# Patient Record
Sex: Female | Born: 1945 | Race: Black or African American | Hispanic: No | Smoking: Never smoker
Health system: Southern US, Community
[De-identification: ages and names within clinical notes are randomized; demographics above are authoritative.]

## PROBLEM LIST (undated history)

## (undated) DIAGNOSIS — E119 Type 2 diabetes mellitus without complications: Secondary | ICD-10-CM

## (undated) DIAGNOSIS — R911 Solitary pulmonary nodule: Secondary | ICD-10-CM

## (undated) DIAGNOSIS — M1990 Unspecified osteoarthritis, unspecified site: Secondary | ICD-10-CM

## (undated) DIAGNOSIS — E785 Hyperlipidemia, unspecified: Secondary | ICD-10-CM

## (undated) DIAGNOSIS — F419 Anxiety disorder, unspecified: Secondary | ICD-10-CM

## (undated) DIAGNOSIS — I493 Ventricular premature depolarization: Secondary | ICD-10-CM

## (undated) DIAGNOSIS — I5042 Chronic combined systolic (congestive) and diastolic (congestive) heart failure: Secondary | ICD-10-CM

## (undated) DIAGNOSIS — I10 Essential (primary) hypertension: Secondary | ICD-10-CM

## (undated) DIAGNOSIS — E669 Obesity, unspecified: Secondary | ICD-10-CM

## (undated) DIAGNOSIS — I350 Nonrheumatic aortic (valve) stenosis: Secondary | ICD-10-CM

## (undated) DIAGNOSIS — F329 Major depressive disorder, single episode, unspecified: Secondary | ICD-10-CM

## (undated) DIAGNOSIS — K5900 Constipation, unspecified: Secondary | ICD-10-CM

## (undated) DIAGNOSIS — N183 Chronic kidney disease, stage 3 unspecified: Secondary | ICD-10-CM

## (undated) DIAGNOSIS — I428 Other cardiomyopathies: Secondary | ICD-10-CM

## (undated) DIAGNOSIS — F32A Depression, unspecified: Secondary | ICD-10-CM

## (undated) DIAGNOSIS — I447 Left bundle-branch block, unspecified: Secondary | ICD-10-CM

## (undated) DIAGNOSIS — J189 Pneumonia, unspecified organism: Secondary | ICD-10-CM

## (undated) DIAGNOSIS — Z95810 Presence of automatic (implantable) cardiac defibrillator: Secondary | ICD-10-CM

## (undated) DIAGNOSIS — I5022 Chronic systolic (congestive) heart failure: Secondary | ICD-10-CM

## (undated) DIAGNOSIS — Z952 Presence of prosthetic heart valve: Secondary | ICD-10-CM

## (undated) HISTORY — DX: Chronic combined systolic (congestive) and diastolic (congestive) heart failure: I50.42

## (undated) HISTORY — DX: Essential (primary) hypertension: I10

## (undated) HISTORY — DX: Anxiety disorder, unspecified: F41.9

## (undated) HISTORY — DX: Solitary pulmonary nodule: R91.1

## (undated) HISTORY — DX: Presence of automatic (implantable) cardiac defibrillator: Z95.810

## (undated) HISTORY — DX: Obesity, unspecified: E66.9

## (undated) HISTORY — DX: Nonrheumatic aortic (valve) stenosis: I35.0

## (undated) HISTORY — DX: Left bundle-branch block, unspecified: I44.7

## (undated) HISTORY — DX: Hyperlipidemia, unspecified: E78.5

## (undated) HISTORY — DX: Other cardiomyopathies: I42.8

## (undated) HISTORY — DX: Chronic systolic (congestive) heart failure: I50.22

## (undated) HISTORY — DX: Type 2 diabetes mellitus without complications: E11.9

## (undated) HISTORY — DX: Chronic kidney disease, stage 3 unspecified: N18.30

## (undated) HISTORY — DX: Ventricular premature depolarization: I49.3

## (undated) HISTORY — DX: Chronic kidney disease, stage 3 (moderate): N18.3

## (undated) HISTORY — DX: Pneumonia, unspecified organism: J18.9

## (undated) HISTORY — PX: CARDIAC CATHETERIZATION: SHX172

## (undated) SURGERY — VIDEO BRONCHOSCOPY WITHOUT FLUORO
Anesthesia: Moderate Sedation

---

## 1985-08-15 HISTORY — PX: TUBAL LIGATION: SHX77

## 2002-03-07 ENCOUNTER — Emergency Department (HOSPITAL_COMMUNITY): Admission: EM | Admit: 2002-03-07 | Discharge: 2002-03-07 | Payer: Self-pay | Admitting: Emergency Medicine

## 2002-03-21 ENCOUNTER — Encounter: Payer: Self-pay | Admitting: Family Medicine

## 2002-03-21 ENCOUNTER — Ambulatory Visit (HOSPITAL_COMMUNITY): Admission: RE | Admit: 2002-03-21 | Discharge: 2002-03-21 | Payer: Self-pay | Admitting: Family Medicine

## 2003-05-02 ENCOUNTER — Ambulatory Visit (HOSPITAL_COMMUNITY): Admission: RE | Admit: 2003-05-02 | Discharge: 2003-05-02 | Payer: Self-pay | Admitting: Family Medicine

## 2003-05-02 ENCOUNTER — Encounter: Payer: Self-pay | Admitting: Family Medicine

## 2003-05-05 ENCOUNTER — Encounter: Payer: Self-pay | Admitting: Family Medicine

## 2003-05-05 ENCOUNTER — Ambulatory Visit (HOSPITAL_COMMUNITY): Admission: RE | Admit: 2003-05-05 | Discharge: 2003-05-05 | Payer: Self-pay | Admitting: Family Medicine

## 2003-05-18 ENCOUNTER — Encounter: Payer: Self-pay | Admitting: Emergency Medicine

## 2003-05-18 ENCOUNTER — Emergency Department (HOSPITAL_COMMUNITY): Admission: EM | Admit: 2003-05-18 | Discharge: 2003-05-18 | Payer: Self-pay | Admitting: Emergency Medicine

## 2003-05-27 ENCOUNTER — Encounter: Payer: Self-pay | Admitting: *Deleted

## 2003-05-27 ENCOUNTER — Ambulatory Visit (HOSPITAL_COMMUNITY): Admission: RE | Admit: 2003-05-27 | Discharge: 2003-05-27 | Payer: Self-pay | Admitting: *Deleted

## 2004-07-27 ENCOUNTER — Ambulatory Visit: Payer: Self-pay | Admitting: Family Medicine

## 2004-09-27 ENCOUNTER — Ambulatory Visit: Payer: Self-pay | Admitting: Family Medicine

## 2004-09-28 ENCOUNTER — Ambulatory Visit (HOSPITAL_COMMUNITY): Admission: RE | Admit: 2004-09-28 | Discharge: 2004-09-28 | Payer: Self-pay | Admitting: Family Medicine

## 2004-11-08 ENCOUNTER — Ambulatory Visit: Payer: Self-pay | Admitting: Family Medicine

## 2004-11-10 ENCOUNTER — Ambulatory Visit: Payer: Self-pay | Admitting: *Deleted

## 2004-11-15 ENCOUNTER — Ambulatory Visit (HOSPITAL_COMMUNITY): Admission: RE | Admit: 2004-11-15 | Discharge: 2004-11-15 | Payer: Self-pay | Admitting: *Deleted

## 2004-11-15 ENCOUNTER — Ambulatory Visit: Payer: Self-pay | Admitting: *Deleted

## 2004-11-24 ENCOUNTER — Ambulatory Visit: Payer: Self-pay | Admitting: *Deleted

## 2005-01-26 ENCOUNTER — Encounter: Payer: Self-pay | Admitting: Orthopedic Surgery

## 2005-02-25 ENCOUNTER — Ambulatory Visit (HOSPITAL_COMMUNITY): Admission: RE | Admit: 2005-02-25 | Discharge: 2005-02-25 | Payer: Self-pay | Admitting: Family Medicine

## 2005-02-25 ENCOUNTER — Ambulatory Visit: Payer: Self-pay | Admitting: Family Medicine

## 2005-06-21 ENCOUNTER — Ambulatory Visit: Payer: Self-pay | Admitting: Family Medicine

## 2005-08-18 ENCOUNTER — Ambulatory Visit: Payer: Self-pay | Admitting: Family Medicine

## 2006-01-10 ENCOUNTER — Ambulatory Visit: Payer: Self-pay | Admitting: Family Medicine

## 2006-02-12 ENCOUNTER — Encounter: Payer: Self-pay | Admitting: Family Medicine

## 2006-02-12 LAB — CONVERTED CEMR LAB: Pap Smear: NORMAL

## 2006-02-24 ENCOUNTER — Ambulatory Visit (HOSPITAL_COMMUNITY): Admission: RE | Admit: 2006-02-24 | Discharge: 2006-02-24 | Payer: Self-pay | Admitting: Family Medicine

## 2006-03-06 ENCOUNTER — Ambulatory Visit: Payer: Self-pay | Admitting: Family Medicine

## 2006-03-06 ENCOUNTER — Other Ambulatory Visit: Admission: RE | Admit: 2006-03-06 | Discharge: 2006-03-06 | Payer: Self-pay | Admitting: Family Medicine

## 2006-03-06 ENCOUNTER — Encounter: Payer: Self-pay | Admitting: Family Medicine

## 2006-05-03 ENCOUNTER — Ambulatory Visit (HOSPITAL_COMMUNITY): Admission: RE | Admit: 2006-05-03 | Discharge: 2006-05-03 | Payer: Self-pay | Admitting: Family Medicine

## 2006-06-06 ENCOUNTER — Ambulatory Visit: Payer: Self-pay | Admitting: Family Medicine

## 2006-08-22 ENCOUNTER — Ambulatory Visit: Payer: Self-pay | Admitting: Family Medicine

## 2007-01-29 ENCOUNTER — Ambulatory Visit: Payer: Self-pay | Admitting: Family Medicine

## 2007-06-27 ENCOUNTER — Ambulatory Visit: Payer: Self-pay | Admitting: Cardiovascular Disease

## 2007-08-01 ENCOUNTER — Ambulatory Visit: Payer: Self-pay | Admitting: Family Medicine

## 2007-08-13 ENCOUNTER — Ambulatory Visit (HOSPITAL_COMMUNITY): Admission: RE | Admit: 2007-08-13 | Discharge: 2007-08-13 | Payer: Self-pay | Admitting: Family Medicine

## 2007-08-13 ENCOUNTER — Ambulatory Visit: Payer: Self-pay | Admitting: Cardiovascular Disease

## 2007-08-13 ENCOUNTER — Encounter: Payer: Self-pay | Admitting: Family Medicine

## 2007-08-13 LAB — CONVERTED CEMR LAB
ALT: 15 units/L (ref 0–35)
AST: 17 units/L (ref 0–37)
Albumin: 4.4 g/dL (ref 3.5–5.2)
Alkaline Phosphatase: 61 units/L (ref 39–117)
BUN: 15 mg/dL (ref 6–23)
Bilirubin, Direct: <0.1 mg/dL (ref 0.0–0.3)
CO2: 23 meq/L (ref 19–32)
Calcium: 9.9 mg/dL (ref 8.4–10.5)
Chloride: 105 meq/L (ref 96–112)
Cholesterol: 301 mg/dL — ABNORMAL HIGH (ref 0–200)
Creatinine, Ser: 0.81 mg/dL (ref 0.40–1.20)
Glucose, Bld: 135 mg/dL — ABNORMAL HIGH (ref 70–99)
HDL: 54 mg/dL (ref >39)
LDL Cholesterol: 220 mg/dL — ABNORMAL HIGH (ref 0–99)
Microalb, Ur: 1.65 mg/dL (ref 0.00–1.89)
Potassium: 4.5 meq/L (ref 3.5–5.3)
Sodium: 143 meq/L (ref 135–145)
Total Bilirubin: 0.3 mg/dL (ref 0.3–1.2)
Total CHOL/HDL Ratio: 5.6
Total Protein: 7.6 g/dL (ref 6.0–8.3)
Triglycerides: 134 mg/dL (ref <150)
VLDL: 27 mg/dL (ref 0–40)

## 2007-08-20 ENCOUNTER — Ambulatory Visit: Payer: Self-pay | Admitting: Cardiology

## 2007-08-21 ENCOUNTER — Ambulatory Visit (HOSPITAL_COMMUNITY): Admission: RE | Admit: 2007-08-21 | Discharge: 2007-08-21 | Payer: Self-pay | Admitting: Cardiovascular Disease

## 2007-09-18 ENCOUNTER — Ambulatory Visit: Payer: Self-pay | Admitting: Family Medicine

## 2007-10-24 ENCOUNTER — Encounter: Payer: Self-pay | Admitting: Family Medicine

## 2007-10-24 DIAGNOSIS — E1121 Type 2 diabetes mellitus with diabetic nephropathy: Secondary | ICD-10-CM | POA: Insufficient documentation

## 2007-10-24 DIAGNOSIS — E669 Obesity, unspecified: Secondary | ICD-10-CM | POA: Insufficient documentation

## 2007-10-24 DIAGNOSIS — M1710 Unilateral primary osteoarthritis, unspecified knee: Secondary | ICD-10-CM

## 2007-10-24 DIAGNOSIS — E663 Overweight: Secondary | ICD-10-CM | POA: Insufficient documentation

## 2007-10-24 DIAGNOSIS — E119 Type 2 diabetes mellitus without complications: Secondary | ICD-10-CM | POA: Insufficient documentation

## 2007-10-24 DIAGNOSIS — IMO0002 Reserved for concepts with insufficient information to code with codable children: Secondary | ICD-10-CM | POA: Insufficient documentation

## 2007-10-24 DIAGNOSIS — Z6826 Body mass index (BMI) 26.0-26.9, adult: Secondary | ICD-10-CM | POA: Insufficient documentation

## 2007-11-06 ENCOUNTER — Ambulatory Visit: Payer: Self-pay | Admitting: Family Medicine

## 2007-11-13 ENCOUNTER — Ambulatory Visit: Payer: Self-pay | Admitting: Orthopedic Surgery

## 2007-11-13 DIAGNOSIS — M25569 Pain in unspecified knee: Secondary | ICD-10-CM | POA: Insufficient documentation

## 2007-12-26 ENCOUNTER — Encounter: Payer: Self-pay | Admitting: Family Medicine

## 2007-12-26 ENCOUNTER — Ambulatory Visit: Payer: Self-pay | Admitting: Family Medicine

## 2007-12-26 ENCOUNTER — Other Ambulatory Visit: Admission: RE | Admit: 2007-12-26 | Discharge: 2007-12-26 | Payer: Self-pay | Admitting: Family Medicine

## 2007-12-27 ENCOUNTER — Encounter: Payer: Self-pay | Admitting: Family Medicine

## 2007-12-27 LAB — CONVERTED CEMR LAB
ALT: 13 units/L (ref 0–35)
AST: 12 units/L (ref 0–37)
Albumin: 4.3 g/dL (ref 3.5–5.2)
Alkaline Phosphatase: 61 units/L (ref 39–117)
BUN: 13 mg/dL (ref 6–23)
Bilirubin, Direct: 0.1 mg/dL (ref 0.0–0.3)
CO2: 23 meq/L (ref 19–32)
Calcium: 9.4 mg/dL (ref 8.4–10.5)
Chloride: 106 meq/L (ref 96–112)
Cholesterol: 187 mg/dL (ref 0–200)
Creatinine, Ser: 0.68 mg/dL (ref 0.40–1.20)
Glucose, Bld: 143 mg/dL — ABNORMAL HIGH (ref 70–99)
HDL: 50 mg/dL (ref >39)
Indirect Bilirubin: 0.2 mg/dL (ref 0.0–0.9)
LDL Cholesterol: 113 mg/dL — ABNORMAL HIGH (ref 0–99)
Potassium: 4.1 meq/L (ref 3.5–5.3)
Sodium: 141 meq/L (ref 135–145)
Total Bilirubin: 0.3 mg/dL (ref 0.3–1.2)
Total CHOL/HDL Ratio: 3.7
Total Protein: 7.4 g/dL (ref 6.0–8.3)
Triglycerides: 119 mg/dL (ref <150)
VLDL: 24 mg/dL (ref 0–40)

## 2008-03-27 ENCOUNTER — Telehealth: Payer: Self-pay | Admitting: Family Medicine

## 2008-05-22 ENCOUNTER — Ambulatory Visit: Payer: Self-pay | Admitting: Family Medicine

## 2008-05-22 LAB — CONVERTED CEMR LAB
Glucose, Bld: 151 mg/dL
Hgb A1c MFr Bld: 9.2 %

## 2008-05-29 ENCOUNTER — Encounter: Payer: Self-pay | Admitting: Family Medicine

## 2008-07-02 ENCOUNTER — Emergency Department (HOSPITAL_COMMUNITY): Admission: EM | Admit: 2008-07-02 | Discharge: 2008-07-02 | Payer: Self-pay | Admitting: Emergency Medicine

## 2008-07-11 ENCOUNTER — Ambulatory Visit: Payer: Self-pay | Admitting: Cardiology

## 2008-07-14 ENCOUNTER — Encounter: Payer: Self-pay | Admitting: Family Medicine

## 2008-07-22 ENCOUNTER — Telehealth: Payer: Self-pay | Admitting: Family Medicine

## 2008-07-23 ENCOUNTER — Encounter: Payer: Self-pay | Admitting: Family Medicine

## 2008-08-27 ENCOUNTER — Telehealth: Payer: Self-pay | Admitting: Family Medicine

## 2008-11-07 ENCOUNTER — Encounter: Payer: Self-pay | Admitting: Orthopedic Surgery

## 2008-12-03 ENCOUNTER — Ambulatory Visit: Payer: Self-pay | Admitting: Family Medicine

## 2008-12-03 LAB — CONVERTED CEMR LAB
Glucose, Bld: 142 mg/dL
Hgb A1c MFr Bld: 7.1 %

## 2008-12-04 ENCOUNTER — Encounter: Payer: Self-pay | Admitting: Family Medicine

## 2008-12-10 ENCOUNTER — Encounter: Payer: Self-pay | Admitting: Family Medicine

## 2009-01-20 ENCOUNTER — Encounter: Payer: Self-pay | Admitting: Family Medicine

## 2009-03-19 ENCOUNTER — Encounter: Payer: Self-pay | Admitting: Family Medicine

## 2009-03-24 ENCOUNTER — Encounter: Payer: Self-pay | Admitting: Family Medicine

## 2009-03-25 ENCOUNTER — Encounter: Payer: Self-pay | Admitting: Family Medicine

## 2009-05-08 ENCOUNTER — Telehealth: Payer: Self-pay | Admitting: Family Medicine

## 2009-05-13 ENCOUNTER — Ambulatory Visit: Payer: Self-pay | Admitting: Cardiology

## 2009-05-13 DIAGNOSIS — R002 Palpitations: Secondary | ICD-10-CM | POA: Insufficient documentation

## 2009-05-20 ENCOUNTER — Encounter: Payer: Self-pay | Admitting: Family Medicine

## 2009-05-27 ENCOUNTER — Encounter: Payer: Self-pay | Admitting: Family Medicine

## 2009-05-28 ENCOUNTER — Ambulatory Visit: Payer: Self-pay | Admitting: Family Medicine

## 2009-05-28 LAB — CONVERTED CEMR LAB
ALT: 14 units/L (ref 0–35)
AST: 15 units/L (ref 0–37)
Albumin: 4.3 g/dL (ref 3.5–5.2)
Alkaline Phosphatase: 45 units/L (ref 39–117)
BUN: 14 mg/dL (ref 6–23)
Basophils Absolute: 0.1 10*3/uL (ref 0.0–0.1)
Basophils Relative: 1 % (ref 0–1)
Bilirubin, Direct: 0.1 mg/dL (ref 0.0–0.3)
CO2: 26 meq/L (ref 19–32)
Calcium: 9.6 mg/dL (ref 8.4–10.5)
Chloride: 105 meq/L (ref 96–112)
Cholesterol: 182 mg/dL (ref 0–200)
Creatinine, Ser: 0.71 mg/dL (ref 0.40–1.20)
Creatinine, Urine: 52.4 mg/dL
Eosinophils Absolute: 0.2 10*3/uL (ref 0.0–0.7)
Eosinophils Relative: 3 % (ref 0–5)
Glucose, Bld: 105 mg/dL — ABNORMAL HIGH (ref 70–99)
Glucose, Bld: 92 mg/dL
HCT: 34.9 % — ABNORMAL LOW (ref 36.0–46.0)
HDL: 60 mg/dL (ref >39)
Hemoglobin: 10.8 g/dL — ABNORMAL LOW (ref 12.0–15.0)
Hgb A1c MFr Bld: 6.8 %
Indirect Bilirubin: 0.1 mg/dL (ref 0.0–0.9)
LDL Cholesterol: 104 mg/dL — ABNORMAL HIGH (ref 0–99)
Lymphocytes Relative: 40 % (ref 12–46)
Lymphs Abs: 2.2 10*3/uL (ref 0.7–4.0)
MCHC: 30.9 g/dL (ref 30.0–36.0)
MCV: 85.7 fL (ref 78.0–100.0)
Microalb Creat Ratio: 32.1 mg/g — ABNORMAL HIGH (ref 0.0–30.0)
Microalb, Ur: 1.68 mg/dL (ref 0.00–1.89)
Monocytes Absolute: 0.5 10*3/uL (ref 0.1–1.0)
Monocytes Relative: 9 % (ref 3–12)
Neutro Abs: 2.6 10*3/uL (ref 1.7–7.7)
Neutrophils Relative %: 47 % (ref 43–77)
Platelets: 276 10*3/uL (ref 150–400)
Potassium: 4.1 meq/L (ref 3.5–5.3)
RBC: 4.07 M/uL (ref 3.87–5.11)
RDW: 16.3 % — ABNORMAL HIGH (ref 11.5–15.5)
Sodium: 141 meq/L (ref 135–145)
Total Bilirubin: 0.2 mg/dL — ABNORMAL LOW (ref 0.3–1.2)
Total CHOL/HDL Ratio: 3
Total Protein: 7.3 g/dL (ref 6.0–8.3)
Triglycerides: 90 mg/dL (ref <150)
VLDL: 18 mg/dL (ref 0–40)
WBC: 5.6 10*3/uL (ref 4.0–10.5)

## 2009-05-29 ENCOUNTER — Encounter: Payer: Self-pay | Admitting: Family Medicine

## 2009-06-01 ENCOUNTER — Encounter: Payer: Self-pay | Admitting: Family Medicine

## 2009-06-05 ENCOUNTER — Encounter: Payer: Self-pay | Admitting: Family Medicine

## 2009-06-15 ENCOUNTER — Encounter: Payer: Self-pay | Admitting: Family Medicine

## 2009-06-17 ENCOUNTER — Telehealth: Payer: Self-pay | Admitting: Family Medicine

## 2009-06-19 ENCOUNTER — Telehealth: Payer: Self-pay | Admitting: Family Medicine

## 2009-08-05 ENCOUNTER — Encounter: Payer: Self-pay | Admitting: Family Medicine

## 2009-08-12 ENCOUNTER — Encounter: Payer: Self-pay | Admitting: Family Medicine

## 2009-09-09 ENCOUNTER — Encounter: Payer: Self-pay | Admitting: Family Medicine

## 2009-10-29 ENCOUNTER — Encounter: Payer: Self-pay | Admitting: Family Medicine

## 2009-12-21 ENCOUNTER — Telehealth: Payer: Self-pay | Admitting: Orthopedic Surgery

## 2010-03-01 ENCOUNTER — Encounter: Payer: Self-pay | Admitting: Family Medicine

## 2010-03-02 ENCOUNTER — Encounter: Payer: Self-pay | Admitting: Family Medicine

## 2010-03-09 ENCOUNTER — Encounter: Payer: Self-pay | Admitting: Family Medicine

## 2010-03-23 ENCOUNTER — Encounter: Payer: Self-pay | Admitting: Family Medicine

## 2010-04-05 ENCOUNTER — Encounter: Payer: Self-pay | Admitting: Family Medicine

## 2010-04-06 ENCOUNTER — Telehealth: Payer: Self-pay | Admitting: Family Medicine

## 2010-04-28 ENCOUNTER — Ambulatory Visit: Payer: Self-pay | Admitting: Family Medicine

## 2010-04-28 DIAGNOSIS — R5381 Other malaise: Secondary | ICD-10-CM | POA: Insufficient documentation

## 2010-04-28 DIAGNOSIS — R5383 Other fatigue: Secondary | ICD-10-CM

## 2010-05-02 DIAGNOSIS — F3289 Other specified depressive episodes: Secondary | ICD-10-CM | POA: Insufficient documentation

## 2010-05-02 DIAGNOSIS — F329 Major depressive disorder, single episode, unspecified: Secondary | ICD-10-CM | POA: Insufficient documentation

## 2010-05-07 ENCOUNTER — Encounter: Payer: Self-pay | Admitting: Family Medicine

## 2010-05-26 ENCOUNTER — Telehealth: Payer: Self-pay | Admitting: Family Medicine

## 2010-05-28 LAB — CONVERTED CEMR LAB
ALT: 13 units/L (ref 0–35)
AST: 11 units/L (ref 0–37)
Albumin: 4.5 g/dL (ref 3.5–5.2)
Alkaline Phosphatase: 50 units/L (ref 39–117)
BUN: 20 mg/dL (ref 6–23)
Basophils Absolute: 0.1 10*3/uL (ref 0.0–0.1)
Basophils Relative: 1 % (ref 0–1)
Bilirubin, Direct: 0.1 mg/dL (ref 0.0–0.3)
CO2: 25 meq/L (ref 19–32)
Calcium: 10.3 mg/dL (ref 8.4–10.5)
Chloride: 101 meq/L (ref 96–112)
Cholesterol: 150 mg/dL (ref 0–200)
Creatinine, Ser: 0.87 mg/dL (ref 0.40–1.20)
Eosinophils Absolute: 0.1 10*3/uL (ref 0.0–0.7)
Eosinophils Relative: 2 % (ref 0–5)
Glucose, Bld: 163 mg/dL — ABNORMAL HIGH (ref 70–99)
HCT: 37.5 % (ref 36.0–46.0)
HDL: 38 mg/dL — ABNORMAL LOW (ref >39)
Hemoglobin: 11.8 g/dL — ABNORMAL LOW (ref 12.0–15.0)
Hgb A1c MFr Bld: 9 % — ABNORMAL HIGH (ref <5.7)
Indirect Bilirubin: 0.3 mg/dL (ref 0.0–0.9)
LDL Cholesterol: 85 mg/dL (ref 0–99)
Lymphocytes Relative: 40 % (ref 12–46)
Lymphs Abs: 2.1 10*3/uL (ref 0.7–4.0)
MCHC: 31.5 g/dL (ref 30.0–36.0)
MCV: 78.8 fL (ref 78.0–100.0)
Monocytes Absolute: 0.5 10*3/uL (ref 0.1–1.0)
Monocytes Relative: 9 % (ref 3–12)
Neutro Abs: 2.5 10*3/uL (ref 1.7–7.7)
Neutrophils Relative %: 48 % (ref 43–77)
Platelets: 306 10*3/uL (ref 150–400)
Potassium: 4.1 meq/L (ref 3.5–5.3)
RBC: 4.76 M/uL (ref 3.87–5.11)
RDW: 17.1 % — ABNORMAL HIGH (ref 11.5–15.5)
Sodium: 140 meq/L (ref 135–145)
TSH: 2.632 microintl units/mL (ref 0.350–4.500)
Total Bilirubin: 0.4 mg/dL (ref 0.3–1.2)
Total CHOL/HDL Ratio: 3.9
Total Protein: 7.8 g/dL (ref 6.0–8.3)
Triglycerides: 134 mg/dL (ref <150)
VLDL: 27 mg/dL (ref 0–40)
WBC: 5.3 10*3/uL (ref 4.0–10.5)

## 2010-06-01 ENCOUNTER — Ambulatory Visit (HOSPITAL_COMMUNITY): Admission: RE | Admit: 2010-06-01 | Discharge: 2010-06-01 | Payer: Self-pay | Admitting: Family Medicine

## 2010-06-15 ENCOUNTER — Other Ambulatory Visit: Admission: RE | Admit: 2010-06-15 | Discharge: 2010-06-15 | Payer: Self-pay | Admitting: Family Medicine

## 2010-06-15 ENCOUNTER — Ambulatory Visit: Payer: Self-pay | Admitting: Family Medicine

## 2010-06-15 DIAGNOSIS — H05019 Cellulitis of unspecified orbit: Secondary | ICD-10-CM | POA: Insufficient documentation

## 2010-06-15 LAB — HM PAP SMEAR

## 2010-06-15 LAB — HM DIABETES FOOT EXAM

## 2010-06-15 LAB — CONVERTED CEMR LAB: OCCULT 1: NEGATIVE

## 2010-06-18 ENCOUNTER — Encounter: Payer: Self-pay | Admitting: Family Medicine

## 2010-06-18 LAB — CONVERTED CEMR LAB: Pap Smear: NEGATIVE

## 2010-06-24 ENCOUNTER — Telehealth (INDEPENDENT_AMBULATORY_CARE_PROVIDER_SITE_OTHER): Payer: Self-pay | Admitting: *Deleted

## 2010-06-27 ENCOUNTER — Telehealth: Payer: Self-pay | Admitting: Family Medicine

## 2010-07-01 ENCOUNTER — Encounter (INDEPENDENT_AMBULATORY_CARE_PROVIDER_SITE_OTHER): Payer: Self-pay | Admitting: *Deleted

## 2010-07-16 ENCOUNTER — Emergency Department (HOSPITAL_COMMUNITY)
Admission: EM | Admit: 2010-07-16 | Discharge: 2010-07-16 | Payer: Self-pay | Source: Home / Self Care | Admitting: Emergency Medicine

## 2010-08-25 ENCOUNTER — Encounter: Payer: Self-pay | Admitting: Family Medicine

## 2010-09-04 ENCOUNTER — Encounter: Payer: Self-pay | Admitting: Family Medicine

## 2010-09-05 ENCOUNTER — Encounter: Payer: Self-pay | Admitting: Family Medicine

## 2010-09-05 ENCOUNTER — Encounter: Payer: Self-pay | Admitting: Cardiovascular Disease

## 2010-09-06 ENCOUNTER — Encounter: Payer: Self-pay | Admitting: Family Medicine

## 2010-09-14 NOTE — Letter (Signed)
Summary: rx assistance  rx assistance   Imported By: Dierdre Harness 03/02/2010 11:00:35  _____________________________________________________________________  External Attachment:    Type:   Image     Comment:   External Document

## 2010-09-14 NOTE — Letter (Signed)
Summary: Appointment - Missed  [facility] [place]. [address] [place] [address]   Phone: [phone]  Fax: [phone]     July 01, 2010 MRN: [id number]   [place], [place]  [address]   Dear Ms. Finan,  [facility] records indicate you missed your appointment on       07/01/10 Jory Sims NP          It is very important that we reach you to reschedule this appointment. We look forward to participating in your health care needs. Please contact us at the number listed above at your earliest convenience to reschedule this appointment.     Sincerely,    Public relations account executive

## 2010-09-14 NOTE — Assessment & Plan Note (Signed)
Summary: per dr diabetic   Vital Signs:  Patient profile:   65 year old female Menstrual status:  postmenopausal Height:      65 inches Weight:      210.50 pounds BMI:     35.16 O2 Sat:      97 % Pulse rate:   72 / minute Pulse rhythm:   regular Resp:     16 per minute BP sitting:   150 / 90  (left arm) Cuff size:   large  Vitals Entered By: Kate Sable LPN (September 14, 624THL 7:58 AM)  Nutrition Counseling: Patient's BMI is greater than 25 and therefore counseled on weight management options. CC: Follow up chronic problems   Primary Care Provider:  Dr.Margaret Moshe Cipro  CC:  Follow up chronic problems.  History of Present Illness: pt reports she has not been well. She ius under alot of stress from family issues and her husband's continud ill health. She denies any recenrt fever or chills or head or chest congestion, dysuria or frequency. She is experiencing a sense of being overwhelmed, and feels depressed and is often tearful.She is not suicidal or homicidal and is not hallucinating   Current Medications (verified): 1)  Metoprolol Succinate 25 Mg  Tb24 (Metoprolol Succinate) .... Take One and One Half Tablet By Mouth  Twice Daily 2)  Glipizide Xl 10 Mg  Tb24 (Glipizide) .... Two Tabs By Mouth Once Daily 3)  Azor 10-40 Mg Tabs (Amlodipine-Olmesartan) .... Take 1 Tablet By Mouth Once A Day 4)  Lorazepam 1 Mg Tabs (Lorazepam) .... One Tab By Mouth Qhs 5)  Aspirin 325 Mg Tabs (Aspirin) .... Take 1 Tab Daily 6)  Chlorthalidone 25 Mg Tabs (Chlorthalidone) .... Take 1/2 Tablet (12.5mg ) By Mouth Once Daily 7)  Crestor 20 Mg Tabs (Rosuvastatin Calcium) .... Take 1 Tab By Mouth At Bedtime 8)  Janumet 50-1000 Mg Tabs (Sitagliptin-Metformin Hcl) .... Take 1 Tablet By Mouth Two Times A Day  Allergies (verified): No Known Drug Allergies  Review of Systems      See HPI General:  Complains of fatigue and sleep disorder. Eyes:  Denies discharge, eye pain, and red eye. CV:  Denies  chest pain or discomfort, palpitations, and swelling of feet. GI:  Denies abdominal pain, constipation, diarrhea, nausea, and vomiting. GU:  Denies dysuria and urinary frequency. MS:  Complains of joint pain and stiffness; left knee pain and instability, has not fallen was told at one time that she needed replacement. Derm:  Denies itching and rash. Neuro:  Denies headaches. Psych:  Complains of anxiety, depression, and easily tearful; denies sense of great danger, suicidal thoughts/plans, and thoughts of violence. Endo:  Denies excessive thirst and excessive urination. Heme:  Denies abnormal bruising and bleeding. Allergy:  Complains of seasonal allergies.  Physical Exam  General:  Well-developed,well-nourished,in no acute distress; alert,appropriate and cooperative throughout examination. Flat affrect and tearful, looks worried and anxious. HEENT: No facial asymmetry,  EOMI, No sinus tenderness, TM's Clear, oropharynx  pink and moist.   Chest: Clear to auscultation bilaterally.  CVS: S1, S2,murmur, No S3.   Abd: Soft, Nontender.  MS: Adequate ROM spine, hips, shoulders and knees.  Ext: No edema.   CNS: CN 2-12 intact, power tone and sensation normal throughout.   Skin: Intact, no visible lesions or rashes.  Psych: Good eye contact,  Diabetes Management Exam:    Foot Exam (with socks and/or shoes not present):       Sensory-Monofilament:  Left foot: diminished          Right foot: diminished       Inspection:          Left foot: normal          Right foot: normal       Nails:          Left foot: thickened          Right foot: thickened   Impression & Recommendations:  Problem # 1:  HYPERTENSION (ICD-401.9) Assessment Deteriorated  Her updated medication list for this problem includes:    Metoprolol Succinate 25 Mg Tb24 (Metoprolol succinate) .Marland Kitchen... Take one and one half tablet by mouth  twice daily    Azor 10-40 Mg Tabs (Amlodipine-olmesartan) .Marland Kitchen... Take 1 tablet  by mouth once a day    Chlorthalidone 25 Mg Tabs (Chlorthalidone) .Marland Kitchen... Take 1/2 tablet (12.5mg ) by mouth once daily  Orders: T-Basic Metabolic Panel ([id number])  BP today: 150/90 Prior BP: 140/80 (05/28/2009)  Labs Reviewed: K+: 4.1 (05/27/2009) Creat: : 0.71 (05/27/2009)   Chol: 182 (05/27/2009)   HDL: 60 (05/27/2009)   LDL: 104 (05/27/2009)   TG: 90 (05/27/2009)  Problem # 2:  HYPERLIPIDEMIA (ICD-272.4) Assessment: Comment Only  The following medications were removed from the medication list:    Crestor 40 Mg Tabs (Rosuvastatin calcium) .Marland Kitchen... Take 1 tab by mouth at bedtime Her updated medication list for this problem includes:    Crestor 20 Mg Tabs (Rosuvastatin calcium) .Marland Kitchen... Take 1 tab by mouth at bedtime Low fat dietdiscussed and encouraged  Orders: T-Hepatic Function [id number]) T-Lipid Profile [phone])  Labs Reviewed: SGOT: 15 (05/27/2009)   SGPT: 14 (05/27/2009)   HDL:60 (05/27/2009), 50 (12/27/2007)  LDL:104 (05/27/2009), 113 (12/27/2007)  Chol:182 (05/27/2009), 187 (12/27/2007)  Trig:90 (05/27/2009), 119 (12/27/2007)  Problem # 3:  DIABETES MELLITUS, TYPE II (ICD-250.00) Assessment: Comment Only  Her updated medication list for this problem includes:    Glipizide Xl 10 Mg Tb24 (Glipizide) .Marland Kitchen..Marland Kitchen Two tabs by mouth once daily    Azor 10-40 Mg Tabs (Amlodipine-olmesartan) .Marland Kitchen... Take 1 tablet by mouth once a day    Aspirin 325 Mg Tabs (Aspirin) .Marland Kitchen... Take 1 tab daily    Janumet 50-1000 Mg Tabs (Sitagliptin-metformin hcl) .Marland Kitchen... Take 1 tablet by mouth two times a day Patient advised to reduce carbs and sweets, commit to regular physical activity, take meds as prescribed, test blood sugars as directed, and attempt to lose weight , to improve blood sugar control.  Orders: T- Hemoglobin A1C [id number])  Labs Reviewed: Creat: 0.71 (05/27/2009)    Reviewed HgBA1c results: 6.8 (05/28/2009)  7.1 (12/03/2008)  Problem # 4:  OBESITY, UNSPECIFIED  (ICD-278.00) Assessment: Improved  Ht: 65 (04/28/2010)   Wt: 210.50 (04/28/2010)   BMI: 35.16 (04/28/2010)  Problem # 5:  DEPRESSION (ICD-311) Assessment: Deteriorated  Her updated medication list for this problem includes:    Lorazepam 1 Mg Tabs (Lorazepam) ..... One tab by mouth qhs    Fluoxetine Hcl 10 Mg Caps (Fluoxetine hcl) .Marland Kitchen... Take 1 capsule by mouth once a day  Complete Medication List: 1)  Metoprolol Succinate 25 Mg Tb24 (Metoprolol succinate) .... Take one and one half tablet by mouth  twice daily 2)  Glipizide Xl 10 Mg Tb24 (Glipizide) .... Two tabs by mouth once daily 3)  Azor 10-40 Mg Tabs (Amlodipine-olmesartan) .... Take 1 tablet by mouth once a day 4)  Lorazepam 1 Mg Tabs (Lorazepam) .... One tab by mouth qhs 5)  Aspirin 325 Mg  Tabs (Aspirin) .... Take 1 tab daily 6)  Chlorthalidone 25 Mg Tabs (Chlorthalidone) .... Take 1/2 tablet (12.5mg ) by mouth once daily 7)  Crestor 20 Mg Tabs (Rosuvastatin calcium) .... Take 1 tab by mouth at bedtime 8)  Janumet 50-1000 Mg Tabs (Sitagliptin-metformin hcl) .... Take 1 tablet by mouth two times a day 9)  Fluoxetine Hcl 10 Mg Caps (Fluoxetine hcl) .... Take 1 capsule by mouth once a day  Other Orders: T-CBC w/Diff [id number]) T-TSH [phone]) Radiology Referral (Radiology)  Patient Instructions: 1)  CPE first or 2nd week in October. 2)  [place] to be scheduled for OCt, we will sched. 3)  BMP prior to visit, ICD-9: 4)  Hepatic Panel prior to visit, ICD-9: 5)  Lipid Panel prior to visit, ICD-9: 6)  TSH prior to visit, ICD-9:   fasting first week in October 7)  CBC w/ Diff prior to visit, ICD-9: 8)  HbgA1C prior to visit, ICD-9: 9)  You will be started on med for depression Prescriptions: FLUOXETINE HCL 10 MG CAPS (FLUOXETINE HCL) Take 1 capsule by mouth once a day  #30 x 0   Entered by:   Kate Sable LPN   Authorized by:   [name] MD   Signed by:   Kate Sable LPN on [id number]   Method used:   Electronically  to        [facility] (retail)       [place] [address]       [place], [place]  [address]       Ph: [phone]       Fax: [phone]   RxID:   [id number] FLUOXETINE HCL 10 MG CAPS (FLUOXETINE HCL) Take 1 capsule by mouth once a day  #302 x 0   Entered and Authorized by:   [name] MD   Signed by:   [name] MD on 04/28/2010   Method used:   Electronically to        [facility] (retail)       [address]       [place], [place]  [address]       Ph: [phone]       Fax: [phone]   RxID:   [id number]

## 2010-09-14 NOTE — Progress Notes (Signed)
Summary: rx  Phone Note Call from Patient   Summary of Call: wants to know why med hasn't been called in. [phone] Initial call taken by: [name],  June 19, 2009 11:44 AM  Follow-up for Phone Call        Called patient, no answer. Pharmacy had called yesterday to verify strength of meds and that may be the hold up. Will try to contact patient later about this matter. Follow-up by: Kate Sable,  June 19, 2009 1:15 PM  Additional Follow-up for Phone Call Additional follow up Details #1::        was wanting her anxiety meds,.sent to pharmacy and patient aware Additional Follow-up by: Kate Sable,  June 19, 2009 3:33 PM    Prescriptions: LORAZEPAM 1 MG TABS (LORAZEPAM) ONE TAB by mouth QHS  #30 x 2   Entered by:   Kate Sable   Authorized by:   [name] MD   Signed by:   Kate Sable on 06/19/2009   Method used:   Printed then faxed to ...       [facility]  [address] (retail)       [address] [place], [place]  [address]       Ph: [phone]       Fax: [phone]   RxID:   [id number]

## 2010-09-14 NOTE — Letter (Signed)
Summary: RX ASSISTANCE  RX ASSISTANCE   Imported By: Dierdre Harness 03/23/2010 09:16:38  _____________________________________________________________________  External Attachment:    Type:   Image     Comment:   External Document

## 2010-09-14 NOTE — Progress Notes (Signed)
Summary: request for note   Phone Note Call from Patient   Caller: Daughter Summary of Call: Patient's daughter called on behalf of mom to request a note for patient to exercise at the [facility], states discussed per last visit with Dr Aline Brochure (in 2009). Please advise.   Patient phone is [phone] Initial call taken by: Ihor Austin,  Dec 21, 2009 12:57 PM  Follow-up for Phone Call        ???? wants a note to say to exercise   i cant tell her where to go???? Follow-up by: Arther Abbott MD,  Dec 21, 2009 1:06 PM  Additional Follow-up for Phone Call Additional follow up Details #1::        Advised patient unable to do at this time. Last seen in  2009; states may call back to schedule appointment. Additional Follow-up by: Ihor Austin,  Dec 21, 2009 1:56 PM

## 2010-09-14 NOTE — Progress Notes (Signed)
Summary: REFERRELL  Phone Note Call from Patient   Summary of Call: WANTS TO GET A REFERRELL  FOR DR. Lucia Gaskins  [address] DR[address]  CALL BACK AT [phone] Initial call taken by: Dierdre Harness,  April 06, 2010 11:06 AM  Follow-up for Phone Call        needs appt in this office first has not been in this year! Follow-up by: [name] MD,  April 06, 2010 11:55 AM  Additional Follow-up for Phone Call Additional follow up Details #1::        pt has appt for 05/25/2010. pt was called  Additional Follow-up by: [name],  April 07, 2010 10:08 AM

## 2010-09-14 NOTE — Letter (Signed)
Summary: Pap Smear, Normal Letter, [facility]  [address]   [place], [place] [address]   Phone: [phone]  Fax: [phone]          June 18, 2010    Dear: [name] [name]    I am pleased to notify you that your PAP smear was normal.  You will need your next PAP smear in:     ____ 3 Months    ____ 6 Months    __***__ 12 Months    Please call the office at our office number above, to schedule your next appointment.    Sincerely,    Baldomero Lamy, LPN [facility]

## 2010-09-14 NOTE — Progress Notes (Signed)
Summary: pain in teeth  Phone Note Call from Patient   Summary of Call: Having pain teeth and would like to get antibotic and also some samples. [phone] [facility] Initial call taken by: [name],  March 27, 2008 11:48 AM  Follow-up for Phone Call        Roselyn Reef advise pt I will send a presrioption of penicillin to her pharmacy,also she needs to see dentist.  Provide and document any samples we can help her with Follow-up by: [name] MD,  March 27, 2008 1:58 PM  Additional Follow-up for Phone Call Additional follow up Details #1::        Phone Call Completed Additional Follow-up by: Jimmey Ralph LPN,  August 13, [id number] 4:34 PM    New/Updated Medications: PENICILLIN V POTASSIUM 500 MG  TABS (PENICILLIN V POTASSIUM) Take 1 tablet by mouth three times a day   Prescriptions: PENICILLIN V POTASSIUM 500 MG  TABS (PENICILLIN V POTASSIUM) Take 1 tablet by mouth three times a day  #30 x 0   Entered and Authorized by:   [name] MD   Signed by:   [name] MD on 03/27/2008   Method used:   Electronically sent to ...       [facility]  [address]       [place], [place]  [address]       Ph: [phone]       Fax: [phone]   RxID:   [id number]

## 2010-09-14 NOTE — Progress Notes (Signed)
Summary: rx  Phone Note Call from Patient   Summary of Call: needs med faxed into health dept. [id number] Initial call taken by: [name],  August 27, 2008 9:08 AM  Follow-up for Phone Call        needs toprol script faxed to health dept. also needs directions on avandamet, patient states two tabs by mouth two times a day but her bottle states one tab by mouth bid Follow-up by: Jimmey Ralph LPN,  January 13, 624THL 9:26 AM  Additional Follow-up for Phone Call Additional follow up Details #1::        the rx is in your folder to be sent. pt was on avandamet at half the strength tab most recently prescribed, hence the presumed discrepancy, chek the med list in the chart, you will see this, questions, let me know Additional Follow-up by: [name] MD,  August 28, 2008 8:13 AM    Additional Follow-up for Phone Call Additional follow up Details #2::    RX FAXED TO HEALTH DEPT. Follow-up by: Jimmey Ralph LPN,  January 14, 624THL 11:20 AM

## 2010-09-14 NOTE — Medication Information (Signed)
Summary: Visual merchandiser   Imported By: Dierdre Harness 09/09/2009 08:49:40  _____________________________________________________________________  External Attachment:    Type:   Image     Comment:   External Document

## 2010-09-14 NOTE — Progress Notes (Signed)
Summary: LAB ORDER  Phone Note Call from Patient   Summary of Call: FAX OVER HER LAB ORDER GOING IN THE MORNING Initial call taken by: Dierdre Harness,  May 26, 2010 11:11 AM  Follow-up for Phone Call        Faxed to the lab  Follow-up by: Kate Sable LPN,  October 12, 624THL 11:20 AM

## 2010-09-14 NOTE — Medication Information (Signed)
Summary: janumet tab  janumet tab   Imported By: Eliezer Mccoy 03/16/2010 13:32:08  _____________________________________________________________________  External Attachment:    Type:   Image     Comment:   External Document

## 2010-09-14 NOTE — Letter (Signed)
Summary: Letter  Letter   Imported By: Dierdre Harness 12/04/2008 09:37:24  _____________________________________________________________________  External Attachment:    Type:   Image     Comment:   External Document

## 2010-09-14 NOTE — Letter (Signed)
Summary: rx assistance  rx assistance   Imported By: Dierdre Harness 03/01/2010 10:18:29  _____________________________________________________________________  External Attachment:    Type:   Image     Comment:   External Document

## 2010-09-14 NOTE — Assessment & Plan Note (Signed)
Summary: office visit   Vital Signs:  Patient profile:   65 year old female Menstrual status:  postmenopausal Height:      65 inches Weight:      227.38 pounds BMI:     37.97 Pulse rate:   68 / minute Pulse rhythm:   regular Resp:     16 per minute BP sitting:   140 / 80  (left arm)  Vitals Entered By: Jimmey Ralph LPN (October 14, 624THL 11:14 AM)  Nutrition Counseling: Patient's BMI is greater than 25 and therefore counseled on weight management options. CC: follow-up visit Is Patient Diabetic? Yes  Pain Assessment Patient in pain? no        Primary Care Provider:  Dr.Merdis Snodgrass Moshe Cipro  CC:  follow-up visit.  History of Present Illness: Reports  that she has been doing fairly well. Denies recent fever or chills. Denies sinus pressure, nasal congestion , ear pain or sore throat. Denies chest congestion, or cough productive of sputum. Denies chest pain, palpitations, PND, orthopnea or leg swelling.She was recently evaluated by cardiology and states she has a 1 yr f/u Denies abdominal pain, nausea, vomitting, diarrhea or constipation. Denies change in bowel movements or bloody stool. Denies dysuria , frequency, incontinence or hesitancy. Denies  joint pain, swelling, or reduced mobility. Denies headaches, vertigo, seizures. 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 Denies  rash, lesions, or itch.     Current Medications (verified): 1)  Metoprolol Succinate 25 Mg  Tb24 (Metoprolol Succinate) .... Take One and One Half Tablet By Mouth  Twice Daily 2)  Glipizide Xl 10 Mg  Tb24 (Glipizide) .... Two Tabs By Mouth Once Daily 3)  Azor 10-40 Mg Tabs (Amlodipine-Olmesartan) .... Take 1 Tablet By Mouth Once A Day 4)  Avandamet 11-998 Mg Tabs (Rosiglitazone-Metformin) .... One Tab By Mouth Bid 5)  Lorazepam 1 Mg Tabs (Lorazepam) .... One Tab By Mouth Qhs 6)   Aspirin 325 Mg Tabs (Aspirin) .... Take 1 Tab Daily 7)  Chlorthalidone 25 Mg Tabs (Chlorthalidone) .... Take 1/2 Tablet (12.5mg ) By Mouth Once Daily 8)  Crestor 20 Mg Tabs (Rosuvastatin Calcium) .... Two Tabs By Mouth At Bedtime  Allergies (verified): No Known Drug Allergies  Review of Systems      See HPI Endo:  Denies cold intolerance, excessive hunger, excessive thirst, excessive urination, heat intolerance, polyuria, and weight change; fasting sugars when tested are seldom over 130.  Physical Exam  General:  alert, well-hydrated, and overweight-appearing.  HEENT: No facial asymmetry,  EOMI, No sinus tenderness, TM's Clear, oropharynx  pink and moist.   Chest: Clear to auscultation bilaterally.  CVS: S1, S2, systolic murmur, No S3.   Abd: Soft, Nontender.  MS: Adequate ROM spine, hips, shoulders and reduced in theknees.  Ext: No edema.   CNS: CN 2-12 intact, power tone and sensation normal throughout.   Skin: Intact, no visible lesions or rashes.  Psych: Good eye contact, normal affect.  Memory intact, not anxious or depressed appearing.    Impression & Recommendations:  Problem # 1:  HYPERTENSION (ICD-401.9) Assessment Improved  Her updated medication list for this problem includes:    Metoprolol Succinate 25 Mg Tb24 (Metoprolol succinate) .Marland Kitchen... Take one and one half tablet by mouth  twice daily    Azor 10-40 Mg Tabs (Amlodipine-olmesartan) .Marland Kitchen... Take 1 tablet by mouth once a day    Chlorthalidone 25  Mg Tabs (Chlorthalidone) .Marland Kitchen... Take 1/2 tablet (12.5mg ) by mouth once daily  Orders: T-Basic Metabolic Panel (99991111)  BP today: 140/80 Prior BP: 156/74 (05/13/2009)  Labs Reviewed: K+: 4.1 (12/27/2007) Creat: : 0.68 (12/27/2007)   Chol: 187 (12/27/2007)   HDL: 50 (12/27/2007)   LDL: 113 (12/27/2007)   TG: 119 (12/27/2007)  Problem # 2:  HYPERLIPIDEMIA (ICD-272.4) Assessment: Comment Only  The following medications were removed from the medication list:     Crestor 40 Mg Tabs (Rosuvastatin calcium) .Marland Kitchen... Take 1 tablet by mouth once daily Her updated medication list for this problem includes:    Crestor 20 Mg Tabs (Rosuvastatin calcium) .Marland Kitchen..Marland Kitchen Two tabs by mouth at bedtime    Crestor 40 Mg Tabs (Rosuvastatin calcium) .Marland Kitchen... Take 1 tab by mouth at bedtime  Orders: T-Hepatic Function [phone]) T-Lipid Profile [id number])  Labs Reviewed: SGOT: 12 (12/27/2007)   SGPT: 13 (12/27/2007)   HDL:50 (12/27/2007), 54 (08/13/2007)  LDL:113 (12/27/2007), 220 (08/13/2007)  Chol:187 (12/27/2007), 301 (08/13/2007)  Trig:119 (12/27/2007), 134 (08/13/2007)  Problem # 3:  DIABETES MELLITUS, TYPE II (ICD-250.00) Assessment: Improved  The following medications were removed from the medication list:    Avandamet 11-998 Mg Tabs (Rosiglitazone-metformin) ..... One tab by mouth bid Her updated medication list for this problem includes:    Glipizide Xl 10 Mg Tb24 (Glipizide) .Marland Kitchen..Marland Kitchen Two tabs by mouth once daily    Azor 10-40 Mg Tabs (Amlodipine-olmesartan) .Marland Kitchen... Take 1 tablet by mouth once a day    Aspirin 325 Mg Tabs (Aspirin) .Marland Kitchen... Take 1 tab daily    Janumet 50-1000 Mg Tabs (Sitagliptin-metformin hcl) .Marland Kitchen... Take 1 tablet by mouth two times a day  Orders: Glucose, (CBG) (82962) Hemoglobin A1C (83036)  Labs Reviewed: Creat: 0.68 (12/27/2007)    Reviewed HgBA1c results: 6.8 (05/28/2009)  7.1 (12/03/2008)  Problem # 4:  OBESITY, UNSPECIFIED (ICD-278.00) Assessment: Unchanged  Ht: 65 (05/28/2009)   Wt: 227.38 (05/28/2009)   BMI: 37.97 (05/28/2009)  Problem # 5:  KNEE PAIN (ICD-719.46) Assessment: Unchanged  Her updated medication list for this problem includes:    Aspirin 325 Mg Tabs (Aspirin) .Marland Kitchen... Take 1 tab daily followed by ortho  Problem # 6:  PALPITATIONS (ICD-785.1) Assessment: Improved  Her updated medication list for this problem includes:    Metoprolol Succinate 25 Mg Tb24 (Metoprolol succinate) .Marland Kitchen... Take one and one half tablet by mouth   twice daily  Complete Medication List: 1)  Metoprolol Succinate 25 Mg Tb24 (Metoprolol succinate) .... Take one and one half tablet by mouth  twice daily 2)  Glipizide Xl 10 Mg Tb24 (Glipizide) .... Two tabs by mouth once daily 3)  Azor 10-40 Mg Tabs (Amlodipine-olmesartan) .... Take 1 tablet by mouth once a day 4)  Lorazepam 1 Mg Tabs (Lorazepam) .... One tab by mouth qhs 5)  Aspirin 325 Mg Tabs (Aspirin) .... Take 1 tab daily 6)  Chlorthalidone 25 Mg Tabs (Chlorthalidone) .... Take 1/2 tablet (12.5mg ) by mouth once daily 7)  Crestor 20 Mg Tabs (Rosuvastatin calcium) .... Two tabs by mouth at bedtime 8)  Janumet 50-1000 Mg Tabs (Sitagliptin-metformin hcl) .... Take 1 tablet by mouth two times a day 9)  Crestor 40 Mg Tabs (Rosuvastatin calcium) .... Take 1 tab by mouth at bedtime  Other Orders: Influenza Vaccine NON MCR [id number])  Patient Instructions: 1)  Please schedule a follow-up appointment in66months. 2)  It is important that you exercise regularly at least 20 minutes 5 times a week. If you develop chest pain, have severe difficulty breathing,  or feel very tired , stop exercising immediately and seek medical attention. 3)  You need to lose weight. Consider a lower calorie diet and regular exercise.  4)  sTOP avandamet, you will start janumet 1 twice daily and the new dooe of crestor is 40 mg 1 at night 5)  BMP prior to visit, ICD-9: 6)  Hepatic Panel prior to visit, ICD-9:  fasting in 5 months 7)  Lipid Panel prior to visit, ICD-9: Prescriptions: CRESTOR 40 MG TABS (ROSUVASTATIN CALCIUM) Take 1 tab by mouth at bedtime  #90 x 3   Entered and Authorized by:   [name] MD   Signed by:   [name] MD on 05/28/2009   Method used:   Printed then faxed to ...       [facility]  [address] (retail)       [place], [place]  [address]       Ph: [phone]       Fax: [phone]   RxID:   [id number] JANUMET 50-1000 MG TABS  (SITAGLIPTIN-METFORMIN HCL) Take 1 tablet by mouth two times a day  #180 x 3   Entered and Authorized by:   [name] MD   Signed by:   [name] MD on 05/28/2009   Method used:   Printed then faxed to ...       [facility]  [address] (retail)       [address] [place], [place]  [address]       Ph: [phone]       Fax: [phone]   RxID:   [id number]   Laboratory Results   Blood Tests   Date/Time Received: 05/28/09 Date/Time Reported: 05/28/09  Glucose (random): 92 mg/dL   (Normal Range: 70-105) HGBA1C: 6.8%   (Normal Range: Non-Diabetic - 3-6%   Control Diabetic - 6-8%)       Influenza Vaccine    Vaccine Type: Fluvax Non-MCR    Site: left deltoid    Mfr: novartis    Dose: 0.5 ml    Route: IM    Given by: Jimmey Ralph LPN    Exp. Date: 04/11    Lot #: [id number]    VIS given: 03/08/07 version given May 28, 2009.   Appended Document: office visit pls rebill at a lower level 3, thanks  Appended Document: office visit  this will be billed at level 3

## 2010-09-14 NOTE — Letter (Signed)
Summary: RX ASSISTANCE  RX ASSISTANCE   Imported By: Dierdre Harness 05/07/2010 08:41:33  _____________________________________________________________________  External Attachment:    Type:   Image     Comment:   External Document

## 2010-09-14 NOTE — Miscellaneous (Signed)
Summary: REFILL  Clinical Lists Changes  Medications: Rx of LORAZEPAM 1 MG TABS (LORAZEPAM) ONE TAB by mouth QHS;  #30 x 0;  Signed;  Entered by: Jimmey Ralph LPN;  Authorized by: [name] MD;  Method used: Printed then faxed to [facility] [address], [place], [place], [place]  [address], Ph: [phone], Fax: [phone]    Prescriptions: LORAZEPAM 1 MG TABS (LORAZEPAM) ONE TAB by mouth QHS  #30 x 0   Entered by:   Jimmey Ralph LPN   Authorized by:   [name] MD   Signed by:   Jimmey Ralph LPN on [id number]   Method used:   Printed then faxed to ...       [facility]  [address] (retail)       [address] [place], [place]  [address]       Ph: [phone]       Fax: [phone]   RxID:   [id number]

## 2010-09-14 NOTE — Progress Notes (Signed)
Summary: has a cold  Phone Note Call from Patient   Summary of Call: has a cold will you call in something at [facility]. no fever  throat sore, coughing, and nose runny call back at [phone] Initial call taken by: Dierdre Harness,  June 24, 2010 10:19 AM  Follow-up for Phone Call        advise diabetic robitusssin pewr directions on bottle, salt water gargles, tylenol one twice daily as needed for pain or fever, fluids and rest, if worsens, urgent care over the weekend or ov next week Follow-up by: [name] MD,  June 25, 2010 5:07 AM  Additional Follow-up for Phone Call Additional follow up Details #1::        Patient aware Additional Follow-up by: Louie Casa CMA,  June 25, 2010 10:12 AM

## 2010-09-14 NOTE — Letter (Signed)
Summary: crestor  crestor   Imported By: Dierdre Harness 04/05/2010 13:02:08  _____________________________________________________________________  External Attachment:    Type:   Image     Comment:   External Document

## 2010-09-14 NOTE — Assessment & Plan Note (Signed)
Summary: LT KNEE PAIN/NEED XRAY/AETNA/FRS   Vital Signs:  Patient Profile:   65 Years Old Female Weight:      208 pounds Pulse rate:   76 / minute Resp:     16 per minute  Vitals Entered By: Peter Minium (November 13, 2007 1:43 PM)                 Chief Complaint:  left knee pain.  History of Present Illness: I saw [name] [name] in the office today for an initial visit.  She is a 65 years old woman with the complaint of:  Left knee pain.  No xrays have been taken recently, had xrays back in 2006 of the left knee at [facility].  She has been having bad pain for over a year. Pain is whole knee, no radiation.  Pain level today is around 7-8.  Has pain getting up and down from a chair.  She has swelling outer knee sometimes.  She has not had any injections in her knee or therapy or bracing.  Xtra strength tylenol helps some.    Prior Medication List:  METFORMIN HCL 1000 MG  TABS (METFORMIN HCL) one tab by mouth two times a day AMLODIPINE BESYLATE 10 MG  TABS (AMLODIPINE BESYLATE) one tab by mouth once daily METOPROLOL SUCCINATE 25 MG  TB24 (METOPROLOL SUCCINATE) two tabs by mouth once daily BENAZEPRIL HCL 40 MG  TABS (BENAZEPRIL HCL) one tab by mouth once daily GLIPIZIDE XL 10 MG  TB24 (GLIPIZIDE) two tabs by mouth once daily ACTOS 30 MG  TABS (PIOGLITAZONE HCL) one tab by mouth once daily SIMVASTATIN 80 MG  TABS (SIMVASTATIN) one tab by mouth at bedtime   Current Allergies (reviewed today): No known allergies   Past Medical History:    Reviewed history and no changes required:       high blood pressure       cholesterol       diabetes  Past Surgical History:    Reviewed history and no changes required:       c section   Family History:    Reviewed history and no changes required:       FH of Cancer:        Family History of Diabetes       Family History Coronary Heart Disease female < 8       Family History of Arthritis  Social History:  Reviewed history and no changes required:       Patient is married.        cleaning business   Risk Factors:  Tobacco use:  never Caffeine use:  0 drinks per day Alcohol use:  no  Family History Risk Factors:    Family History of MI in males < 33 years old:  yes   Review of Systems  MS      Complains of joint pain.      Denies rheumatoid arthritis, joint swelling, gout, bone cancer, osteoporosis, and .  Endo      Complains of diabetes.      Denies thyroid disease and goiter.  The review of systems is negative for General, Cardiac , Resp, GI, GU, Neuro, Psych, Derm, EENT, Immunology, and Lymphatic.   Physical Exam  Msk:      The upper extremities have normal appearance, ROM, strength and stability.  Skin:     intact without lesions or rashes Cervical Nodes:     no significant adenopathy Psych:  alert and cooperative; normal mood and affect; normal attention span and concentration   Knee Exam  General:    Well-developed, well-nourished, normal body habitus; no deformities, normal grooming.  Gait:    limp noted-left.    Skin:    Intact, no scars, lesions, rashes, cafe au lait spots, or bruising.    Inspection:    mild left knee deformity:   Palpation:    tenderness L-lateral joint line.    Vascular:    There was no swelling or varicose veins. The pulses and temperature are normal. There was no edema or tenderness.  Sensory:    Gross coordination and sensation were normal.    Motor:    Motor strength 5/5 bilaterally for quadriceps, hamstrings, ankle dorsiflexion, and ankle plantar flexion.    Reflexes:    Normal and symmetric patellar and Achilles reflexes bilaterally.    Knee Exam:    Right:    Inspection:  Normal    Palpation:  Normal    Stability:  stable    Tenderness:  no    Swelling:  no    Range of Motion:       Flexion-Active: 120 degrees       Extension-Active: full    Left:    Inspection:  Abnormal    Palpation:  Abnormal     Stability:  stable    Swelling:  no    Range of Motion:       Flexion-Active: 110 degrees       Extension-Active: -2 degrees    Impression & Recommendations:  Problem # 1:  OSTEOARTHRITIS, KNEE, LEFT (ICD-715.96)  Orders: New Patient Level IV [id number]) Knee x-ray,  3 views [id number])  moderate to severe OA left knee    Problem # 2:  KNEE PAIN [id number])   Patient Instructions: 1)  You have end stage arthritis of the knee  2)  You need to have the knee replaced  3)  Please let us know when you would like to discuss this further     ]

## 2010-09-14 NOTE — Medication Information (Signed)
Summary: Visual merchandiser   Imported By: Dierdre Harness 03/25/2009 15:25:33  _____________________________________________________________________  External Attachment:    Type:   Image     Comment:   External Document

## 2010-09-14 NOTE — Medication Information (Signed)
Summary: Visual merchandiser   Imported By: Dierdre Harness 05/29/2009 14:54:19  _____________________________________________________________________  External Attachment:    Type:   Image     Comment:   External Document

## 2010-09-14 NOTE — Assessment & Plan Note (Signed)
Summary: physical   Vital Signs:  Patient profile:   65 year old female Menstrual status:  postmenopausal Height:      65 inches Weight:      207.75 pounds BMI:     34.70 O2 Sat:      95 % on Room air Pulse rate:   81 / minute Pulse rhythm:   regular Resp:     16 per minute BP sitting:   166 / 100  (left arm)  Vitals Entered By: Louie Casa CMA (June 15, 2010 1:20 PM)  Nutrition Counseling: Patient's BMI is greater than 25 and therefore counseled on weight management options.  O2 Flow:  Room air CC: Physical. Right eyelid swollen, runny.  Vision Screening:Left eye with correction: 34 / 25 Right eye with correction: 20 / 25 Both eyes with correction: 20 / 20        Vision Entered By: Louie Casa CMA (June 15, 2010 1:32 PM)   Primary Care Provider:  Dr.Margaret Moshe Cipro  CC:  Physical. Right eyelid swollen and runny..  History of Present Illness: Pt in for amnual cPE. she has in the past week developed swelling and redness around right eye which has been runny. Denies pain or loss of vision. she has had no recent fever or chills. She denies head or chesrt congestion. She denies dysuria or frequency. she does have joint pain and stiffness, primarily affecting her knees.  Current Medications (verified): 1)  Metoprolol Succinate 25 Mg  Tb24 (Metoprolol Succinate) .... Take One and One Half Tablet By Mouth  Twice Daily 2)  Glipizide Xl 10 Mg  Tb24 (Glipizide) .... Two Tabs By Mouth Once Daily 3)  Azor 10-40 Mg Tabs (Amlodipine-Olmesartan) .... Take 1 Tablet By Mouth Once A Day 4)  Aspirin 325 Mg Tabs (Aspirin) .... Take 1 Tab Daily 5)  Crestor 20 Mg Tabs (Rosuvastatin Calcium) .... Take 1 Tab By Mouth At Bedtime 6)  Janumet 50-1000 Mg Tabs (Sitagliptin-Metformin Hcl) .... Take 1 Tablet By Mouth Two Times A Day 7)  Fluoxetine Hcl 10 Mg Caps (Fluoxetine Hcl) .... Take 1 Capsule By Mouth Once A Day  Allergies (verified): No Known Drug Allergies  Review of  Systems      See HPI General:  Complains of fatigue; denies chills and fever. Eyes:  Complains of blurring, discharge, and eye irritation; denies eye pain. ENT:  Denies hoarseness, nasal congestion, and sinus pressure. CV:  Denies chest pain or discomfort, palpitations, and swelling of feet. Resp:  Denies cough and sputum productive. GI:  Denies abdominal pain, constipation, diarrhea, indigestion, loss of appetite, nausea, and vomiting. GU:  Denies dysuria, incontinence, and urinary frequency. MS:  Complains of joint pain, low back pain, mid back pain, and stiffness. Derm:  Complains of rash; rash over right eye x 1 week. Neuro:  Denies headaches, poor balance, seizures, and sensation of room spinning. Psych:  Complains of anxiety and depression; denies mental problems, suicidal thoughts/plans, thoughts of violence, and unusual visions or sounds; improved on meds. Endo:  Denies cold intolerance, excessive hunger, excessive thirst, excessive urination, and heat intolerance; reports fluctuating blood sugars. Heme:  Denies abnormal bruising and bleeding. Allergy:  Complains of seasonal allergies.  Physical Exam  General:  Well-developed,obese,in no acute distress; alert,appropriate and cooperative throughout examination Head:  Normocephalic and atraumatic withoutright supraorbital cellulitis. No apparent alopecia or balding. Eyes:  vision grossly intact, pupils round, and pupils reactive to light.  Right supraorbital cellulitis Ears:  External ear exam shows  no significant lesions or deformities.  Otoscopic examination reveals clear canals, tympanic membranes are intact bilaterally without bulging, retraction, inflammation or discharge. Hearing is grossly normal bilaterally. Nose:  External nasal examination shows no deformity or inflammation. Nasal mucosa are pink and moist without lesions or exudates. Mouth:  Oral mucosa and oropharynx without lesions or exudates.  Teeth in good repair. Neck:   No deformities, masses, or tenderness noted. Chest Wall:  No deformities, masses, or tenderness noted. Breasts:  No mass, nodules, thickening, tenderness, bulging, retraction, inflamation, nipple discharge or skin changes noted.   Lungs:  Normal respiratory effort, chest expands symmetrically. Lungs are clear to auscultation, no crackles or wheezes. Heart:  Normal rate and regular rhythm. S1 and S2 normal without gallop, click, rub or other extra sounds. murmur of aortic stenosis Abdomen:  Bowel sounds positive,abdomen soft and non-tender without masses, organomegaly or hernias noted. Rectal:  No external abnormalities noted. Normal sphincter tone. No rectal masses or tenderness. Genitalia:  Normal introitus for age, no external lesions, no vaginal discharge, mucosa pink and moist, no vaginal or cervical lesions, no vaginal atrophy, no friaility or hemorrhage, normal uterus size and position, no adnexal masses or tenderness Msk:  No deformity or scoliosis noted of thoracic or lumbar spine.   Pulses:  R and L carotid,radial,femoral,dorsalis pedis and posterior tibial pulses are full and equal bilaterally Extremities:  decreased rOM spine,and knees,  adequate in hips, and shoulders  Neurologic:  No cranial nerve deficits noted. Station and gait are normal. Plantar reflexes are down-going bilaterally. DTRs are symmetrical throughout.  motor and coordinative functions appear intact.  Diabetes Management Exam:    Foot Exam (with socks and/or shoes not present):       Sensory-Monofilament:          Left foot: diminished          Right foot: diminished       Inspection:          Left foot: normal          Right foot: normal       Nails:          Left foot: thickened          Right foot: thickened   Impression & Recommendations:  Problem # 1:  PERIORBITAL CELLULITIS (ICD-376.01) Assessment Comment Only  Orders: Medicare Electronic Prescription [id number])  Problem # 2:  DEPRESSION  (ICD-311) Assessment: Improved  The following medications were removed from the medication list:    Lorazepam 1 Mg Tabs (Lorazepam) ..... One tab by mouth qhs Her updated medication list for this problem includes:    Fluoxetine Hcl 10 Mg Caps (Fluoxetine hcl) .Marland Kitchen... Take 1 capsule by mouth once a day  Problem # 3:  HYPERTENSION (ICD-401.9) Assessment: Deteriorated  The following medications were removed from the medication list:    Chlorthalidone 25 Mg Tabs (Chlorthalidone) .Marland Kitchen... Take 1/2 tablet (12.5mg ) by mouth once daily Her updated medication list for this problem includes:    Metoprolol Succinate 25 Mg Tb24 (Metoprolol succinate) .Marland Kitchen... Take one and one half tablet by mouth  twice daily    Azor 10-40 Mg Tabs (Amlodipine-olmesartan) .Marland Kitchen... Take 1 tablet by mouth once a day  BP today: 166/100 Prior BP: 150/90 (04/28/2010)  Labs Reviewed: K+: 4.1 (05/27/2010) Creat: : 0.87 (05/27/2010)   Chol: 150 (05/27/2010)   HDL: 38 (05/27/2010)   LDL: 85 (05/27/2010)   TG: 134 (05/27/2010)  Problem # 4:  DIABETES MELLITUS, TYPE II (ICD-250.00) Assessment: Deteriorated  Her updated medication list for this problem includes:    Glipizide Xl 10 Mg Tb24 (Glipizide) .Marland Kitchen..Marland Kitchen Two tabs by mouth once daily    Azor 10-40 Mg Tabs (Amlodipine-olmesartan) .Marland Kitchen... Take 1 tablet by mouth once a day    Aspirin 325 Mg Tabs (Aspirin) .Marland Kitchen... Take 1 tab daily    Janumet 50-1000 Mg Tabs (Sitagliptin-metformin hcl) .Marland Kitchen... Take 1 tablet by mouth two times a day  Orders: T- Hemoglobin A1C [id number]) T-Urine Microalbumin w/creat. ratio [id number])  Labs Reviewed: Creat: 0.87 (05/27/2010)    Reviewed HgBA1c results: 9.0 (05/27/2010)  6.8 (05/28/2009)  Complete Medication List: 1)  Metoprolol Succinate 25 Mg Tb24 (Metoprolol succinate) .... Take one and one half tablet by mouth  twice daily 2)  Glipizide Xl 10 Mg Tb24 (Glipizide) .... Two tabs by mouth once daily 3)  Azor 10-40 Mg Tabs  (Amlodipine-olmesartan) .... Take 1 tablet by mouth once a day 4)  Aspirin 325 Mg Tabs (Aspirin) .... Take 1 tab daily 5)  Crestor 20 Mg Tabs (Rosuvastatin calcium) .... Take 1 tab by mouth at bedtime 6)  Janumet 50-1000 Mg Tabs (Sitagliptin-metformin hcl) .... Take 1 tablet by mouth two times a day 7)  Fluoxetine Hcl 10 Mg Caps (Fluoxetine hcl) .... Take 1 capsule by mouth once a day 8)  Cephalexin 250 Mg Tabs (Cephalexin) .... Take 1 tablet by mouth two times a day  Other Orders: Influenza Vaccine NON MCR [id number]) T-Pap Smear, Thin Prep [id number])  Patient Instructions: 1)  Please schedule a follow-up appointment in 3.5 months. 2)  yoour blood pressure and blood sugar are uncontrolled, we will call for azor 10/40 samples for you, in the interim , we are providing you with azor 10/20 take ONE daily, until you get your correct dose. 3)  your blood sugar is uncontrolled, you need to change your diet and start exercise or you will need to start insulin. 4)  Cholesterol is great. 5)  Flu vaccine today. 6)  HbgA1C prior to visit, ICD-9: non fasting in 3 to 3.5 months Prescriptions: CEPHALEXIN 250 MG TABS (CEPHALEXIN) Take 1 tablet by mouth two times a day  #14 x 0   Entered by:   Baldomero Lamy LPN   Authorized by:   [name] MD   Signed by:   Baldomero Lamy LPN on 624THL   Method used:   Electronically to        [facility] (retail)       [place] [address] [place], [place]  [address]       Ph: [phone]       Fax: [phone]   RxID:   [id number] CRESTOR 20 MG TABS (ROSUVASTATIN CALCIUM) Take 1 tab by mouth at bedtime  #90 x 2   Entered by:   Louie Casa CMA   Authorized by:   [name] MD   Signed by:   Louie Casa CMA on 06/15/2010   Method used:   Electronically to        [facility] (retail)       [place] [address]       [place], [place]  [address]       Ph: [phone]       Fax:  [phone]   RxID:   [id number] JANUMET 50-1000 MG TABS (SITAGLIPTIN-METFORMIN HCL) Take 1 tablet by mouth two times a day  #  180 x 2   Entered by:   Louie Casa CMA   Authorized by:   [name] MD   Signed by:   Louie Casa CMA on 06/15/2010   Method used:   Electronically to        [facility] (retail)       [place] [address]       [place], [place]  [address]       Ph: [phone]       Fax: [phone]   RxID:   [id number] AZOR 10-40 MG TABS (AMLODIPINE-OLMESARTAN) Take 1 tablet by mouth once a day  #30 x 2   Entered by:   Louie Casa CMA   Authorized by:   [name] MD   Signed by:   Louie Casa CMA on 06/15/2010   Method used:   Electronically to        [facility] (retail)       [place] [address]       [place], [place]  [address]       Ph: [phone]       Fax: [phone]   RxID:   [id number] CEPHALEXIN 250 MG TABS (CEPHALEXIN) Take 1 tablet by mouth two times a day  #14 x 0   Entered and Authorized by:   [name] MD   Signed by:   [name] MD on 06/15/2010   Method used:   Printed then faxed to ...         RxIDIN:[id number]    Orders Added: 1)  Est. Patient 40-64 years [id number] 2)  Medicare Electronic Prescription [id number] 3)  T- Hemoglobin A1C [83036-23375] 4)  T-Urine Microalbumin w/creat. ratio [[id number]] 5)  Influenza Vaccine NON MCR [[id number]] 6)  T-Pap Smear, Thin Prep [id number]   Immunizations Administered:  Influenza Vaccine:    Vaccine Type: Fluvax Non-MCR    Site: left deltoid    Mfr: novartis    Dose: 0.5 ml    Route: IM    Given by: Louie Casa CMA    Exp. Date: 12/2010    Lot #: [id number] 5p    VIS given: 03/09/10 version given June 15, 2010.   Immunizations Administered:  Influenza Vaccine:    Vaccine Type: Fluvax Non-MCR    Site: left deltoid    Mfr: novartis    Dose: 0.5 ml    Route: IM    Given by:  Louie Casa CMA    Exp. Date: 12/2010    Lot #: [id number] 5p    VIS given: 03/09/10 version given June 15, 2010.   Laboratory Results  Date/Time Received: June 15, 2010 2:23 PM  Date/Time Reported: June 15, 2010 2:23 PM   Stool - Occult Blood Hemmoccult #1: negative Date: 06/15/2010 Comments: [id number] 14-05 50590 1L 03-12 Louie Casa CMA  June 15, 2010 2:23 PM

## 2010-09-14 NOTE — Medication Information (Signed)
Summary: Visual merchandiser   Imported By: Dierdre Harness 05/29/2008 14:02:47  _____________________________________________________________________  External Attachment:    Type:   Image     Comment:   External Document

## 2010-09-14 NOTE — Letter (Signed)
Summary: Medical records request Disab Determination  Medical records request Disab Determination   Imported By: Ihor Austin 11/18/2008 19:49:14  _____________________________________________________________________  External Attachment:    Type:   Image     Comment:   External Document

## 2010-09-14 NOTE — Medication Information (Signed)
Summary: Visual merchandiser   Imported By: Dierdre Harness 07/15/2008 09:02:51  _____________________________________________________________________  External Attachment:    Type:   Image     Comment:   External Document

## 2010-09-14 NOTE — Progress Notes (Signed)
  Phone Note Other Incoming   Caller: hmana Summary of Call: need to change from azor 10/40 to amlodipine 10mg  Take 1 tablet by mouth once a day , and benazepril 40mg  Take 1 tablet by mouth once a day , will enter the change, and ask you to [print and fax after you spk with her. PLS pA the janumet she is on mac metformion and glipizide and is uncontrolled Initial call taken by: [name] MD,  June 27, 2010 5:13 PM  Follow-up for Phone Call        patient aware Follow-up by: Baldomero Lamy LPN,  November 15, 624THL 2:36 PM    New/Updated Medications: AMLODIPINE BESYLATE 10 MG TABS (AMLODIPINE BESYLATE) Take 1 tablet by mouth once a day AMLODIPINE BESYLATE 10 MG TABS (AMLODIPINE BESYLATE) Take 1 tablet by mouth once a day discontinue azor BENAZEPRIL HCL 40 MG TABS (BENAZEPRIL HCL) Take 1 tablet by mouth once a day BENAZEPRIL HCL 40 MG TABS (BENAZEPRIL HCL) Take 1 tablet by mouth once a day discontinue azor Prescriptions: BENAZEPRIL HCL 40 MG TABS (BENAZEPRIL HCL) Take 1 tablet by mouth once a day discontinue azor  #30 x 3   Entered by:   Baldomero Lamy LPN   Authorized by:   [name] MD   Signed by:   Baldomero Lamy LPN on 579FGE   Method used:   Electronically to        [facility] (retail)       [place] [address] [place], [place]  [address]       Ph: [phone]       Fax: [phone]   RxIDHA:[id number] AMLODIPINE BESYLATE 10 MG TABS (AMLODIPINE BESYLATE) Take 1 tablet by mouth once a day discontinue azor  #30 x 3   Entered by:   Baldomero Lamy LPN   Authorized by:   [name] MD   Signed by:   Baldomero Lamy LPN on 579FGE   Method used:   Electronically to        [facility] (retail)       [place] [address]       [place], [place]  [address]       Ph: [phone]       Fax: [phone]   RxIDNG:[id number] BENAZEPRIL HCL 40 MG TABS (BENAZEPRIL HCL) Take 1 tablet by  mouth once a day  #30 x 4   Entered and Authorized by:   [name] MD   Signed by:   [name] MD on 06/27/2010   Method used:   Historical   [id number] AMLODIPINE BESYLATE 10 MG TABS (AMLODIPINE BESYLATE) Take 1 tablet by mouth once a day  #30 x 4   Entered and Authorized by:   [name] MD   Signed by:   [name] MD on 06/27/2010   Method used:   Historical   RxIDYT:[id number]

## 2010-09-14 NOTE — Assessment & Plan Note (Signed)
Summary: ov   Vital Signs:  Patient Profile:   65 Years Old Female Height:     65 inches Weight:      207 pounds BMI:     34.57 O2 Sat:      98 % Pulse rate:   73 / minute Resp:     16 per minute BP sitting:   170 / 92  (right arm)  Pt. in pain?   no  Vitals Entered By: Kate Sable (May 22, 2008 3:13 PM)                  Chief Complaint:  i am stressed, i cannot get my meds since i have no insurance, and I need help.  History of Present Illness: The pt. is in for f/u and reports being significantly more strssed based on the deterioration in her husband's health and her inability to afford medical health care and her meds now that she is out of work and unemployed. She denies any recent fever or chills. She denies polyuria, polydypsia or hypoglycemic episodes.    Current Allergies: No known allergies    Social History:    Patient is married.     Employed    5 adult children    Never Smoked    Alcohol use-no    Drug use-no   Risk Factors:  Tobacco use:  never Drug use:  no Alcohol use:  no   Review of Systems  Eyes      Denies blurring, discharge, double vision, eye irritation, itching, and red eye.  ENT      Denies decreased hearing, difficulty swallowing, ear discharge, earache, hoarseness, nasal congestion, nosebleeds, postnasal drainage, ringing in ears, sinus pressure, and sore throat.  CV      Denies bluish discoloration of lips or nails, chest pain or discomfort, difficulty breathing at night, difficulty breathing while lying down, fainting, fatigue, leg cramps with exertion, lightheadness, near fainting, palpitations, shortness of breath with exertion, swelling of feet, swelling of hands, and weight gain.  GI      Denies abdominal pain, bloody stools, change in bowel habits, constipation, dark tarry stools, diarrhea, excessive appetite, gas, hemorrhoids, indigestion, loss of appetite, nausea, vomiting, vomiting blood, and yellowish skin color.   GU      Denies discharge, incontinence, nocturia, urinary frequency, and urinary hesitancy.  MS      Complains of joint pain, low back pain, mid back pain, and stiffness.  Neuro      Denies falling down, headaches, poor balance, and seizures.  Psych      Complains of anxiety and depression.      Denies suicidal thoughts/plans, thoughts of violence, and unusual visions or sounds.  Allergy      Denies hives or rash, itching eyes, and sneezing.   Physical Exam  General:     overweight-appearing.   Head:     Normocephalic and atraumatic without obvious abnormalities. No apparent alopecia or balding. Eyes:     vision grossly intact.   Ears:     R ear normal and L ear normal.   Nose:     no external erythema and no nasal discharge.   Mouth:     poor dentition.   Neck:     No deformities, masses, or tenderness noted. Lungs:     Normal respiratory effort, chest expands symmetrically. Lungs are clear to auscultation, no crackles or wheezes. Heart:     Normal rate and regular rhythm. S1 and  S2 normal without gallop, murmur, click, rub or other extra sounds. Abdomen:     soft and non-tender.   Msk:     decreased ROM spine , hips and knees Extremities:     no edema Neurologic:     alert & oriented X3, cranial nerves II-XII intact, and strength normal in all extremities.   Skin:     Intact without suspicious lesions or rashes Psych:     Oriented X3, memory intact for recent and remote, normally interactive, and good eye contact.  anxios, tearful and depressed  Diabetes Management Exam:    Foot Exam (with socks and/or shoes not present):       Sensory-Pinprick/Light touch:          Left medial foot (L-4): diminished          Left dorsal foot (L-5): diminished          Left lateral foot (S-1): diminished          Right medial foot (L-4): diminished          Right dorsal foot (L-5): diminished          Right lateral foot (S-1): diminished       Inspection:          Left  foot: normal          Right foot: normal       Nails:          Left foot: normal          Right foot: normal    Eye Exam:       Eye Exam done elsewhere    Impression & Recommendations:  Problem # 1:  KNEE PAIN (ICD-719.46) Assessment: Unchanged  Problem # 2:  DIABETES MELLITUS, TYPE II (ICD-250.00) Assessment: Deteriorated  The following medications were removed from the medication list:    Metformin Hcl 1000 Mg Tabs (Metformin hcl) ..... One tab by mouth two times a day    Benazepril Hcl 40 Mg Tabs (Benazepril hcl) ..... One tab by mouth once daily    Actos 30 Mg Tabs (Pioglitazone hcl) ..... One tab by mouth once daily  Her updated medication list for this problem includes:    Glipizide Xl 10 Mg Tb24 (Glipizide) .Marland Kitchen..Marland Kitchen Two tabs by mouth once daily    Avandamet 2-500 Mg Tabs (Rosiglitazone-metformin) .Marland Kitchen..Marland Kitchen Two tabs by mouth bid    Avandamet 11-998 Mg Tabs (Rosiglitazone-metformin) ..... One tab by mouth bid  Orders: Glucose, (CBG) (82962) Hemoglobin A1C (83036)  Labs Reviewed: HgBA1c: 9.2 (05/22/2008)   Creat: 0.68 (12/27/2007)   Microalbumin: 1.65 (08/13/2007)   Problem # 3:  HYPERTENSION (ICD-401.9) Assessment: Comment Only  The following medications were removed from the medication list:    Amlodipine Besylate 10 Mg Tabs (Amlodipine besylate) ..... One tab by mouth once daily    Benazepril Hcl 40 Mg Tabs (Benazepril hcl) ..... One tab by mouth once daily  Her updated medication list for this problem includes:    Metoprolol Succinate 25 Mg Tb24 (Metoprolol succinate) .Marland Kitchen... Take one and one half tablet by mouth  twice daily    Azor 5-20 Mg Tabs (Amlodipine-olmesartan) .Marland Kitchen..Marland Kitchen Two tabs by mouth qd  Orders: T-Basic Metabolic Panel ([id number])  BP today: 170/92  Labs Reviewed: Creat: 0.68 (12/27/2007) Chol: 187 (12/27/2007)   HDL: 50 (12/27/2007)   LDL: 113 (12/27/2007)   TG: 119 (12/27/2007)   Problem # 4:  HYPERLIPIDEMIA (ICD-272.4) Assessment: Comment Only   The following medications were removed  from the medication list:    Simvastatin 80 Mg Tabs (Simvastatin) ..... One tab by mouth at bedtime  Her updated medication list for this problem includes:    Crestor 20 Mg Tabs (Rosuvastatin calcium) ..... One tab by mouth qhs  Orders: T-Lipid Profile [id number]) T-Hepatic Function [id number])  Labs Reviewed: Chol: 187 (12/27/2007)   HDL: 50 (12/27/2007)   LDL: 113 (12/27/2007)   TG: 119 (12/27/2007) SGOT: 12 (12/27/2007)   SGPT: 13 (12/27/2007)   Problem # 5:  OBESITY, UNSPECIFIED (ICD-278.00) Assessment: Unchanged  Orders: T-TSH [id number])   Complete Medication List: 1)  Metoprolol Succinate 25 Mg Tb24 (Metoprolol succinate) .... Take one and one half tablet by mouth  twice daily 2)  Glipizide Xl 10 Mg Tb24 (Glipizide) .... Two tabs by mouth once daily 3)  Avandamet 2-500 Mg Tabs (Rosiglitazone-metformin) .... Two tabs by mouth bid 4)  Azor 5-20 Mg Tabs (Amlodipine-olmesartan) .... Two tabs by mouth qd 5)  Crestor 20 Mg Tabs (Rosuvastatin calcium) .... One tab by mouth qhs 6)  Avandamet 11-998 Mg Tabs (Rosiglitazone-metformin) .... One tab by mouth bid   Patient Instructions: 1)  Please schedule a follow-up appointment in 3 months. 2)  YOU will start new meds for blood pressure and diabetes and cholesterol. We are going to send a request for free meds for you. 3)  NEW  AZOR 10/40 1 daily for BP 4)  Avandamet 4/100001 twice daily for diabetes 5)  Crestor 20mg  1 at night for cholesterol. 6)  CONTINUE Glipizide and metoprolol as before. Take the last of the simvastatin until done do not refill. 7)  It is important that you exercise regularly at least 20 minutes 5 times a week. If you develop chest pain, have severe difficulty breathing, or feel very tired , stop exercising immediately and seek medical attention. 8)  You need to lose weight. Consider a lower calorie diet and regular exercise.    Prescriptions: AVANDAMET 11-998  MG TABS (ROSIGLITAZONE-METFORMIN) ONE TAB by mouth BID  #60 x 2   Entered by:   Jimmey Ralph LPN   Authorized by:   [name] MD   Signed by:   [name] MD on 05/26/2008   Method used:   Handwritten   [id number] CRESTOR 20 MG TABS (ROSUVASTATIN CALCIUM) ONE TAB by mouth QHS  #30 x 2   Entered by:   Jimmey Ralph LPN   Authorized by:   [name] MD   Signed by:   [name] MD on 05/26/2008   Method used:   Handwritten   [id number] AZOR 5-20 MG TABS (AMLODIPINE-OLMESARTAN) TWO TABS by mouth QD  #42 x 0   Entered by:   Jimmey Ralph LPN   Authorized by:   [name] MD   Signed by:   [name] MD on 05/26/2008   Method used:   Samples Given   RxID:   [id number] AVANDAMET 2-500 MG TABS (ROSIGLITAZONE-METFORMIN) TWO TABS by mouth BID  #84 x 0   Entered by:   Jimmey Ralph LPN   Authorized by:   [name] MD   Signed by:   [name] MD on 05/26/2008   Method used:   Samples Given   RxID:   [id number]  ] Laboratory Results   Blood Tests   Date/Time Received: May 22, 2008 3:00pm Date/Time Reported: May 22, 2008  3:oopm  Glucose (random): 151 mg/dL   (Normal Range: 70-105) HGBA1C: 9.2%   (Normal  Range: Non-Diabetic - 3-6%   Control Diabetic - 6-8%)

## 2010-09-14 NOTE — Medication Information (Signed)
Summary: Visual merchandiser   Imported By: Dierdre Harness 08/12/2009 11:04:27  _____________________________________________________________________  External Attachment:    Type:   Image     Comment:   External Document

## 2010-09-14 NOTE — Medication Information (Signed)
Summary: Visual merchandiser   Imported By: Dierdre Harness 12/10/2008 08:18:12  _____________________________________________________________________  External Attachment:    Type:   Image     Comment:   External Document

## 2010-09-14 NOTE — Miscellaneous (Signed)
Summary: refill  Clinical Lists Changes  Medications: Rx of GLIPIZIDE XL 10 MG  TB24 (GLIPIZIDE) two tabs by mouth once daily;  #3 monthsupp x 3;  Signed;  Entered by: Kate Sable;  Authorized by: [name] MD;  Method used: Historical    Prescriptions: GLIPIZIDE XL 10 MG  TB24 (GLIPIZIDE) two tabs by mouth once daily  #3 monthsupp x 3   Entered by:   Kate Sable   Authorized by:   [name] MD   Signed by:   Kate Sable on 06/05/2009   Method used:   Historical   [id number]

## 2010-09-14 NOTE — Letter (Signed)
Summary: PATIENT ASSISTANCE PROGRAM RX  PATIENT ASSISTANCE PROGRAM RX   Imported By: Dierdre Harness 10/29/2009 08:59:29  _____________________________________________________________________  External Attachment:    Type:   Image     Comment:   External Document

## 2010-09-16 NOTE — Letter (Signed)
Summary: rx assistance  rx assistance   Imported By: Dierdre Harness 08/25/2010 09:04:42  _____________________________________________________________________  External Attachment:    Type:   Image     Comment:   External Document

## 2010-09-16 NOTE — Letter (Signed)
Summary: rx assistance   rx assistance   Imported By: Dierdre Harness 08/25/2010 09:05:47  _____________________________________________________________________  External Attachment:    Type:   Image     Comment:   External Document

## 2010-09-16 NOTE — Letter (Signed)
Summary: RX ASSISTANCE  RX ASSISTANCE   Imported By: Dierdre Harness 08/25/2010 13:16:15  _____________________________________________________________________  External Attachment:    Type:   Image     Comment:   External Document

## 2010-09-23 ENCOUNTER — Encounter: Payer: Self-pay | Admitting: Family Medicine

## 2010-09-30 NOTE — Letter (Signed)
Summary: rx assistance  rx assistance   Imported By: Dierdre Harness 09/23/2010 14:29:08  _____________________________________________________________________  External Attachment:    Type:   Image     Comment:   External Document

## 2010-10-11 ENCOUNTER — Encounter: Payer: Self-pay | Admitting: Family Medicine

## 2010-10-11 ENCOUNTER — Ambulatory Visit: Payer: Self-pay | Admitting: Family Medicine

## 2010-10-21 NOTE — Letter (Signed)
Summary: 1st no show letter  1st no show letter   Imported By: Dierdre Harness 10/12/2010 08:24:42  _____________________________________________________________________  External Attachment:    Type:   Image     Comment:   External Document

## 2010-11-29 ENCOUNTER — Other Ambulatory Visit: Payer: Self-pay | Admitting: *Deleted

## 2010-11-29 MED ORDER — AMLODIPINE BESYLATE 10 MG PO TABS: 10.0000 mg | ORAL_TABLET | Freq: Every day | ORAL | Status: DC

## 2010-11-29 MED ORDER — METOPROLOL SUCCINATE ER 25 MG PO TB24: 25.0000 mg | ORAL_TABLET | Freq: Every day | ORAL | Status: DC

## 2010-11-29 MED ORDER — BENAZEPRIL HCL 40 MG PO TABS: 40.0000 mg | ORAL_TABLET | ORAL | Status: DC

## 2010-12-16 ENCOUNTER — Telehealth: Payer: Self-pay | Admitting: Family Medicine

## 2010-12-16 ENCOUNTER — Encounter: Payer: Self-pay | Admitting: Family Medicine

## 2010-12-16 DIAGNOSIS — E119 Type 2 diabetes mellitus without complications: Secondary | ICD-10-CM

## 2010-12-16 NOTE — Telephone Encounter (Signed)
Cmp, egfr, hBA1C , microalb

## 2010-12-16 NOTE — Telephone Encounter (Signed)
Sent labs to lab and left patient message

## 2010-12-16 NOTE — Telephone Encounter (Signed)
What labs do you want for this patient? Going Monday

## 2010-12-17 NOTE — Telephone Encounter (Signed)
Patient aware.

## 2010-12-18 LAB — MICROALBUMIN / CREATININE URINE RATIO
Creatinine, Urine: 88.7 mg/dL
Microalb Creat Ratio: 82.5 mg/g — ABNORMAL HIGH (ref 0.0–30.0)
Microalb, Ur: 7.32 mg/dL — ABNORMAL HIGH (ref 0.00–1.89)

## 2010-12-18 LAB — COMPLETE METABOLIC PANEL WITH GFR
ALT: 10 U/L (ref 0–35)
AST: 12 U/L (ref 0–37)
Albumin: 4.6 g/dL (ref 3.5–5.2)
Alkaline Phosphatase: 48 U/L (ref 39–117)
BUN: 13 mg/dL (ref 6–23)
CO2: 24 mEq/L (ref 19–32)
Calcium: 10 mg/dL (ref 8.4–10.5)
Chloride: 105 mEq/L (ref 96–112)
Creat: 0.85 mg/dL (ref 0.40–1.20)
GFR, Est African American: >60 mL/min (ref >60)
GFR, Est Non African American: >60 mL/min (ref >60)
Glucose, Bld: 168 mg/dL — ABNORMAL HIGH (ref 70–99)
Potassium: 4.5 mEq/L (ref 3.5–5.3)
Sodium: 141 mEq/L (ref 135–145)
Total Bilirubin: 0.3 mg/dL (ref 0.3–1.2)
Total Protein: 7.5 g/dL (ref 6.0–8.3)

## 2010-12-18 LAB — HEMOGLOBIN A1C
Hgb A1c MFr Bld: 7.6 % — ABNORMAL HIGH (ref <5.7)
Mean Plasma Glucose: 171 mg/dL — ABNORMAL HIGH (ref <117)

## 2010-12-20 ENCOUNTER — Ambulatory Visit (INDEPENDENT_AMBULATORY_CARE_PROVIDER_SITE_OTHER): Payer: Medicare PPO | Admitting: Family Medicine

## 2010-12-20 ENCOUNTER — Encounter: Payer: Self-pay | Admitting: Family Medicine

## 2010-12-20 DIAGNOSIS — E785 Hyperlipidemia, unspecified: Secondary | ICD-10-CM

## 2010-12-20 DIAGNOSIS — E119 Type 2 diabetes mellitus without complications: Secondary | ICD-10-CM

## 2010-12-20 DIAGNOSIS — Z23 Encounter for immunization: Secondary | ICD-10-CM

## 2010-12-20 DIAGNOSIS — I10 Essential (primary) hypertension: Secondary | ICD-10-CM

## 2010-12-20 MED ORDER — BENAZEPRIL HCL 40 MG PO TABS: 40.0000 mg | ORAL_TABLET | ORAL | Status: DC

## 2010-12-20 MED ORDER — FLUOXETINE HCL 10 MG PO TABS: 10.0000 mg | ORAL_TABLET | Freq: Every day | ORAL | Status: DC

## 2010-12-20 NOTE — Patient Instructions (Addendum)
F/u in 3 months.  Pneumonia vaccine today.  Fasting lipid, , HBA1C , cmp and eGFR in 3 months  It is important that you exercise regularly at least 30 minutes 5 times a week. If you develop chest pain, have severe difficulty breathing, or feel very tired, stop exercising immediately and seek medical attention    Goal for fasting blood sugar ranges from 80 to 120 and 2 hours after any meal or at bedtime should be between 130 to 170. Test once daily please  Pls sched eye exam, and we will defer colonoscopy per your request

## 2010-12-21 ENCOUNTER — Encounter: Payer: Self-pay | Admitting: Family Medicine

## 2010-12-21 NOTE — Assessment & Plan Note (Signed)
Controlled, no change in medication  

## 2010-12-21 NOTE — Assessment & Plan Note (Signed)
Not at goal additional med to be added at next visit if this remains the case

## 2010-12-21 NOTE — Assessment & Plan Note (Signed)
Improved though still unconntrolled, i explained to the pt she needs insulin, wants to work more on lifestyle including going to the [employer] 2 days per week, her ins covers this

## 2010-12-21 NOTE — Progress Notes (Signed)
  Subjective:    Patient ID: [name] [name], female    DOB: November 30, 1945, 65 y.o.   MRN: [id number]  HPI HYPERTENSION Disease Monitoring Blood pressure range-unknown Chest pain- no      Dyspnea- no Medications Compliance- good Lightheadedness- no   Edema- no   DIABETES Disease Monitoring Blood Sugar ranges-fasting avg 130's Polyuria- no New Visual problems- no Medications Compliance- fair as far as diet, needs improvement Hypoglycemic symptoms- no Plans to sched eye exam which is past due   HYPERLIPIDEMIA Disease Monitoring See symptoms for Hypertension Medications Compliance- good  RUQ pain- no  Muscle aches- no     Review of Systems    Denies recent fever or chills. Denies sinus pressure, nasal congestion, ear pain or sore throat. Denies chest congestion, productive cough or wheezing. Denies chest pains, palpitations, paroxysmal nocturnal dyspnea, orthopnea and leg swelling Denies abdominal pain, nausea, vomiting,diarrhea or constipation.  Denies rectal bleeding or change in bowel movement Colonscopy due, but holding on this for now, wants to do eyes first. Denies dysuria, frequency, hesitancy or incontinence. Denies joint pain, swelling and limitation in mobility. Denies headaches, seizure, numbness, or tingling. Denies skin break down or rash. Continues to have anxiety and mild depression, spouse has severe dementia, and she has a very ill daughter    Objective:   Physical Exam Patient alert and oriented and in no Cardiopulmonary distress.  HEENT: No facial asymmetry, EOMI, no sinus tenderness, TM's clear, Oropharynx pink and moist.  Neck supple no adenopathy.  Chest: Clear to auscultation bilaterally.  CVS: S1, S2 no murmurs, no S3.  ABD: Soft non tender. Bowel sounds normal.  Ext: No edema  MS: Adequate ROM spine, shoulders, hips and knees.  Skin: Intact, no ulcerations or rash noted.  Psych: Good eye contact, normal affect. Memory intact not anxious  or depressed appearing.  CNS: CN 2-12 intact, power, tone and sensation normal throughout.  Diabetic Foot Check:  Appearance -  calluses Skin - no unusual pallor or redness Sensation - grossly intact to light touch Monofilament testing -  Right - Great toe, medial, central, lateral ball and posterior foot diminished Left - Great toe, medial, central, lateral ball and posterior foot diminished Pulses Left - Dorsalis Pedis and Posterior Tibia normal Right - Dorsalis Pedis and Posterior Tibia normal        Assessment & Plan:

## 2010-12-28 NOTE — Assessment & Plan Note (Signed)
[place] [facility] OFFICE NOTE   [name] [name], [name] [name]                      MRN:          [id number]  DATE:06/27/2007                            DOB:          07/06/1947    REFERRING PHYSICIAN:  Norwood Levo. Moshe Cipro, M.D.   [name] [name] is a previous patient of Dr. Wilhemina Cash.  She is referred back  by Dr. Moshe Cipro for followup.  She apparently has some bad dentition and  needs her teeth pulled.  Apparently, they felt that she needed clearance  from [place] to do this.  In looking at the patient's chart, she has had a  normal Myoview in 2004.  She has had mild aortic stenosis by  echocardiogram which has not been closely followed.  Coronary risk  factors include diabetes, hypertension, hyperlipidemia.   She has had good medical care with Dr. Moshe Cipro.  She is currently not  having a lot of symptoms.  She is having no chest pain, palpitations or  syncope.  She does get exertional dyspnea that sounds functional.  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  She has no  limitations when she is at work.  She has been compliant with her meds.   The patient has no documented coronary artery disease.  There is a  question of a previous fixed defect by [facility] in October of 2004, but  as far as I can tell she has never had a heart cath.   REVIEW OF SYSTEMS:  Otherwise negative.   MEDICATIONS:  1. Glipizide 20 mg a day.  2. An aspirin a day.  3. Toprol 50 a day.  4. Metformin 1 gram b.i.d.  5. Lovastatin 20 a day.  6. Amlodipine 10 a day.  7. Benazepril 40 a day.   EXAM:  Remarkable for a blood pressure 150/80, pulse is 78 and regular,  she is afebrile, respiratory rate is 14, weight is 208.  Affect is  appropriate.  HEENT:  Unremarkable.  She has bilateral carotid bruits versus  transmitted murmurs.  There is no parvus and no tardus.  There  is no JVP  elevation, lymphadenopathy or thyromegaly.  LUNGS:  Clear with good diaphragmatic motion, no wheezing, there is an  S1, second heart sound is preserved, there is a mild AS murmur, PMI is  not palpable.  ABDOMEN:  Benign, no tenderness, no bruits.  No hepatosplenomegaly, no  hepatojugular reflux, no AAA.  Distal pulses are intact, no edema.  SKIN:  Warm and dry.  NEURO:  Nonfocal.  There is no muscular weakness.   IMPRESSION:  1. Aortic stenosis.  No echocardiogram for the last two and one-half      years.  Does not sound critical by exam.  Follow up echocardiogram.  2. Poor dentition.  I gave her a note that said it was fine for her to      have her teeth pulled.  Per new recommendations, she would not      necessarily need endocarditis prophylaxis, despite her  aortic      stenosis.  There is no critical history of coronary disease and she      is not having chest pain.  3. Hyperlipidemia.  Follow up with Dr. Moshe Cipro.  Continue lovastatin      20 a day.  Lipid and liver profile in six months.  4. Hypertension.  Currently fairly well controlled.  I suspect at some      point she will need to be put on a low-dose diuretic given her body      habitus and propensity towards high blood pressure.  I would      continue current medications and low-salt diet.  5. Diabetes.  Check hemoglobin A1c quarterly, continue oral      hypoglycemics.  6. Carotid bruits.  It is not clear whether these are bruits versus a      transmitted aortic stenosis murmur.  She will have a carotid Duplex      done at the time of her aortic echocardiogram to see whether or      there is evidence of vascular disease.   She is on an aspirin a day and has never had a TIA.   I will see her back in 6 months so long as her echocardiogram has not  shown rapid progression of her AS.     Wallis Bamberg. Johnsie Cancel, MD, [facility]  Electronically Signed    PCN/MedQ  DD: 06/27/2007  DT: 06/28/2007  Job #: [id number]   cc:    Norwood Levo. Moshe Cipro, M.D.

## 2010-12-28 NOTE — Assessment & Plan Note (Signed)
[place] OFFICE NOTE   [name] [name], [name] [name]                      MRN:          [id number]  DATE:07/11/2008                            DOB:          02/10/1946    CARDIOLOGIST:  She had previously been followed by Dr. Wilhemina [name] and Dr.  Johnsie [name].  The patient now be established with Dr. Lattie [name].   PRIMARY CARE PHYSICIAN:  [name] Levo. Moshe Cipro, MD   REASON FOR VISIT:  Post ER visit.   HISTORY OF PRESENT ILLNESS:  [name] is a 65 year old female  patient with history of mild aortic stenosis as well as previously  abnormal Myoview study, but no documented coronary artery disease, who  presents to the office today for followup from post ER visit.  She had a  Myoview study done in 2004 that showed fixed anterior defect.  She has  not had a nuclear study performed since then.  Her last echocardiogram  was done in January of this year and demonstrated normal LV function,  mild-to-moderate aortic sclerosis, and mild aortic stenosis with a peak  gradient of 29 mmHg.  Her cardiac risk factors include diabetes  mellitus, hypertension, hyperlipidemia.  Her husband recently was  diagnosed with prostate cancer.  He has just completed 40 rounds of  radiation treatments.  She has other significant stressors in her life  and has started to note significant palpitations.  She went to the  emergency room on July 02, 2008, at [facility].  Review of  her electrocardiogram from that visit demonstrates sinus rhythm with  occasional PVCs.  She was diagnosed with anxiety and placed on lorazepam  and asked to follow up with [place] today.  She notes palpitations mainly  with stress in her life.  She denies any chest discomfort.  She denies  any exertional chest heaviness or tightness.  She denies any exertional  shortness of breath.  She describes NYHA class II symptoms.  She denies  orthopnea, PND, or pedal edema.  She  denies any syncope.   CURRENT MEDICATIONS:  1. Glipizide 20 mg in the morning.  2. Aspirin 325 mg daily.  3. Metoprolol tartrate 50 mg daily.  4. Crestor.  5. Amlodipine 10 mg daily.  6. Avandamet 11/998 mg b.i.d.  7. Azor 5/20 mg daily.  8. Lorazepam 1 mg p.r.n.   PHYSICAL EXAMINATION:  GENERAL:  She is a well-nourished, well-developed  female in no acute distress.  VITAL SIGNS:  Blood pressure is 140/70, pulse 70, weight 209 pounds.  HEENT:  Normal.  NECK:  Without JVD.  CARDIAC:  Normal S1 and S2, irregular rhythm, 2/6 systolic ejection  murmur best heard at right sternal border.  LUNGS:  Clear to auscultation bilaterally.  ABDOMEN:  Soft, nontender.  EXTREMITIES:  Without edema.  NEUROLOGIC:  She is alert and oriented x3.  Cranial nerves II through  XII grossly intact.   Electrocardiogram reveals sinus rhythm with frequent PVCs in a bigeminal  and trigeminal pattern.  LVH with repolarization abnormality, affective  heart rate is essentially approximately 70 beats per minute.   ASSESSMENT AND PLAN:  1. Palpitations.  I think, she is experiencing symptomatic PVCs as      evidenced by her electrocardiogram today.  She is under quite a bit      of stress and all of her symptoms seem to be related to this.  The      lorazepam has made her significantly hypersomnolent.  I have      suggested that she decrease the dosage to a 1/4 tablet to 1/2      tablet every 8 hours p.r.n.  She can follow up further with Dr.      Moshe [name] regarding anxiolytics.  She is taking her metoprolol      incorrectly and I have changed this to 50 mg b.i.d.  Hopefully,      this will help alleviate some of her symptoms of PVCs.  We will      check labs today that include a followup BMET to reassess her      potassium as well as a magnesium level and TSH.  I will have her      followup in a couple of weeks for blood pressure and heart rate      check in several weeks with Dr. Lattie [name].  At this point, I  do not      think further workup is indicated.  However, if she continues to      have symptoms, we may need to proceed with event monitoring.  2. Mild aortic stenosis.  Her last echocardiogram was in January of      this year.  Her aortic stenosis has been fairly stable over the      last several years.  I would suggest repeating her echocardiogram      in the next 6-12 months.  If she continues to have significant      palpitations, we may want to check her echocardiogram sooner.  3. Hypertension.  As noted above, the metoprolol is being taken it      correctly.  We have adjusted that today.  She also is on amlodipine      in addition to Azor.  I have asked her to stop her amlodipine and      continue the Azor.  She could always have the dosage of the      amlodipine adjusted in the Azor if needed.  4. Dyslipidemia.  She will continue to follow up with Dr. Moshe [name] and      continue on her Crestor.  5. Diabetes mellitus.  She will continue to follow up with Dr. Moshe [name]  6. History of abnormal Myoview.  As noted above, her Myoview in 2004      showed an anterior fixed defect.  She has not had followup testing      since that time.  She is not having symptoms of chest pain at this      point.  No ischemic testing is warranted at this time.  We will      reassess her when she returns in followup in the next several      weeks.   DISPOSITION:  The patient will be brought back in followup with Dr.  Lattie [name] in 4 weeks or sooner p.r.n.      Richardson Dopp, PA-C  Electronically Signed      Cristopher Estimable. [name] Haw, MD, [facility]  Electronically Signed   SW/MedQ  DD: 07/11/2008  DT: 07/11/2008  Job #: [id number]

## 2010-12-28 NOTE — Assessment & Plan Note (Signed)
[place] OFFICE NOTE   [name], [name]                      MRN:          [id number]  DATE:08/13/2007                            DOB:          07/06/1947    [name] [name] returns today for followup.  Unfortunately since I last saw her,  she had a brother in [place] pass.  As a result, she did not have a  followup carotid duplex and ultrasound.  She has bruits in her neck and  an AS murmur, and this needed followup.   Her coronary risk factors include hypercholesterolemia, diabetes, and  hypertension.  She has not had any significant chest pain.  She has been  a bit anxious and out of it since her brother died.  She is still under  a lot of stress from this.   REVIEW OF SYSTEMS:  The patient's review of systems is otherwise  negative.  In particular, she says her sugars have been a little high  while she is under stress.  She has otherwise been compliant with her  medications.   CURRENT MEDICATIONS:  1. Glipizide 20 a day.  2. Aspirin a day.  3. Toprol-XL 50 a day.  4. Metformin 1 gram b.i.d.  5. Lovastatin 20 a day.  6. Amlodipine 10 mg a day.  7. Benazepril 40 a day.   PHYSICAL EXAMINATION:  GENERAL:  Overweight black female with some  anxiety.  VITAL SIGNS:  Weight is 208, blood pressure is 138/88, pulse is 85 and  regular, afebrile, respiratory rate 16.  HEENT:  Unremarkable.  She has transmitted murmurs versus bruits in the  neck, right greater than left.  There is no parvus and no tardus.  No  JVP elevation, lymphadenopathy, or thyromegaly.  No nodules.  LUNGS:  Clear to diaphragmatic motion.  No wheezing.  CARDIAC:  S1 second heart sound is preserved.  There is mild AS murmur  and no aortic insufficiency.  PMI not palpable.  ABDOMEN:  Benign.  Bowel sounds positive.  No AAA.  No bruit.  No  hepatosplenomegaly, hepatojugular reflux.  EXTREMITIES:  Distal reflexes are intact.  No edema.  NEUROLOGIC:  Nonfocal.  SKIN:  Warm and dry.  No muscular weakness.   IMPRESSION:  1. Hypertension, currently well-controlled.  Continue low salt diet.      Continue current dosages of benazepril and amlodipine and consider      adding diuretic in the future.  2. Diabetes.  Hemoglobin A1c quarterly.  Continue current dose of      metformin and avoid Actos.  3. Aortic stenosis, question degree.  Followup echocardiogram.  Second      heart sound preserved.  Doubt critical aortic stenosis.  4. Bruits versus transmitted murmurs in the neck.  Followup carotid      duplex.  Continue baby aspirin a day.  5. History of abnormal Myoview with a fixed apical defect.  Low-risk      study.  Possible followup Myoview in a year.  The patient not      having any chest pain.  Continue risk factor modification.  I will see Dierdre back in 6 months so long as her ultrasounds are low  risk.     Wallis Bamberg. Johnsie Cancel, MD, [facility]  Electronically Signed    PCN/MedQ  DD: 08/13/2007  DT: 08/13/2007  Job #: [id number]

## 2010-12-28 NOTE — Procedures (Signed)
[name] [name], [name] [name]               ACCOUNT NO.:  [id number]   MEDICAL RECORD NO.:  [id number]          PATIENT TYPE:  OUT   LOCATION:  RAD                           FACILITY:  [facility]   PHYSICIAN:  Cristopher Estimable. Lattie Haw, MD, FACCDATE OF BIRTH:  07/06/1947   DATE OF PROCEDURE:  08/21/2007  DATE OF DISCHARGE:                                ECHOCARDIOGRAM   REFERRING:  Dr. Moshe Cipro and Dr. Johnsie Cancel   CLINICAL DATA:  A 65 year old woman with aortic stenosis.  M-mode:  Aorta 2.7, left atrium 4.6, septum 1.5, posterior wall 1.4, LV diastole  4.5, LV systole 3.6.   1. Technically adequate echocardiographic study.  2. Mild left atrial enlargement; right atrial size at the upper limit      of normal.  Normal right ventricular size and function.  3. Mild to moderate sclerosis of the aortic valve; mild impairment in      leaflet separation; mild stenosis with a peak gradient of 29 mmHg.  4. Normal diameter of the proximal ascending aorta; mild calcification      of the wall and annulus.  5. Minimal calcification of the mitral valve with trivial      regurgitation.  6. Normal tricuspid and pulmonic valve; normal proximal pulmonary      artery.  7. Normal left ventricular size; mild hypertrophy; normal regional and      global function.  8. Comparison with prior study of November 15, 2004:  Slight progression      of aortic stenosis.      Cristopher Estimable. Lattie Haw, MD, [facility]  Electronically Signed     RMR/MEDQ  D:  08/22/2007  T:  08/22/2007  Job:  [id number]

## 2010-12-31 NOTE — Procedures (Signed)
   NAME:  [name] [name], [name] [name]                         ACCOUNT NO.:  [id number]   MEDICAL RECORD NO.:  [id number]                   PATIENT TYPE:  OUT   LOCATION:  RAD                                  FACILITY:  [facility]   PHYSICIAN:  Jacqulyn Ducking, M.D.               DATE OF BIRTH:  07/06/1947   DATE OF PROCEDURE:  DATE OF DISCHARGE:                                  ECHOCARDIOGRAM   REFERRING PHYSICIANS:  Drs. Simpson and Lindsay   CLINICAL DATA:  This 65 year old woman with chest pain, hypertension,  diabetes and a murmur.   M-MODE:  AORTA:  2.6  (<4.0)  LEFT ATRIUM:  4.1  (<4.0)  SEPTUM:  1.3  (0.7-1.1)  POSTERIOR WALL:  1.1  (0.7-1.1)  LV-DIASTOLE:  4.2  (<5.7)  LV-SYSTOLE:  3.7  (<4.0)   FINDINGS/IMPRESSION:  1. A technically adequate echocardiographic study.  2. Left atrial size at the upper limit of normal.  Normal right atrium and     right ventricle.  3. Minimal aortic valvular sclerosis with normal leaflet excursion; aortic     flow velocity is slightly increased, but there is no apparent aortic     stenosis.  4. Mild calcification of the wall of the proximal ascending aorta.  5. Normal tricuspid and pulmonic valves.  6. Normal mitral valve; mild annular calcification; trivial regurgitation.  7. Normal internal dimension of the left ventricle; borderline LVH.  Normal     regional and global LV systolic function.  8. Normal IVC.  9. Comparison with prior study of August 27, 1997: No significant interval     change.      ___________________________________________                                            Jacqulyn Ducking, M.D.   RR/MEDQ  D:  05/27/2003  T:  05/27/2003  Job:  [id number]

## 2010-12-31 NOTE — Procedures (Signed)
NAMELESLIEANNE, VOLNER               ACCOUNT NO.:  [id number]   MEDICAL RECORD NO.:  [id number]          PATIENT TYPE:  OUT   LOCATION:  RAD                           FACILITY:  [facility]   PHYSICIAN:  Scarlett Presto, M.D.   DATE OF BIRTH:  07/06/1947   DATE OF PROCEDURE:  11/15/2004  DATE OF DISCHARGE:                                  ECHOCARDIOGRAM   ECHOCARDIOGRAM:   PRIMARY CARE PHYSICIAN:  Dr. Moshe Cipro.   Today is November 15, 2004.  This is a 65 year old woman with a murmur.  Tape  #[id number], tape count [id number].  Previous echocardiogram from October 2004  shows normal left ventricular systolic function, borderline LVH mildly  increased velocities across the aortic valve without apparent stenosis.   Today's study is technically difficult.   M Mode tracings:   The aorta is 26 millimeters.   Left atrium 44 millimeters.   The septum is 15 millimeters.   The posterior wall is 13 millimeters.   Left ventricular diastolic dimension 32 millimeters.   Left ventricular systolic dimension 29 millimeters.   2D and Doppler imaging -- the left ventricle is normal size with normal  systolic function, estimated ejection fraction 55-60%, there are no regional  wall motion abnormalities, there is mild to moderate concentric left  ventricular hypertrophy.   The right ventricle is normal size with normal systolic function.   There is biatrial enlargement.   The aortic valve is sclerotic with a velocity of about 2.7 meters per second  across the aortic valve giving a peak gradient of 29 mmHg consistent with  mild aortic stenosis.  There is no insufficiency seen.   The mitral valve is mildly thickened with mild regurgitation, no stenosis is  seen.   The tricuspid valve has mild regurgitation.   There is no pericardial effusion.   The ascending aorta was not well seen.   ASSESSMENT:  No significant change from previous echocardiogram with a  possible section of slightly increased  velocities across the aortic valve.      JH/MEDQ  D:  11/15/2004  T:  11/15/2004  Job:  [id number]   cc:   Norwood Levo. Moshe Cipro, M.D.  [address]  [place], [place] [address]  Fax: [phone]

## 2010-12-31 NOTE — Group Therapy Note (Signed)
   NAME:  ANTANAE, KOEPP                         ACCOUNT NO.:  [id number]   MEDICAL RECORD NO.:  [id number]                   PATIENT TYPE:  OUT   LOCATION:  RAD                                  FACILITY:   PHYSICIAN:  Scarlett Presto, M.D.                DATE OF BIRTH:  07/06/1947   DATE OF PROCEDURE:  DATE OF DISCHARGE:                                   PROGRESS NOTE   PROCEDURE:  Adenosine Cardiolite.   INDICATION:  [name] is a 65 year old female with no known coronary  artery disease who presents with complaints of weakness, lightheadedness,  and generalized discomfort.  Her cardiac risk factors include diabetes,  hypertension, hyperlipidemia and obesity.  She does have a holosystolic  murmur which is being evaluated with an echocardiogram.   BASELINE DATA:  EKG reveals sinus rhythm at 71 beats per minute with  nonspecific ST abnormalities.  Blood pressure is 112/78.   Fifty-two mg of adenosine was infused over four minute protocol with  Cardiolite injected at three minutes.  The patient complained of flushing,  abdominal tightness, shortness of breath.  All of which resolved in  recovery.  EKG revealed few missed atrial beats during infusion of adenosine  with no ischemic changes noted.   Tested was stopped secondary to protocol being completed.   Final images and results are pending M.D. review.     ________________________________________  ___________________________________________  Cherre Blanc, P.A. [facility]                      Scarlett Presto, M.D.   AB/MEDQ  D:  05/27/2003  T:  05/27/2003  Job:  [id number]

## 2011-03-18 ENCOUNTER — Encounter: Payer: Self-pay | Admitting: Family Medicine

## 2011-03-21 ENCOUNTER — Encounter: Payer: Self-pay | Admitting: Family Medicine

## 2011-03-22 ENCOUNTER — Ambulatory Visit (INDEPENDENT_AMBULATORY_CARE_PROVIDER_SITE_OTHER): Payer: Medicare PPO | Admitting: Family Medicine

## 2011-03-22 ENCOUNTER — Encounter: Payer: Self-pay | Admitting: Family Medicine

## 2011-03-22 VITALS — BP 124/82 | HR 83 | Resp 16 | Ht 65.25 in | Wt 193.4 lb

## 2011-03-22 DIAGNOSIS — F3289 Other specified depressive episodes: Secondary | ICD-10-CM

## 2011-03-22 DIAGNOSIS — G4700 Insomnia, unspecified: Secondary | ICD-10-CM

## 2011-03-22 DIAGNOSIS — E785 Hyperlipidemia, unspecified: Secondary | ICD-10-CM

## 2011-03-22 DIAGNOSIS — I10 Essential (primary) hypertension: Secondary | ICD-10-CM

## 2011-03-22 DIAGNOSIS — E119 Type 2 diabetes mellitus without complications: Secondary | ICD-10-CM

## 2011-03-22 DIAGNOSIS — F329 Major depressive disorder, single episode, unspecified: Secondary | ICD-10-CM

## 2011-03-22 LAB — LIPID PANEL
Cholesterol: 146 mg/dL (ref 0–200)
HDL: 42 mg/dL
LDL Cholesterol: 85 mg/dL (ref 0–99)
Total CHOL/HDL Ratio: 3.5 ratio
Triglycerides: 97 mg/dL
VLDL: 19 mg/dL (ref 0–40)

## 2011-03-22 LAB — BASIC METABOLIC PANEL WITHOUT GFR
BUN: 15 mg/dL (ref 6–23)
CO2: 26 meq/L (ref 19–32)
Calcium: 9.8 mg/dL (ref 8.4–10.5)
Chloride: 104 meq/L (ref 96–112)
Creat: 0.74 mg/dL (ref 0.50–1.10)
GFR, Est African American: >60 mL/min
GFR, Est Non African American: >60 mL/min
Glucose, Bld: 148 mg/dL — ABNORMAL HIGH (ref 70–99)
Potassium: 4.3 meq/L (ref 3.5–5.3)
Sodium: 140 meq/L (ref 135–145)

## 2011-03-22 LAB — HEMOGLOBIN A1C
Hgb A1c MFr Bld: 7.4 % — ABNORMAL HIGH (ref <5.7)
Mean Plasma Glucose: 166 mg/dL — ABNORMAL HIGH (ref <117)

## 2011-03-22 MED ORDER — TEMAZEPAM 15 MG PO CAPS: 15.0000 mg | ORAL_CAPSULE | Freq: Every evening | ORAL | Status: DC | PRN

## 2011-03-22 MED ORDER — BENAZEPRIL HCL 40 MG PO TABS: 40.0000 mg | ORAL_TABLET | ORAL | Status: DC

## 2011-03-22 MED ORDER — AMLODIPINE BESYLATE 10 MG PO TABS: 10.0000 mg | ORAL_TABLET | Freq: Every day | ORAL | Status: DC

## 2011-03-22 NOTE — Assessment & Plan Note (Signed)
Improved, though still uncontrolled

## 2011-03-22 NOTE — Assessment & Plan Note (Signed)
Controlled, no change in medication  

## 2011-03-22 NOTE — Patient Instructions (Signed)
F/u in 3 months.  I am extremely sorry about the loss of your daughter, and hope that over time you will recover, and start feeling better.  Pls practice good sleep hygiene, we will give you info on this, also med sent in to help with sleep.  Important to take fluoxetine every day , and if you decide on therapy call and let me know. I do believe this will help a lot and highly recommend it.  Embrace the loving concern of your sons.   Blood sugar has improved.  Pls eat regularly and start regular exercise.  Cholesterol is good , but bad cholesterol still higher than desired, reduce fatty food and red meat.   HBa1C in 3 months.

## 2011-03-22 NOTE — Assessment & Plan Note (Signed)
Deteriorated due to acute loss of her daughter, encouraged therapy, no med change at this time

## 2011-03-22 NOTE — Assessment & Plan Note (Signed)
Controlled, no change in medication Encouraged lower fat diet to further improve LDL

## 2011-03-22 NOTE — Progress Notes (Signed)
  Subjective:    Patient ID: [name] [name], female    DOB: Mar 03, 1946, 65 y.o.   MRN: [id number]  HPI HYPERTENSION  Disease Monitoring  Blood pressure range-unknown Chest pain- no      Dyspnea- no  Compliance- good Lightheadedness- no   Edema- no  DIABETES  Disease Monitoring  Blood Sugar ranges-fastings avg 130 to 140 Polyuria- no New Visual problems- has new prescription for glases  Compliance- good Hypoglycemic symptoms- yes, not eating regularly, mourning death of her daughter, 23  Last eye exam: 01/2011      Podiatry visit: needed  HYPERLIPIDEMIA  Compliance- good RUQ pain- no  Muscle aches- no   REGULAR EXERCISE-no Pt suffering with grief  Due to the untimely death of heronly daughter with severe asthma and HTN  And  Diabetes. She is having difficulty both falling and staying asleep. Unfortunately she does sleep with the tV on, I have advised her tyo stop. She does not want therapy at this time      Review of Systems See HPI Denies recent fever or chills. Denies sinus pressure, nasal congestion, ear pain or sore throat. Denies chest congestion, productive cough or wheezing. Denies chest pains, palpitations and leg swelling Denies abdominal pain, nausea, vomiting,diarrhea or constipation.   Denies dysuria, frequency, hesitancy or incontinence. Denies joint pain, swelling and limitation in mobility.  Denies skin break down or rash.        Objective:   Physical Exam Patient alert and oriented and in no cardiopulmonary distress.  HEENT: No facial asymmetry, EOMI, no sinus tenderness,  oropharynx pink and moist.  Neck supple no adenopathy.  Chest: Clear to auscultation bilaterally.  CVS: S1, S2 no murmurs, no S3.  ABD: Soft non tender. Bowel sounds normal.  Ext: No edema  MS: Adequate ROM spine, shoulders, hips and knees.  Skin: Intact, no ulcerations or rash noted.  Psych: Good eye contact, flat affect. Memory intact tearful and  depressed  appearing.  CNS: CN 2-12 intact, power, tone and sensation normal throughout.  Diabetic Foot Check:  Appearance -  Calluses present  Skin - no unusual pallor or redness Sensation - grossly intact to light touch Monofilament testing -  Right - Great toe, medial, central, lateral ball and posterior foot diminished Left - Great toe, medial, central, lateral ball and posterior foot diminished Pulses Left - Dorsalis Pedis and Posterior Tibia normal Right - Dorsalis Pedis and Posterior Tibia normal         Assessment & Plan:

## 2011-03-22 NOTE — Assessment & Plan Note (Signed)
Sleep hygiene discussed and literature given. Short course of restoril prescribed due to acute grief

## 2011-05-17 LAB — DIFFERENTIAL
Basophils Absolute: 0
Basophils Relative: 1
Eosinophils Absolute: 0.1
Eosinophils Relative: 1
Lymphocytes Relative: 16
Lymphs Abs: 1.4
Monocytes Absolute: 0.5
Monocytes Relative: 6
Neutro Abs: 6.4
Neutrophils Relative %: 76

## 2011-05-17 LAB — BASIC METABOLIC PANEL
BUN: 12
CO2: 24
Calcium: 9.7
Chloride: 106
Creatinine, Ser: 0.7
GFR calc Af Amer: >60
GFR calc non Af Amer: >60
Glucose, Bld: 195 — ABNORMAL HIGH
Potassium: 3.5
Sodium: 137

## 2011-05-17 LAB — CBC
HCT: 38.1
Hemoglobin: 12.5
MCHC: 32.9
MCV: 81.5
Platelets: 257
RBC: 4.67
RDW: 15.1
WBC: 8.4

## 2011-05-17 LAB — POCT CARDIAC MARKERS
CKMB, poc: 2.1
CKMB, poc: 2.6
Myoglobin, poc: 34.9
Myoglobin, poc: 55
Troponin i, poc: <0.05
Troponin i, poc: <0.05

## 2011-06-29 ENCOUNTER — Encounter: Payer: Self-pay | Admitting: Family Medicine

## 2011-06-30 ENCOUNTER — Other Ambulatory Visit: Payer: Self-pay | Admitting: Family Medicine

## 2011-06-30 ENCOUNTER — Ambulatory Visit (INDEPENDENT_AMBULATORY_CARE_PROVIDER_SITE_OTHER): Payer: Medicare PPO | Admitting: Family Medicine

## 2011-06-30 ENCOUNTER — Encounter: Payer: Self-pay | Admitting: Family Medicine

## 2011-06-30 VITALS — BP 110/80 | HR 76 | Resp 16 | Ht 65.25 in | Wt 190.4 lb

## 2011-06-30 DIAGNOSIS — I10 Essential (primary) hypertension: Secondary | ICD-10-CM

## 2011-06-30 DIAGNOSIS — R5381 Other malaise: Secondary | ICD-10-CM

## 2011-06-30 DIAGNOSIS — J309 Allergic rhinitis, unspecified: Secondary | ICD-10-CM

## 2011-06-30 DIAGNOSIS — F3289 Other specified depressive episodes: Secondary | ICD-10-CM

## 2011-06-30 DIAGNOSIS — R5383 Other fatigue: Secondary | ICD-10-CM

## 2011-06-30 DIAGNOSIS — E785 Hyperlipidemia, unspecified: Secondary | ICD-10-CM

## 2011-06-30 DIAGNOSIS — Z23 Encounter for immunization: Secondary | ICD-10-CM

## 2011-06-30 DIAGNOSIS — F329 Major depressive disorder, single episode, unspecified: Secondary | ICD-10-CM

## 2011-06-30 DIAGNOSIS — E119 Type 2 diabetes mellitus without complications: Secondary | ICD-10-CM

## 2011-06-30 LAB — CBC WITH DIFFERENTIAL/PLATELET
Basophils Absolute: 0.1 10*3/uL (ref 0.0–0.1)
Basophils Relative: 2 % — ABNORMAL HIGH (ref 0–1)
Eosinophils Absolute: 0.3 10*3/uL (ref 0.0–0.7)
Eosinophils Relative: 5 % (ref 0–5)
HCT: 33.4 % — ABNORMAL LOW (ref 36.0–46.0)
Hemoglobin: 10.1 g/dL — ABNORMAL LOW (ref 12.0–15.0)
Lymphocytes Relative: 31 % (ref 12–46)
Lymphs Abs: 1.8 10*3/uL (ref 0.7–4.0)
MCH: 22.1 pg — ABNORMAL LOW (ref 26.0–34.0)
MCHC: 30.2 g/dL (ref 30.0–36.0)
MCV: 72.9 fL — ABNORMAL LOW (ref 78.0–100.0)
Monocytes Absolute: 0.5 10*3/uL (ref 0.1–1.0)
Monocytes Relative: 9 % (ref 3–12)
Neutro Abs: 3.1 10*3/uL (ref 1.7–7.7)
Neutrophils Relative %: 54 % (ref 43–77)
Platelets: 349 10*3/uL (ref 150–400)
RBC: 4.58 MIL/uL (ref 3.87–5.11)
RDW: 19.7 % — ABNORMAL HIGH (ref 11.5–15.5)
WBC: 5.7 10*3/uL (ref 4.0–10.5)

## 2011-06-30 LAB — HEMOGLOBIN A1C
Hgb A1c MFr Bld: 7 % — ABNORMAL HIGH (ref <5.7)
Mean Plasma Glucose: 154 mg/dL — ABNORMAL HIGH (ref <117)

## 2011-06-30 MED ORDER — PSEUDOEPHEDRINE HCL 30 MG PO TABS: 30.0000 mg | ORAL_TABLET | ORAL | Status: AC | PRN

## 2011-06-30 MED ORDER — FLUTICASONE PROPIONATE 50 MCG/ACT NA SUSP: 2.0000 | Freq: Every day | NASAL | Status: DC

## 2011-06-30 MED ORDER — AMLODIPINE BESYLATE 10 MG PO TABS: 10.0000 mg | ORAL_TABLET | Freq: Every day | ORAL | Status: DC

## 2011-06-30 MED ORDER — CETIRIZINE HCL 10 MG PO TABS: 10.0000 mg | ORAL_TABLET | Freq: Every day | ORAL | Status: DC

## 2011-06-30 MED ORDER — BENAZEPRIL HCL 40 MG PO TABS: 40.0000 mg | ORAL_TABLET | ORAL | Status: DC

## 2011-06-30 NOTE — Assessment & Plan Note (Signed)
Uncontrolled when last checked,pt reports not testing often and not eating regularly, depressed , missing her daughter, HBa1C today

## 2011-06-30 NOTE — Patient Instructions (Addendum)
F/u in 4 months.  Lab today.HBA1C, CBC, TSH  Fasting lipid, cmp and EGFR , HBA1C in 4 month  Pls test blood sugars regularly  Goal for fasting blood sugar ranges from 80 to 120 and 2 hours after any meal or at bedtime should be between 130 to 170.  TdaP and flu vaccines today.  New medications for uncontrolled allergies, fluticasone, zyrtec and sudafed. Use salt water nasal flushes allso.  All the besrt for the rest of the year, and call if you need me before

## 2011-06-30 NOTE — Assessment & Plan Note (Signed)
Hyperlipidemia:Low fat diet discussed and encouraged.  Controlled when last checked

## 2011-06-30 NOTE — Progress Notes (Signed)
  Subjective:    Patient ID: [name] [name], female    DOB: 1945-10-22, 65 y.o.   MRN: [id number]  HPI 2 week hj/o uncontrolled nasal congestion , with sneezing, watery eyes and cough. Drainage is clear, she denies any fever or chills, she has no sputum production. Pt tests blood sugars infrequently, and fasting sugars are around 130 She continues to grieve and understandably so over , the early death of her only daughter in her  66's. She states her symptoms are improving, she does use the prozac more days than not, she does not feel she needs therapy    Review of Systems See HPI Denies recent fever or chills. Denies sinus pressure,  ear pain or sore throat. Denies chest congestion, productive cough or wheezing. Denies chest pains, palpitations and leg swelling Denies abdominal pain, nausea, vomiting,diarrhea or constipation.   Denies dysuria, frequency, hesitancy or incontinence. Denies joint pain, swelling and limitation in mobility. Denies headaches, seizures, numbness, or tingling.  Denies skin break down or rash.        Objective:   Physical Exam  Patient alert and oriented and in no cardiopulmonary distress.  HEENT: No facial asymmetry, EOMI, no sinus tenderness,  oropharynx pink and moist.  Neck supple no adenopathy.Erythema and edema of nasal mucosa and excessive tearing of the eyes noted  Chest: Clear to auscultation bilaterally.  CVS: S1, S2 no murmurs, no S3.  ABD: Soft non tender. Bowel sounds normal.  Ext: No edema  MS: Adequate ROM spine, shoulders, hips and knees.  Skin: Intact, no ulcerations or rash noted.  Psych: Good eye contact, flat  affect. Memory intact mildly depressed appearing.  CNS: CN 2-12 intact, power, tone and sensation normal throughout.  Diabetic Foot Check:  Appearance - no lesions, ulcers or calluses Skin - no unusual pallor or redness, bilateral onychomycosis Sensation - grossly intact to light touch Monofilament testing -    Right - Great toe, medial, central, lateral ball and posterior foot intact, mildly diminished Left - Great toe, medial, central, lateral ball and posterior foot intact, mildly diminished Pulses Left - Dorsalis Pedis and Posterior Tibia normal Right - Dorsalis Pedis and Posterior Tibia normal      Assessment & Plan:

## 2011-06-30 NOTE — Assessment & Plan Note (Signed)
Improved, pt needs time to overcome the grief of losing her child, she is encouraged to take the prozac daily

## 2011-06-30 NOTE — Assessment & Plan Note (Addendum)
Uncontrolled, med prescribed, nasal spray, sudafed and zyrtec

## 2011-06-30 NOTE — Assessment & Plan Note (Signed)
Controlled, no change in medication  

## 2011-07-01 LAB — TSH: TSH: 1.83 u[IU]/mL (ref 0.350–4.500)

## 2011-07-06 LAB — VITAMIN B12: Vitamin B-12: 347 pg/mL (ref 211–911)

## 2011-07-06 LAB — FERRITIN: Ferritin: 6 ng/mL — ABNORMAL LOW (ref 10–291)

## 2011-07-06 LAB — IRON AND TIBC
%SAT: 7 % — ABNORMAL LOW (ref 20–55)
Iron: 30 ug/dL — ABNORMAL LOW (ref 42–145)
TIBC: 409 ug/dL (ref 250–470)
UIBC: 379 ug/dL (ref 125–400)

## 2011-07-06 LAB — FOLATE: Folate: 16.9 ng/mL

## 2011-07-14 ENCOUNTER — Other Ambulatory Visit: Payer: Self-pay

## 2011-07-14 MED ORDER — FERROUS SULFATE 325 (65 FE) MG PO TBEC: 325.0000 mg | DELAYED_RELEASE_TABLET | Freq: Two times a day (BID) | ORAL | Status: DC

## 2011-08-01 ENCOUNTER — Telehealth: Payer: Self-pay | Admitting: Family Medicine

## 2011-08-01 ENCOUNTER — Other Ambulatory Visit: Payer: Self-pay | Admitting: Family Medicine

## 2011-08-01 MED ORDER — BENZONATATE 100 MG PO CAPS: 100.0000 mg | ORAL_CAPSULE | Freq: Four times a day (QID) | ORAL | Status: DC | PRN

## 2011-08-01 NOTE — Telephone Encounter (Signed)
Received something for congestion when here last time. Now she has some cough and runny nose. She was taking taking tylenol sinus and congestion. States when she holds her head down and cough it makes her head hurt. Wants to know what else she needs to do. Some color to the drainage but not dark

## 2011-08-01 NOTE — Telephone Encounter (Signed)
pls advise tessalon perles have been sent in good chest decongestant, use OTC sudafed one daily for the next 3 to 5 days to reduce drainage, drink a lot of water

## 2011-08-01 NOTE — Telephone Encounter (Signed)
Patient aware.

## 2011-08-16 DIAGNOSIS — J189 Pneumonia, unspecified organism: Secondary | ICD-10-CM

## 2011-08-16 HISTORY — DX: Pneumonia, unspecified organism: J18.9

## 2011-09-15 ENCOUNTER — Encounter: Payer: Self-pay | Admitting: Cardiology

## 2011-09-16 ENCOUNTER — Ambulatory Visit: Payer: Medicare PPO | Admitting: Cardiology

## 2011-09-23 ENCOUNTER — Encounter: Payer: Self-pay | Admitting: Cardiology

## 2011-09-26 ENCOUNTER — Ambulatory Visit: Payer: Medicare PPO | Admitting: Cardiology

## 2011-10-14 DIAGNOSIS — I429 Cardiomyopathy, unspecified: Secondary | ICD-10-CM | POA: Insufficient documentation

## 2011-10-14 DIAGNOSIS — I428 Other cardiomyopathies: Secondary | ICD-10-CM | POA: Insufficient documentation

## 2011-10-28 ENCOUNTER — Encounter (HOSPITAL_COMMUNITY): Payer: Self-pay

## 2011-10-28 ENCOUNTER — Observation Stay (HOSPITAL_COMMUNITY)
Admission: EM | Admit: 2011-10-28 | Discharge: 2011-10-29 | DRG: 195 | Disposition: A | Discharge status: Home / Self Care | Payer: Medicare Other | Attending: Internal Medicine | Admitting: Internal Medicine

## 2011-10-28 ENCOUNTER — Emergency Department (HOSPITAL_COMMUNITY): Payer: Medicare Other

## 2011-10-28 DIAGNOSIS — E1121 Type 2 diabetes mellitus with diabetic nephropathy: Secondary | ICD-10-CM | POA: Diagnosis present

## 2011-10-28 DIAGNOSIS — R8290 Unspecified abnormal findings in urine: Secondary | ICD-10-CM | POA: Diagnosis present

## 2011-10-28 DIAGNOSIS — E119 Type 2 diabetes mellitus without complications: Secondary | ICD-10-CM | POA: Diagnosis present

## 2011-10-28 DIAGNOSIS — I10 Essential (primary) hypertension: Secondary | ICD-10-CM | POA: Diagnosis present

## 2011-10-28 DIAGNOSIS — E876 Hypokalemia: Secondary | ICD-10-CM | POA: Diagnosis present

## 2011-10-28 DIAGNOSIS — E785 Hyperlipidemia, unspecified: Secondary | ICD-10-CM | POA: Diagnosis present

## 2011-10-28 DIAGNOSIS — J189 Pneumonia, unspecified organism: Principal | ICD-10-CM | POA: Diagnosis present

## 2011-10-28 DIAGNOSIS — D649 Anemia, unspecified: Secondary | ICD-10-CM | POA: Diagnosis present

## 2011-10-28 DIAGNOSIS — R0602 Shortness of breath: Secondary | ICD-10-CM | POA: Diagnosis present

## 2011-10-28 DIAGNOSIS — I359 Nonrheumatic aortic valve disorder, unspecified: Secondary | ICD-10-CM | POA: Diagnosis present

## 2011-10-28 LAB — URINALYSIS, ROUTINE W REFLEX MICROSCOPIC
Bilirubin Urine: NEGATIVE
Glucose, UA: 100 mg/dL — AB
Ketones, ur: NEGATIVE mg/dL
Nitrite: NEGATIVE
Protein, ur: NEGATIVE mg/dL
Specific Gravity, Urine: 1.01 (ref 1.005–1.030)
Urobilinogen, UA: 0.2 mg/dL (ref 0.0–1.0)
pH: 6 (ref 5.0–8.0)

## 2011-10-28 LAB — CBC
HCT: 36.9 % (ref 36.0–46.0)
Hemoglobin: 11.9 g/dL — ABNORMAL LOW (ref 12.0–15.0)
MCH: 24.4 pg — ABNORMAL LOW (ref 26.0–34.0)
MCHC: 32.2 g/dL (ref 30.0–36.0)
MCV: 75.8 fL — ABNORMAL LOW (ref 78.0–100.0)
Platelets: 320 10*3/uL (ref 150–400)
RBC: 4.87 MIL/uL (ref 3.87–5.11)
RDW: 18.8 % — ABNORMAL HIGH (ref 11.5–15.5)
WBC: 12.1 10*3/uL — ABNORMAL HIGH (ref 4.0–10.5)

## 2011-10-28 LAB — URINE MICROSCOPIC-ADD ON

## 2011-10-28 LAB — BASIC METABOLIC PANEL
BUN: 11 mg/dL (ref 6–23)
CO2: 26 mEq/L (ref 19–32)
Calcium: 10.3 mg/dL (ref 8.4–10.5)
Chloride: 101 mEq/L (ref 96–112)
Creatinine, Ser: 0.76 mg/dL (ref 0.50–1.10)
GFR calc Af Amer: >90 mL/min (ref >90)
GFR calc non Af Amer: 86 mL/min — ABNORMAL LOW (ref >90)
Glucose, Bld: 195 mg/dL — ABNORMAL HIGH (ref 70–99)
Potassium: 3 mEq/L — ABNORMAL LOW (ref 3.5–5.1)
Sodium: 137 mEq/L (ref 135–145)

## 2011-10-28 LAB — DIFFERENTIAL
Basophils Absolute: 0.1 10*3/uL (ref 0.0–0.1)
Basophils Relative: 1 % (ref 0–1)
Eosinophils Absolute: 0.2 10*3/uL (ref 0.0–0.7)
Eosinophils Relative: 2 % (ref 0–5)
Lymphocytes Relative: 16 % (ref 12–46)
Lymphs Abs: 1.9 10*3/uL (ref 0.7–4.0)
Monocytes Absolute: 0.5 10*3/uL (ref 0.1–1.0)
Monocytes Relative: 4 % (ref 3–12)
Neutro Abs: 9.5 10*3/uL — ABNORMAL HIGH (ref 1.7–7.7)
Neutrophils Relative %: 78 % — ABNORMAL HIGH (ref 43–77)

## 2011-10-28 LAB — PROCALCITONIN: Procalcitonin: <0.05 ng/mL

## 2011-10-28 LAB — LACTIC ACID, PLASMA: Lactic Acid, Venous: 2.3 mmol/L — ABNORMAL HIGH (ref 0.5–2.2)

## 2011-10-28 MED ORDER — POTASSIUM CHLORIDE CRYS ER 20 MEQ PO TBCR
40.0000 meq | EXTENDED_RELEASE_TABLET | Freq: Once | ORAL | Status: AC
  Administered 2011-10-28: 40 meq via ORAL
  Filled 2011-10-28: qty 2

## 2011-10-28 MED ORDER — METOPROLOL SUCCINATE ER 25 MG PO TB24
37.5000 mg | ORAL_TABLET | Freq: Two times a day (BID) | ORAL | Status: DC
  Administered 2011-10-28 – 2011-10-29 (×2): 37.5 mg via ORAL
  Filled 2011-10-28 (×2): qty 2

## 2011-10-28 MED ORDER — ONDANSETRON HCL 4 MG PO TABS: 4.0000 mg | ORAL_TABLET | Freq: Four times a day (QID) | ORAL | Status: DC | PRN

## 2011-10-28 MED ORDER — POTASSIUM CHLORIDE IN NACL 20-0.9 MEQ/L-% IV SOLN
INTRAVENOUS | Status: DC
  Administered 2011-10-28 – 2011-10-29 (×2): via INTRAVENOUS

## 2011-10-28 MED ORDER — ONDANSETRON HCL 4 MG/2ML IJ SOLN: 4.0000 mg | Freq: Four times a day (QID) | INTRAMUSCULAR | Status: DC | PRN

## 2011-10-28 MED ORDER — LINAGLIPTIN 5 MG PO TABS
5.0000 mg | ORAL_TABLET | Freq: Two times a day (BID) | ORAL | Status: DC
  Administered 2011-10-29: 5 mg via ORAL
  Filled 2011-10-28: qty 1

## 2011-10-28 MED ORDER — ACETAMINOPHEN 325 MG PO TABS: 650.0000 mg | ORAL_TABLET | Freq: Four times a day (QID) | ORAL | Status: DC | PRN

## 2011-10-28 MED ORDER — IPRATROPIUM BROMIDE 0.02 % IN SOLN
0.5000 mg | Freq: Once | RESPIRATORY_TRACT | Status: AC
  Administered 2011-10-28: 0.5 mg via RESPIRATORY_TRACT
  Filled 2011-10-28: qty 2.5

## 2011-10-28 MED ORDER — METFORMIN HCL 500 MG PO TABS
1000.0000 mg | ORAL_TABLET | Freq: Two times a day (BID) | ORAL | Status: DC
  Administered 2011-10-29: 1000 mg via ORAL
  Filled 2011-10-28: qty 2

## 2011-10-28 MED ORDER — ALBUTEROL SULFATE (5 MG/ML) 0.5% IN NEBU: 2.5000 mg | INHALATION_SOLUTION | RESPIRATORY_TRACT | Status: DC

## 2011-10-28 MED ORDER — ACETAMINOPHEN 650 MG RE SUPP: 650.0000 mg | Freq: Four times a day (QID) | RECTAL | Status: DC | PRN

## 2011-10-28 MED ORDER — ENOXAPARIN SODIUM 40 MG/0.4ML ~~LOC~~ SOLN
40.0000 mg | SUBCUTANEOUS | Status: DC
  Administered 2011-10-28: 40 mg via SUBCUTANEOUS
  Filled 2011-10-28: qty 0.4

## 2011-10-28 MED ORDER — SITAGLIPTIN PHOS-METFORMIN HCL 50-1000 MG PO TABS: 1.0000 | ORAL_TABLET | Freq: Two times a day (BID) | ORAL | Status: DC

## 2011-10-28 MED ORDER — AMLODIPINE BESYLATE 5 MG PO TABS
10.0000 mg | ORAL_TABLET | Freq: Every day | ORAL | Status: DC
  Administered 2011-10-29: 10 mg via ORAL
  Filled 2011-10-28 (×2): qty 2
  Filled 2011-10-28: qty 1

## 2011-10-28 MED ORDER — FLUOXETINE HCL 10 MG PO CAPS
10.0000 mg | ORAL_CAPSULE | Freq: Every day | ORAL | Status: DC
  Filled 2011-10-28 (×7): qty 1

## 2011-10-28 MED ORDER — ONDANSETRON HCL 4 MG/2ML IJ SOLN: 4.0000 mg | Freq: Three times a day (TID) | INTRAMUSCULAR | Status: DC | PRN

## 2011-10-28 MED ORDER — LEVOFLOXACIN IN D5W 750 MG/150ML IV SOLN
750.0000 mg | Freq: Once | INTRAVENOUS | Status: AC
  Administered 2011-10-28: 750 mg via INTRAVENOUS
  Filled 2011-10-28: qty 150

## 2011-10-28 MED ORDER — MOXIFLOXACIN HCL IN NACL 400 MG/250ML IV SOLN
400.0000 mg | INTRAVENOUS | Status: DC
  Administered 2011-10-28: 400 mg via INTRAVENOUS
  Filled 2011-10-28 (×3): qty 250

## 2011-10-28 MED ORDER — ACETAMINOPHEN 500 MG PO TABS
1000.0000 mg | ORAL_TABLET | Freq: Once | ORAL | Status: AC
  Administered 2011-10-28: 1000 mg via ORAL
  Filled 2011-10-28: qty 2

## 2011-10-28 MED ORDER — ASPIRIN 325 MG PO TABS
325.0000 mg | ORAL_TABLET | Freq: Every day | ORAL | Status: DC
  Administered 2011-10-28: 325 mg via ORAL
  Filled 2011-10-28: qty 1

## 2011-10-28 MED ORDER — INSULIN ASPART 100 UNIT/ML ~~LOC~~ SOLN
0.0000 [IU] | Freq: Three times a day (TID) | SUBCUTANEOUS | Status: DC
  Administered 2011-10-29 (×2): 2 [IU] via SUBCUTANEOUS

## 2011-10-28 MED ORDER — ALBUTEROL SULFATE (5 MG/ML) 0.5% IN NEBU
5.0000 mg | INHALATION_SOLUTION | Freq: Once | RESPIRATORY_TRACT | Status: AC
  Administered 2011-10-28: 5 mg via RESPIRATORY_TRACT
  Filled 2011-10-28: qty 1

## 2011-10-28 MED ORDER — ATORVASTATIN CALCIUM 40 MG PO TABS
80.0000 mg | ORAL_TABLET | Freq: Every day | ORAL | Status: DC
  Administered 2011-10-28: 80 mg via ORAL
  Filled 2011-10-28: qty 2

## 2011-10-28 MED ORDER — MOXIFLOXACIN HCL IN NACL 400 MG/250ML IV SOLN
INTRAVENOUS | Status: AC
  Filled 2011-10-28: qty 250

## 2011-10-28 MED ORDER — ALBUTEROL SULFATE (5 MG/ML) 0.5% IN NEBU
2.5000 mg | INHALATION_SOLUTION | Freq: Four times a day (QID) | RESPIRATORY_TRACT | Status: DC
  Administered 2011-10-28 – 2011-10-29 (×3): 2.5 mg via RESPIRATORY_TRACT
  Filled 2011-10-28 (×3): qty 0.5

## 2011-10-28 NOTE — H&P (Signed)
[name] [name] is an 66 y.o. female.    PCP: [name], MD, MD   Chief Complaint: Cough, shortness of breath  HPI: This is a 66 year old, African American female, with a past medical history of hypertension, diabetes, aortic stenosis, who was in her usual state of health till yesterday when she started having cold like symptoms. She started having wheezing and a cough with whitish expectoration. Denies any blood in the sputum. And, today she was short of breath, and so decided to come in to the hospital. She tells me that one of her granddaughter was sick with cold like symptoms. She also had a fever, but denies any chest pain. She did get a flu shot this season. She didn't she has some nausea, but denies any vomiting. Denies any abdominal pain, or dysuria. Did have some loose stools today.   Home Medications: Prior to Admission medications   Medication Sig Start Date End Date Taking? Authorizing Provider  amLODipine (NORVASC) 10 MG tablet Take 10 mg by mouth every morning. Take one tablet by mouth once a day discontinue azor 06/30/11  Yes Fayrene Helper, MD  aspirin 325 MG tablet Take 325 mg by mouth at bedtime. Take one tablet by mouth daily     Yes Historical Provider, MD  benazepril (LOTENSIN) 40 MG tablet Take 40 mg by mouth every morning. Take one tablet by mouth once a day discontinue azor 06/30/11  Yes Fayrene Helper, MD  glipiZIDE (GLIPIZIDE XL) 10 MG 24 hr tablet Take 20 mg by mouth every morning. Two tabs by mouth once a day   Yes Historical Provider, MD  metoprolol succinate (TOPROL-XL) 25 MG 24 hr tablet Take 37.5 mg by mouth 2 (two) times daily. One and half tab daily 11/29/10  Yes Fayrene Helper, MD  Multiple Vitamin (MULITIVITAMIN WITH MINERALS) TABS Take 1 tablet by mouth every morning.   Yes Historical Provider, MD  rosuvastatin (CRESTOR) 40 MG tablet Take 40 mg by mouth at bedtime.    Yes Historical Provider, MD  sitaGLIPtan-metformin (JANUMET) 50-1000 MG  per tablet Take 1 tablet by mouth 2 (two) times daily with meals. Take one tablet by mouth two times a day    Yes Historical Provider, MD  FLUoxetine (PROZAC) 10 MG tablet Take 1 tablet (10 mg total) by mouth daily. Take one capsule by mouth once a day 12/20/10   Fayrene Helper, MD    Allergies: No Known Allergies  Past Medical History: Past Medical History  Diagnosis Date  . Hypertension   . Hyperlipidemia   . Diabetes mellitus, type 2   . Anxiety   . Aortic stenosis 2009    Echocardiogram 2009-normal EF, mild AS; negative stress nuclear in 2004; negative carotid duplex study in 2009    Past Surgical History  Procedure Date  . Tubal ligation   . Cesarean section     Social History:  reports that she has never smoked. She has never used smokeless tobacco. She reports that she does not drink alcohol or use illicit drugs.  Family History:  Family History  Problem Relation Age of Onset  . Arthritis Other     Family History  . Heart disease Mother   . Heart attack Mother   . Cancer Father     colon  . Cancer Sister     breast  . Diabetes Brother   . Hypertension Brother   . Stroke Brother     Review of Systems - History  obtained from the patient General ROS: positive for  - fatigue Psychological ROS: negative Ophthalmic ROS: negative ENT ROS: negative Allergy and Immunology ROS: negative Hematological and Lymphatic ROS: negative Endocrine ROS: negative Respiratory ROS: as in hpi Cardiovascular ROS: negative Gastrointestinal ROS: as in hpi Genito-Urinary ROS: negative Musculoskeletal ROS: negative Neurological ROS: negative Dermatological ROS: negative  Physical Examination Blood pressure 134/75, pulse 99, temperature 100.1 F (37.8 C), temperature source Oral, resp. rate 16, height 5\' 7"  (1.702 m), weight 87.544 kg (193 lb), SpO2 95.00%.  General appearance: alert, cooperative, appears stated age and no distress Head: Normocephalic, without obvious  abnormality, atraumatic Eyes: conjunctivae/corneas clear. PERRL, EOM's intact. Throat: lips, mucosa, and tongue normal; teeth and gums normal Neck: no adenopathy, no carotid bruit, no JVD, supple, symmetrical, trachea midline and thyroid not enlarged, symmetric, no tenderness/mass/nodules Back: symmetric, no curvature. ROM normal. No CVA tenderness. Resp: Crackles at the right base. No significant wheezing. Cardio: regular rate and rhythm, S1 normal, S2 is split, systolic murmur at 2nd ICS on right, click, rub or gallop GI: soft, non-tender; bowel sounds normal; no masses,  no organomegaly Extremities: extremities normal, atraumatic, no cyanosis or edema Pulses: 2+ and symmetric Skin: Skin color, texture, turgor normal. No rashes or lesions Lymph nodes: Cervical, supraclavicular, and axillary nodes normal. Neurologic: Grossly normal  Laboratory Data: Results for orders placed during the hospital encounter of 10/28/11 (from the past 48 hour(s))  URINALYSIS, ROUTINE W REFLEX MICROSCOPIC     Status: Abnormal   Collection Time   10/28/11  4:11 PM      Component Value Range Comment   Color, Urine YELLOW  YELLOW     APPearance CLEAR  CLEAR     Specific Gravity, Urine 1.010  1.005 - 1.030     pH 6.0  5.0 - 8.0     Glucose, UA 100 (*) NEGATIVE (mg/dL)    Hgb urine dipstick TRACE (*) NEGATIVE     Bilirubin Urine NEGATIVE  NEGATIVE     Ketones, ur NEGATIVE  NEGATIVE (mg/dL)    Protein, ur NEGATIVE  NEGATIVE (mg/dL)    Urobilinogen, UA 0.2  0.0 - 1.0 (mg/dL)    Nitrite NEGATIVE  NEGATIVE     Leukocytes, UA SMALL (*) NEGATIVE    URINE MICROSCOPIC-ADD ON     Status: Abnormal   Collection Time   10/28/11  4:11 PM      Component Value Range Comment   Squamous Epithelial / LPF RARE  RARE     WBC, UA 11-20  <3 (WBC/hpf)    RBC / HPF 3-6  <3 (RBC/hpf)    Bacteria, UA FEW (*) RARE    CBC     Status: Abnormal   Collection Time   10/28/11  4:21 PM      Component Value Range Comment   WBC 12.1 (*)  4.0 - 10.5 (K/uL)    RBC 4.87  3.87 - 5.11 (MIL/uL)    Hemoglobin 11.9 (*) 12.0 - 15.0 (g/dL)    HCT 36.9  36.0 - 46.0 (%)    MCV 75.8 (*) 78.0 - 100.0 (fL)    MCH 24.4 (*) 26.0 - 34.0 (pg)    MCHC 32.2  30.0 - 36.0 (g/dL)    RDW 18.8 (*) 11.5 - 15.5 (%)    Platelets 320  150 - 400 (K/uL)   DIFFERENTIAL     Status: Abnormal   Collection Time   10/28/11  4:21 PM      Component Value Range Comment  Neutrophils Relative 78 (*) 43 - 77 (%)    Neutro Abs 9.5 (*) 1.7 - 7.7 (K/uL)    Lymphocytes Relative 16  12 - 46 (%)    Lymphs Abs 1.9  0.7 - 4.0 (K/uL)    Monocytes Relative 4  3 - 12 (%)    Monocytes Absolute 0.5  0.1 - 1.0 (K/uL)    Eosinophils Relative 2  0 - 5 (%)    Eosinophils Absolute 0.2  0.0 - 0.7 (K/uL)    Basophils Relative 1  0 - 1 (%)    Basophils Absolute 0.1  0.0 - 0.1 (K/uL)   BASIC METABOLIC PANEL     Status: Abnormal   Collection Time   10/28/11  4:21 PM      Component Value Range Comment   Sodium 137  135 - 145 (mEq/L)    Potassium 3.0 (*) 3.5 - 5.1 (mEq/L)    Chloride 101  96 - 112 (mEq/L)    CO2 26  19 - 32 (mEq/L)    Glucose, Bld 195 (*) 70 - 99 (mg/dL)    BUN 11  6 - 23 (mg/dL)    Creatinine, Ser 0.76  0.50 - 1.10 (mg/dL)    Calcium 10.3  8.4 - 10.5 (mg/dL)    GFR calc non Af Amer 86 (*) >90 (mL/min)    GFR calc Af Amer >90  >90 (mL/min)   PROCALCITONIN     Status: Normal   Collection Time   10/28/11  4:21 PM      Component Value Range Comment   Procalcitonin <0.05     LACTIC ACID, PLASMA     Status: Abnormal   Collection Time   10/28/11  4:21 PM      Component Value Range Comment   Lactic Acid, Venous 2.3 (*) 0.5 - 2.2 (mmol/L)     Radiology Reports: Dg Chest 2 View  10/28/2011  *RADIOLOGY REPORT*  Clinical Data: Shortness of breath.  CHEST - 2 VIEW  Comparison: None.  Findings: Bilateral lower lung infiltrates are noted.  These appear to be within the right middle lobe and lingula.  Findings concerning for multifocal pneumonia.  Heart is borderline  in size. No effusions or acute bony abnormality.  IMPRESSION: Right middle lobe and lingular opacities concerning for pneumonia.  Original Report Authenticated By: Raelyn Number, M.D.    Assessment/Plan  Principal Problem:  *CAP (community acquired pneumonia) Active Problems:  DIABETES MELLITUS, TYPE II  HYPERTENSION  Aortic stenosis  Hypokalemia  Anemia  Abnormal urinalysis   #1 community-acquired pneumonia. Will be treated with Avelox. We will go ahead check influenza panel as well. Oxygen will be provided as needed. Nebulizer treatments will be given as well. Give her IV fluids.  #2 hypokalemia. Will be repleted intravenously and orally.  #3 anemia: Anemia panel will be checked.  #4 history of, diabetes. We will check HbA1c. We'll put her on a sliding scale insulin.  #5 history of hypertension. Continue with anti hypertensive agents. And hold off on the ARB for now.  #6 history of aortic stenosis, is stable. Monitor closely. Last known EF in 2009, was normal.  #7 abnormal urinalysis: Will follow. Urine cultures.  DVT, prophylaxis will be initiated.  Further management decisions will depend on results of further testing and patient's response to treatment.  [facility] Pager [phone]  10/28/2011, 8:22 PM

## 2011-10-28 NOTE — ED Notes (Signed)
Sick since yesterday w/ cough and fever

## 2011-10-28 NOTE — ED Provider Notes (Signed)
History     CSN: [id number]  Arrival date & time 10/28/11  1518   First MD Initiated Contact with Patient 10/28/11 1546      Chief Complaint  Patient presents with  . Cough  . Fever    HPI Pt was seen at 1545.  Per pt, c/o gradual onset and worsening of persistent SOB, cough and "wheezing" for the past several days, worse today.  Has been assoc with subjective home fevers and generalized weakness/fatigue.  Denies CP/palpitations, no abd pain, no N/V/D, no rash, no back pain.   Past Medical History  Diagnosis Date  . Hypertension   . Hyperlipidemia   . Diabetes mellitus, type 2   . Anxiety   . Aortic stenosis 2009    Echocardiogram 2009-normal EF, mild AS; negative stress nuclear in 2004; negative carotid duplex study in 2009    Past Surgical History  Procedure Date  . Tubal ligation   . Cesarean section     Family History  Problem Relation Age of Onset  . Arthritis Other     Family History  . Heart disease Mother   . Cancer Father     colon  . Cancer Sister     breast  . Diabetes Brother   . Hypertension Brother   . Stroke Brother     History  Substance Use Topics  . Smoking status: Never Smoker   . Smokeless tobacco: Never Used  . Alcohol Use: No    Review of Systems ROS: Statement: All systems negative except as marked or noted in the HPI; Constitutional: +fever and chills, generalized weakness/fatigue. ; ; Eyes: Negative for eye pain, redness and discharge. ; ; ENMT: Negative for ear pain, hoarseness, nasal congestion, sinus pressure and sore throat. ; ; Cardiovascular: Negative for chest pain, palpitations, diaphoresis, and peripheral edema. ; ; Respiratory: +cough, wheezing, SOB. Negative for stridor. ; ; Gastrointestinal: Negative for nausea, vomiting, diarrhea, abdominal pain, blood in stool, hematemesis, jaundice and rectal bleeding. . ; ; Genitourinary: Negative for dysuria, flank pain and hematuria. ; ; Musculoskeletal: Negative for back pain and neck  pain. Negative for swelling and trauma.; ; Skin: Negative for pruritus, rash, abrasions, blisters, bruising and skin lesion.; ; Neuro: Negative for headache, lightheadedness and neck stiffness. Negative for altered level of consciousness , altered mental status, extremity weakness, paresthesias, involuntary movement, seizure and syncope.     Allergies  Review of patient's allergies indicates no known allergies.  Home Medications   Current Outpatient Rx  Name Route Sig Dispense Refill  . AMLODIPINE BESYLATE 10 MG PO TABS Oral Take 10 mg by mouth every morning. Take one tablet by mouth once a day discontinue azor    . ASPIRIN 325 MG PO TABS Oral Take 325 mg by mouth at bedtime. Take one tablet by mouth daily      . BENAZEPRIL HCL 40 MG PO TABS Oral Take 40 mg by mouth every morning. Take one tablet by mouth once a day discontinue azor    . GLIPIZIDE ER 10 MG PO TB24 Oral Take 20 mg by mouth every morning. Two tabs by mouth once a day    . METOPROLOL SUCCINATE ER 25 MG PO TB24 Oral Take 37.5 mg by mouth 2 (two) times daily. One and half tab daily    . ADULT MULTIVITAMIN W/MINERALS CH Oral Take 1 tablet by mouth every morning.    Marland Kitchen ROSUVASTATIN CALCIUM 40 MG PO TABS Oral Take 40 mg by mouth at bedtime.     Marland Kitchen  SITAGLIPTIN-METFORMIN HCL 50-1000 MG PO TABS Oral Take 1 tablet by mouth 2 (two) times daily with meals. Take one tablet by mouth two times a day     . FLUOXETINE HCL 10 MG PO TABS Oral Take 1 tablet (10 mg total) by mouth daily. Take one capsule by mouth once a day 30 tablet 3    BP 128/69  Pulse 105  Temp(Src) 101.1 F (38.4 C) (Oral)  Resp 16  Ht 5\' 7"  (1.702 m)  Wt 193 lb (87.544 kg)  BMI 30.23 kg/m2  SpO2 92%  Physical Exam 1550: Physical examination:  Nursing notes reviewed; Vital signs and O2 SAT reviewed;  Constitutional: Well developed, Well nourished, Well hydrated, In no acute distress; Head:  Normocephalic, atraumatic; Eyes: EOMI, PERRL, No scleral icterus; ENMT: Mouth  and pharynx normal, Mucous membranes moist; Neck: Supple, Full range of motion, No lymphadenopathy; Cardiovascular: Regular rate and rhythm, No murmur, rub, or gallop; Respiratory: Breath sounds coarse & equal bilaterally, with scattered insp/exp wheezes, no audible wheezing, speaking full sentences, Normal respiratory effort/excursion; Chest: Nontender, Movement normal; Abdomen: Soft, Nontender, Nondistended, Normal bowel sounds; Extremities: Pulses normal, No tenderness, No edema, No calf edema or asymmetry.; Neuro: AA&Ox3, Major CN grossly intact. Speech clear, no facial droop.  No gross focal motor or sensory deficits in extremities.; Skin: Color normal, Warm, Dry, no rash.    ED Course  Procedures   MDM  MDM Reviewed: nursing note and vitals Interpretation: labs and x-ray   Results for orders placed during the hospital encounter of 10/28/11  CBC      Component Value Range   WBC 12.1 (*) 4.0 - 10.5 (K/uL)   RBC 4.87  3.87 - 5.11 (MIL/uL)   Hemoglobin 11.9 (*) 12.0 - 15.0 (g/dL)   HCT 36.9  36.0 - 46.0 (%)   MCV 75.8 (*) 78.0 - 100.0 (fL)   MCH 24.4 (*) 26.0 - 34.0 (pg)   MCHC 32.2  30.0 - 36.0 (g/dL)   RDW 18.8 (*) 11.5 - 15.5 (%)   Platelets 320  150 - 400 (K/uL)  DIFFERENTIAL      Component Value Range   Neutrophils Relative 78 (*) 43 - 77 (%)   Neutro Abs 9.5 (*) 1.7 - 7.7 (K/uL)   Lymphocytes Relative 16  12 - 46 (%)   Lymphs Abs 1.9  0.7 - 4.0 (K/uL)   Monocytes Relative 4  3 - 12 (%)   Monocytes Absolute 0.5  0.1 - 1.0 (K/uL)   Eosinophils Relative 2  0 - 5 (%)   Eosinophils Absolute 0.2  0.0 - 0.7 (K/uL)   Basophils Relative 1  0 - 1 (%)   Basophils Absolute 0.1  0.0 - 0.1 (K/uL)  BASIC METABOLIC PANEL      Component Value Range   Sodium 137  135 - 145 (mEq/L)   Potassium 3.0 (*) 3.5 - 5.1 (mEq/L)   Chloride 101  96 - 112 (mEq/L)   CO2 26  19 - 32 (mEq/L)   Glucose, Bld 195 (*) 70 - 99 (mg/dL)   BUN 11  6 - 23 (mg/dL)   Creatinine, Ser 0.76  0.50 - 1.10 (mg/dL)     Calcium 10.3  8.4 - 10.5 (mg/dL)   GFR calc non Af Amer 86 (*) >90 (mL/min)   GFR calc Af Amer >90  >90 (mL/min)  PROCALCITONIN      Component Value Range   Procalcitonin <0.05    LACTIC ACID, PLASMA  Component Value Range   Lactic Acid, Venous 2.3 (*) 0.5 - 2.2 (mmol/L)  URINALYSIS, ROUTINE W REFLEX MICROSCOPIC      Component Value Range   Color, Urine YELLOW  YELLOW    APPearance CLEAR  CLEAR    Specific Gravity, Urine 1.010  1.005 - 1.030    pH 6.0  5.0 - 8.0    Glucose, UA 100 (*) NEGATIVE (mg/dL)   Hgb urine dipstick TRACE (*) NEGATIVE    Bilirubin Urine NEGATIVE  NEGATIVE    Ketones, ur NEGATIVE  NEGATIVE (mg/dL)   Protein, ur NEGATIVE  NEGATIVE (mg/dL)   Urobilinogen, UA 0.2  0.0 - 1.0 (mg/dL)   Nitrite NEGATIVE  NEGATIVE    Leukocytes, UA SMALL (*) NEGATIVE   URINE MICROSCOPIC-ADD ON      Component Value Range   Squamous Epithelial / LPF RARE  RARE    WBC, UA 11-20  <3 (WBC/hpf)   RBC / HPF 3-6  <3 (RBC/hpf)   Bacteria, UA FEW (*) RARE    Dg Chest 2 View 10/28/2011  *RADIOLOGY REPORT*  Clinical Data: Shortness of breath.  CHEST - 2 VIEW  Comparison: None.  Findings: Bilateral lower lung infiltrates are noted.  These appear to be within the right middle lobe and lingula.  Findings concerning for multifocal pneumonia.  Heart is borderline in size. No effusions or acute bony abnormality.  IMPRESSION: Right middle lobe and lingular opacities concerning for pneumonia.  Original Report Authenticated By: Raelyn Number, M.D.    [place]:  Pt ambulated with Sats decreasing to 90% R/A, improves to 92-94% R/A when back at rest.  IV levaquin ordered for CAP; which will also cover for +UTI as UC pending.  APAP given for fever.  Dx testing d/w pt and family.  Questions answered.  Verb understanding, agreeable to admit.  T/C to [facility], case discussed, including:  HPI, pertinent PM/SHx, VS/PE, dx testing, ED course and treatment:  Agreeable to admit, requests to write  temporary orders, obtain medical bed.          Alfonzo Feller, DO 10/30/11 1422

## 2011-10-28 NOTE — ED Notes (Signed)
Pt here with cough and fever. Pt states the cough has been going on a couple of days, but fever since yesterday.

## 2011-10-29 LAB — COMPREHENSIVE METABOLIC PANEL
ALT: 15 U/L (ref 0–35)
AST: 15 U/L (ref 0–37)
Albumin: 3.5 g/dL (ref 3.5–5.2)
Alkaline Phosphatase: 54 U/L (ref 39–117)
BUN: 7 mg/dL (ref 6–23)
CO2: 28 mEq/L (ref 19–32)
Calcium: 10 mg/dL (ref 8.4–10.5)
Chloride: 106 mEq/L (ref 96–112)
Creatinine, Ser: 0.78 mg/dL (ref 0.50–1.10)
GFR calc Af Amer: >90 mL/min (ref >90)
GFR calc non Af Amer: 85 mL/min — ABNORMAL LOW (ref >90)
Glucose, Bld: 133 mg/dL — ABNORMAL HIGH (ref 70–99)
Potassium: 3.9 mEq/L (ref 3.5–5.1)
Sodium: 140 mEq/L (ref 135–145)
Total Bilirubin: 0.3 mg/dL (ref 0.3–1.2)
Total Protein: 7.3 g/dL (ref 6.0–8.3)

## 2011-10-29 LAB — GLUCOSE, CAPILLARY
Glucose-Capillary: 146 mg/dL — ABNORMAL HIGH (ref 70–99)
Glucose-Capillary: 147 mg/dL — ABNORMAL HIGH (ref 70–99)

## 2011-10-29 LAB — RETICULOCYTES
RBC.: 4.51 MIL/uL (ref 3.87–5.11)
Retic Count, Absolute: 54.1 10*3/uL (ref 19.0–186.0)
Retic Ct Pct: 1.2 % (ref 0.4–3.1)

## 2011-10-29 LAB — INFLUENZA PANEL BY PCR (TYPE A & B)
H1N1 flu by pcr: NOT DETECTED
Influenza A By PCR: NEGATIVE
Influenza B By PCR: NEGATIVE

## 2011-10-29 LAB — CBC
HCT: 34.5 % — ABNORMAL LOW (ref 36.0–46.0)
Hemoglobin: 11.1 g/dL — ABNORMAL LOW (ref 12.0–15.0)
MCH: 24.6 pg — ABNORMAL LOW (ref 26.0–34.0)
MCHC: 32.2 g/dL (ref 30.0–36.0)
MCV: 76.5 fL — ABNORMAL LOW (ref 78.0–100.0)
Platelets: 287 10*3/uL (ref 150–400)
RBC: 4.51 MIL/uL (ref 3.87–5.11)
RDW: 18.8 % — ABNORMAL HIGH (ref 11.5–15.5)
WBC: 8.5 10*3/uL (ref 4.0–10.5)

## 2011-10-29 LAB — IRON AND TIBC
Iron: 21 ug/dL — ABNORMAL LOW (ref 42–135)
Saturation Ratios: 6 % — ABNORMAL LOW (ref 20–55)
TIBC: 349 ug/dL (ref 250–470)
UIBC: 328 ug/dL (ref 125–400)

## 2011-10-29 LAB — FERRITIN: Ferritin: 30 ng/mL (ref 10–291)

## 2011-10-29 LAB — VITAMIN B12: Vitamin B-12: 291 pg/mL (ref 211–911)

## 2011-10-29 LAB — HEMOGLOBIN A1C
Hgb A1c MFr Bld: 6.2 % — ABNORMAL HIGH (ref <5.7)
Mean Plasma Glucose: 131 mg/dL — ABNORMAL HIGH (ref <117)

## 2011-10-29 LAB — FOLATE: Folate: >20 ng/mL

## 2011-10-29 MED ORDER — LEVOFLOXACIN 500 MG PO TABS: 500.0000 mg | ORAL_TABLET | Freq: Every day | ORAL | Status: AC

## 2011-10-29 MED ORDER — ALBUTEROL SULFATE HFA 108 (90 BASE) MCG/ACT IN AERS: 2.0000 | INHALATION_SPRAY | Freq: Four times a day (QID) | RESPIRATORY_TRACT | Status: DC | PRN

## 2011-10-29 MED ORDER — ACETAMINOPHEN 325 MG PO TABS: 650.0000 mg | ORAL_TABLET | Freq: Four times a day (QID) | ORAL | Status: DC | PRN

## 2011-10-29 MED ORDER — BIOTENE DRY MOUTH MT LIQD
15.0000 mL | Freq: Two times a day (BID) | OROMUCOSAL | Status: DC
  Administered 2011-10-29: 15 mL via OROMUCOSAL

## 2011-10-29 MED ORDER — ALBUTEROL SULFATE HFA 108 (90 BASE) MCG/ACT IN AERS
2.0000 | INHALATION_SPRAY | Freq: Once | RESPIRATORY_TRACT | Status: AC
  Administered 2011-10-29: 2 via RESPIRATORY_TRACT
  Filled 2011-10-29: qty 6.7

## 2011-10-29 NOTE — Progress Notes (Signed)
10/29/11 1421 Patient discharged home, reviewed discharge instructions with patient, given copy of instructions, med list, prescriptions, f/u appointment information. Verbalized understanding. IV site d/c'd within normal limits, denies pain, discomfort, or shortness of breath at this time. Received inhaler education per RT as ordered. Pt in stable condition awaiting transport home.

## 2011-10-29 NOTE — Discharge Instructions (Signed)

## 2011-10-29 NOTE — Discharge Summary (Signed)
Physician Discharge Summary  Patient ID: [name] [name] MRN: [id number] DOB/AGE: Nov 12, 1945 66 y.o.  Admit date: 10/28/2011 Discharge date: 10/29/2011  Discharge Diagnoses:  Principal Problem:  *CAP (community acquired pneumonia) Active Problems:  DIABETES MELLITUS, TYPE II  HYPERTENSION  Aortic stenosis  Hypokalemia  Anemia  Abnormal urinalysis   Medication List  As of 10/29/2011  1:45 PM   TAKE these medications         acetaminophen 325 MG tablet   Commonly known as: TYLENOL   Take 2 tablets (650 mg total) by mouth every 6 (six) hours as needed (or Fever >/= 101).      albuterol 108 (90 BASE) MCG/ACT inhaler   Commonly known as: PROVENTIL HFA;VENTOLIN HFA   Inhale 2 puffs into the lungs every 6 (six) hours as needed for wheezing.      amLODipine 10 MG tablet   Commonly known as: NORVASC   Take 10 mg by mouth every morning. Take one tablet by mouth once a day discontinue azor      aspirin 325 MG tablet   Take 325 mg by mouth at bedtime. Take one tablet by mouth daily         benazepril 40 MG tablet   Commonly known as: LOTENSIN   Take 40 mg by mouth every morning. Take one tablet by mouth once a day discontinue azor      FLUoxetine 10 MG tablet   Commonly known as: PROZAC   Take 1 tablet (10 mg total) by mouth daily. Take one capsule by mouth once a day      GLIPIZIDE XL 10 MG 24 hr tablet   Generic drug: glipiZIDE   Take 20 mg by mouth every morning. Two tabs by mouth once a day      levofloxacin 500 MG tablet   Commonly known as: LEVAQUIN   Take 1 tablet (500 mg total) by mouth daily.      metoprolol succinate 25 MG 24 hr tablet   Commonly known as: TOPROL-XL   Take 37.5 mg by mouth 2 (two) times daily. One and half tab daily      mulitivitamin with minerals Tabs   Take 1 tablet by mouth every morning.      rosuvastatin 40 MG tablet   Commonly known as: CRESTOR   Take 40 mg by mouth at bedtime.      sitaGLIPtan-metformin 50-1000 MG per tablet   Commonly known as: JANUMET   Take 1 tablet by mouth 2 (two) times daily with meals. Take one tablet by mouth two times a day            Discharge Orders    Future Appointments: Provider: Department: Dept Phone: Center:   10/31/2011 9:30 AM Fayrene Helper, MD [facility]   11/03/2011 10:20 AM Lendon Colonel, NP [facility] [phone]     Future Orders Please Complete By Expires   Diet - low sodium heart healthy      Diet Carb Modified      Increase activity slowly         Follow-up Information    Follow up with [name], MD. Call in 1 week.   Contact information:   [address] [place] [phone]          Disposition: home  Discharged Condition: stable  Consults:  none  Labs:   Results for orders placed during the hospital encounter of 10/28/11 (from  the past 48 hour(s))  URINALYSIS, ROUTINE W REFLEX MICROSCOPIC     Status: Abnormal   Collection Time   10/28/11  4:11 PM      Component Value Range Comment   Color, Urine YELLOW  YELLOW     APPearance CLEAR  CLEAR     Specific Gravity, Urine 1.010  1.005 - 1.030     pH 6.0  5.0 - 8.0     Glucose, UA 100 (*) NEGATIVE (mg/dL)    Hgb urine dipstick TRACE (*) NEGATIVE     Bilirubin Urine NEGATIVE  NEGATIVE     Ketones, ur NEGATIVE  NEGATIVE (mg/dL)    Protein, ur NEGATIVE  NEGATIVE (mg/dL)    Urobilinogen, UA 0.2  0.0 - 1.0 (mg/dL)    Nitrite NEGATIVE  NEGATIVE     Leukocytes, UA SMALL (*) NEGATIVE    URINE MICROSCOPIC-ADD ON     Status: Abnormal   Collection Time   10/28/11  4:11 PM      Component Value Range Comment   Squamous Epithelial / LPF RARE  RARE     WBC, UA 11-20  <3 (WBC/hpf)    RBC / HPF 3-6  <3 (RBC/hpf)    Bacteria, UA FEW (*) RARE    CBC     Status: Abnormal   Collection Time   10/28/11  4:21 PM      Component Value Range Comment   WBC 12.1 (*) 4.0 - 10.5 (K/uL)    RBC 4.87  3.87 - 5.11 (MIL/uL)     Hemoglobin 11.9 (*) 12.0 - 15.0 (g/dL)    HCT 36.9  36.0 - 46.0 (%)    MCV 75.8 (*) 78.0 - 100.0 (fL)    MCH 24.4 (*) 26.0 - 34.0 (pg)    MCHC 32.2  30.0 - 36.0 (g/dL)    RDW 18.8 (*) 11.5 - 15.5 (%)    Platelets 320  150 - 400 (K/uL)   DIFFERENTIAL     Status: Abnormal   Collection Time   10/28/11  4:21 PM      Component Value Range Comment   Neutrophils Relative 78 (*) 43 - 77 (%)    Neutro Abs 9.5 (*) 1.7 - 7.7 (K/uL)    Lymphocytes Relative 16  12 - 46 (%)    Lymphs Abs 1.9  0.7 - 4.0 (K/uL)    Monocytes Relative 4  3 - 12 (%)    Monocytes Absolute 0.5  0.1 - 1.0 (K/uL)    Eosinophils Relative 2  0 - 5 (%)    Eosinophils Absolute 0.2  0.0 - 0.7 (K/uL)    Basophils Relative 1  0 - 1 (%)    Basophils Absolute 0.1  0.0 - 0.1 (K/uL)   BASIC METABOLIC PANEL     Status: Abnormal   Collection Time   10/28/11  4:21 PM      Component Value Range Comment   Sodium 137  135 - 145 (mEq/L)    Potassium 3.0 (*) 3.5 - 5.1 (mEq/L)    Chloride 101  96 - 112 (mEq/L)    CO2 26  19 - 32 (mEq/L)    Glucose, Bld 195 (*) 70 - 99 (mg/dL)    BUN 11  6 - 23 (mg/dL)    Creatinine, Ser 0.76  0.50 - 1.10 (mg/dL)    Calcium 10.3  8.4 - 10.5 (mg/dL)    GFR calc non Af Amer 86 (*) >90 (mL/min)    GFR calc Af Amer >  90  >90 (mL/min)   PROCALCITONIN     Status: Normal   Collection Time   10/28/11  4:21 PM      Component Value Range Comment   Procalcitonin <0.05     LACTIC ACID, PLASMA     Status: Abnormal   Collection Time   10/28/11  4:21 PM      Component Value Range Comment   Lactic Acid, Venous 2.3 (*) 0.5 - 2.2 (mmol/L)   INFLUENZA PANEL BY PCR     Status: Normal   Collection Time   10/29/11  6:04 AM      Component Value Range Comment   Influenza A By PCR NEGATIVE  NEGATIVE     Influenza B By PCR NEGATIVE  NEGATIVE     H1N1 flu by pcr NOT DETECTED  NOT DETECTED    COMPREHENSIVE METABOLIC PANEL     Status: Abnormal   Collection Time   10/29/11  6:35 AM      Component Value Range Comment    Sodium 140  135 - 145 (mEq/L)    Potassium 3.9  3.5 - 5.1 (mEq/L) DELTA CHECK NOTED   Chloride 106  96 - 112 (mEq/L)    CO2 28  19 - 32 (mEq/L)    Glucose, Bld 133 (*) 70 - 99 (mg/dL)    BUN 7  6 - 23 (mg/dL)    Creatinine, Ser 0.78  0.50 - 1.10 (mg/dL)    Calcium 10.0  8.4 - 10.5 (mg/dL)    Total Protein 7.3  6.0 - 8.3 (g/dL)    Albumin 3.5  3.5 - 5.2 (g/dL)    AST 15  0 - 37 (U/L)    ALT 15  0 - 35 (U/L)    Alkaline Phosphatase 54  39 - 117 (U/L)    Total Bilirubin 0.3  0.3 - 1.2 (mg/dL)    GFR calc non Af Amer 85 (*) >90 (mL/min)    GFR calc Af Amer >90  >90 (mL/min)   CBC     Status: Abnormal   Collection Time   10/29/11  6:35 AM      Component Value Range Comment   WBC 8.5  4.0 - 10.5 (K/uL)    RBC 4.51  3.87 - 5.11 (MIL/uL)    Hemoglobin 11.1 (*) 12.0 - 15.0 (g/dL)    HCT 34.5 (*) 36.0 - 46.0 (%)    MCV 76.5 (*) 78.0 - 100.0 (fL)    MCH 24.6 (*) 26.0 - 34.0 (pg)    MCHC 32.2  30.0 - 36.0 (g/dL)    RDW 18.8 (*) 11.5 - 15.5 (%)    Platelets 287  150 - 400 (K/uL)   RETICULOCYTES     Status: Normal   Collection Time   10/29/11  6:35 AM      Component Value Range Comment   Retic Ct Pct 1.2  0.4 - 3.1 (%)    RBC. 4.51  3.87 - 5.11 (MIL/uL)    Retic Count, Manual 54.1  19.0 - 186.0 (K/uL)   GLUCOSE, CAPILLARY     Status: Abnormal   Collection Time   10/29/11  7:53 AM      Component Value Range Comment   Glucose-Capillary 146 (*) 70 - 99 (mg/dL)    Comment 1 Documented in Chart      Comment 2 Notify RN     GLUCOSE, CAPILLARY     Status: Abnormal   Collection Time   10/29/11 11:03 AM  Component Value Range Comment   Glucose-Capillary 147 (*) 70 - 99 (mg/dL)    Comment 1 Documented in Chart      Comment 2 Notify RN       Diagnostics:  Dg Chest 2 View  10/28/2011  *RADIOLOGY REPORT*  Clinical Data: Shortness of breath.  CHEST - 2 VIEW  Comparison: None.  Findings: Bilateral lower lung infiltrates are noted.  These appear to be within the right middle lobe and lingula.   Findings concerning for multifocal pneumonia.  Heart is borderline in size. No effusions or acute bony abnormality.  IMPRESSION: Right middle lobe and lingular opacities concerning for pneumonia.  Original Report Authenticated By: Raelyn Number, M.D.   Full Code   Hospital Course: See H&P for complete admission details. [name] [name] is a pleasant 66 year old black female who presents with fever cough wheezing shortness of breath and weakness. In the emergency room, she had a fever of 100.1. Otherwise unremarkable vital signs. According to ED physician, she had wheezing and rhonchi. Chest x-ray showed bilateral lower lung infiltrates concerning for multifocal pneumonia. White blood cell count was 12,000. Potassium was 3.0. She received a breathing treatment and antibiotics in the emergency room but became very dyspneic when walking around.   She was admitted overnight and bronchodilators were continued. At the time of discharge, she is feeling better, has clear lung sounds and requesting discharge. Flu swab is negative for influenza A and B. Oxygen saturation is normal. Her exercise tolerance is good. Total time on the day of discharge is greater than 30 minutes.  Discharge Exam:  Blood pressure 105/58, pulse 80, temperature 98.2 F (36.8 C), temperature source Oral, resp. rate 20, height 5\' 7"  (1.702 m), weight 83.7 kg (184 lb 8.4 oz), SpO2 92.00%.  General: Comfortable. Lungs clear to auscultation bilaterally without wheeze rhonchi or rales Cardiovascular regular rate rhythm without murmurs gas rubs Abdomen soft nontender nondistended Extremities no clubbing cyanosis or edema   Signed: Sanchez Hemmer L 10/29/2011, 1:45 PM

## 2011-10-29 NOTE — Progress Notes (Signed)
10/29/11 1328 Patient ambulated in hallway from her room to opposite end of floor, past nurses station, and back to room with staff supervision. Tolerated well, mild shortness of breath with activity per patient. O2 sats at 92% on r/a after ambulation. Will notify MD.

## 2011-10-31 ENCOUNTER — Ambulatory Visit: Payer: Medicare PPO | Admitting: Family Medicine

## 2011-10-31 LAB — URINE CULTURE
Colony Count: >=100000
Culture  Setup Time: 201303160224

## 2011-11-03 ENCOUNTER — Encounter: Payer: Self-pay | Admitting: Adult Health

## 2011-11-03 ENCOUNTER — Ambulatory Visit (INDEPENDENT_AMBULATORY_CARE_PROVIDER_SITE_OTHER): Payer: Medicare Other | Admitting: Adult Health

## 2011-11-03 VITALS — BP 122/72 | HR 88 | Resp 18 | Ht 67.0 in | Wt 184.0 lb

## 2011-11-03 DIAGNOSIS — I350 Nonrheumatic aortic (valve) stenosis: Secondary | ICD-10-CM

## 2011-11-03 DIAGNOSIS — I359 Nonrheumatic aortic valve disorder, unspecified: Secondary | ICD-10-CM

## 2011-11-03 DIAGNOSIS — I10 Essential (primary) hypertension: Secondary | ICD-10-CM

## 2011-11-03 NOTE — Assessment & Plan Note (Signed)
Although she is asymptomatic, will  follow-up with echo to evaluate further. Shortness of breath was associated with pneumonia, but will check on valve status for comparison to last echo in 2009.

## 2011-11-03 NOTE — Patient Instructions (Signed)
Your physician recommends that you schedule a follow-up appointment in: 6 months  Your physician has requested that you have an echocardiogram. Echocardiography is a painless test that uses sound waves to create images of your heart. It provides your doctor with information about the size and shape of your heart and how well your heart's chambers and valves are working. This procedure takes approximately one hour. There are no restrictions for this procedure.

## 2011-11-03 NOTE — Progress Notes (Signed)
HPI: [name] [name] is a 66 y/o patient of Dr. Moshe Cipro, we are following for ongoing assessment and treatment of aortic stenosis, hypertension, and hyperlipidemia.  She was recently hospitalized in March of 2013 for community acquired pneumonia. Continues on po antibiotics until completion. She has residual coughing but no significant shortness of breath. She is not dizzy or complaining of palpitations or chest discomfort. She is followed by Dr. Moshe Cipro for labs and is due to se her in 5 days.  No Known Allergies  Current Outpatient Prescriptions  Medication Sig Dispense Refill  . acetaminophen (TYLENOL) 325 MG tablet Take 2 tablets (650 mg total) by mouth every 6 (six) hours as needed (or Fever >/= 101).      Marland Kitchen albuterol (PROVENTIL HFA;VENTOLIN HFA) 108 (90 BASE) MCG/ACT inhaler Inhale 2 puffs into the lungs every 6 (six) hours as needed for wheezing.      Marland Kitchen amLODipine (NORVASC) 10 MG tablet Take 10 mg by mouth every morning. Take one tablet by mouth once a day discontinue azor      . aspirin 325 MG tablet Take 325 mg by mouth at bedtime. Take one tablet by mouth daily        . benazepril (LOTENSIN) 40 MG tablet Take 40 mg by mouth every morning. Take one tablet by mouth once a day discontinue azor      . glipiZIDE (GLIPIZIDE XL) 10 MG 24 hr tablet Take 20 mg by mouth every morning. Two tabs by mouth once a day      . levofloxacin (LEVAQUIN) 500 MG tablet Take 1 tablet (500 mg total) by mouth daily.  5 tablet  0  . metoprolol succinate (TOPROL-XL) 25 MG 24 hr tablet Take 37.5 mg by mouth 2 (two) times daily.       . Multiple Vitamin (MULITIVITAMIN WITH MINERALS) TABS Take 1 tablet by mouth every morning.      . rosuvastatin (CRESTOR) 40 MG tablet Take 40 mg by mouth at bedtime.       . sitaGLIPtan-metformin (JANUMET) 50-1000 MG per tablet Take 1 tablet by mouth 2 (two) times daily with meals. Take one tablet by mouth two times a day         Past Medical History  Diagnosis Date  .  Hypertension   . Hyperlipidemia   . Diabetes mellitus, type 2   . Anxiety   . Aortic stenosis 2009    Echocardiogram 2009-normal EF, mild AS; negative stress nuclear in 2004; negative carotid duplex study in 2009    Past Surgical History  Procedure Date  . Tubal ligation   . Cesarean section     [id number] of systems complete and found to be negative unless listed above PHYSICAL EXAM BP 122/72  Pulse 88  Resp 18  Ht 5\' 7"  (1.702 m)  Wt 184 lb (83.462 kg)  BMI 28.82 kg/m2  General: Well developed, well nourished, in no acute distress Head: Eyes PERRLA, No xanthomas.   Normal cephalic and atramatic  Lungs: Clear bilaterally to auscultation and percussion. Heart: HRRR S1 S2, 2/6 harsh systolic murmur. Radiation into the carotid arteries.  .  Pulses are 2+ & equal.            No carotid bruit. No JVD.  Radiation bruit to abdomin.   No femoral bruits. Abdomen: Bowel sounds are positive, abdomen soft and non-tender without masses or                  Hernia's noted. Msk:  Back normal, normal gait. Normal strength and tone for age. Extremities: No clubbing, cyanosis or edema.  DP +1 Neuro: Alert and oriented X 3. Psych:  Good affect, responds appropriately  EKG: NSR with LBBB. Left axis deviation rate of 86 bpm.  ASSESSMENT AND PLAN

## 2011-11-03 NOTE — Assessment & Plan Note (Signed)
Excellent control of blood pressure. No medication changes at this time. She is to have labs drawn per Dr. Moshe Cipro in 5 days, and therefore defer to PCP for follow-up on results.

## 2011-11-07 ENCOUNTER — Ambulatory Visit (HOSPITAL_COMMUNITY)
Admission: RE | Admit: 2011-11-07 | Discharge: 2011-11-07 | Disposition: A | Discharge status: Home / Self Care | Payer: Medicare Other | Source: Ambulatory Visit | Attending: Adult Health | Admitting: Adult Health

## 2011-11-07 ENCOUNTER — Ambulatory Visit: Payer: Medicare Other | Admitting: Family Medicine

## 2011-11-07 DIAGNOSIS — I359 Nonrheumatic aortic valve disorder, unspecified: Secondary | ICD-10-CM

## 2011-11-07 DIAGNOSIS — E785 Hyperlipidemia, unspecified: Secondary | ICD-10-CM | POA: Insufficient documentation

## 2011-11-07 DIAGNOSIS — E119 Type 2 diabetes mellitus without complications: Secondary | ICD-10-CM | POA: Insufficient documentation

## 2011-11-07 DIAGNOSIS — I10 Essential (primary) hypertension: Secondary | ICD-10-CM

## 2011-11-07 NOTE — Progress Notes (Signed)
*  PRELIMINARY RESULTS* Echocardiogram 2D Echocardiogram has been performed.  Tera Partridge 11/07/2011, 9:56 AM

## 2011-11-09 ENCOUNTER — Ambulatory Visit (INDEPENDENT_AMBULATORY_CARE_PROVIDER_SITE_OTHER): Payer: Medicare Other | Admitting: Family Medicine

## 2011-11-09 ENCOUNTER — Encounter: Payer: Self-pay | Admitting: Cardiology

## 2011-11-09 ENCOUNTER — Encounter: Payer: Self-pay | Admitting: Family Medicine

## 2011-11-09 VITALS — BP 120/70 | HR 78 | Resp 15 | Ht 65.25 in | Wt 186.0 lb

## 2011-11-09 DIAGNOSIS — I10 Essential (primary) hypertension: Secondary | ICD-10-CM | POA: Insufficient documentation

## 2011-11-09 DIAGNOSIS — E669 Obesity, unspecified: Secondary | ICD-10-CM

## 2011-11-09 DIAGNOSIS — I428 Other cardiomyopathies: Secondary | ICD-10-CM

## 2011-11-09 DIAGNOSIS — J189 Pneumonia, unspecified organism: Secondary | ICD-10-CM

## 2011-11-09 DIAGNOSIS — I350 Nonrheumatic aortic (valve) stenosis: Secondary | ICD-10-CM | POA: Insufficient documentation

## 2011-11-09 DIAGNOSIS — E785 Hyperlipidemia, unspecified: Secondary | ICD-10-CM

## 2011-11-09 DIAGNOSIS — D649 Anemia, unspecified: Secondary | ICD-10-CM

## 2011-11-09 DIAGNOSIS — E119 Type 2 diabetes mellitus without complications: Secondary | ICD-10-CM

## 2011-11-09 MED ORDER — AMLODIPINE BESYLATE 10 MG PO TABS: 10.0000 mg | ORAL_TABLET | ORAL | Status: DC

## 2011-11-09 MED ORDER — BENAZEPRIL HCL 40 MG PO TABS: 40.0000 mg | ORAL_TABLET | ORAL | Status: DC

## 2011-11-09 NOTE — Assessment & Plan Note (Signed)
Controlled, no change in medication  

## 2011-11-09 NOTE — Progress Notes (Signed)
  Subjective:    Patient ID: ELHAM SEBASTIANI, female    DOB: August 06, 1946, 66 y.o.   MRN: [id number]  HPI Pt i n for f/u recent hospitalization for pneumonia.(March 15 to 16) Reports less coughand scant sputum, no current fever or chill. Generally feels better, but still somewhat weak. Symptoms had come on suddenly. Denies polyuria , polydipsia or blurred vision, states blood sugars are good when checked  Review of Systems See HPI Denies sinus pressure, nasal congestion, ear pain or sore throat. Denies chest pains, palpitations and leg swelling Denies abdominal pain, nausea, vomiting,diarrhea or constipation.   Denies dysuria, frequency, hesitancy or incontinence. Denies joint pain, swelling and limitation in mobility. Denies headaches, seizures, numbness, or tingling. Denies depression, anxiety or insomnia. Denies skin break down or rash.        Objective:   Physical Exam Patient alert and oriented and in no cardiopulmonary distress.  HEENT: No facial asymmetry, EOMI, no sinus tenderness,  oropharynx pink and moist.  Neck supple no adenopathy.  Chest: Clear to auscultation bilaterally.  CVS: S1, S2  murmur, no S3.  ABD: Soft non tender. Bowel sounds normal.  Ext: No edema  MS: Adequate ROM spine, shoulders, hips and knees.  Skin: Intact, no ulcerations or rash noted.  Psych: Good eye contact, normal affect. Memory intact not anxious or depressed appearing.  CNS: CN 2-12 intact, power, tone and sensation normal throughout.        Assessment & Plan:

## 2011-11-09 NOTE — Assessment & Plan Note (Addendum)
Improved markedly pt applauded on this

## 2011-11-09 NOTE — Assessment & Plan Note (Signed)
Hyperlipidemia:Low fat diet discussed and encouraged.  Fasting labs in June

## 2011-11-09 NOTE — Patient Instructions (Signed)
F/u mid July.  Fasting lipid, cmp and EGFR, HBA1C, microalb mid  July.  Repeat CXR on April 25 to f/u pneumonia, this is i,[portant. If xray is still abnormal you will be referred to a lung specialist  I am happy that you are feeling better.  Please keep appointment with cardiology.  Blood sugar and blood pressure are excellent, no changes in medication at thsi time  It is important that you exercise regularly at least 30 minutes 5 times a week. If you develop chest pain, have severe difficulty breathing, or feel very tired, stop exercising immediately and seek medical attention  A healthy diet is rich in fruit, vegetables and whole grains. Poultry fish, nuts and beans are a healthy choice for protein rather then red meat. A low sodium diet and drinking 64 ounces of water daily is generally recommended. Oils and sweet should be limited. Carbohydrates especially for those who are diabetic or overweight, should be limited to 30-45 gram per meal. It is important to eat on a regular schedule, at least 3 times daily. Snacks should be primarily fruits, vegetables or nuts.

## 2011-11-10 ENCOUNTER — Ambulatory Visit (INDEPENDENT_AMBULATORY_CARE_PROVIDER_SITE_OTHER): Payer: Medicare Other | Admitting: Cardiology

## 2011-11-10 ENCOUNTER — Encounter: Payer: Self-pay | Admitting: Cardiology

## 2011-11-10 ENCOUNTER — Encounter: Payer: Self-pay | Admitting: *Deleted

## 2011-11-10 VITALS — BP 122/72 | HR 76 | Ht 67.0 in | Wt 184.0 lb

## 2011-11-10 DIAGNOSIS — E785 Hyperlipidemia, unspecified: Secondary | ICD-10-CM

## 2011-11-10 DIAGNOSIS — I359 Nonrheumatic aortic valve disorder, unspecified: Secondary | ICD-10-CM

## 2011-11-10 DIAGNOSIS — I10 Essential (primary) hypertension: Secondary | ICD-10-CM

## 2011-11-10 DIAGNOSIS — I428 Other cardiomyopathies: Secondary | ICD-10-CM

## 2011-11-10 DIAGNOSIS — I350 Nonrheumatic aortic (valve) stenosis: Secondary | ICD-10-CM

## 2011-11-10 DIAGNOSIS — D649 Anemia, unspecified: Secondary | ICD-10-CM

## 2011-11-10 MED ORDER — CARVEDILOL 25 MG PO TABS: 25.0000 mg | ORAL_TABLET | Freq: Two times a day (BID) | ORAL | Status: DC

## 2011-11-10 NOTE — Assessment & Plan Note (Signed)
Anemia with iron studies consistent with iron deficiency.  Stool specimens for Hemoccult testing have been requested.  Referral to gastroenterology for further evaluation will probably be necessary.

## 2011-11-10 NOTE — Assessment & Plan Note (Signed)
Aortic stenosis remains mild and is clearly not the cause of left ventricular dysfunction.

## 2011-11-10 NOTE — Assessment & Plan Note (Signed)
Generally good control of hyperlipidemia, especially in the absence of known vascular disease.

## 2011-11-10 NOTE — Progress Notes (Signed)
Patient ID: [name] [name], female   DOB: Jun 12, 1946, 66 y.o.   MRN: [id number]  HPI: [name] [name] is seen for an early return visit after an echocardiogram demonstrated new left ventricular impairment.  Study was personally reviewed, and I agree with the report as presented.  Aortic stenosis is mild, but there is new LV systolic dysfunction with virtual akinesis of the anteroseptal segment and apex and severely depressed ejection fraction.  Fortunately, patient describes no symptoms of congestive heart failure.  Lifestyle is sedentary, but she notes no dyspnea, orthopnea nor PND.  She has not developed pedal edema nor has she gained any weight.  She has no history of chest discomfort and is not known to have vascular disease.  She has never undergone cardiac catheterization.  Prior to Admission medications   Medication Sig Start Date End Date Taking? Authorizing Provider  acetaminophen (TYLENOL) 325 MG tablet Take 2 tablets (650 mg total) by mouth every 6 (six) hours as needed (or Fever >/= 101). 10/29/11 10/28/12 Yes Delfina Redwood, MD  albuterol (PROVENTIL HFA;VENTOLIN HFA) 108 (90 BASE) MCG/ACT inhaler Inhale 2 puffs into the lungs every 6 (six) hours as needed for wheezing. 10/29/11 10/28/12 Yes Delfina Redwood, MD  amLODipine (NORVASC) 10 MG tablet Take 1 tablet (10 mg total) by mouth every morning. Take one tablet by mouth once a day discontinue azor 11/09/11  Yes Fayrene Helper, MD  aspirin 325 MG tablet Take 325 mg by mouth at bedtime. Take one tablet by mouth daily     Yes Historical Provider, MD  benazepril (LOTENSIN) 40 MG tablet Take 1 tablet (40 mg total) by mouth every morning. Take one tablet by mouth once a day discontinue azor 11/09/11  Yes Fayrene Helper, MD  glipiZIDE (GLIPIZIDE XL) 10 MG 24 hr tablet Take 20 mg by mouth every morning. Two tabs by mouth once a day   Yes Historical Provider, MD  Multiple Vitamin (MULITIVITAMIN WITH MINERALS) TABS Take 1 tablet by mouth every  morning.   Yes Historical Provider, MD  rosuvastatin (CRESTOR) 40 MG tablet Take 40 mg by mouth at bedtime.    Yes Historical Provider, MD  sitaGLIPtan-metformin (JANUMET) 50-1000 MG per tablet Take 1 tablet by mouth 2 (two) times daily with meals. Take one tablet by mouth two times a day    Yes Historical Provider, MD  carvedilol (COREG) 25 MG tablet Take 1 tablet (25 mg total) by mouth 2 (two) times daily with a meal. 11/10/11 11/09/12  Yehuda Savannah, MD   No Known Allergies    Past medical history, social history, and family history reviewed and updated.  Denies family history of cardiomyopathy or sudden death.  ROS: Denies palpitations, lightheadedness or syncope.  All other systems reviewed and are negative.  PHYSICAL EXAM: BP 122/72  Pulse 76  Ht 5\' 7"  (1.702 m)  Wt 83.462 kg (184 lb)  BMI 28.82 kg/m2  General-Well developed; no acute distress Body habitus-Mildly to moderately overweight Neck-No JVD; no carotid bruits Lungs-clear lung fields; resonant to percussion Cardiovascular-normal PMI; normal S1 and S2 Abdomen-normal bowel sounds; soft and non-tender without masses or organomegaly Musculoskeletal-No deformities, no cyanosis or clubbing Neurologic-Normal cranial nerves; symmetric strength and tone Skin-Warm, no significant lesions Extremities-distal pulses intact; no edema  ASSESSMENT AND PLAN:  [name] Ducking, MD 11/10/2011 5:00 PM

## 2011-11-10 NOTE — Assessment & Plan Note (Signed)
Blood pressure control has been excellent over the past 12 months.

## 2011-11-10 NOTE — Patient Instructions (Signed)
Your physician recommends that you schedule a follow-up appointment in: [place] physician recommends that you return for lab work in: NEXT WEEK  STOOLS X 3 FOR BLOOD AND RETURN TO THE OFFICE TO BE TESTED AS SOON AS POSSIBLE.  Your physician has requested that you have a lexiscan myoview. For further information please visit [email or web address]. Please follow instruction sheet, as given.  CALL FOR WEIGHT GAIN OF MORE THAN 5 POUNDS  Your physician has requested that you have an echocardiogram IN 3 MONTHS. Echocardiography is a painless test that uses sound waves to create images of your heart. It provides your doctor with information about the size and shape of your heart and how well your heart's chambers and valves are working. This procedure takes approximately one hour. There are no restrictions for this procedure.  Your physician has recommended you make the following change in your medication:  1 - STOP TOPROL 2 - START COREG 25 MG TWICE A DAY

## 2011-11-10 NOTE — Assessment & Plan Note (Addendum)
Patient history does not suggest a cause for cardiomyopathy.  There is no family history for congestive heart failure, sudden death or hemochromatosis.  The patient has no personal history of alcohol abuse and no likelihood of having contracted human immunodeficiency virus.  TSH and BNP level will be measured.  Congestive heart failure may be compensated as a result of the patient's medications for hypertension, which include a beta blocker and ACE inhibitor.  Carvedilol will be substituted for Toprol, as the former drug is slightly more desirable in cardiomyopathy patients.  A pharmacologic stress test will be performed in the setting of chronic left bundle branch block to further evaluate for possible ischemic heart disease.  She will likely require an AICD, which in turn will likely be preceded by cardiac catheterization and coronary angiography.  These decisions will be deferred pending a repeat echocardiogram in 3 months.

## 2011-11-13 NOTE — Assessment & Plan Note (Signed)
Deterioration noted, pt has f/u with cardiology

## 2011-11-13 NOTE — Assessment & Plan Note (Signed)
Unchanged. Patient re-educated about  the importance of commitment to a  minimum of 150 minutes of exercise per week. The importance of healthy food choices with portion control discussed. Encouraged to start a food diary, count calories and to consider  joining a support group. Sample diet sheets offered. Goals set by the patient for the next several months.    

## 2011-11-13 NOTE — Assessment & Plan Note (Signed)
Controlled, no change in medication  

## 2011-11-14 ENCOUNTER — Telehealth: Payer: Self-pay | Admitting: Family Medicine

## 2011-11-14 MED ORDER — ALBUTEROL SULFATE HFA 108 (90 BASE) MCG/ACT IN AERS: 2.0000 | INHALATION_SPRAY | Freq: Four times a day (QID) | RESPIRATORY_TRACT | Status: DC | PRN

## 2011-11-14 NOTE — Telephone Encounter (Signed)
Refill sent.

## 2011-11-17 ENCOUNTER — Encounter (HOSPITAL_COMMUNITY): Admission: RE | Admit: 2011-11-17 | Payer: Medicare Other | Source: Ambulatory Visit

## 2011-11-17 ENCOUNTER — Encounter (HOSPITAL_COMMUNITY): Payer: Self-pay | Admitting: Cardiology

## 2011-11-17 ENCOUNTER — Ambulatory Visit (HOSPITAL_COMMUNITY)
Admission: RE | Admit: 2011-11-17 | Discharge: 2011-11-17 | Disposition: A | Discharge status: Home / Self Care | Payer: Medicare Other | Source: Ambulatory Visit | Attending: Cardiology | Admitting: Cardiology

## 2011-11-17 ENCOUNTER — Ambulatory Visit (INDEPENDENT_AMBULATORY_CARE_PROVIDER_SITE_OTHER): Payer: Medicare Other

## 2011-11-17 ENCOUNTER — Encounter (HOSPITAL_COMMUNITY): Payer: Self-pay

## 2011-11-17 ENCOUNTER — Encounter (HOSPITAL_COMMUNITY)
Admission: RE | Admit: 2011-11-17 | Discharge: 2011-11-17 | Disposition: A | Discharge status: Home / Self Care | Payer: Medicare Other | Source: Ambulatory Visit | Attending: Cardiology | Admitting: Cardiology

## 2011-11-17 DIAGNOSIS — I428 Other cardiomyopathies: Secondary | ICD-10-CM

## 2011-11-17 DIAGNOSIS — E785 Hyperlipidemia, unspecified: Secondary | ICD-10-CM | POA: Insufficient documentation

## 2011-11-17 DIAGNOSIS — E119 Type 2 diabetes mellitus without complications: Secondary | ICD-10-CM | POA: Insufficient documentation

## 2011-11-17 MED ORDER — TECHNETIUM TC 99M TETROFOSMIN IV KIT
30.0000 | PACK | Freq: Once | INTRAVENOUS | Status: AC | PRN
  Administered 2011-11-17: 28.5 via INTRAVENOUS

## 2011-11-17 MED ORDER — TECHNETIUM TC 99M TETROFOSMIN IV KIT
10.0000 | PACK | Freq: Once | INTRAVENOUS | Status: AC | PRN
  Administered 2011-11-17: 9.2 via INTRAVENOUS

## 2011-11-17 NOTE — Progress Notes (Signed)
Stress Lab Nurses Notes - [place] 11/17/2011  Reason for doing test: cardiomyopathy Type of test: Leane Call Nurse performing test: Gerrit Halls, RN Nuclear Medicine Tech: Melburn Hake Echo Tech: Not Applicable MD performing test: R. Rothbart Family MD: Moshe Cipro Test explained and consent signed: yes IV started: 22g jelco, Saline lock flushed, No redness or edema and Saline lock started in radiology Symptoms: SOB Treatment/Intervention: None Reason test stopped: protocol completed After recovery IV was: Discontinued via X-ray tech and No redness or edema Patient to return to [place]. Med at : 13:15 Patient discharged: Home Patient's Condition upon discharge was: stable Comments: During test BP 110/58 & HR 97.  Recovery BP 118/62 & HR 88. Symptoms resolved in recovery. Geanie Cooley T

## 2011-11-18 ENCOUNTER — Encounter: Payer: Self-pay | Admitting: *Deleted

## 2011-11-25 ENCOUNTER — Other Ambulatory Visit: Payer: Self-pay | Admitting: Cardiology

## 2011-11-26 LAB — TSH: TSH: 1.97 u[IU]/mL (ref 0.350–4.500)

## 2011-11-30 ENCOUNTER — Encounter (HOSPITAL_COMMUNITY): Payer: Self-pay | Admitting: Emergency Medicine

## 2011-11-30 ENCOUNTER — Emergency Department (HOSPITAL_COMMUNITY): Payer: Medicare Other

## 2011-11-30 ENCOUNTER — Inpatient Hospital Stay (HOSPITAL_COMMUNITY)
Admission: EM | Admit: 2011-11-30 | Discharge: 2011-12-07 | DRG: 286 | Disposition: A | Discharge status: Home Health | Payer: Medicare Other | Attending: Internal Medicine | Admitting: Internal Medicine

## 2011-11-30 DIAGNOSIS — E876 Hypokalemia: Secondary | ICD-10-CM | POA: Diagnosis not present

## 2011-11-30 DIAGNOSIS — R5381 Other malaise: Secondary | ICD-10-CM | POA: Diagnosis present

## 2011-11-30 DIAGNOSIS — E669 Obesity, unspecified: Secondary | ICD-10-CM | POA: Diagnosis present

## 2011-11-30 DIAGNOSIS — M1710 Unilateral primary osteoarthritis, unspecified knee: Secondary | ICD-10-CM | POA: Diagnosis present

## 2011-11-30 DIAGNOSIS — E663 Overweight: Secondary | ICD-10-CM | POA: Diagnosis present

## 2011-11-30 DIAGNOSIS — I350 Nonrheumatic aortic (valve) stenosis: Secondary | ICD-10-CM

## 2011-11-30 DIAGNOSIS — J449 Chronic obstructive pulmonary disease, unspecified: Secondary | ICD-10-CM | POA: Diagnosis present

## 2011-11-30 DIAGNOSIS — E1121 Type 2 diabetes mellitus with diabetic nephropathy: Secondary | ICD-10-CM | POA: Diagnosis present

## 2011-11-30 DIAGNOSIS — F411 Generalized anxiety disorder: Secondary | ICD-10-CM | POA: Diagnosis present

## 2011-11-30 DIAGNOSIS — I5023 Acute on chronic systolic (congestive) heart failure: Principal | ICD-10-CM | POA: Diagnosis present

## 2011-11-30 DIAGNOSIS — Z6827 Body mass index (BMI) 27.0-27.9, adult: Secondary | ICD-10-CM

## 2011-11-30 DIAGNOSIS — Z7982 Long term (current) use of aspirin: Secondary | ICD-10-CM

## 2011-11-30 DIAGNOSIS — Z794 Long term (current) use of insulin: Secondary | ICD-10-CM

## 2011-11-30 DIAGNOSIS — J9600 Acute respiratory failure, unspecified whether with hypoxia or hypercapnia: Secondary | ICD-10-CM

## 2011-11-30 DIAGNOSIS — I359 Nonrheumatic aortic valve disorder, unspecified: Secondary | ICD-10-CM | POA: Diagnosis present

## 2011-11-30 DIAGNOSIS — Z880 Allergy status to penicillin: Secondary | ICD-10-CM

## 2011-11-30 DIAGNOSIS — I509 Heart failure, unspecified: Secondary | ICD-10-CM | POA: Diagnosis present

## 2011-11-30 DIAGNOSIS — J4489 Other specified chronic obstructive pulmonary disease: Secondary | ICD-10-CM | POA: Diagnosis present

## 2011-11-30 DIAGNOSIS — I428 Other cardiomyopathies: Secondary | ICD-10-CM | POA: Diagnosis present

## 2011-11-30 DIAGNOSIS — I10 Essential (primary) hypertension: Secondary | ICD-10-CM

## 2011-11-30 DIAGNOSIS — Z79899 Other long term (current) drug therapy: Secondary | ICD-10-CM

## 2011-11-30 DIAGNOSIS — J189 Pneumonia, unspecified organism: Secondary | ICD-10-CM | POA: Diagnosis present

## 2011-11-30 DIAGNOSIS — D649 Anemia, unspecified: Secondary | ICD-10-CM | POA: Diagnosis present

## 2011-11-30 DIAGNOSIS — Z6826 Body mass index (BMI) 26.0-26.9, adult: Secondary | ICD-10-CM | POA: Diagnosis present

## 2011-11-30 DIAGNOSIS — E119 Type 2 diabetes mellitus without complications: Secondary | ICD-10-CM

## 2011-11-30 DIAGNOSIS — R0603 Acute respiratory distress: Secondary | ICD-10-CM

## 2011-11-30 DIAGNOSIS — I429 Cardiomyopathy, unspecified: Secondary | ICD-10-CM | POA: Diagnosis present

## 2011-11-30 DIAGNOSIS — E785 Hyperlipidemia, unspecified: Secondary | ICD-10-CM

## 2011-11-30 DIAGNOSIS — E66811 Obesity, class 1: Secondary | ICD-10-CM | POA: Diagnosis present

## 2011-11-30 DIAGNOSIS — J9690 Respiratory failure, unspecified, unspecified whether with hypoxia or hypercapnia: Secondary | ICD-10-CM

## 2011-11-30 HISTORY — DX: Unspecified osteoarthritis, unspecified site: M19.90

## 2011-11-30 LAB — DIFFERENTIAL
Basophils Absolute: 0.1 10*3/uL (ref 0.0–0.1)
Basophils Relative: 1 % (ref 0–1)
Eosinophils Absolute: 0.2 10*3/uL (ref 0.0–0.7)
Eosinophils Relative: 2 % (ref 0–5)
Lymphocytes Relative: 29 % (ref 12–46)
Lymphs Abs: 3.3 10*3/uL (ref 0.7–4.0)
Monocytes Absolute: 0.7 10*3/uL (ref 0.1–1.0)
Monocytes Relative: 6 % (ref 3–12)
Neutro Abs: 7.3 10*3/uL (ref 1.7–7.7)
Neutrophils Relative %: 64 % (ref 43–77)

## 2011-11-30 LAB — CBC
HCT: 33.5 % — ABNORMAL LOW (ref 36.0–46.0)
Hemoglobin: 10.6 g/dL — ABNORMAL LOW (ref 12.0–15.0)
MCH: 24.4 pg — ABNORMAL LOW (ref 26.0–34.0)
MCHC: 31.6 g/dL (ref 30.0–36.0)
MCV: 77 fL — ABNORMAL LOW (ref 78.0–100.0)
Platelets: 313 10*3/uL (ref 150–400)
RBC: 4.35 MIL/uL (ref 3.87–5.11)
RDW: 17.7 % — ABNORMAL HIGH (ref 11.5–15.5)
WBC: 11.5 10*3/uL — ABNORMAL HIGH (ref 4.0–10.5)

## 2011-11-30 LAB — CARDIAC PANEL(CRET KIN+CKTOT+MB+TROPI)
CK, MB: 2 ng/mL (ref 0.3–4.0)
Relative Index: 1 (ref 0.0–2.5)
Total CK: 195 U/L — ABNORMAL HIGH (ref 7–177)
Troponin I: <0.3 ng/mL (ref <0.30)

## 2011-11-30 LAB — BASIC METABOLIC PANEL
BUN: 17 mg/dL (ref 6–23)
CO2: 25 mEq/L (ref 19–32)
Calcium: 9.3 mg/dL (ref 8.4–10.5)
Chloride: 104 mEq/L (ref 96–112)
Creatinine, Ser: 0.87 mg/dL (ref 0.50–1.10)
GFR calc Af Amer: 79 mL/min — ABNORMAL LOW (ref >90)
GFR calc non Af Amer: 68 mL/min — ABNORMAL LOW (ref >90)
Glucose, Bld: 307 mg/dL — ABNORMAL HIGH (ref 70–99)
Potassium: 3.8 mEq/L (ref 3.5–5.1)
Sodium: 138 mEq/L (ref 135–145)

## 2011-11-30 LAB — PRO B NATRIURETIC PEPTIDE: Pro B Natriuretic peptide (BNP): 703 pg/mL — ABNORMAL HIGH (ref 0–125)

## 2011-11-30 LAB — PROTIME-INR
INR: 1.08 (ref 0.00–1.49)
Prothrombin Time: 14.2 seconds (ref 11.6–15.2)

## 2011-11-30 MED ORDER — SUCCINYLCHOLINE CHLORIDE 20 MG/ML IJ SOLN
50.0000 mg | Freq: Once | INTRAMUSCULAR | Status: AC
  Administered 2011-11-30: 50 mg via INTRAVENOUS

## 2011-11-30 MED ORDER — ETOMIDATE 2 MG/ML IV SOLN
INTRAVENOUS | Status: AC
  Administered 2011-11-30: 20 mg via INTRAVENOUS
  Filled 2011-11-30: qty 20

## 2011-11-30 MED ORDER — VECURONIUM BROMIDE 10 MG IV SOLR
7.0000 mg | Freq: Once | INTRAVENOUS | Status: AC
  Administered 2011-11-30: 7 mg via INTRAVENOUS

## 2011-11-30 MED ORDER — ROCURONIUM BROMIDE 50 MG/5ML IV SOLN
INTRAVENOUS | Status: AC
  Filled 2011-11-30: qty 2

## 2011-11-30 MED ORDER — LIDOCAINE HCL (CARDIAC) 20 MG/ML IV SOLN
INTRAVENOUS | Status: AC
  Filled 2011-11-30: qty 5

## 2011-11-30 MED ORDER — METHYLPREDNISOLONE SODIUM SUCC 125 MG IJ SOLR
125.0000 mg | Freq: Once | INTRAMUSCULAR | Status: AC
  Administered 2011-11-30: 125 mg via INTRAVENOUS

## 2011-11-30 MED ORDER — METHYLPREDNISOLONE SODIUM SUCC 125 MG IJ SOLR
INTRAMUSCULAR | Status: AC
  Filled 2011-11-30: qty 2

## 2011-11-30 MED ORDER — PROPOFOL 10 MG/ML IV EMUL
5.0000 ug/kg/min | INTRAVENOUS | Status: DC
  Administered 2011-12-01 (×2): 35 ug/kg/min via INTRAVENOUS
  Administered 2011-12-01: 20 ug/kg/min via INTRAVENOUS
  Administered 2011-12-01: 10 ug/kg/min via INTRAVENOUS
  Administered 2011-12-02: 35 ug/kg/min via INTRAVENOUS
  Filled 2011-11-30 (×6): qty 100

## 2011-11-30 MED ORDER — SUCCINYLCHOLINE CHLORIDE 20 MG/ML IJ SOLN
INTRAMUSCULAR | Status: AC
  Administered 2011-11-30: 125 mg
  Filled 2011-11-30: qty 1

## 2011-11-30 MED ORDER — VECURONIUM BROMIDE 10 MG IV SOLR
INTRAVENOUS | Status: AC
  Filled 2011-11-30: qty 10

## 2011-11-30 MED ORDER — PROPOFOL 10 MG/ML IV EMUL
5.0000 ug/kg/min | INTRAVENOUS | Status: DC
  Administered 2011-12-01: 10 ug/kg/min via INTRAVENOUS

## 2011-11-30 NOTE — ED Notes (Signed)
73mm trach tube at 22cm at lip. Positive color change.

## 2011-11-30 NOTE — ED Provider Notes (Signed)
History   This chart was scribed for Nat Christen, MD by Kathreen Cornfield. The patient was seen in room APA02/APA02 and the patient's care was started at 10:25PM    CSN: [id number]  Arrival date & time 11/30/11  2223   None    LEVEL 5 CAVEAT: [place].   Chief Complaint  Patient presents with  . Respiratory Distress    (Consider location/radiation/quality/duration/timing/severity/associated sxs/prior treatment) HPI  [name] [name] is a 66 y.o. female who presents to the Emergency Department complaining of severe respiratory distress with associated symptoms of shortness of breath. Pt has a hx of visiting [facility] in March 2013 where which she was hospitalized and diagnosed with pneumonia.  Patient became short of breath approximately 30 minutes prior to arrival of EMS.  BiPAP was applied. Patient was an extremist by the time she arrived in ED. Level V caveat for urgent need for intervention PCP is Dr. Moshe Cipro.     Past Medical History  Diagnosis Date  . 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 Hyperlipidemia   . Diabetes mellitus, type 2   . Anxiety   . Aortic stenosis 2009    Echocardiogram 2009-normal EF, mild AS; negative stress nuclear in 2004; negative carotid duplex study in 2009; echo 10/2011-mild LVH, AS-IS-apical akinesis with EF of 20-25%,  mild to mod. AS  . Pneumonia 10/2011    Hospitalized  . Palpitations     freq. PVCs  . Cardiomyopathy 10/2011    EF of 20-25% on echo in 10/2011    Past Surgical History  Procedure Date  . Tubal ligation   . Cesarean section     Family History  Problem Relation Age of Onset  . Arthritis Other     Family History  . Heart disease Mother   . Heart attack Mother   . Cancer Father     colon  . Cancer Sister     breast  . Diabetes Brother   . Hypertension Brother   . Stroke Brother     History  Substance Use Topics  . Smoking status: Never Smoker   . Smokeless tobacco:  Never Used  . Alcohol Use: No    OB History    Grav Para Term Preterm Abortions TAB SAB Ect Mult Living                  Review of Systems  Unable to perform ROS: Other  All other systems reviewed and are negative.    10 Systems reviewed and all are negative for acute change except as noted in the HPI.    Allergies  Review of patient's allergies indicates no known allergies.  Home Medications   Current Outpatient Rx  Name Route Sig Dispense Refill  . ACETAMINOPHEN 325 MG PO TABS Oral Take 2 tablets (650 mg total) by mouth every 6 (six) hours as needed (or Fever >/= 101).    . ALBUTEROL SULFATE HFA 108 (90 BASE) MCG/ACT IN AERS Inhalation Inhale 2 puffs into the lungs every 6 (six) hours as needed for wheezing. 1 Inhaler 0  . AMLODIPINE BESYLATE 10 MG PO TABS Oral Take 1 tablet (10 mg total) by mouth every morning. Take one tablet by mouth once a day discontinue azor 30 tablet 3  . ASPIRIN 325 MG PO TABS Oral Take 325 mg by mouth at bedtime. Take one tablet by mouth daily      . BENAZEPRIL HCL 40  MG PO TABS Oral Take 1 tablet (40 mg total) by mouth every morning. Take one tablet by mouth once a day discontinue azor 30 tablet 4  . CARVEDILOL 25 MG PO TABS Oral Take 1 tablet (25 mg total) by mouth 2 (two) times daily with a meal. 60 tablet 12  . GLIPIZIDE ER 10 MG PO TB24 Oral Take 20 mg by mouth every morning. Two tabs by mouth once a day    . ADULT MULTIVITAMIN W/MINERALS CH Oral Take 1 tablet by mouth every morning.    Marland Kitchen ROSUVASTATIN CALCIUM 40 MG PO TABS Oral Take 40 mg by mouth at bedtime.     Marland Kitchen SITAGLIPTIN-METFORMIN HCL 50-1000 MG PO TABS Oral Take 1 tablet by mouth 2 (two) times daily with meals. Take one tablet by mouth two times a day       BP 133/69  Pulse 84  Temp(Src) 97.3 F (36.3 C) (Rectal)  Resp 29  Wt 200 lb (90.719 kg)  SpO2 89%  Physical Exam  Nursing note and vitals reviewed. Constitutional:       Severe respiratory distress.  Patient oxygenates  well with Ambu bag  HENT:  Head: Normocephalic and atraumatic.  Eyes: Conjunctivae are normal. Pupils are equal, round, and reactive to light.  Neck: Neck supple.  Cardiovascular: Regular rhythm.        tachycardic  Pulmonary/Chest:       Moving air poorly  Abdominal: Soft. Bowel sounds are normal.  Musculoskeletal:       unable  Neurological:       obtunded  Skin:       warm  Psychiatric:       unable    ED Course  Procedures (including critical care time)  Labs Reviewed - No data to display No results found.   No diagnosis found.  10:25PM- EDP at bedside.  10:26PM- EMS reports initial respiratory rate of 30/min.   10:27PM- EDP orders 20 atomodate, 125 succinyl choline.   10:31PM- EDP at bedside begins intubation procedure.   10:38PM- EDP requests glidescope  10:42PM- EDP orders 50 succinyl choline.   10:43PM- EDP at bedside requests more ambu bag   10:49PM- EDP requests a smaller endotracheal tube. (exchange 7.5 for 7.0]  10:50PM- EDP orders 125 solumedrol IV.  10:53PM- EDP orders 50 succinyl choline.   10:54PM- EDP begins intubation procedure with smaller size tube.   10:59PM- EDP begins intubation procedure with boogie   11:00PM- EDP orders 125 solumedrol.    Date: 11/30/2011  Rate: 89  Rhythm: normal sinus rhythm  QRS Axis: left  Intervals: normal  ST/T Wave abnormalities: normal  Conduction Disutrbances:left bundle branch block  Narrative Interpretation:   Old EKG Reviewed: changes noted  CRITICAL CARE Performed by: Nat Christen  ?  Total critical care time: 60  Critical care time was exclusive of separately billable procedures and treating other patients.  Critical care was necessary to treat or prevent imminent or life-threatening deterioration.  Critical care was time spent personally by me on the following activities: development of treatment plan with patient and/or surrogate as well as nursing, discussions with consultants,  evaluation of patient's response to treatment, examination of patient, obtaining history from patient or surrogate, ordering and performing treatments and interventions, ordering and review of laboratory studies, ordering and review of radiographic studies, pulse oximetry and re-evaluation of patient's condition. MDM     Patient was intubated with various maneuvers including traditional laryngoscope, glidescope, boogie.  Good oxygenation was maintained while patient  was on Ambu bag. Rapid sequence intubation technique used with succinylcholine and etomidate.  Post procedure chest x-ray shows good tube placement.  Patient admitted to hospital  Nat Christen, MD 12/01/11 [phone]

## 2011-11-30 NOTE — ED Notes (Signed)
Patient presents to ER via EMS, states was recently discharged from [facility] for pneumonia.  Family told EMS that approximately 30 minutes prior to arrival that patient became short of breath and began to deteriorate.

## 2011-12-01 ENCOUNTER — Encounter (HOSPITAL_COMMUNITY): Payer: Self-pay | Admitting: Internal Medicine

## 2011-12-01 DIAGNOSIS — I359 Nonrheumatic aortic valve disorder, unspecified: Secondary | ICD-10-CM

## 2011-12-01 DIAGNOSIS — J9600 Acute respiratory failure, unspecified whether with hypoxia or hypercapnia: Secondary | ICD-10-CM

## 2011-12-01 DIAGNOSIS — I428 Other cardiomyopathies: Secondary | ICD-10-CM

## 2011-12-01 DIAGNOSIS — R0603 Acute respiratory distress: Secondary | ICD-10-CM | POA: Diagnosis present

## 2011-12-01 LAB — BASIC METABOLIC PANEL
BUN: 18 mg/dL (ref 6–23)
CO2: 24 mEq/L (ref 19–32)
Calcium: 9.4 mg/dL (ref 8.4–10.5)
Chloride: 102 mEq/L (ref 96–112)
Creatinine, Ser: 0.75 mg/dL (ref 0.50–1.10)
GFR calc Af Amer: >90 mL/min (ref >90)
GFR calc non Af Amer: 86 mL/min — ABNORMAL LOW (ref >90)
Glucose, Bld: 178 mg/dL — ABNORMAL HIGH (ref 70–99)
Potassium: 2.9 mEq/L — ABNORMAL LOW (ref 3.5–5.1)
Sodium: 140 mEq/L (ref 135–145)

## 2011-12-01 LAB — URINE MICROSCOPIC-ADD ON: Urine-Other: NONE SEEN

## 2011-12-01 LAB — CBC
HCT: 34.8 % — ABNORMAL LOW (ref 36.0–46.0)
Hemoglobin: 11 g/dL — ABNORMAL LOW (ref 12.0–15.0)
MCH: 23.9 pg — ABNORMAL LOW (ref 26.0–34.0)
MCHC: 31.6 g/dL (ref 30.0–36.0)
MCV: 75.7 fL — ABNORMAL LOW (ref 78.0–100.0)
Platelets: 296 10*3/uL (ref 150–400)
RBC: 4.6 MIL/uL (ref 3.87–5.11)
RDW: 17.5 % — ABNORMAL HIGH (ref 11.5–15.5)
WBC: 9.7 10*3/uL (ref 4.0–10.5)

## 2011-12-01 LAB — BLOOD GAS, ARTERIAL
Acid-Base Excess: 1.4 mmol/L (ref 0.0–2.0)
Acid-base deficit: 3 mmol/L — ABNORMAL HIGH (ref 0.0–2.0)
Bicarbonate: 22.6 mEq/L (ref 20.0–24.0)
Bicarbonate: 25.3 mEq/L — ABNORMAL HIGH (ref 20.0–24.0)
Drawn by: 21310
Drawn by: 22223
FIO2: 100 %
FIO2: 65 %
MECHVT: 500 mL
MECHVT: 5003 mL
O2 Saturation: 97.8 %
O2 Saturation: 97.9 %
PEEP: 5 cmH2O
PEEP: 5 cmH2O
Patient temperature: 37
Patient temperature: 37
RATE: 14 resp/min
RATE: 16 resp/min
TCO2: 21.2 mmol/L (ref 0–100)
TCO2: 23 mmol/L (ref 0–100)
pCO2 arterial: 38.9 mmHg (ref 35.0–45.0)
pCO2 arterial: 48.4 mmHg — ABNORMAL HIGH (ref 35.0–45.0)
pH, Arterial: 7.291 — ABNORMAL LOW (ref 7.350–7.400)
pH, Arterial: 7.429 — ABNORMAL HIGH (ref 7.350–7.400)
pO2, Arterial: 111 mmHg — ABNORMAL HIGH (ref 80.0–100.0)
pO2, Arterial: 125 mmHg — ABNORMAL HIGH (ref 80.0–100.0)

## 2011-12-01 LAB — URINE CULTURE
Colony Count: NO GROWTH
Culture  Setup Time: 201304180145
Culture: NO GROWTH

## 2011-12-01 LAB — CREATININE, SERUM
Creatinine, Ser: 0.76 mg/dL (ref 0.50–1.10)
GFR calc Af Amer: >90 mL/min (ref >90)
GFR calc non Af Amer: 86 mL/min — ABNORMAL LOW (ref >90)

## 2011-12-01 LAB — GLUCOSE, CAPILLARY
Glucose-Capillary: 183 mg/dL — ABNORMAL HIGH (ref 70–99)
Glucose-Capillary: 122 mg/dL — ABNORMAL HIGH (ref 70–99)
Glucose-Capillary: 189 mg/dL — ABNORMAL HIGH (ref 70–99)
Glucose-Capillary: 231 mg/dL — ABNORMAL HIGH (ref 70–99)
Glucose-Capillary: 197 mg/dL — ABNORMAL HIGH (ref 70–99)

## 2011-12-01 LAB — URINALYSIS, ROUTINE W REFLEX MICROSCOPIC
Bilirubin Urine: NEGATIVE
Glucose, UA: NEGATIVE mg/dL
Ketones, ur: NEGATIVE mg/dL
Leukocytes, UA: NEGATIVE
Nitrite: NEGATIVE
Protein, ur: NEGATIVE mg/dL
Specific Gravity, Urine: 1.01 (ref 1.005–1.030)
Urobilinogen, UA: 0.2 mg/dL (ref 0.0–1.0)
pH: 5.5 (ref 5.0–8.0)

## 2011-12-01 LAB — HEMOGLOBIN A1C
Hgb A1c MFr Bld: 6.5 % — ABNORMAL HIGH (ref <5.7)
Mean Plasma Glucose: 140 mg/dL — ABNORMAL HIGH (ref <117)

## 2011-12-01 LAB — MAGNESIUM: Magnesium: 1.7 mg/dL (ref 1.5–2.5)

## 2011-12-01 LAB — URINALYSIS, MICROSCOPIC ONLY
Bilirubin Urine: NEGATIVE
Glucose, UA: >1000 mg/dL — AB
Hgb urine dipstick: NEGATIVE
Ketones, ur: NEGATIVE mg/dL
Leukocytes, UA: NEGATIVE
Nitrite: NEGATIVE
Protein, ur: NEGATIVE mg/dL
Specific Gravity, Urine: 1.015 (ref 1.005–1.030)
Urobilinogen, UA: 0.2 mg/dL (ref 0.0–1.0)
pH: 5.5 (ref 5.0–8.0)

## 2011-12-01 LAB — PRO B NATRIURETIC PEPTIDE: Pro B Natriuretic peptide (BNP): 1086 pg/mL — ABNORMAL HIGH (ref 0–125)

## 2011-12-01 LAB — INFLUENZA PANEL BY PCR (TYPE A & B)
H1N1 flu by pcr: NOT DETECTED
Influenza A By PCR: NEGATIVE
Influenza B By PCR: NEGATIVE

## 2011-12-01 LAB — MRSA PCR SCREENING: MRSA by PCR: POSITIVE — AB

## 2011-12-01 LAB — STREP PNEUMONIAE URINARY ANTIGEN: Strep Pneumo Urinary Antigen: NEGATIVE

## 2011-12-01 MED ORDER — PHENOL 1.4 % MT LIQD
1.0000 | OROMUCOSAL | Status: DC | PRN
  Filled 2011-12-01 (×2): qty 177

## 2011-12-01 MED ORDER — DEXTROSE 5 % IV SOLN
1.0000 g | Freq: Once | INTRAVENOUS | Status: DC
  Filled 2011-12-01: qty 10

## 2011-12-01 MED ORDER — ALBUTEROL SULFATE HFA 108 (90 BASE) MCG/ACT IN AERS
4.0000 | INHALATION_SPRAY | RESPIRATORY_TRACT | Status: DC
  Filled 2011-12-01: qty 6.7

## 2011-12-01 MED ORDER — DEXTROSE 5 % IV SOLN
1.0000 g | Freq: Once | INTRAVENOUS | Status: AC
  Administered 2011-12-01: 1 g via INTRAVENOUS
  Filled 2011-12-01: qty 1

## 2011-12-01 MED ORDER — FAMOTIDINE IN NACL 20-0.9 MG/50ML-% IV SOLN
20.0000 mg | Freq: Two times a day (BID) | INTRAVENOUS | Status: DC
Start: 2011-12-01 — End: 2011-12-05
  Administered 2011-12-01 – 2011-12-05 (×10): 20 mg via INTRAVENOUS
  Filled 2011-12-01 (×11): qty 50

## 2011-12-01 MED ORDER — FUROSEMIDE 10 MG/ML IJ SOLN
40.0000 mg | Freq: Two times a day (BID) | INTRAMUSCULAR | Status: DC
  Administered 2011-12-01 – 2011-12-05 (×9): 40 mg via INTRAVENOUS
  Filled 2011-12-01 (×10): qty 4

## 2011-12-01 MED ORDER — SPIRONOLACTONE 12.5 MG HALF TABLET
12.5000 mg | ORAL_TABLET | Freq: Every day | ORAL | Status: DC
  Administered 2011-12-01 – 2011-12-07 (×7): 12.5 mg via ORAL
  Filled 2011-12-01 (×5): qty 1
  Filled 2011-12-01: qty 2
  Filled 2011-12-01: qty 1

## 2011-12-01 MED ORDER — ASPIRIN 300 MG RE SUPP: 300.0000 mg | RECTAL | Status: AC

## 2011-12-01 MED ORDER — VANCOMYCIN HCL IN DEXTROSE 1-5 GM/200ML-% IV SOLN
1000.0000 mg | Freq: Once | INTRAVENOUS | Status: AC
  Administered 2011-12-01: 1000 mg via INTRAVENOUS
  Filled 2011-12-01: qty 200

## 2011-12-01 MED ORDER — LEVOFLOXACIN IN D5W 750 MG/150ML IV SOLN
750.0000 mg | INTRAVENOUS | Status: AC
  Administered 2011-12-01 – 2011-12-03 (×3): 750 mg via INTRAVENOUS
  Filled 2011-12-01 (×4): qty 150

## 2011-12-01 MED ORDER — MUPIROCIN 2 % EX OINT
1.0000 "application " | TOPICAL_OINTMENT | Freq: Two times a day (BID) | CUTANEOUS | Status: AC
  Administered 2011-12-01 – 2011-12-05 (×10): 1 via NASAL
  Filled 2011-12-01 (×4): qty 22

## 2011-12-01 MED ORDER — ENALAPRILAT 1.25 MG/ML IV SOLN
1.2500 mg | Freq: Four times a day (QID) | INTRAVENOUS | Status: DC
  Administered 2011-12-01 – 2011-12-02 (×4): 1.25 mg via INTRAVENOUS
  Filled 2011-12-01 (×4): qty 2

## 2011-12-01 MED ORDER — FAMOTIDINE IN NACL 20-0.9 MG/50ML-% IV SOLN
INTRAVENOUS | Status: AC
  Filled 2011-12-01: qty 50

## 2011-12-01 MED ORDER — METHYLPREDNISOLONE SODIUM SUCC 125 MG IJ SOLR
125.0000 mg | Freq: Four times a day (QID) | INTRAMUSCULAR | Status: DC
  Administered 2011-12-01 – 2011-12-02 (×5): 125 mg via INTRAVENOUS
  Filled 2011-12-01 (×6): qty 2

## 2011-12-01 MED ORDER — ENOXAPARIN SODIUM 40 MG/0.4ML ~~LOC~~ SOLN
40.0000 mg | SUBCUTANEOUS | Status: DC
  Administered 2011-12-02 – 2011-12-05 (×4): 40 mg via SUBCUTANEOUS
  Filled 2011-12-01 (×5): qty 0.4

## 2011-12-01 MED ORDER — CARVEDILOL 3.125 MG PO TABS
3.1250 mg | ORAL_TABLET | Freq: Two times a day (BID) | ORAL | Status: DC
  Administered 2011-12-01 – 2011-12-02 (×4): 3.125 mg via ORAL
  Filled 2011-12-01 (×4): qty 1

## 2011-12-01 MED ORDER — POTASSIUM CHLORIDE 10 MEQ/100ML IV SOLN
10.0000 meq | INTRAVENOUS | Status: DC
  Administered 2011-12-01: 10 meq via INTRAVENOUS
  Filled 2011-12-01: qty 200

## 2011-12-01 MED ORDER — FUROSEMIDE 10 MG/ML IJ SOLN
40.0000 mg | Freq: Once | INTRAMUSCULAR | Status: AC
  Administered 2011-12-01: 40 mg via INTRAVENOUS
  Filled 2011-12-01: qty 4

## 2011-12-01 MED ORDER — PANTOPRAZOLE SODIUM 40 MG IV SOLR: 40.0000 mg | Freq: Every day | INTRAVENOUS | Status: DC

## 2011-12-01 MED ORDER — IPRATROPIUM BROMIDE HFA 17 MCG/ACT IN AERS
4.0000 | INHALATION_SPRAY | RESPIRATORY_TRACT | Status: DC
  Filled 2011-12-01: qty 12.9

## 2011-12-01 MED ORDER — LEVOFLOXACIN IN D5W 500 MG/100ML IV SOLN
500.0000 mg | Freq: Once | INTRAVENOUS | Status: AC
  Administered 2011-12-01: 500 mg via INTRAVENOUS
  Filled 2011-12-01: qty 100

## 2011-12-01 MED ORDER — VANCOMYCIN HCL IN DEXTROSE 1-5 GM/200ML-% IV SOLN
1000.0000 mg | Freq: Two times a day (BID) | INTRAVENOUS | Status: DC
  Administered 2011-12-01 – 2011-12-03 (×4): 1000 mg via INTRAVENOUS
  Filled 2011-12-01 (×9): qty 200

## 2011-12-01 MED ORDER — ALBUTEROL SULFATE HFA 108 (90 BASE) MCG/ACT IN AERS: 4.0000 | INHALATION_SPRAY | RESPIRATORY_TRACT | Status: DC

## 2011-12-01 MED ORDER — PROPOFOL 10 MG/ML IV EMUL
INTRAVENOUS | Status: AC
  Administered 2011-12-01: 10 ug/kg/min via INTRAVENOUS
  Filled 2011-12-01: qty 100

## 2011-12-01 MED ORDER — SODIUM CHLORIDE 0.9 % IV SOLN
50.0000 ug/h | INTRAVENOUS | Status: DC
  Administered 2011-12-01: 50 ug/h via INTRAVENOUS
  Administered 2011-12-02: 75 ug/h via INTRAVENOUS
  Filled 2011-12-01 (×2): qty 50

## 2011-12-01 MED ORDER — FENTANYL BOLUS VIA INFUSION
50.0000 ug | Freq: Four times a day (QID) | INTRAVENOUS | Status: DC | PRN
  Filled 2011-12-01: qty 100

## 2011-12-01 MED ORDER — CHLORHEXIDINE GLUCONATE CLOTH 2 % EX PADS
6.0000 | MEDICATED_PAD | Freq: Every day | CUTANEOUS | Status: DC
  Administered 2011-12-01 – 2011-12-04 (×4): 6 via TOPICAL

## 2011-12-01 MED ORDER — POTASSIUM CHLORIDE 10 MEQ/100ML IV SOLN
10.0000 meq | INTRAVENOUS | Status: AC
  Administered 2011-12-01 (×6): 10 meq via INTRAVENOUS
  Filled 2011-12-01: qty 400

## 2011-12-01 MED ORDER — IPRATROPIUM-ALBUTEROL 18-103 MCG/ACT IN AERO
4.0000 | INHALATION_SPRAY | RESPIRATORY_TRACT | Status: DC
  Administered 2011-12-01 – 2011-12-02 (×10): 4 via RESPIRATORY_TRACT
  Filled 2011-12-01: qty 14.7

## 2011-12-01 MED ORDER — ENOXAPARIN SODIUM 40 MG/0.4ML ~~LOC~~ SOLN: 40.0000 mg | Freq: Every day | SUBCUTANEOUS | Status: DC

## 2011-12-01 MED ORDER — ENOXAPARIN SODIUM 30 MG/0.3ML ~~LOC~~ SOLN
30.0000 mg | SUBCUTANEOUS | Status: DC
  Administered 2011-12-01: 30 mg via SUBCUTANEOUS
  Filled 2011-12-01: qty 0.3

## 2011-12-01 MED ORDER — CHLORHEXIDINE GLUCONATE 0.12 % MT SOLN
15.0000 mL | Freq: Two times a day (BID) | OROMUCOSAL | Status: DC
  Administered 2011-12-01 – 2011-12-07 (×13): 15 mL via OROMUCOSAL
  Filled 2011-12-01 (×18): qty 15

## 2011-12-01 MED ORDER — BIOTENE DRY MOUTH MT LIQD
15.0000 mL | Freq: Four times a day (QID) | OROMUCOSAL | Status: DC
  Administered 2011-12-01 – 2011-12-06 (×17): 15 mL via OROMUCOSAL

## 2011-12-01 MED ORDER — DEXTROSE 5 % IV SOLN
2.0000 g | Freq: Three times a day (TID) | INTRAVENOUS | Status: DC
  Administered 2011-12-01 – 2011-12-03 (×7): 2 g via INTRAVENOUS
  Filled 2011-12-01 (×15): qty 2

## 2011-12-01 MED ORDER — INSULIN ASPART 100 UNIT/ML ~~LOC~~ SOLN
0.0000 [IU] | SUBCUTANEOUS | Status: DC
  Administered 2011-12-01 (×2): 2 [IU] via SUBCUTANEOUS
  Administered 2011-12-01: 1 [IU] via SUBCUTANEOUS
  Administered 2011-12-01: 3 [IU] via SUBCUTANEOUS
  Administered 2011-12-01: 2 [IU] via SUBCUTANEOUS
  Administered 2011-12-02 (×3): 5 [IU] via SUBCUTANEOUS
  Administered 2011-12-02: 7 [IU] via SUBCUTANEOUS
  Administered 2011-12-02: 2 [IU] via SUBCUTANEOUS
  Administered 2011-12-02: 5 [IU] via SUBCUTANEOUS

## 2011-12-01 MED ORDER — ASPIRIN 81 MG PO CHEW
324.0000 mg | CHEWABLE_TABLET | ORAL | Status: AC
  Administered 2011-12-01: 324 mg via ORAL
  Filled 2011-12-01: qty 1
  Filled 2011-12-01: qty 3

## 2011-12-01 MED ORDER — SODIUM CHLORIDE 0.9 % IV SOLN: 250.0000 mL | INTRAVENOUS | Status: DC | PRN

## 2011-12-01 MED ORDER — DEXTROSE 5 % IV SOLN: 500.0000 mg | Freq: Once | INTRAVENOUS | Status: DC

## 2011-12-01 NOTE — Progress Notes (Signed)
ANTIBIOTIC CONSULT NOTE - INITIAL  Pharmacy Consult for vancomycin, levofloxin, cefipime Indication: pneumonia, R/O sepsis  No Known Allergies  Patient Measurements: Weight: 200 lb (90.719 kg) Adjusted Body Weight: unknown ht  Vital Signs: Temp: 97.3 F (36.3 C) (04/17 2250) Temp src: Rectal (04/17 2250) BP: 133/69 mmHg (04/17 2250) Pulse Rate: 84  (04/17 2250) Intake/Output from previous day:   Intake/Output from this shift:    Labs:  Basename 11/30/11 2317  WBC 11.5*  HGB 10.6*  PLT 313  LABCREA --  CREATININE 0.87   The CrCl is unknown because both a height and weight (above a minimum accepted value) are required for this calculation.   Microbiology: No results found for this or any previous visit (from the past 720 hour(s)).  Medical History: Past Medical History  Diagnosis Date  . 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 Hyperlipidemia   . Diabetes mellitus, type 2   . Anxiety   . Aortic stenosis 2009    Echocardiogram 2009-normal EF, mild AS; negative stress nuclear in 2004; negative carotid duplex study in 2009; echo 10/2011-mild LVH, AS-IS-apical akinesis with EF of 20-25%,  mild to mod. AS  . Pneumonia 10/2011    Hospitalized  . Palpitations     freq. PVCs  . Cardiomyopathy 10/2011    EF of 20-25% on echo in 10/2011    Medications:     Rocephin 1gm IV x 1    Azithromycin 500mg  IV x 1    To add cefipime, levofloxin, & vancomycin  Assessment:    Incomplete data for ht, cultures, I/O's.  Will initiate with standard one time doses for coverage of pneumonia / sepsis.  Goal of Therapy:    One time initiation doses to be given tonight, with daytime clinical pharmacists to follow in morning.  Plan:  1.  Levofloxin 500mg  IV x 1 2.  Vancomycin 1gm IV x 1 3.  Cefipime 1gm IV x 1  Lafayette Dunlevy E 12/01/2011,12:55 AM

## 2011-12-01 NOTE — H&P (Signed)
PCP:   [name], MD, MD   Chief Complaint:  Acute shortness of breath since today  HPI: [name] [name] is an 66 y.o. female.  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  There is no history is no clear history of fever or cough, patient's chest x-ray is suggestive of pneumonia, and the hospitalist service was called to assist with management.  Rewiew of Systems:  Unable to obtain since patient is intubated sedated and paralyzed.   Past Medical History  Diagnosis Date  . 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 Hyperlipidemia   . Diabetes mellitus, type 2   . Anxiety   . Aortic stenosis 2009    Echocardiogram 2009-normal EF, mild AS; negative stress nuclear in 2004; negative carotid duplex study in 2009; echo 10/2011-mild LVH, AS-IS-apical akinesis with EF of 20-25%,  mild to mod. AS  . Pneumonia 10/2011    Hospitalized  . Palpitations     freq. PVCs  . Cardiomyopathy 10/2011    EF of 20-25% on echo in 10/2011    Past Surgical History  Procedure Date  . Tubal ligation   . Cesarean section     Medications:  HOME MEDS: Prior to Admission medications   Medication Sig Start Date End Date Taking? Authorizing Provider  acetaminophen (TYLENOL) 325 MG tablet Take 2 tablets (650 mg total) by mouth every 6 (six) hours as needed (or Fever >/= 101). 10/29/11 10/28/12  Delfina Redwood, MD  albuterol (PROVENTIL HFA;VENTOLIN HFA) 108 (90 BASE) MCG/ACT inhaler Inhale 2 puffs into the lungs every 6 (six) hours as needed for wheezing. 11/14/11 11/13/12  Fayrene Helper, MD  amLODipine (NORVASC) 10 MG tablet Take 1 tablet (10 mg total) by mouth every morning. Take one tablet  by mouth once a day discontinue azor 11/09/11   Fayrene Helper, MD  aspirin 325 MG tablet Take 325 mg by mouth at bedtime. Take one tablet by mouth daily      Historical Provider, MD  benazepril (LOTENSIN) 40 MG tablet Take 1 tablet (40 mg total) by mouth every morning. Take one tablet by mouth once a day discontinue azor 11/09/11   Fayrene Helper, MD  carvedilol (COREG) 25 MG tablet Take 1 tablet (25 mg total) by mouth 2 (two) times daily with a meal. 11/10/11 11/09/12  Yehuda Savannah, MD  glipiZIDE (GLIPIZIDE XL) 10 MG 24 hr tablet Take 20 mg by mouth every morning. Two tabs by mouth once a day    Historical Provider, MD  Multiple Vitamin (MULITIVITAMIN WITH MINERALS) TABS Take 1 tablet by mouth every morning.    Historical Provider, MD  rosuvastatin (CRESTOR) 40 MG tablet Take 40 mg by mouth at bedtime.     Historical Provider, MD  sitaGLIPtan-metformin (JANUMET) 50-1000 MG per tablet Take 1 tablet by mouth 2 (two) times daily with meals. Take one tablet by mouth two times a day     Historical Provider, MD     Allergies:  No Known Allergies  Social History:   reports that she has never smoked. She has never used smokeless tobacco. She reports that she does not drink alcohol or use illicit drugs.  Family History:  Family History  Problem Relation Age of Onset  . Arthritis Other     Family History  . Heart disease Mother   . Heart attack Mother   . Cancer Father     colon  . Cancer Sister     breast  . Diabetes Brother   . Hypertension Brother   . Stroke Brother      Physical Exam: Filed Vitals:   11/30/11 2225 11/30/11 2246 11/30/11 2250  BP:   133/69  Pulse:   84  Temp:   97.3 F (36.3 C)  TempSrc:   Rectal  Resp:   29  Weight: 90.719 kg (200 lb)  90.719 kg (200 lb)  SpO2:  89% 89%   Blood pressure 133/69, pulse 84, temperature 97.3 F (36.3 C), temperature source Rectal, resp. rate 29, weight 90.719 kg (200 lb), SpO2 89.00%.  GEN:  Sedated on the  ventilator PSYCH:  Unable to evaluate because sedated on the ventilator HEENT: Mucous membranes pink and anicteric; PERRLA; EOM intact; no cervical lymphadenopathy nor thyromegaly or carotid bruit; distended external jugular veins Breasts:: Not examined CHEST WALL: No bruising  CHEST: Breathing at the present rate; diffuse rhonchi; no crackles heard HEART: Regular rate and rhythm; no murmurs rubs or gallops BACK: No kyphosis; ABDOMEN: Obese, soft non-tender; no masses, no organomegaly, normal abdominal bowel sounds; ; no intertriginous candida. Rectal Exam: Not done EXTREMITIES: age-appropriate arthropathy of the hands and knees; no edema; no ulcerations. Genitalia: not examined PULSES: 2+ and symmetric SKIN: Normal hydration no rash or ulceration CNS: Cranial nerves 2-12 grossly intact no focal lateralizing neurologic deficit; but exam is limited by the fact the patient is sedated and ventilated.   Labs & Imaging Results for orders placed during the hospital encounter of 11/30/11 (from the past 48 hour(s))  CBC     Status: Abnormal   Collection Time   11/30/11 11:17 PM      Component Value Range Comment   WBC 11.5 (*) 4.0 - 10.5 (K/uL)    RBC 4.35  3.87 - 5.11 (MIL/uL)    Hemoglobin 10.6 (*) 12.0 - 15.0 (g/dL)    HCT 33.5 (*) 36.0 - 46.0 (%)    MCV 77.0 (*) 78.0 - 100.0 (fL)    MCH 24.4 (*) 26.0 - 34.0 (pg)    MCHC 31.6  30.0 - 36.0 (g/dL)    RDW 17.7 (*) 11.5 - 15.5 (%)    Platelets 313  150 - 400 (K/uL)   DIFFERENTIAL     Status: Normal   Collection Time   11/30/11 11:17 PM      Component Value Range Comment   Neutrophils Relative 64  43 - 77 (%)    Neutro Abs 7.3  1.7 - 7.7 (K/uL)    Lymphocytes Relative 29  12 - 46 (%)    Lymphs Abs 3.3  0.7 - 4.0 (K/uL)    Monocytes Relative 6  3 - 12 (%)    Monocytes Absolute 0.7  0.1 - 1.0 (K/uL)    Eosinophils Relative 2  0 - 5 (%)    Eosinophils Absolute 0.2  0.0 - 0.7 (K/uL)    Basophils Relative 1  0 - 1 (%)    Basophils Absolute  0.1  0.0 - 0.1 (K/uL)   BASIC METABOLIC PANEL     Status: Abnormal   Collection Time   11/30/11 11:17 PM      Component Value Range Comment   Sodium 138  135 - 145 (mEq/L)  Potassium 3.8  3.5 - 5.1 (mEq/L)    Chloride 104  96 - 112 (mEq/L)    CO2 25  19 - 32 (mEq/L)    Glucose, Bld 307 (*) 70 - 99 (mg/dL)    BUN 17  6 - 23 (mg/dL)    Creatinine, Ser 0.87  0.50 - 1.10 (mg/dL)    Calcium 9.3  8.4 - 10.5 (mg/dL)    GFR calc non Af Amer 68 (*) >90 (mL/min)    GFR calc Af Amer 79 (*) >90 (mL/min)   PRO B NATRIURETIC PEPTIDE     Status: Abnormal   Collection Time   11/30/11 11:17 PM      Component Value Range Comment   Pro B Natriuretic peptide (BNP) 703.0 (*) 0 - 125 (pg/mL)   CARDIAC PANEL(CRET KIN+CKTOT+MB+TROPI)     Status: Abnormal   Collection Time   11/30/11 11:17 PM      Component Value Range Comment   Total CK 195 (*) 7 - 177 (U/L)    CK, MB 2.0  0.3 - 4.0 (ng/mL)    Troponin I <0.30  <0.30 (ng/mL)    Relative Index 1.0  0.0 - 2.5    PROTIME-INR     Status: Normal   Collection Time   11/30/11 11:23 PM      Component Value Range Comment   Prothrombin Time 14.2  11.6 - 15.2 (seconds)    INR 1.08  0.00 - 1.49    BLOOD GAS, ARTERIAL     Status: Abnormal   Collection Time   12/01/11 12:25 AM      Component Value Range Comment   FIO2 100.00      Delivery systems VENTILATOR      Mode PRESSURE REGULATED VOLUME CONTROL      VT 5003      Rate 14      Peep/cpap 5.0      pH, Arterial 7.291 (*) 7.350 - 7.400     pCO2 arterial 48.4 (*) 35.0 - 45.0 (mmHg)    pO2, Arterial 125.0 (*) 80.0 - 100.0 (mmHg)    Bicarbonate 22.6  20.0 - 24.0 (mEq/L)    TCO2 21.2  0 - 100 (mmol/L)    Acid-base deficit 3.0 (*) 0.0 - 2.0 (mmol/L)    O2 Saturation 97.8      Patient temperature 37.0      Collection site LEFT RADIAL      Drawn by [id number]      Sample type ARTERIAL      Allens test (pass/fail) PASS  PASS     Dg Chest Portable 1 View  11/30/2011  *RADIOLOGY REPORT*  Clinical Data: Respiratory  distress; endotracheal tube placement.  PORTABLE CHEST - 1 VIEW  Comparison: Chest radiograph performed 10/28/2011  Findings: The patient's endotracheal tube is seen ending 2 cm above the carina.  There is diffuse bilateral airspace opacification, with mild sparing at the lung apices. The appearance is suspicious for diffuse pneumonia, though underlying edema may be present.  No pleural effusion or pneumothorax is seen.  The cardiomediastinal silhouette is mildly enlarged.  Vascular congestion is noted.  No acute osseous abnormalities are identified.  The stomach is mildly distended with air.  IMPRESSION:  1.  Endotracheal tube seen ending 2 cm above the carina. 2.  Diffuse bilateral airspace opacification, suspicious for diffuse pneumonia, though underlying interstitial edema may be present. 3.  Mild cardiomegaly and underlying vascular congestion seen.  Original Report Authenticated By: Santa Lighter, M.D.  Assessment Present on Admission:  Respiratory distress .Acute respiratory failure   .DIABETES MELLITUS, TYPE II .HYPERLIPIDEMIA .Obesity .Anemia .Hypertension .Aortic stenosis .Cardiomyopathy   PLAN: Lady speech is unclear; she does have acute respiratory failure of sudden onset; there is a component of heart failure; she does have a leukocytosis and a chest x-ray appearance of pneumonia; she has a markedly  increased A-a gradient  Will give a dose of Lasix and assess her response, but will also start treatment for community-acquired pneumonia since she was in the hospital less than one month ago;  [facility] consult pulmonologist and cardiologist for assistance with management;  Because of her tenuous blood pressure, will hold antihypertensives including ACE inhibitors and beta blockers for the time being.  Give serial nebulizations, and steroids.  Critical care time: 60 minutes.  Gertude Benito 12/01/2011, 1:20 AM

## 2011-12-01 NOTE — Progress Notes (Signed)
UR Chart Review Completed  

## 2011-12-01 NOTE — ED Notes (Signed)
Patient resting comfortably in bed.  Family at bedside.  Propofol infusing without difficulty.

## 2011-12-01 NOTE — Progress Notes (Signed)
During admission history 2 sons agreed Mother has an allergy to PCN.  Notified Dr. Megan Salon.  Patient on ventilator but mildly awake was asked and she shook her head no.  MD said ok to give primaxim antibiotic.

## 2011-12-01 NOTE — Consult Note (Signed)
ANTIBIOTIC CONSULT NOTE - INITIAL  Pharmacy Consult for Vancomycin, Levaquin, Aztreonam Indication: pneumonia, HCAP  Allergies  Allergen Reactions  . Penicillins     Family unsure of reaction, but certain mom said she was allergic   Patient Measurements: Height: 5\' 7"  (170.2 cm) Weight: 191 lb 12.8 oz (87 kg) IBW/kg (Calculated) : 61.6   Vital Signs: Temp: 98 F (36.7 C) (04/18 0253) Temp src: Oral (04/18 0253) BP: 111/69 mmHg (04/18 1000) Pulse Rate: 80  (04/18 1000) Intake/Output from previous day: 04/17 0701 - 04/18 0700 In: 50 [IV Piggyback:50] Out: 3250 [Urine:3250] Intake/Output from this shift: Total I/O In: 588 [I.V.:84; IV Piggyback:504] Out: 1900 [Urine:1900]  Labs:  [facility] 12/01/11 0830 12/01/11 0511 11/30/11 2317  WBC -- 9.7 11.5*  HGB -- 11.0* 10.6*  PLT -- 296 313  LABCREA -- -- --  CREATININE 0.76 0.75 0.87   Estimated Creatinine Clearance: 78.4 ml/min (by C-G formula based on Cr of 0.76). No results found for this basename: VANCOTROUGH:2,VANCOPEAK:2,VANCORANDOM:2,GENTTROUGH:2,GENTPEAK:2,GENTRANDOM:2,TOBRATROUGH:2,TOBRAPEAK:2,TOBRARND:2,AMIKACINPEAK:2,AMIKACINTROU:2,AMIKACIN:2, in the last 72 hours   Microbiology: Recent Results (from the past 720 hour(s))  CULTURE, BLOOD (ROUTINE X 2)     Status: Normal (Preliminary result)   Collection Time   12/01/11 12:09 AM      Component Value Range Status Comment   Specimen Description BLOOD RIGHT HAND   Final    Special Requests BOTTLES DRAWN AEROBIC AND ANAEROBIC 5CC EACH   Final    Culture NO GROWTH <24 HRS   Final    Report Status PENDING   Incomplete   CULTURE, BLOOD (ROUTINE X 2)     Status: Normal (Preliminary result)   Collection Time   12/01/11 12:35 AM      Component Value Range Status Comment   Specimen Description BLOOD RIGHT ANTECUBITAL   Final    Special Requests BOTTLES DRAWN AEROBIC AND ANAEROBIC 8CC EACH   Final    Culture NO GROWTH <24 HRS   Final    Report Status PENDING   Incomplete    MRSA PCR SCREENING     Status: Abnormal   Collection Time   12/01/11  2:52 AM      Component Value Range Status Comment   MRSA by PCR POSITIVE (*) NEGATIVE  Final    Medical History: Past Medical History  Diagnosis Date  . 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 Hyperlipidemia   . Diabetes mellitus, type 2   . Anxiety   . Aortic stenosis 2009    Echocardiogram 2009-normal EF, mild AS; negative stress nuclear in 2004; negative carotid duplex study in 2009; echo 10/2011-mild LVH, AS-IS-apical akinesis with EF of 20-25%,  mild to mod. AS  . Pneumonia 10/2011    Hospitalized  . Palpitations     freq. PVCs  . Cardiomyopathy 10/2011    EF of 20-25% on echo in 10/2011   Medications:  Scheduled:    . albuterol-ipratropium  4 puff Inhalation Q4H  . antiseptic oral rinse  15 mL Mouth Rinse QID  . aspirin  324 mg Oral NOW   Or  . aspirin  300 mg Rectal NOW  . aztreonam  2 g Intravenous Q8H  . carvedilol  3.125 mg Oral BID WC  . ceFEPime (MAXIPIME) IV  1 g Intravenous Once  . chlorhexidine  15 mL Mouth/Throat BID  . Chlorhexidine Gluconate Cloth  6 each Topical Q0600  . enoxaparin  40 mg Subcutaneous Q24H  . etomidate      .  famotidine (PEPCID) IV  20 mg Intravenous Q12H  . furosemide  40 mg Intravenous Once  . furosemide  40 mg Intravenous Q12H  . insulin aspart  0-9 Units Subcutaneous Q4H  . levofloxacin (LEVAQUIN) IV  500 mg Intravenous Once  . levofloxacin (LEVAQUIN) IV  750 mg Intravenous Q24H  . lidocaine (cardiac) 100 mg/38ml      . methylPREDNISolone sodium succinate  125 mg Intravenous Once  . methylPREDNISolone (SOLU-MEDROL) injection  125 mg Intravenous Q6H  . methylPREDNISolone sodium succinate      . mupirocin ointment  1 application Nasal BID  . potassium chloride  10 mEq Intravenous Q1 Hr x 6  . rocuronium      . spironolactone  12.5 mg Oral Daily  . succinylcholine      . succinylcholine  50 mg Intravenous Once  .  succinylcholine  50 mg Intravenous Once  . vancomycin  1,000 mg Intravenous Once  . vancomycin  1,000 mg Intravenous Q12H  . vecuronium  7 mg Intravenous Once  . DISCONTD: albuterol  4 puff Inhalation Q4H  . DISCONTD: albuterol  4 puff Inhalation Q4H  . DISCONTD: azithromycin  500 mg Intravenous Once  . DISCONTD: cefTRIAXone (ROCEPHIN)  IV  1 g Intravenous Once  . DISCONTD: enoxaparin  30 mg Subcutaneous Q24H  . DISCONTD: enoxaparin  40 mg Subcutaneous Daily  . DISCONTD: ipratropium  4 puff Inhalation Q4H  . DISCONTD: pantoprazole (PROTONIX) IV  40 mg Intravenous Daily  . DISCONTD: potassium chloride  10 mEq Intravenous Q1 Hr x 5   Assessment: Good renal fxn Estimated Creatinine Clearance: 78.4 ml/min (by C-G formula based on Cr of 0.76).  Goal of Therapy:  Vancomycin trough level 15-20 mcg/ml Eradicate infection.  Plan:  Measure antibiotic drug levels at steady state Vancomycin 1gm iv q12hrs Aztreonam 2gm iv q8hrs levaquin 750mg  iv q24hrs De-escalate ABX when appropriate  Hart Robinsons A 12/01/2011,12:09 PM

## 2011-12-01 NOTE — Consult Note (Signed)
CARDIOLOGY CONSULT NOTE  Patient ID: [name] [name] MRN: [id number] DOB/AGE: 1945/09/26 66 y.o.  Admit date: 11/30/2011 Referring Physician: PTH Primary PhysicianMargaret Moshe Cipro, MD, MD Primary Cardiologist: [name] [name] Reason for Consultation: Acute Systolic CHF with VDRF  Active Problems:  DIABETES MELLITUS, TYPE II  HYPERLIPIDEMIA  Obesity  Anemia  Hypertension  Aortic stenosis  Cardiomyopathy  Respiratory distress  Acute respiratory failure  HPI: [name] is a 66 year old obese, African American female patient with known history of systolic dysfunction with akinesis of the anterior septal segment and apex with severely depressed ejection fraction of 20-25% per echocardiogram in March of 2013.  A stress Myoview completed on 11/17/2011 revealed apical myocardial scarring without evidence of ischemia.  EKG shows evidence of previous MIs. Accordingly, ischemic heart disease is the likely etiology for left ventricular dysfunction.  There may be a component of hypertensive heart disease as well.    She was taken to emergency room via EMS with sudden onset of severe shortness of breath. She was intubated in the emergency room secondary to deterioration of respiratory status. Blood pressure was 133/69 O2 sat 89%. Her BNP was 703. Chest x-ray demonstrated mild cardiomegaly with underlying vascular congestion with suspicion for diffuse pneumonia. No underlying interstitial edema may be present. History is limited to current records as she is intubated and unable to communicate well.     She has a history of hypertension, hyperlipidemia, diabetes, anxiety, aortic stenosis, with recent  echocardiogram revealing " mild to moderate stenosis - could be underestimated although valve motion does not appear severely reduced. No significant regurgitation. Mean gradient: 47mm Hg (S). VTI ratio of LVOT to aortic valve:0.27.  Review of systems complete and found to be negative unless listed above   Past  Medical History  Diagnosis Date  . 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 Hyperlipidemia   . Diabetes mellitus, type 2   . Anxiety   . Aortic stenosis 2009    Echocardiogram 2009-normal EF, mild AS; negative stress nuclear in 2004; negative carotid duplex study in 2009; echo 10/2011-mild LVH, AS-IS-apical akinesis with EF of 20-25%,  mild to mod. AS  . Pneumonia 10/2011    Hospitalized  . Palpitations     freq. PVCs  . Cardiomyopathy 10/2011    EF of 20-25% on echo in 10/2011    Family History  Problem Relation Age of Onset  . Arthritis Other     Family History  . Heart disease Mother   . Heart attack Mother   . Diabetes Mother   . Hypertension Mother   . Cancer Father     colon  . Cancer Sister     breast  . Diabetes Brother   . Hypertension Brother   . Stroke Brother     History   Social History  . Marital Status: Married    Spouse Name: N/A    Number of Children: 24  . Years of Education: N/A   Occupational History  . Employed    Social History Main Topics  . Smoking status: Never Smoker   . Smokeless tobacco: Never Used  . Alcohol Use: No  . Drug Use: No  . Sexually Active: No   Other Topics Concern  . Not on file   Social History Narrative  . No narrative on file    Past Surgical History  Procedure Date  . Tubal ligation   . Cesarean section      Prescriptions  prior to admission  Medication Sig Dispense Refill  . acetaminophen (TYLENOL) 325 MG tablet Take 2 tablets (650 mg total) by mouth every 6 (six) hours as needed (or Fever >/= 101).      Marland Kitchen albuterol (PROVENTIL HFA;VENTOLIN HFA) 108 (90 BASE) MCG/ACT inhaler Inhale 2 puffs into the lungs every 6 (six) hours as needed for wheezing.  1 Inhaler  0  . amLODipine (NORVASC) 10 MG tablet Take 1 tablet (10 mg total) by mouth every morning. Take one tablet by mouth once a day discontinue azor  30 tablet  3  . aspirin 325 MG tablet Take 325 mg by mouth at bedtime. Take  one tablet by mouth daily        . benazepril (LOTENSIN) 40 MG tablet Take 1 tablet (40 mg total) by mouth every morning. Take one tablet by mouth once a day discontinue azor  30 tablet  4  . carvedilol (COREG) 25 MG tablet Take 1 tablet (25 mg total) by mouth 2 (two) times daily with a meal.  60 tablet  12  . glipiZIDE (GLIPIZIDE XL) 10 MG 24 hr tablet Take 20 mg by mouth every morning. Two tabs by mouth once a day      . Multiple Vitamin (MULITIVITAMIN WITH MINERALS) TABS Take 1 tablet by mouth every morning.      . rosuvastatin (CRESTOR) 40 MG tablet Take 40 mg by mouth at bedtime.       . sitaGLIPtan-metformin (JANUMET) 50-1000 MG per tablet Take 1 tablet by mouth 2 (two) times daily with meals. Take one tablet by mouth two times a day        Echocardiogram 11/07/2011  Left ventricle:mild LVH. EF of 20% to 25%. There is akinesis of the anteroseptal and apical myocardium. There is akinesis of the inferoseptal myocardium.   Aortic valve:  mild to moderate stenosis  Mean gradient: 20mm Hg   Mitral valve: Calcified annulus. Mild regurgitation directed eccentrically.  PA peak pressure: 67mm Hg (S).  Stress Test: 4/42013 Abnormal pharmacologic stress nuclear myocardial study revealing a nondiagnostic EKG due to the presence of an intraventricular conduction delay at baseline, substantial left ventricular enlargement with moderately impaired left ventricular systolic function in a segmental pattern, and anteroseptal and apical myocardial scarring without apparent ischemia.  Physical Exam: Blood pressure 119/61, pulse 67, temperature 98 F (36.7 C), temperature source Oral, resp. rate 16, height 5\' 7"  (1.702 m), weight 191 lb 12.8 oz (87 kg), SpO2 100.00%.   General: Well developed, well nourished, intubated and mildly sedated. Responsive with "yes or no" head  movements Head: Eyes PERRLA, No xanthomas.   Normal cephalic and atramatic  Lungs: Diminished breath sounds in the bases, no wheezes  are noted. Heart: HRRR S1 S2, 2/6 systolic murmur.   Pulses are 2+ & equal.            No carotid bruit. No JVD.  No abdominal bruits. No femoral bruits. Abdomen: Bowel sounds are positive, abdomen soft and non-tender without masses or                  Hernia's noted. Msk:  Back normal, unable to evaluate strength. Extremities: No clubbing, cyanosis or edema.  DP +1 Neuro: Alert, responsive, intubated. Psych:  Good affect, responds appropriately  I&O--4.2 L since admission.  Weight was increased 15 pounds over baseline at admission, but has decreased 3-4 kg since.  Labs:   Lab Results  Component Value Date   WBC 9.7 12/01/2011  HGB 11.0* 12/01/2011   HCT 34.8* 12/01/2011   MCV 75.7* 12/01/2011   PLT 296 12/01/2011    Lab 12/01/11 0511  NA 140  K 2.9*  CL 102  CO2 24  BUN 18  CREATININE 0.75  CALCIUM 9.4  PROT --  BILITOT --  ALKPHOS --  ALT --  AST --  GLUCOSE 178*   Lab Results  Component Value Date   CKTOTAL 195* 11/30/2011   CKMB 2.0 11/30/2011   TROPONINI <0.30 11/30/2011    Lab Results  Component Value Date   CHOL 146 03/21/2011   CHOL 150 05/27/2010   CHOL 182 05/27/2009   Lab Results  Component Value Date   HDL 42 03/21/2011   HDL 38* 05/27/2010   HDL 60 05/27/2009   Lab Results  Component Value Date   LDLCALC 85 03/21/2011   LDLCALC 85 05/27/2010   LDLCALC 104* 05/27/2009   Lab Results  Component Value Date   TRIG 97 03/21/2011   TRIG 134 05/27/2010   TRIG 90 05/27/2009   Lab Results  Component Value Date   CHOLHDL 3.5 03/21/2011   CHOLHDL 3.9 Ratio 05/27/2010   CHOLHDL 3.0 Ratio 05/27/2009   No results found for this basename: LDLDIRECT    Radiology: Dg Chest Portable 1 View  11/30/2011  *RADIOLOGY REPORT*  Clinical Data: Respiratory distress; endotracheal tube placement.  PORTABLE CHEST - 1 VIEW  Comparison: Chest radiograph performed 10/28/2011   IMPRESSION:  1.  Endotracheal tube seen ending 2 cm above the carina. 2.  Diffuse bilateral airspace  opacification, suspicious for diffuse pneumonia, though underlying interstitial edema may be present. 3.  Mild cardiomegaly and underlying vascular congestion seen.  Original Report Authenticated By: Santa Lighter, M.D.   EKG:NSR with LBBB, LVH is noted. Occasional PVC's.   ASSESSMENT AND PLAN:   1. Ventilator dependent respiratory failure: Currently intubated and stable. This is managed by Dr. Luan Pulling.  ProBNP 703 on admission with significant weight gain. Has responded to intravenous furosemide with improvement. When she was last seen in our office approximately one week ago. Her weight was 184 pounds.  Will require at least another 10 pound diuresis.  2.  Systolic dysfunction with EF of 20-25%:  Cardiac catheterization will be necessary once the patient is extubated and stable to evaluate for ischemic cause of low EF. She would be a candidate for an ICD, but since the onset of cardiomyopathy by clinical criteria with recent, it may not be possible to place an ICD this admission.   For now. Would continue to treat her medically with beta blockers, ACE inhibitor, and aspirin.  3. Hypertension: She is currently stable, and is not on an ACE inhibitor or beta blocker at this time. Would recommend beginning Coreg 3.125 mg twice a day via oral gastric tube. And transition to by mouth when she is extubated.  spironolactone to current medication regimen as well in the setting of systolic dysfunction. Creatinine is 0.87.  Phill Myron. Purcell Nails NP [facility] 12/01/2011, 8:34 AM   Cardiology Attending Patient interviewed and examined. Discussed with Jory Sims, NP.  Above note annotated and modified based upon my findings.  Patient experienced a relatively sudden 15 pound weight increase, apparently resulting in pulmonary edema and respiratory failure. Aortic stenosis with mild and probably is not contributing significantly to her current problems. She was not treated with diuretics, as she had  no history of CHF in the past and had been maintaining her weight since her recent  hospitalization at which time asymptomatic cardiomyopathy was first diagnosed. We had planned for outpatient cardiac catheterization based upon the abnormalities in her EKG and stress test in the setting of severe left ventricular dysfunction, but now the results of that study will be much more important. Once CHF has been adequately stabilized, we can transfer to [facility] for further diagnostic testing and treatment.  Jacqulyn Ducking, MD 12/01/2011, 5:26 PM

## 2011-12-01 NOTE — Progress Notes (Signed)
WUA:  Pt is alert, shaking head to answer questions and answering appropriately, pt is able to follow all commands and moving all extremities.  Pt was just intubated on 11/30/11 at 22:55 and will not be weaning today.

## 2011-12-01 NOTE — Consult Note (Signed)
Consult requested by: Hospitalist service Consult requested for respiratory failure:  HPI: This is a 66 year old who has a complicated medical history including COPD cardiomyopathy with an ejection fraction of about 20-25%, aortic stenosis and recent pneumonia. She came to the emergency room and was found to be in respiratory distress and was intubated in the emergency room. Further history is from the medical record because she's intubated and sedated.  Past Medical History  Diagnosis Date  . 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 Hyperlipidemia   . Diabetes mellitus, type 2   . Anxiety   . Aortic stenosis 2009    Echocardiogram 2009-normal EF, mild AS; negative stress nuclear in 2004; negative carotid duplex study in 2009; echo 10/2011-mild LVH, AS-IS-apical akinesis with EF of 20-25%,  mild to mod. AS  . Pneumonia 10/2011    Hospitalized  . Palpitations     freq. PVCs  . Cardiomyopathy 10/2011    EF of 20-25% on echo in 10/2011     Family History  Problem Relation Age of Onset  . Arthritis Other     Family History  . Heart disease Mother   . Heart attack Mother   . Diabetes Mother   . Hypertension Mother   . Cancer Father     colon  . Cancer Sister     breast  . Diabetes Brother   . Hypertension Brother   . Stroke Brother      History   Social History  . Marital Status: Married    Spouse Name: N/A    Number of Children: 19  . Years of Education: N/A   Occupational History  . Employed    Social History Main Topics  . Smoking status: Never Smoker   . Smokeless tobacco: Never Used  . Alcohol Use: No  . Drug Use: No  . Sexually Active: No   Other Topics Concern  . None   Social History Narrative  . None     ROS: Unobtainable    Objective: Vital signs in last 24 hours: Temp:  [97.3 F (36.3 C)-98 F (36.7 C)] 98 F (36.7 C) (04/18 0253) Pulse Rate:  [67-86] 67  (04/18 0800) Resp:  [14-29] 16  (04/18 0800) BP:  (101-133)/(55-71) 119/61 mmHg (04/18 0800) SpO2:  [89 %-100 %] 100 % (04/18 0800) FiO2 (%):  [64.7 %-100 %] 65.1 % (04/18 0800) Weight:  [86.5 kg (190 lb 11.2 oz)-90.719 kg (200 lb)] 87 kg (191 lb 12.8 oz) (04/18 0500) Weight change:     Intake/Output from previous day: 04/17 0701 - 04/18 0700 In: -  Out: 3250 [Urine:3250]  PHYSICAL EXAM She is intubated and on the ventilator. Her pupils are reactive. Her mucous membranes are moist. Her neck is supple. I do not feel any adenopathy. Her chest shows significant rhonchi bilaterally and there is some secretions in the endotracheal tube. Her heart is regular. Her abdomen soft bowel sounds present and active extremities showed no edema. Central nervous system examination at the time of wakeup assessment was grossly intact  Lab Results: Basic Metabolic Panel:  Basename 12/01/11 0511 11/30/11 2317  NA 140 138  K 2.9* 3.8  CL 102 104  CO2 24 25  GLUCOSE 178* 307*  BUN 18 17  CREATININE 0.75 0.87  CALCIUM 9.4 9.3  MG -- --  PHOS -- --   Liver Function Tests: No results found for this basename: AST:2,ALT:2,ALKPHOS:2,BILITOT:2,PROT:2,ALBUMIN:2 in the last 72 hours No  results found for this basename: LIPASE:2,AMYLASE:2 in the last 72 hours No results found for this basename: AMMONIA:2 in the last 72 hours CBC:  Basename 12/01/11 0511 11/30/11 2317  WBC 9.7 11.5*  NEUTROABS -- 7.3  HGB 11.0* 10.6*  HCT 34.8* 33.5*  MCV 75.7* 77.0*  PLT 296 313   Cardiac Enzymes:  Basename 11/30/11 2317  CKTOTAL 195*  CKMB 2.0  CKMBINDEX --  TROPONINI <0.30   BNP:  Basename 12/01/11 0511 11/30/11 2317  PROBNP 1086.0* 703.0*   D-Dimer: No results found for this basename: DDIMER:2 in the last 72 hours CBG:  Basename 12/01/11 0457  GLUCAP 183*   Hemoglobin A1C: No results found for this basename: HGBA1C in the last 72 hours Fasting Lipid Panel: No results found for this basename: CHOL,HDL,LDLCALC,TRIG,CHOLHDL,LDLDIRECT in the last 72  hours Thyroid Function Tests: No results found for this basename: TSH,T4TOTAL,FREET4,T3FREE,THYROIDAB in the last 72 hours Anemia Panel: No results found for this basename: VITAMINB12,FOLATE,FERRITIN,TIBC,IRON,RETICCTPCT in the last 72 hours Coagulation:  Basename 11/30/11 2323  LABPROT 14.2  INR 1.08   Urine Drug Screen: Drugs of Abuse  No results found for this basename: labopia, cocainscrnur, labbenz, amphetmu, thcu, labbarb    Alcohol Level: No results found for this basename: ETH:2 in the last 72 hours Urinalysis:  Basename 12/01/11 0350 12/01/11 0057  COLORURINE STRAW* YELLOW  LABSPEC 1.010 1.015  PHURINE 5.5 5.5  GLUCOSEU NEGATIVE >1000*  HGBUR SMALL* NEGATIVE  BILIRUBINUR NEGATIVE NEGATIVE  KETONESUR NEGATIVE NEGATIVE  PROTEINUR NEGATIVE NEGATIVE  UROBILINOGEN 0.2 0.2  NITRITE NEGATIVE NEGATIVE  LEUKOCYTESUR NEGATIVE NEGATIVE   Misc. Labs:   ABGS:  Basename 12/01/11 0520  PHART 7.429*  PO2ART 111.0*  TCO2 23.0  HCO3 25.3*     MICROBIOLOGY: Recent Results (from the past 240 hour(s))  CULTURE, BLOOD (ROUTINE X 2)     Status: Normal (Preliminary result)   Collection Time   12/01/11 12:09 AM      Component Value Range Status Comment   Specimen Description BLOOD RIGHT HAND   Final    Special Requests BOTTLES DRAWN AEROBIC AND ANAEROBIC 5CC EACH   Final    Culture PENDING   Incomplete    Report Status PENDING   Incomplete   CULTURE, BLOOD (ROUTINE X 2)     Status: Normal (Preliminary result)   Collection Time   12/01/11 12:35 AM      Component Value Range Status Comment   Specimen Description BLOOD RIGHT ANTECUBITAL   Final    Special Requests BOTTLES DRAWN AEROBIC AND ANAEROBIC [facility] EACH   Final    Culture PENDING   Incomplete    Report Status PENDING   Incomplete     Studies/Results: Dg Chest Portable 1 View  11/30/2011  *RADIOLOGY REPORT*  Clinical Data: Respiratory distress; endotracheal tube placement.  PORTABLE CHEST - 1 VIEW  Comparison: Chest  radiograph performed 10/28/2011  Findings: The patient's endotracheal tube is seen ending 2 cm above the carina.  There is diffuse bilateral airspace opacification, with mild sparing at the lung apices. The appearance is suspicious for diffuse pneumonia, though underlying edema may be present.  No pleural effusion or pneumothorax is seen.  The cardiomediastinal silhouette is mildly enlarged.  Vascular congestion is noted.  No acute osseous abnormalities are identified.  The stomach is mildly distended with air.  IMPRESSION:  1.  Endotracheal tube seen ending 2 cm above the carina. 2.  Diffuse bilateral airspace opacification, suspicious for diffuse pneumonia, though underlying interstitial edema may be present.  3.  Mild cardiomegaly and underlying vascular congestion seen.  Original Report Authenticated By: Santa Lighter, M.D.    Medications:  Scheduled:   . albuterol-ipratropium  4 puff Inhalation Q4H  . antiseptic oral rinse  15 mL Mouth Rinse QID  . aspirin  324 mg Oral NOW   Or  . aspirin  300 mg Rectal NOW  . aztreonam  2 g Intravenous Q8H  . ceFEPime (MAXIPIME) IV  1 g Intravenous Once  . chlorhexidine  15 mL Mouth/Throat BID  . enoxaparin  30 mg Subcutaneous Q24H  . enoxaparin  40 mg Subcutaneous Daily  . etomidate      . famotidine (PEPCID) IV  20 mg Intravenous Q12H  . furosemide  40 mg Intravenous Once  . furosemide  40 mg Intravenous Q12H  . insulin aspart  0-9 Units Subcutaneous Q4H  . levofloxacin (LEVAQUIN) IV  500 mg Intravenous Once  . levofloxacin (LEVAQUIN) IV  750 mg Intravenous Q24H  . lidocaine (cardiac) 100 mg/98ml      . methylPREDNISolone sodium succinate  125 mg Intravenous Once  . methylPREDNISolone (SOLU-MEDROL) injection  125 mg Intravenous Q6H  . methylPREDNISolone sodium succinate      . pantoprazole (PROTONIX) IV  40 mg Intravenous Daily  . potassium chloride  10 mEq Intravenous Q1 Hr x 6  . rocuronium      . succinylcholine      . succinylcholine  50 mg  Intravenous Once  . succinylcholine  50 mg Intravenous Once  . vancomycin  1,000 mg Intravenous Once  . vecuronium  7 mg Intravenous Once  . DISCONTD: albuterol  4 puff Inhalation Q4H  . DISCONTD: albuterol  4 puff Inhalation Q4H  . DISCONTD: azithromycin  500 mg Intravenous Once  . DISCONTD: cefTRIAXone (ROCEPHIN)  IV  1 g Intravenous Once  . DISCONTD: ipratropium  4 puff Inhalation Q4H  . DISCONTD: potassium chloride  10 mEq Intravenous Q1 Hr x 5   Continuous:   . propofol 20 mcg/kg/min (12/01/11 0756)  . DISCONTD: propofol 10 mcg/kg/min (12/01/11 0000)   [id number] chloride  Assesment: She has acute respiratory failure and has what appears to be multifocal pneumonia and possibly some CHF as well. She is intubated and on the ventilator. I don't think we have much opportunity for weaning this morning. I agree with triple antibiotic coverage and IV steroids. Active Problems:  DIABETES MELLITUS, TYPE II  HYPERLIPIDEMIA  Obesity  Anemia  Hypertension  Aortic stenosis  Cardiomyopathy  Respiratory distress  Acute respiratory failure    Plan: Continue treatments we'll reassess for potential weaning tomorrow    LOS: 1 day   Stan Cantave L 12/01/2011, 8:16 AM

## 2011-12-01 NOTE — Progress Notes (Signed)
Patient admitted earlier this morning for acute respiratory failure  She was recently in the hospital last month for CAP She has a history of CHF (systolic and diastolic)  She is currently on the ventilator, pulmonology is following for assistance  She is on triple antibiotics for HCAP She is also on lasix for component of heart failure, cardiology has been consulted  No opportunity to wean at this point, will continue to follow.

## 2011-12-01 NOTE — Plan of Care (Signed)
Problem: Consults Goal: Ventilated Patients Patient Education See Patient Education Module for education specifics. Outcome: Progressing Placed on ventilator in ED. Due to overworking of breathing, pneumonia Goal: Nutrition Consult-if indicated Outcome: Progressing Npo at present

## 2011-12-02 LAB — CBC
HCT: 31.7 % — ABNORMAL LOW (ref 36.0–46.0)
Hemoglobin: 10.5 g/dL — ABNORMAL LOW (ref 12.0–15.0)
MCH: 24.4 pg — ABNORMAL LOW (ref 26.0–34.0)
MCHC: 33.1 g/dL (ref 30.0–36.0)
MCV: 73.5 fL — ABNORMAL LOW (ref 78.0–100.0)
Platelets: 284 10*3/uL (ref 150–400)
RBC: 4.31 MIL/uL (ref 3.87–5.11)
RDW: 17.1 % — ABNORMAL HIGH (ref 11.5–15.5)
WBC: 10 10*3/uL (ref 4.0–10.5)

## 2011-12-02 LAB — GLUCOSE, CAPILLARY
Glucose-Capillary: 207 mg/dL — ABNORMAL HIGH (ref 70–99)
Glucose-Capillary: 259 mg/dL — ABNORMAL HIGH (ref 70–99)
Glucose-Capillary: 270 mg/dL — ABNORMAL HIGH (ref 70–99)
Glucose-Capillary: 274 mg/dL — ABNORMAL HIGH (ref 70–99)
Glucose-Capillary: 300 mg/dL — ABNORMAL HIGH (ref 70–99)
Glucose-Capillary: 335 mg/dL — ABNORMAL HIGH (ref 70–99)

## 2011-12-02 LAB — BASIC METABOLIC PANEL
BUN: 20 mg/dL (ref 6–23)
CO2: 24 mEq/L (ref 19–32)
Calcium: 9 mg/dL (ref 8.4–10.5)
Chloride: 99 mEq/L (ref 96–112)
Creatinine, Ser: 0.87 mg/dL (ref 0.50–1.10)
GFR calc Af Amer: 79 mL/min — ABNORMAL LOW (ref >90)
GFR calc non Af Amer: 68 mL/min — ABNORMAL LOW (ref >90)
Glucose, Bld: 299 mg/dL — ABNORMAL HIGH (ref 70–99)
Potassium: 2.8 mEq/L — ABNORMAL LOW (ref 3.5–5.1)
Sodium: 135 mEq/L (ref 135–145)

## 2011-12-02 LAB — LEGIONELLA ANTIGEN, URINE
Legionella Antigen, Urine: NEGATIVE
Legionella Antigen, Urine: NEGATIVE

## 2011-12-02 LAB — POTASSIUM: Potassium: 4.2 mEq/L (ref 3.5–5.1)

## 2011-12-02 LAB — VANCOMYCIN, TROUGH: Vancomycin Tr: 12.3 ug/mL (ref 10.0–20.0)

## 2011-12-02 MED ORDER — POTASSIUM CHLORIDE 20 MEQ/15ML (10%) PO LIQD
40.0000 meq | Freq: Two times a day (BID) | ORAL | Status: DC
  Administered 2011-12-02 – 2011-12-03 (×3): 40 meq
  Filled 2011-12-02 (×3): qty 30

## 2011-12-02 MED ORDER — POTASSIUM CHLORIDE 10 MEQ/100ML IV SOLN
10.0000 meq | INTRAVENOUS | Status: AC
  Administered 2011-12-02 (×6): 10 meq via INTRAVENOUS
  Filled 2011-12-02: qty 300
  Filled 2011-12-02: qty 200

## 2011-12-02 MED ORDER — FENTANYL CITRATE 0.05 MG/ML IJ SOLN
INTRAMUSCULAR | Status: AC
  Filled 2011-12-02: qty 50

## 2011-12-02 MED ORDER — CARVEDILOL 6.25 MG PO TABS
6.2500 mg | ORAL_TABLET | Freq: Two times a day (BID) | ORAL | Status: DC
  Administered 2011-12-03 – 2011-12-07 (×9): 6.25 mg via ORAL
  Filled 2011-12-02 (×3): qty 1
  Filled 2011-12-02: qty 2
  Filled 2011-12-02 (×2): qty 1
  Filled 2011-12-02: qty 2
  Filled 2011-12-02: qty 1
  Filled 2011-12-02: qty 2
  Filled 2011-12-02: qty 1
  Filled 2011-12-02 (×2): qty 2
  Filled 2011-12-02 (×2): qty 1

## 2011-12-02 MED ORDER — INSULIN ASPART 100 UNIT/ML ~~LOC~~ SOLN
0.0000 [IU] | Freq: Three times a day (TID) | SUBCUTANEOUS | Status: DC
  Administered 2011-12-03: 7 [IU] via SUBCUTANEOUS

## 2011-12-02 MED ORDER — IPRATROPIUM-ALBUTEROL 18-103 MCG/ACT IN AERO
4.0000 | INHALATION_SPRAY | Freq: Four times a day (QID) | RESPIRATORY_TRACT | Status: DC
  Administered 2011-12-03 – 2011-12-06 (×13): 4 via RESPIRATORY_TRACT
  Filled 2011-12-02: qty 14.7

## 2011-12-02 MED ORDER — ONDANSETRON HCL 4 MG/2ML IJ SOLN
4.0000 mg | Freq: Four times a day (QID) | INTRAMUSCULAR | Status: DC | PRN
  Administered 2011-12-02: 4 mg via INTRAVENOUS
  Filled 2011-12-02: qty 2

## 2011-12-02 MED ORDER — METHYLPREDNISOLONE SODIUM SUCC 40 MG IJ SOLR
40.0000 mg | Freq: Four times a day (QID) | INTRAMUSCULAR | Status: DC
Start: 2011-12-02 — End: 2011-12-03
  Administered 2011-12-02 – 2011-12-03 (×4): 40 mg via INTRAVENOUS
  Filled 2011-12-02 (×4): qty 1

## 2011-12-02 MED ORDER — LISINOPRIL 5 MG PO TABS
2.5000 mg | ORAL_TABLET | Freq: Two times a day (BID) | ORAL | Status: DC
  Administered 2011-12-02 – 2011-12-05 (×7): 2.5 mg via ORAL
  Filled 2011-12-02 (×2): qty 0.5
  Filled 2011-12-02 (×3): qty 1
  Filled 2011-12-02: qty 0.5
  Filled 2011-12-02: qty 1
  Filled 2011-12-02: qty 0.5
  Filled 2011-12-02 (×6): qty 1

## 2011-12-02 NOTE — Progress Notes (Addendum)
Inpatient Diabetes Program Recommendations  AACE/ADA: New Consensus Statement on Inpatient Glycemic Control  Target Ranges:  Prepandial:   less than 140 mg/dL      Peak postprandial:   less than 180 mg/dL (1-2 hours)      Critically ill patients:  140 - 180 mg/dL  Pager:  [phone] Hours:  8 am-10pm   Reason for Visit: Elevated glucose on IV steroids  Results for [name] [name], [name] [name] (MRN [id number]) as of 12/02/2011 11:50  Ref. Range 12/01/2011 08:07 12/01/2011 12:26 12/01/2011 16:19 12/01/2011 20:27 12/02/2011 00:02 12/02/2011 04:52 12/02/2011 08:13  Glucose-Capillary Latest Range: 70-99 mg/dL 122 (H) 189 (H) 231 (H) 197 (H) 207 (H) 300 (H) 270 (H)     Inpatient Diabetes Program Recommendations Insulin - Basal: Add Lantus 15 units daily while on IV steroids  May also consider increasing Novolog Correction to Resistant

## 2011-12-02 NOTE — Progress Notes (Addendum)
SUBJECTIVE:Intubated and sedated, but responsive. Denies pain.  Active Problems:  DIABETES MELLITUS, TYPE II  HYPERLIPIDEMIA  Obesity  Anemia  Hypertension  Aortic stenosis  Cardiomyopathy  Respiratory distress  Acute respiratory failure  Basename 12/02/11 0517 12/01/11 0830 12/01/11 0511  NA 135 -- 140  K 2.8* -- 2.9*  CL 99 -- 102  CO2 24 -- 24  GLUCOSE 299* -- 178*  BUN 20 -- 18  CREATININE 0.87 0.76 --  CALCIUM 9.0 -- 9.4  MG -- 1.7 --  PHOS -- -- --   CBC:  Basename 12/02/11 0517 12/01/11 0511 11/30/11 2317  WBC 10.0 9.7 --  NEUTROABS -- -- 7.3  HGB 10.5* 11.0* --  HCT 31.7* 34.8* --  MCV 73.5* 75.7* --  PLT 284 296 --   Cardiac Enzymes:  Basename 11/30/11 2317  CKTOTAL 195*  CKMB 2.0  CKMBINDEX --  TROPONINI <0.30   Hemoglobin A1C:  Basename 12/01/11 0237  HGBA1C 6.5*   RADIOLOGY: Dg Chest Portable 1 View  11/30/2011  *RADIOLOGY REPORT*  Clinical Data: Respiratory distress; endotracheal tube placement.  PORTABLE CHEST - 1 VIEW  Comparison: .  IMPRESSION:  1.  Endotracheal tube seen ending 2 cm above the carina. 2.  Diffuse bilateral airspace opacification, suspicious for diffuse pneumonia, though underlying interstitial edema may be present. 3.  Mild cardiomegaly and underlying vascular congestion seen.  Original Report Authenticated By: Santa Lighter, M.D.   PHYSICAL EXAM BP 124/68  Pulse 67  Temp(Src) 98.5 F (36.9 C) (Axillary)  Resp 15  Ht 5\' 7"  (1.702 m)  Wt 188 lb 11.4 oz (85.6 kg)  BMI 29.56 kg/m2  SpO2 99% General: Well developed, well nourished, intubated.Sleepy Head: Eyes PERRLA, No xanthomas.   Normal cephalic and atramatic  Lungs: Clear bilaterally to auscultation anterior Heart: HRRR normal S1 decreased S2, 3/6 systolic murmur radiating into the carotids. .  Pulses are 2+ & equal. Normal carotid upstrokes             mild JVD.  No abdominal bruits. No femoral bruits. Abdomen: Bowel sounds are positive, abdomen soft and non-tender  without masses or                  Hernia's noted. Msk:  Back normal, normal gait. Normal strength and tone for age. Extremities: No clubbing, cyanosis or edema.  DP +1 Neuro: Alert and oriented X 3. Psych:  Good affect, responds appropriately  TELEMETRY: Reviewed telemetry; NSR with LBBB.  ASSESSMENT AND PLAN:  1. Acute Systolic CHF: She continues to diuresis on current medication regimen of IV Lasix 40 mg every 12 hours. Weight is down from 200 pounds 188 lbs. . Would like to see another 10 pounds of fluid diuresis. Lung sounds are diminished. Bibasilar however, she is lying flat and on ventilator and unable to auscultate clearly from her posterior chest. Dr. Luan Pulling is weaning her from the ventilator today. Hopefully, by late this evening. She will have been placed on only nasal cannula. Plans to transfer to [facility] hospital, once she is more stable   for cardiac catheterization are being discussed. Will need further evaluation to ascertain whether ischemia is causing significant the systolic dysfunction. She may benefit from ICD pacemaker. This will be further evaluated after cardiac catheterization. Continue beta blocker therapy for now.  2. Hypertension: Blood pressure is stable with the addition of Coreg 3.125 mg a low dose ACE inhibitor. Spironolcatone was also recently introduced. Potassium remains low and is being repleted. This am.. We will  watch this closely on spironolactone. Daily. BMETs are ordered.  Phill Myron. Purcell Nails NP [facility] 12/02/2011, 8:12 AM  Cardiology Attending Patient interviewed and examined. Discussed with Jory Sims, NP.  Above note annotated and modified based upon my findings.  Now extubated and doing well.  She has tolerated the substantial diuresis without hypotension or renal dysfunction. ACE inhibitor will be converted to an oral drug. Dose of beta blocker will be advanced. If she remains stable over the weekend, we will plan to proceed to cardiac  catheterization on Monday. This was discussed with patient and her grandson. She was not pleased that she will require tertiary care at [facility], but is willing to proceed.  Jacqulyn Ducking, MD 12/02/2011, 5:47 PM

## 2011-12-02 NOTE — Progress Notes (Signed)
Subjective: Patient is awake, on ventilator, able to communicate by writing on a paper.  Says she is very sleepy.  Respiratory rate falls low.  Objective: Vital signs in last 24 hours: Temp:  [98.1 F (36.7 C)-98.9 F (37.2 C)] 98.5 F (36.9 C) (04/19 0400) Pulse Rate:  [57-92] 67  (04/19 0630) Resp:  [15-22] 15  (04/19 0630) BP: (94-140)/(50-89) 124/68 mmHg (04/19 0615) SpO2:  [94 %-100 %] 100 % (04/19 0630) FiO2 (%):  [38.8 %-65.1 %] 40 % (04/19 0630) Weight:  [85.6 kg (188 lb 11.4 oz)] 85.6 kg (188 lb 11.4 oz) (04/19 0500) Weight change: -5.119 kg (-11 lb 4.6 oz) Last BM Date: 11/30/11  Intake/Output from previous day: 04/18 0701 - 04/19 0700 In: 2215.6 [I.V.:707.6; IV Piggyback:1508] Out: 4350 [Urine:4150; Emesis/NG output:200]     Physical Exam: General: Alert, awake, in no acute distress. Intubated HEENT: No bruits, no goiter. Heart: Regular rate and rhythm, without murmurs, rubs, gallops. Lungs: Clear to auscultation bilaterally. Abdomen: Soft, nontender, nondistended, positive bowel sounds. Extremities: No clubbing cyanosis or edema with positive pedal pulses. Neuro: Grossly intact, nonfocal.    Lab Results: Basic Metabolic Panel:  Basename 12/02/11 0517 12/01/11 0830 12/01/11 0511  NA 135 -- 140  K 2.8* -- 2.9*  CL 99 -- 102  CO2 24 -- 24  GLUCOSE 299* -- 178*  BUN 20 -- 18  CREATININE 0.87 0.76 --  CALCIUM 9.0 -- 9.4  MG -- 1.7 --  PHOS -- -- --   Liver Function Tests: No results found for this basename: AST:2,ALT:2,ALKPHOS:2,BILITOT:2,PROT:2,ALBUMIN:2 in the last 72 hours No results found for this basename: LIPASE:2,AMYLASE:2 in the last 72 hours No results found for this basename: AMMONIA:2 in the last 72 hours CBC:  Basename 12/02/11 0517 12/01/11 0511 11/30/11 2317  WBC 10.0 9.7 --  NEUTROABS -- -- 7.3  HGB 10.5* 11.0* --  HCT 31.7* 34.8* --  MCV 73.5* 75.7* --  PLT 284 296 --   Cardiac Enzymes:  Basename 11/30/11 2317  CKTOTAL 195*    CKMB 2.0  CKMBINDEX --  TROPONINI <0.30   BNP:  Basename 12/01/11 0511 11/30/11 2317  PROBNP 1086.0* 703.0*   D-Dimer: No results found for this basename: DDIMER:2 in the last 72 hours CBG:  Basename 12/02/11 0452 12/02/11 0002 12/01/11 2027 12/01/11 1619 12/01/11 1226 12/01/11 0807  GLUCAP 300* 207* 197* 231* 189* 122*   Hemoglobin A1C:  Basename 12/01/11 0237  HGBA1C 6.5*   Fasting Lipid Panel: No results found for this basename: CHOL,HDL,LDLCALC,TRIG,CHOLHDL,LDLDIRECT in the last 72 hours Thyroid Function Tests: No results found for this basename: TSH,T4TOTAL,FREET4,T3FREE,THYROIDAB in the last 72 hours Anemia Panel: No results found for this basename: VITAMINB12,FOLATE,FERRITIN,TIBC,IRON,RETICCTPCT in the last 72 hours Coagulation:  Basename 11/30/11 2323  LABPROT 14.2  INR 1.08   Urine Drug Screen: Drugs of Abuse  No results found for this basename: labopia, cocainscrnur, labbenz, amphetmu, thcu, labbarb    Alcohol Level: No results found for this basename: ETH:2 in the last 72 hours Urinalysis:  Basename 12/01/11 0350 12/01/11 0057  COLORURINE STRAW* YELLOW  LABSPEC 1.010 1.015  PHURINE 5.5 5.5  GLUCOSEU NEGATIVE >1000*  HGBUR SMALL* NEGATIVE  BILIRUBINUR NEGATIVE NEGATIVE  KETONESUR NEGATIVE NEGATIVE  PROTEINUR NEGATIVE NEGATIVE  UROBILINOGEN 0.2 0.2  NITRITE NEGATIVE NEGATIVE  LEUKOCYTESUR NEGATIVE NEGATIVE    Recent Results (from the past 240 hour(s))  CULTURE, BLOOD (ROUTINE X 2)     Status: Normal (Preliminary result)   Collection Time   12/01/11 12:09 AM  Component Value Range Status Comment   Specimen Description BLOOD RIGHT HAND   Final    Special Requests BOTTLES DRAWN AEROBIC AND ANAEROBIC 5CC EACH   Final    Culture NO GROWTH <24 HRS   Final    Report Status PENDING   Incomplete   CULTURE, BLOOD (ROUTINE X 2)     Status: Normal (Preliminary result)   Collection Time   12/01/11 12:35 AM      Component Value Range Status Comment    Specimen Description BLOOD RIGHT ANTECUBITAL   Final    Special Requests BOTTLES DRAWN AEROBIC AND ANAEROBIC 8CC EACH   Final    Culture NO GROWTH <24 HRS   Final    Report Status PENDING   Incomplete   URINE CULTURE     Status: Normal   Collection Time   12/01/11 12:57 AM      Component Value Range Status Comment   Specimen Description URINE, CATHETERIZED   Final    Special Requests NONE   Final    Culture  Setup Time [id number]   Final    Colony Count NO GROWTH   Final    Culture NO GROWTH   Final    Report Status 12/01/2011 FINAL   Final   MRSA PCR SCREENING     Status: Abnormal   Collection Time   12/01/11  2:52 AM      Component Value Range Status Comment   MRSA by PCR POSITIVE (*) NEGATIVE  Final   CULTURE, RESPIRATORY     Status: Normal (Preliminary result)   Collection Time   12/01/11 11:48 AM      Component Value Range Status Comment   Specimen Description TRACHEAL ASPIRATE   Final    Special Requests NONE   Final    Gram Stain     Final    Value: MODERATE WBC PRESENT,BOTH PMN AND MONONUCLEAR     FEW SQUAMOUS EPITHELIAL CELLS PRESENT     RARE GRAM POSITIVE COCCI IN PAIRS     RARE GRAM POSITIVE RODS   Culture PENDING   Incomplete    Report Status PENDING   Incomplete     Studies/Results: Dg Chest Portable 1 View  11/30/2011  *RADIOLOGY REPORT*  Clinical Data: Respiratory distress; endotracheal tube placement.  PORTABLE CHEST - 1 VIEW  Comparison: Chest radiograph performed 10/28/2011  Findings: The patient's endotracheal tube is seen ending 2 cm above the carina.  There is diffuse bilateral airspace opacification, with mild sparing at the lung apices. The appearance is suspicious for diffuse pneumonia, though underlying edema may be present.  No pleural effusion or pneumothorax is seen.  The cardiomediastinal silhouette is mildly enlarged.  Vascular congestion is noted.  No acute osseous abnormalities are identified.  The stomach is mildly distended with air.  IMPRESSION:   1.  Endotracheal tube seen ending 2 cm above the carina. 2.  Diffuse bilateral airspace opacification, suspicious for diffuse pneumonia, though underlying interstitial edema may be present. 3.  Mild cardiomegaly and underlying vascular congestion seen.  Original Report Authenticated By: Santa Lighter, M.D.    Medications: Scheduled Meds:   . albuterol-ipratropium  4 puff Inhalation Q4H  . antiseptic oral rinse  15 mL Mouth Rinse QID  . aztreonam  2 g Intravenous Q8H  . carvedilol  3.125 mg Oral BID WC  . chlorhexidine  15 mL Mouth/Throat BID  . Chlorhexidine Gluconate Cloth  6 each Topical Q0600  . enalaprilat  1.25 mg Intravenous Q6H  .  enoxaparin  40 mg Subcutaneous Q24H  . famotidine (PEPCID) IV  20 mg Intravenous Q12H  . furosemide  40 mg Intravenous Q12H  . insulin aspart  0-9 Units Subcutaneous Q4H  . levofloxacin (LEVAQUIN) IV  750 mg Intravenous Q24H  . lidocaine (cardiac) 100 mg/57ml      . methylPREDNISolone (SOLU-MEDROL) injection  125 mg Intravenous Q6H  . methylPREDNISolone sodium succinate      . mupirocin ointment  1 application Nasal BID  . potassium chloride  10 mEq Intravenous Q1 Hr x 6  . potassium chloride  10 mEq Intravenous Q1 Hr x 6  . potassium chloride  40 mEq Per Tube BID  . rocuronium      . spironolactone  12.5 mg Oral Daily  . vancomycin  1,000 mg Intravenous Q12H  . DISCONTD: enoxaparin  30 mg Subcutaneous Q24H  . DISCONTD: enoxaparin  40 mg Subcutaneous Daily  . DISCONTD: pantoprazole (PROTONIX) IV  40 mg Intravenous Daily  . DISCONTD: potassium chloride  10 mEq Intravenous Q1 Hr x 5   Continuous Infusions:   . fentaNYL infusion INTRAVENOUS 75 mcg/hr (12/02/11 0603)  . propofol 35 mcg/kg/min (12/02/11 0603)   PRN Meds:.sodium chloride, fentaNYL, phenol  Assessment/Plan:  Active Problems:  DIABETES MELLITUS, TYPE II  HYPERLIPIDEMIA  Obesity  Anemia  Hypertension  Aortic stenosis  Cardiomyopathy  Respiratory distress  Acute respiratory  failure  Plan:  1. Acute respiratory failure. Patient is currently still on the ventilator.  She appears to be improving and may tolerate an extubation today.  Dr. Luan Pulling is following for vent management.  2. Acute on chronic combined CHF.  Patient is having good urine output.  Continue on lasix. Cardiology following.  3. Cardiomyopathy EF 20-25%.  She will need further cardiac evaluation once her respiratory status has improved, and likely transfer to [place].  4. HCAP.  On triple abx.  She has been afebrile.  If she is able to extubate today, we can de escalate her to levofloxacin.  She is currently on steroids and these will be weaned off.  5. Hypokalemia, on lasix, replete with IV potassium  6. DM, on sliding scale insulin, likely worsened by steroids, continue to follow    LOS: 2 days   MEMON,JEHANZEB [facility] Pager: [phone] 12/02/2011, 7:56 AM

## 2011-12-02 NOTE — Progress Notes (Signed)
Subjective: She seems to be doing okay during her wakeup assessment. We're attempting to wean at this point but her respiratory rate has dropped on occasion however she still has some of the sedation on board  Objective: Vital signs in last 24 hours: Temp:  [98.1 F (36.7 C)-98.9 F (37.2 C)] 98.5 F (36.9 C) (04/19 0400) Pulse Rate:  [57-92] 67  (04/19 0630) Resp:  [15-22] 15  (04/19 0630) BP: (94-140)/(50-89) 124/68 mmHg (04/19 0615) SpO2:  [94 %-100 %] 99 % (04/19 0801) FiO2 (%):  [38.8 %-40.3 %] 40 % (04/19 0801) Weight:  [85.6 kg (188 lb 11.4 oz)] 85.6 kg (188 lb 11.4 oz) (04/19 0500) Weight change: -5.119 kg (-11 lb 4.6 oz) Last BM Date: 11/30/11  Intake/Output from previous day: 04/18 0701 - 04/19 0700 In: 2215.6 [I.V.:707.6; IV Piggyback:1508] Out: 4350 [Urine:4150; Emesis/NG output:200]  PHYSICAL EXAM General appearance: alert and mild distress Resp: rhonchi bilaterally Cardio: regular rate and rhythm, S1, S2 normal, no murmur, click, rub or gallop GI: soft, non-tender; bowel sounds normal; no masses,  no organomegaly Extremities: extremities normal, atraumatic, no cyanosis or edema  Lab Results:    Basic Metabolic Panel:  Basename 12/02/11 0517 12/01/11 0830 12/01/11 0511  NA 135 -- 140  K 2.8* -- 2.9*  CL 99 -- 102  CO2 24 -- 24  GLUCOSE 299* -- 178*  BUN 20 -- 18  CREATININE 0.87 0.76 --  CALCIUM 9.0 -- 9.4  MG -- 1.7 --  PHOS -- -- --   Liver Function Tests: No results found for this basename: AST:2,ALT:2,ALKPHOS:2,BILITOT:2,PROT:2,ALBUMIN:2 in the last 72 hours No results found for this basename: LIPASE:2,AMYLASE:2 in the last 72 hours No results found for this basename: AMMONIA:2 in the last 72 hours CBC:  Basename 12/02/11 0517 12/01/11 0511 11/30/11 2317  WBC 10.0 9.7 --  NEUTROABS -- -- 7.3  HGB 10.5* 11.0* --  HCT 31.7* 34.8* --  MCV 73.5* 75.7* --  PLT 284 296 --   Cardiac Enzymes:  Basename 11/30/11 2317  CKTOTAL 195*  CKMB 2.0    CKMBINDEX --  TROPONINI <0.30   BNP:  Basename 12/01/11 0511 11/30/11 2317  PROBNP 1086.0* 703.0*   D-Dimer: No results found for this basename: DDIMER:2 in the last 72 hours CBG:  Basename 12/02/11 0452 12/02/11 0002 12/01/11 2027 12/01/11 1619 12/01/11 1226 12/01/11 0807  GLUCAP 300* 207* 197* 231* 189* 122*   Hemoglobin A1C:  Basename 12/01/11 0237  HGBA1C 6.5*   Fasting Lipid Panel: No results found for this basename: CHOL,HDL,LDLCALC,TRIG,CHOLHDL,LDLDIRECT in the last 72 hours Thyroid Function Tests: No results found for this basename: TSH,T4TOTAL,FREET4,T3FREE,THYROIDAB in the last 72 hours Anemia Panel: No results found for this basename: VITAMINB12,FOLATE,FERRITIN,TIBC,IRON,RETICCTPCT in the last 72 hours Coagulation:  Basename 11/30/11 2323  LABPROT 14.2  INR 1.08   Urine Drug Screen: Drugs of Abuse  No results found for this basename: labopia, cocainscrnur, labbenz, amphetmu, thcu, labbarb    Alcohol Level: No results found for this basename: ETH:2 in the last 72 hours Urinalysis:  Basename 12/01/11 0350 12/01/11 0057  COLORURINE STRAW* YELLOW  LABSPEC 1.010 1.015  PHURINE 5.5 5.5  GLUCOSEU NEGATIVE >1000*  HGBUR SMALL* NEGATIVE  BILIRUBINUR NEGATIVE NEGATIVE  KETONESUR NEGATIVE NEGATIVE  PROTEINUR NEGATIVE NEGATIVE  UROBILINOGEN 0.2 0.2  NITRITE NEGATIVE NEGATIVE  LEUKOCYTESUR NEGATIVE NEGATIVE   Misc. Labs:  ABGS  Basename 12/01/11 0520  PHART 7.429*  PO2ART 111.0*  TCO2 23.0  HCO3 25.3*   CULTURES Recent Results (from the past 240  hour(s))  CULTURE, BLOOD (ROUTINE X 2)     Status: Normal (Preliminary result)   Collection Time   12/01/11 12:09 AM      Component Value Range Status Comment   Specimen Description BLOOD RIGHT HAND   Final    Special Requests BOTTLES DRAWN AEROBIC AND ANAEROBIC 5CC EACH   Final    Culture NO GROWTH <24 HRS   Final    Report Status PENDING   Incomplete   CULTURE, BLOOD (ROUTINE X 2)     Status: Normal  (Preliminary result)   Collection Time   12/01/11 12:35 AM      Component Value Range Status Comment   Specimen Description BLOOD RIGHT ANTECUBITAL   Final    Special Requests BOTTLES DRAWN AEROBIC AND ANAEROBIC 8CC EACH   Final    Culture NO GROWTH <24 HRS   Final    Report Status PENDING   Incomplete   URINE CULTURE     Status: Normal   Collection Time   12/01/11 12:57 AM      Component Value Range Status Comment   Specimen Description URINE, CATHETERIZED   Final    Special Requests NONE   Final    Culture  Setup Time [id number]   Final    Colony Count NO GROWTH   Final    Culture NO GROWTH   Final    Report Status 12/01/2011 FINAL   Final   MRSA PCR SCREENING     Status: Abnormal   Collection Time   12/01/11  2:52 AM      Component Value Range Status Comment   MRSA by PCR POSITIVE (*) NEGATIVE  Final   CULTURE, RESPIRATORY     Status: Normal (Preliminary result)   Collection Time   12/01/11 11:48 AM      Component Value Range Status Comment   Specimen Description TRACHEAL ASPIRATE   Final    Special Requests NONE   Final    Gram Stain     Final    Value: MODERATE WBC PRESENT,BOTH PMN AND MONONUCLEAR     FEW SQUAMOUS EPITHELIAL CELLS PRESENT     RARE GRAM POSITIVE COCCI IN PAIRS     RARE GRAM POSITIVE RODS   Culture PENDING   Incomplete    Report Status PENDING   Incomplete    Studies/Results: Dg Chest Portable 1 View  11/30/2011  *RADIOLOGY REPORT*  Clinical Data: Respiratory distress; endotracheal tube placement.  PORTABLE CHEST - 1 VIEW  Comparison: Chest radiograph performed 10/28/2011  Findings: The patient's endotracheal tube is seen ending 2 cm above the carina.  There is diffuse bilateral airspace opacification, with mild sparing at the lung apices. The appearance is suspicious for diffuse pneumonia, though underlying edema may be present.  No pleural effusion or pneumothorax is seen.  The cardiomediastinal silhouette is mildly enlarged.  Vascular congestion is noted.   No acute osseous abnormalities are identified.  The stomach is mildly distended with air.  IMPRESSION:  1.  Endotracheal tube seen ending 2 cm above the carina. 2.  Diffuse bilateral airspace opacification, suspicious for diffuse pneumonia, though underlying interstitial edema may be present. 3.  Mild cardiomegaly and underlying vascular congestion seen.  Original Report Authenticated By: Santa Lighter, M.D.    Medications:  Scheduled:   . albuterol-ipratropium  4 puff Inhalation Q4H  . antiseptic oral rinse  15 mL Mouth Rinse QID  . aztreonam  2 g Intravenous Q8H  . carvedilol  3.125 mg Oral BID  WC  . chlorhexidine  15 mL Mouth/Throat BID  . Chlorhexidine Gluconate Cloth  6 each Topical Q0600  . enalaprilat  1.25 mg Intravenous Q6H  . enoxaparin  40 mg Subcutaneous Q24H  . famotidine (PEPCID) IV  20 mg Intravenous Q12H  . furosemide  40 mg Intravenous Q12H  . insulin aspart  0-9 Units Subcutaneous Q4H  . levofloxacin (LEVAQUIN) IV  750 mg Intravenous Q24H  . lidocaine (cardiac) 100 mg/71ml      . methylPREDNISolone sodium succinate      . methylPREDNISolone (SOLU-MEDROL) injection  40 mg Intravenous Q6H  . mupirocin ointment  1 application Nasal BID  . potassium chloride  10 mEq Intravenous Q1 Hr x 6  . potassium chloride  10 mEq Intravenous Q1 Hr x 6  . potassium chloride  40 mEq Per Tube BID  . rocuronium      . spironolactone  12.5 mg Oral Daily  . vancomycin  1,000 mg Intravenous Q12H  . DISCONTD: enoxaparin  30 mg Subcutaneous Q24H  . DISCONTD: enoxaparin  40 mg Subcutaneous Daily  . DISCONTD: methylPREDNISolone (SOLU-MEDROL) injection  125 mg Intravenous Q6H  . DISCONTD: pantoprazole (PROTONIX) IV  40 mg Intravenous Daily  . DISCONTD: potassium chloride  10 mEq Intravenous Q1 Hr x 5   Continuous:   . fentaNYL infusion INTRAVENOUS 75 mcg/hr (12/02/11 0603)  . propofol 35 mcg/kg/min (12/02/11 0603)   [id number] chloride, fentaNYL, phenol  Assesment: She has acute  respiratory failure on a ventilator. She also has a cardiomyopathy. She is attempting weaning now. Active Problems:  DIABETES MELLITUS, TYPE II  HYPERLIPIDEMIA  Obesity  Anemia  Hypertension  Aortic stenosis  Cardiomyopathy  Respiratory distress  Acute respiratory failure    Plan: We'll attempt weaning today. I discussed her situation with the hospitalist attending and we will taper and then stop her steroids    LOS: 2 days   [name] [name] 12/02/2011, 8:11 AM

## 2011-12-03 ENCOUNTER — Inpatient Hospital Stay (HOSPITAL_COMMUNITY): Payer: Medicare Other

## 2011-12-03 LAB — CARDIAC PANEL(CRET KIN+CKTOT+MB+TROPI)
CK, MB: 2.5 ng/mL (ref 0.3–4.0)
CK, MB: 2.2 ng/mL (ref 0.3–4.0)
CK, MB: 2.1 ng/mL (ref 0.3–4.0)
Relative Index: 1.1 (ref 0.0–2.5)
Relative Index: 1.3 (ref 0.0–2.5)
Relative Index: 1.3 (ref 0.0–2.5)
Total CK: 222 U/L — ABNORMAL HIGH (ref 7–177)
Total CK: 167 U/L (ref 7–177)
Total CK: 162 U/L (ref 7–177)
Troponin I: <0.3 ng/mL (ref <0.30)
Troponin I: <0.3 ng/mL (ref <0.30)
Troponin I: <0.3 ng/mL (ref <0.30)

## 2011-12-03 LAB — CBC
HCT: 35.1 % — ABNORMAL LOW (ref 36.0–46.0)
Hemoglobin: 11.2 g/dL — ABNORMAL LOW (ref 12.0–15.0)
MCH: 24.1 pg — ABNORMAL LOW (ref 26.0–34.0)
MCHC: 31.9 g/dL (ref 30.0–36.0)
MCV: 75.5 fL — ABNORMAL LOW (ref 78.0–100.0)
Platelets: 298 10*3/uL (ref 150–400)
RBC: 4.65 MIL/uL (ref 3.87–5.11)
RDW: 17.4 % — ABNORMAL HIGH (ref 11.5–15.5)
WBC: 12 10*3/uL — ABNORMAL HIGH (ref 4.0–10.5)

## 2011-12-03 LAB — BASIC METABOLIC PANEL
BUN: 25 mg/dL — ABNORMAL HIGH (ref 6–23)
CO2: 28 mEq/L (ref 19–32)
Calcium: 9.2 mg/dL (ref 8.4–10.5)
Chloride: 98 mEq/L (ref 96–112)
Creatinine, Ser: 0.85 mg/dL (ref 0.50–1.10)
GFR calc Af Amer: 81 mL/min — ABNORMAL LOW (ref >90)
GFR calc non Af Amer: 70 mL/min — ABNORMAL LOW (ref >90)
Glucose, Bld: 308 mg/dL — ABNORMAL HIGH (ref 70–99)
Potassium: 4 mEq/L (ref 3.5–5.1)
Sodium: 134 mEq/L — ABNORMAL LOW (ref 135–145)

## 2011-12-03 LAB — GLUCOSE, CAPILLARY
Glucose-Capillary: 187 mg/dL — ABNORMAL HIGH (ref 70–99)
Glucose-Capillary: 211 mg/dL — ABNORMAL HIGH (ref 70–99)
Glucose-Capillary: 279 mg/dL — ABNORMAL HIGH (ref 70–99)
Glucose-Capillary: 296 mg/dL — ABNORMAL HIGH (ref 70–99)
Glucose-Capillary: 306 mg/dL — ABNORMAL HIGH (ref 70–99)

## 2011-12-03 MED ORDER — INSULIN ASPART 100 UNIT/ML ~~LOC~~ SOLN
0.0000 [IU] | Freq: Three times a day (TID) | SUBCUTANEOUS | Status: DC
  Administered 2011-12-03: 8 [IU] via SUBCUTANEOUS
  Administered 2011-12-03 – 2011-12-04 (×2): 3 [IU] via SUBCUTANEOUS
  Administered 2011-12-04 (×2): 8 [IU] via SUBCUTANEOUS
  Administered 2011-12-05: 11 [IU] via SUBCUTANEOUS
  Administered 2011-12-05: 5 [IU] via SUBCUTANEOUS
  Administered 2011-12-06: 15 [IU] via SUBCUTANEOUS
  Administered 2011-12-06: 8 [IU] via SUBCUTANEOUS
  Administered 2011-12-07: 5 [IU] via SUBCUTANEOUS
  Administered 2011-12-07: 11 [IU] via SUBCUTANEOUS

## 2011-12-03 MED ORDER — POTASSIUM CHLORIDE CRYS ER 20 MEQ PO TBCR
40.0000 meq | EXTENDED_RELEASE_TABLET | Freq: Two times a day (BID) | ORAL | Status: DC
  Administered 2011-12-03 – 2011-12-05 (×4): 40 meq via ORAL
  Filled 2011-12-03 (×4): qty 2

## 2011-12-03 MED ORDER — INSULIN GLARGINE 100 UNIT/ML ~~LOC~~ SOLN
10.0000 [IU] | Freq: Every day | SUBCUTANEOUS | Status: DC
  Administered 2011-12-03 – 2011-12-04 (×2): 10 [IU] via SUBCUTANEOUS

## 2011-12-03 MED ORDER — INSULIN ASPART 100 UNIT/ML ~~LOC~~ SOLN
0.0000 [IU] | Freq: Every day | SUBCUTANEOUS | Status: DC
  Administered 2011-12-03: 3 [IU] via SUBCUTANEOUS
  Administered 2011-12-04: 2 [IU] via SUBCUTANEOUS
  Administered 2011-12-05: 3 [IU] via SUBCUTANEOUS
  Administered 2011-12-06: 2 [IU] via SUBCUTANEOUS

## 2011-12-03 MED ORDER — VANCOMYCIN HCL 1000 MG IV SOLR
1250.0000 mg | Freq: Two times a day (BID) | INTRAVENOUS | Status: DC
  Filled 2011-12-03 (×5): qty 1250

## 2011-12-03 NOTE — Consult Note (Signed)
ANTIBIOTIC CONSULT NOTE - INITIAL  Pharmacy Consult for Vancomycin, Levaquin, Aztreonam Indication: pneumonia, HCAP  Allergies  Allergen Reactions  . Penicillins     Family unsure of reaction, but certain mom said she was allergic   Patient Measurements: Height: 5\' 7"  (170.2 cm) Weight: 186 lb 15.2 oz (84.8 kg) IBW/kg (Calculated) : 61.6   Vital Signs: Temp: 98.7 F (37.1 C) (04/20 0400) Temp src: Oral (04/20 0400) BP: 124/62 mmHg (04/20 0700) Pulse Rate: 72  (04/20 0700) Intake/Output from previous day: 04/19 0701 - 04/20 0700 In: 2220 [P.O.:420; I.V.:340; IV Piggyback:1460] Out: 2000 [Urine:2000] Intake/Output from this shift:    Labs:  Basename 12/03/11 0443 12/02/11 0517 12/01/11 0830 12/01/11 0511  WBC 12.0* 10.0 -- 9.7  HGB 11.2* 10.5* -- 11.0*  PLT 298 284 -- 296  LABCREA -- -- -- --  CREATININE 0.85 0.87 0.76 --   Estimated Creatinine Clearance: 72.9 ml/min (by C-G formula based on Cr of 0.85).  Basename 12/02/11 1336  VANCOTROUGH 12.3  VANCOPEAK --  VANCORANDOM --  GENTTROUGH --  GENTPEAK --  GENTRANDOM --  TOBRATROUGH --  TOBRAPEAK --  TOBRARND --  AMIKACINPEAK --  AMIKACINTROU --  AMIKACIN --     Microbiology: Recent Results (from the past 720 hour(s))  CULTURE, BLOOD (ROUTINE X 2)     Status: Normal (Preliminary result)   Collection Time   12/01/11 12:09 AM      Component Value Range Status Comment   Specimen Description BLOOD RIGHT HAND   Final    Special Requests BOTTLES DRAWN AEROBIC AND ANAEROBIC 5CC EACH   Final    Culture NO GROWTH 1 DAY   Final    Report Status PENDING   Incomplete   CULTURE, BLOOD (ROUTINE X 2)     Status: Normal (Preliminary result)   Collection Time   12/01/11 12:35 AM      Component Value Range Status Comment   Specimen Description BLOOD RIGHT ANTECUBITAL   Final    Special Requests BOTTLES DRAWN AEROBIC AND ANAEROBIC 8CC EACH   Final    Culture NO GROWTH 1 DAY   Final    Report Status PENDING   Incomplete     URINE CULTURE     Status: Normal   Collection Time   12/01/11 12:57 AM      Component Value Range Status Comment   Specimen Description URINE, CATHETERIZED   Final    Special Requests NONE   Final    Culture  Setup Time [id number]   Final    Colony Count NO GROWTH   Final    Culture NO GROWTH   Final    Report Status 12/01/2011 FINAL   Final   MRSA PCR SCREENING     Status: Abnormal   Collection Time   12/01/11  2:52 AM      Component Value Range Status Comment   MRSA by PCR POSITIVE (*) NEGATIVE  Final   CULTURE, RESPIRATORY     Status: Normal (Preliminary result)   Collection Time   12/01/11 11:48 AM      Component Value Range Status Comment   Specimen Description TRACHEAL ASPIRATE   Final    Special Requests NONE   Final    Gram Stain     Final    Value: MODERATE WBC PRESENT,BOTH PMN AND MONONUCLEAR     FEW SQUAMOUS EPITHELIAL CELLS PRESENT     RARE GRAM POSITIVE COCCI IN PAIRS     RARE GRAM POSITIVE  RODS   Culture Culture reincubated for better growth   Final    Report Status PENDING   Incomplete    Medical History: Past Medical History  Diagnosis Date  . 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 Hyperlipidemia   . Diabetes mellitus, type 2   . Anxiety   . Aortic stenosis 2009    Echocardiogram 2009-normal EF, mild AS; negative stress nuclear in 2004; negative carotid duplex study in 2009; echo 10/2011-mild LVH, AS-IS-apical akinesis with EF of 20-25%,  mild to mod. AS  . Pneumonia 10/2011    Hospitalized  . Palpitations     freq. PVCs  . Cardiomyopathy 10/2011    EF of 20-25% on echo in 10/2011   Medications:  Scheduled:     . albuterol-ipratropium  4 puff Inhalation QID  . antiseptic oral rinse  15 mL Mouth Rinse QID  . aztreonam  2 g Intravenous Q8H  . carvedilol  6.25 mg Oral BID WC  . chlorhexidine  15 mL Mouth/Throat BID  . Chlorhexidine Gluconate Cloth  6 each Topical Q0600  . enoxaparin  40 mg Subcutaneous Q24H  .  famotidine (PEPCID) IV  20 mg Intravenous Q12H  . furosemide  40 mg Intravenous Q12H  . insulin aspart  0-9 Units Subcutaneous TID WC  . levofloxacin (LEVAQUIN) IV  750 mg Intravenous Q24H  . lisinopril  2.5 mg Oral BID  . methylPREDNISolone (SOLU-MEDROL) injection  40 mg Intravenous Q6H  . mupirocin ointment  1 application Nasal BID  . potassium chloride  10 mEq Intravenous Q1 Hr x 6  . potassium chloride  40 mEq Per Tube BID  . spironolactone  12.5 mg Oral Daily  . vancomycin  1,000 mg Intravenous Q12H  . DISCONTD: albuterol-ipratropium  4 puff Inhalation Q4H  . DISCONTD: carvedilol  3.125 mg Oral BID WC  . DISCONTD: enalaprilat  1.25 mg Intravenous Q6H  . DISCONTD: insulin aspart  0-9 Units Subcutaneous Q4H  . DISCONTD: methylPREDNISolone (SOLU-MEDROL) injection  125 mg Intravenous Q6H   Assessment: Good renal fxn Estimated Creatinine Clearance: 72.9 ml/min (by C-G formula based on Cr of 0.85). Vancomycin trough below goal  Goal of Therapy:  Vancomycin trough level 15-20 mcg/ml Eradicate infection.  Plan:  Measure antibiotic drug levels at steady state Change Vancomycin to 1250 mg iv q12hrs Aztreonam 2gm iv q8hrs levaquin 750mg  iv q24hrs De-escalate ABX when appropriate  [name] [name], [name] [name] 12/03/2011,7:50 AM

## 2011-12-03 NOTE — Progress Notes (Signed)
Report given to Luci Bank, RN on Dept 300.  Pt is alert & oriented, VSS.  Pt taken to room 318 via wheelchair and placed on telemetry.  Pt's belongings also taken to room with pt.  Pt's family is aware of transfer.

## 2011-12-03 NOTE — Progress Notes (Signed)
Subjective: She was able to be successfully extubated yesterday. She seems to be doing well. She has no new complaints.  Objective: Vital signs in last 24 hours: Temp:  [98.7 F (37.1 C)-99.3 F (37.4 C)] 98.7 F (37.1 C) (04/20 0400) Pulse Rate:  [66-106] 78  (04/20 0800) Resp:  [9-21] 10  (04/20 0800) BP: (110-147)/(54-108) 134/71 mmHg (04/20 0800) SpO2:  [91 %-100 %] 97 % (04/20 0800) FiO2 (%):  [40 %-40.4 %] 40 % (04/19 1145) Weight:  [84.8 kg (186 lb 15.2 oz)] 84.8 kg (186 lb 15.2 oz) (04/20 0500) Weight change: -0.8 kg (-1 lb 12.2 oz) Last BM Date: 11/30/11  Intake/Output from previous day: 04/19 0701 - 04/20 0700 In: 2220 [P.O.:420; I.V.:340; IV Piggyback:1460] Out: 2000 [Urine:2000]  PHYSICAL EXAM General appearance: alert, cooperative and no distress Resp: rhonchi bilaterally Cardio: regular rate and rhythm, S1, S2 normal, no murmur, click, rub or gallop GI: soft, non-tender; bowel sounds normal; no masses,  no organomegaly Extremities: extremities normal, atraumatic, no cyanosis or edema  Lab Results:    Basic Metabolic Panel:  Basename 12/03/11 0443 12/02/11 1615 12/02/11 0517 12/01/11 0830  NA 134* -- 135 --  K 4.0 4.2 -- --  CL 98 -- 99 --  CO2 28 -- 24 --  GLUCOSE 308* -- 299* --  BUN 25* -- 20 --  CREATININE 0.85 -- 0.87 --  CALCIUM 9.2 -- 9.0 --  MG -- -- -- 1.7  PHOS -- -- -- --   Liver Function Tests: No results found for this basename: AST:2,ALT:2,ALKPHOS:2,BILITOT:2,PROT:2,ALBUMIN:2 in the last 72 hours No results found for this basename: LIPASE:2,AMYLASE:2 in the last 72 hours No results found for this basename: AMMONIA:2 in the last 72 hours CBC:  Basename 12/03/11 0443 12/02/11 0517 11/30/11 2317  WBC 12.0* 10.0 --  NEUTROABS -- -- 7.3  HGB 11.2* 10.5* --  HCT 35.1* 31.7* --  MCV 75.5* 73.5* --  PLT 298 284 --   Cardiac Enzymes:  Basename 12/03/11 0709 11/30/11 2317  CKTOTAL 222* 195*  CKMB 2.5 2.0  CKMBINDEX -- --  TROPONINI  <0.30 <0.30   BNP:  Basename 12/01/11 0511 11/30/11 2317  PROBNP 1086.0* 703.0*   D-Dimer: No results found for this basename: DDIMER:2 in the last 72 hours CBG:  Basename 12/02/11 2336 12/02/11 1944 12/02/11 1655 12/02/11 1145 12/02/11 0813 12/02/11 0452  GLUCAP 211* 335* 259* 274* 270* 300*   Hemoglobin A1C:  Basename 12/01/11 0237  HGBA1C 6.5*   Fasting Lipid Panel: No results found for this basename: CHOL,HDL,LDLCALC,TRIG,CHOLHDL,LDLDIRECT in the last 72 hours Thyroid Function Tests: No results found for this basename: TSH,T4TOTAL,FREET4,T3FREE,THYROIDAB in the last 72 hours Anemia Panel: No results found for this basename: VITAMINB12,FOLATE,FERRITIN,TIBC,IRON,RETICCTPCT in the last 72 hours Coagulation:  Basename 11/30/11 2323  LABPROT 14.2  INR 1.08   Urine Drug Screen: Drugs of Abuse  No results found for this basename: labopia, cocainscrnur, labbenz, amphetmu, thcu, labbarb    Alcohol Level: No results found for this basename: ETH:2 in the last 72 hours Urinalysis:  Basename 12/01/11 0350 12/01/11 0057  COLORURINE STRAW* YELLOW  LABSPEC 1.010 1.015  PHURINE 5.5 5.5  GLUCOSEU NEGATIVE >1000*  HGBUR SMALL* NEGATIVE  BILIRUBINUR NEGATIVE NEGATIVE  KETONESUR NEGATIVE NEGATIVE  PROTEINUR NEGATIVE NEGATIVE  UROBILINOGEN 0.2 0.2  NITRITE NEGATIVE NEGATIVE  LEUKOCYTESUR NEGATIVE NEGATIVE   Misc. Labs:  ABGS  Basename 12/01/11 0520  PHART 7.429*  PO2ART 111.0*  TCO2 23.0  HCO3 25.3*   CULTURES Recent Results (from the past  240 hour(s))  CULTURE, BLOOD (ROUTINE X 2)     Status: Normal (Preliminary result)   Collection Time   12/01/11 12:09 AM      Component Value Range Status Comment   Specimen Description BLOOD RIGHT HAND   Final    Special Requests BOTTLES DRAWN AEROBIC AND ANAEROBIC 5CC EACH   Final    Culture NO GROWTH 1 DAY   Final    Report Status PENDING   Incomplete   CULTURE, BLOOD (ROUTINE X 2)     Status: Normal (Preliminary result)    Collection Time   12/01/11 12:35 AM      Component Value Range Status Comment   Specimen Description BLOOD RIGHT ANTECUBITAL   Final    Special Requests BOTTLES DRAWN AEROBIC AND ANAEROBIC 8CC EACH   Final    Culture NO GROWTH 1 DAY   Final    Report Status PENDING   Incomplete   URINE CULTURE     Status: Normal   Collection Time   12/01/11 12:57 AM      Component Value Range Status Comment   Specimen Description URINE, CATHETERIZED   Final    Special Requests NONE   Final    Culture  Setup Time [id number]   Final    Colony Count NO GROWTH   Final    Culture NO GROWTH   Final    Report Status 12/01/2011 FINAL   Final   MRSA PCR SCREENING     Status: Abnormal   Collection Time   12/01/11  2:52 AM      Component Value Range Status Comment   MRSA by PCR POSITIVE (*) NEGATIVE  Final   CULTURE, RESPIRATORY     Status: Normal (Preliminary result)   Collection Time   12/01/11 11:48 AM      Component Value Range Status Comment   Specimen Description TRACHEAL ASPIRATE   Final    Special Requests NONE   Final    Gram Stain     Final    Value: MODERATE WBC PRESENT,BOTH PMN AND MONONUCLEAR     FEW SQUAMOUS EPITHELIAL CELLS PRESENT     RARE GRAM POSITIVE COCCI IN PAIRS     RARE GRAM POSITIVE RODS   Culture Culture reincubated for better growth   Final    Report Status PENDING   Incomplete    Studies/Results: Dg Chest 1 View  12/03/2011  *RADIOLOGY REPORT*  Clinical Data: Respiratory failure.  CHEST - 1 VIEW  Comparison: Plain film chest 11/30/2011.  Findings: Endotracheal tube has been removed.  Bilateral airspace disease shows marked improvement.  There is some fluid in the minor fissure.  Cardiomegaly noted.  IMPRESSION:  1.  Status post extubation. 2.  Marked improvement bilateral in airspace disease.  Original Report Authenticated By: Arvid Right. Luther Parody, M.D.    Medications:  Scheduled:   . albuterol-ipratropium  4 puff Inhalation QID  . antiseptic oral rinse  15 mL Mouth Rinse QID   . aztreonam  2 g Intravenous Q8H  . carvedilol  6.25 mg Oral BID WC  . chlorhexidine  15 mL Mouth/Throat BID  . Chlorhexidine Gluconate Cloth  6 each Topical Q0600  . enoxaparin  40 mg Subcutaneous Q24H  . famotidine (PEPCID) IV  20 mg Intravenous Q12H  . furosemide  40 mg Intravenous Q12H  . insulin aspart  0-9 Units Subcutaneous TID WC  . levofloxacin (LEVAQUIN) IV  750 mg Intravenous Q24H  . lisinopril  2.5 mg Oral BID  .  methylPREDNISolone (SOLU-MEDROL) injection  40 mg Intravenous Q6H  . mupirocin ointment  1 application Nasal BID  . potassium chloride  10 mEq Intravenous Q1 Hr x 6  . potassium chloride  40 mEq Per Tube BID  . spironolactone  12.5 mg Oral Daily  . vancomycin  1,250 mg Intravenous Q12H  . DISCONTD: albuterol-ipratropium  4 puff Inhalation Q4H  . DISCONTD: carvedilol  3.125 mg Oral BID WC  . DISCONTD: enalaprilat  1.25 mg Intravenous Q6H  . DISCONTD: insulin aspart  0-9 Units Subcutaneous Q4H  . DISCONTD: vancomycin  1,000 mg Intravenous Q12H   Continuous:   . DISCONTD: fentaNYL infusion INTRAVENOUS Stopped (12/02/11 0800)  . DISCONTD: propofol Stopped (12/02/11 0800)   [id number] chloride, ondansetron (ZOFRAN) IV, phenol, DISCONTD: fentaNYL  Assesment: She had acute respiratory failure which is multi-factorial. She is better and has been able to be extubated. Active Problems:  DIABETES MELLITUS, TYPE II  HYPERLIPIDEMIA  Obesity  Anemia  Hypertension  Aortic stenosis  Cardiomyopathy  Respiratory distress  Acute respiratory failure    Plan: Continue current supportive care with multiple antibiotics etc.    LOS: 3 days   [name] [name] 12/03/2011, 9:01 AM

## 2011-12-03 NOTE — Progress Notes (Signed)
Subjective: The patient was successfully extubated yesterday. She reports that her breathing has improved. She denies any chest pain. She is coughing up sputum. She feels generally weak.  Objective: Vital signs in last 24 hours: Temp:  [98.7 F (37.1 C)-99.3 F (37.4 C)] 98.7 F (37.1 C) (04/20 0400) Pulse Rate:  [66-106] 90  (04/20 1000) Resp:  [9-21] 11  (04/20 1000) BP: (110-146)/(54-108) 135/67 mmHg (04/20 1000) SpO2:  [91 %-100 %] 97 % (04/20 1000) FiO2 (%):  [40 %-40.4 %] 40 % (04/19 1145) Weight:  [84.8 kg (186 lb 15.2 oz)] 84.8 kg (186 lb 15.2 oz) (04/20 0500) Weight change: -0.8 kg (-1 lb 12.2 oz) Last BM Date: 11/30/11  Intake/Output from previous day: 04/19 0701 - 04/20 0700 In: 2220 [P.O.:420; I.V.:340; IV Piggyback:1460] Out: 2000 [Urine:2000] Total I/O In: 260 [I.V.:40; IV Piggyback:220] Out: 1000 [Urine:1000]   Physical Exam: General: Alert, awake, oriented x3, in no acute distress. HEENT: No bruits, no goiter. Heart: Regular rate and rhythm, without murmurs, rubs, gallops. Lungs: Crackles at bases.. Abdomen: Soft, nontender, nondistended, positive bowel sounds. Extremities: Trace edema bilaterally. Neuro: Grossly intact, nonfocal.    Lab Results: Basic Metabolic Panel:  Basename 12/03/11 0443 12/02/11 1615 12/02/11 0517 12/01/11 0830  NA 134* -- 135 --  K 4.0 4.2 -- --  CL 98 -- 99 --  CO2 28 -- 24 --  GLUCOSE 308* -- 299* --  BUN 25* -- 20 --  CREATININE 0.85 -- 0.87 --  CALCIUM 9.2 -- 9.0 --  MG -- -- -- 1.7  PHOS -- -- -- --   Liver Function Tests: No results found for this basename: AST:2,ALT:2,ALKPHOS:2,BILITOT:2,PROT:2,ALBUMIN:2 in the last 72 hours No results found for this basename: LIPASE:2,AMYLASE:2 in the last 72 hours No results found for this basename: AMMONIA:2 in the last 72 hours CBC:  Basename 12/03/11 0443 12/02/11 0517 11/30/11 2317  WBC 12.0* 10.0 --  NEUTROABS -- -- 7.3  HGB 11.2* 10.5* --  HCT 35.1* 31.7* --  MCV  75.5* 73.5* --  PLT 298 284 --   Cardiac Enzymes:  Basename 12/03/11 0709 11/30/11 2317  CKTOTAL 222* 195*  CKMB 2.5 2.0  CKMBINDEX -- --  TROPONINI <0.30 <0.30   BNP:  Basename 12/01/11 0511 11/30/11 2317  PROBNP 1086.0* 703.0*   D-Dimer: No results found for this basename: DDIMER:2 in the last 72 hours CBG:  Basename 12/02/11 2336 12/02/11 1944 12/02/11 1655 12/02/11 1145 12/02/11 0813 12/02/11 0452  GLUCAP 211* 335* 259* 274* 270* 300*   Hemoglobin A1C:  Basename 12/01/11 0237  HGBA1C 6.5*   Fasting Lipid Panel: No results found for this basename: CHOL,HDL,LDLCALC,TRIG,CHOLHDL,LDLDIRECT in the last 72 hours Thyroid Function Tests: No results found for this basename: TSH,T4TOTAL,FREET4,T3FREE,THYROIDAB in the last 72 hours Anemia Panel: No results found for this basename: VITAMINB12,FOLATE,FERRITIN,TIBC,IRON,RETICCTPCT in the last 72 hours Coagulation:  Basename 11/30/11 2323  LABPROT 14.2  INR 1.08   Urine Drug Screen: Drugs of Abuse  No results found for this basename: labopia, cocainscrnur, labbenz, amphetmu, thcu, labbarb    Alcohol Level: No results found for this basename: ETH:2 in the last 72 hours Urinalysis:  Basename 12/01/11 0350 12/01/11 0057  COLORURINE STRAW* YELLOW  LABSPEC 1.010 1.015  PHURINE 5.5 5.5  GLUCOSEU NEGATIVE >1000*  HGBUR SMALL* NEGATIVE  BILIRUBINUR NEGATIVE NEGATIVE  KETONESUR NEGATIVE NEGATIVE  PROTEINUR NEGATIVE NEGATIVE  UROBILINOGEN 0.2 0.2  NITRITE NEGATIVE NEGATIVE  LEUKOCYTESUR NEGATIVE NEGATIVE    Recent Results (from the past 240 hour(s))  CULTURE, BLOOD (  ROUTINE X 2)     Status: Normal (Preliminary result)   Collection Time   12/01/11 12:09 AM      Component Value Range Status Comment   Specimen Description BLOOD RIGHT HAND   Final    Special Requests BOTTLES DRAWN AEROBIC AND ANAEROBIC 5CC EACH   Final    Culture NO GROWTH 1 DAY   Final    Report Status PENDING   Incomplete   CULTURE, BLOOD (ROUTINE X 2)      Status: Normal (Preliminary result)   Collection Time   12/01/11 12:35 AM      Component Value Range Status Comment   Specimen Description BLOOD RIGHT ANTECUBITAL   Final    Special Requests BOTTLES DRAWN AEROBIC AND ANAEROBIC 8CC EACH   Final    Culture NO GROWTH 1 DAY   Final    Report Status PENDING   Incomplete   URINE CULTURE     Status: Normal   Collection Time   12/01/11 12:57 AM      Component Value Range Status Comment   Specimen Description URINE, CATHETERIZED   Final    Special Requests NONE   Final    Culture  Setup Time [id number]   Final    Colony Count NO GROWTH   Final    Culture NO GROWTH   Final    Report Status 12/01/2011 FINAL   Final   MRSA PCR SCREENING     Status: Abnormal   Collection Time   12/01/11  2:52 AM      Component Value Range Status Comment   MRSA by PCR POSITIVE (*) NEGATIVE  Final   CULTURE, RESPIRATORY     Status: Normal (Preliminary result)   Collection Time   12/01/11 11:48 AM      Component Value Range Status Comment   Specimen Description TRACHEAL ASPIRATE   Final    Special Requests NONE   Final    Gram Stain     Final    Value: MODERATE WBC PRESENT,BOTH PMN AND MONONUCLEAR     FEW SQUAMOUS EPITHELIAL CELLS PRESENT     RARE GRAM POSITIVE COCCI IN PAIRS     RARE GRAM POSITIVE RODS   Culture Culture reincubated for better growth   Final    Report Status PENDING   Incomplete     Studies/Results: Dg Chest 1 View  12/03/2011  *RADIOLOGY REPORT*  Clinical Data: Respiratory failure.  CHEST - 1 VIEW  Comparison: Plain film chest 11/30/2011.  Findings: Endotracheal tube has been removed.  Bilateral airspace disease shows marked improvement.  There is some fluid in the minor fissure.  Cardiomegaly noted.  IMPRESSION:  1.  Status post extubation. 2.  Marked improvement bilateral in airspace disease.  Original Report Authenticated By: Arvid Right. Luther Parody, M.D.    Medications: Scheduled Meds:   . albuterol-ipratropium  4 puff Inhalation QID   . antiseptic oral rinse  15 mL Mouth Rinse QID  . aztreonam  2 g Intravenous Q8H  . carvedilol  6.25 mg Oral BID WC  . chlorhexidine  15 mL Mouth/Throat BID  . Chlorhexidine Gluconate Cloth  6 each Topical Q0600  . enoxaparin  40 mg Subcutaneous Q24H  . famotidine (PEPCID) IV  20 mg Intravenous Q12H  . furosemide  40 mg Intravenous Q12H  . insulin aspart  0-9 Units Subcutaneous TID WC  . levofloxacin (LEVAQUIN) IV  750 mg Intravenous Q24H  . lisinopril  2.5 mg Oral BID  . methylPREDNISolone (SOLU-MEDROL)  injection  40 mg Intravenous Q6H  . mupirocin ointment  1 application Nasal BID  . potassium chloride  10 mEq Intravenous Q1 Hr x 6  . potassium chloride  40 mEq Oral BID  . spironolactone  12.5 mg Oral Daily  . vancomycin  1,250 mg Intravenous Q12H  . DISCONTD: albuterol-ipratropium  4 puff Inhalation Q4H  . DISCONTD: carvedilol  3.125 mg Oral BID WC  . DISCONTD: enalaprilat  1.25 mg Intravenous Q6H  . DISCONTD: insulin aspart  0-9 Units Subcutaneous Q4H  . DISCONTD: potassium chloride  40 mEq Per Tube BID  . DISCONTD: vancomycin  1,000 mg Intravenous Q12H   Continuous Infusions:   . DISCONTD: fentaNYL infusion INTRAVENOUS Stopped (12/02/11 0800)  . DISCONTD: propofol Stopped (12/02/11 0800)   PRN Meds:.sodium chloride, ondansetron (ZOFRAN) IV, phenol, DISCONTD: fentaNYL  Assessment/Plan:  Active Problems:  DIABETES MELLITUS, TYPE II  HYPERLIPIDEMIA  Obesity  Anemia  Hypertension  Aortic stenosis  Cardiomyopathy  Respiratory distress  Acute respiratory failure  Plan:  1. Acute respiratory failure. Patient was successfully extubated yesterday. Her respiratory status has significantly improved and she is on 2 L of oxygen. We will discontinue steroids today.   2. Acute on chronic combined CHF. Patient is having good urine output. Continue on lasix. Cardiology following.   3. Cardiomyopathy EF 20-25%. She will need further cardiac evaluation once her respiratory  status has improved, and likely transfer to [place].   4. HCAP. On triple abx. She has been afebrile. We will de-escalate antibiotics to just Levaquin.  5. Hypokalemia, on lasix, improved, continue daily replacement  6. DM, on sliding scale insulin, likely worsened by steroids. Steroids will be discontinued today. Start low dose Lantus.  7. Transfer to telemetry   LOS: 3 days   Maelle Sheaffer [facility] Pager: [phone] 12/03/2011, 10:40 AM

## 2011-12-04 LAB — CBC
HCT: 39.8 % (ref 36.0–46.0)
Hemoglobin: 12.8 g/dL (ref 12.0–15.0)
MCH: 24.2 pg — ABNORMAL LOW (ref 26.0–34.0)
MCHC: 32.2 g/dL (ref 30.0–36.0)
MCV: 75.1 fL — ABNORMAL LOW (ref 78.0–100.0)
Platelets: 332 10*3/uL (ref 150–400)
RBC: 5.3 MIL/uL — ABNORMAL HIGH (ref 3.87–5.11)
RDW: 17.1 % — ABNORMAL HIGH (ref 11.5–15.5)
WBC: 10.9 10*3/uL — ABNORMAL HIGH (ref 4.0–10.5)

## 2011-12-04 LAB — CULTURE, RESPIRATORY

## 2011-12-04 LAB — BASIC METABOLIC PANEL
BUN: 26 mg/dL — ABNORMAL HIGH (ref 6–23)
CO2: 32 mEq/L (ref 19–32)
Calcium: 10.3 mg/dL (ref 8.4–10.5)
Chloride: 96 mEq/L (ref 96–112)
Creatinine, Ser: 0.82 mg/dL (ref 0.50–1.10)
GFR calc Af Amer: 85 mL/min — ABNORMAL LOW (ref >90)
GFR calc non Af Amer: 73 mL/min — ABNORMAL LOW (ref >90)
Glucose, Bld: 268 mg/dL — ABNORMAL HIGH (ref 70–99)
Potassium: 3.8 mEq/L (ref 3.5–5.1)
Sodium: 137 mEq/L (ref 135–145)

## 2011-12-04 LAB — GLUCOSE, CAPILLARY
Glucose-Capillary: 266 mg/dL — ABNORMAL HIGH (ref 70–99)
Glucose-Capillary: 291 mg/dL — ABNORMAL HIGH (ref 70–99)
Glucose-Capillary: 197 mg/dL — ABNORMAL HIGH (ref 70–99)
Glucose-Capillary: 245 mg/dL — ABNORMAL HIGH (ref 70–99)

## 2011-12-04 LAB — CULTURE, RESPIRATORY W GRAM STAIN

## 2011-12-04 MED ORDER — SODIUM CHLORIDE 0.9 % IJ SOLN
INTRAMUSCULAR | Status: AC
  Filled 2011-12-04: qty 3

## 2011-12-04 MED ORDER — SODIUM CHLORIDE 0.9 % IJ SOLN
INTRAMUSCULAR | Status: AC
  Administered 2011-12-04: 3 mL
  Filled 2011-12-04: qty 3

## 2011-12-04 MED ORDER — LEVOFLOXACIN IN D5W 750 MG/150ML IV SOLN
750.0000 mg | INTRAVENOUS | Status: AC
  Administered 2011-12-04 – 2011-12-07 (×4): 750 mg via INTRAVENOUS
  Filled 2011-12-04 (×5): qty 150

## 2011-12-04 MED ORDER — INSULIN GLARGINE 100 UNIT/ML ~~LOC~~ SOLN
15.0000 [IU] | Freq: Every day | SUBCUTANEOUS | Status: DC
  Administered 2011-12-05 – 2011-12-07 (×3): 15 [IU] via SUBCUTANEOUS

## 2011-12-04 NOTE — Progress Notes (Signed)
Subjective: Patient denies any complaints, she has no chest pain or shortness of breath. She is feeling significantly better  Objective: Vital signs in last 24 hours: Temp:  [97.4 F (36.3 C)-98.4 F (36.9 C)] 98.4 F (36.9 C) (04/21 1315) Pulse Rate:  [68-96] 96  (04/21 1726) Resp:  [18] 18  (04/21 0532) BP: (102-118)/(66-77) 118/77 mmHg (04/21 1726) SpO2:  [89 %-98 %] 96 % (04/21 1549) Weight:  [80.015 kg (176 lb 6.4 oz)] 80.015 kg (176 lb 6.4 oz) (04/21 0532) Weight change: -4.785 kg (-10 lb 8.8 oz) Last BM Date: 12/03/11  Intake/Output from previous day: 04/20 0701 - 04/21 0700 In: 920 [P.O.:660; I.V.:40; IV Piggyback:220] Out: 1500 [Urine:1500] Total I/O In: 365 [P.O.:365] Out: -    Physical Exam: General: Alert, awake, oriented x3, in no acute distress. HEENT: No bruits, no goiter. Heart: Regular rate and rhythm, without murmurs, rubs, gallops. Lungs: Diminished breath sounds at bases. Abdomen: Soft, nontender, nondistended, positive bowel sounds. Extremities: Trace edema bilaterally Neuro: Grossly intact, nonfocal.    Lab Results: Basic Metabolic Panel:  Basename 12/04/11 0755 12/03/11 0443  NA 137 134*  K 3.8 4.0  CL 96 98  CO2 32 28  GLUCOSE 268* 308*  BUN 26* 25*  CREATININE 0.82 0.85  CALCIUM 10.3 9.2  MG -- --  PHOS -- --   Liver Function Tests: No results found for this basename: AST:2,ALT:2,ALKPHOS:2,BILITOT:2,PROT:2,ALBUMIN:2 in the last 72 hours No results found for this basename: LIPASE:2,AMYLASE:2 in the last 72 hours No results found for this basename: AMMONIA:2 in the last 72 hours CBC:  Basename 12/04/11 0755 12/03/11 0443  WBC 10.9* 12.0*  NEUTROABS -- --  HGB 12.8 11.2*  HCT 39.8 35.1*  MCV 75.1* 75.5*  PLT 332 298   Cardiac Enzymes:  Basename 12/03/11 2138 12/03/11 1450 12/03/11 0709  CKTOTAL 162 167 222*  CKMB 2.1 2.2 2.5  CKMBINDEX -- -- --  TROPONINI <0.30 <0.30 <0.30   BNP: No results found for this basename:  PROBNP:3 in the last 72 hours D-Dimer: No results found for this basename: DDIMER:2 in the last 72 hours CBG:  Basename 12/04/11 1638 12/04/11 1142 12/04/11 0742 12/03/11 2141 12/03/11 1635 12/03/11 1125  GLUCAP 197* 291* 266* 279* 187* 296*   Hemoglobin A1C: No results found for this basename: HGBA1C in the last 72 hours Fasting Lipid Panel: No results found for this basename: CHOL,HDL,LDLCALC,TRIG,CHOLHDL,LDLDIRECT in the last 72 hours Thyroid Function Tests: No results found for this basename: TSH,T4TOTAL,FREET4,T3FREE,THYROIDAB in the last 72 hours Anemia Panel: No results found for this basename: VITAMINB12,FOLATE,FERRITIN,TIBC,IRON,RETICCTPCT in the last 72 hours Coagulation: No results found for this basename: LABPROT:2,INR:2 in the last 72 hours Urine Drug Screen: Drugs of Abuse  No results found for this basename: labopia, cocainscrnur, labbenz, amphetmu, thcu, labbarb    Alcohol Level: No results found for this basename: ETH:2 in the last 72 hours Urinalysis: No results found for this basename: COLORURINE:2,APPERANCEUR:2,LABSPEC:2,PHURINE:2,GLUCOSEU:2,HGBUR:2,BILIRUBINUR:2,KETONESUR:2,PROTEINUR:2,UROBILINOGEN:2,NITRITE:2,LEUKOCYTESUR:2 in the last 72 hours  Recent Results (from the past 240 hour(s))  CULTURE, BLOOD (ROUTINE X 2)     Status: Normal (Preliminary result)   Collection Time   12/01/11 12:09 AM      Component Value Range Status Comment   Specimen Description BLOOD RIGHT HAND   Final    Special Requests BOTTLES DRAWN AEROBIC AND ANAEROBIC 5CC EACH   Final    Culture NO GROWTH 3 DAYS   Final    Report Status PENDING   Incomplete   CULTURE, BLOOD (ROUTINE X 2)  Status: Normal (Preliminary result)   Collection Time   12/01/11 12:35 AM      Component Value Range Status Comment   Specimen Description BLOOD RIGHT ANTECUBITAL   Final    Special Requests BOTTLES DRAWN AEROBIC AND ANAEROBIC 8CC EACH   Final    Culture NO GROWTH 3 DAYS   Final    Report Status  PENDING   Incomplete   URINE CULTURE     Status: Normal   Collection Time   12/01/11 12:57 AM      Component Value Range Status Comment   Specimen Description URINE, CATHETERIZED   Final    Special Requests NONE   Final    Culture  Setup Time [id number]   Final    Colony Count NO GROWTH   Final    Culture NO GROWTH   Final    Report Status 12/01/2011 FINAL   Final   MRSA PCR SCREENING     Status: Abnormal   Collection Time   12/01/11  2:52 AM      Component Value Range Status Comment   MRSA by PCR POSITIVE (*) NEGATIVE  Final   CULTURE, RESPIRATORY     Status: Normal (Preliminary result)   Collection Time   12/01/11 11:48 AM      Component Value Range Status Comment   Specimen Description TRACHEAL ASPIRATE   Final    Special Requests NONE   Final    Gram Stain     Final    Value: MODERATE WBC PRESENT,BOTH PMN AND MONONUCLEAR     FEW SQUAMOUS EPITHELIAL CELLS PRESENT     RARE GRAM POSITIVE COCCI IN PAIRS     RARE GRAM POSITIVE RODS   Culture Non-Pathogenic Oropharyngeal-type Flora Isolated.   Final    Report Status PENDING   Incomplete     Studies/Results: Dg Chest 1 View  12/03/2011  *RADIOLOGY REPORT*  Clinical Data: Respiratory failure.  CHEST - 1 VIEW  Comparison: Plain film chest 11/30/2011.  Findings: Endotracheal tube has been removed.  Bilateral airspace disease shows marked improvement.  There is some fluid in the minor fissure.  Cardiomegaly noted.  IMPRESSION:  1.  Status post extubation. 2.  Marked improvement bilateral in airspace disease.  Original Report Authenticated By: Arvid Right. Luther Parody, M.D.    Medications: Scheduled Meds:   . albuterol-ipratropium  4 puff Inhalation QID  . antiseptic oral rinse  15 mL Mouth Rinse QID  . carvedilol  6.25 mg Oral BID WC  . chlorhexidine  15 mL Mouth/Throat BID  . Chlorhexidine Gluconate Cloth  6 each Topical Q0600  . enoxaparin  40 mg Subcutaneous Q24H  . famotidine (PEPCID) IV  20 mg Intravenous Q12H  . furosemide  40  mg Intravenous Q12H  . insulin aspart  0-15 Units Subcutaneous TID WC  . insulin aspart  0-5 Units Subcutaneous QHS  . insulin glargine  10 Units Subcutaneous Daily  . levofloxacin (LEVAQUIN) IV  750 mg Intravenous Q24H  . lisinopril  2.5 mg Oral BID  . mupirocin ointment  1 application Nasal BID  . potassium chloride  40 mEq Oral BID  . sodium chloride      . sodium chloride      . sodium chloride      . spironolactone  12.5 mg Oral Daily   Continuous Infusions:  PRN Meds:.sodium chloride, ondansetron (ZOFRAN) IV, phenol  Assessment/Plan:  Active Problems:  DIABETES MELLITUS, TYPE II  HYPERLIPIDEMIA  Obesity  Anemia  Hypertension  Aortic stenosis  Cardiomyopathy  Respiratory distress  Acute respiratory failure  Plan:  This lady was admitted to the hospital for shortness of breath and respiratory failure, who quickly required intubation and mechanical ventilation in the emergency room. Her respiratory failure was felt to be multifactorial.  #1. Acute respiratory failure, ventilator dependent, improved. Patient was initially intubated on admission. With medical therapy she has been able to extubate from the ventilator and is currently on minimal oxygen. She's been followed by pulmonology.  #2. Acute on chronic systolic CHF, ejection fraction 20-25%. Patient has been aggressively diuresed with IV Lasix. Her weight has gone from 87 kg to 80kg. She's been followed by cardiology here in the hospital. Per cardiology, she may be transferred to [facility] for further evaluation of her cardiomyopathy.  #3. Healthcare acquired pneumonia. Patient was initially placed on broad-spectrum antibiotics. She is currently been de-escalated to Levaquin. Will complete a total of 7 days antibiotics.  #4. Type 2 diabetes. Will increase Lantus.  #5. Microcytic anemia. Check anemia panel and stool occult bloods.   LOS: 4 days   [facility] Pager: [phone] 12/04/2011, 5:40  PM

## 2011-12-04 NOTE — Progress Notes (Signed)
NAMECAMYAH, DERDEN               ACCOUNT NO.:  [id number]  MEDICAL RECORD NO.:  [id number]  LOCATION:                                 FACILITY:  PHYSICIAN:  Ardis Lawley L. Luan Pulling, M.D.DATE OF BIRTH:  14-May-1946  DATE OF PROCEDURE: DATE OF DISCHARGE:                                PROGRESS NOTE   Ms. Monaco has been moved from the intensive care unit.  She seems to be doing well and has no new complaints.  PHYSICAL EXAMINATION:  VITAL SIGNS:  Temperature 98.4, pulse 68, respirations 18, blood pressure 105/66, O2 sat was 89%. CHEST:  A little clearer. HEART:  Regular. ABDOMEN:  Soft.  ASSESSMENT:  I think overall she is better.  She is off the ventilator, of course.  She has been moved to a regular room, and I will plan to follow more peripherally.  We would be happy to be more involved at any time at your request.     Percell Miller L. Luan Pulling, M.D.     ELH/MEDQ  D:  12/04/2011  T:  12/04/2011  Job:  [id number]

## 2011-12-05 ENCOUNTER — Encounter (HOSPITAL_COMMUNITY)
Admission: EM | Disposition: A | Discharge status: Home Health | Payer: Self-pay | Source: Home / Self Care | Attending: Internal Medicine

## 2011-12-05 ENCOUNTER — Encounter (HOSPITAL_COMMUNITY): Payer: Self-pay | Admitting: General Practice

## 2011-12-05 ENCOUNTER — Ambulatory Visit (HOSPITAL_COMMUNITY): Admit: 2011-12-05 | Payer: Self-pay | Admitting: Cardiovascular Disease

## 2011-12-05 DIAGNOSIS — I509 Heart failure, unspecified: Secondary | ICD-10-CM

## 2011-12-05 HISTORY — PX: LEFT AND RIGHT HEART CATHETERIZATION WITH CORONARY ANGIOGRAM: SHX5449

## 2011-12-05 LAB — CBC
HCT: 39 % (ref 36.0–46.0)
Hemoglobin: 12.1 g/dL (ref 12.0–15.0)
MCH: 23.1 pg — ABNORMAL LOW (ref 26.0–34.0)
MCHC: 31 g/dL (ref 30.0–36.0)
MCV: 74.4 fL — ABNORMAL LOW (ref 78.0–100.0)
Platelets: 304 10*3/uL (ref 150–400)
RBC: 5.24 MIL/uL — ABNORMAL HIGH (ref 3.87–5.11)
RDW: 16.8 % — ABNORMAL HIGH (ref 11.5–15.5)
WBC: 7.5 10*3/uL (ref 4.0–10.5)

## 2011-12-05 LAB — POCT I-STAT 3, VENOUS BLOOD GAS (G3P V)
Acid-Base Excess: 2 mmol/L (ref 0.0–2.0)
Bicarbonate: 27.9 mEq/L — ABNORMAL HIGH (ref 20.0–24.0)
O2 Saturation: 54 %
TCO2: 29 mmol/L (ref 0–100)
pCO2, Ven: 44.9 mmHg — ABNORMAL LOW (ref 45.0–50.0)
pH, Ven: 7.401 — ABNORMAL HIGH (ref 7.250–7.300)
pO2, Ven: 29 mmHg — CL (ref 30.0–45.0)

## 2011-12-05 LAB — BASIC METABOLIC PANEL
BUN: 31 mg/dL — ABNORMAL HIGH (ref 6–23)
BUN: 36 mg/dL — ABNORMAL HIGH (ref 6–23)
CO2: 29 mEq/L (ref 19–32)
CO2: 31 mEq/L (ref 19–32)
Calcium: 9.5 mg/dL (ref 8.4–10.5)
Calcium: 10.1 mg/dL (ref 8.4–10.5)
Chloride: 97 mEq/L (ref 96–112)
Chloride: 93 mEq/L — ABNORMAL LOW (ref 96–112)
Creatinine, Ser: 0.88 mg/dL (ref 0.50–1.10)
Creatinine, Ser: 1.2 mg/dL — ABNORMAL HIGH (ref 0.50–1.10)
GFR calc Af Amer: 78 mL/min — ABNORMAL LOW (ref >90)
GFR calc Af Amer: 53 mL/min — ABNORMAL LOW (ref >90)
GFR calc non Af Amer: 67 mL/min — ABNORMAL LOW (ref >90)
GFR calc non Af Amer: 46 mL/min — ABNORMAL LOW (ref >90)
Glucose, Bld: 227 mg/dL — ABNORMAL HIGH (ref 70–99)
Glucose, Bld: 379 mg/dL — ABNORMAL HIGH (ref 70–99)
Potassium: 4 mEq/L (ref 3.5–5.1)
Potassium: 4.4 mEq/L (ref 3.5–5.1)
Sodium: 135 mEq/L (ref 135–145)
Sodium: 133 mEq/L — ABNORMAL LOW (ref 135–145)

## 2011-12-05 LAB — GLUCOSE, CAPILLARY
Glucose-Capillary: 307 mg/dL — ABNORMAL HIGH (ref 70–99)
Glucose-Capillary: 334 mg/dL — ABNORMAL HIGH (ref 70–99)
Glucose-Capillary: 231 mg/dL — ABNORMAL HIGH (ref 70–99)
Glucose-Capillary: 256 mg/dL — ABNORMAL HIGH (ref 70–99)

## 2011-12-05 LAB — MRSA PCR SCREENING: MRSA by PCR: POSITIVE — AB

## 2011-12-05 LAB — RETICULOCYTES
RBC.: 5.24 MIL/uL — ABNORMAL HIGH (ref 3.87–5.11)
Retic Count, Absolute: 52.4 10*3/uL (ref 19.0–186.0)
Retic Ct Pct: 1 % (ref 0.4–3.1)

## 2011-12-05 LAB — IRON AND TIBC
Iron: 27 ug/dL — ABNORMAL LOW (ref 42–135)
Saturation Ratios: 8 % — ABNORMAL LOW (ref 20–55)
TIBC: 342 ug/dL (ref 250–470)
UIBC: 315 ug/dL (ref 125–400)

## 2011-12-05 LAB — POCT I-STAT 3, ART BLOOD GAS (G3+)
Bicarbonate: 23.6 mEq/L (ref 20.0–24.0)
O2 Saturation: 95 %
TCO2: 25 mmol/L (ref 0–100)
pCO2 arterial: 35.3 mmHg (ref 35.0–45.0)
pH, Arterial: 7.435 — ABNORMAL HIGH (ref 7.350–7.400)
pO2, Arterial: 73 mmHg — ABNORMAL LOW (ref 80.0–100.0)

## 2011-12-05 LAB — VITAMIN B12: Vitamin B-12: 382 pg/mL (ref 211–911)

## 2011-12-05 LAB — FERRITIN: Ferritin: 21 ng/mL (ref 10–291)

## 2011-12-05 LAB — FOLATE: Folate: 15.1 ng/mL

## 2011-12-05 SURGERY — LEFT AND RIGHT HEART CATHETERIZATION WITH CORONARY ANGIOGRAM
Anesthesia: LOCAL

## 2011-12-05 MED ORDER — GLIPIZIDE ER 10 MG PO TB24
20.0000 mg | ORAL_TABLET | Freq: Every day | ORAL | Status: DC
  Administered 2011-12-05 – 2011-12-07 (×3): 20 mg via ORAL
  Filled 2011-12-05 (×4): qty 2

## 2011-12-05 MED ORDER — SODIUM CHLORIDE 0.9 % IV SOLN
INTRAVENOUS | Status: AC
  Administered 2011-12-05: 15:00:00 via INTRAVENOUS

## 2011-12-05 MED ORDER — ADULT MULTIVITAMIN W/MINERALS CH
1.0000 | ORAL_TABLET | Freq: Every day | ORAL | Status: DC
  Administered 2011-12-06 – 2011-12-07 (×2): 1 via ORAL
  Filled 2011-12-05 (×2): qty 1

## 2011-12-05 MED ORDER — ACETAMINOPHEN 325 MG PO TABS: 650.0000 mg | ORAL_TABLET | ORAL | Status: DC | PRN

## 2011-12-05 MED ORDER — FENTANYL CITRATE 0.05 MG/ML IJ SOLN
INTRAMUSCULAR | Status: AC
  Filled 2011-12-05: qty 2

## 2011-12-05 MED ORDER — SODIUM CHLORIDE 0.9 % IJ SOLN
INTRAMUSCULAR | Status: AC
  Administered 2011-12-05: 10 mL
  Filled 2011-12-05: qty 3

## 2011-12-05 MED ORDER — DIAZEPAM 5 MG PO TABS: 5.0000 mg | ORAL_TABLET | ORAL | Status: DC

## 2011-12-05 MED ORDER — FUROSEMIDE 40 MG PO TABS
40.0000 mg | ORAL_TABLET | Freq: Two times a day (BID) | ORAL | Status: DC
  Administered 2011-12-05 – 2011-12-07 (×5): 40 mg via ORAL
  Filled 2011-12-05 (×11): qty 1

## 2011-12-05 MED ORDER — POTASSIUM CHLORIDE CRYS ER 20 MEQ PO TBCR
40.0000 meq | EXTENDED_RELEASE_TABLET | Freq: Every day | ORAL | Status: DC
  Administered 2011-12-06: 40 meq via ORAL
  Filled 2011-12-05: qty 2

## 2011-12-05 MED ORDER — SODIUM CHLORIDE 0.9 % IJ SOLN: 3.0000 mL | INTRAMUSCULAR | Status: DC | PRN

## 2011-12-05 MED ORDER — CHLORHEXIDINE GLUCONATE CLOTH 2 % EX PADS
6.0000 | MEDICATED_PAD | Freq: Every day | CUTANEOUS | Status: AC
  Administered 2011-12-05: 6 via TOPICAL

## 2011-12-05 MED ORDER — HEPARIN (PORCINE) IN NACL 2-0.9 UNIT/ML-% IJ SOLN
INTRAMUSCULAR | Status: AC
  Filled 2011-12-05: qty 2000

## 2011-12-05 MED ORDER — LIDOCAINE HCL (PF) 1 % IJ SOLN
INTRAMUSCULAR | Status: AC
  Filled 2011-12-05: qty 30

## 2011-12-05 MED ORDER — SODIUM CHLORIDE 0.9 % IJ SOLN
3.0000 mL | Freq: Two times a day (BID) | INTRAMUSCULAR | Status: DC
  Administered 2011-12-05 – 2011-12-06 (×2): 3 mL via INTRAVENOUS

## 2011-12-05 MED ORDER — SODIUM CHLORIDE 0.9 % IV SOLN: 250.0000 mL | INTRAVENOUS | Status: DC | PRN

## 2011-12-05 MED ORDER — MIDAZOLAM HCL 2 MG/2ML IJ SOLN
INTRAMUSCULAR | Status: AC
  Filled 2011-12-05: qty 2

## 2011-12-05 MED ORDER — ALBUTEROL SULFATE HFA 108 (90 BASE) MCG/ACT IN AERS
2.0000 | INHALATION_SPRAY | Freq: Four times a day (QID) | RESPIRATORY_TRACT | Status: DC | PRN
  Filled 2011-12-05: qty 6.7

## 2011-12-05 MED ORDER — NITROGLYCERIN 0.2 MG/ML ON CALL CATH LAB
INTRAVENOUS | Status: AC
  Filled 2011-12-05: qty 1

## 2011-12-05 NOTE — Progress Notes (Signed)
Pt was found up at sink doing personal grooming at time of my visit.  She states that she feels well and has been up, walking independently.  She lives with family members and doesn't think she will have any difficulty transitioning to home.  Will cancel PT orders.

## 2011-12-05 NOTE — Progress Notes (Signed)
SUBJECTIVE:No complaints of chest pain, shortness of breath or edema.  Some throat soreness from ET tube.  Active Problems:  DIABETES MELLITUS, TYPE II  HYPERLIPIDEMIA  Obesity  Anemia  Hypertension  Aortic stenosis  Cardiomyopathy  Respiratory distress  Acute respiratory failure   LABS: Basic Metabolic Panel:  Basename 12/05/11 0440 12/04/11 0755  NA 135 137  K 4.0 3.8  CL 97 96  CO2 29 32  GLUCOSE 227* 268*  BUN 31* 26*  CREATININE 0.88 0.82  CALCIUM 9.5 10.3  MG -- --  PHOS -- --   CBC:  Basename 12/05/11 0440 12/04/11 0755  WBC 7.5 10.9*  NEUTROABS -- --  HGB 12.1 12.8  HCT 39.0 39.8  MCV 74.4* 75.1*  PLT 304 332   Cardiac Enzymes:  Basename 12/03/11 2138 12/03/11 1450 12/03/11 0709  CKTOTAL 162 167 222*  CKMB 2.1 2.2 2.5  CKMBINDEX -- -- --  TROPONINI <0.30 <0.30 <0.30   RADIOLOGY: CXR 12/03/2011 Findings: Endotracheal tube has been removed. Bilateral airspace disease shows marked improvement. There is some fluid in the minor fissure. Cardiomegaly noted.  IMPRESSION: 1. Status post extubation. 2. Marked improvement bilateral in airspace disease.  Echocardiogram: 11/07/2011 Left ventricle: The cavity size was at the upper limits of normal. Wall thickness was increased in a pattern of mild LVH. Systolic function was severely reduced. The estimated ejection fraction was in the range of 20% to 25%. There is akinesis of the anteroseptal and apical myocardium. There is akinesis of the inferoseptal myocardium. Doppler parameters are consistent with abnormal left ventricular relaxation (grade 1 diastolic dysfunction). - Aortic valve: Mildly calcified annulus. Probably trileaflet; moderately calcified leaflets. Cusp separation was reduced. There was mild to moderate stenosis - could be underestimated although valve motion does not appear severely reduced. No significant regurgitation. Mean gradient: 39mm Hg (S). VTI ratio of LVOT to aortic  valve: 0.27. - Mitral valve: Calcified annulus. Mild regurgitation directed eccentrically. - Left atrium: The atrium was mildly dilated. - Tricuspid valve: Mild regurgitation. - Pulmonary arteries: Systolic pressure was moderately increased. PA peak pressure: 54mm Hg (S). - Pericardium, extracardiac: There was no pericardial effusion. Transthoracic echocardiography. M-mode, complete 2D, spectral Doppler, and color Doppler. Height: Height: 157.5cm. Height: 62in. Weight: Weight: 83.5kg. Weight: 183.6lb. Body mass index: BMI: 33.7kg/m^2.    PHYSICAL EXAM BP 96/67  Pulse 75  Temp(Src) 98.3 F (36.8 C) (Oral)  Resp 19  Ht 5\' 7"  (1.702 m)  Wt 175 lb 1.6 oz (79.425 kg)  BMI 27.42 kg/m2  SpO2 96% General: Well developed, well nourished, in no acute distress Head: Eyes PERRLA, No xanthomas.   Normal cephalic and atramatic  Lungs: Clear bilaterally to auscultation and percussion. Heart: HRRR S1 S2, No MRG .  Pulses are 2+ & equal.            No carotid bruit. No JVD.  No abdominal bruits. No femoral bruits. Abdomen: Bowel sounds are positive, abdomen soft and non-tender without masses or                  Hernia's noted. Msk:  Back normal, normal gait. Normal strength and tone for age. Extremities: No clubbing, cyanosis or edema.  DP +1 Neuro: Alert and oriented X 3. Psych:  Good affect, responds appropriately  TELEMETRY: Reviewed telemetry pt in: NSR rate 75 bpm.  ASSESSMENT AND PLAN:  1. Acute on chronic systolic CHF: She has an ejection fraction of 20-25% akinesis of the anteroseptal and apical myocardium and akinesis of the  inferoseptal myocardium with mild to moderate AoV stenosis - could be underestimated although valve motion does not appear severely reduced. No significant regurgitation. Mean gradient: 61mm Hg (S). VTI ratio of LVOT to aortic valve: 0.27. She presented with acute pulmonary edema and respiratory distress requiring intubation. She was intubated for 3 days and  extubated after diureses of 25 lbs. She had a stress test on 11/17/2011 revealing apical scarring without evidence of ischemia. EKG showed evidence of prior MI.   She is now stable and breathing without distress. She will be sent to [facility] hospital for cardiac cath with possible intervention necessary in the setting of severely reduced EF.  She continues on ACE inhibitor, BB, IV lasix with Creatinine of 0.88.  Will change lasix to p.o. Cardiac cath, risks and benefits have been discussed with the patient who verbalizes in understanding but is willing to proceed although she  is anxious about it. Arrangements will be made to transport her to [facility] this am.    Phill Myron. Purcell Nails NP [facility] 12/05/2011, 9:22 AM

## 2011-12-05 NOTE — H&P (View-Only) (Signed)
SUBJECTIVE:No complaints of chest pain, shortness of breath or edema.  Some throat soreness from ET tube.  Active Problems:  DIABETES MELLITUS, TYPE II  HYPERLIPIDEMIA  Obesity  Anemia  Hypertension  Aortic stenosis  Cardiomyopathy  Respiratory distress  Acute respiratory failure   LABS: Basic Metabolic Panel:  Basename 12/05/11 0440 12/04/11 0755  NA 135 137  K 4.0 3.8  CL 97 96  CO2 29 32  GLUCOSE 227* 268*  BUN 31* 26*  CREATININE 0.88 0.82  CALCIUM 9.5 10.3  MG -- --  PHOS -- --   CBC:  Basename 12/05/11 0440 12/04/11 0755  WBC 7.5 10.9*  NEUTROABS -- --  HGB 12.1 12.8  HCT 39.0 39.8  MCV 74.4* 75.1*  PLT 304 332   Cardiac Enzymes:  Basename 12/03/11 2138 12/03/11 1450 12/03/11 0709  CKTOTAL 162 167 222*  CKMB 2.1 2.2 2.5  CKMBINDEX -- -- --  TROPONINI <0.30 <0.30 <0.30   RADIOLOGY: CXR 12/03/2011 Findings: Endotracheal tube has been removed. Bilateral airspace disease shows marked improvement. There is some fluid in the minor fissure. Cardiomegaly noted.  IMPRESSION: 1. Status post extubation. 2. Marked improvement bilateral in airspace disease.  Echocardiogram: 11/07/2011 Left ventricle: The cavity size was at the upper limits of normal. Wall thickness was increased in a pattern of mild LVH. Systolic function was severely reduced. The estimated ejection fraction was in the range of 20% to 25%. There is akinesis of the anteroseptal and apical myocardium. There is akinesis of the inferoseptal myocardium. Doppler parameters are consistent with abnormal left ventricular relaxation (grade 1 diastolic dysfunction). - Aortic valve: Mildly calcified annulus. Probably trileaflet; moderately calcified leaflets. Cusp separation was reduced. There was mild to moderate stenosis - could be underestimated although valve motion does not appear severely reduced. No significant regurgitation. Mean gradient: 87mm Hg (S). VTI ratio of LVOT to aortic  valve: 0.27. - Mitral valve: Calcified annulus. Mild regurgitation directed eccentrically. - Left atrium: The atrium was mildly dilated. - Tricuspid valve: Mild regurgitation. - Pulmonary arteries: Systolic pressure was moderately increased. PA peak pressure: 19mm Hg (S). - Pericardium, extracardiac: There was no pericardial effusion. Transthoracic echocardiography. M-mode, complete 2D, spectral Doppler, and color Doppler. Height: Height: 157.5cm. Height: 62in. Weight: Weight: 83.5kg. Weight: 183.6lb. Body mass index: BMI: 33.7kg/m^2.    PHYSICAL EXAM BP 96/67  Pulse 75  Temp(Src) 98.3 F (36.8 C) (Oral)  Resp 19  Ht 5\' 7"  (1.702 m)  Wt 175 lb 1.6 oz (79.425 kg)  BMI 27.42 kg/m2  SpO2 96% General: Well developed, well nourished, in no acute distress Head: Eyes PERRLA, No xanthomas.   Normal cephalic and atramatic  Lungs: Clear bilaterally to auscultation and percussion. Heart: HRRR S1 S2, No MRG .  Pulses are 2+ & equal.            No carotid bruit. No JVD.  No abdominal bruits. No femoral bruits. Abdomen: Bowel sounds are positive, abdomen soft and non-tender without masses or                  Hernia's noted. Msk:  Back normal, normal gait. Normal strength and tone for age. Extremities: No clubbing, cyanosis or edema.  DP +1 Neuro: Alert and oriented X 3. Psych:  Good affect, responds appropriately  TELEMETRY: Reviewed telemetry pt in: NSR rate 75 bpm.  ASSESSMENT AND PLAN:  1. Acute on chronic systolic CHF: She has an ejection fraction of 20-25% akinesis of the anteroseptal and apical myocardium and akinesis of the  inferoseptal myocardium with mild to moderate AoV stenosis - could be underestimated although valve motion does not appear severely reduced. No significant regurgitation. Mean gradient: 93mm Hg (S). VTI ratio of LVOT to aortic valve: 0.27. She presented with acute pulmonary edema and respiratory distress requiring intubation. She was intubated for 3 days and  extubated after diureses of 25 lbs. She had a stress test on 11/17/2011 revealing apical scarring without evidence of ischemia. EKG showed evidence of prior MI.   She is now stable and breathing without distress. She will be sent to [facility] hospital for cardiac cath with possible intervention necessary in the setting of severely reduced EF.  She continues on ACE inhibitor, BB, IV lasix with Creatinine of 0.88.  Will change lasix to p.o. Cardiac cath, risks and benefits have been discussed with the patient who verbalizes in understanding but is willing to proceed although she  is anxious about it. Arrangements will be made to transport her to [facility] this am.    Phill Myron. Purcell Nails NP [facility] 12/05/2011, 9:22 AM

## 2011-12-05 NOTE — Interval H&P Note (Signed)
History and Physical Interval Note:  12/05/2011 12:13 PM  [name] [name]  has presented today for right and left heart cath with the diagnosis of chest pain  The various methods of treatment have been discussed with the patient and family. After consideration of risks, benefits and other options for treatment, the patient has consented to  Procedure(s) (LRB): LEFT AND RIGHT HEART CATHETERIZATION WITH CORONARY ANGIOGRAM (N/A) as a surgical intervention .  The patients' history has been reviewed, patient examined, no change in status, stable for surgery.  I have reviewed the patients' chart and labs.  Questions were answered to the patient's satisfaction.     Fount Bahe

## 2011-12-05 NOTE — Progress Notes (Signed)
   CARE MANAGEMENT NOTE 12/05/2011  Patient:  [name] [name], [name] [name]   Account Number:  [id number]  Date Initiated:  12/05/2011  Documentation initiated by:  Claretha Cooper  Subjective/Objective Assessment:   Pt admitted in respiratory distress. Also had CHF and PNA. Placed on vent and began extubating on Friday, 12/02/11.     Action/Plan:   Today pt was transfer to [facility] for further heart workup.   Anticipated DC Date:  12/08/2011   Anticipated DC Plan:  [place]  CM consult      Choice offered to / List presented to:             Status of service:  Completed, signed off Medicare Important Message given?   (If response is "NO", the following Medicare IM given date fields will be blank) Date Medicare IM given:   Date Additional Medicare IM given:    Discharge Disposition:  ACUTE TO ACUTE TRANS  Per UR Regulation:    If discussed at Long Length of Stay Meetings, dates discussed:    Comments:  12/05/11 [place]

## 2011-12-05 NOTE — CV Procedure (Signed)
    Cardiac Catheterization Operative Report  [name] [name] [id number] 4/22/201312:53 PM [name], MD, MD  Procedure Performed:  1. Left Heart Catheterization 2. Selective Coronary Angiography 3. Right Heart Catheterization 4. Left ventricular angiogram  Operator: Lauree Chandler, MD  Indication: Cardiomyopathy. Admitted to [facility] with CHF. She is known to have global LV systolic dysfunction with LVEF of 20-25% by echo March 2013. She has been diuresed and doing well. Transferred here for cath.                                 Procedure Details: The risks, benefits, complications, treatment options, and expected outcomes were discussed with the patient. The patient and/or family concurred with the proposed plan, giving informed consent. The patient was brought to the cath lab after IV hydration was begun and oral premedication was given. The patient was further sedated with Versed and Fentanyl. The right groin was prepped and draped in the usual manner. Using the modified Seldinger access technique, a 5 French sheath was placed in the right femoral artery. A 6 French sheath was inserted into the right femoral vein. A balloon tipped catheter was used to perform a right heart catheterization. Standard diagnostic catheters were used to perform selective coronary angiography. A pigtail catheter was used to perform a left ventricular angiogram. There were no immediate complications. The patient was taken to the recovery area in stable condition.   Hemodynamic Findings: Ao:  103/73                LV:  116/4/6 RA:  6                RV:  37/6/7 PA:   41/12 (mean 20)      PCWP:  8 Fick Cardiac Output:  3.86 L/min Fick Cardiac Index:  2.02 L/min/m2 Central Aortic Saturation: 95% Pulmonary Artery Saturation: 55%   Angiographic Findings:  Left main: No evidence of disease.   Left Anterior Descending Artery: Large caliber vessel that courses to the apex. Large caliber diagonal  branch. No evidence of disease.   Circumflex Artery: Large caliber vessel with large Obtuse marginal branch. No evidence of disease.   Right Coronary Artery:  Moderate sized dominant vessel. No evidence of disease.   Left Ventricular Angiogram: LVEF 20-25%.    Impression: 1. No angiographic evidence of CAD 2. 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 Normal filling pressures.   Recommendations: Will continue medical management. She may be ready for d/c in the am. She will need consideration for an ICD but will need 3 months of optimal medical therapy before this can be considered.        Complications:  None; patient tolerated the procedure well.

## 2011-12-06 LAB — GLUCOSE, CAPILLARY
Glucose-Capillary: 251 mg/dL — ABNORMAL HIGH (ref 70–99)
Glucose-Capillary: 390 mg/dL — ABNORMAL HIGH (ref 70–99)
Glucose-Capillary: 119 mg/dL — ABNORMAL HIGH (ref 70–99)
Glucose-Capillary: 240 mg/dL — ABNORMAL HIGH (ref 70–99)

## 2011-12-06 LAB — CULTURE, BLOOD (ROUTINE X 2)
Culture: NO GROWTH
Culture: NO GROWTH

## 2011-12-06 MED ORDER — LISINOPRIL 2.5 MG PO TABS
2.5000 mg | ORAL_TABLET | Freq: Two times a day (BID) | ORAL | Status: DC
  Filled 2011-12-06 (×2): qty 1

## 2011-12-06 NOTE — Progress Notes (Signed)
CARDIAC REHAB PHASE I   PRE:  Rate/Rhythm: 108 ST    BP: sitting right arm 79/62, left arm 99/66, standing left arm 108/68    SaO2:   MODE:  Ambulation: 340 ft   POST:  Rate/Rhythm: 120 ST    BP: sitting 89/47     SaO2:   Pt BP in right arm low. Denied sx. Left arm better although BP lower after walk. See above. Pt denied dizziness or SOB. Sts she is just tired/fatigued in general. To recliner after walk. HR up walking to 120 ST.  Discussed HF, daily wts and low sodium diet.  [id number] Darrick Meigs CES, ACSM

## 2011-12-06 NOTE — Progress Notes (Signed)
Inpatient Diabetes Program Recommendations  AACE/ADA: New Consensus Statement on Inpatient Glycemic Control  Target Ranges:  Prepandial:   less than 140 mg/dL      Peak postprandial:   less than 180 mg/dL (1-2 hours)      Critically ill patients:  140 - 180 mg/dL  Pager:  [phone] Hours:  8 am-10pm   Reason for Visit: Elevated glucose:  251 mg/dL  Inpatient Diabetes Program Recommendations Insulin - Basal: Increase Lantus to 25 units daily

## 2011-12-06 NOTE — Progress Notes (Signed)
SUBJECTIVE: NO chest pain or SOB. No events.   BP 109/73  Pulse 87  Temp(Src) 97.9 F (36.6 C) (Oral)  Resp 18  Ht 5\' 7"  (1.702 m)  Wt 175 lb 0.7 oz (79.4 kg)  BMI 27.42 kg/m2  SpO2 96%  Intake/Output Summary (Last 24 hours) at 12/06/11 [id number] Last data filed at 12/05/11 2300  Gross per 24 hour  Intake    600 ml  Output    250 ml  Net    350 ml    PHYSICAL EXAM General: Well developed, well nourished, in no acute distress. Alert and oriented x 3.  Psych:  Good affect, responds appropriately Neck: No JVD. No masses noted.  Lungs: Clear bilaterally with no wheezes or rhonci noted.  Heart: RRR with no murmurs noted. Abdomen: Bowel sounds are present. Soft, non-tender.  Extremities: No lower extremity edema. Right groin cath site ok.   LABS: Basic Metabolic Panel:  Basename 12/05/11 1028 12/05/11 0440  NA 133* 135  K 4.4 4.0  CL 93* 97  CO2 31 29  GLUCOSE 379* 227*  BUN 36* 31*  CREATININE 1.20* 0.88  CALCIUM 10.1 9.5  MG -- --  PHOS -- --   CBC:  Basename 12/05/11 0440 12/04/11 0755  WBC 7.5 10.9*  NEUTROABS -- --  HGB 12.1 12.8  HCT 39.0 39.8  MCV 74.4* 75.1*  PLT 304 332   Cardiac Enzymes:  Basename 12/03/11 2138 12/03/11 1450 12/03/11 0709  CKTOTAL 162 167 222*  CKMB 2.1 2.2 2.5  CKMBINDEX -- -- --  TROPONINI <0.30 <0.30 <0.30    Current Meds:    . albuterol-ipratropium  4 puff Inhalation QID  . antiseptic oral rinse  15 mL Mouth Rinse QID  . carvedilol  6.25 mg Oral BID WC  . chlorhexidine  15 mL Mouth/Throat BID  . Chlorhexidine Gluconate Cloth  6 each Topical Q0600  . fentaNYL      . furosemide  40 mg Oral BID  . glipiZIDE  20 mg Oral Daily  . heparin      . insulin aspart  0-15 Units Subcutaneous TID WC  . insulin aspart  0-5 Units Subcutaneous QHS  . insulin glargine  15 Units Subcutaneous Daily  . levofloxacin (LEVAQUIN) IV  750 mg Intravenous Q24H  . lidocaine      . lisinopril  2.5 mg Oral BID  . midazolam      .  mulitivitamin with minerals  1 tablet Oral Daily  . mupirocin ointment  1 application Nasal BID  . nitroGLYCERIN      . potassium chloride  40 mEq Oral Daily  . sodium chloride  3 mL Intravenous Q12H  . spironolactone  12.5 mg Oral Daily  . DISCONTD: diazepam  5 mg Oral On Call  . DISCONTD: enoxaparin  40 mg Subcutaneous Q24H  . DISCONTD: famotidine (PEPCID) IV  20 mg Intravenous Q12H  . DISCONTD: furosemide  40 mg Intravenous Q12H  . DISCONTD: potassium chloride  40 mEq Oral BID     ASSESSMENT AND PLAN: Pt transferred from [facility] yesterday for cardiac cath. She was admitted there with SOB/pulmonary edema secondary to acute on chronic systolic CHF. She was intubated for three days there. She was diuresed and sent here for cath. Of note, her EF was found to be reduced in march 2013 and outpatient workup included stress myoview on 11/17/11 which revealed apical scarring without evidence of ischemia.  1. Acute on chronic systolic CHF: She has an  ejection fraction of 20-25% secondary to NICM.  Her volume status is much better after diuresis of 25 pounds at [facility]. She is now on Lasix po. Cardiac cath with no evidence of CAD.   2. Non-ischemic cardiomyopathy: Cardiac cath yesterday with no evidence of CAD. Will continue medical management of her NICM with Ace inh, beta blocker and Lasix. She will need consideration for an ICD after 3 months of optimal medical therapy. She can see EP as an outpatient if her LVEF is still below 35% in 2 months.   3. Aortic stenosis:  mild to moderate AoV stenosis.  4. DM: Continue current therapy. SSI  5. Renal: Mild bump in creatinine this am. Will repeat BMET in am.   6. Dispo: Will transfer to telemetry today. I think she is deconditioned after 3 days on the vent and will need one more day of hospitalization. Will ask cardiac rehab to see her today. Possible d/c in am.        Karita Dralle  4/23/20137:05 AM

## 2011-12-07 DIAGNOSIS — I5023 Acute on chronic systolic (congestive) heart failure: Principal | ICD-10-CM

## 2011-12-07 LAB — CBC
HCT: 35.1 % — ABNORMAL LOW (ref 36.0–46.0)
Hemoglobin: 11.9 g/dL — ABNORMAL LOW (ref 12.0–15.0)
MCH: 24.5 pg — ABNORMAL LOW (ref 26.0–34.0)
MCHC: 33.9 g/dL (ref 30.0–36.0)
MCV: 72.4 fL — ABNORMAL LOW (ref 78.0–100.0)
Platelets: 265 10*3/uL (ref 150–400)
RBC: 4.85 MIL/uL (ref 3.87–5.11)
RDW: 16.6 % — ABNORMAL HIGH (ref 11.5–15.5)
WBC: 8.1 10*3/uL (ref 4.0–10.5)

## 2011-12-07 LAB — BASIC METABOLIC PANEL
BUN: 44 mg/dL — ABNORMAL HIGH (ref 6–23)
CO2: 26 mEq/L (ref 19–32)
Calcium: 9.5 mg/dL (ref 8.4–10.5)
Chloride: 96 mEq/L (ref 96–112)
Creatinine, Ser: 1.3 mg/dL — ABNORMAL HIGH (ref 0.50–1.10)
GFR calc Af Amer: 48 mL/min — ABNORMAL LOW (ref >90)
GFR calc non Af Amer: 42 mL/min — ABNORMAL LOW (ref >90)
Glucose, Bld: 230 mg/dL — ABNORMAL HIGH (ref 70–99)
Potassium: 4.3 mEq/L (ref 3.5–5.1)
Sodium: 132 mEq/L — ABNORMAL LOW (ref 135–145)

## 2011-12-07 LAB — GLUCOSE, CAPILLARY
Glucose-Capillary: 210 mg/dL — ABNORMAL HIGH (ref 70–99)
Glucose-Capillary: 310 mg/dL — ABNORMAL HIGH (ref 70–99)

## 2011-12-07 MED ORDER — LISINOPRIL 2.5 MG PO TABS: 2.5000 mg | ORAL_TABLET | Freq: Every day | ORAL | Status: DC

## 2011-12-07 MED ORDER — SITAGLIPTIN PHOS-METFORMIN HCL 50-1000 MG PO TABS: 1.0000 | ORAL_TABLET | Freq: Two times a day (BID) | ORAL | Status: DC

## 2011-12-07 MED ORDER — FUROSEMIDE 40 MG PO TABS: 40.0000 mg | ORAL_TABLET | Freq: Every day | ORAL | Status: DC

## 2011-12-07 MED ORDER — CARVEDILOL 3.125 MG PO TABS: 3.1250 mg | ORAL_TABLET | Freq: Two times a day (BID) | ORAL | Status: DC

## 2011-12-07 MED ORDER — SPIRONOLACTONE 25 MG PO TABS: 12.5000 mg | ORAL_TABLET | Freq: Every day | ORAL | Status: DC

## 2011-12-07 MED ORDER — ALBUTEROL SULFATE HFA 108 (90 BASE) MCG/ACT IN AERS: 2.0000 | INHALATION_SPRAY | RESPIRATORY_TRACT | Status: DC | PRN

## 2011-12-07 MED ORDER — IPRATROPIUM-ALBUTEROL 18-103 MCG/ACT IN AERO
4.0000 | INHALATION_SPRAY | Freq: Two times a day (BID) | RESPIRATORY_TRACT | Status: DC
  Administered 2011-12-07: 4 via RESPIRATORY_TRACT

## 2011-12-07 NOTE — Progress Notes (Signed)
Patient Name: [name] [name] Date of Encounter: 12/07/2011  Active Problems:  DIABETES MELLITUS, TYPE II  HYPERLIPIDEMIA  Obesity  Anemia  Hypertension  Aortic stenosis  Cardiomyopathy  Respiratory distress  Acute respiratory failure    SUBJECTIVE: No CP, DOE is a little better. She is weak, has a husband and grandchildren at home with her. She is agreeable to [facility] and will be able to get Rx filled. Her son is getting her a scale. She is willing to weigh daily.  OBJECTIVE Filed Vitals:   12/06/11 1254 12/06/11 1936 12/06/11 2151 12/07/11 0633  BP: 99/69  94/60 94/59  Pulse: 95  86 88  Temp: 97.8 F (36.6 C)  99.2 F (37.3 C) 99.1 F (37.3 C)  TempSrc: Oral  Oral Oral  Resp: 18  18 16   Height:      Weight:    175 lb 4.3 oz (79.5 kg)  SpO2: 95% 96% 96% 99%    Intake/Output Summary (Last 24 hours) at 12/07/11 0700 Last data filed at 12/06/11 2100  Gross per 24 hour  Intake    960 ml  Output      0 ml  Net    960 ml   Weight change: 3.5 oz (0.1 kg) Filed Weights   12/05/11 0514 12/06/11 0445 12/07/11 [id number]  Weight: 175 lb 1.6 oz (79.425 kg) 175 lb 0.7 oz (79.4 kg) 175 lb 4.3 oz (79.5 kg)     PHYSICAL EXAM General: Well developed, well nourished, female in no acute distress. Head: Normocephalic, atraumatic.  Neck: Supple without bruits, JVD at about 6 cm. Lungs:  Resp regular and unlabored, CTA except a few rales bases. Heart: RRR, S1, S2, no S3, S4, + murmur. Abdomen: Soft, non-tender, non-distended, BS + x 4.  Extremities: No clubbing, cyanosis, no edema.  Neuro: Alert and oriented X 3. Moves all extremities spontaneously. Psych: seems a little depressed - ?about the death of her daughter and caring for her grandchildren since then  LABS: CBC: Basename 12/07/11 0448 12/05/11 0440  WBC 8.1 7.5  NEUTROABS -- --  HGB 11.9* 12.1  HCT 35.1* 39.0  MCV 72.4* 74.4*  PLT 265 123456   Basic Metabolic Panel: Basename [id number] 0448 12/05/11 1028  NA 132* 133*    K 4.3 4.4  CL 96 93*  CO2 26 31  GLUCOSE 230* 379*  BUN 44* 36*  CREATININE 1.30* 1.20*  CALCIUM 9.5 10.1  MG -- --  PHOS -- --   BNP: Pro B Natriuretic peptide (BNP)  Date/Time Value Range Status  12/01/2011  5:11 AM 1086.0* 0-125 (pg/mL) Final  11/30/2011 11:17 PM 703.0* 0-125 (pg/mL) Final   Anemia Panel: Basename 12/05/11 0440  VITAMINB12 382  FOLATE 15.1  FERRITIN 21  TIBC 342  IRON 27*  RETICCTPCT 1.0    TELE:   SR  Radiology/Studies: Dg Chest 1 View 12/03/2011  *RADIOLOGY REPORT*  Clinical Data: Respiratory failure.  CHEST - 1 VIEW  Comparison: Plain film chest 11/30/2011.  Findings: Endotracheal tube has been removed.  Bilateral airspace disease shows marked improvement.  There is some fluid in the minor fissure.  Cardiomegaly noted.  IMPRESSION:  1.  Status post extubation. 2.  Marked improvement bilateral in airspace disease.  Original Report Authenticated By: Arvid Right. Luther Parody, M.D.   Nm Myocar Single W/spect W/wall Motion And Ef 11/30/2011  nm myoview pharmacologic stress  Ordering Physician: Jacqulyn Ducking  Reading Physician: Jacqulyn Ducking  Clinical Data: 66 year old woman with left ventricular dysfunction,  possibly as the result of ischemic heart disease.  NUCLEAR MEDICINE ADENOSINE STRESS MYOVIEW STUDY WITH SPECT AND LEFT VENTRIUCLAR EJECTION FRACTION  Radionuclide Data: One-day rest/stress protocol performed with 10/30 mCi of Tc-45m Myoview.  Stress Data: Regadenoson infusion resulted in dyspnea.  There was a typical increase in heart rate but no change in blood pressure following drug administration.  No arrhythmias noted.  EKG: Normal sinus rhythm; left atrial abnormality; IVCD. No significant change following Regadenoson.  Scintigraphic Data: Acquisition notable for minimal breast attenuation but mild to moderate diaphragmatic attenuation and moderately intense GI activity adjacent to the inferior wall. There was moderate left ventricular dilatation.  On  tomographic images reconstructed in standard planes, there was a small to moderate-sized defect of moderate to severe intensity involving the anteroseptal segment and also involving the distal anterior wall and apex.  By comparison to the resting portion of the study, no reversibility was apparent.  The gated reconstruction demonstrated moderately impaired overall left ventricular systolic function with an estimated ejection fraction of 31%. There was akinesis to mild dyskinesis or perhaps paradoxical motion of the septum as well as virtual akinesis of the inferior and inferoseptal segments. Systolic accentuation of activity is absent in the septum and markedly decreased in the anteroapical segment.  IMPRESSION: Abnormal pharmacologic stress nuclear myocardial study revealing a nondiagnostic EKG due to the  presence of an intraventricular conduction delay at baseline, substantial left ventricular enlargement with moderately impaired left ventricular systolic function in a segmental pattern, and anteroseptal and apical myocardial scarring without apparent ischemia.  Other findings as noted.  Original Report Authenticated By: [id number]   Dg Chest Portable 1 View 11/30/2011  *RADIOLOGY REPORT*  Clinical Data: Respiratory distress; endotracheal tube placement.  PORTABLE CHEST - 1 VIEW  Comparison: Chest radiograph performed 10/28/2011  Findings: The patient's endotracheal tube is seen ending 2 cm above the carina.  There is diffuse bilateral airspace opacification, with mild sparing at the lung apices. The appearance is suspicious for diffuse pneumonia, though underlying edema may be present.  No pleural effusion or pneumothorax is seen.  The cardiomediastinal silhouette is mildly enlarged.  Vascular congestion is noted.  No acute osseous abnormalities are identified.  The stomach is mildly distended with air.  IMPRESSION:  1.  Endotracheal tube seen ending 2 cm above the carina. 2.  Diffuse bilateral airspace  opacification, suspicious for diffuse pneumonia, though underlying interstitial edema may be present. 3.  Mild cardiomegaly and underlying vascular congestion seen.  Original Report Authenticated By: Santa Lighter, M.D.    Current Medications:  . albuterol-ipratropium  4 puff Inhalation BID  . antiseptic oral rinse  15 mL Mouth Rinse QID  . carvedilol  6.25 mg Oral BID WC  . chlorhexidine  15 mL Mouth/Throat BID  . furosemide  40 mg Oral BID  . glipiZIDE  20 mg Oral Daily  . insulin aspart  0-15 Units Subcutaneous TID WC  . insulin aspart  0-5 Units Subcutaneous QHS  . insulin glargine  15 Units Subcutaneous Daily  . levofloxacin (LEVAQUIN) IV  750 mg Intravenous Q24H  . lisinopril  2.5 mg Oral BID  . mulitivitamin with minerals  1 tablet Oral Daily  . spironolactone  12.5 mg Oral Daily      ASSESSMENT AND PLAN: Active Problems:  DIABETES MELLITUS, TYPE II  HYPERLIPIDEMIA  Obesity  Anemia  Hypertension  Aortic stenosis  Cardiomyopathy  Respiratory distress  Acute respiratory failure  ASSESSMENT AND PLAN: Pt transferred from [facility] 4/22 for cardiac cath.  She was admitted there with SOB/pulmonary edema secondary to acute on chronic systolic CHF. She was intubated for three days there. She was diuresed and sent here for cath. Of note, her EF was found to be reduced in march 2013 and outpatient workup included stress myoview on 11/17/11 which revealed apical scarring without evidence of ischemia.  1. Acute on chronic systolic CHF: She has an ejection fraction of 20-25% secondary to NICM. Her volume status is much better after diuresis of 25 pounds at [facility]. She is now on Lasix po. Cardiac cath with no evidence of CAD.  2. Non-ischemic cardiomyopathy: Cardiac cath with no evidence of CAD. Will continue medical management of her NICM with Ace inh, beta blocker and Lasix. She will need consideration for an ICD after 3 months of optimal medical therapy. She can see EP as an outpatient if her LVEF  is still below 35% in 2 months.  3. Aortic stenosis: mild to moderate AoV stenosis.  4. DM: Continue current therapy. Will have her restart Janumet at home tomorrow (holding post cath) 5. Renal: Stable. 6. Dispo: Will d/c home today.  She was  deconditioned after 3 days on the vent and was weak when cardiac rehab ambulated her but is doing better today. Signed, Rosaria Ferries , PA-C 7:00 AM 12/07/2011  .I have personally seen and examined this patient with Rosaria Ferries, PA-C. I agree with the assessment and plan as outlined above. The patient is doing much better. Will d/c home today and arrange [facility] and f/u with [name] in 2 weeks. Will lower Lasix to 40 mg po Qdaily and will decrease Lisinopril to 2.5 mg po Qdaily.   Jesslyn Viglione 7:20 AM 12/07/2011

## 2011-12-07 NOTE — Discharge Summary (Signed)
See full note this am. cdm 

## 2011-12-07 NOTE — Progress Notes (Signed)
CARDIAC REHAB PHASE I   PRE:  Rate/Rhythm: 93 SR    BP: sitting 100/70    SaO2: 95 RA  MODE:  Ambulation: 550 ft   POST:  Rate/Rhythm: 95    BP: sitting 96/64     SaO2: 95 RA  Tolerated well. No c/o. BP low. [id number]  Darrick Meigs CES, ACSM

## 2011-12-07 NOTE — Discharge Summary (Signed)
CARDIOLOGY DISCHARGE SUMMARY   Patient ID: CANDIE BUSAM MRN: [id number] DOB/AGE: Feb 27, 1946 66 y.o.  Admit date: 12/05/2011 Discharge date: 12/07/2011  Primary Discharge Diagnosis:  Acute on chronic systolic congestive heart failure Secondary Discharge Diagnosis:  Past Medical History  Diagnosis Date  . 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 Hyperlipidemia   . Anxiety   . Aortic stenosis 2009    Echocardiogram 2009-normal EF, mild AS; negative stress nuclear in 2004; negative carotid duplex study in 2009; echo 10/2011-mild LVH, AS-IS-apical akinesis with EF of 20-25%,  mild to mod. AS  . Pneumonia 10/2011    Hospitalized  . Palpitations     freq. PVCs  . Cardiomyopathy 10/2011    EF of 20-25% on echo in 10/2011  . Systolic dysfunction 123456    severe  . Heart murmur   . Shortness of breath   . Diabetes mellitus, type 2     insulin dependent  . Arthritis     in knee    Procedures: 1.  Left Heart Catheterization 2. Selective Coronary Angiography 3. Right Heart Catheterization 4. Left ventricular angiogram   Hospital Course: Ms. Dianah Field is a 66 year old female with a recent diagnosis of left ventricular dysfunction. Her EF was 25% by echo. On 12/01/2011, she went to [facility] with shortness of breath and was admitted with congestive heart failure. She required intubation. She was diuresed there and her weight decreased by approximately 25 pounds. Her shortness of breath improved. Once she was extubated and was stabilized, she was transferred to [facility], for catheterization.  A cardiac catheterization results are listed below. She was felt to have a nonischemic cardiomyopathy and medical therapy was recommended. She was felt to be deconditioned from her prolonged hospital stay and she was seen by cardiac rehabilitation. At first, she ambulated poorly but this gradually improved. She had Lasix added to her medication regimen as  well as Aldactone. She had been on carvedilol and an ACE inhibitor prior to admission but doses were decreased because her systolic blood pressure was routinely 90-100.  On 12/06/2011, Ms. Trivedi was seen by cardiac rehabilitation and by Dr. Merlene Pulling. She was agreeable to having a home health RN and stated she would obtain a scale for daily weights. Ms. Sgarlata was considered stable for discharge, in improved condition, to followup as an outpatient.    Labs:   Lab Results  Component Value Date   WBC 8.1 12/07/2011   HGB 11.9* 12/07/2011   HCT 35.1* 12/07/2011   MCV 72.4* 12/07/2011   PLT 265 12/07/2011    Lab 12/07/11 0448  NA 132*  K 4.3  CL 96  CO2 26  BUN 44*  CREATININE 1.30*  CALCIUM 9.5  PROT --  BILITOT --  ALKPHOS --  ALT --  AST --  GLUCOSE 230*   Radiology: Dg Chest 1 View 12/03/2011  *RADIOLOGY REPORT*  Clinical Data: Respiratory failure.  CHEST - 1 VIEW  Comparison: Plain film chest 11/30/2011.  Findings: Endotracheal tube has been removed.  Bilateral airspace disease shows marked improvement.  There is some fluid in the minor fissure.  Cardiomegaly noted.  IMPRESSION:  1.  Status post extubation. 2.  Marked improvement bilateral in airspace disease.  Original Report Authenticated By: Arvid Right. Luther Parody, M.D.   Nm Myocar Single W/spect W/wall Motion And Ef 11/30/2011  nm myoview pharmacologic stress  Ordering Physician: Jacqulyn Ducking  Reading Physician: Jacqulyn Ducking  Clinical Data: 66 year old woman with left ventricular dysfunction, possibly as the result of ischemic heart disease.  NUCLEAR MEDICINE ADENOSINE STRESS MYOVIEW STUDY WITH SPECT AND LEFT VENTRIUCLAR EJECTION FRACTION  Radionuclide Data: One-day rest/stress protocol performed with 10/30 mCi of Tc-82m Myoview.  Stress Data: Regadenoson infusion resulted in dyspnea.  There was a typical increase in heart rate but no change in blood pressure following drug administration.  No arrhythmias noted.  EKG:  Normal sinus rhythm; left atrial abnormality; IVCD. No significant change following Regadenoson.  Scintigraphic Data: Acquisition notable for minimal breast attenuation but mild to moderate diaphragmatic attenuation and moderately intense GI activity adjacent to the inferior wall. There was moderate left ventricular dilatation.  On tomographic images reconstructed in standard planes, there was a small to moderate-sized defect of moderate to severe intensity involving the anteroseptal segment and also involving the distal anterior wall and apex.  By comparison to the resting portion of the study, no reversibility was apparent.  The gated reconstruction demonstrated moderately impaired overall left ventricular systolic function with an estimated ejection fraction of 31%. There was akinesis to mild dyskinesis or perhaps paradoxical motion of the septum as well as virtual akinesis of the inferior and inferoseptal segments. Systolic accentuation of activity is absent in the septum and markedly decreased in the anteroapical segment.  IMPRESSION: Abnormal pharmacologic stress nuclear myocardial study revealing a nondiagnostic EKG due to the  presence of an intraventricular conduction delay at baseline, substantial left ventricular enlargement with moderately impaired left ventricular systolic function in a segmental pattern, and anteroseptal and apical myocardial scarring without apparent ischemia.  Other findings as noted.  Original Report Authenticated By: [id number]   Cardiac Cath: 4/22 Impression:  1. No angiographic evidence of CAD  2. Severe LV systolic dysfunction secondary to non-ischemic cardiomyopathy  3. Normal filling pressures.  FOLLOW UP PLANS AND APPOINTMENTS Discharge Orders    Future Appointments: Provider: Department: Dept Phone: Center:   12/23/2011 1:00 PM Lendon Colonel, NP [facility] [phone] [facility]   02/10/2012 10:30 AM [facility] Echo [facility]  None       Allergies  Allergen Reactions  . Penicillins     Family unsure of reaction, but certain mom said she was allergic   Medication List  As of 12/07/2011 12:43 PM   STOP taking these medications         amLODipine 10 MG tablet      aspirin 325 MG tablet      benazepril 40 MG tablet      GLIPIZIDE XL 10 MG 24 hr tablet         TAKE these medications         albuterol 108 (90 BASE) MCG/ACT inhaler   Commonly known as: PROVENTIL HFA;VENTOLIN HFA   Inhale 2 puffs into the lungs every 6 (six) hours as needed. For wheezing      carvedilol 3.125 MG tablet   Commonly known as: COREG   Take 1 tablet (3.125 mg total) by mouth 2 (two) times daily with a meal.      furosemide 40 MG tablet   Commonly known as: LASIX   Take 1 tablet (40 mg total) by mouth daily.      glipiZIDE 10 MG 24 hr tablet   Commonly known as: GLUCOTROL XL   Take 20 mg by mouth daily.      lisinopril 2.5 MG tablet   Commonly known as: PRINIVIL,ZESTRIL   Take 1 tablet (2.5 mg total) by mouth daily.  mulitivitamin with minerals Tabs   Take 1 tablet by mouth every morning.      rosuvastatin 40 MG tablet   Commonly known as: CRESTOR   Take 40 mg by mouth at bedtime.      sitaGLIPtan-metformin 50-1000 MG per tablet   Commonly known as: JANUMET   Take 1 tablet by mouth 2 (two) times daily with a meal.      spironolactone 25 MG tablet   Commonly known as: ALDACTONE   Take 0.5 tablets (12.5 mg total) by mouth daily.             BRING ALL MEDICATIONS WITH YOU TO FOLLOW UP APPOINTMENTS  Time spent with patient to include physician time: 46 min Signed: Rosaria Ferries 12/07/2011, 12:43 PM Co-Sign MD

## 2011-12-16 ENCOUNTER — Ambulatory Visit (HOSPITAL_COMMUNITY)
Admission: RE | Admit: 2011-12-16 | Discharge: 2011-12-16 | Disposition: A | Discharge status: Home / Self Care | Payer: Medicare Other | Source: Ambulatory Visit | Attending: Family Medicine | Admitting: Family Medicine

## 2011-12-16 DIAGNOSIS — J189 Pneumonia, unspecified organism: Secondary | ICD-10-CM

## 2011-12-16 DIAGNOSIS — I517 Cardiomegaly: Secondary | ICD-10-CM | POA: Insufficient documentation

## 2011-12-16 DIAGNOSIS — R05 Cough: Secondary | ICD-10-CM | POA: Insufficient documentation

## 2011-12-16 DIAGNOSIS — R059 Cough, unspecified: Secondary | ICD-10-CM | POA: Insufficient documentation

## 2011-12-17 ENCOUNTER — Other Ambulatory Visit: Payer: Self-pay | Admitting: Family Medicine

## 2011-12-17 DIAGNOSIS — R9389 Abnormal findings on diagnostic imaging of other specified body structures: Secondary | ICD-10-CM

## 2011-12-21 ENCOUNTER — Ambulatory Visit (INDEPENDENT_AMBULATORY_CARE_PROVIDER_SITE_OTHER): Payer: Medicare Other | Admitting: Family Medicine

## 2011-12-21 ENCOUNTER — Encounter: Payer: Self-pay | Admitting: Family Medicine

## 2011-12-21 VITALS — BP 90/74 | HR 125 | Resp 16 | Ht 65.25 in | Wt 175.0 lb

## 2011-12-21 DIAGNOSIS — J9600 Acute respiratory failure, unspecified whether with hypoxia or hypercapnia: Secondary | ICD-10-CM

## 2011-12-21 DIAGNOSIS — I10 Essential (primary) hypertension: Secondary | ICD-10-CM

## 2011-12-21 DIAGNOSIS — E785 Hyperlipidemia, unspecified: Secondary | ICD-10-CM

## 2011-12-21 DIAGNOSIS — E119 Type 2 diabetes mellitus without complications: Secondary | ICD-10-CM

## 2011-12-21 NOTE — Progress Notes (Signed)
  Subjective:    Patient ID: [name] [name], female    DOB: 06-29-1946, 65 y.o.   MRN: [id number]  HPI Pt [facility] with acute on chronic heart failure on 4/17, had cath on 4/22, no cAD, but EF 20 to 25%. Reports feeling much better with less fatigue than she had been having over the past several ,months. She is being careful with salt  And fluid intake , the importance of this is stressed. Denies hypoglycemic episodes, fasting sugars are generally between 110 to 120   Review of Systems See HPI Denies recent fever or chills.Still regaining her strength from recent hospitalization for acute heart failure Denies sinus pressure, nasal congestion, ear pain or sore throat. Denies chest congestion, productive cough or wheezing.Has exertional fatigue Denies chest pains, palpitations and leg swelling Denies abdominal pain, nausea, vomiting,diarrhea or constipation.   Denies dysuria, frequency, hesitancy or incontinence. Denies joint pain, swelling and limitation in mobility. Denies headaches, seizures, numbness, or tingling. Denies depression, anxiety or insomnia. Denies skin break down or rash.        Objective:   Physical Exam Patient alert and oriented and in no cardiopulmonary distress.  HEENT: No facial asymmetry, EOMI, no sinus tenderness,  oropharynx pink and moist.  Neck supple no adenopathy.  Chest: Clear to auscultation bilaterally.  CVS: S1, S2 , systolic  murmur, no S3.No JVD  ABD: Soft non tender. Bowel sounds normal.  Ext: No edema  MS: Adequate though reducwd ROM spine, shoulders, hips and knees.  Skin: Intact, no ulcerations or rash noted.  Psych: Good eye contact, normal affect. Memory intact not anxious or depressed appearing.  CNS: CN 2-12 intact, power, tone and sensation normal throughout.        Assessment & Plan:

## 2011-12-21 NOTE — Patient Instructions (Signed)
F/U 2nd week in August  Please call if you need me before  Fasting lipid, cmp and EGFR, HBA1C 3 to 4 days before visit, also please do the CXR  Please monitor fluid intake and salt intake and weight daily. You are being referred to [facility], to help to co ordinate and improve your care

## 2011-12-23 ENCOUNTER — Encounter: Payer: Self-pay | Admitting: Adult Health

## 2011-12-23 ENCOUNTER — Ambulatory Visit (INDEPENDENT_AMBULATORY_CARE_PROVIDER_SITE_OTHER): Payer: Medicare Other | Admitting: Adult Health

## 2011-12-23 VITALS — BP 115/79 | HR 92 | Resp 16 | Ht 62.0 in | Wt 174.0 lb

## 2011-12-23 DIAGNOSIS — I359 Nonrheumatic aortic valve disorder, unspecified: Secondary | ICD-10-CM

## 2011-12-23 DIAGNOSIS — I350 Nonrheumatic aortic (valve) stenosis: Secondary | ICD-10-CM

## 2011-12-23 DIAGNOSIS — I10 Essential (primary) hypertension: Secondary | ICD-10-CM

## 2011-12-23 DIAGNOSIS — I428 Other cardiomyopathies: Secondary | ICD-10-CM

## 2011-12-23 MED ORDER — CARVEDILOL 6.25 MG PO TABS: 6.2500 mg | ORAL_TABLET | Freq: Two times a day (BID) | ORAL | Status: DC

## 2011-12-23 MED ORDER — LORATADINE 10 MG PO TABS: 10.0000 mg | ORAL_TABLET | Freq: Every day | ORAL | Status: DC

## 2011-12-23 NOTE — Assessment & Plan Note (Signed)
She is not symptomatic at present. Repeat echo in 6 weeks which has been scheduled prior to her discharge from [facility]. Will continue to monitor this.

## 2011-12-23 NOTE — Assessment & Plan Note (Signed)
Currently well controlled. Will follow on increased dose of coreg.

## 2011-12-23 NOTE — Progress Notes (Signed)
HPI: [name] [name] is a pleasant 66 y/o patient of [name] we are seeing on hospital follow-up after lengthy hospitalization which began at [facility] with VDRF, Systolic CHF and hypertension. She was diuresed 25 lbs and stabilized. Transfer to [facility] hospital for cath secondary to severely reduced LV fx of 25%.Echo also demonstrated mild to moderate AoV stenosis with mean gradient of 64mmHg,  Cath demonstrated no angiographic evidence of CAD, LVEF was confirmed at 20-25%. She was started on coreg 3.125 BID, spironolactone, lasix and lisinopril. She has tolerated the medications well without complaint, but is often fatigued. She is medically compliant and has eliminated salt from her diet. She has not gained any wt or had any complaints of edema.She complains of allergy symptoms of running nose and watery eyes.  Allergies  Allergen Reactions  . Penicillins     Family unsure of reaction, but certain mom said she was allergic    Current Outpatient Prescriptions  Medication Sig Dispense Refill  . albuterol (PROVENTIL HFA;VENTOLIN HFA) 108 (90 BASE) MCG/ACT inhaler Inhale 2 puffs into the lungs every 6 (six) hours as needed. For wheezing      . carvedilol (COREG) 3.125 MG tablet Take 1 tablet (3.125 mg total) by mouth 2 (two) times daily with a meal.  60 tablet  12  . furosemide (LASIX) 40 MG tablet Take 1 tablet (40 mg total) by mouth daily.  30 tablet  11  . glipiZIDE (GLUCOTROL XL) 10 MG 24 hr tablet Take 20 mg by mouth daily.       Marland Kitchen lisinopril (PRINIVIL,ZESTRIL) 2.5 MG tablet Take 1 tablet (2.5 mg total) by mouth daily.  30 tablet  11  . Multiple Vitamin (MULITIVITAMIN WITH MINERALS) TABS Take 1 tablet by mouth every morning.      . rosuvastatin (CRESTOR) 40 MG tablet Take 40 mg by mouth at bedtime.      . sitaGLIPtan-metformin (JANUMET) 50-1000 MG per tablet Take 1 tablet by mouth 2 (two) times daily with a meal. HOLD for 24 hours, restart on 12/08/2011      . spironolactone (ALDACTONE) 25 MG  tablet Take 0.5 tablets (12.5 mg total) by mouth daily.  20 tablet  11  . DISCONTD: cetirizine (ZYRTEC) 10 MG tablet Take 1 tablet (10 mg total) by mouth daily.  30 tablet  3  . DISCONTD: ferrous sulfate 325 (65 FE) MG EC tablet Take 1 tablet (325 mg total) by mouth 2 (two) times daily.  60 tablet  5  . DISCONTD: FLUoxetine (PROZAC) 10 MG tablet Take 1 tablet (10 mg total) by mouth daily. Take one capsule by mouth once a day  30 tablet  3  . DISCONTD: fluticasone (FLONASE) 50 MCG/ACT nasal spray Place 2 sprays into the nose daily.  16 g  3    Past Medical History  Diagnosis Date  . 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 Hyperlipidemia   . Anxiety   . Aortic stenosis 2009    Echocardiogram 2009-normal EF, mild AS; negative stress nuclear in 2004; negative carotid duplex study in 2009; echo 10/2011-mild LVH, AS-IS-apical akinesis with EF of 20-25%,  mild to mod. AS  . Pneumonia 10/2011    Hospitalized  . Palpitations     freq. PVCs  . Cardiomyopathy 10/2011    EF of 20-25% on echo in 10/2011  . Systolic dysfunction [id number]    severe  . Heart murmur   . Shortness of breath   .  Diabetes mellitus, type 2     insulin dependent  . Arthritis     in knee    Past Surgical History  Procedure Date  . Tubal ligation   . Cesarean section   . Cardiac catheterization 12/05/2011    [id number] of systems complete and found to be negative unless listed above  PHYSICAL EXAM BP 115/79  Pulse 92  Resp 16  Ht 5\' 2"  (1.575 m)  Wt 174 lb (78.926 kg)  BMI 31.83 kg/m2  General: Well developed, well nourished, in no acute distress Head: Eyes PERRLA, No xanthomas.   Normal cephalic and atraumatic. Some clear drainage from eyes and nose.  Lungs: Clear bilaterally to auscultation and percussion. Heart: HRRR S1 [id number] holosystolic murmur.  Pulses are 2+ & equal.            Some radiation to carotids. No JVD.  No abdominal bruits. No femoral bruits. Abdomen:  Bowel sounds are positive, abdomen soft and non-tender without masses or                  Hernia's noted. Msk:  Back normal, normal gait. Normal strength and tone for age. Extremities: No clubbing, cyanosis or edema.  DP +1 Neuro: Alert and oriented X 3. Psych:  Good affect, responds appropriately   ASSESSMENT AND PLAN

## 2011-12-23 NOTE — Assessment & Plan Note (Signed)
Non ischemic CM with no evidence of CAD per cardiac cath. Will increase coreg to 6.25 mg BID with continued up titration as she tolerates this.  She is fatigued from lengthy hospitalization but is slowly getting her strength back and maintaining her wt. She is encourage to continue on her current lifestyle changes. I will check a BMET for kidney fx and potassium status on spironolactone. Echo is to be done in 6 weeks from this visit for evaluation of her LV status. Discussion for ICD may be needed should she not show any improvement on current medical management.

## 2011-12-23 NOTE — Patient Instructions (Signed)
**Note De-Identified Ziara Thelander Obfuscation** Your physician has recommended you make the following change in your medication: increase Coreg to 6.25 mg twice daily (you may take 2 tablets of your 3.125 mg tablets to equal 6.25 mg twice daily until you finish current bottle) and start taking Claritin 10 mg as needed for allergies  Your physician recommends that you return for lab work in: today  Your physician recommends that you schedule a follow-up appointment in: 2 months

## 2011-12-25 NOTE — Assessment & Plan Note (Signed)
Hyperlipidemia:Low fat diet discussed and encouraged.  Controlled, no change in medication   

## 2011-12-25 NOTE — Assessment & Plan Note (Signed)
Controlled, no change in medication  

## 2011-12-25 NOTE — Assessment & Plan Note (Signed)
Resolved due to acute CHF

## 2011-12-26 LAB — BASIC METABOLIC PANEL
BUN: 23 mg/dL (ref 6–23)
CO2: 27 mEq/L (ref 19–32)
Calcium: 9.7 mg/dL (ref 8.4–10.5)
Chloride: 105 mEq/L (ref 96–112)
Creat: 1.3 mg/dL — ABNORMAL HIGH (ref 0.50–1.10)
Glucose, Bld: 138 mg/dL — ABNORMAL HIGH (ref 70–99)
Potassium: 4.1 mEq/L (ref 3.5–5.3)
Sodium: 142 mEq/L (ref 135–145)

## 2012-01-02 ENCOUNTER — Other Ambulatory Visit: Payer: Self-pay

## 2012-02-09 ENCOUNTER — Ambulatory Visit (HOSPITAL_COMMUNITY)
Admission: RE | Admit: 2012-02-09 | Discharge: 2012-02-09 | Disposition: A | Discharge status: Home / Self Care | Payer: Medicare Other | Source: Ambulatory Visit | Attending: Cardiology | Admitting: Cardiology

## 2012-02-09 DIAGNOSIS — I079 Rheumatic tricuspid valve disease, unspecified: Secondary | ICD-10-CM | POA: Insufficient documentation

## 2012-02-09 DIAGNOSIS — I059 Rheumatic mitral valve disease, unspecified: Secondary | ICD-10-CM | POA: Insufficient documentation

## 2012-02-09 DIAGNOSIS — I517 Cardiomegaly: Secondary | ICD-10-CM | POA: Insufficient documentation

## 2012-02-09 DIAGNOSIS — I10 Essential (primary) hypertension: Secondary | ICD-10-CM | POA: Insufficient documentation

## 2012-02-09 DIAGNOSIS — I428 Other cardiomyopathies: Secondary | ICD-10-CM | POA: Insufficient documentation

## 2012-02-09 DIAGNOSIS — I359 Nonrheumatic aortic valve disorder, unspecified: Secondary | ICD-10-CM

## 2012-02-09 DIAGNOSIS — E119 Type 2 diabetes mellitus without complications: Secondary | ICD-10-CM | POA: Insufficient documentation

## 2012-02-09 DIAGNOSIS — E785 Hyperlipidemia, unspecified: Secondary | ICD-10-CM

## 2012-02-09 NOTE — Progress Notes (Signed)
*  PRELIMINARY RESULTS* Echocardiogram 2D Echocardiogram has been performed.  [name] [name] 02/09/2012, 10:21 AM

## 2012-02-10 ENCOUNTER — Ambulatory Visit (HOSPITAL_COMMUNITY): Payer: Medicare Other

## 2012-02-17 ENCOUNTER — Encounter: Payer: Self-pay | Admitting: Cardiology

## 2012-02-20 LAB — LIPID PANEL
Cholesterol: 194 mg/dL (ref 0–200)
HDL: 45 mg/dL (ref >39)
LDL Cholesterol: 130 mg/dL — ABNORMAL HIGH (ref 0–99)
Total CHOL/HDL Ratio: 4.3 Ratio
Triglycerides: 93 mg/dL (ref <150)
VLDL: 19 mg/dL (ref 0–40)

## 2012-02-20 LAB — COMPLETE METABOLIC PANEL WITH GFR
ALT: 10 U/L (ref 0–35)
AST: 14 U/L (ref 0–37)
Albumin: 4.3 g/dL (ref 3.5–5.2)
Alkaline Phosphatase: 35 U/L — ABNORMAL LOW (ref 39–117)
BUN: 21 mg/dL (ref 6–23)
CO2: 23 mEq/L (ref 19–32)
Calcium: 10 mg/dL (ref 8.4–10.5)
Chloride: 107 mEq/L (ref 96–112)
Creat: 1.03 mg/dL (ref 0.50–1.10)
GFR, Est African American: 65 mL/min
GFR, Est Non African American: 57 mL/min — ABNORMAL LOW
Glucose, Bld: 105 mg/dL — ABNORMAL HIGH (ref 70–99)
Potassium: 4.4 mEq/L (ref 3.5–5.3)
Sodium: 142 mEq/L (ref 135–145)
Total Bilirubin: 0.4 mg/dL (ref 0.3–1.2)
Total Protein: 7.2 g/dL (ref 6.0–8.3)

## 2012-02-20 LAB — HEMOGLOBIN A1C
Hgb A1c MFr Bld: 6.6 % — ABNORMAL HIGH (ref <5.7)
Mean Plasma Glucose: 143 mg/dL — ABNORMAL HIGH (ref <117)

## 2012-02-22 ENCOUNTER — Ambulatory Visit (INDEPENDENT_AMBULATORY_CARE_PROVIDER_SITE_OTHER): Payer: Medicare Other | Admitting: Family Medicine

## 2012-02-22 ENCOUNTER — Encounter: Payer: Self-pay | Admitting: Family Medicine

## 2012-02-22 VITALS — BP 120/70 | HR 90 | Resp 18 | Ht 65.25 in | Wt 174.0 lb

## 2012-02-22 DIAGNOSIS — I10 Essential (primary) hypertension: Secondary | ICD-10-CM

## 2012-02-22 DIAGNOSIS — E669 Obesity, unspecified: Secondary | ICD-10-CM

## 2012-02-22 DIAGNOSIS — J309 Allergic rhinitis, unspecified: Secondary | ICD-10-CM

## 2012-02-22 DIAGNOSIS — E785 Hyperlipidemia, unspecified: Secondary | ICD-10-CM

## 2012-02-22 DIAGNOSIS — E119 Type 2 diabetes mellitus without complications: Secondary | ICD-10-CM

## 2012-02-22 DIAGNOSIS — J302 Other seasonal allergic rhinitis: Secondary | ICD-10-CM

## 2012-02-22 MED ORDER — METHYLPREDNISOLONE ACETATE 80 MG/ML IJ SUSP
80.0000 mg | Freq: Once | INTRAMUSCULAR | Status: AC
  Administered 2012-02-22: 80 mg via INTRAMUSCULAR

## 2012-02-22 MED ORDER — PREDNISONE (PAK) 5 MG PO TABS: 5.0000 mg | ORAL_TABLET | ORAL | Status: DC

## 2012-02-22 MED ORDER — CHOLINE FENOFIBRATE 135 MG PO CPDR: 135.0000 mg | DELAYED_RELEASE_CAPSULE | Freq: Every day | ORAL | Status: DC

## 2012-02-22 NOTE — Assessment & Plan Note (Signed)
Controlled, no change in medication  

## 2012-02-22 NOTE — Assessment & Plan Note (Signed)
Unchanged. Patient re-educated about  the importance of commitment to a  minimum of 150 minutes of exercise per week. The importance of healthy food choices with portion control discussed. Encouraged to start a food diary, count calories and to consider  joining a support group. Sample diet sheets offered. Goals set by the patient for the next several months.    

## 2012-02-22 NOTE — Patient Instructions (Addendum)
F/u as before.  Your symptoms are due to uncontrolled allergies.  You will get depo medrol 80mg  IM in the office and a prednisone dose pack is also sent in.  Take sudafed, one daily for the next 5 days , this will help to reduce the drainage down your throat.  Please do CXR in August before f/u visit

## 2012-02-22 NOTE — Assessment & Plan Note (Signed)
Uncontrolled, low fat diet discussed, pt reportseaing increased amt of sweets and cookies, add trilipix

## 2012-02-22 NOTE — Assessment & Plan Note (Signed)
Uncontrolled, depo medrol 80mg  IM , prednisone dose pack and  sudafed

## 2012-03-08 NOTE — Progress Notes (Signed)
  Subjective:    Patient ID: [name] [name], female    DOB: April 02, 1946, 66 y.o.   MRN: [id number]  HPI 1 week h/o increased nasal congestion and sinus pressure which is worsening, excessive sneezing and watery eyes, she denies fever or chills, and her cough is productive of white sputum Reports blood sugars running in a normal range when checked, and denies polyuria, polydipsia, blurred vision or hypoglycemic episodes. Denies PND, orthopnea, leg swelling or chest pain   Review of Systems See HPI  Denies chest pains, palpitations and leg swelling Denies abdominal pain, nausea, vomiting,diarrhea or constipation.   Denies dysuria, frequency, hesitancy or incontinence. Denies joint pain, swelling and limitation in mobility. Denies headaches, seizures, numbness, or tingling. Denies depression, anxiety or insomnia. Denies skin break down or rash.        Objective:   Physical Exam  Patient alert and oriented and in no cardiopulmonary distress.  HEENT: No facial asymmetry, EOMI, no sinus tenderness,  oropharynx pink and moist.  Neck supple no adenopathy.erythema and edema of nasal mucosa and bilateral conjuntival injection with watery eyes  Chest: Clear to auscultation bilaterally.  CVS: S1, S2 no murmurs, no S3.  ABD: Soft non tender. Bowel sounds normal.  Ext: No edema  MS: Adequate ROM spine, shoulders, hips and knees.  Skin: Intact, no ulcerations or rash noted.  Psych: Good eye contact, normal affect. Memory intact not anxious or depressed appearing.  CNS: CN 2-12 intact, power, tone and sensation normal throughout.       Assessment & Plan:

## 2012-03-21 ENCOUNTER — Ambulatory Visit (HOSPITAL_COMMUNITY)
Admission: RE | Admit: 2012-03-21 | Discharge: 2012-03-21 | Disposition: A | Discharge status: Home / Self Care | Payer: Medicare Other | Source: Ambulatory Visit | Attending: Family Medicine | Admitting: Family Medicine

## 2012-03-21 DIAGNOSIS — R9389 Abnormal findings on diagnostic imaging of other specified body structures: Secondary | ICD-10-CM

## 2012-03-21 DIAGNOSIS — R918 Other nonspecific abnormal finding of lung field: Secondary | ICD-10-CM | POA: Insufficient documentation

## 2012-03-22 ENCOUNTER — Encounter: Payer: Self-pay | Admitting: Family Medicine

## 2012-03-22 ENCOUNTER — Ambulatory Visit (INDEPENDENT_AMBULATORY_CARE_PROVIDER_SITE_OTHER): Payer: Medicare Other | Admitting: Family Medicine

## 2012-03-22 ENCOUNTER — Other Ambulatory Visit: Payer: Self-pay | Admitting: Family Medicine

## 2012-03-22 VITALS — BP 100/64 | HR 88 | Resp 18 | Ht 65.25 in | Wt 176.1 lb

## 2012-03-22 DIAGNOSIS — Z139 Encounter for screening, unspecified: Secondary | ICD-10-CM

## 2012-03-22 DIAGNOSIS — I10 Essential (primary) hypertension: Secondary | ICD-10-CM

## 2012-03-22 DIAGNOSIS — E119 Type 2 diabetes mellitus without complications: Secondary | ICD-10-CM

## 2012-03-22 DIAGNOSIS — E785 Hyperlipidemia, unspecified: Secondary | ICD-10-CM

## 2012-03-22 MED ORDER — GLIPIZIDE ER 10 MG PO TB24: 20.0000 mg | ORAL_TABLET | Freq: Every day | ORAL | Status: DC

## 2012-03-22 MED ORDER — ALBUTEROL SULFATE HFA 108 (90 BASE) MCG/ACT IN AERS: 2.0000 | INHALATION_SPRAY | Freq: Four times a day (QID) | RESPIRATORY_TRACT | Status: DC | PRN

## 2012-03-22 MED ORDER — CHOLINE FENOFIBRATE 135 MG PO CPDR: 135.0000 mg | DELAYED_RELEASE_CAPSULE | Freq: Every day | ORAL | Status: DC

## 2012-03-22 MED ORDER — ROSUVASTATIN CALCIUM 40 MG PO TABS: 40.0000 mg | ORAL_TABLET | Freq: Every day | ORAL | Status: DC

## 2012-03-22 MED ORDER — SITAGLIPTIN PHOS-METFORMIN HCL 50-1000 MG PO TABS: 1.0000 | ORAL_TABLET | Freq: Two times a day (BID) | ORAL | Status: DC

## 2012-03-22 NOTE — Patient Instructions (Addendum)
Pelvic and breast exam in December.  HBA1C, fasting lipid, cmp and eGFR  No med changes  Mammogram past de will be scheduled at checkout  You need a colonoscopy

## 2012-03-27 ENCOUNTER — Telehealth: Payer: Self-pay | Admitting: Family Medicine

## 2012-03-27 NOTE — Telephone Encounter (Signed)
Called and left message for pt to return call.  Unsure of what pharmacy she is using.

## 2012-03-29 ENCOUNTER — Other Ambulatory Visit: Payer: Self-pay

## 2012-03-29 ENCOUNTER — Telehealth: Payer: Self-pay

## 2012-03-29 DIAGNOSIS — E785 Hyperlipidemia, unspecified: Secondary | ICD-10-CM

## 2012-03-29 MED ORDER — CHOLINE FENOFIBRATE 135 MG PO CPDR: 135.0000 mg | DELAYED_RELEASE_CAPSULE | Freq: Every day | ORAL | Status: DC

## 2012-03-29 NOTE — Telephone Encounter (Signed)
pls see if pharmacy will provide generic fenofibrate, that should be affordable, let pt know after you check

## 2012-03-29 NOTE — Telephone Encounter (Signed)
Med sent per pt request

## 2012-03-29 NOTE — Telephone Encounter (Signed)
Pt went to get her trilipix and it was $45. Can't afford. Needs something cheaper sent to [place]

## 2012-03-30 ENCOUNTER — Ambulatory Visit (HOSPITAL_COMMUNITY): Payer: Medicare Other

## 2012-03-30 MED ORDER — FENOFIBRATE 145 MG PO TABS: 145.0000 mg | ORAL_TABLET | Freq: Every day | ORAL | Status: DC

## 2012-03-30 NOTE — Assessment & Plan Note (Signed)
Controlled, no change in medication DASH diet and commitment to daily physical activity for a minimum of 30 minutes discussed and encouraged, as a part of hypertension management. The importance of attaining a healthy weight is also discussed.  

## 2012-03-30 NOTE — Telephone Encounter (Signed)
A new med was sent in to see if it was covered

## 2012-03-30 NOTE — Assessment & Plan Note (Signed)
ELEVATED ldl, PT TO REDUCE FATTY FOOD INTAKE, NO MED CHANGE

## 2012-03-30 NOTE — Progress Notes (Signed)
  Subjective:    Patient ID: ROI CIARCIA, female    DOB: [birth year], 66 y.o.   MRN: [id number]  HPI The PT is here for follow up and re-evaluation of chronic medical conditions, medication management and review of any available recent lab and radiology data.  Preventive health is updated, specifically  Cancer screening and Immunization.   Questions or concerns regarding consultations or procedures which the PT has had in the interim are  addressed. The PT denies any adverse reactions to current medications since the last visit.  There are no new concerns.  There are no specific complaints       Review of Systems See HPI Denies recent fever or chills. Denies sinus pressure, nasal congestion, ear pain or sore throat. Denies chest congestion, productive cough or wheezing. Denies chest pains, palpitations and leg swelling Denies abdominal pain, nausea, vomiting,diarrhea or constipation.   Denies dysuria, frequency, hesitancy or incontinence. Denies joint pain, swelling and limitation in mobility. Denies headaches, seizures, numbness, or tingling. Denies depression, anxiety or insomnia. Denies skin break down or rash.        Objective:   Physical Exam   Patient alert and oriented and in no cardiopulmonary distress.  HEENT: No facial asymmetry, EOMI, no sinus tenderness,  oropharynx pink and moist.  Neck supple no adenopathy.  Chest: Clear to auscultation bilaterally.  CVS: S1, S2 no murmurs, no S3.  ABD: Soft non tender. Bowel sounds normal.  Ext: No edema  MS: Adequate ROM spine, shoulders, hips and knees.  Skin: Intact, no ulcerations or rash noted.  Psych: Good eye contact, normal affect. Memory intact not anxious or depressed appearing.  CNS: CN 2-12 intact, power, tone and sensation normal throughout.      Assessment & Plan:

## 2012-03-30 NOTE — Assessment & Plan Note (Signed)
Controlled, no change in medication Patient advised to reduce carb and sweets, commit to regular physical activity, take meds as prescribed, test blood as directed, and attempt to lose weight, to improve blood sugar control.  

## 2012-04-02 ENCOUNTER — Encounter: Payer: Self-pay | Admitting: Cardiology

## 2012-04-02 ENCOUNTER — Ambulatory Visit (INDEPENDENT_AMBULATORY_CARE_PROVIDER_SITE_OTHER): Payer: Medicare Other | Admitting: Cardiology

## 2012-04-02 VITALS — BP 109/68 | HR 86 | Ht 62.0 in | Wt 177.0 lb

## 2012-04-02 DIAGNOSIS — I350 Nonrheumatic aortic (valve) stenosis: Secondary | ICD-10-CM

## 2012-04-02 DIAGNOSIS — E785 Hyperlipidemia, unspecified: Secondary | ICD-10-CM

## 2012-04-02 DIAGNOSIS — I359 Nonrheumatic aortic valve disorder, unspecified: Secondary | ICD-10-CM

## 2012-04-02 DIAGNOSIS — I10 Essential (primary) hypertension: Secondary | ICD-10-CM

## 2012-04-02 DIAGNOSIS — D649 Anemia, unspecified: Secondary | ICD-10-CM

## 2012-04-02 DIAGNOSIS — I428 Other cardiomyopathies: Secondary | ICD-10-CM

## 2012-04-02 DIAGNOSIS — I429 Cardiomyopathy, unspecified: Secondary | ICD-10-CM

## 2012-04-02 MED ORDER — ATORVASTATIN CALCIUM 40 MG PO TABS: 40.0000 mg | ORAL_TABLET | Freq: Every day | ORAL | Status: DC

## 2012-04-02 MED ORDER — CARVEDILOL 12.5 MG PO TABS: 12.5000 mg | ORAL_TABLET | Freq: Two times a day (BID) | ORAL | Status: DC

## 2012-04-02 MED ORDER — SPIRONOLACTONE 25 MG PO TABS: 25.0000 mg | ORAL_TABLET | Freq: Every day | ORAL | Status: DC

## 2012-04-02 NOTE — Patient Instructions (Addendum)
Your physician recommends that you schedule a follow-up appointment in:  1 - 2 months with Dr Lattie Haw 2 - Dr Lovena Le to discuss Defibrillator placement  Your physician has recommended you make the following change in your medication:  1 - INCREASE Coreg to 12.5 mg twice a day 2 - INCREASE Aldactone to 25 mg daily 3 - STOP Triplix 4 - STOP Crestor 5 - START Atorvastatin (Lipitor) 40 mg daily  Your physician has requested that you regularly monitor and record your blood pressure readings at home. Please use the same machine at the same time of day to check your readings and record them to bring to your follow-up visit. Call us if blood pressure is below 90 on the top number.  Your physician recommends that you return for lab work in: 1 week and 3 weeks

## 2012-04-02 NOTE — Assessment & Plan Note (Addendum)
Persistent cardiomyopathy of unknown etiology.  Patient will be referred to [name] for consideration of AICD.  She has a left bundle branch block and will require biventricular pacing as well.  Medical therapy is not optimized, and doses of her cardiac drugs will be increased as tolerated.

## 2012-04-02 NOTE — Progress Notes (Deleted)
Name: [name] [name]    DOB: April 28, 1946  Age: 66 y.o.  MR#: [id number]       PCP:  [name], MD      Insurance: @PAYORNAME @   CC:   No chief complaint on file.   VS BP 109/68  Pulse 86  Ht 5\' 2"  (1.575 m)  Wt 177 lb (80.287 kg)  BMI 32.37 kg/m2  Weights Current Weight  04/02/12 177 lb (80.287 kg)  03/22/12 176 lb 1.9 oz (79.888 kg)  02/22/12 174 lb (78.926 kg)    Blood Pressure  BP Readings from Last 3 Encounters:  04/02/12 109/68  03/22/12 100/64  02/22/12 120/70     Admit date:  (Not on file) Last encounter with RMR:  02/17/2012   Allergy Allergies  Allergen Reactions  . Penicillins     Family unsure of reaction, but certain mom said she was allergic    Current Outpatient Prescriptions  Medication Sig Dispense Refill  . albuterol (PROVENTIL HFA;VENTOLIN HFA) 108 (90 BASE) MCG/ACT inhaler Inhale 2 puffs into the lungs every 6 (six) hours as needed. For wheezing  18 g  1  . carvedilol (COREG) 6.25 MG tablet Take 1 tablet (6.25 mg total) by mouth 2 (two) times daily with a meal.  60 tablet  3  . Choline Fenofibrate (TRILIPIX) 135 MG capsule Take 1 capsule (135 mg total) by mouth daily.  30 capsule  0  . furosemide (LASIX) 40 MG tablet Take 1 tablet (40 mg total) by mouth daily.  30 tablet  11  . glipiZIDE (GLUCOTROL XL) 10 MG 24 hr tablet Take 2 tablets (20 mg total) by mouth daily.  180 tablet  1  . lisinopril (PRINIVIL,ZESTRIL) 2.5 MG tablet Take 1 tablet (2.5 mg total) by mouth daily.  30 tablet  11  . Multiple Vitamin (MULITIVITAMIN WITH MINERALS) TABS Take 1 tablet by mouth every morning.      . rosuvastatin (CRESTOR) 40 MG tablet Take 1 tablet (40 mg total) by mouth at bedtime.  90 tablet  1  . sitaGLIPtan-metformin (JANUMET) 50-1000 MG per tablet Take 1 tablet by mouth 2 (two) times daily with a meal.  180 tablet  1  . spironolactone (ALDACTONE) 25 MG tablet Take 0.5 tablets (12.5 mg total) by mouth daily.  20 tablet  11  . DISCONTD: cetirizine (ZYRTEC) 10  MG tablet Take 1 tablet (10 mg total) by mouth daily.  30 tablet  3  . DISCONTD: ferrous sulfate 325 (65 FE) MG EC tablet Take 1 tablet (325 mg total) by mouth 2 (two) times daily.  60 tablet  5  . DISCONTD: FLUoxetine (PROZAC) 10 MG tablet Take 1 tablet (10 mg total) by mouth daily. Take one capsule by mouth once a day  30 tablet  3  . DISCONTD: fluticasone (FLONASE) 50 MCG/ACT nasal spray Place 2 sprays into the nose daily.  16 g  3    Discontinued Meds:    Medications Discontinued During This Encounter  Medication Reason  . fenofibrate (TRICOR) 145 MG tablet Discontinued by provider    Patient Active Problem List  Diagnosis  . DIABETES MELLITUS, TYPE II  . HYPERLIPIDEMIA  . Obesity  . Insomnia  . Anemia  . Hypertension  . Aortic stenosis  . Cardiomyopathy  . Respiratory distress  . Seasonal allergies    LABS No visits with results within 3 Month(s) from this visit. Latest known visit with results is:  Office Visit on 12/23/2011  Component Date Value  .  Sodium 12/26/2011 142   . Potassium 12/26/2011 4.1   . Chloride 12/26/2011 105   . CO2 12/26/2011 27   . Glucose, Bld 12/26/2011 138*  . BUN 12/26/2011 23   . Creat 12/26/2011 1.30*  . Calcium 12/26/2011 9.7      Results for this Opt Visit:     Results for orders placed in visit on 0000000  BASIC METABOLIC PANEL      Component Value Range   Sodium 142  135 - 145 mEq/L   Potassium 4.1  3.5 - 5.3 mEq/L   Chloride 105  96 - 112 mEq/L   CO2 27  19 - 32 mEq/L   Glucose, Bld 138 (*) 70 - 99 mg/dL   BUN 23  6 - 23 mg/dL   Creat 1.30 (*) 0.50 - 1.10 mg/dL   Calcium 9.7  8.4 - 10.5 mg/dL    EKG Orders placed during the hospital encounter of 11/30/11  . EKG 12-LEAD  . EKG 12-LEAD  . EKG     Prior Assessment and Plan Problem List as of 04/02/2012            Cardiology Problems   HYPERLIPIDEMIA   Last Assessment & Plan Note   03/22/2012 Office Visit Signed 03/30/2012  7:24 PM by Fayrene Helper, MD     ELEVATED ldl, PT TO REDUCE FATTY FOOD INTAKE, NO MED CHANGE    Hypertension   Last Assessment & Plan Note   03/22/2012 Office Visit Signed 03/30/2012  7:24 PM by Fayrene Helper, MD    Controlled, no change in medication DASH diet and commitment to daily physical activity for a minimum of 30 minutes discussed and encouraged, as a part of hypertension management. The importance of attaining a healthy weight is also discussed.     Aortic stenosis   Last Assessment & Plan Note   12/23/2011 Office Visit Signed 12/23/2011  1:41 PM by Lendon Colonel, NP    She is not symptomatic at present. Repeat echo in 6 weeks which has been scheduled prior to her discharge from [facility]. Will continue to monitor this.     Cardiomyopathy   Last Assessment & Plan Note   12/23/2011 Office Visit Signed 12/23/2011  1:40 PM by Lendon Colonel, NP    Non ischemic CM with no evidence of CAD per cardiac cath. Will increase coreg to 6.25 mg BID with continued up titration as she tolerates this.  She is fatigued from lengthy hospitalization but is slowly getting her strength back and maintaining her wt. She is encourage to continue on her current lifestyle changes. I will check a BMET for kidney fx and potassium status on spironolactone. Echo is to be done in 6 weeks from this visit for evaluation of her LV status. Discussion for ICD may be needed should she not show any improvement on current medical management.      Other   DIABETES MELLITUS, TYPE II   Last Assessment & Plan Note   03/22/2012 Office Visit Signed 03/30/2012  7:24 PM by Fayrene Helper, MD    Controlled, no change in medication Patient advised to reduce carb and sweets, commit to regular physical activity, take meds as prescribed, test blood as directed, and attempt to lose weight, to improve blood sugar control.     Obesity   Last Assessment & Plan Note   02/22/2012 Office Visit Signed 02/22/2012  3:12 PM by Fayrene Helper, MD     Unchanged Patient re-educated  about  the importance of commitment to a  minimum of 150 minutes of exercise per week. The importance of healthy food choices with portion control discussed. Encouraged to start a food diary, count calories and to consider  joining a support group. Sample diet sheets offered. Goals set by the patient for the next several months.       Insomnia   Last Assessment & Plan Note   03/22/2011 Office Visit Signed 03/22/2011  5:15 PM by Fayrene Helper, MD    Sleep hygiene discussed and literature given. Short course of restoril prescribed due to acute grief    Anemia   Last Assessment & Plan Note   11/10/2011 Office Visit Signed 11/10/2011  5:10 PM by Yehuda Savannah, MD    Anemia with iron studies consistent with iron deficiency.  Stool specimens for Hemoccult testing have been requested.  Referral to gastroenterology for further evaluation will probably be necessary.    Respiratory distress   Seasonal allergies   Last Assessment & Plan Note   02/22/2012 Office Visit Signed 02/22/2012  3:13 PM by Fayrene Helper, MD    Uncontrolled, depo medrol 80mg  IM , prednisone dose pack and  sudafed        Imaging: Dg Chest 2 View  03/21/2012  *RADIOLOGY REPORT*  Clinical Data: Left upper lobe opacity on previous radiograph  CHEST - 2 VIEW  Comparison: 12/16/2011 and earlier studies  Findings: Stable plate-like atelectasis and linear scarring in the lingula, best appreciated on the lateral radiograph.  Lungs otherwise clear.  No effusion.  Stable cardiomegaly.  Mild spurring in the lower thoracic spine.  IMPRESSION:  1.  Stable lingular plate-like atelectasis and scarring.  Original Report Authenticated By: Trecia Rogers, M.D.     [facility] Calculation: Score not calculated. Missing: Total Cholesterol

## 2012-04-02 NOTE — Assessment & Plan Note (Addendum)
Patient continues to have a microcytic anemia with iron studies suggesting iron deficiency.  A single stool Hemoccult was negative in 2011.  Upper and lower endoscopy appears to be indicated-I will discuss with patient's PCP, Dr. Moshe Cipro.

## 2012-04-02 NOTE — Progress Notes (Signed)
Patient ID: [name] [name], female   DOB: 1946-05-22, 66 y.o.   MRN: [id number]  HPI: Scheduled a return visit for this very nice woman with a nonischemic cardiomyopathy.  Since her last visit with me, she required hospital admission and mechanical ventilation for acute respiratory failure related to pulmonary edema.  Cardiac catheterization revealed no coronary artery disease but persistently impaired LV systolic function.  With medical therapy, she has done generally well.  She walks a few hundred yards per day without difficulty.  She is concerned about the cost of her medications.  Prior to Admission medications   Medication Sig Start Date End Date Taking? Authorizing Provider  albuterol (PROVENTIL HFA;VENTOLIN HFA) 108 (90 BASE) MCG/ACT inhaler Inhale 2 puffs into the lungs every 6 (six) hours as needed. For wheezing 03/22/12  Yes Fayrene Helper, MD  carvedilol (COREG) 12.5 MG tablet Take 1 tablet (12.5 mg total) by mouth 2 (two) times daily with a meal. 04/02/12 04/02/13 Yes Yehuda Savannah, MD  furosemide (LASIX) 40 MG tablet Take 1 tablet (40 mg total) by mouth daily. 12/07/11 12/06/12 Yes Rhonda G Barrett, PA  glipiZIDE (GLUCOTROL XL) 10 MG 24 hr tablet Take 2 tablets (20 mg total) by mouth daily. 03/22/12  Yes Fayrene Helper, MD  lisinopril (PRINIVIL,ZESTRIL) 2.5 MG tablet Take 1 tablet (2.5 mg total) by mouth daily. 12/07/11 12/06/12 Yes Rhonda G Barrett, PA  Multiple Vitamin (MULITIVITAMIN WITH MINERALS) TABS Take 1 tablet by mouth every morning.   Yes Historical Provider, MD  sitaGLIPtan-metformin (JANUMET) 50-1000 MG per tablet Take 1 tablet by mouth 2 (two) times daily with a meal. 03/22/12  Yes Fayrene Helper, MD  spironolactone (ALDACTONE) 25 MG tablet Take 1 tablet (25 mg total) by mouth daily. 04/02/12  Yes Yehuda Savannah, MD  atorvastatin (LIPITOR) 40 MG tablet Take 1 tablet (40 mg total) by mouth daily. 04/02/12 04/02/13  Yehuda Savannah, MD   Allergies  Allergen Reactions    . Penicillins     Family unsure of reaction, but certain mom said she was allergic     Past medical history, social history, and family history reviewed and updated.  ROS: Denies orthopnea, PND, peripheral edema, palpitations, lightheadedness or syncope.  No neurologic symptoms.  All other systems reviewed and are negative.  PHYSICAL EXAM: BP 109/68  Pulse 86  Ht 5\' 2"  (1.575 m)  Wt 80.287 kg (177 lb)  BMI 32.37 kg/m2  General-Well developed; no acute distress Body habitus-Overweight Neck-No JVD; Bilateral carotid bruits vs transmitted murmur Lungs-clear lung fields; resonant to percussion Cardiovascular-normal PMI; normal S1, Decreased aortic component of S2; grade 3/6 harsh systolic ejection murmur at the cardiac base Abdomen-normal bowel sounds; soft and non-tender without masses or organomegaly Musculoskeletal-No deformities, no cyanosis or clubbing Neurologic-Normal cranial nerves; symmetric strength and tone Skin-Warm, no significant lesions Extremities-distal pulses intact; trace edema  ASSESSMENT AND PLAN:  Jacqulyn Ducking, MD 04/02/2012 12:10 PM

## 2012-04-02 NOTE — Assessment & Plan Note (Signed)
Blood pressure has been on the low side, but patient has not been symptomatic.  We will continue to uptitrate doses of cardiac medications as tolerated.

## 2012-04-02 NOTE — Assessment & Plan Note (Signed)
Aortic valvular disease has not been critical by noninvasive studies, cardiac catheterization or exam.  We will continue to monitor.

## 2012-04-02 NOTE — Assessment & Plan Note (Signed)
Control of hyperlipidemia is excellent; however, her current regimen is extremely expensive.  I will substitute atorvastatin for Crestor, hold triplex and reassess lipid levels in 3 weeks.

## 2012-04-09 LAB — BASIC METABOLIC PANEL
BUN: 21 mg/dL (ref 6–23)
CO2: 26 mEq/L (ref 19–32)
Calcium: 10 mg/dL (ref 8.4–10.5)
Chloride: 106 mEq/L (ref 96–112)
Creat: 1.24 mg/dL — ABNORMAL HIGH (ref 0.50–1.10)
Glucose, Bld: 130 mg/dL — ABNORMAL HIGH (ref 70–99)
Potassium: 4.3 mEq/L (ref 3.5–5.3)
Sodium: 142 mEq/L (ref 135–145)

## 2012-04-13 ENCOUNTER — Other Ambulatory Visit: Payer: Self-pay | Admitting: Family Medicine

## 2012-04-13 ENCOUNTER — Telehealth: Payer: Self-pay | Admitting: Family Medicine

## 2012-04-13 DIAGNOSIS — Z1211 Encounter for screening for malignant neoplasm of colon: Secondary | ICD-10-CM

## 2012-04-13 DIAGNOSIS — D649 Anemia, unspecified: Secondary | ICD-10-CM

## 2012-04-13 NOTE — Telephone Encounter (Signed)
Pls let pt know she is referred to Dr Laural Golden for eval of her anemia , and screening colonoscopy, his office will give her a call.  Please let her also know that Dr Lattie Haw had contacted me recommending this which I totally agree with so she needs to keep the appt

## 2012-04-13 NOTE — Telephone Encounter (Signed)
Patient aware.

## 2012-04-14 ENCOUNTER — Encounter: Payer: Self-pay | Admitting: Cardiology

## 2012-04-17 ENCOUNTER — Ambulatory Visit (INDEPENDENT_AMBULATORY_CARE_PROVIDER_SITE_OTHER): Payer: Medicare Other | Admitting: Internal Medicine

## 2012-04-17 ENCOUNTER — Encounter: Payer: Self-pay | Admitting: *Deleted

## 2012-04-17 ENCOUNTER — Encounter: Payer: Self-pay | Admitting: Internal Medicine

## 2012-04-17 VITALS — BP 118/70 | HR 85 | Ht 62.0 in | Wt 175.0 lb

## 2012-04-17 DIAGNOSIS — I10 Essential (primary) hypertension: Secondary | ICD-10-CM

## 2012-04-17 DIAGNOSIS — I350 Nonrheumatic aortic (valve) stenosis: Secondary | ICD-10-CM

## 2012-04-17 DIAGNOSIS — I5022 Chronic systolic (congestive) heart failure: Secondary | ICD-10-CM

## 2012-04-17 DIAGNOSIS — I359 Nonrheumatic aortic valve disorder, unspecified: Secondary | ICD-10-CM

## 2012-04-17 NOTE — Patient Instructions (Addendum)
Your physician wants you to follow-up in: 4 months with [name].  You will receive a reminder letter in the mail two months in advance. If you don't receive a letter, please call our office to schedule the follow-up appointment.

## 2012-04-17 NOTE — Progress Notes (Signed)
HPI [name] [name] is referred today by [name] for consideration for ICD implantation. She is a very pleasant 66 year old woman who was recently diagnosed with a nonischemic cardiomyopathy. She has left bundle branch block. She presented back in March and April of 2013 with acute systolic heart failure. She did placed on maximal medical therapy and her symptoms are much improved, currently class II. She has not had syncope. She denies chest pain or shortness of breath. She remains active and can do her housework without difficulty. Her echocardiogram did demonstrate moderate mitral regurgitation and aortic stenosis. Repeat 2-D echocardiography demonstrated no improvement in her left ventricular function with persistence of an ejection fraction of 25%. She has left bundle branch block and a QRS duration of 156 ms. Allergies  Allergen Reactions  . Penicillins     Family unsure of reaction, but certain mom said she was allergic     Current Outpatient Prescriptions  Medication Sig Dispense Refill  . albuterol (PROVENTIL HFA;VENTOLIN HFA) 108 (90 BASE) MCG/ACT inhaler Inhale 2 puffs into the lungs every 6 (six) hours as needed. For wheezing  18 g  1  . atorvastatin (LIPITOR) 40 MG tablet Take 1 tablet (40 mg total) by mouth daily.  30 tablet  11  . carvedilol (COREG) 12.5 MG tablet Take 1 tablet (12.5 mg total) by mouth 2 (two) times daily with a meal.  60 tablet  11  . fenofibrate (TRICOR) 145 MG tablet Take 145 mg by mouth daily.      . furosemide (LASIX) 40 MG tablet Take 1 tablet (40 mg total) by mouth daily.  30 tablet  11  . glipiZIDE (GLUCOTROL XL) 10 MG 24 hr tablet Take 2 tablets (20 mg total) by mouth daily.  180 tablet  1  . lisinopril (PRINIVIL,ZESTRIL) 2.5 MG tablet Take 1 tablet (2.5 mg total) by mouth daily.  30 tablet  11  . Multiple Vitamin (MULITIVITAMIN WITH MINERALS) TABS Take 1 tablet by mouth every morning.      . sitaGLIPtan-metformin (JANUMET) 50-1000 MG per tablet Take 1  tablet by mouth 2 (two) times daily with a meal.  180 tablet  1  . spironolactone (ALDACTONE) 25 MG tablet Take 1 tablet (25 mg total) by mouth daily.  30 tablet  11  . DISCONTD: cetirizine (ZYRTEC) 10 MG tablet Take 1 tablet (10 mg total) by mouth daily.  30 tablet  3  . DISCONTD: ferrous sulfate 325 (65 FE) MG EC tablet Take 1 tablet (325 mg total) by mouth 2 (two) times daily.  60 tablet  5  . DISCONTD: FLUoxetine (PROZAC) 10 MG tablet Take 1 tablet (10 mg total) by mouth daily. Take one capsule by mouth once a day  30 tablet  3  . DISCONTD: fluticasone (FLONASE) 50 MCG/ACT nasal spray Place 2 sprays into the nose daily.  16 g  3     Past Medical History  Diagnosis Date  . 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 Hyperlipidemia   . Anxiety   . Aortic stenosis 2009    Echocardiogram 2009-normal EF, mild AS; negative stress nuclear in 2004; negative carotid duplex study in 2009; echo 10/2011-mild LVH, AS-IS-apical akinesis with EF of 20-25%,  mild to mod. AS  . Pneumonia 10/2011    Hospitalized  . Palpitations     freq. PVCs  . Cardiomyopathy 10/2011    EF of 20-25% on echo in 10/2011  . Systolic dysfunction 123456  severe  . Heart murmur   . Shortness of breath   . Diabetes mellitus, type 2     insulin dependent  . Arthritis     in knee    ROS:   All systems reviewed and negative except as noted in the HPI.   Past Surgical History  Procedure Date  . Tubal ligation   . Cesarean section   . Cardiac catheterization 12/05/2011     Family History  Problem Relation Age of Onset  . Arthritis Other     Family History  . Heart disease Mother   . Heart attack Mother   . Diabetes Mother   . Hypertension Mother   . Cancer Father     colon  . Cancer Sister     breast  . Diabetes Brother   . Hypertension Brother   . Stroke Brother      History   Social History  . Marital Status: Married    Spouse Name: N/A    Number of  Children: 9  . Years of Education: N/A   Occupational History  . Employed    Social History Main Topics  . Smoking status: Never Smoker   . Smokeless tobacco: Never Used  . Alcohol Use: No  . Drug Use: No  . Sexually Active: No   Other Topics Concern  . Not on file   Social History Narrative  . No narrative on file     BP 118/70  Pulse 85  Ht 5\' 2"  (1.575 m)  Wt 175 lb (79.379 kg)  BMI 32.01 kg/m2  Physical Exam:  Well appearing middle-aged woman, NAD HEENT: Unremarkable Neck:  7 cm JVD, no thyromegally Lungs:  Clear with no wheezes, rales, or rhonchi. HEART:  Regular rate rhythm, no murmurs, no rubs, no clicks Abd:  soft, positive bowel sounds, no organomegally, no rebound, no guarding Ext:  2 plus pulses, no edema, no cyanosis, no clubbing Skin:  No rashes no nodules Neuro:  CN II through XII intact, motor grossly intact  EKG Normal sinus rhythm with left bundle branch block  Assess/Plan:

## 2012-04-17 NOTE — Assessment & Plan Note (Signed)
Her symptoms are currently class II. We discussed the treatment options which would include biventricular ICD versus biventricular pacemaker versus watchful waiting. The patient at this time is not interested in proceeding with defibrillator or pacemaker insertion. I discussed the risk of sudden death and worsening heart failure. Despite my discussions and recommendation to proceed with device implantation, she would like to undergo watchful waiting. She is instructed to maintain a low-sodium diet and continue her maximal heart failure medications.

## 2012-04-17 NOTE — Assessment & Plan Note (Signed)
It appears that the patient does not have surgical disease. Her aortic stenosis will have to be followed.

## 2012-04-27 ENCOUNTER — Other Ambulatory Visit: Payer: Self-pay | Admitting: *Deleted

## 2012-04-27 ENCOUNTER — Encounter: Payer: Self-pay | Admitting: *Deleted

## 2012-04-27 DIAGNOSIS — E782 Mixed hyperlipidemia: Secondary | ICD-10-CM

## 2012-05-03 ENCOUNTER — Ambulatory Visit (INDEPENDENT_AMBULATORY_CARE_PROVIDER_SITE_OTHER): Payer: Medicare Other | Admitting: Internal Medicine

## 2012-05-16 ENCOUNTER — Telehealth (INDEPENDENT_AMBULATORY_CARE_PROVIDER_SITE_OTHER): Payer: Self-pay | Admitting: *Deleted

## 2012-05-16 ENCOUNTER — Encounter (INDEPENDENT_AMBULATORY_CARE_PROVIDER_SITE_OTHER): Payer: Self-pay | Admitting: Internal Medicine

## 2012-05-16 ENCOUNTER — Ambulatory Visit (INDEPENDENT_AMBULATORY_CARE_PROVIDER_SITE_OTHER): Payer: Medicare Other | Admitting: Internal Medicine

## 2012-05-16 ENCOUNTER — Other Ambulatory Visit (INDEPENDENT_AMBULATORY_CARE_PROVIDER_SITE_OTHER): Payer: Self-pay | Admitting: *Deleted

## 2012-05-16 VITALS — BP 132/66 | HR 68 | Temp 98.8°F | Ht 67.0 in | Wt 176.5 lb

## 2012-05-16 DIAGNOSIS — Z1211 Encounter for screening for malignant neoplasm of colon: Secondary | ICD-10-CM

## 2012-05-16 DIAGNOSIS — D649 Anemia, unspecified: Secondary | ICD-10-CM

## 2012-05-16 DIAGNOSIS — K921 Melena: Secondary | ICD-10-CM

## 2012-05-16 DIAGNOSIS — R195 Other fecal abnormalities: Secondary | ICD-10-CM

## 2012-05-16 MED ORDER — PEG-KCL-NACL-NASULF-NA ASC-C 100 G PO SOLR: 1.0000 | Freq: Once | ORAL | Status: DC

## 2012-05-16 NOTE — Telephone Encounter (Signed)
Patient needs movi prep 

## 2012-05-16 NOTE — Patient Instructions (Addendum)
Colonoscopy

## 2012-05-16 NOTE — Progress Notes (Signed)
Subjective:     Patient ID: [name] [name], female   DOB: 1946-01-15, 66 y.o.   MRN: [id number]  HPI  Referred to our office for anemia.She tells me she has been anemic for about 10 yrs. 12/07/2011 H and H 11.9 and 35.1, MCV 72.4 Appetite is good. Weight loss which was intentional due to her CHF. She changed her way of eating. No abdominal pain. She has a BM about once a day. No melena or bright red rectal bleeding.  No family hx of colon cancer.  Hx of Pulmonary Edeam and respiratory failure.ultimately on vent. In April 2013. CBC    Component Value Date/Time   WBC 8.1 12/07/2011 0448   RBC 4.85 12/07/2011 0448   HGB 11.9* 12/07/2011 0448   HCT 35.1* 12/07/2011 0448   PLT 265 12/07/2011 0448   MCV 72.4* 12/07/2011 0448   MCH 24.5* 12/07/2011 0448   MCHC 33.9 12/07/2011 0448   RDW 16.6* 12/07/2011 0448   LYMPHSABS 3.3 11/30/2011 2317   MONOABS 0.7 11/30/2011 2317   EOSABS 0.2 11/30/2011 2317   BASOSABS 0.1 11/30/2011 2317       Review of Systems see hpi Current Outpatient Prescriptions  Medication Sig Dispense Refill  . albuterol (PROVENTIL HFA;VENTOLIN HFA) 108 (90 BASE) MCG/ACT inhaler Inhale 2 puffs into the lungs every 6 (six) hours as needed. For wheezing  18 g  1  . atorvastatin (LIPITOR) 40 MG tablet Take 1 tablet (40 mg total) by mouth daily.  30 tablet  11  . carvedilol (COREG) 12.5 MG tablet Take 1 tablet (12.5 mg total) by mouth 2 (two) times daily with a meal.  60 tablet  11  . fenofibrate (TRICOR) 145 MG tablet Take 145 mg by mouth daily.      . furosemide (LASIX) 40 MG tablet Take 1 tablet (40 mg total) by mouth daily.  30 tablet  11  . glipiZIDE (GLUCOTROL XL) 10 MG 24 hr tablet Take 2 tablets (20 mg total) by mouth daily.  180 tablet  1  . lisinopril (PRINIVIL,ZESTRIL) 2.5 MG tablet Take 1 tablet (2.5 mg total) by mouth daily.  30 tablet  11  . sitaGLIPtan-metformin (JANUMET) 50-1000 MG per tablet Take 1 tablet by mouth 2 (two) times daily with a meal.  180 tablet  1  .  spironolactone (ALDACTONE) 25 MG tablet Take 1 tablet (25 mg total) by mouth daily.  30 tablet  11  . Multiple Vitamin (MULITIVITAMIN WITH MINERALS) TABS Take 1 tablet by mouth every morning.      Marland Kitchen DISCONTD: cetirizine (ZYRTEC) 10 MG tablet Take 1 tablet (10 mg total) by mouth daily.  30 tablet  3  . DISCONTD: ferrous sulfate 325 (65 FE) MG EC tablet Take 1 tablet (325 mg total) by mouth 2 (two) times daily.  60 tablet  5  . DISCONTD: FLUoxetine (PROZAC) 10 MG tablet Take 1 tablet (10 mg total) by mouth daily. Take one capsule by mouth once a day  30 tablet  3  . DISCONTD: fluticasone (FLONASE) 50 MCG/ACT nasal spray Place 2 sprays into the nose daily.  16 g  3   Past Medical History  Diagnosis Date  . 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 Hyperlipidemia   . Anxiety   . Aortic stenosis 2009    Echocardiogram 2009-normal EF, mild AS; negative stress nuclear in 2004; negative carotid duplex study in 2009; echo 10/2011-mild LVH, AS-IS-apical akinesis with EF  of 20-25%,  mild to mod. AS  . Pneumonia 10/2011    Hospitalized  . Palpitations     freq. PVCs  . Cardiomyopathy 10/2011    EF of 20-25% on echo in 10/2011  . Systolic dysfunction 123456    severe  . Heart murmur   . Shortness of breath   . Diabetes mellitus, type 2     insulin dependent  . Arthritis     in knee   Past Surgical History  Procedure Date  . Tubal ligation   . Cesarean section   . Cardiac catheterization 12/05/2011   History   Social History  . Marital Status: Married    Spouse Name: N/A    Number of Children: 6  . Years of Education: N/A   Occupational History  . Employed    Social History Main Topics  . Smoking status: Never Smoker   . Smokeless tobacco: Never Used  . Alcohol Use: No  . Drug Use: No  . Sexually Active: No   Other Topics Concern  . Not on file   Social History Narrative  . No narrative on file   Family Status  Relation Status Death Age    . Mother Deceased   . Father Deceased   . Sister Deceased   . Brother Alive   . Brother Deceased     X5   Allergies  Allergen Reactions  . Penicillins     Family unsure of reaction, but certain mom said she was allergic        Objective:   Physical Exam  Filed Vitals:   05/16/12 1111  BP: 132/66  Pulse: 68  Temp: 98.8 F (37.1 C)  Height: 5\' 7"  (1.702 m)  Weight: 176 lb 8 oz (80.06 kg)  Alert and oriented. Skin warm and dry. Oral mucosa is moist.   . Sclera anicteric, conjunctivae is pink. Thyroid not enlarged. No cervical lymphadenopathy. Lungs clear. Heart regular rate and rhythm. Loud murmur heard.   Abdomen is soft. Bowel sounds are positive. No hepatomegaly. No abdominal masses felt. No tenderness.  No edema to lower extremities. Stool brown and guaiac positive.      Assessment:    Anemia. Guaiac positive stool. Colonic neoplasm needs to be ruled out. AVM, ulcer, polyp in the differential.    Plan:   Colnooscopy, with Dr. Laural Golden.

## 2012-05-23 ENCOUNTER — Encounter (INDEPENDENT_AMBULATORY_CARE_PROVIDER_SITE_OTHER): Payer: Self-pay | Admitting: *Deleted

## 2012-05-23 ENCOUNTER — Encounter (HOSPITAL_COMMUNITY): Payer: Self-pay | Admitting: Pharmacy Technician

## 2012-05-23 NOTE — Telephone Encounter (Signed)
This encounter was created in error - please disregard.

## 2012-06-06 ENCOUNTER — Ambulatory Visit: Payer: Medicare Other | Admitting: Cardiology

## 2012-06-08 ENCOUNTER — Ambulatory Visit: Payer: Medicare Other | Admitting: Cardiology

## 2012-06-20 ENCOUNTER — Ambulatory Visit (INDEPENDENT_AMBULATORY_CARE_PROVIDER_SITE_OTHER): Payer: Medicare Other | Admitting: Physician Assistant

## 2012-06-20 ENCOUNTER — Encounter: Payer: Self-pay | Admitting: Physician Assistant

## 2012-06-20 VITALS — BP 113/65 | HR 91 | Ht 67.0 in | Wt 178.0 lb

## 2012-06-20 DIAGNOSIS — I428 Other cardiomyopathies: Secondary | ICD-10-CM

## 2012-06-20 DIAGNOSIS — I5022 Chronic systolic (congestive) heart failure: Secondary | ICD-10-CM

## 2012-06-20 DIAGNOSIS — I10 Essential (primary) hypertension: Secondary | ICD-10-CM

## 2012-06-20 DIAGNOSIS — I429 Cardiomyopathy, unspecified: Secondary | ICD-10-CM

## 2012-06-20 DIAGNOSIS — I350 Nonrheumatic aortic (valve) stenosis: Secondary | ICD-10-CM

## 2012-06-20 DIAGNOSIS — I359 Nonrheumatic aortic valve disorder, unspecified: Secondary | ICD-10-CM

## 2012-06-20 MED ORDER — CARVEDILOL 25 MG PO TABS: 25.0000 mg | ORAL_TABLET | Freq: Two times a day (BID) | ORAL | Status: DC

## 2012-06-20 NOTE — Progress Notes (Signed)
HPI:  This is a 66 year old [place] American female patient who is here for routine followup. She is followed by Dr. Leander Rams and Dr. Crissie Sickles. She has a history of nonischemic cardiomyopathy and saw [name] on 04/17/12 for consideration of ICD implantation. Most recent 2-D echo demonstrated an ejection fraction of 25%. She also has moderate mitral regurgitation and aortic stenosis. The patient did not want to proceed with defibrillator or pacemaker insertion at that time.  The patient comes in today for routine followup. Overall she is doing well without chest pain, significant dyspnea, or weight gain. She feels like she is getting a cold and she's been coughing and sneezing a lot. She says she weighs herself daily and since her weight then to be followed. She doesn't really understand a low-sodium diet is usually been using small salt packets thinking this was the way to watch her sodium intake.  Allergies: - Penicillins    --  Family unsure of reaction, but certain mom said            she was allergic  Current Outpatient Prescriptions on File Prior to Visit: albuterol (PROVENTIL HFA;VENTOLIN HFA) 108 (90 BASE) MCG/ACT inhaler, Inhale 2 puffs into the lungs every 6 (six) hours as needed. For wheezing, Disp: 18 g, Rfl: 1 atorvastatin (LIPITOR) 40 MG tablet, Take 1 tablet (40 mg total) by mouth daily., Disp: 30 tablet, Rfl: 11 fenofibrate (TRICOR) 145 MG tablet, Take 145 mg by mouth daily., Disp: , Rfl:  furosemide (LASIX) 40 MG tablet, Take 1 tablet (40 mg total) by mouth daily., Disp: 30 tablet, Rfl: 11 glipiZIDE (GLUCOTROL XL) 10 MG 24 hr tablet, Take 2 tablets (20 mg total) by mouth daily., Disp: 180 tablet, Rfl: 1 lisinopril (PRINIVIL,ZESTRIL) 2.5 MG tablet, Take 1 tablet (2.5 mg total) by mouth daily., Disp: 30 tablet, Rfl: 11 Multiple Vitamin (MULITIVITAMIN WITH MINERALS) TABS, Take 1 tablet by mouth every morning., Disp: , Rfl:  sitaGLIPtan-metformin (JANUMET) 50-1000 MG per tablet,  Take 1 tablet by mouth 2 (two) times daily with a meal., Disp: 180 tablet, Rfl: 1 spironolactone (ALDACTONE) 25 MG tablet, Take 1 tablet (25 mg total) by mouth daily., Disp: 30 tablet, Rfl: 11 [DISCONTINUED] carvedilol (COREG) 12.5 MG tablet, Take 1 tablet (12.5 mg total) by mouth 2 (two) times daily with a meal., Disp: 60 tablet, Rfl: 11 [DISCONTINUED] cetirizine (ZYRTEC) 10 MG tablet, Take 1 tablet (10 mg total) by mouth daily., Disp: 30 tablet, Rfl: 3 [DISCONTINUED] ferrous sulfate 325 (65 FE) MG EC tablet, Take 1 tablet (325 mg total) by mouth 2 (two) times daily., Disp: 60 tablet, Rfl: 5 [DISCONTINUED] FLUoxetine (PROZAC) 10 MG tablet, Take 1 tablet (10 mg total) by mouth daily. Take one capsule by mouth once a day, Disp: 30 tablet, Rfl: 3 [DISCONTINUED] fluticasone (FLONASE) 50 MCG/ACT nasal spray, Place 2 sprays into the nose daily., Disp: 16 g, Rfl: 3    Past Medical History:   Hypertension                                                   Comment:Abnormal myoview in 2004-fixed ant. defect;               class 2 DOE   Hyperlipidemia  Anxiety                                                      Aortic stenosis                                 2009           Comment:Echocardiogram 2009-normal EF, mild AS;               negative stress nuclear in 2004; negative               carotid duplex study in 2009; echo 10/2011-mild               LVH, AS-IS-apical akinesis with EF of 20-25%,                mild to mod. AS   Pneumonia                                       10/2011         Comment:Hospitalized   Palpitations                                                   Comment:freq. PVCs   Cardiomyopathy                                  10/2011         Comment:EF of 20-25% on echo in [id number]   Systolic dysfunction                            12/05/2011      Comment:severe   Heart murmur                                                 Shortness of  breath                                          Diabetes mellitus, type 2                                      Comment:insulin dependent   Arthritis                                                      Comment:in knee  Past Surgical History:   TUBAL LIGATION  CESAREAN SECTION                                             CARDIAC CATHETERIZATION                         12/05/2011   Review of patient's family history indicates:   Arthritis                      Other                      Comment: Family History   Heart disease                  Mother                   Heart attack                   Mother                   Diabetes                       Mother                   Hypertension                   Mother                   Cancer                         Father                     Comment: colon   Cancer                         Sister                     Comment: breast   Diabetes                       Brother                  Hypertension                   Brother                  Stroke                         Brother                  Social History   Marital Status: Married             Spouse Name:                      Years of Education:                 Number of children: 5           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 Neck: No JVD, HJR, Bruit, or thyroid enlargement  Lungs: No tachypnea, clear without wheezing,  rales, or rhonchi  Cardiovascular: RRR, PMI not displaced, [id number] harsh systolic murmur at the left sternal border, 2/6 systolic murmur at the apex, no gallops, bruit, thrill, or heave.  Abdomen: BS normal. Soft without organomegaly, masses, lesions or tenderness.  Extremities: without cyanosis, clubbing or edema. Good distal pulses bilateral  SKin: Warm, no lesions or rashes   Musculoskeletal: No deformities  Neuro: no focal signs  BP 113/65  Pulse 91  Ht 5\' 7"  (1.702 m)  Wt 178 lb 0.6 oz (80.758 kg)  BMI 27.88 kg/m2  SpO2 98%   [id number] sinus rhythm with left bundle branch block

## 2012-06-20 NOTE — Patient Instructions (Addendum)
Increase your Carvedilol to 25mg  1 tablet twice a day  Continue to weigh yourself daily. Each morning before breakfast. Write your weight down.   If your weight goes up 2 pounds overnight or 3 pounds within a week please give our office a call.  You are due to see [name] in January. You can make that appointment today.  2 Gram Low Sodium Diet  A 2 gram sodium diet restricts the amount of sodium in the diet to no more than 2 g or 2000 mg daily. Limiting the amount of sodium is often used to help lower blood pressure.    Do not add salt to food.  Avoid convenience items and fast food.  Choose unsalted snack foods.  Buy lower sodium products, often labeled as "lower sodium" or "no salt added."  Check food labels to learn how much sodium is in 1 serving.  When eating at a restaurant, ask that your food be prepared with less salt or none, if possible.   CHOOSING FOODS Grains  Avoid: Salted crackers and snack items. Some cereals, including instant hot cereals. Bread stuffing and biscuit mixes. Seasoned rice or pasta mixes.  Choose: Unsalted snack items. Low-sodium cereals, oats, puffed wheat and rice, shredded wheat. English muffins and bread. Pasta. Meats  Avoid: Salted, canned, smoked, spiced, pickled meats, including fish and poultry. Bacon, ham, sausage, cold cuts, hot dogs, anchovies.  Choose: Low-sodium canned tuna and salmon. Fresh or frozen meat, poultry, and fish. Dairy  Avoid: Processed cheese and spreads. Cottage cheese. Buttermilk and condensed milk. Regular cheese.  Choose: Milk. Low-sodium cottage cheese. Yogurt. Sour cream. Low-sodium cheese. Fruits and Vegetables  Avoid: Regular canned vegetables. Regular canned tomato sauce and paste. Frozen vegetables in sauces. Olives. Angie Fava. Relishes. Sauerkraut.  Choose: Low-sodium canned vegetables. Low-sodium tomato sauce and paste. Frozen or fresh vegetables. Fresh and frozen fruit. Condiments  Avoid: Canned  and packaged gravies. Worcestershire sauce. Tartar sauce. Barbecue sauce. Soy sauce. Steak sauce. Ketchup. Onion, garlic, and table salt. Meat flavorings and tenderizers.  Choose: Fresh and dried herbs and spices. Low-sodium varieties of mustard and ketchup. Lemon juice. Tabasco sauce. Horseradish. SAMPLE 2 GRAM SODIUM MEAL PLAN Breakfast / Sodium (mg)  1 cup low-fat milk / [id number] mg  2 slices whole-wheat toast / 270 mg  1 tbs heart-healthy margarine / 153 mg  1 hard-boiled egg / 139 mg  1 small orange / 0 mg Lunch / Sodium (mg)  1 cup raw carrots / 76 mg   cup hummus / 298 mg  1 cup low-fat milk / 143 mg   cup red grapes / 2 mg  1 whole-wheat pita bread / 356 mg Dinner / Sodium (mg)  1 cup whole-wheat pasta / 2 mg  1 cup low-sodium tomato sauce / 73 mg  3 oz lean ground beef / 57 mg  1 small side salad (1 cup raw spinach leaves,  cup cucumber,  cup yellow bell pepper) with 1 tsp olive oil and 1 tsp red wine vinegar / 25 mg Snack / Sodium (mg)  1 container low-fat vanilla yogurt / 107 mg  3 graham cracker squares / 127 mg Nutrient Analysis  Calories: 2033  Protein: 77 g  Carbohydrate: 282 g  Fat: 72 g  Sodium: 1971 mg Document Released: 08/01/2005 Document Revised: 10/24/2011 Document Reviewed: 11/02/2009 [facility] Patient Information 2013 [place].

## 2012-06-20 NOTE — Assessment & Plan Note (Signed)
Patient's heart failure is stable. No evidence of heart failure on exam today. Her heart rate is up a little bit at 90 beats per minute. I will increase her Coreg to 25 mg b.i.d.I've instructed her in 2 g sodium diet.

## 2012-06-20 NOTE — Assessment & Plan Note (Signed)
Stable

## 2012-06-20 NOTE — Assessment & Plan Note (Signed)
Scheduled to see [name] back in January

## 2012-07-06 ENCOUNTER — Encounter (HOSPITAL_COMMUNITY): Admission: RE | Payer: Self-pay | Source: Ambulatory Visit

## 2012-07-06 ENCOUNTER — Ambulatory Visit (HOSPITAL_COMMUNITY): Admission: RE | Admit: 2012-07-06 | Payer: Medicare Other | Source: Ambulatory Visit | Admitting: Internal Medicine

## 2012-07-06 SURGERY — COLONOSCOPY
Anesthesia: Moderate Sedation

## 2012-07-10 ENCOUNTER — Encounter: Payer: Self-pay | Admitting: Family Medicine

## 2012-07-25 ENCOUNTER — Other Ambulatory Visit: Payer: Self-pay | Admitting: Family Medicine

## 2012-07-26 ENCOUNTER — Other Ambulatory Visit (HOSPITAL_COMMUNITY)
Admission: RE | Admit: 2012-07-26 | Discharge: 2012-07-26 | Disposition: A | Discharge status: Home / Self Care | Payer: Medicare Other | Source: Ambulatory Visit | Attending: Family Medicine | Admitting: Family Medicine

## 2012-07-26 ENCOUNTER — Ambulatory Visit (INDEPENDENT_AMBULATORY_CARE_PROVIDER_SITE_OTHER): Payer: Medicare Other | Admitting: Family Medicine

## 2012-07-26 ENCOUNTER — Encounter: Payer: Self-pay | Admitting: Family Medicine

## 2012-07-26 VITALS — BP 122/70 | HR 99 | Resp 18 | Ht 65.25 in | Wt 176.1 lb

## 2012-07-26 DIAGNOSIS — Z01419 Encounter for gynecological examination (general) (routine) without abnormal findings: Secondary | ICD-10-CM | POA: Insufficient documentation

## 2012-07-26 DIAGNOSIS — I5022 Chronic systolic (congestive) heart failure: Secondary | ICD-10-CM

## 2012-07-26 DIAGNOSIS — Z1151 Encounter for screening for human papillomavirus (HPV): Secondary | ICD-10-CM | POA: Insufficient documentation

## 2012-07-26 DIAGNOSIS — Z1211 Encounter for screening for malignant neoplasm of colon: Secondary | ICD-10-CM

## 2012-07-26 DIAGNOSIS — Z0000 Encounter for general adult medical examination without abnormal findings: Secondary | ICD-10-CM

## 2012-07-26 DIAGNOSIS — G4700 Insomnia, unspecified: Secondary | ICD-10-CM

## 2012-07-26 DIAGNOSIS — E785 Hyperlipidemia, unspecified: Secondary | ICD-10-CM

## 2012-07-26 DIAGNOSIS — E119 Type 2 diabetes mellitus without complications: Secondary | ICD-10-CM

## 2012-07-26 LAB — POC HEMOCCULT BLD/STL (OFFICE/1-CARD/DIAGNOSTIC): Fecal Occult Blood, POC: NEGATIVE

## 2012-07-26 LAB — FECAL OCCULT BLOOD, GUAIAC: Fecal Occult Blood: NEGATIVE

## 2012-07-26 MED ORDER — TEMAZEPAM 15 MG PO CAPS: 15.0000 mg | ORAL_CAPSULE | Freq: Every evening | ORAL | Status: DC | PRN

## 2012-07-26 NOTE — Patient Instructions (Addendum)
F/u  In 4 months, please call if you need me before.  New medication to help with sleep, please also go to bed at a set time with all noise and light off. \ HBa1C, cmp and EGFr  in 4 month, non fast.Microalb today  Medication for blood sugar will be adjusted based on labs.  PLEASE get the flu vaccine at the pharmacy today

## 2012-07-26 NOTE — Progress Notes (Signed)
  Subjective:    Patient ID: [name] [name], female    DOB: [birth year], 66 y.o.   MRN: [id number]  HPI Pt in for pelvic and breast exam, as well as review of her diabetes. States she has episodes of low sugar espescialy at night, at least 3 days per week. States her appetite is poor, feels stressed caring for her husband who has cancer. C/o poor sleep. Denies symptoms of depression but remain anxious , also tearfully recalls how much she misses her daughter who died Denies polyuria, polydipsia or blurred vision   Review of Systems See HPI Denies recent fever or chills. Denies sinus pressure, nasal congestion, ear pain or sore throat. Denies chest congestion, productive cough or wheezing. Denies chest pains, palpitations and leg swelling, she is careful with regard to measuring intake and salt restriction Denies abdominal pain, nausea, vomiting,diarrhea or constipation.   Denies dysuria, frequency, hesitancy or incontinence. Denies joint pain, swelling and limitation in mobility. Denies headaches, seizures, numbness, or tingling.  Denies skin break down or rash.        Objective:   Physical Exam  Pleasant well nourished female, alert and oriented x 3, in no cardio-pulmonary distress. Afebrile. HEENT No facial trauma or asymetry. Sinuses non tender.  EOMI, PERTL, fundoscopic exam  no hemorhage or exudate.  External ears normal, tympanic membranes clear. Oropharynx moist, no exudate, fair dentition. Neck: supple, no adenopathy,JVD or thyromegaly.No bruits.  Chest: Clear to ascultation bilaterally.No crackles or wheezes. Non tender to palpation  Breast: No asymetry,no masses. No nipple discharge or inversion.No skin lesions No axillary or supraclavicular adenopathy  Cardiovascular system; Heart sounds normal,  S1 and  S2 ,no S3.  No murmur, or thrill. Apical beat not displaced Peripheral pulses normal.  Abdomen: Soft, non tender, no organomegaly or masses. No  bruits. Bowel sounds normal. No guarding, tenderness or rebound.  Rectal:  No mass. Guaiac negative stool.  GU: External genitalia normal. No lesions.Female distribution of hair Vaginal canal normal.physiologic, white  discharge. Uterus normal size, no adnexal masses, no cervical motion or adnexal tenderness.  Musculoskeletal exam: Full ROM of spine, hips , shoulders and knees. No deformity ,swelling or crepitus noted. No muscle wasting or atrophy.   Neurologic: Cranial nerves 2 to 12 intact. Power, tone ,sensation and reflexes normal throughout. No disturbance in gait. No tremor.  Skin: Intact, no ulceration, erythema , scaling or rash noted. Pigmentation normal throughout  Psych; Normal mood and affect. Judgement and concentration normal       Assessment & Plan:

## 2012-07-27 LAB — LIPID PANEL
Cholesterol: 213 mg/dL — ABNORMAL HIGH (ref 0–200)
HDL: 51 mg/dL (ref >39)
LDL Cholesterol: 143 mg/dL — ABNORMAL HIGH (ref 0–99)
Total CHOL/HDL Ratio: 4.2 Ratio
Triglycerides: 97 mg/dL (ref <150)
VLDL: 19 mg/dL (ref 0–40)

## 2012-07-27 LAB — COMPLETE METABOLIC PANEL WITH GFR
ALT: 16 U/L (ref 0–53)
AST: 17 U/L (ref 0–37)
Albumin: 4.3 g/dL (ref 3.5–5.2)
Alkaline Phosphatase: 24 U/L — ABNORMAL LOW (ref 39–117)
BUN: 28 mg/dL — ABNORMAL HIGH (ref 6–23)
CO2: 26 mEq/L (ref 19–32)
Calcium: 9.8 mg/dL (ref 8.4–10.5)
Chloride: 110 mEq/L (ref 96–112)
Creat: 1.48 mg/dL — ABNORMAL HIGH (ref 0.50–1.35)
GFR, Est African American: 63 mL/min
GFR, Est Non African American: 55 mL/min — ABNORMAL LOW
Glucose, Bld: 89 mg/dL (ref 70–99)
Potassium: 4.2 mEq/L (ref 3.5–5.3)
Sodium: 141 mEq/L (ref 135–145)
Total Bilirubin: 0.3 mg/dL (ref 0.3–1.2)
Total Protein: 7 g/dL (ref 6.0–8.3)

## 2012-07-27 LAB — HEMOGLOBIN A1C
Hgb A1c MFr Bld: 6.7 % — ABNORMAL HIGH (ref <5.7)
Mean Plasma Glucose: 146 mg/dL — ABNORMAL HIGH (ref <117)

## 2012-07-27 LAB — MICROALBUMIN / CREATININE URINE RATIO
Creatinine, Urine: 92.4 mg/dL
Microalb Creat Ratio: 5.4 mg/g (ref 0.0–30.0)
Microalb, Ur: 0.5 mg/dL (ref 0.00–1.89)

## 2012-07-27 MED ORDER — FENOFIBRATE 145 MG PO TABS: 145.0000 mg | ORAL_TABLET | Freq: Every day | ORAL | Status: DC

## 2012-07-28 ENCOUNTER — Telehealth: Payer: Self-pay | Admitting: Family Medicine

## 2012-07-28 DIAGNOSIS — Z0000 Encounter for general adult medical examination without abnormal findings: Secondary | ICD-10-CM | POA: Insufficient documentation

## 2012-07-28 NOTE — Assessment & Plan Note (Signed)
Sleep hygiene reviewed, restoril to be continued

## 2012-07-28 NOTE — Assessment & Plan Note (Addendum)
Controlled,but c/o intermittent hypoglycemia esp at night , reduce glipizide to 15 mg daily Patient advised to reduce carb and sweets, commit to regular physical activity, take meds as prescribed, test blood as directed, and attempt to lose weight, to improve blood sugar control.

## 2012-07-28 NOTE — Assessment & Plan Note (Signed)
Pelvic and breast exam performed and documented. Pap sent, rectal normal , heme negative stool Importance of restricting sodium and water for heart failure management stessed. Following a low carb , low fat diet for good health discussed, as well as blood sugar control. Need to get flu vaccine

## 2012-07-28 NOTE — Telephone Encounter (Signed)
Pls contact pt, let her know blood sugar med is reduced from glipizide 10mg  two daily to 5mg  two in the morning and one at supper she was having hypoglycemia and she is over corrected. New dose is entered pls send after you speak with her

## 2012-07-28 NOTE — Assessment & Plan Note (Signed)
Uncontrolled dose inc in lipitor and low fat diet discussed and stressed

## 2012-07-29 NOTE — Progress Notes (Signed)
  Subjective:    Patient ID: [name] [name], female    DOB: 07-15-1946, 66 y.o.   MRN: [id number]  HPI    Review of Systems     Objective:   Physical Exam        Assessment & Plan:

## 2012-07-31 ENCOUNTER — Other Ambulatory Visit: Payer: Self-pay

## 2012-07-31 DIAGNOSIS — E119 Type 2 diabetes mellitus without complications: Secondary | ICD-10-CM

## 2012-07-31 MED ORDER — GLIPIZIDE ER 5 MG PO TB24: ORAL_TABLET | ORAL | Status: DC

## 2012-08-01 ENCOUNTER — Other Ambulatory Visit: Payer: Self-pay

## 2012-08-01 DIAGNOSIS — E785 Hyperlipidemia, unspecified: Secondary | ICD-10-CM

## 2012-08-01 MED ORDER — ATORVASTATIN CALCIUM 80 MG PO TABS: 80.0000 mg | ORAL_TABLET | Freq: Every day | ORAL | Status: DC

## 2012-09-20 ENCOUNTER — Encounter: Payer: Self-pay | Admitting: Internal Medicine

## 2012-09-20 ENCOUNTER — Ambulatory Visit (INDEPENDENT_AMBULATORY_CARE_PROVIDER_SITE_OTHER): Payer: Medicare Other | Admitting: Internal Medicine

## 2012-09-20 VITALS — BP 110/70 | HR 76 | Ht 65.25 in | Wt 176.0 lb

## 2012-09-20 DIAGNOSIS — I10 Essential (primary) hypertension: Secondary | ICD-10-CM

## 2012-09-20 DIAGNOSIS — I5022 Chronic systolic (congestive) heart failure: Secondary | ICD-10-CM

## 2012-09-20 NOTE — Assessment & Plan Note (Signed)
Once again I discussed the treatment options with the patient. The risk, goals, benefits, and expectations of biventricular ICD implantation a been discussed with the patient. She is considering her options and will notify us if she would like to proceed with device implantation. At the present time, she is uncertain.

## 2012-09-20 NOTE — Assessment & Plan Note (Signed)
Today her blood pressure is well controlled. She will continue her current medical therapy, and maintain a low-sodium diet.

## 2012-09-20 NOTE — Progress Notes (Signed)
HPI [name] [name] returns today for followup. She is a 67 year old woman with an ischemic cardiomyopathy, left bundle branch block, and chronic class II congestive heart failure. I saw the patient several months ago and at that time recommended insertion of a biventricular ICD for primary prevention of malignant ventricular arrhythmias. Initially, she was not interested in pursuing ICD implantation. She returns today for followup. She denies chest pain, syncope, or peripheral edema. She has class II heart failure symptoms. Allergies  Allergen Reactions  . Penicillins     Family unsure of reaction, but certain mom said she was allergic     Current Outpatient Prescriptions  Medication Sig Dispense Refill  . albuterol (PROVENTIL HFA;VENTOLIN HFA) 108 (90 BASE) MCG/ACT inhaler Inhale 2 puffs into the lungs every 6 (six) hours as needed. For wheezing  18 g  1  . atorvastatin (LIPITOR) 80 MG tablet Take 1 tablet (80 mg total) by mouth daily. Dose increase effective 07/27/2012. Discontinue lipitor 40mg   30 tablet  11  . carvedilol (COREG) 25 MG tablet Take 1 tablet (25 mg total) by mouth 2 (two) times daily with a meal.  60 tablet  11  . fenofibrate (TRICOR) 145 MG tablet Take 1 tablet (145 mg total) by mouth daily.  30 tablet  4  . furosemide (LASIX) 40 MG tablet Take 1 tablet (40 mg total) by mouth daily.  30 tablet  11  . glipiZIDE (GLUCOTROL XL) 5 MG 24 hr tablet Two tablets with breakfast, and one tablet at supper. Discontinue glipizide 10mg  tablets  90 tablet  5  . lisinopril (PRINIVIL,ZESTRIL) 2.5 MG tablet Take 1 tablet (2.5 mg total) by mouth daily.  30 tablet  11  . Multiple Vitamin (MULITIVITAMIN WITH MINERALS) TABS Take 1 tablet by mouth every morning.      . sitaGLIPtan-metformin (JANUMET) 50-1000 MG per tablet Take 1 tablet by mouth 2 (two) times daily with a meal.  180 tablet  1  . spironolactone (ALDACTONE) 25 MG tablet Take 1 tablet (25 mg total) by mouth daily.  30 tablet  11  .  temazepam (RESTORIL) 15 MG capsule Take 1 capsule (15 mg total) by mouth at bedtime as needed for sleep.  30 capsule  4  . [DISCONTINUED] cetirizine (ZYRTEC) 10 MG tablet Take 1 tablet (10 mg total) by mouth daily.  30 tablet  3  . [DISCONTINUED] ferrous sulfate 325 (65 FE) MG EC tablet Take 1 tablet (325 mg total) by mouth 2 (two) times daily.  60 tablet  5  . [DISCONTINUED] FLUoxetine (PROZAC) 10 MG tablet Take 1 tablet (10 mg total) by mouth daily. Take one capsule by mouth once a day  30 tablet  3  . [DISCONTINUED] fluticasone (FLONASE) 50 MCG/ACT nasal spray Place 2 sprays into the nose daily.  16 g  3     Past Medical History  Diagnosis Date  . 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 Hyperlipidemia   . Anxiety   . Aortic stenosis 2009    Echocardiogram 2009-normal EF, mild AS; negative stress nuclear in 2004; negative carotid duplex study in 2009; echo 10/2011-mild LVH, AS-IS-apical akinesis with EF of 20-25%,  mild to mod. AS  . Pneumonia 10/2011    Hospitalized  . Palpitations     freq. PVCs  . Cardiomyopathy 10/2011    EF of 20-25% on echo in 10/2011  . Systolic dysfunction 123456    severe  . Heart murmur   .  Shortness of breath   . Diabetes mellitus, type 2     insulin dependent  . Arthritis     in knee    ROS:   All systems reviewed and negative except as noted in the HPI.   Past Surgical History  Procedure Date  . Tubal ligation   . Cesarean section   . Cardiac catheterization 12/05/2011     Family History  Problem Relation Age of Onset  . Arthritis Other     Family History  . Heart disease Mother   . Heart attack Mother   . Diabetes Mother   . Hypertension Mother   . Cancer Father     colon  . Cancer Sister     breast  . Diabetes Brother   . Hypertension Brother   . Stroke Brother      History   Social History  . Marital Status: Married    Spouse Name: N/A    Number of Children: 51  . Years of  Education: N/A   Occupational History  . Employed    Social History Main Topics  . Smoking status: Never Smoker   . Smokeless tobacco: Never Used  . Alcohol Use: No  . Drug Use: No  . Sexually Active: No   Other Topics Concern  . Not on file   Social History Narrative  . No narrative on file     BP 110/70  Pulse 76  Ht 5' 5.25" (1.657 m)  Wt 176 lb (79.833 kg)  BMI 29.06 kg/m2  SpO2 99%  Physical Exam:  Well appearing 67 year old woman, NAD HEENT: Unremarkable Neck:  7 cm JVD, no thyromegally Lungs:  Clear with no wheezes, rales, or rhonchi. HEART:  Regular rate rhythm, no murmurs, no rubs, no clicks Abd:  soft, positive bowel sounds, no organomegally, no rebound, no guarding Ext:  2 plus pulses, no edema, no cyanosis, no clubbing Skin:  No rashes no nodules Neuro:  CN II through XII intact, motor grossly intact  Assess/Plan:

## 2012-09-25 ENCOUNTER — Telehealth: Payer: Self-pay | Admitting: Family Medicine

## 2012-09-25 MED ORDER — GLUCOSE BLOOD VI STRP: ORAL_STRIP | Status: DC

## 2012-09-25 MED ORDER — ONETOUCH DELICA LANCETS FINE MISC: 1.0000 | Freq: Two times a day (BID) | Status: DC

## 2012-09-25 NOTE — Telephone Encounter (Signed)
Will fax in pt aware

## 2012-10-01 ENCOUNTER — Telehealth: Payer: Self-pay | Admitting: Family Medicine

## 2012-10-01 MED ORDER — ONETOUCH DELICA LANCETS FINE MISC: 1.0000 | Freq: Two times a day (BID) | Status: DC

## 2012-10-01 NOTE — Telephone Encounter (Signed)
SENT IN AGAIN

## 2012-11-07 ENCOUNTER — Telehealth: Payer: Self-pay | Admitting: Family Medicine

## 2012-11-10 ENCOUNTER — Emergency Department (HOSPITAL_COMMUNITY)
Admission: EM | Admit: 2012-11-10 | Discharge: 2012-11-10 | Disposition: A | Discharge status: Home / Self Care | Payer: Medicare Other | Attending: Emergency Medicine | Admitting: Emergency Medicine

## 2012-11-10 ENCOUNTER — Encounter (HOSPITAL_COMMUNITY): Payer: Self-pay | Admitting: *Deleted

## 2012-11-10 DIAGNOSIS — Z8679 Personal history of other diseases of the circulatory system: Secondary | ICD-10-CM | POA: Insufficient documentation

## 2012-11-10 DIAGNOSIS — L02811 Cutaneous abscess of head [any part, except face]: Secondary | ICD-10-CM

## 2012-11-10 DIAGNOSIS — I10 Essential (primary) hypertension: Secondary | ICD-10-CM | POA: Insufficient documentation

## 2012-11-10 DIAGNOSIS — E119 Type 2 diabetes mellitus without complications: Secondary | ICD-10-CM | POA: Insufficient documentation

## 2012-11-10 DIAGNOSIS — R011 Cardiac murmur, unspecified: Secondary | ICD-10-CM | POA: Insufficient documentation

## 2012-11-10 DIAGNOSIS — Z8701 Personal history of pneumonia (recurrent): Secondary | ICD-10-CM | POA: Insufficient documentation

## 2012-11-10 DIAGNOSIS — Z79899 Other long term (current) drug therapy: Secondary | ICD-10-CM | POA: Insufficient documentation

## 2012-11-10 DIAGNOSIS — Z8739 Personal history of other diseases of the musculoskeletal system and connective tissue: Secondary | ICD-10-CM | POA: Insufficient documentation

## 2012-11-10 DIAGNOSIS — L02818 Cutaneous abscess of other sites: Secondary | ICD-10-CM | POA: Insufficient documentation

## 2012-11-10 DIAGNOSIS — Z8659 Personal history of other mental and behavioral disorders: Secondary | ICD-10-CM | POA: Insufficient documentation

## 2012-11-10 DIAGNOSIS — L03818 Cellulitis of other sites: Secondary | ICD-10-CM | POA: Insufficient documentation

## 2012-11-10 DIAGNOSIS — E785 Hyperlipidemia, unspecified: Secondary | ICD-10-CM | POA: Insufficient documentation

## 2012-11-10 MED ORDER — LIDOCAINE-EPINEPHRINE 2 %-1:100000 IJ SOLN: 10.0000 mL | Freq: Once | INTRAMUSCULAR | Status: DC

## 2012-11-10 MED ORDER — LIDOCAINE-EPINEPHRINE (PF) 2 %-1:200000 IJ SOLN
INTRAMUSCULAR | Status: AC
  Filled 2012-11-10: qty 20

## 2012-11-10 NOTE — ED Provider Notes (Signed)
History     CSN: [id number]  Arrival date & time 11/10/12  1357   First MD Initiated Contact with Patient 11/10/12 1614      Chief Complaint  Patient presents with  . Abscess    (Consider location/radiation/quality/duration/timing/severity/associated sxs/prior treatment) Patient is a 67 y.o. female presenting with abscess. The history is provided by the patient.  Abscess Associated symptoms: no fever, no headaches, no nausea and no vomiting   pt c/o sore, swollen area/abscess to scalp for past week. Notes hx similar symptoms in past, pt unsure if prior dx mrsa. Dull, constant, non radiating pain to area worse w palpation. No fever or chills. No nv. Does not feel ill.     Past Medical History  Diagnosis Date  . 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 Hyperlipidemia   . Anxiety   . Aortic stenosis 2009    Echocardiogram 2009-normal EF, mild AS; negative stress nuclear in 2004; negative carotid duplex study in 2009; echo 10/2011-mild LVH, AS-IS-apical akinesis with EF of 20-25%,  mild to mod. AS  . Pneumonia 10/2011    Hospitalized  . Palpitations     freq. PVCs  . Cardiomyopathy 10/2011    EF of 20-25% on echo in 10/2011  . Systolic dysfunction 123456    severe  . Heart murmur   . Shortness of breath   . Diabetes mellitus, type 2     insulin dependent  . Arthritis     in knee    Past Surgical History  Procedure Laterality Date  . Tubal ligation    . Cesarean section    . Cardiac catheterization  12/05/2011    Family History  Problem Relation Age of Onset  . Arthritis Other     Family History  . Heart disease Mother   . Heart attack Mother   . Diabetes Mother   . Hypertension Mother   . Cancer Father     colon  . Cancer Sister     breast  . Diabetes Brother   . Hypertension Brother   . Stroke Brother     History  Substance Use Topics  . Smoking status: Never Smoker   . Smokeless tobacco: Never Used  . Alcohol  Use: No    OB History   Grav Para Term Preterm Abortions TAB SAB Ect Mult Living                  Review of Systems  Constitutional: Negative for fever and chills.  HENT: Negative for neck pain and neck stiffness.   Gastrointestinal: Negative for nausea and vomiting.  Neurological: Negative for headaches.    Allergies  Penicillins  Home Medications   Current Outpatient Rx  Name  Route  Sig  Dispense  Refill  . atorvastatin (LIPITOR) 80 MG tablet   Oral   Take 1 tablet (80 mg total) by mouth daily. Dose increase effective 07/27/2012. Discontinue lipitor 40mg    30 tablet   11   . carvedilol (COREG) 25 MG tablet   Oral   Take 1 tablet (25 mg total) by mouth 2 (two) times daily with a meal.   60 tablet   11     Increase in dose   . fenofibrate (TRICOR) 145 MG tablet   Oral   Take 1 tablet (145 mg total) by mouth daily.   30 tablet   4   . furosemide (LASIX) 40 MG tablet  Oral   Take 1 tablet (40 mg total) by mouth daily.   30 tablet   11   . glipiZIDE (GLUCOTROL XL) 5 MG 24 hr tablet   Oral   Take 5-10 mg by mouth daily. Two tablets with breakfast, and one tablet at supper. Discontinue glipizide 10mg  tablets         . glucose blood (ONE TOUCH ULTRA TEST) test strip      Use as instructed twice daily DX 250.00   100 each   5   . lisinopril (PRINIVIL,ZESTRIL) 2.5 MG tablet   Oral   Take 1 tablet (2.5 mg total) by mouth daily.   30 tablet   11   . Multiple Vitamin (MULITIVITAMIN WITH MINERALS) TABS   Oral   Take 1 tablet by mouth every morning.         Glory Rosebush DELICA LANCETS FINE MISC   Does not apply   1 each by Does not apply route 2 (two) times daily. DX 250.00   100 each   5   . sitaGLIPtan-metformin (JANUMET) 50-1000 MG per tablet   Oral   Take 1 tablet by mouth 2 (two) times daily with a meal.   180 tablet   1   . spironolactone (ALDACTONE) 25 MG tablet   Oral   Take 1 tablet (25 mg total) by mouth daily.   30 tablet    11   . temazepam (RESTORIL) 15 MG capsule   Oral   Take 1 capsule (15 mg total) by mouth at bedtime as needed for sleep.   30 capsule   4   . albuterol (PROVENTIL HFA;VENTOLIN HFA) 108 (90 BASE) MCG/ACT inhaler   Inhalation   Inhale 2 puffs into the lungs every 6 (six) hours as needed. For wheezing   18 g   1     BP 102/85  Pulse 91  Temp(Src) 99.3 F (37.4 C) (Oral)  Resp 16  Ht 5\' 6"  (1.676 m)  Wt 173 lb (78.472 kg)  BMI 27.94 kg/m2  SpO2 100%  Physical Exam  Nursing note and vitals reviewed. Constitutional: She appears well-developed and well-nourished. No distress.  HENT:  2 cm diameter abscess to scalp.   Eyes: Conjunctivae are normal. No scleral icterus.  Neck: Neck supple. No tracheal deviation present.  Cardiovascular: Normal rate.   Pulmonary/Chest: Effort normal. No respiratory distress.  Abdominal: Normal appearance.  Musculoskeletal: She exhibits no edema.  Neurological: She is alert.  Skin: Skin is warm and dry. No rash noted.  Psychiatric: She has a normal mood and affect.    ED Course  Procedures (including critical care time)     MDM  Discussed dx/diff dx w pt.  I and D.  Reviewed nursing notes and prior charts for additional history.   INCISION AND DRAINAGE Performed by: Mirna Mires Consent: Verbal consent obtained. Risks and benefits: risks, benefits and alternatives were discussed Type: abscess  Body area: scalp  Anesthesia: local infiltration  Incision was made with a scalpel.  Local anesthetic: lidocaine 2% w epinephrine  Anesthetic total: 4 ml  Complexity: complex Blunt dissection to break up loculations  Drainage: purulent  Drainage amount: moderate   Patient tolerance: Patient tolerated the procedure well with no immediate complications.  Sterile dressing   No cellulitis, and adequate I and D, pt does not feel ill, no fevers, therefore abx not indicated at this time.  Discussed wound care w pt, pcp f/u  Monday.  Mirna Mires, MD 11/10/12 [id number]

## 2012-11-10 NOTE — ED Notes (Signed)
Pt c/o abscess to back of head area that she noticed last week, pain now radiating to left ear area.

## 2012-11-12 ENCOUNTER — Telehealth: Payer: Self-pay | Admitting: Family Medicine

## 2012-11-12 NOTE — Telephone Encounter (Signed)
Typed and printed to be faxed

## 2012-11-15 ENCOUNTER — Ambulatory Visit (INDEPENDENT_AMBULATORY_CARE_PROVIDER_SITE_OTHER): Payer: Medicare Other | Admitting: Family Medicine

## 2012-11-15 ENCOUNTER — Encounter: Payer: Self-pay | Admitting: Family Medicine

## 2012-11-15 VITALS — BP 104/62 | HR 80 | Resp 18 | Ht 65.25 in | Wt 178.0 lb

## 2012-11-15 DIAGNOSIS — J302 Other seasonal allergic rhinitis: Secondary | ICD-10-CM

## 2012-11-15 DIAGNOSIS — I10 Essential (primary) hypertension: Secondary | ICD-10-CM

## 2012-11-15 DIAGNOSIS — L0390 Cellulitis, unspecified: Secondary | ICD-10-CM

## 2012-11-15 DIAGNOSIS — L0291 Cutaneous abscess, unspecified: Secondary | ICD-10-CM

## 2012-11-15 DIAGNOSIS — E119 Type 2 diabetes mellitus without complications: Secondary | ICD-10-CM

## 2012-11-15 DIAGNOSIS — J309 Allergic rhinitis, unspecified: Secondary | ICD-10-CM

## 2012-11-15 MED ORDER — DOXYCYCLINE HYCLATE 100 MG PO TABS: 100.0000 mg | ORAL_TABLET | Freq: Two times a day (BID) | ORAL | Status: AC

## 2012-11-15 MED ORDER — FENOFIBRATE 145 MG PO TABS: 145.0000 mg | ORAL_TABLET | Freq: Every day | ORAL | Status: DC

## 2012-11-15 MED ORDER — LISINOPRIL 2.5 MG PO TABS: 2.5000 mg | ORAL_TABLET | Freq: Every day | ORAL | Status: DC

## 2012-11-15 MED ORDER — PREDNISONE (PAK) 5 MG PO TABS: 5.0000 mg | ORAL_TABLET | ORAL | Status: DC

## 2012-11-15 MED ORDER — METHYLPREDNISOLONE ACETATE 80 MG/ML IJ SUSP
80.0000 mg | Freq: Once | INTRAMUSCULAR | Status: AC
  Administered 2012-11-15: 80 mg via INTRAMUSCULAR

## 2012-11-15 MED ORDER — LORATADINE 10 MG PO TABS: 10.0000 mg | ORAL_TABLET | Freq: Every day | ORAL | Status: DC

## 2012-11-15 NOTE — Progress Notes (Signed)
  Subjective:    Patient ID: [name] [name], female    DOB: October 21, 1945, 67 y.o.   MRN: [id number]  HPI Pt in for f/u abcess of scalp on 11/10/2012. No fever , chills or pain in the area, looks good, got no antibiotics prescribed. C/o head congestion, clear, does have excessive allergies most years, excess cough and nasal drainage which at times is thick Blood sugars are fluctuating depending on diet, she understands rthis, has ahd a few lows when she delayed eating   Review of Systems See HPI Denies recent fever has had  chills.  Denies chest pains, palpitations and leg swelling Denies abdominal pain, nausea, vomiting,diarrhea or constipation.   Denies dysuria, frequency, hesitancy or incontinence. Denies joint pain, swelling and limitation in mobility. Denies headaches, seizures, numbness, or tingling. Denies depression, anxiety or insomnia.         Objective:   Physical Exam Patient alert and oriented and in no cardiopulmonary distress.  HEENT: No facial asymmetry, EOMI, no sinus tenderness,  oropharynx pink and moist.  Neck supple no adenopathy.Eryhtema and edema of nasal mucosa  Chest: Clear to auscultation bilaterally.  CVS: S1, S2 no murmurs, no S3.  ABD: Soft non tender. Bowel sounds normal.  Ext: No edema  MS: Adequate ROM spine, shoulders, hips and knees.  Skin: ulcer on scalp closing well, mild erythema noted in area  Psych: Good eye contact, normal affect. Memory intact not anxious or depressed appearing.  CNS: CN 2-12 intact, power, tone and sensation normal throughout.        Assessment & Plan:

## 2012-11-15 NOTE — Addendum Note (Signed)
Addended by: Denman George B on: 11/15/2012 04:32 PM   Modules accepted: Orders

## 2012-11-15 NOTE — Assessment & Plan Note (Signed)
Healing well, short antibiotic course prescribed due to mild erythema and had congestion with chills

## 2012-11-15 NOTE — Assessment & Plan Note (Signed)
Controlled, no change in medication  

## 2012-11-15 NOTE — Telephone Encounter (Signed)
Patient in for ov.

## 2012-11-15 NOTE — Assessment & Plan Note (Signed)
Uncontrolled, steroids and allergy pills

## 2012-11-15 NOTE — Patient Instructions (Addendum)
F/u as before.  Boil llooks good.  Depo medrol 80 mgfor allergies and claritin and prednisone  Dose pack presribed and use daily nose spray  Five days antibiotic also prescribed for congestion and sore in head

## 2012-11-15 NOTE — Assessment & Plan Note (Signed)
Uncontrolled, depo medrol in office 

## 2012-11-16 ENCOUNTER — Telehealth: Payer: Self-pay | Admitting: Family Medicine

## 2012-11-16 DIAGNOSIS — E785 Hyperlipidemia, unspecified: Secondary | ICD-10-CM

## 2012-11-16 DIAGNOSIS — E119 Type 2 diabetes mellitus without complications: Secondary | ICD-10-CM

## 2012-11-16 DIAGNOSIS — E669 Obesity, unspecified: Secondary | ICD-10-CM

## 2012-11-16 DIAGNOSIS — I10 Essential (primary) hypertension: Secondary | ICD-10-CM

## 2012-11-16 NOTE — Telephone Encounter (Signed)
Patient aware and labs ordered and faxed to [facility].

## 2012-11-16 NOTE — Telephone Encounter (Signed)
Needs fasting lipid, cmp and EGFr, CBc, hBA1C and TSH pls order and let her know

## 2012-11-20 ENCOUNTER — Ambulatory Visit: Payer: Medicare Other | Admitting: Family Medicine

## 2012-12-14 LAB — COMPLETE METABOLIC PANEL WITH GFR
ALT: 14 U/L (ref 0–35)
AST: 12 U/L (ref 0–37)
Albumin: 4.4 g/dL (ref 3.5–5.2)
Alkaline Phosphatase: 25 U/L — ABNORMAL LOW (ref 39–117)
BUN: 33 mg/dL — ABNORMAL HIGH (ref 6–23)
CO2: 25 mEq/L (ref 19–32)
Calcium: 9.9 mg/dL (ref 8.4–10.5)
Chloride: 104 mEq/L (ref 96–112)
Creat: 1.61 mg/dL — ABNORMAL HIGH (ref 0.50–1.10)
GFR, Est African American: 38 mL/min — ABNORMAL LOW
GFR, Est Non African American: 33 mL/min — ABNORMAL LOW
Glucose, Bld: 94 mg/dL (ref 70–99)
Potassium: 4.4 mEq/L (ref 3.5–5.3)
Sodium: 139 mEq/L (ref 135–145)
Total Bilirubin: 0.4 mg/dL (ref 0.3–1.2)
Total Protein: 7.3 g/dL (ref 6.0–8.3)

## 2012-12-14 LAB — LIPID PANEL
Cholesterol: 284 mg/dL — ABNORMAL HIGH (ref 0–200)
HDL: 50 mg/dL (ref >39)
LDL Cholesterol: 217 mg/dL — ABNORMAL HIGH (ref 0–99)
Total CHOL/HDL Ratio: 5.7 Ratio
Triglycerides: 87 mg/dL (ref <150)
VLDL: 17 mg/dL (ref 0–40)

## 2012-12-14 LAB — CBC
HCT: 30.5 % — ABNORMAL LOW (ref 36.0–46.0)
Hemoglobin: 9.9 g/dL — ABNORMAL LOW (ref 12.0–15.0)
MCH: 25.3 pg — ABNORMAL LOW (ref 26.0–34.0)
MCHC: 32.5 g/dL (ref 30.0–36.0)
MCV: 78 fL (ref 78.0–100.0)
Platelets: 305 10*3/uL (ref 150–400)
RBC: 3.91 MIL/uL (ref 3.87–5.11)
RDW: 16.4 % — ABNORMAL HIGH (ref 11.5–15.5)
WBC: 4.4 10*3/uL (ref 4.0–10.5)

## 2012-12-14 LAB — HEMOGLOBIN A1C
Hgb A1c MFr Bld: 6.2 % — ABNORMAL HIGH (ref <5.7)
Mean Plasma Glucose: 131 mg/dL — ABNORMAL HIGH (ref <117)

## 2012-12-14 LAB — TSH: TSH: 2.515 u[IU]/mL (ref 0.350–4.500)

## 2012-12-23 ENCOUNTER — Other Ambulatory Visit: Payer: Self-pay | Admitting: Family Medicine

## 2012-12-23 MED ORDER — FENOFIBRATE 145 MG PO TABS: 145.0000 mg | ORAL_TABLET | Freq: Every day | ORAL | Status: DC

## 2012-12-24 ENCOUNTER — Other Ambulatory Visit: Payer: Self-pay | Admitting: Family Medicine

## 2012-12-25 ENCOUNTER — Other Ambulatory Visit: Payer: Self-pay | Admitting: *Deleted

## 2012-12-25 MED ORDER — FUROSEMIDE 40 MG PO TABS: 40.0000 mg | ORAL_TABLET | Freq: Every day | ORAL | Status: DC

## 2012-12-26 ENCOUNTER — Encounter: Payer: Self-pay | Admitting: Family Medicine

## 2012-12-26 ENCOUNTER — Ambulatory Visit (INDEPENDENT_AMBULATORY_CARE_PROVIDER_SITE_OTHER): Payer: Medicare Other | Admitting: Family Medicine

## 2012-12-26 VITALS — BP 120/78 | HR 77 | Resp 16 | Wt 177.0 lb

## 2012-12-26 DIAGNOSIS — D649 Anemia, unspecified: Secondary | ICD-10-CM

## 2012-12-26 DIAGNOSIS — I10 Essential (primary) hypertension: Secondary | ICD-10-CM

## 2012-12-26 DIAGNOSIS — E785 Hyperlipidemia, unspecified: Secondary | ICD-10-CM

## 2012-12-26 DIAGNOSIS — I5022 Chronic systolic (congestive) heart failure: Secondary | ICD-10-CM

## 2012-12-26 DIAGNOSIS — E119 Type 2 diabetes mellitus without complications: Secondary | ICD-10-CM

## 2012-12-26 MED ORDER — NIACIN ER (ANTIHYPERLIPIDEMIC) 1000 MG PO TBCR: 1000.0000 mg | EXTENDED_RELEASE_TABLET | Freq: Every day | ORAL | Status: DC

## 2012-12-26 NOTE — Patient Instructions (Addendum)
F/u in  53month, call if you need me before  Hepatic panel in 6 weeks, non fasting   Fasting lipid cmp and EGFR, hBA1C in  4 month  You NEED to stop eating egg yolks, start third additional drug niacin for cholesterol  It is important that you exercise regularly at least 30 minutes 5 times a week. If you develop chest pain, have severe difficulty breathing, or feel very tired, stop exercising immediately and seek medical attention    PLEASE call and schedule your mammogram when you go home you NEED this

## 2012-12-26 NOTE — Progress Notes (Signed)
  Subjective:    Patient ID: [name] [name], female    DOB: 1945-10-03, 67 y.o.   MRN: [id number]  HPI The PT is here for follow up and re-evaluation of chronic medical conditions, medication management and review of any available recent lab and radiology data.  Preventive health is updated, specifically  Cancer screening and Immunization.   Questions or concerns regarding consultations or procedures which the PT has had in the interim are  addressed. The PT denies any adverse reactions to current medications since the last visit.  There are no new concerns.  There are no specific complaints       Review of Systems See HPI Denies recent fever or chills. Denies sinus pressure, nasal congestion, ear pain or sore throat. Denies chest congestion, productive cough or wheezing. Denies chest pains, palpitations and leg swelling Denies abdominal pain, nausea, vomiting,diarrhea or constipation.   Denies dysuria, frequency, hesitancy or incontinence. Denies joint pain, swelling and limitation in mobility. Denies headaches, seizures, numbness, or tingling. Denies depression, anxiety or insomnia. Denies skin break down or rash.        Objective:   Physical Exam  Patient alert and oriented and in no cardiopulmonary distress.  HEENT: No facial asymmetry, EOMI, no sinus tenderness,  oropharynx pink and moist.  Neck supple no adenopathy.  Chest: Clear to auscultation bilaterally.  CVS: S1, S2 , systolic  murmur, no S3.  ABD: Soft non tender. Bowel sounds normal.  Ext: No edema  MS: Adequate though reduced  ROM spine, shoulders, hips and knees.  Skin: Intact, no ulcerations or rash noted.  Psych: Good eye contact,flat affect. Memory intact mildly anxious and  depressed appearing.  CNS: CN 2-12 intact, power, tone and sensation normal throughout.       Assessment & Plan:

## 2013-01-01 ENCOUNTER — Other Ambulatory Visit: Payer: Self-pay | Admitting: Family Medicine

## 2013-01-01 ENCOUNTER — Telehealth: Payer: Self-pay | Admitting: Family Medicine

## 2013-01-01 NOTE — Telephone Encounter (Signed)
She shouuld resume aspirin 81mg  one dailym, not on any blood thinmner now, I am adding it , canh send script if requested

## 2013-01-02 NOTE — Telephone Encounter (Signed)
Patient aware.

## 2013-01-24 NOTE — Assessment & Plan Note (Signed)
Currently stable and asymptomatic

## 2013-01-24 NOTE — Assessment & Plan Note (Signed)
Controlled, no change in medication DASH diet and commitment to daily physical activity for a minimum of 30 minutes discussed and encouraged, as a part of hypertension management. The importance of attaining a healthy weight is also discussed.  

## 2013-01-24 NOTE — Assessment & Plan Note (Signed)
Controlled, no change in medication Patient advised to reduce carb and sweets, commit to regular physical activity, take meds as prescribed, test blood as directed, and attempt to lose weight, to improve blood sugar control.  

## 2013-01-24 NOTE — Assessment & Plan Note (Signed)
Uncontrolled Hyperlipidemia:Low fat diet discussed and encouraged.

## 2013-03-08 LAB — HEPATIC FUNCTION PANEL
ALT: 21 U/L (ref 0–35)
AST: 21 U/L (ref 0–37)
Albumin: 4 g/dL (ref 3.5–5.2)
Alkaline Phosphatase: 26 U/L — ABNORMAL LOW (ref 39–117)
Bilirubin, Direct: 0.1 mg/dL (ref 0.0–0.3)
Indirect Bilirubin: 0.3 mg/dL (ref 0.0–0.9)
Total Bilirubin: 0.4 mg/dL (ref 0.3–1.2)
Total Protein: 7.1 g/dL (ref 6.0–8.3)

## 2013-03-27 ENCOUNTER — Other Ambulatory Visit: Payer: Self-pay | Admitting: Family Medicine

## 2013-03-27 ENCOUNTER — Other Ambulatory Visit: Payer: Self-pay | Admitting: Cardiology

## 2013-03-28 ENCOUNTER — Other Ambulatory Visit: Payer: Self-pay

## 2013-03-28 MED ORDER — FUROSEMIDE 40 MG PO TABS: 40.0000 mg | ORAL_TABLET | Freq: Every day | ORAL | Status: DC

## 2013-04-09 ENCOUNTER — Ambulatory Visit: Payer: Medicare Other | Admitting: Cardiovascular Disease

## 2013-04-09 ENCOUNTER — Encounter: Payer: Self-pay | Admitting: Cardiovascular Disease

## 2013-05-01 ENCOUNTER — Telehealth: Payer: Self-pay | Admitting: Family Medicine

## 2013-05-01 ENCOUNTER — Ambulatory Visit (INDEPENDENT_AMBULATORY_CARE_PROVIDER_SITE_OTHER): Payer: Medicare Other | Admitting: Family Medicine

## 2013-05-01 ENCOUNTER — Encounter: Payer: Self-pay | Admitting: Family Medicine

## 2013-05-01 VITALS — BP 108/68 | HR 87 | Temp 99.6°F | Resp 16 | Wt 173.8 lb

## 2013-05-01 DIAGNOSIS — J019 Acute sinusitis, unspecified: Secondary | ICD-10-CM

## 2013-05-01 DIAGNOSIS — I10 Essential (primary) hypertension: Secondary | ICD-10-CM

## 2013-05-01 DIAGNOSIS — Z13820 Encounter for screening for osteoporosis: Secondary | ICD-10-CM

## 2013-05-01 DIAGNOSIS — D649 Anemia, unspecified: Secondary | ICD-10-CM

## 2013-05-01 DIAGNOSIS — J209 Acute bronchitis, unspecified: Secondary | ICD-10-CM

## 2013-05-01 DIAGNOSIS — E119 Type 2 diabetes mellitus without complications: Secondary | ICD-10-CM

## 2013-05-01 DIAGNOSIS — E785 Hyperlipidemia, unspecified: Secondary | ICD-10-CM

## 2013-05-01 DIAGNOSIS — Z1239 Encounter for other screening for malignant neoplasm of breast: Secondary | ICD-10-CM

## 2013-05-01 MED ORDER — GLIPIZIDE ER 5 MG PO TB24: 5.0000 mg | ORAL_TABLET | Freq: Every day | ORAL | Status: DC

## 2013-05-01 MED ORDER — CALCIUM CARBONATE-VITAMIN D 500-200 MG-UNIT PO TABS: 1.0000 | ORAL_TABLET | Freq: Two times a day (BID) | ORAL | Status: DC

## 2013-05-01 MED ORDER — AZITHROMYCIN 250 MG PO TABS: ORAL_TABLET | ORAL | Status: AC

## 2013-05-01 MED ORDER — NIACIN ER (ANTIHYPERLIPIDEMIC) 1000 MG PO TBCR: 1000.0000 mg | EXTENDED_RELEASE_TABLET | Freq: Every day | ORAL | Status: DC

## 2013-05-01 MED ORDER — BENZONATATE 100 MG PO CAPS: 100.0000 mg | ORAL_CAPSULE | Freq: Three times a day (TID) | ORAL | Status: DC | PRN

## 2013-05-01 NOTE — Progress Notes (Signed)
  Subjective:    Patient ID: [name] [name], female    DOB: March 23, 1946, 67 y.o.   MRN: [id number]  HPI 3 day h/o excessive cough with chills and sinus pressure with yellow green drainage, sputum is yellow at times. C/o low blood sugars under 60 espescialy at night intermittently Improved depression as spouse's situation has improved, still very worried about grandchildren   Review of Systems See HPI  Denies chest pains, palpitations and leg swelling Denies abdominal pain, nausea, vomiting,diarrhea or constipation.   Denies dysuria, frequency, hesitancy or incontinence. Denies joint pain, swelling and limitation in mobility. Denies headaches, seizures, numbness, or tingling. Denies uncontrolled  depression, anxiety or insomnia. Denies skin break down or rash.        Objective:   Physical Exam Patient alert and oriented and in no cardiopulmonary distress.  HEENT: No facial asymmetry, EOMI, frontal and maxillary  sinus tenderness,  oropharynx pink and moist.  Neck supple anterior cervical adenopathy.  Chest: adequate though decreased air entry, scattered crackles, no wheezes  CVS: S1, S2 no murmurs, no S3.  ABD: Soft non tender. Bowel sounds normal.  Ext: No edema  MS: Adequate though reduced  ROM spine, shoulders, hips and knees.  Skin: Intact, no ulcerations or rash noted.  Psych: Good eye contact, normal affect. Memory intact not anxious or depressed appearing.  CNS: CN 2-12 intact, power, tone and sensation normal throughout.        Assessment & Plan:

## 2013-05-01 NOTE — Patient Instructions (Addendum)
F/u in mid October, 3 to 4 weeks  Fasting lipid, cmp and EGFR, HBA1C, cbc and iron before next visit.  You are treated for acute sinusitis and bronchitis, 2 mediications are prescribed  You will get appt for mammogram on your way out, also for a bone density scan, you need both  New for bones sent in today is calcium with D one tablet twice daily  REDUCE glipizide 5mg  to ONE tablet oNCE daily, starting today

## 2013-05-02 NOTE — Telephone Encounter (Signed)
Patient is aware 

## 2013-05-05 NOTE — Assessment & Plan Note (Signed)
Reports recurrent hypoglycemia,  Dose reduction of glipizide Updated lab next visit

## 2013-05-05 NOTE — Assessment & Plan Note (Signed)
Antibiotic prescribed 

## 2013-05-05 NOTE — Assessment & Plan Note (Signed)
Controlled, no change in medication  

## 2013-05-05 NOTE — Assessment & Plan Note (Signed)
Hyperlipidemia:Low fat diet discussed and encouraged.  Updated lab for next visit

## 2013-05-05 NOTE — Assessment & Plan Note (Signed)
Decongestant and antibiotic prescribed 

## 2013-05-07 ENCOUNTER — Inpatient Hospital Stay (HOSPITAL_COMMUNITY): Admission: RE | Admit: 2013-05-07 | Payer: Medicare Other | Source: Ambulatory Visit

## 2013-05-07 ENCOUNTER — Other Ambulatory Visit (HOSPITAL_COMMUNITY): Payer: Medicare Other

## 2013-05-13 ENCOUNTER — Other Ambulatory Visit (HOSPITAL_COMMUNITY): Payer: Medicare Other

## 2013-05-17 ENCOUNTER — Encounter: Payer: Self-pay | Admitting: Adult Health

## 2013-05-17 ENCOUNTER — Ambulatory Visit (INDEPENDENT_AMBULATORY_CARE_PROVIDER_SITE_OTHER): Payer: Medicare Other | Admitting: Adult Health

## 2013-05-17 VITALS — BP 104/65 | HR 95 | Ht 62.0 in | Wt 173.0 lb

## 2013-05-17 DIAGNOSIS — I359 Nonrheumatic aortic valve disorder, unspecified: Secondary | ICD-10-CM

## 2013-05-17 DIAGNOSIS — I5022 Chronic systolic (congestive) heart failure: Secondary | ICD-10-CM

## 2013-05-17 DIAGNOSIS — I350 Nonrheumatic aortic (valve) stenosis: Secondary | ICD-10-CM

## 2013-05-17 DIAGNOSIS — I10 Essential (primary) hypertension: Secondary | ICD-10-CM

## 2013-05-17 NOTE — Progress Notes (Signed)
HPI: [name] [name] is a 67 year old patient of Dr. Crissie Sickles we are following for ongoing assessment and management of ischemic heart myopathy, with bundle branch block, with chronic class II congestive heart failure. At the last visit with [name] in Feb 2014, topic of ICD implantation was discussed with the patient. She wished to think about it. Her ejection fraction per echocardiogram in March of 2013 demonstrated an EF of 20-25% with mild to moderate AS.   She comes today without cardiac complaint. She weights herself daily, is medically compliant, and watches salt intake. She again declines ICD pacemaker.She is due for follow up labs in 4 days with PCP.  Allergies  Allergen Reactions  . Penicillins     Family unsure of reaction, but certain mom said she was allergic    Current Outpatient Prescriptions  Medication Sig Dispense Refill  . albuterol (PROVENTIL HFA;VENTOLIN HFA) 108 (90 BASE) MCG/ACT inhaler Inhale 2 puffs into the lungs every 6 (six) hours as needed. For wheezing  18 g  1  . aspirin EC 81 MG tablet Take 1 tablet (81 mg total) by mouth daily.  150 tablet  2  . atorvastatin (LIPITOR) 80 MG tablet Take 1 tablet (80 mg total) by mouth daily. Dose increase effective 07/27/2012. Discontinue lipitor 40mg   30 tablet  11  . benzonatate (TESSALON PERLES) 100 MG capsule Take 1 capsule (100 mg total) by mouth 3 (three) times daily as needed for cough.  21 capsule  0  . calcium-vitamin D (OSCAL 500/200 D-3) 500-200 MG-UNIT per tablet Take 1 tablet by mouth 2 (two) times daily.  180 tablet  3  . carvedilol (COREG) 25 MG tablet Take 1 tablet (25 mg total) by mouth 2 (two) times daily with a meal.  60 tablet  11  . fenofibrate (TRICOR) 145 MG tablet Take 1 tablet (145 mg total) by mouth daily.  30 tablet  11  . furosemide (LASIX) 40 MG tablet Take 1 tablet (40 mg total) by mouth daily.  30 tablet  3  . glipiZIDE (GLUCOTROL XL) 5 MG 24 hr tablet Take 1 tablet (5 mg total) by mouth  daily.  30 tablet  3  . glucose blood (ONE TOUCH ULTRA TEST) test strip Use as instructed twice daily DX 250.00  100 each  5  . lisinopril (PRINIVIL,ZESTRIL) 2.5 MG tablet Take 1 tablet (2.5 mg total) by mouth daily.  30 tablet  11  . loratadine (CLARITIN) 10 MG tablet Take 1 tablet (10 mg total) by mouth daily.  30 tablet  2  . Multiple Vitamin (MULITIVITAMIN WITH MINERALS) TABS Take 1 tablet by mouth every morning.      . niacin (NIASPAN) 1000 MG CR tablet Take 1 tablet (1,000 mg total) by mouth at bedtime.  30 tablet  3  . ONETOUCH DELICA LANCETS FINE MISC 1 each by Does not apply route 2 (two) times daily. DX 250.00  100 each  5  . sitaGLIPtan-metformin (JANUMET) 50-1000 MG per tablet Take 1 tablet by mouth 2 (two) times daily with a meal.  180 tablet  1  . spironolactone (ALDACTONE) 25 MG tablet TAKE 1 TABLET BY MOUTH ONCE DAILY.  30 tablet  6  . [DISCONTINUED] cetirizine (ZYRTEC) 10 MG tablet Take 1 tablet (10 mg total) by mouth daily.  30 tablet  3  . [DISCONTINUED] ferrous sulfate 325 (65 FE) MG EC tablet Take 1 tablet (325 mg total) by mouth 2 (two) times daily.  60 tablet  5  . [DISCONTINUED] FLUoxetine (PROZAC) 10 MG tablet Take 1 tablet (10 mg total) by mouth daily. Take one capsule by mouth once a day  30 tablet  3  . [DISCONTINUED] fluticasone (FLONASE) 50 MCG/ACT nasal spray Place 2 sprays into the nose daily.  16 g  3   No current facility-administered medications for this visit.    Past Medical History  Diagnosis Date  . 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 Hyperlipidemia   . Anxiety   . Aortic stenosis 2009    Echocardiogram 2009-normal EF, mild AS; negative stress nuclear in 2004; negative carotid duplex study in 2009; echo 10/2011-mild LVH, AS-IS-apical akinesis with EF of 20-25%,  mild to mod. AS  . Pneumonia 10/2011    Hospitalized  . Palpitations     freq. PVCs  . Cardiomyopathy 10/2011    EF of 20-25% on echo in 10/2011  .  Systolic dysfunction [id number]    severe  . Heart murmur   . Shortness of breath   . Diabetes mellitus, type 2     insulin dependent  . Arthritis     in knee    Past Surgical History  Procedure Laterality Date  . Tubal ligation    . Cesarean section    . Cardiac catheterization  12/05/2011    [id number] of systems complete and found to be negative unless listed above  PHYSICAL EXAM BP 104/65  Pulse 95  Ht 5\' 2"  (1.575 m)  Wt 173 lb (78.472 kg)  BMI 31.63 kg/m2  General: Well developed, well nourished, in no acute distress Head: Eyes PERRLA, No xanthomas.   Normal cephalic and atramatic  Lungs: Clear bilaterally to auscultation and percussion. Heart: HRRR S1 S2, 2/6 systolic murmur slightly tachycardic.  Pulses are 2+ & equal.            No carotid bruit. No JVD.  No abdominal bruits. No femoral bruits. Abdomen: Bowel sounds are positive, abdomen soft and non-tender without masses or                  Hernia's noted. Msk:  Back normal, normal gait. Normal strength and tone for age. Extremities: No clubbing, cyanosis or edema.  DP +1 Neuro: Alert and oriented X 3. Psych:  Good affect, responds appropriately    ASSESSMENT AND PLAN

## 2013-05-17 NOTE — Assessment & Plan Note (Signed)
BP is low normal but optimal for systolic dysfunction. She will continue current medication regimen. BMET will be completed in 4 days.

## 2013-05-17 NOTE — Progress Notes (Deleted)
Name: [name] [name]    DOB: April 15, 1946  Age: 67 y.o.  MR#: [id number]       PCP:  [name], MD      Insurance: Payor: [employer] / Plan: BLUE MEDICARE / Product Type: *No Product type* /   CC:    Chief Complaint  Patient presents with  . Congestive Heart Failure  . Hypertension    VS Filed Vitals:   05/17/13 1308  BP: 104/65  Pulse: 95  Height: 5\' 2"  (1.575 m)  Weight: 173 lb (78.472 kg)    Weights Current Weight  05/17/13 173 lb (78.472 kg)  05/01/13 173 lb 12.8 oz (78.835 kg)  12/26/12 177 lb (80.287 kg)    Blood Pressure  BP Readings from Last 3 Encounters:  05/17/13 104/65  05/01/13 108/68  12/26/12 120/78     Admit date:  (Not on file) Last encounter with RMR:  Visit date not found   Allergy Penicillins  Current Outpatient Prescriptions  Medication Sig Dispense Refill  . albuterol (PROVENTIL HFA;VENTOLIN HFA) 108 (90 BASE) MCG/ACT inhaler Inhale 2 puffs into the lungs every 6 (six) hours as needed. For wheezing  18 g  1  . aspirin EC 81 MG tablet Take 1 tablet (81 mg total) by mouth daily.  150 tablet  2  . atorvastatin (LIPITOR) 80 MG tablet Take 1 tablet (80 mg total) by mouth daily. Dose increase effective 07/27/2012. Discontinue lipitor 40mg   30 tablet  11  . benzonatate (TESSALON PERLES) 100 MG capsule Take 1 capsule (100 mg total) by mouth 3 (three) times daily as needed for cough.  21 capsule  0  . calcium-vitamin D (OSCAL 500/200 D-3) 500-200 MG-UNIT per tablet Take 1 tablet by mouth 2 (two) times daily.  180 tablet  3  . carvedilol (COREG) 25 MG tablet Take 1 tablet (25 mg total) by mouth 2 (two) times daily with a meal.  60 tablet  11  . fenofibrate (TRICOR) 145 MG tablet Take 1 tablet (145 mg total) by mouth daily.  30 tablet  11  . furosemide (LASIX) 40 MG tablet Take 1 tablet (40 mg total) by mouth daily.  30 tablet  3  . glipiZIDE (GLUCOTROL XL) 5 MG 24 hr tablet Take 1 tablet (5 mg total) by mouth daily.  30  tablet  3  . glucose blood (ONE TOUCH ULTRA TEST) test strip Use as instructed twice daily DX 250.00  100 each  5  . lisinopril (PRINIVIL,ZESTRIL) 2.5 MG tablet Take 1 tablet (2.5 mg total) by mouth daily.  30 tablet  11  . loratadine (CLARITIN) 10 MG tablet Take 1 tablet (10 mg total) by mouth daily.  30 tablet  2  . Multiple Vitamin (MULITIVITAMIN WITH MINERALS) TABS Take 1 tablet by mouth every morning.      . niacin (NIASPAN) 1000 MG CR tablet Take 1 tablet (1,000 mg total) by mouth at bedtime.  30 tablet  3  . ONETOUCH DELICA LANCETS FINE MISC 1 each by Does not apply route 2 (two) times daily. DX 250.00  100 each  5  . sitaGLIPtan-metformin (JANUMET) 50-1000 MG per tablet Take 1 tablet by mouth 2 (two) times daily with a meal.  180 tablet  1  . spironolactone (ALDACTONE) 25 MG tablet TAKE 1 TABLET BY MOUTH ONCE DAILY.  30 tablet  6  . [DISCONTINUED] cetirizine (ZYRTEC) 10 MG tablet Take 1 tablet (10 mg total) by mouth daily.  [place]  tablet  3  . [DISCONTINUED] ferrous sulfate 325 (65 FE) MG EC tablet Take 1 tablet (325 mg total) by mouth 2 (two) times daily.  60 tablet  5  . [DISCONTINUED] FLUoxetine (PROZAC) 10 MG tablet Take 1 tablet (10 mg total) by mouth daily. Take one capsule by mouth once a day  30 tablet  3  . [DISCONTINUED] fluticasone (FLONASE) 50 MCG/ACT nasal spray Place 2 sprays into the nose daily.  16 g  3   No current facility-administered medications for this visit.    Discontinued Meds:   There are no discontinued medications.  Patient Active Problem List   Diagnosis Date Noted  . Sinusitis, acute 05/01/2013  . Acute bronchitis 05/01/2013  . Allergic rhinitis 11/15/2012  . Routine general medical examination at a health care facility 07/28/2012  . Chronic systolic heart failure 0000000  . Seasonal allergies 02/22/2012  . Hypertension   . Aortic stenosis   . Anemia 10/28/2011  . Cardiomyopathy + LBBB 10/14/2011  . Insomnia 03/22/2011  . DIABETES MELLITUS, TYPE  II 10/24/2007  . HYPERLIPIDEMIA 10/24/2007  . Obesity 10/24/2007    LABS    Component Value Date/Time   NA 139 12/14/2012 1010   NA 141 07/25/2012 1010   NA 142 04/09/2012 0750   K 4.4 12/14/2012 1010   K 4.2 07/25/2012 1010   K 4.3 04/09/2012 0750   CL 104 12/14/2012 1010   CL 110 07/25/2012 1010   CL 106 04/09/2012 0750   CO2 25 12/14/2012 1010   CO2 26 07/25/2012 1010   CO2 26 04/09/2012 0750   GLUCOSE 94 12/14/2012 1010   GLUCOSE 89 07/25/2012 1010   GLUCOSE 130* 04/09/2012 0750   BUN 33* 12/14/2012 1010   BUN 28* 07/25/2012 1010   BUN 21 04/09/2012 0750   CREATININE 1.61* 12/14/2012 1010   CREATININE 1.48* 07/25/2012 1010   CREATININE 1.24* 04/09/2012 0750   CREATININE 1.30* 12/07/2011 0448   CREATININE 1.20* 12/05/2011 1028   CREATININE 0.88 12/05/2011 0440   CALCIUM 9.9 12/14/2012 1010   CALCIUM 9.8 07/25/2012 1010   CALCIUM 10.0 04/09/2012 0750   GFRNONAA 42* 12/07/2011 0448   GFRNONAA 46* 12/05/2011 1028   GFRNONAA 67* 12/05/2011 0440   GFRAA 48* 12/07/2011 0448   GFRAA 53* 12/05/2011 1028   GFRAA 78* 12/05/2011 0440   CMP     Component Value Date/Time   NA 139 12/14/2012 1010   K 4.4 12/14/2012 1010   CL 104 12/14/2012 1010   CO2 25 12/14/2012 1010   GLUCOSE 94 12/14/2012 1010   BUN 33* 12/14/2012 1010   CREATININE 1.61* 12/14/2012 1010   CREATININE 1.30* 12/07/2011 0448   CALCIUM 9.9 12/14/2012 1010   PROT 7.1 03/07/2013 1045   ALBUMIN 4.0 03/07/2013 1045   AST 21 03/07/2013 1045   ALT 21 03/07/2013 1045   ALKPHOS 26* 03/07/2013 1045   BILITOT 0.4 03/07/2013 1045   GFRNONAA 42* 12/07/2011 0448   GFRAA 48* 12/07/2011 0448       Component Value Date/Time   WBC 4.4 12/14/2012 1010   WBC 8.1 12/07/2011 0448   WBC 7.5 12/05/2011 0440   HGB 9.9* 12/14/2012 1010   HGB 11.9* 12/07/2011 0448   HGB 12.1 12/05/2011 0440   HCT 30.5* 12/14/2012 1010   HCT 35.1* 12/07/2011 0448   HCT 39.0 12/05/2011 0440   MCV 78.0 12/14/2012 1010   MCV 72.4* 12/07/2011 0448   MCV 74.4* 12/05/2011 0440    Lipid Panel  Component Value Date/Time   CHOL 284* 12/14/2012 1010   TRIG 87 12/14/2012 1010   HDL 50 12/14/2012 1010   CHOLHDL 5.7 12/14/2012 1010   VLDL 17 12/14/2012 1010   LDLCALC 217* 12/14/2012 1010    ABG    Component Value Date/Time   PHART 7.435* 12/05/2011 1247   PCO2ART 35.3 12/05/2011 1247   PO2ART 73.0* 12/05/2011 1247   HCO3 23.6 12/05/2011 1247   TCO2 25 12/05/2011 1247   ACIDBASEDEF 3.0* 12/01/2011 0025   O2SAT 95.0 12/05/2011 1247     Lab Results  Component Value Date   TSH 2.515 12/14/2012   BNP (last 3 results) No results found for this basename: PROBNP,  in the last 8760 hours Cardiac Panel (last 3 results) No results found for this basename: CKTOTAL, CKMB, TROPONINI, RELINDX,  in the last 72 hours  Iron/TIBC/Ferritin    Component Value Date/Time   IRON 27* 12/05/2011 0440   TIBC 342 12/05/2011 0440   FERRITIN 21 12/05/2011 0440     EKG Orders placed in visit on 06/20/12  . EKG 12-LEAD     Prior Assessment and Plan Problem List as of 05/17/2013     Cardiovascular and Mediastinum   Hypertension   Last Assessment & Plan   05/01/2013 Office Visit Written 05/05/2013  7:30 AM by Fayrene Helper, MD     Controlled, no change in medication     Aortic stenosis   Last Assessment & Plan   06/20/2012 Office Visit Written 06/20/2012  1:09 PM by Imogene Burn, PA     Stable    Cardiomyopathy + LBBB   Last Assessment & Plan   06/20/2012 Office Visit Written 06/20/2012  1:10 PM by Imogene Burn, PA     Scheduled to see [name] back in January    Chronic systolic heart failure   Last Assessment & Plan   12/26/2012 Office Visit Written 01/24/2013  6:03 AM by Fayrene Helper, MD     Currently stable and asymptomatic      Respiratory   Seasonal allergies   Last Assessment & Plan   11/15/2012 Office Visit Written 11/15/2012 12:08 PM by Fayrene Helper, MD     Uncontrolled, depo medrol in office    Allergic rhinitis   Sinusitis, acute   Last Assessment & Plan   05/01/2013  Office Visit Written 05/05/2013  7:29 AM by Fayrene Helper, MD     Antibiotic prescribed    Acute bronchitis   Last Assessment & Plan   05/01/2013 Office Visit Written 05/05/2013  7:29 AM by Fayrene Helper, MD     Decongestant and antibiotic prescribed      Endocrine   DIABETES MELLITUS, TYPE II   Last Assessment & Plan   05/01/2013 Office Visit Written 05/05/2013  7:31 AM by Fayrene Helper, MD     Reports recurrent hypoglycemia,  Dose reduction of glipizide Updated lab next visit      Other   [place]   05/01/2013 Office Visit Written 05/05/2013  7:30 AM by Fayrene Helper, MD     Hyperlipidemia:Low fat diet discussed and encouraged.  Updated lab for next visit    Obesity   Last Assessment & Plan   02/22/2012 Office Visit Written 02/22/2012  3:12 PM by Fayrene Helper, MD     Unchanged Patient re-educated about  the importance of commitment to a  minimum of 150 minutes of exercise per  week. The importance of healthy food choices with portion control discussed. Encouraged to start a food diary, count calories and to consider  joining a support group. Sample diet sheets offered. Goals set by the patient for the next several months.       Insomnia   Last Assessment & Plan   07/26/2012 Office Visit Written 07/28/2012  8:50 PM by Fayrene Helper, MD     Sleep hygiene reviewed, restoril to be continued    Anemia   Last Assessment & Plan   04/02/2012 Office Visit Edited 04/04/2012 10:56 PM by Yehuda Savannah, MD     Patient continues to have a microcytic anemia with iron studies suggesting iron deficiency.  A single stool Hemoccult was negative in 2011.  Upper and lower endoscopy appears to be indicated-I will discuss with patient's PCP, Dr. Moshe Cipro.    Routine general medical examination at a health care facility   Last Assessment & Plan   07/26/2012 Office Visit Written 07/28/2012  8:48 PM by Fayrene Helper, MD     Pelvic  and breast exam performed and documented. Pap sent, rectal normal , heme negative stool Importance of restricting sodium and water for heart failure management stessed. Following a low carb , low fat diet for good health discussed, as well as blood sugar control. Need to get flu vaccine        Imaging: No results found.

## 2013-05-17 NOTE — Patient Instructions (Addendum)
Your physician recommends that you schedule a follow-up appointment in: 6 months You will receive a reminder letter two months in advance reminding you to call and schedule your appointment. If you don't receive this letter, please contact our office.  Your physician recommends that you continue on your current medications as directed. Please refer to the Current Medication list given to you today.     

## 2013-05-17 NOTE — Assessment & Plan Note (Signed)
She has no evidence fluid overload today. She is weighing everyday. Watching salt. She remains on lasix 40 mg daily, ACE and spironolactone. Labs to be completed in 4 days per Dr Moshe Cipro.,

## 2013-05-17 NOTE — Assessment & Plan Note (Addendum)
Will need to repeat echo in 6 months for further evaluation. She is not short of breath. Heart rate is up today. Would like it to be in the 60-70 range.  May consider adding digoxin if creatinine status is normal.

## 2013-05-20 ENCOUNTER — Ambulatory Visit (HOSPITAL_COMMUNITY)
Admission: RE | Admit: 2013-05-20 | Discharge: 2013-05-20 | Disposition: A | Discharge status: Home / Self Care | Payer: Medicare Other | Source: Ambulatory Visit | Attending: Family Medicine | Admitting: Family Medicine

## 2013-05-20 DIAGNOSIS — Z1231 Encounter for screening mammogram for malignant neoplasm of breast: Secondary | ICD-10-CM | POA: Insufficient documentation

## 2013-05-20 DIAGNOSIS — Z1239 Encounter for other screening for malignant neoplasm of breast: Secondary | ICD-10-CM

## 2013-05-20 LAB — LIPID PANEL
Cholesterol: 223 mg/dL — ABNORMAL HIGH (ref 0–200)
HDL: 50 mg/dL (ref >39)
LDL Cholesterol: 161 mg/dL — ABNORMAL HIGH (ref 0–99)
Total CHOL/HDL Ratio: 4.5 Ratio
Triglycerides: 58 mg/dL (ref <150)
VLDL: 12 mg/dL (ref 0–40)

## 2013-05-20 LAB — CBC
HCT: 28.3 % — ABNORMAL LOW (ref 36.0–46.0)
Hemoglobin: 9.3 g/dL — ABNORMAL LOW (ref 12.0–15.0)
MCH: 26 pg (ref 26.0–34.0)
MCHC: 32.9 g/dL (ref 30.0–36.0)
MCV: 79.1 fL (ref 78.0–100.0)
Platelets: 300 10*3/uL (ref 150–400)
RBC: 3.58 MIL/uL — ABNORMAL LOW (ref 3.87–5.11)
RDW: 17 % — ABNORMAL HIGH (ref 11.5–15.5)
WBC: 5.1 10*3/uL (ref 4.0–10.5)

## 2013-05-20 LAB — COMPLETE METABOLIC PANEL WITH GFR
ALT: 25 U/L (ref 0–35)
AST: 27 U/L (ref 0–37)
Albumin: 3.9 g/dL (ref 3.5–5.2)
Alkaline Phosphatase: 24 U/L — ABNORMAL LOW (ref 39–117)
BUN: 23 mg/dL (ref 6–23)
CO2: 29 mEq/L (ref 19–32)
Calcium: 9.6 mg/dL (ref 8.4–10.5)
Chloride: 104 mEq/L (ref 96–112)
Creat: 1.67 mg/dL — ABNORMAL HIGH (ref 0.50–1.10)
GFR, Est African American: 36 mL/min — ABNORMAL LOW
GFR, Est Non African American: 31 mL/min — ABNORMAL LOW
Glucose, Bld: 124 mg/dL — ABNORMAL HIGH (ref 70–99)
Potassium: 3.7 mEq/L (ref 3.5–5.3)
Sodium: 140 mEq/L (ref 135–145)
Total Bilirubin: 0.4 mg/dL (ref 0.3–1.2)
Total Protein: 6.5 g/dL (ref 6.0–8.3)

## 2013-05-20 LAB — IRON: Iron: 47 ug/dL (ref 42–145)

## 2013-05-20 LAB — HEMOGLOBIN A1C
Hgb A1c MFr Bld: 6.8 % — ABNORMAL HIGH (ref <5.7)
Mean Plasma Glucose: 148 mg/dL — ABNORMAL HIGH (ref <117)

## 2013-05-21 ENCOUNTER — Ambulatory Visit (INDEPENDENT_AMBULATORY_CARE_PROVIDER_SITE_OTHER): Payer: Medicare Other | Admitting: Family Medicine

## 2013-05-21 ENCOUNTER — Encounter: Payer: Self-pay | Admitting: Family Medicine

## 2013-05-21 VITALS — BP 98/62 | HR 90 | Resp 16 | Ht 65.25 in | Wt 172.0 lb

## 2013-05-21 DIAGNOSIS — M25569 Pain in unspecified knee: Secondary | ICD-10-CM

## 2013-05-21 DIAGNOSIS — E785 Hyperlipidemia, unspecified: Secondary | ICD-10-CM

## 2013-05-21 DIAGNOSIS — N390 Urinary tract infection, site not specified: Secondary | ICD-10-CM

## 2013-05-21 DIAGNOSIS — I5022 Chronic systolic (congestive) heart failure: Secondary | ICD-10-CM

## 2013-05-21 DIAGNOSIS — J309 Allergic rhinitis, unspecified: Secondary | ICD-10-CM

## 2013-05-21 DIAGNOSIS — E1122 Type 2 diabetes mellitus with diabetic chronic kidney disease: Secondary | ICD-10-CM | POA: Insufficient documentation

## 2013-05-21 DIAGNOSIS — I10 Essential (primary) hypertension: Secondary | ICD-10-CM

## 2013-05-21 DIAGNOSIS — N183 Chronic kidney disease, stage 3 unspecified: Secondary | ICD-10-CM

## 2013-05-21 DIAGNOSIS — Z23 Encounter for immunization: Secondary | ICD-10-CM

## 2013-05-21 DIAGNOSIS — E119 Type 2 diabetes mellitus without complications: Secondary | ICD-10-CM

## 2013-05-21 DIAGNOSIS — J302 Other seasonal allergic rhinitis: Secondary | ICD-10-CM

## 2013-05-21 DIAGNOSIS — J209 Acute bronchitis, unspecified: Secondary | ICD-10-CM

## 2013-05-21 DIAGNOSIS — M25562 Pain in left knee: Secondary | ICD-10-CM

## 2013-05-21 DIAGNOSIS — I129 Hypertensive chronic kidney disease with stage 1 through stage 4 chronic kidney disease, or unspecified chronic kidney disease: Secondary | ICD-10-CM | POA: Insufficient documentation

## 2013-05-21 LAB — POCT URINALYSIS DIPSTICK
Bilirubin, UA: NEGATIVE
Blood, UA: NEGATIVE
Glucose, UA: NEGATIVE
Ketones, UA: NEGATIVE
Nitrite, UA: NEGATIVE
Protein, UA: NEGATIVE
Spec Grav, UA: 1.01
Urobilinogen, UA: 0.2
pH, UA: 6

## 2013-05-21 MED ORDER — LORATADINE 10 MG PO TABS: 10.0000 mg | ORAL_TABLET | Freq: Every day | ORAL | Status: DC

## 2013-05-21 NOTE — Patient Instructions (Addendum)
F/u in 6  weeks, call if you need me before  Flu vaccine today   Urine is being checked today for infection.    STOP janumet, based on kidney function.Also stop lisinopril.You are also referred to kidney specialist   Continue glipizide 5mg  ,  Once daily, call if morning blood sugars are over 150, so that  I can adjust the does of your medication  Continue all other medications as before  Tylenol 2 tablets in office for knee pain

## 2013-05-23 LAB — URINE CULTURE
Colony Count: NO GROWTH
Organism ID, Bacteria: NO GROWTH

## 2013-05-26 DIAGNOSIS — N390 Urinary tract infection, site not specified: Secondary | ICD-10-CM | POA: Insufficient documentation

## 2013-05-26 DIAGNOSIS — M25562 Pain in left knee: Secondary | ICD-10-CM | POA: Insufficient documentation

## 2013-05-26 NOTE — Progress Notes (Signed)
  Subjective:    Patient ID: [name] [name], female    DOB: [birth year], 67 y.o.   MRN: [id number]  HPI The PT is here for follow up and re-evaluation of chronic medical conditions, medication management and review of any available recent lab and radiology data.  Preventive health is updated, specifically  Cancer screening and Immunization.   Reports resolution of head and chest congestion she was  Recently treated for The PT denies any adverse reactions to current medications since the last visit.  There are no new concerns.  C/o left knee pain and stiffness for past several days, no specific aggravating factor identified     Review of Systems See HPI Denies recent fever or chills. Denies sinus pressure, nasal congestion, ear pain or sore throat. Denies chest congestion, productive cough or wheezing. Denies chest pains, palpitations and leg swelling Denies abdominal pain, nausea, vomiting,diarrhea or constipation.   C/o mild dysuria and  Frequency,for past 2 days, denies  hesitancy or incontinence.  Denies headaches, seizures, numbness, or tingling. Denies depression, anxiety or insomnia. Denies skin break down or rash.        Objective:   Physical Exam  Patient alert and oriented and in no cardiopulmonary distress.  HEENT: No facial asymmetry, EOMI, no sinus tenderness,  oropharynx pink and moist.  Neck supple no adenopathy.  Chest: Clear to auscultation bilaterally.  CVS: S1, S2 no murmurs, no S3.  ABD: Soft non tender. Bowel sounds normal.No renal angle or supra  Pubic tenderness  Ext: No edema  MS: Adequate ROM spine, shoulders, hips and reduced in left  knee.  Skin: Intact, no ulcerations or rash noted.  Psych: Good eye contact, normal affect. Memory intact not anxious or depressed appearing.  CNS: CN 2-12 intact, power, tone and sensation normal throughout.       Assessment & Plan:

## 2013-05-26 NOTE — Assessment & Plan Note (Signed)
Controlled, but due to elevation in creatinine, needs to stop janumet , will be maintained on glipzide and is to call in if sugars are uncontrolled

## 2013-05-26 NOTE — Assessment & Plan Note (Signed)
Controlled, no change in medication  

## 2013-05-26 NOTE — Assessment & Plan Note (Signed)
Stable , no symptoms of decompensation at this time

## 2013-05-26 NOTE — Assessment & Plan Note (Signed)
Resolved, flu vaccine today

## 2013-05-26 NOTE — Assessment & Plan Note (Signed)
Uncontrolled, tylenol administered in office for pain relief

## 2013-05-26 NOTE — Assessment & Plan Note (Addendum)
Progressively worsening renal function, needs nephrology eval. Pt to d/c metformin and advised no anti inflammatories for pain

## 2013-05-26 NOTE — Assessment & Plan Note (Signed)
CCUA in office , negative for infection 

## 2013-05-26 NOTE — Assessment & Plan Note (Signed)
Uncontrolled Hyperlipidemia:Low fat diet discussed and encouraged.  No med change, compliance with med and diewt stressed

## 2013-06-12 ENCOUNTER — Telehealth: Payer: Self-pay | Admitting: Family Medicine

## 2013-06-14 NOTE — Telephone Encounter (Signed)
PATIENT IS AWARE 

## 2013-06-26 ENCOUNTER — Other Ambulatory Visit: Payer: Self-pay | Admitting: Physician Assistant

## 2013-07-04 ENCOUNTER — Ambulatory Visit: Payer: Medicare Other | Admitting: Family Medicine

## 2013-07-06 ENCOUNTER — Telehealth: Payer: Self-pay | Admitting: Family Medicine

## 2013-07-06 NOTE — Telephone Encounter (Signed)
Pt missed her appt last week. Labs done just before this showed uncontrolled cholesterol, better than before, but not at goal. I planned to address at the visit. Pls check her pharmacy and see what meds and doses she is taking for her cholesterol so this can be addressed.will sende you a msg for her when i get the info Also when you spk with her , if she has appt with nephrology, needs to keep this . I see her f/u is now early Jan ensure she is aware , thanks

## 2013-07-09 NOTE — Telephone Encounter (Signed)
Noted, thanks , I spoke to her yesterday, she is already on max doses , needs to reduce fried and fatty foods, the numbers are better but still not at goal (far from) just an [place]

## 2013-07-09 NOTE — Telephone Encounter (Signed)
Noted  

## 2013-07-09 NOTE — Telephone Encounter (Signed)
Called patient and no answer. Per pharmacy- patient taking  niacin 1000mg  1 qhs Fenofibrate 145mg  1 qd  lipitor 80mg  1 qd and has been collecting it monthly

## 2013-07-23 ENCOUNTER — Telehealth (INDEPENDENT_AMBULATORY_CARE_PROVIDER_SITE_OTHER): Payer: Medicare Other

## 2013-07-23 DIAGNOSIS — E119 Type 2 diabetes mellitus without complications: Secondary | ICD-10-CM

## 2013-07-23 NOTE — Telephone Encounter (Signed)
States her blood sugars have been running pretty good when she checks it several times a week, Today she checked her sugar and it was 335 and I had her check it again while I was on the phone and it was 351. She only takes the glipizide 5mg  xl daily. Please advise [phone]

## 2013-07-23 NOTE — Telephone Encounter (Signed)
Ensure has not been taking excessive sugar, take one extra glipizide 5 mg today and drink a lot of water ,enquire re uti symptoms or respiratory and document. Needs to bring in ua for analysis here iof nothing specific can explain symptom tomorrowe if BG tomorrow still high, hBa1C was excellent in 05/2013

## 2013-07-23 NOTE — Telephone Encounter (Signed)
Patient aware.  She states that she has no symptoms of infection.  Will stop by the office if blood sugar still elevated tomorrow.

## 2013-07-26 ENCOUNTER — Other Ambulatory Visit: Payer: Self-pay

## 2013-07-26 LAB — POCT URINALYSIS DIPSTICK
Bilirubin, UA: NEGATIVE
Blood, UA: NEGATIVE
Glucose, UA: 100
Ketones, UA: NEGATIVE
Leukocytes, UA: NEGATIVE
Nitrite, UA: NEGATIVE
Protein, UA: NEGATIVE
Spec Grav, UA: 1.015
Urobilinogen, UA: 0.2
pH, UA: 6

## 2013-07-26 MED ORDER — GLIPIZIDE ER 10 MG PO TB24: 10.0000 mg | ORAL_TABLET | Freq: Every day | ORAL | Status: DC

## 2013-07-26 MED ORDER — FUROSEMIDE 40 MG PO TABS: 40.0000 mg | ORAL_TABLET | Freq: Every day | ORAL | Status: DC

## 2013-07-26 NOTE — Telephone Encounter (Signed)
Patient called back and said her sugar is alittle better but still high ( 214 and 260 lastnight) Will come by today if she gets a chance to submit some urine and if normal I told her we would decide what to do about her medication at that time

## 2013-07-26 NOTE — Telephone Encounter (Signed)
Noted and agree. 

## 2013-07-26 NOTE — Telephone Encounter (Signed)
Please advise her to increase her glipizide to 5mg  TWO daily, can go ahead and tae the 2nd when you call her and tomorrow am take both together, they are long acting glipizide. I am entering new dose of 10mg  1 daily for you to send in after you spk with her so pharmacy knows she will be filling script , and explain to her to take oNE 10mg  tab when she gets them

## 2013-07-26 NOTE — Addendum Note (Signed)
Addended by: Denman George B on: 07/26/2013 03:02 PM   Modules accepted: Orders

## 2013-07-26 NOTE — Addendum Note (Signed)
Addended by: Fayrene Helper on: 07/26/2013 03:38 PM   Modules accepted: Orders

## 2013-07-29 ENCOUNTER — Other Ambulatory Visit: Payer: Self-pay

## 2013-07-29 ENCOUNTER — Emergency Department (HOSPITAL_COMMUNITY)
Admission: EM | Admit: 2013-07-29 | Discharge: 2013-07-29 | Disposition: A | Discharge status: Home / Self Care | Payer: Medicare Other | Attending: Emergency Medicine | Admitting: Emergency Medicine

## 2013-07-29 ENCOUNTER — Telehealth: Payer: Self-pay | Admitting: Family Medicine

## 2013-07-29 ENCOUNTER — Encounter (HOSPITAL_COMMUNITY): Payer: Self-pay | Admitting: Emergency Medicine

## 2013-07-29 DIAGNOSIS — I10 Essential (primary) hypertension: Secondary | ICD-10-CM | POA: Insufficient documentation

## 2013-07-29 DIAGNOSIS — M1710 Unilateral primary osteoarthritis, unspecified knee: Secondary | ICD-10-CM | POA: Insufficient documentation

## 2013-07-29 DIAGNOSIS — Z95818 Presence of other cardiac implants and grafts: Secondary | ICD-10-CM | POA: Insufficient documentation

## 2013-07-29 DIAGNOSIS — R739 Hyperglycemia, unspecified: Secondary | ICD-10-CM

## 2013-07-29 DIAGNOSIS — K0820 Unspecified atrophy of edentulous alveolar ridge: Secondary | ICD-10-CM | POA: Insufficient documentation

## 2013-07-29 DIAGNOSIS — Z880 Allergy status to penicillin: Secondary | ICD-10-CM | POA: Insufficient documentation

## 2013-07-29 DIAGNOSIS — Z7982 Long term (current) use of aspirin: Secondary | ICD-10-CM | POA: Insufficient documentation

## 2013-07-29 DIAGNOSIS — Z79899 Other long term (current) drug therapy: Secondary | ICD-10-CM | POA: Insufficient documentation

## 2013-07-29 DIAGNOSIS — E1165 Type 2 diabetes mellitus with hyperglycemia: Secondary | ICD-10-CM

## 2013-07-29 DIAGNOSIS — Z8701 Personal history of pneumonia (recurrent): Secondary | ICD-10-CM | POA: Insufficient documentation

## 2013-07-29 DIAGNOSIS — IMO0002 Reserved for concepts with insufficient information to code with codable children: Secondary | ICD-10-CM

## 2013-07-29 DIAGNOSIS — E785 Hyperlipidemia, unspecified: Secondary | ICD-10-CM | POA: Insufficient documentation

## 2013-07-29 DIAGNOSIS — R011 Cardiac murmur, unspecified: Secondary | ICD-10-CM | POA: Insufficient documentation

## 2013-07-29 DIAGNOSIS — E119 Type 2 diabetes mellitus without complications: Secondary | ICD-10-CM | POA: Insufficient documentation

## 2013-07-29 LAB — CBC WITH DIFFERENTIAL/PLATELET
Basophils Absolute: 0.1 10*3/uL (ref 0.0–0.1)
Basophils Relative: 2 % — ABNORMAL HIGH (ref 0–1)
Eosinophils Absolute: 0.1 10*3/uL (ref 0.0–0.7)
Eosinophils Relative: 3 % (ref 0–5)
HCT: 32.8 % — ABNORMAL LOW (ref 36.0–46.0)
Hemoglobin: 10.5 g/dL — ABNORMAL LOW (ref 12.0–15.0)
Lymphocytes Relative: 38 % (ref 12–46)
Lymphs Abs: 2 10*3/uL (ref 0.7–4.0)
MCH: 25.3 pg — ABNORMAL LOW (ref 26.0–34.0)
MCHC: 32 g/dL (ref 30.0–36.0)
MCV: 79 fL (ref 78.0–100.0)
Monocytes Absolute: 0.4 10*3/uL (ref 0.1–1.0)
Monocytes Relative: 7 % (ref 3–12)
Neutro Abs: 2.6 10*3/uL (ref 1.7–7.7)
Neutrophils Relative %: 50 % (ref 43–77)
Platelets: 235 10*3/uL (ref 150–400)
RBC: 4.15 MIL/uL (ref 3.87–5.11)
RDW: 15.2 % (ref 11.5–15.5)
WBC: 5.3 10*3/uL (ref 4.0–10.5)

## 2013-07-29 LAB — BASIC METABOLIC PANEL
BUN: 23 mg/dL (ref 6–23)
CO2: 25 mEq/L (ref 19–32)
Calcium: 10.2 mg/dL (ref 8.4–10.5)
Chloride: 98 mEq/L (ref 96–112)
Creatinine, Ser: 1.99 mg/dL — ABNORMAL HIGH (ref 0.50–1.10)
GFR calc Af Amer: 29 mL/min — ABNORMAL LOW (ref >90)
GFR calc non Af Amer: 25 mL/min — ABNORMAL LOW (ref >90)
Glucose, Bld: 310 mg/dL — ABNORMAL HIGH (ref 70–99)
Potassium: 3.5 mEq/L (ref 3.5–5.1)
Sodium: 136 mEq/L (ref 135–145)

## 2013-07-29 LAB — GLUCOSE, CAPILLARY
Glucose-Capillary: 338 mg/dL — ABNORMAL HIGH (ref 70–99)
Glucose-Capillary: 245 mg/dL — ABNORMAL HIGH (ref 70–99)

## 2013-07-29 MED ORDER — INSULIN ASPART 100 UNIT/ML ~~LOC~~ SOLN
10.0000 [IU] | Freq: Once | SUBCUTANEOUS | Status: AC
  Administered 2013-07-29: 10 [IU] via SUBCUTANEOUS

## 2013-07-29 MED ORDER — GLIPIZIDE ER 10 MG PO TB24: 10.0000 mg | ORAL_TABLET | Freq: Every day | ORAL | Status: DC

## 2013-07-29 NOTE — Telephone Encounter (Signed)
Pls contact pt, let her know that i am aware she was seen in Ed due to elevated blood suagrs, since still a problem I recommend she see endo and will refer her to dr Dorris Fetch to nmanage the Dm, let referral staff know after you speak with her pls, referrla entered

## 2013-07-29 NOTE — ED Provider Notes (Signed)
CSN: [id number]     Arrival date & time 07/29/13  1613 History   First MD Initiated Contact with Patient 07/29/13 1914      This chart was scribed for [name] Diego, MD by Forrestine Him, ED Scribe. This patient was seen in room APA10/APA10 and the patient's care was started 7:21 PM.   Chief Complaint  Patient presents with  . Hyperglycemia   Patient is a 67 y.o. female presenting with hyperglycemia. The history is provided by the patient. No language interpreter was used.  Hyperglycemia Blood sugar level PTA:  390 Severity:  Moderate Onset quality:  Gradual Duration:  1 week Timing:  Constant Progression:  Partially resolved Chronicity:  Recurrent Context: change in medication   Associated symptoms: no abdominal pain, no chest pain and no fatigue     HPI Comments: [name] [name] is a 67 y.o. female with h/o HTN, hyperlipidemia, aortic stenosis, and heart murmur who presents to the Emergency Department complaining of gradual onset, unchanged hyperglycemia that started last week. Pt states she "cant seem to get her sugar to come down". She says at its highest, her sugar has been 390 this past week, but was 302 this morning, and 360 at time of arrival. She states she feels her high sugar levels are causing her other complications. She says she has seen Dr. Moshe Cipro in the last 2 week regarding a similar complaint. At that time, she states some of her medications were changed. She denies any other complains at this time.   Past Medical History  Diagnosis Date  . 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 Hyperlipidemia   . Anxiety   . Aortic stenosis 2009    Echocardiogram 2009-normal EF, mild AS; negative stress nuclear in 2004; negative carotid duplex study in 2009; echo 10/2011-mild LVH, AS-IS-apical akinesis with EF of 20-25%,  mild to mod. AS  . Pneumonia 10/2011    Hospitalized  . Palpitations     freq. PVCs  . Cardiomyopathy 10/2011    EF of  20-25% on echo in 10/2011  . Systolic dysfunction [id number]    severe  . Heart murmur   . Shortness of breath   . Diabetes mellitus, type 2     insulin dependent  . Arthritis     in knee   Past Surgical History  Procedure Laterality Date  . Tubal ligation    . Cesarean section    . Cardiac catheterization  12/05/2011   Family History  Problem Relation Age of Onset  . Arthritis Other     Family History  . Heart disease Mother   . Heart attack Mother   . Diabetes Mother   . Hypertension Mother   . Cancer Father     colon  . Cancer Sister     breast  . Diabetes Brother   . Hypertension Brother   . Stroke Brother    History  Substance Use Topics  . Smoking status: Never Smoker   . Smokeless tobacco: Never Used  . Alcohol Use: No   OB History   Grav Para Term Preterm Abortions TAB SAB Ect Mult Living                 Review of Systems  Constitutional: Negative for appetite change and fatigue.  HENT: Negative for congestion, ear discharge and sinus pressure.   Eyes: Negative for discharge.  Respiratory: Negative for cough.   Cardiovascular: Negative for  chest pain.  Gastrointestinal: Negative for abdominal pain and diarrhea.  Genitourinary: Negative for frequency and hematuria.  Musculoskeletal: Negative for back pain.  Skin: Negative for rash.  Neurological: Negative for seizures and headaches.  Psychiatric/Behavioral: Negative for hallucinations.    Allergies  Penicillins  Home Medications   Current Outpatient Rx  Name  Route  Sig  Dispense  Refill  . aspirin EC 81 MG tablet   Oral   Take 1 tablet (81 mg total) by mouth daily.   150 tablet   2   . atorvastatin (LIPITOR) 80 MG tablet   Oral   Take 80 mg by mouth at bedtime.         . carvedilol (COREG) 25 MG tablet   Oral   Take 25 mg by mouth 2 (two) times daily.         . fenofibrate (TRICOR) 145 MG tablet   Oral   Take 145 mg by mouth every morning.         . furosemide (LASIX) 40 MG  tablet   Oral   Take 40 mg by mouth every morning.         Marland Kitchen glipiZIDE (GLUCOTROL XL) 5 MG 24 hr tablet   Oral   Take 10 mg by mouth every morning.          Marland Kitchen lisinopril (PRINIVIL,ZESTRIL) 2.5 MG tablet   Oral   Take 2.5 mg by mouth every morning.         . Multiple Vitamin (MULITIVITAMIN WITH MINERALS) TABS   Oral   Take 1 tablet by mouth every morning.         . niacin (NIASPAN) 1000 MG CR tablet   Oral   Take 1 tablet (1,000 mg total) by mouth at bedtime.   30 tablet   3   . spironolactone (ALDACTONE) 25 MG tablet   Oral   Take 25 mg by mouth every morning.          Triage Vitals: BP 128/77  Pulse 72  Temp(Src) 98.6 F (37 C) (Oral)  Resp 18  Ht 5\' 2"  (1.575 m)  Wt 168 lb (76.204 kg)  BMI 30.72 kg/m2  SpO2 99%  Physical Exam  Constitutional: She is oriented to person, place, and time. She appears well-developed.  HENT:  Head: Normocephalic.  Edentulous   Eyes: Conjunctivae and EOM are normal. No scleral icterus.  Neck: Neck supple. No thyromegaly present.  Cardiovascular: Exam reveals no gallop and no friction rub.   Murmur heard. 2/6 systolic murmur  Pulmonary/Chest: No stridor. She has no wheezes. She has no rales. She exhibits no tenderness.  Abdominal: She exhibits no distension. There is no tenderness. There is no rebound.  Musculoskeletal: Normal range of motion. She exhibits no edema.  Lymphadenopathy:    She has no cervical adenopathy.  Neurological: She is oriented to person, place, and time. She exhibits normal muscle tone. Coordination normal.  Skin: No rash noted. No erythema.  Psychiatric: She has a normal mood and affect. Her behavior is normal.    ED Course  Procedures (including critical care time)  DIAGNOSTIC STUDIES: Oxygen Saturation is 99% on RA, Normal by my interpretation.    COORDINATION OF CARE: 7:29 PM- Will order blood panel. Will give insulin aspart. Discussed treatment plan with pt at bedside and pt agreed to  plan.     Labs Review Labs Reviewed  GLUCOSE, CAPILLARY - Abnormal; Notable for the following:    Glucose-Capillary 338 (*)  All other components within normal limits  CBC WITH DIFFERENTIAL  BASIC METABOLIC PANEL   Imaging Review No results found.  EKG Interpretation   None       MDM  Hyperglycemia         The chart was scribed for me under my direct supervision.  I personally performed the history, physical, and medical decision making and all procedures in the evaluation of this patient.[name] Diego, MD 07/29/13 [phone]

## 2013-07-29 NOTE — ED Notes (Signed)
Went to room to introduce myself and ask if patient needed anything.  She asked for more water.  I retrieved it for her.

## 2013-07-29 NOTE — Telephone Encounter (Signed)
Patient aware and she understands to take 2 of her current 5mg  tabs and when she fills her new script to only take one

## 2013-07-29 NOTE — ED Notes (Signed)
States blood sugar running high for about a week and has seen Dr. Moshe Cipro earlier, states CBG 302 this morning and PTA 360

## 2013-07-29 NOTE — ED Notes (Signed)
Pt reporting elevated blood sugar since change in medications.  Denies nausea or vomiting at this time.  Denies any additional concerns at present.

## 2013-07-29 NOTE — ED Notes (Signed)
Pt. Reports recent change in oral diabetes medication. Pt. States that her sugar has been uncontrolled since changing medication. Pt. States that she "just feels bad". Pt. Reports that prior to changing medications her sugars were usually in the 100s.

## 2013-07-30 NOTE — Telephone Encounter (Signed)
Patient aware.

## 2013-08-01 ENCOUNTER — Other Ambulatory Visit: Payer: Self-pay

## 2013-08-01 MED ORDER — GLUCOSE BLOOD VI STRP: ORAL_STRIP | Status: DC

## 2013-08-20 ENCOUNTER — Ambulatory Visit: Payer: Medicare Other | Admitting: Family Medicine

## 2013-09-03 ENCOUNTER — Other Ambulatory Visit: Payer: Self-pay | Admitting: Family Medicine

## 2013-09-13 ENCOUNTER — Telehealth: Payer: Self-pay | Admitting: Family Medicine

## 2013-09-13 DIAGNOSIS — E785 Hyperlipidemia, unspecified: Secondary | ICD-10-CM

## 2013-09-13 DIAGNOSIS — E119 Type 2 diabetes mellitus without complications: Secondary | ICD-10-CM

## 2013-09-13 NOTE — Telephone Encounter (Signed)
Please advise if patient needs labs

## 2013-09-13 NOTE — Telephone Encounter (Signed)
Called and left voicemail for patient notifying of the need for labs and that order has been faxed to [facility].  She needs to be fasting.

## 2013-09-13 NOTE — Telephone Encounter (Signed)
Needs fasting lipid, cmp and EGFr, microalb and hBa1C pls

## 2013-09-13 NOTE — Addendum Note (Signed)
Addended by: Denman George B on: 09/13/2013 02:17 PM   Modules accepted: Orders

## 2013-09-20 ENCOUNTER — Ambulatory Visit (INDEPENDENT_AMBULATORY_CARE_PROVIDER_SITE_OTHER): Payer: Medicare Other | Admitting: Family Medicine

## 2013-09-20 ENCOUNTER — Encounter: Payer: Self-pay | Admitting: Family Medicine

## 2013-09-20 ENCOUNTER — Encounter (INDEPENDENT_AMBULATORY_CARE_PROVIDER_SITE_OTHER): Payer: Self-pay

## 2013-09-20 VITALS — BP 120/70 | HR 91 | Resp 16 | Ht 65.25 in | Wt 175.1 lb

## 2013-09-20 DIAGNOSIS — I10 Essential (primary) hypertension: Secondary | ICD-10-CM

## 2013-09-20 DIAGNOSIS — E785 Hyperlipidemia, unspecified: Secondary | ICD-10-CM

## 2013-09-20 DIAGNOSIS — E669 Obesity, unspecified: Secondary | ICD-10-CM

## 2013-09-20 DIAGNOSIS — G4700 Insomnia, unspecified: Secondary | ICD-10-CM

## 2013-09-20 DIAGNOSIS — E119 Type 2 diabetes mellitus without complications: Secondary | ICD-10-CM

## 2013-09-20 MED ORDER — TEMAZEPAM 7.5 MG PO CAPS: 7.5000 mg | ORAL_CAPSULE | Freq: Every evening | ORAL | Status: DC | PRN

## 2013-09-20 NOTE — Patient Instructions (Signed)
Annual wellness in 4 month, call if you need me before  Microalb from office today   Blood sugar much improved, continue to test regularly and eat as you should.  Medication sent in for sleep   I hope that your husband's health continues to improve

## 2013-09-21 LAB — MICROALBUMIN / CREATININE URINE RATIO
Creatinine, Urine: 36.1 mg/dL
Microalb Creat Ratio: 13.9 mg/g (ref 0.0–30.0)
Microalb, Ur: 0.5 mg/dL (ref 0.00–1.89)

## 2013-09-21 NOTE — Assessment & Plan Note (Signed)
Increased anxiety due to spouse's ill health is the major contributor. Sleep hygiene discussed, pt to start restoril , low dose

## 2013-09-21 NOTE — Assessment & Plan Note (Signed)
Controlled, no change in medication DASH diet and commitment to daily physical activity for a minimum of 30 minutes discussed and encouraged, as a part of hypertension management. The importance of attaining a healthy weight is also discussed.  

## 2013-09-21 NOTE — Assessment & Plan Note (Signed)
Deteriorated. Patient re-educated about  the importance of commitment to a  minimum of 150 minutes of exercise per week. The importance of healthy food choices with portion control discussed. Encouraged to start a food diary, count calories and to consider  joining a support group. Sample diet sheets offered. Goals set by the patient for the next several months.    

## 2013-09-21 NOTE — Progress Notes (Signed)
Subjective:    Patient ID: [name] [name], female    DOB: Aug 28, 1945, 68 y.o.   MRN: [id number]  HPI The PT is here for follow up and re-evaluation of chronic medical conditions, medication management and review of any available recent lab and radiology data.  Preventive health is updated, specifically  Cancer screening and Immunization.   Questions or concerns regarding consultations or procedures which the PT has had in the interim are  Addressed.happy with improved blood sugars, concerned about cost of new med and also added cost of specialist care The PT denies any adverse reactions to current medications since the last visit.  C/o increased difficulty with sleep due to worry over spouse's health, requests med to help      Review of Systems See HPI Denies recent fever or chills. Denies sinus pressure, nasal congestion, ear pain or sore throat. Denies chest congestion, productive cough or wheezing. Denies chest pains, palpitations and leg swelling Denies abdominal pain, nausea, vomiting,diarrhea or constipation.   Denies dysuria, frequency, hesitancy or incontinence. Denies uncontrolled  joint pain, swelling and limitation in mobility. Denies headaches, seizures, numbness, or tingling.  Denies skin break down or rash.        Objective:   Physical Exam BP 120/70  Pulse 91  Resp 16  Ht 5' 5.25" (1.657 m)  Wt 175 lb 1.9 oz (79.434 kg)  BMI 28.93 kg/m2  SpO2 99% Patient alert and oriented and in no cardiopulmonary distress.  HEENT: No facial asymmetry, EOMI, no sinus tenderness,  oropharynx pink and moist.  Neck supple no adenopathy.  Chest: Clear to auscultation bilaterally.  CVS: S1, S2 no murmurs, no S3.  ABD: Soft non tender. Bowel sounds normal.  Ext: No edema  MS: Adequate ROM spine, shoulders, hips and knees.  Skin: Intact, no ulcerations or rash noted.  Psych: Good eye contact, normal affect. Memory intact not anxious or depressed appearing.  CNS:  CN 2-12 intact, power, tone and sensation normal throughout.        Assessment & Plan:  Hypertension Controlled, no change in medication DASH diet and commitment to daily physical activity for a minimum of 30 minutes discussed and encouraged, as a part of hypertension management. The importance of attaining a healthy weight is also discussed.   DIABETES MELLITUS, TYPE II Improved control based on blood glucose values pt presents with. Trajenta has been added by endo, and she states she is being more diligent with diet  Also checking numbers. Review shows fasting sugars generally between 100 to 130. In December , she had several episodes of hyperglycemia which triggered her referral to endo, now requests to be followed in this office due to cop ay, also she is worried about cost of tragenta. She is advised to keep f/u appt with endo at least once which she will do, states she does like the Doc Patient advised to reduce carb and sweets, commit to regular physical activity, take meds as prescribed, test blood as directed, and attempt to lose weight, to improve blood sugar control.   HYPERLIPIDEMIA Uncontrolled Hyperlipidemia:Low fat diet discussed and encouraged.  Updated lab with next draw  Insomnia Increased anxiety due to spouse's ill health is the major contributor. Sleep hygiene discussed, pt to start restoril , low dose  Obesity Deteriorated. Patient re-educated about  the importance of commitment to a  minimum of 150 minutes of exercise per week. The importance of healthy food choices with portion control discussed. Encouraged to start a food diary,  count calories and to consider  joining a support group. Sample diet sheets offered. Goals set by the patient for the next several months.

## 2013-09-21 NOTE — Assessment & Plan Note (Signed)
Uncontrolled Hyperlipidemia:Low fat diet discussed and encouraged.  Updated lab with next draw

## 2013-09-21 NOTE — Assessment & Plan Note (Signed)
Improved control based on blood glucose values pt presents with. Trajenta has been added by endo, and she states she is being more diligent with diet  Also checking numbers. Review shows fasting sugars generally between 100 to 130. In December , she had several episodes of hyperglycemia which triggered her referral to endo, now requests to be followed in this office due to cop ay, also she is worried about cost of tragenta. She is advised to keep f/u appt with endo at least once which she will do, states she does like the Doc Patient advised to reduce carb and sweets, commit to regular physical activity, take meds as prescribed, test blood as directed, and attempt to lose weight, to improve blood sugar control.

## 2013-10-07 ENCOUNTER — Other Ambulatory Visit: Payer: Self-pay | Admitting: Family Medicine

## 2013-10-25 ENCOUNTER — Telehealth: Payer: Self-pay | Admitting: Cardiovascular Disease

## 2013-10-25 ENCOUNTER — Other Ambulatory Visit: Payer: Self-pay | Admitting: Family Medicine

## 2013-10-25 MED ORDER — SPIRONOLACTONE 25 MG PO TABS: 25.0000 mg | ORAL_TABLET | Freq: Every morning | ORAL | Status: DC

## 2013-10-25 NOTE — Telephone Encounter (Signed)
Spironalactone refill complete

## 2013-10-25 NOTE — Telephone Encounter (Signed)
Received fax refill request  Rx # [id number] Medication:  Spironolactone 25 mg tablet  Qty 30 Sig:  Take one tablet by mouth once daily Physician:  Bronson Ing

## 2013-11-14 ENCOUNTER — Encounter: Payer: Self-pay | Admitting: Adult Health

## 2013-11-14 ENCOUNTER — Ambulatory Visit (INDEPENDENT_AMBULATORY_CARE_PROVIDER_SITE_OTHER): Payer: Medicare Other | Admitting: Adult Health

## 2013-11-14 VITALS — BP 115/52 | HR 86 | Ht 62.0 in | Wt 177.0 lb

## 2013-11-14 DIAGNOSIS — I255 Ischemic cardiomyopathy: Secondary | ICD-10-CM

## 2013-11-14 DIAGNOSIS — I10 Essential (primary) hypertension: Secondary | ICD-10-CM

## 2013-11-14 DIAGNOSIS — I2589 Other forms of chronic ischemic heart disease: Secondary | ICD-10-CM

## 2013-11-14 DIAGNOSIS — I359 Nonrheumatic aortic valve disorder, unspecified: Secondary | ICD-10-CM

## 2013-11-14 DIAGNOSIS — I350 Nonrheumatic aortic (valve) stenosis: Secondary | ICD-10-CM

## 2013-11-14 DIAGNOSIS — I5022 Chronic systolic (congestive) heart failure: Secondary | ICD-10-CM

## 2013-11-14 NOTE — Assessment & Plan Note (Signed)
There is no evidence of fluid overload, or decompensation. She does have some mild fatigue with exertion, but denies any significant shortness of breath. She is medically compliant. Her weights have been stable. We will continue her on her current medication regimen as directed. Our plan will be to repeat her echocardiogram for LV systolic function. Currently she continues to refuse ICD pacemaker plantation, and she is having to care for grandchildren and her husband who is an invalid. She does not think that she will be able to take the time to have this surgery. We will revisit this once echocardiogram is completed to see if this is needed.

## 2013-11-14 NOTE — Patient Instructions (Signed)
Your physician recommends that you schedule a follow-up appointment in:  2 weeks   Your physician has requested that you have an echocardiogram. Echocardiography is a painless test that uses sound waves to create images of your heart. It provides your doctor with information about the size and shape of your heart and how well your heart's chambers and valves are working. This procedure takes approximately one hour. There are no restrictions for this procedure.    Your physician recommends that you continue on your current medications as directed. Please refer to the Current Medication list given to you today.    Thank you for choosing [facility] !

## 2013-11-14 NOTE — Assessment & Plan Note (Signed)
Blood pressure is currently controlled without evidence of orthostatic hypotension or near syncope with position changes. 2 continue carvedilol twice a day Lasix, spironolactone. She has recently had labs drawn by primary care physician

## 2013-11-14 NOTE — Progress Notes (Deleted)
Name: [name] [name]    DOB: 05-28-1946  Age: 68 y.o.  MR#: [id number]       PCP:  [name], MD      Insurance: Payor: [employer] / Plan: BLUE MEDICARE / Product Type: *No Product type* /   CC:    Chief Complaint  Patient presents with  . Hypertension  . Cardiac Valve Problem  . Congestive Heart Failure    VS Filed Vitals:   11/14/13 1430  BP: 115/52  Pulse: 86  Height: 5\' 2"  (1.575 m)  Weight: 177 lb (80.287 kg)    Weights Current Weight  11/14/13 177 lb (80.287 kg)  09/20/13 175 lb 1.9 oz (79.434 kg)  07/29/13 168 lb (76.204 kg)    Blood Pressure  BP Readings from Last 3 Encounters:  11/14/13 115/52  09/20/13 120/70  07/29/13 113/83     Admit date:  (Not on file) Last encounter with RMR:  Visit date not found   Allergy Penicillins  Current Outpatient Prescriptions  Medication Sig Dispense Refill  . aspirin EC 81 MG tablet Take 1 tablet (81 mg total) by mouth daily.  150 tablet  2  . atorvastatin (LIPITOR) 80 MG tablet TAKE ONE TABLET DAILY.  30 tablet  3  . carvedilol (COREG) 25 MG tablet Take 25 mg by mouth 2 (two) times daily.      . fenofibrate (TRICOR) 145 MG tablet Take 145 mg by mouth every morning.      . furosemide (LASIX) 40 MG tablet Take 40 mg by mouth every morning.      Marland Kitchen glucose blood (EASYMAX TEST) test strip Use as instructed for once daily testing dx 250.00  50 each  5  . insulin detemir (LEVEMIR) 100 UNIT/ML injection Inject 15 Units into the skin at bedtime.      Marland Kitchen linagliptin (TRADJENTA) 5 MG TABS tablet Take 5 mg by mouth daily.      Marland Kitchen lisinopril (PRINIVIL,ZESTRIL) 2.5 MG tablet Take 2.5 mg by mouth every morning.      . Multiple Vitamin (MULITIVITAMIN WITH MINERALS) TABS Take 1 tablet by mouth every morning.      . niacin (NIASPAN) 1000 MG CR tablet TAKE (1) TABLET BY MOUTH AT BEDTIME.  30 tablet  1  . spironolactone (ALDACTONE) 25 MG tablet Take 1 tablet (25 mg total) by mouth every morning.  90 tablet  3   . [DISCONTINUED] cetirizine (ZYRTEC) 10 MG tablet Take 1 tablet (10 mg total) by mouth daily.  30 tablet  3  . [DISCONTINUED] ferrous sulfate 325 (65 FE) MG EC tablet Take 1 tablet (325 mg total) by mouth 2 (two) times daily.  60 tablet  5  . [DISCONTINUED] FLUoxetine (PROZAC) 10 MG tablet Take 1 tablet (10 mg total) by mouth daily. Take one capsule by mouth once a day  30 tablet  3  . [DISCONTINUED] fluticasone (FLONASE) 50 MCG/ACT nasal spray Place 2 sprays into the nose daily.  16 g  3   No current facility-administered medications for this visit.    Discontinued Meds:    Medications Discontinued During This Encounter  Medication Reason  . temazepam (RESTORIL) 7.5 MG capsule Error  . linagliptin (TRADJENTA) 5 MG TABS tablet Error  . glipiZIDE (GLUCOTROL XL) 10 MG 24 hr tablet Error    Patient Active Problem List   Diagnosis Date Noted  . Left knee pain 05/26/2013  . CKD (chronic kidney disease) stage 3, GFR 30-59 ml/min  05/21/2013  . Routine general medical examination at a health care facility 07/28/2012  . Chronic systolic heart failure 0000000  . Seasonal allergies 02/22/2012  . Hypertension   . Aortic stenosis   . Anemia 10/28/2011  . Cardiomyopathy + LBBB 10/14/2011  . Insomnia 03/22/2011  . DIABETES MELLITUS, TYPE II 10/24/2007  . HYPERLIPIDEMIA 10/24/2007  . Obesity 10/24/2007    LABS    Component Value Date/Time   NA 136 07/29/2013 1852   NA 140 05/20/2013 0918   NA 139 12/14/2012 1010   K 3.5 07/29/2013 1852   K 3.7 05/20/2013 0918   K 4.4 12/14/2012 1010   CL 98 07/29/2013 1852   CL 104 05/20/2013 0918   CL 104 12/14/2012 1010   CO2 25 07/29/2013 1852   CO2 29 05/20/2013 0918   CO2 25 12/14/2012 1010   GLUCOSE 310* 07/29/2013 1852   GLUCOSE 124* 05/20/2013 0918   GLUCOSE 94 12/14/2012 1010   BUN 23 07/29/2013 1852   BUN 23 05/20/2013 0918   BUN 33* 12/14/2012 1010   CREATININE 1.99* 07/29/2013 1852   CREATININE 1.67* 05/20/2013 0918   CREATININE 1.61* 12/14/2012  1010   CREATININE 1.48* 07/25/2012 1010   CREATININE 1.30* 12/07/2011 0448   CREATININE 1.20* 12/05/2011 1028   CALCIUM 10.2 07/29/2013 1852   CALCIUM 9.6 05/20/2013 0918   CALCIUM 9.9 12/14/2012 1010   GFRNONAA 25* 07/29/2013 1852   GFRNONAA 31* 05/20/2013 0918   GFRNONAA 33* 12/14/2012 1010   GFRNONAA 55* 07/25/2012 1010   GFRNONAA 42* 12/07/2011 0448   GFRNONAA 46* 12/05/2011 1028   GFRAA 29* 07/29/2013 1852   GFRAA 36* 05/20/2013 0918   GFRAA 38* 12/14/2012 1010   GFRAA 63 07/25/2012 1010   GFRAA 48* 12/07/2011 0448   GFRAA 53* 12/05/2011 1028   CMP     Component Value Date/Time   NA 136 07/29/2013 1852   K 3.5 07/29/2013 1852   CL 98 07/29/2013 1852   CO2 25 07/29/2013 1852   GLUCOSE 310* 07/29/2013 1852   BUN 23 07/29/2013 1852   CREATININE 1.99* 07/29/2013 1852   CREATININE 1.67* 05/20/2013 0918   CALCIUM 10.2 07/29/2013 1852   PROT 6.5 05/20/2013 0918   ALBUMIN 3.9 05/20/2013 0918   AST 27 05/20/2013 0918   ALT 25 05/20/2013 0918   ALKPHOS 24* 05/20/2013 0918   BILITOT 0.4 05/20/2013 0918   GFRNONAA 25* 07/29/2013 1852   GFRNONAA 31* 05/20/2013 0918   GFRAA 29* 07/29/2013 1852   GFRAA 36* 05/20/2013 0918       Component Value Date/Time   WBC 5.3 07/29/2013 1852   WBC 5.1 05/20/2013 0918   WBC 4.4 12/14/2012 1010   HGB 10.5* 07/29/2013 1852   HGB 9.3* 05/20/2013 0918   HGB 9.9* 12/14/2012 1010   HCT 32.8* 07/29/2013 1852   HCT 28.3* 05/20/2013 0918   HCT 30.5* 12/14/2012 1010   MCV 79.0 07/29/2013 1852   MCV 79.1 05/20/2013 0918   MCV 78.0 12/14/2012 1010    Lipid Panel     Component Value Date/Time   CHOL 223* 05/20/2013 0918   TRIG 58 05/20/2013 0918   HDL 50 05/20/2013 0918   CHOLHDL 4.5 05/20/2013 0918   VLDL 12 05/20/2013 0918   LDLCALC 161* 05/20/2013 0918    ABG    Component Value Date/Time   PHART 7.435* 12/05/2011 1247   PCO2ART 35.3 12/05/2011 1247   PO2ART 73.0* 12/05/2011 1247   HCO3 23.6 12/05/2011 1247   TCO2 25 12/05/2011  1247   ACIDBASEDEF 3.0* 12/01/2011 0025    O2SAT 95.0 12/05/2011 1247     Lab Results  Component Value Date   TSH 2.515 12/14/2012   BNP (last 3 results) No results found for this basename: PROBNP,  in the last 8760 hours Cardiac Panel (last 3 results) No results found for this basename: CKTOTAL, CKMB, TROPONINI, RELINDX,  in the last 72 hours  Iron/TIBC/Ferritin    Component Value Date/Time   IRON 47 05/20/2013 0918   TIBC 342 12/05/2011 0440   FERRITIN 21 12/05/2011 0440     EKG Orders placed in visit on 06/20/12  . EKG 12-LEAD     Prior Assessment and Plan Problem List as of 11/14/2013     Cardiovascular and Mediastinum   Hypertension   Last Assessment & Plan   09/20/2013 Office Visit Written 09/21/2013  7:56 PM by Fayrene Helper, MD     Controlled, no change in medication DASH diet and commitment to daily physical activity for a minimum of 30 minutes discussed and encouraged, as a part of hypertension management. The importance of attaining a healthy weight is also discussed.     Aortic stenosis   Last Assessment & Plan   05/17/2013 Office Visit Edited 05/17/2013  1:32 PM by Lendon Colonel, NP     Will need to repeat echo in 6 months for further evaluation. She is not short of breath. Heart rate is up today. Would like it to be in the 60-70 range.  May consider adding digoxin if creatinine status is normal.     Cardiomyopathy + LBBB   Last Assessment & Plan   06/20/2012 Office Visit Written 06/20/2012  1:10 PM by Imogene Burn, PA     Scheduled to see [name] back in January    Chronic systolic heart failure   Last Assessment & Plan   05/21/2013 Office Visit Written 05/26/2013  5:31 PM by Fayrene Helper, MD     Stable , no symptoms of decompensation at this time      Respiratory   Seasonal allergies   Last Assessment & Plan   05/21/2013 Office Visit Written 05/26/2013  5:25 PM by Fayrene Helper, MD     Controlled, no change in medication       Endocrine   DIABETES MELLITUS, TYPE II   Last  Assessment & Plan   09/20/2013 Office Visit Written 09/21/2013  8:00 PM by Fayrene Helper, MD     Improved control based on blood glucose values pt presents with. Trajenta has been added by endo, and she states she is being more diligent with diet  Also checking numbers. Review shows fasting sugars generally between 100 to 130. In December , she had several episodes of hyperglycemia which triggered her referral to endo, now requests to be followed in this office due to cop ay, also she is worried about cost of tragenta. She is advised to keep f/u appt with endo at least once which she will do, states she does like the Doc Patient advised to reduce carb and sweets, commit to regular physical activity, take meds as prescribed, test blood as directed, and attempt to lose weight, to improve blood sugar control.       Genitourinary   CKD (chronic kidney disease) stage 3, GFR 30-59 ml/min   Last Assessment & Plan   05/21/2013 Office Visit Edited 05/26/2013  5:28 PM by Fayrene Helper, MD     Progressively worsening renal  function, needs nephrology eval. Pt to d/c metformin and advised no anti inflammatories for pain      Other   HYPERLIPIDEMIA   Last Assessment & Plan   09/20/2013 Office Visit Written 09/21/2013  8:00 PM by Fayrene Helper, MD     Uncontrolled Hyperlipidemia:Low fat diet discussed and encouraged.  Updated lab with next draw    Obesity   Last Assessment & Plan   09/20/2013 Office Visit Written 09/21/2013  8:02 PM by Fayrene Helper, MD     Deteriorated. Patient re-educated about  the importance of commitment to a  minimum of 150 minutes of exercise per week. The importance of healthy food choices with portion control discussed. Encouraged to start a food diary, count calories and to consider  joining a support group. Sample diet sheets offered. Goals set by the patient for the next several months.       Insomnia   Last Assessment & Plan   09/20/2013 Office Visit Written  09/21/2013  8:01 PM by Fayrene Helper, MD     Increased anxiety due to spouse's ill health is the major contributor. Sleep hygiene discussed, pt to start restoril , low dose    Anemia   Last Assessment & Plan   04/02/2012 Office Visit Edited 04/04/2012 10:56 PM by Yehuda Savannah, MD     Patient continues to have a microcytic anemia with iron studies suggesting iron deficiency.  A single stool Hemoccult was negative in 2011.  Upper and lower endoscopy appears to be indicated-I will discuss with patient's PCP, Dr. Moshe Cipro.    Routine general medical examination at a health care facility   Last Assessment & Plan   07/26/2012 Office Visit Written 07/28/2012  8:48 PM by Fayrene Helper, MD     Pelvic and breast exam performed and documented. Pap sent, rectal normal , heme negative stool Importance of restricting sodium and water for heart failure management stessed. Following a low carb , low fat diet for good health discussed, as well as blood sugar control. Need to get flu vaccine    Left knee pain   Last Assessment & Plan   05/21/2013 Office Visit Written 05/26/2013  5:30 PM by Fayrene Helper, MD     Uncontrolled, tylenol administered in office for pain relief        Imaging: No results found.

## 2013-11-14 NOTE — Assessment & Plan Note (Signed)
Repeat echocardiogram is ordered on this visit. She does have a significant holosystolic murmur, 2/6. Will evaluate this further and make recommendations.

## 2013-11-14 NOTE — Progress Notes (Signed)
HPI: [name] [name] is a 68 year old patient Dr. Crissie Sickles we are following for ongoing assessment and management of ischemic heart myopathy, bundle branch block, with chronic class II congestive heart failure symptoms. He was last in the office in August of 2014. This recent echocardiogram in March of 2013 demonstrated an EF of 20 to 25% with mild to moderate aortic stenosis. She has declined implantation of ICD pacemaker. Echocardiogram will be ordered on this visit for reassessment of aortic valve.   Times a day without cardiac complaints of chest pain, heart racing, or dyspnea on exertion. She does states when she over exerts herself too much she will feel tired and will have to rest with some mild shortness of breath. Otherwise she is doing well from a cardiac standpoint. She goes into detail about having to take care of her husband who is very ill, and some grandchildren. She says that is what her main stress is at this time, and she basically is not focused on any symptoms she may be having. She is medically compliant.  Allergies  Allergen Reactions  . Penicillins     Family unsure of reaction, but certain mom said she was allergic    Current Outpatient Prescriptions  Medication Sig Dispense Refill  . aspirin EC 81 MG tablet Take 1 tablet (81 mg total) by mouth daily.  150 tablet  2  . atorvastatin (LIPITOR) 80 MG tablet TAKE ONE TABLET DAILY.  30 tablet  3  . carvedilol (COREG) 25 MG tablet Take 25 mg by mouth 2 (two) times daily.      . fenofibrate (TRICOR) 145 MG tablet Take 145 mg by mouth every morning.      . furosemide (LASIX) 40 MG tablet Take 40 mg by mouth every morning.      Marland Kitchen glucose blood (EASYMAX TEST) test strip Use as instructed for once daily testing dx 250.00  50 each  5  . insulin detemir (LEVEMIR) 100 UNIT/ML injection Inject 15 Units into the skin at bedtime.      Marland Kitchen linagliptin (TRADJENTA) 5 MG TABS tablet Take 5 mg by mouth daily.      Marland Kitchen lisinopril  (PRINIVIL,ZESTRIL) 2.5 MG tablet Take 2.5 mg by mouth every morning.      . Multiple Vitamin (MULITIVITAMIN WITH MINERALS) TABS Take 1 tablet by mouth every morning.      . niacin (NIASPAN) 1000 MG CR tablet TAKE (1) TABLET BY MOUTH AT BEDTIME.  30 tablet  1  . spironolactone (ALDACTONE) 25 MG tablet Take 1 tablet (25 mg total) by mouth every morning.  90 tablet  3  . [DISCONTINUED] cetirizine (ZYRTEC) 10 MG tablet Take 1 tablet (10 mg total) by mouth daily.  30 tablet  3  . [DISCONTINUED] ferrous sulfate 325 (65 FE) MG EC tablet Take 1 tablet (325 mg total) by mouth 2 (two) times daily.  60 tablet  5  . [DISCONTINUED] FLUoxetine (PROZAC) 10 MG tablet Take 1 tablet (10 mg total) by mouth daily. Take one capsule by mouth once a day  30 tablet  3  . [DISCONTINUED] fluticasone (FLONASE) 50 MCG/ACT nasal spray Place 2 sprays into the nose daily.  16 g  3   No current facility-administered medications for this visit.    Past Medical History  Diagnosis Date  . 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 Hyperlipidemia   . Anxiety   . Aortic stenosis 2009  Echocardiogram 2009-normal EF, mild AS; negative stress nuclear in 2004; negative carotid duplex study in 2009; echo 10/2011-mild LVH, AS-IS-apical akinesis with EF of 20-25%,  mild to mod. AS  . Pneumonia 10/2011    Hospitalized  . Palpitations     freq. PVCs  . Cardiomyopathy 10/2011    EF of 20-25% on echo in 10/2011  . Systolic dysfunction 123456    severe  . Heart murmur   . Shortness of breath   . Diabetes mellitus, type 2     insulin dependent  . Arthritis     in knee    Past Surgical History  Procedure Laterality Date  . Tubal ligation    . Cesarean section    . Cardiac catheterization  12/05/2011    ROS:  Review of systems complete and found to be negative unless listed above  PHYSICAL EXAM BP 115/52  Pulse 86  Ht 5\' 2"  (1.575 m)  Wt 177 lb (80.287 kg)  BMI 32.37 kg/m2 General:  Well developed, well nourished, in no acute distress Head: Eyes PERRLA, No xanthomas.   Normal cephalic and atramatic  Lungs: Clear bilaterally to auscultation and percussion. Heart: HRRR S1 S2,  2/6 systolic murmur.  Pulses are 2+ & equal.            No carotid bruit. No JVD.  No abdominal bruits. No femoral bruits. Abdomen: Bowel sounds are positive, abdomen soft and non-tender without masses or                  Hernia's noted. Msk:  Back normal, normal gait. Normal strength and tone for age. Extremities: No clubbing, cyanosis or edema.  DP +1 Neuro: Alert and oriented X 3. Psych:  Good affect, responds appropriately   EKG:  NSR with right atrial enlargement.  ASSESSMENT AND PLAN

## 2013-11-15 ENCOUNTER — Ambulatory Visit: Payer: Medicare Other | Admitting: Adult Health

## 2013-11-19 ENCOUNTER — Telehealth: Payer: Self-pay | Admitting: Family Medicine

## 2013-11-20 ENCOUNTER — Telehealth: Payer: Self-pay | Admitting: Internal Medicine

## 2013-11-20 ENCOUNTER — Other Ambulatory Visit (HOSPITAL_COMMUNITY): Payer: Medicare Other

## 2013-11-20 ENCOUNTER — Ambulatory Visit (HOSPITAL_COMMUNITY)
Admission: RE | Admit: 2013-11-20 | Discharge: 2013-11-20 | Disposition: A | Discharge status: Home / Self Care | Payer: Medicare Other | Source: Ambulatory Visit | Attending: Adult Health | Admitting: Adult Health

## 2013-11-20 DIAGNOSIS — I359 Nonrheumatic aortic valve disorder, unspecified: Secondary | ICD-10-CM | POA: Insufficient documentation

## 2013-11-20 DIAGNOSIS — I5030 Unspecified diastolic (congestive) heart failure: Secondary | ICD-10-CM | POA: Insufficient documentation

## 2013-11-20 DIAGNOSIS — Z6832 Body mass index (BMI) 32.0-32.9, adult: Secondary | ICD-10-CM | POA: Insufficient documentation

## 2013-11-20 DIAGNOSIS — I259 Chronic ischemic heart disease, unspecified: Secondary | ICD-10-CM | POA: Insufficient documentation

## 2013-11-20 DIAGNOSIS — I509 Heart failure, unspecified: Secondary | ICD-10-CM | POA: Insufficient documentation

## 2013-11-20 DIAGNOSIS — I10 Essential (primary) hypertension: Secondary | ICD-10-CM | POA: Insufficient documentation

## 2013-11-20 DIAGNOSIS — E785 Hyperlipidemia, unspecified: Secondary | ICD-10-CM | POA: Insufficient documentation

## 2013-11-20 DIAGNOSIS — I428 Other cardiomyopathies: Secondary | ICD-10-CM | POA: Insufficient documentation

## 2013-11-20 DIAGNOSIS — I255 Ischemic cardiomyopathy: Secondary | ICD-10-CM

## 2013-11-20 MED ORDER — CARVEDILOL 25 MG PO TABS: 25.0000 mg | ORAL_TABLET | Freq: Two times a day (BID) | ORAL | Status: DC

## 2013-11-20 NOTE — Telephone Encounter (Signed)
Medication sent via escribe.  

## 2013-11-20 NOTE — Telephone Encounter (Signed)
This is a situation where help t from the health dept should be available as far as meds are concerned.If stable on her current med regime I will be willing to mange the blood sugar. FIRST however , please ensure she gets with [facility], pls help her as able and s send for recent labs from endo and ensure she has visit here in the next 4 to 6 weeks

## 2013-11-20 NOTE — Telephone Encounter (Signed)
States she already owes a bill at [employer] office and she can't keep going to him. He gave her Levemir 18 units daily but he had been giving her samples but she went to get the script and can't pay $90 for it. She said he is trying to take all her money and they don't have much. Please advise

## 2013-11-20 NOTE — Telephone Encounter (Signed)
Received fax refill request  Rx # [id number] Medication:  Carvedilol 25 mg tablet Qty 60 Sig:  Take one tablet by mouth twice daily with meals Physician:  Lovena Le

## 2013-11-20 NOTE — Progress Notes (Signed)
*  PRELIMINARY RESULTS* Echocardiogram 2D Echocardiogram has been performed.  Su Grand Kirby Cortese 11/20/2013, 12:20 PM

## 2013-11-21 ENCOUNTER — Other Ambulatory Visit: Payer: Self-pay | Admitting: Family Medicine

## 2013-11-21 ENCOUNTER — Other Ambulatory Visit: Payer: Self-pay

## 2013-11-21 MED ORDER — INSULIN DETEMIR 100 UNIT/ML ~~LOC~~ SOLN: 18.0000 [IU] | Freq: Every day | SUBCUTANEOUS | Status: DC

## 2013-11-22 ENCOUNTER — Telehealth: Payer: Self-pay | Admitting: Cardiovascular Disease

## 2013-11-22 MED ORDER — FUROSEMIDE 40 MG PO TABS: 40.0000 mg | ORAL_TABLET | Freq: Every morning | ORAL | Status: DC

## 2013-11-22 NOTE — Telephone Encounter (Signed)
Received fax refill request  Rx # [id number] Medication:  Furosemide 40 mg tablet Qty 30 Sig:  Take one tablet daily Physician:  Bronson Ing

## 2013-11-22 NOTE — Telephone Encounter (Signed)
Medication sent via escribe.  

## 2013-11-28 NOTE — Telephone Encounter (Signed)
appt made for 6 weeks and patient referred to Deny[facility] Amass and rx faxed

## 2013-12-03 ENCOUNTER — Encounter: Payer: Self-pay | Admitting: Cardiology

## 2013-12-03 ENCOUNTER — Ambulatory Visit (INDEPENDENT_AMBULATORY_CARE_PROVIDER_SITE_OTHER): Payer: Medicare Other | Admitting: Cardiology

## 2013-12-03 ENCOUNTER — Encounter: Payer: Self-pay | Admitting: *Deleted

## 2013-12-03 VITALS — BP 99/51 | HR 75 | Ht 67.0 in | Wt 178.0 lb

## 2013-12-03 DIAGNOSIS — I509 Heart failure, unspecified: Secondary | ICD-10-CM

## 2013-12-03 DIAGNOSIS — N183 Chronic kidney disease, stage 3 unspecified: Secondary | ICD-10-CM

## 2013-12-03 DIAGNOSIS — I359 Nonrheumatic aortic valve disorder, unspecified: Secondary | ICD-10-CM

## 2013-12-03 DIAGNOSIS — I130 Hypertensive heart and chronic kidney disease with heart failure and stage 1 through stage 4 chronic kidney disease, or unspecified chronic kidney disease: Secondary | ICD-10-CM

## 2013-12-03 DIAGNOSIS — I428 Other cardiomyopathies: Secondary | ICD-10-CM

## 2013-12-03 DIAGNOSIS — I350 Nonrheumatic aortic (valve) stenosis: Secondary | ICD-10-CM

## 2013-12-03 DIAGNOSIS — N039 Chronic nephritic syndrome with unspecified morphologic changes: Secondary | ICD-10-CM

## 2013-12-03 DIAGNOSIS — I447 Left bundle-branch block, unspecified: Secondary | ICD-10-CM | POA: Insufficient documentation

## 2013-12-03 NOTE — Progress Notes (Signed)
Clinical Summary [name] [name] is a 68 y.o.female scheduled to come back into the office by [name] to review a recent echocardiogram. This is my first meeting with her in the office. Record review finds that she has been followed by Dr. Lovena [name] and previously Dr. Lattie [name] over the years. She saw [name] in early April and a followup echocardiogram was obtained.  Recent echocardiogram read by Dr. Harl [name] indicates mildly dilated left ventricle with normal wall thickness and LVEF 15% associated with diffuse hypokinesis and grade 1 diastolic dysfunction. Aortic valve is described as being moderately to severely calcified with evidence of severe stenosis by continuity equation - moderately elevated gradient suggestive of low flow AS, also mild to moderate aortic regurgitation. There was mild mitral regurgitation, moderate left atrial and right mild right atrial enlargement. PASP 35 mm mercury. Recent ECG showed sinus rhythm with left bundle branch block.   I reviewed her echocardiogram, LVEF is around 20% with paradoxical septal motion consistent with left bundle branch block. Aortic valve is difficult to characterize but suspect it is bicuspid and severely calcified. Some views suggest better leaflet opening than others, and there is mild aortic regurgitation. Mean gradient is around 30 mm of mercury, and valve area calculations are under 1 cm based on continuity equation with LVOT to AV ratio under 25%. It certainly could be consistent with low gradient aortic stenosis, but cannot entirely exclude pseudo-stenosis with reduced cardiac output (3.6 L/min).  Cardiac catheterization from April 2013 showed no angiographic evidence of CAD with normal filling pressures.  She comes in today telling me that she has had recent seasonal allergy symptoms. She states that she has been able to do her house chores and other ADLs without major limitation. She has an ailing husband that she cares for, also  grandchildren that she is raising. She does not report any anginal chest pain. Indicates NYHA class II dyspnea most of the time. She has had no palpitations or syncope. Sometimes feels somewhat dizzy.   Allergies  Allergen Reactions  . Penicillins     Family unsure of reaction, but certain mom said she was allergic    Current Outpatient Prescriptions  Medication Sig Dispense Refill  . aspirin EC 81 MG tablet Take 1 tablet (81 mg total) by mouth daily.  150 tablet  2  . atorvastatin (LIPITOR) 80 MG tablet TAKE ONE TABLET DAILY.  30 tablet  3  . carvedilol (COREG) 25 MG tablet Take 1 tablet (25 mg total) by mouth 2 (two) times daily.  60 tablet  6  . fenofibrate (TRICOR) 145 MG tablet Take 145 mg by mouth every morning.      . furosemide (LASIX) 40 MG tablet Take 1 tablet (40 mg total) by mouth every morning.  30 tablet  6  . glucose blood (EASYMAX TEST) test strip Use as instructed for once daily testing dx 250.00  50 each  5  . insulin detemir (LEVEMIR) 100 UNIT/ML injection Inject 0.18 mLs (18 Units total) into the skin at bedtime.  10 mL  3  . linagliptin (TRADJENTA) 5 MG TABS tablet Take 5 mg by mouth daily.      Marland Kitchen lisinopril (PRINIVIL,ZESTRIL) 2.5 MG tablet TAKE ONE TABLET DAILY.  30 tablet  2  . Multiple Vitamin (MULITIVITAMIN WITH MINERALS) TABS Take 1 tablet by mouth every morning.      . niacin (NIASPAN) 1000 MG CR tablet TAKE (1) TABLET BY MOUTH AT BEDTIME.  30 tablet  1  . spironolactone (ALDACTONE) 25 MG tablet Take 1 tablet (25 mg total) by mouth every morning.  90 tablet  3  . [DISCONTINUED] cetirizine (ZYRTEC) 10 MG tablet Take 1 tablet (10 mg total) by mouth daily.  30 tablet  3  . [DISCONTINUED] ferrous sulfate 325 (65 FE) MG EC tablet Take 1 tablet (325 mg total) by mouth 2 (two) times daily.  60 tablet  5  . [DISCONTINUED] FLUoxetine (PROZAC) 10 MG tablet Take 1 tablet (10 mg total) by mouth daily. Take one capsule by mouth once a day  30 tablet  3  . [DISCONTINUED]  fluticasone (FLONASE) 50 MCG/ACT nasal spray Place 2 sprays into the nose daily.  16 g  3   No current facility-administered medications for this visit.    Past Medical History  Diagnosis Date  . Essential hypertension, benign   . Hyperlipidemia   . Anxiety   . Aortic stenosis   . History of pneumonia 10/2011  . Symptomatic PVCs   . Cardiomyopathy     EF of 20-25% on echo in 10/2011, has declined ICD  . Diabetes mellitus, type 2   . Arthritis     Past Surgical History  Procedure Laterality Date  . Tubal ligation    . Cesarean section      Family History  Problem Relation Age of Onset  . Heart disease Mother   . Heart attack Mother   . Diabetes Mother   . Hypertension Mother   . Colon cancer Father   . Breast cancer Sister   . Diabetes Brother   . Hypertension Brother   . Stroke Brother     Social History [name] reports that she has never smoked. She has never used smokeless tobacco. [name] reports that she does not drink alcohol.  Review of Systems Negative except as outlined above.  Physical Examination Filed Vitals:   12/03/13 1334  BP: 99/51  Pulse: 75   Filed Weights   12/03/13 1334  Weight: 178 lb (80.74 kg)   Overweight woman, in no distress HEENT: Conjunctiva and lids normal, oropharynx clear. Neck: Supple, no carotid bruits, no thyromegaly. Lungs: Clear to auscultation, nonlabored breathing at rest. Cardiac: Regular rate and rhythm, indistinct PMI, no S3, 3/6 systolic murmur, no pericardial rub. Abdomen: Soft, nontender, bowel sounds present, no guarding or rebound. Extremities: Trace edema, distal pulses 2+. Skin: Warm and dry. Musculoskeletal: No kyphosis. Neuropsychiatric: Alert and oriented x3, affect grossly appropriate.    Problem List and Plan   Aortic stenosis Reviewed recent echocardiogram. It is possible that she has low gradient severe aortic stenosis in the setting of cardiomyopathy, however pseudo-stenosis with  decreased AV opening due to low output with cardiomyopathy is also to be considered. Suspect that her aortic valve is bicuspid, although difficult to visualize. She is on medical therapy as outlined above. We have discussed the matter, and recommendation is to proceed with a dobutamine echocardiogram to better assess for potential low gradient aortic stenosis, and then decide how to proceed in terms of therapeutic options. I did discuss the case briefly with Dr. Burt Knack to get his input. She will need to hold Coreg for dobutamine echocardiogram, we will reduce her p.m. dose to 12.5 mg the day before the test, and then hold her morning dose on the day of the test. She will take her bottle with her so that she can take a dose after the study has been completed. Followup arranged.  Nonischemic cardiomyopathy LVEF approximately  20% as outlined above. Further workup of aortic stenosis is being arranged. Plan to hold lisinopril for now in light of low normal blood pressure and some intermittent dizziness.  LBBB (left bundle branch block) Long-standing. Patient has had previous EP evaluation by Dr. Lovena [name] and declined ICD therapies.  HYPERLIPIDEMIA On Lipitor, followed by Dr. Moshe Cipro.  CKD (chronic kidney disease) stage 3, GFR 30-59 ml/min Creatinine 1.6 as of October 2014.    [name], M.D., F.A.C.C.

## 2013-12-03 NOTE — Assessment & Plan Note (Signed)
LVEF approximately 20% as outlined above. Further workup of aortic stenosis is being arranged. Plan to hold lisinopril for now in light of low normal blood pressure and some intermittent dizziness.

## 2013-12-03 NOTE — Patient Instructions (Addendum)
Your physician recommends that you schedule a follow-up appointment in: in the next few weeks   Your physician has recommended you make the following change in your medication:   HOLD Lisinopril for now   Your physician has requested that you have a dobutamine echocardiogram to be done in the [facility]. For further information please visit [email or web address]. Please follow instruction sheet as given.  PLEASE take 1/2 dose of your Coreg (12.5 mg) the evening before your test and HOLD the dose the morning of the test   Please bring your medications with you the day of your test

## 2013-12-03 NOTE — Assessment & Plan Note (Signed)
On Lipitor, followed by Dr. Moshe Cipro.

## 2013-12-03 NOTE — Assessment & Plan Note (Signed)
Long-standing. Patient has had previous EP evaluation by [name] and declined ICD therapies.

## 2013-12-03 NOTE — Assessment & Plan Note (Signed)
Reviewed recent echocardiogram. It is possible that she has low gradient severe aortic stenosis in the setting of cardiomyopathy, however pseudo-stenosis with decreased AV opening due to low output with cardiomyopathy is also to be considered. Suspect that her aortic valve is bicuspid, although difficult to visualize. She is on medical therapy as outlined above. We have discussed the matter, and recommendation is to proceed with a dobutamine echocardiogram to better assess for potential low gradient aortic stenosis, and then decide how to proceed in terms of therapeutic options. I did discuss the case briefly with Dr. Burt Knack to get his input. She will need to hold Coreg for dobutamine echocardiogram, we will reduce her p.m. dose to 12.5 mg the day before the test, and then hold her morning dose on the day of the test. She will take her bottle with her so that she can take a dose after the study has been completed. Followup arranged.

## 2013-12-03 NOTE — Assessment & Plan Note (Signed)
Creatinine 1.6 as of October 2014.

## 2013-12-09 IMAGING — CR DG CHEST 2V
2 series · 2 of 2 positions shown · non-contrast
Comparison: None.

CLINICAL DATA: Shortness of breath.

CHEST - 2 VIEW

[view not recorded (1 of 2)]
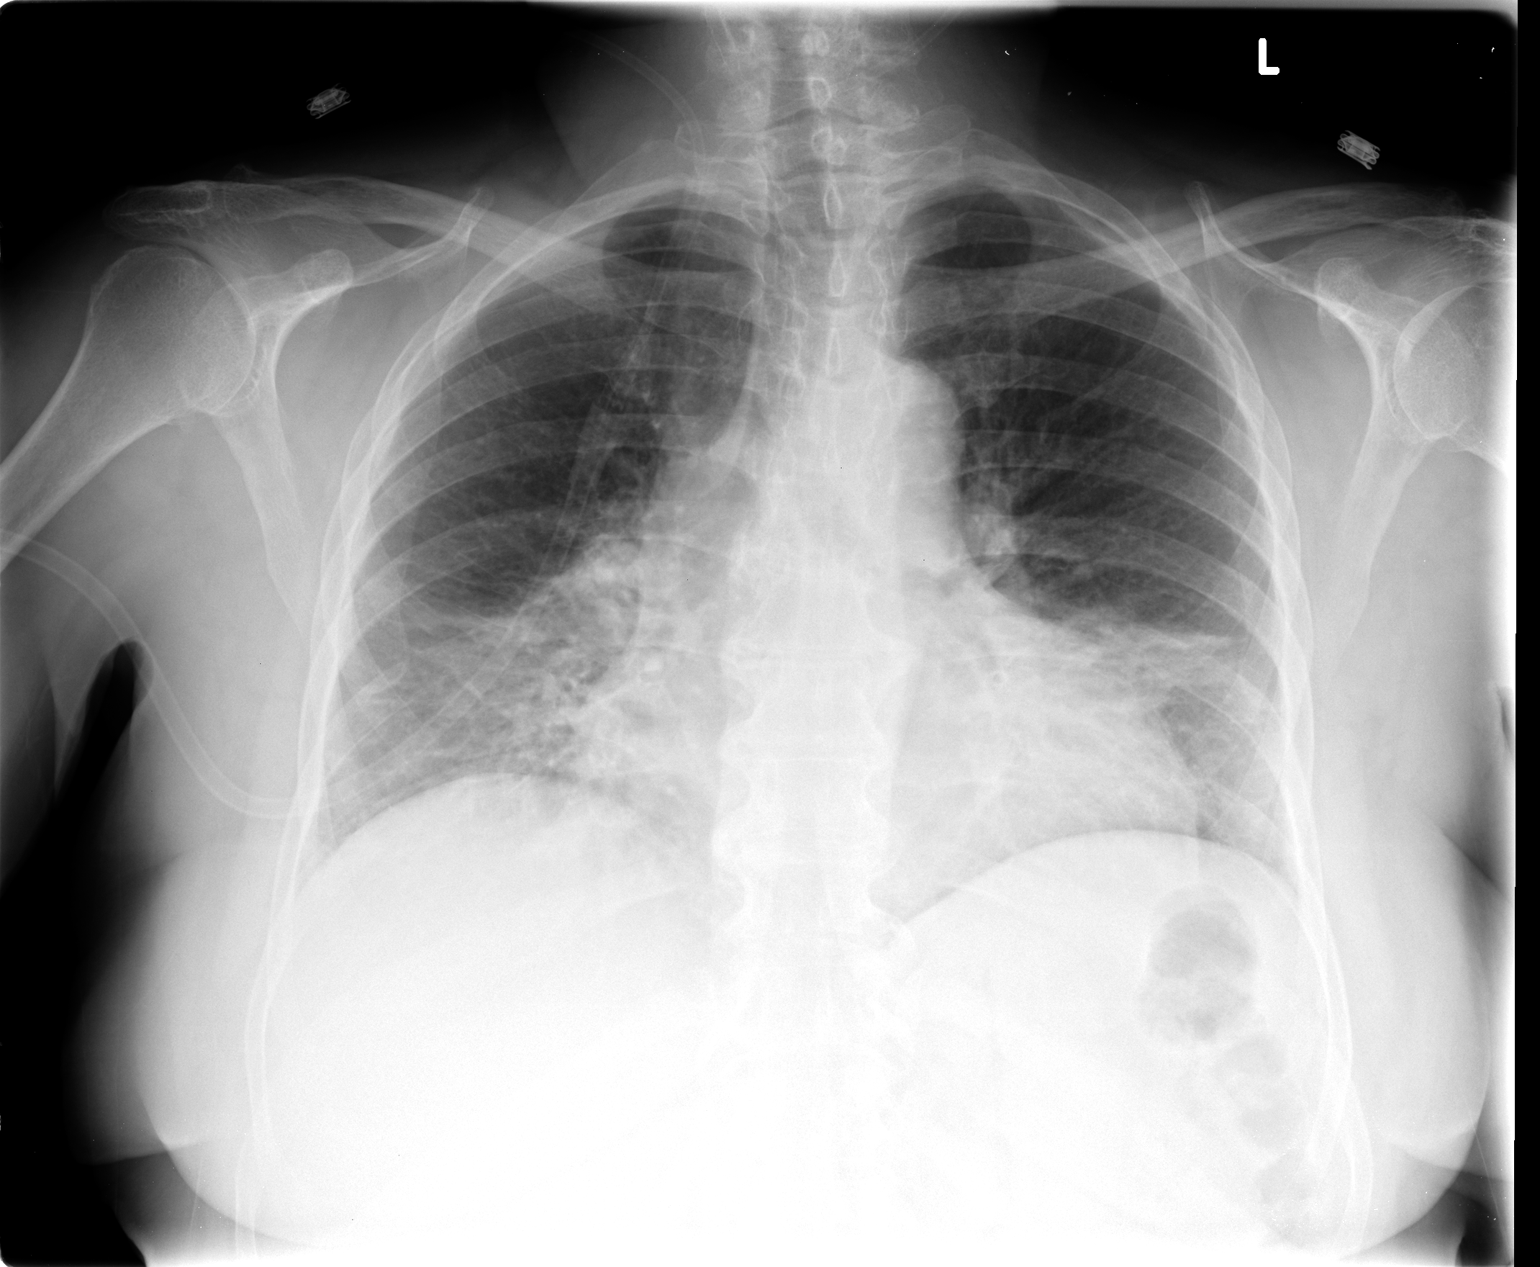

[view not recorded (2 of 2)]
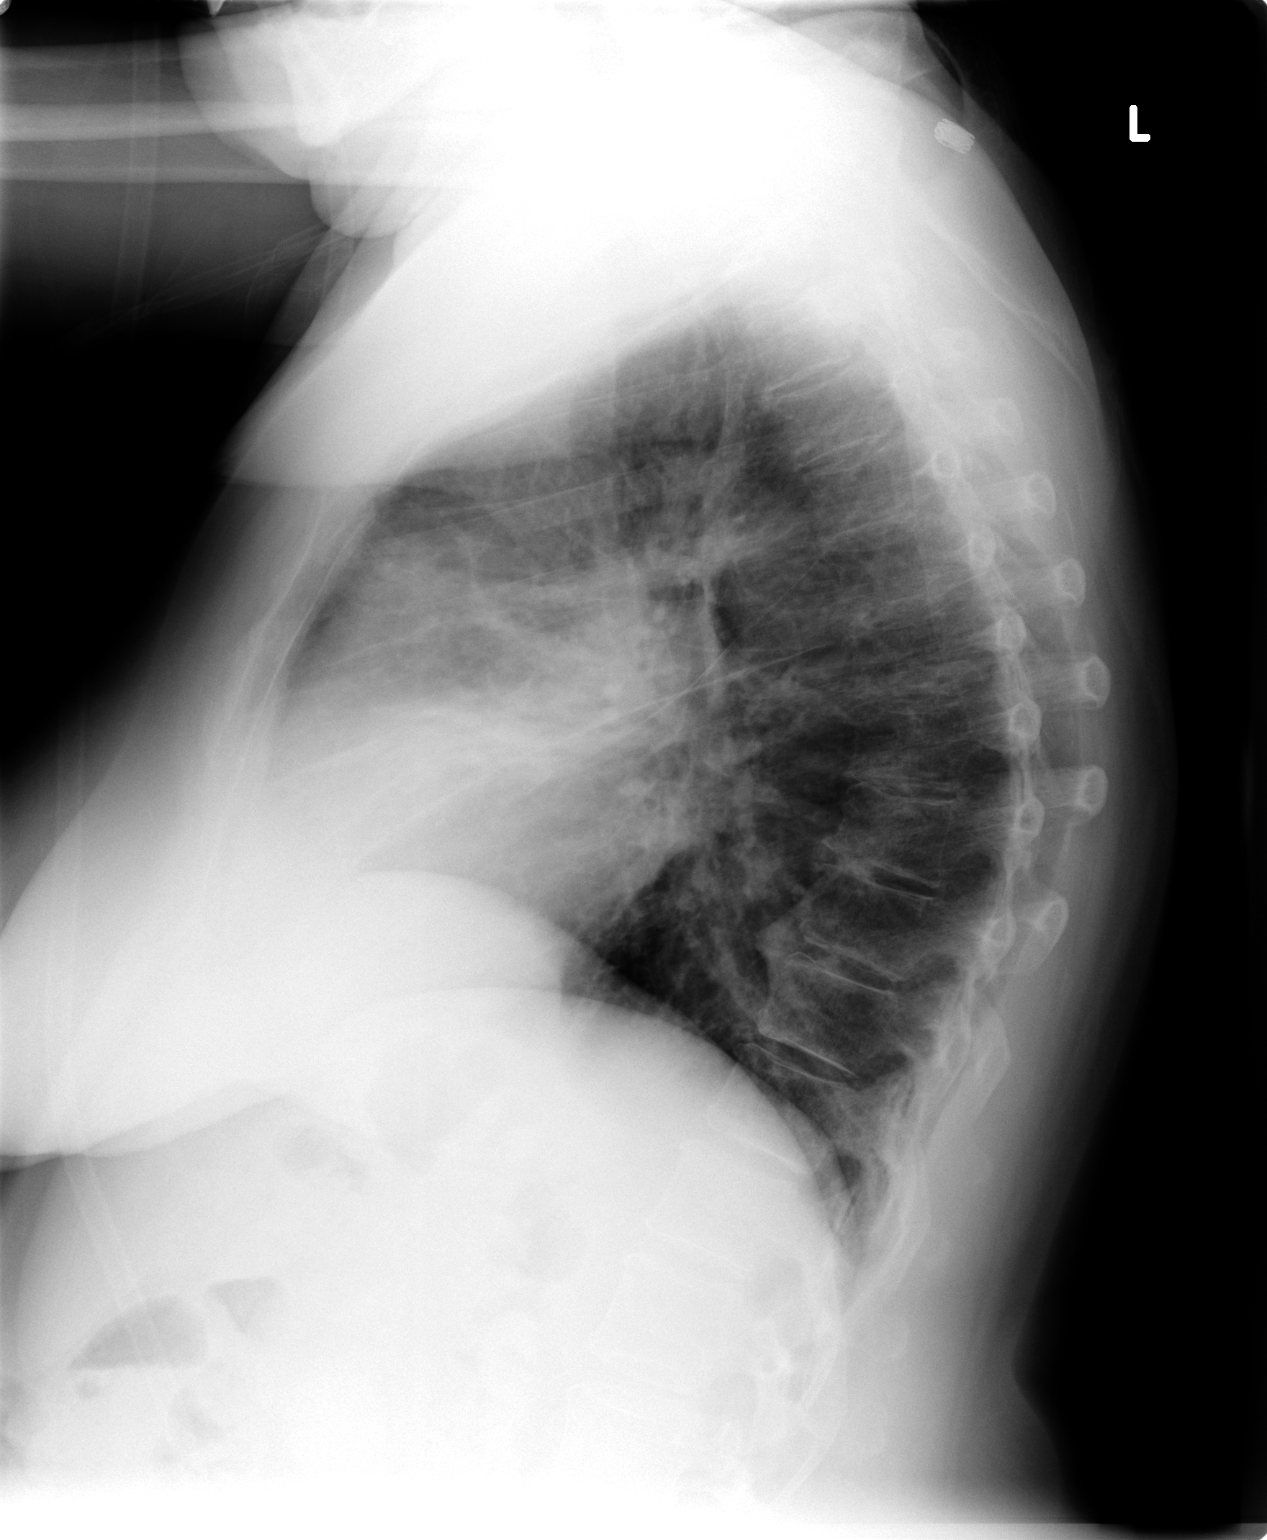

[2 of 2 positions shown; findings below may reference images not displayed]

FINDINGS: Bilateral lower lung infiltrates are noted.  These appear
to be within the right middle lobe and lingula.  Findings
concerning for multifocal pneumonia.  Heart is borderline in size.
No effusions or acute bony abnormality.
IMPRESSION: Right middle lobe and lingular opacities concerning for pneumonia.

## 2013-12-19 ENCOUNTER — Encounter (INDEPENDENT_AMBULATORY_CARE_PROVIDER_SITE_OTHER): Payer: Self-pay

## 2013-12-19 ENCOUNTER — Other Ambulatory Visit: Payer: Self-pay | Admitting: Family Medicine

## 2013-12-19 ENCOUNTER — Encounter: Payer: Self-pay | Admitting: Family Medicine

## 2013-12-19 ENCOUNTER — Ambulatory Visit (INDEPENDENT_AMBULATORY_CARE_PROVIDER_SITE_OTHER): Payer: Medicare Other | Admitting: Family Medicine

## 2013-12-19 VITALS — BP 108/74 | HR 85 | Resp 16 | Wt 178.1 lb

## 2013-12-19 DIAGNOSIS — E785 Hyperlipidemia, unspecified: Secondary | ICD-10-CM

## 2013-12-19 DIAGNOSIS — I10 Essential (primary) hypertension: Secondary | ICD-10-CM

## 2013-12-19 DIAGNOSIS — N183 Chronic kidney disease, stage 3 unspecified: Secondary | ICD-10-CM

## 2013-12-19 DIAGNOSIS — E1065 Type 1 diabetes mellitus with hyperglycemia: Secondary | ICD-10-CM

## 2013-12-19 DIAGNOSIS — IMO0001 Reserved for inherently not codable concepts without codable children: Secondary | ICD-10-CM

## 2013-12-19 DIAGNOSIS — E1165 Type 2 diabetes mellitus with hyperglycemia: Secondary | ICD-10-CM

## 2013-12-19 DIAGNOSIS — I428 Other cardiomyopathies: Secondary | ICD-10-CM

## 2013-12-19 DIAGNOSIS — IMO0002 Reserved for concepts with insufficient information to code with codable children: Secondary | ICD-10-CM

## 2013-12-19 DIAGNOSIS — Z794 Long term (current) use of insulin: Secondary | ICD-10-CM

## 2013-12-19 MED ORDER — NIACIN ER (ANTIHYPERLIPIDEMIC) 1000 MG PO TBCR: EXTENDED_RELEASE_TABLET | ORAL | Status: DC

## 2013-12-19 MED ORDER — LINAGLIPTIN 5 MG PO TABS: 5.0000 mg | ORAL_TABLET | Freq: Every day | ORAL | Status: DC

## 2013-12-19 MED ORDER — INSULIN DETEMIR 100 UNIT/ML ~~LOC~~ SOLN: 18.0000 [IU] | Freq: Every day | SUBCUTANEOUS | Status: DC

## 2013-12-19 NOTE — Patient Instructions (Addendum)
Annual wellness in September, call if you need me before  We will request tradjenta and levemir for you  Continue to hold lisinopril as directed due to low blood pressure  No other med changes

## 2013-12-22 NOTE — Assessment & Plan Note (Signed)
Denies symptoms consistent with increased heart failure, able to perform household chores with no noted increased exertional fatigue, denies PND , orthopnea or leg edema'

## 2013-12-22 NOTE — Assessment & Plan Note (Signed)
Improved blood sugar control needed, she will have her diabetes care through endo\She is also being seen by nephrology

## 2013-12-22 NOTE — Assessment & Plan Note (Signed)
Currently hypotensive, ACE being held, will resume based on cardiology recommendations, has eval of  heart function next week

## 2013-12-22 NOTE — Progress Notes (Signed)
   Subjective:    Patient ID: [name] [name], female    DOB: Sep 10, 1945, 68 y.o.   MRN: [id number]  HPI Pt in for evaluation of chronic health issues and review and update of labs and health maintainace issues. Has had cardiology eval last monht, and is scheduled for dobutamine stress test next week to further eval heart structure and function. She has no overt symptoms of decompensated heart failure. Blood sugar control continues to be a challenge espescialy due to lack of access to affordable medication. I will attempt to channel help for her through the health dept as she continues to see endo, due to persistent uncontrolled diabetes with CKD and heart failure Review of Systems See HPI Denies recent fever or chills. Denies sinus pressure, nasal congestion, ear pain or sore throat. Denies chest congestion, productive cough or wheezing. Denies chest pains, palpitations and leg swelling Denies abdominal pain, nausea, vomiting,diarrhea or constipation.   Denies dysuria, frequency, hesitancy or incontinence. Denies joint pain, swelling and limitation in mobility. Denies headaches, seizures, numbness, or tingling. Denies depression, anxiety or insomnia. Denies skin break down or rash.         Objective:   Physical Exam BP 108/74  Pulse 85  Resp 16  Wt 178 lb 1.9 oz (80.795 kg)  SpO2 100% Patient alert and oriented and in no cardiopulmonary distress.  HEENT: No facial asymmetry, EOMI, no sinus tenderness,  oropharynx pink and moist.  Neck supple no adenopathy.No bruit, no jVD  Chest: Clear to auscultation bilaterally.  CVS: S1, S2 systolic  murmur, no [id number]  ABD: Soft non tender. Bowel sounds normal.  Ext: No edema  MS: Adequate ROM spine, shoulders, hips and knees.  Skin: Intact, no ulcerations or rash noted.  Psych: Good eye contact, normal affect. Memory intact not anxious or depressed appearing.  CNS: CN 2-12 intact, power, tone and sensation normal  throughout.        Assessment & Plan:  Essential hypertension, benign Currently hypotensive, ACE being held, will resume based on cardiology recommendations, has eval of  heart function next week  Diabetes mellitus, insulin dependent (IDDM), uncontrolled Improved, being treated by endo, has difficulty with medication cost, will attempt to obtain through th health dept. She is to continue to follow with endo  CKD (chronic kidney disease) stage 3, GFR 30-59 ml/min Improved blood sugar control needed, she will have her diabetes care through endo\She is also being seen by nephrology  HYPERLIPIDEMIA Uncontrolled Hyperlipidemia:Low fat diet discussed and encouraged.  Updated lab needed at/ before next visit.   Nonischemic cardiomyopathy Denies symptoms consistent with increased heart failure, able to perform household chores with no noted increased exertional fatigue, denies PND , orthopnea or leg edema'

## 2013-12-22 NOTE — Assessment & Plan Note (Signed)
Improved, being treated by endo, has difficulty with medication cost, will attempt to obtain through th health dept. She is to continue to follow with endo

## 2013-12-22 NOTE — Assessment & Plan Note (Signed)
Uncontrolled. Hyperlipidemia:Low fat diet discussed and encouraged.  Updated lab needed at/ before next visit.  

## 2013-12-23 ENCOUNTER — Ambulatory Visit (HOSPITAL_COMMUNITY): Payer: Medicare Other | Attending: Cardiology

## 2013-12-23 ENCOUNTER — Other Ambulatory Visit (HOSPITAL_COMMUNITY): Payer: Medicare Other | Admitting: *Deleted

## 2013-12-23 DIAGNOSIS — I350 Nonrheumatic aortic (valve) stenosis: Secondary | ICD-10-CM

## 2013-12-23 DIAGNOSIS — I428 Other cardiomyopathies: Secondary | ICD-10-CM

## 2013-12-23 DIAGNOSIS — I359 Nonrheumatic aortic valve disorder, unspecified: Secondary | ICD-10-CM

## 2013-12-23 MED ORDER — DOBUTAMINE INFUSION FOR EP/ECHO/NUC (1000 MCG/ML)
0.0000 ug/kg/min | INTRAVENOUS | Status: DC
  Administered 2013-12-23: 20 ug/kg/min via INTRAVENOUS

## 2013-12-23 NOTE — Progress Notes (Signed)
Dobutamine stress echo performed (AS).

## 2013-12-27 ENCOUNTER — Ambulatory Visit (INDEPENDENT_AMBULATORY_CARE_PROVIDER_SITE_OTHER): Payer: Medicare Other | Admitting: Cardiology

## 2013-12-27 ENCOUNTER — Encounter: Payer: Self-pay | Admitting: Cardiology

## 2013-12-27 VITALS — BP 120/78 | HR 76 | Ht 62.0 in | Wt 176.0 lb

## 2013-12-27 DIAGNOSIS — N183 Chronic kidney disease, stage 3 unspecified: Secondary | ICD-10-CM

## 2013-12-27 DIAGNOSIS — I350 Nonrheumatic aortic (valve) stenosis: Secondary | ICD-10-CM

## 2013-12-27 DIAGNOSIS — I359 Nonrheumatic aortic valve disorder, unspecified: Secondary | ICD-10-CM

## 2013-12-27 DIAGNOSIS — I428 Other cardiomyopathies: Secondary | ICD-10-CM

## 2013-12-27 DIAGNOSIS — I447 Left bundle-branch block, unspecified: Secondary | ICD-10-CM

## 2013-12-27 NOTE — Assessment & Plan Note (Signed)
LVEF approximately 20%. Cardiac catheterization in April 2013 showed no angiographic evidence of obstructive CAD.

## 2013-12-27 NOTE — Assessment & Plan Note (Signed)
Severe, relative low gradient aortic stenosis in the setting of cardiomyopathy. Recent testing reviewed above. Plan is to have the patient seen in consultation by Dr. Burt Knack to discuss possibility of TAVR.

## 2013-12-27 NOTE — Patient Instructions (Signed)
Your physician recommends that you schedule a follow-up appointment in: 3 months   Your physician recommends that you continue on your current medications as directed. Please refer to the Current Medication list given to you today.    We will call Dr.Cooper's office and le them know that they may schedule your consultation appointment with him    Thank you for choosing [facility] !

## 2013-12-27 NOTE — Assessment & Plan Note (Signed)
Creatinine 1.6 as of October 2014.

## 2013-12-27 NOTE — Assessment & Plan Note (Signed)
Long-standing. Patient has had previous EP evaluation by [name] and declined ICD therapies.

## 2013-12-27 NOTE — Progress Notes (Signed)
Clinical Summary [name] [name] is a 68 y.o.female seen by me for the first time recently in late April. History is detailed in the prior note including review of her most recent echocardiograms demonstrating suspected low gradient severe aortic stenosis with associated cardiomyopathy and LVEF of approximately 20%. I discussed her case with Dr. Burt Knack and referred her for a dobutamine echocardiogram.  Dobutamine echocardiogram demonstrated baseline peak AV velocity 3.7 m/s with mean gradient 31 mm mercury, and at 20 mcg/kilogram/minute dobutamine infusion, peak AV velocity increased to 4.2 m/s with mean gradient 38 mm mercury, consistent with severe stenosis. Study did not comment on whether there was any change in LV function.  Cardiac catheterization from April 2013 showed no angiographic evidence of CAD with normal filling pressures.  She is here with her son today. We discussed the results of her recent testing, and plan to have her evaluated by Dr. Burt Knack to discuss possibility of TAVR. She indicated that she wanted to proceed with this consultation to at least understand her options.   Allergies  Allergen Reactions  . Penicillins     Family unsure of reaction, but certain mom said she was allergic    Current Outpatient Prescriptions  Medication Sig Dispense Refill  . aspirin EC 81 MG tablet Take 1 tablet (81 mg total) by mouth daily.  150 tablet  2  . atorvastatin (LIPITOR) 80 MG tablet TAKE ONE TABLET DAILY.  30 tablet  3  . carvedilol (COREG) 25 MG tablet Take 1 tablet (25 mg total) by mouth 2 (two) times daily.  60 tablet  6  . fenofibrate (TRICOR) 145 MG tablet TAKE ONE TABLET BY MOUTH ONCE DAILY.  30 tablet  2  . furosemide (LASIX) 40 MG tablet Take 1 tablet (40 mg total) by mouth every morning.  30 tablet  6  . glucose blood (EASYMAX TEST) test strip Use as instructed for once daily testing dx 250.00  50 each  5  . insulin NPH Human (HUMULIN N) 100 UNIT/ML injection Inject 10  Units into the skin 2 (two) times daily before a meal.      . Multiple Vitamin (MULITIVITAMIN WITH MINERALS) TABS Take 1 tablet by mouth every morning.      . niacin (NIASPAN) 1000 MG CR tablet TAKE (1) TABLET BY MOUTH AT BEDTIME.  30 tablet  4  . spironolactone (ALDACTONE) 25 MG tablet Take 1 tablet (25 mg total) by mouth every morning.  90 tablet  3  . lisinopril (PRINIVIL,ZESTRIL) 2.5 MG tablet TAKE ONE TABLET DAILY.  30 tablet  2  . [DISCONTINUED] cetirizine (ZYRTEC) 10 MG tablet Take 1 tablet (10 mg total) by mouth daily.  30 tablet  3  . [DISCONTINUED] ferrous sulfate 325 (65 FE) MG EC tablet Take 1 tablet (325 mg total) by mouth 2 (two) times daily.  60 tablet  5  . [DISCONTINUED] FLUoxetine (PROZAC) 10 MG tablet Take 1 tablet (10 mg total) by mouth daily. Take one capsule by mouth once a day  30 tablet  3  . [DISCONTINUED] fluticasone (FLONASE) 50 MCG/ACT nasal spray Place 2 sprays into the nose daily.  16 g  3   No current facility-administered medications for this visit.    Past Medical History  Diagnosis Date  . Essential hypertension, benign   . Hyperlipidemia   . Anxiety   . Aortic stenosis   . History of pneumonia 10/2011  . Symptomatic PVCs   . Cardiomyopathy     EF of  20-25% on echo in 10/2011, has declined ICD  . Diabetes mellitus, type 2   . Arthritis     Social History Ms. Keer reports that she has never smoked. She has never used smokeless tobacco. Ms. Genin reports that she does not drink alcohol.  Review of Systems No exertional chest pain. No palpitations or syncope. Chronic shortness of breath.  Physical Examination Filed Vitals:   12/27/13 1113  BP: 120/78  Pulse: 76   Filed Weights   12/27/13 1113  Weight: 176 lb (79.833 kg)    Overweight woman, in no distress  HEENT: Conjunctiva and lids normal, oropharynx clear.  Neck: Supple, no carotid bruits, no thyromegaly.  Lungs: Clear to auscultation, nonlabored breathing at rest.  Cardiac:  Regular rate and rhythm, indistinct PMI, no S3, 3/6 systolic murmur, no pericardial rub.  Abdomen: Soft, nontender, bowel sounds present, no guarding or rebound.  Extremities: Trace edema, distal pulses 2+.  Skin: Warm and dry.  Musculoskeletal: No kyphosis.  Neuropsychiatric: Alert and oriented x3, affect grossly appropriate.   Problem List and Plan   Aortic stenosis Severe, relative low gradient aortic stenosis in the setting of cardiomyopathy. Recent testing reviewed above. Plan is to have the patient seen in consultation by Dr. Burt Knack to discuss possibility of TAVR.  Nonischemic cardiomyopathy LVEF approximately 20%. Cardiac catheterization in April 2013 showed no angiographic evidence of obstructive CAD.  CKD (chronic kidney disease) stage 3, GFR 30-59 ml/min Creatinine 1.6 as of October 2014.  LBBB (left bundle branch block) Long-standing. Patient has had previous EP evaluation by [name] and declined ICD therapies.    [name], M.D., F.A.C.C.

## 2014-01-14 ENCOUNTER — Ambulatory Visit (INDEPENDENT_AMBULATORY_CARE_PROVIDER_SITE_OTHER): Payer: Medicare Other | Admitting: Cardiovascular Disease

## 2014-01-14 ENCOUNTER — Encounter: Payer: Self-pay | Admitting: Cardiovascular Disease

## 2014-01-14 ENCOUNTER — Encounter: Payer: Medicare Other | Admitting: Family Medicine

## 2014-01-14 VITALS — BP 130/84 | HR 78 | Ht 66.0 in | Wt 178.0 lb

## 2014-01-14 DIAGNOSIS — I350 Nonrheumatic aortic (valve) stenosis: Secondary | ICD-10-CM

## 2014-01-14 DIAGNOSIS — I359 Nonrheumatic aortic valve disorder, unspecified: Secondary | ICD-10-CM

## 2014-01-14 NOTE — Progress Notes (Signed)
HPI:  68 year old woman presenting for evaluation of aortic stenosis. The patient was referred by Dr. [name] for consideration of TAVR and discussion of diagnostic/treatment options related to her aortic stenosis.   The patient lives independently with her husband. She is modestly active with only mild shortness of breath. She does not feel particularly limited by her breathing. She denies chest pain, palpitations, edema, orthopnea, PND, lightheadedness, or syncope. She does 'tire easily' and admits to shortness of breath when she 'overdoes it.'  She was followed for mild aortic stenosis for many years by Dr Lattie Haw. Other long-term medical problems include diabetes, HTN, and hyperlipidemia. She had normal LV function until 2013 when she was diagnosed with severe cardiomyopathy LVEF less than 30%. At that time her aortic stenosis was in the mild range. She was treated medically but later that year developed acute respiratory failure primarily related to systolic heart failure. She underwent cardiac cath demonstrating normal intracardiac filling pressures, a mild gradient across the Ao valve, and normal coronaries.   Over the past 2 years, she has remained clinically stable but her aortic stenosis has progressed. Her most recent echo showed findings suspicious for low-gradient severe AS and this was confirmed by a dobutamine study. She presents today for further discussion of treatment options.   Outpatient Encounter Prescriptions as of 01/14/2014  Medication Sig  . aspirin EC 81 MG tablet Take 1 tablet (81 mg total) by mouth daily.  Marland Kitchen atorvastatin (LIPITOR) 80 MG tablet TAKE ONE TABLET DAILY.  . carvedilol (COREG) 25 MG tablet Take 1 tablet (25 mg total) by mouth 2 (two) times daily.  . fenofibrate (TRICOR) 145 MG tablet TAKE ONE TABLET BY MOUTH ONCE DAILY.  . furosemide (LASIX) 40 MG tablet Take 1 tablet (40 mg total) by mouth every morning.  Marland Kitchen glucose blood (EASYMAX TEST) test strip Use as  instructed for once daily testing dx 250.00  . insulin NPH Human (HUMULIN N) 100 UNIT/ML injection Inject 10 Units into the skin 2 (two) times daily before a meal.  . Multiple Vitamin (MULITIVITAMIN WITH MINERALS) TABS Take 1 tablet by mouth every morning.  . niacin (NIASPAN) 1000 MG CR tablet TAKE (1) TABLET BY MOUTH AT BEDTIME.  Marland Kitchen spironolactone (ALDACTONE) 25 MG tablet Take 1 tablet (25 mg total) by mouth every morning.  . [DISCONTINUED] GLIPIZIDE XL 10 MG 24 hr tablet   . [DISCONTINUED] lisinopril (PRINIVIL,ZESTRIL) 2.5 MG tablet TAKE ONE TABLET DAILY.    Penicillins  Past Medical History  Diagnosis Date  . Essential hypertension, benign   . Hyperlipidemia   . Anxiety   . Aortic stenosis   . History of pneumonia 10/2011  . Symptomatic PVCs   . Cardiomyopathy     EF of 20-25% on echo in 10/2011, has declined ICD  . Diabetes mellitus, type 2   . Arthritis     Past Surgical History  Procedure Laterality Date  . Tubal ligation    . Cesarean section      History   Social History  . Marital Status: Married    Spouse Name: N/A    Number of Children: 67  . Years of Education: N/A   Occupational History  . Employed    Social History Main Topics  . Smoking status: Never Smoker   . Smokeless tobacco: Never Used  . Alcohol Use: No  . Drug Use: No  . Sexual Activity: No   Other Topics Concern  . Not on file   Social History Narrative  .  No narrative on file    Family History  Problem Relation Age of Onset  . Heart disease Mother   . Heart attack Mother   . Diabetes Mother   . Hypertension Mother   . Colon cancer Father   . Breast cancer Sister   . Diabetes Brother   . Hypertension Brother   . Stroke Brother     ROS:  General: no fevers/chills/night sweats Eyes: no blurry vision, diplopia, or amaurosis ENT: no sore throat or hearing loss Resp: no cough, wheezing, or hemoptysis CV: see HPI GI: no abdominal pain, nausea, vomiting, diarrhea, or  constipation GU: no dysuria, frequency, or hematuria Skin: no rash Neuro: no headache, numbness, tingling, or weakness of extremities Musculoskeletal: positive for bilateral knee pain Heme: no bleeding, DVT, or easy bruising Endo: no polydipsia or polyuria, positive for diabetes and neuropathy  BP 130/84  Pulse 78  Ht 5\' 6"  (1.676 m)  Wt 80.74 kg (178 lb)  BMI 28.74 kg/m2  PHYSICAL EXAM: Pt is alert and oriented, WD, WN, in no distress. HEENT: normal Neck: JVP normal. Carotid upstrokes normal with bilateral bruits. No thyromegaly. Lungs: equal expansion, clear bilaterally CV: Apex is discrete and nondisplaced, RRR with grade 3/6 harsh systolic murmur at the LSB and RUSB Abd: soft, NT, +BS, no bruit, no hepatosplenomegaly Back: no CVA tenderness Ext: no C/C/E        DP/PT pulses intact and = Skin: warm and dry without rash Neuro: CNII-XII intact             Strength intact = bilaterally  2D Echo 11/20/2013: Left ventricle: The cavity size was mildly dilated. Wall thickness was normal. The estimated ejection fraction was 15%. Diffuse hypokinesis. The anteroseptal wall is thinned and akinetic. Doppler parameters are consistent with abnormal left ventricular relaxation (grade 1 diastolic dysfunction).  ------------------------------------------------------------ Aortic valve: Moderately to severely calcified annulus. Severely thickened leaflets. Uncertain number of leaflets. Doppler: There was severe stenosis by continuity equation with moderately elevated gradient consistent with severe low flow low gradient aortic stenosis in the setting of severe LV systolic dysfunction. Mild to moderate regurgitation. VTI ratio of LVOT to aortic valve: 0.21. Valve area: 0.67cm^2(VTI). Indexed valve area: 0.37cm^2/m^2 (VTI). Peak velocity ratio of LVOT to aortic valve: 0.2. Valve area: 0.62cm^2 (Vmax). Indexed valve area: 0.34cm^2/m^2 (Vmax). Mean gradient: 32mm Hg (S). Peak gradient: 12mm  Hg (S).  ------------------------------------------------------------ Aorta: Aortic root: The aortic root was normal in size.  ------------------------------------------------------------ Mitral valve: Mildly to moderately calcified annulus. Mildly thickened leaflets . Doppler: There was no evidence for stenosis. Mild regurgitation.  ------------------------------------------------------------ Left atrium: The atrium was moderately dilated.  ------------------------------------------------------------ Right ventricle: The cavity size was normal. Systolic function was normal. RV TAPSE is 1.8 cm.  ------------------------------------------------------------ Pulmonic valve: Not well visualized. Doppler: There was no evidence for stenosis. No significant regurgitation.  ------------------------------------------------------------ Tricuspid valve: Normal thickness leaflets. Doppler: There was no evidence for stenosis. Mild regurgitation.  ------------------------------------------------------------ Pulmonary artery: PASP is borderline elevated.  ------------------------------------------------------------ Right atrium: The atrium was mildly dilated.  ------------------------------------------------------------ Pericardium: There was no pericardial effusion.  ------------------------------------------------------------ Systemic veins: Inferior vena cava: The vessel was normal in size; the respirophasic diameter changes were in the normal range (= 50%); findings are consistent with normal central venous pressure.  Dobutamine Echo 12/23/2013: Study Conclusions  Stress ECG conclusions: The stress ECG was non-diagnostic due to baseline LBBB.  Impressions:  - Dobutamine echocardiogram to assess severity of AS. Note baseline LV function appeared to be severely reduced on parasternal  images but no further imaging of LV function performed. Baseline images reveal peak velocity of  3.75 m/s with mean gradient of 31 mmHg and AVA of 0.4 cm2; with dobutamine at 20 ug, peak velocity increased to 4.2 m/s and mean gradient of 38 mmHg with AVA 0.4 cm2. Findings consistent with severe AS.  Cardiac Cath 11/30/2011: Cardiac Catheterization Operative Report  DEZARIAH MASINO  RC:[id number]  4/22/201312:53 PM  [name], MD, MD  Procedure Performed:  1. Left Heart Catheterization 2. Selective Coronary Angiography 3. Right Heart Catheterization 4. Left ventricular angiogram Operator: Lauree Chandler, MD  Indication: Cardiomyopathy. Admitted to [facility] with CHF. She is known to have global LV systolic dysfunction with LVEF of 20-25% by echo March 2013. She has been diuresed and doing well. Transferred here for cath.  Procedure Details:  The risks, benefits, complications, treatment options, and expected outcomes were discussed with the patient. The patient and/or family concurred with the proposed plan, giving informed consent. The patient was brought to the cath lab after IV hydration was begun and oral premedication was given. The patient was further sedated with Versed and Fentanyl. The right groin was prepped and draped in the usual manner. Using the modified Seldinger access technique, a 5 French sheath was placed in the right femoral artery. A 6 French sheath was inserted into the right femoral vein. A balloon tipped catheter was used to perform a right heart catheterization. Standard diagnostic catheters were used to perform selective coronary angiography. A pigtail catheter was used to perform a left ventricular angiogram. There were no immediate complications. The patient was taken to the recovery area in stable condition.  Hemodynamic Findings:  Ao: 103/73  LV: 116/4/6  RA: 6  RV: 37/6/7  PA: 41/12 (mean 20)  PCWP: 8  Fick Cardiac Output: 3.86 L/min  Fick Cardiac Index: 2.02 L/min/m2  Central Aortic Saturation: 95%  Pulmonary Artery Saturation: 55%  Angiographic  Findings:  Left main: No evidence of disease.  Left Anterior Descending Artery: Large caliber vessel that courses to the apex. Large caliber diagonal branch. No evidence of disease.  Circumflex Artery: Large caliber vessel with large Obtuse marginal branch. No evidence of disease.  Right Coronary Artery: Moderate sized dominant vessel. No evidence of disease.  Left Ventricular Angiogram: LVEF 20-25%.  Impression:  1. No angiographic evidence of CAD  2. Severe LV systolic dysfunction secondary to non-ischemic cardiomyopathy  3. Normal filling pressures.  Recommendations: Will continue medical management. She may be ready for d/c in the am. She will need consideration for an ICD but will need 3 months of optimal medical therapy before this can be considered.  Complications: None; patient tolerated the procedure well.   STS RISK SCORES Procedure: AV Replacement Risk of Mortality: 5.121% Morbidity or Mortality: 38.764% Long Length of Stay: 23.169% Short Length of Stay: 10.934% Permanent Stroke: 1.953% Prolonged Ventilation: 29.808% DSW Infection: 0.743% Renal Failure: 14.046%   ASSESSMENT AND PLAN: This is a 68 year old woman with evidence of severe symptomatic low gradient aortic stenosis. She has [place] Heart Association functional class II heart failure. Other comorbid conditions include stage 3-4 chronic kidney disease, diabetes with peripheral neuropathy, and very severe LV dysfunction. She is here with one of her 4 sons today and we have discussed the implications of severe aortic stenosis in the context of her specific comorbid medical conditions. I have personally reviewed her echo images. I agree with Dr. [name] and there is a high likelihood of a bicuspid aortic valve. We have  discussed the associated poor prognosis of severe aortic stenosis combined with cardiomyopathy. While the patient was critically ill with ventilatory dependent respiratory failure and heart failure in 2013,  she has had a good recovery and now has reasonably good functional capacity.  We discussed treatment options for severe aortic stenosis at length. These include palliative medical therapy, open surgical aortic valve replacement, and transcatheter aortic valve replacement. She is particularly interested in transcatheter aortic valve replacement as she would like to avoid open heart surgery. While her surgical risk is moderately increased based on STS risk calculations, I have some reservations about TAVR in her particular case. These include relatively young age, need for multiple CT scan studies with contrast in the setting of CKD, possible bicuspid aortic valve morphology, and very severe LV dysfunction. Prior to proceeding with further evaluation, I have recommended that the patient see Dr Roxy Manns in consultation for a formal cardiac surgical opinion. If he feels TAVR should be considered, I would recommended moving forward with CT studies as per protocol. These studies would be done with pre-hydration. A gated cardiac CT would be particularly important to define whether her aortic valve is bicuspid.  Sherren Mocha 01/14/2014 6:18 PM

## 2014-01-14 NOTE — Patient Instructions (Signed)
You have been referred to Dr Roxy Manns at [facility].

## 2014-01-24 ENCOUNTER — Encounter: Payer: Medicare Other | Admitting: Thoracic Surgery (Cardiothoracic Vascular Surgery)

## 2014-01-29 ENCOUNTER — Encounter: Payer: Self-pay | Admitting: Thoracic Surgery (Cardiothoracic Vascular Surgery)

## 2014-01-29 ENCOUNTER — Institutional Professional Consult (permissible substitution) (INDEPENDENT_AMBULATORY_CARE_PROVIDER_SITE_OTHER): Payer: Medicare Other | Admitting: Thoracic Surgery (Cardiothoracic Vascular Surgery)

## 2014-01-29 VITALS — BP 117/73 | HR 83 | Resp 20 | Ht 66.0 in | Wt 178.0 lb

## 2014-01-29 DIAGNOSIS — I359 Nonrheumatic aortic valve disorder, unspecified: Secondary | ICD-10-CM

## 2014-01-29 DIAGNOSIS — I350 Nonrheumatic aortic (valve) stenosis: Secondary | ICD-10-CM

## 2014-01-29 DIAGNOSIS — I509 Heart failure, unspecified: Secondary | ICD-10-CM

## 2014-01-29 DIAGNOSIS — I5022 Chronic systolic (congestive) heart failure: Secondary | ICD-10-CM

## 2014-01-29 DIAGNOSIS — I428 Other cardiomyopathies: Secondary | ICD-10-CM

## 2014-01-29 DIAGNOSIS — I5042 Chronic combined systolic (congestive) and diastolic (congestive) heart failure: Secondary | ICD-10-CM

## 2014-01-29 NOTE — Progress Notes (Signed)
HEART AND [place] SURGERY CONSULTATION REPORT  Referring Provider is [name] Mocha, MD Primary Cardiologist is [name] Sark, MD PCP is [name] Nakayama, MD  Chief Complaint  Patient presents with  . Aortic Stenosis    HPI:  Patient is a 68 year old married African American female from [place] with long-standing history of aortic stenosis, non-ischemic cardiomyopathy with chronic combined systolic and diastolic congestive heart failure, hypertension, type II diabetes mellitus and chronic kidney disease referred to discuss treatment options for the management of low-gradient, low-output severe symptomatic aortic stenosis.  The patient states that she was first told that she had a heart murmur during pregnancy with one of her children. More recently she was followed by Dr. Lattie [name] for aortic stenosis that was felt to be mild in severity.  She developed progressive LV systolic dysfunction on follow up echocardiograms with ejection fraction estimated 20-25% on echocardiogram performed on 11/07/2011.  At that time the velocity across the aortic valve measured just over 3 m/sec corresponding to a mean transvalvular gradient estimated 25 mmHg.  In April of 2013 she was hospitalized at [facility] with acute respiratory failure requiring intubation and mechanical ventilation due to congestive heart failure with pulmonary edema.  She was diuresed 25 pounds and stabilized medically. She was ultimately transferred to [facility] where she underwent left and right heart catheterization. She was found to have normal coronary artery anatomy with no significant coronary artery disease.  Pulmonary artery pressures were only mildly elevated. Trans-valvular gradient across the aortic valve was not assessed. The patient has been treated medically ever since and remained clinically stable.  She was seen recently in followup by [name] [name].  Transthoracic echocardiogram demonstrated further decline in left ventricular function with ejection fraction 15%.  The severity of aortic stenosis appeared worse with peak velocity measured 3.7 m/s corresponding to estimated peak and mean transvalvular gradients of 59 and 32 mmHg, respectively.  A dobutamine echocardiogram was performed, confirming the presence of low-flow, low-gradient severe aortic stenosis with peak velocity across the aortic valve increasing to over 4.2 m/s during dobutamine infusion.  The patient was initially referred to [name] [name] to discuss treatment options, and the patient has now been referred for surgical consultation.  The patient is married and lives with her husband and granddaughter. She remains fairly active physically and functionally independant. She states that she gets tired more easily than she used to, but for the most part she is able to remain reasonably active and without significant limitations. She states that she only gets short of breath if she really pushes herself physically. She denies exertional shortness of breath with ordinary activity. She denies any recent history of resting shortness of breath, PND, orthopnea, or lower extremity edema.  She keeps track of her weight on a daily basis and is accustomed to adjusting medical therapy for chronic congestive heart failure. Her mobility is fairly good, although she is somewhat limited because of degenerative arthritis in her left knee.  Overall she states that she does feel fairly good and she has remained clinically stable for the past 2 years.  However, she also states that she feels as though "something" seems to be slowly getting worse, and she mentions some occasional symptoms of atypical chest discomfort.  Past Medical History  Diagnosis Date  . Essential hypertension, benign   . Hyperlipidemia   . Anxiety   . Aortic stenosis   . History of pneumonia 10/2011  . Symptomatic  PVCs   . Cardiomyopathy      EF of 20-25% on echo in 10/2011, has declined ICD  . Diabetes mellitus, type 2   . Arthritis   . Nonischemic cardiomyopathy 10/14/2011  . CKD (chronic kidney disease) stage 3, GFR 30-59 ml/min 05/21/2013  . Diabetes mellitus, insulin dependent (IDDM), uncontrolled 10/24/2007    hBA1C is 8.1 in 11/2013   . LBBB (left bundle branch block) 12/03/2013  . Chronic systolic heart failure [id number]  . Obesity 10/24/2007  . Chronic combined systolic and diastolic CHF (congestive heart failure)     Past Surgical History  Procedure Laterality Date  . Tubal ligation    . Cesarean section      Family History  Problem Relation Age of Onset  . Heart disease Mother   . Heart attack Mother   . Diabetes Mother   . Hypertension Mother   . Colon cancer Father   . Breast cancer Sister   . Diabetes Brother   . Hypertension Brother   . Stroke Brother     History   Social History  . Marital Status: Married    Spouse Name: N/A    Number of Children: 62  . Years of Education: N/A   Occupational History  . Employed    Social History Main Topics  . Smoking status: Never Smoker   . Smokeless tobacco: Never Used  . Alcohol Use: No  . Drug Use: No  . Sexual Activity: No   Other Topics Concern  . Not on file   Social History Narrative  . No narrative on file    Current Outpatient Prescriptions  Medication Sig Dispense Refill  . aspirin EC 81 MG tablet Take 1 tablet (81 mg total) by mouth daily.  150 tablet  2  . atorvastatin (LIPITOR) 80 MG tablet TAKE ONE TABLET DAILY.  30 tablet  3  . carvedilol (COREG) 25 MG tablet Take 1 tablet (25 mg total) by mouth 2 (two) times daily.  60 tablet  6  . fenofibrate (TRICOR) 145 MG tablet TAKE ONE TABLET BY MOUTH ONCE DAILY.  30 tablet  2  . furosemide (LASIX) 40 MG tablet Take 1 tablet (40 mg total) by mouth every morning.  30 tablet  6  . glucose blood (EASYMAX TEST) test strip Use as instructed for once daily testing dx 250.00  50 each  5  . insulin  NPH Human (HUMULIN N) 100 UNIT/ML injection Inject 10 Units into the skin 2 (two) times daily before a meal.      . Multiple Vitamin (MULITIVITAMIN WITH MINERALS) TABS Take 1 tablet by mouth every morning.      . niacin (NIASPAN) 1000 MG CR tablet TAKE (1) TABLET BY MOUTH AT BEDTIME.  30 tablet  4  . spironolactone (ALDACTONE) 25 MG tablet Take 1 tablet (25 mg total) by mouth every morning.  90 tablet  3  . [DISCONTINUED] cetirizine (ZYRTEC) 10 MG tablet Take 1 tablet (10 mg total) by mouth daily.  30 tablet  3  . [DISCONTINUED] ferrous sulfate 325 (65 FE) MG EC tablet Take 1 tablet (325 mg total) by mouth 2 (two) times daily.  60 tablet  5  . [DISCONTINUED] FLUoxetine (PROZAC) 10 MG tablet Take 1 tablet (10 mg total) by mouth daily. Take one capsule by mouth once a day  30 tablet  3  . [DISCONTINUED] fluticasone (FLONASE) 50 MCG/ACT nasal spray Place 2 sprays into the nose daily.  16 g  3  No current facility-administered medications for this visit.    Allergies  Allergen Reactions  . Penicillins     Family unsure of reaction, but certain mom said she was allergic      Review of Systems:   General:  normal appetite, decreased energy, no weight gain, no weight loss, no fever  Cardiac:  no chest pain with exertion, occasional atypical chest pain at rest, + SOB with exertion, no resting SOB, no PND, no orthopnea, no palpitations, no arrhythmia, no atrial fibrillation, no LE edema, no dizzy spells, no syncope  Respiratory:  no shortness of breath, no home oxygen, no productive cough, occasional dry cough, no bronchitis, no wheezing, no hemoptysis, no asthma, no pain with inspiration or cough, no sleep apnea, no CPAP at night  GI:   no difficulty swallowing, no reflux, no frequent heartburn, no hiatal hernia, no abdominal pain, occasional constipation, no diarrhea, no hematochezia, no hematemesis, no melena  GU:   no dysuria,  no frequency, no urinary tract infection, no hematuria, no kidney  stones, + chronic kidney disease  Vascular:  no pain suggestive of claudication, no pain in feet, no leg cramps, + varicose veins, no DVT, no non-healing foot ulcer  Neuro:   no stroke, no TIA's, no seizures, no headaches, no temporary blindness one eye,  no slurred speech, no peripheral neuropathy, no chronic pain, no instability of gait, no memory/cognitive dysfunction  Musculoskeletal: + arthritis particularly in left knee, no joint swelling, no myalgias, mild difficulty walking, normal mobility   Skin:   no rash, no itching, no skin infections, no pressure sores or ulcerations  Psych:   no anxiety, no depression, no nervousness, no unusual recent stress  Eyes:   no blurry vision, + floaters, no recent vision changes, + wears glasses or contacts  ENT:   no hearing loss, no loose or painful teeth, no dentures, last saw dentist many years ago, only 2 or 3 remaining teeth that are in poor condition  Hematologic:  no easy bruising, no abnormal bleeding, no clotting disorder, no frequent epistaxis  Endocrine:  + diabetes, checks CBG's at home           Physical Exam:   BP 117/73  Pulse 83  Resp 20  Ht 5\' 6"  (1.676 m)  Wt 178 lb (80.74 kg)  BMI 28.74 kg/m2  SpO2 98%  General:  Mildly obese,  well-appearing  HEENT:  Unremarkable   Neck:   no JVD, no bruits, no adenopathy   Chest:   clear to auscultation, symmetrical breath sounds, no wheezes, no rhonchi   CV:   RRR, grade IV/VI crescendo/decrescendo murmur heard best at RUSB,  no diastolic murmur  Abdomen:  soft, non-tender, no masses   Extremities:  warm, well-perfused, pulses not palpable, no LE edema  Rectal/GU  Deferred  Neuro:   Grossly non-focal and symmetrical throughout  Skin:   Clean and dry, no rashes, no breakdown   Diagnostic Tests:  Transthoracic Echocardiography  Patient:    [name], [name] MR #:       [id number] Study Date: 11/20/2013 Gender:     F Age:        18 Height:     157.5cm Weight:     80.3kg BSA:         1.37m^2 Pt. Status: Room:    REFERRING    Rozann Lesches  ATTENDING    Oretha Milch, Javier Glazier,  Curt Bears  SONOGRAPHER  Lina Sar, RDCS  PERFORMING   Chmg, Forestine Na cc:  ------------------------------------------------------------ LV EF: 15%  ------------------------------------------------------------ Indications:      Aortic stenosis 424.1.  Cardiomyopathy - ischemic 414.8.  ------------------------------------------------------------ History:   PMH:   Congestive heart failure.  Aortic valve disease.  Risk factors:  ischemic cardiomyopathy Hypertension. Dyslipidemia.  ------------------------------------------------------------ Study Conclusions  - Study data: Technically adequate study. - Left ventricle: The cavity size was mildly dilated. Wall   thickness was normal. The estimated ejection fraction was   15%. Diffuse hypokinesis. Doppler parameters are   consistent with abnormal left ventricular relaxation   (grade 1 diastolic dysfunction). - Aortic valve: Moderately to severely calcified annulus.   Severely thickened leaflets. There was severe stenosis by   continuity equation with moderately elevated gradient   consistent with severe low flow low gradient aortic   stenosis in the setting of severe LV systolic dysfunction.   Mild to moderate regurgitation. Valve area: 0.67cm^2(VTI).   Valve area: 0.62cm^2 (Vmax). Regurgitation pressure   half-time: 464ms. - Mitral valve: Mildly to moderately calcified annulus.   Mildly thickened leaflets . Mild regurgitation. - Left atrium: The atrium was moderately dilated. - Right atrium: The atrium was mildly dilated. - Pulmonary arteries: PA peak pressure: 48mm Hg (S). PASP is   borderline elevated. Transthoracic echocardiography.  M-mode, complete 2D, spectral Doppler, and color Doppler.  Height:  Height: 157.5cm. Height: 62in.  Weight:  Weight: 80.3kg.  Weight: 176.6lb.  Body mass index:  BMI: 32.4kg/m^2.  Body surface area:    BSA: 1.76m^2.  Blood pressure:     115/52.  Patient status:  Outpatient.  Location:  Echo laboratory.  ------------------------------------------------------------  ------------------------------------------------------------ Left ventricle:  The cavity size was mildly dilated. Wall thickness was normal. The estimated ejection fraction was 15%. Diffuse hypokinesis. The anteroseptal wall is thinned and akinetic. Doppler parameters are consistent with abnormal left ventricular relaxation (grade 1 diastolic dysfunction).  ------------------------------------------------------------ Aortic valve:   Moderately to severely calcified annulus. Severely thickened leaflets. Uncertain number of leaflets. Doppler:  There was severe stenosis by continuity equation with moderately elevated gradient consistent with severe low flow low gradient aortic stenosis in the setting of severe LV systolic dysfunction.  Mild to moderate regurgitation. VTI ratio of LVOT to aortic valve: 0.21. Valve area: 0.67cm^2(VTI). Indexed valve area: 0.37cm^2/m^2 (VTI). Peak velocity ratio of LVOT to aortic valve: 0.2. Valve area: 0.62cm^2 (Vmax). Indexed valve area: 0.34cm^2/m^2 (Vmax). Mean gradient: 61mm Hg (S). Peak gradient: 34mm Hg (S).  ------------------------------------------------------------ Aorta:  Aortic root: The aortic root was normal in size.  ------------------------------------------------------------ Mitral valve:   Mildly to moderately calcified annulus. Mildly thickened leaflets .  Doppler:   There was no evidence for stenosis.    Mild regurgitation.  ------------------------------------------------------------ Left atrium:  The atrium was moderately dilated.  ------------------------------------------------------------ Right ventricle:  The cavity size was normal. Systolic function was normal. RV TAPSE is 1.8  cm.  ------------------------------------------------------------ Pulmonic valve:   Not well visualized.  Doppler:   There was no evidence for stenosis.    No significant regurgitation.   ------------------------------------------------------------ Tricuspid valve:   Normal thickness leaflets.  Doppler: There was no evidence for stenosis.    Mild regurgitation.   ------------------------------------------------------------ Pulmonary artery:   PASP is borderline elevated.  ------------------------------------------------------------ Right atrium:  The atrium was mildly dilated.  ------------------------------------------------------------ Pericardium:  There was no pericardial effusion.  ------------------------------------------------------------ Systemic veins: Inferior vena cava: The vessel was normal in size; the respirophasic diameter changes were in the  normal range (= 50%); findings are consistent with normal central venous pressure.  ------------------------------------------------------------  2D measurements        Normal  Doppler measurements   Normal Left ventricle                 Main pulmonary LVID ED,     61 mm     43-52   artery chord,                         Pressure    35 mm Hg   =30 PLAX                           , S LVID ES,     52 mm     23-38   LVOT chord,                         Peak     73.84 cm/s    ------ PLAX                           vel, S FS, chord,   15 %      >29     VTI, S   17.87 cm      ------ PLAX                           Stroke    56.1 ml      ------ LVPW, ED      9 mm     ------  vol IVS/LVPW      1        <1.3    Stroke      31 ml/m^2  ------ ratio, ED                      index Ventricular septum             Aortic valve IVS, ED       9 mm     ------  Peak       372 cm/s    ------ LVOT                           vel, S Diam, S      20 mm     ------  Mean       241 cm/s    ------ Area       3.14 cm^2   ------  vel, S Aorta                           VTI, S    83.2 cm      ------ Root diam,   29 mm     ------  Mean        32 mm Hg   ------ ED                             gradient Left atrium                    , S AP dim       40 mm     ------  Peak  59 mm Hg   ------ Vol index,   34 ml/m^2 ------  gradient S                              , S                                VTI       0.21         ------                                ratio                                LVOT/AV                                Area,     0.67 cm^2    ------                                VTI                                Area      0.37 cm^2/m^ ------                                index          2                                (VTI)                                Peak vel   0.2         ------                                ratio,                                LVOT/AV                                Area,     0.62 cm^2    ------                                Vmax                                Area      0.34 cm^2/m^ ------  index          2                                (Vmax)                                Regurg     450 ms      ------                                PHT                                Mitral valve                                Peak E/A   0.5         ------                                ratio                                Right ventricle                                Sa vel,   15.1 cm/s    ------                                lat ann,                                tiss DP   ------------------------------------------------------------ Prepared and Electronically Authenticated by  Kerry Hough, M.D. 2015-04-08T16:49:[id number]    Stress Echocardiography  Patient:    Golie, Simoni MR #:       [id number] Study Date: 12/23/2013 Gender:     F Age:        36 Height:     157.5cm Weight:     80.9kg BSA:        1.71m^2 Pt. Status: Room:    ATTENDING    Buel Ream, Lisabeth Devoid  SONOGRAPHER  [place], Outpatient cc:  ------------------------------------------------------------  ------------------------------------------------------------ Indications:      Aortic stenosis 424.1.  ------------------------------------------------------------ History:   PMH:  Non-ischemic cardiomyopathy. Aortic stenosis.  Mitral valve disease.  Risk factors: Hypertension. Dyslipidemia.  ------------------------------------------------------------ Study Conclusions  Stress ECG conclusions: The stress ECG was non-diagnostic due to baseline LBBB.  Impressions:  - Dobutamine echocardiogram to assess severity of AS. Note   baseline LV function appeared to be severely reduced on   parasternal images but no further imaging of LV function   performed. Baseline images reveal peak velocity of 3.75   m/s with mean gradient of 31 mmHg and AVA of 0.4 cm2; with   dobutamine at 20 ug, peak velocity  increased to 4.2 m/s   and mean gradient of 38 mmHg with AVA 0.4 cm2. Findings   consistent with severe AS. Dobutamine. Stress echocardiography.  2D.  Height:  Height: 157.5cm. Height: 62in.  Weight:  Weight: 80.9kg. Weight: 178lb.  Body mass index:  BMI: 32.6kg/m^2.  Body surface area:    BSA: 1.41m^2.  Blood pressure:     99/51.  Patient status:  Outpatient.  ------------------------------------------------------------  ------------------------------------------------------------ Aortic valve:   Doppler:     VTI ratio of LVOT to aortic valve: 0.12. Indexed valve area: 0.2cm^2/m^2 (VTI). Peak velocity ratio of LVOT to aortic valve: 0.13. Valve area: 0.4cm^2 (Vmax). Indexed valve area: 0.21cm^2/m^2 (Vmax). Mean gradient: 86mm Hg (S). Peak gradient: 12mm Hg (S).   ------------------------------------------------------------ Baseline ECG:   Normal sinus rhythm with left bundle  branch block.  ------------------------------------------------------------ Stress protocol:  +-----------+--+-------+---+------------+--------+---------+ Stage      HRBP     SatRhythm      SymptomsComments               (mmHg)                                  +-----------+--+-------+---+------------+--------+---------+ Baseline   73120/70 95%------------None    ---------              (87)                                    +-----------+--+-------+---+------------+--------+---------+ Dobutamine 73110/76 95%----------------------------- 5 ug/kg/min  (87)                                    +-----------+--+-------+---+------------+--------+---------+ Dobutamine 73124/74 98%Occasional  --------Patient   10           (91)      PVC's               states    ug/kg/min                                  she feels                                            "weird".  +-----------+--+-------+---+------------+--------+---------+ Dobutamine 75130/72 ---Ventricular ----------------- 20           (91)      couplets                      ug/kg/min                                            +-----------+--+-------+---+------------+--------+---------+ Immediate  67--------------------------------------- post stress                                          +-----------+--+-------+---+------------+--------+---------+ Recovery; [id number] 97%----------------------------- min          (102)                                   +-----------+--+-------+---+------------+--------+---------+  Recovery; 283--------------------------------------- min                                                  +-----------+--+-------+---+------------+--------+---------+ Recovery; 375--------------------------------------- min                                                   +-----------+--+-------+---+------------+--------+---------+ Recovery; [id number] -------------------------------- min          (101)                                   +-----------+--+-------+---+------------+--------+---------+ Recovery; 570-------96%----------------------------- min                                                  +-----------+--+-------+---+------------+--------+---------+ Recovery; 688--------------------------------------- min                                                  +-----------+--+-------+---+------------+--------+---------+  ------------------------------------------------------------ Stress results:   Maximal heart rate during stress was 88bpm. The maximal predicted heart rate was 152bpm.The target heart rate was not achieved. The heart rate response to stress was normal. There was a normal resting blood pressure. Normal blood pressure response to dobutamine. The rate-pressure product for the peak heart rate and blood pressure was 10232mm Hg/min.  The patient experienced no chest pain during stress.  ------------------------------------------------------------ Stress ECG:   Isolated ventricular ectopy. The stress ECG was non-diagnostic due to baseline LBBB.  ------------------------------------------------------------ Baseline:  Low dose: Peak stress: Recovery:  ------------------------------------------------------------  2D measurements   Normal        Doppler measurements   Norma LVOT                                                   l Diam, S   20 mm   ------        LVOT Area    3.14 cm^2 ------        Peak vel,  49 cm/s     -----                                 S                                 VTI, S    10. cm       -----                                             5  Aortic valve                                 Peak vel, 389 cm/s     -----                                  S                                 Mean vel, 267 cm/s     -----                                 S                                 VTI, S    86. cm       -----                                             8                                 Mean       34 mm Hg    -----                                 gradient,                                 S                                 Peak       61 mm Hg    -----                                 gradient,                                 S                                 VTI ratio 0.1          -----                                 LVOT/AV     2                                 Area      0.2 cm^2/m^2 -----  index                                 (VTI)                                 Peak vel  0.1          -----                                 ratio,      3                                 LVOT/AV                                 Area,     0.4 cm^2     -----                                 Vmax                                 Area      0.2 cm^2/m^2 -----                                 index       1                                 (Vmax)   ------------------------------------------------------------ Prepared and Electronically Authenticated by  Kirk Ruths 2015-05-11T19:09:09.093    Cardiac Catheterization Operative Report  FRANZISKA STRENGTH  [id number]  4/22/201312:53 PM  [name] Nakayama, MD, MD  Procedure Performed:  1. Left Heart Catheterization 2. Selective Coronary Angiography 3. Right Heart Catheterization 4. Left ventricular angiogram Operator: Lauree Chandler, MD  Indication: Cardiomyopathy. Admitted to [facility] with CHF. She is known to have global LV systolic dysfunction with LVEF of 20-25% by echo March 2013. She has been diuresed and doing well. Transferred here for cath.  Procedure Details:  The risks, benefits, complications, treatment options, and expected outcomes were  discussed with the patient. The patient and/or family concurred with the proposed plan, giving informed consent. The patient was brought to the cath lab after IV hydration was begun and oral premedication was given. The patient was further sedated with Versed and Fentanyl. The right groin was prepped and draped in the usual manner. Using the modified Seldinger access technique, a 5 French sheath was placed in the right femoral artery. A 6 French sheath was inserted into the right femoral vein. A balloon tipped catheter was used to perform a right heart catheterization. Standard diagnostic catheters were used to perform selective coronary angiography. A pigtail catheter was used to perform a left ventricular angiogram. There were no immediate complications. The patient was taken to the recovery area in stable condition.  Hemodynamic Findings:  Ao: 103/73  LV: 116/4/6  RA: 6  RV: 37/6/7  PA: 41/12 (  mean 20)  PCWP: 8  Fick Cardiac Output: 3.86 L/min  Fick Cardiac Index: 2.02 L/min/m2  Central Aortic Saturation: 95%  Pulmonary Artery Saturation: 55%  Angiographic Findings:  Left main: No evidence of disease.  Left Anterior Descending Artery: Large caliber vessel that courses to the apex. Large caliber diagonal branch. No evidence of disease.  Circumflex Artery: Large caliber vessel with large Obtuse marginal branch. No evidence of disease.  Right Coronary Artery: Moderate sized dominant vessel. No evidence of disease.  Left Ventricular Angiogram: LVEF 20-25%.  Impression:  1. No angiographic evidence of CAD  2. Severe LV systolic dysfunction secondary to non-ischemic cardiomyopathy  3. Normal filling pressures.  Recommendations: Will continue medical management. She may be ready for d/c in the am. She will need consideration for an ICD but will need 3 months of optimal medical therapy before this can be considered.  Complications: None; patient tolerated the procedure well.     STS Risk  Calculator  Procedure    AVR  Risk of Mortality   5.1% Morbidity or Mortality  38.7% Prolonged LOS   23.2% Short LOS    10.9% Permanent Stroke   2.0% Prolonged Vent Support  29.8% DSW Infection    0.7% Renal Failure    14.0% Reoperation    13.0%    Impression:  The patient has stage D severe symptomatic low gradient aortic stenosis with underlying severe left ventricular systolic dysfunction.  She originally presented 2 years ago with acute respiratory failure and pulmonary edema, but since then she has remained remarkably stable on chronic medical therapy.  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  However, recent echocardiograms suggest further deterioration left ventricular systolic function with simultaneous worsening of severity of aortic stenosis.  I have personally reviewed the patient's previous catheterization and recent echocardiograms. I am also concerned that her aortic valve may be bicuspid, and there is no question that the calcification in the leaflet restriction appears to be somewhat asymmetrical.  I would be a bit more cautious to consider transcatheter aortic valve replacement as an alternative to conventional surgery if the valve is in fact bicuspid, particularly given the fact that her LV function is severely depressed.  In addition, risks associated with conventional surgery would be only moderately increased as predicted using traditional risk models such as the STS risk calculator. However, the appearance of the patient's echocardiogram also suggests the presence of a relatively small aortic root.  This might further increase the risks associated with conventional surgery and/or potentially increase the chances for significant residual stenosis if aortic valve replacement is performed without root enlargement or root  replacement.   Plan:  The patient and her son were counseled at length regarding treatment alternatives for management of severe symptomatic aortic stenosis. The risks and benefits of surgical intervention has been discussed in detail. Long-term prognosis with medical therapy was discussed. Alternative approaches such as conventional surgical aortic valve replacement, transcatheter aortic valve replacement, and palliative medical therapy were compared and contrasted at length. This discussion was placed in the context of the patient's own specific clinical presentation and past medical history.  All of their questions been addressed.  The patient is interested in proceeding with definitive treatment at some point in the not too distant future. She is particularly interested in transcatheter aortic valve replacement, although she understands that TAVR may not prove to  be a good choice for her for a variety of reasons.   As a next step we plan to proceed with cardiac gated CT angiogram of the heart to further characterize the functional anatomy of her aortic valve and whether or not TAVR might be anatomically feasible.  This will additionally give [place] better characterization of the size of her aortic root.  At some point she will need repeat diagnostic catheterization performed including direct measurement of transvalvular gradient and right heart catheterization.  Because of her chronic kidney disease these procedures will be staged with plenty of time to allow her renal function to recover. She needs consultation with the dental service as well and likely will need dental extraction.  The patient will return in 3 weeks for followup.      Valentina Gu. Roxy Manns, MD 01/29/2014 4:21 PM

## 2014-01-29 NOTE — Patient Instructions (Signed)
Schedule cardiac gated CTA of heart  Schedule appointment in dental clinic

## 2014-01-30 ENCOUNTER — Other Ambulatory Visit: Payer: Self-pay | Admitting: *Deleted

## 2014-01-30 DIAGNOSIS — I359 Nonrheumatic aortic valve disorder, unspecified: Secondary | ICD-10-CM

## 2014-02-04 ENCOUNTER — Other Ambulatory Visit: Payer: Self-pay | Admitting: Thoracic Surgery (Cardiothoracic Vascular Surgery)

## 2014-02-04 LAB — BASIC METABOLIC PANEL WITH GFR
BUN: 34 mg/dL — ABNORMAL HIGH (ref 6–23)
CO2: 23 mEq/L (ref 19–32)
Calcium: 9.6 mg/dL (ref 8.4–10.5)
Chloride: 107 mEq/L (ref 96–112)
Creat: 1.7 mg/dL — ABNORMAL HIGH (ref 0.50–1.10)
GFR, Est African American: 35 mL/min — ABNORMAL LOW
GFR, Est Non African American: 31 mL/min — ABNORMAL LOW
Glucose, Bld: 205 mg/dL — ABNORMAL HIGH (ref 70–99)
Potassium: 4 mEq/L (ref 3.5–5.3)
Sodium: 139 mEq/L (ref 135–145)

## 2014-02-10 ENCOUNTER — Ambulatory Visit (HOSPITAL_COMMUNITY)
Admission: RE | Admit: 2014-02-10 | Discharge: 2014-02-10 | Disposition: A | Discharge status: Home / Self Care | Payer: Medicare Other | Source: Ambulatory Visit | Attending: Thoracic Surgery (Cardiothoracic Vascular Surgery) | Admitting: Thoracic Surgery (Cardiothoracic Vascular Surgery)

## 2014-02-10 DIAGNOSIS — I359 Nonrheumatic aortic valve disorder, unspecified: Secondary | ICD-10-CM

## 2014-02-10 DIAGNOSIS — R911 Solitary pulmonary nodule: Secondary | ICD-10-CM | POA: Insufficient documentation

## 2014-02-10 DIAGNOSIS — J9819 Other pulmonary collapse: Secondary | ICD-10-CM | POA: Insufficient documentation

## 2014-02-10 MED ORDER — METOPROLOL TARTRATE 1 MG/ML IV SOLN
2.5000 mg | Freq: Once | INTRAVENOUS | Status: AC
Start: 2014-02-10 — End: 2014-02-10
  Administered 2014-02-10: 2.5 mg via INTRAVENOUS
  Filled 2014-02-10: qty 5

## 2014-02-10 MED ORDER — IOHEXOL 350 MG/ML SOLN
100.0000 mL | Freq: Once | INTRAVENOUS | Status: AC | PRN
  Administered 2014-02-10: 100 mL via INTRAVENOUS

## 2014-02-10 MED ORDER — NITROGLYCERIN 0.4 MG SL SUBL
SUBLINGUAL_TABLET | SUBLINGUAL | Status: AC
  Filled 2014-02-10: qty 1

## 2014-02-10 MED ORDER — METOPROLOL TARTRATE 1 MG/ML IV SOLN
INTRAVENOUS | Status: AC
  Filled 2014-02-10: qty 5

## 2014-02-12 ENCOUNTER — Other Ambulatory Visit (HOSPITAL_COMMUNITY): Payer: Medicare Other | Admitting: Dentistry

## 2014-02-12 ENCOUNTER — Telehealth (HOSPITAL_COMMUNITY): Payer: Self-pay

## 2014-02-12 NOTE — Telephone Encounter (Signed)
02/12/14   Broken appt./ Patient called on day of appt. for Dental Consult w/Dr. Enrique Sack for 02/12/14 @ 8;45 to cancel due to husband being ill. Patient will call back to reschedule Dental Consult w/ Dr. Enrique Sack at a later date.  LRI

## 2014-02-17 ENCOUNTER — Ambulatory Visit: Payer: Medicare Other | Admitting: Thoracic Surgery (Cardiothoracic Vascular Surgery)

## 2014-02-19 ENCOUNTER — Encounter (HOSPITAL_COMMUNITY): Payer: Self-pay | Admitting: Dentistry

## 2014-02-19 ENCOUNTER — Encounter (INDEPENDENT_AMBULATORY_CARE_PROVIDER_SITE_OTHER): Payer: Self-pay

## 2014-02-19 ENCOUNTER — Ambulatory Visit (HOSPITAL_COMMUNITY): Payer: Self-pay | Admitting: Dentistry

## 2014-02-19 ENCOUNTER — Other Ambulatory Visit: Payer: Self-pay | Admitting: Family Medicine

## 2014-02-19 VITALS — BP 106/58 | HR 64 | Temp 98.6°F

## 2014-02-19 DIAGNOSIS — K083 Retained dental root: Secondary | ICD-10-CM

## 2014-02-19 DIAGNOSIS — IMO0002 Reserved for concepts with insufficient information to code with codable children: Secondary | ICD-10-CM

## 2014-02-19 DIAGNOSIS — K0889 Other specified disorders of teeth and supporting structures: Secondary | ICD-10-CM

## 2014-02-19 DIAGNOSIS — K029 Dental caries, unspecified: Secondary | ICD-10-CM

## 2014-02-19 DIAGNOSIS — M264 Malocclusion, unspecified: Secondary | ICD-10-CM

## 2014-02-19 DIAGNOSIS — Z0189 Encounter for other specified special examinations: Secondary | ICD-10-CM

## 2014-02-19 DIAGNOSIS — K0530 Chronic periodontitis, unspecified: Secondary | ICD-10-CM | POA: Insufficient documentation

## 2014-02-19 DIAGNOSIS — M270 Developmental disorders of jaws: Secondary | ICD-10-CM | POA: Insufficient documentation

## 2014-02-19 DIAGNOSIS — K045 Chronic apical periodontitis: Secondary | ICD-10-CM

## 2014-02-19 DIAGNOSIS — K0820 Unspecified atrophy of edentulous alveolar ridge: Secondary | ICD-10-CM

## 2014-02-19 DIAGNOSIS — I359 Nonrheumatic aortic valve disorder, unspecified: Secondary | ICD-10-CM

## 2014-02-19 DIAGNOSIS — K036 Deposits [accretions] on teeth: Secondary | ICD-10-CM

## 2014-02-19 DIAGNOSIS — K08104 Complete loss of teeth, unspecified cause, class IV: Secondary | ICD-10-CM

## 2014-02-19 NOTE — Progress Notes (Signed)
DENTAL CONSULTATION  Date of Consultation:  02/19/2014 Patient Name:   [name] [name] Date of Birth:   June 17, 1946 Medical Record Number: [id number]  VITALS: BP 106/58  Pulse 64  Temp(Src) 98.6 F (37 C) (Oral)   CHIEF COMPLAINT: The patient referred by Dr. Darylene Price for a dental consultation.  HPI: [name] [name] is a 68 year old female recently diagnosed with severe aortic stenosis. Patient with anticipated aortic valve replacement with Dr. Roxy Manns. Patient is now seen as part of a medically necessary pre-heart valve surgery dental protocol examination.  Patient currently denies any acute toothache, swellings, or abscesses. Patient has not been seen by a dentist for " a long, long time" . Patient had multiple "back teeth pulled" at that time by report. Patient cannot remember the name of that dentist. Patient denies having any dental complications from those dental extractions. Patient knows that she has multiple" bad teeth ". Patient denies having any partial dentures.   PROBLEM LIST: Patient Active Problem List   Diagnosis Date Noted  . Chronic combined systolic and diastolic CHF (congestive heart failure)   . LBBB (left bundle branch block) 12/03/2013  . CKD (chronic kidney disease) stage 3, GFR 30-59 ml/min 05/21/2013  . Routine general medical examination at a health care facility 07/28/2012  . Chronic systolic heart failure 0000000  . Seasonal allergies 02/22/2012  . Essential hypertension, benign   . Aortic stenosis   . Nonischemic cardiomyopathy 10/14/2011  . Insomnia 03/22/2011  . Diabetes mellitus, insulin dependent (IDDM), uncontrolled 10/24/2007  . HYPERLIPIDEMIA 10/24/2007  . Obesity 10/24/2007    PMH: Past Medical History  Diagnosis Date  . Essential hypertension, benign   . Hyperlipidemia   . Anxiety   . Aortic stenosis   . History of pneumonia 10/2011  . Symptomatic PVCs   . Cardiomyopathy     EF of 20-25% on echo in 10/2011, has declined ICD  .  Diabetes mellitus, type 2   . Arthritis   . Nonischemic cardiomyopathy 10/14/2011  . CKD (chronic kidney disease) stage 3, GFR 30-59 ml/min 05/21/2013  . Diabetes mellitus, insulin dependent (IDDM), uncontrolled 10/24/2007    hBA1C is 8.1 in 11/2013   . LBBB (left bundle branch block) 12/03/2013  . Chronic systolic heart failure [id number]  . Obesity 10/24/2007  . Chronic combined systolic and diastolic CHF (congestive heart failure)     PSH: Past Surgical History  Procedure Laterality Date  . Tubal ligation    . Cesarean section      ALLERGIES: Allergies  Allergen Reactions  . Penicillins     Family unsure of reaction, but certain mom said she was allergic    MEDICATIONS: Current Outpatient Prescriptions  Medication Sig Dispense Refill  . aspirin EC 81 MG tablet Take 1 tablet (81 mg total) by mouth daily.  150 tablet  2  . atorvastatin (LIPITOR) 80 MG tablet TAKE ONE TABLET DAILY.  30 tablet  3  . carvedilol (COREG) 25 MG tablet Take 1 tablet (25 mg total) by mouth 2 (two) times daily.  60 tablet  6  . fenofibrate (TRICOR) 145 MG tablet TAKE ONE TABLET BY MOUTH ONCE DAILY.  30 tablet  2  . furosemide (LASIX) 40 MG tablet Take 1 tablet (40 mg total) by mouth every morning.  30 tablet  6  . glucose blood (EASYMAX TEST) test strip Use as instructed for once daily testing dx 250.00  50 each  5  . insulin NPH Human (HUMULIN N) 100 UNIT/ML injection Inject  10 Units into the skin 2 (two) times daily before a meal.      . Multiple Vitamin (MULITIVITAMIN WITH MINERALS) TABS Take 1 tablet by mouth every morning.      . niacin (NIASPAN) 1000 MG CR tablet TAKE (1) TABLET BY MOUTH AT BEDTIME.  30 tablet  4  . spironolactone (ALDACTONE) 25 MG tablet Take 1 tablet (25 mg total) by mouth every morning.  90 tablet  3  . [DISCONTINUED] cetirizine (ZYRTEC) 10 MG tablet Take 1 tablet (10 mg total) by mouth daily.  30 tablet  3  . [DISCONTINUED] ferrous sulfate 325 (65 FE) MG EC tablet Take 1 tablet (325  mg total) by mouth 2 (two) times daily.  60 tablet  5  . [DISCONTINUED] FLUoxetine (PROZAC) 10 MG tablet Take 1 tablet (10 mg total) by mouth daily. Take one capsule by mouth once a day  30 tablet  3  . [DISCONTINUED] fluticasone (FLONASE) 50 MCG/ACT nasal spray Place 2 sprays into the nose daily.  16 g  3   No current facility-administered medications for this visit.    LABS: Lab Results  Component Value Date   WBC 5.3 07/29/2013   HGB 10.5* 07/29/2013   HCT 32.8* 07/29/2013   MCV 79.0 07/29/2013   PLT 235 07/29/2013      Component Value Date/Time   NA 139 02/04/2014 0904   K 4.0 02/04/2014 0904   CL 107 02/04/2014 0904   CO2 23 02/04/2014 0904   GLUCOSE 205* 02/04/2014 0904   BUN 34* 02/04/2014 0904   CREATININE 1.70* 02/04/2014 0904   CREATININE 1.99* 07/29/2013 1852   CALCIUM 9.6 02/04/2014 0904   GFRNONAA 31* 02/04/2014 0904   GFRNONAA 25* 07/29/2013 1852   GFRAA 35* 02/04/2014 0904   GFRAA 29* 07/29/2013 1852   Lab Results  Component Value Date   INR 1.08 11/30/2011   No results found for this basename: PTT    SOCIAL HISTORY: History   Social History  . Marital Status: Married    Spouse Name: N/A    Number of Children: 54  . Years of Education: N/A   Occupational History  . Employed    Social History Main Topics  . Smoking status: Never Smoker   . Smokeless tobacco: Never Used  . Alcohol Use: No  . Drug Use: No  . Sexual Activity: No   Other Topics Concern  . Not on file   Social History Narrative  . No narrative on file    FAMILY HISTORY: Family History  Problem Relation Age of Onset  . Heart disease Mother   . Heart attack Mother   . Diabetes Mother   . Hypertension Mother   . Colon cancer Father   . Breast cancer Sister   . Diabetes Brother   . Hypertension Brother   . Stroke Brother      REVIEW OF SYSTEMS: Reviewed with patient and positive as above.   DENTAL HISTORY: CHIEF COMPLAINT: The patient referred by Dr. Darylene Price for a  dental consultation.  HPI: [name] [name] is a 68 year old female recently diagnosed with severe aortic stenosis. Patient with anticipated aortic valve replacement with Dr. Roxy Manns. Patient is now seen as part of a medically necessary pre-heart valve surgery dental protocol examination.  Patient currently denies any acute toothache, swellings, or abscesses. Patient has not been seen by a dentist for " a long, long time" . Patient had multiple "back teeth pulled" at that time by report. Patient cannot  remember the name of that dentist. Patient denies having any dental complications from those dental extractions. Patient knows that she has multiple" bad teeth ". Patient denies having any partial dentures.  DENTAL EXAMINATION: GENERAL: The patient is a well-developed, well-nourished female in no acute distress. HEAD AND NECK: There is no palpable submandibular lymphadenopathy. The patient denies having any acute TMJ symptoms. INTRAORAL EXAM: The patient has normal saliva. Patient has bilateral mandibular lingual tori. There is no intraoral evidence of intraaoral abscess formation. There is atrophy of the edentulous alveolar ridges. DENTITION: The patient is missing tooth numbers 1, 3, 13, 14, 15, 16, 17, 18, 19, 20, 21, 27, 29, 30, 31, and 32. The patient has tooth numbers 4 and 5 remaining along with retained root segments numbers 2, 6, 7, 8, 9, 10, 11, 12, 22, 23, 24, 25, 26, and 28. PERIODONTAL: Patient has chronic periodontitis with plaque and calculus relations, gingival recession, and tooth mobility. There is moderate bone loss noted. DENTAL CARIES/SUBOPTIMAL RESTORATIONS: There are multiple dental caries affecting all remaining teeth. ENDODONTIC: Patient currently denies acute pulpitis symptoms. Patient has multiple areas of periapical pathology and radiolucency. CROWN AND BRIDGE: There are no crown or bridge restorations. PROSTHODONTIC: Patient has no partial dentures. OCCLUSION: The patient has  a poor occlusal scheme secondary to multiple missing teeth, multiple retained root segments, and lack of replacement of missing teeth with dental prostheses.  RADIOGRAPHIC INTERPRETATION: An orthopantogram was obtained and supplemented with 5 upper periapical radiographs. We were unable to obtain lower. Radius secondary to the mandibular tori and lack of patient cooperation. There are multiple missing teeth. There are multiple retained root segments. There are multiple areas of periapical pathology and radiolucency. There is moderate to severe bone loss. There is supra-eruption and drifting of the unopposed teeth into the edentulous areas.  ASSESSMENTS: 1. Severe aortic stenosis 2. Nonischemic cardiomyopathy with ejection fraction of approximately 15% per recent echocardiogram 3. Chronic apical periodontitis 4. Multiple retained root segments 5. Multiple dental caries 6. Chronic periodontitis 7. Accretions 8. Gingival recession 9. Tooth mobility 10. Bilateral mandibular lingual tori 11. No history of partial dentures 12. Multiple missing teeth 13. 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 Significant cardiovascular compromise with risk for complications up to death with anticipated invasive dental procedures in the operating room.   PLAN/RECOMMENDATIONS: 1. I discussed the risks, benefits, and complications of various treatment options with the patient in relationship to her medical and dental conditions, cardiovascular compromise, and anticipated aortic valve replacement. We discussed various treatment options to include no treatment, multiple extractions with alveoloplasty, pre-prosthetic surgery as indicated, periodontal therapy, dental restorations, root canal therapy, crown and bridge therapy, implant therapy, and replacement of missing teeth as indicated. The patient currently wishes to proceed with extraction of remaining teeth with alveoloplasty and pre-prosthetic surgery as indicated in  the operating room.  Although ideally the patient like to be put to sleep, the patient is aware that due to her cardiovascular compromise, the dental procedures will most likely be done under monitored anesthesia care in the operating room.  The patient will then follow the dentist of her choice for fabrication of upper and lower complete dentures after adequate healing.  2. Discussion of findings with medical team and coordination of future medical and dental care as needed.  I spent 75 minutes face to face with patient and more than 50% of time was spent in counseling and /or coordination of care.   [name], DDS

## 2014-02-19 NOTE — Patient Instructions (Signed)
Patient agrees to proceed with operating procedure to remove all remaining teeth with alveoloplasty and pre-prosthetic surgery as indicated. The patient will then follow the dentist of her choice for fabrication of upper lower complete denture after adequate healing. Patient is aware of the potential complications up to and including death due to her overall cardiovascular compromise. Dr. Enrique Sack

## 2014-02-20 ENCOUNTER — Other Ambulatory Visit (HOSPITAL_COMMUNITY): Payer: Self-pay | Admitting: Dentistry

## 2014-02-21 ENCOUNTER — Encounter (HOSPITAL_COMMUNITY)
Admission: RE | Admit: 2014-02-21 | Discharge: 2014-02-21 | Disposition: A | Discharge status: Home / Self Care | Payer: Medicare Other | Source: Ambulatory Visit | Attending: Dentistry | Admitting: Dentistry

## 2014-02-21 ENCOUNTER — Encounter (HOSPITAL_COMMUNITY)
Admission: RE | Admit: 2014-02-21 | Discharge: 2014-02-21 | Disposition: A | Discharge status: Home / Self Care | Payer: Medicare Other | Source: Ambulatory Visit | Attending: Anesthesiology | Admitting: Anesthesiology

## 2014-02-21 ENCOUNTER — Encounter (HOSPITAL_COMMUNITY): Payer: Self-pay

## 2014-02-21 DIAGNOSIS — Z01818 Encounter for other preprocedural examination: Secondary | ICD-10-CM | POA: Insufficient documentation

## 2014-02-21 DIAGNOSIS — Z01812 Encounter for preprocedural laboratory examination: Secondary | ICD-10-CM | POA: Insufficient documentation

## 2014-02-21 HISTORY — DX: Constipation, unspecified: K59.00

## 2014-02-21 HISTORY — DX: Depression, unspecified: F32.A

## 2014-02-21 HISTORY — DX: Major depressive disorder, single episode, unspecified: F32.9

## 2014-02-21 LAB — CBC
HCT: 33.2 % — ABNORMAL LOW (ref 36.0–46.0)
Hemoglobin: 10.4 g/dL — ABNORMAL LOW (ref 12.0–15.0)
MCH: 26 pg (ref 26.0–34.0)
MCHC: 31.3 g/dL (ref 30.0–36.0)
MCV: 83 fL (ref 78.0–100.0)
Platelets: 236 10*3/uL (ref 150–400)
RBC: 4 MIL/uL (ref 3.87–5.11)
RDW: 16.8 % — ABNORMAL HIGH (ref 11.5–15.5)
WBC: 5 10*3/uL (ref 4.0–10.5)

## 2014-02-21 LAB — BASIC METABOLIC PANEL
Anion gap: 14 (ref 5–15)
BUN: 27 mg/dL — ABNORMAL HIGH (ref 6–23)
CO2: 25 mEq/L (ref 19–32)
Calcium: 9.9 mg/dL (ref 8.4–10.5)
Chloride: 103 mEq/L (ref 96–112)
Creatinine, Ser: 1.73 mg/dL — ABNORMAL HIGH (ref 0.50–1.10)
GFR calc Af Amer: 34 mL/min — ABNORMAL LOW (ref >90)
GFR calc non Af Amer: 29 mL/min — ABNORMAL LOW (ref >90)
Glucose, Bld: 164 mg/dL — ABNORMAL HIGH (ref 70–99)
Potassium: 4.1 mEq/L (ref 3.7–5.3)
Sodium: 142 mEq/L (ref 137–147)

## 2014-02-21 LAB — SURGICAL PCR SCREEN
MRSA, PCR: NEGATIVE
Staphylococcus aureus: POSITIVE — AB

## 2014-02-21 NOTE — Progress Notes (Signed)
I called a prescription in to [facility] in [place] for [place].

## 2014-02-21 NOTE — Pre-Procedure Instructions (Signed)
AMBERT FALOR  02/21/2014   Your procedure is scheduled on:  Thursday, July16.  Report to [facility] Admitting at 5:30AM.  Call this number if you have problems the morning of surgery: [phone]   Remember:   Do not eat food or drink liquids after midnight Wednesday, July 15.   Take these medicines the morning of surgery with A SIP OF WATER: carvedilol (COREG).    Do not wear jewelry, make-up or nail polish.  Do not wear lotions, powders, or perfumes.  Do not shave 48 hours prior to surgery.  Do not bring valuables to the hospital.              [facility] is not responsible  for any belongings or valuables.               Contacts, dentures or bridgework may not be worn into surgery.  Leave suitcase in the car. After surgery it may be brought to your room.  For patients admitted to the hospital, discharge time is determined by your  treatment team.               Patients discharged the day of surgery will not be allowed to drive home.  Name and phone number of your driver: -   Special Instructions: Review   - Preparing For Surgery.   Please read over the following fact sheets that you were given: Pain Booklet, Coughing and Deep Breathing and Surgical Site Infection Prevention

## 2014-02-21 NOTE — Progress Notes (Signed)
Mrs Saintfleur checks her blood sugar every am, reports it runs between 105 and 200.

## 2014-02-24 NOTE — Progress Notes (Signed)
Anesthesia chart review: Patient is a 68 year old female scheduled for multiple teeth extractions with alveoloplasty and pre-prosthetic surgery is indicated on 02/27/14 by Dr. Enrique Sack. Case is posted for GA, but Dr. Ritta Slot note mentions likely only MAC due to her CV co-morbidities. Patient was referred by CT surgeon Dr. Darylene Price in anticipation for TAVR vs AVR in the near future.  History includes severe AS, left BBB, chronic combined CHF, non-ischemic CM (has seen [name], but declined AICD), HTN, DM2, HLD, anxiety, depression, symptomatic PVCs, CKD stage II, non-smoker. PCP is Dr. [name].  Cardiologist is Dr. Rozann Lesches.    Coronary CTA on 02/10/14 showed: Coronary Arteries: The study not intended for coronary evaluation and without use of NTG. There is significant motion in the RCA. Normal origin of coronary arteries. Right dominance. Mild calcified plague in the mid RCA. Otherwise normal coronaries. Mid to distal LAD is not visualized.  Optimum Fluoroscopic Angle for Delivery: RAO 0, CRA 5.  IMPRESSION: 1. Aortic valve measurements optimal for delivery of 26 mm Edward - Sapien XT THV.  2. Sufficient annulus to coronary distance for delivery of 26 mm Edward - Sapien XT THV.  3. 7 x 4 mm left lower lobe pulmonary nodule. If the patient is at high risk for bronchogenic carcinoma, follow-up chest CT at 3-21months is recommended. If the patient is at low risk for bronchogenic carcinoma, follow-up chest CT at 6-12 months is recommended. 4. There appears to be a calcified broncholith within the distal bronchus intermedius and proximal right middle lobe bronchus which is presumably obstructive, as there is what appears to be chronic collapse of the right middle lobe. Nonemergent bronchoscopic correlation may be warranted if clinically indicated. Report is marked as reviewed by Dr. Roxy Manns. He is scheduled to see her again on 03/03/14.  Stress echo on 12/23/13 showed:  - Dobutamine  echocardiogram to assess severity of AS. Note baseline LV function appeared to be severely reduced on parasternal images but no further imaging of LV function performed. Baseline images reveal peak velocity of 3.75 m/s with mean gradient of 31 mmHg and AVA of 0.4 cm2; with dobutamine at 20 ug, peak velocity increased to 4.2 m/s and mean gradient of 38 mmHg with AVA 0.4 cm2. Findings consistent with severe AS.  Echo on 11/20/13 showed: - Left ventricle: The cavity size was mildly dilated. Wall thickness was normal. The estimated ejection fraction was 15%. Diffuse hypokinesis. Doppler parameters are consistent with abnormal left ventricular relaxation (grade 1 diastolic dysfunction). - Aortic valve: Moderately to severely calcified annulus. Severely thickened leaflets. There was severe stenosis by continuity equation with moderately elevated gradient consistent with severe low flow low gradient aortic stenosis in the setting of severe LV systolic dysfunction. Mild to moderate regurgitation. Valve area: 0.67cm^2(VTI). Valve area: 0.62cm^2 (Vmax). Regurgitation pressure half-time: 425ms. - Mitral valve: Mildly to moderately calcified annulus. Mildly thickened leaflets . Mild regurgitation. - Left atrium: The atrium was moderately dilated. - Right atrium: The atrium was mildly dilated. - Pulmonary arteries: PA peak pressure: 32mm Hg (S). PASP is borderline elevated. (Prior EF was 20-25% on 11/07/11 and 02/09/12.)  Cardiac cath on 12/05/11 showed:  1. No angiographic evidence of CAD. 2. Severe LV systolic dysfunction secondary to non-ischemic cardiomyopathy. LVEF 20-25%. 3. Normal filling pressures.   EKG on 11/14/13 showed: NSR, RAE, left BBB.  CXR on 02/21/14 showed: No active cardiopulmonary disease. Unchanged RML atelectasis compared to prior CT. 7 mm LEFT lower lobe pulmonary nodule again noted.  Preoperative labs  noted.  BUN/Cr 27/1.73 which appears within her baseline.  Glucose 164.  H/H  10.4/33.2.  Patient with severe AS and non-ischemic CM with need for dental surgery prior to undergoing TAVR or AVR.  She has refused ICD in the past.  She will be further evaluated by her assigned anesthesiologist preoperative to ensure no acute change/decompensation but if stable then would anticipate that she could proceed.  Definitive anesthesia plan to be determined by her assigned anesthesiologist.  Myra Gianotti, PA-C [facility] Short Stay Center/Anesthesiology Phone [phone] 02/24/2014 9:58 AM

## 2014-02-26 ENCOUNTER — Ambulatory Visit: Payer: Medicare Other | Admitting: Cardiology

## 2014-02-26 MED ORDER — SODIUM BICARBONATE 8.4 % IV SOLN: INTRAVENOUS | Status: DC

## 2014-02-26 MED ORDER — CLINDAMYCIN PHOSPHATE 900 MG/50ML IV SOLN
900.0000 mg | INTRAVENOUS | Status: AC
  Administered 2014-02-27: 900 mg via INTRAVENOUS
  Filled 2014-02-26: qty 50

## 2014-02-26 MED ORDER — SODIUM BICARBONATE BOLUS VIA INFUSION: INTRAVENOUS | Status: DC

## 2014-02-27 ENCOUNTER — Ambulatory Visit (HOSPITAL_COMMUNITY): Payer: Medicare Other | Admitting: Anesthesiology

## 2014-02-27 ENCOUNTER — Encounter (HOSPITAL_COMMUNITY): Payer: Self-pay | Admitting: Anesthesiology

## 2014-02-27 ENCOUNTER — Encounter (HOSPITAL_COMMUNITY)
Admission: RE | Disposition: A | Discharge status: Home / Self Care | Payer: Self-pay | Source: Ambulatory Visit | Attending: Cardiology

## 2014-02-27 ENCOUNTER — Encounter (HOSPITAL_COMMUNITY): Payer: Medicare Other | Admitting: Vascular Surgery

## 2014-02-27 ENCOUNTER — Observation Stay (HOSPITAL_COMMUNITY)
Admission: RE | Admit: 2014-02-27 | Discharge: 2014-02-28 | Disposition: A | Discharge status: Home / Self Care | Payer: Medicare Other | Source: Ambulatory Visit | Attending: Interventional Cardiology | Admitting: Interventional Cardiology

## 2014-02-27 DIAGNOSIS — Z8249 Family history of ischemic heart disease and other diseases of the circulatory system: Secondary | ICD-10-CM | POA: Insufficient documentation

## 2014-02-27 DIAGNOSIS — M278 Other specified diseases of jaws: Secondary | ICD-10-CM | POA: Insufficient documentation

## 2014-02-27 DIAGNOSIS — E663 Overweight: Secondary | ICD-10-CM | POA: Diagnosis present

## 2014-02-27 DIAGNOSIS — I429 Cardiomyopathy, unspecified: Secondary | ICD-10-CM | POA: Diagnosis present

## 2014-02-27 DIAGNOSIS — N183 Chronic kidney disease, stage 3 unspecified: Secondary | ICD-10-CM

## 2014-02-27 DIAGNOSIS — I5022 Chronic systolic (congestive) heart failure: Secondary | ICD-10-CM

## 2014-02-27 DIAGNOSIS — K0530 Chronic periodontitis, unspecified: Secondary | ICD-10-CM

## 2014-02-27 DIAGNOSIS — E1121 Type 2 diabetes mellitus with diabetic nephropathy: Secondary | ICD-10-CM | POA: Diagnosis present

## 2014-02-27 DIAGNOSIS — Z6826 Body mass index (BMI) 26.0-26.9, adult: Secondary | ICD-10-CM | POA: Diagnosis present

## 2014-02-27 DIAGNOSIS — I10 Essential (primary) hypertension: Secondary | ICD-10-CM | POA: Diagnosis present

## 2014-02-27 DIAGNOSIS — K083 Retained dental root: Secondary | ICD-10-CM | POA: Insufficient documentation

## 2014-02-27 DIAGNOSIS — E785 Hyperlipidemia, unspecified: Secondary | ICD-10-CM | POA: Insufficient documentation

## 2014-02-27 DIAGNOSIS — K029 Dental caries, unspecified: Principal | ICD-10-CM | POA: Insufficient documentation

## 2014-02-27 DIAGNOSIS — G4700 Insomnia, unspecified: Secondary | ICD-10-CM | POA: Diagnosis present

## 2014-02-27 DIAGNOSIS — I359 Nonrheumatic aortic valve disorder, unspecified: Secondary | ICD-10-CM | POA: Insufficient documentation

## 2014-02-27 DIAGNOSIS — I129 Hypertensive chronic kidney disease with stage 1 through stage 4 chronic kidney disease, or unspecified chronic kidney disease: Secondary | ICD-10-CM | POA: Insufficient documentation

## 2014-02-27 DIAGNOSIS — M129 Arthropathy, unspecified: Secondary | ICD-10-CM | POA: Insufficient documentation

## 2014-02-27 DIAGNOSIS — I350 Nonrheumatic aortic (valve) stenosis: Secondary | ICD-10-CM | POA: Diagnosis present

## 2014-02-27 DIAGNOSIS — E1122 Type 2 diabetes mellitus with diabetic chronic kidney disease: Secondary | ICD-10-CM | POA: Diagnosis present

## 2014-02-27 DIAGNOSIS — I5042 Chronic combined systolic (congestive) and diastolic (congestive) heart failure: Secondary | ICD-10-CM | POA: Insufficient documentation

## 2014-02-27 DIAGNOSIS — E669 Obesity, unspecified: Secondary | ICD-10-CM | POA: Diagnosis present

## 2014-02-27 DIAGNOSIS — F411 Generalized anxiety disorder: Secondary | ICD-10-CM | POA: Insufficient documentation

## 2014-02-27 DIAGNOSIS — I428 Other cardiomyopathies: Secondary | ICD-10-CM | POA: Insufficient documentation

## 2014-02-27 DIAGNOSIS — K045 Chronic apical periodontitis: Secondary | ICD-10-CM | POA: Insufficient documentation

## 2014-02-27 DIAGNOSIS — I509 Heart failure, unspecified: Secondary | ICD-10-CM | POA: Insufficient documentation

## 2014-02-27 DIAGNOSIS — I447 Left bundle-branch block, unspecified: Secondary | ICD-10-CM | POA: Insufficient documentation

## 2014-02-27 DIAGNOSIS — N184 Chronic kidney disease, stage 4 (severe): Secondary | ICD-10-CM

## 2014-02-27 DIAGNOSIS — E119 Type 2 diabetes mellitus without complications: Secondary | ICD-10-CM | POA: Insufficient documentation

## 2014-02-27 DIAGNOSIS — Z794 Long term (current) use of insulin: Secondary | ICD-10-CM | POA: Insufficient documentation

## 2014-02-27 HISTORY — PX: MULTIPLE EXTRACTIONS WITH ALVEOLOPLASTY: SHX5342

## 2014-02-27 LAB — GLUCOSE, CAPILLARY
Glucose-Capillary: 196 mg/dL — ABNORMAL HIGH (ref 70–99)
Glucose-Capillary: 152 mg/dL — ABNORMAL HIGH (ref 70–99)
Glucose-Capillary: 160 mg/dL — ABNORMAL HIGH (ref 70–99)

## 2014-02-27 SURGERY — MULTIPLE EXTRACTION WITH ALVEOLOPLASTY
Anesthesia: General | Site: Mouth

## 2014-02-27 MED ORDER — LIDOCAINE-EPINEPHRINE 2 %-1:100000 IJ SOLN
INTRAMUSCULAR | Status: DC | PRN
  Administered 2014-02-27 (×6): 1.7 mL via INTRADERMAL

## 2014-02-27 MED ORDER — AMINOCAPROIC ACID SOLUTION 5% (50 MG/ML)
10.0000 mL | ORAL | Status: AC
  Administered 2014-02-27 (×9): 10 mL via ORAL
  Filled 2014-02-27 (×3): qty 100

## 2014-02-27 MED ORDER — PHENYLEPHRINE 40 MCG/ML (10ML) SYRINGE FOR IV PUSH (FOR BLOOD PRESSURE SUPPORT)
PREFILLED_SYRINGE | INTRAVENOUS | Status: AC
  Filled 2014-02-27: qty 10

## 2014-02-27 MED ORDER — 0.9 % SODIUM CHLORIDE (POUR BTL) OPTIME
TOPICAL | Status: DC | PRN
  Administered 2014-02-27: 1000 mL

## 2014-02-27 MED ORDER — ONDANSETRON HCL 4 MG/2ML IJ SOLN
INTRAMUSCULAR | Status: AC
Start: 2014-02-27 — End: 2014-02-27
  Filled 2014-02-27: qty 2

## 2014-02-27 MED ORDER — FUROSEMIDE 40 MG PO TABS
40.0000 mg | ORAL_TABLET | Freq: Every day | ORAL | Status: DC
  Administered 2014-02-27 – 2014-02-28 (×2): 40 mg via ORAL
  Filled 2014-02-27 (×3): qty 1

## 2014-02-27 MED ORDER — HYDROMORPHONE HCL PF 1 MG/ML IJ SOLN: 0.2500 mg | INTRAMUSCULAR | Status: DC | PRN

## 2014-02-27 MED ORDER — LIDOCAINE-EPINEPHRINE 2 %-1:100000 IJ SOLN
INTRAMUSCULAR | Status: AC
  Filled 2014-02-27: qty 10.2

## 2014-02-27 MED ORDER — ALCOHOL (RUBBING) 70 % SOLN
Status: DC | PRN
  Administered 2014-02-27: 100 mL via TOPICAL

## 2014-02-27 MED ORDER — BUPIVACAINE-EPINEPHRINE 0.5% -1:200000 IJ SOLN
INTRAMUSCULAR | Status: DC | PRN
  Administered 2014-02-27 (×2): 1.8 mL

## 2014-02-27 MED ORDER — LACTATED RINGERS IV SOLN
INTRAVENOUS | Status: DC | PRN
  Administered 2014-02-27: 07:00:00 via INTRAVENOUS

## 2014-02-27 MED ORDER — WATER FOR IRRIGATION, STERILE IR SOLN
Status: DC | PRN
  Administered 2014-02-27: 1000 mL

## 2014-02-27 MED ORDER — NEOSTIGMINE METHYLSULFATE 10 MG/10ML IV SOLN
INTRAVENOUS | Status: DC | PRN
  Administered 2014-02-27: 4 mg via INTRAVENOUS

## 2014-02-27 MED ORDER — ONDANSETRON HCL 4 MG PO TABS: 4.0000 mg | ORAL_TABLET | Freq: Three times a day (TID) | ORAL | Status: DC | PRN

## 2014-02-27 MED ORDER — GLYCOPYRROLATE 0.2 MG/ML IJ SOLN
INTRAMUSCULAR | Status: AC
  Filled 2014-02-27: qty 1

## 2014-02-27 MED ORDER — OXYMETAZOLINE HCL 0.05 % NA SOLN
NASAL | Status: AC
  Filled 2014-02-27: qty 15

## 2014-02-27 MED ORDER — PROPOFOL 10 MG/ML IV BOLUS
INTRAVENOUS | Status: DC | PRN
  Administered 2014-02-27: 100 mg via INTRAVENOUS

## 2014-02-27 MED ORDER — PHENYLEPHRINE HCL 10 MG/ML IJ SOLN
10.0000 mg | INTRAVENOUS | Status: DC | PRN
  Administered 2014-02-27: 25 ug/min via INTRAVENOUS

## 2014-02-27 MED ORDER — ROCURONIUM BROMIDE 100 MG/10ML IV SOLN
INTRAVENOUS | Status: DC | PRN
  Administered 2014-02-27: 30 mg via INTRAVENOUS

## 2014-02-27 MED ORDER — ONDANSETRON HCL 4 MG/2ML IJ SOLN: 4.0000 mg | Freq: Once | INTRAMUSCULAR | Status: DC | PRN

## 2014-02-27 MED ORDER — FENTANYL CITRATE 0.05 MG/ML IJ SOLN
INTRAMUSCULAR | Status: AC
  Filled 2014-02-27: qty 5

## 2014-02-27 MED ORDER — ONDANSETRON HCL 4 MG/2ML IJ SOLN
INTRAMUSCULAR | Status: DC | PRN
  Administered 2014-02-27: 4 mg via INTRAVENOUS

## 2014-02-27 MED ORDER — MORPHINE SULFATE 2 MG/ML IJ SOLN: 2.0000 mg | INTRAMUSCULAR | Status: DC | PRN

## 2014-02-27 MED ORDER — ONDANSETRON HCL 4 MG/2ML IJ SOLN
4.0000 mg | Freq: Three times a day (TID) | INTRAMUSCULAR | Status: DC | PRN
  Administered 2014-02-27: 4 mg via INTRAVENOUS
  Filled 2014-02-27: qty 2

## 2014-02-27 MED ORDER — ASPIRIN EC 81 MG PO TBEC
81.0000 mg | DELAYED_RELEASE_TABLET | Freq: Every day | ORAL | Status: DC
  Administered 2014-02-28: 81 mg via ORAL
  Filled 2014-02-27: qty 1

## 2014-02-27 MED ORDER — HYDROCODONE-ACETAMINOPHEN 7.5-325 MG/15ML PO SOLN
10.0000 mL | ORAL | Status: DC | PRN
Start: 2014-02-27 — End: 2014-02-28
  Administered 2014-02-27 – 2014-02-28 (×2): 15 mL via ORAL
  Filled 2014-02-27 (×2): qty 15

## 2014-02-27 MED ORDER — CARVEDILOL 25 MG PO TABS
25.0000 mg | ORAL_TABLET | Freq: Two times a day (BID) | ORAL | Status: DC
  Administered 2014-02-27 – 2014-02-28 (×2): 25 mg via ORAL
  Filled 2014-02-27 (×3): qty 1

## 2014-02-27 MED ORDER — BUPIVACAINE-EPINEPHRINE (PF) 0.5% -1:200000 IJ SOLN
INTRAMUSCULAR | Status: AC
  Filled 2014-02-27: qty 7.2

## 2014-02-27 MED ORDER — GLYCOPYRROLATE 0.2 MG/ML IJ SOLN
INTRAMUSCULAR | Status: DC | PRN
  Administered 2014-02-27: 0.6 mg via INTRAVENOUS
  Administered 2014-02-27: 0.2 mg via INTRAVENOUS

## 2014-02-27 MED ORDER — LIDOCAINE HCL (CARDIAC) 20 MG/ML IV SOLN
INTRAVENOUS | Status: DC | PRN
  Administered 2014-02-27: 60 mg via INTRATRACHEAL
  Administered 2014-02-27: 60 mg via INTRAVENOUS

## 2014-02-27 MED ORDER — FENTANYL CITRATE 0.05 MG/ML IJ SOLN
INTRAMUSCULAR | Status: DC | PRN
  Administered 2014-02-27: 75 ug via INTRAVENOUS
  Administered 2014-02-27: 25 ug via INTRAVENOUS

## 2014-02-27 MED ORDER — LACTATED RINGERS IV SOLN
INTRAVENOUS | Status: DC
  Administered 2014-02-27: 11:00:00 via INTRAVENOUS

## 2014-02-27 MED ORDER — GLYCOPYRROLATE 0.2 MG/ML IJ SOLN
INTRAMUSCULAR | Status: AC
  Filled 2014-02-27: qty 3

## 2014-02-27 MED ORDER — NEOSTIGMINE METHYLSULFATE 10 MG/10ML IV SOLN
INTRAVENOUS | Status: AC
  Filled 2014-02-27: qty 1

## 2014-02-27 MED ORDER — LIDOCAINE HCL (CARDIAC) 20 MG/ML IV SOLN
INTRAVENOUS | Status: AC
  Filled 2014-02-27: qty 5

## 2014-02-27 MED ORDER — INSULIN ASPART 100 UNIT/ML ~~LOC~~ SOLN
0.0000 [IU] | Freq: Three times a day (TID) | SUBCUTANEOUS | Status: DC
  Administered 2014-02-28: 3 [IU] via SUBCUTANEOUS
  Administered 2014-02-28: 2 [IU] via SUBCUTANEOUS

## 2014-02-27 SURGICAL SUPPLY — 32 items
ALCOHOL 70% 16 OZ (MISCELLANEOUS) ×2 IMPLANT
ATTRACTOMAT 16X20 MAGNETIC DRP (DRAPES) ×2 IMPLANT
BLADE SURG 15 STRL LF DISP TIS (BLADE) ×2 IMPLANT
BLADE SURG 15 STRL SS (BLADE) ×2
COVER SURGICAL LIGHT HANDLE (MISCELLANEOUS) ×2 IMPLANT
GAUZE PACKING FOLDED 2  STR (GAUZE/BANDAGES/DRESSINGS) ×1
GAUZE PACKING FOLDED 2 STR (GAUZE/BANDAGES/DRESSINGS) ×1 IMPLANT
GAUZE SPONGE 4X4 16PLY XRAY LF (GAUZE/BANDAGES/DRESSINGS) ×4 IMPLANT
GLOVE BIOGEL PI IND STRL 6 (GLOVE) ×1 IMPLANT
GLOVE BIOGEL PI INDICATOR 6 (GLOVE) ×1
GLOVE SURG ORTHO 8.0 STRL STRW (GLOVE) ×2 IMPLANT
GLOVE SURG SS PI 6.0 STRL IVOR (GLOVE) ×2 IMPLANT
GOWN STRL REUS W/ TWL LRG LVL3 (GOWN DISPOSABLE) ×1 IMPLANT
GOWN STRL REUS W/TWL 2XL LVL3 (GOWN DISPOSABLE) ×2 IMPLANT
GOWN STRL REUS W/TWL LRG LVL3 (GOWN DISPOSABLE) ×2
KIT BASIN OR (CUSTOM PROCEDURE TRAY) ×2 IMPLANT
KIT ROOM TURNOVER OR (KITS) ×2 IMPLANT
MANIFOLD NEPTUNE WASTE (CANNULA) ×2 IMPLANT
NEEDLE BLUNT 16X1.5 OR ONLY (NEEDLE) ×2 IMPLANT
NS IRRIG 1000ML POUR BTL (IV SOLUTION) ×2 IMPLANT
PACK EENT II TURBAN DRAPE (CUSTOM PROCEDURE TRAY) ×2 IMPLANT
PAD ARMBOARD 7.5X6 YLW CONV (MISCELLANEOUS) ×2 IMPLANT
SPONGE SURGIFOAM ABS GEL 100 (HEMOSTASIS) IMPLANT
SPONGE SURGIFOAM ABS GEL 12-7 (HEMOSTASIS) IMPLANT
SPONGE SURGIFOAM ABS GEL SZ50 (HEMOSTASIS) IMPLANT
SUCTION FRAZIER TIP 10 FR DISP (SUCTIONS) ×2 IMPLANT
SUT CHROMIC 3 0 PS 2 (SUTURE) ×8 IMPLANT
SUT CHROMIC 4 0 P 3 18 (SUTURE) ×2 IMPLANT
SYR 50ML SLIP (SYRINGE) ×2 IMPLANT
TOWEL OR 17X26 10 PK STRL BLUE (TOWEL DISPOSABLE) ×2 IMPLANT
TUBE CONNECTING 12X1/4 (SUCTIONS) ×2 IMPLANT
YANKAUER SUCT BULB TIP NO VENT (SUCTIONS) ×2 IMPLANT

## 2014-02-27 NOTE — H&P (Signed)
Cardiologist:  [name] [name] is an 68 y.o. female.   Chief Complaint:  SP dental work HPI:   [name] [name] is a 68 y.o.female seen by Dr. [name] for the first time in late April. History of HTN, HLD, AS, Cardiomyopathy- EF 20-25%, DM2, CKD stage 3, Chronic combined systolic and diastolic CHF, LBBB.  Her most recent echocardiograms demonstrate low gradient severe aortic stenosis with associated cardiomyopathy and LVEF of approximately 20%.  Dobutamine echocardiogram demonstrated baseline peak AV velocity 3.7 m/s with mean gradient 31 mm mercury, and at 20 mcg/kilogram/minute dobutamine infusion, peak AV velocity increased to 4.2 m/s with mean gradient 38 mm mercury, consistent with severe stenosis. Study did not comment on whether there was any change in LV function.  Cardiac catheterization from April 2013 showed no angiographic evidence of CAD with normal filling pressures.  Dr. Burt Knack to discuss possibility of TAVR.    She presented today and underwent preaortic valve replacement dental protocol.  She reports some dizziness this morning after waking but otherwise she denies nausea, vomiting, fever, chest pain, shortness of breath, orthopnea, PND, cough, congestion, abdominal pain, hematochezia, melena, lower extremity edema  Medications: Prior to Admission medications   Medication Sig Start Date End Date Taking? Authorizing Provider  acetaminophen (TYLENOL) 500 MG tablet Take 1,000 mg by mouth every 6 (six) hours as needed.   Yes Historical Provider, MD  aspirin EC 81 MG tablet Take 1 tablet (81 mg total) by mouth daily. 01/01/13  Yes Fayrene Helper, MD  atorvastatin (LIPITOR) 80 MG tablet TAKE ONE TABLET DAILY.   Yes Fayrene Helper, MD  atorvastatin (LIPITOR) 80 MG tablet Take 80 mg by mouth daily.   Yes Historical Provider, MD  carvedilol (COREG) 25 MG tablet Take 1 tablet (25 mg total) by mouth 2 (two) times daily. 11/20/13  Yes Lendon Colonel, NP  fenofibrate (TRICOR)  145 MG tablet Take 145 mg by mouth daily.   Yes Historical Provider, MD  furosemide (LASIX) 40 MG tablet Take 1 tablet (40 mg total) by mouth every morning. 11/22/13  Yes Lendon Colonel, NP  insulin NPH Human (HUMULIN N) 100 UNIT/ML injection Inject 10 Units into the skin 2 (two) times daily before a meal.   Yes Historical Provider, MD  Multiple Vitamin (MULITIVITAMIN WITH MINERALS) TABS Take 1 tablet by mouth every morning.   Yes Historical Provider, MD  niacin (NIASPAN) 1000 MG CR tablet Take 1,000 mg by mouth at bedtime.   Yes Historical Provider, MD  spironolactone (ALDACTONE) 25 MG tablet Take 1 tablet (25 mg total) by mouth every morning. 10/25/13  Yes Herminio Commons, MD     Past Medical History  Diagnosis Date  . Essential hypertension, benign   . Hyperlipidemia   . Anxiety   . Aortic stenosis   . History of pneumonia 10/2011  . Symptomatic PVCs   . Cardiomyopathy     EF of 20-25% on echo in 10/2011, has declined ICD  . Diabetes mellitus, type 2   . Arthritis   . Nonischemic cardiomyopathy 10/14/2011  . CKD (chronic kidney disease) stage 3, GFR 30-59 ml/min 05/21/2013  . Diabetes mellitus, insulin dependent (IDDM), uncontrolled 10/24/2007    hBA1C is 8.1 in 11/2013   . Chronic systolic heart failure [id number]  . Obesity 10/24/2007  . Chronic combined systolic and diastolic CHF (congestive heart failure)   . Shortness of breath      with too much work-   . Heart murmur   .  LBBB (left bundle branch block) 12/03/2013  . Pneumonia 2013  . Depression   . Constipation     Past Surgical History  Procedure Laterality Date  . Tubal ligation    . Cesarean section      Family History  Problem Relation Age of Onset  . Heart disease Mother   . Heart attack Mother   . Diabetes Mother   . Hypertension Mother   . Colon cancer Father   . Breast cancer Sister   . Diabetes Brother   . Hypertension Brother   . Stroke Brother    Social History:  reports that she has never  smoked. She has never used smokeless tobacco. She reports that she does not drink alcohol or use illicit drugs.  Allergies:  Allergies  Allergen Reactions  . Penicillins     Family unsure of reaction, but certain mom said she was allergic    Medications Prior to Admission  Medication Sig Dispense Refill  . acetaminophen (TYLENOL) 500 MG tablet Take 1,000 mg by mouth every 6 (six) hours as needed.      Marland Kitchen aspirin EC 81 MG tablet Take 1 tablet (81 mg total) by mouth daily.  150 tablet  2  . atorvastatin (LIPITOR) 80 MG tablet TAKE ONE TABLET DAILY.  30 tablet  2  . atorvastatin (LIPITOR) 80 MG tablet Take 80 mg by mouth daily.      . carvedilol (COREG) 25 MG tablet Take 1 tablet (25 mg total) by mouth 2 (two) times daily.  60 tablet  6  . fenofibrate (TRICOR) 145 MG tablet Take 145 mg by mouth daily.      . furosemide (LASIX) 40 MG tablet Take 1 tablet (40 mg total) by mouth every morning.  30 tablet  6  . insulin NPH Human (HUMULIN N) 100 UNIT/ML injection Inject 10 Units into the skin 2 (two) times daily before a meal.      . Multiple Vitamin (MULITIVITAMIN WITH MINERALS) TABS Take 1 tablet by mouth every morning.      . niacin (NIASPAN) 1000 MG CR tablet Take 1,000 mg by mouth at bedtime.      Marland Kitchen spironolactone (ALDACTONE) 25 MG tablet Take 1 tablet (25 mg total) by mouth every morning.  90 tablet  3    No results found for this or any previous visit (from the past 48 hour(s)). No results found.  Review of Systems  Constitutional: Negative for fever and diaphoresis.  HENT: Negative for congestion and sore throat.   Respiratory: Negative for shortness of breath.   Cardiovascular: Negative for chest pain, orthopnea, leg swelling and PND.  Gastrointestinal: Negative for nausea, vomiting, abdominal pain, blood in stool and melena.  Genitourinary: Negative for hematuria.  Neurological: Positive for dizziness (This morning after waking).  All other systems reviewed and are  negative.   Blood pressure 98/86, pulse 80, temperature 97.3 F (36.3 C), temperature source Axillary, resp. rate 16, weight 178 lb (80.74 kg), SpO2 99.00%. Physical Exam  Nursing note and vitals reviewed. Constitutional: She is oriented to person, place, and time. She appears well-developed and well-nourished. No distress.  HENT:  Head: Normocephalic and atraumatic.  SP dental procedure  Eyes: EOM are normal. Pupils are equal, round, and reactive to light. No scleral icterus.  Neck: Normal range of motion. Neck supple.  Cardiovascular: Normal rate, regular rhythm, S1 normal and S2 normal.   Murmur heard.  Systolic murmur is present with a grade of 2/6  Pulses:  Radial pulses are 1+ on the right side, and 2+ on the left side.       Dorsalis pedis pulses are 2+ on the right side, and 2+ on the left side.  Respiratory: Effort normal and breath sounds normal. She has no wheezes. She has no rales.  GI: Soft. Bowel sounds are normal. She exhibits no distension. There is no tenderness.  Musculoskeletal: She exhibits no edema.  Neurological: She is alert and oriented to person, place, and time. She exhibits normal muscle tone.  Skin: Skin is warm and dry.  Psychiatric: She has a normal mood and affect.     Assessment/Plan Dental procedure: Extraction of remaining teeth.    Aortic stenosis  Severe, relative low gradient aortic stenosis in the setting of cardiomyopathy.  Being worked up for possible TAVR procedure.    Nonischemic cardiomyopathy  LVEF approximately 20%. Cardiac catheterization in April 2013 showed no angiographic evidence of obstructive CAD.  CKD (chronic kidney disease) stage 3, GFR 30-59 ml/min  Creatinine 1.73 on 02/21/14  LBBB (left bundle branch block)  Long-standing. Patient has had previous EP evaluation by [name] and declined ICD therapies. Chronic combined systolic and diastolic CHF  Appears euvolemic.   DM   CBG ACHS.  SS insulin.   Tarri Fuller,  PA-C 02/27/2014, 11:24 AM   I have examined the patient and reviewed assessment and plan and discussed with patient.  Agree with above as stated.  Severe AS.  F/u scheduled with Dr. Roxy Manns. Watch BP.  Judiciouds use of IV fluid given LV dysfunction.  CRI as well.  Orchid Glassberg S.

## 2014-02-27 NOTE — Transfer of Care (Signed)
Immediate Anesthesia Transfer of Care Note  Patient: [name] [name]  Procedure(s) Performed: Procedure(s): Extraction of tooth #'s 2,4,5,6,7,8,9,10,11,12, 22, 23, 24, 25, 26, 28 with alveoloplasty and bilateral mandibular tori reductions (N/A)  Patient Location: PACU  Anesthesia Type:General  Level of Consciousness: awake and alert   Airway & Oxygen Therapy: Patient Spontanous Breathing and Patient connected to nasal cannula oxygen  Post-op Assessment: Report given to PACU RN and Post -op Vital signs reviewed and stable  Post vital signs: Reviewed and stable  Complications: No apparent anesthesia complications

## 2014-02-27 NOTE — Anesthesia Preprocedure Evaluation (Addendum)
Anesthesia Evaluation  Patient identified by MRN, date of birth, ID band Patient awake    Reviewed: Allergy & Precautions, H&P , NPO status , Patient's Chart, lab work & pertinent test results  Airway       Dental   Pulmonary          Cardiovascular hypertension, +CHF + Valvular Problems/Murmurs AS     Neuro/Psych    GI/Hepatic   Endo/Other  diabetes, Type 2, Insulin DependentMorbid obesity  Renal/GU CRFRenal disease     Musculoskeletal   Abdominal   Peds  Hematology   Anesthesia Other Findings   Reproductive/Obstetrics                          Anesthesia Physical Anesthesia Plan  ASA: IV  Anesthesia Plan: General   Post-op Pain Management:    Induction: Intravenous  Airway Management Planned: Nasal ETT and Oral ETT  Additional Equipment:   Intra-op Plan:   Post-operative Plan: Extubation in OR  Informed Consent: I have reviewed the patients History and Physical, chart, labs and discussed the procedure including the risks, benefits and alternatives for the proposed anesthesia with the patient or authorized representative who has indicated his/her understanding and acceptance.     Plan Discussed with: Anesthesiologist, Surgeon and CRNA  Anesthesia Plan Comments:         Anesthesia Quick Evaluation

## 2014-02-27 NOTE — Op Note (Signed)
OPERATIVE REPORT  Patient:            [name] [name] Date of Birth:  [birth year] MRN:                [id number]   DATE OF PROCEDURE:  02/27/2014  PREOPERATIVE DIAGNOSES: 1. Aortic stenosis 2. Preaortic valve replacement dental protocol 3. Cardiomyopathy 4. Chronic apical periodontitis 5. Chronic periodontitis 6. Multiple retained root segments 7. Multiple dental caries 8. Bilateral mandibular lingual tori  POSTOPERATIVE DIAGNOSES: 1. Aortic stenosis 2. Preaortic valve replacement dental protocol 3. Cardiomyopathy 4. Chronic apical periodontitis 5. Chronic periodontitis 6. Multiple retained root segments 7. Multiple dental caries 8. Bilateral mandibular lingual tori  OPERATIONS: 1. Multiple extraction of tooth numbers 2, 4, 5, 6, 7, 8, 9, 10, 11, 12, 22, 23, 24, 25, 26, and 28 2. 4 Quadrants of alveoloplasty 3. Bilateral mandibular lingual tori reductions   SURGEON: [name], DDS  ASSISTANT: Camie Patience, (dental assistant)  ANESTHESIA: General anesthesia via oral endotracheal tube.  MEDICATIONS: 1. Clindamycin 600 mg IV prior to invasive dental procedures. 2. Local anesthesia with a total utilization of 6 carpules each containing 34 mg of lidocaine with 0.017 mg of epinephrine as well as 2 carpules each containing 9 mg of bupivacaine with 0.009 mg of epinephrine.  SPECIMENS: There are 16 teeth that were discarded.  DRAINS: None  CULTURES: None  COMPLICATIONS: None   ESTIMATED BLOOD LOSS: 100 mLs.  INTRAVENOUS FLUIDS: 700 mLs of Lactated ringers solution.  INDICATIONS: The patient was recently diagnosed with severe aortic stenosis.  A dental consultation was then requested to evaluate poor dentition.  The patient was examined and treatment planned for extraction remaining teeth with alveoloplasty and pre-prosthetic surgery as indicated in the operating room with general anesthesia.  This treatment plan was formulated to decrease the risks and  complications associated with dental infection from affecting the patient's systemic health and anticipated aortic valve replacement.  OPERATIVE FINDINGS: Patient was examined operating room number 9.  The teeth were identified for extraction. The patient was noted be affected by chronic periodontitis, chronic apical periodontitis, dental caries, multiple retained root segments, and presence of bilateral mandibular lingual tori.  DESCRIPTION OF PROCEDURE: Patient was brought to the main operating room number 9. Patient was then placed in the supine position on the operating table. General anesthesia was then induced per the anesthesia team. The patient was then prepped and draped in the usual manner for dental medicine procedure. A timeout was performed. The patient was identified and procedures were verified. A throat pack was placed at this time. The oral cavity was then thoroughly examined with the findings noted above. The patient was then ready for dental medicine procedure as follows:  Local anesthesia was then administered sequentially with a total utilization of 6 carpules each containing 34 mg of lidocaine with 0.017 mg of epinephrine as well as 2 carpules  each containing 9 mg bupivacaine with 0.009 mg of epinephrine.  The Maxillary left and right quadrants first approached. Anesthesia was then delivered utilizing infiltration with lidocaine with epinephrine. A #15 blade incision was then made from the maxillary right tuberosity and extended to the distal of #14.  A  surgical flap was then carefully reflected. The teeth were then subluxated with a series of straight elevators. Tooth numbers 2, 4, 6, 7, 8, 9, 10, 11, and 12 were then removed with a 150 forceps or rongeur without complications. Alveoloplasty was then performed utilizing a ronguers and bone file.  The surgical site was then irrigated with copious amounts of sterile saline. The tissues were approximated and trimmed appropriately. The  surgical site was then closed from the maxillary right tuberosity and extended the mesial #8 utilizing 3-0 chromic gut suture in a continuous surface suture technique x1. The maxillary left surgical site was then closed from the distal of #14 extended the mesial #9 utilizing 3-0 chromic gut suture in a continuous interrupted suture technique x1.  At this point time, the mandibular quadrants were approached. The patient was given bilateral inferior alveolar nerve blocks and long buccal nerve blocks utilizing the bupivacaine with epinephrine. Further infiltration was then achieved utilizing the lidocaine with epinephrine. A 15 blade incision was then made from the distal of number 19 and extended to the distal of #30.  A surgical flap was then carefully reflected.  Tooth numbers  22, 23, 24, 25, 26, and 28 were then subluxated and removed with a 151 forceps or rongeur without complications. Alveoloplasty was then performed utilizing a rongeurs and bone file. The tissues were approximated and trimmed appropriately. At this point time the bilateral mandibular lingual tori were visualized and removed with a surgical handpiece and bur and copious of sterile saline. Further alveoloplasty was performed utilizing a rongeur and bone file. The surgical sites were then irrigated with copious amounts of sterile saline x4. The mandibular left surgical site was then closed from the distal of  19 and extended the mesial numbers 25 utilizing 3-0 chromic gut suture in a continuous interrupted suture technique x1. The mandibular right surgical site was then closed from the distal of #30 and extended to the mesial numbers 25 utilizing 3-0 chromic gut suture in a continuous interrupted suture technique x1.  4 interrupted 4-0 chromic gut sutures were then placed to further close the surgical site in the area of the mandibular right lingual torus.  At this point time, the entire mouth was irrigated with copious amounts of sterile  saline. The patient was examined for complications, seeing none, the dental medicine procedure was deemed to be complete. The throat pack was removed at this time. A series of 4 x 4 gauze were placed in the mouth to aid hemostasis. An oral airway was then placed at the request of the anesthesia team. The patient was then handed over to the anesthesia team for final disposition. After an appropriate amount of time, the patient was extubated and taken to the postanesthsia care unit with stable vital signs and a good condition. All counts were correct for the dental medicine procedure. The patient will be admitted for 23 hour observation at the request of the anesthesia team. Patient will use Amicar 5% oral rinse post-operatively. Patient is rinse with 10 mL every hour for the next 10 hours to aid hemostasis. No Lovenox should be given until tomorrow morning if needed. The patient is to return to dental medicine in 7-10 days for evaluation for suture removal.   [name], DDS.

## 2014-02-27 NOTE — Anesthesia Procedure Notes (Signed)
Procedure Name: Intubation Date/Time: 02/27/2014 7:34 AM Performed by: Maryland Pink Pre-anesthesia Checklist: Patient identified, Emergency Drugs available, Suction available, Patient being monitored and Timeout performed Patient Re-evaluated:Patient Re-evaluated prior to inductionOxygen Delivery Method: Circle system utilized Preoxygenation: Pre-oxygenation with 100% oxygen Intubation Type: IV induction Ventilation: Mask ventilation without difficulty and Oral airway inserted - appropriate to patient size Laryngoscope Size: Mac and 3 Grade View: Grade II Tube type: Oral Tube size: 7.0 mm Number of attempts: 1 Airway Equipment and Method: Stylet and LTA kit utilized Placement Confirmation: ETT inserted through vocal cords under direct vision,  positive ETCO2,  CO2 detector and breath sounds checked- equal and bilateral Secured at: 21 cm Tube secured with: Tape Dental Injury: Teeth and Oropharynx as per pre-operative assessment

## 2014-02-27 NOTE — H&P (Signed)
02/27/2014  Patient:            [name] [name] Date of Birth:  [birth year] MRN:                [id number]  BP 133/64  Pulse 80  Temp(Src) 98.9 F (37.2 C) (Oral)  Resp 18  Wt 178 lb (80.74 kg)  SpO2 100%  [name] [name] is a 68 year old female with severe stenosis and anticipated aortic valve replacement. Patient is referred by Dr. Darylene Price. Patient currently denies any acute medical or dental changes. Please see Dr. Ricard Dillon note for use as H&P for dental operating room procedure.  [name] [name], DDS   [place] [facility] REPORT   Referring Provider is [name] Mocha, MD Primary Cardiologist is [name] Sark, MD PCP is [name] Nakayama, MD    Chief Complaint   Patient presents with   .  Aortic Stenosis        HPI:   Patient is a 68 year old married African American female from [place] with long-standing history of aortic stenosis, non-ischemic cardiomyopathy with chronic combined systolic and diastolic congestive heart failure, hypertension, type II diabetes mellitus and chronic kidney disease referred to discuss treatment options for the management of low-gradient, low-output severe symptomatic aortic stenosis.  The patient states that she was first told that she had a heart murmur during pregnancy with one of her children. More recently she was followed by [name] for aortic stenosis that was felt to be mild in severity.  She developed progressive LV systolic dysfunction on follow up echocardiograms with ejection fraction estimated 20-25% on echocardiogram performed on 11/07/2011.  At that time the velocity across the aortic valve measured just over 3 m/sec corresponding to a mean transvalvular gradient estimated 25 mmHg.  In April of 2013 she was hospitalized at [facility] with acute respiratory failure requiring intubation and mechanical ventilation due to congestive heart failure with pulmonary  edema.  She was diuresed 25 pounds and stabilized medically. She was ultimately transferred to [facility] where she underwent left and right heart catheterization. She was found to have normal coronary artery anatomy with no significant coronary artery disease.  Pulmonary artery pressures were only mildly elevated. Trans-valvular gradient across the aortic valve was not assessed. The patient has been treated medically ever since and remained clinically stable.  She was seen recently in followup by Dr. [name]. Transthoracic echocardiogram demonstrated further decline in left ventricular function with ejection fraction 15%.  The severity of aortic stenosis appeared worse with peak velocity measured 3.7 m/s corresponding to estimated peak and mean transvalvular gradients of 59 and 32 mmHg, respectively.  A dobutamine echocardiogram was performed, confirming the presence of low-flow, low-gradient severe aortic stenosis with peak velocity across the aortic valve increasing to over 4.2 m/s during dobutamine infusion.  The patient was initially referred to Dr. Burt Knack to discuss treatment options, and the patient has now been referred for surgical consultation.   The patient is married and lives with her husband and granddaughter. She remains fairly active physically and functionally independant. She states that she gets tired more easily than she used to, but for the most part she is able to remain reasonably active and without significant limitations. She states that she only gets short of breath if she really pushes herself physically. She denies exertional shortness of breath with ordinary activity. She denies any recent history of resting shortness of breath, PND, orthopnea, or  lower extremity edema.  She keeps track of her weight on a daily basis and is accustomed to adjusting medical therapy for chronic congestive heart failure. Her mobility is fairly good, although she is somewhat limited because of  degenerative arthritis in her left knee.  Overall she states that she does feel fairly good and she has remained clinically stable for the past 2 years.  However, she also states that she feels as though "something" seems to be slowly getting worse, and she mentions some occasional symptoms of atypical chest discomfort.    Past Medical History   Diagnosis  Date   .  Essential hypertension, benign     .  Hyperlipidemia     .  Anxiety     .  Aortic stenosis     .  History of pneumonia  10/2011   .  Symptomatic PVCs     .  Cardiomyopathy         EF of 20-25% on echo in 10/2011, has declined ICD   .  Diabetes mellitus, type 2     .  Arthritis     .  Nonischemic cardiomyopathy  10/14/2011   .  CKD (chronic kidney disease) stage 3, GFR 30-59 ml/min  05/21/2013   .  Diabetes mellitus, insulin dependent (IDDM), uncontrolled  10/24/2007       hBA1C is 8.1 in 11/2013    .  LBBB (left bundle branch block)  12/03/2013   .  Chronic systolic heart failure  [id number]   .  Obesity  10/24/2007   .  Chronic combined systolic and diastolic CHF (congestive heart failure)           Past Surgical History   Procedure  Laterality  Date   .  Tubal ligation       .  Cesarean section             Family History   Problem  Relation  Age of Onset   .  Heart disease  Mother     .  Heart attack  Mother     .  Diabetes  Mother     .  Hypertension  Mother     .  Colon cancer  Father     .  Breast cancer  Sister     .  Diabetes  Brother     .  Hypertension  Brother     .  Stroke  Brother           History       Social History   .  Marital Status:  Married       Spouse Name:  N/A       Number of Children:  75   .  Years of Education:  N/A       Occupational History   .  Employed         Social History Main Topics   .  Smoking status:  Never Smoker    .  Smokeless tobacco:  Never Used   .  Alcohol Use:  No   .  Drug Use:  No   .  Sexual Activity:  No       Other Topics  Concern   .  Not on  file       Social History Narrative   .  No narrative on file         Current Outpatient Prescriptions   Medication  Sig  Dispense  Refill   .  aspirin EC 81 MG tablet  Take 1 tablet (81 mg total) by mouth daily.   150 tablet   2   .  atorvastatin (LIPITOR) 80 MG tablet  TAKE ONE TABLET DAILY.   30 tablet   3   .  carvedilol (COREG) 25 MG tablet  Take 1 tablet (25 mg total) by mouth 2 (two) times daily.   60 tablet   6   .  fenofibrate (TRICOR) 145 MG tablet  TAKE ONE TABLET BY MOUTH ONCE DAILY.   30 tablet   2   .  furosemide (LASIX) 40 MG tablet  Take 1 tablet (40 mg total) by mouth every morning.   30 tablet   6   .  glucose blood (EASYMAX TEST) test strip  Use as instructed for once daily testing dx 250.00   50 each   5   .  insulin NPH Human (HUMULIN N) 100 UNIT/ML injection  Inject 10 Units into the skin 2 (two) times daily before a meal.         .  Multiple Vitamin (MULITIVITAMIN WITH MINERALS) TABS  Take 1 tablet by mouth every morning.         .  niacin (NIASPAN) 1000 MG CR tablet  TAKE (1) TABLET BY MOUTH AT BEDTIME.   30 tablet   4   .  spironolactone (ALDACTONE) 25 MG tablet  Take 1 tablet (25 mg total) by mouth every morning.   90 tablet   3   .  [DISCONTINUED] cetirizine (ZYRTEC) 10 MG tablet  Take 1 tablet (10 mg total) by mouth daily.   30 tablet   3   .  [DISCONTINUED] ferrous sulfate 325 (65 FE) MG EC tablet  Take 1 tablet (325 mg total) by mouth 2 (two) times daily.   60 tablet   5   .  [DISCONTINUED] FLUoxetine (PROZAC) 10 MG tablet  Take 1 tablet (10 mg total) by mouth daily. Take one capsule by mouth once a day   30 tablet   3   .  [DISCONTINUED] fluticasone (FLONASE) 50 MCG/ACT nasal spray  Place 2 sprays into the nose daily.   16 g   3       No current facility-administered medications for this visit.         Allergies   Allergen  Reactions   .  Penicillins         Family unsure of reaction, but certain mom said she was allergic            Review  of Systems:               General:                      normal appetite, decreased energy, no weight gain, no weight loss, no fever             Cardiac:                      no chest pain with exertion, occasional atypical chest pain at rest, + SOB with exertion, no resting SOB, no PND, no orthopnea, no palpitations, no arrhythmia, no atrial fibrillation, no LE edema, no dizzy spells, no syncope             Respiratory:                no shortness of  breath, no home oxygen, no productive cough, occasional dry cough, no bronchitis, no wheezing, no hemoptysis, no asthma, no pain with inspiration or cough, no sleep apnea, no CPAP at night             GI:                                no difficulty swallowing, no reflux, no frequent heartburn, no hiatal hernia, no abdominal pain, occasional constipation, no diarrhea, no hematochezia, no hematemesis, no melena             GU:                              no dysuria,  no frequency, no urinary tract infection, no hematuria, no kidney stones, + chronic kidney disease             Vascular:                     no pain suggestive of claudication, no pain in feet, no leg cramps, + varicose veins, no DVT, no non-healing foot ulcer             Neuro:                         no stroke, no TIA's, no seizures, no headaches, no temporary blindness one eye,  no slurred speech, no peripheral neuropathy, no chronic pain, no instability of gait, no memory/cognitive dysfunction             Musculoskeletal:         + arthritis particularly in left knee, no joint swelling, no myalgias, mild difficulty walking, normal mobility               Skin:                            no rash, no itching, no skin infections, no pressure sores or ulcerations             Psych:                         no anxiety, no depression, no nervousness, no unusual recent stress             Eyes:                           no blurry vision, + floaters, no recent vision changes, + wears glasses or  contacts             ENT:                            no hearing loss, no loose or painful teeth, no dentures, last saw dentist many years ago, only 2 or 3 remaining teeth that are in poor condition             Hematologic:               no easy bruising, no abnormal bleeding, no clotting disorder, no frequent epistaxis             Endocrine:                   +  diabetes, checks CBG's at home                                                         Physical Exam:               BP 117/73  Pulse 83  Resp 20  Ht 5\' 6"  (1.676 m)  Wt 178 lb (80.74 kg)  BMI 28.74 kg/m2  SpO2 98%             General:                      Mildly obese,  well-appearing             HEENT:                       Unremarkable               Neck:                           no JVD, no bruits, no adenopathy               Chest:                         clear to auscultation, symmetrical breath sounds, no wheezes, no rhonchi               CV:                              RRR, grade IV/VI crescendo/decrescendo murmur heard best at RUSB,  no diastolic murmur             Abdomen:                    soft, non-tender, no masses               Extremities:                 warm, well-perfused, pulses not palpable, no LE edema             Rectal/GU                   Deferred             Neuro:                         Grossly non-focal and symmetrical throughout             Skin:                            Clean and dry, no rashes, no breakdown     Diagnostic Tests:   Transthoracic Echocardiography  Patient:    Asharie, Srivastava MR #:       [id number] Study Date: 11/20/2013 Gender:     F Age:        2 Height:     157.5cm Weight:     80.3kg BSA:        1.36m^2 Pt. Status: Room:    REFERRING  Rozann Lesches  ATTENDING    Lenard Forth  SONOGRAPHER  Lina Sar, RDCS  PERFORMING   Chmg, Forestine Na cc:  ------------------------------------------------------------ LV EF: 15%  ------------------------------------------------------------ Indications:      Aortic stenosis 424.1.  Cardiomyopathy - ischemic 414.8.  ------------------------------------------------------------ History:   PMH:   Congestive heart failure.  Aortic valve disease.  Risk factors:  ischemic cardiomyopathy Hypertension. Dyslipidemia.  ------------------------------------------------------------ Study Conclusions  - Study data: Technically adequate study. - Left ventricle: The cavity size was mildly dilated. Wall   thickness was normal. The estimated ejection fraction was   15%. Diffuse hypokinesis. Doppler parameters are   consistent with abnormal left ventricular relaxation   (grade 1 diastolic dysfunction). - Aortic valve: Moderately to severely calcified annulus.   Severely thickened leaflets. There was severe stenosis by   continuity equation with moderately elevated gradient   consistent with severe low flow low gradient aortic   stenosis in the setting of severe LV systolic dysfunction.   Mild to moderate regurgitation. Valve area: 0.67cm^2(VTI).   Valve area: 0.62cm^2 (Vmax). Regurgitation pressure   half-time: 437ms. - Mitral valve: Mildly to moderately calcified annulus.   Mildly thickened leaflets . Mild regurgitation. - Left atrium: The atrium was moderately dilated. - Right atrium: The atrium was mildly dilated. - Pulmonary arteries: PA peak pressure: 27mm Hg (S). PASP is   borderline elevated. Transthoracic echocardiography.  M-mode, complete 2D, spectral Doppler, and color Doppler.  Height:  Height: 157.5cm. Height: 62in.  Weight:  Weight: 80.3kg. Weight: 176.6lb.  Body mass index:  BMI: 32.4kg/m^2.  Body surface area:    BSA: 1.45m^2.  Blood pressure:     115/52.  Patient status:  Outpatient.  Location:  Echo  laboratory.  ------------------------------------------------------------  ------------------------------------------------------------ Left ventricle:  The cavity size was mildly dilated. Wall thickness was normal. The estimated ejection fraction was 15%. Diffuse hypokinesis. The anteroseptal wall is thinned and akinetic. Doppler parameters are consistent with abnormal left ventricular relaxation (grade 1 diastolic dysfunction).  ------------------------------------------------------------ Aortic valve:   Moderately to severely calcified annulus. Severely thickened leaflets. Uncertain number of leaflets. Doppler:  There was severe stenosis by continuity equation with moderately elevated gradient consistent with severe low flow low gradient aortic stenosis in the setting of severe LV systolic dysfunction.  Mild to moderate regurgitation. VTI ratio of LVOT to aortic valve: 0.21. Valve area: 0.67cm^2(VTI). Indexed valve area: 0.37cm^2/m^2 (VTI). Peak velocity ratio of LVOT to aortic valve: 0.2. Valve area: 0.62cm^2 (Vmax). Indexed valve area: 0.34cm^2/m^2 (Vmax). Mean gradient: 19mm Hg (S). Peak gradient: 23mm Hg (S).  ------------------------------------------------------------ Aorta:  Aortic root: The aortic root was normal in size.  ------------------------------------------------------------ Mitral valve:   Mildly to moderately calcified annulus. Mildly thickened leaflets .  Doppler:   There was no evidence for stenosis.    Mild regurgitation.  ------------------------------------------------------------ Left atrium:  The atrium was moderately dilated.  ------------------------------------------------------------ Right ventricle:  The cavity size was normal. Systolic function was normal. RV TAPSE is 1.8 cm.  ------------------------------------------------------------ Pulmonic valve:   Not well visualized.  Doppler:   There was no evidence for stenosis.    No significant  regurgitation.   ------------------------------------------------------------ Tricuspid valve:   Normal thickness leaflets.  Doppler: There was no evidence for stenosis.    Mild regurgitation.   ------------------------------------------------------------ Pulmonary artery:   PASP is borderline elevated.  ------------------------------------------------------------ Right atrium:  The atrium was mildly dilated.  ------------------------------------------------------------ Pericardium:  There  was no pericardial effusion.  ------------------------------------------------------------ Systemic veins: Inferior vena cava: The vessel was normal in size; the respirophasic diameter changes were in the normal range (= 50%); findings are consistent with normal central venous pressure.  ------------------------------------------------------------  2D measurements        Normal  Doppler measurements   Normal Left ventricle                 Main pulmonary LVID ED,     61 mm     43-52   artery chord,                         Pressure    35 mm Hg   =30 PLAX                           , S LVID ES,     52 mm     23-38   LVOT chord,                         Peak     73.84 cm/s    ------ PLAX                           vel, S FS, chord,   15 %      >29     VTI, S   17.87 cm      ------ PLAX                           Stroke    56.1 ml      ------ LVPW, ED      9 mm     ------  vol IVS/LVPW      1        <1.3    Stroke      31 ml/m^2  ------ ratio, ED                      index Ventricular septum             Aortic valve IVS, ED       9 mm     ------  Peak       372 cm/s    ------ LVOT                           vel, S Diam, S      20 mm     ------  Mean       241 cm/s    ------ Area       3.14 cm^2   ------  vel, S Aorta                          VTI, S    83.2 cm      ------ Root diam,   29 mm     ------  Mean        32 mm Hg   ------ ED                             gradient Left atrium                     ,  S AP dim       40 mm     ------  Peak        59 mm Hg   ------ Vol index,   34 ml/m^2 ------  gradient S                              , S                                VTI       0.21         ------                                ratio                                LVOT/AV                                Area,     0.67 cm^2    ------                                VTI                                Area      0.37 cm^2/m^ ------                                index          2                                (VTI)                                Peak vel   0.2         ------                                ratio,                                LVOT/AV                                Area,     0.62 cm^2    ------                                Vmax                                Area      0.34 cm^2/m^ ------  index          2                                (Vmax)                                Regurg     450 ms      ------                                PHT                                Mitral valve                                Peak E/A   0.5         ------                                ratio                                Right ventricle                                Sa vel,   15.1 cm/s    ------                                lat ann,                                tiss DP   ------------------------------------------------------------ Prepared and Electronically Authenticated by  Kerry Hough, M.D. 2015-04-08T16:[id number]       Stress Echocardiography  Patient:    Shalane, Green MR #:       [id number] Study Date: 12/23/2013 Gender:     F Age:        30 Height:     157.5cm Weight:     80.9kg BSA:        1.74m^2 Pt. Status: Room:    ATTENDING    Buel Ream, Lisabeth Devoid  SONOGRAPHER  [place],  Outpatient cc:  ------------------------------------------------------------  ------------------------------------------------------------ Indications:      Aortic stenosis 424.1.  ------------------------------------------------------------ History:   PMH:  Non-ischemic cardiomyopathy. Aortic stenosis.  Mitral valve disease.  Risk factors: Hypertension. Dyslipidemia.  ------------------------------------------------------------ Study Conclusions  Stress ECG conclusions: The stress ECG was non-diagnostic due to baseline LBBB.  Impressions:  - Dobutamine echocardiogram to assess severity of AS. Note   baseline LV function appeared to be severely reduced on   parasternal images but no further imaging of LV function   performed. Baseline images reveal peak velocity of 3.75   m/s with mean gradient of 31 mmHg and AVA of 0.4 cm2; with   dobutamine at 20  ug, peak velocity increased to 4.2 m/s   and mean gradient of 38 mmHg with AVA 0.4 cm2. Findings   consistent with severe AS. Dobutamine. Stress echocardiography.  2D.  Height:  Height: 157.5cm. Height: 62in.  Weight:  Weight: 80.9kg. Weight: 178lb.  Body mass index:  BMI: 32.6kg/m^2.  Body surface area:    BSA: 1.48m^2.  Blood pressure:     99/51.  Patient status:  Outpatient.  ------------------------------------------------------------  ------------------------------------------------------------ Aortic valve:   Doppler:     VTI ratio of LVOT to aortic valve: 0.12. Indexed valve area: 0.2cm^2/m^2 (VTI). Peak velocity ratio of LVOT to aortic valve: 0.13. Valve area: 0.4cm^2 (Vmax). Indexed valve area: 0.21cm^2/m^2 (Vmax). Mean gradient: 42mm Hg (S). Peak gradient: 20mm Hg (S).   ------------------------------------------------------------ Baseline ECG:   Normal sinus rhythm with left bundle branch block.  ------------------------------------------------------------ Stress  protocol:  +-----------+--+-------+---+------------+--------+---------+ Stage      HRBP     SatRhythm      SymptomsComments               (mmHg)                                  +-----------+--+-------+---+------------+--------+---------+ Baseline   73120/70 95%------------None    ---------              (87)                                    +-----------+--+-------+---+------------+--------+---------+ Dobutamine 73110/76 95%----------------------------- 5 ug/kg/min  (87)                                    +-----------+--+-------+---+------------+--------+---------+ Dobutamine 73124/74 98%Occasional  --------Patient   10           (91)      PVC's               states    ug/kg/min                                  she feels                                            "weird".  +-----------+--+-------+---+------------+--------+---------+ Dobutamine 75130/72 ---Ventricular ----------------- 20           (91)      couplets                      ug/kg/min                                            +-----------+--+-------+---+------------+--------+---------+ Immediate  67--------------------------------------- post stress                                          +-----------+--+-------+---+------------+--------+---------+ Recovery; [id number] 97%----------------------------- min          (102)                                   +-----------+--+-------+---+------------+--------+---------+  Recovery; 283--------------------------------------- min                                                  +-----------+--+-------+---+------------+--------+---------+ Recovery; 375--------------------------------------- min                                                  +-----------+--+-------+---+------------+--------+---------+ Recovery;  [id number] -------------------------------- min          (101)                                   +-----------+--+-------+---+------------+--------+---------+ Recovery; 570-------96%----------------------------- min                                                  +-----------+--+-------+---+------------+--------+---------+ Recovery; 688--------------------------------------- min                                                  +-----------+--+-------+---+------------+--------+---------+  ------------------------------------------------------------ Stress results:   Maximal heart rate during stress was 88bpm. The maximal predicted heart rate was 152bpm.The target heart rate was not achieved. The heart rate response to stress was normal. There was a normal resting blood pressure. Normal blood pressure response to dobutamine. The rate-pressure product for the peak heart rate and blood pressure was 10242mm Hg/min.  The patient experienced no chest pain during stress.  ------------------------------------------------------------ Stress ECG:   Isolated ventricular ectopy. The stress ECG was non-diagnostic due to baseline LBBB.  ------------------------------------------------------------ Baseline:  Low dose: Peak stress: Recovery:  ------------------------------------------------------------  2D measurements   Normal        Doppler measurements   Norma LVOT                                                   l Diam, S   20 mm   ------        LVOT Area    3.14 cm^2 ------        Peak vel,  49 cm/s     -----                                 S                                 VTI, S    10. cm       -----                                             5  Aortic valve                                 Peak vel, 389 cm/s     -----                                 S                                 Mean vel, 267 cm/s      -----                                 S                                 VTI, S    86. cm       -----                                             8                                 Mean       34 mm Hg    -----                                 gradient,                                 S                                 Peak       61 mm Hg    -----                                 gradient,                                 S                                 VTI ratio 0.1          -----                                 LVOT/AV     2                                 Area      0.2 cm^2/m^2 -----  index                                 (VTI)                                 Peak vel  0.1          -----                                 ratio,      3                                 LVOT/AV                                 Area,     0.4 cm^2     -----                                 Vmax                                 Area      0.2 cm^2/m^2 -----                                 index       1                                 (Vmax)   ------------------------------------------------------------ Prepared and Electronically Authenticated by  Kirk Ruths 2015-05-11T19:09:[id number]       Cardiac Catheterization Operative Report   SHAYLINN ZURLO   [id number]   4/22/201312:53 PM   [name] Nakayama, MD, MD   Procedure Performed:  Left Heart Catheterization Selective Coronary Angiography Right Heart Catheterization Left ventricular angiogram Operator: Lauree Chandler, MD   Indication: Cardiomyopathy. Admitted to [facility] with CHF. She is known to have global LV systolic dysfunction with LVEF of 20-25% by echo March 2013. She has been diuresed and doing well. Transferred here for cath.   Procedure Details:   The risks, benefits, complications, treatment options, and expected outcomes were discussed with the patient. The patient and/or family concurred with the proposed  plan, giving informed consent. The patient was brought to the cath lab after IV hydration was begun and oral premedication was given. The patient was further sedated with Versed and Fentanyl. The right groin was prepped and draped in the usual manner. Using the modified Seldinger access technique, a 5 French sheath was placed in the right femoral artery. A 6 French sheath was inserted into the right femoral vein. A balloon tipped catheter was used to perform a right heart catheterization. Standard diagnostic catheters were used to perform selective coronary angiography. A pigtail catheter was used to perform a left ventricular angiogram. There were no immediate complications. The patient was taken to the recovery area in stable condition.   Hemodynamic Findings:   Ao: 103/73   LV:  116/4/6   RA: 6   RV: 37/6/7   PA: 41/12 (mean 20)   PCWP: 8   Fick Cardiac Output: 3.86 L/min   Fick Cardiac Index: 2.02 L/min/m2   Central Aortic Saturation: 95%   Pulmonary Artery Saturation: 55%   Angiographic Findings:   Left main: No evidence of disease.   Left Anterior Descending Artery: Large caliber vessel that courses to the apex. Large caliber diagonal branch. No evidence of disease.   Circumflex Artery: Large caliber vessel with large Obtuse marginal branch. No evidence of disease.   Right Coronary Artery: Moderate sized dominant vessel. No evidence of disease.   Left Ventricular Angiogram: LVEF 20-25%.   Impression:   1. No angiographic evidence of CAD   2. Severe LV systolic dysfunction secondary to non-ischemic cardiomyopathy   3. Normal filling pressures.   Recommendations: Will continue medical management. She may be ready for d/c in the am. She will need consideration for an ICD but will need 3 months of optimal medical therapy before this can be considered.   Complications: None; patient tolerated the procedure well.        STS Risk Calculator   Procedure                                          AVR   Risk of Mortality                                5.1% Morbidity or Mortality                       38.7% Prolonged LOS                                   23.2% Short LOS                                           10.9% Permanent Stroke                            2.0% Prolonged Vent Support                      29.8% DSW Infection                                     0.7% Renal Failure                                       14.0% Reoperation                                        13.0%       Impression:   The patient has stage D severe symptomatic low gradient aortic stenosis with underlying severe left ventricular systolic dysfunction.  She originally presented 2 years ago with  acute respiratory failure and pulmonary edema, but since then she has remained remarkably stable on chronic medical therapy.  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  However, recent echocardiograms suggest further deterioration left ventricular systolic function with simultaneous worsening of severity of aortic stenosis.  I have personally reviewed the patient's previous catheterization and recent echocardiograms. I am also concerned that her aortic valve may be bicuspid, and there is no question that the calcification in the leaflet restriction appears to be somewhat asymmetrical.  I would be a bit more cautious to consider transcatheter aortic valve replacement as an alternative to conventional surgery if the valve is in fact bicuspid, particularly given the fact that her LV function is severely depressed.  In addition, risks associated with conventional surgery would be only moderately increased as predicted using traditional risk models such as the STS risk calculator. However, the appearance of the patient's echocardiogram also suggests the presence of  a relatively small aortic root.  This might further increase the risks associated with conventional surgery and/or potentially increase the chances for significant residual stenosis if aortic valve replacement is performed without root enlargement or root replacement.     Plan:   The patient and her son were counseled at length regarding treatment alternatives for management of severe symptomatic aortic stenosis. The risks and benefits of surgical intervention has been discussed in detail. Long-term prognosis with medical therapy was discussed. Alternative approaches such as conventional surgical aortic valve replacement, transcatheter aortic valve replacement, and palliative medical therapy were compared and contrasted at length. This discussion was placed in the context of the patient's own specific clinical presentation and past medical history.  All of their questions been addressed.  The patient is interested in proceeding with definitive treatment at some point in the not too distant future. She is particularly interested in transcatheter aortic valve replacement, although she understands that TAVR may not prove to be a good choice for her for a variety of reasons.   As a next step we plan to proceed with cardiac gated CT angiogram of the heart to further characterize the functional anatomy of her aortic valve and whether or not TAVR might be anatomically feasible.  This will additionally give [place] better characterization of the size of her aortic root.  At some point she will need repeat diagnostic catheterization performed including direct measurement of transvalvular gradient and right heart catheterization.  Because of her chronic kidney disease these procedures will be staged with plenty of time to allow her renal function to recover. She needs consultation with the dental service as well and likely will need dental extraction.  The patient will return in 3 weeks for  followup.           Valentina Gu. Roxy Manns, MD 01/29/2014 4:21 PM

## 2014-02-27 NOTE — Discharge Instructions (Signed)

## 2014-02-27 NOTE — Progress Notes (Signed)
PRE-OPERATIVE NOTE:  02/27/2014 [name] [id number]  VITALS: BP 133/64  Pulse 80  Temp(Src) 98.9 F (37.2 C) (Oral)  Resp 18  Wt 178 lb (80.74 kg)  SpO2 100%  Lab Results  Component Value Date   WBC 5.0 02/21/2014   HGB 10.4* 02/21/2014   HCT 33.2* 02/21/2014   MCV 83.0 02/21/2014   PLT 236 02/21/2014   BMET    Component Value Date/Time   NA 142 02/21/2014 1027   K 4.1 02/21/2014 1027   CL 103 02/21/2014 1027   CO2 25 02/21/2014 1027   GLUCOSE 164* 02/21/2014 1027   BUN 27* 02/21/2014 1027   CREATININE 1.73* 02/21/2014 1027   CREATININE 1.70* 02/04/2014 0904   CALCIUM 9.9 02/21/2014 1027   GFRNONAA 29* 02/21/2014 1027   GFRNONAA 31* 02/04/2014 0904   GFRAA 34* 02/21/2014 1027   GFRAA 35* 02/04/2014 0904    Lab Results  Component Value Date   INR 1.08 11/30/2011   No results found for this basename: PTT     [name] presents for extraction of remaining teeth with alveoloplasty and pre-prosthetic surgery as indicated in the operating room.    SUBJECTIVE: The patient denies any acute medical or dental changes and agrees to proceed with treatment as planned. Patient accepts potential risk for bleeding, bruising, infection, swelling, pain, root fracture, mandible fracture, sinus involvement, soft tissue damage, floor of mouth damage, anesthesia complications, respiratory complications, cardiovascular complications and other potential risks to include death.  EXAM: No sign of acute dental changes.  ASSESSMENT: Patient is affected by chronic apical periodontitis, chronic periodontitis, multiple retained root segments, bilateral mandibular lingual tori, and dental caries.  PLAN: Patient agrees to proceed with treatment as planned in the operating room as previously discussed and accepts the risks, benefits, complications of the proposed treatment.   [name], DDS

## 2014-02-27 NOTE — Progress Notes (Signed)
Patient admitted to [facility] after having multiple teeth extraction. Patient had a clear liquid tray for lunch, tolerated well. Pain is at a minimum, has no complaints. Bleeding from gums have decreased significantly. [place]icar squeduled Q1.  Ice pack remains in place, guaze dressing in mouth, changed as needed. VS stable.  Patient is comfortable. Will continue to monitor closely.

## 2014-02-27 NOTE — Anesthesia Postprocedure Evaluation (Signed)
  Anesthesia Post-op Note  Patient: [name] [name]  Procedure(s) Performed: Procedure(s): Extraction of tooth #'s 2,4,5,6,7,8,9,10,11,12, 22, 23, 24, 25, 26, 28 with alveoloplasty and bilateral mandibular tori reductions (N/A)  Patient Location: PACU  Anesthesia Type:General  Level of Consciousness: awake, oriented, sedated and patient cooperative  Airway and Oxygen Therapy: Patient Spontanous Breathing  Post-op Pain: none  Post-op Assessment: Post-op Vital signs reviewed, Patient's Cardiovascular Status Stable, Respiratory Function Stable, Patent Airway, No signs of Nausea or vomiting and Pain level controlled  Post-op Vital Signs: stable  Last Vitals:  Filed Vitals:   02/27/14 0930  BP:   Pulse: 90  Temp:   Resp: 13    Complications: No apparent anesthesia complications

## 2014-02-28 ENCOUNTER — Encounter (HOSPITAL_COMMUNITY): Payer: Self-pay | Admitting: Dentistry

## 2014-02-28 DIAGNOSIS — K08109 Complete loss of teeth, unspecified cause, unspecified class: Secondary | ICD-10-CM

## 2014-02-28 DIAGNOSIS — I359 Nonrheumatic aortic valve disorder, unspecified: Secondary | ICD-10-CM

## 2014-02-28 LAB — GLUCOSE, CAPILLARY
Glucose-Capillary: 136 mg/dL — ABNORMAL HIGH (ref 70–99)
Glucose-Capillary: 177 mg/dL — ABNORMAL HIGH (ref 70–99)

## 2014-02-28 NOTE — Progress Notes (Signed)
Utilization review completed.  

## 2014-02-28 NOTE — Progress Notes (Addendum)
POST OPERATIVE NOTE:  02/28/2014 [name] [name] [id number]  VITALS: BP 93/42  Pulse 73  Temp(Src) 97.4 F (36.3 C) (Axillary)  Resp 18  Wt 179 lb 0.2 oz (81.2 kg)  SpO2 98%  LABS:  Lab Results  Component Value Date   WBC 5.0 02/21/2014   HGB 10.4* 02/21/2014   HCT 33.2* 02/21/2014   MCV 83.0 02/21/2014   PLT 236 02/21/2014   BMET    Component Value Date/Time   NA 142 02/21/2014 1027   K 4.1 02/21/2014 1027   CL 103 02/21/2014 1027   CO2 25 02/21/2014 1027   GLUCOSE 164* 02/21/2014 1027   BUN 27* 02/21/2014 1027   CREATININE 1.73* 02/21/2014 1027   CREATININE 1.70* 02/04/2014 0904   CALCIUM 9.9 02/21/2014 1027   GFRNONAA 29* 02/21/2014 1027   GFRNONAA 31* 02/04/2014 0904   GFRAA 34* 02/21/2014 1027   GFRAA 35* 02/04/2014 0904    Lab Results  Component Value Date   INR 1.08 11/30/2011   No results found for this basename: PTT     [name] [name] is status post extraction of remaining teeth with alveoloplasty and bilateral mandibular tori reductions in the OR on 02/27/14.  SUBJECTIVE: The patient denies significant discomfort, but "my mouth is sore". Patient denies having any active bleeding. Patient used ice until this morning.   EXAM: There is no sign of oral infection or active oozing. Clots are present. Sutures are intact. Patient has extraoral and intraoral swelling and bruising. Patient is now edentulous.  ASSESSMENT: Post operative course is consistent with dental procedures performed in the OR. Patient is now edentulous.  PLAN: 1. Use GENTLE salt water rinses every two hours while awake. 2. Brush tongue daily. 3. Advance diet as tolerated from full liquids to soft diet. 4. OK for discharge from dental standpoint. 5. RTC in Dental Medicine on 03/10/14 for suture removal.   [name], DDS

## 2014-02-28 NOTE — Discharge Summary (Signed)
Discharge Summary   Patient ID: [name] [name] MRN: [id number], DOB/AGE: August 06, 1946 68 y.o. Admit date: 02/27/2014 D/C date:     02/28/2014  Primary Cardiologist: Dr. [name]   Principal Problem:   Aortic stenosis Active Problems:   Diabetes mellitus, insulin dependent (IDDM), uncontrolled   HYPERLIPIDEMIA   Obesity   Insomnia   Essential hypertension, benign   Nonischemic cardiomyopathy   Chronic systolic heart failure   CKD (chronic kidney disease) stage 3, GFR 30-59 ml/min   LBBB (left bundle branch block)   Chronic combined systolic and diastolic CHF (congestive heart failure)   Chronic periodontitis   Discharge Diagnosis: Severe AS undergoing TAVR work up-  s/p multiple teeth extractions with alveoloplasty on 02/27/14.  HPI: [name] [name] is a 68 y.o.female with a history of HTN, HLD, AS planned for TAVR, cardiomyopathy- EF 20-25%, DM2, CKD stage 3, chronic combined systolic and diastolic CHF and LBBB who was admitted to [facility] on 02/27/14 for observation after dental extractions for preaortic valve replacement dental work with Dr. Enrique Sack.   Her most recent echocardiograms demonstrate low gradient severe aortic stenosis with associated cardiomyopathy and LVEF of approximately 20%. Dobutamine echocardiogram demonstrated baseline peak AV velocity 3.7 m/s with mean gradient 31 mm mercury, and at 20 mcg/kilogram/minute dobutamine infusion, peak AV velocity increased to 4.2 m/s with mean gradient 38 mm mercury, consistent with severe stenosis. Study did not comment on whether there was any change in LV function. Cardiac catheterization from April 2013 showed no angiographic evidence of CAD with normal filling pressures. The patient was seen by Dr. Enrique Sack for pre TAVR dental examination. She ultimately elected  to proceed with extraction of remaining teeth with alveoloplasty and pre-prosthetic surgery. The patient will then follow the dentist of her choice for fabrication of upper and  lower complete dentures after adequate healing.  Hospital Course: She underwent multiple teeth extractions with alveoloplasty and pre-prosthetic surgery on 02/27/14 by Dr. Enrique Sack. OPERATIONS:  1. Multiple extraction of tooth numbers 2, 4, 5, 6, 7, 8, 9, 10, 11, 12, 22, 23, 24, 25, 26, and 28  2. 4 Quadrants of alveoloplasty  3. Bilateral mandibular lingual tori reductions  PLAN:  1. Use GENTLE salt water rinses every two hours while awake.  2. Brush tongue daily.  3. Advance diet as tolerated from full liquids to soft diet.  5. RTC in Dental Medicine on 03/10/14 for suture removal.   The patient has had an uncomplicated hospital course and is recovering well. She has been seen by Dr. Claiborne Billings today and deemed ready for discharge home. All follow-up appointments have been scheduled. Discharge medications are listed below.    Discharge Vitals: Blood pressure 120/57, pulse 75, temperature 97.4 F (36.3 C), temperature source Axillary, resp. rate 18, weight 179 lb 0.2 oz (81.2 kg), SpO2 98.00%.  Labs: Lab Results  Component Value Date   WBC 5.0 02/21/2014   HGB 10.4* 02/21/2014   HCT 33.2* 02/21/2014   MCV 83.0 02/21/2014   PLT 236 02/21/2014      Diagnostic Studies/Procedures   Dg Chest 2 View  02/21/2014   CLINICAL DATA:  Tooth extraction.  CHF.  Aortic stenosis.  EXAM: CHEST  2 VIEW  COMPARISON:  CT 02/10/2014.  FINDINGS: Heart size upper limits of normal for projection. No airspace disease. No pleural effusion. Unchanged RIGHT middle lobe atelectasis compared to prior CT. This is most evident on the lateral view. Scattered areas of subsegmental atelectasis and/or scarring. No pneumothorax. Mild tortuosity of the  thoracic aorta.  With the benefit of the prior CT, of the 7 mm pulmonary nodule in the LEFT lower lobe is faintly visualized on the frontal view.  IMPRESSION: No active cardiopulmonary disease. 7 mm LEFT lower lobe pulmonary nodule again noted.   Electronically Signed   By:  Dereck Ligas M.D.   On: 02/21/2014 11:13   Ct Coronary Morp W/cta Cor W/score W/ca W/cm &/or Wo/cm  02/10/2014   ADDENDUM REPORT: 02/10/2014 18:48  CLINICAL DATA:  Aortic stenosis  EXAM: Cardiac TAVR CT  TECHNIQUE: The patient was scanned on a Philips 256 scanner. A 120 kV retrospective scan was triggered in the descending thoracic aorta at 111 HU's. Gantry rotation speed was 270 msecs and collimation was .9 mm. 2.5 mg of iv Metoprolol and no nitro were given. The 3D data set was reconstructed in 5% intervals of the R-R cycle. Systolic and diastolic phases were analyzed on a dedicated work station using MPR, MIP and VRT modes. The patient received 80 cc of contrast.  FINDINGS: Aortic Valve: Trileaflet, minimally calcified with restricted leaflet opening  Aorta: Visualized only up the the proximal portion of the ascending thoracic aorta.  Sinotubular Junction:  24 x 24 mm  Ascending Thoracic Aorta:  Not visualized  Aortic Arch:  Not visualized  Descending Thoracic Aorta:  Not visualized  Sinus of Valsalva Measurements:  Non-coronary:  27 mm  Right -coronary:  28 mm  Left -coronary:  27 mm  Coronary Artery Height above Annulus:  Left Main:  13 mm  Right Coronary:  10 mm  Virtual Basal Annulus Measurements:  Maximum/Minimum Diameter:  27 x 22 mm  Perimeter:  92 mm  Area:  455 mm2  Coronary Arteries: The study not intended for coronary evaluation and without use of NTG. There is significant motion in the RCA.  Normal origin of coronary arteries. Right dominance. Mild calcified plague in the mid RCA. Otherwise normal coronaries. Mid to distal LAD is not visualized.  Optimum Fluoroscopic Angle for Delivery:  RAO 0, CRA 5.  IMPRESSION: 1. Aortic valve measurements optimal for delivery of 26 mm Edward - Sapien XT THV.  2. Sufficient annulus to coronary distance for delivery of 26 mm Edward - Sapien XT THV.  3. Optimum Fluoroscopic Angle for Delivery is RAO 0, CRA 5.  Ena Dawley   Electronically Signed   By:  Ena Dawley   On: 02/10/2014 18:48   02/10/2014   EXAM: OVER-READ INTERPRETATION  CT CHEST  The following report is an over-read performed by radiologist Dr. Rebekah Chesterfield [name] on 02/10/2014. This over-read does not include interpretation of cardiac or coronary anatomy or pathology. The coronary calcium score/coronary CTA interpretation by the cardiologist is attached.  COMPARISON:  No priors.  FINDINGS: Linear opacity in the inferior segment of the lingula is compatible with subsegmental atelectasis or scarring. 7 x 4 mm left lower lobe nodule (image 53 of series 502). Calcification in the distal bronchus intermedius which appears to extend in the proximal right middle lobe bronchus. This is associated with what appears to be chronic collapse of the right middle lobe where there is some mild cylindrical bronchiectasis. No pleural effusions. Small calcifications in the liver are compatible with calcified granulomas. There are no aggressive appearing lytic or blastic lesions noted in the visualized portions of the skeleton.  IMPRESSION: 1. 7 x 4 mm left lower lobe pulmonary nodule. If the patient is at high risk for bronchogenic carcinoma, follow-up chest CT at 3-38months is  recommended. If the patient is at low risk for bronchogenic carcinoma, follow-up chest CT at 6-12 months is recommended. This recommendation follows the consensus statement: Guidelines for Management of Small Pulmonary Nodules Detected on CT Scans: A Statement from the [place] as published in Radiology 2005; 237:395-400. 2. There appears to be a calcified broncholith within the distal bronchus intermedius and proximal right middle lobe bronchus which is presumably obstructive, as there is what appears to be chronic collapse of the right middle lobe. Nonemergent bronchoscopic correlation may be warranted if clinically indicated.  Electronically Signed: By: Vinnie Langton M.D. On: 02/10/2014 15:38     Discharge Medications     Medication List         acetaminophen 500 MG tablet  Commonly known as:  TYLENOL  Take 1,000 mg by mouth every 6 (six) hours as needed.     aspirin EC 81 MG tablet  Take 1 tablet (81 mg total) by mouth daily.     atorvastatin 80 MG tablet  Commonly known as:  LIPITOR  TAKE ONE TABLET DAILY.     carvedilol 25 MG tablet  Commonly known as:  COREG  Take 1 tablet (25 mg total) by mouth 2 (two) times daily.     fenofibrate 145 MG tablet  Commonly known as:  TRICOR  Take 145 mg by mouth daily.     furosemide 40 MG tablet  Commonly known as:  LASIX  Take 1 tablet (40 mg total) by mouth every morning.     HUMULIN N 100 UNIT/ML injection  Generic drug:  insulin NPH Human  Inject 10 Units into the skin 2 (two) times daily before a meal.     multivitamin with minerals Tabs tablet  Take 1 tablet by mouth every morning.     niacin 1000 MG CR tablet  Commonly known as:  NIASPAN  Take 1,000 mg by mouth at bedtime.     spironolactone 25 MG tablet  Commonly known as:  ALDACTONE  Take 1 tablet (25 mg total) by mouth every morning.        Disposition   The patient will be discharged in stable condition to home. Discharge Instructions   Consult to dietitian    Complete by:  As directed   Patient is edentulous.     Gauze    Complete by:  As directed   4 x 4 gauze to oral bleeding sites until oozing stops.          Follow-up Information   Follow up with [name], DDS On 03/10/2014. (For suture removal)    Specialty:  Dentistry   Contact information:   [place] [address] [phone]         Duration of Discharge Encounter: Greater than 30 minutes including physician and PA time.  SignedVertell Limber, [place] PA-C 02/28/2014, 2:45 PM

## 2014-02-28 NOTE — Progress Notes (Signed)
Pt D/C'd home with son.  Waiting for son in discharge lounge.  Alert and oriented x4.  No c/o pain.  IV D/C'd.  Tele D/C'd.  No c/o pain.  Pt given d/c instructions on diet, activity, meds and follow-up care and instructions.  Pt verbalized understanding.

## 2014-03-03 ENCOUNTER — Encounter: Payer: Self-pay | Admitting: Thoracic Surgery (Cardiothoracic Vascular Surgery)

## 2014-03-03 ENCOUNTER — Ambulatory Visit (INDEPENDENT_AMBULATORY_CARE_PROVIDER_SITE_OTHER): Payer: Medicare Other | Admitting: Thoracic Surgery (Cardiothoracic Vascular Surgery)

## 2014-03-03 VITALS — BP 116/72 | HR 78 | Resp 20 | Ht 65.0 in | Wt 179.0 lb

## 2014-03-03 DIAGNOSIS — I5022 Chronic systolic (congestive) heart failure: Secondary | ICD-10-CM

## 2014-03-03 DIAGNOSIS — I428 Other cardiomyopathies: Secondary | ICD-10-CM

## 2014-03-03 DIAGNOSIS — I359 Nonrheumatic aortic valve disorder, unspecified: Secondary | ICD-10-CM

## 2014-03-03 DIAGNOSIS — I350 Nonrheumatic aortic (valve) stenosis: Secondary | ICD-10-CM

## 2014-03-03 DIAGNOSIS — J9819 Other pulmonary collapse: Secondary | ICD-10-CM

## 2014-03-03 DIAGNOSIS — R911 Solitary pulmonary nodule: Secondary | ICD-10-CM

## 2014-03-03 DIAGNOSIS — I5042 Chronic combined systolic (congestive) and diastolic (congestive) heart failure: Secondary | ICD-10-CM

## 2014-03-03 DIAGNOSIS — I509 Heart failure, unspecified: Secondary | ICD-10-CM

## 2014-03-03 NOTE — Patient Instructions (Signed)
Schedule appt with Pulmonologist (Dr Lamonte Sakai) as soon as practical  Schedule follow up appt with Dr Burt Knack

## 2014-03-03 NOTE — Progress Notes (Signed)
[address]       [place] [address]             [facility] OFFICE NOTE  Referring Provider is Sherren Mocha, MD PCP is [name], MD   HPI:  Patient returns to the office today for followup of stage D severe symptomatic low gradient aortic stenosis with underlying severe left ventricular systolic dysfunction. She was originally seen in consultation on 01/29/2014.  Since then she underwent cardiac gated CT angiogram of the heart, and last week she underwent dental extraction. She returns to the office today for followup. She states that she did well with dental extraction and has had minimal pain. She has not had any acute exacerbation of shortness of breath. She states that her husband just got out of the hospital. She states that presently she feels like she is doing well and she would not consider proceeding with elective aortic valve replacement in the immediate future.   Current Outpatient Prescriptions  Medication Sig Dispense Refill  . acetaminophen (TYLENOL) 500 MG tablet Take 1,000 mg by mouth every 6 (six) hours as needed.      Marland Kitchen aspirin EC 81 MG tablet Take 1 tablet (81 mg total) by mouth daily.  150 tablet  2  . atorvastatin (LIPITOR) 80 MG tablet TAKE ONE TABLET DAILY.  30 tablet  2  . carvedilol (COREG) 25 MG tablet Take 1 tablet (25 mg total) by mouth 2 (two) times daily.  60 tablet  6  . fenofibrate (TRICOR) 145 MG tablet Take 145 mg by mouth daily.      . furosemide (LASIX) 40 MG tablet Take 1 tablet (40 mg total) by mouth every morning.  30 tablet  6  . insulin NPH Human (HUMULIN N) 100 UNIT/ML injection Inject 10 Units into the skin 2 (two) times daily before a meal.      . Multiple Vitamin (MULITIVITAMIN WITH MINERALS) TABS Take 1 tablet by mouth every morning.      . niacin (NIASPAN) 1000 MG CR tablet Take 1,000 mg by mouth at bedtime.      Marland Kitchen spironolactone (ALDACTONE) 25 MG tablet Take 1 tablet (25 mg  total) by mouth every morning.  90 tablet  3  . [DISCONTINUED] cetirizine (ZYRTEC) 10 MG tablet Take 1 tablet (10 mg total) by mouth daily.  30 tablet  3  . [DISCONTINUED] ferrous sulfate 325 (65 FE) MG EC tablet Take 1 tablet (325 mg total) by mouth 2 (two) times daily.  60 tablet  5  . [DISCONTINUED] FLUoxetine (PROZAC) 10 MG tablet Take 1 tablet (10 mg total) by mouth daily. Take one capsule by mouth once a day  30 tablet  3  . [DISCONTINUED] fluticasone (FLONASE) 50 MCG/ACT nasal spray Place 2 sprays into the nose daily.  16 g  3   No current facility-administered medications for this visit.      Physical Exam:   BP 116/72  Pulse 78  Resp 20  Ht 5\' 5"  (1.651 m)  Wt 179 lb (81.194 kg)  BMI 29.79 kg/m2  SpO2 98%  General:  Well-appearing  Chest:   Clear to auscultation  CV:   Regular rate and rhythm with systolic murmur  Incisions:  n/a  Abdomen:  Soft nontender  Extremities:  Warm and well-perfused  Diagnostic Tests:  CARDIAC-GATED CT ANGIOGRAM OF HEART - MORPHOLOGY  Provider Default, MD Mon Feb 10, 2014 6:50:43 PM EDT  ADDENDUM REPORT: 02/10/2014 18:48  CLINICAL DATA: Aortic stenosis  EXAM:  Cardiac TAVR CT  TECHNIQUE:  The patient was scanned on a Philips 256 scanner. A 120 kV  retrospective scan was triggered in the descending thoracic aorta at  111 HU's. Gantry rotation speed was 270 msecs and collimation was .9  mm. 2.5 mg of iv Metoprolol and no nitro were given. The 3D data set  was reconstructed in 5% intervals of the R-R cycle. Systolic and  diastolic phases were analyzed on a dedicated work station using  MPR, MIP and VRT modes. The patient received 80 cc of contrast.  FINDINGS:  Aortic Valve: Trileaflet, minimally calcified with restricted  leaflet opening  Aorta: Visualized only up the the proximal portion of the ascending  thoracic aorta.  Sinotubular Junction: 24 x 24 mm  Ascending Thoracic Aorta: Not visualized  Aortic Arch: Not visualized    Descending Thoracic Aorta: Not visualized  Sinus of Valsalva Measurements:  Non-coronary: 27 mm  Right -coronary: 28 mm  Left -coronary: 27 mm  Coronary Artery Height above Annulus:  Left Main: 13 mm  Right Coronary: 10 mm  Virtual Basal Annulus Measurements:  Maximum/Minimum Diameter: 27 x 22 mm  Perimeter: 92 mm  Area: 455 mm2  Coronary Arteries: The study not intended for coronary evaluation  and without use of NTG. There is significant motion in the RCA.  Normal origin of coronary arteries. Right dominance. Mild calcified  plague in the mid RCA. Otherwise normal coronaries. Mid to distal  LAD is not visualized.  Optimum Fluoroscopic Angle for Delivery: RAO 0, CRA 5.  IMPRESSION:  1. Aortic valve measurements optimal for delivery of 26 mm Edward -  Sapien XT THV.  2. Sufficient annulus to coronary distance for delivery of 26 mm  Edward - Sapien XT THV.  3. Optimum Fluoroscopic Angle for Delivery is RAO 0, CRA 5.  Ena Dawley  Electronically Signed  By: Ena Dawley  On: 02/10/2014 18:48       Study Result    EXAM:  OVER-READ INTERPRETATION CT CHEST  The following report is an over-read performed by radiologist Dr.  Rebekah Chesterfield [facility] on 02/10/2014. This  over-read does not include interpretation of cardiac or coronary  anatomy or pathology. The coronary calcium score/coronary CTA  interpretation by the cardiologist is attached.  COMPARISON: No priors.  FINDINGS:  Linear opacity in the inferior segment of the lingula is compatible  with subsegmental atelectasis or scarring. 7 x 4 mm left lower lobe  nodule (image 53 of series 502). Calcification in the distal  bronchus intermedius which appears to extend in the proximal right  middle lobe bronchus. This is associated with what appears to be  chronic collapse of the right middle lobe where there is some mild  cylindrical bronchiectasis. No pleural effusions. Small  calcifications in  the liver are compatible with calcified  granulomas. There are no aggressive appearing lytic or blastic  lesions noted in the visualized portions of the skeleton.  IMPRESSION:  1. 7 x 4 mm left lower lobe pulmonary nodule. If the patient is at  high risk for bronchogenic carcinoma, follow-up chest CT at  3-42months is recommended. If the patient is at low risk for  bronchogenic carcinoma, follow-up chest CT at 6-12 months is  recommended. This recommendation follows the consensus statement:  Guidelines for Management of Small Pulmonary Nodules Detected on CT  Scans: A Statement from the [place] as published in  Radiology 2005; 237:395-400.  2. There appears to be a calcified broncholith within the distal  bronchus intermedius and proximal right middle lobe bronchus which  is presumably obstructive, as there is what appears to be chronic  collapse of the right middle lobe. Nonemergent bronchoscopic  correlation may be warranted if clinically indicated.  Electronically Signed:  By: Vinnie Langton M.D.  On: 02/10/2014 15:38      Impression:  The patient has stage D severe symptomatic low gradient aortic stenosis with underlying severe left ventricular systolic dysfunction. She originally presented 2 years ago with acute respiratory failure and pulmonary edema, but since then she has remained remarkably stable on chronic medical therapy. 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 However, recent echocardiograms suggest further deterioration left ventricular systolic function with simultaneous worsening of severity of aortic stenosis.  I have personally reviewed the patient's recent cardiac gated CT angiogram of the heart. The aortic valve is trileaflet although there may be fusion of the left and noncoronary leaflet. Functional  anatomy would potentially be feasible for placement of transcatheter aortic valve, although risks associated with conventional surgery would only be moderately elevated.  At present the patient remains clinically stable and for a variety of reasons she states that she would not consider proceeding with surgery in the immediate future.  Incidentally discovered at the time of her CT angiogram is the presence of what appears to be a broncholith obstructing the right middle lobe bronchus with chronic right middle lobe collapse as well as a small benign-appearing nodule in the left lower lobe. Given her history of at least 2 episodes of pneumonia in the past, she needs flexible bronchoscopy performed.   Plan:  I discussed matters at length with the patient in the office today. I explained that although she remains clinically stable at the present time, the recent decline in her left ventricular function is concerning. Further delay of definitive management of her aortic stenosis carries some degree of risk that her left ventricular function might continue to deteriorate irreversibly. I also discussed with her the findings noted on the CT scan of her chest and the associated need for bronchoscopy. She is willing to be evaluated by a pulmonologist and consider outpatient flexible fiberoptic bronchoscopy. We will make certain that she has followup appointment scheduled with Dr. Burt Knack within the next one to 2 months. If her husband has improved she might be willing to consider elective aortic valve replacement, and she will need diagnostic cardiac catheterization performed prior to surgery. She will return to see [place] here in our office in 8 weeks. All of her questions been addressed.   I spent in excess of 15 minutes during the conduct of this office consultation and >50% of this time involved direct face-to-face encounter with the patient for counseling and/or coordination of their care.   Valentina Gu. Roxy Manns,  MD 03/03/2014 9:59 AM

## 2014-03-07 LAB — HEMOGLOBIN A1C: Hemoglobin-A1c: 8.3

## 2014-03-17 ENCOUNTER — Encounter (INDEPENDENT_AMBULATORY_CARE_PROVIDER_SITE_OTHER): Payer: Self-pay

## 2014-03-17 ENCOUNTER — Ambulatory Visit (HOSPITAL_COMMUNITY): Payer: Medicaid - Dental | Admitting: Dentistry

## 2014-03-17 ENCOUNTER — Encounter (HOSPITAL_COMMUNITY): Payer: Self-pay | Admitting: Dentistry

## 2014-03-17 ENCOUNTER — Telehealth: Payer: Self-pay | Admitting: Emergency Medicine

## 2014-03-17 VITALS — BP 126/71 | HR 70 | Temp 98.8°F

## 2014-03-17 DIAGNOSIS — K08109 Complete loss of teeth, unspecified cause, unspecified class: Secondary | ICD-10-CM

## 2014-03-17 DIAGNOSIS — K0820 Unspecified atrophy of edentulous alveolar ridge: Secondary | ICD-10-CM

## 2014-03-17 DIAGNOSIS — K08409 Partial loss of teeth, unspecified cause, unspecified class: Secondary | ICD-10-CM

## 2014-03-17 DIAGNOSIS — K000 Anodontia: Secondary | ICD-10-CM

## 2014-03-17 DIAGNOSIS — I359 Nonrheumatic aortic valve disorder, unspecified: Secondary | ICD-10-CM

## 2014-03-17 NOTE — Telephone Encounter (Signed)
Message copied by Collene Gobble on Mon Mar 17, 2014  2:11 PM ------      Message from: Drue Second A      Created: Tue Mar 04, 2014 12:29 PM       Dr Roxy Manns would like to schedule consult appt with you- "right middle lobe syndrome needs bronchoscopy".  I talked with scheduling and they do not have anything until 8/26 at 3:30pm. Would you like to review the notes to see if patient needs sooner appointment.   Thanks            [employer]       ------

## 2014-03-17 NOTE — Patient Instructions (Signed)
Continue salt water rinses as needed to aid healing. Maintain soft diet and advance as tolerated. Maintain use of nutritional supplements as needed. Follow up with a dentist of her choice for fabrication of upper and lower complete dentures after adequate healing. Quote for upper and lower complete dentures was provided to the patient today. Patient is now cleared for heart valve surgery.  Dr. Enrique Sack

## 2014-03-17 NOTE — Telephone Encounter (Signed)
Can I work this patient in any sooner? Thanks

## 2014-03-17 NOTE — Progress Notes (Signed)
POST OPERATIVE NOTE:  03/17/2014 DERICA HEYNEN [id number]  VITALS: BP 126/71  Pulse 70  Temp(Src) 98.8 F (37.1 C) (Oral)  LABS:  Lab Results  Component Value Date   WBC 5.0 02/21/2014   HGB 10.4* 02/21/2014   HCT 33.2* 02/21/2014   MCV 83.0 02/21/2014   PLT 236 02/21/2014   BMET    Component Value Date/Time   NA 142 02/21/2014 1027   K 4.1 02/21/2014 1027   CL 103 02/21/2014 1027   CO2 25 02/21/2014 1027   GLUCOSE 164* 02/21/2014 1027   BUN 27* 02/21/2014 1027   CREATININE 1.73* 02/21/2014 1027   CREATININE 1.70* 02/04/2014 0904   CALCIUM 9.9 02/21/2014 1027   GFRNONAA 29* 02/21/2014 1027   GFRNONAA 31* 02/04/2014 0904   GFRAA 34* 02/21/2014 1027   GFRAA 35* 02/04/2014 0904    Lab Results  Component Value Date   INR 1.08 11/30/2011   No results found for this basename: PTT     [name] is status post extraction remaining teeth with alveoloplasty and pre-prosthetic surgery as indicated in the operating room general anesthesia on 02/27/2014. The patient now presents for evaluation of healing and suture removal as needed.  SUBJECTIVE: Patient denies having any significant oral discomfort. Patient denies having any sutures that remain. Patient is maintaining a soft diet and nutritional supplementation as needed.   EXAM: There is no sign of infection, heme, or ooze. Intraoral and extraoral ecchymoses have resolved. She is now edentulous. There is atrophy of the edentulous alveolar ridges.  PROCEDURE: The patient was given a chlorhexidine gluconate rinse for 30 seconds. Sutures were then removed without complication. Patient tolerated the procedure well.  ASSESSMENT: Post operative course is consistent with dental procedures performed in the OR. Edentulous Atrophy of alveolar ridges. Less than ideal prognosis for successful upper and lower complete denture fabrication.  PLAN: Continue salt water rinses as needed to aid healing. Maintain soft diet and advance as  tolerated. Maintain use of nutritional supplements as needed. Follow up with a dentist of her choice for fabrication of upper and lower complete dentures after adequate healing. Quote for upper and lower complete dentures was provided to the patient today. Patient is now cleared for heart valve surgery.  [name], DDS

## 2014-03-17 NOTE — Telephone Encounter (Signed)
No available slots open. Do you want [place] to overbook on a day you are not already overbooked on? thanks

## 2014-03-18 NOTE — Telephone Encounter (Signed)
Called spoke with pt. She was not able to come in until 04/04/14 at 1:30 d/t her having her daughter bring her. Will make RB aware

## 2014-03-18 NOTE — Telephone Encounter (Signed)
Yes

## 2014-03-18 NOTE — Telephone Encounter (Signed)
Overbook please.

## 2014-03-18 NOTE — Telephone Encounter (Signed)
Thank you :)

## 2014-03-27 ENCOUNTER — Ambulatory Visit: Payer: Medicare Other | Admitting: Cardiology

## 2014-03-27 ENCOUNTER — Other Ambulatory Visit: Payer: Self-pay | Admitting: Family Medicine

## 2014-04-03 ENCOUNTER — Telehealth: Payer: Self-pay

## 2014-04-03 NOTE — Telephone Encounter (Signed)
Currently only taking Humulin 10 units BID. The diabetes pills that Dr Dorris Fetch keeps calling in are too expensive. The first ones were $70 and the 2nd ones were $45. States she just doesn't have this kind of money. Wants to know if you will call her in something for her sugar. Used to be on Glipizide and it was cheap but was taken off of it. Please advise

## 2014-04-03 NOTE — Telephone Encounter (Signed)
Best that she explain to Dr Dorris Fetch the problem with medication cost , leave a msg with his nurse if necessary, since he is treating her diabetes, he needs to be the one to change the med. Encourage her top go this route as I am sure that he will help her in this way

## 2014-04-03 NOTE — Telephone Encounter (Signed)
Faxed note over to Dr nida's office explaining the situation. Patient aware

## 2014-04-04 ENCOUNTER — Institutional Professional Consult (permissible substitution): Payer: Self-pay | Admitting: Emergency Medicine

## 2014-04-09 ENCOUNTER — Institutional Professional Consult (permissible substitution): Payer: Self-pay | Admitting: Emergency Medicine

## 2014-04-09 ENCOUNTER — Ambulatory Visit: Payer: Self-pay | Admitting: Cardiovascular Disease

## 2014-04-15 ENCOUNTER — Telehealth: Payer: Self-pay | Admitting: *Deleted

## 2014-04-15 NOTE — Telephone Encounter (Signed)
Pt cant afford meds Dr nida is prescribing and she will try to get a cheaper med from him

## 2014-04-15 NOTE — Telephone Encounter (Signed)
Pt called requesting to speak with Brandi, Pt wants Brandi to call her back at [phone]

## 2014-04-18 ENCOUNTER — Encounter: Payer: Self-pay | Admitting: Cardiovascular Disease

## 2014-04-18 ENCOUNTER — Ambulatory Visit (INDEPENDENT_AMBULATORY_CARE_PROVIDER_SITE_OTHER): Payer: Medicare Other | Admitting: Cardiovascular Disease

## 2014-04-18 VITALS — BP 118/68 | HR 78 | Ht 65.0 in | Wt 174.1 lb

## 2014-04-18 DIAGNOSIS — I359 Nonrheumatic aortic valve disorder, unspecified: Secondary | ICD-10-CM

## 2014-04-18 NOTE — Patient Instructions (Signed)
Your physician recommends that you continue to follow-up appointment with Dr Roxy Manns and Dr [name].

## 2014-04-20 ENCOUNTER — Encounter: Payer: Self-pay | Admitting: Cardiovascular Disease

## 2014-04-20 NOTE — Progress Notes (Signed)
HPI:  68 year-old woman presents for further discussion of treatment options for her severe symptomatic aortic stenosis. She has been diagnosed with low-gradient severe aortic stenosis with known severe baseline LV dysfunction. She underwent dobutamine echo in May confirming low-gradient AS and subsequently underwent a gated cardiac CT while options of surgical versus transcatheter AVR were under consideration. She has had follow-up with Dr Roxy Manns and surgical AVR has been recommended because of her relatively young age, severe LV dysfunction, and only moderately elevated risk of surgery.  The patient continues to feel that 'something is wrong' with her heart. She describes a chest discomfort but has difficulty characterizing it. There is no exertional chest pain. She admits to mild exertional dyspnea with no recent change in symptoms. No edema, orthopnea, or PND. No lightheadedness or syncope.  Outpatient Encounter Prescriptions as of 04/18/2014  Medication Sig  . acetaminophen (TYLENOL) 500 MG tablet Take 1,000 mg by mouth every 6 (six) hours as needed.  Marland Kitchen aspirin EC 81 MG tablet Take 1 tablet (81 mg total) by mouth daily.  Marland Kitchen atorvastatin (LIPITOR) 80 MG tablet TAKE ONE TABLET DAILY.  . carvedilol (COREG) 25 MG tablet Take 1 tablet (25 mg total) by mouth 2 (two) times daily.  . fenofibrate (TRICOR) 145 MG tablet TAKE ONE TABLET BY MOUTH ONCE DAILY.  . furosemide (LASIX) 40 MG tablet Take 1 tablet (40 mg total) by mouth every morning.  . insulin NPH Human (HUMULIN N) 100 UNIT/ML injection Inject 10 Units into the skin 2 (two) times daily before a meal.  . Multiple Vitamin (MULITIVITAMIN WITH MINERALS) TABS Take 1 tablet by mouth every morning.  . niacin (NIASPAN) 1000 MG CR tablet Take 1,000 mg by mouth at bedtime.  Marland Kitchen spironolactone (ALDACTONE) 25 MG tablet Take 1 tablet (25 mg total) by mouth every morning.    Allergies  Allergen Reactions  . Penicillins     Family unsure of reaction, but  certain mom said she was allergic    Past Medical History  Diagnosis Date  . Essential hypertension, benign   . Hyperlipidemia   . Anxiety   . Aortic stenosis   . History of pneumonia 10/2011  . Symptomatic PVCs   . Cardiomyopathy     EF of 20-25% on echo in 10/2011, has declined ICD  . Diabetes mellitus, type 2   . Arthritis   . Nonischemic cardiomyopathy 10/14/2011  . CKD (chronic kidney disease) stage 3, GFR 30-59 ml/min 05/21/2013  . Diabetes mellitus, insulin dependent (IDDM), uncontrolled 10/24/2007    hBA1C is 8.1 in 11/2013   . Chronic systolic heart failure [id number]  . Obesity 10/24/2007  . Chronic combined systolic and diastolic CHF (congestive heart failure)   . Shortness of breath      with too much work-   . Heart murmur   . LBBB (left bundle branch block) 12/03/2013  . Pneumonia 2013  . Depression   . Constipation   . Incidental pulmonary nodule, > 62mm and < 64mm 02/10/2014    7 x 4 mm LLL nodule  . 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 Abd: soft, NT, Positive BS, no hepatomegaly Ext: no C/C/E, distal pulses intact and equal Skin: warm/dry no rash  Dobutamine echo: Impressions:  - Dobutamine echocardiogram to assess severity of AS. Note baseline LV function appeared to be severely reduced on parasternal images but no further imaging of LV function performed. Baseline images reveal peak velocity of 3.75 m/s with mean gradient of 31 mmHg and AVA of 0.4 cm2; with dobutamine at 20 ug, peak velocity increased to 4.2 m/s and mean gradient of 38 mmHg with AVA 0.4 cm2. Findings consistent with severe AS.  Cardiac  CTA FINDINGS:  Aortic Valve: Trileaflet, minimally calcified with restricted  leaflet opening  Aorta: Visualized only up the the proximal portion of the ascending  thoracic aorta.  Sinotubular Junction: 24 x 24 mm  Ascending Thoracic Aorta: Not visualized  Aortic Arch: Not visualized  Descending Thoracic Aorta: Not visualized  Sinus of Valsalva Measurements:  Non-coronary: 27 mm  Right -coronary: 28 mm  Left -coronary: 27 mm  Coronary Artery Height above Annulus:  Left Main: 13 mm  Right Coronary: 10 mm  Virtual Basal Annulus Measurements:  Maximum/Minimum Diameter: 27 x 22 mm  Perimeter: 92 mm  Area: 455 mm2  Coronary Arteries: The study not intended for coronary evaluation  and without use of NTG. There is significant motion in the RCA.  Normal origin of coronary arteries. Right dominance. Mild calcified  plague in the mid RCA. Otherwise normal coronaries. Mid to distal  LAD is not visualized.  Optimum Fluoroscopic Angle for Delivery: RAO 0, CRA 5.  IMPRESSION:  1. Aortic valve measurements optimal for delivery of 26 mm Edward -  Sapien XT THV.  2. Sufficient annulus to coronary distance for delivery of 26 mm  Edward - Sapien XT THV.  3. Optimum Fluoroscopic Angle for Delivery is RAO 0, CRA 5.  Ena Dawley  ASSESSMENT AND PLAN: 1. Severe low-gradient aortic stenosis 2. Severe cardiomyopathy with LVEF less than 30% 3. Stage 3 CKD with GFR approximately 30  The patient has decided to move forward with cardiac surgery for treatment of her aortic stenosis. I agree that this is indicated and she otherwise would be at high risk of further deterioration of what is already severe LV dysfunction, progressive CHF, and death. She sees Dr Roxy Manns in follow-up in the near future. She had normal coronaries at cath in 2013, and no obstructive CAD on cardiac CT recently, but the study was not gated for coronary assessment. Will check with Dr Roxy Manns but suspect he will want limited dye  coronary angiography prior to surgery. If so, will arrange this with appropriate pre-hydration in the setting of her CKD. Otherwise the patient will follow-up with Dr [name] for ongoing cardiac care and I will be happy to see her as needed in the future.  Sherren Mocha MD 04/20/2014 9:21 AM

## 2014-04-23 ENCOUNTER — Encounter: Payer: Self-pay | Admitting: Family Medicine

## 2014-04-23 ENCOUNTER — Ambulatory Visit (INDEPENDENT_AMBULATORY_CARE_PROVIDER_SITE_OTHER): Payer: Medicare Other | Admitting: Family Medicine

## 2014-04-23 ENCOUNTER — Telehealth: Payer: Self-pay

## 2014-04-23 VITALS — BP 118/78 | HR 86 | Resp 16 | Ht 65.0 in | Wt 177.0 lb

## 2014-04-23 DIAGNOSIS — IMO0001 Reserved for inherently not codable concepts without codable children: Secondary | ICD-10-CM

## 2014-04-23 DIAGNOSIS — E785 Hyperlipidemia, unspecified: Secondary | ICD-10-CM

## 2014-04-23 DIAGNOSIS — Z23 Encounter for immunization: Secondary | ICD-10-CM

## 2014-04-23 DIAGNOSIS — E1165 Type 2 diabetes mellitus with hyperglycemia: Secondary | ICD-10-CM

## 2014-04-23 DIAGNOSIS — Z0000 Encounter for general adult medical examination without abnormal findings: Secondary | ICD-10-CM

## 2014-04-23 DIAGNOSIS — I10 Essential (primary) hypertension: Secondary | ICD-10-CM

## 2014-04-23 DIAGNOSIS — Z794 Long term (current) use of insulin: Secondary | ICD-10-CM

## 2014-04-23 DIAGNOSIS — IMO0002 Reserved for concepts with insufficient information to code with codable children: Secondary | ICD-10-CM

## 2014-04-23 DIAGNOSIS — E1065 Type 1 diabetes mellitus with hyperglycemia: Secondary | ICD-10-CM

## 2014-04-23 LAB — GLUCOSE, POCT (MANUAL RESULT ENTRY): POC Glucose: 156 mg/dl — AB (ref 70–99)

## 2014-04-23 MED ORDER — NIACIN ER (ANTIHYPERLIPIDEMIC) 1000 MG PO TBCR: 1000.0000 mg | EXTENDED_RELEASE_TABLET | Freq: Every day | ORAL | Status: DC

## 2014-04-23 MED ORDER — FENOFIBRATE 145 MG PO TABS: ORAL_TABLET | ORAL | Status: DC

## 2014-04-23 MED ORDER — INSULIN LISPRO PROT & LISPRO (50-50 MIX) 100 UNIT/ML KWIKPEN: 10.0000 [IU] | PEN_INJECTOR | Freq: Two times a day (BID) | SUBCUTANEOUS | Status: DC

## 2014-04-23 NOTE — Telephone Encounter (Signed)
pls find out from the pharmacy what insulin is affordable for her, ( there is an affordable insulin at [facility] what is that?

## 2014-04-23 NOTE — Progress Notes (Signed)
Subjective:    Patient ID: [name] [name], female    DOB: 22-Aug-1945, 68 y.o.   MRN: [id number]  HPI Preventive Screening-Counseling & Management   Patient present here today for a subsequent Medicare annual wellness visit.   Current Problems (verified)   Medications Prior to Visit Allergies (verified)   PAST HISTORY  Family History (verified)   Social History Married for 49 years. Retired. 4 boys, 1 son and 1 daughter deceased   Risk Factors  Current exercise habits: Walk outside a few days a week and stays active with chores    Dietary issues discussed: Limits fried fatty foods and eats more fruits and vegetables and chicken fish and [place]    Cardiac risk factors: Mother had heart attack in her 25's. Pt has CHF   Depression Screen  (Note: if answer to either of the following is "Yes", a more complete depression screening is indicated)   Over the past two weeks, have you felt down, depressed or hopeless? Is overwhelmed by bills and has trouble affording meds. Makes her depressed  Over the past two weeks, have you felt little interest or pleasure in doing things? No  Have you lost interest or pleasure in daily life? No  Do you often feel hopeless? No  Do you cry easily over simple problems? Crys to herself sometimes   Activities of Daily Living  In your present state of health, do you have any difficulty performing the following activities?  Driving?:Has never drove  Managing money?: No Feeding yourself?:No Getting from bed to chair?:No Climbing a flight of stairs? If its not very many  Preparing food and eating?:No Bathing or showering?:No Getting dressed?:No Getting to the toilet?:No Using the toilet?:No Moving around from place to place?: No  Fall Risk Assessment In the past year have you fallen or had a near fall?:No Are you currently taking any medications that make you dizzy?:No   Hearing Difficulties: No Do you often ask people to speak up or  repeat themselves?:No Do you experience ringing or noises in your ears?:No Do you have difficulty understanding soft or whispered voices?:No  Cognitive Testing  Alert? Yes Normal Appearance?Yes  Oriented to person? Yes Place? Yes  Time? Yes  Displays appropriate judgment?Yes  Can read the correct time from a watch face? yes Are you having problems remembering things?No  Advanced Directives have been discussed with the patient? Yes, has a brochure and she has somemore information at home , full code   List the Names of Other Physician/Practitioners you currently use:  Dr Roxy Manns (cardiac and thoracic surgery) Dr Burt Knack (cardio)  Dr Dorris Fetch (endo) Dr Lamonte Sakai (Pulmonary)   Indicate any recent Medical Services you may have received from other than [facility] providers in the past year (date may be approximate).   Assessment:    Annual Wellness Exam   Plan:    .  Medicare Attestation  I have personally reviewed:  The patient's medical and social history  Their use of alcohol, tobacco or illicit drugs  Their current medications and supplements  The patient's functional ability including ADLs,fall risks, home safety risks, cognitive, and hearing and visual impairment  Diet and physical activities  Evidence for depression or mood disorders  The patient's weight, height, BMI, and visual acuity have been recorded in the chart. I have made referrals, counseling, and provided education to the patient based on review of the above and I have provided the patient with a written personalized care plan for preventive services.  Review of Systems     Objective:   Physical Exam        Assessment & Plan:  Medicare annual wellness visit, initial Annual exam as documented. Counseling done  re healthy lifestyle involving commitment to 150 minutes exercise per week, heart healthy diet, and attaining healthy weight.The importance of adequate sleep also discussed. Regular seat belt use  is also  discussed. Changes in health habits are decided on by the patient with goals and time frames  set for achieving them. Immunization and cancer screening needs are specifically addressed at this visit.   Need for prophylactic vaccination and inoculation against influenza Vaccine administered at visit.   Diabetes mellitus, insulin dependent (IDDM), uncontrolled Uncontrolled, pt having excessive financial challenge obtaining medcation. Also states she will not go back to endocrinology  After much back and forth, she has filled script for humalog, will start with low dose twide daily and she is to check next week with blood sugar  Values which need to be checked at least twice daily  Patient advised to reduce carb and sweets, commit to regular physical activity, take meds as prescribed, test blood as directed, and attempt to lose weight, to improve blood sugar control.

## 2014-04-23 NOTE — Telephone Encounter (Signed)
Called the pharmacy and her copay on the 50/50 is $90. He said she has to pay 3 copays because a box of 5 pens is a 75 day supply and insurance sees that as a 90 day supply. Patient wants to know why she can't just have a pill. I told her about a medicare subsity program and all she has to do to qualify was to call the number on the brochure and answer their questions but she was frustrated and didn't want to do it. Wants to know about a cheap pill. Please advise

## 2014-04-23 NOTE — Patient Instructions (Addendum)
F/u in 4 weeks , call if you need me before  Flu vaccine today   New for your blood sugar is humalog 50/50 says 10 units twice daily, start with 5 units twice daily  Test blood sugar three times every day and write it down  Bring numbers in on Sept 15 for review   All the best with heart surgery

## 2014-04-24 ENCOUNTER — Telehealth: Payer: Self-pay | Admitting: Family Medicine

## 2014-04-24 ENCOUNTER — Institutional Professional Consult (permissible substitution): Payer: Self-pay | Admitting: Emergency Medicine

## 2014-04-24 MED ORDER — GLIPIZIDE 5 MG PO TABS: 5.0000 mg | ORAL_TABLET | Freq: Every day | ORAL | Status: DC

## 2014-04-24 NOTE — Telephone Encounter (Signed)
Patient was able to buy the insulin that was sent in yesterday so she has a 75 day supply. Will call back when she gets low on this to find out what she needs to be switched to. Will d/c glipizide for now

## 2014-04-24 NOTE — Telephone Encounter (Signed)
Ps let her know I am starting at the lowest dose of glipizide once daily with breakfast, she needs to test sugars every morning before breakfast and provide you with the info next week. Explain since her kidneys do not work well the medcation will stay in her system longer than normal, she needs to eat on a regular schedule. Script is printed

## 2014-04-24 NOTE — Telephone Encounter (Signed)
See previous message regarding new change of plans

## 2014-04-27 DIAGNOSIS — Z0000 Encounter for general adult medical examination without abnormal findings: Secondary | ICD-10-CM | POA: Insufficient documentation

## 2014-04-27 DIAGNOSIS — Z23 Encounter for immunization: Secondary | ICD-10-CM | POA: Insufficient documentation

## 2014-04-27 NOTE — Assessment & Plan Note (Signed)
Vaccine administered at visit.  

## 2014-04-27 NOTE — Assessment & Plan Note (Signed)
Uncontrolled, pt having excessive financial challenge obtaining medcation. Also states she will not go back to endocrinology  After much back and forth, she has filled script for humalog, will start with low dose twide daily and she is to check next week with blood sugar  Values which need to be checked at least twice daily  Patient advised to reduce carb and sweets, commit to regular physical activity, take meds as prescribed, test blood as directed, and attempt to lose weight, to improve blood sugar control.

## 2014-04-27 NOTE — Assessment & Plan Note (Addendum)
Annual exam as documented. Counseling done  re healthy lifestyle involving commitment to 150 minutes exercise per week, heart healthy diet, and attaining healthy weight.The importance of adequate sleep also discussed. Regular seat belt use  is also discussed. Changes in health habits are decided on by the patient with goals and time frames  set for achieving them. Immunization and cancer screening needs are specifically addressed at this visit.

## 2014-04-28 ENCOUNTER — Ambulatory Visit: Payer: Medicare Other | Admitting: Thoracic Surgery (Cardiothoracic Vascular Surgery)

## 2014-04-29 ENCOUNTER — Encounter: Payer: Self-pay | Admitting: Cardiology

## 2014-04-29 ENCOUNTER — Ambulatory Visit (INDEPENDENT_AMBULATORY_CARE_PROVIDER_SITE_OTHER): Payer: Medicare Other | Admitting: Cardiology

## 2014-04-29 VITALS — BP 112/70 | HR 79 | Ht 66.0 in | Wt 176.0 lb

## 2014-04-29 DIAGNOSIS — I5022 Chronic systolic (congestive) heart failure: Secondary | ICD-10-CM

## 2014-04-29 DIAGNOSIS — N183 Chronic kidney disease, stage 3 unspecified: Secondary | ICD-10-CM

## 2014-04-29 DIAGNOSIS — I428 Other cardiomyopathies: Secondary | ICD-10-CM

## 2014-04-29 DIAGNOSIS — I350 Nonrheumatic aortic (valve) stenosis: Secondary | ICD-10-CM

## 2014-04-29 DIAGNOSIS — I359 Nonrheumatic aortic valve disorder, unspecified: Secondary | ICD-10-CM

## 2014-04-29 NOTE — Assessment & Plan Note (Signed)
Symptomatically stable with LVEF 15-20%. No major change in weight. Continue current regimen.

## 2014-04-29 NOTE — Assessment & Plan Note (Signed)
Last creatinine 1.7 in July.

## 2014-04-29 NOTE — Progress Notes (Signed)
Clinical Summary [name] [name] is a 68 y.o.female I last saw and May of this year. I referred her for subsequent evaluation of severe, low gradient aortic stenosis. She was evaluated by [name] [name] and [name] [name], plans are to pursue surgical AVR. I reviewed the notes. Surgical date has not yet been set. Patient does have concerns about her postoperative care. She states that she is the primary caregiver for her husband who also has health problems. She does have some children and grandchildren in town. She is also concerned about transportation for postoperative followup.  She has had no precipitous decline in symptoms. She will be seeing [name] [name] on the 28th of this month.   Allergies  Allergen Reactions  . Penicillins     Family unsure of reaction, but certain mom said she was allergic    Current Outpatient Prescriptions  Medication Sig Dispense Refill  . acetaminophen (TYLENOL) 500 MG tablet Take 1,000 mg by mouth every 6 (six) hours as needed.      Marland Kitchen aspirin EC 81 MG tablet Take 1 tablet (81 mg total) by mouth daily.  150 tablet  2  . atorvastatin (LIPITOR) 80 MG tablet TAKE ONE TABLET DAILY.  30 tablet  2  . carvedilol (COREG) 25 MG tablet Take 1 tablet (25 mg total) by mouth 2 (two) times daily.  60 tablet  6  . fenofibrate (TRICOR) 145 MG tablet TAKE ONE TABLET BY MOUTH ONCE DAILY.  30 tablet  4  . furosemide (LASIX) 40 MG tablet Take 1 tablet (40 mg total) by mouth every morning.  30 tablet  6  . Insulin Lispro Prot & Lispro (HUMALOG 50/50 MIX) (50-50) 100 UNIT/ML Kwikpen Inject 10 Units into the skin 2 (two) times daily.  15 mL  11  . Multiple Vitamin (MULITIVITAMIN WITH MINERALS) TABS Take 1 tablet by mouth every morning.      . niacin (NIASPAN) 1000 MG CR tablet Take 1 tablet (1,000 mg total) by mouth at bedtime.  30 tablet  4  . spironolactone (ALDACTONE) 25 MG tablet Take 1 tablet (25 mg total) by mouth every morning.  90 tablet  3  . [DISCONTINUED] cetirizine (ZYRTEC) 10 MG  tablet Take 1 tablet (10 mg total) by mouth daily.  30 tablet  3  . [DISCONTINUED] ferrous sulfate 325 (65 FE) MG EC tablet Take 1 tablet (325 mg total) by mouth 2 (two) times daily.  60 tablet  5  . [DISCONTINUED] FLUoxetine (PROZAC) 10 MG tablet Take 1 tablet (10 mg total) by mouth daily. Take one capsule by mouth once a day  30 tablet  3  . [DISCONTINUED] fluticasone (FLONASE) 50 MCG/ACT nasal spray Place 2 sprays into the nose daily.  16 g  3   No current facility-administered medications for this visit.    Past Medical History  Diagnosis Date  . Essential hypertension, benign   . Hyperlipidemia   . Anxiety   . Aortic stenosis   . History of pneumonia 10/2011  . Symptomatic PVCs   . Cardiomyopathy     LVEF of 20-25% on echo in 10/2011, has declined ICD  . Diabetes mellitus, type 2   . Arthritis   . CKD (chronic kidney disease) stage 3, GFR 30-59 ml/min   . Diabetes mellitus, insulin dependent (IDDM), uncontrolled     hBA1C is 8.1 in 11/2013   . Chronic systolic heart failure   . Obesity   . LBBB (left bundle branch block)   .  Depression   . Constipation   . Incidental pulmonary nodule, > 20mm and < 75mm     7 x 4 mm LLL nodule    Social History [name] [name] reports that she has never smoked. She has never used smokeless tobacco. [name] [name] reports that she does not drink alcohol.  Review of Systems Other systems reviewed and negative.  Physical Examination Filed Vitals:   04/29/14 1333  BP: 112/70  Pulse: 79   Filed Weights   04/29/14 1333  Weight: 176 lb (79.833 kg)    Overweight woman, in no distress  HEENT: Conjunctiva and lids normal, oropharynx clear.  Neck: Supple, no carotid bruits, no thyromegaly.  Lungs: Clear to auscultation, nonlabored breathing at rest.  Cardiac: Regular rate and rhythm, indistinct PMI, no S3, 3/6 harsh systolic murmur, no pericardial rub.  Abdomen: Soft, nontender, bowel sounds present, no guarding or rebound.  Extremities: Trace  edema, distal pulses 2+.  Skin: Warm and dry.  Musculoskeletal: No kyphosis.  Neuropsychiatric: Alert and oriented x3, affect grossly appropriate.   Problem List and Plan   Aortic stenosis Severe, low gradient aortic stenosis. Patient is to follow up with [name] [name] later this month to discuss proceeding with surgical AVR. No major change in symptoms at this time. Blood pressure and heart rate look good today.  Chronic systolic heart failure Symptomatically stable with LVEF 15-20%. No major change in weight. Continue current regimen.  CKD (chronic kidney disease) stage 3, GFR 30-59 ml/min Last creatinine 1.7 in July.  Nonischemic cardiomyopathy Patient had no significant CAD at catheterization in 2013, reportedly mild plaque in the mid RCA by chest CT (not gated study for coronary anatomy however). Uncertain if diagnostic coronary angiography will be requested prior to surgery.    [name] [name], M.D., F.A.C.C.

## 2014-04-29 NOTE — Patient Instructions (Signed)
Your physician recommends that you schedule a follow-up appointment in: 2 months    Your physician recommends that you continue on your current medications as directed. Please refer to the Current Medication list given to you today.     Thank you for choosing [facility] !         

## 2014-04-29 NOTE — Assessment & Plan Note (Addendum)
Severe, low gradient aortic stenosis. Patient is to follow up with Dr. Roxy Manns later this month to discuss proceeding with surgical AVR. No major change in symptoms at this time. Blood pressure and heart rate look good today.

## 2014-04-29 NOTE — Assessment & Plan Note (Signed)
Patient had no significant CAD at catheterization in 2013, reportedly mild plaque in the mid RCA by chest CT (not gated study for coronary anatomy however). Uncertain if diagnostic coronary angiography will be requested prior to surgery.

## 2014-05-12 ENCOUNTER — Encounter: Payer: Self-pay | Admitting: Thoracic Surgery (Cardiothoracic Vascular Surgery)

## 2014-05-12 ENCOUNTER — Ambulatory Visit (INDEPENDENT_AMBULATORY_CARE_PROVIDER_SITE_OTHER): Payer: Medicare Other | Admitting: Thoracic Surgery (Cardiothoracic Vascular Surgery)

## 2014-05-12 VITALS — BP 107/66 | HR 69 | Resp 16 | Ht 65.0 in | Wt 178.0 lb

## 2014-05-12 DIAGNOSIS — I350 Nonrheumatic aortic (valve) stenosis: Secondary | ICD-10-CM

## 2014-05-12 DIAGNOSIS — R911 Solitary pulmonary nodule: Secondary | ICD-10-CM

## 2014-05-12 DIAGNOSIS — I5042 Chronic combined systolic (congestive) and diastolic (congestive) heart failure: Secondary | ICD-10-CM

## 2014-05-12 DIAGNOSIS — I509 Heart failure, unspecified: Secondary | ICD-10-CM

## 2014-05-12 DIAGNOSIS — J9819 Other pulmonary collapse: Secondary | ICD-10-CM

## 2014-05-12 DIAGNOSIS — I359 Nonrheumatic aortic valve disorder, unspecified: Secondary | ICD-10-CM

## 2014-05-12 NOTE — Patient Instructions (Signed)
Schedule appointment to see Dr Lamonte Sakai as soon as possible to consider BRONCHOSCOPY  Schedule outpatient CARDIAC CATHETERIZATION with Dr Burt Knack

## 2014-05-12 NOTE — Progress Notes (Signed)
[address]       [place] [address]             [facility] OFFICE NOTE  Referring Provider is Sherren Mocha, MD Primary Cardiologist is [name], MD PCP is [name], MD   HPI:  Patient returns to the office today for followup of stage D severe symptomatic low gradient aortic stenosis with underlying severe left ventricular systolic dysfunction. She was originally seen in consultation on 01/29/2014.  Since then she underwent cardiac gated CT angiogram of the heart and dental extraction, and she was seen in followup here in our office on 03/03/2014. At that time the patient remained clinically stable from a cardiac standpoint and she did not want to consider proceeding with elective aortic valve replacement. In addition, she was found to have what may be a broncholith obstructing the right middle lobe bronchus with chronic right middle lobe collapse. She was referred to Dr. Lamonte Sakai to consider bronchoscopy, but the patient missed her appointment because her husband was hospitalized at the time. The appointment apparently was never rescheduled. Over the past 2 months the patient has been seen in followup both by Dr. Burt Knack and Dr. [name]. She now states that she is interested in proceeding with elective aortic valve replacement and she returns to the office today to discuss matters further.  The patient describes stable symptoms of exertional shortness of breath and fatigue consistent with chronic combined systolic and diastolic congestive heart failure, New York Heart Association functional class II.  Symptoms have not progressed to any significant degree over the last few months, although the patient states that she hasn't gotten better either. She denies any recent fevers chills or productive cough. She continues to struggle with concerns related to her husband's care and chronic medical problems. However, she seems to have  come to terms with the fact that she will ultimately need to undergo surgery.     Current Outpatient Prescriptions  Medication Sig Dispense Refill  . acetaminophen (TYLENOL) 500 MG tablet Take 1,000 mg by mouth every 6 (six) hours as needed.      Marland Kitchen aspirin EC 81 MG tablet Take 1 tablet (81 mg total) by mouth daily.  150 tablet  2  . atorvastatin (LIPITOR) 80 MG tablet TAKE ONE TABLET DAILY.  30 tablet  2  . carvedilol (COREG) 25 MG tablet Take 1 tablet (25 mg total) by mouth 2 (two) times daily.  60 tablet  6  . fenofibrate (TRICOR) 145 MG tablet TAKE ONE TABLET BY MOUTH ONCE DAILY.  30 tablet  4  . furosemide (LASIX) 40 MG tablet Take 1 tablet (40 mg total) by mouth every morning.  30 tablet  6  . Insulin Lispro Prot & Lispro (HUMALOG 50/50 MIX) (50-50) 100 UNIT/ML Kwikpen Inject 10 Units into the skin 2 (two) times daily.  15 mL  11  . Multiple Vitamin (MULITIVITAMIN WITH MINERALS) TABS Take 1 tablet by mouth every morning.      . niacin (NIASPAN) 1000 MG CR tablet Take 1 tablet (1,000 mg total) by mouth at bedtime.  30 tablet  4  . spironolactone (ALDACTONE) 25 MG tablet Take 1 tablet (25 mg total) by mouth every morning.  90 tablet  3  . [DISCONTINUED] cetirizine (ZYRTEC) 10 MG tablet Take 1 tablet (10 mg total) by mouth daily.  30 tablet  3  . [DISCONTINUED] ferrous sulfate 325 (65 FE) MG  EC tablet Take 1 tablet (325 mg total) by mouth 2 (two) times daily.  60 tablet  5  . [DISCONTINUED] FLUoxetine (PROZAC) 10 MG tablet Take 1 tablet (10 mg total) by mouth daily. Take one capsule by mouth once a day  30 tablet  3  . [DISCONTINUED] fluticasone (FLONASE) 50 MCG/ACT nasal spray Place 2 sprays into the nose daily.  16 g  3   No current facility-administered medications for this visit.      Physical Exam:   BP 107/66  Pulse 69  Resp 16  Ht 5\' 5"  (1.651 m)  Wt 178 lb (80.74 kg)  BMI 29.62 kg/m2  SpO2 98%  General:  Well-appearing  Chest:   clear  CV:   Regular rate and rhythm with  systolic murmur  Incisions:  n/a  Abdomen:  Soft nontender  Extremities:  Warm and well-perfused, no edema  Diagnostic Tests:  n/a   Impression:  The patient has stage D severe symptomatic low gradient aortic stenosis with underlying severe left ventricular systolic dysfunction. She originally presented 2 years ago with acute respiratory failure and pulmonary edema, but since then she has remained remarkably stable on chronic medical therapy. Recently the patient describes stable symptoms consistent with chronic combined systolic and diastolic congestive heart failure, New York Heart Association functional class II. However, recent echocardiograms suggest further deterioration left ventricular systolic function with simultaneous worsening of severity of aortic stenosis.  Risks associated with conventional surgery would only be moderately elevated, and the patient's aortic valve may be bicuspid.  As a result, I suspect that she would best be treated with conventional surgical aortic valve replacement rather than transcatheter aortic valve replacement.  She will need repeat left and right heart catheterization performed prior to surgery since her last catheterization was more than 2 years ago.   Incidentally discovered at the time of her CT angiogram is the presence of what appears to be a broncholith obstructing the right middle lobe bronchus with chronic right middle lobe collapse as well as a small benign-appearing nodule in the left lower lobe. Given her history of at least 2 episodes of pneumonia in the past, she needs flexible bronchoscopy performed.   Plan:  We will try to get the patient rescheduled to see Dr. Lamonte Sakai as soon as practical to consider flexible fiberoptic bronchoscopy. In addition, we will make arrangements for the patient undergo left and right heart catheterization. The patient will return in 4 weeks to review the results of these tests and potentially make final plans for  surgery. All of her questions been addressed.  I spent in excess of 30 minutes during the conduct of this office consultation and >50% of this time involved direct face-to-face encounter with the patient for counseling and/or coordination of their care.   Valentina Gu. Roxy Manns, MD 05/12/2014 1:29 PM

## 2014-05-16 ENCOUNTER — Encounter (HOSPITAL_COMMUNITY): Payer: Self-pay | Admitting: Pharmacy Technician

## 2014-05-20 NOTE — Addendum Note (Signed)
Addended by: Lauree Chandler D on: 05/20/2014 10:50 AM   Modules accepted: Orders

## 2014-05-21 ENCOUNTER — Ambulatory Visit (INDEPENDENT_AMBULATORY_CARE_PROVIDER_SITE_OTHER): Payer: Medicare Other | Admitting: Pulmonary Disease

## 2014-05-21 ENCOUNTER — Other Ambulatory Visit (INDEPENDENT_AMBULATORY_CARE_PROVIDER_SITE_OTHER): Payer: Medicare Other

## 2014-05-21 ENCOUNTER — Encounter: Payer: Self-pay | Admitting: Pulmonary Disease

## 2014-05-21 VITALS — BP 122/78 | HR 83 | Ht 66.0 in | Wt 178.4 lb

## 2014-05-21 DIAGNOSIS — R0600 Dyspnea, unspecified: Secondary | ICD-10-CM

## 2014-05-21 DIAGNOSIS — J9819 Other pulmonary collapse: Secondary | ICD-10-CM

## 2014-05-21 DIAGNOSIS — R911 Solitary pulmonary nodule: Secondary | ICD-10-CM

## 2014-05-21 LAB — CBC WITH DIFFERENTIAL/PLATELET
Basophils Absolute: 0.1 10*3/uL (ref 0.0–0.1)
Basophils Relative: 1.1 % (ref 0.0–3.0)
Eosinophils Absolute: 0.1 10*3/uL (ref 0.0–0.7)
Eosinophils Relative: 1.7 % (ref 0.0–5.0)
HCT: 35.4 % — ABNORMAL LOW (ref 36.0–46.0)
Hemoglobin: 11.5 g/dL — ABNORMAL LOW (ref 12.0–15.0)
Lymphocytes Relative: 42.1 % (ref 12.0–46.0)
Lymphs Abs: 2.1 10*3/uL (ref 0.7–4.0)
MCHC: 32.4 g/dL (ref 30.0–36.0)
MCV: 79.6 fl (ref 78.0–100.0)
Monocytes Absolute: 0.5 10*3/uL (ref 0.1–1.0)
Monocytes Relative: 11 % (ref 3.0–12.0)
Neutro Abs: 2.2 10*3/uL (ref 1.4–7.7)
Neutrophils Relative %: 44.1 % (ref 43.0–77.0)
Platelets: 245 10*3/uL (ref 150.0–400.0)
RBC: 4.45 Mil/uL (ref 3.87–5.11)
RDW: 17.5 % — ABNORMAL HIGH (ref 11.5–15.5)
WBC: 4.9 10*3/uL (ref 4.0–10.5)

## 2014-05-21 LAB — PROTIME-INR
INR: 1.1 ratio — ABNORMAL HIGH (ref 0.8–1.0)
Prothrombin Time: 12.3 s (ref 9.6–13.1)

## 2014-05-21 NOTE — Assessment & Plan Note (Signed)
She has what appears to be chronic collapse of the right middle lobe with an associated broncholith. I assume this is related to her episode of pneumonia back in 2013.  She does not have a symptoms of asthma or ABPA which are commonly associated with broncholith.    Plan: -full PFT -arrange bronchoscopy next week> will send BAL for fungal culture

## 2014-05-21 NOTE — Progress Notes (Signed)
Subjective:    Patient ID: [name] [name], female    DOB: Feb 06, 1946, 68 y.o.   MRN: [id number]  HPI  [name] [name] is here to see me today for evaluation of a possible broncholith in her right middle lobe.  She follows with Drs. Cooper and [place] for aortic stenosis and CHF.  They feel that she needs to have surgical replacement of her aortic valve.  She is to undergo a heart catheterization (right and left) this Friday (10/9).  Her last heart catheterization was on 2013 and showed: 1. No angiographic evidence of CAD  2. Severe LV systolic dysfunction secondary to non-ischemic cardiomyopathy  3. Normal filling pressures.  She has CHF with an EF of 15%.  She was initially referred to see my partner [name] [name] but because her husband was hospitalized so she missed the appointment.  She tells me that she rarely coughs and sometimes coughs up mucus.  This is not very frequent.  She had pneumonia 2013 in the right middle lobe and right lower lobe.  She has never been told that she had asthma or COPD.  She never smoked.  She tells me that she "gets tired" if she over-exerts herself, but she denies frank dyspnea.  She cleans her house and yard but denies dyspnea.     Past Medical History  Diagnosis Date  . Essential hypertension, benign   . Hyperlipidemia   . Anxiety   . Aortic stenosis   . History of pneumonia 10/2011  . Symptomatic PVCs   . Cardiomyopathy     LVEF of 20-25% on echo in 10/2011, has declined ICD  . Diabetes mellitus, type 2   . Arthritis   . CKD (chronic kidney disease) stage 3, GFR 30-59 ml/min   . Diabetes mellitus, insulin dependent (IDDM), uncontrolled     hBA1C is 8.1 in 11/2013   . Chronic systolic heart failure   . Obesity   . LBBB (left bundle branch block)   . Depression   . Constipation   . Incidental pulmonary nodule, > 51mm and < 37mm     7 x 4 mm LLL nodule     Family History  Problem Relation Age of Onset  . Heart disease Mother   . Heart attack  Mother 47  . Diabetes Mother   . Hypertension Mother   . Colon cancer Father 37  . Breast cancer Sister   . Diabetes Brother   . Hypertension Brother   . Stroke Brother      History   Social History  . Marital Status: Married    Spouse Name: N/A    Number of Children: 37  . Years of Education: N/A   Occupational History  . Retired    Social History Main Topics  . Smoking status: Never Smoker   . Smokeless tobacco: Never Used  . Alcohol Use: No  . Drug Use: No  . Sexual Activity: No   Other Topics Concern  . Not on file   Social History Narrative  . No narrative on file     Allergies  Allergen Reactions  . Penicillins     Family unsure of reaction, but certain mom said she was allergic     Outpatient Prescriptions Prior to Visit  Medication Sig Dispense Refill  . acetaminophen (TYLENOL) 500 MG tablet Take 1,000 mg by mouth every 6 (six) hours as needed for mild pain, fever or headache.       Marland Kitchen aspirin EC 81  MG tablet Take 1 tablet (81 mg total) by mouth daily.  150 tablet  2  . atorvastatin (LIPITOR) 80 MG tablet TAKE ONE TABLET DAILY.  30 tablet  2  . carvedilol (COREG) 25 MG tablet Take 1 tablet (25 mg total) by mouth 2 (two) times daily.  60 tablet  6  . fenofibrate (TRICOR) 145 MG tablet TAKE ONE TABLET BY MOUTH ONCE DAILY.  30 tablet  4  . furosemide (LASIX) 40 MG tablet Take 1 tablet (40 mg total) by mouth every morning.  30 tablet  6  . Insulin Lispro Prot & Lispro (HUMALOG 50/50 MIX) (50-50) 100 UNIT/ML Kwikpen Inject 10 Units into the skin 2 (two) times daily.  15 mL  11  . Multiple Vitamin (MULITIVITAMIN WITH MINERALS) TABS Take 1 tablet by mouth every morning.      . niacin (NIASPAN) 1000 MG CR tablet Take 1 tablet (1,000 mg total) by mouth at bedtime.  30 tablet  4  . spironolactone (ALDACTONE) 25 MG tablet Take 1 tablet (25 mg total) by mouth every morning.  90 tablet  3   No facility-administered medications prior to visit.      Review of Systems   Constitutional: Negative for fever and unexpected weight change.  HENT: Negative for congestion, dental problem, ear pain, nosebleeds, postnasal drip, rhinorrhea, sinus pressure, sneezing, sore throat and trouble swallowing.   Eyes: Negative for redness and itching.  Respiratory: Negative for cough, chest tightness, shortness of breath and wheezing.   Cardiovascular: Positive for palpitations. Negative for leg swelling.  Gastrointestinal: Negative for nausea and vomiting.  Genitourinary: Negative for dysuria.  Musculoskeletal: Negative for joint swelling.  Skin: Negative for rash.  Neurological: Negative for headaches.  Hematological: Does not bruise/bleed easily.  Psychiatric/Behavioral: Negative for dysphoric mood. The patient is not nervous/anxious.        Objective:   Physical Exam Filed Vitals:   05/21/14 1442  BP: 122/78  Pulse: 83  Height: 5\' 6"  (1.676 m)  Weight: 178 lb 6.4 oz (80.922 kg)  SpO2: 97%   Gen: chronically ill appearing, no acute distress HEENT: NCAT, PERRL, EOMi, OP clear, neck supple without masses PULM: CTA B CV: RRR, loud aortic murmur, no JVD AB: BS+, soft, nontender, no hsm Ext: warm, trace edema, no clubbing, no cyanosis Derm: no rash or skin breakdown Neuro: A&Ox4, CN II-XII intact, strength 5/5 in all 4 extremities  02/10/2014 CT chest reviewed> calcified broncholith RML orifice with RML collapse; 7x86mm nodule RLL    Assessment & Plan:   Right middle lobe syndrome She has what appears to be chronic collapse of the right middle lobe with an associated broncholith. I assume this is related to her episode of pneumonia back in 2013.  She does not have a symptoms of asthma or ABPA which are commonly associated with broncholith.    Plan: -full PFT -arrange bronchoscopy next week> will send BAL for fungal culture  Incidental pulmonary nodule, > 39mm and < 64mm She needs a repeat CT scan in December 2015. She is low risk as she has no personal history  of malignancy and has never smoked.   Updated Medication List Outpatient Encounter Prescriptions as of 05/21/2014  Medication Sig  . acetaminophen (TYLENOL) 500 MG tablet Take 1,000 mg by mouth every 6 (six) hours as needed for mild pain, fever or headache.   Marland Kitchen aspirin EC 81 MG tablet Take 1 tablet (81 mg total) by mouth daily.  Marland Kitchen atorvastatin (LIPITOR) 80 MG tablet  TAKE ONE TABLET DAILY.  . carvedilol (COREG) 25 MG tablet Take 1 tablet (25 mg total) by mouth 2 (two) times daily.  . fenofibrate (TRICOR) 145 MG tablet TAKE ONE TABLET BY MOUTH ONCE DAILY.  . furosemide (LASIX) 40 MG tablet Take 1 tablet (40 mg total) by mouth every morning.  . Insulin Lispro Prot & Lispro (HUMALOG 50/50 MIX) (50-50) 100 UNIT/ML Kwikpen Inject 10 Units into the skin 2 (two) times daily.  . Multiple Vitamin (MULITIVITAMIN WITH MINERALS) TABS Take 1 tablet by mouth every morning.  . niacin (NIASPAN) 1000 MG CR tablet Take 1 tablet (1,000 mg total) by mouth at bedtime.  Marland Kitchen spironolactone (ALDACTONE) 25 MG tablet Take 1 tablet (25 mg total) by mouth every morning.

## 2014-05-21 NOTE — Assessment & Plan Note (Signed)
She needs a repeat CT scan in December 2015. She is low risk as she has no personal history of malignancy and has never smoked.

## 2014-05-21 NOTE — Patient Instructions (Signed)
We will make arrangements for a bronchoscopy next Friday morning at [facility] Don't eat breakfast the morning of the procedure Stop taking your Aspirin on Wednesday of next week, you can start taking it again after the bronchoscopy

## 2014-05-22 ENCOUNTER — Ambulatory Visit: Payer: Medicare Other | Admitting: Family Medicine

## 2014-05-23 ENCOUNTER — Encounter (HOSPITAL_COMMUNITY)
Admission: RE | Disposition: A | Discharge status: Home / Self Care | Payer: Self-pay | Source: Ambulatory Visit | Attending: Cardiovascular Disease

## 2014-05-23 ENCOUNTER — Ambulatory Visit (HOSPITAL_COMMUNITY)
Admission: RE | Admit: 2014-05-23 | Discharge: 2014-05-24 | Disposition: A | Discharge status: Home / Self Care | Payer: Medicare Other | Source: Ambulatory Visit | Attending: Cardiovascular Disease | Admitting: Cardiovascular Disease

## 2014-05-23 ENCOUNTER — Encounter (HOSPITAL_COMMUNITY): Payer: Self-pay | Admitting: General Practice

## 2014-05-23 DIAGNOSIS — E1122 Type 2 diabetes mellitus with diabetic chronic kidney disease: Secondary | ICD-10-CM | POA: Diagnosis present

## 2014-05-23 DIAGNOSIS — Z6826 Body mass index (BMI) 26.0-26.9, adult: Secondary | ICD-10-CM | POA: Diagnosis present

## 2014-05-23 DIAGNOSIS — N184 Chronic kidney disease, stage 4 (severe): Secondary | ICD-10-CM

## 2014-05-23 DIAGNOSIS — N289 Disorder of kidney and ureter, unspecified: Secondary | ICD-10-CM | POA: Insufficient documentation

## 2014-05-23 DIAGNOSIS — E1121 Type 2 diabetes mellitus with diabetic nephropathy: Secondary | ICD-10-CM | POA: Diagnosis present

## 2014-05-23 DIAGNOSIS — I350 Nonrheumatic aortic (valve) stenosis: Secondary | ICD-10-CM

## 2014-05-23 DIAGNOSIS — I129 Hypertensive chronic kidney disease with stage 1 through stage 4 chronic kidney disease, or unspecified chronic kidney disease: Secondary | ICD-10-CM | POA: Diagnosis not present

## 2014-05-23 DIAGNOSIS — Z79899 Other long term (current) drug therapy: Secondary | ICD-10-CM | POA: Insufficient documentation

## 2014-05-23 DIAGNOSIS — E1165 Type 2 diabetes mellitus with hyperglycemia: Secondary | ICD-10-CM | POA: Diagnosis not present

## 2014-05-23 DIAGNOSIS — E663 Overweight: Secondary | ICD-10-CM | POA: Diagnosis present

## 2014-05-23 DIAGNOSIS — E66811 Obesity, class 1: Secondary | ICD-10-CM | POA: Diagnosis present

## 2014-05-23 DIAGNOSIS — I255 Ischemic cardiomyopathy: Secondary | ICD-10-CM | POA: Insufficient documentation

## 2014-05-23 DIAGNOSIS — E669 Obesity, unspecified: Secondary | ICD-10-CM | POA: Insufficient documentation

## 2014-05-23 DIAGNOSIS — Z7982 Long term (current) use of aspirin: Secondary | ICD-10-CM | POA: Diagnosis not present

## 2014-05-23 DIAGNOSIS — I429 Cardiomyopathy, unspecified: Secondary | ICD-10-CM

## 2014-05-23 DIAGNOSIS — Z794 Long term (current) use of insulin: Secondary | ICD-10-CM | POA: Diagnosis not present

## 2014-05-23 DIAGNOSIS — Z6828 Body mass index (BMI) 28.0-28.9, adult: Secondary | ICD-10-CM | POA: Diagnosis not present

## 2014-05-23 DIAGNOSIS — I5042 Chronic combined systolic (congestive) and diastolic (congestive) heart failure: Secondary | ICD-10-CM | POA: Diagnosis present

## 2014-05-23 DIAGNOSIS — E785 Hyperlipidemia, unspecified: Secondary | ICD-10-CM | POA: Diagnosis not present

## 2014-05-23 DIAGNOSIS — E119 Type 2 diabetes mellitus without complications: Secondary | ICD-10-CM | POA: Diagnosis present

## 2014-05-23 DIAGNOSIS — I447 Left bundle-branch block, unspecified: Secondary | ICD-10-CM | POA: Diagnosis present

## 2014-05-23 DIAGNOSIS — N183 Chronic kidney disease, stage 3 unspecified: Secondary | ICD-10-CM

## 2014-05-23 DIAGNOSIS — I5022 Chronic systolic (congestive) heart failure: Secondary | ICD-10-CM

## 2014-05-23 DIAGNOSIS — I10 Essential (primary) hypertension: Secondary | ICD-10-CM | POA: Diagnosis present

## 2014-05-23 HISTORY — PX: LEFT AND RIGHT HEART CATHETERIZATION WITH CORONARY ANGIOGRAM: SHX5449

## 2014-05-23 LAB — POCT I-STAT 3, ART BLOOD GAS (G3+)
Acid-base deficit: 4 mmol/L — ABNORMAL HIGH (ref 0.0–2.0)
Bicarbonate: 21.1 mEq/L (ref 20.0–24.0)
O2 Saturation: 97 %
TCO2: 22 mmol/L (ref 0–100)
pCO2 arterial: 40 mmHg (ref 35.0–45.0)
pH, Arterial: 7.331 — ABNORMAL LOW (ref 7.350–7.450)
pO2, Arterial: 102 mmHg — ABNORMAL HIGH (ref 80.0–100.0)

## 2014-05-23 LAB — POCT I-STAT 3, VENOUS BLOOD GAS (G3P V)
Acid-base deficit: 2 mmol/L (ref 0.0–2.0)
Bicarbonate: 23.6 mEq/L (ref 20.0–24.0)
O2 Saturation: 67 %
TCO2: 25 mmol/L (ref 0–100)
pCO2, Ven: 40.4 mmHg — ABNORMAL LOW (ref 45.0–50.0)
pH, Ven: 7.374 — ABNORMAL HIGH (ref 7.250–7.300)
pO2, Ven: 36 mmHg (ref 30.0–45.0)

## 2014-05-23 LAB — MRSA PCR SCREENING: MRSA by PCR: NEGATIVE

## 2014-05-23 LAB — BASIC METABOLIC PANEL
Anion gap: 12 (ref 5–15)
BUN: 32 mg/dL — ABNORMAL HIGH (ref 6–23)
CO2: 24 mEq/L (ref 19–32)
Calcium: 9.7 mg/dL (ref 8.4–10.5)
Chloride: 104 mEq/L (ref 96–112)
Creatinine, Ser: 1.51 mg/dL — ABNORMAL HIGH (ref 0.50–1.10)
GFR calc Af Amer: 40 mL/min — ABNORMAL LOW (ref >90)
GFR calc non Af Amer: 34 mL/min — ABNORMAL LOW (ref >90)
Glucose, Bld: 223 mg/dL — ABNORMAL HIGH (ref 70–99)
Potassium: 4 mEq/L (ref 3.7–5.3)
Sodium: 140 mEq/L (ref 137–147)

## 2014-05-23 LAB — GLUCOSE, CAPILLARY
Glucose-Capillary: 221 mg/dL — ABNORMAL HIGH (ref 70–99)
Glucose-Capillary: 168 mg/dL — ABNORMAL HIGH (ref 70–99)
Glucose-Capillary: 205 mg/dL — ABNORMAL HIGH (ref 70–99)
Glucose-Capillary: 143 mg/dL — ABNORMAL HIGH (ref 70–99)

## 2014-05-23 SURGERY — LEFT AND RIGHT HEART CATHETERIZATION WITH CORONARY ANGIOGRAM
Anesthesia: LOCAL

## 2014-05-23 MED ORDER — SPIRONOLACTONE 25 MG PO TABS
25.0000 mg | ORAL_TABLET | Freq: Every morning | ORAL | Status: DC
  Administered 2014-05-23: 15:00:00 25 mg via ORAL
  Filled 2014-05-23 (×2): qty 1

## 2014-05-23 MED ORDER — ONDANSETRON HCL 4 MG/2ML IJ SOLN: 4.0000 mg | Freq: Four times a day (QID) | INTRAMUSCULAR | Status: DC | PRN

## 2014-05-23 MED ORDER — SODIUM CHLORIDE 0.9 % IJ SOLN: 3.0000 mL | Freq: Two times a day (BID) | INTRAMUSCULAR | Status: DC

## 2014-05-23 MED ORDER — ASPIRIN EC 81 MG PO TBEC
81.0000 mg | DELAYED_RELEASE_TABLET | Freq: Every day | ORAL | Status: DC
  Filled 2014-05-23: qty 1

## 2014-05-23 MED ORDER — FUROSEMIDE 40 MG PO TABS
40.0000 mg | ORAL_TABLET | Freq: Every morning | ORAL | Status: DC
  Administered 2014-05-23: 40 mg via ORAL
  Filled 2014-05-23 (×2): qty 1

## 2014-05-23 MED ORDER — SODIUM CHLORIDE 0.9 % IV SOLN: 250.0000 mL | INTRAVENOUS | Status: DC | PRN

## 2014-05-23 MED ORDER — HEPARIN (PORCINE) IN NACL 2-0.9 UNIT/ML-% IJ SOLN
INTRAMUSCULAR | Status: AC
  Filled 2014-05-23: qty 1000

## 2014-05-23 MED ORDER — FENTANYL CITRATE 0.05 MG/ML IJ SOLN
INTRAMUSCULAR | Status: AC
  Filled 2014-05-23: qty 2

## 2014-05-23 MED ORDER — MIDAZOLAM HCL 2 MG/2ML IJ SOLN
INTRAMUSCULAR | Status: AC
  Filled 2014-05-23: qty 2

## 2014-05-23 MED ORDER — INSULIN LISPRO PROT & LISPRO (50-50 MIX) 100 UNIT/ML KWIKPEN: 10.0000 [IU] | PEN_INJECTOR | Freq: Two times a day (BID) | SUBCUTANEOUS | Status: DC

## 2014-05-23 MED ORDER — ASPIRIN 81 MG PO CHEW: 81.0000 mg | CHEWABLE_TABLET | ORAL | Status: DC

## 2014-05-23 MED ORDER — CARVEDILOL 25 MG PO TABS
25.0000 mg | ORAL_TABLET | Freq: Two times a day (BID) | ORAL | Status: DC
  Administered 2014-05-23: 18:00:00 25 mg via ORAL
  Filled 2014-05-23: qty 1
  Filled 2014-05-23: qty 2
  Filled 2014-05-23 (×3): qty 1

## 2014-05-23 MED ORDER — ATORVASTATIN CALCIUM 80 MG PO TABS
80.0000 mg | ORAL_TABLET | Freq: Every day | ORAL | Status: DC
  Administered 2014-05-23: 80 mg via ORAL
  Filled 2014-05-23 (×2): qty 1

## 2014-05-23 MED ORDER — LIDOCAINE HCL (PF) 1 % IJ SOLN
INTRAMUSCULAR | Status: AC
  Filled 2014-05-23: qty 30

## 2014-05-23 MED ORDER — FENOFIBRATE 160 MG PO TABS
160.0000 mg | ORAL_TABLET | Freq: Every day | ORAL | Status: DC
  Administered 2014-05-23: 15:00:00 160 mg via ORAL
  Filled 2014-05-23 (×2): qty 1

## 2014-05-23 MED ORDER — INSULIN ASPART PROT & ASPART (70-30 MIX) 100 UNIT/ML ~~LOC~~ SUSP
10.0000 [IU] | Freq: Two times a day (BID) | SUBCUTANEOUS | Status: DC
  Administered 2014-05-23 – 2014-05-24 (×2): 10 [IU] via SUBCUTANEOUS
  Filled 2014-05-23: qty 10

## 2014-05-23 MED ORDER — NITROGLYCERIN 1 MG/10 ML FOR IR/CATH LAB
INTRA_ARTERIAL | Status: AC
  Filled 2014-05-23: qty 10

## 2014-05-23 MED ORDER — ACETAMINOPHEN 325 MG PO TABS: 650.0000 mg | ORAL_TABLET | ORAL | Status: DC | PRN

## 2014-05-23 MED ORDER — SODIUM CHLORIDE 0.9 % IV SOLN
INTRAVENOUS | Status: DC
  Administered 2014-05-23: 09:00:00 via INTRAVENOUS

## 2014-05-23 MED ORDER — SODIUM CHLORIDE 0.9 % IJ SOLN: 3.0000 mL | INTRAMUSCULAR | Status: DC | PRN

## 2014-05-23 MED ORDER — SODIUM CHLORIDE 0.9 % IV SOLN
INTRAVENOUS | Status: AC
  Administered 2014-05-23: 13:00:00 via INTRAVENOUS

## 2014-05-23 NOTE — Interval H&P Note (Signed)
History and Physical Interval Note:  05/23/2014 11:12 AM  [name] [name]  has presented today for cardiac cath with the diagnosis of severe aortic stenosis  The various methods of treatment have been discussed with the patient and family. After consideration of risks, benefits and other options for treatment, the patient has consented to  Procedure(s): LEFT AND RIGHT HEART CATHETERIZATION WITH CORONARY ANGIOGRAM (N/A) as a surgical intervention .  The patient's history has been reviewed, patient examined, no change in status, stable for surgery.  I have reviewed the patient's chart and labs.  Questions were answered to the patient's satisfaction.    Cath Lab Visit (complete for each Cath Lab visit)  Clinical Evaluation Leading to the Procedure:   ACS: No.  Non-ACS:    Anginal Classification: No Symptoms  Anti-ischemic medical therapy: No Therapy  Non-Invasive Test Results: No non-invasive testing performed  Prior CABG: No previous CABG        MCALHANY,CHRISTOPHER

## 2014-05-23 NOTE — Progress Notes (Signed)
Site area: right groin 5 french arterial and a 7 french venous sheath was removed  Site Prior to Removal:  Level 0  Pressure Applied For 20 MINUTES    Minutes Beginning at 1240p  Manual:   Yes.    Patient Status During Pull:  stable  Post Pull Groin Site:  Level 0  Post Pull Instructions Given:  Yes.    Post Pull Pulses Present:  Yes.    Dressing Applied:  Yes.    Comments:  VS remain stable during sheath pull. Pt denies any discomfort at site at this time.

## 2014-05-23 NOTE — CV Procedure (Signed)
      Cardiac Catheterization Operative Report  [name] [name] [id number] 10/9/201511:21 AM [name], MD  Procedure Performed:  1. Left Heart Catheterization 2. Selective Coronary Angiography 3. Right Heart Catheterization  Operator: Lauree Chandler, MD  Indication: 68 yo female with history of severe AS, HTN, HLD, non-ischemic cardiomyopathy with plans for AVR. Here today for right and left heart cath.                                 Procedure Details: The risks, benefits, complications, treatment options, and expected outcomes were discussed with the patient. The patient and/or family concurred with the proposed plan, giving informed consent. The patient was brought to the cath lab after IV hydration was begun and oral premedication was given. The patient was further sedated with Versed and Fentanyl. The right groin was prepped and draped in the usual manner. Using the modified Seldinger access technique, a 5 French sheath was placed in the right femoral artery. A 7 French sheath was placed in the right femoral vein. A balloon tipped catheter was used to perform a right heart catheterization. Standard diagnostic catheters were used to perform selective coronary angiography. A pigtail catheter was used to cross the aortic valve. LV pressures measured. No LV gram. There were no immediate complications. The patient was taken to the recovery area in stable condition.   Hemodynamic Findings: Ao:   137/72            LV: 164/15/21 RA:  11            RV: 50/13/13 PA: 43/17 (mean 27)      PCWP: 25 Fick Cardiac Output: 5.3 L/min Fick Cardiac Index: 2.84 L/min/m2 Central Aortic Saturation: 97% Pulmonary Artery Saturation: 67%  Aortic valve Data:  27 mmHg peak to peak gradient 15.8 mm HG mean gradient AVA calculated 1.4 cm2  Angiographic Findings:  Left main: No evidence of disease.   Left Anterior Descending Artery: Large caliber vessel that courses to the apex. Large  caliber diagonal branch. No evidence of disease.   Circumflex Artery: Large caliber vessel with large obtuse marginal branch. No evidence of disease.   Right Coronary Artery: Moderate sized dominant vessel. No evidence of disease.   Impression: 1. No angiographic evidence of CAD 2. Aortic stenosis, severe by dobutamine echo  Recommendations: Planning in place for aortic valve replacement. Pt will be admitted overnight for hydration post cath given renal insufficiency.        Complications:  None; patient tolerated the procedure well.

## 2014-05-23 NOTE — Care Management Note (Addendum)
  Page 1 of 1   05/23/2014     4:18:46 PM CARE MANAGEMENT NOTE 05/23/2014  Patient:  [name] [name], [name] [name]   Account Number:  [id number]  Date Initiated:  05/23/2014  Documentation initiated by:  Daquane Aguilar  Subjective/Objective Assessment:   LEFT AND RIGHT HEART CATHETERIZATION WITH CORONARY ANGIOGRAM     Action/Plan:   CM to follow for disposition needs   Anticipated DC Date:  05/24/2014   Anticipated DC Plan:  HOME/SELF CARE         Choice offered to / List presented to:             Status of service:  Completed, signed off Medicare Important Message given?   (If response is "NO", the following Medicare IM given date fields will be blank) Date Medicare IM given:   Medicare IM given by:   Date Additional Medicare IM given:   Additional Medicare IM given by:    Discharge Disposition:  HOME/SELF CARE  Per UR Regulation:    If discussed at Long Length of Stay Meetings, dates discussed:    Comments:  Alyas Creary RN, BSN, MSHL, CCM  Nurse - Case Manager,  (Unit [phone]  05/23/2014 Med review:  no needs identified Dispo Plan:  Home / Self care.

## 2014-05-23 NOTE — H&P (View-Only) (Signed)
Clinical Summary Ms. Tahara is a 68 y.o.female I last saw and May of this year. I referred her for subsequent evaluation of severe, low gradient aortic stenosis. She was evaluated by Dr. Burt Knack and Dr. Roxy Manns, plans are to pursue surgical AVR. I reviewed the notes. Surgical date has not yet been set. Patient does have concerns about her postoperative care. She states that she is the primary caregiver for her husband who also has health problems. She does have some children and grandchildren in town. She is also concerned about transportation for postoperative followup.  She has had no precipitous decline in symptoms. She will be seeing Dr. Roxy Manns on the 28th of this month.   Allergies  Allergen Reactions  . Penicillins     Family unsure of reaction, but certain mom said she was allergic    Current Outpatient Prescriptions  Medication Sig Dispense Refill  . acetaminophen (TYLENOL) 500 MG tablet Take 1,000 mg by mouth every 6 (six) hours as needed.      Marland Kitchen aspirin EC 81 MG tablet Take 1 tablet (81 mg total) by mouth daily.  150 tablet  2  . atorvastatin (LIPITOR) 80 MG tablet TAKE ONE TABLET DAILY.  30 tablet  2  . carvedilol (COREG) 25 MG tablet Take 1 tablet (25 mg total) by mouth 2 (two) times daily.  60 tablet  6  . fenofibrate (TRICOR) 145 MG tablet TAKE ONE TABLET BY MOUTH ONCE DAILY.  30 tablet  4  . furosemide (LASIX) 40 MG tablet Take 1 tablet (40 mg total) by mouth every morning.  30 tablet  6  . Insulin Lispro Prot & Lispro (HUMALOG 50/50 MIX) (50-50) 100 UNIT/ML Kwikpen Inject 10 Units into the skin 2 (two) times daily.  15 mL  11  . Multiple Vitamin (MULITIVITAMIN WITH MINERALS) TABS Take 1 tablet by mouth every morning.      . niacin (NIASPAN) 1000 MG CR tablet Take 1 tablet (1,000 mg total) by mouth at bedtime.  30 tablet  4  . spironolactone (ALDACTONE) 25 MG tablet Take 1 tablet (25 mg total) by mouth every morning.  90 tablet  3  . [DISCONTINUED] cetirizine (ZYRTEC) 10 MG  tablet Take 1 tablet (10 mg total) by mouth daily.  30 tablet  3  . [DISCONTINUED] ferrous sulfate 325 (65 FE) MG EC tablet Take 1 tablet (325 mg total) by mouth 2 (two) times daily.  60 tablet  5  . [DISCONTINUED] FLUoxetine (PROZAC) 10 MG tablet Take 1 tablet (10 mg total) by mouth daily. Take one capsule by mouth once a day  30 tablet  3  . [DISCONTINUED] fluticasone (FLONASE) 50 MCG/ACT nasal spray Place 2 sprays into the nose daily.  16 g  3   No current facility-administered medications for this visit.    Past Medical History  Diagnosis Date  . Essential hypertension, benign   . Hyperlipidemia   . Anxiety   . Aortic stenosis   . History of pneumonia 10/2011  . Symptomatic PVCs   . Cardiomyopathy     LVEF of 20-25% on echo in 10/2011, has declined ICD  . Diabetes mellitus, type 2   . Arthritis   . CKD (chronic kidney disease) stage 3, GFR 30-59 ml/min   . Diabetes mellitus, insulin dependent (IDDM), uncontrolled     hBA1C is 8.1 in 11/2013   . Chronic systolic heart failure   . Obesity   . LBBB (left bundle branch block)   .  Depression   . Constipation   . Incidental pulmonary nodule, > 35mm and < 64mm     7 x 4 mm LLL nodule    Social History Ms. Thornock reports that she has never smoked. She has never used smokeless tobacco. Ms. Freeberg reports that she does not drink alcohol.  Review of Systems Other systems reviewed and negative.  Physical Examination Filed Vitals:   04/29/14 1333  BP: 112/70  Pulse: 79   Filed Weights   04/29/14 1333  Weight: 176 lb (79.833 kg)    Overweight woman, in no distress  HEENT: Conjunctiva and lids normal, oropharynx clear.  Neck: Supple, no carotid bruits, no thyromegaly.  Lungs: Clear to auscultation, nonlabored breathing at rest.  Cardiac: Regular rate and rhythm, indistinct PMI, no S3, 3/6 harsh systolic murmur, no pericardial rub.  Abdomen: Soft, nontender, bowel sounds present, no guarding or rebound.  Extremities: Trace  edema, distal pulses 2+.  Skin: Warm and dry.  Musculoskeletal: No kyphosis.  Neuropsychiatric: Alert and oriented x3, affect grossly appropriate.   Problem List and Plan   Aortic stenosis Severe, low gradient aortic stenosis. Patient is to follow up with Dr. Roxy Manns later this month to discuss proceeding with surgical AVR. No major change in symptoms at this time. Blood pressure and heart rate look good today.  Chronic systolic heart failure Symptomatically stable with LVEF 15-20%. No major change in weight. Continue current regimen.  CKD (chronic kidney disease) stage 3, GFR 30-59 ml/min Last creatinine 1.7 in July.  Nonischemic cardiomyopathy Patient had no significant CAD at catheterization in 2013, reportedly mild plaque in the mid RCA by chest CT (not gated study for coronary anatomy however). Uncertain if diagnostic coronary angiography will be requested prior to surgery.    [name], M.D., F.A.C.C.

## 2014-05-24 DIAGNOSIS — I350 Nonrheumatic aortic (valve) stenosis: Secondary | ICD-10-CM | POA: Diagnosis not present

## 2014-05-24 DIAGNOSIS — E785 Hyperlipidemia, unspecified: Secondary | ICD-10-CM

## 2014-05-24 DIAGNOSIS — N183 Chronic kidney disease, stage 3 (moderate): Secondary | ICD-10-CM

## 2014-05-24 DIAGNOSIS — I5022 Chronic systolic (congestive) heart failure: Secondary | ICD-10-CM

## 2014-05-24 LAB — BASIC METABOLIC PANEL
Anion gap: 14 (ref 5–15)
BUN: 26 mg/dL — ABNORMAL HIGH (ref 6–23)
CO2: 23 mEq/L (ref 19–32)
Calcium: 9.6 mg/dL (ref 8.4–10.5)
Chloride: 103 mEq/L (ref 96–112)
Creatinine, Ser: 1.55 mg/dL — ABNORMAL HIGH (ref 0.50–1.10)
GFR calc Af Amer: 39 mL/min — ABNORMAL LOW (ref >90)
GFR calc non Af Amer: 33 mL/min — ABNORMAL LOW (ref >90)
Glucose, Bld: 150 mg/dL — ABNORMAL HIGH (ref 70–99)
Potassium: 3.9 mEq/L (ref 3.7–5.3)
Sodium: 140 mEq/L (ref 137–147)

## 2014-05-24 LAB — GLUCOSE, CAPILLARY: Glucose-Capillary: 126 mg/dL — ABNORMAL HIGH (ref 70–99)

## 2014-05-24 NOTE — Discharge Summary (Signed)
Discharge Summary   Patient ID: [name] [name] MRN: [id number], DOB/AGE: Oct 15, 1945 68 y.o. Admit date: 05/23/2014 D/C date:     05/24/2014  Primary Cardiologist: Dr. [name]    Principal Problem:   Severe aortic stenosis Active Problems:   Diabetes mellitus, insulin dependent (IDDM), uncontrolled   Obesity   Essential hypertension, benign   CKD (chronic kidney disease) stage 3, GFR 30-59 ml/min   LBBB (left bundle branch block)   Chronic combined systolic and diastolic CHF (congestive heart failure)   HLD (hyperlipidemia)   Admission Dates: 05/23/14- 05/24/14 Discharge Diagnosis: Severe AS s/p [facility] with no angiographic evidence of CAD. Kept overnight for hydration due to renal insufficiency.     HPI: [name] [name] is a 68 y.o. female with a history of AS, NICM/chronic systolic CHF and CKD who presented to [facility] on 05/23/14 for elective cardiac cath as a part of a work up for possible AVR with Dr. Roxy Manns.    Hospital Course  Aortic stenosis -severe by dobutamine ECHO.  -- Undergoing pre-operative work up for AVR  -- s/p LHC yesterday with no angiographic evidence of CAD.   Chronic systolic heart failure -Symptomatically stable with LVEF 15-20%.  -- Continue current regimen.   CKD (chronic kidney disease) stage 3, GFR 30-59 ml/min  -- Creat 1.5 on admission. Kept overnight for hydration after LHC.  -- Repeat BMET after hydration with stable creat at 1.55.  Nonischemic cardiomyopathy  -- Patient had no significant CAD at catheterization in 2013, reportedly mild plaque in the mid RCA by chest CT (not gated study for coronary anatomy however). LHC on this admission with no angiographic evidence of CAD    The patient has had an uncomplicated hospital course and is recovering well. The  femoral catheter site is stable. She has been seen by Dr. [name] today and deemed ready for discharge home. A voicemail was left with the office to call and make an appointment in the next  month. Discharge medications are listed below.    Discharge Vitals: Blood pressure 104/58, pulse 75, temperature 98.1 F (36.7 C), temperature source Oral, resp. rate 20, height 5\' 6"  (1.676 m), weight 174 lb 2.6 oz (79 kg), SpO2 100.00%.  Labs: Lab Results  Component Value Date   WBC 4.9 05/21/2014   HGB 11.5* 05/21/2014   HCT 35.4* 05/21/2014   MCV 79.6 05/21/2014   PLT 245.0 05/21/2014     Recent Labs Lab 05/24/14 0804  NA 140  K 3.9  CL 103  CO2 23  BUN 26*  CREATININE 1.55*  CALCIUM 9.6  GLUCOSE 150*     Diagnostic Studies/Procedures    Cardiac Catheterization Operative Report  [name] [name]  [id number]  10/9/201511:21 AM  [name], MD  Procedure Performed:  1. Left Heart Catheterization 2. Selective Coronary Angiography 3. Right Heart Catheterization Operator: Lauree Chandler, MD  Indication: 68 yo female with history of severe AS, HTN, HLD, non-ischemic cardiomyopathy with plans for AVR. Here today for right and left heart cath.  Procedure Details:  The risks, benefits, complications, treatment options, and expected outcomes were discussed with the patient. The patient and/or family concurred with the proposed plan, giving informed consent. The patient was brought to the cath lab after IV hydration was begun and oral premedication was given. The patient was further sedated with Versed and Fentanyl. The right groin was prepped and draped in the usual manner. Using the modified Seldinger access technique, a 5 French sheath was placed  in the right femoral artery. A 7 French sheath was placed in the right femoral vein. A balloon tipped catheter was used to perform a right heart catheterization. Standard diagnostic catheters were used to perform selective coronary angiography. A pigtail catheter was used to cross the aortic valve. LV pressures measured. No LV gram. There were no immediate complications. The patient was taken to the recovery area in stable  condition.  Hemodynamic Findings:  Ao: 137/72  LV: 164/15/21  RA: 11  RV: 50/13/13  PA: 43/17 (mean 27)  PCWP: 25  Fick Cardiac Output: 5.3 L/min  Fick Cardiac Index: 2.84 L/min/m2  Central Aortic Saturation: 97%  Pulmonary Artery Saturation: 67%  Aortic valve Data:  27 mmHg peak to peak gradient  15.8 mm HG mean gradient  AVA calculated 1.4 cm2  Angiographic Findings:  Left main: No evidence of disease.  Left Anterior Descending Artery: Large caliber vessel that courses to the apex. Large caliber diagonal branch. No evidence of disease.  Circumflex Artery: Large caliber vessel with large obtuse marginal branch. No evidence of disease.  Right Coronary Artery: Moderate sized dominant vessel. No evidence of disease.  Impression:  1. No angiographic evidence of CAD  2. Aortic stenosis, severe by dobutamine echo  Recommendations: Planning in place for aortic valve replacement. Pt will be admitted overnight for hydration post cath given renal insufficiency.    Discharge Medications     Medication List         acetaminophen 500 MG tablet  Commonly known as:  TYLENOL  Take 1,000 mg by mouth every 6 (six) hours as needed for mild pain, fever or headache.     aspirin EC 81 MG tablet  Take 1 tablet (81 mg total) by mouth daily.     atorvastatin 80 MG tablet  Commonly known as:  LIPITOR  TAKE ONE TABLET DAILY.     carvedilol 25 MG tablet  Commonly known as:  COREG  Take 1 tablet (25 mg total) by mouth 2 (two) times daily.     fenofibrate 145 MG tablet  Commonly known as:  TRICOR  TAKE ONE TABLET BY MOUTH ONCE DAILY.     furosemide 40 MG tablet  Commonly known as:  LASIX  Take 1 tablet (40 mg total) by mouth every morning.     Insulin Lispro Prot & Lispro (50-50) 100 UNIT/ML Kwikpen  Commonly known as:  HUMALOG 50/50 MIX  Inject 10 Units into the skin 2 (two) times daily.     multivitamin with minerals Tabs tablet  Take 1 tablet by mouth every morning.     niacin  1000 MG CR tablet  Commonly known as:  NIASPAN  Take 1 tablet (1,000 mg total) by mouth at bedtime.     spironolactone 25 MG tablet  Commonly known as:  ALDACTONE  Take 1 tablet (25 mg total) by mouth every morning.        Disposition   The patient will be discharged in stable condition to home.  Follow-up Information   Follow up with Rozann Lesches, MD. (The office will call you to make an appoinment. If you do not hear from them, please contact them.)    Specialty:  Cardiology   Contact information:   [place] [id number] [phone]         Duration of Discharge Encounter: Greater than 30 minutes including physician and PA time.  SignedAngelena Form R PA-C 05/24/2014, 10:39 AM

## 2014-05-24 NOTE — Progress Notes (Signed)
Patient Name: [name] [name] Date of Encounter: 05/24/2014     Active Problems:   Severe aortic stenosis    SUBJECTIVE  Feeling good and ready to go home.   CURRENT MEDS . aspirin EC  81 mg Oral Daily  . atorvastatin  80 mg Oral q1800  . carvedilol  25 mg Oral BID WC  . fenofibrate  160 mg Oral Daily  . furosemide  40 mg Oral q morning - 10a  . insulin aspart protamine- aspart  10 Units Subcutaneous BID WC  . spironolactone  25 mg Oral q morning - 10a    OBJECTIVE  Filed Vitals:   05/23/14 1700 05/23/14 2020 05/24/14 0011 05/24/14 0423  BP: 102/52 115/70 103/42 106/57  Pulse:  63 61 61  Temp:  97.8 F (36.6 C) 98 F (36.7 C) 98 F (36.7 C)  TempSrc:  Oral Oral Oral  Resp:  18 18 18   Height:      Weight:   174 lb 2.6 oz (79 kg)   SpO2:  96% 95% 99%    Intake/Output Summary (Last 24 hours) at 05/24/14 0805 Last data filed at 05/24/14 0600  Gross per 24 hour  Intake  437.5 ml  Output   2850 ml  Net -2412.5 ml   Filed Weights   05/23/14 0818 05/23/14 1333 05/24/14 0011  Weight: 174 lb (78.926 kg) 173 lb 15.1 oz (78.9 kg) 174 lb 2.6 oz (79 kg)    PHYSICAL EXAM  General: Pleasant, NAD. obese Neuro: Alert and oriented X 3. Moves all extremities spontaneously. Psych: Normal affect. HEENT:  Normal  Neck: Supple without bruits or JVD. Lungs:  Resp regular and unlabored, CTA. Heart: RRR no s3, s4, or + murmurs. Abdomen: Soft, non-tender, non-distended, BS + x 4.  Extremities: No clubbing, cyanosis or edema. DP/PT/Radials 2+ and equal bilaterally. Groin site stable  Accessory Clinical Findings  CBC  Recent Labs  05/21/14 1537  WBC 4.9  NEUTROABS 2.2  HGB 11.5*  HCT 35.4*  MCV 79.6  PLT [id number]   Basic Metabolic Panel  Recent Labs  05/23/14 0842  NA 140  K 4.0  CL 104  CO2 24  GLUCOSE 223*  BUN 32*  CREATININE 1.51*  CALCIUM 9.7    TELE  NSR with LBBB. Freq PVCs  Radiology/Studies  No results found.  ASSESSMENT AND PLAN [name] [name] is a 68 y.o. female with a history of AS, NICM/chronic systolic CHF and CKD who presented to [facility] on 05/23/14 for elective cardiac cath as a part of a work up for possible AVR with Dr. Roxy Manns.    Aortic stenosis -severe by dobutamine ECHO. --  Undergoing pre-operative work up for AVR  -- s/p LHC yesterday with no angiographic evidence of CAD. Groin site stable.   Chronic systolic heart failure -Symptomatically stable with LVEF 15-20%.  -- Continue current regimen.   CKD (chronic kidney disease) stage 3, GFR 30-59 ml/min  -- Creat 1.5 on admission. Kept overnight for hydration after LHC. No BMET this AM.  -- Will get a stat BMET and if kidney function stable she can go home.   Nonischemic cardiomyopathy  -- Patient had no significant CAD at catheterization in 2013, reportedly mild plaque in the mid RCA by chest CT (not gated study for coronary anatomy however). LHC on this admission with no angiographic evidence of CAD    Signed, Eileen Stanford PA-C  Pager [phone]   Attending note:  Patient seen and examined.  Agree with above assessment and [name]. Patient status post diagnostic heart catheterization in preparation for AVR. No significant obstructive coronary artery disease noted. She was admitted for hydration overnight, followup BMET pending. If renal function is relatively stable, anticipate discharge today.  [name], M.D., F.A.C.C.

## 2014-05-24 NOTE — Discharge Summary (Signed)
Please see rounding note as well.

## 2014-05-24 NOTE — Discharge Instructions (Signed)

## 2014-05-29 ENCOUNTER — Telehealth: Payer: Self-pay

## 2014-05-29 NOTE — Telephone Encounter (Signed)
Bronch rescheduled at [facility] on Thursday 10/22 at 8:15.  Pt aware of this. Nothing needed, just FYI.

## 2014-05-30 ENCOUNTER — Inpatient Hospital Stay (HOSPITAL_COMMUNITY): Admission: RE | Admit: 2014-05-30 | Payer: Self-pay | Source: Ambulatory Visit

## 2014-06-05 ENCOUNTER — Encounter (HOSPITAL_COMMUNITY)
Admission: RE | Disposition: A | Discharge status: Home / Self Care | Payer: Medicare Other | Source: Ambulatory Visit | Attending: Pulmonary Disease

## 2014-06-05 ENCOUNTER — Telehealth: Payer: Self-pay | Admitting: Pulmonary Disease

## 2014-06-05 ENCOUNTER — Encounter (HOSPITAL_COMMUNITY): Admission: RE | Payer: Self-pay | Source: Ambulatory Visit

## 2014-06-05 ENCOUNTER — Other Ambulatory Visit: Payer: Self-pay | Admitting: Pulmonary Disease

## 2014-06-05 ENCOUNTER — Ambulatory Visit (HOSPITAL_COMMUNITY)
Admission: RE | Admit: 2014-06-05 | Discharge: 2014-06-05 | Disposition: A | Discharge status: Home / Self Care | Payer: Medicare Other | Source: Ambulatory Visit | Attending: Pulmonary Disease | Admitting: Pulmonary Disease

## 2014-06-05 ENCOUNTER — Ambulatory Visit (HOSPITAL_COMMUNITY): Admission: RE | Admit: 2014-06-05 | Payer: Medicare Other | Source: Ambulatory Visit | Admitting: Pulmonary Disease

## 2014-06-05 DIAGNOSIS — J9809 Other diseases of bronchus, not elsewhere classified: Secondary | ICD-10-CM | POA: Diagnosis not present

## 2014-06-05 DIAGNOSIS — J9819 Other pulmonary collapse: Secondary | ICD-10-CM | POA: Diagnosis not present

## 2014-06-05 DIAGNOSIS — I5022 Chronic systolic (congestive) heart failure: Secondary | ICD-10-CM | POA: Diagnosis not present

## 2014-06-05 DIAGNOSIS — I129 Hypertensive chronic kidney disease with stage 1 through stage 4 chronic kidney disease, or unspecified chronic kidney disease: Secondary | ICD-10-CM | POA: Insufficient documentation

## 2014-06-05 DIAGNOSIS — Z794 Long term (current) use of insulin: Secondary | ICD-10-CM | POA: Insufficient documentation

## 2014-06-05 DIAGNOSIS — I350 Nonrheumatic aortic (valve) stenosis: Secondary | ICD-10-CM | POA: Diagnosis not present

## 2014-06-05 DIAGNOSIS — E669 Obesity, unspecified: Secondary | ICD-10-CM | POA: Diagnosis not present

## 2014-06-05 DIAGNOSIS — Z8701 Personal history of pneumonia (recurrent): Secondary | ICD-10-CM | POA: Diagnosis not present

## 2014-06-05 DIAGNOSIS — N183 Chronic kidney disease, stage 3 (moderate): Secondary | ICD-10-CM | POA: Diagnosis not present

## 2014-06-05 DIAGNOSIS — E119 Type 2 diabetes mellitus without complications: Secondary | ICD-10-CM | POA: Diagnosis not present

## 2014-06-05 DIAGNOSIS — E785 Hyperlipidemia, unspecified: Secondary | ICD-10-CM | POA: Diagnosis not present

## 2014-06-05 HISTORY — PX: VIDEO BRONCHOSCOPY: SHX5072

## 2014-06-05 LAB — GLUCOSE, CAPILLARY: Glucose-Capillary: 140 mg/dL — ABNORMAL HIGH (ref 70–99)

## 2014-06-05 SURGERY — VIDEO BRONCHOSCOPY WITHOUT FLUORO
Anesthesia: Moderate Sedation | Laterality: Bilateral

## 2014-06-05 MED ORDER — PHENYLEPHRINE HCL 0.25 % NA SOLN
NASAL | Status: DC | PRN
Start: 2014-06-05 — End: 2014-06-05
  Administered 2014-06-05: 2 via NASAL

## 2014-06-05 MED ORDER — FENTANYL CITRATE 0.05 MG/ML IJ SOLN
INTRAMUSCULAR | Status: DC | PRN
  Administered 2014-06-05 (×3): 25 ug via INTRAVENOUS

## 2014-06-05 MED ORDER — FENTANYL CITRATE 0.05 MG/ML IJ SOLN
INTRAMUSCULAR | Status: AC
  Filled 2014-06-05: qty 4

## 2014-06-05 MED ORDER — LIDOCAINE HCL 2 % EX GEL
CUTANEOUS | Status: DC | PRN
  Administered 2014-06-05: 1

## 2014-06-05 MED ORDER — MIDAZOLAM HCL 5 MG/ML IJ SOLN
INTRAMUSCULAR | Status: AC
Start: 2014-06-05 — End: 2014-06-05
  Filled 2014-06-05: qty 2

## 2014-06-05 MED ORDER — LIDOCAINE HCL (PF) 1 % IJ SOLN
INTRAMUSCULAR | Status: DC | PRN
  Administered 2014-06-05: 4 mL
  Administered 2014-06-05: 6 mL

## 2014-06-05 MED ORDER — MIDAZOLAM HCL 10 MG/2ML IJ SOLN
INTRAMUSCULAR | Status: DC | PRN
  Administered 2014-06-05 (×3): 1 mg via INTRAVENOUS

## 2014-06-05 NOTE — Telephone Encounter (Signed)
Spoke with Dr [name] he states that pt needs referral to either Dr [name] or Jobie Quaker (thoracic surgery) for a rigid bronch.  Referral placed.

## 2014-06-05 NOTE — H&P (Signed)
[facility]  I have seen and examined [name] today.  There have been no changes to my exam from earlier this month when I saw her.  She has a broncholith in her right middle lobe seen on CT chest.  Past Medical History  Diagnosis Date  . Essential hypertension, benign   . Hyperlipidemia   . Anxiety   . Aortic stenosis   . Symptomatic PVCs   . Cardiomyopathy     LVEF of 20-25% on echo in 10/2011, has declined ICD  . CKD (chronic kidney disease) stage 3, GFR 30-59 ml/min   . Chronic systolic heart failure   . Obesity   . LBBB (left bundle branch block)   . Depression   . Constipation   . Incidental pulmonary nodule, > 35mm and < 47mm     7 x 4 mm LLL nodule  . Pneumonia 2013 X 2  . Diabetes mellitus, type 2   . Diabetes mellitus, insulin dependent (IDDM), uncontrolled     hBA1C is 8.1 in 11/2013   . Arthritis     "arms, legs, knees" (05/23/2014)  . CHF (congestive heart failure)    Allergies: PCN Past Medical History  Diagnosis Date  . Essential hypertension, benign   . Hyperlipidemia   . Anxiety   . Aortic stenosis   . Symptomatic PVCs   . Cardiomyopathy     LVEF of 20-25% on echo in 10/2011, has declined ICD  . CKD (chronic kidney disease) stage 3, GFR 30-59 ml/min   . Chronic systolic heart failure   . Obesity   . LBBB (left bundle branch block)   . Depression   . Constipation   . Incidental pulmonary nodule, > 67mm and < 70mm     7 x 4 mm LLL nodule  . Pneumonia 2013 X 2  . Diabetes mellitus, type 2   . Diabetes mellitus, insulin dependent (IDDM), uncontrolled     hBA1C is 8.1 in 11/2013   . Arthritis     "arms, legs, knees" (05/23/2014)  . CHF (congestive heart failure)      Family History  Problem Relation Age of Onset  . Heart disease Mother   . Heart attack Mother 60  . Diabetes Mother   . Hypertension Mother   . Colon cancer Father 69  . Breast cancer Sister   . Diabetes Brother   . Hypertension Brother   . Stroke Brother      History    Social History  . Marital Status: Married    Spouse Name: N/A    Number of Children: 41  . Years of Education: N/A   Occupational History  . Retired    Social History Main Topics  . Smoking status: Never Smoker   . Smokeless tobacco: Never Used  . Alcohol Use: No  . Drug Use: No  . Sexual Activity: No   Other Topics Concern  . Not on file   Social History Narrative  . No narrative on file     Allergies  Allergen Reactions  . Penicillins     Family unsure of reaction, but certain mom said she was allergic      (Not in an outpatient encounter)  Filed Vitals:   06/05/14 [id number] 06/05/14 0740 06/05/14 0750 06/05/14 0800  BP: 130/76 140/68    Pulse: 72 66 69 70  Resp: 19 13 15 17   SpO2: 100% 99% 96% 100%   RA  Gen: anxious HEENT: NCAT, EOMi, OP clear PULM:  CTA B CV: RRR, systolic murmur AB: soft, nontender  Impression: 1) broncholith with possible RML obstruction 2) Aortic stenosis  Plan: 1) Flexible bronchoscopy   Roselie Awkward, MD [place] [facility] Pager: [phone] Cell: [phone] If no response, call [phone]

## 2014-06-05 NOTE — Discharge Instructions (Signed)
Flexible Bronchoscopy, Care After Refer to this sheet in the next few weeks. These instructions provide you with information on caring for yourself after your procedure. Your health care provider may also give you more specific instructions. Your treatment has been planned according to current medical practices, but problems sometimes occur. Call your health care provider if you have any problems or questions after your procedure.  WHAT TO EXPECT AFTER THE PROCEDURE It is normal to have the following symptoms for 24-48 hours after the procedure:   Increased cough.  Low-grade fever.  Sore throat or hoarse voice.  Small streaks of blood in your thick spit (sputum) if tissue samples were taken (biopsy). HOME CARE INSTRUCTIONS   Do not eat or drink anything for 2 hours after your procedure. YOU MAY BEGIN WITH LIQUIDS AT 11 AM. Your nose and throat were numbed by medicine. If you try to eat or drink before the medicine wears off, food or drink could go into your lungs or you could burn yourself. After the numbness is gone and your cough and gag reflexes have returned, you may eat soft food and drink liquids slowly.   The day after the procedure, you can go back to your normal diet.   You may resume normal activities.   Keep all follow-up visits as directed by your health care provider. It is important to keep all your appointments, especially if tissue samples were taken for testing (biopsy). SEEK IMMEDIATE MEDICAL CARE IF:   You have increasing shortness of breath.   You become light-headed or faint.   You have chest pain.   You have any new concerning symptoms.  You cough up more than a small amount of blood.  The amount of blood you cough up increases. MAKE SURE YOU:  Understand these instructions.  Will watch your condition.  Will get help right away if you are not doing well or get worse. Document Released: 02/18/2005 Document Revised: 12/16/2013 Document Reviewed:  04/05/2013 [facility] Patient Information 2015 [place], [place]. This information is not intended to replace advice given to you by your health care provider. Make sure you discuss any questions you have with your health care provider.

## 2014-06-05 NOTE — Op Note (Signed)
PCCM Bronchoscopy Procedure Note  The patient was informed of the risks (including but not limited to bleeding, infection, respiratory failure, lung injury, tooth/oral injury) and benefits of the procedure and gave consent, see chart.  Indication: Right middle lobe broncholith  Location:  Bronchoscopy Suite  Condition pre procedure: stable  Medications for procedure: Versed 3mg , Fentanyl 55mcg, lidocaine 1% plain topical to vocal cords and endobronchial endothelium  Procedure description: The bronchoscope was introduced through the nose and passed to the bilateral lungs to the level of the subsegmental bronchi throughout the tracheobronchial tree.  Airway exam revealed normal appearing larynx, trachea and left traechobronchial tree was normal in appearance.  The right tracheobronchial tree was normal with the exception of the right middle lobe which was completely obstructed by a chronic appearing foreign body.  The abnormality had mucosa growing around its edges.  The lesion did not bleed significantly but the patient did have excessive coughing during the procedure.  Procedures performed:  1) endobronchial biopsy RML lesion 2) bronchial washing RML lesion  Specimens sent:  1) BAL > bacterial, fungal and AFB culture 2) Endobronchial biopsies RML > only three taken due to excessive coughing during the procedure  Condition post procedure: stable  EBL: < [id number]  Complications: none  Post procedure diagnosis: Foreign body completely obstructing the right middle lobe orifice  Discussion: She has complete obstruction of her right middle lobe with a history of right middle and lower lobe pneumonia in 2013.  Case discussed with Dr. Roxy Manns. While the lesion will not significantly effect her respiratory physiology, she is at excessive risk for pneumonia.  Will request thoracic surgery consult for rigid bronchoscopy and foreign body removal.    Patient instructions:  1)  We will  arrange a thoracic surgery consultation for rigid bronchoscopy  Roselie Awkward, MD [place] [facility] Pager: [phone] Cell: [phone] If no response, call [phone]

## 2014-06-06 ENCOUNTER — Encounter (HOSPITAL_COMMUNITY): Payer: Self-pay | Admitting: Pulmonary Disease

## 2014-06-08 LAB — CULTURE, RESPIRATORY

## 2014-06-08 LAB — CULTURE, RESPIRATORY W GRAM STAIN

## 2014-06-09 ENCOUNTER — Ambulatory Visit: Payer: Self-pay | Admitting: Cardiovascular Disease

## 2014-06-09 ENCOUNTER — Ambulatory Visit: Payer: Medicare Other | Admitting: Thoracic Surgery (Cardiothoracic Vascular Surgery)

## 2014-06-09 ENCOUNTER — Encounter: Payer: Self-pay | Admitting: Thoracic Surgery (Cardiothoracic Vascular Surgery)

## 2014-06-10 ENCOUNTER — Encounter: Payer: Self-pay | Admitting: Thoracic Surgery (Cardiothoracic Vascular Surgery)

## 2014-06-10 ENCOUNTER — Institutional Professional Consult (permissible substitution) (INDEPENDENT_AMBULATORY_CARE_PROVIDER_SITE_OTHER): Payer: Medicare Other | Admitting: Thoracic Surgery (Cardiothoracic Vascular Surgery)

## 2014-06-10 VITALS — BP 118/77 | HR 96 | Resp 16 | Ht 65.0 in | Wt 172.6 lb

## 2014-06-10 DIAGNOSIS — J9819 Other pulmonary collapse: Secondary | ICD-10-CM

## 2014-06-10 DIAGNOSIS — R0600 Dyspnea, unspecified: Secondary | ICD-10-CM

## 2014-06-10 DIAGNOSIS — T17508D Unspecified foreign body in bronchus causing other injury, subsequent encounter: Secondary | ICD-10-CM

## 2014-06-10 NOTE — Progress Notes (Signed)
PCP is [name], MD Referring Provider is [name], MD  Chief Complaint  Patient presents with  . Shortness of Breath    RIGHT MIDDLE LOBE SYNDROME..EVAL FOR RIGID BRONCH    HPI: 68 year old woman with severe low output aortic stenosis who is being evaluated for possible T AVR. As part of her evaluation she had a CT of the chest. On that she was noted to have a calcified broncholith of the right middle lobe bronchus with bronchiectasis and atelectasis of the lateral segment of the right middle lobe. She was also noted to have a small left lower lobe nodule. She had flexible bronchoscopy by Dr. [name]. He was able to identify a foreign body but was unable to extract it.  She says she doesn't understand why she is having to have all of these procedures done. She says that she is "worn out" from having to fill out a lot of paperwork. She says that she is doing fine. She does get short of breath with exertion. She also feels fatigued. She does have a cough which is nonproductive. She does not have any hemoptysis. She does have a history of pneumonia twice in the recent years.  Past Medical History  Diagnosis Date  . Essential hypertension, benign   . Hyperlipidemia   . Anxiety   . Aortic stenosis   . Symptomatic PVCs   . Cardiomyopathy     LVEF of 20-25% on echo in 10/2011, has declined ICD  . CKD (chronic kidney disease) stage 3, GFR 30-59 ml/min   . Chronic systolic heart failure   . Obesity   . LBBB (left bundle branch block)   . Depression   . Constipation   . Incidental pulmonary nodule, > 87mm and < 52mm     7 x 4 mm LLL nodule  . Pneumonia 2013 X 2  . Diabetes mellitus, type 2   . Diabetes mellitus, insulin dependent (IDDM), uncontrolled     hBA1C is 8.1 in 11/2013   . Arthritis     "arms, legs, knees" (05/23/2014)  . CHF (congestive heart failure)     Past Surgical History  Procedure Laterality Date  . Cesarean section  1987  . Multiple extractions with  alveoloplasty N/A 02/27/2014    Procedure: Extraction of tooth #'s 2,4,5,6,7,8,9,10,11,12, 22, 23, 24, 25, 26, 28 with alveoloplasty and bilateral mandibular tori reductions;  Surgeon: [name], DDS;  Location: [place];  Service: Oral Surgery;  Laterality: N/A;  . Tubal ligation  1987  . Cardiac catheterization  2013; 05/22/2014  . Video bronchoscopy Bilateral 06/05/2014    Procedure: VIDEO BRONCHOSCOPY WITHOUT FLUORO;  Surgeon: [name], MD;  Location: [place];  Service: Cardiopulmonary;  Laterality: Bilateral;    Family History  Problem Relation Age of Onset  . Heart disease Mother   . Heart attack Mother 65  . Diabetes Mother   . Hypertension Mother   . Colon cancer Father 35  . Breast cancer Sister   . Diabetes Brother   . Hypertension Brother   . Stroke Brother     Social History History  Substance Use Topics  . Smoking status: Never Smoker   . Smokeless tobacco: Never Used  . Alcohol Use: No    Current Outpatient Prescriptions  Medication Sig Dispense Refill  . acetaminophen (TYLENOL) 500 MG tablet Take 1,000 mg by mouth every 6 (six) hours as needed for mild pain, fever or headache.       Marland Kitchen aspirin  EC 81 MG tablet Take 1 tablet (81 mg total) by mouth daily.  150 tablet  2  . atorvastatin (LIPITOR) 80 MG tablet Take 80 mg by mouth daily.      . carvedilol (COREG) 25 MG tablet Take 1 tablet (25 mg total) by mouth 2 (two) times daily.  60 tablet  6  . fenofibrate (TRICOR) 145 MG tablet Take 145 mg by mouth daily.      . furosemide (LASIX) 40 MG tablet Take 1 tablet (40 mg total) by mouth every morning.  30 tablet  6  . niacin (NIASPAN) 1000 MG CR tablet Take 1 tablet (1,000 mg total) by mouth at bedtime.  30 tablet  4  . spironolactone (ALDACTONE) 25 MG tablet Take 1 tablet (25 mg total) by mouth every morning.  90 tablet  3  . Insulin Lispro Prot & Lispro (HUMALOG 50/50 MIX) (50-50) 100 UNIT/ML Kwikpen Inject 10 Units into the skin 2 (two) times daily.   15 mL  11  . Multiple Vitamin (MULITIVITAMIN WITH MINERALS) TABS Take 1 tablet by mouth every morning.      . [DISCONTINUED] cetirizine (ZYRTEC) 10 MG tablet Take 1 tablet (10 mg total) by mouth daily.  30 tablet  3  . [DISCONTINUED] ferrous sulfate 325 (65 FE) MG EC tablet Take 1 tablet (325 mg total) by mouth 2 (two) times daily.  60 tablet  5  . [DISCONTINUED] FLUoxetine (PROZAC) 10 MG tablet Take 1 tablet (10 mg total) by mouth daily. Take one capsule by mouth once a day  30 tablet  3  . [DISCONTINUED] fluticasone (FLONASE) 50 MCG/ACT nasal spray Place 2 sprays into the nose daily.  16 g  3   No current facility-administered medications for this visit.    Allergies  Allergen Reactions  . Penicillins     Family unsure of reaction, but certain mom said she was allergic    Review of Systems  Constitutional: Positive for fatigue.  HENT: Positive for dental problem (recent extractions).   Respiratory: Positive for cough (no hemoptysis) and shortness of breath.   Cardiovascular: Negative for chest pain.       Varicose veins  Gastrointestinal: Positive for constipation.  Genitourinary:       Kidney disease  Musculoskeletal: Positive for arthralgias.  Hematological: Does not bruise/bleed easily.  Psychiatric/Behavioral: The patient is nervous/anxious.   All other systems reviewed and are negative.   BP 118/77  Pulse 96  Resp 16  Ht 5\' 5"  (1.651 m)  Wt 172 lb 9.6 oz (78.291 kg)  BMI 28.72 kg/m2  SpO2 98% Physical Exam  Vitals reviewed. Constitutional: She is oriented to person, place, and time. No distress.  Obese, distracted  HENT:  Head: Normocephalic and atraumatic.  edentulous  Eyes: EOM are normal. Pupils are equal, round, and reactive to light.  Neck: Neck supple. No JVD present. No tracheal deviation present.  Cardiovascular: Normal rate and regular rhythm.   Murmur (3/6 crescendo/decrescendo) heard. Pulmonary/Chest: Effort normal and breath sounds normal. She has  no wheezes.  Abdominal: Soft. There is no tenderness.  Lymphadenopathy:    She has no cervical adenopathy.  Neurological: She is alert and oriented to person, place, and time. No cranial nerve deficit.  Skin: Skin is warm and dry.     Diagnostic Tests: CLINICAL DATA: Aortic stenosis  EXAM:  Cardiac TAVR CT  TECHNIQUE:  The patient was scanned on a Philips 256 scanner. A 120 kV  retrospective scan was triggered in the  descending thoracic aorta at  111 HU's. Gantry rotation speed was 270 msecs and collimation was .9  mm. 2.5 mg of iv Metoprolol and no nitro were given. The 3D data set  was reconstructed in 5% intervals of the R-R cycle. Systolic and  diastolic phases were analyzed on a dedicated work station using  MPR, MIP and VRT modes. The patient received 80 cc of contrast.  FINDINGS:  Aortic Valve: Trileaflet, minimally calcified with restricted  leaflet opening  Aorta: Visualized only up the the proximal portion of the ascending  thoracic aorta.  Sinotubular Junction: 24 x 24 mm  Ascending Thoracic Aorta: Not visualized  Aortic Arch: Not visualized  Descending Thoracic Aorta: Not visualized  Sinus of Valsalva Measurements:  Non-coronary: 27 mm  Right -coronary: 28 mm  Left -coronary: 27 mm  Coronary Artery Height above Annulus:  Left Main: 13 mm  Right Coronary: 10 mm  Virtual Basal Annulus Measurements:  Maximum/Minimum Diameter: 27 x 22 mm  Perimeter: 92 mm  Area: 455 mm2  Coronary Arteries: The study not intended for coronary evaluation  and without use of NTG. There is significant motion in the RCA.  Normal origin of coronary arteries. Right dominance. Mild calcified  plague in the mid RCA. Otherwise normal coronaries. Mid to distal  LAD is not visualized.  Optimum Fluoroscopic Angle for Delivery: RAO 0, CRA 5.  IMPRESSION:  1. Aortic valve measurements optimal for delivery of 26 mm Edward -  Sapien XT THV.  2. Sufficient annulus to coronary distance for  delivery of 26 mm  Edward - Sapien XT THV.  3. Optimum Fluoroscopic Angle for Delivery is RAO 0, CRA 5.  [name] [name]  Electronically Signed  By: [name] [name]  On: 02/10/2014 18:48       Study Result    EXAM:  OVER-READ INTERPRETATION CT CHEST  The following report is an over-read performed by radiologist Dr.  Rebekah Chesterfield [facility] on 02/10/2014. This  over-read does not include interpretation of cardiac or coronary  anatomy or pathology. The coronary calcium score/coronary CTA  interpretation by the cardiologist is attached.  COMPARISON: No priors.  FINDINGS:  Linear opacity in the inferior segment of the lingula is compatible  with subsegmental atelectasis or scarring. 7 x 4 mm left lower lobe  nodule (image 53 of series 502). Calcification in the distal  bronchus intermedius which appears to extend in the proximal right  middle lobe bronchus. This is associated with what appears to be  chronic collapse of the right middle lobe where there is some mild  cylindrical bronchiectasis. No pleural effusions. Small  calcifications in the liver are compatible with calcified  granulomas. There are no aggressive appearing lytic or blastic  lesions noted in the visualized portions of the skeleton.  IMPRESSION:  1. 7 x 4 mm left lower lobe pulmonary nodule. If the patient is at  high risk for bronchogenic carcinoma, follow-up chest CT at  3-31months is recommended. If the patient is at low risk for  bronchogenic carcinoma, follow-up chest CT at 6-12 months is  recommended. This recommendation follows the consensus statement:  Guidelines for Management of Small Pulmonary Nodules Detected on CT  Scans: A Statement from the [employer] as published in  Radiology 2005; 237:395-400.  2. There appears to be a calcified broncholith within the distal  bronchus intermedius and proximal right middle lobe bronchus which  is presumably obstructive, as there is what  appears to be chronic  collapse of the  right middle lobe. Nonemergent bronchoscopic  correlation may be warranted if clinically indicated.  Electronically Signed:  By: Vinnie Langton M.D.  On: 02/10/2014 15:38     Impression: 68 year old woman with low output, severe AS who has a calcified broncholith in the right middle lobe bronchus with atelectasis and bronchiectasis of the lateral segment of the right middle lobe. This could not be extracted with the flexible bronchoscope.  Rigid bronchoscopy is indicated to remove the formed body and hopefully decrease her risk of recurrent pneumonia. I do think her risk for pneumonia will still be elevated relative to normal given the degree of bronchiectasis that is present.  I described the procedure to her in detail. This will be done under general anesthesia. We will do flexible bronchoscopy initially, followed by rigid bronchoscopy for foreign body extraction. I discussed with her the indications, risks, benefits, and alternatives. She understands that she does have higher than normal risk for general anesthesia given her aortic stenosis. She has had a dental extraction under general anesthesia recently, and did not have any significant issues with that. She understands the risks include, but are not limited to, death, MI, bleeding, pneumothorax, failure to extract the foreign body, as well as the possibility of other unforeseeable complications.  She is very anxious about having anything done. She wishes to speak with her family before she makes any decisions. Should she decide to proceed we will should be able to schedule it with relatively short notice.  She will talk to her family. She will be back in the office next Monday to see Dr. Roxy Manns. We'll be happy to answer any questions she may have in the meantime.

## 2014-06-13 NOTE — Progress Notes (Signed)
Quick Note:  Called and spoke to pt. Informed pt of the results and recs per BQ. Pt verbalized understanding and denied any further questions or concerns at this time. ______

## 2014-06-14 ENCOUNTER — Other Ambulatory Visit: Payer: Self-pay | Admitting: Family Medicine

## 2014-06-16 ENCOUNTER — Other Ambulatory Visit: Payer: Self-pay | Admitting: Adult Health

## 2014-06-16 ENCOUNTER — Ambulatory Visit: Payer: Medicare Other | Admitting: Thoracic Surgery (Cardiothoracic Vascular Surgery)

## 2014-06-16 MED ORDER — CARVEDILOL 25 MG PO TABS: 25.0000 mg | ORAL_TABLET | Freq: Two times a day (BID) | ORAL | Status: DC

## 2014-06-16 NOTE — Telephone Encounter (Signed)
Received fax refill request  Rx # [id number] Medication:  Carvedilol 25 mg tablet Qty 60 Sig:  Take one tablet by mouth twice daily with meals Physician:  Purcell Nails

## 2014-06-17 ENCOUNTER — Telehealth: Payer: Self-pay | Admitting: *Deleted

## 2014-06-26 ENCOUNTER — Other Ambulatory Visit: Payer: Self-pay | Admitting: Adult Health

## 2014-07-01 ENCOUNTER — Encounter: Payer: Self-pay | Admitting: Cardiology

## 2014-07-01 NOTE — Progress Notes (Signed)
PPatient canceled visit.  This encounter was created in error - please disregard.

## 2014-07-02 LAB — FUNGUS CULTURE W SMEAR: Fungal Smear: NONE SEEN

## 2014-07-14 ENCOUNTER — Ambulatory Visit: Payer: Medicare Other | Admitting: Family Medicine

## 2014-07-16 ENCOUNTER — Telehealth: Payer: Self-pay

## 2014-07-16 MED ORDER — GLUCOSE BLOOD VI STRP: ORAL_STRIP | Status: DC

## 2014-07-16 MED ORDER — INSULIN PEN NEEDLE 31G X 8 MM MISC: Status: DC

## 2014-07-16 NOTE — Telephone Encounter (Signed)
Patient aware that strips and needles will be sent in to [facility]

## 2014-07-18 LAB — AFB CULTURE WITH SMEAR (NOT AT ARMC): Acid Fast Smear: NONE SEEN

## 2014-07-24 ENCOUNTER — Encounter (HOSPITAL_COMMUNITY): Payer: Self-pay | Admitting: Cardiovascular Disease

## 2014-07-28 ENCOUNTER — Encounter: Payer: Self-pay | Admitting: Cardiology

## 2014-07-28 ENCOUNTER — Encounter: Payer: Self-pay | Admitting: *Deleted

## 2014-07-28 NOTE — Progress Notes (Signed)
No show  This encounter was created in error - please disregard.

## 2014-08-13 ENCOUNTER — Encounter: Payer: Self-pay | Admitting: *Deleted

## 2014-08-21 ENCOUNTER — Other Ambulatory Visit: Payer: Self-pay | Admitting: *Deleted

## 2014-08-26 ENCOUNTER — Encounter: Payer: Self-pay | Admitting: *Deleted

## 2014-08-26 ENCOUNTER — Ambulatory Visit: Payer: Medicare Other | Admitting: Family Medicine

## 2014-09-12 ENCOUNTER — Other Ambulatory Visit: Payer: Self-pay | Admitting: Family Medicine

## 2014-09-13 LAB — BASIC METABOLIC PANEL
BUN: 31 mg/dL — ABNORMAL HIGH (ref 6–23)
CO2: 26 mEq/L (ref 19–32)
Calcium: 9.6 mg/dL (ref 8.4–10.5)
Chloride: 101 mEq/L (ref 96–112)
Creat: 1.56 mg/dL — ABNORMAL HIGH (ref 0.50–1.10)
Glucose, Bld: 208 mg/dL — ABNORMAL HIGH (ref 70–99)
Potassium: 4.3 mEq/L (ref 3.5–5.3)
Sodium: 136 mEq/L (ref 135–145)

## 2014-09-13 LAB — LIPID PANEL
Cholesterol: 345 mg/dL — ABNORMAL HIGH (ref 0–200)
HDL: 62 mg/dL (ref >39)
LDL Cholesterol: 271 mg/dL — ABNORMAL HIGH (ref 0–99)
Total CHOL/HDL Ratio: 5.6 Ratio
Triglycerides: 59 mg/dL (ref <150)
VLDL: 12 mg/dL (ref 0–40)

## 2014-09-13 LAB — TSH: TSH: 2.455 u[IU]/mL (ref 0.350–4.500)

## 2014-09-15 ENCOUNTER — Encounter: Payer: Self-pay | Admitting: Thoracic Surgery (Cardiothoracic Vascular Surgery)

## 2014-09-15 ENCOUNTER — Ambulatory Visit (INDEPENDENT_AMBULATORY_CARE_PROVIDER_SITE_OTHER): Payer: Commercial Managed Care - HMO | Admitting: Thoracic Surgery (Cardiothoracic Vascular Surgery)

## 2014-09-15 VITALS — BP 110/75 | HR 97 | Resp 16 | Ht 65.0 in | Wt 178.5 lb

## 2014-09-15 DIAGNOSIS — I350 Nonrheumatic aortic (valve) stenosis: Secondary | ICD-10-CM

## 2014-09-15 DIAGNOSIS — I5042 Chronic combined systolic (congestive) and diastolic (congestive) heart failure: Secondary | ICD-10-CM

## 2014-09-15 LAB — HEPATIC FUNCTION PANEL
ALT: 18 U/L (ref 0–35)
AST: 23 U/L (ref 0–37)
Albumin: 3.9 g/dL (ref 3.5–5.2)
Alkaline Phosphatase: 42 U/L (ref 39–117)
Bilirubin, Direct: 0.1 mg/dL (ref 0.0–0.3)
Indirect Bilirubin: 0.3 mg/dL (ref 0.2–1.2)
Total Bilirubin: 0.4 mg/dL (ref 0.2–1.2)
Total Protein: 7.2 g/dL (ref 6.0–8.3)

## 2014-09-15 NOTE — Progress Notes (Signed)
[name] [name],[name] [name]             [phone]     CARDIOTHORACIC SURGERY OFFICE NOTE  Primary Cardiologist is [name] Sark, [name] [name] Referring Provider is [name] Ohara, [name] [name] PCP is [name] Nakayama, [name] [name]   HPI:  Patient returns to the office today for followup of stage D severe symptomatic low gradient aortic stenosis with underlying severe left ventricular systolic dysfunction. She was originally seen in consultation on 01/29/2014 and she was seen most recently on 05/12/2014. She underwent diagnostic cardiac catheterization 05/23/2014 by Dr [name] confirming the presence of normal coronary artery anatomy with no significant coronary artery disease.  There was mild to moderate pulmonary hypertension. Mean transvalvular gradient by catheterization was measured 15.4 mmHg.  On CT scan she was found to have a broncholith obstructing the right middle lobe bronchus with chronic right middle lobe collapse. She subsequently underwent flexible bronchoscopy on 06/05/2014 by Dr [name], but he was unable to extract the foreign body. The patient was referred to Dr. [name] for thoracic surgical consultation and plans for rigid bronchoscopy were made, but for personal reasons the patient has never followed through to schedule the procedure.   The patient returns to our office today for follow-up.  Her husband has been chronically ill and in and out of the hospital. He is currently in rehabilitation at the [facility].  The patient describes stable symptoms of exertional shortness of breath consistent with chronic combined systolic and diastolic congestive heart failure, New York Heart Association functional class II. Symptoms only occur when the patient pushes herself physically and she claims that she has not had to limit her physical activities at all. Her symptoms have not changed for the past 6-12 months. She does not experience any exertional chest pain or  chest tightness. She has never had any dizzy spells or syncope.   Current Outpatient Prescriptions  Medication Sig Dispense Refill  . acetaminophen (TYLENOL) 500 MG tablet Take 1,000 mg by mouth every 6 (six) hours as needed for mild pain, fever or headache.     Marland Kitchen aspirin EC 81 MG tablet Take 1 tablet (81 mg total) by mouth daily. 150 tablet 2  . atorvastatin (LIPITOR) 80 MG tablet TAKE ONE TABLET BY MOUTH ONCE DAILY. 30 tablet 3  . carvedilol (COREG) 25 MG tablet Take 1 tablet (25 mg total) by mouth 2 (two) times daily. 60 tablet 6  . fenofibrate (TRICOR) 145 MG tablet Take 145 mg by mouth daily.    . furosemide (LASIX) 40 MG tablet TAKE 1 TABLET BY MOUTH EVERY MORNING. 30 tablet 6  . glucose blood (EASYMAX TEST) test strip For three times daily testing dx E10.65 100 each 5  . Insulin Lispro Prot & Lispro (HUMALOG 50/50 MIX) (50-50) 100 UNIT/ML Kwikpen Inject 10 Units into the skin 2 (two) times daily. 15 mL 11  . Insulin Pen Needle (B-D ULTRAFINE III SHORT PEN) 31G X 8 MM MISC Use as needed to inject insulin 100 each 5  . Multiple Vitamin (MULITIVITAMIN WITH MINERALS) TABS Take 1 tablet by mouth every morning.    . niacin (NIASPAN) 1000 MG CR tablet Take 1 tablet (1,000 mg total) by mouth at bedtime. 30 tablet 4  . spironolactone (ALDACTONE) 25 MG tablet Take 1 tablet (25 mg total) by mouth every morning. 90 tablet 3  . [DISCONTINUED] cetirizine (ZYRTEC) 10 MG tablet Take 1 tablet (10 mg  total) by mouth daily. 30 tablet 3  . [DISCONTINUED] ferrous sulfate 325 (65 FE) MG EC tablet Take 1 tablet (325 mg total) by mouth 2 (two) times daily. 60 tablet 5  . [DISCONTINUED] FLUoxetine (PROZAC) 10 MG tablet Take 1 tablet (10 mg total) by mouth daily. Take one capsule by mouth once a day 30 tablet 3  . [DISCONTINUED] fluticasone (FLONASE) 50 MCG/ACT nasal spray Place 2 sprays into the nose daily. 16 g 3   No current facility-administered medications for this visit.      Physical Exam:   BP 110/75  mmHg  Pulse 97  Resp 16  Ht 5\' 5"  (1.651 m)  Wt 178 lb 8 oz (80.967 kg)  BMI 29.70 kg/m2  SpO2 98%  General:  Well-appearing  Chest:   Clear  CV:   Regular rate and rhythm with crescendo decrescendo systolic murmur  Incisions:  n/a  Abdomen:  Soft and nontender  Extremities:  Warm and well perfused, no lower extremity edema  Diagnostic Tests:  Cardiac Catheterization Operative Report  TEIARA JOHN [id number] 10/9/201511:21 AM [name] Nakayama, [name] [name]  Procedure Performed:  1. Left Heart Catheterization 2. Selective Coronary Angiography 3. Right Heart Catheterization  Operator: Lauree Chandler, [name] [name]  Indication: 69 yo female with history of severe AS, HTN, HLD, non-ischemic cardiomyopathy with plans for AVR. Here today for right and left heart cath.    Procedure Details: The risks, benefits, complications, treatment options, and expected outcomes were discussed with the patient. The patient and/or family concurred with the proposed plan, giving informed consent. The patient was brought to the cath lab after IV hydration was begun and oral premedication was given. The patient was further sedated with Versed and Fentanyl. The right groin was prepped and draped in the usual manner. Using the modified Seldinger access technique, a 5 French sheath was placed in the right femoral artery. A 7 French sheath was placed in the right femoral vein. A balloon tipped catheter was used to perform a right heart catheterization. Standard diagnostic catheters were used to perform selective coronary angiography. A pigtail catheter was used to cross the aortic valve. LV pressures measured. No LV gram. There were no immediate complications. The patient was taken to the recovery area in stable condition.   Hemodynamic Findings: Ao: 137/72  LV: 164/15/21 RA: 11  RV: 50/13/13 PA: 43/17 (mean 27)  PCWP: 25 Fick Cardiac Output: 5.3  L/min Fick Cardiac Index: 2.84 L/min/m2 Central Aortic Saturation: 97% Pulmonary Artery Saturation: 67%  Aortic valve Data:  27 mmHg peak to peak gradient 15.8 mm HG mean gradient AVA calculated 1.4 cm2  Angiographic Findings:  Left main: No evidence of disease.   Left Anterior Descending Artery: Large caliber vessel that courses to the apex. Large caliber diagonal branch. No evidence of disease.   Circumflex Artery: Large caliber vessel with large obtuse marginal branch. No evidence of disease.   Right Coronary Artery: Moderate sized dominant vessel. No evidence of disease.   Impression: 1. No angiographic evidence of CAD 2. Aortic stenosis, severe by dobutamine echo  Recommendations: Planning in place for aortic valve replacement. Pt will be admitted overnight for hydration post cath given renal insufficiency.    Complications: None; patient tolerated the procedure well.      PCCM Bronchoscopy Procedure Note  The patient was informed of the risks (including but not limited to bleeding, infection, respiratory failure, lung injury, tooth/oral injury) and benefits of the procedure and gave consent, see chart.  Indication: Right middle  lobe broncholith  Location: [facility]  Condition pre procedure: stable  Medications for procedure: Versed 3mg , Fentanyl 57mcg, lidocaine 1% plain topical to vocal cords and endobronchial endothelium  Procedure description: The bronchoscope was introduced through the nose and passed to the bilateral lungs to the level of the subsegmental bronchi throughout the tracheobronchial tree.  Airway exam revealed normal appearing larynx, trachea and left traechobronchial tree was normal in appearance. The right tracheobronchial tree was normal with the exception of the right middle lobe which was completely obstructed by a chronic appearing foreign body. The abnormality had mucosa growing around its edges. The  lesion did not bleed significantly but the patient did have excessive coughing during the procedure.  Procedures performed:  1) endobronchial biopsy RML lesion 2) bronchial washing RML lesion  Specimens sent:  1) BAL > bacterial, fungal and AFB culture 2) Endobronchial biopsies RML > only three taken due to excessive coughing during the procedure  Condition post procedure: stable  EBL: < [id number]  Complications: none  Post procedure diagnosis: Foreign body completely obstructing the right middle lobe orifice  Discussion: She has complete obstruction of her right middle lobe with a history of right middle and lower lobe pneumonia in 2013. Case discussed with Dr. Roxy Manns. While the lesion will not significantly effect her respiratory physiology, she is at excessive risk for pneumonia. Will request thoracic surgery consult for rigid bronchoscopy and foreign body removal.   Patient instructions:  1) We will arrange a thoracic surgery consultation for rigid bronchoscopy  Roselie Awkward, [name] [name] [place] P[facility] Pager: [phone] Cell: [phone] If no response, call [phone]    Impression:  The patient has stage D severe symptomatic low gradient aortic stenosis with underlying severe left ventricular systolic dysfunction. She originally presented 2 years ago with acute respiratory failure and pulmonary edema, but since then she has remained remarkably stable on chronic medical therapy.  Diagnostic cardiac catheterization is notable for the absence of significant coronary artery disease. Patient would likely benefit from elective surgical aortic valve replacement in terms of improved long-term survival and the possibility of some degree of symptomatic improvement. The patient has been found to have a broncholith obstructing the right middle lobe bronchus with chronic right middle lobe collapse. Given her history of multiple episodes of pneumonia in the past, she would likely benefit from rigid  bronchoscopy for foreign body removal to decrease the risks of recurrent pneumonia in the future.    Plan:  I have again discussed matters at length with the patient in the office today. She remains reluctant to schedule elective aortic valve replacement at this time because of problems related to her husband's health. She understands that over time it is possible she might develop relatively sudden acute deterioration in her cardiac function. She is agreeable in proceeding with rigid bronchoscopy in the near future given the high likelihood she would only need to be hospitalized overnight. She understands the risks related to rigid bronchoscopy under general anesthesia as previously outlined by Dr. [name], she plans to go ahead with this procedure in the near future. Once she has recovered we would be happy to schedule elective aortic valve replacement as soon as the patient is willing. She understands that she needs to continue to follow-up with Dr. [name] on a regular basis for close attention to her medical care. All of her questions have been addressed.  I spent in excess of 15 minutes during the conduct of this office consultation and >50% of this time  involved direct face-to-face encounter with the patient for counseling and/or coordination of their care.   Valentina Gu. Roxy Manns, [name] [name] 09/15/2014 11:50 AM

## 2014-09-15 NOTE — Patient Instructions (Signed)
Schedule rigid bronchoscopy as soon as possible with Dr [name]  Schedule follow up appointment with Dr [name]  Return as soon as possible to schedule heart surgery

## 2014-09-16 ENCOUNTER — Encounter: Payer: Self-pay | Admitting: Family Medicine

## 2014-09-16 ENCOUNTER — Ambulatory Visit (INDEPENDENT_AMBULATORY_CARE_PROVIDER_SITE_OTHER): Payer: Commercial Managed Care - HMO | Admitting: Family Medicine

## 2014-09-16 VITALS — BP 118/74 | HR 83 | Resp 16 | Ht 65.0 in | Wt 178.0 lb

## 2014-09-16 DIAGNOSIS — Z794 Long term (current) use of insulin: Principal | ICD-10-CM

## 2014-09-16 DIAGNOSIS — E1065 Type 1 diabetes mellitus with hyperglycemia: Secondary | ICD-10-CM | POA: Diagnosis not present

## 2014-09-16 DIAGNOSIS — E785 Hyperlipidemia, unspecified: Secondary | ICD-10-CM

## 2014-09-16 DIAGNOSIS — I10 Essential (primary) hypertension: Secondary | ICD-10-CM | POA: Diagnosis not present

## 2014-09-16 DIAGNOSIS — IMO0001 Reserved for inherently not codable concepts without codable children: Secondary | ICD-10-CM

## 2014-09-16 DIAGNOSIS — E669 Obesity, unspecified: Secondary | ICD-10-CM

## 2014-09-16 DIAGNOSIS — Z23 Encounter for immunization: Secondary | ICD-10-CM

## 2014-09-16 DIAGNOSIS — E1165 Type 2 diabetes mellitus with hyperglycemia: Principal | ICD-10-CM

## 2014-09-16 LAB — HEMOGLOBIN A1C
Hgb A1c MFr Bld: 7.4 % — ABNORMAL HIGH (ref <5.7)
Mean Plasma Glucose: 166 mg/dL — ABNORMAL HIGH (ref <117)

## 2014-09-16 MED ORDER — ROSUVASTATIN CALCIUM 40 MG PO TABS: 40.0000 mg | ORAL_TABLET | Freq: Every day | ORAL | Status: DC

## 2014-09-16 NOTE — Patient Instructions (Addendum)
F/u in 3 month with foot exam, call if you need me before  Cholesterol is too high, stop the eggs and fried foods  I will try to change you from lipitor to crestor, we will let you know if this goes through  Prevnar today  HBA1C today  Fasting lipid, cmp and EGFr, hBA1C, microalb in 3 months  All the best with surgery  pls schedule eye exam when abl;e

## 2014-09-16 NOTE — Progress Notes (Signed)
Subjective:    Patient ID: [name] [name], female    DOB: 1946/02/05, 69 y.o.   MRN: [id number]  HPI The PT is here for follow up and re-evaluation of chronic medical conditions, medication management and review of any available recent lab and radiology data.  Preventive health is updated, specifically  Cancer screening and Immunization.    Has upcoming valvular heart surgery. The PT denies any adverse reactions to current medications since the last visit.  There are no new concerns.  There are no specific complaints       Review of Systems    See HPI Denies recent fever or chills.Chronic fatigue Denies sinus pressure, nasal congestion, ear pain or sore throat. Denies chest congestion, productive cough or wheezing. Denies chest pains, palpitations and leg swelling Denies abdominal pain, nausea, vomiting,diarrhea or constipation.   Denies dysuria, frequency, hesitancy or incontinence. Chronic  joint pain, swelling and limitation in mobility. Denies headaches, seizures, numbness, or tingling. Denies depression, uncontrolled anxiety or insomnia. Denies skin break down or rash.     Objective:   Physical Exam BP 118/74 mmHg  Pulse 83  Resp 16  Ht 5\' 5"  (1.651 m)  Wt 178 lb (80.74 kg)  BMI 29.62 kg/m2  SpO2 100%  Patient alert and oriented and in no cardiopulmonary distress.  HEENT: No facial asymmetry, EOMI,   oropharynx pink and moist.  Neck supple no JVD, no mass.  Chest: Clear to auscultation bilaterally.  CVS: S1, S2 systolic  murmur, no S3.Regular rate.  ABD: Soft non tender.   Ext: No edema  MS: Adequate ROM spine, shoulders, hips and knees.  Skin: Intact, no ulcerations or rash noted.  Psych: Good eye contact, normal affect. Memory intact not anxious or depressed appearing.  CNS: CN 2-12 intact, power,  normal throughout.no focal deficits noted.       Assessment & Plan:  Diabetes mellitus, insulin dependent (IDDM), uncontrolled Improved, pt  applauded on this, currently at goal Patient educated about the importance of limiting  Carbohydrate intake , the need to commit to daily physical activity for a minimum of 30 minutes , and to commit weight loss.  Diabetic Labs Latest Ref Rng 01/07/2015 12/08/2014 09/16/2014 09/12/2014 05/24/2014  HbA1c <5.7 % - 7.9(H) 7.4(H) - -  Microalbumin <2.0 mg/dL - 0.4 - - -  Micro/Creat Ratio 0.0 - 30.0 mg/g - 2.3 - - -  Chol 0 - 200 mg/dL - 247(H) - 345(H) -  HDL >=46 mg/dL - 74 - 62 -  Calc LDL 0 - 99 mg/dL - 163(H) - 271(H) -  Triglycerides <150 mg/dL - 49 - 59 -  Creatinine 0.44 - 1.00 mg/dL 1.83(H) 1.71(H) - 1.56(H) 1.55(H)   BP/Weight 01/14/2015 01/07/2015 12/25/2014 12/09/2014 11/06/2014 123XX123 0000000  Systolic BP 123XX123 [id number] 123456 [id number] [id number]  Diastolic BP 62 56 69 72 50 68 74  Wt. (Lbs) 184 184.1 183 183.12 183 178 178  BMI 29.71 29.73 31.4 31.42 31.4 29.62 29.62   Foot/eye exam completion dates Latest Ref Rng 12/30/2014 12/09/2014  Eye Exam No Retinopathy No Retinopathy -  Foot exam Order - - -  Foot Form Completion - - Done       Hyperlipidemia LDL goal <100 Uncontrolled, need to change toa ffordable med which is tolerated well Hyperlipidemia:Low fat diet discussed and encouraged.   Lipid Panel  Lab Results  Component Value Date   CHOL 247* 12/08/2014   HDL 74 12/08/2014   LDLCALC 163* 12/08/2014  TRIG 49 12/08/2014   CHOLHDL 3.3 12/08/2014   Updated lab needed at/ before next visit.      Obesity Unchanged. Patient re-educated about  the importance of commitment to a  minimum of 150 minutes of exercise per week.  The importance of healthy food choices with portion control discussed. Encouraged to start a food diary, count calories and to consider  joining a support group. Sample diet sheets offered. Goals set by the patient for the next several months.   Weight /BMI 01/14/2015 01/07/2015 12/25/2014  WEIGHT 184 lb 184 lb 1.6 oz 183 lb  HEIGHT - 5\' 6"  5\' 4"   BMI 29.71  kg/m2 29.73 kg/m2 31.4 kg/m2    Current exercise per week 45 minutes.   Essential hypertension, benign Controlled, no change in medication   Need for vaccination with 13-polyvalent pneumococcal conjugate vaccine After obtaining informed consent, the vaccine is  administered by LPN.

## 2014-09-23 ENCOUNTER — Other Ambulatory Visit: Payer: Self-pay | Admitting: *Deleted

## 2014-09-23 ENCOUNTER — Ambulatory Visit (INDEPENDENT_AMBULATORY_CARE_PROVIDER_SITE_OTHER): Payer: Commercial Managed Care - HMO | Admitting: Thoracic Surgery (Cardiothoracic Vascular Surgery)

## 2014-09-23 ENCOUNTER — Encounter: Payer: Self-pay | Admitting: Thoracic Surgery (Cardiothoracic Vascular Surgery)

## 2014-09-23 VITALS — BP 108/68 | HR 86 | Resp 16 | Ht 65.0 in | Wt 178.0 lb

## 2014-09-23 DIAGNOSIS — T17508A Unspecified foreign body in bronchus causing other injury, initial encounter: Secondary | ICD-10-CM

## 2014-09-23 DIAGNOSIS — T17508D Unspecified foreign body in bronchus causing other injury, subsequent encounter: Secondary | ICD-10-CM

## 2014-09-23 NOTE — Progress Notes (Signed)
HPI:  Mrs. Sader returns today to discuss bronchoscopy for extraction of a foreign body obstructing her right middle lobe bronchus.   She is a 69 year old woman with severe low output aortic stenosis being evaluated for possible T AVR. As part of her evaluation she had a CT of the chest back in June. She was noted to have a calcified broncholith of the right middle lobe bronchus with bronchiectasis and atelectasis of the lateral segment of the right middle lobe. There was also a small left lower lobe nodule. She had flexible bronchoscopy by Dr. [name] in October 2015. He was able to identify a foreign body but was unable to extract it.  I saw her in late October and recommended flexible and rigid bronchoscopy for removal of foreign body. She then was lost to follow-up. She says that her husband has been ill.  She is unhappy to be here today. She said that she does not want to discuss the procedure anymore. She does have a cough. It is nonproductive. It has not changed. She denies any fevers or chills. Her shortness of breath with exertion is unchanged.  Past Medical History  Diagnosis Date  . Essential hypertension, benign   . Hyperlipidemia   . Anxiety   . Aortic stenosis   . Symptomatic PVCs   . Nonischemic cardiomyopathy     No significant obstructive CAD at heart catheterization 05/2014, LVEF 20%  . CKD (chronic kidney disease) stage 3, GFR 30-59 ml/min   . Chronic systolic heart failure   . Obesity   . LBBB (left bundle branch block)   . Depression   . Constipation   . Incidental pulmonary nodule, > 44mm and < 41mm     7 x 4 mm LLL nodule  . Pneumonia 2013  . Diabetes mellitus, type 2   . Arthritis     Past Surgical History  Procedure Laterality Date  . Cesarean section  1987  . Multiple extractions with alveoloplasty N/A 02/27/2014    Procedure: Extraction of tooth #'s 2,4,5,6,7,8,9,10,11,12, 22, 23, 24, 25, 26, 28 with alveoloplasty and bilateral mandibular tori  reductions;  Surgeon: [name], DDS;  Location: [facility];  Service: Oral Surgery;  Laterality: N/A;  . Tubal ligation  1987  . Video bronchoscopy Bilateral 06/05/2014    Procedure: VIDEO BRONCHOSCOPY WITHOUT FLUORO;  Surgeon: [name], MD;  Location: [facility];  Service: Cardiopulmonary;  Laterality: Bilateral;  . Left and right heart catheterization with coronary angiogram N/A 12/05/2011    Procedure: LEFT AND RIGHT HEART CATHETERIZATION WITH CORONARY ANGIOGRAM;  Surgeon: [name], MD;  Location[facility] C[facility] LAB;  Service: Cardiovascular;  Laterality: N/A;  . Left and right heart catheterization with coronary angiogram N/A 05/23/2014    Procedure: LEFT AND RIGHT HEART CATHETERIZATION WITH CORONARY ANGIOGRAM;  Surgeon: [name], MD;  Location: [facility] CATH LAB;  Service: Cardiovascular;  Laterality: N/A;     Current Outpatient Prescriptions  Medication Sig Dispense Refill  . acetaminophen (TYLENOL) 500 MG tablet Take 1,000 mg by mouth every 6 (six) hours as needed for mild pain, fever or headache.     Marland Kitchen aspirin EC 81 MG tablet Take 1 tablet (81 mg total) by mouth daily. 150 tablet 2  . atorvastatin (LIPITOR) 80 MG tablet TAKE ONE TABLET BY MOUTH ONCE DAILY. 30 tablet 3  . carvedilol (COREG) 25 MG tablet Take 1 tablet (25 mg total) by mouth 2 (two) times daily. 60 tablet 6  . fenofibrate (TRICOR) 145  MG tablet Take 145 mg by mouth daily.    . furosemide (LASIX) 40 MG tablet TAKE 1 TABLET BY MOUTH EVERY MORNING. 30 tablet 6  . glucose blood (EASYMAX TEST) test strip For three times daily testing dx E10.65 100 each 5  . Insulin Lispro Prot & Lispro (HUMALOG 50/50 MIX) (50-50) 100 UNIT/ML Kwikpen Inject 10 Units into the skin 2 (two) times daily. 15 mL 11  . Insulin Pen Needle (B-D ULTRAFINE III SHORT PEN) 31G X 8 MM MISC Use as needed to inject insulin 100 each 5  . Multiple Vitamin (MULITIVITAMIN WITH MINERALS) TABS Take 1 tablet by mouth every morning.    .  niacin (NIASPAN) 1000 MG CR tablet Take 1 tablet (1,000 mg total) by mouth at bedtime. 30 tablet 4  . rosuvastatin (CRESTOR) 40 MG tablet Take 1 tablet (40 mg total) by mouth daily. 30 tablet 5  . spironolactone (ALDACTONE) 25 MG tablet Take 1 tablet (25 mg total) by mouth every morning. 90 tablet 3  . [DISCONTINUED] cetirizine (ZYRTEC) 10 MG tablet Take 1 tablet (10 mg total) by mouth daily. 30 tablet 3  . [DISCONTINUED] ferrous sulfate 325 (65 FE) MG EC tablet Take 1 tablet (325 mg total) by mouth 2 (two) times daily. 60 tablet 5  . [DISCONTINUED] FLUoxetine (PROZAC) 10 MG tablet Take 1 tablet (10 mg total) by mouth daily. Take one capsule by mouth once a day 30 tablet 3  . [DISCONTINUED] fluticasone (FLONASE) 50 MCG/ACT nasal spray Place 2 sprays into the nose daily. 16 g 3   No current facility-administered medications for this visit.    Physical Exam BP 108/68 mmHg  Pulse 86  Resp 16  Ht 5\' 5"  (1.651 m)  Wt 178 lb (80.74 kg)  BMI 29.62 kg/m2  SpO2 66% Obese 69 year old woman in no acute distress Alert and oriented 3 with no focal neurologic deficit HEENT unremarkable Neck no thyromegaly or adenopathy Cardiac regular rate and rhythm positive for systolic murmur Lungs faint wheeze right base  Diagnostic Tests: CT scan from June reviewed calcified broncholith noted, chronic atelectasis of the lateral aspect right middle lobe.   Impression: Mrs. Kehr is a 69 year old woman with low output aortic stenosis. She has a history of recurrent pneumonias. Her workup is revealed a calcified broncholith in the bronchus intermedius/right middle lobe bronchus there is partially obstructing the right middle lobe bronchus.   She was advised to have foreign body removal. I recommended that we do flexible and rigid bronchoscopy under general anesthesia. I reviewed the general nature of the procedure with her. We discussed the indications, risks, benefits, and alternatives. She understands that  the risks include, but are not limited to death, MI, stroke, bleeding, possible need for transfusion, possible need for open surgery to correct bleeding, failure to extract foreign body, pneumothorax, as well as the possibility of unforeseeable complications.  She seems frustrated by the need to discuss the procedure and its risks. She does wish to proceed.  Plan:  Flexible and rigid bronchoscopy for removal of foreign body on Friday 10/03/2014.  I personally reviewed the CT scan and chest x-rays.  [name] M.D.

## 2014-09-26 ENCOUNTER — Other Ambulatory Visit: Payer: Self-pay | Admitting: Family Medicine

## 2014-10-01 ENCOUNTER — Inpatient Hospital Stay (HOSPITAL_COMMUNITY)
Admission: RE | Admit: 2014-10-01 | Discharge: 2014-10-01 | Disposition: A | Discharge status: Home / Self Care | Payer: Self-pay | Source: Ambulatory Visit

## 2014-10-01 NOTE — Pre-Procedure Instructions (Signed)
[name] [name]  10/01/2014   Your procedure is scheduled on:  10/03/14  Report to [facility] short stay admitting at 530 AM.  Call this number if you have problems the morning of surgery: [phone]   Remember:   Do not eat food or drink liquids after midnight.   Take these medicines the morning of surgery with A SIP OF WATER: tylenol if needed, carvedilol     STOP all herbel meds, nsaids (aleve,naproxen,advil,ibuprofen) starting now including, vitamins, niacin    No insulin am of surgery   Do not wear jewelry, make-up or nail polish.  Do not wear lotions, powders, or perfumes. You may wear deodorant.  Do not shave 48 hours prior to surgery. Men may shave face and neck.  Do not bring valuables to the hospital.  [facility] is not responsible                  for any belongings or valuables.               Contacts, dentures or bridgework may not be worn into surgery.  Leave suitcase in the car. After surgery it may be brought to your room.  For patients admitted to the hospital, discharge time is determined by your  treatment team..               Patients discharged the day of surgery will not be allowed to drive  home.  Name and phone number of your driver:   Special Instructions:  Special Instructions: [place] - Preparing for Surgery  Before surgery, you can play an important role.  Because skin is not sterile, your skin needs to be as free of germs as possible.  You can reduce the number of germs on you skin by washing with CHG (chlorahexidine gluconate) soap before surgery.  CHG is an antiseptic cleaner which kills germs and bonds with the skin to continue killing germs even after washing.  Please DO NOT use if you have an allergy to CHG or antibacterial soaps.  If your skin becomes reddened/irritated stop using the CHG and inform your nurse when you arrive at Short Stay.  Do not shave (including legs and underarms) for at least 48 hours prior to the first CHG shower.  You  may shave your face.  Please follow these instructions carefully:   1.  Shower with CHG Soap the night before surgery and the morning of Surgery.  2.  If you choose to wash your hair, wash your hair first as usual with your normal shampoo.  3.  After you shampoo, rinse your hair and body thoroughly to remove the Shampoo.  4.  Use CHG as you would any other liquid soap.  You can apply chg directly  to the skin and wash gently with scrungie or a clean washcloth.  5.  Apply the CHG Soap to your body ONLY FROM THE NECK DOWN.  Do not use on open wounds or open sores.  Avoid contact with your eyes ears, mouth and genitals (private parts).  Wash genitals (private parts)       with your normal soap.  6.  Wash thoroughly, paying special attention to the area where your surgery will be performed.  7.  Thoroughly rinse your body with warm water from the neck down.  8.  DO NOT shower/wash with your normal soap after using and rinsing off the CHG Soap.  9.  Pat yourself dry with a clean towel.  10.  Wear clean pajamas.            11.  Place clean sheets on your bed the night of your first shower and do not sleep with pets.  Day of Surgery  Do not apply any lotions/deodorants the morning of surgery.  Please wear clean clothes to the hospital/surgery center.   Please read over the following fact sheets that you were given: Pain Booklet, Coughing and Deep Breathing and Surgical Site Infection Prevention

## 2014-10-02 ENCOUNTER — Other Ambulatory Visit: Payer: Self-pay | Admitting: *Deleted

## 2014-10-03 ENCOUNTER — Encounter (HOSPITAL_COMMUNITY): Admission: RE | Payer: Self-pay | Source: Ambulatory Visit

## 2014-10-03 ENCOUNTER — Ambulatory Visit (HOSPITAL_COMMUNITY)
Admission: RE | Admit: 2014-10-03 | Payer: Commercial Managed Care - HMO | Source: Ambulatory Visit | Admitting: Thoracic Surgery (Cardiothoracic Vascular Surgery)

## 2014-10-03 SURGERY — BRONCHOSCOPY, FLEXIBLE
Anesthesia: General

## 2014-10-22 ENCOUNTER — Telehealth: Payer: Self-pay

## 2014-10-22 MED ORDER — PREDNISONE 5 MG PO TABS: 5.0000 mg | ORAL_TABLET | Freq: Two times a day (BID) | ORAL | Status: DC

## 2014-10-22 MED ORDER — BENZONATATE 100 MG PO CAPS: 100.0000 mg | ORAL_CAPSULE | Freq: Three times a day (TID) | ORAL | Status: DC | PRN

## 2014-10-22 MED ORDER — LORATADINE 10 MG PO TABS: 10.0000 mg | ORAL_TABLET | Freq: Every day | ORAL | Status: DC

## 2014-10-22 NOTE — Telephone Encounter (Signed)
Patient aware and meds sent to requested pharmacy.

## 2014-10-22 NOTE — Addendum Note (Signed)
Addended by: Denman George B on: 10/22/2014 11:29 AM   Modules accepted: Orders

## 2014-10-22 NOTE — Telephone Encounter (Signed)
pls send tessalon perle 100 mg 3 times daily as needed #320 , also pred 5mg  one twic daily #10 Advise oV next week if no better, Ed if fever , chills and unable to breathe

## 2014-10-22 NOTE — Telephone Encounter (Signed)
Patient states that she is having thick congestion that is causing her to wheeze and feels like she is choking.  She is also having a flare in left knee pain.    Husband is currently in hospice care and she is trying to go and see him daily. Please advise.  Did offer loratadine rx.

## 2014-10-24 ENCOUNTER — Other Ambulatory Visit: Payer: Self-pay | Admitting: Family Medicine

## 2014-11-06 ENCOUNTER — Ambulatory Visit (INDEPENDENT_AMBULATORY_CARE_PROVIDER_SITE_OTHER): Payer: Commercial Managed Care - HMO | Admitting: Cardiology

## 2014-11-06 ENCOUNTER — Encounter: Payer: Self-pay | Admitting: Cardiology

## 2014-11-06 VITALS — BP 102/50 | HR 85 | Ht 64.0 in | Wt 183.0 lb

## 2014-11-06 DIAGNOSIS — N183 Chronic kidney disease, stage 3 unspecified: Secondary | ICD-10-CM

## 2014-11-06 DIAGNOSIS — I350 Nonrheumatic aortic (valve) stenosis: Secondary | ICD-10-CM | POA: Diagnosis not present

## 2014-11-06 DIAGNOSIS — I428 Other cardiomyopathies: Secondary | ICD-10-CM

## 2014-11-06 DIAGNOSIS — I429 Cardiomyopathy, unspecified: Secondary | ICD-10-CM

## 2014-11-06 DIAGNOSIS — I5022 Chronic systolic (congestive) heart failure: Secondary | ICD-10-CM

## 2014-11-06 NOTE — Patient Instructions (Signed)
Your physician recommends that you schedule a follow-up appointment in:  3 months with Dr McDowell     Your physician recommends that you continue on your current medications as directed. Please refer to the Current Medication list given to you today.      Thank you for choosing [facility] !         

## 2014-11-06 NOTE — Progress Notes (Signed)
Cardiology Office Note  Date: 11/06/2014   ID: Floree, Litwak 03/24/1946, MRN [id number]  PCP: [name], MD  Primary Cardiologist: Rozann Lesches, MD   Chief Complaint  Patient presents with  . Cardiomyopathy  . Aortic Stenosis    History of Present Illness: [name] [name] is a 69 y.o. female last seen in September 2015. She has a history of low gradient, severe aortic stenosis and nonischemic cardiomyopathy. She has already seen Dr. Roxy Manns for discussion regarding elective AVR, although has deferred some of the aspects of her workup (including pursuing surgery) related to the declining health of her husband. A remaining issue is that she has a calcified broncholith within the bronchus intermedius/right middle lobe bronchus that is partially obstructive, and it has been advised that this be removed, to require rigid bronchoscopy following assessment with Dr. [name]. It looks like this procedure was actually scheduled for February 19, but the patient canceled around which time her husband went into a Hospice program. She was spending every day with him and tells me that he slowly declined and recently passed away on Nov 05, 2022. She is still early in the grieving process, but indicates that she is starting to re-center her thinking on taking care of her heart valve and does plan to reschedule her evaluation with [facility] soon.  She reports compliance with her medications which are outlined below. Heart rate and blood pressure are well controlled. Fortunately, she has had no substantial decline in exertional shortness of breath, no angina symptoms.   Past Medical History  Diagnosis Date  . Essential hypertension, benign   . Hyperlipidemia   . Anxiety   . Aortic stenosis   . Symptomatic PVCs   . Nonischemic cardiomyopathy     No significant obstructive CAD at heart catheterization 05/2014, LVEF 20%  . CKD (chronic kidney disease) stage 3, GFR 30-59 ml/min   . Chronic  systolic heart failure   . Obesity   . LBBB (left bundle branch block)   . Depression   . Constipation   . Incidental pulmonary nodule, > 46mm and < 74mm     7 x 4 mm LLL nodule  . Pneumonia 2013  . Diabetes mellitus, type 2   . Arthritis     Past Surgical History  Procedure Laterality Date  . Cesarean section  1987  . Multiple extractions with alveoloplasty N/A 02/27/2014    Procedure: Extraction of tooth #'s 2,4,5,6,7,8,9,10,11,12, 22, 23, 24, 25, 26, 28 with alveoloplasty and bilateral mandibular tori reductions;  Surgeon: [name], DDS;  Location: [place];  Service: Oral Surgery;  Laterality: N/A;  . Tubal ligation  1987  . Video bronchoscopy Bilateral 06/05/2014    Procedure: VIDEO BRONCHOSCOPY WITHOUT FLUORO;  Surgeon: [name], MD;  Location: [place];  Service: Cardiopulmonary;  Laterality: Bilateral;  . Left and right heart catheterization with coronary angiogram N/A 12/05/2011    Procedure: LEFT AND RIGHT HEART CATHETERIZATION WITH CORONARY ANGIOGRAM;  Surgeon: [name], MD;  Location[facility] CATH LAB;  Service: Cardiovascular;  Laterality: N/A;  . Left and right heart catheterization with coronary angiogram N/A 05/23/2014    Procedure: LEFT AND RIGHT HEART CATHETERIZATION WITH CORONARY ANGIOGRAM;  Surgeon: [name], MD;  Location: [facility] CATH LAB;  Service: Cardiovascular;  Laterality: N/A;    Current Outpatient Prescriptions  Medication Sig Dispense Refill  . acetaminophen (TYLENOL) 500 MG tablet Take 1,000 mg by mouth every 6 (six) hours as needed for mild  pain, fever or headache.     Marland Kitchen aspirin EC 81 MG tablet Take 1 tablet (81 mg total) by mouth daily. 150 tablet 2  . benzonatate (TESSALON PERLES) 100 MG capsule Take 1 capsule (100 mg total) by mouth 3 (three) times daily as needed for cough. 30 capsule 0  . carvedilol (COREG) 25 MG tablet Take 1 tablet (25 mg total) by mouth 2 (two) times daily. 60 tablet 6  . fenofibrate (TRICOR) 145  MG tablet TAKE ONE TABLET BY MOUTH ONCE DAILY. 30 tablet 2  . furosemide (LASIX) 40 MG tablet TAKE 1 TABLET BY MOUTH EVERY MORNING. 30 tablet 6  . glucose blood (EASYMAX TEST) test strip For three times daily testing dx E10.65 100 each 5  . Insulin Lispro Prot & Lispro (HUMALOG 50/50 MIX) (50-50) 100 UNIT/ML Kwikpen Inject 10 Units into the skin 2 (two) times daily. 15 mL 11  . Insulin Pen Needle (B-D ULTRAFINE III SHORT PEN) 31G X 8 MM MISC Use as needed to inject insulin 100 each 5  . loratadine (CLARITIN) 10 MG tablet Take 1 tablet (10 mg total) by mouth daily. 30 tablet 2  . Multiple Vitamin (MULITIVITAMIN WITH MINERALS) TABS Take 1 tablet by mouth every morning.    . niacin (NIASPAN) 1000 MG CR tablet Take 1 tablet (1,000 mg total) by mouth at bedtime. 30 tablet 4  . rosuvastatin (CRESTOR) 40 MG tablet Take 1 tablet (40 mg total) by mouth daily. 30 tablet 5  . spironolactone (ALDACTONE) 25 MG tablet Take 1 tablet (25 mg total) by mouth every morning. 90 tablet 3  . [DISCONTINUED] cetirizine (ZYRTEC) 10 MG tablet Take 1 tablet (10 mg total) by mouth daily. 30 tablet 3  . [DISCONTINUED] ferrous sulfate 325 (65 FE) MG EC tablet Take 1 tablet (325 mg total) by mouth 2 (two) times daily. 60 tablet 5  . [DISCONTINUED] FLUoxetine (PROZAC) 10 MG tablet Take 1 tablet (10 mg total) by mouth daily. Take one capsule by mouth once a day 30 tablet 3  . [DISCONTINUED] fluticasone (FLONASE) 50 MCG/ACT nasal spray Place 2 sprays into the nose daily. 16 g 3   No current facility-administered medications for this visit.    Allergies:  Penicillins   Social History: The patient  reports that she has never smoked. She has never used smokeless tobacco. She reports that she does not drink alcohol or use illicit drugs.   ROS:  Please see the history of present illness. Otherwise, complete review of systems is positive for some trouble with insomnia intermittently. Appetite is stable, no palpitations or syncope.  All other systems are reviewed and negative.   Physical Exam: VS:  BP 102/50 mmHg  Pulse 85  Ht 5\' 4"  (1.626 m)  Wt 183 lb (83.008 kg)  BMI 31.40 kg/m2  SpO2 99%, BMI Body mass index is 31.4 kg/(m^2).  Wt Readings from Last 3 Encounters:  11/06/14 183 lb (83.008 kg)  09/23/14 178 lb (80.74 kg)  09/16/14 178 lb (80.74 kg)     Overweight woman, in no distress  HEENT: Conjunctiva and lids normal, oropharynx clear.  Neck: Supple, no carotid bruits, no thyromegaly.  Lungs: Clear to auscultation, nonlabored breathing at rest.  Cardiac: Regular rate and rhythm, indistinct PMI, no S3, 4/6 systolic murmur, no pericardial rub.  Abdomen: Soft, nontender, bowel sounds present, no guarding or rebound.  Extremities: Trace edema, distal pulses 2+.  Skin: Warm and dry.  Musculoskeletal: No kyphosis.  Neuropsychiatric: Alert and oriented x3, affect  grossly appropriate.   ECG: ECG is not ordered today.  Recent Labwork: 05/21/2014: Hemoglobin 11.5*; Platelets 245.0 09/12/2014: ALT 18; AST 23; BUN 31*; Creatinine 1.56*; Potassium 4.3; Sodium 136; TSH 2.455     Component Value Date/Time   CHOL 345* 09/12/2014 1056   TRIG 59 09/12/2014 1056   HDL 62 09/12/2014 1056   CHOLHDL 5.6 09/12/2014 1056   VLDL 12 09/12/2014 1056   LDLCALC 271* 09/12/2014 1056    Other Studies Reviewed Today:  1. Dobutamine echocardiogram 12/23/2013: Impressions:  - Dobutamine echocardiogram to assess severity of AS. Note baseline LV function appeared to be severely reduced on parasternal images but no further imaging of LV function performed. Baseline images reveal peak velocity of 3.75 m/s with mean gradient of 31 mmHg and AVA of 0.4 cm2; with dobutamine at 20 ug, peak velocity increased to 4.2 m/s and mean gradient of 38 mmHg with AVA 0.4 cm2. Findings consistent with severe AS.  2. Cardiac catheterization 05/23/2014: Angiographic Findings:  Left main: No evidence of disease.    Left Anterior Descending Artery: Large caliber vessel that courses to the apex. Large caliber diagonal branch. No evidence of disease.   Circumflex Artery: Large caliber vessel with large obtuse marginal branch. No evidence of disease.   Right Coronary Artery: Moderate sized dominant vessel. No evidence of disease.   Impression: 1. No angiographic evidence of CAD 2. Aortic stenosis, severe by dobutamine echo   ASSESSMENT AND PLAN:  1. Severe, low gradient aortic stenosis. Patient has already been evaluated by Dr. Roxy Manns. Pending stabilization of pulmonary status, she will be pursuing surgical AVR. No change in current medical regimen.  2. Stable chronic systolic heart failure with nonischemic cardiomyopathy, no coronary artery disease by cardiac catheterization October 2015.  3. CKD, stage 3.  4. 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 She plans to contact Dr. [name] regarding rescheduling the rigid bronchoscopy to address this issue prior to pursuing AVR.  5. Grief reaction, patient's husband recently passed away in Hospice program on March 10. She reports family support from children in town. She seems to be progressing normally in discussion with her today, and is now starting to refocus her attention on her own health.  Current medicines were reviewed at length with the patient today.  Disposition: FU with me in 3 months.   Signed, [name], MD, [facility] 11/06/2014 11:08 AM    [facility] [place] [facility] [place]. [address] [place], [place] [address] Phone: [phone]; Fax: [phone]

## 2014-11-10 ENCOUNTER — Other Ambulatory Visit: Payer: Self-pay | Admitting: Family Medicine

## 2014-12-05 ENCOUNTER — Telehealth: Payer: Self-pay | Admitting: Family Medicine

## 2014-12-05 MED ORDER — ALPRAZOLAM 0.25 MG PO TABS: 0.2500 mg | ORAL_TABLET | Freq: Every evening | ORAL | Status: DC | PRN

## 2014-12-05 NOTE — Telephone Encounter (Signed)
Please call patient back and let her know please

## 2014-12-05 NOTE — Telephone Encounter (Signed)
Please let her know that I am sorry to hear about the loss of her husband, she is in our thoughts and prayers Pls send short course of xanax for her , I will print and sign

## 2014-12-05 NOTE — Telephone Encounter (Signed)
Patient aware and med sent to pharmacy.  

## 2014-12-09 ENCOUNTER — Encounter: Payer: Self-pay | Admitting: Family Medicine

## 2014-12-09 ENCOUNTER — Ambulatory Visit (INDEPENDENT_AMBULATORY_CARE_PROVIDER_SITE_OTHER): Payer: Commercial Managed Care - HMO | Admitting: Family Medicine

## 2014-12-09 VITALS — BP 120/72 | HR 78 | Resp 18 | Ht 64.0 in | Wt 183.1 lb

## 2014-12-09 DIAGNOSIS — I10 Essential (primary) hypertension: Secondary | ICD-10-CM

## 2014-12-09 DIAGNOSIS — E1065 Type 1 diabetes mellitus with hyperglycemia: Secondary | ICD-10-CM | POA: Diagnosis not present

## 2014-12-09 DIAGNOSIS — I5022 Chronic systolic (congestive) heart failure: Secondary | ICD-10-CM

## 2014-12-09 DIAGNOSIS — N183 Chronic kidney disease, stage 3 unspecified: Secondary | ICD-10-CM

## 2014-12-09 DIAGNOSIS — E1165 Type 2 diabetes mellitus with hyperglycemia: Principal | ICD-10-CM

## 2014-12-09 DIAGNOSIS — Z794 Long term (current) use of insulin: Principal | ICD-10-CM

## 2014-12-09 DIAGNOSIS — IMO0001 Reserved for inherently not codable concepts without codable children: Secondary | ICD-10-CM

## 2014-12-09 DIAGNOSIS — E785 Hyperlipidemia, unspecified: Secondary | ICD-10-CM

## 2014-12-09 DIAGNOSIS — M25562 Pain in left knee: Secondary | ICD-10-CM

## 2014-12-09 DIAGNOSIS — G4700 Insomnia, unspecified: Secondary | ICD-10-CM

## 2014-12-09 DIAGNOSIS — E669 Obesity, unspecified: Secondary | ICD-10-CM

## 2014-12-09 LAB — LIPID PANEL
Cholesterol: 247 mg/dL — ABNORMAL HIGH (ref 0–200)
HDL: 74 mg/dL (ref >=46)
LDL Cholesterol: 163 mg/dL — ABNORMAL HIGH (ref 0–99)
Total CHOL/HDL Ratio: 3.3 Ratio
Triglycerides: 49 mg/dL (ref <150)
VLDL: 10 mg/dL (ref 0–40)

## 2014-12-09 LAB — COMPLETE METABOLIC PANEL WITH GFR
ALT: 17 U/L (ref 0–35)
AST: 21 U/L (ref 0–37)
Albumin: 3.6 g/dL (ref 3.5–5.2)
Alkaline Phosphatase: 32 U/L — ABNORMAL LOW (ref 39–117)
BUN: 32 mg/dL — ABNORMAL HIGH (ref 6–23)
CO2: 25 mEq/L (ref 19–32)
Calcium: 9.5 mg/dL (ref 8.4–10.5)
Chloride: 106 mEq/L (ref 96–112)
Creat: 1.71 mg/dL — ABNORMAL HIGH (ref 0.50–1.10)
GFR, Est African American: 35 mL/min — ABNORMAL LOW
GFR, Est Non African American: 30 mL/min — ABNORMAL LOW
Glucose, Bld: 140 mg/dL — ABNORMAL HIGH (ref 70–99)
Potassium: 3.9 mEq/L (ref 3.5–5.3)
Sodium: 143 mEq/L (ref 135–145)
Total Bilirubin: 0.4 mg/dL (ref 0.2–1.2)
Total Protein: 6.9 g/dL (ref 6.0–8.3)

## 2014-12-09 LAB — MICROALBUMIN / CREATININE URINE RATIO
Creatinine, Urine: 177.4 mg/dL
Microalb Creat Ratio: 2.3 mg/g (ref 0.0–30.0)
Microalb, Ur: 0.4 mg/dL (ref <2.0)

## 2014-12-09 LAB — HEMOGLOBIN A1C
Hgb A1c MFr Bld: 7.9 % — ABNORMAL HIGH (ref <5.7)
Mean Plasma Glucose: 180 mg/dL — ABNORMAL HIGH (ref <117)

## 2014-12-09 MED ORDER — EZETIMIBE 10 MG PO TABS: 10.0000 mg | ORAL_TABLET | Freq: Every day | ORAL | Status: DC

## 2014-12-09 NOTE — Patient Instructions (Addendum)
Annual wellness in 2.5 month, call if you need me before please  Continue xanax at night as needed one tablet for the next up to 2 months, but plan on stopping after that  You are referred for diabetic eye exam to Dr Gershon Crane  New extra medication to help to bring down cholesterol  Sympathy re loss of your  Husband

## 2014-12-09 NOTE — Progress Notes (Signed)
Subjective:    Patient ID: [name] [name], female    DOB: October 09, 1945, 69 y.o.   MRN: [id number]  HPI   [name] [name]     MRN: [id number]      DOB: 30-Apr-1946   HPI [name] is here for follow up and re-evaluation of chronic medical conditions, medication management and review of any available recent lab and radiology data.  Preventive health is updated, specifically  Cancer screening and Immunization.   Questions or concerns regarding consultations or procedures which the PT has had in the interim are  Addressed.Hopes to have heart valve replaced in the near future, she has completed her dental work The PT denies any adverse reactions to current medications since the last visit.   ROS Denies recent fever or chills. Denies sinus pressure, nasal congestion, ear pain or sore throat. Denies chest congestion, productive cough or wheezing. Denies chest pains, palpitations and leg swelling Denies abdominal pain, nausea, vomiting,diarrhea or constipation.   Denies dysuria, frequency, hesitancy or incontinence. C/o left knee pain and stiffness Denies headaches, seizures, numbness, or tingling. Denies skin break down or rash. C/o depression and a insomnia overtaken to some extent by recent loss of her husband who had been ailing for a while   PE  BP 120/72 mmHg  Pulse 78  Resp 18  Ht 5\' 4"  (1.626 m)  Wt 183 lb 1.9 oz (83.063 kg)  BMI 31.42 kg/m2  SpO2 98%  Patient alert and oriented and in no ciardopulmonary distress.  HEENT: No facial asymmetry, EOMI,   oropharynx pink, edentulous, and moist.  Neck decreased ROM, no JVD, no mass.  Chest: Clear to auscultation bilaterally.  CVS: S1, S2 systolic murmur, no S3.Regular rate.  ABD: Soft non tender.   Ext: No edema  MS: Adequate though reduced  ROM spine, shoulders, hips and markedly  reduced in left knee.which is also swollen and deformed  Skin: Intact, no ulcerations or rash noted.  Psych: Good eye contact, normal  affect. Memory intact not anxious but tearful at times and depressed appearing.  CNS: CN 2-12 intact, power,  normal throughout.no focal deficits noted.   Assessment & Plan   Essential hypertension, benign Controlled, no change in medication    Chronic systolic heart failure Stable, no current s/s of decompensation   Diabetes mellitus, insulin dependent (IDDM), uncontrolled Deteriorated, recommend dose remains unchanged as pt has been under a lot of stress recently and been unable to focus on appropriate diet Patient educated about the importance of limiting  Carbohydrate intake , the need to commit to daily physical activity for a minimum of 30 minutes , and to commit weight loss.  Diabetic Labs Latest Ref Rng 12/08/2014 09/16/2014 09/12/2014 05/24/2014 05/23/2014  HbA1c <5.7 % 7.9(H) 7.4(H) - - -  Microalbumin <2.0 mg/dL 0.4 - - - -  Micro/Creat Ratio 0.0 - 30.0 mg/g 2.3 - - - -  Chol 0 - 200 mg/dL 247(H) - 345(H) - -  HDL >=46 mg/dL 74 - 62 - -  Calc LDL 0 - 99 mg/dL 163(H) - 271(H) - -  Triglycerides <150 mg/dL 49 - 59 - -  Creatinine 0.50 - 1.10 mg/dL 1.71(H) - 1.56(H) 1.55(H) 1.51(H)   BP/Weight 12/09/2014 11/06/2014 09/23/2014 09/16/2014 09/15/2014 06/10/2014 [id number]  Systolic BP 123456 [id number] 123456 A999333 123456 [id number]  Diastolic BP 72 50 68 74 75 77 69  Wt. (Lbs) 183.12 183 178 178 178.5 172.6 -  BMI 31.42 31.4 29.62 29.62 29.7 28.72 -  Foot/eye exam completion dates 12/09/2014 09/20/2013  Foot exam Order - -  Foot Form Completion Done Done        Hyperlipidemia LDL goal <100 Improved but uncontrolled Hyperlipidemia:Low fat diet discussed and encouraged. Additional agent added   Lipid Panel  Lab Results  Component Value Date   CHOL 247* 12/08/2014   HDL 74 12/08/2014   LDLCALC 163* 12/08/2014   TRIG 49 12/08/2014   CHOLHDL 3.3 12/08/2014           CKD (chronic kidney disease) stage 3, GFR 30-59 ml/min Importance of improving blood sugar control is stressed also  avoidance of nephrotoxic drugs, esp NSAIDS   Obesity Improved Patient re-educated about  the importance of commitment to a  minimum of 150 minutes of exercise per week.  The importance of healthy food choices with portion control discussed. Encouraged to start a food diary, count calories and to consider  joining a support group. Sample diet sheets offered. Goals set by the patient for the next several months.   Weight /BMI 12/09/2014 11/06/2014 09/23/2014  WEIGHT 183 lb 1.9 oz 183 lb 178 lb  HEIGHT 5\' 4"  5\' 4"  5\' 5"   BMI 31.42 kg/m2 31.4 kg/m2 29.62 kg/m2    Current exercise per week **0* minutes.     Insomnia Sleep hygiene reviewed and written information offered also. Prescription sent for  medication needed. Short term use of xanax , and she is aware and agrees   Knee pain, left Tylenol recommended for pain relief, denies instability or fall        Review of Systems     Objective:   Physical Exam        Assessment & Plan:

## 2014-12-16 DIAGNOSIS — M25562 Pain in left knee: Secondary | ICD-10-CM | POA: Insufficient documentation

## 2014-12-16 NOTE — Assessment & Plan Note (Signed)
Tylenol recommended for pain relief, denies instability or fall

## 2014-12-16 NOTE — Assessment & Plan Note (Signed)
Improved Patient re-educated about  the importance of commitment to a  minimum of 150 minutes of exercise per week.  The importance of healthy food choices with portion control discussed. Encouraged to start a food diary, count calories and to consider  joining a support group. Sample diet sheets offered. Goals set by the patient for the next several months.   Weight /BMI 12/09/2014 11/06/2014 09/23/2014  WEIGHT 183 lb 1.9 oz 183 lb 178 lb  HEIGHT 5\' 4"  5\' 4"  5\' 5"   BMI 31.42 kg/m2 31.4 kg/m2 29.62 kg/m2    Current exercise per week **0* minutes.

## 2014-12-16 NOTE — Assessment & Plan Note (Addendum)
Importance of improving blood sugar control is stressed also avoidance of nephrotoxic drugs, esp NSAIDS

## 2014-12-16 NOTE — Assessment & Plan Note (Signed)
Stable, no current s/s of decompensation

## 2014-12-16 NOTE — Assessment & Plan Note (Addendum)
Deteriorated, recommend dose remains unchanged as pt has been under a lot of stress recently and been unable to focus on appropriate diet Patient educated about the importance of limiting  Carbohydrate intake , the need to commit to daily physical activity for a minimum of 30 minutes , and to commit weight loss.  Diabetic Labs Latest Ref Rng 12/08/2014 09/16/2014 09/12/2014 05/24/2014 05/23/2014  HbA1c <5.7 % 7.9(H) 7.4(H) - - -  Microalbumin <2.0 mg/dL 0.4 - - - -  Micro/Creat Ratio 0.0 - 30.0 mg/g 2.3 - - - -  Chol 0 - 200 mg/dL 247(H) - 345(H) - -  HDL >=46 mg/dL 74 - 62 - -  Calc LDL 0 - 99 mg/dL 163(H) - 271(H) - -  Triglycerides <150 mg/dL 49 - 59 - -  Creatinine 0.50 - 1.10 mg/dL 1.71(H) - 1.56(H) 1.55(H) 1.51(H)   BP/Weight 12/09/2014 11/06/2014 09/23/2014 09/16/2014 09/15/2014 06/10/2014 [id number]  Systolic BP 123456 A999333 [id number] 123456 A999333 123456 [id number]  Diastolic BP 72 50 68 74 75 77 69  Wt. (Lbs) 183.12 183 178 178 178.5 172.6 -  BMI 31.42 31.4 29.62 29.62 29.7 28.72 -   Foot/eye exam completion dates 12/09/2014 09/20/2013  Foot exam Order - -  Foot Form Completion Done Done

## 2014-12-16 NOTE — Assessment & Plan Note (Signed)
Improved but uncontrolled Hyperlipidemia:Low fat diet discussed and encouraged. Additional agent added   Lipid Panel  Lab Results  Component Value Date   CHOL 247* 12/08/2014   HDL 74 12/08/2014   LDLCALC 163* 12/08/2014   TRIG 49 12/08/2014   CHOLHDL 3.3 12/08/2014

## 2014-12-16 NOTE — Assessment & Plan Note (Signed)
Controlled, no change in medication  

## 2014-12-16 NOTE — Assessment & Plan Note (Signed)
Sleep hygiene reviewed and written information offered also. Prescription sent for  medication needed. Short term use of xanax , and she is aware and agrees

## 2014-12-22 ENCOUNTER — Other Ambulatory Visit: Payer: Self-pay | Admitting: Family Medicine

## 2014-12-23 ENCOUNTER — Other Ambulatory Visit: Payer: Self-pay | Admitting: *Deleted

## 2014-12-24 ENCOUNTER — Ambulatory Visit: Payer: Commercial Managed Care - HMO | Admitting: Family Medicine

## 2014-12-25 ENCOUNTER — Encounter: Payer: Self-pay | Admitting: Thoracic Surgery (Cardiothoracic Vascular Surgery)

## 2014-12-25 ENCOUNTER — Ambulatory Visit (INDEPENDENT_AMBULATORY_CARE_PROVIDER_SITE_OTHER): Payer: Commercial Managed Care - HMO | Admitting: Thoracic Surgery (Cardiothoracic Vascular Surgery)

## 2014-12-25 ENCOUNTER — Other Ambulatory Visit: Payer: Self-pay | Admitting: *Deleted

## 2014-12-25 VITALS — BP 120/69 | HR 83 | Resp 20 | Ht 64.0 in | Wt 183.0 lb

## 2014-12-25 DIAGNOSIS — T17508D Unspecified foreign body in bronchus causing other injury, subsequent encounter: Secondary | ICD-10-CM

## 2014-12-25 DIAGNOSIS — R911 Solitary pulmonary nodule: Secondary | ICD-10-CM

## 2014-12-25 DIAGNOSIS — I350 Nonrheumatic aortic (valve) stenosis: Secondary | ICD-10-CM | POA: Diagnosis not present

## 2014-12-25 DIAGNOSIS — J9809 Other diseases of bronchus, not elsewhere classified: Secondary | ICD-10-CM

## 2014-12-25 DIAGNOSIS — J9819 Other pulmonary collapse: Secondary | ICD-10-CM

## 2014-12-25 NOTE — Progress Notes (Signed)
HPI:  69 year old woman returns to discuss bronchoscopy for a foreign body.  [name] [name] is a 69 year old woman with multiple medical problems including low output aortic stenosis, chronic systolic heart failure, hypertension, chronic kidney disease, type 2 diabetes, arthritis, depression, recurrent right middle lobe pneumonia, broncholith and right middle lobe bronchus.  She was being evaluated for TAVR last fall. She had a history of recurrent right middle lobe pneumonia and workup had revealed a broncholith, possibly a tooth, in the right middle lobe bronchus. Dr. [name] had done a flexible bronchoscopy was unable to remove the obstruction. Dr. Roxy Manns felt the broncholith should be removed prior to her aortic valve procedure to decrease her risk for perioperative pneumonia.  I saw her first in October. She was dealing with her husband's illness at the time and was lost to follow-up for several months. She was scheduled for bronchoscopy in February, but canceled that procedure and was lost to follow-up again. Her husband recently passed away and she now wishes to proceed. She has been very depressed over her husband's death. She says that she's not been having any significant wheezing. Think she is only used her inhaler once the past month. She denies chest pain. She does get short of breath with exertion.  Past Medical History  Diagnosis Date  . Essential hypertension, benign   . Hyperlipidemia   . Anxiety   . Aortic stenosis   . Symptomatic PVCs   . Nonischemic cardiomyopathy     No significant obstructive CAD at heart catheterization 05/2014, LVEF 20%  . CKD (chronic kidney disease) stage 3, GFR 30-59 ml/min   . Chronic systolic heart failure   . Obesity   . LBBB (left bundle branch block)   . Depression   . Constipation   . Incidental pulmonary nodule, > 63mm and < 65mm     7 x 4 mm LLL nodule  . Pneumonia 2013  . Diabetes mellitus, type 2   . Arthritis    Past Surgical History   Procedure Laterality Date  . Cesarean section  1987  . Multiple extractions with alveoloplasty N/A 02/27/2014    Procedure: Extraction of tooth #'s 2,4,5,6,7,8,9,10,11,12, 22, 23, 24, 25, 26, 28 with alveoloplasty and bilateral mandibular tori reductions;  Surgeon: [name], DDS;  Location: [facility];  Service: Oral Surgery;  Laterality: N/A;  . Tubal ligation  1987  . Video bronchoscopy Bilateral 06/05/2014    Procedure: VIDEO BRONCHOSCOPY WITHOUT FLUORO;  Surgeon: [name], MD;  Location: [facility];  Service: Cardiopulmonary;  Laterality: Bilateral;  . Left and right heart catheterization with coronary angiogram N/A 12/05/2011    Procedure: LEFT AND RIGHT HEART CATHETERIZATION WITH CORONARY ANGIOGRAM;  Surgeon: [name], MD;  Location: [facility] CATH LAB;  Service: Cardiovascular;  Laterality: N/A;  . Left and right heart catheterization with coronary angiogram N/A 05/23/2014    Procedure: LEFT AND RIGHT HEART CATHETERIZATION WITH CORONARY ANGIOGRAM;  Surgeon: [name], MD;  Location: [facility] CATH LAB;  Service: Cardiovascular;  Laterality: N/A;     Current Outpatient Prescriptions  Medication Sig Dispense Refill  . acetaminophen (TYLENOL) 500 MG tablet Take 1,000 mg by mouth every 6 (six) hours as needed for mild pain, fever or headache.     . ALPRAZolam (XANAX) 0.25 MG tablet Take 1 tablet (0.25 mg total) by mouth at bedtime as needed for anxiety. 15 tablet 0  . aspirin EC 81 MG tablet Take 1 tablet (81 mg total) by mouth daily. 150 tablet  2  . carvedilol (COREG) 25 MG tablet Take 1 tablet (25 mg total) by mouth 2 (two) times daily. 60 tablet 6  . ezetimibe (ZETIA) 10 MG tablet Take 1 tablet (10 mg total) by mouth daily. 30 tablet 4  . fenofibrate (TRICOR) 145 MG tablet TAKE ONE TABLET BY MOUTH ONCE DAILY. 30 tablet 3  . furosemide (LASIX) 40 MG tablet TAKE 1 TABLET BY MOUTH EVERY MORNING. 30 tablet 6  . glucose blood (EASYMAX TEST) test strip For three  times daily testing dx E10.65 100 each 5  . Insulin Lispro Prot & Lispro (HUMALOG 50/50 MIX) (50-50) 100 UNIT/ML Kwikpen Inject 10 Units into the skin 2 (two) times daily. (Patient taking differently: Inject 12 Units into the skin 2 (two) times daily. ) 15 mL 11  . Insulin Pen Needle (B-D ULTRAFINE III SHORT PEN) 31G X 8 MM MISC Use as needed to inject insulin 100 each 5  . loratadine (CLARITIN) 10 MG tablet Take 1 tablet (10 mg total) by mouth daily. 30 tablet 2  . Multiple Vitamin (MULITIVITAMIN WITH MINERALS) TABS Take 1 tablet by mouth every morning.    . niacin (NIASPAN) 1000 MG CR tablet TAKE (1) TABLET BY MOUTH AT BEDTIME. 30 tablet 2  . rosuvastatin (CRESTOR) 40 MG tablet Take 1 tablet (40 mg total) by mouth daily. 30 tablet 5  . spironolactone (ALDACTONE) 25 MG tablet Take 1 tablet (25 mg total) by mouth every morning. 90 tablet 3  . [DISCONTINUED] cetirizine (ZYRTEC) 10 MG tablet Take 1 tablet (10 mg total) by mouth daily. 30 tablet 3  . [DISCONTINUED] ferrous sulfate 325 (65 FE) MG EC tablet Take 1 tablet (325 mg total) by mouth 2 (two) times daily. 60 tablet 5  . [DISCONTINUED] FLUoxetine (PROZAC) 10 MG tablet Take 1 tablet (10 mg total) by mouth daily. Take one capsule by mouth once a day 30 tablet 3  . [DISCONTINUED] fluticasone (FLONASE) 50 MCG/ACT nasal spray Place 2 sprays into the nose daily. 16 g 3   No current facility-administered medications for this visit.   Family History  Problem Relation Age of Onset  . Heart disease Mother   . Heart attack Mother 16  . Diabetes Mother   . Hypertension Mother   . Colon cancer Father 64  . Breast cancer Sister   . Diabetes Brother   . Hypertension Brother   . Stroke Brother    History   Social History  . Marital Status: Married    Spouse Name: N/A  . Number of Children: 5  . Years of Education: N/A   Occupational History  . Retired    Social History Main Topics  . Smoking status: Never Smoker   . Smokeless tobacco:  Never Used  . Alcohol Use: No  . Drug Use: No  . Sexual Activity: No   Other Topics Concern  . Not on file   Social History Narrative     Physical Exam BP 120/69 mmHg  Pulse 83  Resp 20  Ht 5\' 4"  (1.626 m)  Wt 183 lb (83.008 kg)  BMI 31.40 kg/m2  SpO2 8% Obese 69 year old woman in no acute distress Depressed affect, tearful Alert and oriented 3 with no focal neurologic deficit No cervical or supraclavicular adenopathy Cardiac regular rate and rhythm with a 2/6 systolic murmur Lungs clear, no wheezing No peripheral edema  Diagnostic Tests: none  Impression: 69 year old woman with a history of a broncholith in the right middle lobe bronchus with recurrent pneumonias.  Interestingly she has not had a pneumonia over the past 8 or 9 months since I first saw her. She needs bronchoscopy for foreign body removal. We would plan to do this under general anesthesia. Plan to do flexible bronchoscopy initially, but rigid bronchoscopy will probably be necessary to remove the foreign body.  I discussed the nature of the procedure with her. Given her cardiac issues will plan to keep her overnight for observation afterwards. She will need general anesthesia. I discussed the indications, risks, benefits, and alternatives. She understands the risks include, but are not limited to death, MI, DVT, PE, bleeding, pneumothorax, inability to remove the foreign body, as well as the possibility of unforeseeable complications.  She says that she wishes to proceed.  Unfortunately has been almost a year since her last scan. I would not be comfortable taking her to the operating room without repeating her CT in the interim.  Plan: CT chest  Flexible and rigid bronchoscopy on 01/14/2015  [name], MD [facility] [phone]

## 2014-12-26 ENCOUNTER — Telehealth: Payer: Self-pay

## 2014-12-26 ENCOUNTER — Other Ambulatory Visit: Payer: Self-pay | Admitting: Family Medicine

## 2014-12-26 MED ORDER — GLIPIZIDE ER 5 MG PO TB24: 5.0000 mg | ORAL_TABLET | Freq: Every day | ORAL | Status: DC

## 2014-12-26 NOTE — Telephone Encounter (Signed)
Patient aware and med sent  

## 2014-12-26 NOTE — Telephone Encounter (Signed)
error 

## 2014-12-26 NOTE — Telephone Encounter (Signed)
Glipizide ER  5 mg once daily added, pls do f/u call next week with her , thanks, she is to continue insulin at current dose

## 2014-12-30 LAB — HM DIABETES EYE EXAM

## 2014-12-31 ENCOUNTER — Ambulatory Visit (HOSPITAL_COMMUNITY)
Admission: RE | Admit: 2014-12-31 | Discharge: 2014-12-31 | Disposition: A | Discharge status: Home / Self Care | Payer: Commercial Managed Care - HMO | Source: Ambulatory Visit | Attending: Thoracic Surgery (Cardiothoracic Vascular Surgery) | Admitting: Thoracic Surgery (Cardiothoracic Vascular Surgery)

## 2014-12-31 DIAGNOSIS — J9809 Other diseases of bronchus, not elsewhere classified: Secondary | ICD-10-CM | POA: Insufficient documentation

## 2014-12-31 DIAGNOSIS — J189 Pneumonia, unspecified organism: Secondary | ICD-10-CM | POA: Diagnosis not present

## 2014-12-31 DIAGNOSIS — J9819 Other pulmonary collapse: Secondary | ICD-10-CM

## 2015-01-05 NOTE — Pre-Procedure Instructions (Signed)
[name] [name]  01/05/2015      [facility] - [place], [address] [place] [address] Phone: [phone] Fax: [phone]  [facility] [place], [place] [id number] [place] [address] Phone: [phone] Fax: [phone]    Your procedure is scheduled on Wednesday January 14, 2015 at 1:00 PM.  Report to [facility] Admitting at 11:00 A.M.  Call this number if you have problems the morning of surgery: [phone]    Remember:  Do not eat food or drink liquids after midnight.  Take these medicines the morning of surgery with A SIP OF WATER: Acetaminophen (Tylenol) if needed, Carvedilol (Coreg)   Do NOT take any diabetic pills the morning of your surgery (Ex. Glipizide/Glucotrol)   The morning of your surgery IF your blood sugar is greater than 220 take 6 units of Humalog. IF NOT greater than 220 do NOT take any insulin   Do not wear jewelry, make-up or nail polish.  Do not wear lotions, powders, or perfumes.  You may NOT wear deodorant.  Do not shave 48 hours prior to surgery.    Do not bring valuables to the hospital.   [facility] is not responsible for any belongings or valuables.  Contacts, dentures or bridgework may not be worn into surgery.  Leave your suitcase in the car.  After surgery it may be brought to your room.  For patients admitted to the hospital, discharge time will be determined by your treatment team.  Patients discharged the day of surgery will not be allowed to drive home.   Name and phone number of your driver:    Special instructions:  Shower using CHG soap the night before and the morning of your surgery  Please read over the following fact sheets that you were given. Pain Booklet, Coughing and Deep Breathing and Surgical Site Infection Prevention

## 2015-01-06 ENCOUNTER — Inpatient Hospital Stay (HOSPITAL_COMMUNITY)
Admission: RE | Admit: 2015-01-06 | Discharge: 2015-01-06 | Disposition: A | Discharge status: Home / Self Care | Payer: Self-pay | Source: Ambulatory Visit

## 2015-01-06 ENCOUNTER — Encounter (HOSPITAL_COMMUNITY): Payer: Self-pay

## 2015-01-06 NOTE — Progress Notes (Signed)
Nurse called patient to see if she was coming to scheduled 1000 PAT appointment. Patient seemed very upset and stated "I thought my appointment was tomorrow at 1000.Marland KitchenMarland KitchenMarland KitchenMarland Kitchenmy husband recently passed away and I have been pulled in every direction.Marland KitchenMarland KitchenI'm so sorry......" Nurse went ahead and did PAT appointment over the phone. Patient denied having any acute cardiac or pulmonary issues. Patient stated she last used Albuterol inhaler two or three weeks ago. Nurse went over DOS instructions thoroughly with patient. Nurse instructed patient to not take bedtime dose of Humalog instead to take it with dinner meal (patient informed Nurse that she takes Humalog insulin anytime between 1700 and 2100 daily), not to take Glipizide the morning of surgery, and to take only six units of Humalog insulin (as patient normally takes twelve) the morning of surgery IF glucose was greater than 220. If glucose was not greater than 220, then NO insulin should be taken the morning of surgery. Patient stated she wrote information down, and verbalized understanding TWICE to Nurse and then stated "yeah I got it.Marland KitchenMarland KitchenI heard you.Marland KitchenMarland KitchenMarland KitchenI wrote it down."  Nurse inquired about fasting glucose and patient informed Nurse that she did not check it this morning, but it usually runs between 80s-200s.    Afterwards Nurse told patient that a secretary would call patient to come in for a lab only appointment before surgery. Patient thanked Nurse and apologized one last time for missing scheduled PAT appointment. Nurse told patient that it was OK and it would be fine. Call ended.

## 2015-01-07 ENCOUNTER — Encounter (HOSPITAL_COMMUNITY)
Admission: RE | Admit: 2015-01-07 | Discharge: 2015-01-07 | Disposition: A | Discharge status: Home / Self Care | Payer: Commercial Managed Care - HMO | Source: Ambulatory Visit | Attending: Thoracic Surgery (Cardiothoracic Vascular Surgery) | Admitting: Thoracic Surgery (Cardiothoracic Vascular Surgery)

## 2015-01-07 VITALS — BP 121/56 | HR 82 | Temp 98.7°F | Resp 20 | Ht 66.0 in | Wt 184.1 lb

## 2015-01-07 DIAGNOSIS — Z794 Long term (current) use of insulin: Secondary | ICD-10-CM | POA: Diagnosis not present

## 2015-01-07 DIAGNOSIS — I129 Hypertensive chronic kidney disease with stage 1 through stage 4 chronic kidney disease, or unspecified chronic kidney disease: Secondary | ICD-10-CM | POA: Diagnosis not present

## 2015-01-07 DIAGNOSIS — T17598A Other foreign object in bronchus causing other injury, initial encounter: Secondary | ICD-10-CM | POA: Diagnosis not present

## 2015-01-07 DIAGNOSIS — Z79899 Other long term (current) drug therapy: Secondary | ICD-10-CM | POA: Diagnosis not present

## 2015-01-07 DIAGNOSIS — F419 Anxiety disorder, unspecified: Secondary | ICD-10-CM | POA: Diagnosis not present

## 2015-01-07 DIAGNOSIS — M1990 Unspecified osteoarthritis, unspecified site: Secondary | ICD-10-CM | POA: Diagnosis not present

## 2015-01-07 DIAGNOSIS — E119 Type 2 diabetes mellitus without complications: Secondary | ICD-10-CM | POA: Diagnosis not present

## 2015-01-07 DIAGNOSIS — Z7982 Long term (current) use of aspirin: Secondary | ICD-10-CM | POA: Diagnosis not present

## 2015-01-07 DIAGNOSIS — F329 Major depressive disorder, single episode, unspecified: Secondary | ICD-10-CM | POA: Diagnosis not present

## 2015-01-07 DIAGNOSIS — I350 Nonrheumatic aortic (valve) stenosis: Secondary | ICD-10-CM | POA: Diagnosis not present

## 2015-01-07 DIAGNOSIS — E785 Hyperlipidemia, unspecified: Secondary | ICD-10-CM | POA: Diagnosis not present

## 2015-01-07 DIAGNOSIS — I447 Left bundle-branch block, unspecified: Secondary | ICD-10-CM | POA: Diagnosis not present

## 2015-01-07 DIAGNOSIS — E669 Obesity, unspecified: Secondary | ICD-10-CM | POA: Diagnosis not present

## 2015-01-07 DIAGNOSIS — T17898A Other foreign object in other parts of respiratory tract causing other injury, initial encounter: Secondary | ICD-10-CM | POA: Diagnosis present

## 2015-01-07 DIAGNOSIS — J9809 Other diseases of bronchus, not elsewhere classified: Secondary | ICD-10-CM

## 2015-01-07 DIAGNOSIS — N189 Chronic kidney disease, unspecified: Secondary | ICD-10-CM | POA: Diagnosis not present

## 2015-01-07 DIAGNOSIS — Y929 Unspecified place or not applicable: Secondary | ICD-10-CM | POA: Diagnosis not present

## 2015-01-07 DIAGNOSIS — X58XXXA Exposure to other specified factors, initial encounter: Secondary | ICD-10-CM | POA: Diagnosis not present

## 2015-01-07 DIAGNOSIS — I5022 Chronic systolic (congestive) heart failure: Secondary | ICD-10-CM | POA: Diagnosis not present

## 2015-01-07 DIAGNOSIS — I429 Cardiomyopathy, unspecified: Secondary | ICD-10-CM | POA: Diagnosis not present

## 2015-01-07 LAB — COMPREHENSIVE METABOLIC PANEL
ALT: 25 U/L (ref 14–54)
AST: 30 U/L (ref 15–41)
Albumin: 3.6 g/dL (ref 3.5–5.0)
Alkaline Phosphatase: 36 U/L — ABNORMAL LOW (ref 38–126)
Anion gap: 9 (ref 5–15)
BUN: 29 mg/dL — ABNORMAL HIGH (ref 6–20)
CO2: 24 mmol/L (ref 22–32)
Calcium: 9.8 mg/dL (ref 8.9–10.3)
Chloride: 108 mmol/L (ref 101–111)
Creatinine, Ser: 1.83 mg/dL — ABNORMAL HIGH (ref 0.44–1.00)
GFR calc Af Amer: 31 mL/min — ABNORMAL LOW (ref >60)
GFR calc non Af Amer: 27 mL/min — ABNORMAL LOW (ref >60)
Glucose, Bld: 162 mg/dL — ABNORMAL HIGH (ref 65–99)
Potassium: 4 mmol/L (ref 3.5–5.1)
Sodium: 141 mmol/L (ref 135–145)
Total Bilirubin: 0.3 mg/dL (ref 0.3–1.2)
Total Protein: 7.2 g/dL (ref 6.5–8.1)

## 2015-01-07 LAB — CBC
HCT: 34.2 % — ABNORMAL LOW (ref 36.0–46.0)
Hemoglobin: 11 g/dL — ABNORMAL LOW (ref 12.0–15.0)
MCH: 25.2 pg — ABNORMAL LOW (ref 26.0–34.0)
MCHC: 32.2 g/dL (ref 30.0–36.0)
MCV: 78.3 fL (ref 78.0–100.0)
Platelets: 180 10*3/uL (ref 150–400)
RBC: 4.37 MIL/uL (ref 3.87–5.11)
RDW: 17.4 % — ABNORMAL HIGH (ref 11.5–15.5)
WBC: 4.4 10*3/uL (ref 4.0–10.5)

## 2015-01-07 LAB — PROTIME-INR
INR: 1.22 (ref 0.00–1.49)
Prothrombin Time: 15.5 seconds — ABNORMAL HIGH (ref 11.6–15.2)

## 2015-01-07 LAB — GLUCOSE, CAPILLARY: Glucose-Capillary: 171 mg/dL — ABNORMAL HIGH (ref 65–99)

## 2015-01-07 LAB — APTT: aPTT: 30 seconds (ref 24–37)

## 2015-01-07 NOTE — Progress Notes (Signed)
Patient arrived to PAT for lab only appointment as a portion was done over the phone with patient on yesterday. PCP is Dr. Moshe Cipro. Patient denied having any acute cardiac or pulmonary issues.  Nurse inquired about fasting glucose and patient stated it was 204 this morning, and it was 171 upon arrival to PAT. Patient informed Nurse that the highest her glucose has been was in the 240s and the lowest was 85. Nurse discussed in detail one last time with patient concerning diabetes medications to take the night before and the day of surgery. Patient interrupted Nurse while going over these instructions and stated "you just told me this yesterday, I have it written down." Nurse explained to patient the importance of these instructions, and that Nurse wanted to ensure that patient had a complete understanding of what to do, and what not to do. Diabetes handouts reviewed and given to patient. Consent signed. Lab work obtained. Chest xray will be done DOS due to orders to obtain xray within 72 hours of surgery.

## 2015-01-07 NOTE — Pre-Procedure Instructions (Signed)
[name] [name]  01/07/2015      [facility] - [place], [place] [address] [place] [address] Phone: [phone] Fax: [phone]  [facility] [place], [place] [id number] [place] [address] Phone: [phone] Fax: [phone]    Your procedure is scheduled on Wednesday January 14, 2015 at 8:30.  Report to [facility] Admitting at 6:30 A.M.  Call this number if you have problems the morning of surgery: [phone]    Remember:  Do not eat food or drink liquids after midnight.  Take these medicines the morning of surgery with A SIP OF WATER: Acetaminophen (Tylenol) if needed, Carvedilol (Coreg)   Do NOT take any diabetic pills the morning of your surgery (Ex. Glipizide/Glucotrol)   The morning of your surgery IF your blood sugar is greater than 220 take 6 units of Humalog. IF NOT greater than 220 do NOT take any insulin   Do not wear jewelry, make-up or nail polish.  Do not wear lotions, powders, or perfumes.  You may NOT wear deodorant.  Do not shave 48 hours prior to surgery.    Do not bring valuables to the hospital.   [facility] is not responsible for any belongings or valuables.  Contacts, dentures or bridgework may not be worn into surgery.  Leave your suitcase in the car.  After surgery it may be brought to your room.  For patients admitted to the hospital, discharge time will be determined by your treatment team.  Patients discharged the day of surgery will not be allowed to drive home.   Name and phone number of your driver:    Special instructions:  Shower using CHG soap the night before and the morning of your surgery  Please read over the following fact sheets that you were given. Pain Booklet, Coughing and Deep Breathing and Surgical Site Infection Prevention

## 2015-01-08 NOTE — Progress Notes (Addendum)
Anesthesia chart review: Patient is a 69 year old female scheduled for flexible bronchoscopy and rigid bronchoscopy on 01/14/15 by Dr. [name]. During evaluation for severe AS, she was found to have a broncholith obstructing the right middle lobe bronchus with chronic right middle lobe collapse. She subsequently underwent flexible bronchoscopy on 06/05/2014 by Dr [name], but he was unable to extract the foreign body. The patient was referred to Dr. [name] for thoracic surgical consultation and plans for rigid bronchoscopy were made, but for personal reasons the patient has never followed through to schedule the procedure (declining husband's health who then passed away on 2014-11-15). CT surgeon Dr.Owen last saw patient on 09/15/14 and again felt she would benefit from rigid bronchoscopy for foreign body removal to decrease the risks of recurrent pneumonia in the future.  In the future he thinks she will likely benefit from elective surgical aortic valve replacement.  History includes severe AS, left BBB, chronic combined CHF, non-ischemic CM (has seen [name], but declined AICD), HTN, DM2, HLD, anxiety, depression, symptomatic PVCs, CKD stage III, non-smoker, pneumonia, teeth extractions '15. PCP is Dr. [name]. Cardiologist is Dr. Rozann Lesches, last visit 11/06/14.   Meds include Xanax, ASA, Coreg, Zetia, Tricor, Lasix, glipizide, Humalog 50/50, Niaspan, Crestor, Aldactone.  05/23/14 [facility]/LHC (Dr. [name]): Hemodynamic Findings: Ao:137/72  LV: 164/15/21 RA: 11  RV: 50/13/13 PA: 43/17 (mean 27)  PCWP: 25 Fick Cardiac Output: 5.3 L/min Fick Cardiac Index: 2.84 L/min/m2 Central Aortic Saturation: 97% Pulmonary Artery Saturation: 67% Aortic valve Data:  27 mmHg peak to peak gradient 15.8 mm HG mean gradient AVA calculated 1.4 cm2 Impression: 1. No angiographic evidence of CAD 2. Aortic stenosis, severe by dobutamine echo Recommendations: Planning  in place for aortic valve replacement. Pt will be admitted overnight for hydration post cath given renal insufficiency.   12/23/13 Stress echo: - Dobutamine echocardiogram to assess severity of AS. Note baseline LV function appeared to be severely reduced on parasternal images but no further imaging of LV function performed. Baseline images reveal peak velocity of 3.75 m/s with mean gradient of 31 mmHg and AVA of 0.4 cm2; with dobutamine at 20 ug, peak velocity increased to 4.2 m/s and mean gradient of 38 mmHg with AVA 0.4 cm2. Findings consistent with severe AS.  11/20/13 Echo: - Left ventricle: The cavity size was mildly dilated. Wall thickness was normal. The estimated ejection fraction was 15%. Diffuse hypokinesis. Doppler parameters are consistent with abnormal left ventricular relaxation (grade 1 diastolic dysfunction). - Aortic valve: Moderately to severely calcified annulus. Severely thickened leaflets. There was severe stenosis by continuity equation with moderately elevated gradient consistent with severe low flow low gradient aortic stenosis in the setting of severe LV systolic dysfunction. Mild to moderate regurgitation. Valve area: 0.67cm^2(VTI). Valve area: 0.62cm^2 (Vmax). Regurgitation pressure half-time: 441ms. - Mitral valve: Mildly to moderately calcified annulus. Mildly thickened leaflets . Mild regurgitation. - Left atrium: The atrium was moderately dilated. - Right atrium: The atrium was mildly dilated. - Pulmonary arteries: PA peak pressure: 25mm Hg (S). PASP is borderline elevated. (Prior EF was 20-25% on 11/07/11 and 02/09/12.)  05/23/14 EKG: NSR, possible LAE, left BBB.  12/31/14 Chest CT w/o contrast:  IMPRESSION: 1. Chronic atelectasis of the right middle lobe secondary to broncholith in the right middle lobe bronchus. No evidence of active infection. No significant change from prior. 2. Improvement in mild atelectasis in the lingula.  Preoperative labs noted. BUN/Cr  29/1.83 which appears within the higher end of her baseline. Glucose 162. H/H 11.0/34.2.  A1C 7.9 on 12/08/14.  She is for CXR on the day of surgery.  Patient with severe AS and non-ischemic CM with need for extraction of broncholith in the RML bronchus to decrease her risk of pneumonia, particularly with anticipated upcoming AVR. dental surgery prior to undergoing TAVR or AVR. She has refused ICD in the past.Dr. Roxy Manns and Dr. [name] are on board with plans for surgery. She will be further evaluated by her assigned anesthesiologist on the day of surgery to ensure no acute change/decompensation but if stable then would anticipate that she could proceed.   George Hugh [facility] Short Stay Center/Anesthesiology Phone [phone] 01/08/2015 9:57 AM

## 2015-01-14 ENCOUNTER — Ambulatory Visit (HOSPITAL_COMMUNITY): Payer: Commercial Managed Care - HMO

## 2015-01-14 ENCOUNTER — Ambulatory Visit (HOSPITAL_COMMUNITY): Payer: Commercial Managed Care - HMO | Admitting: Vascular Surgery

## 2015-01-14 ENCOUNTER — Ambulatory Visit (HOSPITAL_COMMUNITY)
Admission: RE | Admit: 2015-01-14 | Discharge: 2015-01-14 | Disposition: A | Discharge status: Home / Self Care | Payer: Commercial Managed Care - HMO | Source: Ambulatory Visit | Attending: Thoracic Surgery (Cardiothoracic Vascular Surgery) | Admitting: Thoracic Surgery (Cardiothoracic Vascular Surgery)

## 2015-01-14 ENCOUNTER — Encounter (HOSPITAL_COMMUNITY)
Admission: RE | Disposition: A | Discharge status: Home / Self Care | Payer: Self-pay | Source: Ambulatory Visit | Attending: Thoracic Surgery (Cardiothoracic Vascular Surgery)

## 2015-01-14 ENCOUNTER — Ambulatory Visit (HOSPITAL_COMMUNITY): Payer: Commercial Managed Care - HMO | Admitting: Certified Registered Nurse Anesthetist

## 2015-01-14 ENCOUNTER — Encounter (HOSPITAL_COMMUNITY): Payer: Self-pay

## 2015-01-14 DIAGNOSIS — F329 Major depressive disorder, single episode, unspecified: Secondary | ICD-10-CM | POA: Diagnosis not present

## 2015-01-14 DIAGNOSIS — F419 Anxiety disorder, unspecified: Secondary | ICD-10-CM | POA: Insufficient documentation

## 2015-01-14 DIAGNOSIS — T17598A Other foreign object in bronchus causing other injury, initial encounter: Secondary | ICD-10-CM | POA: Insufficient documentation

## 2015-01-14 DIAGNOSIS — I5022 Chronic systolic (congestive) heart failure: Secondary | ICD-10-CM | POA: Insufficient documentation

## 2015-01-14 DIAGNOSIS — E119 Type 2 diabetes mellitus without complications: Secondary | ICD-10-CM | POA: Insufficient documentation

## 2015-01-14 DIAGNOSIS — N189 Chronic kidney disease, unspecified: Secondary | ICD-10-CM | POA: Insufficient documentation

## 2015-01-14 DIAGNOSIS — Y929 Unspecified place or not applicable: Secondary | ICD-10-CM | POA: Insufficient documentation

## 2015-01-14 DIAGNOSIS — I429 Cardiomyopathy, unspecified: Secondary | ICD-10-CM | POA: Insufficient documentation

## 2015-01-14 DIAGNOSIS — E669 Obesity, unspecified: Secondary | ICD-10-CM | POA: Insufficient documentation

## 2015-01-14 DIAGNOSIS — I129 Hypertensive chronic kidney disease with stage 1 through stage 4 chronic kidney disease, or unspecified chronic kidney disease: Secondary | ICD-10-CM | POA: Diagnosis not present

## 2015-01-14 DIAGNOSIS — I350 Nonrheumatic aortic (valve) stenosis: Secondary | ICD-10-CM | POA: Insufficient documentation

## 2015-01-14 DIAGNOSIS — Z794 Long term (current) use of insulin: Secondary | ICD-10-CM | POA: Insufficient documentation

## 2015-01-14 DIAGNOSIS — T17898A Other foreign object in other parts of respiratory tract causing other injury, initial encounter: Secondary | ICD-10-CM | POA: Diagnosis not present

## 2015-01-14 DIAGNOSIS — M1990 Unspecified osteoarthritis, unspecified site: Secondary | ICD-10-CM | POA: Insufficient documentation

## 2015-01-14 DIAGNOSIS — I447 Left bundle-branch block, unspecified: Secondary | ICD-10-CM | POA: Insufficient documentation

## 2015-01-14 DIAGNOSIS — Z79899 Other long term (current) drug therapy: Secondary | ICD-10-CM | POA: Insufficient documentation

## 2015-01-14 DIAGNOSIS — X58XXXA Exposure to other specified factors, initial encounter: Secondary | ICD-10-CM | POA: Insufficient documentation

## 2015-01-14 DIAGNOSIS — E785 Hyperlipidemia, unspecified: Secondary | ICD-10-CM | POA: Insufficient documentation

## 2015-01-14 DIAGNOSIS — Z7982 Long term (current) use of aspirin: Secondary | ICD-10-CM | POA: Insufficient documentation

## 2015-01-14 DIAGNOSIS — J9809 Other diseases of bronchus, not elsewhere classified: Secondary | ICD-10-CM

## 2015-01-14 HISTORY — PX: FLEXIBLE BRONCHOSCOPY: SHX5094

## 2015-01-14 HISTORY — PX: RIGID BRONCHOSCOPY: SHX5069

## 2015-01-14 LAB — GLUCOSE, CAPILLARY
Glucose-Capillary: 142 mg/dL — ABNORMAL HIGH (ref 65–99)
Glucose-Capillary: 193 mg/dL — ABNORMAL HIGH (ref 65–99)

## 2015-01-14 SURGERY — BRONCHOSCOPY, FLEXIBLE
Anesthesia: General | Site: Bronchus

## 2015-01-14 MED ORDER — ONDANSETRON HCL 4 MG/2ML IJ SOLN
INTRAMUSCULAR | Status: AC
  Filled 2015-01-14: qty 2

## 2015-01-14 MED ORDER — FENTANYL CITRATE (PF) 100 MCG/2ML IJ SOLN
INTRAMUSCULAR | Status: DC | PRN
  Administered 2015-01-14: 50 ug via INTRAVENOUS

## 2015-01-14 MED ORDER — ROCURONIUM BROMIDE 50 MG/5ML IV SOLN
INTRAVENOUS | Status: AC
Start: 2015-01-14 — End: 2015-01-14
  Filled 2015-01-14: qty 1

## 2015-01-14 MED ORDER — EPINEPHRINE HCL 1 MG/ML IJ SOLN
INTRAMUSCULAR | Status: AC
  Filled 2015-01-14: qty 1

## 2015-01-14 MED ORDER — LIDOCAINE HCL (CARDIAC) 20 MG/ML IV SOLN
INTRAVENOUS | Status: AC
  Filled 2015-01-14: qty 5

## 2015-01-14 MED ORDER — HYDROMORPHONE HCL 1 MG/ML IJ SOLN: 0.5000 mg | INTRAMUSCULAR | Status: DC | PRN

## 2015-01-14 MED ORDER — LIDOCAINE HCL (CARDIAC) 20 MG/ML IV SOLN
INTRAVENOUS | Status: DC | PRN
  Administered 2015-01-14: 50 mg via INTRAVENOUS

## 2015-01-14 MED ORDER — ONDANSETRON HCL 4 MG/2ML IJ SOLN: 4.0000 mg | Freq: Once | INTRAMUSCULAR | Status: DC | PRN

## 2015-01-14 MED ORDER — PHENYLEPHRINE HCL 10 MG/ML IJ SOLN
10.0000 mg | INTRAVENOUS | Status: DC | PRN
  Administered 2015-01-14: 20 ug/min via INTRAVENOUS

## 2015-01-14 MED ORDER — LACTATED RINGERS IV SOLN
INTRAVENOUS | Status: DC | PRN
  Administered 2015-01-14 (×2): via INTRAVENOUS

## 2015-01-14 MED ORDER — PROPOFOL 10 MG/ML IV BOLUS
INTRAVENOUS | Status: AC
  Filled 2015-01-14: qty 20

## 2015-01-14 MED ORDER — PHENYLEPHRINE HCL 10 MG/ML IJ SOLN
INTRAMUSCULAR | Status: DC | PRN
  Administered 2015-01-14: 120 ug via INTRAVENOUS
  Administered 2015-01-14: 40 ug via INTRAVENOUS
  Administered 2015-01-14: 120 ug via INTRAVENOUS

## 2015-01-14 MED ORDER — PROPOFOL INFUSION 10 MG/ML OPTIME
INTRAVENOUS | Status: DC | PRN
  Administered 2015-01-14: 100 ug/kg/min via INTRAVENOUS

## 2015-01-14 MED ORDER — MIDAZOLAM HCL 2 MG/2ML IJ SOLN
INTRAMUSCULAR | Status: AC
  Filled 2015-01-14: qty 2

## 2015-01-14 MED ORDER — 0.9 % SODIUM CHLORIDE (POUR BTL) OPTIME
TOPICAL | Status: DC | PRN
  Administered 2015-01-14: 1000 mL

## 2015-01-14 MED ORDER — SUCCINYLCHOLINE CHLORIDE 20 MG/ML IJ SOLN
INTRAMUSCULAR | Status: DC | PRN
  Administered 2015-01-14: 60 mg via INTRAVENOUS
  Administered 2015-01-14: 100 mg via INTRAVENOUS

## 2015-01-14 MED ORDER — PROPOFOL 10 MG/ML IV BOLUS
INTRAVENOUS | Status: DC | PRN
  Administered 2015-01-14: 120 mg via INTRAVENOUS

## 2015-01-14 MED ORDER — EPINEPHRINE HCL 1 MG/ML IJ SOLN
INTRAMUSCULAR | Status: DC | PRN
  Administered 2015-01-14: 1 mg

## 2015-01-14 MED ORDER — FENTANYL CITRATE (PF) 250 MCG/5ML IJ SOLN
INTRAMUSCULAR | Status: AC
  Filled 2015-01-14: qty 5

## 2015-01-14 SURGICAL SUPPLY — 28 items
BASKET PULM ZERO TIP 12X120 (MISCELLANEOUS) ×2 IMPLANT
BNDG GAUZE ELAST 4 BULKY (GAUZE/BANDAGES/DRESSINGS) IMPLANT
BSKT SPEC RTRVL ZERO TP 120X12 (MISCELLANEOUS) ×1
CANISTER SUCTION 2500CC (MISCELLANEOUS) ×2 IMPLANT
CONT SPEC 4OZ CLIKSEAL STRL BL (MISCELLANEOUS) ×4 IMPLANT
COVER TABLE BACK 60X90 (DRAPES) ×2 IMPLANT
DRAPE INCISE IOBAN 66X45 STRL (DRAPES) IMPLANT
FILTER STRAW FLUID ASPIR (MISCELLANEOUS) ×2 IMPLANT
FORCEPS BIOP RJ4 1.8 (CUTTING FORCEPS) ×2 IMPLANT
GAUZE VASELINE FOILPK 1/2 X 72 (GAUZE/BANDAGES/DRESSINGS) IMPLANT
GLOVE SKINSENSE NS SZ7.0 (GLOVE) ×1
GLOVE SKINSENSE STRL SZ7.0 (GLOVE) ×1 IMPLANT
GLOVE SURG SIGNA 7.5 PF LTX (GLOVE) ×2 IMPLANT
GOWN BRE IMP SLV AUR XL STRL (GOWN DISPOSABLE) ×2 IMPLANT
GOWN STRL REUS W/ TWL LRG LVL3 (GOWN DISPOSABLE) ×1 IMPLANT
GOWN STRL REUS W/TWL LRG LVL3 (GOWN DISPOSABLE) ×2
KIT BASIN OR (CUSTOM PROCEDURE TRAY) ×2 IMPLANT
KIT ROOM TURNOVER OR (KITS) ×2 IMPLANT
NS IRRIG 1000ML POUR BTL (IV SOLUTION) ×2 IMPLANT
PAD ARMBOARD 7.5X6 YLW CONV (MISCELLANEOUS) ×4 IMPLANT
PAD EYE OVAL STERILE LF (GAUZE/BANDAGES/DRESSINGS) IMPLANT
SPONGE GAUZE 4X4 12PLY STER LF (GAUZE/BANDAGES/DRESSINGS) ×2 IMPLANT
SYR 30ML SLIP (SYRINGE) ×2 IMPLANT
SYR TOOMEY 50ML (SYRINGE) IMPLANT
SYRINGE 3CC LL L/F (MISCELLANEOUS) ×2 IMPLANT
TOWEL OR 17X26 10 PK STRL BLUE (TOWEL DISPOSABLE) ×2 IMPLANT
TUBE CONNECTING 12X1/4 (SUCTIONS) ×2 IMPLANT
WATER STERILE IRR 1000ML POUR (IV SOLUTION) IMPLANT

## 2015-01-14 NOTE — Anesthesia Preprocedure Evaluation (Signed)
Anesthesia Evaluation  Patient identified by MRN, date of birth, ID band Patient awake    Reviewed: Allergy & Precautions, NPO status , Patient's Chart, lab work & pertinent test results  Airway Mallampati: II       Dental   Pulmonary    Pulmonary exam normal       Cardiovascular hypertension, +CHF Normal cardiovascular exam+ dysrhythmias + Valvular Problems/Murmurs AS     Neuro/Psych    GI/Hepatic   Endo/Other  diabetes, Type 2, Insulin Dependent, Oral Hypoglycemic Agents  Renal/GU CRFRenal disease     Musculoskeletal  (+) Arthritis -,   Abdominal   Peds  Hematology   Anesthesia Other Findings   Reproductive/Obstetrics                             Anesthesia Physical Anesthesia Plan  ASA: IV  Anesthesia Plan: General   Post-op Pain Management:    Induction: Intravenous  Airway Management Planned: Oral ETT and Mask  Additional Equipment: Arterial line  Intra-op Plan:   Post-operative Plan: Extubation in OR  Informed Consent: I have reviewed the patients History and Physical, chart, labs and discussed the procedure including the risks, benefits and alternatives for the proposed anesthesia with the patient or authorized representative who has indicated his/her understanding and acceptance.     Plan Discussed with: CRNA, Anesthesiologist and Surgeon  Anesthesia Plan Comments:         Anesthesia Quick Evaluation

## 2015-01-14 NOTE — Brief Op Note (Addendum)
01/14/2015  9:38 AM  PATIENT:  [name] [name]  69 y.o. female  PRE-OPERATIVE DIAGNOSIS:  RML broncholith  POST-OPERATIVE DIAGNOSIS:  right middle lobe broncholith- foreign body  PROCEDURE:  Procedure(s): FLEXIBLE BRONCHOSCOPY (N/A) RIGID BRONCHOSCOPY with Removal Of Foreign Body  (N/A)  SURGEON:  Surgeon(s) and Role:    * [name], MD - Primary  PHYSICIAN ASSISTANT:   ASSISTANTS: none   ANESTHESIA:   general  EBL:  Total I/O In: 1000 [I.V.:1000] Out: 10 [Blood:10]  BLOOD ADMINISTERED:none  DRAINS: none   LOCAL MEDICATIONS USED:  NONE  SPECIMEN:  Source of Specimen:  RML broncholith/ foreign body  DISPOSITION OF SPECIMEN:  PATHOLOGY  PLAN OF CARE: Discharge to home after PACU  PATIENT DISPOSITION:  PACU - hemodynamically stable.   Delay start of Pharmacological VTE agent (>24hrs) due to surgical blood loss or risk of bleeding: not applicable  FINDINGS:- foreign body(probably a tooth fragment impacted in RML orifice, purulent secretions behind obstruction  Dictation # [id number]

## 2015-01-14 NOTE — Discharge Instructions (Signed)
Do not drive or engage in heavy physical activity for 24 hours  You may resume normal activities tomorrow  You will likely have a frequent cough for a few days, and may cough up small amounts of blood.  You may use over the counter cough medication or throat lozenges as needed  Call [phone] if you develop fever > 101, chest pain, shortness of breath or cough up more than 1/4 of a cup of blood  Follow up with Dr. Roxy Manns to discuss timing of your aortic valve procedure

## 2015-01-14 NOTE — Anesthesia Procedure Notes (Signed)
Procedure Name: Intubation Performed by: Gean Maidens Pre-anesthesia Checklist: Patient identified, Emergency Drugs available, Suction available, Timeout performed and Patient being monitored Patient Re-evaluated:Patient Re-evaluated prior to inductionOxygen Delivery Method: Circle system utilized Preoxygenation: Pre-oxygenation with 100% oxygen Intubation Type: IV induction Ventilation: Mask ventilation without difficulty Laryngoscope Size: Mac and 3 Grade View: Grade I Tube type: Oral Tube size: 9.0 mm Number of attempts: 1 Airway Equipment and Method: Stylet Placement Confirmation: ETT inserted through vocal cords under direct vision,  positive ETCO2,  CO2 detector and breath sounds checked- equal and bilateral Secured at: 21 cm Tube secured with: Tape Dental Injury: Teeth and Oropharynx as per pre-operative assessment

## 2015-01-14 NOTE — Transfer of Care (Signed)
Immediate Anesthesia Transfer of Care Note  Patient: [name] [name]  Procedure(s) Performed: Procedure(s): FLEXIBLE BRONCHOSCOPY (N/A) RIGID BRONCHOSCOPY with Removal Of Foreign Body  (N/A)  Patient Location: PACU  Anesthesia Type:General  Level of Consciousness: awake, patient cooperative and responds to stimulation  Airway & Oxygen Therapy: Patient Spontanous Breathing and Patient connected to nasal cannula oxygen  Post-op Assessment: Report given to RN and Post -op Vital signs reviewed and stable  Post vital signs: Reviewed and stable  Last Vitals:  Filed Vitals:   01/14/15 0630  BP: 129/66  Pulse: 76  Temp: 36.9 C  Resp: 20    Complications: No apparent anesthesia complications

## 2015-01-14 NOTE — Interval H&P Note (Signed)
History and Physical Interval Note:  01/14/2015 8:12 AM  [name] [name]  has presented today for surgery, with the diagnosis of RML broncholith  The various methods of treatment have been discussed with the patient and family. After consideration of risks, benefits and other options for treatment, the patient has consented to  Procedure(s): FLEXIBLE BRONCHOSCOPY (N/A) RIGID BRONCHOSCOPY (N/A) as a surgical intervention .  The patient's history has been reviewed, patient examined, no change in status, stable for surgery.  I have reviewed the patient's chart and labs.  Questions were answered to the patient's satisfaction.     [name]

## 2015-01-14 NOTE — H&P (View-Only) (Signed)
HPI:  69 year old woman returns to discuss bronchoscopy for a foreign body.  [name] [name] is a 69 year old woman with multiple medical problems including low output aortic stenosis, chronic systolic heart failure, hypertension, chronic kidney disease, type 2 diabetes, arthritis, depression, recurrent right middle lobe pneumonia, broncholith and right middle lobe bronchus.  She was being evaluated for TAVR last fall. She had a history of recurrent right middle lobe pneumonia and workup had revealed a broncholith, possibly a tooth, in the right middle lobe bronchus. Dr. [name] had done a flexible bronchoscopy was unable to remove the obstruction. Dr. Roxy Manns felt the broncholith should be removed prior to her aortic valve procedure to decrease her risk for perioperative pneumonia.  I saw her first in October. She was dealing with her husband's illness at the time and was lost to follow-up for several months. She was scheduled for bronchoscopy in February, but canceled that procedure and was lost to follow-up again. Her husband recently passed away and she now wishes to proceed. She has been very depressed over her husband's death. She says that she's not been having any significant wheezing. Think she is only used her inhaler once the past month. She denies chest pain. She does get short of breath with exertion.  Past Medical History  Diagnosis Date  . Essential hypertension, benign   . Hyperlipidemia   . Anxiety   . Aortic stenosis   . Symptomatic PVCs   . Nonischemic cardiomyopathy     No significant obstructive CAD at heart catheterization 05/2014, LVEF 20%  . CKD (chronic kidney disease) stage 3, GFR 30-59 ml/min   . Chronic systolic heart failure   . Obesity   . LBBB (left bundle branch block)   . Depression   . Constipation   . Incidental pulmonary nodule, > 61mm and < 67mm     7 x 4 mm LLL nodule  . Pneumonia 2013  . Diabetes mellitus, type 2   . Arthritis    Past Surgical History   Procedure Laterality Date  . Cesarean section  1987  . Multiple extractions with alveoloplasty N/A 02/27/2014    Procedure: Extraction of tooth #'s 2,4,5,6,7,8,9,10,11,12, 22, 23, 24, 25, 26, 28 with alveoloplasty and bilateral mandibular tori reductions;  Surgeon: [name], DDS;  Location: [facility];  Service: Oral Surgery;  Laterality: N/A;  . Tubal ligation  1987  . Video bronchoscopy Bilateral 06/05/2014    Procedure: VIDEO BRONCHOSCOPY WITHOUT FLUORO;  Surgeon: [name], MD;  Location: [facility];  Service: Cardiopulmonary;  Laterality: Bilateral;  . Left and right heart catheterization with coronary angiogram N/A 12/05/2011    Procedure: LEFT AND RIGHT HEART CATHETERIZATION WITH CORONARY ANGIOGRAM;  Surgeon: [name], MD;  Location: [facility] CATH LAB;  Service: Cardiovascular;  Laterality: N/A;  . Left and right heart catheterization with coronary angiogram N/A 05/23/2014    Procedure: LEFT AND RIGHT HEART CATHETERIZATION WITH CORONARY ANGIOGRAM;  Surgeon: [name], MD;  Location: [facility] CATH LAB;  Service: Cardiovascular;  Laterality: N/A;     Current Outpatient Prescriptions  Medication Sig Dispense Refill  . acetaminophen (TYLENOL) 500 MG tablet Take 1,000 mg by mouth every 6 (six) hours as needed for mild pain, fever or headache.     . ALPRAZolam (XANAX) 0.25 MG tablet Take 1 tablet (0.25 mg total) by mouth at bedtime as needed for anxiety. 15 tablet 0  . aspirin EC 81 MG tablet Take 1 tablet (81 mg total) by mouth daily. 150 tablet  2  . carvedilol (COREG) 25 MG tablet Take 1 tablet (25 mg total) by mouth 2 (two) times daily. 60 tablet 6  . ezetimibe (ZETIA) 10 MG tablet Take 1 tablet (10 mg total) by mouth daily. 30 tablet 4  . fenofibrate (TRICOR) 145 MG tablet TAKE ONE TABLET BY MOUTH ONCE DAILY. 30 tablet 3  . furosemide (LASIX) 40 MG tablet TAKE 1 TABLET BY MOUTH EVERY MORNING. 30 tablet 6  . glucose blood (EASYMAX TEST) test strip For three  times daily testing dx E10.65 100 each 5  . Insulin Lispro Prot & Lispro (HUMALOG 50/50 MIX) (50-50) 100 UNIT/ML Kwikpen Inject 10 Units into the skin 2 (two) times daily. (Patient taking differently: Inject 12 Units into the skin 2 (two) times daily. ) 15 mL 11  . Insulin Pen Needle (B-D ULTRAFINE III SHORT PEN) 31G X 8 MM MISC Use as needed to inject insulin 100 each 5  . loratadine (CLARITIN) 10 MG tablet Take 1 tablet (10 mg total) by mouth daily. 30 tablet 2  . Multiple Vitamin (MULITIVITAMIN WITH MINERALS) TABS Take 1 tablet by mouth every morning.    . niacin (NIASPAN) 1000 MG CR tablet TAKE (1) TABLET BY MOUTH AT BEDTIME. 30 tablet 2  . rosuvastatin (CRESTOR) 40 MG tablet Take 1 tablet (40 mg total) by mouth daily. 30 tablet 5  . spironolactone (ALDACTONE) 25 MG tablet Take 1 tablet (25 mg total) by mouth every morning. 90 tablet 3  . [DISCONTINUED] cetirizine (ZYRTEC) 10 MG tablet Take 1 tablet (10 mg total) by mouth daily. 30 tablet 3  . [DISCONTINUED] ferrous sulfate 325 (65 FE) MG EC tablet Take 1 tablet (325 mg total) by mouth 2 (two) times daily. 60 tablet 5  . [DISCONTINUED] FLUoxetine (PROZAC) 10 MG tablet Take 1 tablet (10 mg total) by mouth daily. Take one capsule by mouth once a day 30 tablet 3  . [DISCONTINUED] fluticasone (FLONASE) 50 MCG/ACT nasal spray Place 2 sprays into the nose daily. 16 g 3   No current facility-administered medications for this visit.   Family History  Problem Relation Age of Onset  . Heart disease Mother   . Heart attack Mother 9  . Diabetes Mother   . Hypertension Mother   . Colon cancer Father 86  . Breast cancer Sister   . Diabetes Brother   . Hypertension Brother   . Stroke Brother    History   Social History  . Marital Status: Married    Spouse Name: N/A  . Number of Children: 5  . Years of Education: N/A   Occupational History  . Retired    Social History Main Topics  . Smoking status: Never Smoker   . Smokeless tobacco:  Never Used  . Alcohol Use: No  . Drug Use: No  . Sexual Activity: No   Other Topics Concern  . Not on file   Social History Narrative     Physical Exam BP 120/69 mmHg  Pulse 83  Resp 20  Ht 5\' 4"  (1.626 m)  Wt 183 lb (83.008 kg)  BMI 31.40 kg/m2  SpO2 101% Obese 69 year old woman in no acute distress Depressed affect, tearful Alert and oriented 3 with no focal neurologic deficit No cervical or supraclavicular adenopathy Cardiac regular rate and rhythm with a 2/6 systolic murmur Lungs clear, no wheezing No peripheral edema  Diagnostic Tests: none  Impression: 69 year old woman with a history of a broncholith in the right middle lobe bronchus with recurrent pneumonias.  Interestingly she has not had a pneumonia over the past 8 or 9 months since I first saw her. She needs bronchoscopy for foreign body removal. We would plan to do this under general anesthesia. Plan to do flexible bronchoscopy initially, but rigid bronchoscopy will probably be necessary to remove the foreign body.  I discussed the nature of the procedure with her. Given her cardiac issues will plan to keep her overnight for observation afterwards. She will need general anesthesia. I discussed the indications, risks, benefits, and alternatives. She understands the risks include, but are not limited to death, MI, DVT, PE, bleeding, pneumothorax, inability to remove the foreign body, as well as the possibility of unforeseeable complications.  She says that she wishes to proceed.  Unfortunately has been almost a year since her last scan. I would not be comfortable taking her to the operating room without repeating her CT in the interim.  Plan: CT chest  Flexible and rigid bronchoscopy on 01/14/2015  [name], MD [facility] [phone]

## 2015-01-14 NOTE — Anesthesia Postprocedure Evaluation (Signed)
  Anesthesia Post-op Note  Patient: [name] [name]  Procedure(s) Performed: Procedure(s): FLEXIBLE BRONCHOSCOPY (N/A) RIGID BRONCHOSCOPY with Removal Of Foreign Body  (N/A)  Patient Location: PACU  Anesthesia Type:General  Level of Consciousness: awake, oriented, sedated and patient cooperative  Airway and Oxygen Therapy: Patient Spontanous Breathing  Post-op Pain: mild  Post-op Assessment: Post-op Vital signs reviewed, Patient's Cardiovascular Status Stable, Respiratory Function Stable, Patent Airway, No signs of Nausea or vomiting and Pain level controlled  Post-op Vital Signs: stable  Last Vitals:  Filed Vitals:   01/14/15 1045  BP:   Pulse: 82  Temp:   Resp: 18    Complications: No apparent anesthesia complications

## 2015-01-15 ENCOUNTER — Encounter (HOSPITAL_COMMUNITY): Payer: Self-pay | Admitting: Thoracic Surgery (Cardiothoracic Vascular Surgery)

## 2015-01-15 NOTE — Op Note (Signed)
NAMEDONTE, ENNEN               ACCOUNT NO.:  [id number]  MEDICAL RECORD NO.:  [id number]  LOCATION:  [facility]                         FACILITY:  [facility]  PHYSICIAN:  Revonda Standard. [name], M.D.DATE OF BIRTH:  1945/12/05  DATE OF PROCEDURE:  01/14/2015 DATE OF DISCHARGE:  01/14/2015                              OPERATIVE REPORT   PREOPERATIVE DIAGNOSIS:  Right middle lobe broncholith.  POSTOPERATIVE DIAGNOSIS:  Impacted foreign body, likely tooth fragment, at right middle lobe orifice.  PROCEDURE:  Flexible bronchoscopy, rigid bronchoscopy, and removal of foreign body.  SURGEON:  Revonda Standard. [name], M.D.  ASSISTANT:  None.  ANESTHESIA:  General.  FINDINGS:  Calcified foreign body obstructing the right middle lobe Orifice. Purulent secretions behind the foreign body.  Foreign body had appearance of a fragment of tooth.  CLINICAL NOTE:  Ms. Sampat is a 69 year old woman with a history of recurrent right middle lobe pneumonias.  She had been found to have a broncholith obstructing the right middle lobe bronchus.  Flexible bronchoscopy was performed several months ago, but was unsuccessful in removing the foreign body.  She was being evaluated for a TAVR and was advised to have the foreign body removed prior to the surgery to decrease her risk for perioperative pneumonia.  I saw her back in October 2015, but she was lost to follow up.  She then was scheduled for procedure in February, but canceled and again was lost to follow up. She now returns and wishes to proceed.  The indications, risks, benefits, and alternatives were discussed in detail with the patient. She understood and accepted the risks and agreed to proceed.  OPERATIVE NOTE:  Ms. Boser was brought to the operating room on January 14, 2015.  She had induction of general anesthesia and was intubated. Flexible fiberoptic bronchoscopy was performed via the endotracheal tube.  It revealed an obstructing foreign  body/broncholith in the right middle lobe orifice.  The remainder of the tracheobronchial tree was normal in appearance.  An attempt was made to remove the lesion using a large biopsy forceps and a snare, but these attempts were unsuccessful. A decision was made to proceed with rigid bronchoscopy.  The patient was extubated.  The rigid bronchoscope was advanced into the trachea and jet ventilation was performed.  The bronchoscope was advanced into the bronchus intermedius and the foreign body was clearly visible. A large grasping forceps was used to grasp the foreign body, which broke in two.  The two large fragments and several smaller fragments were removed, all fragments were retrieved.  There was some bleeding from the Mucosa. Dilute epinephrine was applied, and there was no ongoing bleeding.  The patient then was reintubated and flexible fiberoptic bronchoscopy was performed again using the 2-mm scope.  The scope was able to be advanced into the right middle lobe bronchus.  Both the medial and lateral segmental bronchi were clear.  A final inspection was made, there was no ongoing bleeding.  The bronchoscope was removed.  The patient was extubated in the operating room and taken to the postanesthetic care unit in good condition.     Revonda Standard [name], M.D.     SCH/MEDQ  D:  01/14/2015  T:  01/15/2015  Job:  [id number]

## 2015-01-21 ENCOUNTER — Other Ambulatory Visit: Payer: Self-pay

## 2015-01-21 ENCOUNTER — Other Ambulatory Visit: Payer: Self-pay | Admitting: Cardiovascular Disease

## 2015-01-21 MED ORDER — FUROSEMIDE 40 MG PO TABS: 40.0000 mg | ORAL_TABLET | Freq: Every morning | ORAL | Status: DC

## 2015-01-23 ENCOUNTER — Ambulatory Visit: Payer: Self-pay | Admitting: Cardiology

## 2015-01-26 ENCOUNTER — Other Ambulatory Visit: Payer: Self-pay | Admitting: Family Medicine

## 2015-01-26 DIAGNOSIS — Z23 Encounter for immunization: Secondary | ICD-10-CM | POA: Insufficient documentation

## 2015-01-26 NOTE — Assessment & Plan Note (Signed)
Uncontrolled, need to change toa ffordable med which is tolerated well Hyperlipidemia:Low fat diet discussed and encouraged.   Lipid Panel  Lab Results  Component Value Date   CHOL 247* 12/08/2014   HDL 74 12/08/2014   LDLCALC 163* 12/08/2014   TRIG 49 12/08/2014   CHOLHDL 3.3 12/08/2014   Updated lab needed at/ before next visit.

## 2015-01-26 NOTE — Assessment & Plan Note (Signed)
Improved, pt applauded on this, currently at goal Patient educated about the importance of limiting  Carbohydrate intake , the need to commit to daily physical activity for a minimum of 30 minutes , and to commit weight loss.  Diabetic Labs Latest Ref Rng 01/07/2015 12/08/2014 09/16/2014 09/12/2014 05/24/2014  HbA1c <5.7 % - 7.9(H) 7.4(H) - -  Microalbumin <2.0 mg/dL - 0.4 - - -  Micro/Creat Ratio 0.0 - 30.0 mg/g - 2.3 - - -  Chol 0 - 200 mg/dL - 247(H) - 345(H) -  HDL >=46 mg/dL - 74 - 62 -  Calc LDL 0 - 99 mg/dL - 163(H) - 271(H) -  Triglycerides <150 mg/dL - 49 - 59 -  Creatinine 0.44 - 1.00 mg/dL 1.83(H) 1.71(H) - 1.56(H) 1.55(H)   BP/Weight 01/14/2015 01/07/2015 12/25/2014 12/09/2014 11/06/2014 123XX123 0000000  Systolic BP 123XX123 123XX123 123456 123456 [id number] [id number] 123456  Diastolic BP 62 56 69 72 50 68 74  Wt. (Lbs) 184 184.1 183 183.12 183 178 178  BMI 29.71 29.73 31.4 31.42 31.4 29.62 29.62   Foot/eye exam completion dates Latest Ref Rng 12/30/2014 12/09/2014  Eye Exam No Retinopathy No Retinopathy -  Foot exam Order - - -  Foot Form Completion - - Done

## 2015-01-26 NOTE — Assessment & Plan Note (Signed)
Unchanged. Patient re-educated about  the importance of commitment to a  minimum of 150 minutes of exercise per week.  The importance of healthy food choices with portion control discussed. Encouraged to start a food diary, count calories and to consider  joining a support group. Sample diet sheets offered. Goals set by the patient for the next several months.   Weight /BMI 01/14/2015 01/07/2015 12/25/2014  WEIGHT 184 lb 184 lb 1.6 oz 183 lb  HEIGHT - 5\' 6"  5\' 4"   BMI 29.71 kg/m2 29.73 kg/m2 31.4 kg/m2    Current exercise per week 45 minutes.

## 2015-01-26 NOTE — Assessment & Plan Note (Signed)
Controlled, no change in medication  

## 2015-01-26 NOTE — Assessment & Plan Note (Signed)
After obtaining informed consent, the vaccine is  administered by LPN.  

## 2015-02-19 ENCOUNTER — Telehealth: Payer: Self-pay | Admitting: *Deleted

## 2015-02-19 ENCOUNTER — Other Ambulatory Visit: Payer: Self-pay

## 2015-02-19 ENCOUNTER — Other Ambulatory Visit: Payer: Self-pay | Admitting: Family Medicine

## 2015-02-19 MED ORDER — NIACIN ER (ANTIHYPERLIPIDEMIC) 1000 MG PO TBCR: EXTENDED_RELEASE_TABLET | ORAL | Status: DC

## 2015-02-19 MED ORDER — GLUCOSE BLOOD VI STRP
ORAL_STRIP | Status: DC
Start: 2015-02-19 — End: 2015-03-12

## 2015-02-19 NOTE — Telephone Encounter (Signed)
Med refilled.

## 2015-02-19 NOTE — Telephone Encounter (Signed)
Pt called requesting her EZ strips to be refilled pt states she only has 3 or 4 left. Please advise

## 2015-02-23 ENCOUNTER — Ambulatory Visit
Admission: RE | Admit: 2015-02-23 | Discharge: 2015-02-23 | Disposition: A | Discharge status: Home / Self Care | Payer: Commercial Managed Care - HMO | Source: Ambulatory Visit | Attending: Thoracic Surgery (Cardiothoracic Vascular Surgery) | Admitting: Thoracic Surgery (Cardiothoracic Vascular Surgery)

## 2015-02-23 ENCOUNTER — Other Ambulatory Visit: Payer: Self-pay | Admitting: Adult Health

## 2015-02-23 ENCOUNTER — Encounter: Payer: Self-pay | Admitting: Thoracic Surgery (Cardiothoracic Vascular Surgery)

## 2015-02-23 ENCOUNTER — Other Ambulatory Visit: Payer: Self-pay | Admitting: *Deleted

## 2015-02-23 ENCOUNTER — Institutional Professional Consult (permissible substitution) (INDEPENDENT_AMBULATORY_CARE_PROVIDER_SITE_OTHER): Payer: Commercial Managed Care - HMO | Admitting: Thoracic Surgery (Cardiothoracic Vascular Surgery)

## 2015-02-23 VITALS — BP 121/74 | HR 69 | Resp 20 | Ht 65.0 in | Wt 184.0 lb

## 2015-02-23 DIAGNOSIS — I350 Nonrheumatic aortic (valve) stenosis: Secondary | ICD-10-CM | POA: Diagnosis not present

## 2015-02-23 DIAGNOSIS — I5042 Chronic combined systolic (congestive) and diastolic (congestive) heart failure: Secondary | ICD-10-CM

## 2015-02-23 DIAGNOSIS — I429 Cardiomyopathy, unspecified: Secondary | ICD-10-CM | POA: Diagnosis not present

## 2015-02-23 DIAGNOSIS — I428 Other cardiomyopathies: Secondary | ICD-10-CM

## 2015-02-23 NOTE — Progress Notes (Signed)
[place] [address] [address]       , [id number]             [phone]     CARDIOTHORACIC SURGERY OFFICE NOTE  Primary Cardiologist is [name], MD Referring Provider is Blane Ohara, MD PCP is [name], MD   HPI:  Patient returns to the office today for followup of stage D severe symptomatic low gradient aortic stenosis with underlying severe left ventricular systolic dysfunction. She was originally seen in consultation on 01/29/2014 and she was seen most recently on 09/15/2014.  Last month she underwent rigid bronchoscopy for removal of a foreign body that was obstructing the right middle lobe bronchus by Dr. [name]. She was found to have a calcified foreign body consistent with a fragment of an old tooth. The procedure was uncomplicated and she has done well ever since. She returns to the office today for follow-up. She reports no new problems or complaints. She denies any fevers chills or productive cough. She has not had any chest pain. She reports stable low level exertional shortness of breath and fatigue although this does not limit her day-to-day physical activities to any significant degree. The remainder of her review of systems is unchanged from previously.  Overall she states that she feels well with exception of previous episodes when her blood sugar is too high or too low.   Current Outpatient Prescriptions  Medication Sig Dispense Refill  . aspirin EC 81 MG tablet Take 1 tablet (81 mg total) by mouth daily. 150 tablet 2  . carvedilol (COREG) 25 MG tablet TAKE 1 TABLET BY MOUTH TWICE DAILY WITH MEALS. 60 tablet 6  . CRESTOR 40 MG tablet TAKE 1 TABLET BY MOUTH ONCE DAILY. 30 tablet 3  . fenofibrate (TRICOR) 145 MG tablet TAKE ONE TABLET BY MOUTH ONCE DAILY. 30 tablet 3  . furosemide (LASIX) 40 MG tablet Take 1 tablet (40 mg total) by mouth every morning. 30 tablet 6  . glipiZIDE (GLUCOTROL XL) 5 MG 24 hr tablet Take 1 tablet (5 mg  total) by mouth daily with breakfast. 30 tablet 3  . glucose blood (EASYMAX TEST) test strip For three times daily testing dx E10.65 100 each 5  . Insulin Lispro Prot & Lispro (HUMALOG 50/50 MIX) (50-50) 100 UNIT/ML Kwikpen Inject 10 Units into the skin 2 (two) times daily. (Patient taking differently: Inject 12 Units into the skin 2 (two) times daily. ) 15 mL 11  . Insulin Pen Needle (B-D ULTRAFINE III SHORT PEN) 31G X 8 MM MISC Use as needed to inject insulin 100 each 5  . loratadine (CLARITIN) 10 MG tablet Take 1 tablet (10 mg total) by mouth daily. (Patient not taking: Reported on 01/01/2015) 30 tablet 2  . Multiple Vitamin (MULITIVITAMIN WITH MINERALS) TABS Take 1 tablet by mouth every morning.    . niacin (NIASPAN) 1000 MG CR tablet TAKE (1) TABLET BY MOUTH AT BEDTIME. 30 tablet 3  . spironolactone (ALDACTONE) 25 MG tablet TAKE 1 TABLET BY MOUTH ONCE DAILY. 90 tablet 3  . [DISCONTINUED] cetirizine (ZYRTEC) 10 MG tablet Take 1 tablet (10 mg total) by mouth daily. 30 tablet 3  . [DISCONTINUED] ferrous sulfate 325 (65 FE) MG EC tablet Take 1 tablet (325 mg total) by mouth 2 (two) times daily. 60 tablet 5  . [DISCONTINUED] FLUoxetine (PROZAC) 10 MG tablet Take 1 tablet (10 mg total) by mouth daily. Take one capsule by mouth once a day 30  tablet 3  . [DISCONTINUED] fluticasone (FLONASE) 50 MCG/ACT nasal spray Place 2 sprays into the nose daily. 16 g 3   No current facility-administered medications for this visit.      Physical Exam:   BP 121/74 mmHg  Pulse 69  Resp 20  Ht 5\' 5"  (1.651 m)  Wt 184 lb (83.462 kg)  BMI 30.62 kg/m2  SpO2 97%  General:  Well-appearing  Chest:   clear  CV:   Regular rate and rhythm with harsh crescendo decrescendo systolic murmur heard best along the sternal border  Incisions:  n/a  Abdomen:  Soft and nontender  Extremities:  Warm and well-perfused  Diagnostic Tests:  n/a   Impression:  The patient has stage D severe symptomatic low gradient aortic  stenosis with underlying severe left ventricular systolic dysfunction. She originally presented 2 years ago with acute respiratory failure and pulmonary edema, but since then she has remained remarkably stable on chronic medical therapy. Diagnostic cardiac catheterization performed last fall was notable for the absence of significant coronary artery disease. Patient would likely benefit from elective surgical aortic valve replacement in terms of improved long-term survival and the possibility of some degree of symptomatic improvement.    Plan:  We will obtain chest x-ray today to follow-up the patient's previous chronic right middle lobe collapse. We will also obtain follow-up transthoracic echocardiogram to reassess the patient's aortic valve and underlying left ventricular dysfunction. The patient will return in 2 weeks to review the results of these tests and potentially schedule elective aortic valve replacement in the near future.  I spent in excess of 15 minutes during the conduct of this office consultation and >50% of this time involved direct face-to-face encounter with the patient for counseling and/or coordination of their care.    Valentina Gu. Roxy Manns, MD 02/23/2015 11:52 AM

## 2015-02-23 NOTE — Patient Instructions (Signed)
The patient should continue all previous medications without changes at this time

## 2015-02-25 ENCOUNTER — Ambulatory Visit (HOSPITAL_COMMUNITY)
Admission: RE | Admit: 2015-02-25 | Discharge: 2015-02-25 | Disposition: A | Discharge status: Home / Self Care | Payer: Commercial Managed Care - HMO | Source: Ambulatory Visit | Attending: Thoracic Surgery (Cardiothoracic Vascular Surgery) | Admitting: Thoracic Surgery (Cardiothoracic Vascular Surgery)

## 2015-02-25 DIAGNOSIS — I350 Nonrheumatic aortic (valve) stenosis: Secondary | ICD-10-CM | POA: Diagnosis not present

## 2015-02-25 DIAGNOSIS — I083 Combined rheumatic disorders of mitral, aortic and tricuspid valves: Secondary | ICD-10-CM | POA: Diagnosis not present

## 2015-03-04 ENCOUNTER — Encounter: Payer: Self-pay | Admitting: Cardiology

## 2015-03-04 ENCOUNTER — Telehealth: Payer: Self-pay | Admitting: *Deleted

## 2015-03-04 ENCOUNTER — Ambulatory Visit (INDEPENDENT_AMBULATORY_CARE_PROVIDER_SITE_OTHER): Payer: Commercial Managed Care - HMO | Admitting: Cardiology

## 2015-03-04 VITALS — BP 120/72 | HR 76 | Ht 65.0 in | Wt 185.0 lb

## 2015-03-04 DIAGNOSIS — I429 Cardiomyopathy, unspecified: Secondary | ICD-10-CM

## 2015-03-04 DIAGNOSIS — N183 Chronic kidney disease, stage 3 unspecified: Secondary | ICD-10-CM

## 2015-03-04 DIAGNOSIS — I350 Nonrheumatic aortic (valve) stenosis: Secondary | ICD-10-CM | POA: Diagnosis not present

## 2015-03-04 DIAGNOSIS — I428 Other cardiomyopathies: Secondary | ICD-10-CM

## 2015-03-04 NOTE — Patient Instructions (Signed)
Your physician recommends that you schedule a follow-up appointment in: [facility]  Your physician recommends that you continue on your current medications as directed. Please refer to the Current Medication list given to you today.   Thanks for choosing [facility]!!!

## 2015-03-04 NOTE — Progress Notes (Signed)
Cardiology Office Note  Date: 03/04/2015   ID: [name] [name], [name] [name] 09-Jul-1946, MRN [id number]  PCP: [name] Nakayama, MD  Primary Cardiologist: [name] Lesches, MD   Chief Complaint  Patient presents with  . Cardiomyopathy  . Aortic Stenosis    History of Present Illness: [name] [name] is a 68 y.o. female last seen in March of this year. Interval records reviewed. She was seen recently by Dr. Roxy [name] for surgical evaluation regarding severe symptomatic low gradient aortic stenosis. Recent follow-up echocardiogram and chest x-ray are noted below.  She reports stable NYHA class 2-3 dyspnea on exertion depending on level of activity. She does not push herself. No angina symptoms, palpitations or syncope. Cardiac medical regimen has been stable. No significant change in weight over the last few months.  She voiced frustration today in multiple health care visits with various providers. I encouraged her to continue on and at least solidify the plan for managing her aortic valve. She will be seeing Dr. Roxy [name] again next week.   Past Medical History  Diagnosis Date  . Essential hypertension, benign   . Hyperlipidemia   . Anxiety   . Aortic stenosis   . Symptomatic PVCs   . Nonischemic cardiomyopathy     No significant obstructive CAD at heart catheterization 05/2014, LVEF 20%  . CKD (chronic kidney disease) stage 3, GFR 30-59 ml/min   . Chronic systolic heart failure   . Obesity   . Depression   . Constipation   . Incidental pulmonary nodule, > 36mm and < 54mm     7 x 4 mm LLL nodule  . Arthritis   . LBBB (left bundle branch block)   . Pneumonia 2013  . Diabetes mellitus, type 2      Current Outpatient Prescriptions  Medication Sig Dispense Refill  . aspirin EC 81 MG tablet Take 1 tablet (81 mg total) by mouth daily. 150 tablet 2  . carvedilol (COREG) 25 MG tablet TAKE 1 TABLET BY MOUTH TWICE DAILY WITH MEALS. 60 tablet 6  . CRESTOR 40 MG tablet TAKE 1 TABLET BY MOUTH ONCE  DAILY. 30 tablet 3  . fenofibrate (TRICOR) 145 MG tablet TAKE ONE TABLET BY MOUTH ONCE DAILY. 30 tablet 3  . furosemide (LASIX) 40 MG tablet Take 1 tablet (40 mg total) by mouth every morning. 30 tablet 6  . glipiZIDE (GLUCOTROL XL) 5 MG 24 hr tablet Take 1 tablet (5 mg total) by mouth daily with breakfast. 30 tablet 3  . glucose blood (EASYMAX TEST) test strip For three times daily testing dx E10.65 100 each 5  . Insulin Lispro Prot & Lispro (HUMALOG 50/50 MIX) (50-50) 100 UNIT/ML Kwikpen Inject 10 Units into the skin 2 (two) times daily. (Patient taking differently: Inject 12 Units into the skin 2 (two) times daily. ) 15 mL 11  . Insulin Pen Needle (B-D ULTRAFINE III SHORT PEN) 31G X 8 MM MISC Use as needed to inject insulin 100 each 5  . loratadine (CLARITIN) 10 MG tablet Take 1 tablet (10 mg total) by mouth daily. 30 tablet 2  . Multiple Vitamin (MULITIVITAMIN WITH MINERALS) TABS Take 1 tablet by mouth every morning.    . niacin (NIASPAN) 1000 MG CR tablet TAKE (1) TABLET BY MOUTH AT BEDTIME. 30 tablet 3  . spironolactone (ALDACTONE) 25 MG tablet TAKE 1 TABLET BY MOUTH ONCE DAILY. 90 tablet 3  . [DISCONTINUED] cetirizine (ZYRTEC) 10 MG tablet Take 1 tablet (10 mg total) by mouth daily. [place]  tablet 3  . [DISCONTINUED] ferrous sulfate 325 (65 FE) MG EC tablet Take 1 tablet (325 mg total) by mouth 2 (two) times daily. 60 tablet 5  . [DISCONTINUED] FLUoxetine (PROZAC) 10 MG tablet Take 1 tablet (10 mg total) by mouth daily. Take one capsule by mouth once a day 30 tablet 3  . [DISCONTINUED] fluticasone (FLONASE) 50 MCG/ACT nasal spray Place 2 sprays into the nose daily. 16 g 3   No current facility-administered medications for this visit.    Allergies:  Penicillins   Social History: The patient  reports that she has never smoked. She has never used smokeless tobacco. She reports that she does not drink alcohol or use illicit drugs.   ROS:  Please see the history of present illness. Otherwise,  complete review of systems is positive for intermittent anxiety.  All other systems are reviewed and negative.   Physical Exam: VS:  BP 120/72 mmHg  Pulse 76  Ht 5\' 5"  (1.651 m)  Wt 185 lb (83.915 kg)  BMI 30.79 kg/m2  SpO2 98%, BMI Body mass index is 30.79 kg/(m^2).  Wt Readings from Last 3 Encounters:  03/04/15 185 lb (83.915 kg)  02/23/15 184 lb (83.462 kg)  01/14/15 184 lb (83.462 kg)     Overweight woman, in no distress  HEENT: Conjunctiva and lids normal, oropharynx clear.  Neck: Supple, no carotid bruits, no thyromegaly.  Lungs: Clear to auscultation, nonlabored breathing at rest.  Cardiac: Regular rate and rhythm, indistinct PMI, no S3, [id number] systolic murmur, no pericardial rub.  Abdomen: Soft, nontender, bowel sounds present, no guarding or rebound.  Extremities: Trace edema, distal pulses 2+.  Skin: Warm and dry.  Musculoskeletal: No kyphosis.  Neuropsychiatric: Alert and oriented x3, affect grossly appropriate.   ECG: ECG is not ordered today.  Recent Labwork: 09/12/2014: TSH 2.455 01/07/2015: ALT 25; AST 30; BUN 29*; Creatinine, Ser 1.83*; Hemoglobin 11.0*; Platelets 180; Potassium 4.0; Sodium 141     Component Value Date/Time   CHOL 247* 12/08/2014 1007   TRIG 49 12/08/2014 1007   HDL 74 12/08/2014 1007   CHOLHDL 3.3 12/08/2014 1007   VLDL 10 12/08/2014 1007   LDLCALC 163* 12/08/2014 1007    Other Studies Reviewed Today:  Echocardiogram 02/25/2015: Study Conclusions  - Left ventricle: The cavity size was mildly dilated. Wall thickness was normal. The estimated ejection fraction was in the range of 10% to 15%. Diffuse hypokinesis. Features are consistent with a pseudonormal left ventricular filling pattern, with concomitant abnormal relaxation and increased filling pressure (grade 2 diastolic dysfunction). - Regional wall motion abnormality: Akinesis of the basal-mid anteroseptal, basal-mid inferoseptal, and apical  septal myocardium. 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 - Aortic valve: Severely calcified annulus. Severely thickened leaflets. There is severe low flow low gradient aortic stenosis. AVA by VTI 0.91, mean gradient 26 mmHg in setting of severely decreased cardiac output. Probably trileaflet. There was mild to moderate regurgitation. Mean gradient (S): 26 mm Hg. VTI ratio of LVOT to aortic valve: 0.3. Valve area (VTI): 0.85 cm^2. Valve area (Vmax): 0.91 cm^2. Valve area (Vmean): 0.96 cm^2. Regurgitation pressure half-time: 376 ms. - Mitral valve: Mildly calcified annulus. Mildly thickened leaflets . There was mild regurgitation. The MR vena contracta is 0.3 cm. - Left atrium: The atrium was moderately dilated. - Pulmonary arteries: Systolic pressure was moderately increased. PA peak pressure: 56 mm Hg (S). - Technically difficult study.  Chest x-ray 02/23/2015: FINDINGS: Mediastinum hilar structures normal. Bibasilar subsegmental atelectasis and/or scarring noted. Cardiomegaly with normal  pulmonary vascularity. Diffuse degenerative change thoracic spine .  IMPRESSION: 1. Bibasilar subsegmental atelectasis and/or scarring.  2. Cardiomegaly, no pulmonary venous congestion.  ASSESSMENT AND PLAN:  1. Symptomatic, severe low gradient aortic stenosis as outlined above. Patient encouraged to keep follow-up visit next week with Dr. Roxy [name] to solidify plan for management. Follow-up echocardiogram reviewed.  2. Nonischemic cardiomyopathy with LVEF approximately 15%. Fortunately, she remained stable from a volume perspective. No significant change in weight.  3. CKD, stage 3, recent creatinine 1.8.  4. Essential hypertension, blood pressure control is good today.  Current medicines were reviewed at length with the patient today.  Disposition: FU with me in 3 months.   Signed, [name], MD, [facility] 03/04/2015 9:24 AM    [place] [facility]  [place]. [address], [place], [place] [address] Phone: [phone]; Fax: [phone]

## 2015-03-04 NOTE — Telephone Encounter (Signed)
Noted  

## 2015-03-04 NOTE — Telephone Encounter (Signed)
Pt has a appt with [facility] in [place] today 03/04/15

## 2015-03-10 ENCOUNTER — Ambulatory Visit (INDEPENDENT_AMBULATORY_CARE_PROVIDER_SITE_OTHER): Payer: Commercial Managed Care - HMO | Admitting: Thoracic Surgery (Cardiothoracic Vascular Surgery)

## 2015-03-10 ENCOUNTER — Encounter: Payer: Self-pay | Admitting: Thoracic Surgery (Cardiothoracic Vascular Surgery)

## 2015-03-10 ENCOUNTER — Other Ambulatory Visit: Payer: Self-pay | Admitting: *Deleted

## 2015-03-10 VITALS — BP 128/79 | HR 75 | Resp 16 | Ht 65.0 in | Wt 185.0 lb

## 2015-03-10 DIAGNOSIS — I5042 Chronic combined systolic (congestive) and diastolic (congestive) heart failure: Secondary | ICD-10-CM

## 2015-03-10 DIAGNOSIS — I428 Other cardiomyopathies: Secondary | ICD-10-CM

## 2015-03-10 DIAGNOSIS — I429 Cardiomyopathy, unspecified: Secondary | ICD-10-CM | POA: Diagnosis not present

## 2015-03-10 DIAGNOSIS — I350 Nonrheumatic aortic (valve) stenosis: Secondary | ICD-10-CM

## 2015-03-10 NOTE — Progress Notes (Signed)
Patient ID: [name] [name], female   DOB: 06/09/1946, 69 y.o.   MRN: [id number]       [address]       [place], [address]             [phone]      I was asked by Dr. Roxy Manns to render an opinion regarding [name]'s aortic stenosis.  She is a 69 year old woman who I have seen multiple times in relation to a endobronchial foreign body. She has multiple medical problems including diabetes, obesity, hypertension, hyperlipidemia, recurrent pneumonias, severe aortic stenosis, valvular cardiomyopathy, and chronic heart failure.  She recently had an echocardiogram on 02/25/2015 with the following findings: Study Conclusions  - Left ventricle: The cavity size was mildly dilated. Wall thickness was normal. The estimated ejection fraction was in the range of 10% to 15%. Diffuse hypokinesis. Features are consistent with a pseudonormal left ventricular filling pattern, with concomitant abnormal relaxation and increased filling pressure (grade 2 diastolic dysfunction). - Regional wall motion abnormality: Akinesis of the basal-mid anteroseptal, basal-mid inferoseptal, and apical septal myocardium. 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 - Aortic valve: Severely calcified annulus. Severely thickened leaflets. There is severe low flow low gradient aortic stenosis. AVA by VTI 0.91, mean gradient 26 mmHg in setting of severely decreased cardiac output. Probably trileaflet. There was mild to moderate regurgitation. Mean gradient (S): 26 mm Hg. VTI ratio of LVOT to aortic valve: 0.3. Valve area (VTI): 0.85 cm^2. Valve area (Vmax): 0.91 cm^2. Valve area (Vmean): 0.96 cm^2. Regurgitation pressure half-time: 376 ms. - Mitral valve: Mildly calcified annulus. Mildly thickened leaflets . There was mild regurgitation. The MR vena contracta is 0.3 cm. - Left atrium: The atrium was moderately dilated. - Pulmonary arteries: Systolic pressure was moderately  increased. PA peak pressure: 56 mm Hg (S). - Technically difficult study.  I personally reviewed the echocardiogram and agree with the findings as noted above.  [name] is a 68 year old woman with severe low flow, low gradient aortic stenosis with severe left ventricular dysfunction with ejection fraction of 10-15%. In my opinion she is not a candidate for traditional aortic valve replacement due to unacceptably high risk. I think TAVR be a better option for her.  Revonda Standard [name], MD [facility] [phone]

## 2015-03-10 NOTE — Patient Instructions (Signed)
The patient should continue all previous medications without changes at this time  Schedule CT angiogram, physical therapy consult and pulmonary function tests as soon as possible  You will need blood work done to check your kidney function prior to your CT angiogram - this could be done by your primary care physician  Continue taking all medications without change through the day before surgery.  Patient should have nothing to eat or drink after midnight the night before surgery.  On the morning of surgery patient should take only carvedilol (Coreg) with a sip of water.

## 2015-03-10 NOTE — Progress Notes (Signed)
HEART AND [name] [name] SURGERY CONSULTATION REPORT  Referring Provider is [name] Sark, MD PCP is [name] Nakayama, MD  Chief Complaint  Patient presents with  . Aortic Stenosis    follow up     HPI:  Patient is a 69 year old African-American female with known history of low flow low gradient severe aortic stenosis, chronic combined systolic and diastolic congestive heart failure, hypertension, hyperlipidemia, type 2 diabetes mellitus with complication, and stage III chronic kidney disease returns to the office today for follow-up of severe aortic stenosis.  She was originally seen in consultation on 01/29/2014. Following her initial consultation she was referred for dental service evaluation and underwent teeth extraction.  She underwent diagnostic cardiac catheterization by Dr. Angelena [name] on 05/23/2014 revealing normal coronary artery anatomy with no significant coronary artery disease. She had moderate pulmonary hypertension.  She was found to have a foreign body lodged in the right middle lobe bronchus with chronic right middle lobe collapse as an incidental finding on cardiac gated CT angiogram of the heart.  She was referred for pulmonary consultation and underwent flexible fiberoptic bronchoscopy by Dr. Lake [name], but he was unable to remove the foreign body.  She eventually underwent rigid bronchoscopy for removal of the foreign body by Dr. Roxan [name] on 01/14/2015.  The extended period of time that has elapsed since her original evaluation last June and has been largely because the patient spent much of her time at home with her elderly husband who eventually passed away earlier this year.  She now returns with hope to proceed with definitive management of her aortic stenosis in the near future. She has been seen recently in follow-up by Dr. Domenic [name], and repeat transthoracic echocardiogram confirms the presence of severe left  ventricular systolic dysfunction with ejection fraction estimated 10-15%. The patient has severe low flow low gradient aortic stenosis with peak velocity across the aortic valve measured 3.5 m/s corresponding to mean transvalvular gradient of 26 mmHg. Previous dobutamine stress echocardiography performed 12/23/2013 confirmed the presence of severe aortic stenosis.  The patient is recently widowed and lives with one of her granddaughters in [name].  She has remained reasonably active and functional independent for all of her life. She describes stable symptoms of exertional shortness of breath and fatigue consistent with chronic combined systolic and diastolic congestive heart failure, New York Heart Association functional class IIB-III.  Symptoms seem to wax and wane a bit in severity. The patient occasionally experiences dyspnea while trying to sleep at night. However, she denies any history of orthopnea, dizzy spells, or syncope. She has never had any chest pain or chest tightness with exertion, although she states that she occasionally gets fleeting feelings of tightness across her chest that seemed to be unrelated to activity. Her mobility remains reasonably good, although she still has degenerative arthritis that limits her walking to some degree, particularly in her left knee.  Past Medical History  Diagnosis Date  . Essential hypertension, benign   . Hyperlipidemia   . Anxiety   . Aortic stenosis   . Symptomatic PVCs   . Nonischemic cardiomyopathy     No significant obstructive CAD at heart catheterization 05/2014, LVEF 20%  . CKD (chronic kidney disease) stage 3, GFR 30-59 ml/min   . Chronic systolic heart failure   . Obesity   . Depression   . Constipation   . Incidental pulmonary nodule, > 89mm and < 41mm     7 x 4 mm  LLL nodule  . Arthritis   . LBBB (left bundle branch block)   . Pneumonia 2013  . Diabetes mellitus, type 2     Past Surgical History  Procedure Laterality Date    . Cesarean section  1987  . Multiple extractions with alveoloplasty N/A 02/27/2014    Procedure: Extraction of tooth #'s 2,4,5,6,7,8,9,10,11,12, 22, 23, 24, 25, 26, 28 with alveoloplasty and bilateral mandibular tori reductions;  Surgeon: [name] [name], DDS;  Location: [facility];  Service: Oral Surgery;  Laterality: N/A;  . Tubal ligation  1987  . Video bronchoscopy Bilateral 06/05/2014    Procedure: VIDEO BRONCHOSCOPY WITHOUT FLUORO;  Surgeon: [name] Doom, MD;  Location: [place];  Service: Cardiopulmonary;  Laterality: Bilateral;  . Left and right heart catheterization with coronary angiogram N/A 12/05/2011    Procedure: LEFT AND RIGHT HEART CATHETERIZATION WITH CORONARY ANGIOGRAM;  Surgeon: [name] Blanks, MD;  Location: [facility] CATH LAB;  Service: Cardiovascular;  Laterality: N/A;  . Left and right heart catheterization with coronary angiogram N/A 05/23/2014    Procedure: LEFT AND RIGHT HEART CATHETERIZATION WITH CORONARY ANGIOGRAM;  Surgeon: [name] Blanks, MD;  Location: [facility] CATH LAB;  Service: Cardiovascular;  Laterality: N/A;  . Cardiac catheterization    . Flexible bronchoscopy N/A 01/14/2015    Procedure: FLEXIBLE BRONCHOSCOPY;  Surgeon: [name] Nakayama, MD;  Location: [place];  Service: Thoracic;  Laterality: N/A;  . Rigid bronchoscopy N/A 01/14/2015    Procedure: RIGID BRONCHOSCOPY with Removal Of Foreign Body ;  Surgeon: [name] Nakayama, MD;  Location: [place];  Service: Thoracic;  Laterality: N/A;    Family History  Problem Relation Age of Onset  . Heart disease Mother   . Heart attack Mother 57  . Diabetes Mother   . Hypertension Mother   . Colon cancer Father 69  . Breast cancer Sister   . Diabetes Brother   . Hypertension Brother   . Stroke Brother     History   Social History  . Marital Status: Married    Spouse Name: N/A  . Number of Children: 5  . Years of Education: N/A   Occupational History  . Retired    Social History Main Topics   . Smoking status: Never Smoker   . Smokeless tobacco: Never Used  . Alcohol Use: No  . Drug Use: No  . Sexual Activity: No   Other Topics Concern  . Not on file   Social History Narrative    Current Outpatient Prescriptions  Medication Sig Dispense Refill  . aspirin EC 81 MG tablet Take 1 tablet (81 mg total) by mouth daily. 150 tablet 2  . carvedilol (COREG) 25 MG tablet TAKE 1 TABLET BY MOUTH TWICE DAILY WITH MEALS. 60 tablet 6  . CRESTOR 40 MG tablet TAKE 1 TABLET BY MOUTH ONCE DAILY. 30 tablet 3  . fenofibrate (TRICOR) 145 MG tablet TAKE ONE TABLET BY MOUTH ONCE DAILY. 30 tablet 3  . furosemide (LASIX) 40 MG tablet Take 1 tablet (40 mg total) by mouth every morning. 30 tablet 6  . glipiZIDE (GLUCOTROL XL) 5 MG 24 hr tablet Take 1 tablet (5 mg total) by mouth daily with breakfast. 30 tablet 3  . glucose blood (EASYMAX TEST) test strip For three times daily testing dx E10.65 100 each 5  . Insulin Lispro Prot & Lispro (HUMALOG 50/50 MIX) (50-50) 100 UNIT/ML Kwikpen Inject 10 Units into the skin 2 (two) times daily. (Patient taking differently: Inject 12 Units into  the skin 2 (two) times daily. ) 15 mL 11  . Insulin Pen Needle (B-D ULTRAFINE III SHORT PEN) 31G X 8 MM MISC Use as needed to inject insulin 100 each 5  . loratadine (CLARITIN) 10 MG tablet Take 1 tablet (10 mg total) by mouth daily. 30 tablet 2  . Multiple Vitamin (MULITIVITAMIN WITH MINERALS) TABS Take 1 tablet by mouth every morning.    . niacin (NIASPAN) 1000 MG CR tablet TAKE (1) TABLET BY MOUTH AT BEDTIME. 30 tablet 3  . spironolactone (ALDACTONE) 25 MG tablet TAKE 1 TABLET BY MOUTH ONCE DAILY. 90 tablet 3  . [DISCONTINUED] cetirizine (ZYRTEC) 10 MG tablet Take 1 tablet (10 mg total) by mouth daily. 30 tablet 3  . [DISCONTINUED] ferrous sulfate 325 (65 FE) MG EC tablet Take 1 tablet (325 mg total) by mouth 2 (two) times daily. 60 tablet 5  . [DISCONTINUED] FLUoxetine (PROZAC) 10 MG tablet Take 1 tablet (10 mg total) by  mouth daily. Take one capsule by mouth once a day 30 tablet 3  . [DISCONTINUED] fluticasone (FLONASE) 50 MCG/ACT nasal spray Place 2 sprays into the nose daily. 16 g 3   No current facility-administered medications for this visit.    Allergies  Allergen Reactions  . Penicillins     Family unsure of reaction, but certain mom said she was allergic    Review of Systems:  General:normal appetite, decreased energy, no weight gain, no weight loss, no fever Cardiac:no chest pain with exertion, occasional atypical chest pain at rest, + SOB with exertion, no resting SOB, no PND, no orthopnea, no palpitations, no arrhythmia, no atrial fibrillation, no LE edema, no dizzy spells, no syncope Respiratory:no shortness of breath, no home oxygen, no productive cough, occasional dry cough, no bronchitis, no wheezing, no hemoptysis, no asthma, no pain with inspiration or cough, no sleep apnea, no CPAP at night GI:no difficulty swallowing, no reflux, no frequent heartburn, no hiatal hernia, no abdominal pain, occasional constipation, no diarrhea, no hematochezia, no hematemesis, no melena GU:no dysuria, no frequency, no urinary tract infection, no hematuria, no kidney stones, + chronic kidney disease Vascular:no pain suggestive of claudication, no pain in feet, no leg cramps, + varicose veins, no DVT, no non-healing foot ulcer Neuro:no stroke, no TIA's, no seizures, no headaches, no temporary blindness one eye, no slurred speech, no peripheral neuropathy, no chronic pain, no instability of gait, no memory/cognitive dysfunction Musculoskeletal:+ arthritis particularly in left knee, no joint swelling, no myalgias, mild difficulty walking, normal  mobility  Skin:no rash, no itching, no skin infections, no pressure sores or ulcerations Psych:no anxiety, no depression, no nervousness, no unusual recent stress Eyes:no blurry vision, + floaters, no recent vision changes, + wears glasses or contacts ENT:no hearing loss, edentulous without dentures Hematologic:no easy bruising, no abnormal bleeding, no clotting disorder, no frequent epistaxis Endocrine:+ diabetes, checks CBG's at home       Physical Exam:   BP 128/79 mmHg  Pulse 75  Resp 16  Ht 5\' 5"  (1.651 m)  Wt 185 lb (83.915 kg)  BMI 30.79 kg/m2  SpO2 98%  General:  Moderately obese,  well-appearing although appears older than stated age  HEENT:  Unremarkable   Neck:   no JVD, no bruits, no adenopathy   Chest:   clear to auscultation, symmetrical breath sounds, no wheezes, no rhonchi   CV:   RRR, grade III/VI crescendo/decrescendo murmur heard best at RUSB,  no diastolic murmur  Abdomen:  soft, non-tender, no masses  Extremities:  warm, well-perfused, pulses palpable, + mild LE edema  Rectal/GU  Deferred  Neuro:   Grossly non-focal and symmetrical throughout  Skin:   Clean and dry, no rashes, no breakdown   Diagnostic Tests:  Transthoracic Echocardiography  Patient:  [name] [name], [name] [name] MR #:    [id number] Study Date: 02/25/2015 Gender:   F Age:    42 Height:   165.1 cm Weight:   83.5 kg BSA:    1.98 m^2 Pt. Status: Room:  ATTENDING  Darylene Price, M.D. ORDERING   Darylene Price, M.D. REFERRING  Darylene Price, M.D. PERFORMING  [facility], [facility] SONOGRAPHER Titus Mould, RCS  cc:  ------------------------------------------------------------------- LV EF: 10% -   15%  ------------------------------------------------------------------- Indications:   Aortic stenosis 424.1.  ------------------------------------------------------------------- Study Conclusions  - Left ventricle: The cavity size was mildly dilated. Wall thickness was normal. The estimated ejection fraction was in the range of 10% to 15%. Diffuse hypokinesis. Features are consistent with a pseudonormal left ventricular filling pattern, with concomitant abnormal relaxation and increased filling pressure (grade 2 diastolic dysfunction). - Regional wall motion abnormality: Akinesis of the basal-mid anteroseptal, basal-mid inferoseptal, and apical septal myocardium. 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 - Aortic valve: Severely calcified annulus. Severely thickened leaflets. There is severe low flow low gradient aortic stenosis. AVA by VTI 0.91, mean gradient 26 mmHg in setting of severely decreased cardiac output. Probably trileaflet. There was mild to moderate regurgitation. Mean gradient (S): 26 mm Hg. VTI ratio of LVOT to aortic valve: 0.3. Valve area (VTI): 0.85 cm^2. Valve area (Vmax): 0.91 cm^2. Valve area (Vmean): 0.96 cm^2. Regurgitation pressure half-time: 376 ms. - Mitral valve: Mildly calcified annulus. Mildly thickened leaflets . There was mild regurgitation. The MR vena contracta is 0.3 cm. - Left atrium: The atrium was moderately dilated. - Pulmonary arteries: Systolic pressure was moderately increased. PA peak pressure: 56 mm Hg (S). - Technically difficult study.  Transthoracic echocardiography. M-mode, complete 2D, spectral Doppler, and color Doppler. Birthdate: Patient birthdate: 1945/11/15. Age: Patient is 69 yr old. Sex: Gender: female. BMI: 30.6 kg/m^2. Blood pressure:   110/62 Patient status: Inpatient. Study date: Study date: 02/25/2015. Study time: 02:04 PM. Location: Echo  laboratory.  -------------------------------------------------------------------  ------------------------------------------------------------------- Left ventricle: The cavity size was mildly dilated. Wall thickness was normal. The estimated ejection fraction was in the range of 10% to 15%. Diffuse hypokinesis. Regional wall motion abnormalities: Akinesis of the basal-mid anteroseptal, basal-mid inferoseptal, and apical septal myocardium. 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 Features are consistent with a pseudonormal left ventricular filling pattern, with concomitant abnormal relaxation and increased filling pressure (grade 2 diastolic dysfunction).  ------------------------------------------------------------------- Aortic valve:  Severely calcified annulus. Severely thickened leaflets. There is severe low flow low gradient aortic stenosis. AVA by VTI 0.91, mean gradient 26 mmHg in setting of severely decreased cardiac output. Probably trileaflet. Doppler: There was mild to moderate regurgitation.  VTI ratio of LVOT to aortic valve: 0.3. Valve area (VTI): 0.85 cm^2. Indexed valve area (VTI): 0.43 cm^2/m^2. Peak velocity ratio of LVOT to aortic valve: 0.32. Valve area (Vmax): 0.91 cm^2. Indexed valve area (Vmax): 0.46 cm^2/m^2. Mean velocity ratio of LVOT to aortic valve: 0.3. Valve area (Vmean): 0.96 cm^2. Indexed valve area (Vmean): 0.48 cm^2/m^2.  Mean gradient (S): 26 mm Hg. Peak gradient (S): 48 mm Hg.  ------------------------------------------------------------------- Aorta: Aortic root: The aortic root was normal in size.  ------------------------------------------------------------------- Mitral valve:  Mildly calcified annulus. Mildly thickened leaflets . Doppler:  There was no evidence for stenosis.  There was mild  regurgitation. The MR vena contracta is 0.3 cm.  Peak gradient (D): 6 mm  Hg.  ------------------------------------------------------------------- Left atrium: The atrium was moderately dilated.  ------------------------------------------------------------------- Atrial septum: Poorly visualized.  ------------------------------------------------------------------- Right ventricle: The cavity size was normal. Systolic function was normal.  ------------------------------------------------------------------- Pulmonic valve:  Not well visualized. Doppler:  There was no evidence for stenosis.  There was no significant regurgitation.  ------------------------------------------------------------------- Tricuspid valve:  Normal thickness leaflets. Doppler:  There was no evidence for stenosis.  There was mild regurgitation.  ------------------------------------------------------------------- Pulmonary artery:  Systolic pressure was moderately increased.  ------------------------------------------------------------------- Right atrium: The atrium was normal in size.  ------------------------------------------------------------------- Pericardium: There was no pericardial effusion.  ------------------------------------------------------------------- Systemic veins: Inferior vena cava: The vessel was normal in size. The respirophasic diameter changes were in the normal range (>= 50%), consistent with normal central venous pressure.  ------------------------------------------------------------------- Measurements  Left ventricle              Value      Reference LV ID, ED, PLAX chordal     (H)   62   mm    43 - 52 LV ID, ES, PLAX chordal     (H)   52   mm    23 - 38 LV fx shortening, PLAX chordal  (L)   16   %    >=29 LV PW thickness, ED           8   mm    --------- IVS/LV PW ratio, ED           1        <=1.3 Stroke volume, 2D             77   ml    --------- Stroke volume/bsa, 2D          39   ml/m^2  ---------  Ventricular septum            Value      Reference IVS thickness, ED            8   mm    ---------  LVOT                   Value      Reference LVOT ID, S                20   mm    --------- LVOT area                3.14  cm^2   --------- LVOT peak velocity, S          112.25 cm/s   --------- LVOT mean velocity, S          71.2  cm/s   --------- LVOT VTI, S               26.45 cm    --------- LVOT peak gradient, S          5   mm Hg  --------- Stroke volume (SV), LVOT DP       75   ml    --------- Stroke index (SV/bsa), LVOT DP      37.8  ml/m^2  ---------  Aortic valve               Value      Reference Aortic valve peak velocity, S      346  cm/s   --------- Aortic valve mean velocity, S  234  cm/s   --------- Aortic valve VTI, S           88.9  cm    --------- Aortic mean gradient, S         26   mm Hg  --------- Aortic peak gradient, S         48   mm Hg  --------- VTI ratio, LVOT/AV            0.3       --------- Aortic valve area, VTI          0.85  cm^2   --------- Aortic valve area/bsa, VTI        0.43  cm^2/m^2 --------- Velocity ratio, peak, LVOT/AV      0.32      --------- Aortic valve area, peak velocity     0.91  cm^2   --------- Aortic valve area/bsa, peak       0.46  cm^2/m^2 --------- velocity Velocity ratio, mean, LVOT/AV      0.3       --------- Aortic valve area, mean velocity     0.96  cm^2   --------- Aortic valve area/bsa, mean       0.48  cm^2/m^2 --------- velocity Aortic regurg pressure  half-time     376  ms    ---------  Aorta                  Value      Reference Aortic root ID, ED            26   mm    ---------  Left atrium               Value      Reference LA ID, A-P, ES              43   mm    --------- LA volume/bsa, S             38   ml/m^2  ---------  Mitral valve               Value      Reference Mitral E-wave peak velocity       125  cm/s   --------- Mitral A-wave peak velocity       130  cm/s   --------- Mitral deceleration time     (H)   280  ms    150 - 230 Mitral peak gradient, D         6   mm Hg  --------- Mitral E/A ratio, peak          1        --------- Mitral regurg VTI, PISA         193  cm    ---------  Pulmonary arteries            Value      Reference PA pressure, S, DP        (H)   56   mm Hg  <=30  Right ventricle             Value      Reference RV ID, ED, PLAX             23.8  mm    19 - 38 TAPSE                  29.1  mm    --------- RV s&',  lateral, S            11.1  cm/s   ---------  Legend: (L) and (H) mark values outside specified reference range.  ------------------------------------------------------------------- Prepared and Electronically Authenticated by  Kerry Hough, M.D. 2016-07-13T16:12:55   Stress Echocardiography  Patient:  Samona, Crowell MR #:    [id number] Study Date: 12/23/2013 Gender:   F Age:    26 Height:   157.5cm Weight:   80.9kg BSA:    1.79m^2 Pt. Status: Room:  ATTENDING  Buel Ream, Lisabeth Devoid SONOGRAPHER [place],  Outpatient cc:  ------------------------------------------------------------  ------------------------------------------------------------ Indications:   Aortic stenosis 424.1.  ------------------------------------------------------------ History:  PMH: Non-ischemic cardiomyopathy. Aortic stenosis. Mitral valve disease. Risk factors: Hypertension. Dyslipidemia.  ------------------------------------------------------------ Study Conclusions  Stress ECG conclusions: The stress ECG was non-diagnostic due to baseline LBBB.  Impressions:  - Dobutamine echocardiogram to assess severity of AS. Note baseline LV function appeared to be severely reduced on parasternal images but no further imaging of LV function performed. Baseline images reveal peak velocity of 3.75 m/s with mean gradient of 31 mmHg and AVA of 0.4 cm2; with dobutamine at 20 ug, peak velocity increased to 4.2 m/s and mean gradient of 38 mmHg with AVA 0.4 cm2. Findings consistent with severe AS. Dobutamine. Stress echocardiography. 2D. Height: Height: 157.5cm. Height: 62in. Weight: Weight: 80.9kg. Weight: 178lb. Body mass index: BMI: 32.6kg/m^2. Body surface area:  BSA: 1.3m^2. Blood pressure:   99/51. Patient status: Outpatient.  ------------------------------------------------------------  ------------------------------------------------------------ Aortic valve:  Doppler:   VTI ratio of LVOT to aortic valve: 0.12. Indexed valve area: 0.2cm^2/m^2 (VTI). Peak velocity ratio of LVOT to aortic valve: 0.13. Valve area: 0.4cm^2 (Vmax). Indexed valve area: 0.21cm^2/m^2 (Vmax). Mean gradient: 41mm Hg (S). Peak gradient: 76mm Hg (S).  ------------------------------------------------------------ Baseline ECG:  Normal sinus rhythm with left bundle branch block.  ------------------------------------------------------------ Stress  protocol:  +-----------+--+-------+---+------------+--------+---------+ Stage   HRBP   SatRhythm   SymptomsComments         (mmHg)                   +-----------+--+-------+---+------------+--------+---------+ Baseline  73120/70 95%------------None  ---------        (87)                    +-----------+--+-------+---+------------+--------+---------+ Dobutamine 73110/76 95%----------------------------- 5 ug/kg/min (87)                    +-----------+--+-------+---+------------+--------+---------+ Dobutamine 73124/74 98%Occasional --------Patient  10      (91)    PVC's        states   ug/kg/min                  she feels                        "weird".  +-----------+--+-------+---+------------+--------+---------+ Dobutamine 75130/72 ---Ventricular ----------------- 20      (91)    couplets            ug/kg/min                        +-----------+--+-------+---+------------+--------+---------+ Immediate 67--------------------------------------- post stress                       +-----------+--+-------+---+------------+--------+---------+ Recovery; [id number] 97%----------------------------- min     (102)                   +-----------+--+-------+---+------------+--------+---------+  Recovery; 283--------------------------------------- min                           +-----------+--+-------+---+------------+--------+---------+ Recovery; 375--------------------------------------- min                           +-----------+--+-------+---+------------+--------+---------+ Recovery;  [id number] -------------------------------- min     (101)                   +-----------+--+-------+---+------------+--------+---------+ Recovery; 570-------96%----------------------------- min                           +-----------+--+-------+---+------------+--------+---------+ Recovery; 688--------------------------------------- min                           +-----------+--+-------+---+------------+--------+---------+  ------------------------------------------------------------ Stress results:  Maximal heart rate during stress was 88bpm. The maximal predicted heart rate was 152bpm.The target heart rate was not achieved. The heart rate response to stress was normal. There was a normal resting blood pressure. Normal blood pressure response to dobutamine. The rate-pressure product for the peak heart rate and blood pressure was 10249mm Hg/min. The patient experienced no chest pain during stress.  ------------------------------------------------------------ Stress ECG:  Isolated ventricular ectopy. The stress ECG was non-diagnostic due to baseline LBBB.  ------------------------------------------------------------ Baseline:  Low dose: Peak stress: Recovery:  ------------------------------------------------------------  2D measurements  Normal    Doppler measurements  Norma LVOT                          l Diam, S  20 mm  ------    LVOT Area  3.14 cm^2 ------    Peak vel, 49 cm/s   -----                S                VTI, S  10. cm    -----                      5                Aortic valve                Peak vel, 389 cm/s   -----                S                Mean vel, 267 cm/s    -----                S                VTI, S  86. cm    -----                      8                Mean    34 mm Hg  -----                gradient,                S                Peak    61 mm Hg  -----  gradient,                S                VTI ratio 0.1     -----                LVOT/AV   2                Area   0.2 cm^2/m^2 -----                index                (VTI)                Peak vel 0.1     -----                ratio,   3                LVOT/AV                Area,   0.4 cm^2   -----                Vmax                Area   0.2 cm^2/m^2 -----                index    1                (Vmax)  ------------------------------------------------------------ Prepared and Electronically Authenticated by  Kirk Ruths 2015-05-11T19:09:[id number]    Cardiac Catheterization Operative Report  CASSADIE PETTEE [id number] 10/9/201511:21 AM [name] Nakayama, MD  Procedure Performed:  1. Left Heart Catheterization 2. Selective Coronary Angiography 3. Right Heart Catheterization  Operator: Lauree Chandler, MD  Indication: 69 yo female with history of severe AS, HTN, HLD, non-ischemic cardiomyopathy with plans for AVR. Here today for right and left heart cath.    Procedure Details: The risks, benefits, complications, treatment options, and expected outcomes were discussed with the patient. The patient and/or family concurred with the proposed plan, giving informed consent. The patient was brought to the  cath lab after IV hydration was begun and oral premedication was given. The patient was further sedated with Versed and Fentanyl. The right groin was prepped and draped in the usual manner. Using the modified Seldinger access technique, a 5 French sheath was placed in the right femoral artery. A 7 French sheath was placed in the right femoral vein. A balloon tipped catheter was used to perform a right heart catheterization. Standard diagnostic catheters were used to perform selective coronary angiography. A pigtail catheter was used to cross the aortic valve. LV pressures measured. No LV gram. There were no immediate complications. The patient was taken to the recovery area in stable condition.   Hemodynamic Findings: Ao: 137/72  LV: 164/15/21 RA: 11  RV: 50/13/13 PA: 43/17 (mean 27)  PCWP: 25 Fick Cardiac Output: 5.3 L/min Fick Cardiac Index: 2.84 L/min/m2 Central Aortic Saturation: 97% Pulmonary Artery Saturation: 67%  Aortic valve Data:  27 mmHg peak to peak gradient 15.8 mm HG mean gradient AVA calculated 1.4 cm2  Angiographic Findings:  Left main: No evidence of disease.   Left Anterior Descending Artery: Large caliber vessel that courses to the apex. Large caliber diagonal branch. No evidence of disease.   Circumflex Artery: Large caliber vessel with large obtuse marginal branch. No evidence of disease.  Right Coronary Artery: Moderate sized dominant vessel. No evidence of disease.   Impression: 1. No angiographic evidence of CAD 2. Aortic stenosis, severe by dobutamine echo  Recommendations: Planning in place for aortic valve replacement. Pt will be admitted overnight for hydration post cath given renal insufficiency.    Complications: None; patient tolerated the procedure well.     Cardiac TAVR CT  TECHNIQUE: The patient was scanned on a Philips 256 scanner. A 120 kV retrospective scan was triggered in the descending  thoracic aorta at 111 HU's. Gantry rotation speed was 270 msecs and collimation was .9 mm. 2.5 mg of iv Metoprolol and no nitro were given. The 3D data set was reconstructed in 5% intervals of the R-R cycle. Systolic and diastolic phases were analyzed on a dedicated work station using MPR, MIP and VRT modes. The patient received 80 cc of contrast.  FINDINGS: Aortic Valve: Trileaflet, minimally calcified with restricted leaflet opening  Aorta: Visualized only up the the proximal portion of the ascending thoracic aorta.  Sinotubular Junction: 24 x 24 mm  Ascending Thoracic Aorta: Not visualized  Aortic Arch: Not visualized  Descending Thoracic Aorta: Not visualized  Sinus of Valsalva Measurements:  Non-coronary: 27 mm  Right -coronary: 28 mm  Left -coronary: 27 mm  Coronary Artery Height above Annulus:  Left Main: 13 mm  Right Coronary: 10 mm  Virtual Basal Annulus Measurements:  Maximum/Minimum Diameter: 27 x 22 mm  Perimeter: 92 mm  Area: 455 mm2  Coronary Arteries: The study not intended for coronary evaluation and without use of NTG. There is significant motion in the RCA.  Normal origin of coronary arteries. Right dominance. Mild calcified plague in the mid RCA. Otherwise normal coronaries. Mid to distal LAD is not visualized.  Optimum Fluoroscopic Angle for Delivery: RAO 0, CRA 5.  IMPRESSION: 1. Aortic valve measurements optimal for delivery of 26 mm Edward - Sapien XT THV.  2. Sufficient annulus to coronary distance for delivery of 26 mm Edward - Sapien XT THV.  3. Optimum Fluoroscopic Angle for Delivery is RAO 0, CRA 5.  Ena Dawley   Electronically Signed  By: Ena Dawley  On: 02/10/2014 18:48      Study Result     EXAM: OVER-READ INTERPRETATION CT CHEST  The following report is an over-read performed by radiologist Dr. Rebekah [facility] on 02/10/2014.  This over-read does not include interpretation of cardiac or coronary anatomy or pathology. The coronary calcium score/coronary CTA interpretation by the cardiologist is attached.  COMPARISON: No priors.  FINDINGS: Linear opacity in the inferior segment of the lingula is compatible with subsegmental atelectasis or scarring. 7 x 4 mm left lower lobe nodule (image 53 of series 502). Calcification in the distal bronchus intermedius which appears to extend in the proximal right middle lobe bronchus. This is associated with what appears to be chronic collapse of the right middle lobe where there is some mild cylindrical bronchiectasis. No pleural effusions. Small calcifications in the liver are compatible with calcified granulomas. There are no aggressive appearing lytic or blastic lesions noted in the visualized portions of the skeleton.  IMPRESSION: 1. 7 x 4 mm left lower lobe pulmonary nodule. If the patient is at high risk for bronchogenic carcinoma, follow-up chest CT at 3-27months is recommended. If the patient is at low risk for bronchogenic carcinoma, follow-up chest CT at 6-12 months is recommended. This recommendation follows the consensus statement: Guidelines for Management of Small Pulmonary Nodules Detected on CT Scans: A  Statement from the [employer] as published in Radiology 2005; 237:395-400. 2. There appears to be a calcified broncholith within the distal bronchus intermedius and proximal right middle lobe bronchus which is presumably obstructive, as there is what appears to be chronic collapse of the right middle lobe. Nonemergent bronchoscopic correlation may be warranted if clinically indicated.  Electronically Signed: By: Vinnie Langton M.D. On: 02/10/2014 15:38      STS Risk Calculator  Procedure    AVR  Risk of Mortality   7.8% Morbidity or Mortality  43.4% Prolonged LOS   27.9% Short LOS    8.0% Permanent Stroke   2.5% Prolonged  Vent Support  32.3% DSW Infection    1.1% Renal Failure    15.1% Reoperation    15.7%    Impression:  Patient has stage D severe low flow low gradient aortic stenosis.  She originally presented more than 2 years ago with decompensated acute exacerbation of chronic combined systolic and diastolic congestive heart failure. More recently she has been quite stable on medical therapy, although she experiences chronic symptoms of exertional shortness of breath and fatigue consistent with chronic combined systolic and diastolic congestive heart failure, New York Heart Association class IIB-III.  Recent follow-up echocardiogram demonstrates further deterioration in the patient's left ventricular systolic function with ejection fraction estimated 10-15%. Previous diagnostic cardiac catheterization is notable for the absence of significant coronary artery disease. Risks associated with conventional surgical aortic valve replacement would clearly be high because of the patient's profound left ventricular systolic dysfunction and numerous comorbid medical problems including stage III chronic kidney disease. Transcatheter aortic valve replacement might be a reasonable alternative to high risk conventional surgery. Cardiac gated CT angiogram of the heart demonstrates anatomical findings suitable for transcatheter aortic valve replacement without any complicating features. The patient will need CT angiogram of the aorta and iliac vessels to evaluate whether or not she has adequate pelvic vascular access for transfemoral approach.     Plan:  The patient and her son were counseled at length regarding treatment alternatives for management of severe symptomatic aortic stenosis. Alternative approaches such as conventional aortic valve replacement, transcatheter aortic valve replacement, and palliative medical therapy were compared and contrasted at length.  The risks associated with conventional surgical aortic valve  replacement were been discussed in detail, as were expectations for post-operative convalescence. Long-term prognosis with medical therapy was discussed. This discussion was placed in the context of the patient's own specific clinical presentation and past medical history.   The patient hopes to proceed with TAVR in the near future.  We will tentatively plan for surgery on Tuesday, 03/24/2015.  The patient will undergo CT angiogram of the aorta and iliac vessels as soon as practical.   The patient will return for follow-up on Monday 03/23/2015 to review the results of her scan and make final plans for surgery. All of her questions been addressed.     I spent in excess of 30 minutes during the conduct of this office consultation and >50% of this time involved direct face-to-face encounter with the patient for counseling and/or coordination of their care.    Valentina Gu. Roxy Manns, MD 03/10/2015 4:40 PM

## 2015-03-11 ENCOUNTER — Other Ambulatory Visit: Payer: Self-pay | Admitting: *Deleted

## 2015-03-11 DIAGNOSIS — I350 Nonrheumatic aortic (valve) stenosis: Secondary | ICD-10-CM

## 2015-03-11 DIAGNOSIS — N289 Disorder of kidney and ureter, unspecified: Secondary | ICD-10-CM

## 2015-03-12 ENCOUNTER — Ambulatory Visit (INDEPENDENT_AMBULATORY_CARE_PROVIDER_SITE_OTHER): Payer: Commercial Managed Care - HMO | Admitting: Family Medicine

## 2015-03-12 ENCOUNTER — Other Ambulatory Visit: Payer: Self-pay | Admitting: *Deleted

## 2015-03-12 ENCOUNTER — Encounter: Payer: Self-pay | Admitting: Family Medicine

## 2015-03-12 VITALS — BP 118/78 | HR 78 | Resp 18 | Ht 65.0 in | Wt 185.0 lb

## 2015-03-12 DIAGNOSIS — I350 Nonrheumatic aortic (valve) stenosis: Secondary | ICD-10-CM

## 2015-03-12 DIAGNOSIS — I5042 Chronic combined systolic (congestive) and diastolic (congestive) heart failure: Secondary | ICD-10-CM

## 2015-03-12 DIAGNOSIS — E785 Hyperlipidemia, unspecified: Secondary | ICD-10-CM

## 2015-03-12 DIAGNOSIS — I10 Essential (primary) hypertension: Secondary | ICD-10-CM | POA: Diagnosis not present

## 2015-03-12 DIAGNOSIS — E109 Type 1 diabetes mellitus without complications: Secondary | ICD-10-CM

## 2015-03-12 DIAGNOSIS — Z794 Long term (current) use of insulin: Secondary | ICD-10-CM

## 2015-03-12 DIAGNOSIS — E669 Obesity, unspecified: Secondary | ICD-10-CM

## 2015-03-12 DIAGNOSIS — F4321 Adjustment disorder with depressed mood: Secondary | ICD-10-CM

## 2015-03-12 DIAGNOSIS — IMO0001 Reserved for inherently not codable concepts without codable children: Secondary | ICD-10-CM

## 2015-03-12 DIAGNOSIS — E119 Type 2 diabetes mellitus without complications: Secondary | ICD-10-CM

## 2015-03-12 DIAGNOSIS — J9819 Other pulmonary collapse: Secondary | ICD-10-CM | POA: Diagnosis not present

## 2015-03-12 DIAGNOSIS — M25562 Pain in left knee: Secondary | ICD-10-CM

## 2015-03-12 LAB — COMPLETE METABOLIC PANEL WITH GFR
ALT: 18 U/L (ref 6–29)
AST: 22 U/L (ref 10–35)
Albumin: 4.3 g/dL (ref 3.6–5.1)
Alkaline Phosphatase: 38 U/L (ref 33–130)
BUN: 28 mg/dL — ABNORMAL HIGH (ref 7–25)
CO2: 26 mEq/L (ref 20–31)
Calcium: 10 mg/dL (ref 8.6–10.4)
Chloride: 104 mEq/L (ref 98–110)
Creat: 1.63 mg/dL — ABNORMAL HIGH (ref 0.50–0.99)
GFR, Est African American: 37 mL/min — ABNORMAL LOW (ref >=60)
GFR, Est Non African American: 32 mL/min — ABNORMAL LOW (ref >=60)
Glucose, Bld: 197 mg/dL — ABNORMAL HIGH (ref 65–99)
Potassium: 4.2 mEq/L (ref 3.5–5.3)
Sodium: 140 mEq/L (ref 135–146)
Total Bilirubin: 0.4 mg/dL (ref 0.2–1.2)
Total Protein: 7.1 g/dL (ref 6.1–8.1)

## 2015-03-12 LAB — LIPID PANEL
Cholesterol: 189 mg/dL (ref 125–200)
HDL: 83 mg/dL (ref >=46)
LDL Cholesterol: 94 mg/dL (ref <130)
Total CHOL/HDL Ratio: 2.3 Ratio (ref <=5.0)
Triglycerides: 58 mg/dL (ref <150)
VLDL: 12 mg/dL (ref <30)

## 2015-03-12 LAB — HEMOGLOBIN A1C
Hgb A1c MFr Bld: 7.9 % — ABNORMAL HIGH (ref <5.7)
Mean Plasma Glucose: 180 mg/dL — ABNORMAL HIGH (ref <117)

## 2015-03-12 NOTE — Patient Instructions (Addendum)
Annual physical exam in 4 month, call if you need me before  No changes in medication  All the best with surgery  Cholesterol and liver are excellent   I will send note to hospice on your behalf for help with grief counseling  HBA1C, chem 7 and EGFR non fast in 4 month  Thanks for choosing [facility], we consider it a privelige to serve you.   Please work on good  health habits so that your health will improve. 1. Commitment to daily physical activity for 30 to 60  minutes, if you are able to do this.  2. Commitment to wise food choices. Aim for half of your  food intake to be vegetable and fruit, one quarter starchy foods, and one quarter protein. Try to eat on a regular schedule  3 meals per day, snacking between meals should be limited to vegetables or fruits or small portions of nuts. 64 ounces of water per day is generally recommended, unless you have specific health conditions, like heart failure or kidney failure where you will need to limit fluid intake.  3. Commitment to sufficient and a  good quality of physical and mental rest daily, generally between 6 to 8 hours per day.  WITH PERSISTANCE AND PERSEVERANCE, THE IMPOSSIBLE , BECOMES THE NORM!

## 2015-03-12 NOTE — Progress Notes (Signed)
Subjective:    Patient ID: [name] [name], female    DOB: 1946-07-22, 69 y.o.   MRN: [id number]  HPI   LUETTA SUNDET     MRN: [id number]      DOB: Jan 07, 1970   HPI Ms. Salm is here for follow up and re-evaluation of chronic medical conditions, medication management and review of any available recent lab and radiology data.  Was scheduled for a physical which is overdue, however , asks that this be put off as overwhelmed with her current health issues and recovering still from loss of her spouse Preventive health is updated, specifically  Cancer screening and Immunization.   Since last visit she has had tooth removed from bronchus and is expecting her valve replacement in the next 2 months. The PT denies any adverse reactions to current medications since the last visit.  Ongoing grief and mild depression due to recent loss of spouse , would benefit and is interested in grief counseling will reach out to hospice ROS Denies recent fever or chills. Denies sinus pressure, nasal congestion, ear pain or sore throat. Denies chest congestion, productive cough or wheezing. Denies chest pains, palpitations and leg swelling Denies abdominal pain, nausea, vomiting,diarrhea or constipation.   Denies dysuria, frequency, hesitancy or incontinence.  Denies headaches, seizures, numbness, or tingling.  Denies skin break down or rash.   PE  BP 118/78 mmHg  Pulse 78  Resp 18  Ht 5\' 5"  (1.651 m)  Wt 185 lb 0.6 oz (83.934 kg)  BMI 30.79 kg/m2  SpO2 99%  Patient alert and oriented and in no cardiopulmonary distress.  HEENT: No facial asymmetry, EOMI,   oropharynx pink and moist.  Neck supple no JVD, no mass.  Chest: Clear to auscultation bilaterally.  CVS: S1, S2 systolic  murmur, no S3.Regular rate.  ABD: Soft non tender.   Ext: No edema  MS: Adequate though reduce d ROM spine, shoulders, hips and knees.  Skin: Intact, no ulcerations or rash noted.  Psych: Good eye contact,  normal affect. Memory intact not anxious mildly depressed appearing.  CNS: CN 2-12 intact, power,  normal throughout.no focal deficits noted.   Assessment & Plan   Essential hypertension, benign Controlled, no change in medication DASH diet and commitment to daily physical activity for a minimum of 30 minutes discussed and encouraged, as a part of hypertension management. The importance of attaining a healthy weight is also discussed.  BP/Weight 03/12/2015 03/10/2015 03/04/2015 02/23/2015 01/14/2015 01/07/2015 [id number]  Systolic BP 123456 0000000 123456 [id number]  Diastolic BP 78 79 72 74 62 56 69  Wt. (Lbs) 185.04 185 185 184 184 184.1 183  BMI 30.79 30.79 30.79 30.62 29.71 29.73 31.4        Aortic stenosis Anticipates valve replacement in the next 4 to 6 weeks  Chronic combined systolic and diastolic CHF (congestive heart failure) Compensated well, asymptomatic, denies PND, orthopnea or leg swelling  Right middle lobe syndrome Has had removal of piece of tooth since last visit with improvement in pulmonary staus  Diabetes mellitus, insulin dependent (IDDM), controlled Controlled, no change in medication Ms. Fedeli is reminded of the importance of commitment to daily physical activity for 30 minutes or more, as able and the need to limit carbohydrate intake to 30 to 60 grams per meal to help with blood sugar control.   The need to take medication as prescribed, test blood sugar as directed, and to call between visits if there is a concern that  blood sugar is uncontrolled is also discussed.   Ms. Munzer is reminded of the importance of daily foot exam, annual eye examination, and good blood sugar, blood pressure and cholesterol control.  Diabetic Labs Latest Ref Rng 03/11/2015 01/07/2015 12/08/2014 09/16/2014 09/12/2014  HbA1c <5.7 % 7.9(H) - 7.9(H) 7.4(H) -  Microalbumin <2.0 mg/dL - - 0.4 - -  Micro/Creat Ratio 0.0 - 30.0 mg/g - - 2.3 - -  Chol 125 - 200 mg/dL 189 - 247(H) - 345(H)   HDL >=46 mg/dL 83 - 74 - 62  Calc LDL <130 mg/dL 94 - 163(H) - 271(H)  Triglycerides <150 mg/dL 58 - 49 - 59  Creatinine 0.50 - 0.99 mg/dL 1.63(H) 1.83(H) 1.71(H) - 1.56(H)   BP/Weight 03/12/2015 03/10/2015 03/04/2015 02/23/2015 01/14/2015 01/07/2015 [id number]  Systolic BP 123456 0000000 123456 [id number]  Diastolic BP 78 79 72 74 62 56 69  Wt. (Lbs) 185.04 185 185 184 184 184.1 183  BMI 30.79 30.79 30.79 30.62 29.71 29.73 31.4   Foot/eye exam completion dates Latest Ref Rng 12/30/2014 12/09/2014  Eye Exam No Retinopathy No Retinopathy -  Foot exam Order - - -  Foot Form Completion - - Done         Hyperlipidemia LDL goal <100 Controlled, no change in medication Hyperlipidemia:Low fat diet discussed and encouraged.   Lipid Panel  Lab Results  Component Value Date   CHOL 189 03/11/2015   HDL 83 03/11/2015   LDLCALC 94 03/11/2015   TRIG 58 03/11/2015   CHOLHDL 2.3 03/11/2015        Knee pain, left Chronic osteoarthritis , however no current flare  Obesity Unchanged. Patient re-educated about  the importance of commitment to a  minimum of 150 minutes of exercise per week.  The importance of healthy food choices with portion control discussed. Encouraged to start a food diary, count calories and to consider  joining a support group. Sample diet sheets offered. Goals set by the patient for the next several months.   Weight /BMI 03/12/2015 03/10/2015 03/04/2015  WEIGHT 185 lb 0.6 oz 185 lb 185 lb  HEIGHT - 5\' 5"  5\' 5"   BMI 30.79 kg/m2 30.79 kg/m2 30.79 kg/m2    Current exercise per week 40* minutes.   Unresolved grief Would benefit from individual or group counseling, has heard from hospice and will attempt to engage them with pt, lost chronically ill spouse of over 50 years in past 3 months, grief is expected and appropriate , would benefit from support however       Review of Systems     Objective:   Physical Exam        Assessment & Plan:

## 2015-03-14 DIAGNOSIS — F4321 Adjustment disorder with depressed mood: Secondary | ICD-10-CM | POA: Insufficient documentation

## 2015-03-14 NOTE — Assessment & Plan Note (Signed)
Chronic osteoarthritis , however no current flare

## 2015-03-14 NOTE — Assessment & Plan Note (Signed)
Controlled, no change in medication [name] [name] is reminded of the importance of commitment to daily physical activity for 30 minutes or more, as able and the need to limit carbohydrate intake to 30 to 60 grams per meal to help with blood sugar control.   The need to take medication as prescribed, test blood sugar as directed, and to call between visits if there is a concern that blood sugar is uncontrolled is also discussed.   [name] [name] is reminded of the importance of daily foot exam, annual eye examination, and good blood sugar, blood pressure and cholesterol control.  Diabetic Labs Latest Ref Rng 03/11/2015 01/07/2015 12/08/2014 09/16/2014 09/12/2014  HbA1c <5.7 % 7.9(H) - 7.9(H) 7.4(H) -  Microalbumin <2.0 mg/dL - - 0.4 - -  Micro/Creat Ratio 0.0 - 30.0 mg/g - - 2.3 - -  Chol 125 - 200 mg/dL 189 - 247(H) - 345(H)  HDL >=46 mg/dL 83 - 74 - 62  Calc LDL <130 mg/dL 94 - 163(H) - 271(H)  Triglycerides <150 mg/dL 58 - 49 - 59  Creatinine 0.50 - 0.99 mg/dL 1.63(H) 1.83(H) 1.71(H) - 1.56(H)   BP/Weight 03/12/2015 03/10/2015 03/04/2015 02/23/2015 01/14/2015 01/07/2015 Q000111Q  Systolic BP 123456 0000000 123456 [id number] [id number]  Diastolic BP 78 79 72 74 62 56 69  Wt. (Lbs) 185.04 185 185 184 184 184.1 183  BMI 30.79 30.79 30.79 30.62 29.71 29.73 31.4   Foot/eye exam completion dates Latest Ref Rng 12/30/2014 12/09/2014  Eye Exam No Retinopathy No Retinopathy -  Foot exam Order - - -  Foot Form Completion - - Done

## 2015-03-14 NOTE — Assessment & Plan Note (Signed)
Anticipates valve replacement in the next 4 to 6 weeks

## 2015-03-14 NOTE — Assessment & Plan Note (Signed)
Controlled, no change in medication DASH diet and commitment to daily physical activity for a minimum of 30 minutes discussed and encouraged, as a part of hypertension management. The importance of attaining a healthy weight is also discussed.  BP/Weight 03/12/2015 03/10/2015 03/04/2015 02/23/2015 01/14/2015 01/07/2015 [id number]  Systolic BP 123456 0000000 123456 [id number]  Diastolic BP 78 79 72 74 62 56 69  Wt. (Lbs) 185.04 185 185 184 184 184.1 183  BMI 30.79 30.79 30.79 30.62 29.71 29.73 31.4

## 2015-03-14 NOTE — Assessment & Plan Note (Signed)
Has had removal of piece of tooth since last visit with improvement in pulmonary staus

## 2015-03-14 NOTE — Assessment & Plan Note (Signed)
Compensated well, asymptomatic, denies PND, orthopnea or leg swelling

## 2015-03-14 NOTE — Assessment & Plan Note (Signed)
Controlled, no change in medication Hyperlipidemia:Low fat diet discussed and encouraged.   Lipid Panel  Lab Results  Component Value Date   CHOL 189 03/11/2015   HDL 83 03/11/2015   LDLCALC 94 03/11/2015   TRIG 58 03/11/2015   CHOLHDL 2.3 03/11/2015

## 2015-03-14 NOTE — Assessment & Plan Note (Signed)
Would benefit from individual or group counseling, has heard from hospice and will attempt to engage them with pt, lost chronically ill spouse of over 50 years in past 3 months, grief is expected and appropriate , would benefit from support however

## 2015-03-14 NOTE — Assessment & Plan Note (Signed)
Unchanged. Patient re-educated about  the importance of commitment to a  minimum of 150 minutes of exercise per week.  The importance of healthy food choices with portion control discussed. Encouraged to start a food diary, count calories and to consider  joining a support group. Sample diet sheets offered. Goals set by the patient for the next several months.   Weight /BMI 03/12/2015 03/10/2015 03/04/2015  WEIGHT 185 lb 0.6 oz 185 lb 185 lb  HEIGHT - 5\' 5"  5\' 5"   BMI 30.79 kg/m2 30.79 kg/m2 30.79 kg/m2    Current exercise per week 40* minutes.

## 2015-03-17 ENCOUNTER — Ambulatory Visit (HOSPITAL_COMMUNITY)
Admission: RE | Admit: 2015-03-17 | Discharge: 2015-03-17 | Disposition: A | Discharge status: Home / Self Care | Payer: Commercial Managed Care - HMO | Source: Ambulatory Visit | Attending: Thoracic Surgery (Cardiothoracic Vascular Surgery) | Admitting: Thoracic Surgery (Cardiothoracic Vascular Surgery)

## 2015-03-17 ENCOUNTER — Encounter (HOSPITAL_COMMUNITY): Payer: Self-pay

## 2015-03-17 ENCOUNTER — Ambulatory Visit
Payer: Commercial Managed Care - HMO | Attending: Thoracic Surgery (Cardiothoracic Vascular Surgery) | Admitting: Physical Therapy

## 2015-03-17 ENCOUNTER — Encounter: Payer: Self-pay | Admitting: Physical Therapy

## 2015-03-17 VITALS — BP 121/78 | HR 63 | Temp 98.5°F | Resp 18

## 2015-03-17 DIAGNOSIS — K5790 Diverticulosis of intestine, part unspecified, without perforation or abscess without bleeding: Secondary | ICD-10-CM | POA: Insufficient documentation

## 2015-03-17 DIAGNOSIS — Z01818 Encounter for other preprocedural examination: Secondary | ICD-10-CM | POA: Diagnosis not present

## 2015-03-17 DIAGNOSIS — M25562 Pain in left knee: Secondary | ICD-10-CM | POA: Diagnosis present

## 2015-03-17 DIAGNOSIS — R911 Solitary pulmonary nodule: Secondary | ICD-10-CM | POA: Diagnosis not present

## 2015-03-17 DIAGNOSIS — N289 Disorder of kidney and ureter, unspecified: Secondary | ICD-10-CM

## 2015-03-17 DIAGNOSIS — I350 Nonrheumatic aortic (valve) stenosis: Secondary | ICD-10-CM | POA: Insufficient documentation

## 2015-03-17 DIAGNOSIS — R262 Difficulty in walking, not elsewhere classified: Secondary | ICD-10-CM | POA: Diagnosis not present

## 2015-03-17 DIAGNOSIS — I700 Atherosclerosis of aorta: Secondary | ICD-10-CM | POA: Diagnosis not present

## 2015-03-17 DIAGNOSIS — N838 Other noninflammatory disorders of ovary, fallopian tube and broad ligament: Secondary | ICD-10-CM | POA: Insufficient documentation

## 2015-03-17 DIAGNOSIS — K753 Granulomatous hepatitis, not elsewhere classified: Secondary | ICD-10-CM | POA: Insufficient documentation

## 2015-03-17 DIAGNOSIS — I517 Cardiomegaly: Secondary | ICD-10-CM | POA: Diagnosis not present

## 2015-03-17 LAB — GLUCOSE, CAPILLARY: Glucose-Capillary: 276 mg/dL — ABNORMAL HIGH (ref 65–99)

## 2015-03-17 MED ORDER — SODIUM BICARBONATE BOLUS VIA INFUSION
INTRAVENOUS | Status: AC
  Administered 2015-03-17: 250 meq via INTRAVENOUS
  Filled 2015-03-17: qty 1

## 2015-03-17 MED ORDER — IOHEXOL 350 MG/ML SOLN
80.0000 mL | Freq: Once | INTRAVENOUS | Status: AC | PRN
  Administered 2015-03-17: 80 mL via INTRAVENOUS

## 2015-03-17 MED ORDER — SODIUM BICARBONATE 8.4 % IV SOLN
INTRAVENOUS | Status: DC
  Filled 2015-03-17: qty 500

## 2015-03-17 NOTE — Progress Notes (Signed)
Pt arrived from Radiology to complete bicarb gtt .

## 2015-03-17 NOTE — Progress Notes (Signed)
PIV D/c'd. Bicarb infusion complete.  VSS.  Pt disposition home via ambulation

## 2015-03-17 NOTE — Therapy (Signed)
[place], [place], [address] Phone: [phone]   Fax:  [phone]  Physical Therapy Evaluation  Patient Details  Name: [name] [name] MRN: [id number] Date of Birth: 1946/07/23 Referring Provider:  Rexene Alberts, MD  Encounter Date: 03/17/2015      PT End of Session - 03/17/15 1455    Visit Number 1   PT Start Time 1450   PT Stop Time 1535   PT Time Calculation (min) 45 min      Past Medical History  Diagnosis Date  . Essential hypertension, benign   . Hyperlipidemia   . Anxiety   . Aortic stenosis   . Symptomatic PVCs   . Nonischemic cardiomyopathy     No significant obstructive CAD at heart catheterization 05/2014, LVEF 20%  . CKD (chronic kidney disease) stage 3, GFR 30-59 ml/min   . Chronic systolic heart failure   . Obesity   . Depression   . Constipation   . Incidental pulmonary nodule, > 87mm and < 81mm     7 x 4 mm LLL nodule  . Arthritis   . LBBB (left bundle branch block)   . Pneumonia 2013  . Diabetes mellitus, type 2   . CHF (congestive heart failure)     Past Surgical History  Procedure Laterality Date  . Cesarean section  1987  . Multiple extractions with alveoloplasty N/A 02/27/2014    Procedure: Extraction of tooth #'s 2,4,5,6,7,8,9,10,11,12, 22, 23, 24, 25, 26, 28 with alveoloplasty and bilateral mandibular tori reductions;  Surgeon: [name], DDS;  Location: [facility];  Service: Oral Surgery;  Laterality: N/A;  . Tubal ligation  1987  . Video bronchoscopy Bilateral 06/05/2014    Procedure: VIDEO BRONCHOSCOPY WITHOUT FLUORO;  Surgeon: [name], MD;  Location: [facility];  Service: Cardiopulmonary;  Laterality: Bilateral;  . Left and right heart catheterization with coronary angiogram N/A 12/05/2011    Procedure: LEFT AND RIGHT HEART CATHETERIZATION WITH CORONARY ANGIOGRAM;  Surgeon: [name], MD;  Location: [facility] CATH LAB;  Service:  Cardiovascular;  Laterality: N/A;  . Left and right heart catheterization with coronary angiogram N/A 05/23/2014    Procedure: LEFT AND RIGHT HEART CATHETERIZATION WITH CORONARY ANGIOGRAM;  Surgeon: [name], MD;  Location: [facility] CATH LAB;  Service: Cardiovascular;  Laterality: N/A;  . Cardiac catheterization    . Flexible bronchoscopy N/A 01/14/2015    Procedure: FLEXIBLE BRONCHOSCOPY;  Surgeon: [name], MD;  Location: [facility];  Service: Thoracic;  Laterality: N/A;  . Rigid bronchoscopy N/A 01/14/2015    Procedure: RIGID BRONCHOSCOPY with Removal Of Foreign Body ;  Surgeon: [name], MD;  Location: [facility];  Service: Thoracic;  Laterality: N/A;    There were no vitals filed for this visit.  Visit Diagnosis:  Difficulty walking - Plan: PT plan of care cert/re-cert  Lateral knee pain, left - Plan: PT plan of care cert/re-cert  Severe aortic stenosis - Plan: PT plan of care cert/re-cert      Subjective Assessment - 03/17/15 1455    Subjective Pt reports symptoms ongoing for more than a year however had to put her health on hold when her husband was dx with prostate cancer and subsequently passed in March 2016. Current symptoms include fatigu, mild SOB, and burning pain chest with activity which eases with rest.   Pertinent History CHF 2013 and on life support per pt report   Currently in Pain? No/denies  [facility] PT Assessment - 03/17/15 0001    Assessment   Medical Diagnosis severe aortic stenosis   Onset Date/Surgical Date 02/23/15   Precautions   Precautions None   Restrictions   Weight Bearing Restrictions No   Balance Screen   Has the patient fallen in the past 6 months No   Has the patient had a decrease in activity level because of a fear of falling?  No   Is the patient reluctant to leave their home because of a fear of falling?  No   Home Environment   Living Environment Private residence   Living Arrangements Children  grandson 70  yo lives with - pt has full guardianship   Home Access Stairs to enter   Entrance Stairs-Number of Steps 4-5   Entrance Stairs-Rails Can reach both;Right;Left   [place] One level   [place] None   Prior Function   Level of [place] with community mobility without device   ROM / Strength   AROM / PROM / Strength AROM;Strength   AROM   Overall AROM  Within functional limits for tasks performed   Overall AROM Comments shoulder ROM limited mildly due to arthritis (R>L)   Strength   Overall Strength Comments grossly 4-5/5 throughout   Strength Assessment Site Hand   Right/Left hand Right;Left   Right Hand Grip (lbs) 44   Left Hand Grip (lbs) 42   Ambulation/Gait   Gait Comments Pt ambulates without assistive device with antalgic gait due to L knee that worsens with distance/time. Pt does not demonstrate any unsteadiness despite her increased fall risk per Timed up and Go test.          [facility] Pre-Surgical Assessment - 03/17/15 0001    5 Meter Walk Test- trial 1 5 sec   5 Meter Walk Test- trial 2 5 sec.    5 Meter Walk Test- trial 3 5 sec.  </= 6 sec WNL   5 meter walk test average 5 sec   Timed Up & Go Test trial  13 sec.   Comments >12 sec indicates increased fall risk   4 Stage Balance Test tolerated for:  3 sec.   4 Stage Balance Test Position 4   comment not indicative of high fall risk   Sit To Stand Test- trial 1 16 sec.   Comment </= 11.4 WNL   ADL/IADL Independent with: Bathing;Dressing;Meal prep;Finances   ADL/IADL Needs Assistance with: Valla Leaver work   ADL/IADL Fraility Index Vulnerable   6 Minute Walk- Baseline yes   BP (mmHg) 124/78 mmHg   HR (bpm) 75   02 Sat (%RA) 99 %   Modified Borg Scale for Dyspnea 0- Nothing at all   Perceived Rate of Exertion (Borg) 6-   6 Minute Walk Post Test yes   BP (mmHg) 152/80 mmHg   HR (bpm) 126   02 Sat (%RA) 98 %   Modified Borg Scale for Dyspnea 2- Mild shortness of breath   Perceived Rate of Exertion  (Borg) 9- very light   Aerobic Endurance Distance Walked 723   Endurance additional comments Pt required deated rest break at 4:23 due to fatigue and L knee pain. Vitals above represent those at the time of rest except BP which was taken at the end of 6 minutes. Pt did not resume ambulation during the 6 minutes. HR and O2 after the 1:30 rest were 96 bpm and 99%.  PT Education - 04/16/15 1553    Education provided Yes   Education Details activity modification - rest when feel symptoms   Person(s) Educated Patient   Methods Explanation   Comprehension Verbalized understanding                    Plan - 04-16-15 1632    Clinical Impression Statement Pt is a 69 yo female presenting to OP PT for evaluation prior to TAVR surgery scheduled for 03/24/15 due to severe aortic stenosis. Pt reports symptoms of fatigue, mild SOB, and burning pain in her chest with activity that eases with rest. Symptoms have been ongoing for over a year however intervention delayed due to issues with husband's health. Pt is able to perform housework and care for her 19 y/o grandson for whom she is legal guardian but requires frequent rests and does experience symptoms previously mentioned. Pt demonstrated good strength/ROM throughout. Pt's balance testing was also good although she did score at increased fall risk per Timed Up and Go and appeared to be limited with her speed due to her L knee pain. Pt does not demonstrate any unsteadiness with gait. Pt ambulated 723' in 6 minute walk test but had to stop at 4:23 due to SOB, fatigue, and L knee pain rated 8/10. Pt's vitals did increase significantly during 6 minute walk test with HR increasing from 75 bpm at rest to 126 at [id number] and systolic BP increasing from 124/78 to 152/80 post 6 minute walk test.    PT Frequency One time visit   Consulted and Agree with Plan of Care Patient          G-Codes - Apr 16, 2015 1640    Functional  Assessment Tool Used 6 minute walk of 723', had to stop at 4:23   Functional Limitation Mobility: Walking and moving around   Mobility: Walking and Moving Around Current Status [phone]) At least 20 percent but less than 40 percent impaired, limited or restricted   Mobility: Walking and Moving Around Goal Status [phone]) At least 20 percent but less than 40 percent impaired, limited or restricted   Mobility: Walking and Moving Around Discharge Status [id number]) At least 20 percent but less than 40 percent impaired, limited or restricted       Problem List Patient Active Problem List   Diagnosis Date Noted  . Unresolved grief 03/14/2015  . Knee pain, left 12/16/2014  . Hyperlipidemia LDL goal <100 05/24/2014  . Severe aortic stenosis 05/23/2014  . Incidental pulmonary nodule, > 74mm and < 45mm 02/10/2014  . Right middle lobe syndrome 02/10/2014  . Chronic combined systolic and diastolic CHF (congestive heart failure)   . LBBB (left bundle branch block) 12/03/2013  . CKD (chronic kidney disease) stage 3, GFR 30-59 ml/min 05/21/2013  . Chronic systolic heart failure 0000000  . Seasonal allergies 02/22/2012  . Essential hypertension, benign   . Aortic stenosis   . Nonischemic cardiomyopathy 10/14/2011  . Insomnia 03/22/2011  . Diabetes mellitus, insulin dependent (IDDM), controlled 10/24/2007  . Obesity 10/24/2007    [place], PT Apr 16, 2015, 4:43 PM  [facility] [address] [place], [place], [address] Phone: [phone]   Fax:  [phone]

## 2015-03-20 ENCOUNTER — Ambulatory Visit (HOSPITAL_COMMUNITY)
Admission: RE | Admit: 2015-03-20 | Discharge: 2015-03-20 | Disposition: A | Discharge status: Home / Self Care | Payer: Commercial Managed Care - HMO | Source: Ambulatory Visit | Attending: Cardiovascular Disease | Admitting: Cardiovascular Disease

## 2015-03-20 ENCOUNTER — Encounter (HOSPITAL_COMMUNITY)
Admission: RE | Admit: 2015-03-20 | Discharge: 2015-03-20 | Disposition: A | Discharge status: Home / Self Care | Payer: Commercial Managed Care - HMO | Source: Ambulatory Visit | Attending: Cardiovascular Disease | Admitting: Cardiovascular Disease

## 2015-03-20 ENCOUNTER — Encounter (HOSPITAL_COMMUNITY): Payer: Self-pay

## 2015-03-20 ENCOUNTER — Ambulatory Visit (HOSPITAL_COMMUNITY)
Admission: RE | Admit: 2015-03-20 | Discharge: 2015-03-20 | Disposition: A | Discharge status: Home / Self Care | Payer: Commercial Managed Care - HMO | Source: Ambulatory Visit | Attending: Thoracic Surgery (Cardiothoracic Vascular Surgery) | Admitting: Thoracic Surgery (Cardiothoracic Vascular Surgery)

## 2015-03-20 VITALS — BP 122/76 | HR 79 | Temp 98.7°F | Resp 18 | Ht 63.5 in | Wt 185.6 lb

## 2015-03-20 DIAGNOSIS — I350 Nonrheumatic aortic (valve) stenosis: Secondary | ICD-10-CM

## 2015-03-20 DIAGNOSIS — E119 Type 2 diabetes mellitus without complications: Secondary | ICD-10-CM | POA: Diagnosis not present

## 2015-03-20 DIAGNOSIS — Z01812 Encounter for preprocedural laboratory examination: Secondary | ICD-10-CM | POA: Insufficient documentation

## 2015-03-20 DIAGNOSIS — Z0183 Encounter for blood typing: Secondary | ICD-10-CM | POA: Diagnosis not present

## 2015-03-20 DIAGNOSIS — I517 Cardiomegaly: Secondary | ICD-10-CM | POA: Diagnosis not present

## 2015-03-20 DIAGNOSIS — I447 Left bundle-branch block, unspecified: Secondary | ICD-10-CM | POA: Diagnosis present

## 2015-03-20 DIAGNOSIS — I509 Heart failure, unspecified: Secondary | ICD-10-CM | POA: Insufficient documentation

## 2015-03-20 DIAGNOSIS — Z01818 Encounter for other preprocedural examination: Secondary | ICD-10-CM | POA: Insufficient documentation

## 2015-03-20 LAB — COMPREHENSIVE METABOLIC PANEL
ALT: 21 U/L (ref 14–54)
AST: 30 U/L (ref 15–41)
Albumin: 4.1 g/dL (ref 3.5–5.0)
Alkaline Phosphatase: 43 U/L (ref 38–126)
Anion gap: 10 (ref 5–15)
BUN: 24 mg/dL — ABNORMAL HIGH (ref 6–20)
CO2: 21 mmol/L — ABNORMAL LOW (ref 22–32)
Calcium: 9.7 mg/dL (ref 8.9–10.3)
Chloride: 108 mmol/L (ref 101–111)
Creatinine, Ser: 1.9 mg/dL — ABNORMAL HIGH (ref 0.44–1.00)
GFR calc Af Amer: 30 mL/min — ABNORMAL LOW (ref >60)
GFR calc non Af Amer: 26 mL/min — ABNORMAL LOW (ref >60)
Glucose, Bld: 127 mg/dL — ABNORMAL HIGH (ref 65–99)
Potassium: 4.1 mmol/L (ref 3.5–5.1)
Sodium: 139 mmol/L (ref 135–145)
Total Bilirubin: 0.5 mg/dL (ref 0.3–1.2)
Total Protein: 7.3 g/dL (ref 6.5–8.1)

## 2015-03-20 LAB — GLUCOSE, CAPILLARY: Glucose-Capillary: 129 mg/dL — ABNORMAL HIGH (ref 65–99)

## 2015-03-20 LAB — URINALYSIS, ROUTINE W REFLEX MICROSCOPIC
Bilirubin Urine: NEGATIVE
Glucose, UA: NEGATIVE mg/dL
Hgb urine dipstick: NEGATIVE
Ketones, ur: NEGATIVE mg/dL
Leukocytes, UA: NEGATIVE
Nitrite: NEGATIVE
Protein, ur: NEGATIVE mg/dL
Specific Gravity, Urine: 1.009 (ref 1.005–1.030)
Urobilinogen, UA: 0.2 mg/dL (ref 0.0–1.0)
pH: 6 (ref 5.0–8.0)

## 2015-03-20 LAB — CBC
HCT: 36.4 % (ref 36.0–46.0)
Hemoglobin: 11.8 g/dL — ABNORMAL LOW (ref 12.0–15.0)
MCH: 25.9 pg — ABNORMAL LOW (ref 26.0–34.0)
MCHC: 32.4 g/dL (ref 30.0–36.0)
MCV: 80 fL (ref 78.0–100.0)
Platelets: 156 10*3/uL (ref 150–400)
RBC: 4.55 MIL/uL (ref 3.87–5.11)
RDW: 17 % — ABNORMAL HIGH (ref 11.5–15.5)
WBC: 5.4 10*3/uL (ref 4.0–10.5)

## 2015-03-20 LAB — BLOOD GAS, ARTERIAL
Acid-base deficit: 0.3 mmol/L (ref 0.0–2.0)
Bicarbonate: 23 mEq/L (ref 20.0–24.0)
Drawn by: 206361
FIO2: 0.21
O2 Saturation: 97.2 %
Patient temperature: 98.6
TCO2: 24 mmol/L (ref 0–100)
pCO2 arterial: 32.6 mmHg — ABNORMAL LOW (ref 35.0–45.0)
pH, Arterial: 7.462 — ABNORMAL HIGH (ref 7.350–7.450)
pO2, Arterial: 81.4 mmHg (ref 80.0–100.0)

## 2015-03-20 LAB — ABO/RH: ABO/RH(D): A POS

## 2015-03-20 LAB — PULMONARY FUNCTION TEST
DL/VA % pred: 99 %
DL/VA: 4.68 ml/min/mmHg/L
DLCO unc % pred: 77 %
DLCO unc: 17.66 ml/min/mmHg
FEF 25-75 Post: 2.26 L/sec
FEF 25-75 Pre: 2.38 L/sec
FEF2575-%Change-Post: -5 %
FEF2575-%Pred-Post: 138 %
FEF2575-%Pred-Pre: 146 %
FEV1-%Change-Post: 0 %
FEV1-%Pred-Post: 116 %
FEV1-%Pred-Pre: 117 %
FEV1-Post: 2.04 L
FEV1-Pre: 2.05 L
FEV1FVC-%Change-Post: 0 %
FEV1FVC-%Pred-Pre: 107 %
FEV6-%Change-Post: -1 %
FEV6-%Pred-Post: 112 %
FEV6-%Pred-Pre: 114 %
FEV6-Post: 2.44 L
FEV6-Pre: 2.48 L
FEV6FVC-%Pred-Post: 104 %
FEV6FVC-%Pred-Pre: 104 %
FVC-%Change-Post: -1 %
FVC-%Pred-Post: 107 %
FVC-%Pred-Pre: 109 %
FVC-Post: 2.44 L
FVC-Pre: 2.48 L
Post FEV1/FVC ratio: 84 %
Post FEV6/FVC ratio: 100 %
Pre FEV1/FVC ratio: 83 %
Pre FEV6/FVC Ratio: 100 %
RV % pred: 91 %
RV: 1.94 L
TLC % pred: 87 %
TLC: 4.29 L

## 2015-03-20 LAB — SURGICAL PCR SCREEN
MRSA, PCR: NEGATIVE
Staphylococcus aureus: NEGATIVE

## 2015-03-20 LAB — PROTIME-INR
INR: 1.14 (ref 0.00–1.49)
Prothrombin Time: 14.8 seconds (ref 11.6–15.2)

## 2015-03-20 LAB — APTT: aPTT: 27 seconds (ref 24–37)

## 2015-03-20 MED ORDER — ALBUTEROL SULFATE (2.5 MG/3ML) 0.083% IN NEBU
2.5000 mg | INHALATION_SOLUTION | Freq: Once | RESPIRATORY_TRACT | Status: AC
  Administered 2015-03-20: 2.5 mg via RESPIRATORY_TRACT

## 2015-03-20 MED ORDER — CHLORHEXIDINE GLUCONATE 4 % EX LIQD: 60.0000 mL | Freq: Once | CUTANEOUS | Status: DC

## 2015-03-20 NOTE — Pre-Procedure Instructions (Signed)
    [name] [name]  03/20/2015      [facility] - [place], [address] [place] [address] Phone: [phone] Fax: [phone]  [facility] [place], [place] [id number] [place] [address] Phone: [phone] Fax: [phone]    Your procedure is scheduled on 03/24/15.  Report to [facility] Admitting at 9 A.M.  Call this number if you have problems the morning of surgery:  [phone]   Remember:  Do not eat food or drink liquids after midnight.  Take these medicines the morning of surgery with A SIP OF WATER -tylenol,carvedilol,aldactone   Do not wear jewelry, make-up or nail polish.  Do not wear lotions, powders, or perfumes.  You may wear deodorant.  Do not shave 48 hours prior to surgery.  Men may shave face and neck.  Do not bring valuables to the hospital.  [facility] is not responsible for any belongings or valuables.  Contacts, dentures or bridgework may not be worn into surgery.  Leave your suitcase in the car.  After surgery it may be brought to your room.  For patients admitted to the hospital, discharge time will be determined by your treatment team.  Patients discharged the day of surgery will not be allowed to drive home.   Name and phone number of your driver:   Special instructions:    Please read over the following fact sheets that you were given. Pain Booklet, Coughing and Deep Breathing, Blood Transfusion Information, MRSA Information and Surgical Site Infection Prevention

## 2015-03-21 LAB — HEMOGLOBIN A1C
Hgb A1c MFr Bld: 8.2 % — ABNORMAL HIGH (ref 4.8–5.6)
Mean Plasma Glucose: 189 mg/dL

## 2015-03-23 ENCOUNTER — Encounter: Payer: Self-pay | Admitting: Thoracic Surgery (Cardiothoracic Vascular Surgery)

## 2015-03-23 ENCOUNTER — Ambulatory Visit (INDEPENDENT_AMBULATORY_CARE_PROVIDER_SITE_OTHER): Payer: Commercial Managed Care - HMO | Admitting: Thoracic Surgery (Cardiothoracic Vascular Surgery)

## 2015-03-23 VITALS — BP 126/79 | HR 80 | Resp 20 | Ht 63.5 in | Wt 185.0 lb

## 2015-03-23 DIAGNOSIS — I350 Nonrheumatic aortic (valve) stenosis: Secondary | ICD-10-CM | POA: Diagnosis not present

## 2015-03-23 MED ORDER — POTASSIUM CHLORIDE 2 MEQ/ML IV SOLN
80.0000 meq | INTRAVENOUS | Status: DC
  Filled 2015-03-23: qty 40

## 2015-03-23 MED ORDER — DEXTROSE 5 % IV SOLN
30.0000 ug/min | INTRAVENOUS | Status: DC
  Filled 2015-03-23: qty 2

## 2015-03-23 MED ORDER — MAGNESIUM SULFATE 50 % IJ SOLN
40.0000 meq | INTRAMUSCULAR | Status: DC
  Filled 2015-03-23: qty 10

## 2015-03-23 MED ORDER — NITROGLYCERIN IN D5W 200-5 MCG/ML-% IV SOLN
2.0000 ug/min | INTRAVENOUS | Status: DC
  Filled 2015-03-23: qty 250

## 2015-03-23 MED ORDER — VANCOMYCIN HCL 10 G IV SOLR
1250.0000 mg | INTRAVENOUS | Status: AC
  Administered 2015-03-24: 1250 mg via INTRAVENOUS
  Filled 2015-03-23: qty 1250

## 2015-03-23 MED ORDER — SODIUM CHLORIDE 0.9 % IV SOLN
INTRAVENOUS | Status: DC
  Filled 2015-03-23: qty 30

## 2015-03-23 MED ORDER — DEXMEDETOMIDINE HCL IN NACL 400 MCG/100ML IV SOLN
0.1000 ug/kg/h | INTRAVENOUS | Status: AC
Start: 2015-03-24 — End: 2015-03-24
  Administered 2015-03-24: .3 ug/kg/h via INTRAVENOUS
  Filled 2015-03-23: qty 100

## 2015-03-23 MED ORDER — DEXTROSE 5 % IV SOLN
0.0000 ug/min | INTRAVENOUS | Status: AC
  Administered 2015-03-24: 2.5 ug/min via INTRAVENOUS
  Filled 2015-03-23: qty 4

## 2015-03-23 MED ORDER — NOREPINEPHRINE BITARTRATE 1 MG/ML IV SOLN
0.0000 ug/min | INTRAVENOUS | Status: AC
  Administered 2015-03-24: 2 ug/min via INTRAVENOUS
  Filled 2015-03-23: qty 4

## 2015-03-23 MED ORDER — DOPAMINE-DEXTROSE 3.2-5 MG/ML-% IV SOLN
0.0000 ug/kg/min | INTRAVENOUS | Status: DC
  Filled 2015-03-23: qty 250

## 2015-03-23 MED ORDER — LEVOFLOXACIN IN D5W 500 MG/100ML IV SOLN
500.0000 mg | INTRAVENOUS | Status: AC
  Administered 2015-03-24: 500 mg via INTRAVENOUS
  Filled 2015-03-23: qty 100

## 2015-03-23 MED ORDER — SODIUM CHLORIDE 0.9 % IV SOLN
INTRAVENOUS | Status: AC
  Administered 2015-03-24: 2 [IU]/h via INTRAVENOUS
  Filled 2015-03-23: qty 2.5

## 2015-03-23 NOTE — Progress Notes (Signed)
[address]       [place] [address]             [phone]     CARDIOTHORACIC SURGERY OFFICE NOTE  Referring Provider is [name], MD PCP is [name], MD   HPI:  Patient returns to the office today for follow-up of low flow low gradient severe symptomatic aortic stenosis with tentative plans to proceed with transcatheter aortic valve replacement tomorrow.  She was last seen here in our office on 03/10/2015 at which time we made final plans for surgery. The patient reports no new problems or complaints over the last few weeks.  She underwent CT angiogram of the abdomen and pelvis on 03/17/2015 which confirmed the presence of adequate pelvic vascular access for transfemoral approach for surgery. The CT scan did also reveal mild enlargement of the patient's left ovary which will require follow-up in the future.   Current Outpatient Prescriptions  Medication Sig Dispense Refill  . acetaminophen (TYLENOL) 500 MG tablet Take 1,000 mg by mouth every 6 (six) hours as needed for moderate pain.    Marland Kitchen aspirin EC 81 MG tablet Take 81 mg by mouth daily.    . carvedilol (COREG) 25 MG tablet Take 25 mg by mouth 2 (two) times daily with a meal.     . CRESTOR 40 MG tablet Take 40 mg by mouth daily.     Regino Schultze TEST test strip 1 each by Other route See admin instructions. Check blood sugar twice daily    . fenofibrate (TRICOR) 145 MG tablet Take 145 mg by mouth daily.     . furosemide (LASIX) 40 MG tablet Take 40 mg by mouth daily.     Marland Kitchen glipiZIDE (GLUCOTROL XL) 5 MG 24 hr tablet Take 5 mg by mouth daily.     Marland Kitchen HUMALOG MIX 50/50 KWIKPEN (50-50) 100 UNIT/ML Kwikpen Inject 12 Units into the skin 2 (two) times daily.     . Multiple Vitamins-Minerals (MULTIVITAMIN PO) Take 1 tablet by mouth daily.    . niacin (NIASPAN) 1000 MG CR tablet Take 1,000 mg by mouth at bedtime.     Marland Kitchen spironolactone (ALDACTONE) 25 MG tablet Take 25 mg by mouth daily.     Marland Kitchen UNIFINE  PENTIPS 31G X 8 MM MISC 1 each by Other route See admin instructions. Use as directed with insulin twice daily    . [DISCONTINUED] cetirizine (ZYRTEC) 10 MG tablet Take 1 tablet (10 mg total) by mouth daily. 30 tablet 3  . [DISCONTINUED] ferrous sulfate 325 (65 FE) MG EC tablet Take 1 tablet (325 mg total) by mouth 2 (two) times daily. 60 tablet 5  . [DISCONTINUED] FLUoxetine (PROZAC) 10 MG tablet Take 1 tablet (10 mg total) by mouth daily. Take one capsule by mouth once a day 30 tablet 3  . [DISCONTINUED] fluticasone (FLONASE) 50 MCG/ACT nasal spray Place 2 sprays into the nose daily. 16 g 3   No current facility-administered medications for this visit.   Facility-Administered Medications Ordered in Other Visits  Medication Dose Route Frequency Provider Last Rate Last Dose  . [START ON 03/24/2015] dexmedetomidine (PRECEDEX) 400 MCG/100ML (4 mcg/mL) infusion  0.1-0.7 mcg/kg/hr Intravenous To OR Karren Cobble, RPH      . [START ON 03/24/2015] DOPamine (INTROPIN) 800 mg in dextrose 5 % 250 mL (3.2 mg/mL) infusion  0-10 mcg/kg/min Intravenous To OR Karren Cobble, RPH      . [START ON 03/24/2015]  EPINEPHrine (ADRENALIN) 4 mg in dextrose 5 % 250 mL (0.016 mg/mL) infusion  0-10 mcg/min Intravenous To OR Karren Cobble, RPH      . [START ON 03/24/2015] heparin 30,000 units/NS 1000 mL solution for CELLSAVER   Other To OR Karren Cobble, RPH      . [START ON 03/24/2015] insulin regular (NOVOLIN R,HUMULIN R) 250 Units in sodium chloride 0.9 % 250 mL (1 Units/mL) infusion   Intravenous To OR Karren Cobble, RPH      . [START ON 03/24/2015] levofloxacin (LEVAQUIN) IVPB 500 mg  500 mg Intravenous To OR Karren Cobble, RPH      . [START ON 03/24/2015] magnesium sulfate (IV Push/IM) injection 40 mEq  40 mEq Other To OR Karren Cobble, RPH      . [START ON 03/24/2015] nitroGLYCERIN 50 mg in dextrose 5 % 250 mL (0.2 mg/mL) infusion  2-200 mcg/min Intravenous To OR Karren Cobble, RPH        . [START ON 03/24/2015] norepinephrine (LEVOPHED) 4 mg in dextrose 5 % 250 mL (0.016 mg/mL) infusion  0-10 mcg/min Intravenous To OR Karren Cobble, RPH      . [START ON 03/24/2015] phenylephrine (NEO-SYNEPHRINE) 20 mg in dextrose 5 % 250 mL (0.08 mg/mL) infusion  30-200 mcg/min Intravenous To OR Karren Cobble, RPH      . [START ON 03/24/2015] potassium chloride injection 80 mEq  80 mEq Other To OR Karren Cobble, RPH      . [START ON 03/24/2015] vancomycin (VANCOCIN) 1,250 mg in sodium chloride 0.9 % 250 mL IVPB  1,250 mg Intravenous To OR Karren Cobble, [facility]          Physical Exam:   BP 126/79 mmHg  Pulse 80  Resp 20  Ht 5' 3.5" (1.613 m)  Wt 185 lb (83.915 kg)  BMI 32.25 kg/m2  SpO2 98%  General:  Well-appearing  Chest:   clear  CV:   Regular rate and rhythm with late peaking systolic murmur  Incisions:  n/a  Abdomen:  Soft and nontender  Extremities:  Warm and well-perfused  Diagnostic Tests:  CTA ABDOMEN AND PELVIS WITH CONTRAST  TECHNIQUE: Multidetector CT imaging of the abdomen and pelvis was performed using the standard protocol during bolus administration of intravenous contrast. Multiplanar reconstructed images and MIPs were obtained and reviewed to evaluate the vascular anatomy.  CONTRAST: 48mL OMNIPAQUE IOHEXOL 350 MG/ML SOLN  COMPARISON: No priors.  FINDINGS: CTA ABDOMEN AND PELVIS FINDINGS  Lower Thorax: Mild scarring in the right middle lobe. Cardiomegaly with left ventricular dilatation. 4 mm nodule in the left lower lobe (image 4 of series 407), slightly smaller compared to prior study 02/10/2014, favored to be benign.  Hepatobiliary: Calcified granuloma in the right lobe of the liver. No cystic or solid hepatic lesions are otherwise noted. No intra or extrahepatic biliary ductal dilatation. Small amount of dependent high attenuation material in the gallbladder may reflect biliary sludge or tiny gallstones. No current findings  to suggest acute cholecystitis at this time.  Pancreas: No pancreatic mass. No pancreatic ductal dilatation. No pancreatic or peripancreatic fluid or inflammatory changes.  Spleen: Unremarkable.  Adrenals/Urinary Tract: Bilateral adrenal glands are normal in appearance. Mild multifocal cortical thinning in the kidneys bilaterally. No suspicious renal lesions. No hydroureteronephrosis. Urinary bladder is normal in appearance.  Stomach/Bowel: Normal appearance of the stomach. No pathologic dilatation of small bowel or colon. A few scattered colonic diverticulae are noted, without surrounding inflammatory changes  to suggest an acute diverticulitis at this time. Normal appendix.  Vascular/Lymphatic: Vascular findings and measurements pertinent to potential TAVR procedure, as detailed below. No aneurysm or dissection noted in the visualized abdominal and pelvic vasculature. No lymphadenopathy noted in the abdomen or pelvis.  Reproductive: Uterus is unremarkable in appearance. Right ovary appears atrophic. Left ovary is mildly enlarged measuring approximately 2.9 x 4.2 x 3.3 cm.  Other: No significant volume of ascites. No pneumoperitoneum.  Musculoskeletal: There are no aggressive appearing lytic or blastic lesions noted in the visualized portions of the skeleton.  VASCULAR MEASUREMENTS PERTINENT TO TAVR:  AORTA:  Minimal Aortic Diameter - 13 x 17 mm  Severity of Aortic Calcification - mild  RIGHT PELVIS:  Right Common Iliac Artery -  Minimal Diameter - 9.2 x 8.4 mm  Tortuosity - mild  Calcification - mild  Right External Iliac Artery -  Minimal Diameter - 7.3 x 8.0 mm  Tortuosity - moderate  Calcification - mild  Right Common Femoral Artery -  Minimal Diameter - 7.4 x 6.1 mm  Tortuosity - mild  Calcification - mild  LEFT PELVIS:  Left Common Iliac Artery -  Minimal Diameter - 8.9 x 10.0 mm  Tortuosity -  mild  Calcification - mild  Left External Iliac Artery -  Minimal Diameter - 6.6 x 6.2 mm  Tortuosity - moderate  Calcification - mild  Left Common Femoral Artery -  Minimal Diameter - 7.6 x 7.0 mm  Tortuosity - mild  Calcification - mild  Review of the MIP images confirms the above findings.  IMPRESSION: 1. Vascular findings and measurements pertinent to potential TAVR procedure, as detailed above. This patient does appear to have suitable pelvic arterial access bilaterally. 2. Despite the patient's postmenopausal status, the patient's left ovary appears mildly enlarged. Further evaluation with nonemergent transvaginal ultrasound is recommended in the near future to better evaluate this finding. 3. Cholelithiasis and/or biliary sludge lying dependently in the gallbladder, without findings to suggest an acute cholecystitis at this time. 4. 4 mm left lower lobe pulmonary nodule slightly smaller than remote prior study 02/10/2014, considered benign. 5. Additional incidental findings, as above.   Electronically Signed  By: Vinnie Langton M.D.  On: 03/17/2015 12:21    Impression:  Patient has stage D severe low flow low gradient aortic stenosis. She originally presented more than 2 years ago with decompensated acute exacerbation of chronic combined systolic and diastolic congestive heart failure. More recently she has been quite stable on medical therapy, although she experiences chronic symptoms of exertional shortness of breath and fatigue consistent with chronic combined systolic and diastolic congestive heart failure, New York Heart Association class IIB-III. Recent follow-up echocardiogram demonstrates further deterioration in the patient's left ventricular systolic function with ejection fraction estimated 10-15%. Previous diagnostic cardiac catheterization is notable for the absence of significant coronary artery disease. Risks associated with  conventional surgical aortic valve replacement would clearly be extremely high because of the patient's profound left ventricular systolic dysfunction and numerous comorbid medical problems including stage III chronic kidney disease. I feel that transcatheter aortic valve replacement would be a less invasive and less risky alternative to high risk conventional surgery. Cardiac gated CT angiogram of the heart demonstrates anatomical findings suitable for transcatheter aortic valve replacement without any complicating features.  CT angiogram of the abdomen and pelvis demonstrates that the patient has adequate pelvic vascular access for a transfemoral approach.    Plan:  The patient was again counseled at length regarding treatment alternatives for  management of severe symptomatic aortic stenosis. Alternative approaches such as conventional aortic valve replacement, transcatheter aortic valve replacement, and palliative medical therapy were compared and contrasted at length. The risks associated with conventional surgical aortic valve replacement were been discussed in detail, as were expectations for post-operative convalescence. Long-term prognosis with medical therapy was discussed. This discussion was placed in the context of the patient's own specific clinical presentation and past medical history.  During the course of the patient's preoperative work up they have been evaluated comprehensively by a multidisciplinary team of specialists coordinated through the [facility].  They have been demonstrated to suffer from symptomatic severe aortic stenosis as noted above. The patient has been counseled extensively as to the relative risks and benefits of all options for the treatment of severe aortic stenosis including long term medical therapy, conventional surgery for aortic valve replacement, and transcatheter aortic valve replacement.   The patient has been independently evaluated by two cardiac surgeons including myself and Dr. [name], and both of [place] feel that the patient would be a poor candidate for conventional surgery.    Following the decision to proceed with transcatheter aortic valve replacement, a discussion has been held regarding what types of management strategies would be attempted intraoperatively in the event of life-threatening complications, including whether or not the patient would be considered a candidate for the use of cardiopulmonary bypass and/or conversion to open sternotomy for attempted surgical intervention.  The patient has been advised of a variety of complications that might develop including but not limited to risks of death, stroke, paravalvular leak, aortic dissection or other major vascular complications, aortic annulus rupture, device embolization, cardiac rupture or perforation, mitral regurgitation, acute myocardial infarction, arrhythmia, heart block or bradycardia requiring permanent pacemaker placement, congestive heart failure, respiratory failure, renal failure, pneumonia, infection, other late complications related to structural valve deterioration or migration, or other complications that might ultimately cause a temporary or permanent loss of functional independence or other long term morbidity.  The patient provides full informed consent for the procedure as described and all questions were answered.    I spent in excess of 15 minutes during the conduct of this office consultation and >50% of this time involved direct face-to-face encounter with the patient for counseling and/or coordination of their care.    Valentina Gu. Roxy Manns, MD 03/23/2015 4:27 PM

## 2015-03-23 NOTE — Patient Instructions (Signed)
   Patient should continue taking all medications without change through the day before surgery.  Patient should have nothing to eat or drink after midnight the night before surgery.  On the morning of surgery patient should take only carvedilol (Coreg) with a sip of water.

## 2015-03-23 NOTE — H&P (Signed)
[name]       [name] [address]             [phone]          CARDIOTHORACIC SURGERY HISTORY AND PHYSICAL EXAM  Referring Provider is [name], MD PCP is [name], MD  Chief Complaint  Patient presents with  . Aortic Stenosis    follow up     HPI:  Patient is a 69 year old African-American female with known history of low flow low gradient severe aortic stenosis, chronic combined systolic and diastolic congestive heart failure, hypertension, hyperlipidemia, type 2 diabetes mellitus with complication, and stage III chronic kidney disease returns to the office today for follow-up of severe aortic stenosis. She was originally seen in consultation on 01/29/2014. Following her initial consultation she was referred for dental service evaluation and underwent teeth extraction. She underwent diagnostic cardiac catheterization by Dr. [name] on 05/23/2014 revealing normal coronary artery anatomy with no significant coronary artery disease. She had moderate pulmonary hypertension. She was found to have a foreign body lodged in the right middle lobe bronchus with chronic right middle lobe collapse as an incidental finding on cardiac gated CT angiogram of the heart. She was referred for pulmonary consultation and underwent flexible fiberoptic bronchoscopy by Dr. [name], but he was unable to remove the foreign body. She eventually underwent rigid bronchoscopy for removal of the foreign body by Dr. [name] on 01/14/2015. The extended period of time that has elapsed since her original evaluation last June and has been largely because the patient spent much of her time at home with her elderly husband who eventually passed away earlier this year. She now returns with hope to proceed with definitive management of her aortic stenosis in the near future. She has been seen recently in follow-up by Dr. [name], and repeat transthoracic  echocardiogram confirms the presence of severe left ventricular systolic dysfunction with ejection fraction estimated 10-15%. The patient has severe low flow low gradient aortic stenosis with peak velocity across the aortic valve measured 3.5 m/s corresponding to mean transvalvular gradient of 26 mmHg. Previous dobutamine stress echocardiography performed 12/23/2013 confirmed the presence of severe aortic stenosis.  The patient is recently widowed and lives with one of her granddaughters [name]. She has remained reasonably active and functional independent for all of her life. She describes stable symptoms of exertional shortness of breath and fatigue consistent with chronic combined systolic and diastolic congestive heart failure, [place] Heart Association functional class IIB-III. Symptoms seem to wax and wane a bit in severity. The patient occasionally experiences dyspnea while trying to sleep at night. However, she denies any history of orthopnea, dizzy spells, or syncope. She has never had any chest pain or chest tightness with exertion, although she states that she occasionally gets fleeting feelings of tightness across her chest that seemed to be unrelated to activity. Her mobility remains reasonably good, although she still has degenerative arthritis that limits her walking to some degree, particularly in her left knee.        Past Medical History  Diagnosis Date  . Essential hypertension, benign   . Hyperlipidemia   . Anxiety   . Aortic stenosis   . Symptomatic PVCs   . Nonischemic cardiomyopathy     No significant obstructive CAD at heart catheterization 05/2014, LVEF 20%  . CKD (chronic kidney disease) stage 3, GFR 30-59 ml/min   . Chronic systolic heart failure   . Obesity   .  Depression   . Constipation   . Incidental pulmonary nodule, > 11mm and < 24mm     7 x 4 mm LLL nodule  . Arthritis   . LBBB (left bundle branch block)   . Pneumonia 2013  . Diabetes mellitus,  type 2   . CHF (congestive heart failure)     Past Surgical History  Procedure Laterality Date  . Cesarean section  1987  . Multiple extractions with alveoloplasty N/A 02/27/2014    Procedure: Extraction of tooth #'s 2,4,5,6,7,8,9,10,11,12, 22, 23, 24, 25, 26, 28 with alveoloplasty and bilateral mandibular tori reductions;  Surgeon: [name], DDS;  Location: [place];  Service: Oral Surgery;  Laterality: N/A;  . Tubal ligation  1987  . Video bronchoscopy Bilateral 06/05/2014    Procedure: VIDEO BRONCHOSCOPY WITHOUT FLUORO;  Surgeon: [name], MD;  Location: [place];  Service: Cardiopulmonary;  Laterality: Bilateral;  . Left and right heart catheterization with coronary angiogram N/A 12/05/2011    Procedure: LEFT AND RIGHT HEART CATHETERIZATION WITH CORONARY ANGIOGRAM;  Surgeon: [name], MD;  Location: [facility] CATH LAB;  Service: Cardiovascular;  Laterality: N/A;  . Left and right heart catheterization with coronary angiogram N/A 05/23/2014    Procedure: LEFT AND RIGHT HEART CATHETERIZATION WITH CORONARY ANGIOGRAM;  Surgeon: [name], MD;  Location: [facility] CATH LAB;  Service: Cardiovascular;  Laterality: N/A;  . Cardiac catheterization    . Flexible bronchoscopy N/A 01/14/2015    Procedure: FLEXIBLE BRONCHOSCOPY;  Surgeon: [name], MD;  Location: [place];  Service: Thoracic;  Laterality: N/A;  . Rigid bronchoscopy N/A 01/14/2015    Procedure: RIGID BRONCHOSCOPY with Removal Of Foreign Body ;  Surgeon: [name], MD;  Location: [place];  Service: Thoracic;  Laterality: N/A;    Family History  Problem Relation Age of Onset  . Heart disease Mother   . Heart attack Mother 90  . Diabetes Mother   . Hypertension Mother   . Colon cancer Father 49  . Breast cancer Sister   . Diabetes Brother   . Hypertension Brother   . Stroke Brother     Social History History  Substance Use Topics  . Smoking status: Never Smoker   . Smokeless  tobacco: Never Used  . Alcohol Use: No    Prior to Admission medications   Medication Sig Start Date End Date Taking? Authorizing Provider  acetaminophen (TYLENOL) 500 MG tablet Take 1,000 mg by mouth every 6 (six) hours as needed for moderate pain.   Yes Historical Provider, MD  aspirin EC 81 MG tablet Take 81 mg by mouth daily.   Yes Historical Provider, MD  carvedilol (COREG) 25 MG tablet Take 25 mg by mouth 2 (two) times daily with a meal.  02/23/15  Yes Historical Provider, MD  CRESTOR 40 MG tablet Take 40 mg by mouth daily.  02/18/15  Yes Historical Provider, MD  EASYMAX TEST test strip 1 each by Other route See admin instructions. Check blood sugar twice daily 02/19/15  Yes Historical Provider, MD  fenofibrate (TRICOR) 145 MG tablet Take 145 mg by mouth daily.  02/23/15  Yes Historical Provider, MD  furosemide (LASIX) 40 MG tablet Take 40 mg by mouth daily.  02/23/15  Yes Historical Provider, MD  glipiZIDE (GLUCOTROL XL) 5 MG 24 hr tablet Take 5 mg by mouth daily.  02/26/15  Yes Historical Provider, MD  HUMALOG MIX 50/50 KWIKPEN (50-50) 100 UNIT/ML Kwikpen Inject 12 Units into the skin 2 (  two) times daily.  02/06/15  Yes Historical Provider, MD  Multiple Vitamins-Minerals (MULTIVITAMIN PO) Take 1 tablet by mouth daily.   Yes Historical Provider, MD  niacin (NIASPAN) 1000 MG CR tablet Take 1,000 mg by mouth at bedtime.  02/18/15  Yes Historical Provider, MD  spironolactone (ALDACTONE) 25 MG tablet Take 25 mg by mouth daily.  01/21/15  Yes Historical Provider, MD  UNIFINE PENTIPS 31G X 8 MM MISC 1 each by Other route See admin instructions. Use as directed with insulin twice daily 01/13/15  Yes Historical Provider, MD    Allergies  Allergen Reactions  . Penicillins Other (See Comments)    Family unsure of reaction, but certain mom said she was allergic    Review of Systems:  General:normal appetite, decreased energy, no weight gain, no weight loss, no  fever Cardiac:no chest pain with exertion, occasional atypical chest pain at rest, + SOB with exertion, no resting SOB, no PND, no orthopnea, no palpitations, no arrhythmia, no atrial fibrillation, no LE edema, no dizzy spells, no syncope Respiratory:no shortness of breath, no home oxygen, no productive cough, occasional dry cough, no bronchitis, no wheezing, no hemoptysis, no asthma, no pain with inspiration or cough, no sleep apnea, no CPAP at night GI:no difficulty swallowing, no reflux, no frequent heartburn, no hiatal hernia, no abdominal pain, occasional constipation, no diarrhea, no hematochezia, no hematemesis, no melena GU:no dysuria, no frequency, no urinary tract infection, no hematuria, no kidney stones, + chronic kidney disease Vascular:no pain suggestive of claudication, no pain in feet, no leg cramps, + varicose veins, no DVT, no non-healing foot ulcer Neuro:no stroke, no TIA's, no seizures, no headaches, no temporary blindness one eye, no slurred speech, no peripheral neuropathy, no chronic pain, no instability of gait, no memory/cognitive dysfunction Musculoskeletal:+ arthritis particularly in left knee, no joint swelling, no myalgias, mild difficulty walking, normal mobility  Skin:no rash, no itching, no skin infections, no pressure sores or ulcerations Psych:no anxiety, no depression, no nervousness, no unusual recent stress Eyes:no blurry vision, + floaters, no recent vision changes, + wears glasses or contacts ENT:no hearing loss, edentulous without  dentures Hematologic:no easy bruising, no abnormal bleeding, no clotting disorder, no frequent epistaxis Endocrine:+ diabetes, checks CBG's at home     Physical Exam:  BP 128/79 mmHg  Pulse 75  Resp 16  Ht 5\' 5"  (1.651 m)  Wt 185 lb (83.915 kg)  BMI 30.79 kg/m2  SpO2 98% General:Moderately obese, well-appearing although appears older than stated age HEENT:Unremarkable  Neck:no JVD, no bruits, no adenopathy  Chest:clear to auscultation, symmetrical breath sounds, no wheezes, no rhonchi  CV:RRR, grade III/VI crescendo/decrescendo murmur heard best at RUSB, no diastolic murmur Abdomen:soft, non-tender, no masses  Extremities:warm, well-perfused, pulses palpable, + mild LE edema Rectal/GUDeferred Neuro:Grossly non-focal and symmetrical throughout Skin:Clean and dry, no rashes, no breakdown   Diagnostic Tests:  Transthoracic Echocardiography  Patient:  [name], [name] MR #:    [id number] Study Date: 02/25/2015 Gender:   F Age:    89 Height:   165.1 cm Weight:   83.5 kg BSA:    1.98 m^2 Pt. Status: Room:  ATTENDING  Darylene Price, M.D. ORDERING   Darylene Price, M.D. REFERRING  Darylene Price, M.D. PERFORMING  [facility], Forestine Na SONOGRAPHER Titus Mould, RCS  cc:  ------------------------------------------------------------------- LV EF: 10% -  15%  ------------------------------------------------------------------- Indications:   Aortic stenosis  424.1.  ------------------------------------------------------------------- Study Conclusions  - Left ventricle: The cavity size was mildly dilated. Wall thickness was normal. The estimated  ejection fraction was in the range of 10% to 15%. Diffuse hypokinesis. Features are consistent with a pseudonormal left ventricular filling pattern, with concomitant abnormal relaxation and increased filling pressure (grade 2 diastolic dysfunction). - Regional wall motion abnormality: Akinesis of the basal-mid anteroseptal, basal-mid inferoseptal, and apical septal myocardium. 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 - Aortic valve: Severely calcified annulus. Severely thickened leaflets. There is severe low flow low gradient aortic stenosis. AVA by VTI 0.91, mean gradient 26 mmHg in setting of severely decreased cardiac output. Probably trileaflet. There was mild to moderate regurgitation. Mean gradient (S): 26 mm Hg. VTI ratio of LVOT to aortic valve: 0.3. Valve area (VTI): 0.85 cm^2. Valve area (Vmax): 0.91 cm^2. Valve area (Vmean): 0.96 cm^2. Regurgitation pressure half-time: 376 ms. - Mitral valve: Mildly calcified annulus. Mildly thickened leaflets . There was mild regurgitation. The MR vena contracta is 0.3 cm. - Left atrium: The atrium was moderately dilated. - Pulmonary arteries: Systolic pressure was moderately increased. PA peak pressure: 56 mm Hg (S). - Technically difficult study.  Transthoracic echocardiography. M-mode, complete 2D, spectral Doppler, and color Doppler. Birthdate: Patient birthdate: 29-Nov-1945. Age: Patient is 69 yr old. Sex: Gender: female. BMI: 30.6 kg/m^2. Blood pressure:   110/62 Patient status: Inpatient. Study date: Study date: 02/25/2015. Study time: 02:04 PM. Location: Echo  laboratory.  -------------------------------------------------------------------  ------------------------------------------------------------------- Left ventricle: The cavity size was mildly dilated. Wall thickness was normal. The estimated ejection fraction was in the range of 10% to 15%. Diffuse hypokinesis. Regional wall motion abnormalities: Akinesis of the basal-mid anteroseptal, basal-mid inferoseptal, and apical septal myocardium. 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 Features are consistent with a pseudonormal left ventricular filling pattern, with concomitant abnormal relaxation and increased filling pressure (grade 2 diastolic dysfunction).  ------------------------------------------------------------------- Aortic valve:  Severely calcified annulus. Severely thickened leaflets. There is severe low flow low gradient aortic stenosis. AVA by VTI 0.91, mean gradient 26 mmHg in setting of severely decreased cardiac output. Probably trileaflet. Doppler: There was mild to moderate regurgitation.  VTI ratio of LVOT to aortic valve: 0.3. Valve area (VTI): 0.85 cm^2. Indexed valve area (VTI): 0.43 cm^2/m^2. Peak velocity ratio of LVOT to aortic valve: 0.32. Valve area (Vmax): 0.91 cm^2. Indexed valve area (Vmax): 0.46 cm^2/m^2. Mean velocity ratio of LVOT to aortic valve: 0.3. Valve area (Vmean): 0.96 cm^2. Indexed valve area (Vmean): 0.48 cm^2/m^2.  Mean gradient (S): 26 mm Hg. Peak gradient (S): 48 mm Hg.  ------------------------------------------------------------------- Aorta: Aortic root: The aortic root was normal in size.  ------------------------------------------------------------------- Mitral valve:  Mildly calcified annulus. Mildly thickened leaflets . Doppler:  There was no evidence for stenosis.  There was mild regurgitation. The MR vena contracta is 0.3 cm.  Peak gradient (D): 6 mm  Hg.  ------------------------------------------------------------------- Left atrium: The atrium was moderately dilated.  ------------------------------------------------------------------- Atrial septum: Poorly visualized.  ------------------------------------------------------------------- Right ventricle: The cavity size was normal. Systolic function was normal.  ------------------------------------------------------------------- Pulmonic valve:  Not well visualized. Doppler:  There was no evidence for stenosis.  There was no significant regurgitation.  ------------------------------------------------------------------- Tricuspid valve:  Normal thickness leaflets. Doppler:  There was no evidence for stenosis.  There was mild regurgitation.  ------------------------------------------------------------------- Pulmonary artery:  Systolic pressure was moderately increased.  ------------------------------------------------------------------- Right atrium: The atrium was normal in size.  ------------------------------------------------------------------- Pericardium: There was no pericardial effusion.  ------------------------------------------------------------------- Systemic veins: Inferior vena cava: The vessel was normal in size. The respirophasic diameter changes were in the normal range (>= 50%), consistent with normal central venous pressure.  ------------------------------------------------------------------- Measurements  Left ventricle              Value      Reference LV ID, ED, PLAX chordal     (H)   62   mm    43 - 52 LV ID, ES, PLAX chordal     (H)   52   mm    23 - 38 LV fx shortening, PLAX chordal  (L)   16   %    >=29 LV PW thickness, ED           8   mm    --------- IVS/LV PW ratio, ED           1        <=1.3 Stroke volume, 2D             77   ml    --------- Stroke volume/bsa, 2D          39   ml/m^2  ---------  Ventricular septum            Value      Reference IVS thickness, ED            8   mm    ---------  LVOT                   Value      Reference LVOT ID, S                20   mm    --------- LVOT area                3.14  cm^2   --------- LVOT peak velocity, S          112.25 cm/s   --------- LVOT mean velocity, S          71.2  cm/s   --------- LVOT VTI, S               26.45 cm    --------- LVOT peak gradient, S          5   mm Hg  --------- Stroke volume (SV), LVOT DP       75   ml    --------- Stroke index (SV/bsa), LVOT DP      37.8  ml/m^2  ---------  Aortic valve               Value      Reference Aortic valve peak velocity, S      346  cm/s   --------- Aortic valve mean velocity, S      234  cm/s   --------- Aortic valve VTI, S           88.9  cm    --------- Aortic mean gradient, S         26   mm Hg  --------- Aortic peak gradient, S         48   mm Hg  --------- VTI ratio, LVOT/AV            0.3       --------- Aortic valve area, VTI          0.85  cm^2   --------- Aortic valve area/bsa, VTI        0.43  cm^2/m^2 --------- Velocity ratio, peak, LVOT/AV      0.32      --------- Aortic valve area, peak velocity     0.91  cm^2   --------- Aortic valve area/bsa, peak       0.46  cm^2/m^2 --------- velocity Velocity ratio, mean, LVOT/AV      0.3       --------- Aortic valve area, mean velocity     0.96  cm^2   --------- Aortic valve area/bsa, mean       0.48  cm^2/m^2 --------- velocity Aortic regurg pressure  half-time     376  ms    ---------  Aorta                  Value      Reference Aortic root ID, ED            26   mm    ---------  Left atrium               Value      Reference LA ID, A-P, ES              43   mm    --------- LA volume/bsa, S             38   ml/m^2  ---------  Mitral valve               Value      Reference Mitral E-wave peak velocity       125  cm/s   --------- Mitral A-wave peak velocity       130  cm/s   --------- Mitral deceleration time     (H)   280  ms    150 - 230 Mitral peak gradient, D         6   mm Hg  --------- Mitral E/A ratio, peak          1        --------- Mitral regurg VTI, PISA         193  cm    ---------  Pulmonary arteries            Value      Reference PA pressure, S, DP        (H)   56   mm Hg  <=30  Right ventricle             Value      Reference RV ID, ED, PLAX             23.8  mm    19 - 38 TAPSE                  29.1  mm    --------- RV s&', lateral, S            11.1  cm/s   ---------  Legend: (L) and (H) mark values outside specified reference range.  ------------------------------------------------------------------- Prepared and Electronically Authenticated by  Kerry Hough, M.D. 2016-07-13T16:12:55   Stress Echocardiography  Patient:  Eneida, Bassi MR #:    [id number] Study Date: 12/23/2013 Gender:   F Age:    59 Height:   157.5cm Weight:   80.9kg BSA:    1.59m^2 Pt. Status: Room:  ATTENDING  Buel Ream, Lisabeth Devoid SONOGRAPHER [place],  Outpatient cc:  ------------------------------------------------------------  ------------------------------------------------------------ Indications:   Aortic stenosis 424.1.  ------------------------------------------------------------ History:  PMH: Non-ischemic cardiomyopathy. Aortic stenosis. Mitral valve disease. Risk factors: Hypertension. Dyslipidemia.  ------------------------------------------------------------ Study Conclusions  Stress ECG conclusions: The stress ECG was non-diagnostic  due to baseline LBBB.  Impressions:  - Dobutamine echocardiogram to assess severity of AS. Note baseline LV function appeared to be severely reduced on parasternal images but no further imaging of LV function performed. Baseline images reveal peak velocity of 3.75 m/s with mean gradient of 31 mmHg and AVA of 0.4 cm2; with dobutamine at 20 ug, peak velocity increased to 4.2 m/s and mean gradient of 38 mmHg with AVA 0.4 cm2. Findings consistent with severe AS. Dobutamine. Stress echocardiography. 2D. Height: Height: 157.5cm. Height: 62in. Weight: Weight: 80.9kg. Weight: 178lb. Body mass index: BMI: 32.6kg/m^2. Body surface area:  BSA: 1.64m^2. Blood pressure:   99/51. Patient status: Outpatient.  ------------------------------------------------------------  ------------------------------------------------------------ Aortic valve:  Doppler:   VTI ratio of LVOT to aortic valve: 0.12. Indexed valve area: 0.2cm^2/m^2 (VTI). Peak velocity ratio of LVOT to aortic valve: 0.13. Valve area: 0.4cm^2 (Vmax). Indexed valve area: 0.21cm^2/m^2 (Vmax). Mean gradient: 57mm Hg (S). Peak gradient: 55mm Hg (S).  ------------------------------------------------------------ Baseline ECG:  Normal sinus rhythm with left bundle branch block.  ------------------------------------------------------------ Stress  protocol:  +-----------+--+-------+---+------------+--------+---------+ Stage   HRBP   SatRhythm   SymptomsComments         (mmHg)                   +-----------+--+-------+---+------------+--------+---------+ Baseline  73120/70 95%------------None  ---------        (87)                    +-----------+--+-------+---+------------+--------+---------+ Dobutamine 73110/76 95%----------------------------- 5 ug/kg/min (87)                    +-----------+--+-------+---+------------+--------+---------+ Dobutamine 73124/74 98%Occasional --------Patient  10      (91)    PVC's        states   ug/kg/min                  she feels                        "weird".  +-----------+--+-------+---+------------+--------+---------+ Dobutamine 75130/72 ---Ventricular ----------------- 20      (91)    couplets            ug/kg/min                        +-----------+--+-------+---+------------+--------+---------+ Immediate 67--------------------------------------- post stress                       +-----------+--+-------+---+------------+--------+---------+ Recovery; [id number] 97%----------------------------- min     (102)                   +-----------+--+-------+---+------------+--------+---------+ Recovery; 283--------------------------------------- min                           +-----------+--+-------+---+------------+--------+---------+ Recovery; 375--------------------------------------- min                           +-----------+--+-------+---+------------+--------+---------+ Recovery;  [id number] -------------------------------- min     (101)                   +-----------+--+-------+---+------------+--------+---------+ Recovery; 570-------96%----------------------------- min                           +-----------+--+-------+---+------------+--------+---------+ Recovery; 688--------------------------------------- min                           +-----------+--+-------+---+------------+--------+---------+  ------------------------------------------------------------  Stress results:  Maximal heart rate during stress was 88bpm. The maximal predicted heart rate was 152bpm.The target heart rate was not achieved. The heart rate response to stress was normal. There was a normal resting blood pressure. Normal blood pressure response to dobutamine. The rate-pressure product for the peak heart rate and blood pressure was 10266mm Hg/min. The patient experienced no chest pain during stress.  ------------------------------------------------------------ Stress ECG:  Isolated ventricular ectopy. The stress ECG was non-diagnostic due to baseline LBBB.  ------------------------------------------------------------ Baseline:  Low dose: Peak stress: Recovery:  ------------------------------------------------------------  2D measurements  Normal    Doppler measurements  Norma LVOT                          l Diam, S  20 mm  ------    LVOT Area  3.14 cm^2 ------    Peak vel, 49 cm/s   -----                S                VTI, S  10. cm    -----                      5                Aortic valve                Peak vel, 389 cm/s   -----                S                Mean vel, 267 cm/s    -----                S                VTI, S  86. cm    -----                      8                Mean    34 mm Hg  -----                gradient,                S                Peak    61 mm Hg  -----                gradient,                S                VTI ratio 0.1     -----                LVOT/AV   2                Area   0.2 cm^2/m^2 -----                index                (VTI)                Peak vel 0.1     -----  ratio,   3                LVOT/AV                Area,   0.4 cm^2   -----                Vmax                Area   0.2 cm^2/m^2 -----                index    1                (Vmax)  ------------------------------------------------------------ Prepared and Electronically Authenticated by  Kirk Ruths 2015-05-11T19:09:[id number]    Cardiac Catheterization Operative Report  KATONYA SCOBLE [id number] 10/9/201511:21 AM [name], MD  Procedure Performed:  1. Left Heart Catheterization 2. Selective Coronary Angiography 3. Right Heart Catheterization  Operator: Lauree Chandler, MD  Indication: 69 yo female with history of severe AS, HTN, HLD, non-ischemic cardiomyopathy with plans for AVR. Here today for right and left heart cath.    Procedure Details: The risks, benefits, complications, treatment options, and expected outcomes were discussed with the patient. The patient and/or family concurred with the proposed plan, giving informed consent. The patient was brought to the  cath lab after IV hydration was begun and oral premedication was given. The patient was further sedated with Versed and Fentanyl. The right groin was prepped and draped in the usual manner. Using the modified Seldinger access technique, a 5 French sheath was placed in the right femoral artery. A 7 French sheath was placed in the right femoral vein. A balloon tipped catheter was used to perform a right heart catheterization. Standard diagnostic catheters were used to perform selective coronary angiography. A pigtail catheter was used to cross the aortic valve. LV pressures measured. No LV gram. There were no immediate complications. The patient was taken to the recovery area in stable condition.   Hemodynamic Findings: Ao: 137/72  LV: 164/15/21 RA: 11  RV: 50/13/13 PA: 43/17 (mean 27)  PCWP: 25 Fick Cardiac Output: 5.3 L/min Fick Cardiac Index: 2.84 L/min/m2 Central Aortic Saturation: 97% Pulmonary Artery Saturation: 67%  Aortic valve Data:  27 mmHg peak to peak gradient 15.8 mm HG mean gradient AVA calculated 1.4 cm2  Angiographic Findings:  Left main: No evidence of disease.   Left Anterior Descending Artery: Large caliber vessel that courses to the apex. Large caliber diagonal branch. No evidence of disease.   Circumflex Artery: Large caliber vessel with large obtuse marginal branch. No evidence of disease.   Right Coronary Artery: Moderate sized dominant vessel. No evidence of disease.   Impression: 1. No angiographic evidence of CAD 2. Aortic stenosis, severe by dobutamine echo  Recommendations: Planning in place for aortic valve replacement. Pt will be admitted overnight for hydration post cath given renal insufficiency.    Complications: None; patient tolerated the procedure well.     Cardiac TAVR CT  TECHNIQUE: The patient was scanned on a Philips 256 scanner. A 120 kV retrospective scan was triggered in the descending  thoracic aorta at 111 HU's. Gantry rotation speed was 270 msecs and collimation was .9 mm. 2.5 mg of iv Metoprolol and no nitro were given. The 3D data set was reconstructed in 5% intervals of the R-R cycle. Systolic and diastolic phases were analyzed on a dedicated work station using MPR, MIP and VRT modes. The patient received 80 cc  of contrast.  FINDINGS: Aortic Valve: Trileaflet, minimally calcified with restricted leaflet opening  Aorta: Visualized only up the the proximal portion of the ascending thoracic aorta.  Sinotubular Junction: 24 x 24 mm  Ascending Thoracic Aorta: Not visualized  Aortic Arch: Not visualized  Descending Thoracic Aorta: Not visualized  Sinus of Valsalva Measurements:  Non-coronary: 27 mm  Right -coronary: 28 mm  Left -coronary: 27 mm  Coronary Artery Height above Annulus:  Left Main: 13 mm  Right Coronary: 10 mm  Virtual Basal Annulus Measurements:  Maximum/Minimum Diameter: 27 x 22 mm  Perimeter: 92 mm  Area: 455 mm2  Coronary Arteries: The study not intended for coronary evaluation and without use of NTG. There is significant motion in the RCA.  Normal origin of coronary arteries. Right dominance. Mild calcified plague in the mid RCA. Otherwise normal coronaries. Mid to distal LAD is not visualized.  Optimum Fluoroscopic Angle for Delivery: RAO 0, CRA 5.  IMPRESSION: 1. Aortic valve measurements optimal for delivery of 26 mm Edward - Sapien XT THV.  2. Sufficient annulus to coronary distance for delivery of 26 mm Edward - Sapien XT THV.  3. Optimum Fluoroscopic Angle for Delivery is RAO 0, CRA 5.  Ena Dawley   Electronically Signed  By: Ena Dawley  On: 02/10/2014 18:48      Study Result     EXAM: OVER-READ INTERPRETATION CT CHEST  The following report is an over-read performed by radiologist Dr. Rebekah Chesterfield [facility] on  02/10/2014. This over-read does not include interpretation of cardiac or coronary anatomy or pathology. The coronary calcium score/coronary CTA interpretation by the cardiologist is attached.  COMPARISON: No priors.  FINDINGS: Linear opacity in the inferior segment of the lingula is compatible with subsegmental atelectasis or scarring. 7 x 4 mm left lower lobe nodule (image 53 of series 502). Calcification in the distal bronchus intermedius which appears to extend in the proximal right middle lobe bronchus. This is associated with what appears to be chronic collapse of the right middle lobe where there is some mild cylindrical bronchiectasis. No pleural effusions. Small calcifications in the liver are compatible with calcified granulomas. There are no aggressive appearing lytic or blastic lesions noted in the visualized portions of the skeleton.  IMPRESSION: 1. 7 x 4 mm left lower lobe pulmonary nodule. If the patient is at high risk for bronchogenic carcinoma, follow-up chest CT at 3-62months is recommended. If the patient is at low risk for bronchogenic carcinoma, follow-up chest CT at 6-12 months is recommended. This recommendation follows the consensus statement: Guidelines for Management of Small Pulmonary Nodules Detected on CT Scans: A Statement from the [place] as published in Radiology 2005; 237:395-400. 2. There appears to be a calcified broncholith within the distal bronchus intermedius and proximal right middle lobe bronchus which is presumably obstructive, as there is what appears to be chronic collapse of the right middle lobe. Nonemergent bronchoscopic correlation may be warranted if clinically indicated.  Electronically Signed: By: Vinnie Langton M.D. On: 02/10/2014 15:38      STS Risk Calculator  ProcedureAVR  Risk of Mortality7.8% Morbidity or  Mortality43.4% Prolonged LOS27.9% Short LOS8.0% Permanent Stroke2.5% Prolonged Vent Support32.3% DSW Infection1.1% Renal Failure15.1% Reoperation15.7%    Impression:  Patient has stage D severe low flow low gradient aortic stenosis. She originally presented more than 2 years ago with decompensated acute exacerbation of chronic combined systolic and diastolic congestive heart failure. More recently she has been quite stable on medical therapy, although she  experiences chronic symptoms of exertional shortness of breath and fatigue consistent with chronic combined systolic and diastolic congestive heart failure, [place] Heart Association class IIB-III. Recent follow-up echocardiogram demonstrates further deterioration in the patient's left ventricular systolic function with ejection fraction estimated 10-15%. Previous diagnostic cardiac catheterization is notable for the absence of significant coronary artery disease. Risks associated with conventional surgical aortic valve replacement would clearly be high because of the patient's profound left ventricular systolic dysfunction and numerous comorbid medical problems including stage III chronic kidney disease. Transcatheter aortic valve replacement might be a reasonable alternative to high risk conventional surgery. Cardiac gated CT angiogram of the heart demonstrates anatomical findings suitable for transcatheter aortic valve replacement without any complicating features. The patient will need CT angiogram of the aorta and iliac vessels to evaluate whether or not she has adequate pelvic vascular access for transfemoral approach.     Plan:  The patient and her son were counseled at  length regarding treatment alternatives for management of severe symptomatic aortic stenosis. Alternative approaches such as conventional aortic valve replacement, transcatheter aortic valve replacement, and palliative medical therapy were compared and contrasted at length. The risks associated with conventional surgical aortic valve replacement were been discussed in detail, as were expectations for post-operative convalescence. Long-term prognosis with medical therapy was discussed. This discussion was placed in the context of the patient's own specific clinical presentation and past medical history. The patient hopes to proceed with TAVR in the near future. We will tentatively plan for surgery on Tuesday, 03/24/2015. The patient will undergo CT angiogram of the aorta and iliac vessels as soon as practical. The patient will return for follow-up on Monday 03/23/2015 to review the results of her scan and make final plans for surgery. All of her questions been addressed.    I spent in excess of 30 minutes during the conduct of this office consultation and >50% of this time involved direct face-to-face encounter with the patient for counseling and/or coordination of their care.    Valentina Gu. Roxy Manns, MD 03/10/2015 4:40 PM

## 2015-03-24 ENCOUNTER — Inpatient Hospital Stay (HOSPITAL_COMMUNITY)
Admission: RE | Admit: 2015-03-24 | Discharge: 2015-03-27 | DRG: 267 | Disposition: A | Discharge status: Home / Self Care | Payer: Commercial Managed Care - HMO | Source: Ambulatory Visit | Attending: Thoracic Surgery (Cardiothoracic Vascular Surgery) | Admitting: Thoracic Surgery (Cardiothoracic Vascular Surgery)

## 2015-03-24 ENCOUNTER — Inpatient Hospital Stay (HOSPITAL_COMMUNITY)
Admission: RE | Admit: 2015-03-24 | Discharge: 2015-03-24 | Disposition: A | Discharge status: Home / Self Care | Payer: Commercial Managed Care - HMO | Source: Ambulatory Visit | Attending: Thoracic Surgery (Cardiothoracic Vascular Surgery) | Admitting: Thoracic Surgery (Cardiothoracic Vascular Surgery)

## 2015-03-24 ENCOUNTER — Encounter (HOSPITAL_COMMUNITY)
Admission: RE | Disposition: A | Discharge status: Home / Self Care | Payer: Commercial Managed Care - HMO | Source: Ambulatory Visit | Attending: Thoracic Surgery (Cardiothoracic Vascular Surgery)

## 2015-03-24 ENCOUNTER — Inpatient Hospital Stay (HOSPITAL_COMMUNITY): Payer: Commercial Managed Care - HMO

## 2015-03-24 ENCOUNTER — Encounter (HOSPITAL_COMMUNITY): Payer: Self-pay | Admitting: *Deleted

## 2015-03-24 ENCOUNTER — Inpatient Hospital Stay (HOSPITAL_COMMUNITY): Payer: Commercial Managed Care - HMO | Admitting: Anesthesiology

## 2015-03-24 DIAGNOSIS — E119 Type 2 diabetes mellitus without complications: Secondary | ICD-10-CM | POA: Diagnosis present

## 2015-03-24 DIAGNOSIS — I447 Left bundle-branch block, unspecified: Secondary | ICD-10-CM | POA: Diagnosis present

## 2015-03-24 DIAGNOSIS — Z953 Presence of xenogenic heart valve: Secondary | ICD-10-CM

## 2015-03-24 DIAGNOSIS — I359 Nonrheumatic aortic valve disorder, unspecified: Secondary | ICD-10-CM | POA: Diagnosis not present

## 2015-03-24 DIAGNOSIS — I272 Other secondary pulmonary hypertension: Secondary | ICD-10-CM | POA: Diagnosis present

## 2015-03-24 DIAGNOSIS — I10 Essential (primary) hypertension: Secondary | ICD-10-CM | POA: Diagnosis not present

## 2015-03-24 DIAGNOSIS — Z6832 Body mass index (BMI) 32.0-32.9, adult: Secondary | ICD-10-CM | POA: Diagnosis not present

## 2015-03-24 DIAGNOSIS — Z6826 Body mass index (BMI) 26.0-26.9, adult: Secondary | ICD-10-CM | POA: Diagnosis present

## 2015-03-24 DIAGNOSIS — Z952 Presence of prosthetic heart valve: Secondary | ICD-10-CM

## 2015-03-24 DIAGNOSIS — Z880 Allergy status to penicillin: Secondary | ICD-10-CM

## 2015-03-24 DIAGNOSIS — I509 Heart failure, unspecified: Secondary | ICD-10-CM | POA: Diagnosis not present

## 2015-03-24 DIAGNOSIS — E1122 Type 2 diabetes mellitus with diabetic chronic kidney disease: Secondary | ICD-10-CM | POA: Diagnosis present

## 2015-03-24 DIAGNOSIS — I340 Nonrheumatic mitral (valve) insufficiency: Secondary | ICD-10-CM | POA: Diagnosis not present

## 2015-03-24 DIAGNOSIS — Z79899 Other long term (current) drug therapy: Secondary | ICD-10-CM | POA: Diagnosis not present

## 2015-03-24 DIAGNOSIS — Z7982 Long term (current) use of aspirin: Secondary | ICD-10-CM | POA: Diagnosis not present

## 2015-03-24 DIAGNOSIS — Z794 Long term (current) use of insulin: Secondary | ICD-10-CM

## 2015-03-24 DIAGNOSIS — E66811 Obesity, class 1: Secondary | ICD-10-CM | POA: Diagnosis present

## 2015-03-24 DIAGNOSIS — N183 Chronic kidney disease, stage 3 (moderate): Secondary | ICD-10-CM | POA: Diagnosis present

## 2015-03-24 DIAGNOSIS — I350 Nonrheumatic aortic (valve) stenosis: Secondary | ICD-10-CM

## 2015-03-24 DIAGNOSIS — J9811 Atelectasis: Secondary | ICD-10-CM

## 2015-03-24 DIAGNOSIS — I080 Rheumatic disorders of both mitral and aortic valves: Secondary | ICD-10-CM | POA: Diagnosis present

## 2015-03-24 DIAGNOSIS — I5042 Chronic combined systolic (congestive) and diastolic (congestive) heart failure: Secondary | ICD-10-CM | POA: Diagnosis present

## 2015-03-24 DIAGNOSIS — N184 Chronic kidney disease, stage 4 (severe): Secondary | ICD-10-CM | POA: Diagnosis present

## 2015-03-24 DIAGNOSIS — I129 Hypertensive chronic kidney disease with stage 1 through stage 4 chronic kidney disease, or unspecified chronic kidney disease: Secondary | ICD-10-CM | POA: Diagnosis present

## 2015-03-24 DIAGNOSIS — I428 Other cardiomyopathies: Secondary | ICD-10-CM

## 2015-03-24 DIAGNOSIS — M1990 Unspecified osteoarthritis, unspecified site: Secondary | ICD-10-CM | POA: Diagnosis present

## 2015-03-24 DIAGNOSIS — I5022 Chronic systolic (congestive) heart failure: Secondary | ICD-10-CM | POA: Diagnosis present

## 2015-03-24 DIAGNOSIS — E669 Obesity, unspecified: Secondary | ICD-10-CM | POA: Diagnosis present

## 2015-03-24 DIAGNOSIS — Z006 Encounter for examination for normal comparison and control in clinical research program: Secondary | ICD-10-CM

## 2015-03-24 DIAGNOSIS — I429 Cardiomyopathy, unspecified: Secondary | ICD-10-CM | POA: Diagnosis present

## 2015-03-24 DIAGNOSIS — Z954 Presence of other heart-valve replacement: Secondary | ICD-10-CM | POA: Diagnosis not present

## 2015-03-24 DIAGNOSIS — E785 Hyperlipidemia, unspecified: Secondary | ICD-10-CM | POA: Diagnosis present

## 2015-03-24 DIAGNOSIS — E1121 Type 2 diabetes mellitus with diabetic nephropathy: Secondary | ICD-10-CM | POA: Diagnosis present

## 2015-03-24 DIAGNOSIS — E663 Overweight: Secondary | ICD-10-CM | POA: Diagnosis present

## 2015-03-24 HISTORY — PX: TRANSCATHETER AORTIC VALVE REPLACEMENT, TRANSFEMORAL: SHX6400

## 2015-03-24 HISTORY — DX: Presence of prosthetic heart valve: Z95.2

## 2015-03-24 HISTORY — PX: TEE WITHOUT CARDIOVERSION: SHX5443

## 2015-03-24 LAB — GLUCOSE, CAPILLARY
Glucose-Capillary: 155 mg/dL — ABNORMAL HIGH (ref 65–99)
Glucose-Capillary: 190 mg/dL — ABNORMAL HIGH (ref 65–99)
Glucose-Capillary: 178 mg/dL — ABNORMAL HIGH (ref 65–99)
Glucose-Capillary: 92 mg/dL (ref 65–99)
Glucose-Capillary: 103 mg/dL — ABNORMAL HIGH (ref 65–99)
Glucose-Capillary: 68 mg/dL (ref 65–99)
Glucose-Capillary: 87 mg/dL (ref 65–99)
Glucose-Capillary: 99 mg/dL (ref 65–99)
Glucose-Capillary: 110 mg/dL — ABNORMAL HIGH (ref 65–99)

## 2015-03-24 LAB — POCT I-STAT 4, (NA,K, GLUC, HGB,HCT)
Glucose, Bld: 233 mg/dL — ABNORMAL HIGH (ref 65–99)
HCT: 31 % — ABNORMAL LOW (ref 36.0–46.0)
Hemoglobin: 10.5 g/dL — ABNORMAL LOW (ref 12.0–15.0)
Potassium: 3.4 mmol/L — ABNORMAL LOW (ref 3.5–5.1)
Sodium: 139 mmol/L (ref 135–145)

## 2015-03-24 LAB — PREPARE RBC (CROSSMATCH)

## 2015-03-24 LAB — POCT I-STAT 3, ART BLOOD GAS (G3+)
Acid-base deficit: 5 mmol/L — ABNORMAL HIGH (ref 0.0–2.0)
Bicarbonate: 21 mEq/L (ref 20.0–24.0)
O2 Saturation: 93 %
Patient temperature: 36
TCO2: 22 mmol/L (ref 0–100)
pCO2 arterial: 38.4 mmHg (ref 35.0–45.0)
pH, Arterial: 7.341 — ABNORMAL LOW (ref 7.350–7.450)
pO2, Arterial: 67 mmHg — ABNORMAL LOW (ref 80.0–100.0)

## 2015-03-24 LAB — CBC
HCT: 29.9 % — ABNORMAL LOW (ref 36.0–46.0)
Hemoglobin: 9.6 g/dL — ABNORMAL LOW (ref 12.0–15.0)
MCH: 25.9 pg — ABNORMAL LOW (ref 26.0–34.0)
MCHC: 32.1 g/dL (ref 30.0–36.0)
MCV: 80.8 fL (ref 78.0–100.0)
Platelets: 107 10*3/uL — ABNORMAL LOW (ref 150–400)
RBC: 3.7 MIL/uL — ABNORMAL LOW (ref 3.87–5.11)
RDW: 17.1 % — ABNORMAL HIGH (ref 11.5–15.5)
WBC: 4.8 10*3/uL (ref 4.0–10.5)

## 2015-03-24 LAB — APTT: aPTT: 34 seconds (ref 24–37)

## 2015-03-24 LAB — PROTIME-INR
INR: 1.5 — ABNORMAL HIGH (ref 0.00–1.49)
Prothrombin Time: 18.1 seconds — ABNORMAL HIGH (ref 11.6–15.2)

## 2015-03-24 SURGERY — IMPLANTATION, AORTIC VALVE, TRANSCATHETER, FEMORAL APPROACH
Anesthesia: General | Site: Groin

## 2015-03-24 MED ORDER — DEXTROSE 50 % IV SOLN
INTRAVENOUS | Status: AC
  Filled 2015-03-24: qty 50

## 2015-03-24 MED ORDER — IODIXANOL 320 MG/ML IV SOLN
INTRAVENOUS | Status: DC | PRN
  Administered 2015-03-24: 29.9 mL via INTRAVENOUS

## 2015-03-24 MED ORDER — CARVEDILOL 25 MG PO TABS: 25.0000 mg | ORAL_TABLET | Freq: Two times a day (BID) | ORAL | Status: DC

## 2015-03-24 MED ORDER — MORPHINE SULFATE 2 MG/ML IJ SOLN
1.0000 mg | INTRAMUSCULAR | Status: DC | PRN
Start: 2015-03-24 — End: 2015-03-25

## 2015-03-24 MED ORDER — GLIPIZIDE ER 5 MG PO TB24
5.0000 mg | ORAL_TABLET | Freq: Every day | ORAL | Status: DC
  Administered 2015-03-25: 5 mg via ORAL
  Filled 2015-03-24 (×3): qty 1

## 2015-03-24 MED ORDER — MORPHINE SULFATE 2 MG/ML IJ SOLN: 2.0000 mg | INTRAMUSCULAR | Status: DC | PRN

## 2015-03-24 MED ORDER — FENTANYL CITRATE (PF) 100 MCG/2ML IJ SOLN
INTRAMUSCULAR | Status: DC | PRN
  Administered 2015-03-24 (×2): 100 ug via INTRAVENOUS

## 2015-03-24 MED ORDER — MORPHINE SULFATE 2 MG/ML IJ SOLN: 1.0000 mg | INTRAMUSCULAR | Status: DC | PRN

## 2015-03-24 MED ORDER — FAMOTIDINE IN NACL 20-0.9 MG/50ML-% IV SOLN
20.0000 mg | Freq: Two times a day (BID) | INTRAVENOUS | Status: AC
  Administered 2015-03-24 (×2): 20 mg via INTRAVENOUS
  Filled 2015-03-24: qty 50

## 2015-03-24 MED ORDER — LEVOFLOXACIN IN D5W 750 MG/150ML IV SOLN
750.0000 mg | INTRAVENOUS | Status: AC
  Administered 2015-03-25: 750 mg via INTRAVENOUS
  Filled 2015-03-24: qty 150

## 2015-03-24 MED ORDER — SODIUM CHLORIDE 0.9 % IR SOLN
Status: DC | PRN
  Administered 2015-03-24: 1500 mL

## 2015-03-24 MED ORDER — GLYCOPYRROLATE 0.2 MG/ML IJ SOLN
INTRAMUSCULAR | Status: DC | PRN
Start: 2015-03-24 — End: 2015-03-24
  Administered 2015-03-24: 0.4 mg via INTRAVENOUS

## 2015-03-24 MED ORDER — FENTANYL CITRATE (PF) 250 MCG/5ML IJ SOLN
INTRAMUSCULAR | Status: AC
  Filled 2015-03-24: qty 5

## 2015-03-24 MED ORDER — CHLORHEXIDINE GLUCONATE 4 % EX LIQD: 30.0000 mL | CUTANEOUS | Status: DC

## 2015-03-24 MED ORDER — DEXTROSE 50 % IV SOLN
13.0000 mL | Freq: Once | INTRAVENOUS | Status: AC
  Administered 2015-03-24: 13 mL via INTRAVENOUS

## 2015-03-24 MED ORDER — DEXMEDETOMIDINE HCL IN NACL 200 MCG/50ML IV SOLN: 0.1000 ug/kg/h | INTRAVENOUS | Status: DC

## 2015-03-24 MED ORDER — METOPROLOL TARTRATE 12.5 MG HALF TABLET: 12.5000 mg | ORAL_TABLET | Freq: Once | ORAL | Status: DC

## 2015-03-24 MED ORDER — PROTAMINE SULFATE 10 MG/ML IV SOLN
INTRAVENOUS | Status: DC | PRN
  Administered 2015-03-24: 130 mg via INTRAVENOUS

## 2015-03-24 MED ORDER — MILRINONE IN DEXTROSE 20 MG/100ML IV SOLN
0.0000 ug/kg/min | INTRAVENOUS | Status: DC
  Administered 2015-03-24: 0.25 ug/kg/min via INTRAVENOUS
  Administered 2015-03-25: 0.2 ug/kg/min via INTRAVENOUS
  Filled 2015-03-24: qty 100

## 2015-03-24 MED ORDER — ROSUVASTATIN CALCIUM 40 MG PO TABS
40.0000 mg | ORAL_TABLET | Freq: Every day | ORAL | Status: DC
  Administered 2015-03-25 – 2015-03-27 (×3): 40 mg via ORAL
  Filled 2015-03-24 (×4): qty 1

## 2015-03-24 MED ORDER — HEPARIN SODIUM (PORCINE) 1000 UNIT/ML IJ SOLN
INTRAMUSCULAR | Status: DC | PRN
  Administered 2015-03-24: 13000 [IU] via INTRAVENOUS

## 2015-03-24 MED ORDER — MILRINONE IN DEXTROSE 20 MG/100ML IV SOLN
0.3750 ug/kg/min | INTRAVENOUS | Status: AC
  Administered 2015-03-24: 0.5 ug/kg/min via INTRAVENOUS
  Filled 2015-03-24: qty 100

## 2015-03-24 MED ORDER — CETYLPYRIDINIUM CHLORIDE 0.05 % MT LIQD
7.0000 mL | Freq: Two times a day (BID) | OROMUCOSAL | Status: DC
  Administered 2015-03-24 – 2015-03-26 (×5): 7 mL via OROMUCOSAL

## 2015-03-24 MED ORDER — ONDANSETRON HCL 4 MG/2ML IJ SOLN
4.0000 mg | Freq: Four times a day (QID) | INTRAMUSCULAR | Status: DC | PRN
  Administered 2015-03-24 – 2015-03-25 (×3): 4 mg via INTRAVENOUS
  Filled 2015-03-24: qty 2

## 2015-03-24 MED ORDER — INSULIN REGULAR BOLUS VIA INFUSION
0.0000 [IU] | Freq: Three times a day (TID) | INTRAVENOUS | Status: DC
  Administered 2015-03-25: 4 [IU] via INTRAVENOUS
  Filled 2015-03-24: qty 10

## 2015-03-24 MED ORDER — MIDAZOLAM HCL 5 MG/5ML IJ SOLN
INTRAMUSCULAR | Status: DC | PRN
  Administered 2015-03-24 (×2): 1 mg via INTRAVENOUS

## 2015-03-24 MED ORDER — LIDOCAINE HCL (CARDIAC) 20 MG/ML IV SOLN
INTRAVENOUS | Status: DC | PRN
  Administered 2015-03-24: 60 mg via INTRAVENOUS
  Administered 2015-03-24: 80 mg via INTRAVENOUS

## 2015-03-24 MED ORDER — MIDAZOLAM HCL 2 MG/2ML IJ SOLN: 2.0000 mg | INTRAMUSCULAR | Status: DC | PRN

## 2015-03-24 MED ORDER — ACETAMINOPHEN 160 MG/5ML PO SOLN: 1000.0000 mg | Freq: Four times a day (QID) | ORAL | Status: DC

## 2015-03-24 MED ORDER — INSULIN REGULAR HUMAN 100 UNIT/ML IJ SOLN
INTRAMUSCULAR | Status: DC
  Administered 2015-03-24: 8.7 [IU]/h via INTRAVENOUS
  Administered 2015-03-24: 0.9 [IU]/h via INTRAVENOUS
  Filled 2015-03-24: qty 2.5

## 2015-03-24 MED ORDER — ASPIRIN EC 81 MG PO TBEC: 81.0000 mg | DELAYED_RELEASE_TABLET | Freq: Every day | ORAL | Status: DC

## 2015-03-24 MED ORDER — ASPIRIN 81 MG PO CHEW: 324.0000 mg | CHEWABLE_TABLET | Freq: Every day | ORAL | Status: DC

## 2015-03-24 MED ORDER — METOPROLOL TARTRATE 25 MG/10 ML ORAL SUSPENSION
12.5000 mg | Freq: Two times a day (BID) | ORAL | Status: DC
  Filled 2015-03-24: qty 5

## 2015-03-24 MED ORDER — ACETAMINOPHEN 650 MG RE SUPP: 650.0000 mg | Freq: Once | RECTAL | Status: DC

## 2015-03-24 MED ORDER — FUROSEMIDE 10 MG/ML IJ SOLN
40.0000 mg | Freq: Once | INTRAMUSCULAR | Status: AC
  Administered 2015-03-24: 40 mg via INTRAVENOUS
  Filled 2015-03-24: qty 4

## 2015-03-24 MED ORDER — ASPIRIN EC 325 MG PO TBEC
325.0000 mg | DELAYED_RELEASE_TABLET | Freq: Every day | ORAL | Status: DC
  Filled 2015-03-24: qty 1

## 2015-03-24 MED ORDER — SODIUM CHLORIDE 0.9 % IV SOLN: Freq: Once | INTRAVENOUS | Status: DC

## 2015-03-24 MED ORDER — 0.9 % SODIUM CHLORIDE (POUR BTL) OPTIME
TOPICAL | Status: DC | PRN
  Administered 2015-03-24: 4000 mL

## 2015-03-24 MED ORDER — ALBUMIN HUMAN 5 % IV SOLN: 250.0000 mL | INTRAVENOUS | Status: AC | PRN

## 2015-03-24 MED ORDER — OXYCODONE HCL 5 MG PO TABS: 5.0000 mg | ORAL_TABLET | ORAL | Status: DC | PRN

## 2015-03-24 MED ORDER — CLOPIDOGREL BISULFATE 75 MG PO TABS
75.0000 mg | ORAL_TABLET | Freq: Every day | ORAL | Status: DC
Start: 2015-03-25 — End: 2015-03-27
  Administered 2015-03-25 – 2015-03-27 (×3): 75 mg via ORAL
  Filled 2015-03-24 (×4): qty 1

## 2015-03-24 MED ORDER — METOPROLOL TARTRATE 1 MG/ML IV SOLN: 2.5000 mg | INTRAVENOUS | Status: DC | PRN

## 2015-03-24 MED ORDER — SODIUM CHLORIDE 0.9 % IV SOLN
INTRAVENOUS | Status: DC
  Administered 2015-03-24: 09:00:00 via INTRAVENOUS

## 2015-03-24 MED ORDER — VANCOMYCIN HCL IN DEXTROSE 1-5 GM/200ML-% IV SOLN
1000.0000 mg | Freq: Once | INTRAVENOUS | Status: AC
  Administered 2015-03-24: 1000 mg via INTRAVENOUS
  Filled 2015-03-24: qty 200

## 2015-03-24 MED ORDER — NEOSTIGMINE METHYLSULFATE 10 MG/10ML IV SOLN
INTRAVENOUS | Status: DC | PRN
  Administered 2015-03-24: 3 mg via INTRAVENOUS

## 2015-03-24 MED ORDER — TRAMADOL HCL 50 MG PO TABS: 50.0000 mg | ORAL_TABLET | ORAL | Status: DC | PRN

## 2015-03-24 MED ORDER — ACETAMINOPHEN 160 MG/5ML PO SOLN: 650.0000 mg | Freq: Once | ORAL | Status: DC

## 2015-03-24 MED ORDER — PANTOPRAZOLE SODIUM 40 MG PO TBEC
40.0000 mg | DELAYED_RELEASE_TABLET | Freq: Every day | ORAL | Status: DC
  Administered 2015-03-26 – 2015-03-27 (×2): 40 mg via ORAL
  Filled 2015-03-24 (×2): qty 1

## 2015-03-24 MED ORDER — ALBUMIN HUMAN 5 % IV SOLN
INTRAVENOUS | Status: DC | PRN
  Administered 2015-03-24 (×2): via INTRAVENOUS

## 2015-03-24 MED ORDER — ROCURONIUM BROMIDE 100 MG/10ML IV SOLN
INTRAVENOUS | Status: DC | PRN
  Administered 2015-03-24: 15 mg via INTRAVENOUS
  Administered 2015-03-24: 35 mg via INTRAVENOUS

## 2015-03-24 MED ORDER — LACTATED RINGERS IV SOLN: 500.0000 mL | Freq: Once | INTRAVENOUS | Status: DC | PRN

## 2015-03-24 MED ORDER — LACTATED RINGERS IV SOLN
INTRAVENOUS | Status: DC | PRN
  Administered 2015-03-24: 11:00:00 via INTRAVENOUS

## 2015-03-24 MED ORDER — METOPROLOL TARTRATE 12.5 MG HALF TABLET
12.5000 mg | ORAL_TABLET | Freq: Two times a day (BID) | ORAL | Status: DC
  Filled 2015-03-24 (×3): qty 1

## 2015-03-24 MED ORDER — POTASSIUM CHLORIDE 10 MEQ/50ML IV SOLN
10.0000 meq | INTRAVENOUS | Status: AC
  Administered 2015-03-24 (×2): 10 meq via INTRAVENOUS

## 2015-03-24 MED ORDER — NITROGLYCERIN IN D5W 200-5 MCG/ML-% IV SOLN: 0.0000 ug/min | INTRAVENOUS | Status: DC

## 2015-03-24 MED ORDER — ETOMIDATE 2 MG/ML IV SOLN
INTRAVENOUS | Status: DC | PRN
Start: 2015-03-24 — End: 2015-03-24
  Administered 2015-03-24: 8 mg via INTRAVENOUS

## 2015-03-24 MED ORDER — ACETAMINOPHEN 500 MG PO TABS
1000.0000 mg | ORAL_TABLET | Freq: Four times a day (QID) | ORAL | Status: DC
  Administered 2015-03-25 – 2015-03-26 (×5): 1000 mg via ORAL
  Filled 2015-03-24 (×15): qty 2

## 2015-03-24 MED ORDER — SODIUM CHLORIDE 0.9 % IV SOLN
INTRAVENOUS | Status: AC
Start: 2015-03-24 — End: 2015-03-24
  Administered 2015-03-24: 50 mL/h via INTRAVENOUS

## 2015-03-24 MED ORDER — PHENYLEPHRINE HCL 10 MG/ML IJ SOLN
0.0000 ug/min | INTRAVENOUS | Status: DC
  Filled 2015-03-24: qty 2

## 2015-03-24 MED ORDER — ETOMIDATE 2 MG/ML IV SOLN
INTRAVENOUS | Status: AC
  Filled 2015-03-24: qty 10

## 2015-03-24 MED FILL — Magnesium Sulfate Inj 50%: INTRAMUSCULAR | Qty: 2 | Status: AC

## 2015-03-24 MED FILL — Heparin Sodium (Porcine) Inj 1000 Unit/ML: INTRAMUSCULAR | Qty: 30 | Status: AC

## 2015-03-24 MED FILL — Potassium Chloride Inj 2 mEq/ML: INTRAVENOUS | Qty: 40 | Status: AC

## 2015-03-24 SURGICAL SUPPLY — 100 items
ADH SKN CLS APL DERMABOND .7 (GAUZE/BANDAGES/DRESSINGS) ×2
BAG BANDED W/RUBBER/TAPE 36X54 (MISCELLANEOUS) ×3 IMPLANT
BAG DECANTER FOR FLEXI CONT (MISCELLANEOUS) ×3 IMPLANT
BAG EQP BAND 135X91 W/RBR TAPE (MISCELLANEOUS) ×1
BAG SNAP BAND KOVER 36X36 (MISCELLANEOUS) ×6 IMPLANT
BLADE OSCILLATING /SAGITTAL (BLADE) IMPLANT
BLADE STERNUM SYSTEM 6 (BLADE) ×3 IMPLANT
BLADE SURG ROTATE 9660 (MISCELLANEOUS) ×3 IMPLANT
CABLE PACING FASLOC BIEGE (MISCELLANEOUS) ×3 IMPLANT
CABLE PACING FASLOC BLUE (MISCELLANEOUS) ×3 IMPLANT
CANNULA FEM VENOUS REMOTE 22FR (CANNULA) IMPLANT
CANNULA OPTISITE PERFUSION 16F (CANNULA) IMPLANT
CANNULA OPTISITE PERFUSION 18F (CANNULA) IMPLANT
CATH DIAG EXPO 6F VENT PIG 145 (CATHETERS) ×6 IMPLANT
CATH EXPO 5FR AL1 (CATHETERS) ×3 IMPLANT
CATH S G BIP PACING (SET/KITS/TRAYS/PACK) ×6 IMPLANT
CLIP TI MEDIUM 24 (CLIP) ×3 IMPLANT
CLIP TI WIDE RED SMALL 24 (CLIP) ×3 IMPLANT
CONT SPEC 4OZ CLIKSEAL STRL BL (MISCELLANEOUS) ×9 IMPLANT
COVER BACK TABLE 24X17X13 BIG (DRAPES) ×3 IMPLANT
COVER DOME SNAP 22 D (MISCELLANEOUS) ×3 IMPLANT
COVER MAYO STAND STRL (DRAPES) ×3 IMPLANT
COVER TABLE BACK 60X90 (DRAPES) IMPLANT
CRADLE DONUT ADULT HEAD (MISCELLANEOUS) ×3 IMPLANT
DERMABOND ADVANCED (GAUZE/BANDAGES/DRESSINGS) ×1
DERMABOND ADVANCED .7 DNX12 (GAUZE/BANDAGES/DRESSINGS) ×2 IMPLANT
DRAPE INCISE IOBAN 66X45 STRL (DRAPES) IMPLANT
DRAPE SLUSH MACHINE 52X66 (DRAPES) IMPLANT
DRAPE SLUSH/WARMER DISC (DRAPES) ×3 IMPLANT
DRAPE TABLE COVER HEAVY DUTY (DRAPES) ×3 IMPLANT
DRSG TEGADERM 4X4.75 (GAUZE/BANDAGES/DRESSINGS) ×3 IMPLANT
ELECT REM PT RETURN 9FT ADLT (ELECTROSURGICAL) ×6
ELECTRODE REM PT RTRN 9FT ADLT (ELECTROSURGICAL) ×4 IMPLANT
FELT TEFLON 6X6 (MISCELLANEOUS) ×3 IMPLANT
FEMORAL VENOUS CANN RAP (CANNULA) IMPLANT
GAUZE SPONGE 4X4 12PLY STRL (GAUZE/BANDAGES/DRESSINGS) ×3 IMPLANT
GLOVE ECLIPSE 7.5 STRL STRAW (GLOVE) ×9 IMPLANT
GLOVE ECLIPSE 8.0 STRL XLNG CF (GLOVE) ×6 IMPLANT
GLOVE EUDERMIC 7 POWDERFREE (GLOVE) ×6 IMPLANT
GLOVE ORTHO TXT STRL SZ7.5 (GLOVE) ×6 IMPLANT
GOWN STRL REUS W/ TWL LRG LVL3 (GOWN DISPOSABLE) ×12 IMPLANT
GOWN STRL REUS W/ TWL XL LVL3 (GOWN DISPOSABLE) ×12 IMPLANT
GOWN STRL REUS W/TWL LRG LVL3 (GOWN DISPOSABLE) ×12
GOWN STRL REUS W/TWL XL LVL3 (GOWN DISPOSABLE) ×12
GUIDEWIRE SAF TJ AMPL .035X180 (WIRE) ×3 IMPLANT
GUIDEWIRE SAFE TJ AMPLATZ EXST (WIRE) ×3 IMPLANT
GUIDEWIRE STRAIGHT .035 260CM (WIRE) ×3 IMPLANT
INSERT FOGARTY 61MM (MISCELLANEOUS) ×3 IMPLANT
INSERT FOGARTY SM (MISCELLANEOUS) ×6 IMPLANT
INSERT FOGARTY XLG (MISCELLANEOUS) ×3 IMPLANT
KIT BASIN OR (CUSTOM PROCEDURE TRAY) ×3 IMPLANT
KIT DILATOR VASC 18G NDL (KITS) ×3 IMPLANT
KIT HEART LEFT (KITS) ×3 IMPLANT
KIT ROOM TURNOVER OR (KITS) ×3 IMPLANT
KIT SUCTION CATH 14FR (SUCTIONS) ×3 IMPLANT
NEEDLE PERC 18GX7CM (NEEDLE) ×3 IMPLANT
NS IRRIG 1000ML POUR BTL (IV SOLUTION) ×12 IMPLANT
PACK AORTA (CUSTOM PROCEDURE TRAY) ×3 IMPLANT
PAD ARMBOARD 7.5X6 YLW CONV (MISCELLANEOUS) ×6 IMPLANT
PAD ELECT DEFIB RADIOL ZOLL (MISCELLANEOUS) ×3 IMPLANT
SHEATH PINNACLE 6F 10CM (SHEATH) ×6 IMPLANT
SLEEVE REPOSITIONING LENGTH 30 (MISCELLANEOUS) ×3 IMPLANT
SPONGE GAUZE 4X4 12PLY STER LF (GAUZE/BANDAGES/DRESSINGS) ×3 IMPLANT
SPONGE LAP 4X18 X RAY DECT (DISPOSABLE) IMPLANT
STOPCOCK 4 WAY LG BORE MALE ST (IV SETS) ×3 IMPLANT
STOPCOCK MORSE 400PSI 3WAY (MISCELLANEOUS) ×9 IMPLANT
SUT ETHIBOND X763 2 0 SH 1 (SUTURE) ×3 IMPLANT
SUT GORETEX CV 4 TH 22 36 (SUTURE) ×3 IMPLANT
SUT GORETEX CV4 TH-18 (SUTURE) ×12 IMPLANT
SUT GORETEX TH-18 36 INCH (SUTURE) IMPLANT
SUT MNCRL AB 3-0 PS2 18 (SUTURE) ×3 IMPLANT
SUT PROLENE 3 0 SH1 36 (SUTURE) IMPLANT
SUT PROLENE 4 0 RB 1 (SUTURE)
SUT PROLENE 4-0 RB1 .5 CRCL 36 (SUTURE) IMPLANT
SUT PROLENE 5 0 C 1 36 (SUTURE) ×6 IMPLANT
SUT PROLENE 6 0 C 1 30 (SUTURE) ×6 IMPLANT
SUT SILK  1 MH (SUTURE) ×2
SUT SILK 1 MH (SUTURE) ×4 IMPLANT
SUT SILK 2 0 SH CR/8 (SUTURE) IMPLANT
SUT VIC AB 2-0 CT1 27 (SUTURE)
SUT VIC AB 2-0 CT1 TAPERPNT 27 (SUTURE) IMPLANT
SUT VIC AB 2-0 CTX 36 (SUTURE) IMPLANT
SUT VIC AB 3-0 SH 8-18 (SUTURE) ×6 IMPLANT
SYR 30ML LL (SYRINGE) ×6 IMPLANT
SYR 3ML LL SCALE MARK (SYRINGE) ×6 IMPLANT
SYR 50ML LL SCALE MARK (SYRINGE) ×3 IMPLANT
SYR 5ML LL (SYRINGE) ×3 IMPLANT
SYR MEDRAD MARK V 150ML (SYRINGE) ×3 IMPLANT
SYRINGE 10CC LL (SYRINGE) ×9 IMPLANT
TAPE CLOTH SURG 4X10 WHT LF (GAUZE/BANDAGES/DRESSINGS) ×3 IMPLANT
TOWEL OR 17X26 10 PK STRL BLUE (TOWEL DISPOSABLE) ×6 IMPLANT
TRANSDUCER W/STOPCOCK (MISCELLANEOUS) ×6 IMPLANT
TRAY FOLEY IC TEMP SENS 16FR (CATHETERS) ×3 IMPLANT
TUBING ART PRESS 72  MALE/FEM (TUBING) ×1
TUBING ART PRESS 72 MALE/FEM (TUBING) ×2 IMPLANT
TUBING HIGH PRESSURE 120CM (CONNECTOR) ×3 IMPLANT
UNIVERSAL ADAPTER ×3 IMPLANT
VALVE HEART TRANSCATH SZ3 23MM (Prosthesis & Implant Heart) ×3 IMPLANT
WIRE AMPLATZ SS-J .035X180CM (WIRE) ×3 IMPLANT
WIRE J 3MM .035X145CM (WIRE) ×3 IMPLANT

## 2015-03-24 NOTE — Transfer of Care (Signed)
Immediate Anesthesia Transfer of Care Note  Patient: [name]  Procedure(s) Performed: Procedure(s): TRANSCATHETER AORTIC VALVE REPLACEMENT, TRANSFEMORAL (Bilateral) TRANSESOPHAGEAL ECHOCARDIOGRAM (TEE) (N/A)  Patient Location: SICU  Anesthesia Type:General  Level of Consciousness: awake, alert , oriented and patient cooperative  Airway & Oxygen Therapy: Patient Spontanous Breathing and Patient connected to nasal cannula oxygen  Post-op Assessment: Report given to RN, Post -op Vital signs reviewed and stable and Patient moving all extremities  Post vital signs: Reviewed and stable  Last Vitals:  Filed Vitals:   03/24/15 0759  BP: 124/61  Pulse: 74  Temp: 36.7 C  Resp: 20    Complications: No apparent anesthesia complications

## 2015-03-24 NOTE — Anesthesia Procedure Notes (Addendum)
Procedure Name: Intubation Date/Time: 03/24/2015 11:36 AM Performed by: Rebekah Chesterfield L Pre-anesthesia Checklist: Patient identified, Emergency Drugs available, Suction available, Patient being monitored and Timeout performed Patient Re-evaluated:Patient Re-evaluated prior to inductionOxygen Delivery Method: Circle system utilized Preoxygenation: Pre-oxygenation with 100% oxygen Intubation Type: IV induction Ventilation: Oral airway inserted - appropriate to patient size and Mask ventilation without difficulty Laryngoscope Size: Mac and 3 Grade View: Grade I Tube type: Oral Tube size: 7.5 mm Number of attempts: 1 Airway Equipment and Method: Stylet Placement Confirmation: ETT inserted through vocal cords under direct vision,  positive ETCO2 and breath sounds checked- equal and bilateral Secured at: 20 cm Tube secured with: Tape Dental Injury: Teeth and Oropharynx as per pre-operative assessment

## 2015-03-24 NOTE — Op Note (Signed)
CARDIOTHORACIC SURGERY OPERATIVE NOTE  Date of Procedure:  03/24/2015  Preoperative Diagnosis: Severe Aortic Stenosis   Postoperative Diagnosis: Same   Procedure:    Transcatheter Aortic Valve Replacement - Open Right Transfemoral Approach  Edwards Sapien 3 Transcatheter Heart Valve (size 23 mm, model # 9600TFX, serial # [id number])   Co-Surgeons:  Valentina Gu. Roxy Manns, MD and Lauree Chandler, MD  Assistants:   Gaye Pollack, MD   Anesthesiologist:  Laurie Panda, MD  Echocardiographer:  Ena Dawley, MD  Pre-operative Echo Findings:  Severe aortic stenosis  Severe left ventricular systolic dysfunction  Post-operative Echo Findings:  Trace paravalvular leak  Unchanged left ventricular systolic function      DETAILS OF THE OPERATIVE PROCEDURE  The majority of the procedure is documented separately in a procedure note by Dr. [name].   TRANSFEMORAL ACCESS:   A small incision is made in the right groin immediately over the common femoral artery. The subcutaneous tissues are divided with electrocautery and the anterior surface of the common femoral artery is identified. Sharp dissection is utilized to free up the artery proximally and distally.  A pair of CV-4 Gore-tex sutures are place as diamond-shaped purse-strings on the anterior surface of the femoral artery.  The patient is heparinized systemically and ACT verified > 250 seconds.  The common femoral artery is punctured using an 18 gauge needle and a soft J-tipped guidewire is passed into the common iliac artery under fluoroscopic guidance.  A 6 Fr straight diagnostic catheter is placed over the guidewire and the guidewire is removed.  An Amplatz super stiff guidewire is passed through the sheath into the descending thoracic aorta and the introducing diagnostic catheter is removed.  Serial dilators are passed over the guidewire under continuous fluoroscopic guidance, making certain that each dilator passes easily all of  the way into the distal abdominal aorta.  A 14 Fr Commander introducer sheath is passed over the guidewire into the abdominal aorta.  The introducing dilator is removed, the sheath is flushed with heparinized saline, and the sheath is secured to the skin.    FEMORAL SHEATH REMOVAL AND ARTERIAL CLOSURE:  After the completion of successful valve deployment as documented separately by Dr. [name], the femoral artery sheath is removed and the arteriotomy is closed using the previously placed Gore-tex purse-string sutures. Once the repair has been completed protamine was administered to reverse the anticoagulation. The incision is irrigated with saline solution and subsequently closed in multiple layers using absorbable suture.  The skin incision is closed using a subcuticular skin closure.     Valentina Gu. Roxy Manns MD 03/24/2015 1:34 PM

## 2015-03-24 NOTE — Progress Notes (Signed)
[facility] BRIEF SICU PROGRESS NOTE  Day of Surgery  S/P Procedure(s) (LRB): TRANSCATHETER AORTIC VALVE REPLACEMENT, TRANSFEMORAL (Bilateral) TRANSESOPHAGEAL ECHOCARDIOGRAM (TEE) (N/A)   Looks good.  Denies pain or SOB.  Mild nausea earlier - feels well presently NSR w/ stable hemodynamics on low dose milrinone and neo drip O2 sats 99-100% UOP adequate Labs okay  Plan: Continue routine early post - TAVR.  Wean milrinone off in am tomorrow.  Rexene Alberts 03/24/2015 6:19 PM

## 2015-03-24 NOTE — Op Note (Signed)
[facility]  TAVR OPERATIVE NOTE   Date of Procedure:  03/24/2015  Preoperative Diagnosis: Severe Aortic Stenosis   Postoperative Diagnosis: Same   Procedure:    Transcatheter Aortic Valve Replacement - Transfemoral Approach  Edwards Sapien 3 THV (size 23 mm, model # [id number], serial [id number] )   Co-Surgeons:  Valentina Gu. Roxy Manns, MD and Lauree Chandler, MD  Assistants:   Gaye Pollack, MD  Anesthesiologist:  Ermalene Postin  Echocardiographer:  Meda Coffee  Pre-operative Echo Findings:  Severe aortic stenosis  Severe left ventricular systolic dysfunction  Post-operative Echo Findings:  Trivial paravalvular leak  Severe left ventricular systolic dysfunction  BRIEF CLINICAL NOTE AND INDICATIONS FOR SURGERY  69 year old African-American female with known history of low flow low gradient severe aortic stenosis, chronic combined systolic and diastolic congestive heart failure, hypertension, hyperlipidemia, type 2 diabetes mellitus with complication, and stage III chronic kidney disease here today for TAVR. She was originally seen in consultation on 01/29/2014. Following her initial consultation she was referred for dental service evaluation and underwent teeth extraction. She underwent diagnostic cardiac catheterization on 05/23/2014 revealing normal coronary artery anatomy with no significant coronary artery disease. She had moderate pulmonary hypertension. She was found to have a foreign body lodged in the right middle lobe bronchus with chronic right middle lobe collapse as an incidental finding on cardiac gated CT angiogram of the heart. She was referred for pulmonary consultation and underwent flexible fiberoptic bronchoscopy by Dr. [name], but he was unable to remove the foreign body. She eventually underwent rigid bronchoscopy for removal of the foreign body by Dr. [name] on 01/14/2015. The extended period of time that has elapsed since her original evaluation  last June and has been largely because the patient spent much of her time at home with her elderly husband who eventually passed away earlier this year.She has been seen recently in follow-up by Dr. [name], and repeat transthoracic echocardiogram confirms the presence of severe left ventricular systolic dysfunction with ejection fraction estimated 10-15%. The patient has severe low flow low gradient aortic stenosis with peak velocity across the aortic valve measured 3.5 m/s corresponding to mean transvalvular gradient of 26 mmHg. Previous dobutamine stress echocardiography performed 12/23/2013 confirmed the presence of severe aortic stenosis.  During the course of the patient's preoperative work up they have been evaluated comprehensively by a multidisciplinary team of specialists coordinated through the [facility] in the [facility].  They have been demonstrated to suffer from symptomatic severe aortic stenosis as noted above. The patient has been counseled extensively as to the relative risks and benefits of all options for the treatment of severe aortic stenosis including long term medical therapy, conventional surgery for aortic valve replacement, and transcatheter aortic valve replacement.  The patient has been independently evaluated by two cardiac surgeons including Dr Roxy Manns and Dr. [name], and they are felt to be at high risk for conventional surgical aortic valve replacement based upon a predicted risk of mortality using the Society of Thor[employer] Surgeons risk calculator of 7.8%. Both surgeons indicated the patient would be a poor candidate for conventional surgery because of comorbidities including renal insufficiency and severe LV systolic dysfunction.   Based upon review of all of the patient's preoperative diagnostic tests they are felt to be candidate for transcatheter aortic valve replacement using the transfemoral approach as an alternative  to high risk conventional surgery.    Following the decision to proceed with transcatheter aortic valve replacement, a discussion has  been held regarding what types of management strategies would be attempted intraoperatively in the event of life-threatening complications, including whether or not the patient would be considered a candidate for the use of cardiopulmonary bypass and/or conversion to open sternotomy for attempted surgical intervention.  The patient has been advised of a variety of complications that might develop peculiar to this approach including but not limited to risks of death, stroke, paravalvular leak, aortic dissection or other major vascular complications, aortic annulus rupture, device embolization, cardiac rupture or perforation, acute myocardial infarction, arrhythmia, heart block or bradycardia requiring permanent pacemaker placement, congestive heart failure, respiratory failure, renal failure, pneumonia, infection, other late complications related to structural valve deterioration or migration, or other complications that might ultimately cause a temporary or permanent loss of functional independence or other long term morbidity.  The patient provides full informed consent for the procedure as described and all questions were answered preoperatively.    DETAILS OF THE OPERATIVE PROCEDURE  PREPARATION:    The patient is brought to the operating room on the above mentioned date and central monitoring was established by the anesthesia team including placement of Swan-Ganz catheter and radial arterial line. The patient is placed in the supine position on the operating table.  Intravenous antibiotics are administered. General endotracheal anesthesia is induced uneventfully. A Foley catheter is placed.  Baseline transesophageal echocardiogram was performed. The patient's chest, abdomen, both groins, and both lower extremities are prepared and draped in a sterile manner. A time out  procedure is performed.   PERIPHERAL ACCESS:    Using the modified Seldinger technique, femoral arterial and venous access was obtained with placement of 6 Fr sheaths on the left side.  A pigtail diagnostic catheter was passed through the left femoral arterial sheath under fluoroscopic guidance into the aortic root.  A temporary transvenous pacemaker catheter was passed through the left femoral venous sheath under fluoroscopic guidance into the right ventricle.  The pacemaker was tested to ensure stable lead placement and pacemaker capture. Aortic root angiography was performed in order to determine the optimal angiographic angle for valve deployment.   TRANSFEMORAL ACCESS:   A right femoral arterial cutdown was performed by Dr Roxy Manns. Please see his separate operative note for details. The patient was heparinized systemically and ACT verified > 250 seconds.    A 14 Fr transfemoral E-sheath was introduced into the right femoral artery after dilating over an Amplatz superstiff wire. A pigtail catheter was used to cross the aortic valve. Position was confirmed in the LV apex. Simultaneous LV and Ao pressures were recorded.  The pigtail catheter was then exchanged for an Amplatz Extra-stiff wire in the LV apex. At that point, BAV was performed using a 20 mm valvuloplasty balloon.  Once optimal position was achieved, BAV was done under rapid ventricular pacing at 180 bpm. The patient recovered well hemodynamically.   TRANSCATHETER HEART VALVE DEPLOYMENT:  An Edwards Sapien 3 THV (size 23 mm) was prepared and crimped per manufacturer's guidelines, and the proper orientation of the valve is confirmed on the [employer] delivery system. The valve was advanced through the introducer sheath using normal technique until in an appropriate position in the abdominal aorta beyond the sheath tip. The balloon was then retracted and using the fine-tuning wheel was centered on the valve. The valve was then advanced  across the aortic arch using appropriate flexion of the catheter. The valve was carefully positioned across the aortic valve annulus. The Commander catheter was retracted using normal technique.  Once final position of the valve has been confirmed by angiographic assessment, the valve is deployed while temporarily holding ventilation and during rapid ventricular pacing to maintain systolic blood pressure < 50 mmHg and pulse pressure < 10 mmHg. The balloon inflation is held for >3 seconds after reaching full deployment volume. Once the balloon has fully deflated the balloon is retracted into the ascending aorta and valve function is assessed using TEE. There is felt to be trivial paravalvular leak and no central aortic insufficiency.  The patient's hemodynamic recovery following valve deployment is good.  The deployment balloon and guidewire are both removed. Echo demostrated acceptable post-procedural gradients, stable mitral valve function, and trivial AI.    PROCEDURE COMPLETION:  The sheath was then removed and arteriotomy repaired by Dr Roxy Manns. Please see his separate report for details. Protamine was administered once femoral arterial repair was complete. The temporary pacemaker, pigtail catheters and femoral sheaths were removed with manual pressure used for hemostasis.   The patient tolerated the procedure well and is transported to the surgical intensive care in stable condition. There were no immediate intraoperative complications. All sponge instrument and needle counts are verified correct at completion of the operation.   No blood products were administered during the operation.  The patient received a total of 30 mL of intravenous contrast during the procedure.  Jacon Whetzel MD 03/24/2015 1:18 PM

## 2015-03-24 NOTE — Progress Notes (Signed)
  Echocardiogram Echocardiogram Transesophageal has been performed.  Jennette Dubin 03/24/2015, 1:19 PM

## 2015-03-24 NOTE — Interval H&P Note (Signed)
History and Physical Interval Note:  03/24/2015 9:53 AM  [name] [name]  has presented today for surgery, with the diagnosis of SEVERE AS  The various methods of treatment have been discussed with the patient and family. After consideration of risks, benefits and other options for treatment, the patient has consented to  Procedure(s): TRANSCATHETER AORTIC VALVE REPLACEMENT, TRANSFEMORAL (N/A) TRANSESOPHAGEAL ECHOCARDIOGRAM (TEE) (N/A) as a surgical intervention .  The patient's history has been reviewed, patient examined, no change in status, stable for surgery.  I have reviewed the patient's chart and labs.  Questions were answered to the patient's satisfaction.     Rexene Alberts

## 2015-03-25 ENCOUNTER — Inpatient Hospital Stay (HOSPITAL_COMMUNITY): Payer: Commercial Managed Care - HMO

## 2015-03-25 ENCOUNTER — Encounter (HOSPITAL_COMMUNITY): Payer: Self-pay | Admitting: Cardiovascular Disease

## 2015-03-25 ENCOUNTER — Ambulatory Visit (HOSPITAL_COMMUNITY): Payer: Commercial Managed Care - HMO | Attending: Cardiovascular Disease

## 2015-03-25 DIAGNOSIS — Z954 Presence of other heart-valve replacement: Secondary | ICD-10-CM

## 2015-03-25 DIAGNOSIS — I509 Heart failure, unspecified: Secondary | ICD-10-CM | POA: Insufficient documentation

## 2015-03-25 DIAGNOSIS — E785 Hyperlipidemia, unspecified: Secondary | ICD-10-CM | POA: Insufficient documentation

## 2015-03-25 DIAGNOSIS — I350 Nonrheumatic aortic (valve) stenosis: Secondary | ICD-10-CM | POA: Diagnosis not present

## 2015-03-25 DIAGNOSIS — I340 Nonrheumatic mitral (valve) insufficiency: Secondary | ICD-10-CM | POA: Insufficient documentation

## 2015-03-25 DIAGNOSIS — I10 Essential (primary) hypertension: Secondary | ICD-10-CM | POA: Insufficient documentation

## 2015-03-25 DIAGNOSIS — E119 Type 2 diabetes mellitus without complications: Secondary | ICD-10-CM | POA: Insufficient documentation

## 2015-03-25 DIAGNOSIS — I359 Nonrheumatic aortic valve disorder, unspecified: Secondary | ICD-10-CM

## 2015-03-25 LAB — BASIC METABOLIC PANEL
Anion gap: 10 (ref 5–15)
BUN: 20 mg/dL (ref 6–20)
CO2: 24 mmol/L (ref 22–32)
Calcium: 9.3 mg/dL (ref 8.9–10.3)
Chloride: 105 mmol/L (ref 101–111)
Creatinine, Ser: 1.5 mg/dL — ABNORMAL HIGH (ref 0.44–1.00)
GFR calc Af Amer: 40 mL/min — ABNORMAL LOW (ref >60)
GFR calc non Af Amer: 34 mL/min — ABNORMAL LOW (ref >60)
Glucose, Bld: 106 mg/dL — ABNORMAL HIGH (ref 65–99)
Potassium: 3.6 mmol/L (ref 3.5–5.1)
Sodium: 139 mmol/L (ref 135–145)

## 2015-03-25 LAB — GLUCOSE, CAPILLARY
Glucose-Capillary: 100 mg/dL — ABNORMAL HIGH (ref 65–99)
Glucose-Capillary: 109 mg/dL — ABNORMAL HIGH (ref 65–99)
Glucose-Capillary: 112 mg/dL — ABNORMAL HIGH (ref 65–99)
Glucose-Capillary: 112 mg/dL — ABNORMAL HIGH (ref 65–99)
Glucose-Capillary: 116 mg/dL — ABNORMAL HIGH (ref 65–99)
Glucose-Capillary: 118 mg/dL — ABNORMAL HIGH (ref 65–99)
Glucose-Capillary: 126 mg/dL — ABNORMAL HIGH (ref 65–99)
Glucose-Capillary: 130 mg/dL — ABNORMAL HIGH (ref 65–99)
Glucose-Capillary: 131 mg/dL — ABNORMAL HIGH (ref 65–99)
Glucose-Capillary: 147 mg/dL — ABNORMAL HIGH (ref 65–99)
Glucose-Capillary: 152 mg/dL — ABNORMAL HIGH (ref 65–99)
Glucose-Capillary: 163 mg/dL — ABNORMAL HIGH (ref 65–99)
Glucose-Capillary: 170 mg/dL — ABNORMAL HIGH (ref 65–99)
Glucose-Capillary: 91 mg/dL (ref 65–99)
Glucose-Capillary: 94 mg/dL (ref 65–99)
Glucose-Capillary: 99 mg/dL (ref 65–99)

## 2015-03-25 LAB — CBC
HCT: 33 % — ABNORMAL LOW (ref 36.0–46.0)
Hemoglobin: 10.8 g/dL — ABNORMAL LOW (ref 12.0–15.0)
MCH: 26.3 pg (ref 26.0–34.0)
MCHC: 32.7 g/dL (ref 30.0–36.0)
MCV: 80.3 fL (ref 78.0–100.0)
Platelets: 113 10*3/uL — ABNORMAL LOW (ref 150–400)
RBC: 4.11 MIL/uL (ref 3.87–5.11)
RDW: 16.9 % — ABNORMAL HIGH (ref 11.5–15.5)
WBC: 6.6 10*3/uL (ref 4.0–10.5)

## 2015-03-25 LAB — MAGNESIUM: Magnesium: 1.6 mg/dL — ABNORMAL LOW (ref 1.7–2.4)

## 2015-03-25 MED ORDER — INSULIN DETEMIR 100 UNIT/ML ~~LOC~~ SOLN
20.0000 [IU] | Freq: Every day | SUBCUTANEOUS | Status: DC
  Administered 2015-03-25: 20 [IU] via SUBCUTANEOUS
  Filled 2015-03-25 (×2): qty 0.2

## 2015-03-25 MED ORDER — SODIUM CHLORIDE 0.9 % IJ SOLN: 3.0000 mL | INTRAMUSCULAR | Status: DC | PRN

## 2015-03-25 MED ORDER — SODIUM CHLORIDE 0.9 % IV SOLN: 250.0000 mL | INTRAVENOUS | Status: DC | PRN

## 2015-03-25 MED ORDER — POTASSIUM CHLORIDE 10 MEQ/50ML IV SOLN
10.0000 meq | INTRAVENOUS | Status: DC | PRN
  Administered 2015-03-25: 10 meq via INTRAVENOUS
  Filled 2015-03-25 (×2): qty 50

## 2015-03-25 MED ORDER — INSULIN ASPART 100 UNIT/ML ~~LOC~~ SOLN: 0.0000 [IU] | SUBCUTANEOUS | Status: DC

## 2015-03-25 MED ORDER — FUROSEMIDE 40 MG PO TABS
40.0000 mg | ORAL_TABLET | Freq: Every day | ORAL | Status: DC
  Administered 2015-03-25 – 2015-03-27 (×3): 40 mg via ORAL
  Filled 2015-03-25 (×4): qty 1

## 2015-03-25 MED ORDER — ASPIRIN EC 81 MG PO TBEC
81.0000 mg | DELAYED_RELEASE_TABLET | Freq: Every day | ORAL | Status: DC
  Administered 2015-03-25 – 2015-03-27 (×3): 81 mg via ORAL
  Filled 2015-03-25 (×3): qty 1

## 2015-03-25 MED ORDER — MOVING RIGHT ALONG BOOK
Freq: Once | Status: AC
  Administered 2015-03-25: 12:00:00
  Filled 2015-03-25: qty 1

## 2015-03-25 MED ORDER — SPIRONOLACTONE 25 MG PO TABS
25.0000 mg | ORAL_TABLET | Freq: Every day | ORAL | Status: DC
  Administered 2015-03-25 – 2015-03-27 (×3): 25 mg via ORAL
  Filled 2015-03-25 (×3): qty 1

## 2015-03-25 MED ORDER — CARVEDILOL 25 MG PO TABS
25.0000 mg | ORAL_TABLET | Freq: Two times a day (BID) | ORAL | Status: DC
  Administered 2015-03-25 – 2015-03-27 (×5): 25 mg via ORAL
  Filled 2015-03-25 (×7): qty 1

## 2015-03-25 MED ORDER — INSULIN ASPART 100 UNIT/ML ~~LOC~~ SOLN
0.0000 [IU] | SUBCUTANEOUS | Status: DC
  Administered 2015-03-25: 4 [IU] via SUBCUTANEOUS
  Administered 2015-03-25: 2 [IU] via SUBCUTANEOUS
  Administered 2015-03-26: 4 [IU] via SUBCUTANEOUS

## 2015-03-25 MED ORDER — CARVEDILOL 12.5 MG PO TABS
12.5000 mg | ORAL_TABLET | Freq: Two times a day (BID) | ORAL | Status: DC
Start: 2015-03-25 — End: 2015-03-25

## 2015-03-25 MED ORDER — SODIUM CHLORIDE 0.9 % IJ SOLN
3.0000 mL | Freq: Two times a day (BID) | INTRAMUSCULAR | Status: DC
  Administered 2015-03-25 – 2015-03-26 (×2): 3 mL via INTRAVENOUS

## 2015-03-25 MED ORDER — POTASSIUM CHLORIDE CRYS ER 20 MEQ PO TBCR
20.0000 meq | EXTENDED_RELEASE_TABLET | Freq: Every day | ORAL | Status: DC
  Administered 2015-03-26 – 2015-03-27 (×2): 20 meq via ORAL
  Filled 2015-03-25 (×3): qty 1

## 2015-03-25 MED FILL — Electrolyte-R (PH 7.4) Solution: INTRAVENOUS | Qty: 3000 | Status: AC

## 2015-03-25 NOTE — Progress Notes (Signed)
[id number]       [place], [id number]             [phone]        CARDIOTHORACIC SURGERY PROGRESS NOTE   R1 Day Post-Op Procedure(s) (LRB): TRANSCATHETER AORTIC VALVE REPLACEMENT, TRANSFEMORAL (Bilateral) TRANSESOPHAGEAL ECHOCARDIOGRAM (TEE) (N/A)  Subjective: Only complaint is scratchy throat.  Looks good.  Objective: Vital signs: BP Readings from Last 1 Encounters:  03/25/15 114/59   Pulse Readings from Last 1 Encounters:  03/25/15 94   Resp Readings from Last 1 Encounters:  03/25/15 16   Temp Readings from Last 1 Encounters:  03/25/15 99.1 F (37.3 C) Core (Comment)    Hemodynamics: PAP: (24-47)/(5-22) 33/18 mmHg CO:  [4.4 L/min-5.8 L/min] 4.4 L/min CI:  [2.4 L/min/m2-3.1 L/min/m2] 2.4 L/min/m2  Physical Exam:  Rhythm:   sinus  Breath sounds: clear  Heart sounds:  RRR  Incisions:  Clean and dry  Abdomen:  Soft, non-distended, non-tender  Extremities:  Warm, well-perfused    Intake/Output from previous day: 08/09 0701 - 08/10 0700 In: 2593.4 [I.V.:1693.4; IV Piggyback:900] Out: [id number] [Urine:3145; Blood:75] Intake/Output this shift: Total I/O In: 65.5 [P.O.:60; I.V.:5.5] Out: 185 [Urine:185]  Lab Results:  CBC: Recent Labs  03/24/15 1404 03/25/15 0409  WBC 4.8 6.6  HGB 9.6* 10.8*  HCT 29.9* 33.0*  PLT 107* 113*    BMET:  Recent Labs  03/24/15 1357 03/25/15 0409  NA 139 139  K 3.4* 3.6  CL  --  105  CO2  --  24  GLUCOSE 233* 106*  BUN  --  20  CREATININE  --  1.50*  CALCIUM  --  9.3     PT/INR:   Recent Labs  03/24/15 1404  LABPROT 18.1*  INR 1.50*    CBG (last 3)   Recent Labs  03/25/15 0408 03/25/15 0516 03/25/15 0614  GLUCAP 109* 100* 99    ABG    Component Value Date/Time   PHART 7.341* 03/24/2015 1356   PCO2ART 38.4 03/24/2015 1356   PO2ART 67.0* 03/24/2015 1356   HCO3 21.0 03/24/2015 1356   TCO2 22 03/24/2015 1356   ACIDBASEDEF 5.0* 03/24/2015 1356   O2SAT 93.0 03/24/2015 1356     CXR: PORTABLE CHEST - 1 VIEW  COMPARISON: Portable chest x-ray of March 24, 2015  FINDINGS: The lungs are better inflated today. The interstitial markings are less conspicuous. There remains linear density peripherally in the right mid lung that likely reflects atelectasis or scarring. The cardiac silhouette remains enlarged. The pulmonary vascularity is less prominent. The Swan-Ganz catheter tip is coiled in what appears to be the proximal left main pulmonary artery. The mediastinum is normal in width. The bony thorax exhibits no acute abnormality.  IMPRESSION: Mild improvement in the appearance of the pulmonary interstitium consistent with resolving interstitial edema. Bibasilar atelectasis has resolved. There remains cardiomegaly. Correlation clinically as to the positioning of the Swan-Ganz catheter is needed.   Electronically Signed  By: David Martinique M.D.  On: 03/25/2015 07:48   EKG: NSR w/ LBBB - unchanged from pre-op     Assessment/Plan: S/P Procedure(s) (LRB): TRANSCATHETER AORTIC VALVE REPLACEMENT, TRANSFEMORAL (Bilateral) TRANSESOPHAGEAL ECHOCARDIOGRAM (TEE) (N/A)  Doing well POD1 TAVR Maintaining NSR w/ stable hemodynamics on low dose milrinone Chronic non-ischemic cardiomyopathy Chronic combined systolic and diastolic CHF Chronic LBBB Type II diabetes mellitus, excellent glycemic control CKD w/ stable renal function   Mobilize  Wean milrinone off  Restart Carvedilol  ASA and Plavix  Restart Lasix and Spironolactone  Watch renal function  Routine POD1 ECHO  Possible transfer step down later today  I spent in excess of 15 minutes during the conduct of this hospital encounter and >50% of this time involved direct face-to-face encounter with the patient for counseling and/or coordination of their care.   Rexene Alberts 03/25/2015 9:02 AM

## 2015-03-25 NOTE — Anesthesia Preprocedure Evaluation (Signed)
Anesthesia Evaluation  Patient identified by MRN, date of birth, ID band Patient awake    Reviewed: Allergy & Precautions, NPO status , Patient's Chart, lab work & pertinent test results  History of Anesthesia Complications Negative for: history of anesthetic complications  Airway Mallampati: II  TM Distance: >3 FB Neck ROM: Full    Dental no notable dental hx.    Pulmonary neg pulmonary ROS,  breath sounds clear to auscultation        Cardiovascular hypertension, Pt. on medications +CHF + dysrhythmias + Valvular Problems/Murmurs AS Rhythm:Regular + Systolic murmurs    Neuro/Psych PSYCHIATRIC DISORDERS Anxiety Depression negative neurological ROS     GI/Hepatic negative GI ROS, Neg liver ROS,   Endo/Other  diabetes, Type 2, Oral Hypoglycemic Agents  Renal/GU Renal InsufficiencyRenal disease     Musculoskeletal  (+) Arthritis -,   Abdominal   Peds  Hematology   Anesthesia Other Findings   Reproductive/Obstetrics                             Anesthesia Physical Anesthesia Plan  ASA: IV  Anesthesia Plan: General   Post-op Pain Management:    Induction: Intravenous  Airway Management Planned: Oral ETT  Additional Equipment: Arterial line, TEE, CVP, PA Cath and Ultrasound Guidance Line Placement  Intra-op Plan:   Post-operative Plan: Extubation in OR and Possible Post-op intubation/ventilation  Informed Consent: I have reviewed the patients History and Physical, chart, labs and discussed the procedure including the risks, benefits and alternatives for the proposed anesthesia with the patient or authorized representative who has indicated his/her understanding and acceptance.   Dental advisory given  Plan Discussed with: CRNA and Surgeon  Anesthesia Plan Comments:         Anesthesia Quick Evaluation

## 2015-03-25 NOTE — Progress Notes (Signed)
     SUBJECTIVE: No chest pain or SOB.   BP 129/60 mmHg  Pulse 89  Temp(Src) 99 F (37.2 C) (Core (Comment))  Resp 25  Ht 5\' 3"  (1.6 m)  Wt 182 lb 1.6 oz (82.6 kg)  BMI 32.27 kg/m2  SpO2 98%  Intake/Output Summary (Last 24 hours) at 03/25/15 0724 Last data filed at 03/25/15 0636  Gross per 24 hour  Intake 2587.89 ml  Output   3220 ml  Net -632.11 ml    PHYSICAL EXAM General: Well developed, well nourished, in no acute distress. Alert and oriented x 3.  Psych:  Good affect, responds appropriately Neck: No JVD. No masses noted.  Lungs: Clear bilaterally with no wheezes or rhonci noted.  Heart: RRR with systolic murmur.  Abdomen: Bowel sounds are present. Soft, non-tender.  Extremities: No lower extremity edema.   LABS: Basic Metabolic Panel:  Recent Labs  03/24/15 1357 03/25/15 0409  NA 139 139  K 3.4* 3.6  CL  --  105  CO2  --  24  GLUCOSE 233* 106*  BUN  --  20  CREATININE  --  1.50*  CALCIUM  --  9.3  MG  --  1.6*   CBC:  Recent Labs  03/24/15 1404 03/25/15 0409  WBC 4.8 6.6  HGB 9.6* 10.8*  HCT 29.9* 33.0*  MCV 80.8 80.3  PLT 107* 113*    Current Meds: . acetaminophen  1,000 mg Oral 4 times per day   Or  . acetaminophen (TYLENOL) oral liquid 160 mg/5 mL  1,000 mg Per Tube 4 times per day  . acetaminophen (TYLENOL) oral liquid 160 mg/5 mL  650 mg Per Tube Once   Or  . acetaminophen  650 mg Rectal Once  . antiseptic oral rinse  7 mL Mouth Rinse BID  . aspirin EC  325 mg Oral Daily   Or  . aspirin  324 mg Per Tube Daily  . clopidogrel  75 mg Oral Q breakfast  . glipiZIDE  5 mg Oral Daily  . insulin regular  0-10 Units Intravenous TID WC  . levofloxacin (LEVAQUIN) IV  750 mg Intravenous Q24H  . metoprolol tartrate  12.5 mg Oral BID  . [START ON 03/26/2015] pantoprazole  40 mg Oral Daily  . rosuvastatin  40 mg Oral Daily     ASSESSMENT AND PLAN:  1. Severe aortic valve stenosis: POD #1 s/p TAVR. She is doing well. She is on ASA and  Plavix. Renal function is stable. Wean milrinone off today. Echo later to day to assess valve. Will follow BP off of milrinone. Her home dose of Coreg is 25 mg BID. Post procedure she was started on Lopressor 12.5 BID. D/C lines and foley today. Ambulate  2. DM: She will need to be restarted on home insulin dose       Gailya Tauer  8/10/20167:24 AM

## 2015-03-25 NOTE — Anesthesia Postprocedure Evaluation (Signed)
  Anesthesia Post-op Note  Patient: [name] [name]  Procedure(s) Performed: Procedure(s): TRANSCATHETER AORTIC VALVE REPLACEMENT, TRANSFEMORAL (Bilateral) TRANSESOPHAGEAL ECHOCARDIOGRAM (TEE) (N/A)  Patient Location: PACU  Anesthesia Type:General  Level of Consciousness: awake  Airway and Oxygen Therapy: Patient Spontanous Breathing and Patient connected to nasal cannula oxygen  Post-op Pain: mild  Post-op Assessment: Post-op Vital signs reviewed, Patient's Cardiovascular Status Stable, Respiratory Function Stable, Patent Airway, No signs of Nausea or vomiting and Pain level controlled              Post-op Vital Signs: Reviewed and stable  Last Vitals:  Filed Vitals:   03/25/15 1200  BP: 116/66  Pulse: 81  Temp:   Resp: 18    Complications: No apparent anesthesia complications

## 2015-03-25 NOTE — Progress Notes (Signed)
Called for 4th time to dietary to make sure pt's tray is on way.

## 2015-03-25 NOTE — Progress Notes (Signed)
  Echocardiogram 2D Echocardiogram has been performed.  [name] [name] M 03/25/2015, 11:24 AM

## 2015-03-26 ENCOUNTER — Inpatient Hospital Stay (HOSPITAL_COMMUNITY): Payer: Commercial Managed Care - HMO

## 2015-03-26 LAB — CBC
HCT: 34.2 % — ABNORMAL LOW (ref 36.0–46.0)
Hemoglobin: 11 g/dL — ABNORMAL LOW (ref 12.0–15.0)
MCH: 26.1 pg (ref 26.0–34.0)
MCHC: 32.2 g/dL (ref 30.0–36.0)
MCV: 81.2 fL (ref 78.0–100.0)
Platelets: 89 10*3/uL — ABNORMAL LOW (ref 150–400)
RBC: 4.21 MIL/uL (ref 3.87–5.11)
RDW: 16.9 % — ABNORMAL HIGH (ref 11.5–15.5)
WBC: 6.9 10*3/uL (ref 4.0–10.5)

## 2015-03-26 LAB — BASIC METABOLIC PANEL
Anion gap: 9 (ref 5–15)
BUN: 20 mg/dL (ref 6–20)
CO2: 26 mmol/L (ref 22–32)
Calcium: 9.5 mg/dL (ref 8.9–10.3)
Chloride: 104 mmol/L (ref 101–111)
Creatinine, Ser: 1.74 mg/dL — ABNORMAL HIGH (ref 0.44–1.00)
GFR calc Af Amer: 33 mL/min — ABNORMAL LOW (ref >60)
GFR calc non Af Amer: 29 mL/min — ABNORMAL LOW (ref >60)
Glucose, Bld: 111 mg/dL — ABNORMAL HIGH (ref 65–99)
Potassium: 4 mmol/L (ref 3.5–5.1)
Sodium: 139 mmol/L (ref 135–145)

## 2015-03-26 LAB — TYPE AND SCREEN
ABO/RH(D): A POS
Antibody Screen: NEGATIVE
Unit division: 0
Unit division: 0

## 2015-03-26 LAB — GLUCOSE, CAPILLARY
Glucose-Capillary: 100 mg/dL — ABNORMAL HIGH (ref 65–99)
Glucose-Capillary: 171 mg/dL — ABNORMAL HIGH (ref 65–99)
Glucose-Capillary: 220 mg/dL — ABNORMAL HIGH (ref 65–99)
Glucose-Capillary: 130 mg/dL — ABNORMAL HIGH (ref 65–99)
Glucose-Capillary: 123 mg/dL — ABNORMAL HIGH (ref 65–99)

## 2015-03-26 MED ORDER — INSULIN DETEMIR 100 UNIT/ML ~~LOC~~ SOLN
20.0000 [IU] | Freq: Two times a day (BID) | SUBCUTANEOUS | Status: DC
  Administered 2015-03-26: 20 [IU] via SUBCUTANEOUS
  Filled 2015-03-26 (×3): qty 0.2

## 2015-03-26 MED ORDER — INSULIN ASPART 100 UNIT/ML ~~LOC~~ SOLN
0.0000 [IU] | Freq: Three times a day (TID) | SUBCUTANEOUS | Status: DC
  Administered 2015-03-26: 5 [IU] via SUBCUTANEOUS
  Administered 2015-03-26: 2 [IU] via SUBCUTANEOUS

## 2015-03-26 MED ORDER — INSULIN DETEMIR 100 UNIT/ML ~~LOC~~ SOLN
20.0000 [IU] | Freq: Every day | SUBCUTANEOUS | Status: DC
  Administered 2015-03-27: 20 [IU] via SUBCUTANEOUS
  Filled 2015-03-26: qty 0.2

## 2015-03-26 MED ORDER — GLIPIZIDE ER 5 MG PO TB24
5.0000 mg | ORAL_TABLET | Freq: Every day | ORAL | Status: DC
  Administered 2015-03-26 – 2015-03-27 (×2): 5 mg via ORAL
  Filled 2015-03-26 (×3): qty 1

## 2015-03-26 NOTE — Discharge Summary (Signed)
Physician Discharge Summary  Patient ID: [name] [name] MRN: [id number] DOB/AGE: [birth year] 69 y.o.  Admit date: 03/24/2015 Discharge date: 03/26/2015  Admission Diagnoses: severe Aortic Stenosis  Discharge Diagnoses:  Principal Problem:   S/P TAVR (transcatheter aortic valve replacement) Active Problems:   Diabetes mellitus, insulin dependent (IDDM), controlled   Obesity   Essential hypertension, benign   Aortic stenosis   Nonischemic cardiomyopathy   Chronic systolic heart failure   CKD (chronic kidney disease) stage 3, GFR 30-59 ml/min   LBBB (left bundle branch block)   Chronic combined systolic and diastolic CHF (congestive heart failure)   Severe aortic stenosis   Severe aortic valve stenosis  Patient Active Problem List   Diagnosis Date Noted  . 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 Severe aortic valve stenosis 03/24/2015  . Unresolved grief 03/14/2015  . Knee pain, left 12/16/2014  . Hyperlipidemia LDL goal <100 05/24/2014  . Severe aortic stenosis 05/23/2014  . Incidental pulmonary nodule, > 42mm and < 15mm 02/10/2014  . Chronic combined systolic and diastolic CHF (congestive heart failure)   . LBBB (left bundle branch block) 12/03/2013  . CKD (chronic kidney disease) stage 3, GFR 30-59 ml/min 05/21/2013  . Chronic systolic heart failure 0000000  . Seasonal allergies 02/22/2012  . Essential hypertension, benign   . Aortic stenosis   . Nonischemic cardiomyopathy 10/14/2011  . Insomnia 03/22/2011  . Diabetes mellitus, insulin dependent (IDDM), controlled 10/24/2007  . Obesity 10/24/2007    HPI: at time of admission  Patient is a 69 year old African-American female with known history of low flow low gradient severe aortic stenosis, chronic combined systolic and diastolic congestive heart failure, hypertension, hyperlipidemia, type 2 diabetes mellitus with complication, and stage III chronic kidney disease returns to the office today for  follow-up of severe aortic stenosis. She was originally seen in consultation on 01/29/2014. Following her initial consultation she was referred for dental service evaluation and underwent teeth extraction. She underwent diagnostic cardiac catheterization by Dr. [name] on 05/23/2014 revealing normal coronary artery anatomy with no significant coronary artery disease. She had moderate pulmonary hypertension. She was found to have a foreign body lodged in the right middle lobe bronchus with chronic right middle lobe collapse as an incidental finding on cardiac gated CT angiogram of the heart. She was referred for pulmonary consultation and underwent flexible fiberoptic bronchoscopy by Dr. [name], but he was unable to remove the foreign body. She eventually underwent rigid bronchoscopy for removal of the foreign body by Dr. [name] on 01/14/2015. The extended period of time that has elapsed since her original evaluation last June and has been largely because the patient spent much of her time at home with her elderly husband who eventually passed away earlier this year. She now returns with hope to proceed with definitive management of her aortic stenosis in the near future. She has been seen recently in follow-up by Dr. [name], and repeat transthoracic echocardiogram confirms the presence of severe left ventricular systolic dysfunction with ejection fraction estimated 10-15%. The patient has severe low flow low gradient aortic stenosis with peak velocity across the aortic valve measured 3.5 m/s corresponding to mean transvalvular gradient of 26 mmHg. Previous dobutamine stress echocardiography performed 12/23/2013 confirmed the presence of severe aortic stenosis.  The patient is recently widowed and lives with one of her granddaughters in [place]. She has remained reasonably active and functional independent for all of her life. She describes stable symptoms of exertional shortness of breath and  fatigue consistent  with chronic combined systolic and diastolic congestive heart failure, New [place] Heart Association functional class IIB-III. Symptoms seem to wax and wane a bit in severity. The patient occasionally experiences dyspnea while trying to sleep at night. However, she denies any history of orthopnea, dizzy spells, or syncope. She has never had any chest pain or chest tightness with exertion, although she states that she occasionally gets fleeting feelings of tightness across her chest that seemed to be unrelated to activity. Her mobility remains reasonably good, although she still has degenerative arthritis that limits her walking to some degree, particularly in her left knee.         The patient was admitted electively for the TAVR procedure  Discharged Condition: good  Hospital Course: The patient was admitted electively for to have her procedure which she tolerated quite well.. She initially required some low-dose hemodynamic support, specifically milrinone and neo which was weaned without difficulty. She has maintained sinus rhythm and has a chronic left bundle branch block. She has been started on aspirin and Plavix. She was restarted on her Coreg. Renal function has been relatively stable with current creatinine 1.7. Postop echocardiogram looks good with normal bowel function and and only mild paravalvular leak with stable LV function. Her diabetes has been under good control. Incisions are doing fine without evidence of hematoma or infection. She is tolerating gradually increasing activity using standard protocols. Oxygen has been weaned and maintained good saturations on room air. She is otherwise hemodynamically stable. She has a mild acute blood loss anemia which is stable. Overall she is felt to be stable for discharge on today's date.  Consults: cardiology  Significant Diagnostic Studies: routine post op including echo  Treatments: surgery  CARDIOTHORACIC SURGERY  OPERATIVE NOTE  Date of Procedure:03/24/2015  Preoperative Diagnosis:Severe Aortic Stenosis   Postoperative Diagnosis:Same   Procedure:   Transcatheter Aortic Valve Replacement - Open Right Transfemoral Approach Edwards Sapien 3 Transcatheter Heart Valve (size 23 mm, model # 9600TFX, serial # [id number])  Co-Surgeons:Clarence H. Roxy Manns, MD and Lauree Chandler, MD  Assistants:Bryan Alveria Apley, MD   Anesthesiologist:Chris Ermalene Postin, MD  Dala Dock, MD  Pre-operative Echo Findings:  Severe aortic stenosis  Severe left ventricular systolic dysfunction  Post-operative Echo Findings:  Trace paravalvular leak Unchanged left ventricular systolic function  Discharge Exam: Blood pressure 117/60, pulse 88, temperature 99.3 F (37.4 C), temperature source Oral, resp. rate 15, height 5\' 3"  (1.6 m), weight 183 lb 13.8 oz (83.4 kg), SpO2 99 %.   General appearance: alert, cooperative and no distress Heart: regular rate and rhythm Lungs: clear to auscultation bilaterally Abdomen: soft, nontender Extremities: no edema Wound: dressings CDI Medications at discharge:   Medication List    TAKE these medications        acetaminophen 500 MG tablet  Commonly known as:  TYLENOL  Take 1,000 mg by mouth every 6 (six) hours as needed for moderate pain.     aspirin EC 81 MG tablet  Take 81 mg by mouth daily.     carvedilol 25 MG tablet  Commonly known as:  COREG  Take 25 mg by mouth 2 (two) times daily with a meal.     clopidogrel 75 MG tablet  Commonly known as:  PLAVIX  Take 1 tablet (75 mg total) by mouth daily with breakfast.     CRESTOR 40 MG tablet  Generic drug:  rosuvastatin  Take 40 mg by mouth daily.     EASYMAX TEST test strip  Generic drug:  glucose blood  1 each by Other route See admin instructions. Check  blood sugar twice daily     fenofibrate 145 MG tablet  Commonly known as:  TRICOR  Take 145 mg by mouth daily.     furosemide 40 MG tablet  Commonly known as:  LASIX  Take 40 mg by mouth daily.     glipiZIDE 5 MG 24 hr tablet  Commonly known as:  GLUCOTROL XL  Take 5 mg by mouth daily.     HUMALOG MIX 50/50 KWIKPEN (50-50) 100 UNIT/ML Kwikpen  Generic drug:  Insulin Lispro Prot & Lispro  Inject 12 Units into the skin 2 (two) times daily.     MULTIVITAMIN PO  Take 1 tablet by mouth daily.     niacin 1000 MG CR tablet  Commonly known as:  NIASPAN  Take 1,000 mg by mouth at bedtime.     potassium chloride 10 MEQ tablet  Commonly known as:  K-DUR,KLOR-CON  Take 1 tablet (10 mEq total) by mouth daily.     spironolactone 25 MG tablet  Commonly known as:  ALDACTONE  Take 25 mg by mouth daily.     traMADol 50 MG tablet  Commonly known as:  ULTRAM  Take 1-2 tablets (50-100 mg total) by mouth every 6 (six) hours as needed for moderate pain.     UNIFINE PENTIPS 31G X 8 MM Misc  Generic drug:  Insulin Pen Needle  1 each by Other route See admin instructions. Use as directed with insulin twice daily        Disposition: 01-Home or Self Care      Follow-up Information    Follow up with Rexene Alberts, MD.   Specialty:  Cardiothoracic Surgery   Why:  Mon Apr 06, 2015 at 2 pm- surgeon appt   Contact information:   [place] [address] [phone]       Follow up with Lauree Chandler, MD.   Specialty:  Cardiology   Why:  1 month appt with cardiologist with echocardiogram- office will contact you   Contact information:   [address] [place] [address] [phone]       Signed: John Giovanni 03/26/2015, 9:14 AM

## 2015-03-26 NOTE — Progress Notes (Signed)
     SUBJECTIVE: No chest pain or SOB  BP 117/60 mmHg  Pulse 88  Temp(Src) 99.1 F (37.3 C) (Oral)  Resp 15  Ht 5\' 3"  (1.6 m)  Wt 183 lb 13.8 oz (83.4 kg)  BMI 32.58 kg/m2  SpO2 99%  Intake/Output Summary (Last 24 hours) at 03/26/15 0717 Last data filed at 03/26/15 0600  Gross per 24 hour  Intake 1195.97 ml  Output   3150 ml  Net -1954.03 ml    PHYSICAL EXAM General: Well developed, well nourished, in no acute distress. Alert and oriented x 3.  Psych:  Good affect, responds appropriately Neck: No JVD. No masses noted.  Lungs: Clear bilaterally with no wheezes or rhonci noted.  Heart: RRR with no murmurs noted. Abdomen: Bowel sounds are present. Soft, non-tender.  Extremities: No lower extremity edema.   LABS: Basic Metabolic Panel:  Recent Labs  03/25/15 0409 03/26/15 0319  NA 139 139  K 3.6 4.0  CL 105 104  CO2 24 26  GLUCOSE 106* 111*  BUN 20 20  CREATININE 1.50* 1.74*  CALCIUM 9.3 9.5  MG 1.6*  --    CBC:  Recent Labs  03/25/15 0409 03/26/15 0319  WBC 6.6 6.9  HGB 10.8* 11.0*  HCT 33.0* 34.2*  MCV 80.3 81.2  PLT 113* 89*    Current Meds: . acetaminophen  1,000 mg Oral 4 times per day  . antiseptic oral rinse  7 mL Mouth Rinse BID  . aspirin EC  81 mg Oral Daily  . carvedilol  25 mg Oral BID WC  . clopidogrel  75 mg Oral Q breakfast  . furosemide  40 mg Oral Daily  . glipiZIDE  5 mg Oral Daily  . insulin aspart  0-24 Units Subcutaneous 6 times per day  . insulin detemir  20 Units Subcutaneous Daily  . pantoprazole  40 mg Oral Daily  . potassium chloride  20 mEq Oral Daily  . rosuvastatin  40 mg Oral Daily  . sodium chloride  3 mL Intravenous Q12H  . spironolactone  25 mg Oral Daily   Echo 03/25/15: Left ventricle: The cavity size was mildly dilated. Wall thickness was normal. Systolic function was severely reduced. The estimated ejection fraction was in the range of 15% to 20%. Severe diffuse hypokinesis with no identifiable  regional variations. Doppler parameters are consistent with abnormal left ventricular relaxation (grade 1 diastolic dysfunction). - Ventricular septum: Septal motion showed abnormal function, dyssynergy, and paradox. These changes are consistent with a left bundle branch block. - Aortic valve: A bioprosthesis (stent valve) was present and functioning normally. There was mild perivalvular regurgitation, at the aspect of the annulus bordering the intervalvular fibrosa. The jet is eccentric, along the anterior mitral leaflet. Valve area (VTI): 1.46 cm^2. Valve area (Vmax): 1.31 cm^2. Valve area (Vmean): 1.49 cm^2. - Mitral valve: Calcified annulus. There was mild regurgitation. - Left atrium: The atrium was mildly dilated.   ASSESSMENT AND PLAN:  1. Severe aortic valve stenosis: POD #2 s/p TAVR. She is doing well. She is on ASA and Plavix. Renal function is stable. She is back on Coreg, Lasix and aldactone. Echo 03/25/15 with mild perivalvular leak.   2. DM: Back on home insulin  3. Non-ischemic cardiomyopathy: Volume status ok. Continue current meds.   4. Chronic kidney disease: Overall stable post procedure. Will recheck BMET in am  [name] [name]  8/11/20167:17 AM

## 2015-03-26 NOTE — Evaluation (Signed)
Physical Therapy Evaluation and Discharge Patient Details Name: [name] [name] MRN: [id number] DOB: 1946/07/27 Today's Date: 03/26/2015   History of Present Illness  Adm 8/9 for TAVR   Clinical Impression  Patient evaluated by Physical Therapy with no further acute PT needs identified. Patient has multiple family members available to assist, if needed. She was independent with all mobility and demonstrated balance WNL. All education has been completed and the patient has no further questions. PT is signing off. Thank you for this referral.     Follow Up Recommendations No PT follow up    Equipment Recommendations  None recommended by PT    Recommendations for Other Services       Precautions / Restrictions Precautions Precautions: None      Mobility  Bed Mobility                  Transfers Overall transfer level: Independent Equipment used: None                Ambulation/Gait Ambulation/Gait assistance: Independent Ambulation Distance (Feet): 400 Feet Assistive device: None Gait Pattern/deviations: Antalgic (due to Lt knee arthritis (as PTA per pt))   Gait velocity interpretation: at or above normal speed for age/gender    Stairs            Wheelchair Mobility    Modified Rankin (Stroke Patients Only)       Balance Overall balance assessment: Independent (Romberg eyes closed 30 sec; )                                           Pertinent Vitals/Pain Pain Assessment: No/denies pain    Home Living Family/patient expects to be discharged to:: Private residence Living Arrangements: Other relatives (grandchildren (63 yo, 62 yo); son lives behing) Available Help at Discharge: Family;Available PRN/intermittently (grandaughter works nights) Type of Home: House Home Access: Stairs to enter Entrance Stairs-Rails: Right;Left;Can reach both Technical brewer of Steps: 5 Home Layout: One level Home Equipment: Cane -  single point;Walker - 2 wheels      Prior Function Level of Independence: Independent         Comments: DME was her husbands (died in 2022/11/17)     Hand Dominance        Extremity/Trunk Assessment   Upper Extremity Assessment: Overall WFL for tasks assessed           Lower Extremity Assessment: Overall WFL for tasks assessed      Cervical / Trunk Assessment: Normal  Communication   Communication: No difficulties  Cognition Arousal/Alertness: Awake/alert Behavior During Therapy: WFL for tasks assessed/performed Overall Cognitive Status: Within Functional Limits for tasks assessed                      General Comments      Exercises        Assessment/Plan    PT Assessment Patent does not need any further PT services  PT Diagnosis Difficulty walking   PT Problem List    PT Treatment Interventions     PT Goals (Current goals can be found in the Care Plan section) Acute Rehab PT Goals Patient Stated Goal: go home with family support PT Goal Formulation: All assessment and education complete, DC therapy    Frequency     Barriers to discharge        Co-evaluation  End of Session   Activity Tolerance: Patient tolerated treatment well Patient left: in chair;with call bell/phone within reach;with nursing/sitter in room Nurse Communication: Mobility status (no PT or DME needs)         Time: [id number] PT Time Calculation (min) (ACUTE ONLY): 24 min   Charges:   PT Evaluation $Initial PT Evaluation Tier I: 1 Procedure PT Treatments $Gait Training: 8-22 mins   PT G Codes:        Rokia Bosket 2015/04/01, 11:34 AM  Pager [phone]

## 2015-03-26 NOTE — Progress Notes (Signed)
Inpatient Diabetes Program Recommendations  AACE/ADA: New Consensus Statement on Inpatient Glycemic Control (2013)  Target Ranges:  Prepandial:   less than 140 mg/dL      Peak postprandial:   less than 180 mg/dL (1-2 hours)      Critically ill patients:  140 - 180 mg/dL   Results for MERRI, KREUTER (MRN [id number]) as of 03/26/2015 14:24  Ref. Range 03/25/2015 15:56 03/25/2015 19:28 03/25/2015 23:25 03/26/2015 03:43 03/26/2015 11:22  Glucose-Capillary Latest Ref Range: 65-99 mg/dL 163 (H) 94 91 100 (H) 220 (H)    Diabetes history: Diabetes Mellitus, insulin dependent Outpatient Diabetes medications: Humalog 50/50 mix 12 units bid Current orders for Inpatient glycemic control:  Levemir 20 units bid, Novolog moderate tid with meals  Please consider reducing Levemir to 20 units daily and add low dose Novolog meal coverage 3 units tid with meals (to be held if patient eats less than 50%).  Note that fasting CBG in early AM was 100 mg/dL.    Thanks, Adah Perl, RN, BC-ADM Inpatient Diabetes Coordinator Pager [phone] (8a-5p)

## 2015-03-26 NOTE — Progress Notes (Addendum)
[address]       [place] [address]             [phone]        CARDIOTHORACIC SURGERY PROGRESS NOTE   R2 Days Post-Op Procedure(s) (LRB): TRANSCATHETER AORTIC VALVE REPLACEMENT, TRANSFEMORAL (Bilateral) TRANSESOPHAGEAL ECHOCARDIOGRAM (TEE) (N/A)  Subjective: Looks good.  Wants to go home.  Ambulating w/ minimal assistance.  Objective: Vital signs: BP Readings from Last 1 Encounters:  03/26/15 117/60   Pulse Readings from Last 1 Encounters:  03/26/15 88   Resp Readings from Last 1 Encounters:  03/26/15 15   Temp Readings from Last 1 Encounters:  03/26/15 99.1 F (37.3 C) Oral    Hemodynamics: PAP: (29-33)/(15-18) 29/15 mmHg CO:  [4.4 L/min] 4.4 L/min CI:  [2.4 L/min/m2] 2.4 L/min/m2  Physical Exam:  Rhythm:   sinus  Breath sounds: clear  Heart sounds:  RRR  Incisions:  Clean and dry  Abdomen:  Soft, non-distended, non-tender  Extremities:  Warm, well-perfused    Intake/Output from previous day: 08/10 0701 - 08/11 0700 In: 1196 [P.O.:1180; I.V.:16] Out: 3150 [Urine:3150] Intake/Output this shift:    Lab Results:  CBC: Recent Labs  03/25/15 0409 03/26/15 0319  WBC 6.6 6.9  HGB 10.8* 11.0*  HCT 33.0* 34.2*  PLT 113* 89*    BMET:  Recent Labs  03/25/15 0409 03/26/15 0319  NA 139 139  K 3.6 4.0  CL 105 104  CO2 24 26  GLUCOSE 106* 111*  BUN 20 20  CREATININE 1.50* 1.74*  CALCIUM 9.3 9.5     PT/INR:   Recent Labs  03/24/15 1404  LABPROT 18.1*  INR 1.50*    CBG (last 3)   Recent Labs  03/25/15 1928 03/25/15 2325 03/26/15 0343  GLUCAP 94 91 100*    ABG    Component Value Date/Time   PHART 7.341* 03/24/2015 1356   PCO2ART 38.4 03/24/2015 1356   PO2ART 67.0* 03/24/2015 1356   HCO3 21.0 03/24/2015 1356   TCO2 22 03/24/2015 1356   ACIDBASEDEF 5.0* 03/24/2015 1356   O2SAT 93.0 03/24/2015 1356    CXR: CHEST 2 VIEW  COMPARISON: Portable chest x-ray of March 25, 2015 and March 24, 2015  FINDINGS: The lungs are slightly less well inflated today. Subsegmental atelectasis is more conspicuous bilaterally. There is no pleural effusion. The cardiac silhouette is enlarged. The pulmonary vascularity is not engorged. There has been interval removal of the Swan-Ganz catheter and right internal jugular Cordis sheath. The bony thorax exhibits no acute abnormality. On the lateral film the aortic valve cage is visible.  IMPRESSION: There is persistent mild bibasilar atelectasis slightly more conspicuous than on yesterday's study. There is no CHF, pneumonia, nor other acute cardiopulmonary abnormality.   Electronically Signed  By: David Martinique M.D.  On: 03/26/2015 07:35    Transthoracic Echocardiography  Patient:  Nelle, Moustafa MR #:    [id number] Study Date: 03/25/2015 Gender:   F Age:    69 Height:   160 cm Weight:   82.6 kg BSA:    1.95 m^2 Pt. Status: Room:    2S12C  ATTENDING  Darylene Price, M.D. ADMITTING  [place], [place], Inpatient SONOGRAPHER Darlina Sicilian, RDCS  cc:  ------------------------------------------------------------------- LV EF: 15% -  20%  ------------------------------------------------------------------- Indications:   Aortic stenosis 424.1.  ------------------------------------------------------------------- History:  PMH: LBBB. Congestive heart failure. Risk factors: Hypertension. Diabetes mellitus. Dyslipidemia.  -------------------------------------------------------------------  Study Conclusions  - Left ventricle: The cavity size was mildly dilated. Wall thickness was normal. Systolic function was severely reduced. The estimated ejection fraction was in the range of 15% to 20%. Severe diffuse hypokinesis with no identifiable regional variations. Doppler parameters  are consistent with abnormal left ventricular relaxation (grade 1 diastolic dysfunction). - Ventricular septum: Septal motion showed abnormal function, dyssynergy, and paradox. These changes are consistent with a left bundle branch block. - Aortic valve: A bioprosthesis (stent valve) was present and functioning normally. There was mild perivalvular regurgitation, at the aspect of the annulus bordering the intervalvular fibrosa. The jet is eccentric, along the anterior mitral leaflet. Valve area (VTI): 1.46 cm^2. Valve area (Vmax): 1.31 cm^2. Valve area (Vmean): 1.49 cm^2. - Mitral valve: Calcified annulus. There was mild regurgitation. - Left atrium: The atrium was mildly dilated.  Transthoracic echocardiography. M-mode, complete 2D, spectral Doppler, and color Doppler. Birthdate: Patient birthdate: [birth year]. Age: Patient is 69 yr old. Sex: Gender: female. BMI: 32.2 kg/m^2. Blood pressure:   114/59 Patient status: Inpatient. Study date: Study date: 03/25/2015. Study time: 10:41 AM. Location: ICU/CCU  -------------------------------------------------------------------  ------------------------------------------------------------------- Left ventricle: The cavity size was mildly dilated. Wall thickness was normal. Adverse spherical remodeling is present. Systolic function was severely reduced. The estimated ejection fraction was in the range of 15% to 20%. Severe diffuse hypokinesis with no identifiable regional variations. There is profound systolic dyssynchrony. Doppler parameters are consistent with abnormal left ventricular relaxation (grade 1 diastolic dysfunction). There was no evidence of elevated ventricular filling pressure by Doppler parameters.  ------------------------------------------------------------------- Aortic valve: A bioprosthesis (stent valve) was present and functioning normally. Doppler: Transvalvular velocity  was increased less than expected, due to low cardiac output. There was mild perivalvular regurgitation, at the aspect of the annulus bordering the intervalvular fibrosa. The jet is eccentric, along the anterior mitral leaflet.  VTI ratio of LVOT to aortic valve: 0.64. Valve area (VTI): 1.46 cm^2. Indexed valve area (VTI): 0.75 cm^2/m^2. Peak velocity ratio of LVOT to aortic valve: 0.58. Valve area (Vmax): 1.31 cm^2. Indexed valve area (Vmax): 0.67 cm^2/m^2. Mean velocity ratio of LVOT to aortic valve: 0.65. Valve area (Vmean): 1.49 cm^2. Indexed valve area (Vmean): 0.76 cm^2/m^2. Mean gradient (S): 10 mm Hg. Peak gradient (S): 22 mm Hg.  ------------------------------------------------------------------- Aorta: Aortic root: The aortic root was normal in size. Ascending aorta: The ascending aorta was normal in size.  ------------------------------------------------------------------- Mitral valve:  Calcified annulus. Leaflet separation was normal. Doppler: Transvalvular velocity was within the normal range. There was no evidence for stenosis. There was mild regurgitation.  ------------------------------------------------------------------- Left atrium: The atrium was mildly dilated.  ------------------------------------------------------------------- Right ventricle: The cavity size was normal. Wall thickness was normal. Systolic function was normal.  ------------------------------------------------------------------- Ventricular septum:  Septal motion showed abnormal function, dyssynergy, and paradox. These changes are consistent with a left bundle branch block.  ------------------------------------------------------------------- Pulmonic valve:  Poorly visualized. The valve appears to be grossly normal.  Doppler: There was no significant regurgitation.  ------------------------------------------------------------------- Tricuspid valve:  Structurally normal  valve.  Leaflet separation was normal. Doppler: Transvalvular velocity was within the normal range. There was no regurgitation.  ------------------------------------------------------------------- Pulmonary artery:  Systolic pressure could not be accurately estimated.  ------------------------------------------------------------------- Right atrium: The atrium was normal in size.  ------------------------------------------------------------------- Pericardium: There was no pericardial effusion.  ------------------------------------------------------------------- Systemic veins: Inferior vena cava: The vessel was normal in size. The respirophasic diameter changes were in the normal range (>= 50%), consistent with normal central venous pressure.  ------------------------------------------------------------------- Measurements  Left  ventricle              Value     Reference LV ID, ED, PLAX chordal      (H)   53.7 mm    43 - 52 LV ID, ES, PLAX chordal      (H)   47  mm    23 - 38 LV fx shortening, PLAX chordal  (L)   12  %    >=29 LV PW thickness, ED            12.4 mm    --------- IVS/LV PW ratio, ED            1       <=1.3 Stroke volume, 2D             54  ml    --------- Stroke volume/bsa, 2D           28  ml/m^2  --------- LV ejection fraction, 1-p A4C       27  %    --------- LV end-diastolic volume, 2-p       151  ml    --------- LV end-systolic volume, 2-p        109  ml    --------- LV ejection fraction, 2-p         28  %    --------- Stroke volume, 2-p            42  ml    --------- LV end-diastolic volume/bsa, 2-p     78  ml/m^2  --------- LV end-systolic volume/bsa, 2-p      56  ml/m^2  --------- Stroke volume/bsa, 2-p          21.6 ml/m^2   --------- LV e&', lateral              10.3 cm/s   --------- LV E/e&', lateral             6.4      --------- LV e&', medial               3.05 cm/s   --------- LV E/e&', medial              21.61     --------- LV e&', average              6.68 cm/s   --------- LV E/e&', average             9.87      ---------  Ventricular septum            Value     Reference IVS thickness, ED             12.4 mm    ---------  LVOT                   Value     Reference LVOT ID, S                17  mm    --------- LVOT area                 2.27 cm^2   --------- LVOT peak velocity, S           134  cm/s   --------- LVOT mean velocity, S           93.6 cm/s   --------- LVOT VTI, S  23.9 cm    --------- LVOT peak gradient, S           7   mm Hg  ---------  Aortic valve               Value     Reference Aortic valve peak velocity, S       232  cm/s   --------- Aortic valve mean velocity, S       143  cm/s   --------- Aortic valve VTI, S            37.1 cm    --------- Aortic mean gradient, S          10  mm Hg  --------- Aortic peak gradient, S          22  mm Hg  --------- VTI ratio, LVOT/AV            0.64      --------- Aortic valve area, VTI          1.46 cm^2   --------- Aortic valve area/bsa, VTI        0.75 cm^2/m^2 --------- Velocity ratio, peak, LVOT/AV       0.58      --------- Aortic valve area, peak velocity     1.31 cm^2   --------- Aortic valve area/bsa, peak        0.67 cm^2/m^2 --------- velocity Velocity ratio, mean, LVOT/AV       0.65       --------- Aortic valve area, mean velocity     1.49 cm^2   --------- Aortic valve area/bsa, mean        0.76 cm^2/m^2 --------- velocity  Aorta                   Value     Reference Aortic root ID, ED            25  mm    ---------  Left atrium                Value     Reference LA ID, A-P, ES              42  mm    --------- LA ID/bsa, A-P              2.16 cm/m^2  <=2.2 LA volume, S               47  ml    --------- LA volume/bsa, S             24.1 ml/m^2  --------- LA volume, ES, 1-p A4C          63.8 ml    --------- LA volume/bsa, ES, 1-p A4C        32.8 ml/m^2  --------- LA volume, ES, 1-p A2C          31.7 ml    --------- LA volume/bsa, ES, 1-p A2C        16.3 ml/m^2  ---------  Mitral valve               Value     Reference Mitral E-wave peak velocity        65.9 cm/s   --------- Mitral A-wave peak velocity        158  cm/s   --------- Mitral deceleration time         171  ms    150 - 230 Mitral E/A ratio, peak  0.4      ---------  Systemic veins              Value     Reference Estimated CVP               3   mm Hg  ---------  Right ventricle              Value     Reference TAPSE                   9.9  mm    --------- RV s&', lateral, S             10.4 cm/s   ---------  Legend: (L) and (H) mark values outside specified reference range.  ------------------------------------------------------------------- Prepared and Electronically Authenticated by  Sanda Klein, MD 2016-08-10T15:12:55         Assessment/Plan: S/P Procedure(s) (LRB): TRANSCATHETER AORTIC VALVE  REPLACEMENT, TRANSFEMORAL (Bilateral) TRANSESOPHAGEAL ECHOCARDIOGRAM (TEE) (N/A)  Doing very well POD2 TAVR Maintaining NSR w/ stable BP off milrinone Post-op ECHO looks good w/ normal valve function other than mild paravalvular leak, stable LV function Chronic non-ischemic cardiomyopathy Chronic combined systolic and diastolic CHF Chronic LBBB Type II diabetes mellitus, excellent glycemic control CKD w/ stable renal function   Resume all pre-op medications  Not a candidate for ACE-I due to renal dysfunction  Continue ASA + Plavix for 6 months then ASA only  Transfer floor  Possible d/c home tomorrow if family can provide 24 hr/day supervision  Consider EPS referral in the future for Biventricular pacing depending on EF and CHF symptoms in follow up  Rexene Alberts 03/26/2015 7:59 AM

## 2015-03-27 ENCOUNTER — Other Ambulatory Visit: Payer: Self-pay | Admitting: Family Medicine

## 2015-03-27 LAB — BASIC METABOLIC PANEL
Anion gap: 8 (ref 5–15)
BUN: 24 mg/dL — ABNORMAL HIGH (ref 6–20)
CO2: 26 mmol/L (ref 22–32)
Calcium: 9.5 mg/dL (ref 8.9–10.3)
Chloride: 106 mmol/L (ref 101–111)
Creatinine, Ser: 1.78 mg/dL — ABNORMAL HIGH (ref 0.44–1.00)
GFR calc Af Amer: 32 mL/min — ABNORMAL LOW (ref >60)
GFR calc non Af Amer: 28 mL/min — ABNORMAL LOW (ref >60)
Glucose, Bld: 113 mg/dL — ABNORMAL HIGH (ref 65–99)
Potassium: 3.6 mmol/L (ref 3.5–5.1)
Sodium: 140 mmol/L (ref 135–145)

## 2015-03-27 LAB — GLUCOSE, CAPILLARY: Glucose-Capillary: 99 mg/dL (ref 65–99)

## 2015-03-27 MED ORDER — TRAMADOL HCL 50 MG PO TABS: 50.0000 mg | ORAL_TABLET | Freq: Four times a day (QID) | ORAL | Status: DC | PRN

## 2015-03-27 MED ORDER — POTASSIUM CHLORIDE CRYS ER 10 MEQ PO TBCR: 10.0000 meq | EXTENDED_RELEASE_TABLET | Freq: Every day | ORAL | Status: DC

## 2015-03-27 MED ORDER — CLOPIDOGREL BISULFATE 75 MG PO TABS: 75.0000 mg | ORAL_TABLET | Freq: Every day | ORAL | Status: DC

## 2015-03-27 NOTE — Progress Notes (Signed)
[address]       [place] [address]             [phone]      3 Days Post-Op Procedure(s) (LRB): TRANSCATHETER AORTIC VALVE REPLACEMENT, TRANSFEMORAL (Bilateral) TRANSESOPHAGEAL ECHOCARDIOGRAM (TEE) (N/A) Subjective: lloks and feels well  Objective: Vital signs in last 24 hours: Temp:  [98.2 F (36.8 C)-99.3 F (37.4 C)] 98.4 F (36.9 C) (08/12 0400) Pulse Rate:  [62-94] 67 (08/12 0700) Cardiac Rhythm:  [-] Normal sinus rhythm (08/12 0400) Resp:  [14-25] 18 (08/12 0700) BP: (97-133)/(56-79) 118/56 mmHg (08/12 0400) SpO2:  [98 %-100 %] 98 % (08/12 0400) Weight:  [182 lb 8.7 oz (82.8 kg)] 182 lb 8.7 oz (82.8 kg) (08/12 0500)  Hemodynamic parameters for last 24 hours:    Intake/Output from previous day: 08/11 0701 - 08/12 0700 In: 1020 [P.O.:1020] Out: 1800 [Urine:1800] Intake/Output this shift:    General appearance: alert, cooperative and no distress Heart: regular rate and rhythm Lungs: clear to auscultation bilaterally Abdomen: soft, nontender Extremities: no edema Wound: dressings CDI  Lab Results:  Recent Labs  03/25/15 0409 03/26/15 0319  WBC 6.6 6.9  HGB 10.8* 11.0*  HCT 33.0* 34.2*  PLT 113* 89*   BMET:  Recent Labs  03/26/15 0319 03/27/15 0247  NA 139 140  K 4.0 3.6  CL 104 106  CO2 26 26  GLUCOSE 111* 113*  BUN 20 24*  CREATININE 1.74* 1.78*  CALCIUM 9.5 9.5    PT/INR:  Recent Labs  03/24/15 1404  LABPROT 18.1*  INR 1.50*   ABG    Component Value Date/Time   PHART 7.341* 03/24/2015 1356   HCO3 21.0 03/24/2015 1356   TCO2 22 03/24/2015 1356   ACIDBASEDEF 5.0* 03/24/2015 1356   O2SAT 93.0 03/24/2015 1356   CBG (last 3)   Recent Labs  03/26/15 1122 03/26/15 1703 03/26/15 2206  GLUCAP 220* 130* 123*    Meds Scheduled Meds: . acetaminophen  1,000 mg Oral 4 times per day  . antiseptic oral rinse  7 mL Mouth Rinse BID  . aspirin EC  81 mg Oral Daily  . carvedilol  25 mg Oral BID WC  .  clopidogrel  75 mg Oral Q breakfast  . furosemide  40 mg Oral Daily  . glipiZIDE  5 mg Oral Q breakfast  . insulin aspart  0-15 Units Subcutaneous TID WC  . insulin detemir  20 Units Subcutaneous Daily  . pantoprazole  40 mg Oral Daily  . potassium chloride  20 mEq Oral Daily  . rosuvastatin  40 mg Oral Daily  . sodium chloride  3 mL Intravenous Q12H  . spironolactone  25 mg Oral Daily   Continuous Infusions:  PRN Meds:.sodium chloride, metoprolol, ondansetron (ZOFRAN) IV, potassium chloride, sodium chloride, traMADol  Xrays Dg Chest 2 View  03/26/2015   CLINICAL DATA:  Follow-up atelectasis, recent transcatheter aortic valve replacement.  EXAM: CHEST  2 VIEW  COMPARISON:  Portable chest x-ray of March 25, 2015 and March 24, 2015  FINDINGS: The lungs are slightly less well inflated today. Subsegmental atelectasis is more conspicuous bilaterally. There is no pleural effusion. The cardiac silhouette is enlarged. The pulmonary vascularity is not engorged. There has been interval removal of the Swan-Ganz catheter and right internal jugular Cordis sheath. The bony thorax exhibits no acute abnormality. On the lateral film the aortic valve cage is visible.  IMPRESSION: There is persistent mild bibasilar atelectasis slightly more conspicuous than  on yesterday's study. There is no CHF, pneumonia, nor other acute cardiopulmonary abnormality.   Electronically Signed   By: David  Martinique M.D.   On: 03/26/2015 07:35    Assessment/Plan: S/P Procedure(s) (LRB): TRANSCATHETER AORTIC VALVE REPLACEMENT, TRANSFEMORAL (Bilateral) TRANSESOPHAGEAL ECHOCARDIOGRAM (TEE) (N/A)  1 doing well, appears quite stable for discharge   LOS: 3 days    GOLD,WAYNE E 03/27/2015

## 2015-03-27 NOTE — Progress Notes (Signed)
     SUBJECTIVE: No chest pain or SOB  BP 118/56 mmHg  Pulse 67  Temp(Src) 98.4 F (36.9 C) (Oral)  Resp 18  Ht 5\' 3"  (1.6 m)  Wt 182 lb 8.7 oz (82.8 kg)  BMI 32.34 kg/m2  SpO2 98%  Intake/Output Summary (Last 24 hours) at 03/27/15 [id number] Last data filed at 03/27/15 0400  Gross per 24 hour  Intake   1020 ml  Output   1800 ml  Net   -780 ml    PHYSICAL EXAM General: Well developed, well nourished, in no acute distress. Alert and oriented x 3.  Psych:  Good affect, responds appropriately Neck: No JVD. No masses noted.  Lungs: Clear bilaterally with no wheezes or rhonci noted.  Heart: RRR with no murmurs noted. Abdomen: Bowel sounds are present. Soft, non-tender.  Extremities: No lower extremity edema.   LABS: Basic Metabolic Panel:  Recent Labs  03/25/15 0409 03/26/15 0319 03/27/15 0247  NA 139 139 140  K 3.6 4.0 3.6  CL 105 104 106  CO2 24 26 26   GLUCOSE 106* 111* 113*  BUN 20 20 24*  CREATININE 1.50* 1.74* 1.78*  CALCIUM 9.3 9.5 9.5  MG 1.6*  --   --    CBC:  Recent Labs  03/25/15 0409 03/26/15 0319  WBC 6.6 6.9  HGB 10.8* 11.0*  HCT 33.0* 34.2*  MCV 80.3 81.2  PLT 113* 89*   Current Meds: . acetaminophen  1,000 mg Oral 4 times per day  . antiseptic oral rinse  7 mL Mouth Rinse BID  . aspirin EC  81 mg Oral Daily  . carvedilol  25 mg Oral BID WC  . clopidogrel  75 mg Oral Q breakfast  . furosemide  40 mg Oral Daily  . glipiZIDE  5 mg Oral Q breakfast  . insulin aspart  0-15 Units Subcutaneous TID WC  . insulin detemir  20 Units Subcutaneous Daily  . pantoprazole  40 mg Oral Daily  . potassium chloride  20 mEq Oral Daily  . rosuvastatin  40 mg Oral Daily  . sodium chloride  3 mL Intravenous Q12H  . spironolactone  25 mg Oral Daily     ASSESSMENT AND PLAN:  1. Severe aortic valve stenosis: POD #3 s/p TAVR. She is doing well. She is on ASA and Plavix. Renal function is stable. She is back on her home meds. Echo 03/25/15 with mild  perivalvular leak.   2. Non-ischemic cardiomyopathy: Volume status ok. Continue current meds.   4. Chronic kidney disease,stage 3: Renal function stable post procedure.   Likely discharge home today.   [name] [name]  8/12/20167:11 AM

## 2015-03-27 NOTE — Progress Notes (Signed)
Pt given discharge instructions and AVS. Went over thoroughly and patient verbalized understanding. Discharged via wheelchair to son's car.  Eleonore Chiquito RN 2 [place]

## 2015-03-27 NOTE — Discharge Instructions (Signed)
GOING HOME  After your TAVR procedure, you will need help when you go home. It is hard to predict how much help you will need and for how long so it is best to plan ahead so you are sure to have the assistance you need. Most people who undergo TAVR say it takes one to three months to fully recover.  If you live alone, arrange for someone to stay with you or stay with family or friends for at least the first week to assist in your recovery. MEDICATIONS  Your discharge medications will be reviewed with you by your nurse prior to discharge. If you were given new prescriptions, they may be filled through our discharge medication service or may be sent to the pharmacy of your preference at the time of discharge. Pick them up prior to returning home.  Your primary provider of health care should review your medications during your first follow-up appointment so bring your hospital discharge papers with you.  There are some medications that help prevent complications after your heart valve procedure. For example: clopidogrel (Plavix) helps prevent blood clots from attaching to your new heart valve. SITE CARE  You likely will have small openings in both groins from catheters used during the procedure. If you had a transfemoral procedure, one groin will have a larger opening and may be bruised or tender.  If you had a chest procedure, you will have either a small incision in your upper sternum (breast-bone) or between your ribs on your left side. - Chest wall site: The surgical incision should be kept dry (no lotions / oils / powders) and open to air. If you experience irritation from clothing rubbing on the incision, a light gauze dressing may be applied. - Inspect your incision daily; notify your physician if there is increased redness, swelling or drainage from the incision. - If the incision is located on your breast-bone you must avoid lifting objects heavier than a gallon of  milk (eight pounds) and stretching / twisting / pulling with your arms for at least three months to ensure strong bone healing. - Notify your physician if you feel a popping of the breast-bone with movement.  Check your sites daily. Contact our office if you have any of the following problems: - Redness and warmth that does not go away - Yellow or green drainage from the wound - Fever and chills - Increasing numbness in your legs - Worsening pain at the site  If you had a leg/groin procedure, it is normal to have bruising or a soft lump at the site. It is not normal if the lump suddenly becomes larger or more firm. This may mean you are bleeding. If this happens: - Lie down - Have someone press down hard, just above the hole in your skin where the procedure was performed for 15 minutes. If after holding on the site, the lump does not become larger or harder, they are performing this correctly. - If the bleeding has stopped after 15 minutes, rest and stay laying down for at least two hours. - If the bleeding continues, call 911 for an ambulance. Do NOT drive yourself or have someone else drive you. DRESSING  Groin site: you may leave the clear dressing over the site for up to one week or until it falls off. HYGIENE  If you had a femoral (leg) procedure, you may take a shower when you return home. After the shower, pat the site dry. Do NOT use powder, oils  or lotions in your groin area until the site has completely healed.  If you had a chest procedure, you may shower when you return home unless specifically instructed not to by your discharging practitioner. - DO NOT scrub incision; pat dry with a towel - DO NOT apply any lotions, oils, powders to the incision - No tub baths / swimming for at least six weeks. FOLLOW-UP APPOINTMENTS Will be arranged  NUTRITION AND FLUIDS  Healthy eating is important for your healing and recovery. During cardiac rehabilitation you  will attend classes regarding heart-healthy eating.  Some people find they have a poor appetite for two to four weeks after TAVR. Try to keep your food choices healthy while your appetite returns.  If you limited the amount of fluid you could drink before your procedure, discuss with your heart doctor if you need to continue fluid limitation after the procedure.  Visit [email or web address] for heart-healthy recipes, cookbooks and lifestyle tips. [place] [phone] REPORTING YOUR HISTORY  In the future when you need health care always report that you have had a heart valve implantation. You will receive a heart valve identification card either at the time of your procedure or in the mail after you return home.  We recommend you keep the card in your wallet at all times and make a copy for your files in the event of loss.  If you go to the Emergency Department or are admitted to the hospital in the first month after your procedure. Ask the providers to call the cardiac surgery dept AT HOME WALKING Step 1 Get dressed in the morning. Take care of your personal needs (personal washing, making meals). Keep activities easy for short amounts of time and with rest periods. Walk around the house. Take stairs slowly, with rests. Step 2 Slowly return to activities around the house that dont involve a long time standing or using your arms (this causes more strain on your body). Walks should feel light and easy. Walk for five to 10 minutes at a time once or twice a day (such as a morning and afternoon walk). Stay close to home; avoid hills. Step 3 Do a few more activities around the house (making your bed, simple meals or watering plants). These walks should feel easy. Continue to walk once or twice a day. Over several days, lengthen your walks. For example, add five minutes every day or two. Step 4 Slowly start returning to your  usual activities again (like shopping, light gardening, going out with friends). When a 15-minute walk feels easy, you may increase your walking speed to a level that feels moderate. Begin cardiac rehabilitation. Continue to lengthen your walks until you are walking a total of 30 or more minutes five to six days per week. ACTIVITY PROGRESSION AT HOME  Follow these guidelines until you begin cardiac rehabilitation.  Move through Step 1 to 4 at your own pace. Take two to seven days to complete each step.  Pay attention to how you feel whenever you increase your activity or add new activities.  If you have any symptoms (unusual tiredness, shortness of breath, chest pain or dizziness), stop the activity and go back to the step where you had no symptoms. ACTIVITY AND EXERCISE  Daily activity and exercise are an important part of your recovery. People recover at different rates depending on their general health and type of valve procedure.  Most people require six to 10 weeks to feel recovered.  No  lifting, pushing, pulling more than 10 pounds (examples to avoid: groceries, vacuuming, gardening, golfing): - For one week with a procedure through the groin. - For six weeks for procedures through the chest wall. - For three months for procedures through the breast-bone.  After the initial healing process of the access site, we recommend cardiac rehabilitation for all TAVR patients. Cardiac rehabilitation will help you: - Rebuild stamina, strength and balance. - Learn how to participate in activities safely, as well as help you regain confidence to do so.  Aortic Valve Replacement  Aortic valve replacement is a procedure to replace an aortic valve that cannot be repaired. An artificial (prosthetic) valve is used to do this. Three types of prosthetic valves are available:   Mechanical valves made entirely from man-made materials.   Donor valves made from human donors.  These are only used in special situations.   Biological valves made from animal tissues.  The type of prosthetic valve used will be determined based on various factors, including your age, your lifestyle, and other medical conditions you have.  LET [facility] CARE PROVIDER KNOW ABOUT:  Any allergies you have.  All medicines you are taking, including vitamins, herbs, eye drops, creams, and over-the-counter medicines.  Previous problems you or members of your family have had with the use of anesthetics.  Any blood disorders you have.  Previous surgeries you have had.  Medical conditions you have. RISKS AND COMPLICATIONS Generally, this is a safe procedure. However, as with any procedure, problems can occur. Possible problems include:   Blood clotting caused by the new valve. Replacement with a mechanical valve requires lifelong treatment with medicine to prevent blood clots.   Infection in the new valve.   Valve failure.   Bleeding.   Reaction to anesthetics.  BEFORE THE PROCEDURE  Ask your health care provider about:  Changing or stopping your regular medicines. This is especially important if you are taking diabetes medicines or blood thinners.  Taking medicines such as aspirin and ibuprofen. These medicines can thin your blood. Do not take these medicines before your procedure if your health care provider asks you not to.  Do not eat or drink anything after midnight on the night before the procedure or as directed by your health care provider. PROCEDURE  The surgeon may use either an open technique or a minimally invasive technique for this surgery:  Traditional open surgery  You will be given a medicine that makes you fall asleep (general anesthetic).  You will then be placed on a heart-lung bypass machine. This machine provides oxygen to your blood while the heart is undergoing surgery.  During surgery, the surgeon will make a large cut (incision) in the  chest.  The heart will be cooled to slow or stop the heartbeat.  The damaged aortic valve will be removed and replaced with a prosthetic heart valve.  The incision will then be closed with staples or stitches. Minimally invasive surgery This is done through a smaller incision. This still requires general anesthetic and the heart-lung bypass machine. Your heart will be cooled to slow or stop the heartbeat, allowing the damaged valve to be removed and replaced with the new valve. The smaller incision will then be closed. If your condition allows for this procedure, there is often less blood loss, less pain, and faster recovery compared to traditional open surgery.  AFTER THE PROCEDURE You will be monitored closely in a recovery area. From there, you will likely go to an  intensive care unit.  Document Released: 12/21/2004 Document Revised: 12/16/2013 Document Reviewed: 01/08/2013 [facility] Patient Information 2015 [place], [place]. This information is not intended to replace advice given to you by your health care provider. Make sure you discuss any questions you have with your health care provider.  Anticoagulation, Generic Anticoagulants are medicines used to prevent clots from developing in your veins. These medicine are also known as blood thinners. If blood clots are untreated, they could travel to your lungs. This is called a pulmonary embolus. A blood clot in your lungs can be fatal.  Health care providers often use anticoagulants to prevent clots following surgery. Anticoagulants are also used along with aspirin when the heart is not getting enough blood. Another anticoagulant called warfarin is started 2 to 3 days after a rapid-acting injectable anticoagulant is started. The rapid-acting anticoagulants are usually continued until warfarin has begun to work. Your health care provider will judge this length of time by blood tests known as the prothrombin time (PT) and International Normalization  Ratio (INR). This means that your blood is at the necessary and best level to prevent clots. RISKS AND COMPLICATIONS  If you have received recent epidural anesthesia, spinal anesthesia, or a spinal tap while receiving anticoagulants, you are at risk for developing a blood clot in or around the spine. This condition could result in long-term or permanent paralysis.  Because anticoagulants thin your blood, severe bleeding may occur from any tissue or organ. Symptoms of the blood being too thin may include:  Bleeding from the nose or gums that does not stop quickly.  Blood in bowel movements which may appear as bright red, dark, or black tarry stools.  Blood in the urine which may appear as pink, red, or brown urine.  Unusual bruising or bruising easily.  A cut that does not stop bleeding within 10 minutes.  Vomiting blood or continuous nausea for more than 1 day.  Coughing up blood.  Broken blood vessels in your eye (subconjunctival hemorrhage).  Abdominal or back pain with or without flank bruising.  Sudden, severe headache.  Sudden weakness or numbness of the face, arm, or leg, especially on one side of the body.  Sudden confusion.  Trouble speaking (aphasia) or understanding.  Sudden trouble seeing in one or both eyes.  Sudden trouble walking.  Dizziness.  Loss of balance or coordination.  Vaginal bleeding.  Swelling or pain at an injection site.  Superficial fat tissue death (necrosis) which may cause skin scarring. This is more common in women and may first present as pain in the waist, thighs, or buttocks.  Fever.  Too little anticoagulation continues to allow the risk for blood clots. HOME CARE INSTRUCTIONS   Due to the complications of anticoagulants, it is very important that you take your anticoagulant as directed by your health care provider. Anticoagulants need to be taken exactly as instructed. Be sure you understand all your anticoagulant  instructions.  Keep all follow-up appointments with your health care provider as directed. It is very important to keep your appointments. Not keeping appointments could result in a chronic or permanent injury, pain, or disability.  Warfarin. Your health care provider will advise you on the length of treatment (usually 3-6 months, sometimes lifelong).  Take warfarin exactly as directed by your health care provider. It is recommended that you take your warfarin dose at the same time of the day. It is preferred that you take warfarin in the late afternoon. If you have been told to  stop taking warfarin, do not resume taking warfarin until directed to do so by your health care provider. Follow your health care provider's instructions if you accidentally take an extra dose or miss a dose of warfarin. It is very important to take warfarin as directed since bleeding or blood clots could result in chronic or permanent injury, pain, or disability.  Too much and too little warfarin are both dangerous. Too much warfarin increases the risk of bleeding. Too little warfarin continues to allow the risk for blood clots. While taking warfarin, you will need to have regular blood tests to measure your blood clotting time. These blood tests usually include both the prothrombin time (PT) and International Normalized Ratio (INR) tests. The PT and INR results allow your health care provider to adjust your dose of warfarin. The dose can change for many reasons. It is critically important that you have your PT and INR levels drawn exactly as directed. Your warfarin dose may stay the same or change depending on what the PT and INR results are. Be sure to follow up with your health care provider regarding your PT and INR test results and what your warfarin dosage should be.  Many medicines can interfere with warfarin and affect the PT and INR results. You must tell your health care provider about any and all medicines you take,  this includes all vitamins and supplements. Ask your health care provider before taking these. Prescription and over-the-counter medicine consistency is critical to warfarin management. It is important that potential interactions are checked before you start a new medicine. Be especially cautious with aspirin and anti-inflammatory medicines. Ask your health care provider before taking these. Medicines such as antibiotics and acid-reducing medicine can interact with warfarin and can cause an increased warfarin effect. Warfarin can also interfere with the effectiveness of medicines you are taking. Do not take or discontinue any prescribed or over-the-counter medicine except on the advice of your health care provider or pharmacist.  Some vitamins, supplements, and herbal products interfere with the effectiveness of warfarin. Vitamin E may increase the anticoagulant effects of warfarin. Vitamin K may can cause warfarin to be less effective. Do not take or discontinue any vitamin, supplement, or herbal product except on the advice of your health care provider or pharmacist.  Eat what you normally eat and keep the vitamin K content of your diet consistent. Avoid major changes in your diet, or notify your health care provider before changing your diet. Suddenly getting a lot more vitamin K could cause your blood to clot too quickly. A sudden decrease in vitamin K intake could cause your blood to clot too slowly. These changes in vitamin K intake could lead to dangerous blood clotsor to bleeding. To keep your vitamin K intake consistent, you must be aware of which foods contain moderate or high amounts of vitamin K. Some foods high in vitamin K include spinach, kale, broccoli, cabbage, greens, Brussels sprouts, asparagus, B[place]oy, coleslaw, parsley, and green tea. Arrange a visit with a dietitian to answer your questions.  If you have a loss of appetite or get the stomach flu (viral gastroenteritis), talk to your  health care provider as soon as possible. A decrease in your normal vitamin K intake can make you more sensitive to your usual dose of warfarin.  Some medical conditions may increase your risk for bleeding while you are taking warfarin. A fever, diarrhea lasting more than a day, worsening heart failure, or worsening liver function are some medical  conditions that could affect warfarin. Contact your health care provider if you have any of these medical conditions.  Alcohol can change the body's ability to handle warfarin. It is best to avoid alcoholic drinks or consume only very small amounts while taking warfarin. Notify your health care provider if you change your alcohol intake. A sudden increase in alcohol use can increase your risk of bleeding. Chronic alcohol use can cause warfarin to be less effective.  Be careful not to cut yourself when using sharp objects or while shaving.  Inform all your health care providers and your dentist that you take an anticoagulant.  Limit physical activities or sports that could result in a fall or cause injury. Avoid contact sports.  Wear medical alert jewelry or carry a medical alert card. SEEK IMMEDIATE MEDICAL CARE IF:  You cough up blood.  You have dark or black stools or there is bright red blood coming from your rectum.  You vomit blood or have nausea for more than 1 day.  You have blood in the urine or pink colored urine.  You have unusual bruising or have increased bruising.  You have bleeding from the nose or gums that does not stop quickly.  You have a cut that does not stop bleeding within a 2-3 minutes.  You have sudden weakness or numbness of the face, arm, or leg, especially on one side of the body.  You have sudden confusion.  You have trouble speaking (aphasia) or understanding.  You have sudden trouble seeing in one or both eyes.  You have sudden trouble walking.  You have dizziness.  You have a loss of balance or  coordination.  You have a sudden, severe headache.  You have a serious fall or head injury, even if you are not bleeding.  You have swelling or pain at an injection site.  You have unexplained tenderness or pain in the abdomen, back, waist, thighs or buttocks.  You have a fever. Any of these symptoms may represent a serious problem that is an emergency. Do not wait to see if the symptoms will go away. Get medical help right away. Call your local emergency services (911 in [place] Do not drive yourself to the hospital. Document Released: 08/01/2005 Document Revised: 08/06/2013 Document Reviewed: 03/05/2008 [facility] Patient Information 2015 [place], [place]. This information is not intended to replace advice given to you by your health care provider. Make sure you discuss any questions you have with your health care provider.

## 2015-03-27 NOTE — Care Management Important Message (Signed)
Important Message  Patient Details  Name: [name] [name] MRN: [id number] Date of Birth: Sep 19, 1945   Medicare Important Message Given:  Yes-second notification given    Nathen May 03/27/2015, 12:19 [place] Message  Patient Details  Name: [name] [name] MRN: [id number] Date of Birth: May 29, 1946   Medicare Important Message Given:  Yes-second notification given    Nathen May 03/27/2015, 12:19 PM

## 2015-03-31 ENCOUNTER — Telehealth (HOSPITAL_COMMUNITY): Payer: Self-pay | Admitting: Nurse Practitioner

## 2015-03-31 ENCOUNTER — Encounter: Payer: Self-pay | Admitting: *Deleted

## 2015-03-31 ENCOUNTER — Telehealth: Payer: Self-pay | Admitting: *Deleted

## 2015-03-31 DIAGNOSIS — I350 Nonrheumatic aortic (valve) stenosis: Secondary | ICD-10-CM

## 2015-03-31 MED FILL — Phenylephrine HCl Inj 10 MG/ML: INTRAMUSCULAR | Qty: 2 | Status: AC

## 2015-03-31 MED FILL — Dextrose Inj 5%: INTRAVENOUS | Qty: 250 | Status: AC

## 2015-03-31 NOTE — Telephone Encounter (Signed)
I spoke with pt and gave her appt times.  Pt confirms she can be here for appts. Will mail letter to her with appt times per her request.

## 2015-03-31 NOTE — Telephone Encounter (Signed)
I placed call to pt's home number to schedule TAVR follow up appt with Dr. [name] and echo. No answer.  I called cell number listed for pt and number is for pt's son.  He will ask her to contact office when he speaks with her.  I have scheduled appt for echo on April 29, 2015 at 1:00 and appt with Dr. [name] for 2:00 the same day.

## 2015-04-02 ENCOUNTER — Telehealth: Payer: Self-pay | Admitting: *Deleted

## 2015-04-02 NOTE — Telephone Encounter (Signed)
[name] [name] from Dr. Lannette Donath office called stating they need a silver back referral code I35.0 faxed to [phone]. Waiting on silver back

## 2015-04-03 ENCOUNTER — Other Ambulatory Visit: Payer: Commercial Managed Care - HMO

## 2015-04-03 MED ORDER — FENOFIBRATE 145 MG PO TABS: 145.0000 mg | ORAL_TABLET | Freq: Every day | ORAL | Status: DC

## 2015-04-06 ENCOUNTER — Ambulatory Visit (INDEPENDENT_AMBULATORY_CARE_PROVIDER_SITE_OTHER): Payer: Commercial Managed Care - HMO | Admitting: Surgical

## 2015-04-06 VITALS — BP 127/78 | HR 92 | Resp 19 | Ht 63.0 in | Wt 184.0 lb

## 2015-04-06 DIAGNOSIS — I350 Nonrheumatic aortic (valve) stenosis: Secondary | ICD-10-CM

## 2015-04-06 DIAGNOSIS — Z954 Presence of other heart-valve replacement: Secondary | ICD-10-CM

## 2015-04-06 DIAGNOSIS — Z952 Presence of prosthetic heart valve: Secondary | ICD-10-CM

## 2015-04-06 NOTE — Patient Instructions (Signed)
Verbal instructions given

## 2015-04-06 NOTE — Progress Notes (Signed)
[address]       ,[place] [address]             [id number]                  [name] [name] Medical Record [id number] Date of Birth: March 29, 1946  Referring UK:[id number], Aloha Gell, MD Primary Cardiology: Primary Care:Margaret Moshe Cipro, MD  Chief Complaint:  Follow Up Visit CARDIOTHORACIC SURGERY OPERATIVE NOTE  Date of Procedure:03/24/2015  Preoperative Diagnosis:Severe Aortic Stenosis   Postoperative Diagnosis:Same   Procedure:   Transcatheter Aortic Valve Replacement - Open Right Transfemoral Approach Edwards Sapien 3 Transcatheter Heart Valve (size 23 mm, model # 9600TFX, serial # [id number])  Co-Surgeons:Clarence H. Roxy Manns, MD and Lauree Chandler, MD  Assistants:Bryan Alveria Apley, MD   Anesthesiologist:Chris Ermalene Postin, MD  Dala Dock, MD  Pre-operative Echo Findings:  Severe aortic stenosis  Severe left ventricular systolic dysfunction  Post-operative Echo Findings:  Trace paravalvular leak  Unchanged left ventricular systolic function  History of Present Illness:      The patient is a 69 year old black female status post above procedure seen in the office on today's date in routine follow-up. She states that she is feeling well. She is doing routine household activities without significant difficulty. She is not having any significant shortness of breath, chest pain or palpitations. She is not requiring any pain medication. She has not had any difficulty with her incision.       Zubrod Score: At the time of surgery this patient's most appropriate activity status/level should be described as: []     0    Normal activity, no symptoms []     1    Restricted in physical strenuous activity but ambulatory, able to do out light work []     2    Ambulatory and capable of self  care, unable to do work activities, up and about                 >50 % of waking hours                                                                                   []     3    Only limited self care, in bed greater than 50% of waking hours []     4    Completely disabled, no self care, confined to bed or chair []     5    Moribund  History  Smoking status  . Never Smoker   Smokeless tobacco  . Never Used       Allergies  Allergen Reactions  . Penicillins Other (See Comments)    Family unsure of reaction, but certain mom said she was allergic    Current Outpatient Prescriptions  Medication Sig Dispense Refill  . acetaminophen (TYLENOL) 500 MG tablet Take 1,000 mg by mouth every 6 (six) hours as needed for moderate pain.    Marland Kitchen aspirin EC 81 MG tablet Take 81 mg by mouth daily.    . carvedilol (COREG) 25 MG tablet Take 25 mg by mouth 2 (two) times daily with a meal.     .  clopidogrel (PLAVIX) 75 MG tablet Take 1 tablet (75 mg total) by mouth daily with breakfast. 30 tablet 1  . CRESTOR 40 MG tablet Take 40 mg by mouth daily.     Regino Schultze TEST test strip 1 each by Other route See admin instructions. Check blood sugar twice daily    . fenofibrate (TRICOR) 145 MG tablet Take 1 tablet (145 mg total) by mouth daily. 30 tablet 3  . furosemide (LASIX) 40 MG tablet Take 40 mg by mouth daily.     Marland Kitchen glipiZIDE (GLUCOTROL XL) 5 MG 24 hr tablet TAKE 1 TABLET BY MOUTH DAILY WITH BREAKFAST. 30 tablet 3  . HUMALOG MIX 50/50 KWIKPEN (50-50) 100 UNIT/ML Kwikpen Inject 12 Units into the skin 2 (two) times daily.     . Multiple Vitamins-Minerals (MULTIVITAMIN PO) Take 1 tablet by mouth daily.    . niacin (NIASPAN) 1000 MG CR tablet Take 1,000 mg by mouth at bedtime.     . potassium chloride SA (K-DUR,KLOR-CON) 10 MEQ tablet Take 1 tablet (10 mEq total) by mouth daily. 30 tablet 1  . spironolactone (ALDACTONE) 25 MG tablet Take 25 mg by mouth daily.     . traMADol (ULTRAM) 50 MG tablet Take 1-2 tablets  (50-100 mg total) by mouth every 6 (six) hours as needed for moderate pain. 40 tablet 0  . UNIFINE PENTIPS 31G X 8 MM MISC 1 each by Other route See admin instructions. Use as directed with insulin twice daily    . [DISCONTINUED] cetirizine (ZYRTEC) 10 MG tablet Take 1 tablet (10 mg total) by mouth daily. 30 tablet 3  . [DISCONTINUED] ferrous sulfate 325 (65 FE) MG EC tablet Take 1 tablet (325 mg total) by mouth 2 (two) times daily. 60 tablet 5  . [DISCONTINUED] FLUoxetine (PROZAC) 10 MG tablet Take 1 tablet (10 mg total) by mouth daily. Take one capsule by mouth once a day 30 tablet 3  . [DISCONTINUED] fluticasone (FLONASE) 50 MCG/ACT nasal spray Place 2 sprays into the nose daily. 16 g 3   No current facility-administered medications for this visit.       Physical Exam: BP 127/78 mmHg  Pulse 92  Resp 19  Ht 5\' 3"  (1.6 m)  Wt 184 lb (83.462 kg)  BMI 32.60 kg/m2  SpO2 99%  General appearance: alert, cooperative and no distress Heart: regular rate and rhythm and systolic murmur: holosystolic 2/6, crescendo at 2nd right intercostal space Lungs: clear to auscultation bilaterally Extremities: extremities normal, atraumatic, no cyanosis or edema Wounds: Right femoral incision healing well without signs of infection.  Diagnostic Studies & Laboratory data:         Recent Radiology Findings: No results found.    I  Recent Labs: Lab Results  Component Value Date   WBC 6.9 03/26/2015   HGB 11.0* 03/26/2015   HCT 34.2* 03/26/2015   PLT 89* 03/26/2015   GLUCOSE 113* 03/27/2015   CHOL 189 03/11/2015   TRIG 58 03/11/2015   HDL 83 03/11/2015   LDLCALC 94 03/11/2015   ALT 21 03/20/2015   AST 30 03/20/2015   NA 140 03/27/2015   K 3.6 03/27/2015   CL 106 03/27/2015   CREATININE 1.78* 03/27/2015   BUN 24* 03/27/2015   CO2 26 03/27/2015   TSH 2.455 09/12/2014   INR 1.50* 03/24/2015   HGBA1C 8.2* 03/20/2015      Assessment / Plan:  The patient is doing quite well in her  recovery from T AVR. There are no  current surgically related issues. He does not appear to have any evidence of heart failure. She'll be seen in the tablet clinic at the 30 day appointment. She is scheduled to start cardiac rehabilitation soon. We discussed activity progression and she understands.         GOLD,WAYNE E 04/06/2015 2:06 PM

## 2015-04-24 ENCOUNTER — Other Ambulatory Visit: Payer: Self-pay

## 2015-04-24 MED ORDER — EASYMAX TEST VI STRP: ORAL_STRIP | Status: DC

## 2015-04-27 ENCOUNTER — Other Ambulatory Visit: Payer: Self-pay

## 2015-04-29 ENCOUNTER — Other Ambulatory Visit: Payer: Self-pay

## 2015-04-29 ENCOUNTER — Ambulatory Visit (HOSPITAL_COMMUNITY): Payer: Commercial Managed Care - HMO | Attending: Cardiology

## 2015-04-29 ENCOUNTER — Ambulatory Visit (INDEPENDENT_AMBULATORY_CARE_PROVIDER_SITE_OTHER): Payer: Medicare HMO | Admitting: Cardiovascular Disease

## 2015-04-29 ENCOUNTER — Encounter: Payer: Self-pay | Admitting: Cardiovascular Disease

## 2015-04-29 VITALS — BP 130/62 | HR 73 | Ht 65.0 in | Wt 183.0 lb

## 2015-04-29 DIAGNOSIS — I10 Essential (primary) hypertension: Secondary | ICD-10-CM | POA: Insufficient documentation

## 2015-04-29 DIAGNOSIS — I517 Cardiomegaly: Secondary | ICD-10-CM | POA: Diagnosis not present

## 2015-04-29 DIAGNOSIS — Z954 Presence of other heart-valve replacement: Secondary | ICD-10-CM

## 2015-04-29 DIAGNOSIS — E119 Type 2 diabetes mellitus without complications: Secondary | ICD-10-CM | POA: Diagnosis not present

## 2015-04-29 DIAGNOSIS — R29898 Other symptoms and signs involving the musculoskeletal system: Secondary | ICD-10-CM | POA: Insufficient documentation

## 2015-04-29 DIAGNOSIS — I350 Nonrheumatic aortic (valve) stenosis: Secondary | ICD-10-CM

## 2015-04-29 DIAGNOSIS — I340 Nonrheumatic mitral (valve) insufficiency: Secondary | ICD-10-CM | POA: Insufficient documentation

## 2015-04-29 DIAGNOSIS — Z952 Presence of prosthetic heart valve: Secondary | ICD-10-CM | POA: Diagnosis present

## 2015-04-29 NOTE — Patient Instructions (Signed)
Medication Instructions:  Your physician recommends that you continue on your current medications as directed. Please refer to the Current Medication list given to you today.   Labwork: none  Testing/Procedures: Your physician has requested that you have an echocardiogram. Echocardiography is a painless test that uses sound waves to create images of your heart. It provides your doctor with information about the size and shape of your heart and how well your heart's chambers and valves are working. This procedure takes approximately one hour. There are no restrictions for this procedure. To be done in 12 months on day of appointment with Dr. [name]    Follow-Up: Your physician recommends that you schedule a follow-up appointment in: 3 months with Dr. [name]  Your physician wants you to follow-up in: 12 months with Dr. Shirley Friar will receive a reminder letter in the mail two months in advance. If you don't receive a letter, please call our office to schedule the follow-up appointment.   Any Other Special Instructions Will Be Listed Below (If Applicable).

## 2015-04-29 NOTE — Progress Notes (Signed)
Chief Complaint  Patient presents with  . Follow-up    History of Present Illness: 69 yo female with history of NICM, HTN, HLD, severe aortic valve stenosis now s/p TAVR on 03/24/15. She is here today for one month TAVR follow up. Her TAVR procedure was performed on 03/24/15. She had placement of a 23 mm Edwards Sapien 3 valve from the right femoral artery approach. She did well following the procedure.   She is here today for follow up. She has been doing well. She has no chest pain. Breathing is better. She has mild dyspnea with exertion. Right groin is still sore but much improved.   Primary Care Physician: [name], MD   Last Lipid Profile:Lipid Panel     Component Value Date/Time   CHOL 189 03/11/2015 1027   TRIG 58 03/11/2015 1027   HDL 83 03/11/2015 1027   CHOLHDL 2.3 03/11/2015 1027   VLDL 12 03/11/2015 1027   [place] 94 03/11/2015 1027     Past Medical History  Diagnosis Date  . Essential hypertension, benign   . Hyperlipidemia   . Anxiety   . Aortic stenosis   . Symptomatic PVCs   . Nonischemic cardiomyopathy     No significant obstructive CAD at heart catheterization 05/2014, LVEF 20%  . CKD (chronic kidney disease) stage 3, GFR 30-59 ml/min   . Chronic systolic heart failure   . Obesity   . Depression   . Constipation   . Incidental pulmonary nodule, > 64mm and < 60mm     7 x 4 mm LLL nodule  . Arthritis   . LBBB (left bundle branch block)   . Pneumonia 2013  . Diabetes mellitus, type 2   . CHF (congestive heart failure)   . 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 Cesarean section  1987  . Multiple extractions with alveoloplasty N/A 02/27/2014    Procedure: Extraction of tooth #'s 2,4,5,6,7,8,9,10,11,12, 22, 23, 24, 25, 26, 28 with alveoloplasty and bilateral mandibular tori  reductions;  Surgeon: [name], DDS;  Location: [facility];  Service: Oral Surgery;  Laterality: N/A;  . Tubal ligation  1987  . Video bronchoscopy Bilateral 06/05/2014    Procedure: VIDEO BRONCHOSCOPY WITHOUT FLUORO;  Surgeon: [name], MD;  Location: [facility];  Service: Cardiopulmonary;  Laterality: Bilateral;  . Left and right heart catheterization with coronary angiogram N/A 12/05/2011    Procedure: LEFT AND RIGHT HEART CATHETERIZATION WITH CORONARY ANGIOGRAM;  Surgeon: [name], MD;  Location: [facility] C[facility] LAB;  Service: Cardiovascular;  Laterality: N/A;  . Left and right heart catheterization with coronary angiogram N/A 05/23/2014    Procedure: LEFT AND RIGHT HEART CATHETERIZATION WITH CORONARY ANGIOGRAM;  Surgeon: [name], MD;  Location: [facility] CATH LAB;  Service: Cardiovascular;  Laterality: N/A;  . Cardiac catheterization    . Flexible bronchoscopy N/A 01/14/2015    Procedure: FLEXIBLE BRONCHOSCOPY;  Surgeon: [name], MD;  Location: [facility];  Service: Thoracic;  Laterality: N/A;  . Rigid bronchoscopy N/A 01/14/2015    Procedure: RIGID BRONCHOSCOPY with Removal Of Foreign Body ;  Surgeon: [name], MD;  Location: [facility];  Service: Thoracic;  Laterality: N/A;  . Transcatheter aortic valve replacement, transfemoral Bilateral 03/24/2015    Procedure: TRANSCATHETER AORTIC VALVE REPLACEMENT, TRANSFEMORAL;  Surgeon: Annita Brod  [name], MD;  Location: [facility];  Service: Open Heart Surgery;  Laterality: Bilateral;  . Tee without cardioversion N/A 03/24/2015    Procedure: TRANSESOPHAGEAL ECHOCARDIOGRAM (TEE);  Surgeon: [name], MD;  Location: [facility];  Service: Open Heart Surgery;  Laterality: N/A;    Current Outpatient Prescriptions  Medication Sig Dispense Refill  . acetaminophen (TYLENOL) 500 MG tablet Take 1,000 mg by mouth every 6 (six) hours as needed for moderate pain.    Marland Kitchen ALPRAZolam (XANAX) 0.25 MG tablet Take 1 tablet by  mouth as needed.    Marland Kitchen aspirin EC 81 MG tablet Take 81 mg by mouth daily.    . carvedilol (COREG) 25 MG tablet Take 25 mg by mouth 2 (two) times daily with a meal.     . clopidogrel (PLAVIX) 75 MG tablet Take 1 tablet (75 mg total) by mouth daily with breakfast. 30 tablet 1  . CRESTOR 40 MG tablet Take 40 mg by mouth daily.     Regino Schultze TEST test strip Check blood sugar three times daily dx E10.9 100 each 5  . fenofibrate (TRICOR) 145 MG tablet Take 1 tablet (145 mg total) by mouth daily. 30 tablet 3  . furosemide (LASIX) 40 MG tablet Take 40 mg by mouth daily.     Marland Kitchen glipiZIDE (GLUCOTROL XL) 5 MG 24 hr tablet TAKE 1 TABLET BY MOUTH DAILY WITH BREAKFAST. 30 tablet 3  . HUMALOG MIX 50/50 KWIKPEN (50-50) 100 UNIT/ML Kwikpen Inject 12 Units into the skin 2 (two) times daily.     . Multiple Vitamins-Minerals (MULTIVITAMIN PO) Take 1 tablet by mouth daily.    . niacin (NIASPAN) 1000 MG CR tablet Take 1,000 mg by mouth at bedtime.     . potassium chloride (K-DUR) 10 MEQ tablet Take 10 mEq by mouth daily.    . potassium chloride SA (K-DUR,KLOR-CON) 10 MEQ tablet Take 1 tablet (10 mEq total) by mouth daily. 30 tablet 1  . spironolactone (ALDACTONE) 25 MG tablet Take 25 mg by mouth daily.     . traMADol (ULTRAM) 50 MG tablet Take 1-2 tablets (50-100 mg total) by mouth every 6 (six) hours as needed for moderate pain. 40 tablet 0  . UNIFINE PENTIPS 31G X 8 MM MISC 1 each by Other route See admin instructions. Use as directed with insulin twice daily    . [DISCONTINUED] cetirizine (ZYRTEC) 10 MG tablet Take 1 tablet (10 mg total) by mouth daily. 30 tablet 3  . [DISCONTINUED] ferrous sulfate 325 (65 FE) MG EC tablet Take 1 tablet (325 mg total) by mouth 2 (two) times daily. 60 tablet 5  . [DISCONTINUED] FLUoxetine (PROZAC) 10 MG tablet Take 1 tablet (10 mg total) by mouth daily. Take one capsule by mouth once a day 30 tablet 3  . [DISCONTINUED] fluticasone (FLONASE) 50 MCG/ACT nasal spray Place 2 sprays into  the nose daily. 16 g 3   No current facility-administered medications for this visit.    Allergies  Allergen Reactions  . Penicillins Other (See Comments)    Family unsure of reaction, but certain mom said she was allergic    Social History   Social History  . Marital Status: Married    Spouse Name: N/A  . Number of Children: 5  . Years of Education: N/A   Occupational History  . Retired    Social History Main Topics  . Smoking status: Never Smoker   . Smokeless tobacco: Never Used  . Alcohol Use: No  .  Drug Use: No  . Sexual Activity: No   Other Topics Concern  . Not on file   Social History Narrative    Family History  Problem Relation Age of Onset  . Heart disease Mother   . Heart attack Mother 56  . Diabetes Mother   . Hypertension Mother   . Colon cancer Father 47  . Breast cancer Sister   . Diabetes Brother   . Hypertension Brother   . Stroke Brother     Review of Systems:  As stated in the HPI and otherwise negative.   BP 130/62 mmHg  Pulse 73  Ht 5\' 5"  (1.651 m)  Wt 183 lb (83.008 kg)  BMI 30.45 kg/m2  SpO2 96%  Physical Examination: General: Well developed, well nourished, NAD HEENT: OP clear, mucus membranes moist SKIN: warm, dry. No rashes. Neuro: No focal deficits Musculoskeletal: Muscle strength 5/5 all ext Psychiatric: Mood and affect normal Neck: No JVD, no carotid bruits, no thyromegaly, no lymphadenopathy. Lungs:Clear bilaterally, no wheezes, rhonci, crackles Cardiovascular: Regular rate and rhythm. No murmurs, gallops or rubs. Abdomen:Soft. Bowel sounds present. Non-tender.  Extremities: No lower extremity edema. Pulses are 2 + in the bilateral DP/PT.  Echo 04/29/15: Left ventricle: The cavity size was mildly dilated. Wall thickness was normal. Septal akinesis, inferior akinesis, anterior severe hypokinesis. The estimated ejection fraction was 20%. Doppler parameters are consistent with abnormal left ventricular  relaxation (grade 1 diastolic dysfunction). - Aortic valve: Bioprosthetic aortic valve s/p TAVR. No significant stenosis or regurgitation. Mean gradient (S): 13 mm Hg. - Mitral valve: Mildly calcified annulus. There was trivial regurgitation. - Left atrium: The atrium was mildly dilated. - Right ventricle: The cavity size was normal. Systolic function was normal. - Tricuspid valve: Peak RV-RA gradient (S): 30 mm Hg. - Pulmonary arteries: PA peak pressure: 33 mm Hg (S). - Inferior vena cava: The vessel was normal in size. The respirophasic diameter changes were in the normal range (= 50%), consistent with normal central venous pressure.  Impressions:  - Mildly dilated LV with EF 20%. Wall motion abnormalities as noted above. Normal RV size and systolic function. Bioprosthetic aortic valve s/p TAVR, valve is functioning normally.  EKG:  EKG is not ordered today. The ekg ordered today demonstrates   Recent Labs: 09/12/2014: TSH 2.455 03/20/2015: ALT 21 03/25/2015: Magnesium 1.6* 03/26/2015: Hemoglobin 11.0*; Platelets 89* 03/27/2015: BUN 24*; Creatinine, Ser 1.78*; Potassium 3.6; Sodium 140   Lipid Panel    Component Value Date/Time   CHOL 189 03/11/2015 1027   TRIG 58 03/11/2015 1027   HDL 83 03/11/2015 1027   CHOLHDL 2.3 03/11/2015 1027   VLDL 12 03/11/2015 1027   LDLCALC 94 03/11/2015 1027     Wt Readings from Last 3 Encounters:  04/29/15 183 lb (83.008 kg)  04/06/15 184 lb (83.462 kg)  03/27/15 182 lb 8.7 oz (82.8 kg)     Other studies Reviewed: Additional studies/ records that were reviewed today include: . Review of the above records demonstrates:    Assessment and Plan:   1. Severe aortic valve stenosis: She is doing well post TAVR. She is on ASA and Plavix. Echo today shows mean gradient 13 mm Hg across bioprosthetic valve. No changes today. She is NYHA Class II. She will f/u with Dr. [name] in 3 months. I will see her back in one year for TAVR  f/u. Groin wound is well healed.   Current medicines are reviewed at length with the patient today.  The patient does not  have concerns regarding medicines.  The following changes have been made:  no change  Labs/ tests ordered today include:  No orders of the defined types were placed in this encounter.    Disposition:   FU with me in 12  months  Signed, Lauree Chandler, MD 04/30/2015 12:30 PM    [facility] [address], [place], [place]  [address] Phone: [phone]; Fax: [phone]

## 2015-05-06 ENCOUNTER — Encounter (HOSPITAL_COMMUNITY): Payer: Self-pay

## 2015-05-06 ENCOUNTER — Encounter (HOSPITAL_COMMUNITY)
Admission: RE | Admit: 2015-05-06 | Discharge: 2015-05-06 | Disposition: A | Discharge status: Home / Self Care | Payer: Commercial Managed Care - HMO | Source: Ambulatory Visit | Attending: Cardiology | Admitting: Cardiology

## 2015-05-06 VITALS — BP 128/80 | HR 78 | Ht 66.0 in | Wt 184.5 lb

## 2015-05-06 DIAGNOSIS — Z952 Presence of prosthetic heart valve: Secondary | ICD-10-CM | POA: Diagnosis not present

## 2015-05-06 DIAGNOSIS — I350 Nonrheumatic aortic (valve) stenosis: Secondary | ICD-10-CM | POA: Diagnosis not present

## 2015-05-06 NOTE — Progress Notes (Signed)
Patient arrived for 1st visit/orientation/education at 1315. Patient was referred to CR by Dr. [name] due to AVR (Z95.4). During orientation advised patient on arrival and appointment times what to wear, what to do before, during and after exercise. Reviewed attendance and class policy. Talked about inclement weather and class consultation policy. Pt is scheduled to return Cardiac Rehab on May 11, 2015 at 0930. Pt was advised to come to class 5 minutes before class starts. He was also given instructions on meeting with the dietician and attending the Family Structure classes. Pt is eager to get started. Patient was able to complete 6 minute walk test. Patient was measured for the equipment. Discussed equipment safety with patient. Took patient pre-anthropometric measurements. Patient finished visit at 1450.

## 2015-05-06 NOTE — Progress Notes (Signed)
Cardiac/Pulmonary Rehab Medication Review by a Pharmacist  Does the patient feel that his/her medications are working for him/her?  yes  Has the patient been experiencing any side effects to the medications prescribed?  Yes, some bruising from Plavix but not enough to stop therapy at this time.  Does the patient measure his/her own blood pressure or blood glucose at home?  yes (blood sugar 1-3 times per day)  Does the patient have any problems obtaining medications due to transportation or finances?   no  Understanding of regimen: good Understanding of indications: good Potential of compliance: good  Pharmacist comments: Good understanding of regimen.  Would prefer not to bruise as easily from Plavix.  Pricilla Larsson 05/06/2015 1:59 PM

## 2015-05-06 NOTE — Patient Instructions (Signed)
Pt has finished orientation and is scheduled to return to CR on May 11, 2015 at 0930. Pt has been instructed to arrive to class 15 minutes early for scheduled class. Pt has been instructed to wear comfortable clothing and shoes with rubber soles. Pt has been told to take their medications 1 hour prior to coming to class.  If the patient is not going to attend class, she has been instructed to call.

## 2015-05-11 ENCOUNTER — Encounter (HOSPITAL_COMMUNITY)
Admission: RE | Admit: 2015-05-11 | Discharge: 2015-05-11 | Disposition: A | Discharge status: Home / Self Care | Payer: Commercial Managed Care - HMO | Source: Ambulatory Visit | Attending: Cardiology | Admitting: Cardiology

## 2015-05-11 DIAGNOSIS — Z952 Presence of prosthetic heart valve: Secondary | ICD-10-CM | POA: Diagnosis not present

## 2015-05-13 ENCOUNTER — Encounter (HOSPITAL_COMMUNITY): Payer: Commercial Managed Care - HMO

## 2015-05-15 ENCOUNTER — Encounter (HOSPITAL_COMMUNITY)
Admission: RE | Admit: 2015-05-15 | Discharge: 2015-05-15 | Disposition: A | Discharge status: Home / Self Care | Payer: Commercial Managed Care - HMO | Source: Ambulatory Visit | Attending: Cardiology | Admitting: Cardiology

## 2015-05-15 DIAGNOSIS — Z952 Presence of prosthetic heart valve: Secondary | ICD-10-CM | POA: Diagnosis not present

## 2015-05-15 NOTE — Progress Notes (Signed)
Cardiac Rehabilitation Program Outcomes Report   Orientation:  05/06/15 Graduate Date:  tbd Discharge Date:  tbd # of sessions completed: 3  Cardiologist: [name] Family MD:  Odis Luster Time:  0930  A.  Exercise Program:  Tolerates exercise @ 3.57 METS for 15 minutes and Walk Test Results:  Pre: 2.45 mets  B.  Mental Health:  Good mental attitude PHQ9 score = 1  C.  Education/Instruction/Skills  Accurately checks own pulse.  Rest:  73  Exercise:  141  Uses Perceived Exertion Scale and/or Dyspnea Scale  D.  Nutrition/Weight Control/Body Composition:  Adherence to prescribed nutrition program: poor   E.  Blood Lipids    Lab Results  Component Value Date   CHOL 189 03/11/2015   HDL 83 03/11/2015   LDLCALC 94 03/11/2015   TRIG 58 03/11/2015   CHOLHDL 2.3 03/11/2015    F.  Lifestyle Changes:  Making positive lifestyle changes  G.  Symptoms noted with exercise:  Fatigue, patient became fatigued today at 8 minutes of TM. BP high with 141 HR.  Patient WNL after resting.   Report Completed By:  Stevphen Rochester RN   Comments:  This is the patients first week progress note for [facility].

## 2015-05-18 ENCOUNTER — Encounter (HOSPITAL_COMMUNITY)
Admission: RE | Admit: 2015-05-18 | Discharge: 2015-05-18 | Disposition: A | Discharge status: Home / Self Care | Payer: Commercial Managed Care - HMO | Source: Ambulatory Visit | Attending: Cardiology | Admitting: Cardiology

## 2015-05-18 DIAGNOSIS — I350 Nonrheumatic aortic (valve) stenosis: Secondary | ICD-10-CM | POA: Insufficient documentation

## 2015-05-18 DIAGNOSIS — Z952 Presence of prosthetic heart valve: Secondary | ICD-10-CM | POA: Diagnosis not present

## 2015-05-19 ENCOUNTER — Telehealth: Payer: Self-pay | Admitting: *Deleted

## 2015-05-19 ENCOUNTER — Other Ambulatory Visit: Payer: Self-pay | Admitting: Family Medicine

## 2015-05-19 DIAGNOSIS — M25562 Pain in left knee: Secondary | ICD-10-CM

## 2015-05-19 NOTE — Telephone Encounter (Signed)
Patient called requesting a referral to Dr. Aline Brochure for her left knee, Patient calld Dr. Aline Brochure and they told her she will need a referral and they will then schedule her once they receive the referral. Please advise

## 2015-05-19 NOTE — Telephone Encounter (Signed)
Referral entered  

## 2015-05-20 ENCOUNTER — Encounter (HOSPITAL_COMMUNITY): Payer: Commercial Managed Care - HMO

## 2015-05-22 ENCOUNTER — Encounter (HOSPITAL_COMMUNITY): Payer: Commercial Managed Care - HMO

## 2015-05-25 ENCOUNTER — Encounter (HOSPITAL_COMMUNITY): Payer: Commercial Managed Care - HMO

## 2015-05-25 ENCOUNTER — Other Ambulatory Visit: Payer: Self-pay | Admitting: Surgical

## 2015-05-25 ENCOUNTER — Other Ambulatory Visit: Payer: Self-pay | Admitting: Family Medicine

## 2015-05-27 ENCOUNTER — Encounter (HOSPITAL_COMMUNITY): Payer: Commercial Managed Care - HMO

## 2015-05-29 ENCOUNTER — Encounter (HOSPITAL_COMMUNITY): Payer: Commercial Managed Care - HMO

## 2015-06-01 ENCOUNTER — Encounter (HOSPITAL_COMMUNITY): Payer: Commercial Managed Care - HMO

## 2015-06-03 ENCOUNTER — Encounter (HOSPITAL_COMMUNITY): Payer: Commercial Managed Care - HMO

## 2015-06-04 ENCOUNTER — Ambulatory Visit (INDEPENDENT_AMBULATORY_CARE_PROVIDER_SITE_OTHER): Payer: Commercial Managed Care - HMO

## 2015-06-04 ENCOUNTER — Ambulatory Visit (INDEPENDENT_AMBULATORY_CARE_PROVIDER_SITE_OTHER): Payer: Commercial Managed Care - HMO | Admitting: Orthopedic Surgery

## 2015-06-04 ENCOUNTER — Telehealth: Payer: Self-pay | Admitting: Family Medicine

## 2015-06-04 VITALS — BP 124/77 | Ht 66.0 in | Wt 184.5 lb

## 2015-06-04 DIAGNOSIS — M1712 Unilateral primary osteoarthritis, left knee: Secondary | ICD-10-CM | POA: Diagnosis not present

## 2015-06-04 DIAGNOSIS — M25562 Pain in left knee: Secondary | ICD-10-CM

## 2015-06-04 NOTE — Telephone Encounter (Signed)
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 Suggested looking at statin use as far as the alopecia was concerned Will address at next visit

## 2015-06-04 NOTE — Patient Instructions (Signed)
No treadmill at therapy    Joint Injection Care After Refer to this sheet in the next few days. These instructions provide you with information on caring for yourself after you have had a joint injection. Your caregiver also may give you more specific instructions. Your treatment has been planned according to current medical practices, but problems sometimes occur. Call your caregiver if you have any problems or questions after your procedure. After any type of joint injection, it is not uncommon to experience:  Soreness, swelling, or bruising around the injection site.  Mild numbness, tingling, or weakness around the injection site caused by the numbing medicine used before or with the injection. It also is possible to experience the following effects associated with the specific agent after injection:  Iodine-based contrast agents:  Allergic reaction (itching, hives, widespread redness, and swelling beyond the injection site).  Corticosteroids (These effects are rare.):  Allergic reaction.  Increased blood sugar levels (If you have diabetes and you notice that your blood sugar levels have increased, notify your caregiver).  Increased blood pressure levels.  Mood swings.  Hyaluronic acid in the use of viscosupplementation.  Temporary heat or redness.  Temporary rash and itching.  Increased fluid accumulation in the injected joint. These effects all should resolve within a day after your procedure.  HOME CARE INSTRUCTIONS  Limit yourself to light activity the day of your procedure. Avoid lifting heavy objects, bending, stooping, or twisting.  Take prescription or over-the-counter pain medication as directed by your caregiver.  You may apply ice to your injection site to reduce pain and swelling the day of your procedure. Ice may be applied 03-04 times:  Put ice in a plastic bag.  Place a towel between your skin and the bag.  Leave the ice on for no longer than 15-20  minutes each time. SEEK IMMEDIATE MEDICAL CARE IF:   Pain and swelling get worse rather than better or extend beyond the injection site.  Numbness does not go away.  Blood or fluid continues to leak from the injection site.  You have chest pain.  You have swelling of your face or tongue.  You have trouble breathing or you become dizzy.  You develop a fever, chills, or severe tenderness at the injection site that last longer than 1 day. MAKE SURE YOU:  Understand these instructions.  Watch your condition.  Get help right away if you are not doing well or if you get worse. Document Released: 04/14/2011 Document Revised: 10/24/2011 Document Reviewed: 04/14/2011 [facility] Patient Information 2015 [place], [place]. This information is not intended to replace advice given to you by your health care provider. Make sure you discuss any questions you have with your health care provider.

## 2015-06-04 NOTE — Progress Notes (Signed)
Patient ID: [name] [name], female   DOB: 1946/04/08, 69 y.o.   MRN: [id number]  Chief Complaint  Patient presents with  . Knee Pain    left knee pain, no known injury, referred by Dr Moshe Cipro    HPI [name] [name] is a 69 y.o. female. She presents for evaluation of left knee pain times several years. She basically has 7 out of 10 activity related stabbing medial left knee pain unrelieved by regular Tylenol no history of recent trauma. She had a recent valve surgery occasional back pain seasonal allergies mild ankle leg edema dental issues all of her teeth are gone but otherwise normal review of systems. Her valve surgery was in August.  Review of Systems Review of Systems  Past Medical History  Diagnosis Date  . Essential hypertension, benign   . Hyperlipidemia   . Anxiety   . Aortic stenosis   . Symptomatic PVCs   . Nonischemic cardiomyopathy     No significant obstructive CAD at heart catheterization 05/2014, LVEF 20%  . CKD (chronic kidney disease) stage 3, GFR 30-59 ml/min   . Chronic systolic heart failure   . Obesity   . Depression   . Constipation   . Incidental pulmonary nodule, > 25mm and < 13mm     7 x 4 mm LLL nodule  . Arthritis   . LBBB (left bundle branch block)   . Pneumonia 2013  . Diabetes mellitus, type 2   . CHF (congestive heart failure)   . 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 Cesarean section  1987  . Multiple extractions with alveoloplasty N/A 02/27/2014    Procedure: Extraction of tooth #'s 2,4,5,6,7,8,9,10,11,12, 22, 23, 24, 25, 26, 28 with alveoloplasty and bilateral mandibular tori reductions;  Surgeon: [name], DDS;  Location: [facility];  Service: Oral Surgery;  Laterality: N/A;  . Tubal ligation  1987  . Video bronchoscopy Bilateral 06/05/2014    Procedure: VIDEO  BRONCHOSCOPY WITHOUT FLUORO;  Surgeon: [name], MD;  Location: [facility];  Service: Cardiopulmonary;  Laterality: Bilateral;  . Left and right heart catheterization with coronary angiogram N/A 12/05/2011    Procedure: LEFT AND RIGHT HEART CATHETERIZATION WITH CORONARY ANGIOGRAM;  Surgeon: [name], MD;  Location: [facility] CATH LAB;  Service: Cardiovascular;  Laterality: N/A;  . Left and right heart catheterization with coronary angiogram N/A 05/23/2014    Procedure: LEFT AND RIGHT HEART CATHETERIZATION WITH CORONARY ANGIOGRAM;  Surgeon: [name], MD;  Location: [facility] CATH LAB;  Service: Cardiovascular;  Laterality: N/A;  . Cardiac catheterization    . Flexible bronchoscopy N/A 01/14/2015    Procedure: FLEXIBLE BRONCHOSCOPY;  Surgeon: [name], MD;  Location: [facility];  Service: Thoracic;  Laterality: N/A;  . Rigid bronchoscopy N/A 01/14/2015    Procedure: RIGID BRONCHOSCOPY with Removal Of Foreign Body ;  Surgeon: [name], MD;  Location: [facility];  Service: Thoracic;  Laterality: N/A;  . Transcatheter aortic valve replacement, transfemoral Bilateral 03/24/2015    Procedure: TRANSCATHETER AORTIC VALVE REPLACEMENT, TRANSFEMORAL;  Surgeon: [name], MD;  Location: [place];  Service: Open Heart Surgery;  Laterality: Bilateral;  . Tee without cardioversion N/A 03/24/2015    Procedure: TRANSESOPHAGEAL ECHOCARDIOGRAM (TEE);  Surgeon: [name], MD;  Location: [place];  Service: Open Heart Surgery;  Laterality: N/A;    Family History  Problem Relation Age of Onset  . Heart disease Mother   . Heart attack Mother 60  . Diabetes Mother   . Hypertension Mother   . Colon cancer Father 20  . Breast cancer Sister   . Diabetes Brother   . Hypertension Brother   . Stroke Brother     Social History Social History  Substance Use Topics  . Smoking status: Never Smoker   . Smokeless tobacco: Never Used  . Alcohol Use: No    Allergies   Allergen Reactions  . Penicillins Other (See Comments)    Family unsure of reaction, but certain mom said she was allergic    Current Outpatient Prescriptions  Medication Sig Dispense Refill  . acetaminophen (TYLENOL) 500 MG tablet Take 1,000 mg by mouth every 6 (six) hours as needed for moderate pain.    Marland Kitchen ALPRAZolam (XANAX) 0.25 MG tablet TAKE ONE TABLET BY MOUTH AT BEDTIME AS NEEDED. 15 tablet 0  . aspirin EC 81 MG tablet Take 81 mg by mouth daily.    . carvedilol (COREG) 25 MG tablet Take 25 mg by mouth 2 (two) times daily with a meal.     . clopidogrel (PLAVIX) 75 MG tablet Take 1 tablet (75 mg total) by mouth daily with breakfast. 30 tablet 1  . CRESTOR 40 MG tablet Take 40 mg by mouth daily.     Regino Schultze TEST test strip Check blood sugar three times daily dx E10.9 100 each 5  . fenofibrate (TRICOR) 145 MG tablet Take 1 tablet (145 mg total) by mouth daily. 30 tablet 3  . furosemide (LASIX) 40 MG tablet Take 40 mg by mouth daily.     Marland Kitchen glipiZIDE (GLUCOTROL XL) 5 MG 24 hr tablet TAKE 1 TABLET BY MOUTH DAILY WITH BREAKFAST. 30 tablet 3  . HUMALOG MIX 50/50 KWIKPEN (50-50) 100 UNIT/ML Kwikpen Inject 12 Units into the skin 2 (two) times daily.     . Multiple Vitamins-Minerals (MULTIVITAMIN PO) Take 1 tablet by mouth daily.    . niacin (NIASPAN) 1000 MG CR tablet TAKE (1) TABLET BY MOUTH AT BEDTIME. 30 tablet 0  . potassium chloride SA (K-DUR,KLOR-CON) 10 MEQ tablet Take 1 tablet (10 mEq total) by mouth daily. 30 tablet 1  . spironolactone (ALDACTONE) 25 MG tablet Take 25 mg by mouth daily.     . traMADol (ULTRAM) 50 MG tablet Take 1-2 tablets (50-100 mg total) by mouth every 6 (six) hours as needed for moderate pain. 40 tablet 0  . UNIFINE PENTIPS 31G X 8 MM MISC USE AS NEEDED TO INJECT INSULIN. 100 each 5  . [DISCONTINUED] cetirizine (ZYRTEC) 10 MG tablet Take 1 tablet (10 mg total) by mouth daily. 30 tablet 3  . [DISCONTINUED] ferrous sulfate 325 (65 FE) MG EC tablet Take 1 tablet  (325 mg total) by mouth 2 (two) times daily. 60 tablet 5  . [DISCONTINUED] FLUoxetine (PROZAC) 10 MG tablet Take 1 tablet (10 mg total) by mouth daily. Take one capsule by mouth once a day 30 tablet 3  . [DISCONTINUED] fluticasone (FLONASE) 50 MCG/ACT nasal spray Place 2 sprays into the nose daily. 16 g 3   No current facility-administered medications for this visit.       Physical Exam Physical Exam Blood pressure 124/77, height 5\' 6"  (1.676 m), weight 184 lb 8.5 oz (83.702 kg). Appearance, there are no abnormalities in terms of appearance the patient was well-developed and well-nourished. The grooming  and hygiene were normal.  Mental status orientation, there was normal alertness and orientation Mood pleasant Ambulatory status normal with no assistive devices  Examination of the left knee Inspection mild varus deformity Range of motion 125 but painful Tests for stability were normal Motor strength  quadriceps hamstring grade 5 out of 5 Medial joint line tenderness no effusion Skin warm dry and intact without laceration or ulceration or erythema Neurologic examination normal sensation Vascular examination normal pulses with warm extremity and normal capillary refill  The opposite knee reveals no swelling or effusion, full range of motion, normal muscle tone, anterior and posterior drawer test stable neurovascular exam intact  Data Reviewed I ordered some plain films in the office today and I interpreted those as varus arthritis medial compartment grade 4 changes secondary bone changes as well  Assessment  Her pain is moderate her functional activity is very good. She just had valve surgery. Recommend injection come back in 6 months assess her situation again   Plan  Procedure note left knee injection verbal consent was obtained to inject left knee joint  Timeout was completed to confirm the site of injection  The medications used were 40 mg of Depo-Medrol and 1%  lidocaine 3 cc  Anesthesia was provided by ethyl chloride and the skin was prepped with alcohol.  After cleaning the skin with alcohol a 20-gauge needle was used to inject the left knee joint. There were no complications. A sterile bandage was applied.   I've advised her not to use a treadmill for cardiac rehabilitation but to use the upper extremity bike and the exercise bicycle.

## 2015-06-05 ENCOUNTER — Encounter (HOSPITAL_COMMUNITY): Payer: Commercial Managed Care - HMO

## 2015-06-08 ENCOUNTER — Encounter (HOSPITAL_COMMUNITY): Payer: Commercial Managed Care - HMO

## 2015-06-10 ENCOUNTER — Encounter (HOSPITAL_COMMUNITY): Payer: Commercial Managed Care - HMO

## 2015-06-11 ENCOUNTER — Other Ambulatory Visit: Payer: Self-pay | Admitting: Family Medicine

## 2015-06-11 ENCOUNTER — Telehealth: Payer: Self-pay

## 2015-06-11 DIAGNOSIS — Z794 Long term (current) use of insulin: Principal | ICD-10-CM

## 2015-06-11 DIAGNOSIS — R748 Abnormal levels of other serum enzymes: Secondary | ICD-10-CM

## 2015-06-11 DIAGNOSIS — IMO0001 Reserved for inherently not codable concepts without codable children: Secondary | ICD-10-CM

## 2015-06-11 DIAGNOSIS — E785 Hyperlipidemia, unspecified: Secondary | ICD-10-CM

## 2015-06-11 DIAGNOSIS — E119 Type 2 diabetes mellitus without complications: Principal | ICD-10-CM

## 2015-06-11 LAB — HEMOGLOBIN A1C
Hgb A1c MFr Bld: 8 % — ABNORMAL HIGH (ref <5.7)
Mean Plasma Glucose: 183 mg/dL — ABNORMAL HIGH (ref <117)

## 2015-06-11 LAB — BASIC METABOLIC PANEL WITH GFR
BUN: 33 mg/dL — ABNORMAL HIGH (ref 7–25)
CO2: 26 mmol/L (ref 20–31)
Calcium: 9.5 mg/dL (ref 8.6–10.4)
Chloride: 104 mmol/L (ref 98–110)
Creat: 1.79 mg/dL — ABNORMAL HIGH (ref 0.50–0.99)
GFR, Est African American: 33 mL/min — ABNORMAL LOW (ref >=60)
GFR, Est Non African American: 29 mL/min — ABNORMAL LOW (ref >=60)
Glucose, Bld: 168 mg/dL — ABNORMAL HIGH (ref 65–99)
Potassium: 4.1 mmol/L (ref 3.5–5.3)
Sodium: 137 mmol/L (ref 135–146)

## 2015-06-11 NOTE — Telephone Encounter (Signed)
Labs ordered.

## 2015-06-11 NOTE — Telephone Encounter (Signed)
pls change from BMP to CMP and add CPK , to see if cholesterol med is causing muscle pain or bothering liver, advise will seee what lab results show, also add magnesium level See if she describes muscle spasms, may consider short term muscle relaxer

## 2015-06-12 ENCOUNTER — Encounter (HOSPITAL_COMMUNITY): Payer: Commercial Managed Care - HMO

## 2015-06-12 LAB — HEPATIC FUNCTION PANEL
ALT: 18 U/L (ref 6–29)
AST: 23 U/L (ref 10–35)
Albumin: 4.2 g/dL (ref 3.6–5.1)
Alkaline Phosphatase: 43 U/L (ref 33–130)
Bilirubin, Direct: 0.1 mg/dL (ref <=0.2)
Indirect Bilirubin: 0.3 mg/dL (ref 0.2–1.2)
Total Bilirubin: 0.4 mg/dL (ref 0.2–1.2)
Total Protein: 7 g/dL (ref 6.1–8.1)

## 2015-06-12 LAB — CK: Total CK: 436 U/L — ABNORMAL HIGH (ref 7–177)

## 2015-06-12 LAB — MAGNESIUM: Magnesium: 2.1 mg/dL (ref 1.5–2.5)

## 2015-06-12 NOTE — Telephone Encounter (Signed)
Labs added.  Will await results.  

## 2015-06-12 NOTE — Addendum Note (Signed)
Addended by: Denman George B on: 06/12/2015 04:50 PM   Modules accepted: Orders

## 2015-06-15 ENCOUNTER — Encounter (HOSPITAL_COMMUNITY): Payer: Commercial Managed Care - HMO

## 2015-06-15 NOTE — Addendum Note (Signed)
Encounter addended by: Cathie Olden, RN on: 06/15/2015 12:52 PM<BR>     Documentation filed: Notes Section

## 2015-06-15 NOTE — Progress Notes (Signed)
06/12/15. Patient is discharged from [facility] today, June 12, 2015 with 4 sessions.  Patient had low attendance. Last date in class was 05/18/15

## 2015-06-17 ENCOUNTER — Encounter (HOSPITAL_COMMUNITY): Payer: Medicare HMO

## 2015-06-19 ENCOUNTER — Encounter (HOSPITAL_COMMUNITY): Payer: Medicare HMO

## 2015-06-22 ENCOUNTER — Encounter (HOSPITAL_COMMUNITY): Payer: Medicare HMO

## 2015-06-24 ENCOUNTER — Encounter (HOSPITAL_COMMUNITY): Payer: Medicare HMO

## 2015-06-25 ENCOUNTER — Other Ambulatory Visit: Payer: Self-pay

## 2015-06-25 MED ORDER — HUMALOG MIX 50/50 KWIKPEN (50-50) 100 UNIT/ML ~~LOC~~ SUPN: 12.0000 [IU] | PEN_INJECTOR | Freq: Two times a day (BID) | SUBCUTANEOUS | Status: DC

## 2015-06-26 ENCOUNTER — Encounter (HOSPITAL_COMMUNITY): Payer: Medicare HMO

## 2015-06-26 ENCOUNTER — Other Ambulatory Visit: Payer: Self-pay | Admitting: Family Medicine

## 2015-06-26 LAB — CK: Total CK: 203 U/L — ABNORMAL HIGH (ref 7–177)

## 2015-06-26 MED ORDER — EZETIMIBE 10 MG PO TABS: 10.0000 mg | ORAL_TABLET | Freq: Every day | ORAL | Status: DC

## 2015-06-26 NOTE — Addendum Note (Signed)
Addended by: Denman George B on: 06/26/2015 03:56 PM   Modules accepted: Orders

## 2015-06-29 ENCOUNTER — Other Ambulatory Visit: Payer: Self-pay | Admitting: Family Medicine

## 2015-06-29 ENCOUNTER — Encounter (HOSPITAL_COMMUNITY): Payer: Medicare HMO

## 2015-06-29 NOTE — Telephone Encounter (Signed)
Patient is calling requesting refills on clopidogrel (PLAVIX) 75 MG tablet and potassium chloride SA (K-DUR,KLOR-CON) 10 MEQ tablet (Dr. Girtha Rm prescribes Potassium) please advise?

## 2015-07-01 ENCOUNTER — Encounter (HOSPITAL_COMMUNITY): Payer: Medicare HMO

## 2015-07-03 ENCOUNTER — Encounter (HOSPITAL_COMMUNITY): Payer: Medicare HMO

## 2015-07-06 ENCOUNTER — Encounter (HOSPITAL_COMMUNITY): Payer: Medicare HMO

## 2015-07-08 ENCOUNTER — Encounter (HOSPITAL_COMMUNITY): Payer: Medicare HMO

## 2015-07-10 ENCOUNTER — Encounter (HOSPITAL_COMMUNITY): Payer: Medicare HMO

## 2015-07-13 ENCOUNTER — Encounter (HOSPITAL_COMMUNITY): Payer: Medicare HMO

## 2015-07-14 ENCOUNTER — Other Ambulatory Visit: Payer: Self-pay | Admitting: Family Medicine

## 2015-07-15 ENCOUNTER — Other Ambulatory Visit: Payer: Self-pay

## 2015-07-15 ENCOUNTER — Encounter (HOSPITAL_COMMUNITY): Payer: Medicare HMO

## 2015-07-15 MED ORDER — ALPRAZOLAM 0.25 MG PO TABS: ORAL_TABLET | ORAL | Status: DC

## 2015-07-17 ENCOUNTER — Encounter (HOSPITAL_COMMUNITY): Payer: Medicare HMO

## 2015-07-20 ENCOUNTER — Encounter (HOSPITAL_COMMUNITY): Payer: Medicare HMO

## 2015-07-22 ENCOUNTER — Encounter (HOSPITAL_COMMUNITY): Payer: Medicare HMO

## 2015-07-24 ENCOUNTER — Encounter (HOSPITAL_COMMUNITY): Payer: Medicare HMO

## 2015-07-27 ENCOUNTER — Encounter (HOSPITAL_COMMUNITY): Payer: Medicare HMO

## 2015-07-28 ENCOUNTER — Other Ambulatory Visit: Payer: Self-pay | Admitting: Cardiology

## 2015-07-28 ENCOUNTER — Other Ambulatory Visit: Payer: Self-pay | Admitting: Family Medicine

## 2015-07-29 ENCOUNTER — Encounter (HOSPITAL_COMMUNITY): Payer: Medicare HMO

## 2015-07-30 ENCOUNTER — Ambulatory Visit (INDEPENDENT_AMBULATORY_CARE_PROVIDER_SITE_OTHER): Payer: Commercial Managed Care - HMO | Admitting: Cardiology

## 2015-07-30 ENCOUNTER — Encounter: Payer: Self-pay | Admitting: Cardiology

## 2015-07-30 VITALS — BP 104/66 | HR 70 | Ht 65.0 in | Wt 184.0 lb

## 2015-07-30 DIAGNOSIS — N183 Chronic kidney disease, stage 3 unspecified: Secondary | ICD-10-CM

## 2015-07-30 DIAGNOSIS — I429 Cardiomyopathy, unspecified: Secondary | ICD-10-CM | POA: Diagnosis not present

## 2015-07-30 DIAGNOSIS — I428 Other cardiomyopathies: Secondary | ICD-10-CM

## 2015-07-30 DIAGNOSIS — Z954 Presence of other heart-valve replacement: Secondary | ICD-10-CM

## 2015-07-30 DIAGNOSIS — Z952 Presence of prosthetic heart valve: Secondary | ICD-10-CM

## 2015-07-30 DIAGNOSIS — I447 Left bundle-branch block, unspecified: Secondary | ICD-10-CM | POA: Diagnosis not present

## 2015-07-30 LAB — COMPLETE METABOLIC PANEL WITH GFR
ALT: 23 U/L (ref 6–29)
AST: 25 U/L (ref 10–35)
Albumin: 4.1 g/dL (ref 3.6–5.1)
Alkaline Phosphatase: 46 U/L (ref 33–130)
BUN: 32 mg/dL — ABNORMAL HIGH (ref 7–25)
CO2: 27 mmol/L (ref 20–31)
Calcium: 9.9 mg/dL (ref 8.6–10.4)
Chloride: 105 mmol/L (ref 98–110)
Creat: 1.69 mg/dL — ABNORMAL HIGH (ref 0.60–0.93)
GFR, Est African American: 35 mL/min — ABNORMAL LOW (ref >=60)
GFR, Est Non African American: 30 mL/min — ABNORMAL LOW (ref >=60)
Glucose, Bld: 139 mg/dL — ABNORMAL HIGH (ref 65–99)
Potassium: 4.3 mmol/L (ref 3.5–5.3)
Sodium: 142 mmol/L (ref 135–146)
Total Bilirubin: 0.4 mg/dL (ref 0.2–1.2)
Total Protein: 7.2 g/dL (ref 6.1–8.1)

## 2015-07-30 LAB — LIPID PANEL
Cholesterol: 227 mg/dL — ABNORMAL HIGH (ref 125–200)
HDL: 78 mg/dL (ref >=46)
LDL Cholesterol: 135 mg/dL — ABNORMAL HIGH (ref <130)
Total CHOL/HDL Ratio: 2.9 Ratio (ref <=5.0)
Triglycerides: 70 mg/dL (ref <150)
VLDL: 14 mg/dL (ref <30)

## 2015-07-30 LAB — CK: Total CK: 244 U/L — ABNORMAL HIGH (ref 7–177)

## 2015-07-30 MED ORDER — CLOPIDOGREL BISULFATE 75 MG PO TABS
75.0000 mg | ORAL_TABLET | Freq: Every day | ORAL | Status: DC
Start: 2015-07-30 — End: 2016-05-17

## 2015-07-30 NOTE — Progress Notes (Signed)
Cardiology Office Note  Date: 07/30/2015   ID: [name], [name] 1946/01/07, MRN [id number]  PCP: [name], MD  Primary Cardiologist: Rozann Lesches, MD   Chief Complaint  Patient presents with  . Aortic Stenosis  . Status post TAVR    History of Present Illness: [name] [name] is a 69 y.o. female last seen in July. She has had interval follow-up with Dr. Roxy Manns and also Dr. [name]. She underwent TAVR in August for management of severe aortic stensosis, has generally done well since that time.  She tells me at she is taking care of her household chores, NYHA class II dyspnea, no angina or palpitations.  We reviewed medications which are outlined below. She is on aspirin, Plavix, Coreg, TriCor, Lasix, KCL, and Aldactone. She has not been on ARB with prior renal insufficiency.  Follow-up echocardiogram in September is reviewed below showing stable bioprosthetic aortic valve function, LVEF 20% with known nonischemic cardiomyopathy.  Today we discussed her cardiomyopathy is a relates to considerations for defibrillator implantation. I am hopeful that she will have further improvement in LVEF now that her aortic valve has been fixed, but this may not be the case. She is somewhat ambivalent about a defibrillator at this point.  Past Medical History  Diagnosis Date  . Essential hypertension, benign   . Hyperlipidemia   . Anxiety   . Aortic stenosis   . Symptomatic PVCs   . Nonischemic cardiomyopathy (HCC)     No significant obstructive CAD at heart catheterization 05/2014, LVEF 20%  . CKD (chronic kidney disease) stage 3, GFR 30-59 ml/min   . Chronic systolic heart failure ([place])   . Obesity   . Depression   . Constipation   . Incidental pulmonary nodule, > 14mm and < 87mm     7 x 4 mm LLL nodule  . Arthritis   . LBBB (left bundle branch block)   . Pneumonia 2013  . Diabetes mellitus, type 2 ([place])   . 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 acetaminophen (TYLENOL) 500 MG tablet Take 1,000 mg by mouth every 6 (six) hours as needed for moderate pain.    Marland Kitchen ALPRAZolam (XANAX) 0.25 MG tablet TAKE ONE TABLET BY MOUTH AT BEDTIME AS NEEDED. 15 tablet 0  . aspirin EC 81 MG tablet Take 81 mg by mouth daily.    . carvedilol (COREG) 25 MG tablet Take 25 mg by mouth 2 (two) times daily with a meal.     . clopidogrel (PLAVIX) 75 MG tablet Take 1 tablet (75 mg total) by mouth daily with breakfast. 30 tablet 6  . EASYMAX TEST test strip Check blood sugar three times daily dx E10.9 100 each 5  . fenofibrate (TRICOR) 145 MG tablet Take 1 tablet (145 mg total) by mouth daily. 30 tablet 3  . furosemide (LASIX) 40 MG tablet TAKE 1 TABLET BY MOUTH EVERY MORNING. 30 tablet 6  . glipiZIDE (GLUCOTROL XL) 5 MG 24 hr tablet TAKE 1 TABLET BY MOUTH DAILY WITH BREAKFAST. 30 tablet 3  . HUMALOG MIX 50/50 KWIKPEN (50-50) 100 UNIT/ML Kwikpen Inject 12 Units into the skin 2 (two) times daily. 15 mL 5  . Multiple Vitamins-Minerals (MULTIVITAMIN PO) Take 1 tablet by mouth daily.    . niacin (NIASPAN) 1000 MG CR tablet TAKE (1)  TABLET BY MOUTH AT BEDTIME. 30 tablet 2  . potassium chloride (K-DUR) 10 MEQ tablet TAKE ONE TABLET BY MOUTH DAILY. DO NOT TAKE IF NOT TAKING FUROSEMIDE. 30 tablet 2  . spironolactone (ALDACTONE) 25 MG tablet TAKE 1 TABLET BY MOUTH ONCE DAILY. 90 tablet 3  . UNIFINE PENTIPS 31G X 8 MM MISC USE AS NEEDED TO INJECT INSULIN. 100 each 5  . ZETIA 10 MG tablet TAKE ONE TABLET BY MOUTH ONCE DAILY. 30 tablet 2  . [DISCONTINUED] cetirizine (ZYRTEC) 10 MG tablet Take 1 tablet (10 mg total) by mouth daily. 30 tablet 3  . [DISCONTINUED] ferrous sulfate 325 (65 FE) MG EC tablet Take 1 tablet (325 mg total) by mouth 2 (two) times daily. 60 tablet 5  . [DISCONTINUED] FLUoxetine (PROZAC) 10 MG tablet  Take 1 tablet (10 mg total) by mouth daily. Take one capsule by mouth once a day 30 tablet 3  . [DISCONTINUED] fluticasone (FLONASE) 50 MCG/ACT nasal spray Place 2 sprays into the nose daily. 16 g 3   No current facility-administered medications for this visit.   Allergies:  Crestor and Penicillins   Social History: The patient  reports that she has never smoked. She has never used smokeless tobacco. She reports that she does not drink alcohol or use illicit drugs.   ROS:  Please see the history of present illness. Otherwise, complete review of systems is positive for fatigue, although she does push herself to get things done around the house.  All other systems are reviewed and negative.   Physical Exam: VS:  BP 104/66 mmHg  Pulse 70  Ht 5\' 5"  (1.651 m)  Wt 184 lb (83.462 kg)  BMI 30.62 kg/m2  SpO2 99%, BMI Body mass index is 30.62 kg/(m^2).  Wt Readings from Last 3 Encounters:  07/30/15 184 lb (83.462 kg)  06/04/15 184 lb 8.5 oz (83.702 kg)  05/06/15 184 lb 8.4 oz (83.7 kg)    Overweight woman, in no distress  HEENT: Conjunctiva and lids normal, oropharynx clear.  Neck: Supple, no carotid bruits, no thyromegaly.  Lungs: Clear to auscultation, nonlabored breathing at rest.  Cardiac: Regular rate and rhythm, indistinct PMI, no S3, 3/6 systolic murmur, no pericardial rub.  Abdomen: Soft, nontender, bowel sounds present, no guarding or rebound.  Extremities: Trace edema, distal pulses 2+.   ECG: ECG from August showed sinus rhythm with left bundle branch block.  Recent Labwork: 09/12/2014: TSH 2.455 03/26/2015: Hemoglobin 11.0*; Platelets 89* 06/11/2015: Magnesium 2.1 07/29/2015: ALT 23; AST 25; BUN 32*; Creat 1.69*; Potassium 4.3; Sodium 142     Component Value Date/Time   CHOL 227* 07/29/2015 0841   TRIG 70 07/29/2015 0841   HDL 78 07/29/2015 0841   CHOLHDL 2.9 07/29/2015 0841   VLDL 14 07/29/2015 0841   LDLCALC 135* 07/29/2015 0841    Other Studies Reviewed  Today:  Echocardiogram 04/29/2015: Study Conclusions  - Left ventricle: The cavity size was mildly dilated. Wall thickness was normal. Septal akinesis, inferior akinesis, anterior severe hypokinesis. The estimated ejection fraction was 20%. Doppler parameters are consistent with abnormal left ventricular relaxation (grade 1 diastolic dysfunction). - Aortic valve: Bioprosthetic aortic valve s/p TAVR. No significant stenosis or regurgitation. Mean gradient (S): 13 mm Hg. - Mitral valve: Mildly calcified annulus. There was trivial regurgitation. - Left atrium: The atrium was mildly dilated. - Right ventricle: The cavity size was normal. Systolic function was normal. - Tricuspid valve: Peak RV-RA gradient (S): 30 mm Hg. - Pulmonary arteries: PA peak pressure:  33 mm Hg (S). - Inferior vena cava: The vessel was normal in size. The respirophasic diameter changes were in the normal range (= 50%), consistent with normal central venous pressure.  Impressions:  - Mildly dilated LV with EF 20%. Wall motion abnormalities as noted above. Normal RV size and systolic function. Bioprosthetic aortic valve s/p TAVR, valve is functioning normally.  Assessment and Plan:  1. Severe low gradient aortic stenosis status post TAVR in August. Bioprosthetic function was within normal limits by echocardiogram in September. She continues on aspirin and Plavix.  2. Nonischemic myopathy with LVEF approximately 20%. Continue medical therapy and follow-up with echocardiogram around the time of her next visit. We did touch on discussion about whether she would want to consider a defibrillator or not, at this point undecided.  3. Chronic left bundle branch block.  4. CK D stage 3, recent creatinine 1.7.  Current medicines were reviewed with the patient today.   Orders Placed This Encounter  Procedures  . Echocardiogram    Disposition: FU with me in 3 months.   Signed, [name], MD, [facility] 07/30/2015 10:03 AM    [name] [name]. [address], [place], [name] [address] Phone: [phone]; Fax: [phone]

## 2015-07-30 NOTE — Patient Instructions (Signed)
Your physician recommends that you schedule a follow-up appointment in:  3 months with Dr [name]   Your physician recommends that you continue on your current medications as directed. Please refer to the Current Medication list given to you today.   If you need a refill on your cardiac medications before your next appointment, please call your pharmacy.     Your physician has requested that you have an echocardiogram(1 week BEFORE next visit). Echocardiography is a painless test that uses sound waves to create images of your heart. It provides your doctor with information about the size and shape of your heart and how well your heart's chambers and valves are working. This procedure takes approximately one hour. There are no restrictions for this procedure.   Thank you for choosing [facility] !

## 2015-07-31 ENCOUNTER — Encounter (HOSPITAL_COMMUNITY): Payer: Medicare HMO

## 2015-08-13 ENCOUNTER — Telehealth: Payer: Self-pay | Admitting: Family Medicine

## 2015-08-13 MED ORDER — AZELASTINE HCL 0.1 % NA SOLN: 1.0000 | Freq: Two times a day (BID) | NASAL | Status: DC

## 2015-08-13 MED ORDER — LORATADINE 10 MG PO TABS: 10.0000 mg | ORAL_TABLET | Freq: Every day | ORAL | Status: DC

## 2015-08-13 NOTE — Telephone Encounter (Signed)
Patient is complaining of head being stopped up, coughing up mucos sometimes, denies fever, symptoms x's 3 days, asking for medication, please advise

## 2015-08-13 NOTE — Telephone Encounter (Signed)
Spoke with patient.  She has not tried any otc meds except for Vicks rub because of recent heart surgery.  Please advise.

## 2015-08-13 NOTE — Telephone Encounter (Signed)
Patient aware and medications sent to pharmacy.  

## 2015-08-13 NOTE — Addendum Note (Signed)
Addended by: Denman George B on: 08/13/2015 02:23 PM   Modules accepted: Orders

## 2015-08-13 NOTE — Telephone Encounter (Signed)
pls advise and send in astllin 1  Puff twice daily and also loratidine one daily x 3 month

## 2015-08-18 ENCOUNTER — Encounter: Payer: Self-pay | Admitting: Family Medicine

## 2015-08-18 ENCOUNTER — Ambulatory Visit (INDEPENDENT_AMBULATORY_CARE_PROVIDER_SITE_OTHER): Payer: PPO | Admitting: Family Medicine

## 2015-08-18 VITALS — BP 128/76 | HR 86 | Resp 18 | Ht 65.0 in | Wt 189.0 lb

## 2015-08-18 DIAGNOSIS — E119 Type 2 diabetes mellitus without complications: Secondary | ICD-10-CM

## 2015-08-18 DIAGNOSIS — F32A Depression, unspecified: Secondary | ICD-10-CM

## 2015-08-18 DIAGNOSIS — F4321 Adjustment disorder with depressed mood: Secondary | ICD-10-CM

## 2015-08-18 DIAGNOSIS — I10 Essential (primary) hypertension: Secondary | ICD-10-CM

## 2015-08-18 DIAGNOSIS — Z13820 Encounter for screening for osteoporosis: Secondary | ICD-10-CM

## 2015-08-18 DIAGNOSIS — F329 Major depressive disorder, single episode, unspecified: Secondary | ICD-10-CM | POA: Diagnosis not present

## 2015-08-18 DIAGNOSIS — Z794 Long term (current) use of insulin: Secondary | ICD-10-CM

## 2015-08-18 DIAGNOSIS — Z23 Encounter for immunization: Secondary | ICD-10-CM | POA: Diagnosis not present

## 2015-08-18 DIAGNOSIS — Z1211 Encounter for screening for malignant neoplasm of colon: Secondary | ICD-10-CM

## 2015-08-18 DIAGNOSIS — IMO0001 Reserved for inherently not codable concepts without codable children: Secondary | ICD-10-CM

## 2015-08-18 DIAGNOSIS — Z0000 Encounter for general adult medical examination without abnormal findings: Secondary | ICD-10-CM

## 2015-08-18 DIAGNOSIS — Z1231 Encounter for screening mammogram for malignant neoplasm of breast: Secondary | ICD-10-CM

## 2015-08-18 DIAGNOSIS — E785 Hyperlipidemia, unspecified: Secondary | ICD-10-CM

## 2015-08-18 DIAGNOSIS — Z1159 Encounter for screening for other viral diseases: Secondary | ICD-10-CM

## 2015-08-18 LAB — POC HEMOCCULT BLD/STL (OFFICE/1-CARD/DIAGNOSTIC): Fecal Occult Blood, POC: NEGATIVE

## 2015-08-18 MED ORDER — FUROSEMIDE 40 MG PO TABS: 40.0000 mg | ORAL_TABLET | Freq: Every morning | ORAL | Status: DC

## 2015-08-18 MED ORDER — EZETIMIBE 10 MG PO TABS: 10.0000 mg | ORAL_TABLET | Freq: Every day | ORAL | Status: DC

## 2015-08-18 MED ORDER — SPIRONOLACTONE 25 MG PO TABS: 25.0000 mg | ORAL_TABLET | Freq: Every day | ORAL | Status: DC

## 2015-08-18 MED ORDER — CITALOPRAM HYDROBROMIDE 10 MG PO TABS: 10.0000 mg | ORAL_TABLET | Freq: Every day | ORAL | Status: DC

## 2015-08-18 MED ORDER — NIACIN ER (ANTIHYPERLIPIDEMIC) 1000 MG PO TBCR: EXTENDED_RELEASE_TABLET | ORAL | Status: DC

## 2015-08-18 NOTE — Patient Instructions (Addendum)
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

## 2015-08-18 NOTE — Progress Notes (Signed)
   Subjective:    Patient ID: [name] [name], female    DOB: 1946-02-28, 70 y.o.   MRN: [id number]  HPI Patient is in for annual physical exam. Recent labs, if available are reviewed. Immunization is reviewed , and  updated if needed.    Review of Systems See HPI     Objective:   Physical Exam BP 128/76 mmHg  Pulse 86  Resp 18  Ht 5\' 5"  (1.651 m)  Wt 189 lb (85.73 kg)  BMI 31.45 kg/m2  SpO2 98%  Pleasant well nourished female, alert and oriented x 3, in no cardio-pulmonary distress. Afebrile. HEENT No facial trauma or asymetry. Sinuses non tender.  Extra occullar muscles intact, pupils equally reactive to light. External ears normal, tympanic membranes clear. Oropharynx moist, no exudate, edentulous. Neck: supple, no adenopathy,JVD or thyromegaly.No bruits.  Chest: Clear to ascultation bilaterally.No crackles or wheezes. Non tender to palpation  Breast: No asymetry,no masses or lumps. No tenderness. No nipple discharge or inversion. No axillary or supraclavicular adenopathy  Cardiovascular system; Heart sounds normal,  S1 and  S2 ,no S3.   Systolic murmur Apical beat not displaced Peripheral pulses normal.  Abdomen: Soft, non tender, no organomegaly or masses. No bruits. Bowel sounds normal. No guarding, tenderness or rebound.  Rectal:  Normal sphincter tone. No mass.No rectal masses.  Guaiac negative stool.  GU: External genitalia normal female genitalia , female distribution of hair. No lesions. Urethral meatus normal in size,  no lesions visibly  Present. Bladder non tender. Vagina atrophic  and moist , with no visible lesions ,scant physiologic  discharge present . Adequate pelvic support no  cystocele or rectocele noted Cervix pink and appears healthy, no lesions or ulcerations noted, no discharge noted from os Uterus atrophic no adnexal masses, no cervical motion or adnexal tenderness.   Musculoskeletal exam: Full ROM of spine, hips ,  shoulders and knees. No deformity ,swelling or crepitus noted. No muscle wasting or atrophy.   Neurologic: Cranial nerves 2 to 12 intact. Power, tone ,sensation and reflexes normal throughout. No disturbance in gait. No tremor.  Skin: Intact, no ulceration, erythema , scaling or rash noted. Pigmentation normal throughout  Psych; Depressed  mood and flat  affect. Judgement and concentration normal       Assessment & Plan:  Annual physical exam Annual exam as documented. Counseling done  re healthy lifestyle involving commitment to 150 minutes exercise per week, heart healthy diet, and attaining healthy weight.The importance of adequate sleep also discussed. Regular seat belt use and home safety, is also discussed. Changes in health habits are decided on by the patient with goals and time frames  set for achieving them. Immunization and cancer screening needs are specifically addressed at this visit.   Unresolved grief Grief and depression following loss of ailing spouse over 6 months ago, will benefit both from medication and counseling, pr to start med and she is referred for therapy also. Not suicidal or homicidal  Depression Start citalopram and refer for therapy

## 2015-08-18 NOTE — Assessment & Plan Note (Signed)

## 2015-08-23 ENCOUNTER — Encounter: Payer: Self-pay | Admitting: Family Medicine

## 2015-08-23 DIAGNOSIS — F32A Depression, unspecified: Secondary | ICD-10-CM | POA: Insufficient documentation

## 2015-08-23 DIAGNOSIS — F329 Major depressive disorder, single episode, unspecified: Secondary | ICD-10-CM | POA: Insufficient documentation

## 2015-08-23 NOTE — Assessment & Plan Note (Signed)
Grief and depression following loss of ailing spouse over 6 months ago, will benefit both from medication and counseling, pr to start med and she is referred for therapy also. Not suicidal or homicidal

## 2015-08-23 NOTE — Assessment & Plan Note (Signed)
Start citalopram and refer for therapy

## 2015-09-03 ENCOUNTER — Other Ambulatory Visit: Payer: Self-pay | Admitting: Adult Health

## 2015-09-21 ENCOUNTER — Telehealth (HOSPITAL_COMMUNITY): Payer: Self-pay | Admitting: *Deleted

## 2015-09-22 ENCOUNTER — Encounter: Payer: Self-pay | Admitting: Cardiology

## 2015-10-07 ENCOUNTER — Other Ambulatory Visit: Payer: Self-pay | Admitting: Family Medicine

## 2015-10-07 DIAGNOSIS — Z1159 Encounter for screening for other viral diseases: Secondary | ICD-10-CM | POA: Diagnosis not present

## 2015-10-07 DIAGNOSIS — E119 Type 2 diabetes mellitus without complications: Secondary | ICD-10-CM | POA: Diagnosis not present

## 2015-10-07 DIAGNOSIS — Z794 Long term (current) use of insulin: Secondary | ICD-10-CM | POA: Diagnosis not present

## 2015-10-07 DIAGNOSIS — I10 Essential (primary) hypertension: Secondary | ICD-10-CM | POA: Diagnosis not present

## 2015-10-07 DIAGNOSIS — E559 Vitamin D deficiency, unspecified: Secondary | ICD-10-CM | POA: Diagnosis not present

## 2015-10-08 LAB — BASIC METABOLIC PANEL WITH GFR
BUN: 28 mg/dL — ABNORMAL HIGH (ref 7–25)
CO2: 23 mmol/L (ref 20–31)
Calcium: 9.4 mg/dL (ref 8.6–10.4)
Chloride: 104 mmol/L (ref 98–110)
Creat: 1.82 mg/dL — ABNORMAL HIGH (ref 0.60–0.93)
GFR, Est African American: 32 mL/min — ABNORMAL LOW (ref >=60)
GFR, Est Non African American: 28 mL/min — ABNORMAL LOW (ref >=60)
Glucose, Bld: 208 mg/dL — ABNORMAL HIGH (ref 65–99)
Potassium: 4.2 mmol/L (ref 3.5–5.3)
Sodium: 139 mmol/L (ref 135–146)

## 2015-10-08 LAB — TSH: TSH: 3.88 mIU/L

## 2015-10-08 LAB — HEMOGLOBIN A1C
Hgb A1c MFr Bld: 7.2 % — ABNORMAL HIGH (ref <5.7)
Mean Plasma Glucose: 160 mg/dL — ABNORMAL HIGH (ref <117)

## 2015-10-08 LAB — VITAMIN D 25 HYDROXY (VIT D DEFICIENCY, FRACTURES): Vit D, 25-Hydroxy: 35 ng/mL (ref 30–100)

## 2015-10-08 LAB — HEPATITIS C ANTIBODY: HCV Ab: NEGATIVE

## 2015-10-10 LAB — CK: Total CK: 230 U/L — ABNORMAL HIGH (ref 7–177)

## 2015-10-12 ENCOUNTER — Ambulatory Visit (INDEPENDENT_AMBULATORY_CARE_PROVIDER_SITE_OTHER): Payer: PPO | Admitting: Family Medicine

## 2015-10-12 ENCOUNTER — Other Ambulatory Visit: Payer: Self-pay | Admitting: Family Medicine

## 2015-10-12 ENCOUNTER — Encounter: Payer: Self-pay | Admitting: Family Medicine

## 2015-10-12 VITALS — BP 118/74 | HR 86 | Resp 16 | Ht 65.0 in | Wt 185.0 lb

## 2015-10-12 DIAGNOSIS — I10 Essential (primary) hypertension: Secondary | ICD-10-CM | POA: Diagnosis not present

## 2015-10-12 DIAGNOSIS — N184 Chronic kidney disease, stage 4 (severe): Secondary | ICD-10-CM

## 2015-10-12 DIAGNOSIS — E1322 Other specified diabetes mellitus with diabetic chronic kidney disease: Secondary | ICD-10-CM

## 2015-10-12 DIAGNOSIS — F329 Major depressive disorder, single episode, unspecified: Secondary | ICD-10-CM

## 2015-10-12 DIAGNOSIS — E119 Type 2 diabetes mellitus without complications: Secondary | ICD-10-CM

## 2015-10-12 DIAGNOSIS — E669 Obesity, unspecified: Secondary | ICD-10-CM

## 2015-10-12 DIAGNOSIS — Z794 Long term (current) use of insulin: Secondary | ICD-10-CM

## 2015-10-12 DIAGNOSIS — IMO0001 Reserved for inherently not codable concepts without codable children: Secondary | ICD-10-CM

## 2015-10-12 DIAGNOSIS — E1365 Other specified diabetes mellitus with hyperglycemia: Secondary | ICD-10-CM

## 2015-10-12 DIAGNOSIS — F32A Depression, unspecified: Secondary | ICD-10-CM

## 2015-10-12 DIAGNOSIS — Z1231 Encounter for screening mammogram for malignant neoplasm of breast: Secondary | ICD-10-CM

## 2015-10-12 DIAGNOSIS — E785 Hyperlipidemia, unspecified: Secondary | ICD-10-CM

## 2015-10-12 DIAGNOSIS — IMO0002 Reserved for concepts with insufficient information to code with codable children: Secondary | ICD-10-CM

## 2015-10-12 MED ORDER — NIACIN ER (ANTIHYPERLIPIDEMIC) 500 MG PO TBCR: 500.0000 mg | EXTENDED_RELEASE_TABLET | Freq: Every day | ORAL | Status: DC

## 2015-10-12 NOTE — Assessment & Plan Note (Signed)
Hyperlipidemia:Low fat diet discussed and encouraged.   Lipid Panel  Lab Results  Component Value Date   CHOL 227* 07/29/2015   HDL 78 07/29/2015   LDLCALC 135* 07/29/2015   TRIG 70 07/29/2015   CHOLHDL 2.9 07/29/2015   Updated lab needed at/ before next visit.

## 2015-10-12 NOTE — Assessment & Plan Note (Signed)
Resolved without medication, more interested in interacting with family and friends

## 2015-10-12 NOTE — Patient Instructions (Addendum)
Annual wellness in 3.5 month, call if you need me sooner  CONGRATS on excellent blood sugar  The less cholesterol / fat you eat , the less medicine you need, and aching will get less  Fasting lipid, cmp and EGFr, HBA1c and CPK in 3.5 month  Lower dose of niaspan when you next fill is 500 mg one daily as we discussed,   Mammogram appt at checkout

## 2015-10-12 NOTE — Assessment & Plan Note (Signed)
Unchanged Patient re-educated about  the importance of commitment to a  minimum of 150 minutes of exercise per week.  The importance of healthy food choices with portion control discussed. Encouraged to start a food diary, count calories and to consider  joining a support group. Sample diet sheets offered. Goals set by the patient for the next several months.   Weight /BMI 10/12/2015 08/18/2015 07/30/2015  WEIGHT 185 lb 189 lb 184 lb  HEIGHT 5\' 5"  5\' 5"  5\' 5"   BMI 30.79 kg/m2 31.45 kg/m2 30.62 kg/m2    Current exercise per week 60 minutes.

## 2015-10-12 NOTE — Progress Notes (Signed)
Subjective:    Patient ID: [name] [name], female    DOB: 08/12/1946, 70 y.o.   MRN: [id number]  HPI   [name] [name]     MRN: [id number]      DOB: Jul 01, 1946   HPI [name] is here for follow up and re-evaluation of chronic medical conditions, medication management and review of any available recent lab and radiology data.  Preventive health is updated, specifically  Cancer screening and Immunization.   Questions or concerns regarding consultations or procedures which the PT has had in the interim are  addressed. The PT denies any adverse reactions to current medications since the last visit. Took one tablet for depression and no more, made her feel funny, reports improvement as far as grief and depression are concerned, neither wants nor needs medication at this timeThere are no new concerns.  Denies polyuria, polydipsia, blurred vision , or hypoglycemic episodes.   ROS Denies recent fever or chills. Denies sinus pressure, nasal congestion, ear pain or sore throat. Denies chest congestion, productive cough or wheezing. Denies chest pains, palpitations and leg swelling Denies abdominal pain, nausea, vomiting,diarrhea or constipation.   Denies dysuria, frequency, hesitancy or incontinence. Denies joint pain, swelling and limitation in mobility. Denies headaches, seizures, numbness, or tingling. Denies depression, anxiety or insomnia. Denies skin break down or rash.   PE  BP 118/74 mmHg  Pulse 86  Resp 16  Ht 5\' 5"  (1.651 m)  Wt 185 lb (83.915 kg)  BMI 30.79 kg/m2  SpO2 100%  Patient alert and oriented and in no cardiopulmonary distress.  HEENT: No facial asymmetry, EOMI,   oropharynx pink and moist.  Neck supple no JVD, no mass.  Chest: Clear to auscultation bilaterally.  CVS: S1, S2, systolic murmur, no S3.Regular rate.click present  ABD: Soft non tender.   Ext: No edema  MS: Adequate ROM spine, shoulders, hips and knees.  Skin: Intact, no ulcerations or  rash noted.  Psych: Good eye contact, normal affect. Memory intact not anxious or depressed appearing.  CNS: CN 2-12 intact, power,  normal throughout.no focal deficits noted.   Assessment & Plan   Essential hypertension, benign Controlled, no change in medication DASH diet and commitment to daily physical activity for a minimum of 30 minutes discussed and encouraged, as a part of hypertension management. The importance of attaining a healthy weight is also discussed.  BP/Weight 10/12/2015 08/18/2015 07/30/2015 06/04/2015 05/06/2015 04/29/2015 99991111  Systolic BP 123456 0000000 123456 [id number]  Diastolic BP 74 76 66 77 80 62 78  Wt. 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   The need to take medication as prescribed, test blood sugar as directed, and to call between visits if there is a concern that blood sugar is uncontrolled is also discussed.   [name] [name] is reminded of the importance of daily foot exam, annual eye examination, and good blood sugar, blood pressure and cholesterol control.  Diabetic Labs Latest Ref Rng 10/07/2015 07/29/2015 06/11/2015 03/27/2015 03/26/2015  HbA1c <5.7 % 7.2(H) - 8.0(H) - -  Microalbumin <2.0  mg/dL - - - - -  Micro/Creat Ratio 0.0 - 30.0 mg/g - - - - -  Chol 125 - 200 mg/dL - 227(H) - - -  HDL >=46 mg/dL - 78 - - -  Calc LDL <130 mg/dL - 135(H) - - -  Triglycerides <150 mg/dL - 70 - - -  Creatinine 0.60 - 0.93 mg/dL 1.82(H) 1.69(H) 1.79(H) 1.78(H) 1.74(H)   BP/Weight 10/12/2015 08/18/2015 07/30/2015 06/04/2015 05/06/2015 04/29/2015 99991111  Systolic BP 123456 0000000 123456 [id number]  [id number]  Diastolic BP 74 76 66 77 80 62 78  Wt. (Lbs) 185 189 184 184.53 184.53 183 184  BMI 30.79 31.45 30.62 29.8 29.8 30.45 32.6   Foot/eye exam completion dates Latest Ref Rng 12/30/2014 12/09/2014  Eye Exam No Retinopathy No Retinopathy -  Foot exam Order - - -  Foot Form Completion - - Done         Depression Resolved without medication, more interested in interacting with family and friends  Hyperlipidemia LDL goal <100 Hyperlipidemia:Low fat diet discussed and encouraged.   Lipid Panel  Lab Results  Component Value Date   CHOL 227* 07/29/2015   HDL 78 07/29/2015   LDLCALC 135* 07/29/2015   TRIG 70 07/29/2015   CHOLHDL 2.9 07/29/2015   Updated lab needed at/ before next visit.       Obesity Unchanged Patient re-educated about  the importance of commitment to a  minimum of 150 minutes of exercise per week.  The importance of healthy food choices with portion control discussed. Encouraged to start a food diary, count calories and to consider  joining a support group. Sample diet sheets offered. Goals set by the patient for the next several months.   Weight /BMI 10/12/2015 08/18/2015 07/30/2015  WEIGHT 185 lb 189 lb 184 lb  HEIGHT 5\' 5"  5\' 5"  5\' 5"   BMI 30.79 kg/m2 31.45 kg/m2 30.62 kg/m2    Current exercise per week 60 minutes.        Review of Systems     Objective:   Physical Exam        Assessment & Plan:

## 2015-10-12 NOTE — Assessment & Plan Note (Signed)
Marked improvement and at goal Ms. Geitz is reminded of the importance of commitment to daily physical activity for 30 minutes or more, as able and the need to limit carbohydrate intake to 30 to 60 grams per meal to help with blood sugar control.   The need to take medication as prescribed, test blood sugar as directed, and to call between visits if there is a concern that blood sugar is uncontrolled is also discussed.   Ms. Volk is reminded of the importance of daily foot exam, annual eye examination, and good blood sugar, blood pressure and cholesterol control.  Diabetic Labs Latest Ref Rng 10/07/2015 07/29/2015 06/11/2015 03/27/2015 03/26/2015  HbA1c <5.7 % 7.2(H) - 8.0(H) - -  Microalbumin <2.0 mg/dL - - - - -  Micro/Creat Ratio 0.0 - 30.0 mg/g - - - - -  Chol 125 - 200 mg/dL - 227(H) - - -  HDL >=46 mg/dL - 78 - - -  Calc LDL <130 mg/dL - 135(H) - - -  Triglycerides <150 mg/dL - 70 - - -  Creatinine 0.60 - 0.93 mg/dL 1.82(H) 1.69(H) 1.79(H) 1.78(H) 1.74(H)   BP/Weight 10/12/2015 08/18/2015 07/30/2015 06/04/2015 05/06/2015 04/29/2015 [id number]  Systolic BP 123456 0000000 123456 [id number]  Diastolic BP 74 76 66 77 80 62 78  Wt. (Lbs) 185 189 184 184.53 184.53 183 184  BMI 30.79 31.45 30.62 29.8 29.8 30.45 32.6   Foot/eye exam completion dates Latest Ref Rng 12/30/2014 12/09/2014  Eye Exam No Retinopathy No Retinopathy -  Foot exam Order - - -  Foot Form Completion - - Done

## 2015-10-12 NOTE — Assessment & Plan Note (Signed)
Controlled, no change in medication DASH diet and commitment to daily physical activity for a minimum of 30 minutes discussed and encouraged, as a part of hypertension management. The importance of attaining a healthy weight is also discussed.  BP/Weight 10/12/2015 08/18/2015 07/30/2015 06/04/2015 05/06/2015 04/29/2015 99991111  Systolic BP 123456 0000000 123456 [id number] 0000000 [id number]  Diastolic BP 74 76 66 77 80 62 78  Wt. (Lbs) 185 189 184 184.53 184.53 183 184  BMI 30.79 31.45 30.62 29.8 29.8 30.45 32.6

## 2015-10-13 ENCOUNTER — Other Ambulatory Visit: Payer: Self-pay

## 2015-10-13 DIAGNOSIS — E1322 Other specified diabetes mellitus with diabetic chronic kidney disease: Secondary | ICD-10-CM

## 2015-10-13 DIAGNOSIS — N184 Chronic kidney disease, stage 4 (severe): Principal | ICD-10-CM

## 2015-10-13 DIAGNOSIS — E1365 Other specified diabetes mellitus with hyperglycemia: Principal | ICD-10-CM

## 2015-10-13 DIAGNOSIS — IMO0002 Reserved for concepts with insufficient information to code with codable children: Secondary | ICD-10-CM

## 2015-10-13 MED ORDER — GLIPIZIDE ER 5 MG PO TB24: ORAL_TABLET | ORAL | Status: DC

## 2015-10-13 MED ORDER — FENOFIBRATE 145 MG PO TABS: 145.0000 mg | ORAL_TABLET | Freq: Every day | ORAL | Status: DC

## 2015-10-20 ENCOUNTER — Telehealth (HOSPITAL_COMMUNITY): Payer: Self-pay | Admitting: *Deleted

## 2015-10-20 NOTE — Telephone Encounter (Signed)
Office received ref to sch therapy appt for pt. Spoke with pt to sch appt and per pt she did not want to sch appt.

## 2015-10-21 ENCOUNTER — Ambulatory Visit (HOSPITAL_COMMUNITY)
Admission: RE | Admit: 2015-10-21 | Discharge: 2015-10-21 | Disposition: A | Discharge status: Home / Self Care | Payer: PPO | Source: Ambulatory Visit | Attending: Family Medicine | Admitting: Family Medicine

## 2015-10-21 DIAGNOSIS — Z1231 Encounter for screening mammogram for malignant neoplasm of breast: Secondary | ICD-10-CM

## 2015-10-26 ENCOUNTER — Ambulatory Visit (HOSPITAL_COMMUNITY)
Admission: RE | Admit: 2015-10-26 | Discharge: 2015-10-26 | Disposition: A | Discharge status: Home / Self Care | Payer: PPO | Source: Ambulatory Visit | Attending: Cardiology | Admitting: Cardiology

## 2015-10-26 DIAGNOSIS — I351 Nonrheumatic aortic (valve) insufficiency: Secondary | ICD-10-CM | POA: Insufficient documentation

## 2015-10-26 DIAGNOSIS — E785 Hyperlipidemia, unspecified: Secondary | ICD-10-CM | POA: Diagnosis not present

## 2015-10-26 DIAGNOSIS — I119 Hypertensive heart disease without heart failure: Secondary | ICD-10-CM | POA: Insufficient documentation

## 2015-10-26 DIAGNOSIS — Z953 Presence of xenogenic heart valve: Secondary | ICD-10-CM | POA: Insufficient documentation

## 2015-10-26 DIAGNOSIS — I340 Nonrheumatic mitral (valve) insufficiency: Secondary | ICD-10-CM | POA: Insufficient documentation

## 2015-10-26 DIAGNOSIS — R29898 Other symptoms and signs involving the musculoskeletal system: Secondary | ICD-10-CM | POA: Diagnosis not present

## 2015-10-26 DIAGNOSIS — I429 Cardiomyopathy, unspecified: Secondary | ICD-10-CM

## 2015-10-26 LAB — ECHOCARDIOGRAM COMPLETE
AO mean calculated velocity dopler: 174 cm/s
AORTIC ROOT 2D: 24 mm
AV Mean grad: 14 mmHg
AV Peak grad: 25 mmHg
AV peak Index: 0.51
AV vel: 1.06
Ao pk vel: 0.35 m/s
E decel time: 322 msec
E/e' ratio: 19.28
FS: 19 % — AB (ref 28–44)
IVS/LV PW RATIO, ED: 1.34
LA ID, A-P, ES: 44 mm
LA SIZE INDEX: 2.3 mm/m2
LA VOL 2D INDEX: 43.6 mL/m2
LA VOL 2D: 83.2 mL
LA diam end sys: 44 mm
LA vol index: 36 mL/m2
LA vol: 68.7 mL
LV PW d: 9.03 mm — AB (ref 0.6–1.1)
LV PW s: 9.03 mm
LV dias vol index: 105 mL/m2
LV dias vol: 201 mL — AB (ref 46–106)
LV sys vol index: 77 mL/m2
LV sys vol: 201 mL — AB (ref 14–42)
LVIDD: 60.6 mm — AB (ref 3.5–6.0)
LVIDS: 49.3 mm — AB (ref 2.1–4.0)
LVOT MEAN VEL: 64.3 cm/s
LVOT SV INDEX: 31 mL/m2
LVOT SV: 60 mL
LVOT VTI: 21.2 cm
LVOT area: 2.84 cm2
LVOT diameter: 19 mm
LVOT peak VTI: 0.37 cm
LVOT peak grad rest: 3 mmHg
LVOT peak vel: 86.7 cm/s
MV Dec: 322 ms
MV Peak grad: 4 mmHg
MV pk A vel: 151 cm/s
MV pk E vel: 98.5 cm/s
SV INDEX: 27.7 mL/m2
Simpson's disk: 26
Single Plane A4C EF: 21 %
Stroke v: 53 ml
TAPSE: 25.8 mm
TDI e' lateral: 5.11 cm/s
TDI e' medial: 3.7 cm/s
TR max vel: 332 cm/s
TV PEAK RV-RA GRADIENT: 44 cm/s
VTI: 56.9 cm
Valve area index: 0.55
Valve area: 1.06 cm2

## 2015-10-27 ENCOUNTER — Telehealth: Payer: Self-pay

## 2015-10-27 ENCOUNTER — Telehealth: Payer: Self-pay | Admitting: Family Medicine

## 2015-10-27 ENCOUNTER — Other Ambulatory Visit: Payer: Self-pay

## 2015-10-27 DIAGNOSIS — I513 Intracardiac thrombosis, not elsewhere classified: Secondary | ICD-10-CM

## 2015-10-27 NOTE — Telephone Encounter (Signed)
Order placed,pt made aware,echo apt made

## 2015-10-27 NOTE — Telephone Encounter (Signed)
Patient has questions about her insulin, she is about to need a refill but she needs something her insurance is going to pay for, please advise?

## 2015-10-27 NOTE — Telephone Encounter (Signed)
Insulin copay is $8 and she will collect today

## 2015-10-27 NOTE — Telephone Encounter (Signed)
-----   Message from [name], MD sent at 10/27/2015  9:34 AM EDT ----- Reviewed report and images. Report indicates LV apical mural thrombus, however this could be an artifact based on my review the images. I would suggest that she get a Definity contrast echocardiogram to better clarify before committing her to Coumadin.

## 2015-10-29 ENCOUNTER — Ambulatory Visit (HOSPITAL_COMMUNITY)
Admission: RE | Admit: 2015-10-29 | Discharge: 2015-10-29 | Disposition: A | Discharge status: Home / Self Care | Payer: PPO | Source: Ambulatory Visit | Attending: Internal Medicine | Admitting: Internal Medicine

## 2015-10-29 DIAGNOSIS — I513 Intracardiac thrombosis, not elsewhere classified: Secondary | ICD-10-CM

## 2015-10-29 DIAGNOSIS — I213 ST elevation (STEMI) myocardial infarction of unspecified site: Secondary | ICD-10-CM | POA: Insufficient documentation

## 2015-10-29 DIAGNOSIS — I230 Hemopericardium as current complication following acute myocardial infarction: Secondary | ICD-10-CM | POA: Diagnosis not present

## 2015-10-29 MED ORDER — PERFLUTREN LIPID MICROSPHERE
1.0000 mL | INTRAVENOUS | Status: AC | PRN
  Administered 2015-10-29: 1 mL via INTRAVENOUS
  Administered 2015-10-29: 2 mL via INTRAVENOUS

## 2015-11-09 ENCOUNTER — Ambulatory Visit: Payer: Self-pay | Admitting: Cardiology

## 2015-11-13 ENCOUNTER — Encounter: Payer: Self-pay | Admitting: Cardiology

## 2015-11-13 ENCOUNTER — Ambulatory Visit (INDEPENDENT_AMBULATORY_CARE_PROVIDER_SITE_OTHER): Payer: PPO | Admitting: Cardiology

## 2015-11-13 VITALS — BP 108/64 | HR 78 | Ht 65.0 in | Wt 187.0 lb

## 2015-11-13 DIAGNOSIS — I429 Cardiomyopathy, unspecified: Secondary | ICD-10-CM | POA: Diagnosis not present

## 2015-11-13 DIAGNOSIS — N183 Chronic kidney disease, stage 3 unspecified: Secondary | ICD-10-CM

## 2015-11-13 DIAGNOSIS — Z954 Presence of other heart-valve replacement: Secondary | ICD-10-CM

## 2015-11-13 DIAGNOSIS — E785 Hyperlipidemia, unspecified: Secondary | ICD-10-CM

## 2015-11-13 DIAGNOSIS — Z789 Other specified health status: Secondary | ICD-10-CM

## 2015-11-13 DIAGNOSIS — Z952 Presence of prosthetic heart valve: Secondary | ICD-10-CM

## 2015-11-13 DIAGNOSIS — I447 Left bundle-branch block, unspecified: Secondary | ICD-10-CM

## 2015-11-13 DIAGNOSIS — I428 Other cardiomyopathies: Secondary | ICD-10-CM

## 2015-11-13 NOTE — Patient Instructions (Signed)
Your physician recommends that you schedule a follow-up appointment in: 4 months Dr [name]   Your physician recommends that you continue on your current medications as directed. Please refer to the Current Medication list given to you today.   If you need a refill on your cardiac medications before your next appointment, please call your pharmacy.    Thank you for choosing [facility] !

## 2015-11-13 NOTE — Progress Notes (Signed)
Cardiology Office Note  Date: 11/13/2015   ID: Cardin, [place] Mar 31, 1946, MRN [id number]  PCP: [name], MD  Primary Cardiologist: Rozann Lesches, MD   Chief Complaint  Patient presents with  . Status post TAVR  . Cardiomyopathy    History of Present Illness: [name] [name] is a 70 y.o. female last seen in December 2016.She presents for a routine follow-up visit. States that she tries to walk as much as she can and take care of her ADLs. She reports NYHA class II dyspnea, no angina symptoms. Also no palpitations or syncope. She has been troubled by arthritic pains in her knees and shoulders.  We discussed her follow-up echocardiograms as outlined below. Her LVEF is still reduced, in the range of 20-25%, but she did not have evidence of mural thrombus by Definity contrast. Our plan will be to continue aspirin and Plavix status post TAVR. We have discussed potential for defibrillator placement, she might even be a candidate for biventricular ICD with left bundle branch block, but she does not want to pursue this at this time. Fortunately, she is symptomatically stable.  I reviewed her follow-up lab work, creatinine is 1.8, potassium 4.2. Medications also reviewed. Current cardiac regimen includes aspirin, Plavix, Coreg, Zetia, TriCor, Lasix, and Aldactone. She is not on ARB with prior history of progressive renal insufficiency.  Past Medical History  Diagnosis Date  . Essential hypertension, benign   . Hyperlipidemia   . Anxiety   . Aortic stenosis   . Symptomatic PVCs   . Nonischemic cardiomyopathy (HCC)     No significant obstructive CAD at heart catheterization 05/2014, LVEF 20%  . CKD (chronic kidney disease) stage 3, GFR 30-59 ml/min   . Chronic systolic heart failure ([place])   . Obesity   . Depression   . Constipation   . Incidental pulmonary nodule, > 36mm and < 72mm     7 x 4 mm LLL nodule  . Arthritis   . LBBB (left bundle branch block)   . Pneumonia 2013   . Diabetes mellitus, type 2 ([place])   . 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 Cesarean section  1987  . Multiple extractions with alveoloplasty N/A 02/27/2014    Procedure: Extraction of tooth #'s 2,4,5,6,7,8,9,10,11,12, 22, 23, 24, 25, 26, 28 with alveoloplasty and bilateral mandibular tori reductions;  Surgeon: [name], DDS;  Location: [place];  Service: Oral Surgery;  Laterality: N/A;  . Tubal ligation  1987  . Video bronchoscopy Bilateral 06/05/2014    Procedure: VIDEO BRONCHOSCOPY WITHOUT FLUORO;  Surgeon: [name], MD;  Location: [place];  Service: Cardiopulmonary;  Laterality: Bilateral;  . Left and right heart catheterization with coronary angiogram N/A 12/05/2011    Procedure: LEFT AND RIGHT HEART CATHETERIZATION WITH CORONARY ANGIOGRAM;  Surgeon: [name], MD;  Location[facility] C[facility] LAB;  Service: Cardiovascular;  Laterality: N/A;  . Left and right heart catheterization with coronary angiogram N/A 05/23/2014    Procedure: LEFT AND RIGHT HEART CATHETERIZATION WITH CORONARY ANGIOGRAM;  Surgeon: [name], MD;  Location: [facility] CATH LAB;  Service: Cardiovascular;  Laterality: N/A;  . Cardiac catheterization    . Flexible bronchoscopy N/A 01/14/2015    Procedure: FLEXIBLE BRONCHOSCOPY;  Surgeon: [name], MD;  Location: [place];  Service: Thoracic;  Laterality:  N/A;  . Rigid bronchoscopy N/A 01/14/2015    Procedure: RIGID BRONCHOSCOPY with Removal Of Foreign Body ;  Surgeon: [name], MD;  Location: [place];  Service: Thoracic;  Laterality: N/A;  . Transcatheter aortic valve replacement, transfemoral Bilateral 03/24/2015    Procedure: TRANSCATHETER AORTIC VALVE REPLACEMENT, TRANSFEMORAL;  Surgeon: [name], MD;  Location: [place];  Service: Open Heart  Surgery;  Laterality: Bilateral;  . Tee without cardioversion N/A 03/24/2015    Procedure: TRANSESOPHAGEAL ECHOCARDIOGRAM (TEE);  Surgeon: [name], MD;  Location: [place];  Service: Open Heart Surgery;  Laterality: N/A;    Current Outpatient Prescriptions  Medication Sig Dispense Refill  . acetaminophen (TYLENOL) 500 MG tablet Take 1,000 mg by mouth every 6 (six) hours as needed for moderate pain.    Marland Kitchen aspirin EC 81 MG tablet Take 81 mg by mouth daily.    Marland Kitchen azelastine (ASTELIN) 0.1 % nasal spray Place 1 spray into both nostrils 2 (two) times daily. Use in each nostril as directed 30 mL 6  . carvedilol (COREG) 25 MG tablet TAKE 1 TABLET BY MOUTH TWICE DAILY WITH MEALS. 60 tablet 6  . clopidogrel (PLAVIX) 75 MG tablet Take 1 tablet (75 mg total) by mouth daily with breakfast. 30 tablet 6  . EASYMAX TEST test strip Check blood sugar three times daily dx E10.9 100 each 5  . ezetimibe (ZETIA) 10 MG tablet Take 1 tablet (10 mg total) by mouth daily. 30 tablet 3  . fenofibrate (TRICOR) 145 MG tablet Take 1 tablet (145 mg total) by mouth daily. 30 tablet 3  . furosemide (LASIX) 40 MG tablet Take 1 tablet (40 mg total) by mouth every morning. 30 tablet 3  . glipiZIDE (GLUCOTROL XL) 5 MG 24 hr tablet TAKE 1 TABLET BY MOUTH DAILY WITH BREAKFAST. 30 tablet 3  . HUMALOG MIX 50/50 KWIKPEN (50-50) 100 UNIT/ML Kwikpen Inject 12 Units into the skin 2 (two) times daily. 15 mL 5  . Multiple Vitamins-Minerals (MULTIVITAMIN PO) Take 1 tablet by mouth daily.    . niacin (NIASPAN) 500 MG CR tablet Take 1 tablet (500 mg total) by mouth at bedtime. 30 tablet 5  . potassium chloride (K-DUR) 10 MEQ tablet TAKE ONE TABLET BY MOUTH DAILY. DO NOT TAKE IF NOT TAKING FUROSEMIDE. 30 tablet 2  . spironolactone (ALDACTONE) 25 MG tablet Take 1 tablet (25 mg total) by mouth daily. 90 tablet 3  . UNIFINE PENTIPS 31G X 8 MM MISC USE AS NEEDED TO INJECT INSULIN. 100 each 5  . [DISCONTINUED] cetirizine (ZYRTEC) 10 MG  tablet Take 1 tablet (10 mg total) by mouth daily. 30 tablet 3  . [DISCONTINUED] ferrous sulfate 325 (65 FE) MG EC tablet Take 1 tablet (325 mg total) by mouth 2 (two) times daily. 60 tablet 5  . [DISCONTINUED] FLUoxetine (PROZAC) 10 MG tablet Take 1 tablet (10 mg total) by mouth daily. Take one capsule by mouth once a day 30 tablet 3  . [DISCONTINUED] fluticasone (FLONASE) 50 MCG/ACT nasal spray Place 2 sprays into the nose daily. 16 g 3   No current facility-administered medications for this visit.   Allergies:  Crestor and Penicillins   Social History: The patient  reports that she has never smoked. She has never used smokeless tobacco. She reports that she does not drink alcohol or use illicit drugs.   ROS:  Please see the history of present illness. Otherwise, complete review of systems is positive for arthritic symptoms.  All other  systems are reviewed and negative.   Physical Exam: VS:  BP 108/64 mmHg  Pulse 78  Ht 5\' 5"  (1.651 m)  Wt 187 lb (84.823 kg)  BMI 31.12 kg/m2  SpO2 94%, BMI Body mass index is 31.12 kg/(m^2).  Wt Readings from Last 3 Encounters:  11/13/15 187 lb (84.823 kg)  10/12/15 185 lb (83.915 kg)  08/18/15 189 lb (85.73 kg)    Overweight woman, in no distress  HEENT: Conjunctiva and lids normal, oropharynx clear.  Neck: Supple, no carotid bruits, no thyromegaly.  Lungs: Clear to auscultation, nonlabored breathing at rest.  Cardiac: Regular rate and rhythm, indistinct PMI, no S3, 3/6 systolic murmur, no pericardial rub.  Abdomen: Soft, nontender, bowel sounds present, no guarding or rebound.  Extremities: Trace edema, distal pulses 2+.  Skin: Warm and dry. Musculoskeletal: No kyphosis. Neuropsychiatric: Alert and oriented 3, affect appropriate.  ECG: I personally reviewed the prior tracing from 03/25/2015 which showed sinus rhythm with left bundle branch block.  Recent Labwork: 03/26/2015: Hemoglobin 11.0*; Platelets 89* 06/11/2015: Magnesium  2.1 07/29/2015: ALT 23; AST 25 10/07/2015: BUN 28*; Creat 1.82*; Potassium 4.2; Sodium 139; TSH 3.88     Component Value Date/Time   CHOL 227* 07/29/2015 0841   TRIG 70 07/29/2015 0841   HDL 78 07/29/2015 0841   CHOLHDL 2.9 07/29/2015 0841   VLDL 14 07/29/2015 0841   LDLCALC 135* 07/29/2015 0841    Other Studies Reviewed Today:  Definity contrast echocardiogram 10/29/2015: Impressions:  - Limited study with Definity contrast to assess for LV mural  thrombus. LVEF is approximately 20-25%. No definite LV mural  thrombus visualized in multiple views with Definity contrast.  There is evidence of stasis and low flow with swirling contrast  in the LV cavity.  Echocardiogram 10/26/2015: Study Conclusions  - Left ventricle: LVEF is approximately 20 to 25% with akinesis of  the inferior, septal, distal posterior and apical walls.  Echodensity at apex consistent with mural thrombus. The cavity  size was severely dilated. Wall thickness was increased in a  pattern of mild LVH. Systolic function was severely reduced. The  estimated ejection fraction was in the range of 20% to 25%.  Doppler parameters are consistent with abnormal left ventricular  relaxation (grade 1 diastolic dysfunction). - Aortic valve: Difficult to see motion of aortic valve prosthesis  Peak and mean gradients through the valve are 25 and 14 mm Hg  respectively. There was trivial regurgitation. - Mitral valve: There was mild regurgitation. - Left atrium: The atrium was moderately dilated. - Pulmonary arteries: PA peak pressure: 47 mm Hg (S).  Assessment and Plan:  1. History of severe aortic stenosis status post TAVR in August 2016. Follow-up echocardiogram shows stable prosthetic function. She continues on aspirin and Plavix.  2. Nonischemic cardiomyopathy with no significant CAD by cardiac catheterization, LVEF 20-25%. No evidence of LV mural thrombus by Definity contrast echocardiography. We will  continue medical therapy as outlined above. She has a left bundle branch block by ECG, would be a potential candidate for biventricular ICD, although she does not want to pursue this. We will continue to discuss over time.  3. CKD stage III, creatinine 1.8.  4. Hyperlipidemia with statin intolerance. She is on Scientist, clinical (histocompatibility and immunogenetics).  5. Essential hypertension, blood pressure well controlled on current regimen.  Current medicines were reviewed with the patient today.  Disposition: FU with me in 4 months.   Signed, [name], MD, [facility] 11/13/2015 8:23 AM    [facility] [place]. [address] [place], [place] [address] Phone: [phone]; Fax: [phone]

## 2015-11-18 ENCOUNTER — Other Ambulatory Visit: Payer: Self-pay | Admitting: Family Medicine

## 2015-12-03 ENCOUNTER — Ambulatory Visit: Payer: Self-pay | Admitting: Orthopedic Surgery

## 2015-12-03 ENCOUNTER — Other Ambulatory Visit: Payer: Self-pay | Admitting: Family Medicine

## 2015-12-03 ENCOUNTER — Encounter: Payer: Self-pay | Admitting: Orthopedic Surgery

## 2015-12-03 ENCOUNTER — Encounter: Payer: Self-pay | Admitting: Orthopaedic Surgery

## 2015-12-08 ENCOUNTER — Other Ambulatory Visit: Payer: Self-pay | Admitting: *Deleted

## 2015-12-08 DIAGNOSIS — I350 Nonrheumatic aortic (valve) stenosis: Secondary | ICD-10-CM

## 2015-12-21 ENCOUNTER — Other Ambulatory Visit: Payer: Self-pay | Admitting: Family Medicine

## 2015-12-22 ENCOUNTER — Other Ambulatory Visit: Payer: Self-pay

## 2015-12-22 MED ORDER — INSULIN ASPART PROT & ASPART (70-30 MIX) 100 UNIT/ML PEN: 12.0000 [IU] | PEN_INJECTOR | Freq: Two times a day (BID) | SUBCUTANEOUS | Status: DC

## 2016-01-01 ENCOUNTER — Observation Stay (HOSPITAL_COMMUNITY)
Admission: EM | Admit: 2016-01-01 | Discharge: 2016-01-01 | Disposition: A | Discharge status: Home / Self Care | Payer: PPO | Attending: Internal Medicine | Admitting: Internal Medicine

## 2016-01-01 ENCOUNTER — Observation Stay (HOSPITAL_BASED_OUTPATIENT_CLINIC_OR_DEPARTMENT_OTHER): Payer: PPO

## 2016-01-01 ENCOUNTER — Emergency Department (HOSPITAL_COMMUNITY): Payer: PPO

## 2016-01-01 ENCOUNTER — Encounter (HOSPITAL_COMMUNITY): Payer: Self-pay

## 2016-01-01 DIAGNOSIS — I130 Hypertensive heart and chronic kidney disease with heart failure and stage 1 through stage 4 chronic kidney disease, or unspecified chronic kidney disease: Secondary | ICD-10-CM | POA: Insufficient documentation

## 2016-01-01 DIAGNOSIS — N183 Chronic kidney disease, stage 3 (moderate): Secondary | ICD-10-CM | POA: Diagnosis not present

## 2016-01-01 DIAGNOSIS — M1990 Unspecified osteoarthritis, unspecified site: Secondary | ICD-10-CM | POA: Insufficient documentation

## 2016-01-01 DIAGNOSIS — I447 Left bundle-branch block, unspecified: Secondary | ICD-10-CM

## 2016-01-01 DIAGNOSIS — R079 Chest pain, unspecified: Principal | ICD-10-CM

## 2016-01-01 DIAGNOSIS — N184 Chronic kidney disease, stage 4 (severe): Secondary | ICD-10-CM

## 2016-01-01 DIAGNOSIS — E1122 Type 2 diabetes mellitus with diabetic chronic kidney disease: Secondary | ICD-10-CM | POA: Diagnosis not present

## 2016-01-01 DIAGNOSIS — F329 Major depressive disorder, single episode, unspecified: Secondary | ICD-10-CM | POA: Diagnosis not present

## 2016-01-01 DIAGNOSIS — I5022 Chronic systolic (congestive) heart failure: Secondary | ICD-10-CM | POA: Diagnosis not present

## 2016-01-01 DIAGNOSIS — R0789 Other chest pain: Secondary | ICD-10-CM | POA: Diagnosis not present

## 2016-01-01 DIAGNOSIS — I429 Cardiomyopathy, unspecified: Secondary | ICD-10-CM

## 2016-01-01 DIAGNOSIS — E785 Hyperlipidemia, unspecified: Secondary | ICD-10-CM | POA: Diagnosis not present

## 2016-01-01 DIAGNOSIS — Z7984 Long term (current) use of oral hypoglycemic drugs: Secondary | ICD-10-CM | POA: Insufficient documentation

## 2016-01-01 DIAGNOSIS — Z794 Long term (current) use of insulin: Secondary | ICD-10-CM | POA: Diagnosis not present

## 2016-01-01 DIAGNOSIS — Z79899 Other long term (current) drug therapy: Secondary | ICD-10-CM | POA: Insufficient documentation

## 2016-01-01 DIAGNOSIS — I129 Hypertensive chronic kidney disease with stage 1 through stage 4 chronic kidney disease, or unspecified chronic kidney disease: Secondary | ICD-10-CM | POA: Diagnosis present

## 2016-01-01 DIAGNOSIS — I5042 Chronic combined systolic (congestive) and diastolic (congestive) heart failure: Secondary | ICD-10-CM | POA: Diagnosis present

## 2016-01-01 DIAGNOSIS — Z7982 Long term (current) use of aspirin: Secondary | ICD-10-CM | POA: Diagnosis not present

## 2016-01-01 DIAGNOSIS — J9811 Atelectasis: Secondary | ICD-10-CM | POA: Diagnosis not present

## 2016-01-01 DIAGNOSIS — I10 Essential (primary) hypertension: Secondary | ICD-10-CM

## 2016-01-01 DIAGNOSIS — Z952 Presence of prosthetic heart valve: Secondary | ICD-10-CM

## 2016-01-01 DIAGNOSIS — I428 Other cardiomyopathies: Secondary | ICD-10-CM

## 2016-01-01 LAB — CBC WITH DIFFERENTIAL/PLATELET
Basophils Absolute: 0.1 10*3/uL (ref 0.0–0.1)
Basophils Relative: 3 %
Eosinophils Absolute: 0.2 10*3/uL (ref 0.0–0.7)
Eosinophils Relative: 4 %
HCT: 34.2 % — ABNORMAL LOW (ref 36.0–46.0)
Hemoglobin: 10.9 g/dL — ABNORMAL LOW (ref 12.0–15.0)
Lymphocytes Relative: 41 %
Lymphs Abs: 2.3 10*3/uL (ref 0.7–4.0)
MCH: 25.1 pg — ABNORMAL LOW (ref 26.0–34.0)
MCHC: 31.9 g/dL (ref 30.0–36.0)
MCV: 78.8 fL (ref 78.0–100.0)
Monocytes Absolute: 0.5 10*3/uL (ref 0.1–1.0)
Monocytes Relative: 10 %
Neutro Abs: 2.3 10*3/uL (ref 1.7–7.7)
Neutrophils Relative %: 42 %
Platelets: 178 10*3/uL (ref 150–400)
RBC: 4.34 MIL/uL (ref 3.87–5.11)
RDW: 19.1 % — ABNORMAL HIGH (ref 11.5–15.5)
WBC: 5.4 10*3/uL (ref 4.0–10.5)

## 2016-01-01 LAB — TROPONIN I
Troponin I: 0.03 ng/mL (ref <0.031)
Troponin I: 0.03 ng/mL (ref <0.031)
Troponin I: 0.03 ng/mL (ref <0.031)
Troponin I: 0.03 ng/mL (ref <0.031)

## 2016-01-01 LAB — ECHOCARDIOGRAM COMPLETE
Height: 66 in
Weight: 3012.8 oz

## 2016-01-01 LAB — COMPREHENSIVE METABOLIC PANEL
ALT: 19 U/L (ref 14–54)
AST: 23 U/L (ref 15–41)
Albumin: 4.3 g/dL (ref 3.5–5.0)
Alkaline Phosphatase: 45 U/L (ref 38–126)
Anion gap: 7 (ref 5–15)
BUN: 30 mg/dL — ABNORMAL HIGH (ref 6–20)
CO2: 26 mmol/L (ref 22–32)
Calcium: 9.4 mg/dL (ref 8.9–10.3)
Chloride: 103 mmol/L (ref 101–111)
Creatinine, Ser: 1.86 mg/dL — ABNORMAL HIGH (ref 0.44–1.00)
GFR calc Af Amer: 31 mL/min — ABNORMAL LOW (ref >60)
GFR calc non Af Amer: 26 mL/min — ABNORMAL LOW (ref >60)
Glucose, Bld: 143 mg/dL — ABNORMAL HIGH (ref 65–99)
Potassium: 3.3 mmol/L — ABNORMAL LOW (ref 3.5–5.1)
Sodium: 136 mmol/L (ref 135–145)
Total Bilirubin: 0.4 mg/dL (ref 0.3–1.2)
Total Protein: 7.7 g/dL (ref 6.5–8.1)

## 2016-01-01 LAB — BRAIN NATRIURETIC PEPTIDE: B Natriuretic Peptide: 186 pg/mL — ABNORMAL HIGH (ref 0.0–100.0)

## 2016-01-01 LAB — CBG MONITORING, ED: Glucose-Capillary: 139 mg/dL — ABNORMAL HIGH (ref 65–99)

## 2016-01-01 MED ORDER — GI COCKTAIL ~~LOC~~: 30.0000 mL | Freq: Four times a day (QID) | ORAL | Status: DC | PRN

## 2016-01-01 MED ORDER — SPIRONOLACTONE 25 MG PO TABS
25.0000 mg | ORAL_TABLET | Freq: Every day | ORAL | Status: DC
  Administered 2016-01-01: 25 mg via ORAL
  Filled 2016-01-01: qty 1

## 2016-01-01 MED ORDER — INSULIN ASPART PROT & ASPART (70-30 MIX) 100 UNIT/ML ~~LOC~~ SUSP
12.0000 [IU] | Freq: Two times a day (BID) | SUBCUTANEOUS | Status: DC
  Administered 2016-01-01: 12 [IU] via SUBCUTANEOUS
  Filled 2016-01-01: qty 10

## 2016-01-01 MED ORDER — FUROSEMIDE 40 MG PO TABS
40.0000 mg | ORAL_TABLET | Freq: Every morning | ORAL | Status: DC
  Administered 2016-01-01: 40 mg via ORAL
  Filled 2016-01-01: qty 1

## 2016-01-01 MED ORDER — ACETAMINOPHEN 500 MG PO TABS: 1000.0000 mg | ORAL_TABLET | Freq: Four times a day (QID) | ORAL | Status: DC | PRN

## 2016-01-01 MED ORDER — CLOPIDOGREL BISULFATE 75 MG PO TABS
75.0000 mg | ORAL_TABLET | Freq: Every day | ORAL | Status: DC
  Administered 2016-01-01: 75 mg via ORAL
  Filled 2016-01-01: qty 1

## 2016-01-01 MED ORDER — ACETAMINOPHEN 325 MG PO TABS: 650.0000 mg | ORAL_TABLET | ORAL | Status: DC | PRN

## 2016-01-01 MED ORDER — ASPIRIN EC 325 MG PO TBEC: 325.0000 mg | DELAYED_RELEASE_TABLET | Freq: Every day | ORAL | Status: DC

## 2016-01-01 MED ORDER — ONDANSETRON HCL 4 MG/2ML IJ SOLN: 4.0000 mg | Freq: Four times a day (QID) | INTRAMUSCULAR | Status: DC | PRN

## 2016-01-01 MED ORDER — EZETIMIBE 10 MG PO TABS
10.0000 mg | ORAL_TABLET | Freq: Every day | ORAL | Status: DC
  Administered 2016-01-01: 10 mg via ORAL
  Filled 2016-01-01: qty 1

## 2016-01-01 MED ORDER — NIACIN ER (ANTIHYPERLIPIDEMIC) 500 MG PO TBCR
500.0000 mg | EXTENDED_RELEASE_TABLET | Freq: Every day | ORAL | Status: DC
  Filled 2016-01-01 (×3): qty 1

## 2016-01-01 MED ORDER — MORPHINE SULFATE (PF) 2 MG/ML IV SOLN
2.0000 mg | INTRAVENOUS | Status: DC | PRN
Start: 2016-01-01 — End: 2016-01-01

## 2016-01-01 MED ORDER — ASPIRIN EC 81 MG PO TBEC
81.0000 mg | DELAYED_RELEASE_TABLET | Freq: Every day | ORAL | Status: DC
  Administered 2016-01-01: 81 mg via ORAL
  Filled 2016-01-01: qty 1

## 2016-01-01 MED ORDER — INSULIN ASPART PROT & ASPART (70-30 MIX) 100 UNIT/ML PEN
12.0000 [IU] | PEN_INJECTOR | Freq: Two times a day (BID) | SUBCUTANEOUS | Status: DC
  Filled 2016-01-01: qty 3

## 2016-01-01 MED ORDER — ASPIRIN 81 MG PO CHEW
243.0000 mg | CHEWABLE_TABLET | Freq: Once | ORAL | Status: AC
  Administered 2016-01-01: 243 mg via ORAL
  Filled 2016-01-01: qty 3

## 2016-01-01 NOTE — Progress Notes (Signed)
*  PRELIMINARY RESULTS* Echocardiogram 2D Echocardiogram has been performed.  [name] [name] 01/01/2016, 10:26 AM

## 2016-01-01 NOTE — ED Provider Notes (Signed)
TIME SEEN: 2:10 AM  CHIEF COMPLAINT: Chest pain  HPI: Pt is a 70 y.o. female with history of hypertension, hyperlipidemia, diabetes, nonischemic cardiomyopathy, aortic valve replacement secondary to aortic stenosis who is currently on Plavix who presents to the emergency department with chest pain that started around 8:52 PM while at rest. Describes this to me as a central to left-sided chest pain without radiation. Describes as a heaviness, tightness. Denies to me any burning pain or epigastric abdominal pain despite nursing notes. Does state that she felt short of breath. No nausea, vomiting, diaphoresis or dizziness. No lower extremity swelling or pain. States she took an aspirin at home and reports her pain is now gone. No other known aggravating or relieving factors. States she did have a stress test over a year ago that she thinks was normal. Her cardiologist is Dr. [name]. PCP is Dr. Moshe Cipro.  ROS: See HPI Constitutional: no fever  Eyes: no drainage  ENT: no runny nose   Cardiovascular:   chest pain  Resp:  SOB  GI: no vomiting GU: no dysuria Integumentary: no rash  Allergy: no hives  Musculoskeletal: no leg swelling  Neurological: no slurred speech ROS otherwise negative  PAST MEDICAL HISTORY/PAST SURGICAL HISTORY:  Past Medical History  Diagnosis Date  . Essential hypertension, benign   . Hyperlipidemia   . Anxiety   . Aortic stenosis   . Symptomatic PVCs   . Nonischemic cardiomyopathy (HCC)     No significant obstructive CAD at heart catheterization 05/2014, LVEF 20%  . CKD (chronic kidney disease) stage 3, GFR 30-59 ml/min   . Chronic systolic heart failure ([place])   . Obesity   . Depression   . Constipation   . Incidental pulmonary nodule, > 55mm and < 57mm     7 x 4 mm LLL nodule  . Arthritis   . LBBB (left bundle branch block)   . Pneumonia 2013  . Diabetes mellitus, type 2 ([place])   . 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 Authorizing Provider  acetaminophen (TYLENOL) 500 MG tablet Take 1,000 mg by mouth every 6 (six) hours as needed for moderate pain.   Yes Historical Provider, MD  aspirin EC 81 MG tablet Take 81 mg by mouth daily.   Yes Historical Provider, MD  carvedilol (COREG) 25 MG tablet TAKE 1 TABLET BY MOUTH TWICE DAILY WITH MEALS. 09/03/15  Yes [name], MD  clopidogrel (PLAVIX) 75 MG tablet Take 1 tablet (75 mg total) by mouth daily with breakfast. 07/30/15  Yes [name], MD  [facility] TEST test strip Check blood sugar three times daily dx E10.9 04/24/15  Yes Fayrene Helper, MD  ezetimibe (ZETIA) 10 MG tablet TAKE ONE TABLET BY MOUTH ONCE DAILY. 12/04/15  Yes Fayrene Helper, MD  fenofibrate (TRICOR) 145 MG tablet Take 1 tablet (145 mg total) by mouth daily. 10/13/15  Yes Fayrene Helper, MD  furosemide (LASIX) 40 MG tablet TAKE 1 TABLET BY MOUTH EVERY MORNING. 12/04/15  Yes Fayrene Helper, MD  glipiZIDE (GLUCOTROL XL) 5 MG 24 hr tablet TAKE 1 TABLET BY MOUTH DAILY WITH BREAKFAST. 10/13/15  Yes Fayrene Helper, MD  insulin aspart protamine - aspart (NOVOLOG MIX 70/30 FLEXPEN) (70-30) 100 UNIT/ML FlexPen Inject 0.12 mLs (12 Units total)  into the skin 2 (two) times daily. 12/22/15  Yes Fayrene Helper, MD  Multiple Vitamins-Minerals (MULTIVITAMIN PO) Take 1 tablet by mouth daily.   Yes Historical Provider, MD  niacin (NIASPAN) 500 MG CR tablet Take 1 tablet (500 mg total) by mouth at bedtime. 10/12/15  Yes Fayrene Helper, MD  potassium chloride (K-DUR) 10 MEQ tablet TAKE ONE TABLET BY MOUTH DAILY. DO NOT TAKE IF NOT TAKING FUROSEMIDE. 11/19/15  Yes Fayrene Helper, MD  spironolactone (ALDACTONE) 25 MG tablet Take 1 tablet (25 mg total) by mouth daily. 08/18/15  Yes Fayrene Helper, MD  azelastine (ASTELIN)  0.1 % nasal spray Place 1 spray into both nostrils 2 (two) times daily. Use in each nostril as directed 08/13/15   Fayrene Helper, MD  UNIFINE PENTIPS 31G X 8 MM MISC USE AS NEEDED TO INJECT INSULIN. 05/22/15   Fayrene Helper, MD    ALLERGIES:  Allergies  Allergen Reactions  . Crestor [Rosuvastatin] Other (See Comments)    Muscle aches and elevated CK  . Penicillins Other (See Comments)    Family unsure of reaction, but certain mom said she was allergic    SOCIAL HISTORY:  Social History  Substance Use Topics  . Smoking status: Never Smoker   . Smokeless tobacco: Never Used  . Alcohol Use: No    FAMILY HISTORY: Family History  Problem Relation Age of Onset  . Heart disease Mother   . Heart attack Mother 13  . Diabetes Mother   . Hypertension Mother   . Colon cancer Father 52  . Breast cancer Sister   . Diabetes Brother   . Hypertension Brother   . Stroke Brother     EXAM: BP 133/72 mmHg  Pulse 68  Resp 18  SpO2 99% CONSTITUTIONAL: Alert and oriented and responds appropriately to questions. Elderly, in no distress, afebrile HEAD: Normocephalic EYES: Conjunctivae clear, PERRL ENT: normal nose; no rhinorrhea; moist mucous membranes NECK: Supple, no meningismus, no LAD  CARD: RRR; S1 and S2 appreciated; patient has a systolic murmur, no clicks, no rubs, no gallops RESP: Normal chest excursion without splinting or tachypnea; breath sounds clear and equal bilaterally; no wheezes, no rhonchi, no rales, no hypoxia or respiratory distress, speaking full sentences ABD/GI: Normal bowel sounds; non-distended; soft, non-tender, no rebound, no guarding, no peritoneal signs BACK:  The back appears normal and is non-tender to palpation, there is no CVA tenderness EXT: Normal ROM in all joints; non-tender to palpation; no edema; normal capillary refill; no cyanosis, no calf tenderness or swelling    SKIN: Normal color for age and race; warm; no rash NEURO: Moves all  extremities equally, sensation to light touch intact diffusely, cranial nerves II through XII intact PSYCH: The patient's mood and manner are appropriate. Grooming and personal hygiene are appropriate.  MEDICAL DECISION MAKING: Patient here with chest pain. She has multiple risk factors for ACS. Currently chest pain-free. Hemodynamically stable. Does not appear volume overloaded. EKG shows left bundle branch block without any new ischemic abnormality. First troponin is negative. Chest x-ray is clear. Doubt PE or dissection given she is pain-free. Have recommended admission to telemetry, observation for chest pain rule out. Patient agrees with this plan. We'll give aspirin.  ED PROGRESS: 2:30 AM  Discussed patient's case with hospitalist, Dr. Shanon Brow.  Recommend admission to telemetry, observation bed.  I will place holding orders per their request. Patient and family (if present) updated with plan. Care transferred to hospitalist service.  I  reviewed all nursing notes, vitals, pertinent old records, EKGs, labs, imaging (as available).     EKG Interpretation  Date/Time:  Friday Jan 01 2016 00:37:43 EDT Ventricular Rate:  69 PR Interval:  147 QRS Duration: 167 QT Interval:  496 QTC Calculation: 531 R Axis:   -12 Text Interpretation:  Sinus rhythm Probable left atrial enlargement IVCD, consider atypical LBBB Baseline wander in lead(s) II aVF No significant change since last tracing Confirmed by Vidal Lampkins,  DO, Phebe Dettmer [phone]) on 01/01/2016 12:42:22 AM        [place], DO 01/01/16 [id number]

## 2016-01-01 NOTE — Discharge Summary (Signed)
Physician Discharge Summary  [name] [name] [id number] DOB: 1945/12/10 DOA: 01/01/2016  PCP: [name] Nakayama, MD  Admit date: 01/01/2016 Discharge date: 01/01/2016  Time spent: 45 minutes  Recommendations for Outpatient Follow-up:  -We discharge home today. -Advised to follow-up with cardiology in 2 weeks for consideration of stress testing.   Discharge Diagnoses:  Principal Problem:   Chest pain Active Problems:   Essential hypertension, benign   Nonischemic cardiomyopathy (HCC)   CKD (chronic kidney disease) stage 3, GFR 30-59 ml/min   LBBB (left bundle branch block)   Chronic combined systolic and diastolic CHF (congestive heart failure) (HCC)   S/P TAVR (transcatheter aortic valve replacement)   Discharge Condition: Stable and improved  Filed Weights   01/01/16 0408  Weight: 85.412 kg (188 lb 4.8 oz)    History of present illness:  As per Dr. Shanon [name] on 5/19: 70 yo female h/o AVR for severe AS, HTN, IDDM, HLD , CKD comes in with over 2 hours of waxing /waning sscp that radiated to her left breast area with no associated nausea or diaphoresis. No alleviating or worsening factors. Pt denies any recent illnesses. No fevers. No cough. No swelling in her legs or pain in her legs. No n/v/d. No abdominal pain. Pt referred for concerns of possible ACS for observation in a patient with many cardiac risk factors. Pt denies any issues right now, is chest pain free.  Hospital Course:   Chest pain -Ruled out for acute coronary syndrome with negative troponins and EKG without acute ischemic changes. -Because she does have multiple coronary artery disease risk factors I have recommended that she follow-up with her cardiologist in 2 weeks for consideration of stress testing. -She has not had further chest pain while in the hospital and has been deemed safe for discharge.  Procedures:  None   Consultations:  None  Discharge Instructions  Discharge Instructions      Diet - low sodium heart healthy    Complete by:  As directed      Increase activity slowly    Complete by:  As directed             Medication List    STOP taking these medications        potassium chloride 10 MEQ tablet  Commonly known as:  K-DUR      TAKE these medications        acetaminophen 500 MG tablet  Commonly known as:  TYLENOL  Take 1,000 mg by mouth every 6 (six) hours as needed for moderate pain.     aspirin EC 81 MG tablet  Take 81 mg by mouth daily.     azelastine 0.1 % nasal spray  Commonly known as:  ASTELIN  Place 1 spray into both nostrils 2 (two) times daily. Use in each nostril as directed     carvedilol 25 MG tablet  Commonly known as:  COREG  TAKE 1 TABLET BY MOUTH TWICE DAILY WITH MEALS.     clopidogrel 75 MG tablet  Commonly known as:  PLAVIX  Take 1 tablet (75 mg total) by mouth daily with breakfast.     EASYMAX TEST test strip  Generic drug:  glucose blood  Check blood sugar three times daily dx E10.9     ezetimibe 10 MG tablet  Commonly known as:  ZETIA  TAKE ONE TABLET BY MOUTH ONCE DAILY.     fenofibrate 145 MG tablet  Commonly known as:  TRICOR  Take 1 tablet (  145 mg total) by mouth daily.     furosemide 40 MG tablet  Commonly known as:  LASIX  TAKE 1 TABLET BY MOUTH EVERY MORNING.     glipiZIDE 5 MG 24 hr tablet  Commonly known as:  GLUCOTROL XL  TAKE 1 TABLET BY MOUTH DAILY WITH BREAKFAST.     insulin aspart protamine - aspart (70-30) 100 UNIT/ML FlexPen  Commonly known as:  NOVOLOG MIX 70/30 FLEXPEN  Inject 0.12 mLs (12 Units total) into the skin 2 (two) times daily.     MULTIVITAMIN PO  Take 1 tablet by mouth daily.     niacin 500 MG CR tablet  Commonly known as:  NIASPAN  Take 1 tablet (500 mg total) by mouth at bedtime.     spironolactone 25 MG tablet  Commonly known as:  ALDACTONE  Take 1 tablet (25 mg total) by mouth daily.     UNIFINE PENTIPS 31G X 8 MM Misc  Generic drug:  Insulin Pen Needle  USE AS  NEEDED TO INJECT INSULIN.       Allergies  Allergen Reactions  . Crestor [Rosuvastatin] Other (See Comments)    Muscle aches and elevated CK  . Penicillins Other (See Comments)    Family unsure of reaction, but certain mom said she was allergic       Follow-up Information    Follow up with [name] Sable, MD On 01/15/2016.   Specialty:  Cardiology   Why:  @ 1:50   Contact information:   [place] [id number] [phone]        The results of significant diagnostics from this hospitalization (including imaging, microbiology, ancillary and laboratory) are listed below for reference.    Significant Diagnostic Studies: Dg Chest Portable 1 View  01/01/2016  CLINICAL DATA:  Chest pain to LEFT this morning, history hypertension, hyperlipidemia, diabetes mellitus, non ischemic cardiomyopathy, chronic systolic heart failure EXAM: PORTABLE CHEST 1 VIEW COMPARISON:  Portable exam 0043 hours compared to 03/26/2015 FINDINGS: Enlargement of cardiac silhouette with pulmonary vascular congestion. Minimal subsegmental atelectasis at RIGHT middle lobe. Lungs otherwise clear. No pleural effusion or pneumothorax. Bones demineralized. IMPRESSION: Enlargement of cardiac silhouette with pulmonary vascular congestion. Minimal subsegmental atelectasis RIGHT middle lobe. Electronically Signed   By: [name] [name] M.D.   On: 01/01/2016 00:57    Microbiology: No results found for this or any previous visit (from the past 240 hour(s)).   Labs: Basic Metabolic Panel:  Recent Labs Lab 01/01/16 0041  NA 136  K 3.3*  CL 103  CO2 26  GLUCOSE 143*  BUN 30*  CREATININE 1.86*  CALCIUM 9.4   Liver Function Tests:  Recent Labs Lab 01/01/16 0041  AST 23  ALT 19  ALKPHOS 45  BILITOT 0.4  PROT 7.7  ALBUMIN 4.3   No results for input(s): LIPASE, AMYLASE in the last 168 hours. No results for input(s): AMMONIA in the last 168 hours. CBC:  Recent Labs Lab 01/01/16 0041  WBC 5.4    NEUTROABS 2.3  HGB 10.9*  HCT 34.2*  MCV 78.8  PLT 178   Cardiac Enzymes:  Recent Labs Lab 01/01/16 0041 01/01/16 0645 01/01/16 0909 01/01/16 1245  TROPONINI 0.03 0.03 0.03 0.03   BNP: BNP (last 3 results)  Recent Labs  01/01/16 0041  BNP 186.0*    ProBNP (last 3 results) No results for input(s): PROBNP in the last 8760 hours.  CBG:  Recent Labs Lab 01/01/16 0136  GLUCAP 139*  SignedLelon Frohlich  [facility] Pager: [phone] 01/01/2016, 5:15 PM

## 2016-01-01 NOTE — Care Management Obs Status (Signed)
[facility] NOTIFICATION   Patient Details  Name: [name] [name] MRN: [id number] Date of Birth: [birth year]   Medicare Observation Status Notification Given:  No (dishcarged <24 hrs)    Sherald Barge, RN 01/01/2016, 12:35 PM

## 2016-01-01 NOTE — ED Notes (Signed)
Pt reports intemittent burning epigastric pain since last night.  Pt states she has had a valve replacement and felt she needed to get this checked out.  Pt denies other complaints, states is pain free at this time.

## 2016-01-01 NOTE — H&P (Signed)
PCP:   [name], MD   Chief Complaint:  Chest pain  HPI: 70 yo female h/o AVR for severe AS, HTN, IDDM, HLD , CKD comes in with over 2 hours of waxing /waning sscp that radiated to her left breast area with no associated nausea or diaphoresis.  No alleviating or worsening factors.  Pt denies any recent illnesses.  No fevers.  No cough.  No swelling in her legs or pain in her legs.  No n/v/d.  No abdominal pain.  Pt referred for concerns of possible ACS for observation in a patient with many cardiac risk factors.  Pt denies any issues right now, is chest pain free.  Review of Systems:  Positive and negative as per HPI otherwise all other systems are negative  Past Medical History: Past Medical History  Diagnosis Date  . Essential hypertension, benign   . Hyperlipidemia   . Anxiety   . Aortic stenosis   . Symptomatic PVCs   . Nonischemic cardiomyopathy (HCC)     No significant obstructive CAD at heart catheterization 05/2014, LVEF 20%  . CKD (chronic kidney disease) stage 3, GFR 30-59 ml/min   . Chronic systolic heart failure ([place])   . Obesity   . Depression   . Constipation   . Incidental pulmonary nodule, > 59mm and < 1mm     7 x 4 mm LLL nodule  . Arthritis   . LBBB (left bundle branch block)   . Pneumonia 2013  . Diabetes mellitus, type 2 ([place])   . 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 Cesarean section  1987  . Multiple extractions with alveoloplasty N/A 02/27/2014    Procedure: Extraction of tooth #'s 2,4,5,6,7,8,9,10,11,12, 22, 23, 24, 25, 26, 28 with alveoloplasty and bilateral mandibular tori reductions;  Surgeon: [name], DDS;  Location: [facility];  Service: Oral Surgery;  Laterality: N/A;  . Tubal ligation  1987  . Video bronchoscopy Bilateral 06/05/2014    Procedure: VIDEO  BRONCHOSCOPY WITHOUT FLUORO;  Surgeon: [name], MD;  Location: [facility];  Service: Cardiopulmonary;  Laterality: Bilateral;  . Left and right heart catheterization with coronary angiogram N/A 12/05/2011    Procedure: LEFT AND RIGHT HEART CATHETERIZATION WITH CORONARY ANGIOGRAM;  Surgeon: [name], MD;  Location: [facility] CATH LAB;  Service: Cardiovascular;  Laterality: N/A;  . Left and right heart catheterization with coronary angiogram N/A 05/23/2014    Procedure: LEFT AND RIGHT HEART CATHETERIZATION WITH CORONARY ANGIOGRAM;  Surgeon: [name], MD;  Location: [facility] CATH LAB;  Service: Cardiovascular;  Laterality: N/A;  . Cardiac catheterization    . Flexible bronchoscopy N/A 01/14/2015    Procedure: FLEXIBLE BRONCHOSCOPY;  Surgeon: [name], MD;  Location: [facility];  Service: Thoracic;  Laterality: N/A;  . Rigid bronchoscopy N/A 01/14/2015    Procedure: RIGID BRONCHOSCOPY with Removal Of Foreign Body ;  Surgeon: [name], MD;  Location: [facility];  Service: Thoracic;  Laterality: N/A;  . Transcatheter aortic valve replacement, transfemoral Bilateral 03/24/2015    Procedure: TRANSCATHETER AORTIC VALVE REPLACEMENT, TRANSFEMORAL;  Surgeon: [name], MD;  Location: [facility];  Service: Open Heart Surgery;  Laterality: Bilateral;  . Tee without cardioversion N/A 03/24/2015    Procedure: TRANSESOPHAGEAL ECHOCARDIOGRAM (TEE);  Surgeon: [name], MD;  Location: [facility];  Service: Open  Heart Surgery;  Laterality: N/A;    Medications: Prior to Admission medications   Medication Sig Start Date End Date Taking? Authorizing Provider  acetaminophen (TYLENOL) 500 MG tablet Take 1,000 mg by mouth every 6 (six) hours as needed for moderate pain.   Yes Historical Provider, MD  aspirin EC 81 MG tablet Take 81 mg by mouth daily.   Yes Historical Provider, MD  carvedilol (COREG) 25 MG tablet TAKE 1 TABLET BY MOUTH TWICE DAILY WITH MEALS. 09/03/15  Yes  [name], MD  clopidogrel (PLAVIX) 75 MG tablet Take 1 tablet (75 mg total) by mouth daily with breakfast. 07/30/15  Yes [name], MD  [facility] TEST test strip Check blood sugar three times daily dx E10.9 04/24/15  Yes Fayrene Helper, MD  ezetimibe (ZETIA) 10 MG tablet TAKE ONE TABLET BY MOUTH ONCE DAILY. 12/04/15  Yes Fayrene Helper, MD  fenofibrate (TRICOR) 145 MG tablet Take 1 tablet (145 mg total) by mouth daily. 10/13/15  Yes Fayrene Helper, MD  furosemide (LASIX) 40 MG tablet TAKE 1 TABLET BY MOUTH EVERY MORNING. 12/04/15  Yes Fayrene Helper, MD  glipiZIDE (GLUCOTROL XL) 5 MG 24 hr tablet TAKE 1 TABLET BY MOUTH DAILY WITH BREAKFAST. 10/13/15  Yes Fayrene Helper, MD  insulin aspart protamine - aspart (NOVOLOG MIX 70/30 FLEXPEN) (70-30) 100 UNIT/ML FlexPen Inject 0.12 mLs (12 Units total) into the skin 2 (two) times daily. 12/22/15  Yes Fayrene Helper, MD  Multiple Vitamins-Minerals (MULTIVITAMIN PO) Take 1 tablet by mouth daily.   Yes Historical Provider, MD  niacin (NIASPAN) 500 MG CR tablet Take 1 tablet (500 mg total) by mouth at bedtime. 10/12/15  Yes Fayrene Helper, MD  potassium chloride (K-DUR) 10 MEQ tablet TAKE ONE TABLET BY MOUTH DAILY. DO NOT TAKE IF NOT TAKING FUROSEMIDE. 11/19/15  Yes Fayrene Helper, MD  spironolactone (ALDACTONE) 25 MG tablet Take 1 tablet (25 mg total) by mouth daily. 08/18/15  Yes Fayrene Helper, MD  azelastine (ASTELIN) 0.1 % nasal spray Place 1 spray into both nostrils 2 (two) times daily. Use in each nostril as directed 08/13/15   Fayrene Helper, MD  UNIFINE PENTIPS 31G X 8 MM MISC USE AS NEEDED TO INJECT INSULIN. 05/22/15   Fayrene Helper, MD    Allergies:   Allergies  Allergen Reactions  . Crestor [Rosuvastatin] Other (See Comments)    Muscle aches and elevated CK  . Penicillins Other (See Comments)    Family unsure of reaction, but certain mom said she was allergic    Social History:  reports that  she has never smoked. She has never used smokeless tobacco. She reports that she does not drink alcohol or use illicit drugs.  Family History: Family History  Problem Relation Age of Onset  . Heart disease Mother   . Heart attack Mother 81  . Diabetes Mother   . Hypertension Mother   . Colon cancer Father 49  . Breast cancer Sister   . Diabetes Brother   . Hypertension Brother   . Stroke Brother     Physical Exam: Filed Vitals:   01/01/16 0130 01/01/16 0149 01/01/16 0220 01/01/16 0230  BP: 129/79  127/68 131/77  Pulse: 66  64 67  Temp:  98.2 F (36.8 C)    Resp: 18  15 18   SpO2: 100%  99% 100%   General appearance: alert, cooperative and no distress Head: Normocephalic, without obvious abnormality, atraumatic Eyes: negative Nose:  Nares normal. Septum midline. Mucosa normal. No drainage or sinus tenderness. Neck: no JVD and supple, symmetrical, trachea midline Lungs: clear to auscultation bilaterally Heart: regular rate and rhythm, S1, S2 normal, no murmur, click, rub or gallop Abdomen: soft, non-tender; bowel sounds normal; no masses,  no organomegaly Extremities: extremities normal, atraumatic, no cyanosis or edema Pulses: 2+ and symmetric Skin: Skin color, texture, turgor normal. No rashes or lesions Neurologic: Grossly normal    Labs on Admission:   Recent Labs  01/01/16 0041  NA 136  K 3.3*  CL 103  CO2 26  GLUCOSE 143*  BUN 30*  CREATININE 1.86*  CALCIUM 9.4    Recent Labs  01/01/16 0041  AST 23  ALT 19  ALKPHOS 45  BILITOT 0.4  PROT 7.7  ALBUMIN 4.3    Recent Labs  01/01/16 0041  WBC 5.4  NEUTROABS 2.3  HGB 10.9*  HCT 34.2*  MCV 78.8  PLT 178    Recent Labs  01/01/16 0041  TROPONINI 0.03   Radiological Exams on Admission: Dg Chest Portable 1 View  01/01/2016  CLINICAL DATA:  Chest pain to LEFT this morning, history hypertension, hyperlipidemia, diabetes mellitus, non ischemic cardiomyopathy, chronic systolic heart failure  EXAM: PORTABLE CHEST 1 VIEW COMPARISON:  Portable exam 0043 hours compared to 03/26/2015 FINDINGS: Enlargement of cardiac silhouette with pulmonary vascular congestion. Minimal subsegmental atelectasis at RIGHT middle lobe. Lungs otherwise clear. No pleural effusion or pneumothorax. Bones demineralized. IMPRESSION: Enlargement of cardiac silhouette with pulmonary vascular congestion. Minimal subsegmental atelectasis RIGHT middle lobe. Electronically Signed   By: Lavonia Dana M.D.   On: 01/01/2016 00:57   ekg reviewed LBBB old, c/w old ekgs  Assessment/Plan  70 yo female with multiple risk factors with atypical chest pain  Principal Problem:   Chest pain- atypcial.  romi with serial troponin levels overnight.  Check cardiac echo in the am.  If work up neg, would have close follow up with her cardiologist as an outpatient for further evaluation, or in house if pain recurs.    Active Problems:  Stable unless o/w noted   Essential hypertension, benign   Nonischemic cardiomyopathy (HCC)   CKD (chronic kidney disease) stage 3, GFR 30-59 ml/min   LBBB (left bundle branch block)   Chronic combined systolic and diastolic CHF (congestive heart failure) (HCC)   S/P TAVR (transcatheter aortic valve replacement)  obs on tele.  Full code.  Cathleen Yagi A 01/01/2016, 3:47 AM

## 2016-01-02 NOTE — Progress Notes (Signed)
Patient discharged with instructions, prescription, and care notes.  Verbalized understanding via teach back.  IV was removed and the site was WNL. Patient voiced no further complaints or concerns at the time of discharge.  Appointments scheduled per instructions.  Patient left the floor via w/c with staff and family in stable condition. 

## 2016-01-04 ENCOUNTER — Other Ambulatory Visit: Payer: Self-pay | Admitting: Family Medicine

## 2016-01-15 ENCOUNTER — Ambulatory Visit (INDEPENDENT_AMBULATORY_CARE_PROVIDER_SITE_OTHER): Payer: PPO | Admitting: Adult Health

## 2016-01-15 ENCOUNTER — Encounter: Payer: Self-pay | Admitting: Adult Health

## 2016-01-15 VITALS — BP 116/72 | HR 80 | Ht 66.0 in | Wt 188.4 lb

## 2016-01-15 DIAGNOSIS — I10 Essential (primary) hypertension: Secondary | ICD-10-CM

## 2016-01-15 DIAGNOSIS — I350 Nonrheumatic aortic (valve) stenosis: Secondary | ICD-10-CM

## 2016-01-15 DIAGNOSIS — I420 Dilated cardiomyopathy: Secondary | ICD-10-CM | POA: Diagnosis not present

## 2016-01-15 NOTE — Progress Notes (Signed)
Name: [name] [name]    DOB: 1946-08-07  Age: 70 y.o.  MR#: [id number]       PCP:  [name], MD      Insurance: Payor: Tenn[employer] Must / Plan: Tennis Must / Product Type: *No Product type* /   CC:    Chief Complaint  Patient presents with  . Cardiomyopathy    VS Filed Vitals:   01/15/16 1336  BP: 116/72  Pulse: 80  Height: 5\' 6"  (1.676 m)  Weight: 188 lb 6.4 oz (85.458 kg)  SpO2: 97%    Weights Current Weight  01/15/16 188 lb 6.4 oz (85.458 kg)  01/01/16 188 lb 4.8 oz (85.412 kg)  11/13/15 187 lb (84.823 kg)    Blood Pressure  BP Readings from Last 3 Encounters:  01/15/16 116/72  01/01/16 124/78  11/13/15 108/64     Admit date:  (Not on file) Last encounter with RMR:  09/03/2015   Allergy Crestor and Penicillins  Current Outpatient Prescriptions  Medication Sig Dispense Refill  . acetaminophen (TYLENOL) 500 MG tablet Take 1,000 mg by mouth every 6 (six) hours as needed for moderate pain.    Marland Kitchen aspirin EC 81 MG tablet Take 81 mg by mouth daily.    Marland Kitchen azelastine (ASTELIN) 0.1 % nasal spray Place 1 spray into both nostrils 2 (two) times daily. Use in each nostril as directed 30 mL 6  . carvedilol (COREG) 25 MG tablet TAKE 1 TABLET BY MOUTH TWICE DAILY WITH MEALS. 60 tablet 6  . clopidogrel (PLAVIX) 75 MG tablet Take 1 tablet (75 mg total) by mouth daily with breakfast. 30 tablet 6  . EASYMAX TEST test strip Check blood sugar three times daily dx E10.9 100 each 5  . ezetimibe (ZETIA) 10 MG tablet TAKE ONE TABLET BY MOUTH ONCE DAILY. 30 tablet 3  . fenofibrate (TRICOR) 145 MG tablet Take 1 tablet (145 mg total) by mouth daily. 30 tablet 3  . furosemide (LASIX) 40 MG tablet TAKE 1 TABLET BY MOUTH EVERY MORNING. 30 tablet 0  . glipiZIDE (GLUCOTROL XL) 5 MG 24 hr tablet TAKE 1 TABLET BY MOUTH DAILY WITH BREAKFAST. 30 tablet 3  . insulin aspart protamine - aspart (NOVOLOG MIX 70/30 FLEXPEN) (70-30) 100 UNIT/ML FlexPen Inject 0.12 mLs (12 Units total) into  the skin 2 (two) times daily. 15 mL 11  . Multiple Vitamins-Minerals (MULTIVITAMIN PO) Take 1 tablet by mouth daily.    . niacin (NIASPAN) 500 MG CR tablet Take 1 tablet (500 mg total) by mouth at bedtime. 30 tablet 5  . spironolactone (ALDACTONE) 25 MG tablet Take 1 tablet (25 mg total) by mouth daily. 90 tablet 3  . UNIFINE PENTIPS 31G X 8 MM MISC USE AS NEEDED TO INJECT INSULIN. 100 each 5  . [DISCONTINUED] cetirizine (ZYRTEC) 10 MG tablet Take 1 tablet (10 mg total) by mouth daily. 30 tablet 3  . [DISCONTINUED] ferrous sulfate 325 (65 FE) MG EC tablet Take 1 tablet (325 mg total) by mouth 2 (two) times daily. 60 tablet 5  . [DISCONTINUED] FLUoxetine (PROZAC) 10 MG tablet Take 1 tablet (10 mg total) by mouth daily. Take one capsule by mouth once a day 30 tablet 3  . [DISCONTINUED] fluticasone (FLONASE) 50 MCG/ACT nasal spray Place 2 sprays into the nose daily. 16 g 3   No current facility-administered medications for this visit.    Discontinued Meds:   There are no discontinued medications.  Patient Active Problem List   Diagnosis Date Noted  .  Chest pain 01/01/2016  . Pain in the chest   . Depression 08/23/2015  . Annual physical exam 08/18/2015  . 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 Severe aortic valve stenosis 03/24/2015  . Knee pain, left 12/16/2014  . Hyperlipidemia LDL goal <100 05/24/2014  . Severe aortic stenosis 05/23/2014  . Incidental pulmonary nodule, > 58mm and < 31mm 02/10/2014  . Chronic combined systolic and diastolic CHF (congestive heart failure) ([place])   . LBBB (left bundle branch block) 12/03/2013  . CKD (chronic kidney disease) stage 3, GFR 30-59 ml/min 05/21/2013  . Chronic systolic heart failure ([place]) 04/17/2012  . Seasonal allergies 02/22/2012  . Essential hypertension, benign   . Aortic stenosis   . Nonischemic cardiomyopathy ([place]) 10/14/2011  . Insomnia 03/22/2011  . 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 Obesity 10/24/2007    LABS    Component Value Date/Time   NA 136 01/01/2016 0041   NA 139 10/07/2015 0812   NA 142 07/29/2015 0842   K 3.3* 01/01/2016 0041   K 4.2 10/07/2015 0812   K 4.3 07/29/2015 0842   CL 103 01/01/2016 0041   CL 104 10/07/2015 0812   CL 105 07/29/2015 0842   CO2 26 01/01/2016 0041   CO2 23 10/07/2015 0812   CO2 27 07/29/2015 0842   GLUCOSE 143* 01/01/2016 0041   GLUCOSE 208* 10/07/2015 0812   GLUCOSE 139* 07/29/2015 0842   BUN 30* 01/01/2016 0041   BUN 28* 10/07/2015 0812   BUN 32* 07/29/2015 0842   CREATININE 1.86* 01/01/2016 0041   CREATININE 1.82* 10/07/2015 0812   CREATININE 1.69* 07/29/2015 0842   CREATININE 1.79* 06/11/2015 0829   CREATININE 1.78* 03/27/2015 0247   CREATININE 1.74* 03/26/2015 0319   CALCIUM 9.4 01/01/2016 0041   CALCIUM 9.4 10/07/2015 0812   CALCIUM 9.9 07/29/2015 0842   GFRNONAA 26* 01/01/2016 0041   GFRNONAA 28* 10/07/2015 0812   GFRNONAA 30* 07/29/2015 0842   GFRNONAA 29* 06/11/2015 0829   GFRNONAA 28* 03/27/2015 0247   GFRNONAA 29* 03/26/2015 0319   GFRAA 31* 01/01/2016 0041   GFRAA 32* 10/07/2015 0812   GFRAA 35* 07/29/2015 0842   GFRAA 33* 06/11/2015 0829   GFRAA 32* 03/27/2015 0247   GFRAA 33* 03/26/2015 0319   CMP     Component Value Date/Time   NA 136 01/01/2016 0041   K 3.3* 01/01/2016 0041   CL 103 01/01/2016 0041   CO2 26 01/01/2016 0041   GLUCOSE 143* 01/01/2016 0041   BUN 30* 01/01/2016 0041   CREATININE 1.86* 01/01/2016 0041   CREATININE 1.82* 10/07/2015 0812   CALCIUM 9.4 01/01/2016 0041   PROT 7.7 01/01/2016 0041   ALBUMIN 4.3 01/01/2016 0041   AST 23 01/01/2016 0041   ALT 19 01/01/2016 0041   ALKPHOS 45 01/01/2016 0041   BILITOT 0.4 01/01/2016 0041   GFRNONAA 26* 01/01/2016 0041   GFRNONAA 28* 10/07/2015 0812   GFRAA 31* 01/01/2016 0041   GFRAA 32* 10/07/2015 0812       Component Value Date/Time   WBC 5.4 01/01/2016 0041   WBC 6.9 03/26/2015 0319    WBC 6.6 03/25/2015 0409   HGB 10.9* 01/01/2016 0041   HGB 11.0* 03/26/2015 0319   HGB 10.8* 03/25/2015 0409   HCT 34.2* 01/01/2016 0041   HCT 34.2* 03/26/2015 0319   HCT 33.0* 03/25/2015 0409   MCV 78.8 01/01/2016 0041   MCV 81.2 03/26/2015 0319   MCV 80.3  03/25/2015 0409    Lipid Panel     Component Value Date/Time   CHOL 227* 07/29/2015 0841   TRIG 70 07/29/2015 0841   HDL 78 07/29/2015 0841   CHOLHDL 2.9 07/29/2015 0841   VLDL 14 07/29/2015 0841   LDLCALC 135* 07/29/2015 0841    ABG    Component Value Date/Time   PHART 7.341* 03/24/2015 1356   PCO2ART 38.4 03/24/2015 1356   PO2ART 67.0* 03/24/2015 1356   HCO3 21.0 03/24/2015 1356   TCO2 22 03/24/2015 1356   ACIDBASEDEF 5.0* 03/24/2015 1356   O2SAT 93.0 03/24/2015 1356     Lab Results  Component Value Date   TSH 3.88 10/07/2015   BNP (last 3 results)  Recent Labs  01/01/16 0041  BNP 186.0*    ProBNP (last 3 results) No results for input(s): PROBNP in the last 8760 hours.  Cardiac Panel (last 3 results) No results for input(s): CKTOTAL, CKMB, TROPONINI, RELINDX in the last 72 hours.  Iron/TIBC/Ferritin/ %Sat    Component Value Date/Time   IRON 47 05/20/2013 0918   TIBC 342 12/05/2011 0440   FERRITIN 21 12/05/2011 0440   IRONPCTSAT 8* 12/05/2011 0440   IRONPCTSAT 7* 06/30/2011 0000     EKG Orders placed or performed during the hospital encounter of 01/01/16  . EKG 12-Lead  . EKG 12-Lead  . EKG     Prior Assessment and Plan Problem List as of 01/15/2016      Cardiovascular and Mediastinum   Essential hypertension, benign   Last Assessment & Plan 10/12/2015 Office Visit Written 10/12/2015 11:12 PM by Fayrene Helper, MD    Controlled, no change in medication DASH diet and commitment to daily physical activity for a minimum of 30 minutes discussed and encouraged, as a part of hypertension management. The importance of attaining a healthy weight is also discussed.  BP/Weight 10/12/2015 08/18/2015  07/30/2015 06/04/2015 05/06/2015 04/29/2015 [id number]  Systolic BP 123456 0000000 123456 [id number]  Diastolic BP 74 76 66 77 80 62 78  Wt. (Lbs) 185 189 184 184.53 184.53 183 184  BMI 30.79 31.45 30.62 29.8 29.8 30.45 32.6            Aortic stenosis   Last Assessment & Plan 03/12/2015 Office Visit Written 03/14/2015  4:17 PM by Fayrene Helper, MD    Anticipates valve replacement in the next 4 to 6 weeks      Nonischemic cardiomyopathy [facility])   Last Assessment & Plan 04/29/2014 Office Visit Written 04/29/2014  2:08 PM by [name], MD    Patient had no significant CAD at catheterization in 2013, reportedly mild plaque in the mid RCA by chest CT (not gated study for coronary anatomy however). Uncertain if diagnostic coronary angiography will be requested prior to surgery.      Chronic systolic heart failure [facility])   Last Assessment & Plan 12/09/2014 Office Visit Written 12/16/2014 10:51 PM by Fayrene Helper, MD    Stable, no current s/s of decompensation      LBBB (left bundle branch block)   Last Assessment & Plan 12/27/2013 Office Visit Written 12/27/2013 11:47 AM by [name], MD    Long-standing. Patient has had previous EP evaluation by [name] and declined ICD therapies.      Chronic combined systolic and diastolic CHF (congestive heart failure) [facility])   Last Assessment & Plan 03/12/2015 Office Visit Written 03/14/2015  4:18 PM by Fayrene Helper, MD    Compensated well,  asymptomatic, denies PND, orthopnea or leg swelling      Severe aortic stenosis   Severe aortic valve stenosis     Respiratory   Seasonal allergies   Last Assessment & Plan 05/21/2013 Office Visit Written 05/26/2013  5:25 PM by Fayrene Helper, MD    Controlled, no change in medication         Endocrine   Uncontrolled secondary diabetes mellitus with stage 4 CKD (GFR 15-29) [facility])   Last Assessment & Plan 10/12/2015 Office Visit Written 10/12/2015 11:13 PM by Fayrene Helper, MD     Marked improvement and at goal Ms. Siron is reminded of the importance of commitment to daily physical activity for 30 minutes or more, as able and the need to limit carbohydrate intake to 30 to 60 grams per meal to help with blood sugar control.   The need to take medication as prescribed, test blood sugar as directed, and to call between visits if there is a concern that blood sugar is uncontrolled is also discussed.   Ms. Kady is reminded of the importance of daily foot exam, annual eye examination, and good blood sugar, blood pressure and cholesterol control.  Diabetic Labs Latest Ref Rng 10/07/2015 07/29/2015 06/11/2015 03/27/2015 03/26/2015  HbA1c <5.7 % 7.2(H) - 8.0(H) - -  Microalbumin <2.0 mg/dL - - - - -  Micro/Creat Ratio 0.0 - 30.0 mg/g - - - - -  Chol 125 - 200 mg/dL - 227(H) - - -  HDL >=46 mg/dL - 78 - - -  Calc LDL <130 mg/dL - 135(H) - - -  Triglycerides <150 mg/dL - 70 - - -  Creatinine 0.60 - 0.93 mg/dL 1.82(H) 1.69(H) 1.79(H) 1.78(H) 1.74(H)   BP/Weight 10/12/2015 08/18/2015 07/30/2015 06/04/2015 05/06/2015 04/29/2015 [id number]  Systolic BP 123456 0000000 123456 [id number]  Diastolic BP 74 76 66 77 80 62 78  Wt. (Lbs) 185 189 184 184.53 184.53 183 184  BMI 30.79 31.45 30.62 29.8 29.8 30.45 32.6   Foot/eye exam completion dates Latest Ref Rng 12/30/2014 12/09/2014  Eye Exam No Retinopathy No Retinopathy -  Foot exam Order - - -  Foot Form Completion - - Done               Genitourinary   CKD (chronic kidney disease) stage 3, GFR 30-59 ml/min   Last Assessment & Plan 12/09/2014 Office Visit Edited 12/16/2014 10:58 PM by Fayrene Helper, MD    Importance of improving blood sugar control is stressed also avoidance of nephrotoxic drugs, esp NSAIDS        Other   Obesity   Last Assessment & Plan 10/12/2015 Office Visit Written 10/12/2015 11:16 PM by Fayrene Helper, MD    Unchanged Patient re-educated about  the importance of commitment to a  minimum of 150  minutes of exercise per week.  The importance of healthy food choices with portion control discussed. Encouraged to start a food diary, count calories and to consider  joining a support group. Sample diet sheets offered. Goals set by the patient for the next several months.   Weight /BMI 10/12/2015 08/18/2015 07/30/2015  WEIGHT 185 lb 189 lb 184 lb  HEIGHT 5\' 5"  5\' 5"  5\' 5"   BMI 30.79 kg/m2 31.45 kg/m2 30.62 kg/m2    Current exercise per week 60 minutes.       Insomnia   Last Assessment & Plan 12/09/2014 Office Visit Written 12/16/2014 11:00 PM by Fayrene Helper, MD  Sleep hygiene reviewed and written information offered also. Prescription sent for  medication needed. Short term use of xanax , and she is aware and agrees      Incidental pulmonary nodule, > 38mm and < 94mm   Last Assessment & Plan 05/21/2014 Office Visit Written 05/21/2014  3:50 PM by [name], MD    She needs a repeat CT scan in December 2015. She is low risk as she has no personal history of malignancy and has never smoked.      Hyperlipidemia LDL goal <100   Last Assessment & Plan 10/12/2015 Office Visit Written 10/12/2015 11:15 PM by Fayrene Helper, MD    Hyperlipidemia:Low fat diet discussed and encouraged.   Lipid Panel  Lab Results  Component Value Date   CHOL 227* 07/29/2015   HDL 78 07/29/2015   LDLCALC 135* 07/29/2015   TRIG 70 07/29/2015   CHOLHDL 2.9 07/29/2015   Updated lab needed at/ before next visit.           Knee pain, left   Last Assessment & Plan 03/12/2015 Office Visit Written 03/14/2015  4:20 PM by Fayrene Helper, MD    Chronic osteoarthritis , however no current flare      S/P TAVR (transcatheter aortic valve replacement)   Annual physical exam   Last Assessment & Plan 08/18/2015 Office Visit Written 08/18/2015 10:43 AM by Fayrene Helper, MD    Annual exam as documented. Counseling done  re healthy lifestyle involving commitment to 150 minutes exercise per  week, heart healthy diet, and attaining healthy weight.The importance of adequate sleep also discussed. Regular seat belt use and home safety, is also discussed. Changes in health habits are decided on by the patient with goals and time frames  set for achieving them. Immunization and cancer screening needs are specifically addressed at this visit.       Depression   Last Assessment & Plan 10/12/2015 Office Visit Written 10/12/2015 11:14 PM by Fayrene Helper, MD    Resolved without medication, more interested in interacting with family and friends      Chest pain   Pain in the chest       Imaging: Dg Chest Portable 1 View  01/01/2016  CLINICAL DATA:  Chest pain to LEFT this morning, history hypertension, hyperlipidemia, diabetes mellitus, non ischemic cardiomyopathy, chronic systolic heart failure EXAM: PORTABLE CHEST 1 VIEW COMPARISON:  Portable exam 0043 hours compared to 03/26/2015 FINDINGS: Enlargement of cardiac silhouette with pulmonary vascular congestion. Minimal subsegmental atelectasis at RIGHT middle lobe. Lungs otherwise clear. No pleural effusion or pneumothorax. Bones demineralized. IMPRESSION: Enlargement of cardiac silhouette with pulmonary vascular congestion. Minimal subsegmental atelectasis RIGHT middle lobe. Electronically Signed   By: Lavonia Dana M.D.   On: 01/01/2016 00:57

## 2016-01-15 NOTE — Patient Instructions (Signed)
Your physician recommends that you schedule a follow-up appointment with Dr. [name] on August 2 at 2:00 pm  Your physician recommends that you continue on your current medications as directed. Please refer to the Current Medication list given to you today.  If you need a refill on your cardiac medications before your next appointment, please call your pharmacy.  Thank you for choosing [facility]!

## 2016-01-15 NOTE — Progress Notes (Signed)
Cardiology Office Note   Date:  01/15/2016   ID:  [name] [name], [name] [name] [name] [name] 12, 1947, MRN [id number]  PCP:  [name] Nakayama, MD  Cardiologist: [name] [name]/  [name] Sims, NP   Chief Complaint  Patient presents with  . Cardiomyopathy      History of Present Illness: [name] [name] is a 70 y.o. female who presents for ongoing assessment and management of cardiomyopathy, aortic valve stenosis with history of TAVR. Most recent echocardiogram on 01/01/2016, and revealed an EF of 20-25%, with stable prosthetic function. ICD pacemaker was discussed with the patient she did not want to pursue that. She was continued on her current medical regimen and is here for followup. She was seen in the emergency room on 01/01/2016 secondary to chest discomfort, found to be noncardiac in etiology, she was rule out for ACS. Echocardiogram was completed to evaluate for prosthetic aortic valve.  01/01/2016  Left ventricle: The cavity size was normal. Wall thickness was  increased in a pattern of mild LVH. Systolic function was  severely reduced. The estimated ejection fraction was in the  range of 20% to 25%. Doppler parameters are consistent with  abnormal left ventricular relaxation (grade 1 diastolic  dysfunction). Doppler parameters are consistent with high  ventricular filling pressure. - Regional wall motion abnormality: Dyskinesis of the apical  inferior myocardium; akinesis of the mid anteroseptal, mid  inferoseptal, basal-mid inferior, basal-mid inferolateral, apical  septal, and apical myocardium; moderate hypokinesis of the entire  anterior and mid anterolateral myocardium. - Aortic valve: 23 mm Edwards Sapien 3 prosthetic aortic valveand a and in a  noted (as per report). Appears to be functioning normally. There  was no regurgitation. Peak velocity (S): 297 cm/s. Mean gradient  (S): 18 mm Hg. - Mitral valve: Calcified annulus. Mildly thickened leaflets .  There was mild  regurgitation. - Left atrium: The atrium was mildly dilated. - Tricuspid valve: There was mild regurgitation. - Pulmonary arteries: Systolic pressure was mildly increased. PA  peak pressure: 37 mm Hg (S). - Pericardium, extracardiac: A trivial pericardial effusion was  identified.  And she is doing well but is getting tired of frequent doctors appointments. She is anxious to stop taking the Plavix. She will need to be on it through August. She has a followup appointment with Dr. Julianne [name] in August as well.other than having some bruising she denies any chest pain dyspnea or dizziness. She continues to grieve the loss of her husband.  Past Medical History  Diagnosis Date  . Essential hypertension, benign   . Hyperlipidemia   . Anxiety   . Aortic stenosis   . Symptomatic PVCs   . Nonischemic cardiomyopathy (HCC)     No significant obstructive CAD at heart catheterization 05/2014, LVEF 20%  . CKD (chronic kidney disease) stage 3, GFR 30-59 ml/min   . Chronic systolic heart failure ([place])   . Obesity   . Depression   . Constipation   . Incidental pulmonary nodule, > 43mm and < 51mm     7 x 4 mm LLL nodule  . Arthritis   . LBBB (left bundle branch block)   . Pneumonia 2013  . Diabetes mellitus, type 2 ([place])   . 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 Cesarean section  1987  . Multiple extractions with alveoloplasty N/A 02/27/2014    Procedure:  Extraction of tooth #'s 2,4,5,6,7,8,9,10,11,12, 22, 23, 24, 25, 26, 28 with alveoloplasty and bilateral mandibular tori reductions;  Surgeon: [name] [name], DDS;  Location: [place];  Service: Oral Surgery;  Laterality: N/A;  . Tubal ligation  1987  . Video bronchoscopy Bilateral 06/05/2014    Procedure: VIDEO BRONCHOSCOPY WITHOUT FLUORO;  Surgeon: [name] Doom, MD;   Location: [place];  Service: Cardiopulmonary;  Laterality: Bilateral;  . Left and right heart catheterization with coronary angiogram N/A 12/05/2011    Procedure: LEFT AND RIGHT HEART CATHETERIZATION WITH CORONARY ANGIOGRAM;  Surgeon: [name] Blanks, MD;  Location: [facility] CATH LAB;  Service: Cardiovascular;  Laterality: N/A;  . Left and right heart catheterization with coronary angiogram N/A 05/23/2014    Procedure: LEFT AND RIGHT HEART CATHETERIZATION WITH CORONARY ANGIOGRAM;  Surgeon: [name] Blanks, MD;  Location: [facility] CATH LAB;  Service: Cardiovascular;  Laterality: N/A;  . Cardiac catheterization    . Flexible bronchoscopy N/A 01/14/2015    Procedure: FLEXIBLE BRONCHOSCOPY;  Surgeon: [name] Nakayama, MD;  Location: [place];  Service: Thoracic;  Laterality: N/A;  . Rigid bronchoscopy N/A 01/14/2015    Procedure: RIGID BRONCHOSCOPY with Removal Of Foreign Body ;  Surgeon: [name] Nakayama, MD;  Location: [place];  Service: Thoracic;  Laterality: N/A;  . Transcatheter aortic valve replacement, transfemoral Bilateral 03/24/2015    Procedure: TRANSCATHETER AORTIC VALVE REPLACEMENT, TRANSFEMORAL;  Surgeon: [name] Blanks, MD;  Location: [place];  Service: Open Heart Surgery;  Laterality: Bilateral;  . Tee without cardioversion N/A 03/24/2015    Procedure: TRANSESOPHAGEAL ECHOCARDIOGRAM (TEE);  Surgeon: [name] Blanks, MD;  Location: [place];  Service: Open Heart Surgery;  Laterality: N/A;     Current Outpatient Prescriptions  Medication Sig Dispense Refill  . acetaminophen (TYLENOL) 500 MG tablet Take 1,000 mg by mouth every 6 (six) hours as needed for moderate pain.    Marland Kitchen aspirin EC 81 MG tablet Take 81 mg by mouth daily.    Marland Kitchen azelastine (ASTELIN) 0.1 % nasal spray Place 1 spray into both nostrils 2 (two) times daily. Use in each nostril as directed 30 mL 6  . carvedilol (COREG) 25 MG tablet TAKE 1 TABLET BY MOUTH TWICE DAILY WITH MEALS. 60 tablet 6  . clopidogrel (PLAVIX)  75 MG tablet Take 1 tablet (75 mg total) by mouth daily with breakfast. 30 tablet 6  . EASYMAX TEST test strip Check blood sugar three times daily dx E10.9 100 each 5  . ezetimibe (ZETIA) 10 MG tablet TAKE ONE TABLET BY MOUTH ONCE DAILY. 30 tablet 3  . fenofibrate (TRICOR) 145 MG tablet Take 1 tablet (145 mg total) by mouth daily. 30 tablet 3  . furosemide (LASIX) 40 MG tablet TAKE 1 TABLET BY MOUTH EVERY MORNING. 30 tablet 0  . glipiZIDE (GLUCOTROL XL) 5 MG 24 hr tablet TAKE 1 TABLET BY MOUTH DAILY WITH BREAKFAST. 30 tablet 3  . insulin aspart protamine - aspart (NOVOLOG MIX 70/30 FLEXPEN) (70-30) 100 UNIT/ML FlexPen Inject 0.12 mLs (12 Units total) into the skin 2 (two) times daily. 15 mL 11  . Multiple Vitamins-Minerals (MULTIVITAMIN PO) Take 1 tablet by mouth daily.    . niacin (NIASPAN) 500 MG CR tablet Take 1 tablet (500 mg total) by mouth at bedtime. 30 tablet 5  . spironolactone (ALDACTONE) 25 MG tablet Take 1 tablet (25 mg total) by mouth daily. 90 tablet 3  . UNIFINE PENTIPS 31G X 8 MM MISC USE AS NEEDED TO INJECT INSULIN. 100  each 5  . [DISCONTINUED] cetirizine (ZYRTEC) 10 MG tablet Take 1 tablet (10 mg total) by mouth daily. 30 tablet 3  . [DISCONTINUED] ferrous sulfate 325 (65 FE) MG EC tablet Take 1 tablet (325 mg total) by mouth 2 (two) times daily. 60 tablet 5  . [DISCONTINUED] FLUoxetine (PROZAC) 10 MG tablet Take 1 tablet (10 mg total) by mouth daily. Take one capsule by mouth once a day 30 tablet 3  . [DISCONTINUED] fluticasone (FLONASE) 50 MCG/ACT nasal spray Place 2 sprays into the nose daily. 16 g 3   No current facility-administered medications for this visit.    Allergies:   Crestor and Penicillins    Social History:  The patient  reports that she has never smoked. She has never used smokeless tobacco. She reports that she does not drink alcohol or use illicit drugs.   Family History:  The patient's family history includes Breast cancer in her sister; Colon cancer (age  of onset: 96) in her father; Diabetes in her brother and mother; Heart attack (age of onset: 38) in her mother; Heart disease in her mother; Hypertension in her brother and mother; Stroke in her brother.    ROS: All other systems are reviewed and negative. Unless otherwise mentioned in H&P    PHYSICAL EXAM: VS:  There were no vitals taken for this visit. , BMI There is no weight on file to calculate BMI. GEN: Well nourished, well developed, in no acute distress HEENT: normal Neck: no JVD, carotid bruits, or masses Cardiac: RRR; 2/6 holosystolic  murmur, rubs, or gallops,no edema  Respiratory:  clear to auscultation bilaterally, normal work of breathing GI: soft, nontender, nondistended, + BS MS: no deformity or atrophy Skin: warm and dry, no rash Neuro:  Strength and sensation are intact Psych: euthymic mood, full affect   Recent Labs: 06/11/2015: Magnesium 2.1 10/07/2015: TSH 3.88 01/01/2016: ALT 19; B Natriuretic Peptide 186.0*; BUN 30*; Creatinine, Ser 1.86*; Hemoglobin 10.9*; Platelets 178; Potassium 3.3*; Sodium 136    Lipid Panel    Component Value Date/Time   CHOL 227* 07/29/2015 0841   TRIG 70 07/29/2015 0841   HDL 78 07/29/2015 0841   CHOLHDL 2.9 07/29/2015 0841   VLDL 14 07/29/2015 0841   LDLCALC 135* 07/29/2015 0841      Wt Readings from Last 3 Encounters:  01/01/16 188 lb 4.8 oz (85.412 kg)  11/13/15 187 lb (84.823 kg)  10/12/15 185 lb (83.915 kg)     ASSESSMENT AND PLAN:  1. Aortic Valve Stenosis: S/P TAVR she continues on Plavix and aspirin. She is anxious to stop taking Plavix. I reassured her that when she follows up in August with Dr. [name] he [name] [name] consider taking her off of it. She is otherwise doing very well having had no recurrent complaints of chest discomfort.  2. NICM: echocardiogram as documented above. She will continue carvedilol, spironolactone and Lasix. She does not want to entertain ICD pacemaker placement at this time.she is avoiding  salty foods. She is due to see her primary care physician next week and labs will be drawn then.  3. Hypertension:blood pressure is well controlled currently. She is not on an ACE or ARB due to 2 chronic kidney disease. She is tolerating spironolactone however. Most recent lab on 01/01/2016 demonstrates a creatinine of 1.86. Potassium was 3.3 and repleted in the emergency room.   Current medicines are reviewed at length with the patient today.    Labs/ tests ordered today include:  No orders of the  defined types were placed in this encounter.     Disposition:   FU with Dr. [name] and Dr. [name] a previously scheduled appointments.   Signed, [name] Sims, NP  01/15/2016 1:36 PM    [facility] [address] [place], [place] [address] Phone: [phone]; Fax: [phone]

## 2016-01-18 ENCOUNTER — Other Ambulatory Visit: Payer: Self-pay | Admitting: Family Medicine

## 2016-02-02 ENCOUNTER — Other Ambulatory Visit: Payer: Self-pay | Admitting: Family Medicine

## 2016-02-03 ENCOUNTER — Encounter: Payer: Self-pay | Admitting: Family Medicine

## 2016-03-14 NOTE — Progress Notes (Signed)
Cardiology Office Note  Date: 03/16/2016   ID: [name], [name] [birth year], MRN [id number]  PCP: [name] Nakayama, MD  Primary Cardiologist: [name] Lesches, MD   Chief Complaint  Patient presents with  . Cardiomyopathy  . History of TAVR    History of Present Illness: [name] [name] is a 70 y.o. female last seen in June by [name]. She presents for a routine scheduled visit. Record review finds hospitalization in May with chest discomfort. She ruled out for ACS with normal troponin I levels and ECG was stable with old left bundle branch block. She does not report any recurring symptoms and describes NYHA class II dyspnea.  I reviewed her medications. Current cardiac regimen includes aspirin, Plavix, Coreg, Lasix, and Aldactone. She is not on ACE inhibitor or ARB with previous history of progressive renal insufficiency.  Recent follow-up echocardiogram in May revealed LVEF 20-25%. She has a known nonischemic cardiomyopathy. Aortic prosthesis was functioning normally with no significant perivalvular leak or regurgitation, mean gradient 18 mmHg.  She has declined ICD and we continue medical therapy and observation.  Past Medical History:  Diagnosis Date  . Anxiety   . Aortic stenosis   . Arthritis   . Chronic systolic heart failure ([place])   . CKD (chronic kidney disease) stage 3, GFR 30-59 ml/min   . Constipation   . Depression   . Diabetes mellitus, type 2 ([place])   . Essential hypertension, benign   . Hyperlipidemia   . Incidental pulmonary nodule, > 26mm and < 4mm    7 x 4 mm LLL nodule  . LBBB (left bundle branch block)   . Nonischemic cardiomyopathy (HCC)    No significant obstructive CAD at heart catheterization 05/2014, LVEF 20%  . Obesity   . Pneumonia 2013  . 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 Symptomatic PVCs     Past Surgical  History:  Procedure Laterality Date  . CARDIAC CATHETERIZATION    . CESAREAN SECTION  1987  . FLEXIBLE BRONCHOSCOPY N/A 01/14/2015   Procedure: FLEXIBLE BRONCHOSCOPY;  Surgeon: [name] Nakayama, MD;  Location: [place];  Service: Thoracic;  Laterality: N/A;  . LEFT AND RIGHT HEART CATHETERIZATION WITH CORONARY ANGIOGRAM N/A 12/05/2011   Procedure: LEFT AND RIGHT HEART CATHETERIZATION WITH CORONARY ANGIOGRAM;  Surgeon: [name] Blanks, MD;  [facility] CATH LAB;  Service: Cardiovascular;  Laterality: N/A;  . LEFT AND RIGHT HEART CATHETERIZATION WITH CORONARY ANGIOGRAM N/A 05/23/2014   Procedure: LEFT AND RIGHT HEART CATHETERIZATION WITH CORONARY ANGIOGRAM;  Surgeon: [name] Blanks, MD;  Location: [facility] CATH LAB;  Service: Cardiovascular;  Laterality: N/A;  . MULTIPLE EXTRACTIONS WITH ALVEOLOPLASTY N/A 02/27/2014   Procedure: Extraction of tooth #'s 2,4,5,6,7,8,9,10,11,12, 22, 23, 24, 25, 26, 28 with alveoloplasty and bilateral mandibular tori reductions;  Surgeon: [name], DDS;  Location: [place];  Service: Oral Surgery;  Laterality: N/A;  . RIGID BRONCHOSCOPY N/A 01/14/2015   Procedure: RIGID BRONCHOSCOPY with Removal Of Foreign Body ;  Surgeon: [name] Nakayama, MD;  Location: [place];  Service: Thoracic;  Laterality: N/A;  . TEE WITHOUT CARDIOVERSION N/A 03/24/2015   Procedure: TRANSESOPHAGEAL ECHOCARDIOGRAM (TEE);  Surgeon: [name] Blanks, MD;  Location: [place];  Service: Open Heart Surgery;  Laterality: N/A;  . TRANSCATHETER AORTIC VALVE REPLACEMENT, TRANSFEMORAL Bilateral 03/24/2015   Procedure: TRANSCATHETER AORTIC VALVE REPLACEMENT, TRANSFEMORAL;  Surgeon: [name] Brod  [name] Form, MD;  Location: [place];  Service: Open Heart Surgery;  Laterality: Bilateral;  . TUBAL LIGATION  1987  . VIDEO BRONCHOSCOPY Bilateral 06/05/2014   Procedure: VIDEO BRONCHOSCOPY WITHOUT FLUORO;  Surgeon: [name] Doom, MD;  Location: [facility] ENDOSCOPY;  Service: Cardiopulmonary;  Laterality:  Bilateral;    Current Outpatient Prescriptions  Medication Sig Dispense Refill  . acetaminophen (TYLENOL) 500 MG tablet Take 1,000 mg by mouth every 6 (six) hours as needed for moderate pain.    Marland Kitchen aspirin EC 81 MG tablet Take 81 mg by mouth daily.    Marland Kitchen azelastine (ASTELIN) 0.1 % nasal spray Place 1 spray into both nostrils 2 (two) times daily. Use in each nostril as directed 30 mL 6  . carvedilol (COREG) 25 MG tablet TAKE 1 TABLET BY MOUTH TWICE DAILY WITH MEALS. 60 tablet 6  . clopidogrel (PLAVIX) 75 MG tablet Take 1 tablet (75 mg total) by mouth daily with breakfast. 30 tablet 6  . EASYMAX TEST test strip Check blood sugar three times daily dx E10.9 100 each 5  . ezetimibe (ZETIA) 10 MG tablet TAKE ONE TABLET BY MOUTH ONCE DAILY. 30 tablet 3  . fenofibrate (TRICOR) 145 MG tablet Take 1 tablet (145 mg total) by mouth daily. 30 tablet 3  . furosemide (LASIX) 40 MG tablet TAKE 1 TABLET BY MOUTH EVERY MORNING. 30 tablet 4  . GLIPIZIDE XL 5 MG 24 hr tablet TAKE 1 TABLET BY MOUTH DAILY WITH BREAKFAST. 30 tablet 2  . insulin aspart protamine - aspart (NOVOLOG MIX 70/30 FLEXPEN) (70-30) 100 UNIT/ML FlexPen Inject 0.12 mLs (12 Units total) into the skin 2 (two) times daily. 15 mL 11  . Multiple Vitamins-Minerals (MULTIVITAMIN PO) Take 1 tablet by mouth daily.    . niacin (NIASPAN) 500 MG CR tablet Take 1 tablet (500 mg total) by mouth at bedtime. 30 tablet 5  . spironolactone (ALDACTONE) 25 MG tablet Take 1 tablet (25 mg total) by mouth daily. 90 tablet 3  . UNIFINE PENTIPS 31G X 8 MM MISC USE AS NEEDED TO INJECT INSULIN. 100 each 5   No current facility-administered medications for this visit.    Allergies:  Crestor [rosuvastatin] and Penicillins   Social History: The patient  reports that she has never smoked. She has never used smokeless tobacco. She reports that she does not drink alcohol or use drugs.   ROS:  Please see the history of present illness. Otherwise, complete review of systems is  positive for chronic fatigue.  All other systems are reviewed and negative.   Physical Exam: VS:  BP 128/80   Pulse 81   Ht 5\' 3"  (1.6 m)   Wt 191 lb (86.6 kg)   SpO2 99%   BMI 33.83 kg/m , BMI Body mass index is 33.83 kg/m.  Wt Readings from Last 3 Encounters:  03/16/16 191 lb (86.6 kg)  01/15/16 188 lb 6.4 oz (85.5 kg)  01/01/16 188 lb 4.8 oz (85.4 kg)   Overweight woman, in no distress  HEENT: Conjunctiva and lids normal, oropharynx clear.  Neck: Supple, no carotid bruits, no thyromegaly.  Lungs: Clear to auscultation, nonlabored breathing at rest.  Cardiac: Regular rate and rhythm, indistinct PMI, no S3, 3/6 systolic murmur, no pericardial rub.  Abdomen: Soft, nontender, bowel sounds present, no guarding or rebound.  Extremities: Trace edema, distal pulses 2+.  Skin: Warm and dry. Musculoskeletal: No kyphosis. Neuropsychiatric: Alert and oriented 3, affect appropriate.  ECG: I personally reviewed the tracing from 01/01/2016 which  showed sinus rhythm with left atrial enlargement and left bundle branch block.  Recent Labwork: 06/11/2015: Magnesium 2.1 10/07/2015: TSH 3.88 01/01/2016: ALT 19; AST 23; B Natriuretic Peptide 186.0; BUN 30; Creatinine, Ser 1.86; Hemoglobin 10.9; Platelets 178; Potassium 3.3; Sodium 136     Component Value Date/Time   CHOL 227 (H) 07/29/2015 0841   TRIG 70 07/29/2015 0841   HDL 78 07/29/2015 0841   CHOLHDL 2.9 07/29/2015 0841   VLDL 14 07/29/2015 0841   LDLCALC 135 (H) 07/29/2015 0841    Other Studies Reviewed Today:  Echocardiogram 01/01/2016: Study Conclusions  - Left ventricle: The cavity size was normal. Wall thickness was   increased in a pattern of mild LVH. Systolic function was   severely reduced. The estimated ejection fraction was in the   range of 20% to 25%. Doppler parameters are consistent with   abnormal left ventricular relaxation (grade 1 diastolic   dysfunction). Doppler parameters are consistent with high    ventricular filling pressure. - Regional wall motion abnormality: Dyskinesis of the apical   inferior myocardium; akinesis of the mid anteroseptal, mid   inferoseptal, basal-mid inferior, basal-mid inferolateral, apical   septal, and apical myocardium; moderate hypokinesis of the entire   anterior and mid anterolateral myocardium. - Aortic valve: 23 mm Edwards Sapien 3 prosthetic aortic valve   noted (as per report). Appears to be functioning normally. There   was no regurgitation. Peak velocity (S): 297 cm/s. Mean gradient   (S): 18 mm Hg. - Mitral valve: Calcified annulus. Mildly thickened leaflets .   There was mild regurgitation. - Left atrium: The atrium was mildly dilated. - Tricuspid valve: There was mild regurgitation. - Pulmonary arteries: Systolic pressure was mildly increased. PA   peak pressure: 37 mm Hg (S). - Pericardium, extracardiac: A trivial pericardial effusion was   identified.  Assessment and Plan:  1. Nonischemic cardiomyopathy with LVEF 20-25% by follow-up echocardiogram in May. She continues on medical therapy as outlined above, has declined ICD. Overall clinically stable at this time.  2. Aortic stenosis status post TAVR by Dr. [name] in August 2016. She continues on aspirin and Plavix. Follow-up visit scheduled with Dr. [name] later this month.  3. CKD stage 3, creatinine 1.8 in May.  4. Essential hypertension, blood pressure is adequately controlled today.  Current medicines were reviewed with the patient today.  Disposition: Follow-up with me in 6 months.  Signed, [name], MD, [facility] 03/16/2016 2:27 PM    [facility] [address] [place], [place] [address] Phone: [phone]; Fax: [phone]

## 2016-03-16 ENCOUNTER — Ambulatory Visit (INDEPENDENT_AMBULATORY_CARE_PROVIDER_SITE_OTHER): Payer: PPO | Admitting: Cardiology

## 2016-03-16 ENCOUNTER — Encounter: Payer: Self-pay | Admitting: Cardiology

## 2016-03-16 VITALS — BP 128/80 | HR 81 | Ht 63.0 in | Wt 191.0 lb

## 2016-03-16 DIAGNOSIS — I10 Essential (primary) hypertension: Secondary | ICD-10-CM | POA: Diagnosis not present

## 2016-03-16 DIAGNOSIS — N183 Chronic kidney disease, stage 3 unspecified: Secondary | ICD-10-CM

## 2016-03-16 DIAGNOSIS — I429 Cardiomyopathy, unspecified: Secondary | ICD-10-CM

## 2016-03-16 DIAGNOSIS — I428 Other cardiomyopathies: Secondary | ICD-10-CM

## 2016-03-16 DIAGNOSIS — Z954 Presence of other heart-valve replacement: Secondary | ICD-10-CM

## 2016-03-16 DIAGNOSIS — Z952 Presence of prosthetic heart valve: Secondary | ICD-10-CM

## 2016-03-16 NOTE — Patient Instructions (Signed)
Your physician wants you to follow-up in: 6 Months with Dr. McDowell.  You will receive a reminder letter in the mail two months in advance. If you don't receive a letter, please call our office to schedule the follow-up appointment.  Your physician recommends that you continue on your current medications as directed. Please refer to the Current Medication list given to you today.  If you need a refill on your cardiac medications before your next appointment, please call your pharmacy.  Thank you for choosing [facility]! '  

## 2016-03-22 ENCOUNTER — Telehealth: Payer: Self-pay | Admitting: Family Medicine

## 2016-03-22 DIAGNOSIS — E785 Hyperlipidemia, unspecified: Secondary | ICD-10-CM

## 2016-03-22 DIAGNOSIS — IMO0002 Reserved for concepts with insufficient information to code with codable children: Secondary | ICD-10-CM

## 2016-03-22 DIAGNOSIS — N184 Chronic kidney disease, stage 4 (severe): Secondary | ICD-10-CM

## 2016-03-22 DIAGNOSIS — E1365 Other specified diabetes mellitus with hyperglycemia: Secondary | ICD-10-CM

## 2016-03-22 DIAGNOSIS — I10 Essential (primary) hypertension: Secondary | ICD-10-CM

## 2016-03-22 DIAGNOSIS — E1322 Other specified diabetes mellitus with diabetic chronic kidney disease: Secondary | ICD-10-CM

## 2016-03-22 NOTE — Telephone Encounter (Signed)
Labs ordered.

## 2016-03-22 NOTE — Telephone Encounter (Signed)
Please put lab orders in to [facility] so [name] [name] can go have labs drawn before next visit.Marland KitchenMarland KitchenMarland KitchenFasting lipid, cmp and EGFr, HBA1c and CPK in 3.5 month

## 2016-03-23 ENCOUNTER — Encounter: Payer: Self-pay | Admitting: Cardiovascular Disease

## 2016-03-25 DIAGNOSIS — E1322 Other specified diabetes mellitus with diabetic chronic kidney disease: Secondary | ICD-10-CM | POA: Diagnosis not present

## 2016-03-25 DIAGNOSIS — N184 Chronic kidney disease, stage 4 (severe): Secondary | ICD-10-CM | POA: Diagnosis not present

## 2016-03-25 DIAGNOSIS — E785 Hyperlipidemia, unspecified: Secondary | ICD-10-CM | POA: Diagnosis not present

## 2016-03-25 DIAGNOSIS — E1365 Other specified diabetes mellitus with hyperglycemia: Secondary | ICD-10-CM | POA: Diagnosis not present

## 2016-03-26 LAB — LIPID PANEL
Cholesterol: 226 mg/dL — ABNORMAL HIGH (ref 125–200)
HDL: 57 mg/dL (ref >=46)
LDL Cholesterol: 149 mg/dL — ABNORMAL HIGH (ref <130)
Total CHOL/HDL Ratio: 4 Ratio (ref <=5.0)
Triglycerides: 101 mg/dL (ref <150)
VLDL: 20 mg/dL (ref <30)

## 2016-03-26 LAB — COMPLETE METABOLIC PANEL WITH GFR
ALT: 15 U/L (ref 6–29)
AST: 18 U/L (ref 10–35)
Albumin: 4.1 g/dL (ref 3.6–5.1)
Alkaline Phosphatase: 44 U/L (ref 33–130)
BUN: 28 mg/dL — ABNORMAL HIGH (ref 7–25)
CO2: 25 mmol/L (ref 20–31)
Calcium: 9.8 mg/dL (ref 8.6–10.4)
Chloride: 106 mmol/L (ref 98–110)
Creat: 1.75 mg/dL — ABNORMAL HIGH (ref 0.60–0.93)
GFR, Est African American: 34 mL/min — ABNORMAL LOW (ref >=60)
GFR, Est Non African American: 29 mL/min — ABNORMAL LOW (ref >=60)
Glucose, Bld: 217 mg/dL — ABNORMAL HIGH (ref 65–99)
Potassium: 4.5 mmol/L (ref 3.5–5.3)
Sodium: 140 mmol/L (ref 135–146)
Total Bilirubin: 0.4 mg/dL (ref 0.2–1.2)
Total Protein: 6.7 g/dL (ref 6.1–8.1)

## 2016-03-26 LAB — CK: Total CK: 208 U/L — ABNORMAL HIGH (ref 7–177)

## 2016-03-26 LAB — HEMOGLOBIN A1C
Hgb A1c MFr Bld: 9 % — ABNORMAL HIGH (ref <5.7)
Mean Plasma Glucose: 212 mg/dL

## 2016-03-29 ENCOUNTER — Encounter: Payer: Self-pay | Admitting: Family Medicine

## 2016-03-29 ENCOUNTER — Ambulatory Visit (INDEPENDENT_AMBULATORY_CARE_PROVIDER_SITE_OTHER): Payer: PPO | Admitting: Family Medicine

## 2016-03-29 VITALS — BP 118/80 | HR 73 | Resp 16 | Ht 63.0 in | Wt 191.8 lb

## 2016-03-29 DIAGNOSIS — E1322 Other specified diabetes mellitus with diabetic chronic kidney disease: Secondary | ICD-10-CM

## 2016-03-29 DIAGNOSIS — E1365 Other specified diabetes mellitus with hyperglycemia: Secondary | ICD-10-CM

## 2016-03-29 DIAGNOSIS — IMO0002 Reserved for concepts with insufficient information to code with codable children: Secondary | ICD-10-CM

## 2016-03-29 DIAGNOSIS — Z0000 Encounter for general adult medical examination without abnormal findings: Secondary | ICD-10-CM | POA: Diagnosis not present

## 2016-03-29 DIAGNOSIS — N184 Chronic kidney disease, stage 4 (severe): Secondary | ICD-10-CM

## 2016-03-29 DIAGNOSIS — I10 Essential (primary) hypertension: Secondary | ICD-10-CM

## 2016-03-29 DIAGNOSIS — E785 Hyperlipidemia, unspecified: Secondary | ICD-10-CM

## 2016-03-29 MED ORDER — GLIPIZIDE ER 5 MG PO TB24: 5.0000 mg | ORAL_TABLET | Freq: Every day | ORAL | 5 refills | Status: DC

## 2016-03-29 MED ORDER — NIACIN ER (ANTIHYPERLIPIDEMIC) 500 MG PO TBCR
500.0000 mg | EXTENDED_RELEASE_TABLET | Freq: Every day | ORAL | 5 refills | Status: DC
Start: 2016-03-29 — End: 2016-11-11

## 2016-03-29 MED ORDER — FENOFIBRATE 145 MG PO TABS: 145.0000 mg | ORAL_TABLET | Freq: Every day | ORAL | 5 refills | Status: DC

## 2016-03-29 NOTE — Assessment & Plan Note (Signed)

## 2016-03-29 NOTE — Assessment & Plan Note (Signed)
Uncontrolled, no med change Hyperlipidemia:Low fat diet discussed and encouraged.   Lipid Panel  Lab Results  Component Value Date   CHOL 226 (H) 03/25/2016   HDL 57 03/25/2016   LDLCALC 149 (H) 03/25/2016   TRIG 101 03/25/2016   CHOLHDL 4.0 03/25/2016     Updated lab needed at/ before next visit.

## 2016-03-29 NOTE — Patient Instructions (Addendum)
F/u in 3 month, CALL with blood sugar results, every Friday and discuss with Brandi.  No medication changes.today   Need to change eating, blood sugar and cholesterol are too high, changing eating will change this, and TAKE medication as prescribed.  Test and record blood sugar tHREE times daily, before breakfast, before lunch and at bedtime  Goal for fasting blood sugar ranges from 90 to 130 and at bedtime should be between 140 to 180.   You are referred to Dr Gershon Crane for eye exam in September  Call for podiatry to cut toenails when you get ready  Fasting lipid, cmp and eGFr and hBa1C in 3 months, 3 to 5 days before appt please

## 2016-03-29 NOTE — Progress Notes (Signed)
Preventive Screening-Counseling & Management   Patient present here today for a Medicare annual wellness visit.   Current Problems (verified)   Medications Prior to Visit Allergies (verified)   PAST HISTORY  Family History (updated)   Social History was married for 26 years, recent widow,  retired with 4 sons and 1 daughter deceased   Risk Factors  Current exercise habits: Walks a few days a week and works a lot around her house. Stays busy    Dietary issues discussed: heart healthy, encouraged to reduce fried foods and carbs in diet    Cardiac risk factors: CHF, type 2 DM, mother had heart disease  Depression Screen  (Note: if answer to either of the following is "Yes", a more complete depression screening is indicated)   Over the past two weeks, have you felt down, depressed or hopeless? Sometimes she gets down  Over the past two weeks, have you felt little interest or pleasure in doing things? No  Have you lost interest or pleasure in daily life? No  Do you often feel hopeless? No  Do you cry easily over simple problems? No   Activities of Daily Living  In your present state of health, do you have any difficulty performing the following activities?  Driving?: never drove  Managing money?: No Feeding yourself?:No Getting from bed to chair?:No Climbing a flight of stairs?: can climb only a few due to knee pain  Preparing food and eating?:No Bathing or showering?:No Getting dressed?:No Getting to the toilet?:No Using the toilet?:No Moving around from place to place?: has a cane to use if needed but rarely uses it   Fall Risk Assessment In the past year have you fallen or had a near fall?:No Are you currently taking any medications that make you dizzy?:No   Hearing Difficulties: No Do you often ask people to speak up or repeat themselves?:No Do you experience ringing or noises in your ears?:No Do you have difficulty understanding soft or whispered  voices?:No  Cognitive Testing  Alert? Yes Normal Appearance?Yes  Oriented to person? Yes Place? Yes  Time? Yes  Displays appropriate judgment?Yes  Can read the correct time from a watch face? yes Are you having problems remembering things?No  Advanced Directives have been discussed with the patient?Yes, has brochure at home, full code    List the Names of Other Physician/Practitioners you currently use:    Indicate any recent Medical Services you may have received from other than [facility] providers in the past year (date may be approximate).     Medicare Attestation  I have personally reviewed:  The patient's medical and social history  Their use of alcohol, tobacco or illicit drugs  Their current medications and supplements  The patient's functional ability including ADLs,fall risks, home safety risks, cognitive, and hearing and visual impairment  Diet and physical activities  Evidence for depression or mood disorders  The patient's weight, height, BMI, and visual acuity have been recorded in the chart. I have made referrals, counseling, and provided education to the patient based on review of the above and I have provided the patient with a written personalized care plan for preventive services.    Physical Exam BP 118/80   Pulse 73   Resp 16   Ht 5\' 3"  (1.6 m)   Wt 191 lb 12.8 oz (87 kg)   SpO2 97%   BMI 33.98 kg/m    Assessment & Plan:  Medicare annual wellness visit, subsequent Annual exam as documented. Counseling done  re healthy lifestyle involving commitment to 150 minutes exercise per week, heart healthy diet, and attaining healthy weight.The importance of adequate sleep also discussed. Regular seat belt use and home safety, is also discussed. Changes in health habits are decided on by the patient with goals and time frames  set for achieving them. Immunization and cancer screening needs are specifically addressed at this visit.   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   Lipid Panel  Lab Results  Component Value Date   CHOL 226 (H) 03/25/2016   HDL 57 03/25/2016   LDLCALC 149 (H) 03/25/2016   TRIG 101 03/25/2016   CHOLHDL 4.0 03/25/2016     Updated lab needed at/ before next visit.

## 2016-03-29 NOTE — Assessment & Plan Note (Addendum)
Deteriorated, refer to diabetic ed and weekly calls, 3 month f/u no med change at this time, but may need to do so

## 2016-03-30 ENCOUNTER — Other Ambulatory Visit (HOSPITAL_COMMUNITY)
Admission: RE | Admit: 2016-03-30 | Discharge: 2016-03-30 | Disposition: A | Discharge status: Home / Self Care | Payer: PPO | Source: Ambulatory Visit | Attending: Family Medicine | Admitting: Family Medicine

## 2016-03-30 DIAGNOSIS — N184 Chronic kidney disease, stage 4 (severe): Secondary | ICD-10-CM | POA: Diagnosis not present

## 2016-03-30 DIAGNOSIS — E1365 Other specified diabetes mellitus with hyperglycemia: Secondary | ICD-10-CM | POA: Diagnosis not present

## 2016-03-30 DIAGNOSIS — E1322 Other specified diabetes mellitus with diabetic chronic kidney disease: Secondary | ICD-10-CM | POA: Insufficient documentation

## 2016-03-31 LAB — MICROALBUMIN / CREATININE URINE RATIO
Creatinine, Urine: 28.7 mg/dL
Microalb Creat Ratio: <10.5 mg/g creat (ref 0.0–30.0)
Microalb, Ur: <3 ug/mL — ABNORMAL HIGH

## 2016-04-01 ENCOUNTER — Other Ambulatory Visit: Payer: Self-pay | Admitting: Family Medicine

## 2016-04-04 ENCOUNTER — Telehealth: Payer: Self-pay | Admitting: Family Medicine

## 2016-04-04 NOTE — Telephone Encounter (Signed)
Patient is calling asking for Brandi to give surgar readings, please advise?

## 2016-04-04 NOTE — Telephone Encounter (Signed)
Called patient, no answer 

## 2016-04-05 NOTE — Telephone Encounter (Signed)
Fasting- [id number]  Bedtime- [id number],[phone]  Any med changes?

## 2016-04-05 NOTE — Telephone Encounter (Signed)
Bedtime is low, but fasting is high. Pls find out what time is her bedtime reading in relation to any food Whatever her snack is it tis too much carb as fastings are high  Bedtime means has not had anything but water to eat or drink for at least 2 to 3 hrs Needs to be be 140 or  above, If it is in that range no  need to snack  If less than 140 take 2 peanut butter crackers only, with water  CALL back next week or sooner if needed  ?? pls ask  No med change, eating seems to be the issue

## 2016-04-07 ENCOUNTER — Ambulatory Visit (INDEPENDENT_AMBULATORY_CARE_PROVIDER_SITE_OTHER): Payer: PPO | Admitting: Cardiovascular Disease

## 2016-04-07 ENCOUNTER — Other Ambulatory Visit: Payer: Self-pay

## 2016-04-07 ENCOUNTER — Ambulatory Visit (HOSPITAL_COMMUNITY): Payer: PPO | Attending: Cardiology

## 2016-04-07 ENCOUNTER — Encounter (HOSPITAL_COMMUNITY): Payer: Self-pay | Admitting: *Deleted

## 2016-04-07 VITALS — BP 110/80 | HR 72 | Ht 66.0 in | Wt 186.8 lb

## 2016-04-07 DIAGNOSIS — I130 Hypertensive heart and chronic kidney disease with heart failure and stage 1 through stage 4 chronic kidney disease, or unspecified chronic kidney disease: Secondary | ICD-10-CM | POA: Diagnosis not present

## 2016-04-07 DIAGNOSIS — Z954 Presence of other heart-valve replacement: Secondary | ICD-10-CM | POA: Diagnosis not present

## 2016-04-07 DIAGNOSIS — I350 Nonrheumatic aortic (valve) stenosis: Secondary | ICD-10-CM | POA: Insufficient documentation

## 2016-04-07 DIAGNOSIS — E785 Hyperlipidemia, unspecified: Secondary | ICD-10-CM | POA: Diagnosis not present

## 2016-04-07 DIAGNOSIS — I77819 Aortic ectasia, unspecified site: Secondary | ICD-10-CM | POA: Insufficient documentation

## 2016-04-07 DIAGNOSIS — E1122 Type 2 diabetes mellitus with diabetic chronic kidney disease: Secondary | ICD-10-CM | POA: Diagnosis not present

## 2016-04-07 DIAGNOSIS — N189 Chronic kidney disease, unspecified: Secondary | ICD-10-CM | POA: Insufficient documentation

## 2016-04-07 DIAGNOSIS — Z953 Presence of xenogenic heart valve: Secondary | ICD-10-CM | POA: Diagnosis not present

## 2016-04-07 DIAGNOSIS — Z8249 Family history of ischemic heart disease and other diseases of the circulatory system: Secondary | ICD-10-CM | POA: Insufficient documentation

## 2016-04-07 DIAGNOSIS — I428 Other cardiomyopathies: Secondary | ICD-10-CM | POA: Insufficient documentation

## 2016-04-07 DIAGNOSIS — I340 Nonrheumatic mitral (valve) insufficiency: Secondary | ICD-10-CM | POA: Insufficient documentation

## 2016-04-07 DIAGNOSIS — Z952 Presence of prosthetic heart valve: Secondary | ICD-10-CM

## 2016-04-07 DIAGNOSIS — I509 Heart failure, unspecified: Secondary | ICD-10-CM | POA: Diagnosis not present

## 2016-04-07 NOTE — Progress Notes (Unsigned)
Echo Done 01-01-16, spoke with Dr. [name] to confirm need for echo today.  He states that due to medicare requirement for 1 year follow up echo we should proceed.  Patient understands and is willing to have exam done today.  EF is low, as it has appeared on prior exams, and TAVR valve appears well seated with no regurgitation. She does not complain of any symptoms, and appears stable during and after exam.  She is released for her appointment with Dr. [name].

## 2016-04-07 NOTE — Progress Notes (Signed)
Chief Complaint  Patient presents with  . Aortic Stenosis    severe    History of Present Illness: 70 yo female with history of NICM, HTN, HLD, severe aortic valve stenosis now s/p TAVR on 03/24/15. She is here today for one year TAVR follow up. Her TAVR procedure was performed on 03/24/15. She had placement of a 23 mm Edwards Sapien 3 valve from the right femoral artery approach. She did well following the procedure.   She is here today for follow up. She has been doing well. She has no chest pain or dyspnea with normal exertion. She is active and she is doing all of her own household chores. Mild dyspnea with moderate exertion.   Primary Care Physician: [name], MD   Past Medical History:  Diagnosis Date  . Anxiety   . Aortic stenosis   . Arthritis   . Chronic systolic heart failure ([place])   . CKD (chronic kidney disease) stage 3, GFR 30-59 ml/min   . Constipation   . Depression   . Diabetes mellitus, type 2 ([place])   . Essential hypertension, benign   . Hyperlipidemia   . Incidental pulmonary nodule, > 20mm and < 72mm    7 x 4 mm LLL nodule  . LBBB (left bundle branch block)   . Nonischemic cardiomyopathy (HCC)    No significant obstructive CAD at heart catheterization 05/2014, LVEF 20%  . Obesity   . Pneumonia 2013  . 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 Symptomatic PVCs     Past Surgical History:  Procedure Laterality Date  . CARDIAC CATHETERIZATION    . CESAREAN SECTION  1987  . FLEXIBLE BRONCHOSCOPY N/A 01/14/2015   Procedure: FLEXIBLE BRONCHOSCOPY;  Surgeon: [name], MD;  Location: [facility];  Service: Thoracic;  Laterality: N/A;  . LEFT AND RIGHT HEART CATHETERIZATION WITH CORONARY ANGIOGRAM N/A 12/05/2011   Procedure: LEFT AND RIGHT HEART CATHETERIZATION WITH CORONARY ANGIOGRAM;  Surgeon: [name], MD;  Location: [facility]  CATH LAB;  Service: Cardiovascular;  Laterality: N/A;  . LEFT AND RIGHT HEART CATHETERIZATION WITH CORONARY ANGIOGRAM N/A 05/23/2014   Procedure: LEFT AND RIGHT HEART CATHETERIZATION WITH CORONARY ANGIOGRAM;  Surgeon: [name], MD;  Location: [facility] CATH LAB;  Service: Cardiovascular;  Laterality: N/A;  . MULTIPLE EXTRACTIONS WITH ALVEOLOPLASTY N/A 02/27/2014   Procedure: Extraction of tooth #'s 2,4,5,6,7,8,9,10,11,12, 22, 23, 24, 25, 26, 28 with alveoloplasty and bilateral mandibular tori reductions;  Surgeon: [name], DDS;  Location: [facility];  Service: Oral Surgery;  Laterality: N/A;  . RIGID BRONCHOSCOPY N/A 01/14/2015   Procedure: RIGID BRONCHOSCOPY with Removal Of Foreign Body ;  Surgeon: [name], MD;  Location: [facility];  Service: Thoracic;  Laterality: N/A;  . TEE WITHOUT CARDIOVERSION N/A 03/24/2015   Procedure: TRANSESOPHAGEAL ECHOCARDIOGRAM (TEE);  Surgeon: [name], MD;  Location: [facility];  Service: Open Heart Surgery;  Laterality: N/A;  . TRANSCATHETER AORTIC VALVE REPLACEMENT, TRANSFEMORAL Bilateral 03/24/2015   Procedure: TRANSCATHETER AORTIC VALVE REPLACEMENT, TRANSFEMORAL;  Surgeon: [name], MD;  Location: [place];  Service: Open Heart Surgery;  Laterality: Bilateral;  . TUBAL LIGATION  1987  . VIDEO BRONCHOSCOPY Bilateral 06/05/2014   Procedure: VIDEO BRONCHOSCOPY WITHOUT FLUORO;  Surgeon: [name], MD;  Location: [facility] ENDOSCOPY;  Service: Cardiopulmonary;  Laterality: Bilateral;    Current Outpatient Prescriptions  Medication Sig  Dispense Refill  . acetaminophen (TYLENOL) 500 MG tablet Take 1,000 mg by mouth every 6 (six) hours as needed for moderate pain.    Marland Kitchen aspirin EC 81 MG tablet Take 81 mg by mouth daily.    Marland Kitchen azelastine (ASTELIN) 0.1 % nasal spray Place 1 spray into both nostrils 2 (two) times daily. Use in each nostril as directed 30 mL 6  . carvedilol (COREG) 25 MG tablet TAKE 1 TABLET BY MOUTH TWICE DAILY WITH MEALS.  60 tablet 6  . clopidogrel (PLAVIX) 75 MG tablet Take 1 tablet (75 mg total) by mouth daily with breakfast. 30 tablet 6  . EASYMAX TEST test strip Check blood sugar three times daily dx E10.9 100 each 5  . ezetimibe (ZETIA) 10 MG tablet TAKE ONE TABLET BY MOUTH ONCE DAILY. 30 tablet 3  . fenofibrate (TRICOR) 145 MG tablet Take 1 tablet (145 mg total) by mouth daily. 30 tablet 5  . furosemide (LASIX) 40 MG tablet TAKE 1 TABLET BY MOUTH EVERY MORNING. 30 tablet 4  . glipiZIDE (GLIPIZIDE XL) 5 MG 24 hr tablet Take 1 tablet (5 mg total) by mouth daily with breakfast. 30 tablet 5  . insulin aspart protamine - aspart (NOVOLOG MIX 70/30 FLEXPEN) (70-30) 100 UNIT/ML FlexPen Inject 0.12 mLs (12 Units total) into the skin 2 (two) times daily. 15 mL 11  . Multiple Vitamins-Minerals (MULTIVITAMIN PO) Take 1 tablet by mouth daily.    . niacin (NIASPAN) 500 MG CR tablet Take 1 tablet (500 mg total) by mouth at bedtime. 30 tablet 5  . spironolactone (ALDACTONE) 25 MG tablet Take 1 tablet (25 mg total) by mouth daily. 90 tablet 3  . UNIFINE PENTIPS 31G X 8 MM MISC USE AS NEEDED TO INJECT INSULIN. 100 each 5   No current facility-administered medications for this visit.     Allergies  Allergen Reactions  . Crestor [Rosuvastatin] Other (See Comments)    Muscle aches and elevated CK  . Penicillins Other (See Comments)    Family unsure of reaction, but certain mom said she was allergic    Social History   Social History  . Marital status: Married    Spouse name: N/A  . Number of children: 5  . Years of education: N/A   Occupational History  . Retired    Social History Main Topics  . Smoking status: Never Smoker  . Smokeless tobacco: Never Used  . Alcohol use No  . Drug use: No  . Sexual activity: No   Other Topics Concern  . Not on file   Social History Narrative  . No narrative on file    Family History  Problem Relation Age of Onset  . Heart disease Mother   . Heart attack Mother 68   . Diabetes Mother   . Hypertension Mother   . Colon cancer Father 43  . Breast cancer Sister   . Diabetes Brother   . Hypertension Brother   . Stroke Brother   . Cancer Brother     Review of Systems:  As stated in the HPI and otherwise negative.   BP 110/80   Pulse 72   Ht 5\' 6"  (1.676 m)   Wt 186 lb 12.8 oz (84.7 kg)   SpO2 93%   BMI 30.15 kg/m   Physical Examination: General: Well developed, well nourished, NAD  HEENT: OP clear, mucus membranes moist  SKIN: warm, dry. No rashes. Neuro: No focal deficits  Musculoskeletal: Muscle strength 5/5 all ext  Psychiatric: Mood and affect normal  Neck: No JVD, no carotid bruits, no thyromegaly, no lymphadenopathy.  Lungs:Clear bilaterally, no wheezes, rhonci, crackles Cardiovascular: Regular rate and rhythm. Systolic murmur noted. No gallops or rubs. Abdomen:Soft. Bowel sounds present. Non-tender.  Extremities: No lower extremity edema. Pulses are 2 + in the bilateral DP/PT.  Echo 04/07/16:  Left ventricle: The cavity size was mildly dilated. Wall   thickness was increased in a pattern of mild LVH. The estimated   ejection fraction was 25%. Diffuse hypokinesis. Septal-lateral   dyssynchrony. Doppler parameters are consistent with abnormal   left ventricular relaxation (grade 1 diastolic dysfunction). - Aortic valve: There was a bioprosthetic aortic valve s/p   transcatheter aortic valve replacement. The valve was   well-seated. There was no regurgitation. Mean gradient (S): 15 mm   Hg. - Aorta: Mildly dilated ascending aorta. Ascending aortic diameter:   37 mm (S). - Mitral valve: Mildly calcified annulus. There was mild   regurgitation. - Left atrium: The atrium was at the upper limits of normal in   size. - Right ventricle: The cavity size was normal. Systolic function   was normal. - Tricuspid valve: Peak RV-RA gradient (S): 24 mm Hg. - Pulmonary arteries: PA peak pressure: 27 mm Hg (S). - Inferior vena cava: The  vessel was normal in size. The   respirophasic diameter changes were in the normal range (= 50%),   consistent with normal central venous pressure.  Impressions:  - Mildly dilated LV with mild LV hypertrophy. EF 25% with diffuse   hypokinesis and septal-lateral dyssynchrony. Normal RV size and   systolic function. Bioprosthetic aortic valve s/p TAVR appeared   to function normally.   EKG:  EKG is not ordered today. The ekg ordered today demonstrates   Recent Labs: 06/11/2015: Magnesium 2.1 10/07/2015: TSH 3.88 01/01/2016: B Natriuretic Peptide 186.0; Hemoglobin 10.9; Platelets 178 03/25/2016: ALT 15; BUN 28; Creat 1.75; Potassium 4.5; Sodium 140   Lipid Panel    Component Value Date/Time   CHOL 226 (H) 03/25/2016 1042   TRIG 101 03/25/2016 1042   HDL 57 03/25/2016 1042   CHOLHDL 4.0 03/25/2016 1042   VLDL 20 03/25/2016 1042   LDLCALC 149 (H) 03/25/2016 1042     Wt Readings from Last 3 Encounters:  04/07/16 186 lb 12.8 oz (84.7 kg)  03/29/16 191 lb 12.8 oz (87 kg)  03/16/16 191 lb (86.6 kg)     Other studies Reviewed: Additional studies/ records that were reviewed today include: . Review of the above records demonstrates:    Assessment and Plan:   1. Severe aortic valve stenosis: She is doing well one year post TAVR. She is on ASA and Plavix. Echo today shows normally functioning bioprosthetic valve with mean gradient 15 mm Hg across bioprosthetic valve. She is NYHA Class II. I will stop her Plavix and continue ASA daily. She will f/u with Dr. [name] for her cardiac care.    Current medicines are reviewed at length with the patient today.  The patient does not have concerns regarding medicines.  The following changes have been made:  no change  Labs/ tests ordered today include:  No orders of the defined types were placed in this encounter.   Disposition:   FU with Dr. [name] in 6 months.   Signed, Lauree Chandler, MD 04/07/2016 3:37 PM    [facility] [address], [place], [place]  [address] Phone: [phone]; Fax: [phone]

## 2016-04-07 NOTE — Patient Instructions (Signed)
Medication Instructions:  Your physician recommends that you continue on your current medications as directed. Please refer to the Current Medication list given to you today.  Labwork: none  Testing/Procedures: none  Follow-Up: Your physician wants you to follow-up in: 6 months with Dr. McDowell You will receive a reminder letter in the mail two months in advance. If you don't receive a letter, please call our office to schedule the follow-up appointment.  Any Other Special Instructions Will Be Listed Below (If Applicable).  If you need a refill on your cardiac medications before your next appointment, please call your pharmacy. 

## 2016-04-08 NOTE — Telephone Encounter (Signed)
Called pt no answer °

## 2016-04-21 ENCOUNTER — Ambulatory Visit: Payer: Self-pay | Admitting: Nutrition

## 2016-04-21 ENCOUNTER — Other Ambulatory Visit: Payer: Self-pay

## 2016-04-21 MED ORDER — EASYMAX TEST VI STRP: ORAL_STRIP | 5 refills | Status: DC

## 2016-04-21 MED ORDER — LANCETS MISC: 5 refills | Status: DC

## 2016-04-22 NOTE — Telephone Encounter (Signed)
First week of Sept- FASTING- [phone] LUNCH- 127,119,119,145,137,93 BEDTIME- 187,121,139,174,93  Sugars are improved and out of the 200 range. Patient continuing to work on her diet and improving her a1c.

## 2016-05-02 ENCOUNTER — Other Ambulatory Visit: Payer: Self-pay | Admitting: Family Medicine

## 2016-05-13 ENCOUNTER — Other Ambulatory Visit: Payer: Self-pay | Admitting: Family Medicine

## 2016-05-13 ENCOUNTER — Other Ambulatory Visit: Payer: Self-pay | Admitting: Adult Health

## 2016-05-17 ENCOUNTER — Ambulatory Visit: Payer: Self-pay | Admitting: Orthopedic Surgery

## 2016-05-17 ENCOUNTER — Emergency Department (HOSPITAL_COMMUNITY)
Admission: EM | Admit: 2016-05-17 | Discharge: 2016-05-17 | Disposition: A | Discharge status: Home / Self Care | Payer: PPO | Attending: Emergency Medicine | Admitting: Emergency Medicine

## 2016-05-17 ENCOUNTER — Encounter (HOSPITAL_COMMUNITY): Payer: Self-pay | Admitting: Emergency Medicine

## 2016-05-17 DIAGNOSIS — I130 Hypertensive heart and chronic kidney disease with heart failure and stage 1 through stage 4 chronic kidney disease, or unspecified chronic kidney disease: Secondary | ICD-10-CM | POA: Diagnosis not present

## 2016-05-17 DIAGNOSIS — Z7982 Long term (current) use of aspirin: Secondary | ICD-10-CM | POA: Insufficient documentation

## 2016-05-17 DIAGNOSIS — E1122 Type 2 diabetes mellitus with diabetic chronic kidney disease: Secondary | ICD-10-CM | POA: Insufficient documentation

## 2016-05-17 DIAGNOSIS — I5042 Chronic combined systolic (congestive) and diastolic (congestive) heart failure: Secondary | ICD-10-CM | POA: Diagnosis not present

## 2016-05-17 DIAGNOSIS — M25562 Pain in left knee: Secondary | ICD-10-CM | POA: Diagnosis not present

## 2016-05-17 DIAGNOSIS — Z7984 Long term (current) use of oral hypoglycemic drugs: Secondary | ICD-10-CM | POA: Insufficient documentation

## 2016-05-17 DIAGNOSIS — Z794 Long term (current) use of insulin: Secondary | ICD-10-CM | POA: Diagnosis not present

## 2016-05-17 DIAGNOSIS — Z79899 Other long term (current) drug therapy: Secondary | ICD-10-CM | POA: Diagnosis not present

## 2016-05-17 DIAGNOSIS — N184 Chronic kidney disease, stage 4 (severe): Secondary | ICD-10-CM | POA: Insufficient documentation

## 2016-05-17 MED ORDER — ACETAMINOPHEN 500 MG PO TABS
1000.0000 mg | ORAL_TABLET | Freq: Once | ORAL | Status: AC
  Administered 2016-05-17: 1000 mg via ORAL
  Filled 2016-05-17: qty 2

## 2016-05-17 MED ORDER — TRAMADOL HCL 50 MG PO TABS
50.0000 mg | ORAL_TABLET | Freq: Once | ORAL | Status: AC
  Administered 2016-05-17: 50 mg via ORAL
  Filled 2016-05-17: qty 1

## 2016-05-17 MED ORDER — DICLOFENAC SODIUM 1 % TD GEL: 4.0000 g | Freq: Four times a day (QID) | TRANSDERMAL | 1 refills | Status: DC

## 2016-05-17 MED ORDER — TRAMADOL HCL 50 MG PO TABS: 50.0000 mg | ORAL_TABLET | Freq: Two times a day (BID) | ORAL | 0 refills | Status: DC | PRN

## 2016-05-17 NOTE — Discharge Instructions (Signed)
Use Voltaren (diclofenac) gel up to 4 times a day on your left knee You may continue to use Tylenol as needed for your pain If your pain is still not controlled by the Voltaren or Tylenol, you may take 1 tablet of the tramadol up to every 12 hours as needed. Follow up with your surgeon as scheduled Follow up with your primary care within a week

## 2016-05-17 NOTE — ED Provider Notes (Signed)
[facility] DEPT Provider Note   CSN: [id number] Arrival date & time: 05/17/16  0904     History   Chief Complaint Chief Complaint  Patient presents with  . Knee Pain    HPI CLARICE ZULAUF is a 70 year old woman with obesity, CKD stage 3, HTN, NICM, Chronic combined CHF, severe aortic valve stenosis s/p TAVR in 2016, DM2, depression.  HPI  She presents with acute on chronic left knee pain. The pain worsened last night. It is an aching pain. Located most in the posterior aspect of her knee and radiates down foot. She has some popping sensations while walking. Denies locking. She has pain with walking at baseline. She does not use any assistive devices for ambulation. She started using a cane. She took extra strength Tylenol for her pain with minimal improvement in pain. No erythema, warmth or swelling of knee. Denies trauma to her knee.  Knee XR 06/04/2015 with severe arthritis of the medial compartment with varus alignment of left knee. She has an appointment with Dr. Aline Brochure 11/7.   Past Medical History:  Diagnosis Date  . Anxiety   . Aortic stenosis   . Arthritis   . Chronic systolic heart failure ([place])   . CKD (chronic kidney disease) stage 3, GFR 30-59 ml/min   . Constipation   . Depression   . Diabetes mellitus, type 2 ([place])   . Essential hypertension, benign   . Hyperlipidemia   . Incidental pulmonary nodule, > 3mm and < 96mm    7 x 4 mm LLL nodule  . LBBB (left bundle branch block)   . Nonischemic cardiomyopathy (H[facility])    No significant obstructive CAD at heart catheterization 05/2014, LVEF 20%  . Obesity   . Pneumonia 2013  . 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 Symptomatic PVCs     Patient Active Problem List   Diagnosis Date Noted  . Chest pain 01/01/2016  . Pain in the chest   . Depression 08/23/2015  . S/P TAVR (transcatheter aortic  valve replacement) 03/24/2015  . Severe aortic valve stenosis 03/24/2015  . Knee pain, left 12/16/2014  . Hyperlipidemia LDL goal <100 05/24/2014  . Severe aortic stenosis 05/23/2014  . Medicare annual wellness visit, subsequent 04/27/2014  . Incidental pulmonary nodule, > 23mm and < 70mm 02/10/2014  . Chronic combined systolic and diastolic CHF (congestive heart failure) ([place])   . LBBB (left bundle branch block) 12/03/2013  . CKD (chronic kidney disease) stage 3, GFR 30-59 ml/min 05/21/2013  . Chronic systolic heart failure ([place]) 04/17/2012  . Seasonal allergies 02/22/2012  . Essential hypertension, benign   . Aortic stenosis   . Nonischemic cardiomyopathy ([place]) 10/14/2011  . Insomnia 03/22/2011  . Uncontrolled secondary diabetes mellitus with stage 4 CKD (GFR 15-29) ([place]) 10/24/2007  . Obesity 10/24/2007    Past Surgical History:  Procedure Laterality Date  . CARDIAC CATHETERIZATION    . CESAREAN SECTION  1987  . FLEXIBLE BRONCHOSCOPY N/A 01/14/2015   Procedure: FLEXIBLE BRONCHOSCOPY;  Surgeon: [name], MD;  Location: [facility];  Service: Thoracic;  Laterality: N/A;  . LEFT AND RIGHT HEART CATHETERIZATION WITH CORONARY ANGIOGRAM N/A 12/05/2011   Procedure: LEFT AND RIGHT HEART CATHETERIZATION WITH CORONARY ANGIOGRAM;  Surgeon: [name], MD;  Location: [facility] CATH LAB;  Service: Cardiovascular;  Laterality: N/A;  . LEFT AND RIGHT HEART CATHETERIZATION WITH CORONARY ANGIOGRAM N/A 05/23/2014  Procedure: LEFT AND RIGHT HEART CATHETERIZATION WITH CORONARY ANGIOGRAM;  Surgeon: [name], MD;  Location: [facility] CATH LAB;  Service: Cardiovascular;  Laterality: N/A;  . MULTIPLE EXTRACTIONS WITH ALVEOLOPLASTY N/A 02/27/2014   Procedure: Extraction of tooth #'s 2,4,5,6,7,8,9,10,11,12, 22, 23, 24, 25, 26, 28 with alveoloplasty and bilateral mandibular tori reductions;  Surgeon: [name], DDS;  Location: [facility];  Service: Oral Surgery;  Laterality: N/A;  . RIGID  BRONCHOSCOPY N/A 01/14/2015   Procedure: RIGID BRONCHOSCOPY with Removal Of Foreign Body ;  Surgeon: [name], MD;  Location: [place];  Service: Thoracic;  Laterality: N/A;  . TEE WITHOUT CARDIOVERSION N/A 03/24/2015   Procedure: TRANSESOPHAGEAL ECHOCARDIOGRAM (TEE);  Surgeon: [name], MD;  Location: [facility];  Service: Open Heart Surgery;  Laterality: N/A;  . TRANSCATHETER AORTIC VALVE REPLACEMENT, TRANSFEMORAL Bilateral 03/24/2015   Procedure: TRANSCATHETER AORTIC VALVE REPLACEMENT, TRANSFEMORAL;  Surgeon: [name], MD;  Location: [facility];  Service: Open Heart Surgery;  Laterality: Bilateral;  . TUBAL LIGATION  1987  . VIDEO BRONCHOSCOPY Bilateral 06/05/2014   Procedure: VIDEO BRONCHOSCOPY WITHOUT FLUORO;  Surgeon: [name], MD;  Location: [facility] ENDOSCOPY;  Service: Cardiopulmonary;  Laterality: Bilateral;    Home Medications    Prior to Admission medications   Medication Sig Start Date End Date Taking? Authorizing Provider  acetaminophen (TYLENOL) 500 MG tablet Take 1,000 mg by mouth every 6 (six) hours as needed for moderate pain.   Yes Historical Provider, MD  aspirin EC 81 MG tablet Take 81 mg by mouth daily.   Yes Historical Provider, MD  carvedilol (COREG) 25 MG tablet TAKE 1 TABLET BY MOUTH TWICE DAILY WITH MEALS. 05/13/16  Yes [name], MD  [facility] TEST test strip Check blood sugar three times daily dx E10.65 04/21/16  Yes Fayrene Helper, MD  ezetimibe (ZETIA) 10 MG tablet TAKE ONE TABLET BY MOUTH ONCE DAILY. 05/16/16  Yes Fayrene Helper, MD  fenofibrate (TRICOR) 145 MG tablet Take 1 tablet (145 mg total) by mouth daily. 03/29/16  Yes Fayrene Helper, MD  furosemide (LASIX) 40 MG tablet TAKE 1 TABLET BY MOUTH EVERY MORNING. 02/02/16  Yes Fayrene Helper, MD  glipiZIDE (GLIPIZIDE XL) 5 MG 24 hr tablet Take 1 tablet (5 mg total) by mouth daily with breakfast. 03/29/16  Yes Fayrene Helper, MD  insulin aspart protamine - aspart  (NOVOLOG MIX 70/30 FLEXPEN) (70-30) 100 UNIT/ML FlexPen Inject 0.12 mLs (12 Units total) into the skin 2 (two) times daily. 12/22/15  Yes Fayrene Helper, MD  Lancets MISC Three times daily testing dx   e10..65 for easymax meter 04/21/16  Yes Fayrene Helper, MD  Multiple Vitamins-Minerals (MULTIVITAMIN PO) Take 1 tablet by mouth daily.   Yes Historical Provider, MD  niacin (NIASPAN) 500 MG CR tablet Take 1 tablet (500 mg total) by mouth at bedtime. 03/29/16  Yes Fayrene Helper, MD  potassium chloride (K-DUR) 10 MEQ tablet TAKE ONE TABLET BY MOUTH DAILY. DO NOT TAKE IF NOT TAKING FUROSEMIDE. 05/02/16  Yes Fayrene Helper, MD  spironolactone (ALDACTONE) 25 MG tablet Take 1 tablet (25 mg total) by mouth daily. 08/18/15  Yes Fayrene Helper, MD  UNIFINE PENTIPS 31G X 8 MM MISC USE AS NEEDED TO INJECT INSULIN. 05/22/15  Yes Fayrene Helper, MD  diclofenac sodium (VOLTAREN) 1 % GEL Apply 4 g topically 4 (four) times daily. 05/17/16   Milagros Loll, MD  traMADol (ULTRAM) 50 MG tablet  Take 1 tablet (50 mg total) by mouth every 12 (twelve) hours as needed for severe pain. 05/17/16   Milagros Loll, MD    Family History Family History  Problem Relation Age of Onset  . Heart disease Mother   . Heart attack Mother 62  . Diabetes Mother   . Hypertension Mother   . Colon cancer Father 50  . Breast cancer Sister   . Diabetes Brother   . Hypertension Brother   . Stroke Brother   . Cancer Brother     Social History Social History  Substance Use Topics  . Smoking status: Never Smoker  . Smokeless tobacco: Never Used  . Alcohol use No     Allergies   Crestor [rosuvastatin] and Penicillins   Review of Systems Review of Systems Constitutional: no fevers/chills Eyes: no vision changes Ears, nose, mouth, throat, and face: no cough Respiratory: no shortness of breath Cardiovascular: no chest pain Gastrointestinal: no nausea/vomiting, no abdominal pain, no constipation, no  diarrhea Genitourinary: no dysuria, no hematuria Integument: no rash Hematologic/lymphatic: no bleeding/bruising, no edema Musculoskeletal: +arthralgias, no myalgias Neurological: no paresthesias, no weakness  Physical Exam Updated Vital Signs BP 118/61   Pulse 68   Temp 98.1 F (36.7 C) (Oral)   Resp 18   Ht 5\' 5"  (1.651 m)   Wt 85.3 kg   SpO2 98%   BMI 31.28 kg/m   Physical Exam General Apperance: NAD Head: Normocephalic, atraumatic Eyes: PERRL, EOMI, anicteric sclera Ears: Normal external ear canal Nose: Nares normal, septum midline, mucosa normal Throat: Lips, mucosa and tongue normal  Neck: Supple, trachea midline Back: No tenderness or bony abnormality  Lungs: Clear to auscultation bilaterally. No wheezes, rhonchi or rales. Breathing comfortably Chest Wall: Nontender, no deformity Heart: Regular rate and rhythm, no murmur/rub/gallop Abdomen: Soft, nontender, nondistended, no rebound/guarding Extremities: Normal, atraumatic, warm and well perfused, no edema. L knee tender to palpation without erythema or effusion  Pulses: 2+ throughout Skin: No rashes or lesions Neurologic: Alert and oriented x 3. CNII-XII intact. Normal strength and sensation   ED Treatments / Results   Procedures Procedures  None  Medications Ordered in ED Medications  traMADol (ULTRAM) tablet 50 mg (50 mg Oral Given 05/17/16 0952)  acetaminophen (TYLENOL) tablet 1,000 mg (1,000 mg Oral Given 05/17/16 1032)     Initial Impression / Assessment and Plan / ED Course  I have reviewed the triage vital signs and the nursing notes.  Pertinent labs & imaging results that were available during my care of the patient were reviewed by me and considered in my medical decision making (see chart for details).  Clinical Course  9:50AM - Tramadol ordered 10:40AM - Pain improved. Ordered Tylenol.  Final Clinical Impressions(s) / ED Diagnoses   Final diagnoses:  Acute pain of left knee  Acute  exacerbation of chronic pain from osteoarthritis of the left knee. No signs or symptoms concerning for septic arthritis. Neurovascularly intact. Will prescribe Voltaren gel QID. She can use Tylenol over the counter. Tramadol up to every 12 hours as needed for breakthrough pain. Follow up with orthopedics as previously scheduled. Follow up with PCP in 1 week.  New Prescriptions New Prescriptions   DICLOFENAC SODIUM (VOLTAREN) 1 % GEL    Apply 4 g topically 4 (four) times daily.   TRAMADOL (ULTRAM) 50 MG TABLET    Take 1 tablet (50 mg total) by mouth every 12 (twelve) hours as needed for severe pain.     Milagros Loll,  MD 05/17/16 [place], MD 05/17/16 1524

## 2016-05-17 NOTE — ED Triage Notes (Signed)
Reports chronic left knee pain worsening last night.  States she is followed by Dr. Aline Brochure who told her she needed a knee replacement.

## 2016-05-20 ENCOUNTER — Telehealth: Payer: Self-pay | Admitting: Family Medicine

## 2016-05-20 NOTE — Telephone Encounter (Signed)
Brandi, Please call Lidya back so she can give you her blood sugar readings

## 2016-05-27 ENCOUNTER — Other Ambulatory Visit: Payer: Self-pay

## 2016-05-27 MED ORDER — FUROSEMIDE 40 MG PO TABS: 40.0000 mg | ORAL_TABLET | Freq: Every morning | ORAL | 4 refills | Status: DC

## 2016-05-31 DIAGNOSIS — H2513 Age-related nuclear cataract, bilateral: Secondary | ICD-10-CM | POA: Diagnosis not present

## 2016-05-31 DIAGNOSIS — E119 Type 2 diabetes mellitus without complications: Secondary | ICD-10-CM | POA: Diagnosis not present

## 2016-05-31 LAB — HM DIABETES EYE EXAM

## 2016-06-02 ENCOUNTER — Other Ambulatory Visit: Payer: Self-pay | Admitting: Family Medicine

## 2016-06-13 ENCOUNTER — Other Ambulatory Visit: Payer: Self-pay | Admitting: Family Medicine

## 2016-06-21 ENCOUNTER — Ambulatory Visit (INDEPENDENT_AMBULATORY_CARE_PROVIDER_SITE_OTHER): Payer: PPO | Admitting: Orthopedic Surgery

## 2016-06-21 ENCOUNTER — Encounter: Payer: Self-pay | Admitting: Orthopedic Surgery

## 2016-06-21 ENCOUNTER — Ambulatory Visit (INDEPENDENT_AMBULATORY_CARE_PROVIDER_SITE_OTHER): Payer: PPO

## 2016-06-21 DIAGNOSIS — M25562 Pain in left knee: Secondary | ICD-10-CM

## 2016-06-21 DIAGNOSIS — G8929 Other chronic pain: Secondary | ICD-10-CM

## 2016-06-21 DIAGNOSIS — M1712 Unilateral primary osteoarthritis, left knee: Secondary | ICD-10-CM | POA: Diagnosis not present

## 2016-06-21 NOTE — Progress Notes (Signed)
Patient ID: [name] [name], female   DOB: 23-Nov-1945, 70 y.o.   MRN: [id number]  Chief Complaint  Patient presents with  . Follow-up    FOLLOW UP LEFT KNEE    HPI [name] [name] is a 70 y.o. female.  Presents for reevaluation of ongoing osteoarthritis left knee, status post valve replacement. Last time gave her a cortisone injection and recommended a six-month follow-up  Currently she complains of mild to moderate pain left knee, she did have one episode which center to the ER no acute process was going on at that time. She has a dull throbbing aching pain in the anteromedial aspect of the left knee joint which is now of over years duration it seems to be worse with activity improved with rest and is associated with occasional joint swelling  Review of Systems Review of Systems  Constitutional: Negative for fever.  Respiratory: Negative for shortness of breath.   Cardiovascular: Negative for chest pain.   Past Medical History:  Diagnosis Date  . Anxiety   . Aortic stenosis   . Arthritis   . Chronic systolic heart failure ([place])   . CKD (chronic kidney disease) stage 3, GFR 30-59 ml/min   . Constipation   . Depression   . Diabetes mellitus, type 2 ([place])   . Essential hypertension, benign   . Hyperlipidemia   . Incidental pulmonary nodule, > 50mm and < 79mm    7 x 4 mm LLL nodule  . LBBB (left bundle branch block)   . Nonischemic cardiomyopathy (H[facility])    No significant obstructive CAD at heart catheterization 05/2014, LVEF 20%  . Obesity   . Pneumonia 2013  . 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 Symptomatic PVCs     Past Surgical History:  Procedure Laterality Date  . CARDIAC CATHETERIZATION    . CESAREAN SECTION  1987  . FLEXIBLE BRONCHOSCOPY N/A 01/14/2015   Procedure: FLEXIBLE BRONCHOSCOPY;  Surgeon: [name], MD;  Location: [facility];  Service:  Thoracic;  Laterality: N/A;  . LEFT AND RIGHT HEART CATHETERIZATION WITH CORONARY ANGIOGRAM N/A 12/05/2011   Procedure: LEFT AND RIGHT HEART CATHETERIZATION WITH CORONARY ANGIOGRAM;  Surgeon: [name], MD;  Location: [facility] CATH LAB;  Service: Cardiovascular;  Laterality: N/A;  . LEFT AND RIGHT HEART CATHETERIZATION WITH CORONARY ANGIOGRAM N/A 05/23/2014   Procedure: LEFT AND RIGHT HEART CATHETERIZATION WITH CORONARY ANGIOGRAM;  Surgeon: [name], MD;  Location: [facility] CATH LAB;  Service: Cardiovascular;  Laterality: N/A;  . MULTIPLE EXTRACTIONS WITH ALVEOLOPLASTY N/A 02/27/2014   Procedure: Extraction of tooth #'s 2,4,5,6,7,8,9,10,11,12, 22, 23, 24, 25, 26, 28 with alveoloplasty and bilateral mandibular tori reductions;  Surgeon: [name], DDS;  Location: [facility];  Service: Oral Surgery;  Laterality: N/A;  . RIGID BRONCHOSCOPY N/A 01/14/2015   Procedure: RIGID BRONCHOSCOPY with Removal Of Foreign Body ;  Surgeon: [name], MD;  Location: [facility];  Service: Thoracic;  Laterality: N/A;  . TEE WITHOUT CARDIOVERSION N/A 03/24/2015   Procedure: TRANSESOPHAGEAL ECHOCARDIOGRAM (TEE);  Surgeon: [name], MD;  Location: [place];  Service: Open Heart Surgery;  Laterality: N/A;  . TRANSCATHETER AORTIC VALVE REPLACEMENT, TRANSFEMORAL Bilateral 03/24/2015   Procedure: TRANSCATHETER AORTIC VALVE REPLACEMENT, TRANSFEMORAL;  Surgeon: [name], MD;  Location: [facility];  Service: Open Heart Surgery;  Laterality: Bilateral;  . TUBAL LIGATION  1987  . VIDEO  BRONCHOSCOPY Bilateral 06/05/2014   Procedure: VIDEO BRONCHOSCOPY WITHOUT FLUORO;  Surgeon: [name], MD;  Location: [facility] ENDOSCOPY;  Service: Cardiopulmonary;  Laterality: Bilateral;    Social History Social History  Substance Use Topics  . Smoking status: Never Smoker  . Smokeless tobacco: Never Used  . Alcohol use No    Allergies  Allergen Reactions  . Crestor [Rosuvastatin] Other (See  Comments)    Muscle aches and elevated CK  . Penicillins Other (See Comments)    Family unsure of reaction, but certain mom said she was allergic    Current Meds  Medication Sig  . acetaminophen (TYLENOL) 500 MG tablet Take 1,000 mg by mouth every 6 (six) hours as needed for moderate pain.  Marland Kitchen aspirin EC 81 MG tablet Take 81 mg by mouth daily.  . carvedilol (COREG) 25 MG tablet TAKE 1 TABLET BY MOUTH TWICE DAILY WITH MEALS.  Marland Kitchen EASYMAX TEST test strip Check blood sugar three times daily dx E10.65  . ezetimibe (ZETIA) 10 MG tablet TAKE ONE TABLET BY MOUTH ONCE DAILY.  . fenofibrate (TRICOR) 145 MG tablet Take 1 tablet (145 mg total) by mouth daily.  . furosemide (LASIX) 40 MG tablet Take 1 tablet (40 mg total) by mouth every morning.  Marland Kitchen glipiZIDE (GLIPIZIDE XL) 5 MG 24 hr tablet Take 1 tablet (5 mg total) by mouth daily with breakfast.  . insulin aspart protamine - aspart (NOVOLOG MIX 70/30 FLEXPEN) (70-30) 100 UNIT/ML FlexPen Inject 0.12 mLs (12 Units total) into the skin 2 (two) times daily.  . Lancets MISC Three times daily testing dx   e10..65 for easymax meter  . Multiple Vitamins-Minerals (MULTIVITAMIN PO) Take 1 tablet by mouth daily.  . niacin (NIASPAN) 500 MG CR tablet Take 1 tablet (500 mg total) by mouth at bedtime.  . potassium chloride (K-DUR) 10 MEQ tablet TAKE ONE TABLET BY MOUTH DAILY. DO NOT TAKE IF NOT TAKING FUROSEMIDE.  Marland Kitchen spironolactone (ALDACTONE) 25 MG tablet Take 1 tablet (25 mg total) by mouth daily.  Marland Kitchen UNIFINE PENTIPS 31G X 8 MM MISC USE AS NEEDED TO INJECT INSULIN.      Physical Exam Physical Exam There were no vitals taken for this visit.  Gen. appearance. The patient is well-developed and well-nourished, grooming and hygiene are normal. There are no gross congenital abnormalities  The patient is alert and oriented to person place and time  Mood and affect are normal  Ambulation No assistive devices noted but she does have a limp favoring her left  leg  Examination reveals the following: On inspection we find varus alignment with medial joint line tenderness  With the range of motion of  115 of flexion  Stability tests were normal    Strength tests revealed grade 5 motor strength  Skin we find no rash ulceration or erythema  Sensation remains intact  Impression vascular system shows no peripheral edema  Data Reviewed X-rays today show severe arthritis and varus alignment of the left knee  Assessment    Encounter Diagnoses  Name Primary?  . Chronic pain of left knee   . Primary osteoarthritis of left knee Yes       Plan    You have received an injection of steroids into the joint. 15% of patients will have increased pain within the 24 hours postinjection.   This is transient and will go away.   We recommend that you use ice packs on the injection site for 20 minutes every 2 hours and extra  strength Tylenol 2 tablets every 8 as needed until the pain resolves.  If you continue to have pain after taking the Tylenol and using the ice please call the office for further instructions.  The patient has ejection fraction 25% status post aortic valve replacement. At this time she is not a surgical candidate based on her medical condition  She is approved to have injections as needed  Repeat cortisone injection left knee joint  Procedure note left knee injection verbal consent was obtained to inject left knee joint  Timeout was completed to confirm the site of injection  The medications used were 40 mg of Depo-Medrol and 1% lidocaine 3 cc  Anesthesia was provided by ethyl chloride and the skin was prepped with alcohol.  After cleaning the skin with alcohol a 20-gauge needle was used to inject the left knee joint. There were no complications. A sterile bandage was applied.         Arther Abbott 06/21/2016, 10:26 AM

## 2016-06-21 NOTE — Patient Instructions (Signed)

## 2016-06-22 DIAGNOSIS — E785 Hyperlipidemia, unspecified: Secondary | ICD-10-CM | POA: Diagnosis not present

## 2016-06-22 DIAGNOSIS — E1322 Other specified diabetes mellitus with diabetic chronic kidney disease: Secondary | ICD-10-CM | POA: Diagnosis not present

## 2016-06-22 DIAGNOSIS — N184 Chronic kidney disease, stage 4 (severe): Secondary | ICD-10-CM | POA: Diagnosis not present

## 2016-06-22 DIAGNOSIS — I10 Essential (primary) hypertension: Secondary | ICD-10-CM | POA: Diagnosis not present

## 2016-06-22 DIAGNOSIS — E1365 Other specified diabetes mellitus with hyperglycemia: Secondary | ICD-10-CM | POA: Diagnosis not present

## 2016-06-22 LAB — HEMOGLOBIN A1C
Hgb A1c MFr Bld: 7.3 % — ABNORMAL HIGH (ref <5.7)
Mean Plasma Glucose: 163 mg/dL

## 2016-06-23 ENCOUNTER — Encounter: Payer: Self-pay | Admitting: Family Medicine

## 2016-06-23 ENCOUNTER — Ambulatory Visit (INDEPENDENT_AMBULATORY_CARE_PROVIDER_SITE_OTHER): Payer: PPO | Admitting: Family Medicine

## 2016-06-23 VITALS — BP 114/70 | HR 69 | Resp 16 | Ht 65.0 in | Wt 186.0 lb

## 2016-06-23 DIAGNOSIS — Z23 Encounter for immunization: Secondary | ICD-10-CM | POA: Diagnosis not present

## 2016-06-23 DIAGNOSIS — E785 Hyperlipidemia, unspecified: Secondary | ICD-10-CM

## 2016-06-23 DIAGNOSIS — E559 Vitamin D deficiency, unspecified: Secondary | ICD-10-CM | POA: Diagnosis not present

## 2016-06-23 DIAGNOSIS — I5042 Chronic combined systolic (congestive) and diastolic (congestive) heart failure: Secondary | ICD-10-CM

## 2016-06-23 DIAGNOSIS — E119 Type 2 diabetes mellitus without complications: Secondary | ICD-10-CM

## 2016-06-23 DIAGNOSIS — E1322 Other specified diabetes mellitus with diabetic chronic kidney disease: Secondary | ICD-10-CM

## 2016-06-23 DIAGNOSIS — IMO0001 Reserved for inherently not codable concepts without codable children: Secondary | ICD-10-CM

## 2016-06-23 DIAGNOSIS — E1365 Other specified diabetes mellitus with hyperglycemia: Secondary | ICD-10-CM

## 2016-06-23 DIAGNOSIS — I10 Essential (primary) hypertension: Secondary | ICD-10-CM

## 2016-06-23 DIAGNOSIS — IMO0002 Reserved for concepts with insufficient information to code with codable children: Secondary | ICD-10-CM

## 2016-06-23 DIAGNOSIS — Z794 Long term (current) use of insulin: Secondary | ICD-10-CM

## 2016-06-23 DIAGNOSIS — N184 Chronic kidney disease, stage 4 (severe): Secondary | ICD-10-CM

## 2016-06-23 LAB — COMPLETE METABOLIC PANEL WITH GFR
ALT: 17 U/L (ref 6–29)
AST: 23 U/L (ref 10–35)
Albumin: 4 g/dL (ref 3.6–5.1)
Alkaline Phosphatase: 43 U/L (ref 33–130)
BUN: 29 mg/dL — ABNORMAL HIGH (ref 7–25)
CO2: 28 mmol/L (ref 20–31)
Calcium: 9.6 mg/dL (ref 8.6–10.4)
Chloride: 107 mmol/L (ref 98–110)
Creat: 1.46 mg/dL — ABNORMAL HIGH (ref 0.60–0.93)
GFR, Est African American: 41 mL/min — ABNORMAL LOW (ref >=60)
GFR, Est Non African American: 36 mL/min — ABNORMAL LOW (ref >=60)
Glucose, Bld: 138 mg/dL — ABNORMAL HIGH (ref 65–99)
Potassium: 4.2 mmol/L (ref 3.5–5.3)
Sodium: 141 mmol/L (ref 135–146)
Total Bilirubin: 0.6 mg/dL (ref 0.2–1.2)
Total Protein: 6.6 g/dL (ref 6.1–8.1)

## 2016-06-23 LAB — LIPID PANEL
Cholesterol: 194 mg/dL (ref <200)
HDL: 60 mg/dL (ref >50)
LDL Cholesterol: 121 mg/dL — ABNORMAL HIGH
Total CHOL/HDL Ratio: 3.2 Ratio (ref <5.0)
Triglycerides: 66 mg/dL (ref <150)
VLDL: 13 mg/dL (ref <30)

## 2016-06-23 MED ORDER — PITAVASTATIN CALCIUM 2 MG PO TABS: 1.0000 | ORAL_TABLET | Freq: Every day | ORAL | 4 refills | Status: DC

## 2016-06-23 NOTE — Assessment & Plan Note (Signed)
Improved markedly, pt applauded on this [name] [name] is reminded of the importance of commitment to daily physical activity for 30 minutes or more, as able and the need to limit carbohydrate intake to 30 to 60 grams per meal to help with blood sugar control.   The need to take medication as prescribed, test blood sugar as directed, and to call between visits if there is a concern that blood sugar is uncontrolled is also discussed.   [name] [name] is reminded of the importance of daily foot exam, annual eye examination, and good blood sugar, blood pressure and cholesterol control.  Diabetic Labs Latest Ref Rng & Units 06/22/2016 03/29/2016 03/25/2016 01/01/2016 10/07/2015  HbA1c <5.7 % 7.3(H) - 9.0(H) - 7.2(H)  Microalbumin Not Estab. ug/mL - <3.0(H) - - -  Micro/Creat Ratio 0.0 - 30.0 mg/g creat - <10.5 - - -  Chol <200 mg/dL 194 - 226(H) - -  HDL >50 mg/dL 60 - 57 - -  Calc LDL mg/dL 121(H) - 149(H) - -  Triglycerides <150 mg/dL 66 - 101 - -  Creatinine 0.60 - 0.93 mg/dL 1.46(H) - 1.75(H) 1.86(H) 1.82(H)   BP/Weight 06/23/2016 05/17/2016 04/07/2016 03/29/2016 03/16/2016 01/15/2016 4/81/8590  Systolic BP 931 121 624 469 507 225 750  Diastolic BP 70 61 80 80 80 72 78  Wt. (Lbs) 186 188 186.8 191.8 191 188.4 188.3  BMI 30.95 31.28 30.15 33.98 33.83 30.42 30.41   Foot/eye exam completion dates Latest Ref Rng & Units 05/31/2016 03/29/2016  Eye Exam No Retinopathy No Retinopathy -  Foot exam Order - - -  Foot Form Completion - - Done

## 2016-06-23 NOTE — Assessment & Plan Note (Signed)
Improved, but not at gpoal, trial of livalo Hyperlipidemia:Low fat diet discussed and encouraged.   Lipid Panel  Lab Results  Component Value Date   CHOL 194 06/22/2016   HDL 60 06/22/2016   LDLCALC 121 (H) 06/22/2016   TRIG 66 06/22/2016   CHOLHDL 3.2 06/22/2016   Continue fenofibrate

## 2016-06-23 NOTE — Progress Notes (Signed)
[name] [name]     MRN: [id number]      DOB: Jun 17, 1946   HPI [name] [name] is here for follow up and re-evaluation of chronic medical conditions, medication management and review of any available recent lab and radiology data.  Preventive health is updated, specifically  Cancer screening and Immunization.    [name] [name] denies any adverse reactions to current medications since [name] last visit.  There are no new concerns. Still misses her husband and feels lonely , but getting better There are no specific complaints  Denies polyuria, polydipsia, blurred vision , or hypoglycemic episodes. Excellent change in diet with markedly improved labs  ROS Denies recent fever or chills. Denies sinus pressure, nasal congestion, ear pain or sore throat. Denies chest congestion, productive cough or wheezing. Denies chest pains, palpitations and leg swelling Denies abdominal pain, nausea, vomiting,diarrhea or constipation.   Denies dysuria, frequency, hesitancy or incontinence. Denies joint pain, swelling and limitation in mobility. Denies headaches, seizures, numbness, or tingling.  Denies skin break down or rash.   PE  BP 114/70   Pulse 69   Resp 16   Ht 5\' 5"  (1.651 m)   Wt 186 lb (84.4 kg)   SpO2 99%   BMI 30.95 kg/m   Patient alert and oriented and in no cardiopulmonary distress.  HEENT: No facial asymmetry, EOMI,   oropharynx pink and moist.  Neck supple no JVD, no mass.  Chest: Clear to auscultation bilaterally.  CVS: S1, S2 systolic  murmus, no S3.Regular rate.  ABD: Soft non tender.   Ext: No edema  MS: Adequate ROM spine, shoulders, hips and knees.  Skin: Intact, no ulcerations or rash noted.  Psych: Good eye contact, normal affect. Memory intact not anxious or depressed appearing.  CNS: CN 2-12 intact, power,  normal throughout.no focal deficits noted.   Assessment & Plan  Diabetes mellitus, insulin dependent (IDDM), controlled (HCC) Improved markedly, [name] applauded  on this [name] [name] is reminded of [name] importance of commitment to daily physical activity for 30 minutes or more, as able and [name] need to limit carbohydrate intake to 30 to 60 grams per meal to help with blood sugar control.   [name] need to take medication as prescribed, test blood sugar as directed, and to call between visits if there is a concern that blood sugar is uncontrolled is also discussed.   [name] [name] is reminded of [name] importance of daily foot exam, annual eye examination, and good blood sugar, blood pressure and cholesterol control.  Diabetic Labs Latest Ref Rng & Units 06/22/2016 03/29/2016 03/25/2016 01/01/2016 10/07/2015  HbA1c <5.7 % 7.3(H) - 9.0(H) - 7.2(H)  Microalbumin Not Estab. ug/mL - <3.0(H) - - -  Micro/Creat Ratio 0.0 - 30.0 mg/g creat - <10.5 - - -  Chol <200 mg/dL 194 - 226(H) - -  HDL >50 mg/dL 60 - 57 - -  Calc LDL mg/dL 121(H) - 149(H) - -  Triglycerides <150 mg/dL 66 - 101 - -  Creatinine 0.60 - 0.93 mg/dL 1.46(H) - 1.75(H) 1.86(H) 1.82(H)   BP/Weight 06/23/2016 05/17/2016 04/07/2016 03/29/2016 03/16/2016 01/15/2016 8/85/0277  Systolic BP 412 878 676 720 947 096 283  Diastolic BP 70 61 80 80 80 72 78  Wt. (Lbs) 186 188 186.8 191.8 191 188.4 188.3  BMI 30.95 31.28 30.15 33.98 33.83 30.42 30.41   Foot/eye exam completion dates Latest Ref Rng & Units 05/31/2016 03/29/2016  Eye Exam No Retinopathy No Retinopathy -  Foot exam Order - - -  Foot Form Completion - - Done        Hyperlipidemia LDL goal <100 Improved, but not at gpoal, trial of livalo Hyperlipidemia:Low fat diet discussed and encouraged.   Lipid Panel  Lab Results  Component Value Date   CHOL 194 06/22/2016   HDL 60 06/22/2016   LDLCALC 121 (H) 06/22/2016   TRIG 66 06/22/2016   CHOLHDL 3.2 06/22/2016   Continue fenofibrate   Essential hypertension, benign Controlled, no change in medication DASH diet and commitment to daily physical activity for a minimum of 30 minutes discussed and  encouraged, as a part of hypertension management. [name] importance of attaining a healthy weight is also discussed.  BP/Weight 06/23/2016 05/17/2016 04/07/2016 03/29/2016 03/16/2016 01/15/2016 9/37/9024  Systolic BP 097 353 299 242 683 419 622  Diastolic BP 70 61 80 80 80 72 78  Wt. (Lbs) 186 188 186.8 191.8 191 188.4 188.3  BMI 30.95 31.28 30.15 33.98 33.83 30.42 30.41       Chronic combined systolic and diastolic CHF (congestive heart failure) ([place]) Clinically stable and controlled on current regime, f/u with cardiology in 2 months

## 2016-06-23 NOTE — Assessment & Plan Note (Signed)
Controlled, no change in medication DASH diet and commitment to daily physical activity for a minimum of 30 minutes discussed and encouraged, as a part of hypertension management. The importance of attaining a healthy weight is also discussed.  BP/Weight 06/23/2016 05/17/2016 04/07/2016 03/29/2016 03/16/2016 01/15/2016 0/10/4915  Systolic BP 915 056 979 480 165 537 482  Diastolic BP 70 61 80 80 80 72 78  Wt. (Lbs) 186 188 186.8 191.8 191 188.4 188.3  BMI 30.95 31.28 30.15 33.98 33.83 30.42 30.41

## 2016-06-23 NOTE — Patient Instructions (Addendum)
Annual exam in end Feb. Call if you need me before  Flu vaccine today  CONGRATS on excellent labs  New extra medication for cholesterol livalo one at night  Fasting lipid, cmp and EGFR, HBA1C, cBC, tSH and vit D 1 week befor appt   Please seriously look at joining [facility]

## 2016-06-23 NOTE — Telephone Encounter (Signed)
Patient dropped off her blood sugar readings

## 2016-06-23 NOTE — Assessment & Plan Note (Signed)
Clinically stable and controlled on current regime, f/u with cardiology in 2 months

## 2016-06-24 ENCOUNTER — Other Ambulatory Visit: Payer: Self-pay | Admitting: Family Medicine

## 2016-09-08 ENCOUNTER — Telehealth: Payer: Self-pay | Admitting: Family Medicine

## 2016-09-08 NOTE — Telephone Encounter (Signed)
Patient is asking for a returned call from the nurse, she is asking for medication for a cold, please advise?

## 2016-09-09 ENCOUNTER — Other Ambulatory Visit: Payer: Self-pay | Admitting: Family Medicine

## 2016-09-09 NOTE — Telephone Encounter (Signed)
Patient states that she is taking Tylenol and Diabetic Tussin.  Is feeling some better.  Will continue this and increase fluids and rest.

## 2016-09-14 ENCOUNTER — Ambulatory Visit: Payer: PPO | Admitting: Cardiology

## 2016-09-23 ENCOUNTER — Other Ambulatory Visit: Payer: Self-pay | Admitting: Family Medicine

## 2016-09-26 ENCOUNTER — Encounter: Payer: Self-pay | Admitting: Cardiology

## 2016-09-26 ENCOUNTER — Ambulatory Visit (INDEPENDENT_AMBULATORY_CARE_PROVIDER_SITE_OTHER): Payer: PPO | Admitting: Cardiology

## 2016-09-26 VITALS — BP 108/68 | HR 78 | Ht 65.0 in | Wt 186.0 lb

## 2016-09-26 DIAGNOSIS — Z952 Presence of prosthetic heart valve: Secondary | ICD-10-CM | POA: Diagnosis not present

## 2016-09-26 DIAGNOSIS — E782 Mixed hyperlipidemia: Secondary | ICD-10-CM

## 2016-09-26 DIAGNOSIS — I428 Other cardiomyopathies: Secondary | ICD-10-CM

## 2016-09-26 DIAGNOSIS — N183 Chronic kidney disease, stage 3 unspecified: Secondary | ICD-10-CM

## 2016-09-26 NOTE — Patient Instructions (Signed)
Your physician wants you to follow-up in: 6 months Dr McDowell You will receive a reminder letter in the mail two months in advance. If you don't receive a letter, please call our office to schedule the follow-up appointment.  Your physician recommends that you continue on your current medications as directed. Please refer to the Current Medication list given to you today.    If you need a refill on your cardiac medications before your next appointment, please call your pharmacy.       Thank you for choosing [facility] !         

## 2016-09-26 NOTE — Progress Notes (Signed)
Cardiology Office Note  Date: 09/26/2016   ID: Damiya, Sandefur 01-01-1946, MRN [id number]  PCP: [name], MD  Primary Cardiologist: Rozann Lesches, MD   Chief Complaint  Patient presents with  . Cardiomyopathy    History of Present Illness: [name] [name] is a 71 y.o. female last seen in August 2017. She presents for a routine follow-up visit. States that she is getting over a cold. Also stepped awkwardly when she was out in her yard and twisted her right ankle. She's had some swelling there but it is better with ice packs. She will be seeing Dr. Moshe Cipro later this month.  From a cardiac perspective she reports no increasing shortness of breath, no angina symptoms or palpitations.  Follow-up echocardiogram from August 2017 is outlined below. Bioprosthetic TAVR functioning normally. LVEF 25% with diffuse hypokinesis. She has known history of nonischemic cardiomyopathy, has declined ICD.  Current cardiac regimen includes aspirin, Coreg, Lasix, Aldactone, and potassium supplement. She is not on ACE inhibitor or ARB with low-normal blood pressure.  Past Medical History:  Diagnosis Date  . Anxiety   . Aortic stenosis   . Arthritis   . Chronic systolic heart failure ([place])   . CKD (chronic kidney disease) stage 3, GFR 30-59 ml/min   . Constipation   . Depression   . Diabetes mellitus, type 2 ([place])   . Essential hypertension, benign   . Hyperlipidemia   . Incidental pulmonary nodule, > 1mm and < 83mm    7 x 4 mm LLL nodule  . LBBB (left bundle branch block)   . Nonischemic cardiomyopathy (HCC)    No significant obstructive CAD at heart catheterization 05/2014, LVEF 20%  . Obesity   . Pneumonia 2013  . 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 Symptomatic PVCs     Past Surgical History:  Procedure Laterality Date  . CARDIAC CATHETERIZATION    .  CESAREAN SECTION  1987  . FLEXIBLE BRONCHOSCOPY N/A 01/14/2015   Procedure: FLEXIBLE BRONCHOSCOPY;  Surgeon: [name], MD;  Location: [place];  Service: Thoracic;  Laterality: N/A;  . LEFT AND RIGHT HEART CATHETERIZATION WITH CORONARY ANGIOGRAM N/A 12/05/2011   Procedure: LEFT AND RIGHT HEART CATHETERIZATION WITH CORONARY ANGIOGRAM;  Surgeon: [name], MD;  Location: [facility] CATH LAB;  Service: Cardiovascular;  Laterality: N/A;  . LEFT AND RIGHT HEART CATHETERIZATION WITH CORONARY ANGIOGRAM N/A 05/23/2014   Procedure: LEFT AND RIGHT HEART CATHETERIZATION WITH CORONARY ANGIOGRAM;  Surgeon: [name], MD;  Location: [facility] CATH LAB;  Service: Cardiovascular;  Laterality: N/A;  . MULTIPLE EXTRACTIONS WITH ALVEOLOPLASTY N/A 02/27/2014   Procedure: Extraction of tooth #'s 2,4,5,6,7,8,9,10,11,12, 22, 23, 24, 25, 26, 28 with alveoloplasty and bilateral mandibular tori reductions;  Surgeon: [name], DDS;  Location: [place];  Service: Oral Surgery;  Laterality: N/A;  . RIGID BRONCHOSCOPY N/A 01/14/2015   Procedure: RIGID BRONCHOSCOPY with Removal Of Foreign Body ;  Surgeon: [name], MD;  Location: [place];  Service: Thoracic;  Laterality: N/A;  . TEE WITHOUT CARDIOVERSION N/A 03/24/2015   Procedure: TRANSESOPHAGEAL ECHOCARDIOGRAM (TEE);  Surgeon: [name], MD;  Location: [place];  Service: Open Heart Surgery;  Laterality: N/A;  . TRANSCATHETER AORTIC VALVE REPLACEMENT, TRANSFEMORAL Bilateral 03/24/2015   Procedure: TRANSCATHETER AORTIC VALVE REPLACEMENT, TRANSFEMORAL;  Surgeon: [name], MD;  Location: [place];  Service: Open Heart  Surgery;  Laterality: Bilateral;  . TUBAL LIGATION  1987  . VIDEO BRONCHOSCOPY Bilateral 06/05/2014   Procedure: VIDEO BRONCHOSCOPY WITHOUT FLUORO;  Surgeon: [name], MD;  Location[facility] ENDOSCOPY;  Service: Cardiopulmonary;  Laterality: Bilateral;    Current Outpatient Prescriptions  Medication Sig Dispense  Refill  . acetaminophen (TYLENOL) 500 MG tablet Take 1,000 mg by mouth every 6 (six) hours as needed for moderate pain.    Marland Kitchen aspirin EC 81 MG tablet Take 81 mg by mouth daily.    . carvedilol (COREG) 25 MG tablet TAKE 1 TABLET BY MOUTH TWICE DAILY WITH MEALS. 60 tablet 6  . EASYMAX TEST test strip Check blood sugar three times daily dx E10.65 100 each 5  . ezetimibe (ZETIA) 10 MG tablet TAKE ONE TABLET BY MOUTH ONCE DAILY. 30 tablet 3  . fenofibrate (TRICOR) 145 MG tablet TAKE ONE TABLET BY MOUTH ONCE DAILY. 30 tablet 2  . furosemide (LASIX) 40 MG tablet Take 1 tablet (40 mg total) by mouth every morning. 30 tablet 4  . GLIPIZIDE XL 5 MG 24 hr tablet TAKE 1 TABLET BY MOUTH DAILY WITH BREAKFAST. 90 tablet 1  . insulin aspart protamine - aspart (NOVOLOG MIX 70/30 FLEXPEN) (70-30) 100 UNIT/ML FlexPen Inject 0.12 mLs (12 Units total) into the skin 2 (two) times daily. 15 mL 11  . Lancets MISC Three times daily testing dx   e10..65 for easymax meter 100 each 5  . Multiple Vitamins-Minerals (MULTIVITAMIN PO) Take 1 tablet by mouth daily.    . niacin (NIASPAN) 500 MG CR tablet Take 1 tablet (500 mg total) by mouth at bedtime. 30 tablet 5  . Pitavastatin Calcium 2 MG TABS Take 1 tablet (2 mg total) by mouth daily after supper. 30 tablet 4  . potassium chloride (K-DUR) 10 MEQ tablet TAKE ONE TABLET BY MOUTH DAILY. DO NOT TAKE IF NOT TAKING FUROSEMIDE. 30 tablet 3  . spironolactone (ALDACTONE) 25 MG tablet Take 1 tablet (25 mg total) by mouth daily. 90 tablet 3  . UNIFINE PENTIPS 31G X 8 MM MISC USE AS NEEDED TO INJECT INSULIN. 100 each 2   No current facility-administered medications for this visit.    Allergies:  Crestor [rosuvastatin] and Penicillins   Social History: The patient  reports that she has never smoked. She has never used smokeless tobacco. She reports that she does not drink alcohol or use drugs.   ROS:  Please see the history of present illness. Otherwise, complete review of systems is  positive for none.  All other systems are reviewed and negative.   Physical Exam: VS:  BP 108/68   Pulse 78   Ht 5\' 5"  (1.651 m)   Wt 186 lb (84.4 kg)   SpO2 99%   BMI 30.95 kg/m , BMI Body mass index is 30.95 kg/m.  Wt Readings from Last 3 Encounters:  09/26/16 186 lb (84.4 kg)  06/23/16 186 lb (84.4 kg)  05/17/16 188 lb (85.3 kg)    Overweight woman, in no distress  HEENT: Conjunctiva and lids normal, oropharynx clear.  Neck: Supple, no carotid bruits, no thyromegaly.  Lungs: Clear to auscultation, nonlabored breathing at rest.  Cardiac: Regular rate and rhythm, indistinct PMI, no S3, 3/6 systolic murmur, no pericardial rub.  Abdomen: Soft, nontender, bowel sounds present, no guarding or rebound.  Extremities: Mild right ankle edema, distal pulses 2+.  Skin: Warm and dry. Musculoskeletal: No kyphosis. Neuropsychiatric: Alert and oriented 3, affect appropriate.  ECG: I personally reviewed the  tracing from 01/01/2016 which showed sinus rhythm with left atrial enlargement and left bundle branch block.  Recent Labwork: 10/07/2015: TSH 3.88 01/01/2016: B Natriuretic Peptide 186.0; Hemoglobin 10.9; Platelets 178 06/22/2016: ALT 17; AST 23; BUN 29; Creat 1.46; Potassium 4.2; Sodium 141     Component Value Date/Time   CHOL 194 06/22/2016 0904   TRIG 66 06/22/2016 0904   HDL 60 06/22/2016 0904   CHOLHDL 3.2 06/22/2016 0904   VLDL 13 06/22/2016 0904   LDLCALC 121 (H) 06/22/2016 0904    Other Studies Reviewed Today:  Echocardiogram 04/07/2016: Study Conclusions  - Left ventricle: The cavity size was mildly dilated. Wall   thickness was increased in a pattern of mild LVH. The estimated   ejection fraction was 25%. Diffuse hypokinesis. Septal-lateral   dyssynchrony. Doppler parameters are consistent with abnormal   left ventricular relaxation (grade 1 diastolic dysfunction). - Aortic valve: There was a bioprosthetic aortic valve s/p   transcatheter aortic valve  replacement. The valve was   well-seated. There was no regurgitation. Mean gradient (S): 15 mm   Hg. - Aorta: Mildly dilated ascending aorta. Ascending aortic diameter:   37 mm (S). - Mitral valve: Mildly calcified annulus. There was mild   regurgitation. - Left atrium: The atrium was at the upper limits of normal in   size. - Right ventricle: The cavity size was normal. Systolic function   was normal. - Tricuspid valve: Peak RV-RA gradient (S): 24 mm Hg. - Pulmonary arteries: PA peak pressure: 27 mm Hg (S). - Inferior vena cava: The vessel was normal in size. The   respirophasic diameter changes were in the normal range (= 50%),   consistent with normal central venous pressure.  Impressions:  - Mildly dilated LV with mild LV hypertrophy. EF 25% with diffuse   hypokinesis and septal-lateral dyssynchrony. Normal RV size and   systolic function. Bioprosthetic aortic valve s/p TAVR appeared   to function normally.  Assessment and Plan:  1. Aortic stenosis status post TAVR in August 2016. She is symptomatically stable had a follow-up echocardiogram last year.  2. 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 She remains clinically stable on medical therapy. She has declined ICD. Not on Ace inhibitor or ARB with low-normal blood pressure and renal insufficiency.  3. CKD, stage 3. Last creatinine 1.5.  4. Hyperlipidemia, on Zetia and TriCor. LDL 121 in November 2017. Triglycerides normal.  Current medicines were reviewed with the patient today.  Disposition: Follow-up in 6 months.  Signed, [name], MD, [facility] 09/26/2016 11:37 AM    [place] [place]. [address] [place], [place] [address] Phone: [phone]; Fax: [phone]

## 2016-09-27 DIAGNOSIS — I10 Essential (primary) hypertension: Secondary | ICD-10-CM | POA: Diagnosis not present

## 2016-09-27 DIAGNOSIS — N184 Chronic kidney disease, stage 4 (severe): Secondary | ICD-10-CM | POA: Diagnosis not present

## 2016-09-27 DIAGNOSIS — E1365 Other specified diabetes mellitus with hyperglycemia: Secondary | ICD-10-CM | POA: Diagnosis not present

## 2016-09-27 DIAGNOSIS — E785 Hyperlipidemia, unspecified: Secondary | ICD-10-CM | POA: Diagnosis not present

## 2016-09-27 DIAGNOSIS — E559 Vitamin D deficiency, unspecified: Secondary | ICD-10-CM | POA: Diagnosis not present

## 2016-09-27 DIAGNOSIS — E1322 Other specified diabetes mellitus with diabetic chronic kidney disease: Secondary | ICD-10-CM | POA: Diagnosis not present

## 2016-09-27 LAB — COMPLETE METABOLIC PANEL WITH GFR
ALT: 14 U/L (ref 6–29)
AST: 21 U/L (ref 10–35)
Albumin: 4.1 g/dL (ref 3.6–5.1)
Alkaline Phosphatase: 38 U/L (ref 33–130)
BUN: 34 mg/dL — ABNORMAL HIGH (ref 7–25)
CO2: 26 mmol/L (ref 20–31)
Calcium: 9.8 mg/dL (ref 8.6–10.4)
Chloride: 103 mmol/L (ref 98–110)
Creat: 2.14 mg/dL — ABNORMAL HIGH (ref 0.60–0.93)
GFR, Est African American: 26 mL/min — ABNORMAL LOW (ref >=60)
GFR, Est Non African American: 23 mL/min — ABNORMAL LOW (ref >=60)
Glucose, Bld: 195 mg/dL — ABNORMAL HIGH (ref 65–99)
Potassium: 4.3 mmol/L (ref 3.5–5.3)
Sodium: 140 mmol/L (ref 135–146)
Total Bilirubin: 0.5 mg/dL (ref 0.2–1.2)
Total Protein: 7.2 g/dL (ref 6.1–8.1)

## 2016-09-27 LAB — LIPID PANEL
Cholesterol: 194 mg/dL (ref <200)
HDL: 56 mg/dL (ref >50)
LDL Cholesterol: 119 mg/dL — ABNORMAL HIGH (ref <100)
Total CHOL/HDL Ratio: 3.5 Ratio (ref <5.0)
Triglycerides: 97 mg/dL (ref <150)
VLDL: 19 mg/dL (ref <30)

## 2016-09-27 LAB — CBC
HCT: 37.8 % (ref 35.0–45.0)
Hemoglobin: 12.2 g/dL (ref 11.7–15.5)
MCH: 27.7 pg (ref 27.0–33.0)
MCHC: 32.3 g/dL (ref 32.0–36.0)
MCV: 85.9 fL (ref 80.0–100.0)
MPV: 10.4 fL (ref 7.5–12.5)
Platelets: 234 10*3/uL (ref 140–400)
RBC: 4.4 MIL/uL (ref 3.80–5.10)
RDW: 15.1 % — ABNORMAL HIGH (ref 11.0–15.0)
WBC: 4.4 10*3/uL (ref 3.8–10.8)

## 2016-09-27 LAB — TSH: TSH: 1.58 mIU/L

## 2016-09-27 LAB — VITAMIN D 25 HYDROXY (VIT D DEFICIENCY, FRACTURES): Vit D, 25-Hydroxy: 35 ng/mL (ref 30–100)

## 2016-09-28 LAB — HEMOGLOBIN A1C
Hgb A1c MFr Bld: 7.6 % — ABNORMAL HIGH (ref <5.7)
Mean Plasma Glucose: 171 mg/dL

## 2016-09-29 ENCOUNTER — Other Ambulatory Visit: Payer: Self-pay | Admitting: Family Medicine

## 2016-09-29 ENCOUNTER — Ambulatory Visit (INDEPENDENT_AMBULATORY_CARE_PROVIDER_SITE_OTHER): Payer: PPO | Admitting: Family Medicine

## 2016-09-29 ENCOUNTER — Encounter: Payer: Self-pay | Admitting: Family Medicine

## 2016-09-29 ENCOUNTER — Ambulatory Visit (INDEPENDENT_AMBULATORY_CARE_PROVIDER_SITE_OTHER): Payer: PPO

## 2016-09-29 VITALS — BP 108/64 | HR 100 | Resp 18 | Ht 65.0 in | Wt 189.0 lb

## 2016-09-29 VITALS — BP 108/64 | HR 84 | Temp 98.7°F | Ht 65.0 in | Wt 189.6 lb

## 2016-09-29 DIAGNOSIS — F4329 Adjustment disorder with other symptoms: Secondary | ICD-10-CM

## 2016-09-29 DIAGNOSIS — Z794 Long term (current) use of insulin: Secondary | ICD-10-CM

## 2016-09-29 DIAGNOSIS — Z13820 Encounter for screening for osteoporosis: Secondary | ICD-10-CM | POA: Diagnosis not present

## 2016-09-29 DIAGNOSIS — Z1211 Encounter for screening for malignant neoplasm of colon: Secondary | ICD-10-CM

## 2016-09-29 DIAGNOSIS — E119 Type 2 diabetes mellitus without complications: Secondary | ICD-10-CM

## 2016-09-29 DIAGNOSIS — Z0000 Encounter for general adult medical examination without abnormal findings: Secondary | ICD-10-CM | POA: Diagnosis not present

## 2016-09-29 DIAGNOSIS — F4381 Prolonged grief disorder: Secondary | ICD-10-CM

## 2016-09-29 DIAGNOSIS — IMO0001 Reserved for inherently not codable concepts without codable children: Secondary | ICD-10-CM

## 2016-09-29 DIAGNOSIS — I10 Essential (primary) hypertension: Secondary | ICD-10-CM

## 2016-09-29 DIAGNOSIS — F4321 Adjustment disorder with depressed mood: Secondary | ICD-10-CM

## 2016-09-29 LAB — POC HEMOCCULT BLD/STL (OFFICE/1-CARD/DIAGNOSTIC): Fecal Occult Blood, POC: NEGATIVE

## 2016-09-29 NOTE — Patient Instructions (Addendum)
[name] [name] , Thank you for taking time to come for your Medicare Wellness Visit. I appreciate your ongoing commitment to your health goals. Please review the following plan we discussed and let me know if I can assist you in the future.   These are the goals we discussed: Goals    . Exercise 3x per week (30 min per time)          Patient would like to increase her exercise to 4 days a weeks for 20-30 minutes at a time.        Screening recommendations: You are due for a colonoscopy and a bone density scan. Your bone density scan was ordered today please call [facility] and schedule this when you are able. If you need help scheduling please give our office a call and we can assist you. Please consider a colonoscopy or stool cards since you have a history of colon cancer in your family.  Abnormal screenings: None  Patient concerns: assistance with food, mortgage and custody of your grandson. I have sent a referral to our care guide Amy. You should recive a call from her within the next month.  Nurse concerns: Please try to increase your physical activity to at least 30 minutes a day at least 3 days a week if you are able.  Next appt: 02/08/2017 at 1:40 pm. Please follow up in 1 year for your annual wellness visit.   Advance directive discussed with patient today. Copy provided for patient to complete at home and have notarized. Patient agrees to have copy sent to our office once it is complete.   Preventive Care 72 Years and Older, Female Preventive care refers to lifestyle choices and visits with your health care provider that can promote health and wellness. What does preventive care include?  A yearly physical exam. This is also called an annual well check.  Dental exams once or twice a year.  Routine eye exams. Ask your health care provider how often you should have your eyes checked.  Personal lifestyle choices, including:  Daily care of your teeth and gums.  Regular  physical activity.  Eating a healthy diet.  Avoiding tobacco and drug use.  Limiting alcohol use.  Taking low-dose aspirin every day.  Taking vitamin and mineral supplements as recommended by your health care provider. What happens during an annual well check? The services and screenings done by your health care provider during your annual well check will depend on your age, overall health, lifestyle risk factors, and family history of disease. Counseling  Your health care provider may ask you questions about your:  Alcohol use.  Tobacco use.  Drug use.  Emotional well-being.  Home and relationship well-being.  Sexual activity.  Eating habits.  History of falls.  Memory and ability to understand (cognition).  Work and work Statistician.  Reproductive health. Screening  You may have the following tests or measurements:  Height, weight, and BMI.  Blood pressure.  Lipid and cholesterol levels. These may be checked every 5 years, or more frequently if you are over 35 years old.  Skin check.  Lung cancer screening. You may have this screening every year starting at age 74 if you have a 30-pack-year history of smoking and currently smoke or have quit within the past 15 years.  Fecal occult blood test (FOBT) of the stool. You may have this test every year starting at age 30.  Flexible sigmoidoscopy or colonoscopy. You may have a sigmoidoscopy every 5  years or a colonoscopy every 10 years starting at age 36.  Hepatitis C blood test.  Diabetes screening. This is done by checking your blood sugar (glucose) after you have not eaten for a while (fasting). You may have this done every 1-3 years.  Bone density scan. This is done to screen for osteoporosis. You may have this done starting at age 36.  Mammogram. This may be done every 1-2 years. Talk to your health care provider about how often you should have regular mammograms. Talk with your health care provider about  your test results, treatment options, and if necessary, the need for more tests. Vaccines  Your health care provider may recommend certain vaccines, such as:  Influenza vaccine. This is recommended every year.  Tetanus, diphtheria, and acellular pertussis (Tdap, Td) vaccine. You may need a Td booster every 10 years.  Zoster vaccine. You may need this after age 27.  Pneumococcal 13-valent conjugate (PCV13) vaccine. One dose is recommended after age 37.  Pneumococcal polysaccharide (PPSV23) vaccine. One dose is recommended after age 41.  Talk to your health care provider about which screenings and vaccines you need and how often you need them. This information is not intended to replace advice given to you by your health care provider. Make sure you discuss any questions you have with your health care provider. Document Released: 08/28/2015 Document Revised: 04/20/2016 Document Reviewed: 06/02/2015 Elsevier Interactive Patient Education  2017 [employer].

## 2016-09-29 NOTE — Progress Notes (Signed)
Subjective:   [name] [name] is a 71 y.o. female who presents for Medicare Annual (Subsequent) preventive examination.  Cardiac Risk Factors include: advanced age (>65men, >79 women);diabetes mellitus;dyslipidemia;hypertension;obesity (BMI >30kg/m2)     Objective:     Vitals: BP 108/64   Pulse 84   Temp 98.7 F (37.1 C) (Oral)   Ht 5\' 5"  (1.651 m)   Wt 189 lb 9.6 oz (86 kg)   SpO2 99%   BMI 31.55 kg/m   Body mass index is 31.55 kg/m.   Tobacco History  Smoking Status  . Never Smoker  Smokeless Tobacco  . Never Used     Counseling given: Not Answered   Past Medical History:  Diagnosis Date  . Anxiety   . Aortic stenosis   . Arthritis   . Chronic systolic heart failure ([place])   . CKD (chronic kidney disease) stage 3, GFR 30-59 ml/min   . Constipation   . Depression   . Diabetes mellitus, type 2 ([place])   . Essential hypertension, benign   . Hyperlipidemia   . Incidental pulmonary nodule, > 77mm and < 3mm    7 x 4 mm LLL nodule  . LBBB (left bundle branch block)   . Nonischemic cardiomyopathy (HCC)    No significant obstructive CAD at heart catheterization 05/2014, LVEF 20%  . Obesity   . Pneumonia 2013  . 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 Symptomatic PVCs    Past Surgical History:  Procedure Laterality Date  . CARDIAC CATHETERIZATION    . CESAREAN SECTION  1987  . FLEXIBLE BRONCHOSCOPY N/A 01/14/2015   Procedure: FLEXIBLE BRONCHOSCOPY;  Surgeon: [name], MD;  Location: [place];  Service: Thoracic;  Laterality: N/A;  . LEFT AND RIGHT HEART CATHETERIZATION WITH CORONARY ANGIOGRAM N/A 12/05/2011   Procedure: LEFT AND RIGHT HEART CATHETERIZATION WITH CORONARY ANGIOGRAM;  Surgeon: [name], MD;  Location: [facility] CATH LAB;  Service: Cardiovascular;  Laterality: N/A;  . LEFT AND RIGHT HEART CATHETERIZATION WITH CORONARY  ANGIOGRAM N/A 05/23/2014   Procedure: LEFT AND RIGHT HEART CATHETERIZATION WITH CORONARY ANGIOGRAM;  Surgeon: [name], MD;  Location: [facility] CATH LAB;  Service: Cardiovascular;  Laterality: N/A;  . MULTIPLE EXTRACTIONS WITH ALVEOLOPLASTY N/A 02/27/2014   Procedure: Extraction of tooth #'s 2,4,5,6,7,8,9,10,11,12, 22, 23, 24, 25, 26, 28 with alveoloplasty and bilateral mandibular tori reductions;  Surgeon: [name], DDS;  Location: [place];  Service: Oral Surgery;  Laterality: N/A;  . RIGID BRONCHOSCOPY N/A 01/14/2015   Procedure: RIGID BRONCHOSCOPY with Removal Of Foreign Body ;  Surgeon: [name], MD;  Location: [place];  Service: Thoracic;  Laterality: N/A;  . TEE WITHOUT CARDIOVERSION N/A 03/24/2015   Procedure: TRANSESOPHAGEAL ECHOCARDIOGRAM (TEE);  Surgeon: [name], MD;  Location: [place];  Service: Open Heart Surgery;  Laterality: N/A;  . TRANSCATHETER AORTIC VALVE REPLACEMENT, TRANSFEMORAL Bilateral 03/24/2015   Procedure: TRANSCATHETER AORTIC VALVE REPLACEMENT, TRANSFEMORAL;  Surgeon: [name], MD;  Location: [place];  Service: Open Heart Surgery;  Laterality: Bilateral;  . TUBAL LIGATION  1987  . VIDEO BRONCHOSCOPY Bilateral 06/05/2014   Procedure: VIDEO BRONCHOSCOPY WITHOUT FLUORO;  Surgeon: [name], MD;  Location: [facility] ENDOSCOPY;  Service: Cardiopulmonary;  Laterality: Bilateral;   Family History  Problem Relation Age of Onset  . Heart disease Mother   . Heart attack Mother 41  . Diabetes  Mother   . Hypertension Mother   . Colon cancer Father 69  . Breast cancer Sister   . Diabetes Brother   . Hypertension Brother   . Stroke Brother   . Cancer Brother   . Congestive Heart Failure Daughter   . Hypertension Son   . High Cholesterol Son   . Congestive Heart Failure Son    History  Sexual Activity  . Sexual activity: No    Outpatient Encounter Prescriptions as of 09/29/2016  Medication Sig  . acetaminophen (TYLENOL) 500  MG tablet Take 1,000 mg by mouth every 6 (six) hours as needed for moderate pain.  Marland Kitchen aspirin EC 81 MG tablet Take 81 mg by mouth daily.  . carvedilol (COREG) 25 MG tablet TAKE 1 TABLET BY MOUTH TWICE DAILY WITH MEALS.  Marland Kitchen EASYMAX TEST test strip Check blood sugar three times daily dx E10.65  . ezetimibe (ZETIA) 10 MG tablet TAKE ONE TABLET BY MOUTH ONCE DAILY.  . fenofibrate (TRICOR) 145 MG tablet TAKE ONE TABLET BY MOUTH ONCE DAILY.  . furosemide (LASIX) 40 MG tablet Take 1 tablet (40 mg total) by mouth every morning.  Marland Kitchen GLIPIZIDE XL 5 MG 24 hr tablet TAKE 1 TABLET BY MOUTH DAILY WITH BREAKFAST.  Marland Kitchen insulin aspart protamine - aspart (NOVOLOG MIX 70/30 FLEXPEN) (70-30) 100 UNIT/ML FlexPen Inject 0.12 mLs (12 Units total) into the skin 2 (two) times daily.  . Lancets MISC Three times daily testing dx   e10..65 for easymax meter  . Multiple Vitamins-Minerals (MULTIVITAMIN PO) Take 1 tablet by mouth daily.  . niacin (NIASPAN) 500 MG CR tablet Take 1 tablet (500 mg total) by mouth at bedtime.  . Pitavastatin Calcium 2 MG TABS Take 1 tablet (2 mg total) by mouth daily after supper.  . potassium chloride (K-DUR) 10 MEQ tablet TAKE ONE TABLET BY MOUTH DAILY. DO NOT TAKE IF NOT TAKING FUROSEMIDE.  Marland Kitchen spironolactone (ALDACTONE) 25 MG tablet Take 1 tablet (25 mg total) by mouth daily.  Marland Kitchen UNIFINE PENTIPS 31G X 8 MM MISC USE AS NEEDED TO INJECT INSULIN.   No facility-administered encounter medications on file as of 09/29/2016.     Activities of Daily Living In your present state of health, do you have any difficulty performing the following activities: 09/29/2016 01/01/2016  Hearing? N N  Vision? N N  Difficulty concentrating or making decisions? N N  Walking or climbing stairs? N N  Dressing or bathing? N N  Doing errands, shopping? Y N  Preparing Food and eating ? N -  Using the Toilet? N -  In the past six months, have you accidently leaked urine? N -  Do you have problems with loss of bowel control?  N -  Managing your Medications? N -  Managing your Finances? N -  Housekeeping or managing your Housekeeping? N -  Some recent data might be hidden    Patient Care Team: Fayrene Helper, MD as PCP - General Evans Lance, MD (Cardiology) [name], MD as Consulting Physician (Cardiology)    Assessment:    Exercise Activities and Dietary recommendations Current Exercise Habits: Home exercise routine, Type of exercise: walking, Time (Minutes): 15, Frequency (Times/Week): 3, Weekly Exercise (Minutes/Week): 45, Intensity: Mild, Exercise limited by: None identified  Goals    . Exercise 3x per week (30 min per time)          Patient would like to increase her exercise to 4 days a weeks for 20-30 minutes at  a time.       Fall Risk Fall Risk  09/29/2016 09/29/2016 05/06/2015 04/23/2014  Falls in the past year? No No No No   Depression Screen PHQ 2/9 Scores 09/29/2016 09/29/2016 10/12/2015 08/18/2015  PHQ - 2 Score 1 0 2 4  PHQ- 9 Score - - - 12     Cognitive Function   Normal 6CIT Screen 09/29/2016  What Year? 0 points  What month? 0 points  What time? 0 points  Count back from 20 0 points  Months in reverse 0 points  Repeat phrase 0 points  Total Score 0    Immunization History  Administered Date(s) Administered  . Influenza Split 06/30/2011  . Influenza Whole 09/18/2007, 05/28/2009, 06/15/2010  . Influenza,inj,Quad PF,36+ Mos 05/21/2013, 04/23/2014, 08/18/2015, 06/23/2016  . Influenza-Unspecified 04/15/2014  . Pneumococcal Conjugate-13 09/16/2014  . Pneumococcal Polysaccharide-23 05/24/2004, 12/20/2010  . Td 05/24/2004  . Tdap 06/30/2011   Screening Tests Health Maintenance  Topic Date Due  . COLONOSCOPY  06/17/1995  . DEXA SCAN  06/16/2010  . ZOSTAVAX  03/29/2017 (Originally 06/16/2005)  . HEMOGLOBIN A1C  03/27/2017  . FOOT EXAM  03/29/2017  . URINE MICROALBUMIN  03/29/2017  . OPHTHALMOLOGY EXAM  05/31/2017  . MAMMOGRAM  10/20/2017  . TETANUS/TDAP   06/29/2021  . INFLUENZA VACCINE  Completed  . Hepatitis C Screening  Completed  . PNA vac Low Risk Adult  Completed      Plan:  I have personally reviewed and addressed the Medicare Annual Wellness questionnaire and have noted the following in the patient's chart:  A. Medical and social history B. Use of alcohol, tobacco or illicit drugs  C. Current medications and supplements D. Functional ability and status E.  Nutritional status F.  Physical activity G. Advance directives H. List of other physicians I.  Hospitalizations, surgeries, and ER visits in previous 12 months J.  [place] to include cognitive, depression, and falls L. Referrals and appointments - none  In addition, I have reviewed and discussed with patient certain preventive protocols, quality metrics, and best practice recommendations. A written personalized care plan for preventive services as well as general preventive health recommendations were provided to patient.  Signed,   Stormy Fabian, LPN Lead Nurse Health Advisor

## 2016-09-29 NOTE — Addendum Note (Signed)
Addended by: Stormy Fabian D on: 09/29/2016 03:44 PM   Modules accepted: Orders, SmartSet

## 2016-09-29 NOTE — Patient Instructions (Addendum)
F/u in  4 month, call if you need me before  We will call back with information on places you can go to for grief sessions/ group therapy  Please cut back on fried and fatty foods  Blood sugar has increased slightly, you need to increase vegetables in your diet and drink only water   HBA1C , and chem 7 EGFR in 4 month  Thank you  for choosing [facility]. We consider it a privelige to serve you.  Delivering excellent health care in a caring and  compassionate way is our goal.  Partnering with you,  so that together we can achieve this goal is our strategy.   Please work on good  health habits so that your health will improve. 1. Commitment to daily physical activity for 30 to 60  minutes, if you are able to do this.  2. Commitment to wise food choices. Aim for half of your  food intake to be vegetable and fruit, one quarter starchy foods, and one quarter protein. Try to eat on a regular schedule  3 meals per day, snacking between meals should be limited to vegetables or fruits or small portions of nuts. 64 ounces of water per day is generally recommended, unless you have specific health conditions, like heart failure or kidney failure where you will need to limit fluid intake.  3. Commitment to sufficient and a  good quality of physical and mental rest daily, generally between 6 to 8 hours per day.  WITH PERSISTANCE AND PERSEVERANCE, THE IMPOSSIBLE , BECOMES THE NORM!

## 2016-09-30 DIAGNOSIS — F4321 Adjustment disorder with depressed mood: Secondary | ICD-10-CM | POA: Insufficient documentation

## 2016-09-30 DIAGNOSIS — F4381 Prolonged grief disorder: Secondary | ICD-10-CM | POA: Insufficient documentation

## 2016-09-30 DIAGNOSIS — F4329 Adjustment disorder with other symptoms: Secondary | ICD-10-CM | POA: Insufficient documentation

## 2016-09-30 NOTE — Progress Notes (Signed)
    [name] [name]     MRN: [id number]      DOB: [birth year]  HPI: Patient is in for annual physical exam. No other health concerns are expressed or addressed at the visit. Recent labs, if available are reviewed. Immunization is reviewed , and  updated if needed.   PE: BP 108/64   Pulse 100   Resp 18   Ht 5\' 5"  (1.651 m)   Wt 189 lb 0.6 oz (85.7 kg)   SpO2 99%   BMI 31.46 kg/m  Pleasant  female, alert and oriented x 3, in no cardio-pulmonary distress. Afebrile. HEENT No facial trauma or asymetry. Sinuses non tender.  Extra occullar muscles intact, pupils equally reactive to light. External ears normal, tympanic membranes clear. Oropharynx moist, no exudate. Neck: supple, no adenopathy,JVD or thyromegaly.No bruits.  Chest: Clear to ascultation bilaterally.No crackles or wheezes. Non tender to palpation  Breast: No asymetry,no masses or lumps. No tenderness. No nipple discharge or inversion. No axillary or supraclavicular adenopathy  Cardiovascular system; Heart sounds normal,  S1 and  S2 ,no S3.  systolic murmur, no thrill. Apical beat not displaced Peripheral pulses normal.  Abdomen: Soft, non tender, no organomegaly or masses. No bruits. Bowel sounds normal. No guarding, tenderness or rebound.  Rectal:  Normal sphincter tone. No rectal mass. Guaiac negative stool.  GU: External genitalia normal female genitalia , normal female distribution of hair. No lesions. Urethral meatus normal in size, no  Prolapse, no lesions visibly  Present. Bladder non tender. Vagina pink and moist , with no visible lesions , discharge present . Adequate pelvic support no  cystocele or rectocele noted Cervix pink and appears healthy, no lesions or ulcerations noted, no discharge noted from os Uterus normal size, no adnexal masses, no cervical motion or adnexal tenderness.   Musculoskeletal exam: Decreased though adequate ROM of spine, hips , shoulders and knees. No deformity  ,swelling or crepitus noted. No muscle wasting or atrophy.   Neurologic: Cranial nerves 2 to 12 intact. Power, tone ,sensation and reflexes normal throughout. No disturbance in gait. No tremor.  Skin: Intact, no ulceration, erythema , scaling or rash noted. Pigmentation normal throughout  Psych; Normal mood and affect. Judgement and concentration normal   Assessment & Plan:  Annual physical exam Annual exam as documented. Counseling done  re healthy lifestyle involving commitment to 150 minutes exercise per week, heart healthy diet, and attaining healthy weight.The importance of adequate sleep also discussed.  Immunization and cancer screening needs are specifically addressed at this visit.   Grief reaction with prolonged bereavement Significant grieving 2 years after loss of spouse , wants help with this, will seek help through outreach resource

## 2016-09-30 NOTE — Assessment & Plan Note (Signed)
Annual exam as documented. Counseling done  re healthy lifestyle involving commitment to 150 minutes exercise per week, heart healthy diet, and attaining healthy weight.The importance of adequate sleep also discussed.  Immunization and cancer screening needs are specifically addressed at this visit.  

## 2016-09-30 NOTE — Assessment & Plan Note (Signed)
Significant grieving 2 years after loss of spouse , wants help with this, will seek help through outreach resource

## 2016-10-04 ENCOUNTER — Encounter: Payer: PPO | Admitting: Family Medicine

## 2016-10-04 ENCOUNTER — Ambulatory Visit: Payer: PPO

## 2016-10-04 ENCOUNTER — Other Ambulatory Visit: Payer: Self-pay

## 2016-10-04 DIAGNOSIS — Z7189 Other specified counseling: Secondary | ICD-10-CM

## 2016-10-18 ENCOUNTER — Other Ambulatory Visit: Payer: Self-pay | Admitting: Family Medicine

## 2016-10-21 ENCOUNTER — Telehealth: Payer: Self-pay | Admitting: Orthopedic Surgery

## 2016-10-21 NOTE — Telephone Encounter (Signed)
Patient called to ask if a brace may help her left knee, as she is not a candidate for surgery at this time. She was last seen 06/21/16; states forgot to ask at that time if Dr Aline Brochure would recommend a brace.  Ph# [phone]

## 2016-10-21 NOTE — Telephone Encounter (Signed)
Routing to Dr. Caliendo to advise 

## 2016-10-24 NOTE — Telephone Encounter (Signed)
Yes call our brace guy and let him arrange to see her for OA brace

## 2016-10-25 NOTE — Telephone Encounter (Signed)
Called patient, no answer 

## 2016-10-27 ENCOUNTER — Other Ambulatory Visit: Payer: Self-pay | Admitting: Family Medicine

## 2016-10-27 NOTE — Telephone Encounter (Signed)
Patient aware and okay with Matt from [facility] contacting her to set up time to come meet him at office

## 2016-11-01 ENCOUNTER — Ambulatory Visit: Payer: PPO

## 2016-11-11 ENCOUNTER — Other Ambulatory Visit: Payer: Self-pay | Admitting: Family Medicine

## 2016-11-11 DIAGNOSIS — E785 Hyperlipidemia, unspecified: Secondary | ICD-10-CM

## 2016-11-14 ENCOUNTER — Other Ambulatory Visit: Payer: Self-pay | Admitting: Cardiology

## 2016-11-14 ENCOUNTER — Telehealth: Payer: Self-pay | Admitting: Family Medicine

## 2016-11-14 NOTE — Telephone Encounter (Signed)
pls call and document dose of med she is actually taking then I will send in dose change so she can get med

## 2016-11-14 NOTE — Telephone Encounter (Signed)
[name] [name] is stating that she is out of [place] at this time and her insurance will not pay for her to refill until 11/18/16 please advise?

## 2016-11-15 NOTE — Telephone Encounter (Signed)
She is taking 12 units daily. Her insurance is cracking down on ppl who are refilling their meds before the true fill date. That is why she is out now but can't fill until Friday.

## 2016-11-15 NOTE — Telephone Encounter (Signed)
Pt to watch sugar and call her insurance for overiide if needed, reportedly good now

## 2016-11-17 ENCOUNTER — Other Ambulatory Visit: Payer: Self-pay | Admitting: Family Medicine

## 2016-11-30 ENCOUNTER — Other Ambulatory Visit: Payer: Self-pay | Admitting: Family Medicine

## 2016-12-05 ENCOUNTER — Telehealth: Payer: Self-pay | Admitting: Orthopedic Surgery

## 2016-12-05 NOTE — Telephone Encounter (Signed)
Patient called back, appointment has been scheduled.

## 2016-12-05 NOTE — Telephone Encounter (Signed)
-----   Message from Uvaldo Bristle sent at 12/02/2016 12:39 PM EDT ----- Regarding: Appointment (called patient) I've called patient and left message re: appt w/Dr Hudson  ----- Message ----- From: Christia Reading, LPN Sent: 04/11/6750   4:39 PM To: Uvaldo Bristle  This patient needs a office visit for medicare to pay for the brace that Matt fitted her for today. Please schedule her to see Dr Aline Brochure for her knees

## 2016-12-05 NOTE — Telephone Encounter (Signed)
Per nurse's note, called patient 12/02/16 - left voice message at home.  Also called back 12/05/16 to follow up.

## 2016-12-13 ENCOUNTER — Ambulatory Visit (INDEPENDENT_AMBULATORY_CARE_PROVIDER_SITE_OTHER): Payer: PPO | Admitting: Orthopedic Surgery

## 2016-12-13 ENCOUNTER — Encounter: Payer: Self-pay | Admitting: Orthopedic Surgery

## 2016-12-13 DIAGNOSIS — G8929 Other chronic pain: Secondary | ICD-10-CM

## 2016-12-13 DIAGNOSIS — M25562 Pain in left knee: Secondary | ICD-10-CM | POA: Diagnosis not present

## 2016-12-13 DIAGNOSIS — M1712 Unilateral primary osteoarthritis, left knee: Secondary | ICD-10-CM | POA: Diagnosis not present

## 2016-12-13 DIAGNOSIS — M25362 Other instability, left knee: Secondary | ICD-10-CM | POA: Diagnosis not present

## 2016-12-13 NOTE — Progress Notes (Signed)
FOLLOW UP   Chief Complaint  Patient presents with  . Follow-up    left knee    71 year old female with history of valve replacement of her heart presents with pain in her left knee which is noted with weightbearing relieved by rest associated with popping giving way but no major swelling   Review of Systems  Constitutional: Negative for fever.  Musculoskeletal:       Swollen left index finger for 2 weeks   Past Medical History:  Diagnosis Date  . Anxiety   . Aortic stenosis   . Arthritis   . Chronic systolic heart failure ([place])   . CKD (chronic kidney disease) stage 3, GFR 30-59 ml/min   . Constipation   . Depression   . Diabetes mellitus, type 2 ([place])   . Essential hypertension, benign   . Hyperlipidemia   . Incidental pulmonary nodule, > 102mm and < 82mm    7 x 4 mm LLL nodule  . LBBB (left bundle branch block)   . Nonischemic cardiomyopathy (HCC)    No significant obstructive CAD at heart catheterization 05/2014, LVEF 20%  . Obesity   . Pneumonia 2013  . 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 Symptomatic PVCs     Physical Exam  Constitutional: She is oriented to person, place, and time. She appears well-developed and well-nourished.  Musculoskeletal:  Gait is supported by a cane intermittently  Left knee/leg Medial joint line tenderness alignment normal Passive range of motion normal but painful Gross motor strength grade 5 extensor mechanism AP stability normal, collateral ligaments stable. No peripheral edema Normal sensation  Neurological: She is alert and oriented to person, place, and time.  Psychiatric: She has a normal mood and affect. Her behavior is normal. Judgment and thought content normal.   The last x-ray we saw on November 2017 showed severe medial and patellofemoral arthritis varus alignment of the left knee  Encounter  Diagnoses  Name Primary?  . Chronic pain of left knee Yes  . Primary osteoarthritis of left knee   . Other instability, left knee     Her medical condition is not consistent with having surgery, recommend bracing to alleviate her feelings of instability   Swollen left index finger: Recommend that she see her primary care physician and get appropriate referral based on her insurance

## 2016-12-14 ENCOUNTER — Encounter: Payer: Self-pay | Admitting: Family Medicine

## 2016-12-14 ENCOUNTER — Ambulatory Visit (INDEPENDENT_AMBULATORY_CARE_PROVIDER_SITE_OTHER): Payer: PPO | Admitting: Family Medicine

## 2016-12-14 VITALS — BP 130/80 | HR 76 | Temp 96.8°F | Resp 16 | Ht 65.0 in | Wt 186.0 lb

## 2016-12-14 DIAGNOSIS — I10 Essential (primary) hypertension: Secondary | ICD-10-CM | POA: Diagnosis not present

## 2016-12-14 DIAGNOSIS — M79645 Pain in left finger(s): Secondary | ICD-10-CM | POA: Diagnosis not present

## 2016-12-14 DIAGNOSIS — Z794 Long term (current) use of insulin: Secondary | ICD-10-CM | POA: Diagnosis not present

## 2016-12-14 DIAGNOSIS — IMO0001 Reserved for inherently not codable concepts without codable children: Secondary | ICD-10-CM

## 2016-12-14 DIAGNOSIS — E119 Type 2 diabetes mellitus without complications: Secondary | ICD-10-CM | POA: Diagnosis not present

## 2016-12-14 MED ORDER — PREDNISONE 5 MG PO TABS: ORAL_TABLET | ORAL | 0 refills | Status: DC

## 2016-12-14 NOTE — Progress Notes (Signed)
   [name] [name]     MRN: [id number]      DOB: [birth year]   HPI [name] [name] is here with a 1 week gh/o pain and swelling of left index finger, no known trauma and difficulty bending the finger  ROS Denies recent fever or chills. Denies sinus pressure, nasal congestion, ear pain or sore throat. Denies chest congestion, productive cough or wheezing. Denies chest pains, palpitations and leg swelling Denies abdominal pain, nausea, vomiting,diarrhea or constipation.   Denies dysuria, frequency, hesitancy or incontinence. . Denies skin break down or rash.   PE  BP 130/80 (BP Location: Left Arm, Patient Position: Sitting, Cuff Size: Normal)   Pulse 76   Temp (!) 96.8 F (36 C) (Temporal)   Resp 16   Ht 5\' 5"  (1.651 m)   Wt 186 lb (84.4 kg)   SpO2 99%   BMI 30.95 kg/m   Patient alert and oriented and in no cardiopulmonary distress.  HEENT: No facial asymmetry, EOMI,   oropharynx pink and moist.  Neck supple no JVD, no mass.  Chest: Clear to auscultation bilaterally.  CVS: S1, S2 no murmurs, no S3.Regular rate.  ABD: Soft non tender.   Ext: No edema  MS: Adequate ROM spine, shoulders, hips and knees.Left hand swollen at base of left index and left index swollen and tender with limitation in movement Skin: Intact, erythema of left index noted    Assessment & Plan  Pain in finger of left hand Short course of prednisone prescribed for painful swelling and erythema of left index, pt to call back in 2 days if unresolved  Essential hypertension, benign Controlled, no change in medication   Diabetes mellitus, insulin dependent (IDDM), controlled (HCC) Controlled, no change in medication [name] [name] is reminded of the importance of commitment to daily physical activity for 30 minutes or more, as able and the need to limit carbohydrate intake to 30 to 60 grams per meal to help with blood sugar control.   The need to take medication as prescribed, test blood sugar as  directed, and to call between visits if there is a concern that blood sugar is uncontrolled is also discussed.   [name] [name] is reminded of the importance of daily foot exam, annual eye examination, and good blood sugar, blood pressure and cholesterol control.  Diabetic Labs Latest Ref Rng & Units 09/27/2016 06/22/2016 03/29/2016 03/25/2016 01/01/2016  HbA1c <5.7 % 7.6(H) 7.3(H) - 9.0(H) -  Microalbumin Not Estab. ug/mL - - <3.0(H) - -  Micro/Creat Ratio 0.0 - 30.0 mg/g creat - - <10.5 - -  Chol <200 mg/dL 194 194 - 226(H) -  HDL >50 mg/dL 56 60 - 57 -  Calc LDL <100 mg/dL 119(H) 121(H) - 149(H) -  Triglycerides <150 mg/dL 97 66 - 101 -  Creatinine 0.60 - 0.93 mg/dL 2.14(H) 1.46(H) - 1.75(H) 1.86(H)   BP/Weight 12/14/2016 09/29/2016 09/29/2016 09/26/2016 06/23/2016 05/17/2016 3/64/6803  Systolic BP 212 248 250 037 048 889 169  Diastolic BP 80 64 64 68 70 61 80  Wt. (Lbs) 186 189.04 189.6 186 186 188 186.8  BMI 30.95 31.46 31.55 30.95 30.95 31.28 30.15   Foot/eye exam completion dates Latest Ref Rng & Units 05/31/2016 03/29/2016  Eye Exam No Retinopathy No Retinopathy -  Foot exam Order - - -  Foot Form Completion - - Done

## 2016-12-14 NOTE — Patient Instructions (Signed)
f/u as before, call in 2 days if not better I will refer you to Dr Aline Brochure  I have sent in prednisone for you, needto take this for your swollen left index finger for a few days  Thank you  for choosing [facility]. We consider it a privelige to serve you.  Delivering excellent health care in a caring and  compassionate way is our goal.  Partnering with you,  so that together we can achieve this goal is our strategy.

## 2016-12-17 ENCOUNTER — Encounter: Payer: Self-pay | Admitting: Family Medicine

## 2016-12-17 NOTE — Assessment & Plan Note (Signed)
Short course of prednisone prescribed for painful swelling and erythema of left index, pt to call back in 2 days if unresolved

## 2016-12-17 NOTE — Assessment & Plan Note (Signed)
Controlled, no change in medication  

## 2016-12-17 NOTE — Assessment & Plan Note (Signed)
Controlled, no change in medication [name] [name] is reminded of the importance of commitment to daily physical activity for 30 minutes or more, as able and the need to limit carbohydrate intake to 30 to 60 grams per meal to help with blood sugar control.   The need to take medication as prescribed, test blood sugar as directed, and to call between visits if there is a concern that blood sugar is uncontrolled is also discussed.   [name] [name] is reminded of the importance of daily foot exam, annual eye examination, and good blood sugar, blood pressure and cholesterol control.  Diabetic Labs Latest Ref Rng & Units 09/27/2016 06/22/2016 03/29/2016 03/25/2016 01/01/2016  HbA1c <5.7 % 7.6(H) 7.3(H) - 9.0(H) -  Microalbumin Not Estab. ug/mL - - <3.0(H) - -  Micro/Creat Ratio 0.0 - 30.0 mg/g creat - - <10.5 - -  Chol <200 mg/dL 194 194 - 226(H) -  HDL >50 mg/dL 56 60 - 57 -  Calc LDL <100 mg/dL 119(H) 121(H) - 149(H) -  Triglycerides <150 mg/dL 97 66 - 101 -  Creatinine 0.60 - 0.93 mg/dL 2.14(H) 1.46(H) - 1.75(H) 1.86(H)   BP/Weight 12/14/2016 09/29/2016 09/29/2016 09/26/2016 06/23/2016 05/17/2016 6/96/2952  Systolic BP 841 324 401 027 253 664 403  Diastolic BP 80 64 64 68 70 61 80  Wt. (Lbs) 186 189.04 189.6 186 186 188 186.8  BMI 30.95 31.46 31.55 30.95 30.95 31.28 30.15   Foot/eye exam completion dates Latest Ref Rng & Units 05/31/2016 03/29/2016  Eye Exam No Retinopathy No Retinopathy -  Foot exam Order - - -  Foot Form Completion - - Done

## 2016-12-20 ENCOUNTER — Telehealth: Payer: Self-pay | Admitting: Family Medicine

## 2016-12-20 NOTE — Telephone Encounter (Signed)
Patient is requesting documentation from Dr. Moshe Cipro to provide assistance thru social services since she is a diabetic

## 2016-12-23 NOTE — Telephone Encounter (Signed)
Letter mailed to patient.

## 2017-01-10 ENCOUNTER — Other Ambulatory Visit: Payer: Self-pay | Admitting: Family Medicine

## 2017-01-10 DIAGNOSIS — Z1231 Encounter for screening mammogram for malignant neoplasm of breast: Secondary | ICD-10-CM

## 2017-01-13 ENCOUNTER — Other Ambulatory Visit: Payer: Self-pay | Admitting: Family Medicine

## 2017-01-18 ENCOUNTER — Ambulatory Visit (HOSPITAL_COMMUNITY): Payer: PPO

## 2017-01-19 ENCOUNTER — Other Ambulatory Visit: Payer: Self-pay | Admitting: Family Medicine

## 2017-01-26 ENCOUNTER — Other Ambulatory Visit: Payer: Self-pay | Admitting: Family Medicine

## 2017-01-31 ENCOUNTER — Encounter: Payer: Self-pay | Admitting: Orthopedic Surgery

## 2017-01-31 NOTE — Progress Notes (Signed)
ADDENDUM  SHE HAS LARGE THIGH CALF RATIO  HER KNEE GIVES WAY AND IS UNSTABLE

## 2017-02-01 ENCOUNTER — Encounter: Payer: Self-pay | Admitting: Cardiology

## 2017-02-03 ENCOUNTER — Telehealth: Payer: Self-pay | Admitting: Orthopedic Surgery

## 2017-02-03 NOTE — Telephone Encounter (Signed)
Pt called asking what is going on about her brace. She stated that she hasn't heard anything about it and was wanting to speak with you.  Please advise

## 2017-02-06 ENCOUNTER — Other Ambulatory Visit: Payer: Self-pay | Admitting: Family Medicine

## 2017-02-06 DIAGNOSIS — Z794 Long term (current) use of insulin: Secondary | ICD-10-CM | POA: Diagnosis not present

## 2017-02-06 DIAGNOSIS — I10 Essential (primary) hypertension: Secondary | ICD-10-CM | POA: Diagnosis not present

## 2017-02-06 DIAGNOSIS — E119 Type 2 diabetes mellitus without complications: Secondary | ICD-10-CM | POA: Diagnosis not present

## 2017-02-06 LAB — BASIC METABOLIC PANEL WITH GFR
BUN: 36 mg/dL — ABNORMAL HIGH (ref 7–25)
CO2: 22 mmol/L (ref 20–31)
Calcium: 9.7 mg/dL (ref 8.6–10.4)
Chloride: 105 mmol/L (ref 98–110)
Creat: 1.88 mg/dL — ABNORMAL HIGH (ref 0.60–0.93)
GFR, Est African American: 31 mL/min — ABNORMAL LOW (ref >=60)
GFR, Est Non African American: 26 mL/min — ABNORMAL LOW (ref >=60)
Glucose, Bld: 176 mg/dL — ABNORMAL HIGH (ref 65–99)
Potassium: 4.2 mmol/L (ref 3.5–5.3)
Sodium: 140 mmol/L (ref 135–146)

## 2017-02-06 NOTE — Telephone Encounter (Signed)
Call matt

## 2017-02-06 NOTE — Telephone Encounter (Signed)
MATT HAS INFORMATION AND WILL CONTACT PATIENT

## 2017-02-06 NOTE — Telephone Encounter (Signed)
Routing to Dr Crisci 

## 2017-02-07 LAB — HEMOGLOBIN A1C
Hgb A1c MFr Bld: 7.5 % — ABNORMAL HIGH (ref <5.7)
Mean Plasma Glucose: 169 mg/dL

## 2017-02-08 ENCOUNTER — Encounter (INDEPENDENT_AMBULATORY_CARE_PROVIDER_SITE_OTHER): Payer: Self-pay | Admitting: *Deleted

## 2017-02-08 ENCOUNTER — Encounter: Payer: Self-pay | Admitting: Family Medicine

## 2017-02-08 ENCOUNTER — Ambulatory Visit (INDEPENDENT_AMBULATORY_CARE_PROVIDER_SITE_OTHER): Payer: PPO | Admitting: Family Medicine

## 2017-02-08 VITALS — BP 122/78 | HR 82 | Temp 97.0°F | Resp 16 | Ht 65.0 in | Wt 184.1 lb

## 2017-02-08 DIAGNOSIS — Z1211 Encounter for screening for malignant neoplasm of colon: Secondary | ICD-10-CM | POA: Diagnosis not present

## 2017-02-08 DIAGNOSIS — E119 Type 2 diabetes mellitus without complications: Secondary | ICD-10-CM | POA: Diagnosis not present

## 2017-02-08 DIAGNOSIS — I5042 Chronic combined systolic (congestive) and diastolic (congestive) heart failure: Secondary | ICD-10-CM

## 2017-02-08 DIAGNOSIS — I10 Essential (primary) hypertension: Secondary | ICD-10-CM

## 2017-02-08 DIAGNOSIS — Z1239 Encounter for other screening for malignant neoplasm of breast: Secondary | ICD-10-CM

## 2017-02-08 DIAGNOSIS — Z794 Long term (current) use of insulin: Secondary | ICD-10-CM

## 2017-02-08 DIAGNOSIS — E785 Hyperlipidemia, unspecified: Secondary | ICD-10-CM | POA: Diagnosis not present

## 2017-02-08 DIAGNOSIS — Z1231 Encounter for screening mammogram for malignant neoplasm of breast: Secondary | ICD-10-CM | POA: Diagnosis not present

## 2017-02-08 DIAGNOSIS — IMO0001 Reserved for inherently not codable concepts without codable children: Secondary | ICD-10-CM

## 2017-02-08 NOTE — Patient Instructions (Addendum)
F/u in Novemebr, call if you need me before  Excellent blood sugar and blood pressure   you need to get mammogram appt at checkout please  Please get your colonscopy in the Fall,i have referred you to Dr Laural Golden  Enjoy your family and the beach  It is important that you exercise regularly at least 30 minutes 5 times a week. If you develop chest pain, have severe difficulty breathing, or feel very tired, stop exercising immediately and seek medical attention   Please work on good  health habits so that your health will improve. 1. Commitment to daily physical activity for 30 to 60  minutes, if you are able to do this.  2. Commitment to wise food choices. Aim for half of your  food intake to be vegetable and fruit, one quarter starchy foods, and one quarter protein. Try to eat on a regular schedule  3 meals per day, snacking between meals should be limited to vegetables or fruits or small portions of nuts. 64 ounces of water per day is generally recommended, unless you have specific health conditions, like heart failure or kidney failure where you will need to limit fluid intake.  3. Commitment to sufficient and a  good quality of physical and mental rest daily, generally between 6 to 8 hours per day.  WITH PERSISTANCE AND PERSEVERANCE, THE IMPOSSIBLE , BECOMES THE NORM!

## 2017-02-11 LAB — LIPID PANEL
Cholesterol: 157 mg/dL (ref <200)
HDL: 56 mg/dL (ref >50)
LDL Cholesterol: 80 mg/dL (ref <100)
Total CHOL/HDL Ratio: 2.8 Ratio (ref <5.0)
Triglycerides: 103 mg/dL (ref <150)
VLDL: 21 mg/dL (ref <30)

## 2017-02-12 ENCOUNTER — Encounter: Payer: Self-pay | Admitting: Family Medicine

## 2017-02-12 NOTE — Assessment & Plan Note (Signed)
Hyperlipidemia:Low fat diet discussed and encouraged.   Lipid Panel  Lab Results  Component Value Date   CHOL 157 02/06/2017   HDL 56 02/06/2017   LDLCALC 80 02/06/2017   TRIG 103 02/06/2017   CHOLHDL 2.8 02/06/2017   Controlled, no change in medication

## 2017-02-12 NOTE — Assessment & Plan Note (Signed)
Controlled, no change in medication [name] [name] is reminded of the importance of commitment to daily physical activity for 30 minutes or more, as able and the need to limit carbohydrate intake to 30 to 60 grams per meal to help with blood sugar control.   The need to take medication as prescribed, test blood sugar as directed, and to call between visits if there is a concern that blood sugar is uncontrolled is also discussed.   [name] [name] is reminded of the importance of daily foot exam, annual eye examination, and good blood sugar, blood pressure and cholesterol control.  Diabetic Labs Latest Ref Rng & Units 02/06/2017 09/27/2016 06/22/2016 03/29/2016 03/25/2016  HbA1c <5.7 % 7.5(H) 7.6(H) 7.3(H) - 9.0(H)  Microalbumin Not Estab. ug/mL - - - <3.0(H) -  Micro/Creat Ratio 0.0 - 30.0 mg/g creat - - - <10.5 -  Chol <200 mg/dL 157 194 194 - 226(H)  HDL >50 mg/dL 56 56 60 - 57  Calc LDL <100 mg/dL 80 119(H) 121(H) - 149(H)  Triglycerides <150 mg/dL 103 97 66 - 101  Creatinine 0.60 - 0.93 mg/dL 1.88(H) 2.14(H) 1.46(H) - 1.75(H)   BP/Weight 02/08/2017 12/14/2016 09/29/2016 09/29/2016 09/26/2016 06/23/2016 67/09/917  Systolic BP 802 217 981 025 486 282 417  Diastolic BP 78 80 64 64 68 70 61  Wt. (Lbs) 184.08 186 189.04 189.6 186 186 188  BMI 30.63 30.95 31.46 31.55 30.95 30.95 31.28   Foot/eye exam completion dates Latest Ref Rng & Units 05/31/2016 03/29/2016  Eye Exam No Retinopathy No Retinopathy -  Foot exam Order - - -  Foot Form Completion - - Done

## 2017-02-12 NOTE — Progress Notes (Signed)
[name] [name]     MRN: [id number]      DOB: June 17, 1946   HPI [name] [name] is here for follow up and re-evaluation of chronic medical conditions, medication management and review of any available recent lab and radiology data.  Preventive health is updated, specifically  Cancer screening and Immunization.   Questions or concerns regarding consultations or procedures which the PT has had in the interim are  addressed. The PT denies any adverse reactions to current medications since the last visit.  There are no new concerns.  There are no specific complaints  Denies polyuria, polydipsia, blurred vision , or hypoglycemic episodes.   ROS Denies recent fever or chills. Denies sinus pressure, nasal congestion, ear pain or sore throat. Denies chest congestion, productive cough or wheezing. Denies chest pains, palpitations and leg swelling Denies abdominal pain, nausea, vomiting,diarrhea or constipation.   Denies dysuria, frequency, hesitancy or incontinence. Denies uncontrolled  joint pain, swelling and limitation in mobility. Denies headaches, seizures, numbness, or tingling. Denies depression, anxiety or insomnia. Denies skin break down or rash.   PE  BP 122/78 (BP Location: Left Arm, Patient Position: Sitting, Cuff Size: Normal)   Pulse 82   Temp 97 F (36.1 C) (Temporal)   Resp 16   Ht 5\' 5"  (1.651 m)   Wt 184 lb 1.3 oz (83.5 kg)   SpO2 98%   BMI 30.63 kg/m   Patient alert and oriented and in no cardiopulmonary distress.  HEENT: No facial asymmetry, EOMI,   oropharynx pink and moist.  Neck supple no JVD, no mass.  Chest: Clear to auscultation bilaterally.  CVS: S1, S2 systolic murmurs, no S3.Regular rate.  ABD: Soft non tender.   Ext: No edema  MS: Adequate though reduced ROM spine, shoulders, hips and knees.  Skin: Intact, no ulcerations or rash noted.  Psych: Good eye contact, normal affect. Memory intact not anxious or depressed appearing.  CNS: CN 2-12  intact, power,  normal throughout.no focal deficits noted.   Assessment & Plan  Essential hypertension, benign Controlled, no change in medication DASH diet and commitment to daily physical activity for a minimum of 30 minutes discussed and encouraged, as a part of hypertension management. The importance of attaining a healthy weight is also discussed.  BP/Weight 02/08/2017 12/14/2016 09/29/2016 09/29/2016 09/26/2016 06/23/2016 89/10/7340  Systolic BP 876 811 572 620 355 974 163  Diastolic BP 78 80 64 64 68 70 61  Wt. (Lbs) 184.08 186 189.04 189.6 186 186 188  BMI 30.63 30.95 31.46 31.55 30.95 30.95 31.28       Chronic combined systolic and diastolic CHF (congestive heart failure) (HCC) Asymptomatic and stable, followed by cardiology with appt next month  Hyperlipidemia LDL goal <100 Hyperlipidemia:Low fat diet discussed and encouraged.   Lipid Panel  Lab Results  Component Value Date   CHOL 157 02/06/2017   HDL 56 02/06/2017   LDLCALC 80 02/06/2017   TRIG 103 02/06/2017   CHOLHDL 2.8 02/06/2017   Controlled, no change in medication     Diabetes mellitus, insulin dependent (IDDM), controlled (HCC) Controlled, no change in medication [name] [name] is reminded of the importance of commitment to daily physical activity for 30 minutes or more, as able and the need to limit carbohydrate intake to 30 to 60 grams per meal to help with blood sugar control.   The need to take medication as prescribed, test blood sugar as directed, and to call between visits if there is a concern that blood  sugar is uncontrolled is also discussed.   [name] [name] is reminded of the importance of daily foot exam, annual eye examination, and good blood sugar, blood pressure and cholesterol control.  Diabetic Labs Latest Ref Rng & Units 02/06/2017 09/27/2016 06/22/2016 03/29/2016 03/25/2016  HbA1c <5.7 % 7.5(H) 7.6(H) 7.3(H) - 9.0(H)  Microalbumin Not Estab. ug/mL - - - <3.0(H) -  Micro/Creat Ratio 0.0 - 30.0  mg/g creat - - - <10.5 -  Chol <200 mg/dL 157 194 194 - 226(H)  HDL >50 mg/dL 56 56 60 - 57  Calc LDL <100 mg/dL 80 119(H) 121(H) - 149(H)  Triglycerides <150 mg/dL 103 97 66 - 101  Creatinine 0.60 - 0.93 mg/dL 1.88(H) 2.14(H) 1.46(H) - 1.75(H)   BP/Weight 02/08/2017 12/14/2016 09/29/2016 09/29/2016 09/26/2016 06/23/2016 41/01/6062  Systolic BP 016 010 932 355 732 202 542  Diastolic BP 78 80 64 64 68 70 61  Wt. (Lbs) 184.08 186 189.04 189.6 186 186 188  BMI 30.63 30.95 31.46 31.55 30.95 30.95 31.28   Foot/eye exam completion dates Latest Ref Rng & Units 05/31/2016 03/29/2016  Eye Exam No Retinopathy No Retinopathy -  Foot exam Order - - -  Foot Form Completion - - Done        Obesity Improved Patient re-educated about  the importance of commitment to a  minimum of 150 minutes of exercise per week.  The importance of healthy food choices with portion control discussed. Encouraged to start a food diary, count calories and to consider  joining a support group. Sample diet sheets offered. Goals set by the patient for the next several months.   Weight /BMI 02/08/2017 12/14/2016 09/29/2016  WEIGHT 184 lb 1.3 oz 186 lb 189 lb 0.6 oz  HEIGHT 5\' 5"  5\' 5"  5\' 5"   BMI 30.63 kg/m2 30.95 kg/m2 31.46 kg/m2

## 2017-02-12 NOTE — Assessment & Plan Note (Addendum)
Improved Patient re-educated about  the importance of commitment to a  minimum of 150 minutes of exercise per week.  The importance of healthy food choices with portion control discussed. Encouraged to start a food diary, count calories and to consider  joining a support group. Sample diet sheets offered. Goals set by the patient for the next several months.   Weight /BMI 02/08/2017 12/14/2016 09/29/2016  WEIGHT 184 lb 1.3 oz 186 lb 189 lb 0.6 oz  HEIGHT 5\' 5"  5\' 5"  5\' 5"   BMI 30.63 kg/m2 30.95 kg/m2 31.46 kg/m2

## 2017-02-12 NOTE — Assessment & Plan Note (Signed)
Asymptomatic and stable, followed by cardiology with appt next month

## 2017-02-12 NOTE — Assessment & Plan Note (Signed)
Controlled, no change in medication DASH diet and commitment to daily physical activity for a minimum of 30 minutes discussed and encouraged, as a part of hypertension management. The importance of attaining a healthy weight is also discussed.  BP/Weight 02/08/2017 12/14/2016 09/29/2016 09/29/2016 09/26/2016 06/23/2016 11/13/3641  Systolic BP 837 793 968 864 847 207 218  Diastolic BP 78 80 64 64 68 70 61  Wt. (Lbs) 184.08 186 189.04 189.6 186 186 188  BMI 30.63 30.95 31.46 31.55 30.95 30.95 31.28

## 2017-02-17 ENCOUNTER — Other Ambulatory Visit: Payer: Self-pay | Admitting: Family Medicine

## 2017-02-17 ENCOUNTER — Ambulatory Visit (HOSPITAL_COMMUNITY): Payer: PPO

## 2017-02-17 DIAGNOSIS — E785 Hyperlipidemia, unspecified: Secondary | ICD-10-CM

## 2017-03-09 ENCOUNTER — Telehealth: Payer: Self-pay | Admitting: Orthopedic Surgery

## 2017-03-09 NOTE — Telephone Encounter (Signed)
[name] [name] called and wanted to know what was going on with her brace.  She said she came in and was fitted for a brace but has not gotten it as of yet.  I told her that I would call at the representative and see if I could find out what is going on .  I called Matt and had to leave a message.

## 2017-03-12 ENCOUNTER — Emergency Department (HOSPITAL_COMMUNITY): Payer: PPO

## 2017-03-12 ENCOUNTER — Emergency Department (HOSPITAL_COMMUNITY)
Admission: EM | Admit: 2017-03-12 | Discharge: 2017-03-12 | Disposition: A | Discharge status: Home / Self Care | Payer: PPO | Attending: Emergency Medicine | Admitting: Emergency Medicine

## 2017-03-12 ENCOUNTER — Encounter (HOSPITAL_COMMUNITY): Payer: Self-pay | Admitting: Emergency Medicine

## 2017-03-12 DIAGNOSIS — M25462 Effusion, left knee: Secondary | ICD-10-CM | POA: Diagnosis not present

## 2017-03-12 DIAGNOSIS — M25562 Pain in left knee: Secondary | ICD-10-CM

## 2017-03-12 DIAGNOSIS — E119 Type 2 diabetes mellitus without complications: Secondary | ICD-10-CM | POA: Insufficient documentation

## 2017-03-12 DIAGNOSIS — I129 Hypertensive chronic kidney disease with stage 1 through stage 4 chronic kidney disease, or unspecified chronic kidney disease: Secondary | ICD-10-CM | POA: Insufficient documentation

## 2017-03-12 DIAGNOSIS — M1712 Unilateral primary osteoarthritis, left knee: Secondary | ICD-10-CM | POA: Diagnosis not present

## 2017-03-12 DIAGNOSIS — Z79899 Other long term (current) drug therapy: Secondary | ICD-10-CM | POA: Diagnosis not present

## 2017-03-12 DIAGNOSIS — N183 Chronic kidney disease, stage 3 (moderate): Secondary | ICD-10-CM | POA: Diagnosis not present

## 2017-03-12 MED ORDER — TRAMADOL HCL 50 MG PO TABS: 50.0000 mg | ORAL_TABLET | Freq: Four times a day (QID) | ORAL | 0 refills | Status: DC | PRN

## 2017-03-12 MED ORDER — TRAMADOL HCL 50 MG PO TABS
50.0000 mg | ORAL_TABLET | Freq: Once | ORAL | Status: AC
  Administered 2017-03-12: 50 mg via ORAL
  Filled 2017-03-12: qty 1

## 2017-03-12 NOTE — ED Provider Notes (Signed)
[facility] DEPT Provider Note   CSN: [id number] Arrival date & time: 03/12/17  1242   By signing my name below, I, Eunice Blase, attest that this documentation has been prepared under the direction and in the presence of Malvin Johns, MD. Electronically signed, Eunice Blase, ED Scribe. 03/12/17. 1:29 PM.  History   Chief Complaint Chief Complaint  Patient presents with  . Knee Pain   The history is provided by the patient and medical records. No language interpreter was used.    [name] [name] is a 71 y.o. female presenting to the Emergency Department concerning acute on chronic L knee pain x 1 week. She states the pain became severe x 2 days. Associated swelling this morning. She describes 10/10, constant aches worse with ambulation and application of pressure. She states she can't walk d/t pain. Ice applied and tylenol taken at home with incomplete relief to pain and swelling. H/o less severe swelling with past incidences of acute on chronic knee pain reported. Pt followed by Dr. Aline Brochure for chronic L knee pain. Pt alleges she is not a candidate for knee replacement because of aortic stenosis Hx. No recent trauma/ injury. No hip pain, fever, N/V or any other complaints noted at this time.   Past Medical History:  Diagnosis Date  . Anxiety   . Aortic stenosis   . Arthritis   . Chronic systolic heart failure ([place])   . CKD (chronic kidney disease) stage 3, GFR 30-59 ml/min   . Constipation   . Depression   . Diabetes mellitus, type 2 ([place])   . Essential hypertension, benign   . Hyperlipidemia   . Incidental pulmonary nodule, > 5mm and < 29mm    7 x 4 mm LLL nodule  . LBBB (left bundle branch block)   . Nonischemic cardiomyopathy (HCC)    No significant obstructive CAD at heart catheterization 05/2014, LVEF 20%  . Obesity   . Pneumonia 2013  . 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 Symptomatic PVCs     Patient Active Problem List   Diagnosis Date Noted  . Pain in finger of left hand 12/14/2016  . 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 Severe aortic valve stenosis 03/24/2015  . Hyperlipidemia LDL goal <100 05/24/2014  . Severe aortic stenosis 05/23/2014  . Incidental pulmonary nodule, > 73mm and < 13mm 02/10/2014  . Chronic combined systolic and diastolic CHF (congestive heart failure) ([place])   . LBBB (left bundle branch block) 12/03/2013  . CKD (chronic kidney disease) stage 3, GFR 30-59 ml/min 05/21/2013  . Seasonal allergies 02/22/2012  . Essential hypertension, benign   . Nonischemic cardiomyopathy ([place]) 10/14/2011  . Diabetes mellitus, insulin dependent (IDDM), controlled ([place]) 10/24/2007  . Obesity 10/24/2007    Past Surgical History:  Procedure Laterality Date  . CARDIAC CATHETERIZATION    . CESAREAN SECTION  1987  . FLEXIBLE BRONCHOSCOPY N/A 01/14/2015   Procedure: FLEXIBLE BRONCHOSCOPY;  Surgeon: [name], MD;  Location: [facility];  Service: Thoracic;  Laterality: N/A;  . LEFT AND RIGHT HEART CATHETERIZATION WITH CORONARY ANGIOGRAM N/A 12/05/2011   Procedure: LEFT AND RIGHT HEART CATHETERIZATION WITH CORONARY ANGIOGRAM;  Surgeon: [name], MD;  Location: [facility] CATH LAB;  Service: Cardiovascular;  Laterality: N/A;  . LEFT AND RIGHT HEART CATHETERIZATION WITH CORONARY ANGIOGRAM N/A 05/23/2014   Procedure: LEFT AND RIGHT HEART CATHETERIZATION WITH CORONARY  ANGIOGRAM;  Surgeon: [name], MD;  Location: [facility] CATH LAB;  Service: Cardiovascular;  Laterality: N/A;  . MULTIPLE EXTRACTIONS WITH ALVEOLOPLASTY N/A 02/27/2014   Procedure: Extraction of tooth #'s 2,4,5,6,7,8,9,10,11,12, 22, 23, 24, 25, 26, 28 with alveoloplasty and bilateral mandibular tori reductions;  Surgeon: [name], DDS;  Location: [facility];  Service: Oral Surgery;  Laterality: N/A;  . RIGID BRONCHOSCOPY N/A  01/14/2015   Procedure: RIGID BRONCHOSCOPY with Removal Of Foreign Body ;  Surgeon: [name], MD;  Location: [place];  Service: Thoracic;  Laterality: N/A;  . TEE WITHOUT CARDIOVERSION N/A 03/24/2015   Procedure: TRANSESOPHAGEAL ECHOCARDIOGRAM (TEE);  Surgeon: [name], MD;  Location: [place];  Service: Open Heart Surgery;  Laterality: N/A;  . TRANSCATHETER AORTIC VALVE REPLACEMENT, TRANSFEMORAL Bilateral 03/24/2015   Procedure: TRANSCATHETER AORTIC VALVE REPLACEMENT, TRANSFEMORAL;  Surgeon: [name], MD;  Location: [facility];  Service: Open Heart Surgery;  Laterality: Bilateral;  . TUBAL LIGATION  1987  . VIDEO BRONCHOSCOPY Bilateral 06/05/2014   Procedure: VIDEO BRONCHOSCOPY WITHOUT FLUORO;  Surgeon: [name], MD;  Location[facility];  Service: Cardiopulmonary;  Laterality: Bilateral;    OB History    No data available       Home Medications    Prior to Admission medications   Medication Sig Start Date End Date Taking? Authorizing Provider  acetaminophen (TYLENOL) 500 MG tablet Take 1,000 mg by mouth every 6 (six) hours as needed for moderate pain.    [provider]  aspirin EC 81 MG tablet Take 81 mg by mouth daily.    [provider]  carvedilol (COREG) 25 MG tablet TAKE 1 TABLET BY MOUTH TWICE DAILY WITH MEALS. 11/14/16   [name], MD  ezetimibe (ZETIA) 10 MG tablet TAKE ONE TABLET BY MOUTH ONCE DAILY. 10/18/16   Fayrene Helper, MD  fenofibrate (TRICOR) 145 MG tablet TAKE ONE TABLET BY MOUTH ONCE DAILY. 02/20/17   Fayrene Helper, MD  furosemide (LASIX) 40 MG tablet TAKE 1 TABLET BY MOUTH EVERY MORNING. 10/27/16   Fayrene Helper, MD  GLIPIZIDE XL 5 MG 24 hr tablet TAKE 1 TABLET BY MOUTH DAILY WITH BREAKFAST. 11/30/16   Fayrene Helper, MD  Lancets MISC Three times daily testing dx   e10..65 for easymax meter 04/21/16   Fayrene Helper, MD  LIVALO 2 MG TABS TAKE ONE TABLET BY MOUTH ONCE DAILY AFTER  SUPPER. 02/20/17   Fayrene Helper, MD  Multiple Vitamins-Minerals (MULTIVITAMIN PO) Take 1 tablet by mouth daily.    [provider]  niacin (NIASPAN) 500 MG CR tablet TAKE (1) TABLET BY MOUTH AT BEDTIME. 02/20/17   Fayrene Helper, MD  NOVOLOG MIX 70/30 FLEXPEN (70-30) 100 UNIT/ML FlexPen INJECT 12 UNITS SUBCUTANEOUSLY TWICE DAILY. 01/16/17   Fayrene Helper, MD  ONE TOUCH ULTRA TEST test strip CHECK BLOOD SUGAR 3 TIMES A DAY. 11/30/16   Fayrene Helper, MD  potassium chloride (K-DUR) 10 MEQ tablet TAKE ONE TABLET BY MOUTH DAILY. DO NOT TAKE IF NOT TAKING FUROSEMIDE. 01/26/17   Fayrene Helper, MD  spironolactone (ALDACTONE) 25 MG tablet TAKE 1 TABLET BY MOUTH ONCE DAILY. 01/26/17   Fayrene Helper, MD  traMADol (ULTRAM) 50 MG tablet Take 1 tablet (50 mg total) by mouth every 6 (six) hours as needed. 03/12/17   Malvin Johns, MD  UNIFINE PENTIPS 31G X 8 MM MISC USE AS NEEDED TO INJECT INSULIN. 11/17/16   Moshe Cipro,  Norwood Levo, MD    Family History Family History  Problem Relation Age of Onset  . Heart disease Mother   . Heart attack Mother 28  . Diabetes Mother   . Hypertension Mother   . Colon cancer Father 57  . Breast cancer Sister   . Diabetes Brother   . Hypertension Brother   . Stroke Brother   . Cancer Brother   . Congestive Heart Failure Daughter   . Hypertension Son   . High Cholesterol Son   . Congestive Heart Failure Son     Social History Social History  Substance Use Topics  . Smoking status: Never Smoker  . Smokeless tobacco: Never Used  . Alcohol use No     Allergies   Crestor [rosuvastatin] and Penicillins   Review of Systems Review of Systems  Constitutional: Negative for chills, diaphoresis and fever.  Respiratory: Negative for shortness of breath.   Cardiovascular: Negative for leg swelling.  Gastrointestinal: Negative for nausea and vomiting.  Musculoskeletal: Positive for arthralgias, gait problem and joint swelling.  Skin:  Negative for color change and wound.  Neurological: Negative for weakness and numbness.     Physical Exam Updated Vital Signs BP 133/73 (BP Location: Right Arm)   Pulse 80   Temp 98.3 F (36.8 C) (Oral)   Resp 16   Ht 5\' 6"  (1.676 m)   Wt 180 lb (81.6 kg)   SpO2 98%   BMI 29.05 kg/m   Physical Exam  Constitutional: She is oriented to person, place, and time. She appears well-developed and well-nourished.  HENT:  Head: Normocephalic and atraumatic.  Neck: Normal range of motion. Neck supple.  Cardiovascular: Normal rate.   Pulmonary/Chest: Effort normal.  Musculoskeletal: She exhibits tenderness.  Generalized TTP and tenderness on ROM of L knee, mild effusion present. Mild warmth; no erythema. No ligament laxity. NVI distally.  Neurological: She is alert and oriented to person, place, and time.  Skin: Skin is warm and dry.  Psychiatric: She has a normal mood and affect.     ED Treatments / Results  DIAGNOSTIC STUDIES: Oxygen Saturation is 98% on RA, NL by my interpretation.    COORDINATION OF CARE: 1:28 PM-Discussed next steps with pt. Pt verbalized understanding and is agreeable with the plan. Will order imaging and medication.   Labs (all labs ordered are listed, but only abnormal results are displayed) Labs Reviewed - No data to display  EKG  EKG Interpretation None       Radiology Dg Knee Complete 4 Views Left  Result Date: 03/12/2017 CLINICAL DATA:  Patient with left knee pain.  No known injury. EXAM: LEFT KNEE - COMPLETE 4+ VIEW COMPARISON:  Radiograph 06/21/2016 FINDINGS: Tricompartmental osteophytosis. Medial compartment joint space narrowing with subchondral sclerosis. No evidence for acute fracture or dislocation. Moderate to large joint effusion. IMPRESSION: Tricompartmental osteoarthritis with medial compartment joint space narrowing and subchondral sclerosis. Moderate to large joint effusion. Electronically Signed   By: Lovey Newcomer M.D.   On:  03/12/2017 14:20    Procedures Procedures (including critical care time)  Medications Ordered in ED Medications  traMADol (ULTRAM) tablet 50 mg (50 mg Oral Given 03/12/17 1418)     Initial Impression / Assessment and Plan / ED Course  I have reviewed the triage vital signs and the nursing notes.  Pertinent labs & imaging results that were available during my care of the patient were reviewed by me and considered in my medical decision making (see chart for details).  X-ray shows significant arthritis and moderate effusion.  The fusion is likely related to her arthritis. She's had similar symptoms in the past. There is no other suggestions of septic joint. She was placed in a knee sleeve. She was advised in resting and icing the joint. She was given a prescription for tramadol. She is going to call her orthopedist tomorrow for follow-up. Return precautions were given.  Final Clinical Impressions(s) / ED Diagnoses   Final diagnoses:  Effusion of left knee  Acute pain of left knee    New Prescriptions New Prescriptions   TRAMADOL (ULTRAM) 50 MG TABLET    Take 1 tablet (50 mg total) by mouth every 6 (six) hours as needed.  I personally performed the services described in this documentation, which was scribed in my presence.  The recorded information has been reviewed and considered.    Malvin Johns, MD 03/12/17 [phone]

## 2017-03-12 NOTE — ED Triage Notes (Signed)
PT reports hx of left knee pain, was denied to have surgery due to heart history. Pt reports pain got worse yesterday. She got fitted for a knee brace over two months ago at [facility]'s office but they have not called to say it is ready yet.

## 2017-03-12 NOTE — ED Notes (Signed)
Pt taken to Xray.

## 2017-03-14 NOTE — Telephone Encounter (Signed)
Patient called, states had been to Emergency Room at [facility] for increased pain and swelling in left knee. States has still not received the brace that she was fitted with by [employer] rep, Shannan Harper.  I will follow up with representative by email in meantime.  Patient verified her phone # [phone]

## 2017-03-15 ENCOUNTER — Encounter: Payer: Self-pay | Admitting: Family Medicine

## 2017-03-15 ENCOUNTER — Ambulatory Visit (INDEPENDENT_AMBULATORY_CARE_PROVIDER_SITE_OTHER): Payer: PPO | Admitting: Family Medicine

## 2017-03-15 VITALS — BP 124/82 | HR 73 | Resp 16 | Ht 65.0 in | Wt 184.0 lb

## 2017-03-15 DIAGNOSIS — Z09 Encounter for follow-up examination after completed treatment for conditions other than malignant neoplasm: Secondary | ICD-10-CM | POA: Diagnosis not present

## 2017-03-15 DIAGNOSIS — IMO0001 Reserved for inherently not codable concepts without codable children: Secondary | ICD-10-CM

## 2017-03-15 DIAGNOSIS — I10 Essential (primary) hypertension: Secondary | ICD-10-CM | POA: Diagnosis not present

## 2017-03-15 DIAGNOSIS — E119 Type 2 diabetes mellitus without complications: Secondary | ICD-10-CM | POA: Diagnosis not present

## 2017-03-15 DIAGNOSIS — Z794 Long term (current) use of insulin: Secondary | ICD-10-CM

## 2017-03-15 DIAGNOSIS — M1712 Unilateral primary osteoarthritis, left knee: Secondary | ICD-10-CM

## 2017-03-15 LAB — GLUCOSE, POCT (MANUAL RESULT ENTRY): POC Glucose: 74 mg/dl (ref 70–99)

## 2017-03-15 MED ORDER — ONETOUCH DELICA LANCETS 33G MISC: 5 refills | Status: DC

## 2017-03-15 MED ORDER — GLUCOSE BLOOD VI STRP: ORAL_STRIP | 5 refills | Status: DC

## 2017-03-15 NOTE — Patient Instructions (Signed)
F/u as before in  November, call if you need me sooner  Keep appt with Dr Aline Brochure please  Stop tramadol since makes you feel sick , use ES tylenol 500 mg tablet one two to three times per day  Careful not to fall.  We will test sugar in office today since you report it very high  Test strips for 3 times daily testing for one touch ultra will be sent

## 2017-03-16 ENCOUNTER — Encounter: Payer: Self-pay | Admitting: Family Medicine

## 2017-03-16 DIAGNOSIS — M1712 Unilateral primary osteoarthritis, left knee: Secondary | ICD-10-CM | POA: Insufficient documentation

## 2017-03-16 DIAGNOSIS — M170 Bilateral primary osteoarthritis of knee: Secondary | ICD-10-CM | POA: Insufficient documentation

## 2017-03-16 NOTE — Assessment & Plan Note (Signed)
Controlled, no change in medication  

## 2017-03-16 NOTE — Progress Notes (Signed)
   [name] [name]     MRN: [id number]      DOB: 1945/11/03   HPI [name] is here for follow up from ED visit on 7/29 for painful swollen left knee. States pain is less, but states she is intolerant of the tramadol prescribed , as it makes her nauseous, and she feels that ist is causing her blood sugar to be elevated. States her blood sugar was over 300 this morning, in the office when tested  it was 75  Though her arthritis is severe, she denies instability, and reports that she is advised against surgery by her her cardiologists. Has appt with ortho next week , when she is still awaiting a knee brace  ROS See HPI  Denies recent fever or chills. Denies sinus pressure, nasal congestion, ear pain or sore throat. Denies chest congestion, productive cough or wheezing. Denies chest pains, palpitations and leg swelling Denies abdominal pain,  vomiting,diarrhea or constipation.   Denies dysuria, frequency, hesitancy or incontinence. . Denies skin break down or rash.   PE  BP 124/82   Pulse 73   Resp 16   Ht 5\' 5"  (1.651 m)   Wt 184 lb (83.5 kg)   SpO2 98%   BMI 30.62 kg/m   Patient alert and oriented and in no cardiopulmonary distress. Pt in pain and appears anxious and somewhat depressed, concerned about her knee HEENT: No facial asymmetry, EOMI,   oropharynx pink and moist.  Neck supple no JVD, no mass.  Chest: Clear to auscultation bilaterally.  CVS: S1, S2 systolic murmur, no S3.Regular rate.  ABD: Soft non tender.   Ext: No edema  MS: Adequate ROM spine, shoulders, hips and reduced in left  knee wearing a sleeve.  Skin: Intact, no ulcerations or rash noted.    CNS: CN 2-12 intact, power,  normal throughout.no focal deficits noted.   Assessment & Plan  Encounter for examination following treatment at hospital Acute ED voisit 3 days prior for acute on chronic left knee pain , reports slight improvement with icing and use of a sleeve. Will f/u with ortho in 10  days. Will use ES tylenol forr pain relief as tramadol causes excess nausea Fall precautions discussed  Essential hypertension, benign Controlled, no change in medication   Diabetes mellitus, insulin dependent (IDDM), controlled (HCC) mildly hypoglycemic in office despite report of earlier hyperglycemia, pt somewhat confused and requiring new testing supplies as she has a new meter , will ensure same are prescribed   Osteoarthritis of left knee Severe with disabling pain, awaiting brace from ortho, f/u asap, limited pain management options due to her co morbidities and chronic meds

## 2017-03-16 NOTE — Assessment & Plan Note (Signed)
Acute ED voisit 3 days prior for acute on chronic left knee pain , reports slight improvement with icing and use of a sleeve. Will f/u with ortho in 10 days. Will use ES tylenol forr pain relief as tramadol causes excess nausea Fall precautions discussed

## 2017-03-16 NOTE — Telephone Encounter (Signed)
As of 03/14/17, [employer] representative responded via email regarding needing additional notes. Dr Aline Brochure had completed, and we have faxed to rep, Shannan Harper. Notified patient of status.  Patient has upcoming appointment; aware

## 2017-03-16 NOTE — Assessment & Plan Note (Addendum)
Severe with disabling pain, awaiting brace from ortho, f/u asap, limited pain management options due to her co morbidities and chronic meds

## 2017-03-16 NOTE — Assessment & Plan Note (Signed)
mildly hypoglycemic in office despite report of earlier hyperglycemia, pt somewhat confused and requiring new testing supplies as she has a new meter , will ensure same are prescribed

## 2017-03-23 ENCOUNTER — Other Ambulatory Visit: Payer: Self-pay | Admitting: Family Medicine

## 2017-03-23 ENCOUNTER — Telehealth: Payer: Self-pay | Admitting: Family Medicine

## 2017-03-23 DIAGNOSIS — M79645 Pain in left finger(s): Secondary | ICD-10-CM

## 2017-03-23 NOTE — Telephone Encounter (Signed)
Pt aware.

## 2017-03-23 NOTE — Telephone Encounter (Signed)
Referred pls let her know

## 2017-03-23 NOTE — Progress Notes (Unsigned)
amb ortho amb ortho

## 2017-03-23 NOTE — Telephone Encounter (Signed)
Patient has an appt with Dr. Arther Abbott TOMORROW at 10:50 to be seen about her L knee (bone on bone she says) and she asked the nurse  if Dr. Aline Brochure could could take a look at her finger on L hand, but patient was told that she would need a referral from Dr. Moshe Cipro.  Patient has [employer]. If we can help her before tomorrows appointment, let me know what we need to do.

## 2017-03-23 NOTE — Telephone Encounter (Signed)
Left index finger pain on and off for awhile now. Really stiff and sore and is seeing Aline Brochure tomorrow for her knee pain and was wanting a referral for her left finger so hopefully he could look at that too

## 2017-03-23 NOTE — Telephone Encounter (Signed)
En 8 1 18

## 2017-03-24 ENCOUNTER — Ambulatory Visit (INDEPENDENT_AMBULATORY_CARE_PROVIDER_SITE_OTHER): Payer: PPO | Admitting: Orthopedic Surgery

## 2017-03-24 DIAGNOSIS — M1712 Unilateral primary osteoarthritis, left knee: Secondary | ICD-10-CM | POA: Diagnosis not present

## 2017-03-24 DIAGNOSIS — M25562 Pain in left knee: Secondary | ICD-10-CM | POA: Diagnosis not present

## 2017-03-24 DIAGNOSIS — G8929 Other chronic pain: Secondary | ICD-10-CM | POA: Diagnosis not present

## 2017-03-24 NOTE — Telephone Encounter (Signed)
While in office today, patient has asked about the knee brace has still not heard anything about this. I spoke to [place] with Charlynne Cousins and Arbie Cookey has spoken to him as well. He has stated patient must have documented instability of her knee, Arbie Cookey is asking about cost of the brace if insurance will not cover, and she states she will call patient.

## 2017-03-24 NOTE — Progress Notes (Signed)
[name] [name] was last seen by [place] 12/17/2016  This is a exacerbation of her prior problem routine office visit  This is a 71 year old female who had a heart valve placed is not a candidate for knee surgery my opinion who presents with increasing knee pain recent visit to the emergency room where they found arthritis without exacerbation. She complains of moderate dull aching constant pain over the medial aspect of her right knee. Her x-rays show severe varus deformity.  She could not get a knee brace for her insurance because she didn't have instability of the knee just arthritis.  She did well with an injection on prior visits.  Review of Systems  Respiratory: Negative for shortness of breath.   Cardiovascular: Negative for chest pain.   Past Medical History:  Diagnosis Date  . Anxiety   . Aortic stenosis   . Arthritis   . Chronic systolic heart failure ([place])   . CKD (chronic kidney disease) stage 3, GFR 30-59 ml/min   . Constipation   . Depression   . Diabetes mellitus, type 2 ([place])   . Essential hypertension, benign   . Hyperlipidemia   . Incidental pulmonary nodule, > 78mm and < 75mm    7 x 4 mm LLL nodule  . LBBB (left bundle branch block)   . Nonischemic cardiomyopathy (HCC)    No significant obstructive CAD at heart catheterization 05/2014, LVEF 20%  . Obesity   . Pneumonia 2013  . 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 Symptomatic PVCs    There were no vitals taken for this visit. She is well-developed and nourished grooming and hygiene are normal, she is oriented 3. Her mood and affect are pleasant and flat.  She walks with a varus thrust and about varus knee with slight limp  She has tenderness over the medial compartment with varus deformity of the left knee her flexion arc is approximately 115 today. There is no effusion she has no instability it was  rechecked. Motor exam is normal skin is intact neurovascular exam is normal  As far as her other knee goes there is no swelling or instability at present.  [facility] taken 03/12/2017 at [facility] I see medial compartment narrowing the tibiofemoral alignment still looks to be valgus to me. Subchondral sclerosis and cyst formation is noted although there are not a significant amount of osteophytes  Impression on my interpretation is severe arthritis left knee  Repeat injection. We called the brace company and they confirm that her insurance will not cover a brace unless there is instability  I repeated the injection  Procedure note left knee injection verbal consent was obtained to inject left knee joint  Timeout was completed to confirm the site of injection  The medications used were 40 mg of Depo-Medrol and 1% lidocaine 3 cc  Anesthesia was provided by ethyl chloride and the skin was prepped with alcohol.  After cleaning the skin with alcohol a 20-gauge needle was used to inject the left knee joint. There were no complications. A sterile bandage was applied.   Encounter Diagnoses  Name Primary?  . Chronic pain of left knee Yes  . Primary osteoarthritis of left knee

## 2017-03-24 NOTE — Patient Instructions (Signed)

## 2017-03-27 ENCOUNTER — Ambulatory Visit: Payer: PPO | Admitting: Cardiology

## 2017-04-03 ENCOUNTER — Telehealth: Payer: Self-pay | Admitting: Orthopedic Surgery

## 2017-04-03 NOTE — Telephone Encounter (Signed)
Patient is interested in [employer] knee brace# [id number] ( as discussed with patient at office visit 03/24/17. (note: our staff nurse was out of clinic on this day, however, brace was offered to patient per Dr Aline Brochure with clinical staff member Amy.  Patient had been considering, and now does wish to try.  Per [employer] brace representative, Shannan Harper, said we can try insurance first; if denied, patient may purchase the brace at time of service discount price, $68.24 (check may be issued to [facility]) Patient aware, and will come by office later this week for brace.

## 2017-04-05 NOTE — Progress Notes (Signed)
Cardiology Office Note  Date: 04/06/2017   ID: Leba, Tibbitts 1946-05-18, MRN [id number]  PCP: Fayrene Helper, MD  Primary Cardiologist: Rozann Lesches, MD   Chief Complaint  Patient presents with  . Cardiac follow-up    History of Present Illness: [name] [name] is a 71 y.o. female last seen in February.She presents for a routine follow-up visit. From a cardiac perspective, she reports no worsening shortness of breath, no palpitations or chest pain. She is functionally limited by chronic knee pain, is following with Dr. Aline Brochure. She states that she is having a knee brace fitted tomorrow. Surgical intervention is not planned.  Last echocardiogram was in August 2017 as outlined below. We discussed obtaining a follow-up study for comparison. Of note, she does have a nonischemic cardiomyopathy with LVEF 25% by last assessment. She does not want to pursue an ICD. She denies any syncope.  I reviewed her medications. Cardiac regimen includes aspirin, Coreg, Zetia, TriCor, Lasix, potassium supplements, and Aldactone. I reviewed her recent lab work from June.  I personally reviewed her ECG today which shows sinus rhythm with left bundle branch block.  Past Medical History:  Diagnosis Date  . Anxiety   . Aortic stenosis   . Arthritis   . Chronic systolic heart failure ([place])   . CKD (chronic kidney disease) stage 3, GFR 30-59 ml/min   . Constipation   . Depression   . Diabetes mellitus, type 2 ([place])   . Essential hypertension, benign   . Hyperlipidemia   . Incidental pulmonary nodule, > 56mm and < 64mm    7 x 4 mm LLL nodule  . LBBB (left bundle branch block)   . Nonischemic cardiomyopathy (HCC)    No significant obstructive CAD at heart catheterization 05/2014, LVEF 20%  . Obesity   . Pneumonia 2013  . 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  Symptomatic PVCs     Past Surgical History:  Procedure Laterality Date  . CARDIAC CATHETERIZATION    . CESAREAN SECTION  1987  . FLEXIBLE BRONCHOSCOPY N/A 01/14/2015   Procedure: FLEXIBLE BRONCHOSCOPY;  Surgeon: [name], MD;  Location: [place];  Service: Thoracic;  Laterality: N/A;  . LEFT AND RIGHT HEART CATHETERIZATION WITH CORONARY ANGIOGRAM N/A 12/05/2011   Procedure: LEFT AND RIGHT HEART CATHETERIZATION WITH CORONARY ANGIOGRAM;  Surgeon: [name], MD;  Location: [facility] CATH LAB;  Service: Cardiovascular;  Laterality: N/A;  . LEFT AND RIGHT HEART CATHETERIZATION WITH CORONARY ANGIOGRAM N/A 05/23/2014   Procedure: LEFT AND RIGHT HEART CATHETERIZATION WITH CORONARY ANGIOGRAM;  Surgeon: [name], MD;  Location: [facility] CATH LAB;  Service: Cardiovascular;  Laterality: N/A;  . MULTIPLE EXTRACTIONS WITH ALVEOLOPLASTY N/A 02/27/2014   Procedure: Extraction of tooth #'s 2,4,5,6,7,8,9,10,11,12, 22, 23, 24, 25, 26, 28 with alveoloplasty and bilateral mandibular tori reductions;  Surgeon: [name], DDS;  Location: [place];  Service: Oral Surgery;  Laterality: N/A;  . RIGID BRONCHOSCOPY N/A 01/14/2015   Procedure: RIGID BRONCHOSCOPY with Removal Of Foreign Body ;  Surgeon: [name], MD;  Location: [place];  Service: Thoracic;  Laterality: N/A;  . TEE WITHOUT CARDIOVERSION N/A 03/24/2015   Procedure: TRANSESOPHAGEAL ECHOCARDIOGRAM (TEE);  Surgeon: [name], MD;  Location: [place];  Service: Open Heart Surgery;  Laterality: N/A;  . TRANSCATHETER AORTIC VALVE REPLACEMENT, TRANSFEMORAL Bilateral 03/24/2015   Procedure: TRANSCATHETER AORTIC  VALVE REPLACEMENT, TRANSFEMORAL;  Surgeon: [name], MD;  Location: [place];  Service: Open Heart Surgery;  Laterality: Bilateral;  . TUBAL LIGATION  1987  . VIDEO BRONCHOSCOPY Bilateral 06/05/2014   Procedure: VIDEO BRONCHOSCOPY WITHOUT FLUORO;  Surgeon: [name], MD;  Location: [facility] ENDOSCOPY;   Service: Cardiopulmonary;  Laterality: Bilateral;    Current Outpatient Prescriptions  Medication Sig Dispense Refill  . acetaminophen (TYLENOL) 500 MG tablet Take 1,000 mg by mouth every 6 (six) hours as needed for moderate pain.    Marland Kitchen aspirin EC 81 MG tablet Take 81 mg by mouth daily.    . carvedilol (COREG) 25 MG tablet TAKE 1 TABLET BY MOUTH TWICE DAILY WITH MEALS. 60 tablet 6  . ezetimibe (ZETIA) 10 MG tablet TAKE ONE TABLET BY MOUTH ONCE DAILY. 90 tablet 1  . fenofibrate (TRICOR) 145 MG tablet TAKE ONE TABLET BY MOUTH ONCE DAILY. 30 tablet 0  . furosemide (LASIX) 40 MG tablet TAKE 1 TABLET BY MOUTH EVERY MORNING. 90 tablet 1  . GLIPIZIDE XL 5 MG 24 hr tablet TAKE 1 TABLET BY MOUTH DAILY WITH BREAKFAST. 90 tablet 1  . glucose blood (ONE TOUCH ULTRA TEST) test strip Use as instructed three times daily dx: e11.65 100 each 5  . LIVALO 2 MG TABS TAKE ONE TABLET BY MOUTH ONCE DAILY AFTER SUPPER. 90 tablet 1  . Multiple Vitamins-Minerals (MULTIVITAMIN PO) Take 1 tablet by mouth daily.    . niacin (NIASPAN) 500 MG CR tablet TAKE (1) TABLET BY MOUTH AT BEDTIME. 90 tablet 0  . NOVOLOG MIX 70/30 FLEXPEN (70-30) 100 UNIT/ML FlexPen INJECT 12 UNITS SUBCUTANEOUSLY TWICE DAILY. 30 mL 5  . ONETOUCH DELICA LANCETS 74Q MISC Three times daily testing dx e11.65 100 each 5  . potassium chloride (K-DUR) 10 MEQ tablet TAKE ONE TABLET BY MOUTH DAILY. DO NOT TAKE IF NOT TAKING FUROSEMIDE. 90 tablet 0  . spironolactone (ALDACTONE) 25 MG tablet TAKE 1 TABLET BY MOUTH ONCE DAILY. 90 tablet 0  . traMADol (ULTRAM) 50 MG tablet     . UNIFINE PENTIPS 31G X 8 MM MISC USE AS NEEDED TO INJECT INSULIN. 100 each 3   No current facility-administered medications for this visit.    Allergies:  Crestor [rosuvastatin]; Tramadol; and Penicillins   Social History: The patient  reports that she has never smoked. She has never used smokeless tobacco. She reports that she does not drink alcohol or use drugs.   ROS:  Please see  the history of present illness. Otherwise, complete review of systems is positive for chronic knee pain.  All other systems are reviewed and negative.   Physical Exam: VS:  BP 106/64 (BP Location: Right Arm)   Pulse 69   Ht 5\' 6"  (1.676 m)   Wt 181 lb (82.1 kg)   SpO2 98%   BMI 29.21 kg/m , BMI Body mass index is 29.21 kg/m.  Wt Readings from Last 3 Encounters:  04/06/17 181 lb (82.1 kg)  03/15/17 184 lb (83.5 kg)  03/12/17 180 lb (81.6 kg)    Overweight woman, in no distress  HEENT: Conjunctiva and lids normal, oropharynx clear.  Neck: Supple, no carotid bruits, no thyromegaly.  Lungs: Clear to auscultation, nonlabored breathing at rest.  Cardiac: Regular rate and rhythm, indistinct PMI, no S3, 3/6 systolic murmur, no pericardial rub.  Abdomen: Soft, nontender, bowel sounds present, no guarding or rebound.  Extremities: Mild right ankle edema, distal pulses 2+.  Skin: Warm and dry. Musculoskeletal: No kyphosis.  Neuropsychiatric: Alert and oriented 3, affect appropriate.  ECG: I personally reviewed the tracing from 01/01/2016 which showed sinus rhythm with left atrial enlargement and left bundle branch block.  Recent Labwork: 09/27/2016: ALT 14; AST 21; Hemoglobin 12.2; Platelets 234; TSH 1.58 02/06/2017: BUN 36; Creat 1.88; Potassium 4.2; Sodium 140     Component Value Date/Time   CHOL 157 02/06/2017 0916   TRIG 103 02/06/2017 0916   HDL 56 02/06/2017 0916   CHOLHDL 2.8 02/06/2017 0916   VLDL 21 02/06/2017 0916   LDLCALC 80 02/06/2017 0916    Other Studies Reviewed Today:  Echocardiogram 04/07/2016: Study Conclusions  - Left ventricle: The cavity size was mildly dilated. Wall   thickness was increased in a pattern of mild LVH. The estimated   ejection fraction was 25%. Diffuse hypokinesis. Septal-lateral   dyssynchrony. Doppler parameters are consistent with abnormal   left ventricular relaxation (grade 1 diastolic dysfunction). - Aortic valve: There was a  bioprosthetic aortic valve s/p   transcatheter aortic valve replacement. The valve was   well-seated. There was no regurgitation. Mean gradient (S): 15 mm   Hg. - Aorta: Mildly dilated ascending aorta. Ascending aortic diameter:   37 mm (S). - Mitral valve: Mildly calcified annulus. There was mild   regurgitation. - Left atrium: The atrium was at the upper limits of normal in   size. - Right ventricle: The cavity size was normal. Systolic function   was normal. - Tricuspid valve: Peak RV-RA gradient (S): 24 mm Hg. - Pulmonary arteries: PA peak pressure: 27 mm Hg (S). - Inferior vena cava: The vessel was normal in size. The   respirophasic diameter changes were in the normal range (= 50%),   consistent with normal central venous pressure.  Impressions:  - Mildly dilated LV with mild LV hypertrophy. EF 25% with diffuse   hypokinesis and septal-lateral dyssynchrony. Normal RV size and   systolic function. Bioprosthetic aortic valve s/p TAVR appeared   to function normally.  Assessment and Plan:  1. Aortic stenosis status post TAVR in August 2016. She is due for a follow-up echocardiogram which will be arranged. Remains symptomatically stable.  2. Nonischemic cardiomyopathy with LVEF 25% by assessment last August. She remained stable on medical therapy and has declined ICD. She is not on ACE inhibitor or ARB with renal insufficiency.  3. CKD stage III, last creatinine 1.8. He is following with Dr. Moshe Cipro.  4. Hyperlipidemia, on Zetia and TriCor. Last LDL was 80.  Current medicines were reviewed with the patient today.   Orders Placed This Encounter  Procedures  . EKG 12-Lead  . ECHOCARDIOGRAM COMPLETE    Disposition: Follow-up in 6 months.  Signed, [name], MD, [facility] 04/06/2017 10:51 AM    [place] [facility] [place]. [address], [place], [place] [address] Phone: [phone]; Fax: [phone]

## 2017-04-06 ENCOUNTER — Ambulatory Visit (INDEPENDENT_AMBULATORY_CARE_PROVIDER_SITE_OTHER): Payer: PPO | Admitting: Cardiology

## 2017-04-06 ENCOUNTER — Encounter: Payer: Self-pay | Admitting: Cardiology

## 2017-04-06 VITALS — BP 106/64 | HR 69 | Ht 66.0 in | Wt 181.0 lb

## 2017-04-06 DIAGNOSIS — N183 Chronic kidney disease, stage 3 unspecified: Secondary | ICD-10-CM

## 2017-04-06 DIAGNOSIS — E782 Mixed hyperlipidemia: Secondary | ICD-10-CM | POA: Diagnosis not present

## 2017-04-06 DIAGNOSIS — Z952 Presence of prosthetic heart valve: Secondary | ICD-10-CM | POA: Diagnosis not present

## 2017-04-06 DIAGNOSIS — I428 Other cardiomyopathies: Secondary | ICD-10-CM

## 2017-04-06 NOTE — Patient Instructions (Signed)

## 2017-04-10 DIAGNOSIS — M1712 Unilateral primary osteoarthritis, left knee: Secondary | ICD-10-CM | POA: Diagnosis not present

## 2017-04-11 ENCOUNTER — Ambulatory Visit (HOSPITAL_COMMUNITY)
Admission: RE | Admit: 2017-04-11 | Discharge: 2017-04-11 | Disposition: A | Discharge status: Home / Self Care | Payer: PPO | Source: Ambulatory Visit | Attending: Cardiology | Admitting: Cardiology

## 2017-04-11 DIAGNOSIS — I10 Essential (primary) hypertension: Secondary | ICD-10-CM | POA: Insufficient documentation

## 2017-04-11 DIAGNOSIS — I340 Nonrheumatic mitral (valve) insufficiency: Secondary | ICD-10-CM | POA: Diagnosis not present

## 2017-04-11 DIAGNOSIS — I429 Cardiomyopathy, unspecified: Secondary | ICD-10-CM | POA: Diagnosis not present

## 2017-04-11 DIAGNOSIS — E785 Hyperlipidemia, unspecified: Secondary | ICD-10-CM | POA: Insufficient documentation

## 2017-04-11 DIAGNOSIS — E119 Type 2 diabetes mellitus without complications: Secondary | ICD-10-CM | POA: Insufficient documentation

## 2017-04-11 DIAGNOSIS — I447 Left bundle-branch block, unspecified: Secondary | ICD-10-CM | POA: Diagnosis not present

## 2017-04-11 DIAGNOSIS — Z48812 Encounter for surgical aftercare following surgery on the circulatory system: Secondary | ICD-10-CM | POA: Insufficient documentation

## 2017-04-11 DIAGNOSIS — Z952 Presence of prosthetic heart valve: Secondary | ICD-10-CM | POA: Insufficient documentation

## 2017-04-11 NOTE — Progress Notes (Signed)
*  PRELIMINARY RESULTS* Echocardiogram 2D Echocardiogram has been performed.  [name] [name] 04/11/2017, 11:45 AM

## 2017-04-12 ENCOUNTER — Telehealth: Payer: Self-pay | Admitting: *Deleted

## 2017-04-12 NOTE — Telephone Encounter (Signed)
-----   Message from [name], MD sent at 04/11/2017 11:53 AM EDT ----- Results reviewed. Overall stable findings, LVEF 20-25% and adequate TAVR function. Continue with current medical regimen. A copy of this test should be forwarded to Fayrene Helper, MD.

## 2017-04-24 ENCOUNTER — Other Ambulatory Visit: Payer: Self-pay | Admitting: Family Medicine

## 2017-04-24 NOTE — Telephone Encounter (Signed)
seen 8 1 18

## 2017-04-24 NOTE — Telephone Encounter (Signed)
Seen 8 1 18 

## 2017-06-19 ENCOUNTER — Other Ambulatory Visit: Payer: Self-pay | Admitting: Family Medicine

## 2017-06-28 ENCOUNTER — Ambulatory Visit: Payer: PPO | Admitting: Orthopedic Surgery

## 2017-07-10 ENCOUNTER — Ambulatory Visit: Payer: PPO | Admitting: Family Medicine

## 2017-07-18 ENCOUNTER — Encounter: Payer: Self-pay | Admitting: Family Medicine

## 2017-07-25 ENCOUNTER — Other Ambulatory Visit: Payer: Self-pay | Admitting: Cardiology

## 2017-07-31 ENCOUNTER — Ambulatory Visit (INDEPENDENT_AMBULATORY_CARE_PROVIDER_SITE_OTHER): Payer: PPO | Admitting: Family Medicine

## 2017-07-31 ENCOUNTER — Encounter: Payer: Self-pay | Admitting: Family Medicine

## 2017-07-31 VITALS — BP 120/70 | HR 67 | Resp 16 | Ht 66.0 in | Wt 184.0 lb

## 2017-07-31 DIAGNOSIS — E785 Hyperlipidemia, unspecified: Secondary | ICD-10-CM | POA: Diagnosis not present

## 2017-07-31 DIAGNOSIS — IMO0001 Reserved for inherently not codable concepts without codable children: Secondary | ICD-10-CM

## 2017-07-31 DIAGNOSIS — I10 Essential (primary) hypertension: Secondary | ICD-10-CM

## 2017-07-31 DIAGNOSIS — Z23 Encounter for immunization: Secondary | ICD-10-CM

## 2017-07-31 DIAGNOSIS — Z794 Long term (current) use of insulin: Secondary | ICD-10-CM | POA: Diagnosis not present

## 2017-07-31 DIAGNOSIS — E119 Type 2 diabetes mellitus without complications: Secondary | ICD-10-CM

## 2017-07-31 DIAGNOSIS — I5042 Chronic combined systolic (congestive) and diastolic (congestive) heart failure: Secondary | ICD-10-CM

## 2017-07-31 DIAGNOSIS — M1712 Unilateral primary osteoarthritis, left knee: Secondary | ICD-10-CM | POA: Diagnosis not present

## 2017-07-31 MED ORDER — SPIRONOLACTONE 25 MG PO TABS: 25.0000 mg | ORAL_TABLET | Freq: Every day | ORAL | 1 refills | Status: DC

## 2017-07-31 MED ORDER — PITAVASTATIN CALCIUM 2 MG PO TABS: ORAL_TABLET | ORAL | 1 refills | Status: DC

## 2017-07-31 MED ORDER — EZETIMIBE 10 MG PO TABS: 10.0000 mg | ORAL_TABLET | Freq: Every day | ORAL | 1 refills | Status: DC

## 2017-07-31 MED ORDER — GLIPIZIDE ER 5 MG PO TB24: 5.0000 mg | ORAL_TABLET | Freq: Every day | ORAL | 1 refills | Status: DC

## 2017-07-31 MED ORDER — NIACIN ER (ANTIHYPERLIPIDEMIC) 500 MG PO TBCR: EXTENDED_RELEASE_TABLET | ORAL | 1 refills | Status: DC

## 2017-07-31 MED ORDER — POTASSIUM CHLORIDE ER 10 MEQ PO TBCR: EXTENDED_RELEASE_TABLET | ORAL | 1 refills | Status: DC

## 2017-07-31 NOTE — Patient Instructions (Addendum)
Wellness visit with nurse in 3 months,  Annual physical exam with MD in 4. months, call if you need me ebfore  Foot exam and flu vaccine today  Microalb from office today  Lab today HBA1C, chem 7 and EGFR today   Use tylenol ES at night if needed for arthritis pain and a cream is sent to rub on knee for arthritis  Careful not to fall  All the best for 2019   

## 2017-08-01 LAB — COMPLETE METABOLIC PANEL WITH GFR
AG Ratio: 1.6 (calc) (ref 1.0–2.5)
ALT: 19 U/L (ref 6–29)
AST: 22 U/L (ref 10–35)
Albumin: 4.5 g/dL (ref 3.6–5.1)
Alkaline phosphatase (APISO): 31 U/L — ABNORMAL LOW (ref 33–130)
BUN/Creatinine Ratio: 19 (calc) (ref 6–22)
BUN: 32 mg/dL — ABNORMAL HIGH (ref 7–25)
CO2: 30 mmol/L (ref 20–32)
Calcium: 10.2 mg/dL (ref 8.6–10.4)
Chloride: 103 mmol/L (ref 98–110)
Creat: 1.71 mg/dL — ABNORMAL HIGH (ref 0.60–0.93)
GFR, Est African American: 34 mL/min/{1.73_m2} — ABNORMAL LOW (ref >=60)
GFR, Est Non African American: 29 mL/min/{1.73_m2} — ABNORMAL LOW (ref >=60)
Globulin: 2.8 g/dL (calc) (ref 1.9–3.7)
Glucose, Bld: 148 mg/dL — ABNORMAL HIGH (ref 65–139)
Potassium: 3.8 mmol/L (ref 3.5–5.3)
Sodium: 140 mmol/L (ref 135–146)
Total Bilirubin: 0.5 mg/dL (ref 0.2–1.2)
Total Protein: 7.3 g/dL (ref 6.1–8.1)

## 2017-08-01 LAB — HEMOGLOBIN A1C
Hgb A1c MFr Bld: 7 % of total Hgb — ABNORMAL HIGH (ref <5.7)
Mean Plasma Glucose: 154 (calc)
eAG (mmol/L): 8.5 (calc)

## 2017-08-06 ENCOUNTER — Encounter: Payer: Self-pay | Admitting: Family Medicine

## 2017-08-06 NOTE — Assessment & Plan Note (Signed)
Stable , no decompensation  At this time , follows with cardiology

## 2017-08-06 NOTE — Progress Notes (Signed)
[name] [name]     MRN: [id number]      DOB: May 16, 1946   HPI [name] [name] is here for follow up and re-evaluation of chronic medical conditions, medication management and review of any available recent lab and radiology data.  Preventive health is updated, specifically  Cancer screening and Immunization.   Questions or concerns regarding consultations or procedures which the PT has had in the interim are  addressed. The PT denies any adverse reactions to current medications since the last visit.  C/o increased bilateral knee pain ,wants help Denies polyuria, polydipsia, blurred vision , or hypoglycemic episodes.    ROS Denies recent fever or chills. Denies sinus pressure, nasal congestion, ear pain or sore throat. Denies chest congestion, productive cough or wheezing. Denies chest pains, palpitations and leg swelling Denies abdominal pain, nausea, vomiting,diarrhea or constipation.   Denies dysuria, frequency, hesitancy or incontinence. Denies joint pain, swelling and limitation in mobility.  Denies depression, anxiety or insomnia. Denies skin break down or rash.   PE  BP 120/70   Pulse 67   Resp 16   Ht 5\' 6"  (1.676 m)   Wt 184 lb (83.5 kg)   SpO2 99%   BMI 29.70 kg/m   Patient alert and oriented and in no cardiopulmonary distress.  HEENT: No facial asymmetry, EOMI,   oropharynx pink and moist.  Neck supple no JVD, no mass.  Chest: Clear to auscultation bilaterally.  CVS: S1, S2 systolic  murmur, no S3.Regular rate.  ABD: Soft non tender.   Ext: No edema  HO:[id number]  ROM spine, shoulders, hips and most marked in left  knee.  Skin: Intact, no ulcerations or rash noted.  Psych: Good eye contact, normal affect. Memory intact not anxious or depressed appearing.  CNS: CN 2-12 intact, power,  normal throughout.no focal deficits noted.   Assessment & Plan  Essential hypertension, benign Controlled, no change in medication DASH diet and commitment to daily  physical activity for a minimum of 30 minutes discussed and encouraged, as a part of hypertension management. The importance of attaining a healthy weight is also discussed.  BP/Weight 07/31/2017 04/06/2017 03/15/2017 03/12/2017 02/08/2017 12/14/2016 3/70/4888  Systolic BP 916 945 038 882 800 349 179  Diastolic BP 70 64 82 72 78 80 64  Wt. (Lbs) 184 181 184 180 184.08 186 189.04  BMI 29.7 29.21 30.62 29.05 30.63 30.95 31.46       Hyperlipidemia LDL goal <100 Hyperlipidemia:Low fat diet discussed and encouraged.   Lipid Panel  Lab Results  Component Value Date   CHOL 157 02/06/2017   HDL 56 02/06/2017   LDLCALC 80 02/06/2017   TRIG 103 02/06/2017   CHOLHDL 2.8 02/06/2017   Updated lab needed at/ before next visit. Controlled, no change in medication     Osteoarthritis of left knee Increased and uncontrolled, topical prep prescribed and she is to use ES tylenol once to twice daily  Diabetes mellitus, insulin dependent (IDDM), controlled ([place]) [name] [name] is reminded of the importance of commitment to daily physical activity for 30 minutes or more, as able and the need to limit carbohydrate intake to 30 to 60 grams per meal to help with blood sugar control.   The need to take medication as prescribed, test blood sugar as directed, and to call between visits if there is a concern that blood sugar is uncontrolled is also discussed.   [name] [name] is reminded of the importance of daily foot exam, annual eye examination, and good  blood sugar, blood pressure and cholesterol control. Improved and controlled. Pt applauded on this  Diabetic Labs Latest Ref Rng & Units 07/31/2017 02/06/2017 09/27/2016 06/22/2016 03/29/2016  HbA1c <5.7 % of total Hgb 7.0(H) 7.5(H) 7.6(H) 7.3(H) -  Microalbumin Not Estab. ug/mL - - - - <3.0(H)  Micro/Creat Ratio 0.0 - 30.0 mg/g creat - - - - <10.5  Chol <200 mg/dL - 157 194 194 -  HDL >50 mg/dL - 56 56 60 -  Calc LDL <100 mg/dL - 80 119(H) 121(H) -    Triglycerides <150 mg/dL - 103 97 66 -  Creatinine 0.60 - 0.93 mg/dL 1.71(H) 1.88(H) 2.14(H) 1.46(H) -   BP/Weight 07/31/2017 04/06/2017 03/15/2017 03/12/2017 02/08/2017 12/14/2016 05/04/1006  Systolic BP 121 975 883 254 982 641 583  Diastolic BP 70 64 82 72 78 80 64  Wt. (Lbs) 184 181 184 180 184.08 186 189.04  BMI 29.7 29.21 30.62 29.05 30.63 30.95 31.46   Foot/eye exam completion dates Latest Ref Rng & Units 07/31/2017 05/31/2016  Eye Exam No Retinopathy - No Retinopathy  Foot exam Order - - -  Foot Form Completion - Done -        Chronic combined systolic and diastolic CHF (congestive heart failure) (HCC) Stable , no decompensation  At this time , follows with cardiology

## 2017-08-06 NOTE — Assessment & Plan Note (Signed)
Controlled, no change in medication DASH diet and commitment to daily physical activity for a minimum of 30 minutes discussed and encouraged, as a part of hypertension management. The importance of attaining a healthy weight is also discussed.  BP/Weight 07/31/2017 04/06/2017 03/15/2017 03/12/2017 02/08/2017 12/14/2016 08/29/7260  Systolic BP 035 597 416 384 536 468 032  Diastolic BP 70 64 82 72 78 80 64  Wt. (Lbs) 184 181 184 180 184.08 186 189.04  BMI 29.7 29.21 30.62 29.05 30.63 30.95 31.46

## 2017-08-06 NOTE — Assessment & Plan Note (Signed)
[name] is reminded of the importance of commitment to daily physical activity for 30 minutes or more, as able and the need to limit carbohydrate intake to 30 to 60 grams per meal to help with blood sugar control.   The need to take medication as prescribed, test blood sugar as directed, and to call between visits if there is a concern that blood sugar is uncontrolled is also discussed.   [name] is reminded of the importance of daily foot exam, annual eye examination, and good blood sugar, blood pressure and cholesterol control. Improved and controlled. Pt applauded on this  Diabetic Labs Latest Ref Rng & Units 07/31/2017 02/06/2017 09/27/2016 06/22/2016 03/29/2016  HbA1c <5.7 % of total Hgb 7.0(H) 7.5(H) 7.6(H) 7.3(H) -  Microalbumin Not Estab. ug/mL - - - - <3.0(H)  Micro/Creat Ratio 0.0 - 30.0 mg/g creat - - - - <10.5  Chol <200 mg/dL - 157 194 194 -  HDL >50 mg/dL - 56 56 60 -  Calc LDL <100 mg/dL - 80 119(H) 121(H) -  Triglycerides <150 mg/dL - 103 97 66 -  Creatinine 0.60 - 0.93 mg/dL 1.71(H) 1.88(H) 2.14(H) 1.46(H) -   BP/Weight 07/31/2017 04/06/2017 03/15/2017 03/12/2017 02/08/2017 12/14/2016 0/96/4383  Systolic BP 818 403 754 360 677 034 035  Diastolic BP 70 64 82 72 78 80 64  Wt. (Lbs) 184 181 184 180 184.08 186 189.04  BMI 29.7 29.21 30.62 29.05 30.63 30.95 31.46   Foot/eye exam completion dates Latest Ref Rng & Units 07/31/2017 05/31/2016  Eye Exam No Retinopathy - No Retinopathy  Foot exam Order - - -  Foot Form Completion - Done -

## 2017-08-06 NOTE — Assessment & Plan Note (Signed)
Increased and uncontrolled, topical prep prescribed and she is to use ES tylenol once to twice daily

## 2017-08-06 NOTE — Assessment & Plan Note (Signed)
Hyperlipidemia:Low fat diet discussed and encouraged.   Lipid Panel  Lab Results  Component Value Date   CHOL 157 02/06/2017   HDL 56 02/06/2017   LDLCALC 80 02/06/2017   TRIG 103 02/06/2017   CHOLHDL 2.8 02/06/2017   Updated lab needed at/ before next visit. Controlled, no change in medication

## 2017-08-10 ENCOUNTER — Other Ambulatory Visit: Payer: Self-pay | Admitting: Family Medicine

## 2017-09-29 ENCOUNTER — Other Ambulatory Visit: Payer: Self-pay | Admitting: Cardiology

## 2017-10-10 NOTE — Progress Notes (Signed)
Cardiology Office Note  Date: 10/11/2017   ID: [name] [name], [name] [name] [birth year], MRN [id number]  PCP: Fayrene Helper, MD  Primary Cardiologist: Rozann Lesches, MD   Chief Complaint  Patient presents with  . Aortic valve disease    History of Present Illness: [name] [name] is a 72 y.o. female last seen in August 2018.  She presents for a routine follow-up visit.  She does not report any progressive shortness of breath, has had no exertional chest pain, palpitations, or syncope.  She is limited by arthritic pain particularly in her knees.  She uses a cane.  Denies any falls.  Follow-up echocardiogram from August 2018 revealed LVEF 20-25% with stable aortic prosthesis.  She has declined ICD.  I reviewed her current medications.  Cardiac regimen includes aspirin, Coreg, Zetia, TriCor, Lasix, Livalo, Aldactone, and potassium supplements.  She is not on ARB in the setting of renal insufficiency.  Reviewed her lab work from December 2018 which is outlined below.  Past Medical History:  Diagnosis Date  . Anxiety   . Aortic stenosis   . Arthritis   . Chronic systolic heart failure ([place])   . CKD (chronic kidney disease) stage 3, GFR 30-59 ml/min (HCC)   . Constipation   . Depression   . Diabetes mellitus, type 2 ([place])   . Essential hypertension, benign   . Hyperlipidemia   . Incidental pulmonary nodule, > 21mm and < 46mm    7 x 4 mm LLL nodule  . LBBB (left bundle branch block)   . Nonischemic cardiomyopathy (HCC)    No significant obstructive CAD at heart catheterization 05/2014, LVEF 20%  . Obesity   . Pneumonia 2013  . 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 Symptomatic PVCs     Past Surgical History:  Procedure Laterality Date  . CARDIAC CATHETERIZATION    . CESAREAN SECTION  1987  . FLEXIBLE BRONCHOSCOPY N/A 01/14/2015   Procedure: FLEXIBLE  BRONCHOSCOPY;  Surgeon: [name], MD;  Location: [place];  Service: Thoracic;  Laterality: N/A;  . LEFT AND RIGHT HEART CATHETERIZATION WITH CORONARY ANGIOGRAM N/A 12/05/2011   Procedure: LEFT AND RIGHT HEART CATHETERIZATION WITH CORONARY ANGIOGRAM;  Surgeon: [name], MD;  [facility] C[facility] LAB;  Service: Cardiovascular;  Laterality: N/A;  . LEFT AND RIGHT HEART CATHETERIZATION WITH CORONARY ANGIOGRAM N/A 05/23/2014   Procedure: LEFT AND RIGHT HEART CATHETERIZATION WITH CORONARY ANGIOGRAM;  Surgeon: [name], MD;  Location: [facility] CATH LAB;  Service: Cardiovascular;  Laterality: N/A;  . MULTIPLE EXTRACTIONS WITH ALVEOLOPLASTY N/A 02/27/2014   Procedure: Extraction of tooth #'s 2,4,5,6,7,8,9,10,11,12, 22, 23, 24, 25, 26, 28 with alveoloplasty and bilateral mandibular tori reductions;  Surgeon: [name], DDS;  Location: [place];  Service: Oral Surgery;  Laterality: N/A;  . RIGID BRONCHOSCOPY N/A 01/14/2015   Procedure: RIGID BRONCHOSCOPY with Removal Of Foreign Body ;  Surgeon: [name], MD;  Location: [place];  Service: Thoracic;  Laterality: N/A;  . TEE WITHOUT CARDIOVERSION N/A 03/24/2015   Procedure: TRANSESOPHAGEAL ECHOCARDIOGRAM (TEE);  Surgeon: [name], MD;  Location: [place];  Service: Open Heart Surgery;  Laterality: N/A;  . TRANSCATHETER AORTIC VALVE REPLACEMENT, TRANSFEMORAL Bilateral 03/24/2015   Procedure: TRANSCATHETER AORTIC VALVE REPLACEMENT, TRANSFEMORAL;  Surgeon: [name], MD;  Location: [place];  Service: Open Heart Surgery;  Laterality: Bilateral;  . TUBAL  LIGATION  1987  . VIDEO BRONCHOSCOPY Bilateral 06/05/2014   Procedure: VIDEO BRONCHOSCOPY WITHOUT FLUORO;  Surgeon: [name], MD;  [facility] ENDOSCOPY;  Service: Cardiopulmonary;  Laterality: Bilateral;    Current Outpatient Medications  Medication Sig Dispense Refill  . acetaminophen (TYLENOL) 500 MG tablet Take 1,000 mg by mouth every 6 (six)  hours as needed for moderate pain.    Marland Kitchen aspirin EC 81 MG tablet Take 81 mg by mouth daily.    . carvedilol (COREG) 25 MG tablet TAKE 1 TABLET BY MOUTH TWICE DAILY WITH MEALS. 60 tablet 3  . ezetimibe (ZETIA) 10 MG tablet Take 1 tablet (10 mg total) by mouth daily. 90 tablet 1  . fenofibrate (TRICOR) 145 MG tablet TAKE (1) TABLET BY MOUTH ONCE DAILY. 90 tablet 1  . furosemide (LASIX) 40 MG tablet TAKE 1 TABLET BY MOUTH EVERY MORNING. 90 tablet 1  . glipiZIDE (GLIPIZIDE XL) 5 MG 24 hr tablet Take 1 tablet (5 mg total) by mouth daily with breakfast. 90 tablet 1  . glucose blood (ONE TOUCH ULTRA TEST) test strip Use as instructed three times daily dx: e11.65 100 each 5  . lidocaine-prilocaine (EMLA) cream Apply 1-2 application topically 3 (three) times daily as needed.    Marland Kitchen LITETOUCH PEN NEEDLES 31G X 8 MM MISC USE AS NEEDED TO INJECT INSULIN. 100 each 5  . Multiple Vitamins-Minerals (MULTIVITAMIN PO) Take 1 tablet by mouth daily.    . niacin (NIASPAN) 500 MG CR tablet TAKE (1) TABLET BY MOUTH AT BEDTIME. 90 tablet 1  . NOVOLOG MIX 70/30 FLEXPEN (70-30) 100 UNIT/ML FlexPen INJECT 12 UNITS SUBCUTANEOUSLY TWICE DAILY. 30 mL 5  . ONETOUCH DELICA LANCETS 25E MISC Three times daily testing dx e11.65 100 each 5  . Pitavastatin Calcium (LIVALO) 2 MG TABS TAKE ONE TABLET BY MOUTH ONCE DAILY AFTER SUPPER. 90 tablet 1  . potassium chloride (K-DUR) 10 MEQ tablet TAKE ONE TABLET BY MOUTH DAILY. DO NOT TAKE IF NOT TAKING FUROSEMIDE. 90 tablet 1  . spironolactone (ALDACTONE) 25 MG tablet Take 1 tablet (25 mg total) by mouth daily. 90 tablet 1   No current facility-administered medications for this visit.    Allergies:  Crestor [rosuvastatin]; Tramadol; and Penicillins   Social History: The patient  reports that  has never smoked. she has never used smokeless tobacco. She reports that she does not drink alcohol or use drugs.   ROS:  Please see the history of present illness. Otherwise, complete review of  systems is positive for chronic arthritic pain in her knees.  All other systems are reviewed and negative.   Physical Exam: VS:  BP 110/70   Pulse 74   Ht 5\' 6"  (1.676 m)   Wt 186 lb 12.8 oz (84.7 kg)   SpO2 99% Comment: on room air  BMI 30.15 kg/m , BMI Body mass index is 30.15 kg/m.  Wt Readings from Last 3 Encounters:  10/11/17 186 lb 12.8 oz (84.7 kg)  07/31/17 184 lb (83.5 kg)  04/06/17 181 lb (82.1 kg)    General: Elderly woman, appears comfortable at rest. HEENT: Conjunctiva and lids normal, oropharynx clear. Neck: Supple, no elevated JVP or carotid bruits, no thyromegaly. Lungs: Clear to auscultation, nonlabored breathing at rest. Cardiac: Regular rate and rhythm, no S3, 5-2/7 systolic murmur. Abdomen: Soft, nontender, bowel sounds present. Extremities: Mild ankle edema, distal pulses 2+. Skin: Warm and dry. Musculoskeletal: No kyphosis. Neuropsychiatric: Alert and oriented x3, affect grossly appropriate.  ECG: I personally  reviewed the tracing from 04/06/2017 which showed sinus rhythm with left bundle branch block.  Recent Labwork: 07/31/2017: ALT 19; AST 22; BUN 32; Creat 1.71; Potassium 3.8; Sodium 140     Component Value Date/Time   CHOL 157 02/06/2017 0916   TRIG 103 02/06/2017 0916   HDL 56 02/06/2017 0916   CHOLHDL 2.8 02/06/2017 0916   VLDL 21 02/06/2017 0916   LDLCALC 80 02/06/2017 0916    Other Studies Reviewed Today:  Echocardiogram 04/03/2017: Study Conclusions  - Left ventricle: The cavity size was mildly dilated. Systolic   function was severely reduced. The estimated ejection fraction   was in the range of 20% to 25%. Diffuse hypokinesis. The study is   not technically sufficient to allow evaluation of LV diastolic   function. - Ventricular septum: Septal motion showed abnormal function and   dyssynergy. - Aortic valve: 23 mm Edwards Sapien 3 in aortic position. No   obvious perivalvular leak. There was no significant   regurgitation. Mean  gradient (S): 19 mm Hg. - Mitral valve: Mildly calcified annulus. There was mild   regurgitation. - Atrial septum: No defect or patent foramen ovale was identified. - Tricuspid valve: There was trivial regurgitation. - Pulmonary arteries: PA peak pressure: 36 mm Hg (S). - Pericardium, extracardiac: There was no pericardial effusion.  Impressions:  - Mildly dilated left ventricle with mild LVH and LVEF 20-25%.   There is diffuse hypokinesis with septal dyssynergy.   Indeterminate diastolic function. Mildly calcified mitral annulus   with mild mitral regurgitation. 23 mm Edwards Sapien 3 in aortic   position without perivalvular leak or aortic regurgitation. Mean   gradient suggests adequate function overall. Trivial tricuspid   regurgitation with PASP 36 mmHg.  Assessment and Plan:  1.  Aortic stenosis status post TAVR in August 2016.  Valve function stable by echocardiogram last year.  2.  Nonischemic cardiomyopathy with LVEF approximately 20-25%.  She remains stable on medical therapy without volume overload.  She has declined ICD.  Not on ARB with renal insufficiency.  3.  CKD stage III, creatinine 1.7-1.8.  4.  Hyperlipidemia, on multimodal therapy per Dr. Moshe Cipro.  Last LDL 80.  Current medicines were reviewed with the patient today.  Disposition: Follow-up in 6 months.  Signed, [name], MD, [facility] 10/11/2017 1:23 PM    [facility] at [facility] [address], [place], [place] [address] Phone: [phone]; Fax: [phone]

## 2017-10-11 ENCOUNTER — Encounter: Payer: Self-pay | Admitting: Cardiology

## 2017-10-11 ENCOUNTER — Ambulatory Visit: Payer: PPO | Admitting: Cardiology

## 2017-10-11 ENCOUNTER — Other Ambulatory Visit: Payer: Self-pay

## 2017-10-11 VITALS — BP 110/70 | HR 74 | Ht 66.0 in | Wt 186.8 lb

## 2017-10-11 DIAGNOSIS — N183 Chronic kidney disease, stage 3 unspecified: Secondary | ICD-10-CM

## 2017-10-11 DIAGNOSIS — Z952 Presence of prosthetic heart valve: Secondary | ICD-10-CM

## 2017-10-11 DIAGNOSIS — I428 Other cardiomyopathies: Secondary | ICD-10-CM

## 2017-10-11 DIAGNOSIS — E782 Mixed hyperlipidemia: Secondary | ICD-10-CM

## 2017-10-11 NOTE — Patient Instructions (Signed)

## 2017-10-20 ENCOUNTER — Other Ambulatory Visit: Payer: Self-pay | Admitting: Family Medicine

## 2017-10-30 ENCOUNTER — Other Ambulatory Visit: Payer: Self-pay | Admitting: Family Medicine

## 2017-11-02 ENCOUNTER — Other Ambulatory Visit: Payer: Self-pay | Admitting: Family Medicine

## 2017-11-02 DIAGNOSIS — Z1239 Encounter for other screening for malignant neoplasm of breast: Secondary | ICD-10-CM

## 2017-11-10 ENCOUNTER — Ambulatory Visit (HOSPITAL_COMMUNITY): Payer: PPO

## 2017-11-16 ENCOUNTER — Other Ambulatory Visit: Payer: Self-pay | Admitting: Family Medicine

## 2017-12-05 ENCOUNTER — Telehealth: Payer: Self-pay

## 2017-12-05 NOTE — Telephone Encounter (Signed)
Called and left message on machine requesting a medication be sent in for allergies and also a refill on an inhaler.

## 2017-12-06 ENCOUNTER — Other Ambulatory Visit: Payer: Self-pay | Admitting: Family Medicine

## 2017-12-06 MED ORDER — ALBUTEROL SULFATE HFA 108 (90 BASE) MCG/ACT IN AERS: 2.0000 | INHALATION_SPRAY | Freq: Four times a day (QID) | RESPIRATORY_TRACT | 0 refills | Status: DC | PRN

## 2017-12-06 MED ORDER — FLUTICASONE PROPIONATE 50 MCG/ACT NA SUSP: 2.0000 | Freq: Every day | NASAL | 6 refills | Status: DC

## 2017-12-06 NOTE — Telephone Encounter (Signed)
meds are sen in to [facility]

## 2017-12-15 ENCOUNTER — Other Ambulatory Visit: Payer: Self-pay | Admitting: Family Medicine

## 2017-12-25 ENCOUNTER — Other Ambulatory Visit: Payer: Self-pay

## 2017-12-25 MED ORDER — FENOFIBRATE 145 MG PO TABS: ORAL_TABLET | ORAL | 5 refills | Status: DC

## 2017-12-25 MED ORDER — FUROSEMIDE 40 MG PO TABS: 40.0000 mg | ORAL_TABLET | Freq: Every morning | ORAL | 5 refills | Status: DC

## 2017-12-25 MED ORDER — GLIPIZIDE ER 5 MG PO TB24: 5.0000 mg | ORAL_TABLET | Freq: Every day | ORAL | 1 refills | Status: DC

## 2018-01-23 ENCOUNTER — Other Ambulatory Visit: Payer: Self-pay | Admitting: Family Medicine

## 2018-01-26 ENCOUNTER — Other Ambulatory Visit: Payer: Self-pay | Admitting: Cardiology

## 2018-02-01 ENCOUNTER — Ambulatory Visit (HOSPITAL_COMMUNITY): Payer: PPO

## 2018-02-01 ENCOUNTER — Ambulatory Visit (HOSPITAL_COMMUNITY)
Admission: RE | Admit: 2018-02-01 | Discharge: 2018-02-01 | Disposition: A | Discharge status: Home / Self Care | Payer: PPO | Source: Ambulatory Visit | Attending: Family Medicine | Admitting: Family Medicine

## 2018-02-01 ENCOUNTER — Ambulatory Visit (INDEPENDENT_AMBULATORY_CARE_PROVIDER_SITE_OTHER): Payer: PPO

## 2018-02-01 VITALS — BP 124/70 | HR 76 | Temp 97.9°F | Resp 16 | Ht 66.0 in | Wt 186.0 lb

## 2018-02-01 DIAGNOSIS — Z1231 Encounter for screening mammogram for malignant neoplasm of breast: Secondary | ICD-10-CM | POA: Diagnosis not present

## 2018-02-01 DIAGNOSIS — Z0000 Encounter for general adult medical examination without abnormal findings: Secondary | ICD-10-CM | POA: Diagnosis not present

## 2018-02-01 DIAGNOSIS — Z13820 Encounter for screening for osteoporosis: Secondary | ICD-10-CM | POA: Diagnosis not present

## 2018-02-01 DIAGNOSIS — Z1239 Encounter for other screening for malignant neoplasm of breast: Secondary | ICD-10-CM

## 2018-02-01 NOTE — Patient Instructions (Signed)
[name] [name] , Thank you for taking time to come for your Medicare Wellness Visit. I appreciate your ongoing commitment to your health goals. Please review the following plan we discussed and let me know if I can assist you in the future.   Screening recommendations/referrals: Colonoscopy: You don't want to have this done anymore Mammogram: You're having this done today Bone Density: We will schedule this when you come in to see Dr.Simpson on 6/27. The order is already in. Recommended yearly ophthalmology/optometry visit for glaucoma screening and checkup Recommended yearly dental visit for hygiene and checkup  Vaccinations: Influenza vaccine: Due Fall 2019 Pneumococcal vaccine: UTD Tdap vaccine: UTD Shingles vaccine: UTD    Advanced directives: I will give you paperwork today. Look over these and complete them. Once you have filled them out, have them notarized.   Conditions/risks identified: Discussed during your visit.   Next appointment: 02/08/2018 at 3:20pm with Dr.Simpson   Preventive Care 39 Years and Older, Female Preventive care refers to lifestyle choices and visits with your health care provider that can promote health and wellness. What does preventive care include?  A yearly physical exam. This is also called an annual well check.  Dental exams once or twice a year.  Routine eye exams. Ask your health care provider how often you should have your eyes checked.  Personal lifestyle choices, including:  Daily care of your teeth and gums.  Regular physical activity.  Eating a healthy diet.  Avoiding tobacco and drug use.  Limiting alcohol use.  Practicing safe sex.  Taking low-dose aspirin every day.  Taking vitamin and mineral supplements as recommended by your health care provider. What happens during an annual well check? The services and screenings done by your health care provider during your annual well check will depend on your age, overall health,  lifestyle risk factors, and family history of disease. Counseling  Your health care provider may ask you questions about your:  Alcohol use.  Tobacco use.  Drug use.  Emotional well-being.  Home and relationship well-being.  Sexual activity.  Eating habits.  History of falls.  Memory and ability to understand (cognition).  Work and work Statistician.  Reproductive health. Screening  You may have the following tests or measurements:  Height, weight, and BMI.  Blood pressure.  Lipid and cholesterol levels. These may be checked every 5 years, or more frequently if you are over 19 years old.  Skin check.  Lung cancer screening. You may have this screening every year starting at age 73 if you have a 30-pack-year history of smoking and currently smoke or have quit within the past 15 years.  Fecal occult blood test (FOBT) of the stool. You may have this test every year starting at age 73.  Flexible sigmoidoscopy or colonoscopy. You may have a sigmoidoscopy every 5 years or a colonoscopy every 10 years starting at age 73.  Hepatitis C blood test.  Hepatitis B blood test.  Sexually transmitted disease (STD) testing.  Diabetes screening. This is done by checking your blood sugar (glucose) after you have not eaten for a while (fasting). You may have this done every 1-3 years.  Bone density scan. This is done to screen for osteoporosis. You may have this done starting at age 73.  Mammogram. This may be done every 1-2 years. Talk to your health care provider about how often you should have regular mammograms. Talk with your health care provider about your test results, treatment options, and if necessary, the  need for more tests. Vaccines  Your health care provider may recommend certain vaccines, such as:  Influenza vaccine. This is recommended every year.  Tetanus, diphtheria, and acellular pertussis (Tdap, Td) vaccine. You may need a Td booster every 10 years.  Zoster  vaccine. You may need this after age 73.  Pneumococcal 13-valent conjugate (PCV13) vaccine. One dose is recommended after age 39.  Pneumococcal polysaccharide (PPSV23) vaccine. One dose is recommended after age 73. Talk to your health care provider about which screenings and vaccines you need and how often you need them. This information is not intended to replace advice given to you by your health care provider. Make sure you discuss any questions you have with your health care provider. Document Released: 08/28/2015 Document Revised: 04/20/2016 Document Reviewed: 06/02/2015 Elsevier Interactive Patient Education  2017 [place] Prevention in the Home Falls can cause injuries. They can happen to people of all ages. There are many things you can do to make your home safe and to help prevent falls. What can I do on the outside of my home?  Regularly fix the edges of walkways and driveways and fix any cracks.  Remove anything that might make you trip as you walk through a door, such as a raised step or threshold.  Trim any bushes or trees on the path to your home.  Use bright outdoor lighting.  Clear any walking paths of anything that might make someone trip, such as rocks or tools.  Regularly check to see if handrails are loose or broken. Make sure that both sides of any steps have handrails.  Any raised decks and porches should have guardrails on the edges.  Have any leaves, snow, or ice cleared regularly.  Use sand or salt on walking paths during winter.  Clean up any spills in your garage right away. This includes oil or grease spills. What can I do in the bathroom?  Use night lights.  Install grab bars by the toilet and in the tub and shower. Do not use towel bars as grab bars.  Use non-skid mats or decals in the tub or shower.  If you need to sit down in the shower, use a plastic, non-slip stool.  Keep the floor dry. Clean up any water that spills on the  floor as soon as it happens.  Remove soap buildup in the tub or shower regularly.  Attach bath mats securely with double-sided non-slip rug tape.  Do not have throw rugs and other things on the floor that can make you trip. What can I do in the bedroom?  Use night lights.  Make sure that you have a light by your bed that is easy to reach.  Do not use any sheets or blankets that are too big for your bed. They should not hang down onto the floor.  Have a firm chair that has side arms. You can use this for support while you get dressed.  Do not have throw rugs and other things on the floor that can make you trip. What can I do in the kitchen?  Clean up any spills right away.  Avoid walking on wet floors.  Keep items that you use a lot in easy-to-reach places.  If you need to reach something above you, use a strong step stool that has a grab bar.  Keep electrical cords out of the way.  Do not use floor polish or wax that makes floors slippery. If you must use wax,  use non-skid floor wax.  Do not have throw rugs and other things on the floor that can make you trip. What can I do with my stairs?  Do not leave any items on the stairs.  Make sure that there are handrails on both sides of the stairs and use them. Fix handrails that are broken or loose. Make sure that handrails are as long as the stairways.  Check any carpeting to make sure that it is firmly attached to the stairs. Fix any carpet that is loose or worn.  Avoid having throw rugs at the top or bottom of the stairs. If you do have throw rugs, attach them to the floor with carpet tape.  Make sure that you have a light switch at the top of the stairs and the bottom of the stairs. If you do not have them, ask someone to add them for you. What else can I do to help prevent falls?  Wear shoes that:  Do not have high heels.  Have rubber bottoms.  Are comfortable and fit you well.  Are closed at the toe. Do not wear  sandals.  If you use a stepladder:  Make sure that it is fully opened. Do not climb a closed stepladder.  Make sure that both sides of the stepladder are locked into place.  Ask someone to hold it for you, if possible.  Clearly mark and make sure that you can see:  Any grab bars or handrails.  First and last steps.  Where the edge of each step is.  Use tools that help you move around (mobility aids) if they are needed. These include:  Canes.  Walkers.  Scooters.  Crutches.  Turn on the lights when you go into a dark area. Replace any light bulbs as soon as they burn out.  Set up your furniture so you have a clear path. Avoid moving your furniture around.  If any of your floors are uneven, fix them.  If there are any pets around you, be aware of where they are.  Review your medicines with your doctor. Some medicines can make you feel dizzy. This can increase your chance of falling. Ask your doctor what other things that you can do to help prevent falls. This information is not intended to replace advice given to you by your health care provider. Make sure you discuss any questions you have with your health care provider. Document Released: 05/28/2009 Document Revised: 01/07/2016 Document Reviewed: 09/05/2014 Elsevier Interactive Patient Education  2017 [employer].

## 2018-02-01 NOTE — Progress Notes (Signed)
Subjective:   [name] [name] is a 72 y.o. female who presents for Medicare Annual (Subsequent) preventive examination.  Review of Systems:          Objective:     Vitals: There were no vitals taken for this visit.  There is no height or weight on file to calculate BMI.  Advanced Directives 09/29/2016 05/17/2016 01/01/2016 05/06/2015 03/25/2015 03/20/2015 03/17/2015  Does Patient Have a Medical Advance Directive? No No No No No No No  Copy of Healthcare Power of Attorney in Chart? - - - - - - -  Would patient like information on creating a medical advance directive? Yes (MAU/Ambulatory/Procedural Areas - Information given) No - patient declined information No - patient declined information No - patient declined information No - patient declined information - No - patient declined information  Pre-existing out of facility DNR order (yellow form or pink MOST form) - - - - - - -    Tobacco Social History   Tobacco Use  Smoking Status Never Smoker  Smokeless Tobacco Never Used     Counseling given: Not Answered   Clinical Intake:                       Past Medical History:  Diagnosis Date  . Anxiety   . Aortic stenosis   . Arthritis   . Chronic systolic heart failure ([place])   . CKD (chronic kidney disease) stage 3, GFR 30-59 ml/min (H[facility])   . Constipation   . Depression   . Diabetes mellitus, type 2 ([place])   . Essential hypertension, benign   . Hyperlipidemia   . Incidental pulmonary nodule, > 32mm and < 33mm    7 x 4 mm LLL nodule  . LBBB (left bundle branch block)   . Nonischemic cardiomyopathy (HCC)    No significant obstructive CAD at heart catheterization 05/2014, LVEF 20%  . Obesity   . Pneumonia 2013  . 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 Symptomatic PVCs    Past Surgical History:  Procedure Laterality Date  . CARDIAC CATHETERIZATION     . CESAREAN SECTION  1987  . FLEXIBLE BRONCHOSCOPY N/A 01/14/2015   Procedure: FLEXIBLE BRONCHOSCOPY;  Surgeon: [name], MD;  Location: [facility];  Service: Thoracic;  Laterality: N/A;  . LEFT AND RIGHT HEART CATHETERIZATION WITH CORONARY ANGIOGRAM N/A 12/05/2011   Procedure: LEFT AND RIGHT HEART CATHETERIZATION WITH CORONARY ANGIOGRAM;  Surgeon: [name], MD;  Location: [facility] CATH LAB;  Service: Cardiovascular;  Laterality: N/A;  . LEFT AND RIGHT HEART CATHETERIZATION WITH CORONARY ANGIOGRAM N/A 05/23/2014   Procedure: LEFT AND RIGHT HEART CATHETERIZATION WITH CORONARY ANGIOGRAM;  Surgeon: [name], MD;  Location: [facility] CATH LAB;  Service: Cardiovascular;  Laterality: N/A;  . MULTIPLE EXTRACTIONS WITH ALVEOLOPLASTY N/A 02/27/2014   Procedure: Extraction of tooth #'s 2,4,5,6,7,8,9,10,11,12, 22, 23, 24, 25, 26, 28 with alveoloplasty and bilateral mandibular tori reductions;  Surgeon: [name], DDS;  Location: [place];  Service: Oral Surgery;  Laterality: N/A;  . RIGID BRONCHOSCOPY N/A 01/14/2015   Procedure: RIGID BRONCHOSCOPY with Removal Of Foreign Body ;  Surgeon: [name], MD;  Location: [place];  Service: Thoracic;  Laterality: N/A;  . TEE WITHOUT CARDIOVERSION N/A 03/24/2015   Procedure: TRANSESOPHAGEAL ECHOCARDIOGRAM (TEE);  Surgeon: [name], MD;  Location: [place];  Service: Open  Heart Surgery;  Laterality: N/A;  . TRANSCATHETER AORTIC VALVE REPLACEMENT, TRANSFEMORAL Bilateral 03/24/2015   Procedure: TRANSCATHETER AORTIC VALVE REPLACEMENT, TRANSFEMORAL;  Surgeon: [name], MD;  Location: [place];  Service: Open Heart Surgery;  Laterality: Bilateral;  . TUBAL LIGATION  1987  . VIDEO BRONCHOSCOPY Bilateral 06/05/2014   Procedure: VIDEO BRONCHOSCOPY WITHOUT FLUORO;  Surgeon: [name], MD;  Location: [facility] ENDOSCOPY;  Service: Cardiopulmonary;  Laterality: Bilateral;   Family History  Problem Relation Age of Onset  . Heart  disease Mother   . Heart attack Mother 43  . Diabetes Mother   . Hypertension Mother   . Colon cancer Father 65  . Breast cancer Sister   . Diabetes Brother   . Hypertension Brother   . Stroke Brother   . Cancer Brother   . Congestive Heart Failure Daughter   . Hypertension Son   . High Cholesterol Son   . Congestive Heart Failure Son    Social History   Socioeconomic History  . Marital status: Married    Spouse name: Not on file  . Number of children: 5  . Years of education: Not on file  . Highest education level: Not on file  Occupational History  . Occupation: Retired  Scientific laboratory technician  . Financial resource strain: Not on file  . Food insecurity:    Worry: Not on file    Inability: Not on file  . Transportation needs:    Medical: Not on file    Non-medical: Not on file  Tobacco Use  . Smoking status: Never Smoker  . Smokeless tobacco: Never Used  Substance and Sexual Activity  . Alcohol use: No  . Drug use: No  . Sexual activity: Never    Birth control/protection: None, Post-menopausal  Lifestyle  . Physical activity:    Days per week: Not on file    Minutes per session: Not on file  . Stress: Not on file  Relationships  . Social connections:    Talks on phone: Not on file    Gets together: Not on file    Attends religious service: Not on file    Active member of club or organization: Not on file    Attends meetings of clubs or organizations: Not on file    Relationship status: Not on file  Other Topics Concern  . Not on file  Social History Narrative  . Not on file    Outpatient Encounter Medications as of 02/01/2018  Medication Sig  . acetaminophen (TYLENOL) 500 MG tablet Take 1,000 mg by mouth every 6 (six) hours as needed for moderate pain.  Marland Kitchen albuterol (PROVENTIL HFA;VENTOLIN HFA) 108 (90 Base) MCG/ACT inhaler Inhale 2 puffs into the lungs every 6 (six) hours as needed for wheezing or shortness of breath.  Marland Kitchen aspirin EC 81 MG tablet Take 81 mg by  mouth daily.  . carvedilol (COREG) 25 MG tablet TAKE 1 TABLET BY MOUTH TWICE DAILY WITH MEALS.  Marland Kitchen ezetimibe (ZETIA) 10 MG tablet TAKE ONE TABLET BY MOUTH ONCE DAILY.  . fenofibrate (TRICOR) 145 MG tablet TAKE (1) TABLET BY MOUTH ONCE DAILY.  . fluticasone (FLONASE) 50 MCG/ACT nasal spray Place 2 sprays into both nostrils daily.  . furosemide (LASIX) 40 MG tablet Take 1 tablet (40 mg total) by mouth every morning.  Marland Kitchen glipiZIDE (GLIPIZIDE XL) 5 MG 24 hr tablet Take 1 tablet (5 mg total) by mouth daily with breakfast.  . glucose blood (ONE TOUCH ULTRA TEST) test strip Use  as instructed three times daily dx: e11.65  . lidocaine-prilocaine (EMLA) cream Apply 1-2 application topically 3 (three) times daily as needed.  Marland Kitchen LITETOUCH PEN NEEDLES 31G X 8 MM MISC USE AS NEEDED TO INJECT INSULIN.  . Multiple Vitamins-Minerals (MULTIVITAMIN PO) Take 1 tablet by mouth daily.  . niacin (NIASPAN) 500 MG CR tablet TAKE (1) TABLET BY MOUTH AT BEDTIME.  Marland Kitchen NOVOLOG MIX 70/30 FLEXPEN (70-30) 100 UNIT/ML FlexPen INJECT 12 UNITS SUBCUTANEOUSLY TWICE DAILY.  . ONE TOUCH ULTRA TEST test strip CHECK BLOOD SUGAR 3 TIMES A DAY.  Glory Rosebush DELICA LANCETS 67Y MISC Three times daily testing dx e11.65  . Pitavastatin Calcium (LIVALO) 2 MG TABS TAKE ONE TABLET BY MOUTH ONCE DAILY AFTER SUPPER.  Marland Kitchen potassium chloride (K-DUR) 10 MEQ tablet TAKE ONE TABLET BY MOUTH DAILY. DO NOT TAKE IF NOT TAKING FUROSEMIDE.  Marland Kitchen spironolactone (ALDACTONE) 25 MG tablet TAKE 1 TABLET BY MOUTH ONCE DAILY.  . [DISCONTINUED] cetirizine (ZYRTEC) 10 MG tablet Take 1 tablet (10 mg total) by mouth daily.  . [DISCONTINUED] ferrous sulfate 325 (65 FE) MG EC tablet Take 1 tablet (325 mg total) by mouth 2 (two) times daily.  . [DISCONTINUED] FLUoxetine (PROZAC) 10 MG tablet Take 1 tablet (10 mg total) by mouth daily. Take one capsule by mouth once a day   No facility-administered encounter medications on file as of 02/01/2018.     Activities of Daily  Living No flowsheet data found.  Patient Care Team: Fayrene Helper, MD as PCP - General Evans Lance, MD (Cardiology) [name], MD as Consulting Physician (Cardiology)    Assessment:   This is a routine wellness examination for [facility].  Exercise Activities and Dietary recommendations    Goals    . Exercise 3x per week (30 min per time)     Patient would like to increase her exercise to 4 days a weeks for 20-30 minutes at a time.        Fall Risk Fall Risk  12/14/2016 09/29/2016 09/29/2016 05/06/2015 04/23/2014  Falls in the past year? No No No No No   Is the patient's home free of loose throw rugs in walkways, pet beds, electrical cords, etc?   no      Grab bars in the bathroom? no      Handrails on the stairs?   no      Adequate lighting?   yes  Timed Get Up and Go performed: No  Depression Screen PHQ 2/9 Scores 12/14/2016 09/29/2016 09/29/2016 10/12/2015  PHQ - 2 Score 0 1 0 2  PHQ- 9 Score - - - -     Cognitive Function     6CIT Screen 09/29/2016  What Year? 0 points  What month? 0 points  What time? 0 points  Count back from 20 0 points  Months in reverse 0 points  Repeat phrase 0 points  Total Score 0    Immunization History  Administered Date(s) Administered  . Influenza Split 06/30/2011  . Influenza Whole 09/18/2007, 05/28/2009, 06/15/2010  . Influenza,inj,Quad PF,6+ Mos 05/21/2013, 04/23/2014, 08/18/2015, 06/23/2016, 07/31/2017  . Influenza-Unspecified 04/15/2014  . Pneumococcal Conjugate-13 09/16/2014  . Pneumococcal Polysaccharide-23 05/24/2004, 12/20/2010  . Td 05/24/2004  . Tdap 06/30/2011    Qualifies for Shingles Vaccine?UTD  Screening Tests Health Maintenance  Topic Date Due  . COLONOSCOPY  06/17/1995  . DEXA SCAN  06/16/2010  . URINE MICROALBUMIN  03/29/2017  . OPHTHALMOLOGY EXAM  05/31/2017  . MAMMOGRAM  10/20/2017  .  HEMOGLOBIN A1C  01/29/2018  . INFLUENZA VACCINE  03/15/2018  . FOOT EXAM  07/31/2018  . TETANUS/TDAP   06/29/2021  . Hepatitis C Screening  Completed  . PNA vac Low Risk Adult  Completed    Cancer Screenings: Lung: Low Dose CT Chest recommended if Age 59-80 years, 30 pack-year currently smoking OR have quit w/in 15years. Patient does not qualify. Breast:  Up to date on Mammogram? Yes   Up to date of Bone Density/Dexa? No Colorectal: Patient doesn't want this done  Additional Screenings:  Hepatitis C Screening: Completed on 10/07/2015     Plan:      I have personally reviewed and noted the following in the patient's chart:   . Medical and social history . Use of alcohol, tobacco or illicit drugs  . Current medications and supplements . Functional ability and status . Nutritional status . Physical activity . Advanced directives . List of other physicians . Hospitalizations, surgeries, and ER visits in previous 12 months . Vitals . Screenings to include cognitive, depression, and falls . Referrals and appointments  In addition, I have reviewed and discussed with patient certain preventive protocols, quality metrics, and best practice recommendations. A written personalized care plan for preventive services as well as general preventive health recommendations were provided to patient.     Tod Persia, [place]  02/01/2018

## 2018-02-08 ENCOUNTER — Ambulatory Visit (INDEPENDENT_AMBULATORY_CARE_PROVIDER_SITE_OTHER): Payer: PPO | Admitting: Family Medicine

## 2018-02-08 ENCOUNTER — Other Ambulatory Visit: Payer: Self-pay

## 2018-02-08 ENCOUNTER — Encounter: Payer: Self-pay | Admitting: Family Medicine

## 2018-02-08 VITALS — BP 118/78 | HR 78 | Ht 66.0 in | Wt 183.1 lb

## 2018-02-08 DIAGNOSIS — Z1211 Encounter for screening for malignant neoplasm of colon: Secondary | ICD-10-CM | POA: Diagnosis not present

## 2018-02-08 DIAGNOSIS — Z794 Long term (current) use of insulin: Secondary | ICD-10-CM | POA: Diagnosis not present

## 2018-02-08 DIAGNOSIS — E119 Type 2 diabetes mellitus without complications: Secondary | ICD-10-CM

## 2018-02-08 DIAGNOSIS — Z1331 Encounter for screening for depression: Secondary | ICD-10-CM | POA: Diagnosis not present

## 2018-02-08 DIAGNOSIS — IMO0001 Reserved for inherently not codable concepts without codable children: Secondary | ICD-10-CM

## 2018-02-08 DIAGNOSIS — Z0000 Encounter for general adult medical examination without abnormal findings: Secondary | ICD-10-CM | POA: Diagnosis not present

## 2018-02-08 NOTE — Patient Instructions (Addendum)
Wellness past due , please schedule with nurse for September , she will get flu vaccine at that visit  MD follow up end November Please schedule her dexa at checkout if possible or arrange to call her with the appt  Needs to get fasting lipid, cmp and eGFR , hBA1c, cBC , TSH  And vit D as soon as possible , no later than next Monday please  microalb from office   You are referred for eye exam at my eye docrtor and for colonoscopy for end July  It is important that you exercise regularly at least 30 minutes 5 times a week. If you develop chest pain, have severe difficulty breathing, or feel very tired, stop exercising immediately and seek medical attention  Thank you  for choosing [facility]. We consider it a privelige to serve you.  Delivering excellent health care in a caring and  compassionate way is our goal.  Partnering with you,  so that together we can achieve this goal is our strategy.

## 2018-02-09 ENCOUNTER — Other Ambulatory Visit (HOSPITAL_COMMUNITY)
Admission: RE | Admit: 2018-02-09 | Discharge: 2018-02-09 | Disposition: A | Discharge status: Home / Self Care | Payer: PPO | Source: Other Acute Inpatient Hospital | Attending: Family Medicine | Admitting: Family Medicine

## 2018-02-09 ENCOUNTER — Other Ambulatory Visit: Payer: Self-pay | Admitting: Family Medicine

## 2018-02-09 DIAGNOSIS — Z794 Long term (current) use of insulin: Secondary | ICD-10-CM | POA: Diagnosis not present

## 2018-02-09 DIAGNOSIS — E119 Type 2 diabetes mellitus without complications: Secondary | ICD-10-CM | POA: Diagnosis not present

## 2018-02-10 ENCOUNTER — Encounter: Payer: Self-pay | Admitting: Family Medicine

## 2018-02-10 DIAGNOSIS — Z1331 Encounter for screening for depression: Secondary | ICD-10-CM | POA: Insufficient documentation

## 2018-02-10 LAB — MICROALBUMIN / CREATININE URINE RATIO
Creatinine, Urine: 51.1 mg/dL
Microalb Creat Ratio: <5.9 mg/g creat (ref 0.0–30.0)
Microalb, Ur: <3 ug/mL — ABNORMAL HIGH

## 2018-02-10 NOTE — Assessment & Plan Note (Signed)
[name] [name] is reminded of the importance of commitment to daily physical activity for 30 minutes or more, as able and the need to limit carbohydrate intake to 30 to 60 grams per meal to help with blood sugar control.   The need to take medication as prescribed, test blood sugar as directed, and to call between visits if there is a concern that blood sugar is uncontrolled is also discussed.   [name] [name] is reminded of the importance of daily foot exam, annual eye examination, and good blood sugar, blood pressure and cholesterol control. Updated lab is as due, needed  Diabetic Labs Latest Ref Rng & Units 02/09/2018 07/31/2017 02/06/2017 09/27/2016 06/22/2016  HbA1c <5.7 % of total Hgb - 7.0(H) 7.5(H) 7.6(H) 7.3(H)  Microalbumin Not Estab. ug/mL <3.0(H) - - - -  Micro/Creat Ratio 0.0 - 30.0 mg/g creat <5.9 - - - -  Chol <200 mg/dL - - 157 194 194  HDL >50 mg/dL - - 56 56 60  Calc LDL <100 mg/dL - - 80 119(H) 121(H)  Triglycerides <150 mg/dL - - 103 97 66  Creatinine 0.60 - 0.93 mg/dL - 1.71(H) 1.88(H) 2.14(H) 1.46(H)   BP/Weight 02/08/2018 02/01/2018 10/11/2017 07/31/2017 04/06/2017 03/15/2017 11/16/5246  Systolic BP 185 909 311 216 244 695 072  Diastolic BP 78 70 70 70 64 82 72  Wt. (Lbs) 183.08 186 186.8 184 181 184 180  BMI 29.55 30.02 30.15 29.7 29.21 30.62 29.05   Foot/eye exam completion dates Latest Ref Rng & Units 07/31/2017 05/31/2016  Eye Exam No Retinopathy - No Retinopathy  Foot exam Order - - -  Foot Form Completion - Done -

## 2018-02-10 NOTE — Assessment & Plan Note (Signed)

## 2018-02-10 NOTE — Progress Notes (Signed)
[name] [name]     MRN: [id number]      DOB: 04/24/1946  HPI: Patient is in for annual physical exam. No other health concerns are expressed or addressed at the visit. Recent labs, if available are reviewed. Immunization is reviewed , and  updated if needed.   PE: BP 118/78 (BP Location: Left Arm, Patient Position: Sitting, Cuff Size: Large)   Pulse 78   Ht 5\' 6"  (1.676 m)   Wt 183 lb 1.3 oz (83 kg)   SpO2 98%   BMI 29.55 kg/m   Pleasant  female, alert and oriented x 3, in no cardio-pulmonary distress. Afebrile. HEENT No facial trauma or asymetry. Sinuses non tender.  Extra occullar muscles intact,. External ears normal, tympanic membranes clear. Oropharynx moist, no exudate.Poor dentiotion Neck: supple, no adenopathy,JVD or thyromegaly.No bruits.  Chest: Clear to ascultation bilaterally.No crackles or wheezes. Non tender to palpation  Breast: No asymetry,no masses or lumps. No tenderness. No nipple discharge or inversion. No axillary or supraclavicular adenopathy  Cardiovascular system; Heart sounds normal,  S1 and  S2 ,no S3. Asystolic  murmur, or thrill. Apical beat not displaced Peripheral pulses normal.  Abdomen: Soft, non tender, no organomegaly or masses. No bruits. Bowel sounds normal. No guarding, tenderness or rebound.   GU: Asymptomatic, not examined  Musculoskeletal exam: Decreased  ROM of spine, hips , shoulders and knees.  deformity ,swelling and  crepitus noted.affecting knees No muscle wasting or atrophy.   Neurologic: Cranial nerves 2 to 12 intact. Power, tone ,sensation and reflexes normal throughout. No disturbance in gait. No tremor.  Skin: Intact, no ulceration, erythema , scaling or rash noted. Pigmentation normal throughout  Psych; Normal mood and affect. Judgement and concentration normal   Assessment & Plan:  Annual physical exam Annual exam as documented. Counseling done  re healthy lifestyle involving commitment to  150 minutes exercise per week, heart healthy diet, and attaining healthy weight.The importance of adequate sleep also discussed. Regular seat belt use and home safety, is also discussed. Changes in health habits are decided on by the patient with goals and time frames  set for achieving them. Immunization and cancer screening needs are specifically addressed at this visit.   Diabetes mellitus, insulin dependent (IDDM), controlled ([place]) Ms. Ashbaugh is reminded of the importance of commitment to daily physical activity for 30 minutes or more, as able and the need to limit carbohydrate intake to 30 to 60 grams per meal to help with blood sugar control.   The need to take medication as prescribed, test blood sugar as directed, and to call between visits if there is a concern that blood sugar is uncontrolled is also discussed.   Ms. Delk is reminded of the importance of daily foot exam, annual eye examination, and good blood sugar, blood pressure and cholesterol control. Updated lab is as due, needed  Diabetic Labs Latest Ref Rng & Units 02/09/2018 07/31/2017 02/06/2017 09/27/2016 06/22/2016  HbA1c <5.7 % of total Hgb - 7.0(H) 7.5(H) 7.6(H) 7.3(H)  Microalbumin Not Estab. ug/mL <3.0(H) - - - -  Micro/Creat Ratio 0.0 - 30.0 mg/g creat <5.9 - - - -  Chol <200 mg/dL - - 157 194 194  HDL >50 mg/dL - - 56 56 60  Calc LDL <100 mg/dL - - 80 119(H) 121(H)  Triglycerides <150 mg/dL - - 103 97 66  Creatinine 0.60 - 0.93 mg/dL - 1.71(H) 1.88(H) 2.14(H) 1.46(H)   BP/Weight 02/08/2018 02/01/2018 10/11/2017 07/31/2017 04/06/2017 03/15/2017 6/54/6503  Systolic  BP 118 124 110 120 859 276 394  Diastolic BP 78 70 70 70 64 82 72  Wt. (Lbs) 183.08 186 186.8 184 181 184 180  BMI 29.55 30.02 30.15 29.7 29.21 30.62 29.05   Foot/eye exam completion dates Latest Ref Rng & Units 07/31/2017 05/31/2016  Eye Exam No Retinopathy - No Retinopathy  Foot exam Order - - -  Foot Form Completion - Done -          Screening for depression Screenings for depression is completed and is negative

## 2018-02-10 NOTE — Assessment & Plan Note (Signed)
Screenings for depression is completed and is negative

## 2018-02-12 ENCOUNTER — Encounter (INDEPENDENT_AMBULATORY_CARE_PROVIDER_SITE_OTHER): Payer: Self-pay | Admitting: *Deleted

## 2018-02-12 ENCOUNTER — Telehealth: Payer: Self-pay

## 2018-02-12 DIAGNOSIS — E559 Vitamin D deficiency, unspecified: Secondary | ICD-10-CM

## 2018-02-12 DIAGNOSIS — E785 Hyperlipidemia, unspecified: Secondary | ICD-10-CM

## 2018-02-12 DIAGNOSIS — IMO0001 Reserved for inherently not codable concepts without codable children: Secondary | ICD-10-CM

## 2018-02-12 DIAGNOSIS — Z794 Long term (current) use of insulin: Secondary | ICD-10-CM

## 2018-02-12 DIAGNOSIS — E119 Type 2 diabetes mellitus without complications: Secondary | ICD-10-CM

## 2018-02-12 DIAGNOSIS — I10 Essential (primary) hypertension: Secondary | ICD-10-CM

## 2018-02-12 NOTE — Telephone Encounter (Signed)
Labs have been ordered

## 2018-02-12 NOTE — Telephone Encounter (Signed)
Please DO order the labs, they were not ordered, thanks!

## 2018-02-12 NOTE — Telephone Encounter (Signed)
Spoke with patient and discussed that she has not had labs drawn that were ordered at her visit last week and these are past due. She verbalized understanding and stated she would get these drawn tomorrow morning.

## 2018-02-13 ENCOUNTER — Encounter (INDEPENDENT_AMBULATORY_CARE_PROVIDER_SITE_OTHER): Payer: Self-pay | Admitting: *Deleted

## 2018-02-13 DIAGNOSIS — E119 Type 2 diabetes mellitus without complications: Secondary | ICD-10-CM | POA: Diagnosis not present

## 2018-02-13 DIAGNOSIS — E785 Hyperlipidemia, unspecified: Secondary | ICD-10-CM | POA: Diagnosis not present

## 2018-02-13 DIAGNOSIS — E559 Vitamin D deficiency, unspecified: Secondary | ICD-10-CM | POA: Diagnosis not present

## 2018-02-13 DIAGNOSIS — I10 Essential (primary) hypertension: Secondary | ICD-10-CM | POA: Diagnosis not present

## 2018-02-14 LAB — LIPID PANEL
Cholesterol: 164 mg/dL (ref <200)
HDL: 59 mg/dL (ref >50)
LDL Cholesterol (Calc): 88 mg/dL (calc)
Non-HDL Cholesterol (Calc): 105 mg/dL (calc) (ref <130)
Total CHOL/HDL Ratio: 2.8 (calc) (ref <5.0)
Triglycerides: 84 mg/dL (ref <150)

## 2018-02-14 LAB — COMPLETE METABOLIC PANEL WITH GFR
AG Ratio: 1.6 (calc) (ref 1.0–2.5)
ALT: 16 U/L (ref 6–29)
AST: 21 U/L (ref 10–35)
Albumin: 4.4 g/dL (ref 3.6–5.1)
Alkaline phosphatase (APISO): 36 U/L (ref 33–130)
BUN/Creatinine Ratio: 18 (calc) (ref 6–22)
BUN: 33 mg/dL — ABNORMAL HIGH (ref 7–25)
CO2: 27 mmol/L (ref 20–32)
Calcium: 10.1 mg/dL (ref 8.6–10.4)
Chloride: 107 mmol/L (ref 98–110)
Creat: 1.81 mg/dL — ABNORMAL HIGH (ref 0.60–0.93)
GFR, Est African American: 32 mL/min/{1.73_m2} — ABNORMAL LOW (ref >=60)
GFR, Est Non African American: 27 mL/min/{1.73_m2} — ABNORMAL LOW (ref >=60)
Globulin: 2.7 g/dL (calc) (ref 1.9–3.7)
Glucose, Bld: 168 mg/dL — ABNORMAL HIGH (ref 65–99)
Potassium: 4.4 mmol/L (ref 3.5–5.3)
Sodium: 143 mmol/L (ref 135–146)
Total Bilirubin: 0.4 mg/dL (ref 0.2–1.2)
Total Protein: 7.1 g/dL (ref 6.1–8.1)

## 2018-02-14 LAB — HEMOGLOBIN A1C
Hgb A1c MFr Bld: 7.4 % of total Hgb — ABNORMAL HIGH (ref <5.7)
Mean Plasma Glucose: 166 (calc)
eAG (mmol/L): 9.2 (calc)

## 2018-02-14 LAB — CBC
HCT: 38.5 % (ref 35.0–45.0)
Hemoglobin: 12.4 g/dL (ref 11.7–15.5)
MCH: 26.9 pg — ABNORMAL LOW (ref 27.0–33.0)
MCHC: 32.2 g/dL (ref 32.0–36.0)
MCV: 83.5 fL (ref 80.0–100.0)
MPV: 11.1 fL (ref 7.5–12.5)
Platelets: 193 10*3/uL (ref 140–400)
RBC: 4.61 10*6/uL (ref 3.80–5.10)
RDW: 14.5 % (ref 11.0–15.0)
WBC: 4.3 10*3/uL (ref 3.8–10.8)

## 2018-02-14 LAB — VITAMIN D 25 HYDROXY (VIT D DEFICIENCY, FRACTURES): Vit D, 25-Hydroxy: 34 ng/mL (ref 30–100)

## 2018-02-14 LAB — TSH: TSH: 1.95 mIU/L (ref 0.40–4.50)

## 2018-02-26 ENCOUNTER — Telehealth: Payer: Self-pay | Admitting: Family Medicine

## 2018-02-26 ENCOUNTER — Other Ambulatory Visit: Payer: Self-pay | Admitting: Family Medicine

## 2018-02-26 MED ORDER — MONTELUKAST SODIUM 10 MG PO TABS: 10.0000 mg | ORAL_TABLET | Freq: Every day | ORAL | 0 refills | Status: DC

## 2018-02-26 MED ORDER — BENZONATATE 100 MG PO CAPS: 100.0000 mg | ORAL_CAPSULE | Freq: Two times a day (BID) | ORAL | 0 refills | Status: DC | PRN

## 2018-02-26 NOTE — Progress Notes (Signed)
Tessalon perle

## 2018-02-26 NOTE — Telephone Encounter (Signed)
I spoke with the pt, 3 day history, no fever or chills, tessalon perles and singulair are prescribed and she is aware

## 2018-02-26 NOTE — Telephone Encounter (Signed)
complete

## 2018-02-26 NOTE — Telephone Encounter (Signed)
Patient is requesting a prescription for cough and sinus drainage. Cb#: [phone] Pharmacy is [facility]

## 2018-03-06 ENCOUNTER — Other Ambulatory Visit: Payer: Self-pay

## 2018-03-06 ENCOUNTER — Emergency Department (HOSPITAL_COMMUNITY)
Admission: EM | Admit: 2018-03-06 | Discharge: 2018-03-06 | Disposition: A | Discharge status: Home / Self Care | Payer: PPO | Attending: Emergency Medicine | Admitting: Emergency Medicine

## 2018-03-06 ENCOUNTER — Encounter (HOSPITAL_COMMUNITY): Payer: Self-pay | Admitting: Emergency Medicine

## 2018-03-06 DIAGNOSIS — E1122 Type 2 diabetes mellitus with diabetic chronic kidney disease: Secondary | ICD-10-CM | POA: Insufficient documentation

## 2018-03-06 DIAGNOSIS — Z794 Long term (current) use of insulin: Secondary | ICD-10-CM | POA: Diagnosis not present

## 2018-03-06 DIAGNOSIS — N183 Chronic kidney disease, stage 3 (moderate): Secondary | ICD-10-CM | POA: Insufficient documentation

## 2018-03-06 DIAGNOSIS — Z79899 Other long term (current) drug therapy: Secondary | ICD-10-CM | POA: Insufficient documentation

## 2018-03-06 DIAGNOSIS — M25522 Pain in left elbow: Secondary | ICD-10-CM | POA: Diagnosis not present

## 2018-03-06 DIAGNOSIS — Z7982 Long term (current) use of aspirin: Secondary | ICD-10-CM | POA: Diagnosis not present

## 2018-03-06 DIAGNOSIS — M7712 Lateral epicondylitis, left elbow: Secondary | ICD-10-CM

## 2018-03-06 DIAGNOSIS — I130 Hypertensive heart and chronic kidney disease with heart failure and stage 1 through stage 4 chronic kidney disease, or unspecified chronic kidney disease: Secondary | ICD-10-CM | POA: Diagnosis not present

## 2018-03-06 DIAGNOSIS — I5022 Chronic systolic (congestive) heart failure: Secondary | ICD-10-CM | POA: Diagnosis not present

## 2018-03-06 MED ORDER — IBUPROFEN 400 MG PO TABS: 400.0000 mg | ORAL_TABLET | Freq: Three times a day (TID) | ORAL | 0 refills | Status: DC

## 2018-03-06 MED ORDER — KETOROLAC TROMETHAMINE 60 MG/2ML IM SOLN
30.0000 mg | Freq: Once | INTRAMUSCULAR | Status: AC
  Administered 2018-03-06: 30 mg via INTRAMUSCULAR
  Filled 2018-03-06: qty 2

## 2018-03-06 NOTE — ED Notes (Signed)
Given ice pack to take home

## 2018-03-06 NOTE — ED Triage Notes (Signed)
LT arm pain with movement since yesterday evening. Denies any injury.  States she went on vacation and was using her LT arm a lot pulling herself in and out of the [place].

## 2018-03-06 NOTE — ED Provider Notes (Signed)
[facility] EMERGENCY DEPARTMENT Provider Note   CSN: [id number] Arrival date & time: 03/06/18  7001     History   Chief Complaint Chief Complaint  Patient presents with  . Extremity Pain    HPI [name] [name] is a 72 y.o. female.  Left elbow pain worse with motion and with palpation.  No trauma.  No fevers, redness, swelling.   Extremity Pain  This is a new (L elbow) problem. The current episode started 12 to 24 hours ago. The problem occurs constantly. The problem has not changed since onset.Nothing aggravates the symptoms. Nothing relieves the symptoms. She has tried nothing for the symptoms.    Past Medical History:  Diagnosis Date  . Anxiety   . Aortic stenosis   . Arthritis   . Chronic systolic heart failure ([place])   . CKD (chronic kidney disease) stage 3, GFR 30-59 ml/min (HCC)   . Constipation   . Depression   . Diabetes mellitus, type 2 ([place])   . Essential hypertension, benign   . Hyperlipidemia   . Incidental pulmonary nodule, > 36mm and < 47mm    7 x 4 mm LLL nodule  . LBBB (left bundle branch block)   . Nonischemic cardiomyopathy (HCC)    No significant obstructive CAD at heart catheterization 05/2014, LVEF 20%  . Obesity   . Pneumonia 2013  . 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 Symptomatic PVCs     Patient Active Problem List   Diagnosis Date Noted  . Screening for depression 02/10/2018  . Osteoarthritis of left knee 03/16/2017  . Pain in finger of left hand 12/14/2016  . Annual physical exam 08/18/2015  . 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 Severe aortic valve stenosis 03/24/2015  . Hyperlipidemia LDL goal <100 05/24/2014  . Severe aortic stenosis 05/23/2014  . Incidental pulmonary nodule, > 31mm and < 74mm 02/10/2014  . Chronic combined systolic and diastolic CHF (congestive heart failure) ([place])     . LBBB (left bundle branch block) 12/03/2013  . CKD (chronic kidney disease) stage 3, GFR 30-59 ml/min (HCC) 05/21/2013  . Seasonal allergies 02/22/2012  . Essential hypertension, benign   . Nonischemic cardiomyopathy ([place]) 10/14/2011  . Diabetes mellitus, insulin dependent (IDDM), controlled ([place]) 10/24/2007  . Obesity 10/24/2007    Past Surgical History:  Procedure Laterality Date  . CARDIAC CATHETERIZATION    . CESAREAN SECTION  1987  . FLEXIBLE BRONCHOSCOPY N/A 01/14/2015   Procedure: FLEXIBLE BRONCHOSCOPY;  Surgeon: [name], MD;  Location: [place];  Service: Thoracic;  Laterality: N/A;  . LEFT AND RIGHT HEART CATHETERIZATION WITH CORONARY ANGIOGRAM N/A 12/05/2011   Procedure: LEFT AND RIGHT HEART CATHETERIZATION WITH CORONARY ANGIOGRAM;  Surgeon: [name], MD;  Location[facility] CATH LAB;  Service: Cardiovascular;  Laterality: N/A;  . LEFT AND RIGHT HEART CATHETERIZATION WITH CORONARY ANGIOGRAM N/A 05/23/2014   Procedure: LEFT AND RIGHT HEART CATHETERIZATION WITH CORONARY ANGIOGRAM;  Surgeon: [name], MD;  Location: [facility] CATH LAB;  Service: Cardiovascular;  Laterality: N/A;  . MULTIPLE EXTRACTIONS WITH ALVEOLOPLASTY N/A 02/27/2014   Procedure: Extraction of tooth #'s 2,4,5,6,7,8,9,10,11,12, 22, 23, 24, 25, 26, 28 with alveoloplasty and bilateral mandibular tori reductions;  Surgeon: [name], DDS;  Location: [place];  Service: Oral Surgery;  Laterality: N/A;  . RIGID BRONCHOSCOPY N/A 01/14/2015   Procedure: RIGID BRONCHOSCOPY with Removal Of  Foreign Body ;  Surgeon: [name], MD;  Location: [place];  Service: Thoracic;  Laterality: N/A;  . TEE WITHOUT CARDIOVERSION N/A 03/24/2015   Procedure: TRANSESOPHAGEAL ECHOCARDIOGRAM (TEE);  Surgeon: [name], MD;  Location: [place];  Service: Open Heart Surgery;  Laterality: N/A;  . TRANSCATHETER AORTIC VALVE REPLACEMENT, TRANSFEMORAL Bilateral 03/24/2015   Procedure: TRANSCATHETER AORTIC  VALVE REPLACEMENT, TRANSFEMORAL;  Surgeon: [name], MD;  Location: [place];  Service: Open Heart Surgery;  Laterality: Bilateral;  . TUBAL LIGATION  1987  . VIDEO BRONCHOSCOPY Bilateral 06/05/2014   Procedure: VIDEO BRONCHOSCOPY WITHOUT FLUORO;  Surgeon: [name], MD;  Location: [facility] ENDOSCOPY;  Service: Cardiopulmonary;  Laterality: Bilateral;     OB History   None      Home Medications    Prior to Admission medications   Medication Sig Start Date End Date Taking? Authorizing Provider  acetaminophen (TYLENOL) 500 MG tablet Take 1,000 mg by mouth every 6 (six) hours as needed for moderate pain.   Yes [provider]  aspirin EC 81 MG tablet Take 81 mg by mouth daily.   Yes [provider]  carvedilol (COREG) 25 MG tablet TAKE 1 TABLET BY MOUTH TWICE DAILY WITH MEALS. 01/26/18  Yes [name], MD  ezetimibe (ZETIA) 10 MG tablet TAKE ONE TABLET BY MOUTH ONCE DAILY. 01/23/18  Yes Fayrene Helper, MD  fenofibrate (TRICOR) 145 MG tablet TAKE (1) TABLET BY MOUTH ONCE DAILY. 12/25/17  Yes Fayrene Helper, MD  fluticasone (FLONASE) 50 MCG/ACT nasal spray Place 2 sprays into both nostrils daily. 12/06/17  Yes Fayrene Helper, MD  furosemide (LASIX) 40 MG tablet Take 1 tablet (40 mg total) by mouth every morning. 12/25/17  Yes Fayrene Helper, MD  glipiZIDE (GLIPIZIDE XL) 5 MG 24 hr tablet Take 1 tablet (5 mg total) by mouth daily with breakfast. 12/25/17  Yes Fayrene Helper, MD  glucose blood (ONE TOUCH ULTRA TEST) test strip Use as instructed three times daily dx: e11.65 03/15/17  Yes Fayrene Helper, MD  [facility] PEN NEEDLES 31G X 8 MM MISC USE AS NEEDED TO INJECT INSULIN. 08/11/17  Yes Fayrene Helper, MD  Multiple Vitamins-Minerals (MULTIVITAMIN PO) Take 1 tablet by mouth daily.   Yes [provider]  niacin (NIASPAN) 500 MG CR tablet TAKE (1) TABLET BY MOUTH AT BEDTIME. 07/31/17  Yes Fayrene Helper, MD  NOVOLOG  MIX 70/30 FLEXPEN (70-30) 100 UNIT/ML FlexPen INJECT 12 UNITS SUBCUTANEOUSLY TWICE DAILY. 02/09/18  Yes Fayrene Helper, MD  ONE TOUCH ULTRA TEST test strip CHECK BLOOD SUGAR 3 TIMES A DAY. 11/16/17  Yes Fayrene Helper, MD  [facility] LANCETS 53I MISC Three times daily testing dx e11.65 03/15/17  Yes Fayrene Helper, MD  Pitavastatin Calcium (LIVALO) 2 MG TABS TAKE ONE TABLET BY MOUTH ONCE DAILY AFTER SUPPER. 07/31/17  Yes Fayrene Helper, MD  potassium chloride (K-DUR) 10 MEQ tablet TAKE ONE TABLET BY MOUTH DAILY. DO NOT TAKE IF NOT TAKING FUROSEMIDE. 10/20/17  Yes Fayrene Helper, MD  spironolactone (ALDACTONE) 25 MG tablet TAKE 1 TABLET BY MOUTH ONCE DAILY. 10/30/17  Yes Fayrene Helper, MD  albuterol (PROVENTIL HFA;VENTOLIN HFA) 108 (90 Base) MCG/ACT inhaler Inhale 2 puffs into the lungs every 6 (six) hours as needed for wheezing or shortness of breath. 12/06/17   Fayrene Helper, MD  benzonatate (TESSALON) 100 MG capsule Take 1 capsule (100 mg total) by mouth 2 (two)  times daily as needed for cough. 02/26/18   Fayrene Helper, MD  ibuprofen (ADVIL,MOTRIN) 400 MG tablet Take 1 tablet (400 mg total) by mouth 3 (three) times daily. 03/06/18   Jermya Dowding, Corene Cornea, MD  lidocaine-prilocaine (EMLA) cream Apply 1-2 application topically 3 (three) times daily as needed. 08/01/17   [provider]  cetirizine (ZYRTEC) 10 MG tablet Take 1 tablet (10 mg total) by mouth daily. 06/30/11 10/28/11  Fayrene Helper, MD  ferrous sulfate 325 (65 FE) MG EC tablet Take 1 tablet (325 mg total) by mouth 2 (two) times daily. 07/14/11 10/28/11  Fayrene Helper, MD  FLUoxetine (PROZAC) 10 MG tablet Take 1 tablet (10 mg total) by mouth daily. Take one capsule by mouth once a day 12/20/10 11/03/11  Fayrene Helper, MD    Family History Family History  Problem Relation Age of Onset  . Heart disease Mother   . Heart attack Mother 83  . Diabetes Mother   . Hypertension Mother   .  Colon cancer Father 41  . Breast cancer Sister   . Diabetes Brother   . Hypertension Brother   . Stroke Brother   . Cancer Brother   . Congestive Heart Failure Daughter   . Hypertension Son   . High Cholesterol Son   . Congestive Heart Failure Son     Social History Social History   Tobacco Use  . Smoking status: Never Smoker  . Smokeless tobacco: Never Used  Substance Use Topics  . Alcohol use: No  . Drug use: No     Allergies   Crestor [rosuvastatin]; Tramadol; and Penicillins   Review of Systems Review of Systems  All other systems reviewed and are negative.    Physical Exam Updated Vital Signs BP 117/73 (BP Location: Right Arm)   Pulse 83   Temp 99.1 F (37.3 C) (Oral)   Resp 18   Ht 5\' 6"  (1.676 m)   Wt 84.4 kg (186 lb)   SpO2 100%   BMI 30.02 kg/m   Physical Exam  Constitutional: She is oriented to person, place, and time. She appears well-developed and well-nourished.  HENT:  Head: Normocephalic and atraumatic.  Eyes: Conjunctivae and EOM are normal.  Neck: Normal range of motion.  Cardiovascular: Normal rate and regular rhythm.  Pulmonary/Chest: No stridor. No respiratory distress.  Abdominal: Soft. Bowel sounds are normal. She exhibits no distension.  Musculoskeletal: Normal range of motion. She exhibits tenderness (left elbow over left lateral epicondyle ). She exhibits no edema.  Neurological: She is alert and oriented to person, place, and time. No cranial nerve deficit. Coordination normal.  Skin: Skin is warm and dry.  Nursing note and vitals reviewed.    ED Treatments / Results  Labs (all labs ordered are listed, but only abnormal results are displayed) Labs Reviewed - No data to display  EKG None  Radiology No results found.  Procedures Procedures (including critical care time)  Medications Ordered in ED Medications  ketorolac (TORADOL) injection 30 mg (30 mg Intramuscular Given 03/06/18 1057)     Initial Impression /  Assessment and Plan / ED Course  I have reviewed the triage vital signs and the nursing notes.  Pertinent labs & imaging results that were available during my care of the patient were reviewed by me and considered in my medical decision making (see chart for details).     Suspect L lateral epicondylitis. Will treat supportively.   Final Clinical Impressions(s) / ED Diagnoses  Final diagnoses:  Left lateral epicondylitis    ED Discharge Orders        Ordered    ibuprofen (ADVIL,MOTRIN) 400 MG tablet  3 times daily,   Status:  Discontinued     03/06/18 1109    ibuprofen (ADVIL,MOTRIN) 400 MG tablet  3 times daily     03/06/18 1110       Noni Stonesifer, Corene Cornea, MD 03/06/18 1527

## 2018-03-14 ENCOUNTER — Other Ambulatory Visit: Payer: Self-pay | Admitting: Family Medicine

## 2018-03-14 DIAGNOSIS — E785 Hyperlipidemia, unspecified: Secondary | ICD-10-CM

## 2018-03-20 ENCOUNTER — Other Ambulatory Visit: Payer: Self-pay | Admitting: Family Medicine

## 2018-03-28 ENCOUNTER — Other Ambulatory Visit (HOSPITAL_COMMUNITY): Payer: PPO

## 2018-04-02 ENCOUNTER — Other Ambulatory Visit: Payer: Self-pay | Admitting: Family Medicine

## 2018-04-18 NOTE — Progress Notes (Signed)
Cardiology Office Note  Date: 04/19/2018   ID: [name], [name] 1945-09-22, MRN [id number]  PCP: Fayrene Helper, MD  Primary Cardiologist: Rozann Lesches, MD   Chief Complaint  Patient presents with  . Valvular heart disease    History of Present Illness: [name] [name] is a 72 y.o. female last seen in February.  She is here for a routine follow-up visit.  Reports no chest pain or change in stamina.  Main limitation is related to arthritis affecting her left knee, also left arm and shoulder.  She remains functional with ADLs.  She has had no palpitations or syncope.  I reviewed her medications which are outlined below.  She continues to follow regularly with Dr. Moshe Cipro.  I personally reviewed her ECG today which shows a sinus rhythm with left bundle branch block and PVC.  We discussed obtaining a follow-up echocardiogram in comparison to the study from last year.  Past Medical History:  Diagnosis Date  . Anxiety   . Aortic stenosis   . Arthritis   . Chronic systolic heart failure ([place])   . CKD (chronic kidney disease) stage 3, GFR 30-59 ml/min (HCC)   . Constipation   . Depression   . Diabetes mellitus, type 2 ([place])   . Essential hypertension, benign   . Hyperlipidemia   . Incidental pulmonary nodule, > 37mm and < 52mm    7 x 4 mm LLL nodule  . LBBB (left bundle branch block)   . Nonischemic cardiomyopathy (HCC)    No significant obstructive CAD at heart catheterization 05/2014, LVEF 20%  . Obesity   . Pneumonia 2013  . 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 Symptomatic PVCs     Past Surgical History:  Procedure Laterality Date  . CARDIAC CATHETERIZATION    . CESAREAN SECTION  1987  . FLEXIBLE BRONCHOSCOPY N/A 01/14/2015   Procedure: FLEXIBLE BRONCHOSCOPY;  Surgeon: [name], MD;  Location: [facility];  Service: Thoracic;   Laterality: N/A;  . LEFT AND RIGHT HEART CATHETERIZATION WITH CORONARY ANGIOGRAM N/A 12/05/2011   Procedure: LEFT AND RIGHT HEART CATHETERIZATION WITH CORONARY ANGIOGRAM;  Surgeon: [name], MD;  Location: [facility] CATH LAB;  Service: Cardiovascular;  Laterality: N/A;  . LEFT AND RIGHT HEART CATHETERIZATION WITH CORONARY ANGIOGRAM N/A 05/23/2014   Procedure: LEFT AND RIGHT HEART CATHETERIZATION WITH CORONARY ANGIOGRAM;  Surgeon: [name], MD;  [facility] C[facility] LAB;  Service: Cardiovascular;  Laterality: N/A;  . MULTIPLE EXTRACTIONS WITH ALVEOLOPLASTY N/A 02/27/2014   Procedure: Extraction of tooth #'s 2,4,5,6,7,8,9,10,11,12, 22, 23, 24, 25, 26, 28 with alveoloplasty and bilateral mandibular tori reductions;  Surgeon: [name], DDS;  Location: [place];  Service: Oral Surgery;  Laterality: N/A;  . RIGID BRONCHOSCOPY N/A 01/14/2015   Procedure: RIGID BRONCHOSCOPY with Removal Of Foreign Body ;  Surgeon: [name], MD;  Location: [place];  Service: Thoracic;  Laterality: N/A;  . TEE WITHOUT CARDIOVERSION N/A 03/24/2015   Procedure: TRANSESOPHAGEAL ECHOCARDIOGRAM (TEE);  Surgeon: [name], MD;  Location: [place];  Service: Open Heart Surgery;  Laterality: N/A;  . TRANSCATHETER AORTIC VALVE REPLACEMENT, TRANSFEMORAL Bilateral 03/24/2015   Procedure: TRANSCATHETER AORTIC VALVE REPLACEMENT, TRANSFEMORAL;  Surgeon: [name], MD;  Location: [place];  Service: Open Heart Surgery;  Laterality: Bilateral;  . TUBAL LIGATION  1987  . VIDEO BRONCHOSCOPY Bilateral 06/05/2014  Procedure: VIDEO BRONCHOSCOPY WITHOUT FLUORO;  Surgeon: [name], MD;  Location[facility] ENDOSCOPY;  Service: Cardiopulmonary;  Laterality: Bilateral;    Current Outpatient Medications  Medication Sig Dispense Refill  . acetaminophen (TYLENOL) 500 MG tablet Take 1,000 mg by mouth every 6 (six) hours as needed for moderate pain.    Marland Kitchen aspirin EC 81 MG tablet Take 81 mg by mouth daily.     . carvedilol (COREG) 25 MG tablet TAKE 1 TABLET BY MOUTH TWICE DAILY WITH MEALS. 60 tablet 6  . ezetimibe (ZETIA) 10 MG tablet TAKE ONE TABLET BY MOUTH ONCE DAILY. 90 tablet 0  . fenofibrate (TRICOR) 145 MG tablet TAKE (1) TABLET BY MOUTH ONCE DAILY. 30 tablet 5  . fluticasone (FLONASE) 50 MCG/ACT nasal spray Place 2 sprays into both nostrils daily. 16 g 6  . furosemide (LASIX) 40 MG tablet Take 1 tablet (40 mg total) by mouth every morning. 30 tablet 5  . glipiZIDE (GLIPIZIDE XL) 5 MG 24 hr tablet Take 1 tablet (5 mg total) by mouth daily with breakfast. 90 tablet 1  . glucose blood (ONE TOUCH ULTRA TEST) test strip Use as instructed three times daily dx: e11.65 100 each 5  . ibuprofen (ADVIL,MOTRIN) 400 MG tablet Take 1 tablet (400 mg total) by mouth 3 (three) times daily. 30 tablet 0  . lidocaine-prilocaine (EMLA) cream Apply 1-2 application topically 3 (three) times daily as needed.    Marland Kitchen LITETOUCH PEN NEEDLES 31G X 8 MM MISC USE AS NEEDED TO INJECT INSULIN. 100 each 5  . Multiple Vitamins-Minerals (MULTIVITAMIN PO) Take 1 tablet by mouth daily.    . niacin (NIASPAN) 500 MG CR tablet TAKE (1) TABLET BY MOUTH AT BEDTIME. 90 tablet 0  . NOVOLOG MIX 70/30 FLEXPEN (70-30) 100 UNIT/ML FlexPen INJECT 12 UNITS SUBCUTANEOUSLY TWICE DAILY. 15 mL 0  . ONE TOUCH ULTRA TEST test strip CHECK BLOOD SUGAR 3 TIMES A DAY. 100 each 0  . ONETOUCH DELICA LANCETS 18E MISC USE 3 TIMES A DAY AS DIRECTED. 100 each 0  . Pitavastatin Calcium (LIVALO) 2 MG TABS TAKE ONE TABLET BY MOUTH ONCE DAILY AFTER SUPPER. 90 tablet 1  . potassium chloride (K-DUR) 10 MEQ tablet TAKE ONE TABLET BY MOUTH DAILY. DO NOT TAKE IF NOT TAKING FUROSEMIDE. 90 tablet 0  . spironolactone (ALDACTONE) 25 MG tablet TAKE 1 TABLET BY MOUTH ONCE DAILY. 90 tablet 0   No current facility-administered medications for this visit.    Allergies:  Crestor [rosuvastatin]; Tramadol; and Penicillins   Social History: The patient  reports that she has  never smoked. She has never used smokeless tobacco. She reports that she does not drink alcohol or use drugs.   ROS:  Please see the history of present illness. Otherwise, complete review of systems is positive for arthritic pains and stiffness.  All other systems are reviewed and negative.   Physical Exam: VS:  BP 106/68 (BP Location: Right Arm)   Pulse 81   Ht 5\' 6"  (1.676 m)   Wt 187 lb (84.8 kg)   SpO2 98%   BMI 30.18 kg/m , BMI Body mass index is 30.18 kg/m.  Wt Readings from Last 3 Encounters:  04/19/18 187 lb (84.8 kg)  03/06/18 186 lb (84.4 kg)  02/08/18 183 lb 1.3 oz (83 kg)    General: Elderly woman, appears comfortable at rest. HEENT: Conjunctiva and lids normal, oropharynx clear. Neck: Supple, no elevated JVP or carotid bruits, no thyromegaly. Lungs: Clear to auscultation, nonlabored breathing  at rest. Cardiac: Regular rate and rhythm, no S3, 3/6 systolic murmur. Abdomen: Soft, nontender, bowel sounds present. Extremities: Mild ankle edema, distal pulses 2+. Skin: Warm and dry. Musculoskeletal: No kyphosis. Neuropsychiatric: Alert and oriented x3, affect grossly appropriate.  ECG: I personally reviewed the tracing from 04/06/2017 which showed sinus rhythm with left bundle branch block.  Recent Labwork: 02/13/2018: ALT 16; AST 21; BUN 33; Creat 1.81; Hemoglobin 12.4; Platelets 193; Potassium 4.4; Sodium 143; TSH 1.95     Component Value Date/Time   CHOL 164 02/13/2018 0944   TRIG 84 02/13/2018 0944   HDL 59 02/13/2018 0944   CHOLHDL 2.8 02/13/2018 0944   VLDL 21 02/06/2017 0916   LDLCALC 88 02/13/2018 0944    Other Studies Reviewed Today:  Echocardiogram 04/11/2017: Study Conclusions  - Left ventricle: The cavity size was mildly dilated. Systolic   function was severely reduced. The estimated ejection fraction   was in the range of 20% to 25%. Diffuse hypokinesis. The study is   not technically sufficient to allow evaluation of LV diastolic   function. -  Ventricular septum: Septal motion showed abnormal function and   dyssynergy. - Aortic valve: 23 mm Edwards Sapien 3 in aortic position. No   obvious perivalvular leak. There was no significant   regurgitation. Mean gradient (S): 19 mm Hg. - Mitral valve: Mildly calcified annulus. There was mild   regurgitation. - Atrial septum: No defect or patent foramen ovale was identified. - Tricuspid valve: There was trivial regurgitation. - Pulmonary arteries: PA peak pressure: 36 mm Hg (S). - Pericardium, extracardiac: There was no pericardial effusion.  Impressions:  - Mildly dilated left ventricle with mild LVH and LVEF 20-25%.   There is diffuse hypokinesis with septal dyssynergy.   Indeterminate diastolic function. Mildly calcified mitral annulus   with mild mitral regurgitation. 23 mm Edwards Sapien 3 in aortic   position without perivalvular leak or aortic regurgitation. Mean   gradient suggests adequate function overall. Trivial tricuspid   regurgitation with PASP 36 mmHg.  Assessment and Plan:  1.  Aortic stenosis status post TAVR in August 2016.  Follow-up echocardiogram to be obtained.  No significant change in cardiac murmur.  2.  Nonischemic cardiomyopathy with LVEF 20 to 25% as of August 2018.  She has declined ICD.  We will continue medical therapy.  3.  CKD stage III, creatinine 1.8.  4.  Mixed hyperlipidemia, continues on Zetia and Livalo Dr. Moshe Cipro.  Last LDL 88.  Current medicines were reviewed with the patient today.   Orders Placed This Encounter  Procedures  . EKG 12-Lead  . ECHOCARDIOGRAM COMPLETE    Disposition: Follow-up in 6 months.  Signed, [name], MD, [facility] 04/19/2018 11:25 AM    [facility] [place]. [address] [place], [place] [address] Phone: [phone]; Fax: [phone]

## 2018-04-19 ENCOUNTER — Encounter: Payer: Self-pay | Admitting: Cardiology

## 2018-04-19 ENCOUNTER — Ambulatory Visit: Payer: PPO | Admitting: Cardiology

## 2018-04-19 ENCOUNTER — Other Ambulatory Visit: Payer: Self-pay | Admitting: Family Medicine

## 2018-04-19 VITALS — BP 106/68 | HR 81 | Ht 66.0 in | Wt 187.0 lb

## 2018-04-19 DIAGNOSIS — Z952 Presence of prosthetic heart valve: Secondary | ICD-10-CM

## 2018-04-19 DIAGNOSIS — N183 Chronic kidney disease, stage 3 unspecified: Secondary | ICD-10-CM

## 2018-04-19 DIAGNOSIS — I428 Other cardiomyopathies: Secondary | ICD-10-CM | POA: Diagnosis not present

## 2018-04-19 DIAGNOSIS — E782 Mixed hyperlipidemia: Secondary | ICD-10-CM | POA: Diagnosis not present

## 2018-04-19 NOTE — Patient Instructions (Signed)
Your physician wants you to follow-up in: 6 months with Dr.McDowell You will receive a reminder letter in the mail two months in advance. If you don't receive a letter, please call our office to schedule the follow-up appointment.    Your physician has requested that you have an echocardiogram. Echocardiography is a painless test that uses sound waves to create images of your heart. It provides your doctor with information about the size and shape of your heart and how well your heart's chambers and valves are working. This procedure takes approximately one hour. There are no restrictions for this procedure.    Your physician recommends that you continue on your current medications as directed. Please refer to the Current Medication list given to you today.   If you need a refill on your cardiac medications before your next appointment, please call your pharmacy.   No labs or tests today.      Thank you for choosing [facility] !

## 2018-04-25 ENCOUNTER — Ambulatory Visit (HOSPITAL_COMMUNITY)
Admission: RE | Admit: 2018-04-25 | Discharge: 2018-04-25 | Disposition: A | Discharge status: Home / Self Care | Payer: PPO | Source: Ambulatory Visit | Attending: Cardiology | Admitting: Cardiology

## 2018-04-25 DIAGNOSIS — I428 Other cardiomyopathies: Secondary | ICD-10-CM | POA: Diagnosis not present

## 2018-04-25 DIAGNOSIS — Z953 Presence of xenogenic heart valve: Secondary | ICD-10-CM | POA: Diagnosis not present

## 2018-04-25 DIAGNOSIS — I5042 Chronic combined systolic (congestive) and diastolic (congestive) heart failure: Secondary | ICD-10-CM | POA: Diagnosis not present

## 2018-04-25 DIAGNOSIS — E1122 Type 2 diabetes mellitus with diabetic chronic kidney disease: Secondary | ICD-10-CM | POA: Diagnosis not present

## 2018-04-25 DIAGNOSIS — I130 Hypertensive heart and chronic kidney disease with heart failure and stage 1 through stage 4 chronic kidney disease, or unspecified chronic kidney disease: Secondary | ICD-10-CM | POA: Diagnosis not present

## 2018-04-25 DIAGNOSIS — E785 Hyperlipidemia, unspecified: Secondary | ICD-10-CM | POA: Diagnosis not present

## 2018-04-25 DIAGNOSIS — Z952 Presence of prosthetic heart valve: Secondary | ICD-10-CM

## 2018-04-25 DIAGNOSIS — I340 Nonrheumatic mitral (valve) insufficiency: Secondary | ICD-10-CM | POA: Insufficient documentation

## 2018-04-25 DIAGNOSIS — I429 Cardiomyopathy, unspecified: Secondary | ICD-10-CM | POA: Insufficient documentation

## 2018-04-25 DIAGNOSIS — N183 Chronic kidney disease, stage 3 (moderate): Secondary | ICD-10-CM | POA: Diagnosis not present

## 2018-04-25 NOTE — Progress Notes (Signed)
*  PRELIMINARY RESULTS* Echocardiogram 2D Echocardiogram has been performed.  [name] [name] 04/25/2018, 10:16 AM

## 2018-04-30 ENCOUNTER — Other Ambulatory Visit: Payer: Self-pay | Admitting: Family Medicine

## 2018-05-18 ENCOUNTER — Other Ambulatory Visit: Payer: Self-pay | Admitting: Family Medicine

## 2018-06-01 ENCOUNTER — Other Ambulatory Visit: Payer: Self-pay | Admitting: Family Medicine

## 2018-06-08 ENCOUNTER — Other Ambulatory Visit: Payer: Self-pay | Admitting: Family Medicine

## 2018-06-14 ENCOUNTER — Other Ambulatory Visit: Payer: Self-pay | Admitting: Family Medicine

## 2018-06-14 DIAGNOSIS — E785 Hyperlipidemia, unspecified: Secondary | ICD-10-CM

## 2018-06-27 ENCOUNTER — Ambulatory Visit (INDEPENDENT_AMBULATORY_CARE_PROVIDER_SITE_OTHER): Payer: PPO | Admitting: Family Medicine

## 2018-06-27 ENCOUNTER — Encounter: Payer: Self-pay | Admitting: Family Medicine

## 2018-06-27 VITALS — BP 120/70 | HR 69 | Resp 16 | Ht 66.0 in | Wt 185.0 lb

## 2018-06-27 DIAGNOSIS — E1021 Type 1 diabetes mellitus with diabetic nephropathy: Secondary | ICD-10-CM

## 2018-06-27 DIAGNOSIS — E1122 Type 2 diabetes mellitus with diabetic chronic kidney disease: Secondary | ICD-10-CM | POA: Diagnosis not present

## 2018-06-27 DIAGNOSIS — Z794 Long term (current) use of insulin: Secondary | ICD-10-CM | POA: Diagnosis not present

## 2018-06-27 DIAGNOSIS — N184 Chronic kidney disease, stage 4 (severe): Secondary | ICD-10-CM

## 2018-06-27 DIAGNOSIS — E785 Hyperlipidemia, unspecified: Secondary | ICD-10-CM

## 2018-06-27 DIAGNOSIS — E6609 Other obesity due to excess calories: Secondary | ICD-10-CM | POA: Diagnosis not present

## 2018-06-27 DIAGNOSIS — I5042 Chronic combined systolic (congestive) and diastolic (congestive) heart failure: Secondary | ICD-10-CM | POA: Diagnosis not present

## 2018-06-27 DIAGNOSIS — E119 Type 2 diabetes mellitus without complications: Secondary | ICD-10-CM | POA: Diagnosis not present

## 2018-06-27 DIAGNOSIS — IMO0001 Reserved for inherently not codable concepts without codable children: Secondary | ICD-10-CM

## 2018-06-27 DIAGNOSIS — E1121 Type 2 diabetes mellitus with diabetic nephropathy: Secondary | ICD-10-CM | POA: Diagnosis not present

## 2018-06-27 DIAGNOSIS — I129 Hypertensive chronic kidney disease with stage 1 through stage 4 chronic kidney disease, or unspecified chronic kidney disease: Secondary | ICD-10-CM | POA: Diagnosis not present

## 2018-06-27 DIAGNOSIS — Z23 Encounter for immunization: Secondary | ICD-10-CM

## 2018-06-27 DIAGNOSIS — Z1211 Encounter for screening for malignant neoplasm of colon: Secondary | ICD-10-CM

## 2018-06-27 DIAGNOSIS — Z6830 Body mass index (BMI) 30.0-30.9, adult: Secondary | ICD-10-CM | POA: Diagnosis not present

## 2018-06-27 DIAGNOSIS — I10 Essential (primary) hypertension: Secondary | ICD-10-CM

## 2018-06-27 MED ORDER — EZETIMIBE 10 MG PO TABS: 10.0000 mg | ORAL_TABLET | Freq: Every day | ORAL | 1 refills | Status: DC

## 2018-06-27 MED ORDER — NIACIN ER (ANTIHYPERLIPIDEMIC) 500 MG PO TBCR: EXTENDED_RELEASE_TABLET | ORAL | 1 refills | Status: DC

## 2018-06-27 MED ORDER — SPIRONOLACTONE 25 MG PO TABS: 25.0000 mg | ORAL_TABLET | Freq: Every day | ORAL | 1 refills | Status: DC

## 2018-06-27 NOTE — Patient Instructions (Addendum)
F/U in 4.5 months, cqall if you need mebefore  Blood pressure is good  HBA1C , chem 7 and EGFR today  Cologuard  To be arranged today before she leaves  Flu vaccine today  Use tylenol for left knee pain and topical rub

## 2018-06-28 LAB — BASIC METABOLIC PANEL WITH GFR
BUN/Creatinine Ratio: 13 (calc) (ref 6–22)
BUN: 30 mg/dL — ABNORMAL HIGH (ref 7–25)
CO2: 27 mmol/L (ref 20–32)
Calcium: 10 mg/dL (ref 8.6–10.4)
Chloride: 106 mmol/L (ref 98–110)
Creat: 2.28 mg/dL — ABNORMAL HIGH (ref 0.60–0.93)
GFR, Est African American: 24 mL/min/{1.73_m2} — ABNORMAL LOW (ref >=60)
GFR, Est Non African American: 21 mL/min/{1.73_m2} — ABNORMAL LOW (ref >=60)
Glucose, Bld: 188 mg/dL — ABNORMAL HIGH (ref 65–139)
Potassium: 4.4 mmol/L (ref 3.5–5.3)
Sodium: 142 mmol/L (ref 135–146)

## 2018-06-28 LAB — HEMOGLOBIN A1C
Hgb A1c MFr Bld: 7.9 % of total Hgb — ABNORMAL HIGH (ref <5.7)
Mean Plasma Glucose: 180 (calc)
eAG (mmol/L): 10 (calc)

## 2018-06-29 NOTE — Progress Notes (Signed)
[name] [name]     MRN: [id number]      DOB: 12-04-1945   HPI [name] [name] is here for follow up and re-evaluation of chronic medical conditions, medication management and review of any available recent lab and radiology data.  Preventive health is updated, specifically  Cancer screening and Immunization.   Questions or concerns regarding consultations or procedures which the PT has had in the interim are  addressed. The PT denies any adverse reactions to current medications since the last visit.  There are no new concerns.  There are no specific complaints   ROS Denies recent fever or chills. Denies sinus pressure, nasal congestion, ear pain or sore throat. Denies chest congestion, productive cough or wheezing. Denies chest pains, palpitations and leg swelling Denies abdominal pain, nausea, vomiting,diarrhea or constipation.   Denies dysuria, frequency, hesitancy or incontinence. Denies joint pain, swelling and limitation in mobility. Denies headaches, seizures, numbness, or tingling. Denies depression, anxiety or insomnia. Denies skin break down or rash.   PE  BP 120/70   Pulse 69   Resp 16   Ht 5\' 6"  (1.676 m)   Wt 185 lb (83.9 kg)   SpO2 99%   BMI 29.86 kg/m   Patient alert and oriented and in no cardiopulmonary distress.  HEENT: No facial asymmetry, EOMI,   oropharynx pink and moist.  Neck supple no JVD, no mass.  Chest: Clear to auscultation bilaterally.  CVS: S1, S2 no murmurs, no S3.Regular rate.  ABD: Soft non tender.   Ext: No edema  MS: Adequate ROM spine, shoulders, hips and knees.  Skin: Intact, no ulcerations or rash noted.  Psych: Good eye contact, normal affect. Memory intact not anxious or depressed appearing.  CNS: CN 2-12 intact, power,  normal throughout.no focal deficits noted.   Assessment & Plan  Chronic combined systolic and diastolic CHF (congestive heart failure) (HCC) Stable and doing well, asymptomatic, and clinic exam is  normal  Type 2 diabetes mellitus with nephropathy ([place]) [name] [name] is reminded of the importance of commitment to daily physical activity for 30 minutes or more, as able and the need to limit carbohydrate intake to 30 to 60 grams per meal to help with blood sugar control.   The need to take medication as prescribed, test blood sugar as directed, and to call between visits if there is a concern that blood sugar is uncontrolled is also discussed.   [name] [name] is reminded of the importance of daily foot exam, annual eye examination, and good blood sugar, blood pressure and cholesterol control. Deteriorated  Diabetic Labs Latest Ref Rng & Units 06/27/2018 02/13/2018 02/09/2018 07/31/2017 02/06/2017  HbA1c <5.7 % of total Hgb 7.9(H) 7.4(H) - 7.0(H) 7.5(H)  Microalbumin Not Estab. ug/mL - - <3.0(H) - -  Micro/Creat Ratio 0.0 - 30.0 mg/g creat - - <5.9 - -  Chol <200 mg/dL - 164 - - 157  HDL >50 mg/dL - 59 - - 56  Calc LDL mg/dL (calc) - 88 - - 80  Triglycerides <150 mg/dL - 84 - - 103  Creatinine 0.60 - 0.93 mg/dL 2.28(H) 1.81(H) - 1.71(H) 1.88(H)   BP/Weight 06/27/2018 04/19/2018 03/06/2018 02/08/2018 02/01/2018 10/11/2017 74/07/8785  Systolic BP 767 209 470 962 836 629 476  Diastolic BP 70 68 73 78 70 70 70  Wt. (Lbs) 185 187 186 183.08 186 186.8 184  BMI 29.86 30.18 30.02 29.55 30.02 30.15 29.7   Foot/eye exam completion dates Latest Ref Rng & Units 07/31/2017 05/31/2016  Eye Exam No Retinopathy - No Retinopathy  Foot exam Order - - -  Foot Form Completion - Done -        Hyperlipidemia LDL goal <100 Hyperlipidemia:Low fat diet discussed and encouraged.   Lipid Panel  Lab Results  Component Value Date   CHOL 164 02/13/2018   HDL 59 02/13/2018   LDLCALC 88 02/13/2018   TRIG 84 02/13/2018   CHOLHDL 2.8 02/13/2018   Controlled, no change in medication     Obesity Unchanged Patient re-educated about  the importance of commitment to a  minimum of 150 minutes of exercise per  week.  The importance of healthy food choices with portion control discussed. Encouraged to start a food diary, count calories and to consider  joining a support group. Sample diet sheets offered. Goals set by the patient for the next several months.   Weight /BMI 06/27/2018 04/19/2018 03/06/2018  WEIGHT 185 lb 187 lb 186 lb  HEIGHT 5\' 6"  5\' 6"  5\' 6"   BMI 29.86 kg/m2 30.18 kg/m2 30.02 kg/m2      Essential hypertension, benign Controlled, no change in medication DASH diet and commitment to daily physical activity for a minimum of 30 minutes discussed and encouraged, as a part of hypertension management. The importance of attaining a healthy weight is also discussed.  BP/Weight 06/27/2018 04/19/2018 03/06/2018 02/08/2018 02/01/2018 10/11/2017 85/27/7824  Systolic BP 235 361 443 154 008 676 195  Diastolic BP 70 68 73 78 70 70 70  Wt. 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

## 2018-06-29 NOTE — Assessment & Plan Note (Signed)
Stable and doing well, asymptomatic, and clinic exam is normal

## 2018-06-29 NOTE — Assessment & Plan Note (Signed)
Unchanged Patient re-educated about  the importance of commitment to a  minimum of 150 minutes of exercise per week.  The importance of healthy food choices with portion control discussed. Encouraged to start a food diary, count calories and to consider  joining a support group. Sample diet sheets offered. Goals set by the patient for the next several months.   Weight /BMI 06/27/2018 04/19/2018 03/06/2018  WEIGHT 185 lb 187 lb 186 lb  HEIGHT 5\' 6"  5\' 6"  5\' 6"   BMI 29.86 kg/m2 30.18 kg/m2 30.02 kg/m2

## 2018-06-29 NOTE — Assessment & Plan Note (Signed)
Hyperlipidemia:Low fat diet discussed and encouraged.   Lipid Panel  Lab Results  Component Value Date   CHOL 164 02/13/2018   HDL 59 02/13/2018   LDLCALC 88 02/13/2018   TRIG 84 02/13/2018   CHOLHDL 2.8 02/13/2018   Controlled, no change in medication

## 2018-06-29 NOTE — Assessment & Plan Note (Signed)
Controlled, no change in medication DASH diet and commitment to daily physical activity for a minimum of 30 minutes discussed and encouraged, as a part of hypertension management. The importance of attaining a healthy weight is also discussed.  BP/Weight 06/27/2018 04/19/2018 03/06/2018 02/08/2018 02/01/2018 10/11/2017 24/49/7530  Systolic BP 051 102 111 735 670 141 030  Diastolic BP 70 68 73 78 70 70 70  Wt. (Lbs) 185 187 186 183.08 186 186.8 184  BMI 29.86 30.18 30.02 29.55 30.02 30.15 29.7

## 2018-06-29 NOTE — Assessment & Plan Note (Signed)
Deteriorated , pt advised of need to improve blood sugar control and to avoid nephrotoxic drugs

## 2018-06-29 NOTE — Assessment & Plan Note (Signed)
[name] [name] is reminded of the importance of commitment to daily physical activity for 30 minutes or more, as able and the need to limit carbohydrate intake to 30 to 60 grams per meal to help with blood sugar control.   The need to take medication as prescribed, test blood sugar as directed, and to call between visits if there is a concern that blood sugar is uncontrolled is also discussed.   [name] [name] is reminded of the importance of daily foot exam, annual eye examination, and good blood sugar, blood pressure and cholesterol control. Deteriorated  Diabetic Labs Latest Ref Rng & Units 06/27/2018 02/13/2018 02/09/2018 07/31/2017 02/06/2017  HbA1c <5.7 % of total Hgb 7.9(H) 7.4(H) - 7.0(H) 7.5(H)  Microalbumin Not Estab. ug/mL - - <3.0(H) - -  Micro/Creat Ratio 0.0 - 30.0 mg/g creat - - <5.9 - -  Chol <200 mg/dL - 164 - - 157  HDL >50 mg/dL - 59 - - 56  Calc LDL mg/dL (calc) - 88 - - 80  Triglycerides <150 mg/dL - 84 - - 103  Creatinine 0.60 - 0.93 mg/dL 2.28(H) 1.81(H) - 1.71(H) 1.88(H)   BP/Weight 06/27/2018 04/19/2018 03/06/2018 02/08/2018 02/01/2018 10/11/2017 59/16/3846  Systolic BP 659 935 701 779 390 300 923  Diastolic BP 70 68 73 78 70 70 70  Wt. (Lbs) 185 187 186 183.08 186 186.8 184  BMI 29.86 30.18 30.02 29.55 30.02 30.15 29.7   Foot/eye exam completion dates Latest Ref Rng & Units 07/31/2017 05/31/2016  Eye Exam No Retinopathy - No Retinopathy  Foot exam Order - - -  Foot Form Completion - Done -

## 2018-07-08 ENCOUNTER — Emergency Department (HOSPITAL_COMMUNITY): Payer: PPO

## 2018-07-08 ENCOUNTER — Other Ambulatory Visit: Payer: Self-pay

## 2018-07-08 ENCOUNTER — Emergency Department (HOSPITAL_COMMUNITY)
Admission: EM | Admit: 2018-07-08 | Discharge: 2018-07-08 | Disposition: A | Discharge status: Home / Self Care | Payer: PPO | Attending: Emergency Medicine | Admitting: Emergency Medicine

## 2018-07-08 ENCOUNTER — Encounter (HOSPITAL_COMMUNITY): Payer: Self-pay | Admitting: Emergency Medicine

## 2018-07-08 DIAGNOSIS — N184 Chronic kidney disease, stage 4 (severe): Secondary | ICD-10-CM | POA: Diagnosis not present

## 2018-07-08 DIAGNOSIS — R52 Pain, unspecified: Secondary | ICD-10-CM

## 2018-07-08 DIAGNOSIS — J40 Bronchitis, not specified as acute or chronic: Secondary | ICD-10-CM | POA: Diagnosis not present

## 2018-07-08 DIAGNOSIS — Z794 Long term (current) use of insulin: Secondary | ICD-10-CM | POA: Insufficient documentation

## 2018-07-08 DIAGNOSIS — I130 Hypertensive heart and chronic kidney disease with heart failure and stage 1 through stage 4 chronic kidney disease, or unspecified chronic kidney disease: Secondary | ICD-10-CM | POA: Insufficient documentation

## 2018-07-08 DIAGNOSIS — J9809 Other diseases of bronchus, not elsewhere classified: Secondary | ICD-10-CM | POA: Diagnosis not present

## 2018-07-08 DIAGNOSIS — E1122 Type 2 diabetes mellitus with diabetic chronic kidney disease: Secondary | ICD-10-CM | POA: Insufficient documentation

## 2018-07-08 DIAGNOSIS — R05 Cough: Secondary | ICD-10-CM | POA: Diagnosis present

## 2018-07-08 DIAGNOSIS — Z79899 Other long term (current) drug therapy: Secondary | ICD-10-CM | POA: Insufficient documentation

## 2018-07-08 DIAGNOSIS — Z7982 Long term (current) use of aspirin: Secondary | ICD-10-CM | POA: Diagnosis not present

## 2018-07-08 DIAGNOSIS — R531 Weakness: Secondary | ICD-10-CM | POA: Diagnosis not present

## 2018-07-08 DIAGNOSIS — M25461 Effusion, right knee: Secondary | ICD-10-CM | POA: Diagnosis not present

## 2018-07-08 DIAGNOSIS — I5042 Chronic combined systolic (congestive) and diastolic (congestive) heart failure: Secondary | ICD-10-CM | POA: Insufficient documentation

## 2018-07-08 LAB — URINALYSIS, ROUTINE W REFLEX MICROSCOPIC
Bilirubin Urine: NEGATIVE
Glucose, UA: NEGATIVE mg/dL
Hgb urine dipstick: NEGATIVE
Ketones, ur: NEGATIVE mg/dL
Leukocytes, UA: NEGATIVE
Nitrite: NEGATIVE
Protein, ur: NEGATIVE mg/dL
Specific Gravity, Urine: 1.006 (ref 1.005–1.030)
pH: 5 (ref 5.0–8.0)

## 2018-07-08 LAB — COMPREHENSIVE METABOLIC PANEL
ALT: 23 U/L (ref 0–44)
AST: 25 U/L (ref 15–41)
Albumin: 3.7 g/dL (ref 3.5–5.0)
Alkaline Phosphatase: 34 U/L — ABNORMAL LOW (ref 38–126)
Anion gap: 8 (ref 5–15)
BUN: 43 mg/dL — ABNORMAL HIGH (ref 8–23)
CO2: 23 mmol/L (ref 22–32)
Calcium: 9.2 mg/dL (ref 8.9–10.3)
Chloride: 104 mmol/L (ref 98–111)
Creatinine, Ser: 1.71 mg/dL — ABNORMAL HIGH (ref 0.44–1.00)
GFR calc Af Amer: 33 mL/min — ABNORMAL LOW (ref >60)
GFR calc non Af Amer: 28 mL/min — ABNORMAL LOW (ref >60)
Glucose, Bld: 208 mg/dL — ABNORMAL HIGH (ref 70–99)
Potassium: 3.9 mmol/L (ref 3.5–5.1)
Sodium: 135 mmol/L (ref 135–145)
Total Bilirubin: 0.6 mg/dL (ref 0.3–1.2)
Total Protein: 7.9 g/dL (ref 6.5–8.1)

## 2018-07-08 LAB — CBC WITH DIFFERENTIAL/PLATELET
Abs Immature Granulocytes: 0.01 10*3/uL (ref 0.00–0.07)
Basophils Absolute: 0.1 10*3/uL (ref 0.0–0.1)
Basophils Relative: 2 %
Eosinophils Absolute: 0.2 10*3/uL (ref 0.0–0.5)
Eosinophils Relative: 3 %
HCT: 36.4 % (ref 36.0–46.0)
Hemoglobin: 11.3 g/dL — ABNORMAL LOW (ref 12.0–15.0)
Immature Granulocytes: 0 %
Lymphocytes Relative: 26 %
Lymphs Abs: 1.4 10*3/uL (ref 0.7–4.0)
MCH: 26.3 pg (ref 26.0–34.0)
MCHC: 31 g/dL (ref 30.0–36.0)
MCV: 84.7 fL (ref 80.0–100.0)
Monocytes Absolute: 0.6 10*3/uL (ref 0.1–1.0)
Monocytes Relative: 11 %
Neutro Abs: 3.2 10*3/uL (ref 1.7–7.7)
Neutrophils Relative %: 58 %
Platelets: 229 10*3/uL (ref 150–400)
RBC: 4.3 MIL/uL (ref 3.87–5.11)
RDW: 14.6 % (ref 11.5–15.5)
WBC: 5.5 10*3/uL (ref 4.0–10.5)
nRBC: 0 % (ref 0.0–0.2)

## 2018-07-08 MED ORDER — DOXYCYCLINE HYCLATE 100 MG PO CAPS: 100.0000 mg | ORAL_CAPSULE | Freq: Two times a day (BID) | ORAL | 0 refills | Status: DC

## 2018-07-08 NOTE — Discharge Instructions (Addendum)
Follow-up with your doctor in 1 to 2 weeks if not improving

## 2018-07-08 NOTE — ED Provider Notes (Signed)
[facility] EMERGENCY DEPARTMENT Provider Note   CSN: [id number] Arrival date & time: 07/08/18  1532     History   Chief Complaint Chief Complaint  Patient presents with  . Fatigue    HPI [name] [name] is a 72 y.o. female.  Patient complains of a cough and weakness for a number days.  The history is provided by the patient. No language interpreter was used.  Weakness  Primary symptoms include no focal weakness. This is a new problem. The current episode started more than 2 days ago. The problem has not changed since onset.There was no focality noted. There has been no fever. Pertinent negatives include no shortness of breath, no chest pain and no headaches. There were no medications administered prior to arrival. Associated medical issues do not include trauma.    Past Medical History:  Diagnosis Date  . Anxiety   . Aortic stenosis   . Arthritis   . Chronic systolic heart failure ([place])   . CKD (chronic kidney disease) stage 3, GFR 30-59 ml/min (HCC)   . Constipation   . Depression   . Diabetes mellitus, type 2 ([place])   . Essential hypertension, benign   . Hyperlipidemia   . Incidental pulmonary nodule, > 58mm and < 31mm    7 x 4 mm LLL nodule  . LBBB (left bundle branch block)   . Nonischemic cardiomyopathy (HCC)    No significant obstructive CAD at heart catheterization 05/2014, LVEF 20%  . Obesity   . Pneumonia 2013  . 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 Symptomatic PVCs     Patient Active Problem List   Diagnosis Date Noted  . Screening for depression 02/10/2018  . Osteoarthritis of left knee 03/16/2017  . Pain in finger of left hand 12/14/2016  . 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 Severe aortic valve stenosis 03/24/2015  . Hyperlipidemia LDL goal <100 05/24/2014  . Severe aortic stenosis 05/23/2014  .  Incidental pulmonary nodule, > 25mm and < 50mm 02/10/2014  . Chronic combined systolic and diastolic CHF (congestive heart failure) ([place])   . LBBB (left bundle branch block) 12/03/2013  . 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 Seasonal allergies 02/22/2012  . Essential hypertension, benign   . Nonischemic cardiomyopathy ([place]) 10/14/2011  . Type 2 diabetes mellitus with nephropathy ([place]) 10/24/2007  . Obesity 10/24/2007    Past Surgical History:  Procedure Laterality Date  . CARDIAC CATHETERIZATION    . CESAREAN SECTION  1987  . FLEXIBLE BRONCHOSCOPY N/A 01/14/2015   Procedure: FLEXIBLE BRONCHOSCOPY;  Surgeon: [name], MD;  Location: [place];  Service: Thoracic;  Laterality: N/A;  . LEFT AND RIGHT HEART CATHETERIZATION WITH CORONARY ANGIOGRAM N/A 12/05/2011   Procedure: LEFT AND RIGHT HEART CATHETERIZATION WITH CORONARY ANGIOGRAM;  Surgeon: [name], MD;  Location[facility] CATH LAB;  Service: Cardiovascular;  Laterality: N/A;  . LEFT AND RIGHT HEART CATHETERIZATION WITH CORONARY ANGIOGRAM N/A 05/23/2014   Procedure: LEFT AND RIGHT HEART CATHETERIZATION WITH CORONARY ANGIOGRAM;  Surgeon: [name], MD;  Location: [facility] CATH LAB;  Service: Cardiovascular;  Laterality: N/A;  . MULTIPLE EXTRACTIONS WITH ALVEOLOPLASTY N/A 02/27/2014   Procedure: Extraction of tooth #'s 2,4,5,6,7,8,9,10,11,12, 22, 23, 24, 25, 26, 28 with alveoloplasty and bilateral mandibular tori reductions;  Surgeon: [name], DDS;  Location: [place];  Service: Oral Surgery;  Laterality: N/A;  . RIGID BRONCHOSCOPY N/A 01/14/2015   Procedure: RIGID BRONCHOSCOPY with Removal Of Foreign Body ;  Surgeon: [name], MD;  Location: [place];  Service: Thoracic;  Laterality: N/A;  . TEE WITHOUT CARDIOVERSION N/A 03/24/2015   Procedure: TRANSESOPHAGEAL ECHOCARDIOGRAM (TEE);  Surgeon: [name], MD;  Location: [place];  Service: Open Heart Surgery;  Laterality: N/A;    . TRANSCATHETER AORTIC VALVE REPLACEMENT, TRANSFEMORAL Bilateral 03/24/2015   Procedure: TRANSCATHETER AORTIC VALVE REPLACEMENT, TRANSFEMORAL;  Surgeon: [name], MD;  Location: [place];  Service: Open Heart Surgery;  Laterality: Bilateral;  . TUBAL LIGATION  1987  . VIDEO BRONCHOSCOPY Bilateral 06/05/2014   Procedure: VIDEO BRONCHOSCOPY WITHOUT FLUORO;  Surgeon: [name], MD;  Location[facility] ENDOSCOPY;  Service: Cardiopulmonary;  Laterality: Bilateral;     OB History   None      Home Medications    Prior to Admission medications   Medication Sig Start Date End Date Taking? Authorizing Provider  acetaminophen (TYLENOL) 500 MG tablet Take 1,000 mg by mouth every 6 (six) hours as needed for moderate pain.   Yes [provider]  aspirin EC 81 MG tablet Take 81 mg by mouth every morning.    Yes [provider]  carvedilol (COREG) 25 MG tablet TAKE 1 TABLET BY MOUTH TWICE DAILY WITH MEALS. Patient taking differently: Take 25 mg by mouth 2 (two) times daily with a meal.  01/26/18  Yes [name], MD  ezetimibe (ZETIA) 10 MG tablet Take 1 tablet (10 mg total) by mouth daily. Patient taking differently: Take 10 mg by mouth every evening.  06/27/18  Yes Fayrene Helper, MD  fenofibrate (TRICOR) 145 MG tablet TAKE (1) TABLET BY MOUTH ONCE DAILY. Patient taking differently: Take 145 mg by mouth every morning.  05/18/18  Yes Fayrene Helper, MD  fluticasone (FLONASE) 50 MCG/ACT nasal spray Place 2 sprays into both nostrils daily. Patient taking differently: Place 2 sprays into both nostrils daily as needed for allergies or rhinitis.  12/06/17  Yes Fayrene Helper, MD  furosemide (LASIX) 40 MG tablet TAKE 1 TABLET BY MOUTH EVERY MORNING. Patient taking differently: Take 40 mg by mouth every morning.  05/18/18  Yes Fayrene Helper, MD  glipiZIDE (GLIPIZIDE XL) 5 MG 24 hr tablet Take 1 tablet (5 mg total) by mouth daily with breakfast. 12/25/17   Yes Fayrene Helper, MD  lidocaine-prilocaine (EMLA) cream Apply 1-2 application topically 3 (three) times daily as needed. 08/01/17  Yes [provider]  Multiple Vitamins-Minerals (MULTIVITAMIN PO) Take 1 tablet by mouth daily.   Yes [provider]  niacin (NIASPAN) 500 MG CR tablet 1 tablet at bedtime Patient taking differently: Take 500 mg by mouth at bedtime.  06/27/18  Yes Fayrene Helper, MD  NOVOLOG MIX 70/30 FLEXPEN (70-30) 100 UNIT/ML FlexPen INJECT 12 UNITS SUBCUTANEOUSLY TWICE DAILY. Patient taking differently: Inject 12-15 Units into the skin See admin instructions. 15 units in the morning and 12 units at bedtime 06/05/18  Yes Fayrene Helper, MD  Pitavastatin Calcium (LIVALO) 2 MG TABS TAKE ONE TABLET BY MOUTH ONCE DAILY AFTER SUPPER. Patient taking differently: Take 2 mg by mouth daily after supper. TAKE ONE TABLET BY MOUTH ONCE DAILY AFTER SUPPER. 04/20/18  Yes Fayrene Helper, MD  potassium chloride (K-DUR) 10 MEQ tablet TAKE ONE TABLET BY MOUTH DAILY. DO NOT TAKE IF NOT TAKING FUROSEMIDE. Patient taking differently: Take 10 mEq  by mouth every morning. TAKE ONE TABLET BY MOUTH DAILY. DO NOT TAKE IF NOT TAKING FUROSEMIDE. 04/30/18  Yes Fayrene Helper, MD  spironolactone (ALDACTONE) 25 MG tablet Take 1 tablet (25 mg total) by mouth daily. 06/27/18  Yes Fayrene Helper, MD  doxycycline (VIBRAMYCIN) 100 MG capsule Take 1 capsule (100 mg total) by mouth 2 (two) times daily. One po bid x 7 days 07/08/18   Milton Ferguson, MD  glucose blood (ONE TOUCH ULTRA TEST) test strip Use as instructed three times daily dx: e11.65 03/15/17   Fayrene Helper, MD  ibuprofen (ADVIL,MOTRIN) 400 MG tablet Take 1 tablet (400 mg total) by mouth 3 (three) times daily. Patient not taking: Reported on 06/27/2018 03/06/18   Mesner, Corene Cornea, MD  Lancets (ONETOUCH DELICA PLUS IONGEX52W) [place] USE 3 TIMES A DAY AS DIRECTED. 06/08/18   Fayrene Helper, MD  LITETOUCH PEN  NEEDLES 31G X 8 MM MISC USE AS NEEDED TO INJECT INSULIN. 06/05/18   Fayrene Helper, MD  ONE TOUCH ULTRA TEST test strip CHECK BLOOD SUGAR 3 TIMES A DAY. 11/16/17   Fayrene Helper, MD  cetirizine (ZYRTEC) 10 MG tablet Take 1 tablet (10 mg total) by mouth daily. 06/30/11 04/19/18  Fayrene Helper, MD  ferrous sulfate 325 (65 FE) MG EC tablet Take 1 tablet (325 mg total) by mouth 2 (two) times daily. 07/14/11 10/28/11  Fayrene Helper, MD  FLUoxetine (PROZAC) 10 MG tablet Take 1 tablet (10 mg total) by mouth daily. Take one capsule by mouth once a day 12/20/10 11/03/11  Fayrene Helper, MD    Family History Family History  Problem Relation Age of Onset  . Heart disease Mother   . Heart attack Mother 52  . Diabetes Mother   . Hypertension Mother   . Colon cancer Father 1  . Breast cancer Sister   . Diabetes Brother   . Hypertension Brother   . Stroke Brother   . Cancer Brother   . Congestive Heart Failure Daughter   . Hypertension Son   . High Cholesterol Son   . Congestive Heart Failure Son     Social History Social History   Tobacco Use  . Smoking status: Never Smoker  . Smokeless tobacco: Never Used  Substance Use Topics  . Alcohol use: No  . Drug use: No     Allergies   Crestor [rosuvastatin]; Tramadol; and Penicillins   Review of Systems Review of Systems  Constitutional: Negative for appetite change and fatigue.  HENT: Negative for congestion, ear discharge and sinus pressure.   Eyes: Negative for discharge.  Respiratory: Positive for cough. Negative for shortness of breath.   Cardiovascular: Negative for chest pain.  Gastrointestinal: Negative for abdominal pain and diarrhea.  Genitourinary: Negative for frequency and hematuria.  Musculoskeletal: Negative for back pain.  Skin: Negative for rash.  Neurological: Positive for weakness. Negative for focal weakness, seizures and headaches.  Psychiatric/Behavioral: Negative for hallucinations.      Physical Exam Updated Vital Signs BP 113/60   Pulse 80   Temp 98.7 F (37.1 C) (Oral)   Resp 17   Ht 5\' 6"  (1.676 m)   Wt 84.4 kg   SpO2 97%   BMI 30.02 kg/m   Physical Exam  Constitutional: She is oriented to person, place, and time. She appears well-developed.  HENT:  Head: Normocephalic.  Eyes: Conjunctivae and EOM are normal. No scleral icterus.  Neck: Neck supple. No thyromegaly present.  Cardiovascular: Normal rate  and regular rhythm. Exam reveals no gallop and no friction rub.  No murmur heard. Pulmonary/Chest: No stridor. She has wheezes. She has no rales. She exhibits no tenderness.  Abdominal: She exhibits no distension. There is no tenderness. There is no rebound.  Musculoskeletal: Normal range of motion. She exhibits no edema.  Lymphadenopathy:    She has no cervical adenopathy.  Neurological: She is oriented to person, place, and time. She exhibits normal muscle tone. Coordination normal.  Skin: No rash noted. No erythema.  Psychiatric: She has a normal mood and affect. Her behavior is normal.     ED Treatments / Results  Labs (all labs ordered are listed, but only abnormal results are displayed) Labs Reviewed  CBC WITH DIFFERENTIAL/PLATELET - Abnormal; Notable for the following components:      Result Value   Hemoglobin 11.3 (*)    All other components within normal limits  COMPREHENSIVE METABOLIC PANEL - Abnormal; Notable for the following components:   Glucose, Bld 208 (*)    BUN 43 (*)    Creatinine, Ser 1.71 (*)    Alkaline Phosphatase 34 (*)    GFR calc non Af Amer 28 (*)    GFR calc Af Amer 33 (*)    All other components within normal limits  URINALYSIS, ROUTINE W REFLEX MICROSCOPIC - Abnormal; Notable for the following components:   Color, Urine STRAW (*)    All other components within normal limits    EKG None  Radiology Dg Chest 2 View  Result Date: 07/08/2018 CLINICAL DATA:  72 year old female with history of weakness and  aching in the upper back. EXAM: CHEST - 2 VIEW COMPARISON:  Chest x-ray 01/01/2016. FINDINGS: Diffuse peribronchial cuffing. Lung volumes are normal. Linear opacity in the left mid lung likely to reflect subsegmental atelectasis or mild scarring. No acute consolidative airspace disease. No evidence of pulmonary edema. Heart size is mildly enlarged. Upper mediastinal contours are within normal limits. Aortic atherosclerosis. IMPRESSION: 1. Diffuse peribronchial cuffing concerning for an acute bronchitis. 2. Aortic atherosclerosis. 3. Mild cardiomegaly. Electronically Signed   By: Vinnie Langton M.D.   On: 07/08/2018 17:38   Dg Knee Complete 4 Views Right  Result Date: 07/08/2018 CLINICAL DATA:  Right knee pain.  No known injury. EXAM: RIGHT KNEE - COMPLETE 4+ VIEW COMPARISON:  None. FINDINGS: The mineralization and alignment are normal. There is no evidence of acute fracture or dislocation. There is advanced medial compartment joint space narrowing with bone-on-bone apposition, subchondral eburnation and small osteophytes. There are mild patellofemoral degenerative changes. There is a small knee joint effusion. IMPRESSION: No acute osseous findings. Advanced medial compartment osteoarthritis. Small knee joint effusion. Electronically Signed   By: Richardean Sale M.D.   On: 07/08/2018 17:38    Procedures Procedures (including critical care time)  Medications Ordered in ED Medications - No data to display   Initial Impression / Assessment and Plan / ED Course  I have reviewed the triage vital signs and the nursing notes.  Pertinent labs & imaging results that were available during my care of the patient were reviewed by me and considered in my medical decision making (see chart for details).     Labs show no acute changes.  Chest x-ray showed bronchitis.  Patient will be placed on doxycycline and will follow-up her PCP  Final Clinical Impressions(s) / ED Diagnoses   Final diagnoses:   Bronchitis    ED Discharge Orders         Ordered  doxycycline (VIBRAMYCIN) 100 MG capsule  2 times daily     07/08/18 Luanna Cole, MD 07/08/18 1925

## 2018-07-08 NOTE — ED Notes (Signed)
Pt ambulatory to waiting room. Pt verbalized understanding of discharge instructions.   

## 2018-07-08 NOTE — ED Triage Notes (Signed)
Patient c/o generalized weakness, loss of appetite, and body aches that started at the beginning of the weakness. Patient denies any nausea, vomiting, diarrhea, or fevers. Per patient upper back pain and neck pain. Patient also states headache and "dizziness with bending over." Per patient headache has eased today. Denies any chest pain or shortness of breath.

## 2018-07-10 ENCOUNTER — Telehealth: Payer: Self-pay | Admitting: *Deleted

## 2018-07-10 NOTE — Telephone Encounter (Signed)
Pt was saw in ER on Sunday. Told her it was bronchitis and put her on 7 day antibiotic. She is feeling better wanted to know if she needed to come in and follow up with Dr. Moshe Cipro.

## 2018-07-10 NOTE — Telephone Encounter (Signed)
Not if she was just seen in the ER. Only needs to follow up if she is not getting better

## 2018-07-18 ENCOUNTER — Ambulatory Visit: Payer: PPO | Admitting: Orthopedic Surgery

## 2018-07-18 ENCOUNTER — Encounter: Payer: Self-pay | Admitting: Orthopedic Surgery

## 2018-07-18 VITALS — BP 111/79 | HR 82 | Ht 66.0 in | Wt 177.0 lb

## 2018-07-18 DIAGNOSIS — M1711 Unilateral primary osteoarthritis, right knee: Secondary | ICD-10-CM

## 2018-07-18 DIAGNOSIS — M1710 Unilateral primary osteoarthritis, unspecified knee: Secondary | ICD-10-CM

## 2018-07-18 NOTE — Patient Instructions (Addendum)
[name] [name] needs a knee replacement.  Because of her heart condition and history she would have to have this at a [facility] such as [facility] or [facility]    Total Knee Replacement Total knee replacement is a procedure to replace the knee joint with an artificial (prosthetic) knee joint. The purpose of this surgery is to reduce knee pain and improve knee function. The prosthetic knee joint (prosthesis) may be made of metal, plastic or ceramic. It replaces parts of the thigh bone (femur), lower leg bone (tibia), and kneecap (patella) that are removed during the procedure. Tell a health care provider about:  Any allergies you have.  All medicines you are taking, including vitamins, herbs, eye drops, creams, and over-the-counter medicines.  Any problems you or family members have had with anesthetic medicines.  Any blood disorders you have.  Any surgeries you have had.  Any medical conditions you have.  Whether you are pregnant or may be pregnant. What are the risks? Generally, this is a safe procedure. However, problems may occur, including:  Infection.  Bleeding.  Allergic reactions to medicines.  Damage to other structures or organs.  Decreased range of motion of the knee.  Instability of the knee.  Loosening of the prosthetic joint.  Knee pain that does not go away (chronic pain).  What happens before the procedure?  Ask your health care provider about: ? Changing or stopping your regular medicines. This is especially important if you are taking diabetes medicines or blood thinners. ? Taking medicines such as aspirin and ibuprofen. These medicines can thin your blood. Do not take these medicines before your procedure if your health care provider instructs you not to.  Have dental care and routine cleanings completed before your procedure. Plan to not have dental work done for 3 months after your procedure. Germs from anywhere  in your body, including your mouth, can travel to your new joint and infect it.  Follow instructions from your health care provider about eating or drinking restrictions.  Ask your health care provider how your surgical site will be marked or identified.  You may be given antibiotic medicine to help prevent infection.  If your health care provider prescribes physical therapy, do exercises as instructed.  Do not use any tobacco products, such as cigarettes, chewing tobacco, or e-cigarettes. If you need help quitting, ask your health care provider.  You may have a physical exam.  You may have tests, such as: ? X-rays. ? MRI. ? CT scan. ? Bone scans.  You may have a blood or urine sample taken.  Plan to have someone take you home after the procedure.  If you will be going home right after the procedure, plan to have someone with you for at least 24 hours. It is recommended that you have someone to help care for you for at least 4-6 weeks after your procedure. What happens during the procedure?  To reduce your risk of infection: ? Your health care team will wash or sanitize their hands. ? Your skin will be washed with soap.  An IV tube will be inserted into one of your veins.  You will be given one or more of the following: ? A medicine to help you relax (sedative). ? A medicine to numb the area (local anesthetic). ? A medicine to make you fall asleep (general anesthetic). ? A medicine that is injected into your spine to numb the area below and slightly above the  injection site (spinal anesthetic). ? A medicine that is injected into an area of your body to numb everything below the injection site (regional anesthetic).  An incision will be made in your knee.  Damaged cartilage and bone will be removed from your femur, tibia, and patella.  Parts of the prosthesis (liners) will be placed over the areas of bone and cartilage that were removed. A metal liner will be placed over  your femur, and plastic liners will be placed over your tibia and the underside of your patella.  One or more small tubes (drains) may be placed near your incision to help drain extra fluid from your surgical site.  Your incision will be closed with stitches (sutures), skin glue, or adhesive strips. Medicine may be applied to your incision.  A bandage (dressing) will be placed over your incision. The procedure may vary among health care providers and hospitals. What happens after the procedure?  Your blood pressure, heart rate, breathing rate, and blood oxygen level will be monitored often until the medicines you were given have worn off.  You may continue to receive fluids and medicines through an IV tube.  You will have some pain. Pain medicines will be available to help you.  You may have fluid coming from one or more drains in your incision.  You may have to wear compression stockings. These stockings help to prevent blood clots and reduce swelling in your legs.  You will be encouraged to move around as much as possible.  You may be given a continuous passive motion machine to use at home. You will be shown how to use this machine.  Do not drive for 24 hours if you received a sedative. This information is not intended to replace advice given to you by your health care provider. Make sure you discuss any questions you have with your health care provider. Document Released: 11/07/2000 Document Revised: 09/03/2016 Document Reviewed: 07/08/2015 Elsevier Interactive Patient Education  [employer].

## 2018-07-18 NOTE — Progress Notes (Signed)
RECURRENT PROBLEM OFFICE VISIT  Chief Complaint  Patient presents with  . Knee Pain    right     72 year old female with history of aortic stenosis systolic heart failure grade 3 kidney disease aortic valve replacement nonischemic cardiomyopathy and diabetes amongst other medical problems presents with a history of chronic right and left knee pain with acute exacerbation of right knee pain back in late November requiring her to go to the ER.  Symptoms are somewhat better at this time however, the right knee pain at the time of the ER visit was severe currently mild to moderate described as a dull ache associated with painful weightbearing and painful range of motion.   Review of Systems  Respiratory: Negative for shortness of breath.   Cardiovascular: Negative for chest pain.  Neurological: Negative for tingling.     Past Medical History:  Diagnosis Date  . Anxiety   . Aortic stenosis   . Arthritis   . Chronic systolic heart failure ([place])   . CKD (chronic kidney disease) stage 3, GFR 30-59 ml/min (HCC)   . Constipation   . Depression   . Diabetes mellitus, type 2 ([place])   . Essential hypertension, benign   . Hyperlipidemia   . Incidental pulmonary nodule, > 6mm and < 71mm    7 x 4 mm LLL nodule  . LBBB (left bundle branch block)   . Nonischemic cardiomyopathy (HCC)    No significant obstructive CAD at heart catheterization 05/2014, LVEF 20%  . Obesity   . Pneumonia 2013  . 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 Symptomatic PVCs     Past Surgical History:  Procedure Laterality Date  . CARDIAC CATHETERIZATION    . CESAREAN SECTION  1987  . FLEXIBLE BRONCHOSCOPY N/A 01/14/2015   Procedure: FLEXIBLE BRONCHOSCOPY;  Surgeon: [name], MD;  Location: [place];  Service: Thoracic;  Laterality: N/A;  . LEFT AND RIGHT HEART CATHETERIZATION WITH CORONARY  ANGIOGRAM N/A 12/05/2011   Procedure: LEFT AND RIGHT HEART CATHETERIZATION WITH CORONARY ANGIOGRAM;  Surgeon: [name], MD;  Location: [facility] C[facility] LAB;  Service: Cardiovascular;  Laterality: N/A;  . LEFT AND RIGHT HEART CATHETERIZATION WITH CORONARY ANGIOGRAM N/A 05/23/2014   Procedure: LEFT AND RIGHT HEART CATHETERIZATION WITH CORONARY ANGIOGRAM;  Surgeon: [name], MD;  Location: [facility] CATH LAB;  Service: Cardiovascular;  Laterality: N/A;  . MULTIPLE EXTRACTIONS WITH ALVEOLOPLASTY N/A 02/27/2014   Procedure: Extraction of tooth #'s 2,4,5,6,7,8,9,10,11,12, 22, 23, 24, 25, 26, 28 with alveoloplasty and bilateral mandibular tori reductions;  Surgeon: [name], DDS;  Location: [place];  Service: Oral Surgery;  Laterality: N/A;  . RIGID BRONCHOSCOPY N/A 01/14/2015   Procedure: RIGID BRONCHOSCOPY with Removal Of Foreign Body ;  Surgeon: [name], MD;  Location: [place];  Service: Thoracic;  Laterality: N/A;  . TEE WITHOUT CARDIOVERSION N/A 03/24/2015   Procedure: TRANSESOPHAGEAL ECHOCARDIOGRAM (TEE);  Surgeon: [name], MD;  Location: [place];  Service: Open Heart Surgery;  Laterality: N/A;  . TRANSCATHETER AORTIC VALVE REPLACEMENT, TRANSFEMORAL Bilateral 03/24/2015   Procedure: TRANSCATHETER AORTIC VALVE REPLACEMENT, TRANSFEMORAL;  Surgeon: [name], MD;  Location: [place];  Service: Open Heart Surgery;  Laterality: Bilateral;  . TUBAL LIGATION  1987  . VIDEO BRONCHOSCOPY Bilateral 06/05/2014   Procedure: VIDEO BRONCHOSCOPY WITHOUT FLUORO;  Surgeon: [name], MD;  Location: [place];  Service:  Cardiopulmonary;  Laterality: Bilateral;    Family History  Problem Relation Age of Onset  . Heart disease Mother   . Heart attack Mother 59  . Diabetes Mother   . Hypertension Mother   . Colon cancer Father 23  . Breast cancer Sister   . Diabetes Brother   . Hypertension Brother   . Stroke Brother   . Cancer Brother   . Congestive  Heart Failure Daughter   . Hypertension Son   . High Cholesterol Son   . Congestive Heart Failure Son    Social History   Tobacco Use  . Smoking status: Never Smoker  . Smokeless tobacco: Never Used  Substance Use Topics  . Alcohol use: No  . Drug use: No    Allergies  Allergen Reactions  . Crestor [Rosuvastatin] Other (See Comments)    Muscle aches and elevated CK  . Tramadol Nausea Only    States blood sugar elevated also  . Penicillins Other (See Comments)    Family unsure of reaction, but certain mom said she was allergic    Current Meds  Medication Sig  . acetaminophen (TYLENOL) 500 MG tablet Take 1,000 mg by mouth every 6 (six) hours as needed for moderate pain.  Marland Kitchen aspirin EC 81 MG tablet Take 81 mg by mouth every morning.   . carvedilol (COREG) 25 MG tablet TAKE 1 TABLET BY MOUTH TWICE DAILY WITH MEALS.  Marland Kitchen ezetimibe (ZETIA) 10 MG tablet Take 1 tablet (10 mg total) by mouth daily.  . fenofibrate (TRICOR) 145 MG tablet TAKE (1) TABLET BY MOUTH ONCE DAILY.  . fluticasone (FLONASE) 50 MCG/ACT nasal spray Place 2 sprays into both nostrils daily.  . furosemide (LASIX) 40 MG tablet TAKE 1 TABLET BY MOUTH EVERY MORNING.  Marland Kitchen glipiZIDE (GLIPIZIDE XL) 5 MG 24 hr tablet Take 1 tablet (5 mg total) by mouth daily with breakfast.  . glucose blood (ONE TOUCH ULTRA TEST) test strip Use as instructed three times daily dx: e11.65  . ibuprofen (ADVIL,MOTRIN) 400 MG tablet Take 1 tablet (400 mg total) by mouth 3 (three) times daily.  . Lancets (ONETOUCH DELICA PLUS [id number]) MISC USE 3 TIMES A DAY AS DIRECTED.  Marland Kitchen lidocaine-prilocaine (EMLA) cream Apply 1-2 application topically 3 (three) times daily as needed.  Marland Kitchen LITETOUCH PEN NEEDLES 31G X 8 MM MISC USE AS NEEDED TO INJECT INSULIN.  . Multiple Vitamins-Minerals (MULTIVITAMIN PO) Take 1 tablet by mouth daily.  . niacin (NIASPAN) 500 MG CR tablet 1 tablet at bedtime  . NOVOLOG MIX 70/30 FLEXPEN (70-30) 100 UNIT/ML FlexPen INJECT 12 UNITS  SUBCUTANEOUSLY TWICE DAILY.  . ONE TOUCH ULTRA TEST test strip CHECK BLOOD SUGAR 3 TIMES A DAY.  Marland Kitchen Pitavastatin Calcium (LIVALO) 2 MG TABS TAKE ONE TABLET BY MOUTH ONCE DAILY AFTER SUPPER.  Marland Kitchen potassium chloride (K-DUR) 10 MEQ tablet TAKE ONE TABLET BY MOUTH DAILY. DO NOT TAKE IF NOT TAKING FUROSEMIDE.  Marland Kitchen spironolactone (ALDACTONE) 25 MG tablet Take 1 tablet (25 mg total) by mouth daily.    BP 111/79   Pulse 82   Ht 5\' 6"  (1.676 m)   Wt 177 lb (80.3 kg)   BMI 28.57 kg/m   Physical Exam  Constitutional: She is oriented to person, place, and time. She appears well-developed and well-nourished.  Neurological: She is alert and oriented to person, place, and time.  Psychiatric: She has a normal mood and affect. Judgment normal.  Vitals reviewed.   Ortho Exam  Ambulatory status no support is  needed at this time there is no major limp  Right knee is tender over the medial joint line with flexion arc of 120 degrees knee strength is normal ligaments are stable pulse and perfusion distally is normal and there are no sensory deficits Skin is normal  Left knee 120 degrees of knee flexion No tenderness Quadricep strength is normal Ligaments are stable Skin is normal Pulse perfusion color temperature normal without edema Normal sensation  MEDICAL DECISION SECTION  Xrays were done at [facility]  My independent reading of xrays:  4 views of the knee osteoarthritis moderate to severe  Encounter Diagnosis  Name Primary?  . Primary localized osteoarthritis of knee Yes    PLAN: (Rx., injectx, surgery, frx, mri/ct) Inject right knee Procedure note right knee injection verbal consent was obtained to inject right knee joint  Timeout was completed to confirm the site of injection  The medications used were 40 mg of Depo-Medrol and 1% lidocaine 3 cc  Anesthesia was provided by ethyl chloride and the skin was prepped with alcohol.  After cleaning the skin with alcohol a  20-gauge needle was used to inject the right knee joint. There were no complications. A sterile bandage was applied.   Recommend total knee replacement for this patient however will need to be done at a facility that can deal with her cardiac issues.  She will discuss with her family  No orders of the defined types were placed in this encounter.   Arther Abbott, MD  07/18/2018 11:42 AM

## 2018-07-26 ENCOUNTER — Other Ambulatory Visit: Payer: Self-pay

## 2018-07-26 ENCOUNTER — Telehealth: Payer: Self-pay | Admitting: *Deleted

## 2018-07-26 ENCOUNTER — Other Ambulatory Visit: Payer: Self-pay | Admitting: Family Medicine

## 2018-07-26 MED ORDER — INSULIN ASPART PROT & ASPART (70-30 MIX) 100 UNIT/ML PEN: 12.0000 [IU] | PEN_INJECTOR | Freq: Two times a day (BID) | SUBCUTANEOUS | 1 refills | Status: DC

## 2018-07-26 NOTE — Telephone Encounter (Signed)
Pt was calling about her insulin that she takes. Dr. Moshe Cipro up'd her dose from 12 to 15 and she wanted a nurse to call her because once she runs out the pharmacy will not fill again. She would like a call back.

## 2018-07-26 NOTE — Telephone Encounter (Signed)
Spoke with patient and she stated Dr.Simpson increased dose from 12 units bid to 12 units q am and 15 units q pm. New script sent in with those directions.

## 2018-07-30 ENCOUNTER — Telehealth: Payer: Self-pay | Admitting: Family Medicine

## 2018-07-30 NOTE — Telephone Encounter (Signed)
insulin aspart protamine - aspart (NOVOLOG MIX 70/30 FLEXPEN) (70-30) 100 UNIT/ML FlexPen     I called [facility]--they are filling the Novolog--

## 2018-08-03 ENCOUNTER — Other Ambulatory Visit: Payer: Self-pay

## 2018-08-03 MED ORDER — GLUCOSE BLOOD VI STRP: ORAL_STRIP | 5 refills | Status: DC

## 2018-08-13 ENCOUNTER — Other Ambulatory Visit: Payer: Self-pay | Admitting: Cardiology

## 2018-08-23 ENCOUNTER — Other Ambulatory Visit: Payer: Self-pay | Admitting: Family Medicine

## 2018-09-21 ENCOUNTER — Other Ambulatory Visit: Payer: Self-pay | Admitting: Family Medicine

## 2018-09-21 DIAGNOSIS — E785 Hyperlipidemia, unspecified: Secondary | ICD-10-CM

## 2018-09-30 ENCOUNTER — Emergency Department (HOSPITAL_COMMUNITY)
Admission: EM | Admit: 2018-09-30 | Discharge: 2018-10-01 | Disposition: A | Discharge status: Home / Self Care | Payer: PPO | Attending: Emergency Medicine | Admitting: Emergency Medicine

## 2018-09-30 ENCOUNTER — Emergency Department (HOSPITAL_COMMUNITY): Payer: PPO

## 2018-09-30 ENCOUNTER — Encounter (HOSPITAL_COMMUNITY): Payer: Self-pay | Admitting: Emergency Medicine

## 2018-09-30 ENCOUNTER — Other Ambulatory Visit: Payer: Self-pay

## 2018-09-30 DIAGNOSIS — E1122 Type 2 diabetes mellitus with diabetic chronic kidney disease: Secondary | ICD-10-CM | POA: Diagnosis not present

## 2018-09-30 DIAGNOSIS — Z79899 Other long term (current) drug therapy: Secondary | ICD-10-CM | POA: Diagnosis not present

## 2018-09-30 DIAGNOSIS — I1310 Hypertensive heart and chronic kidney disease without heart failure, with stage 1 through stage 4 chronic kidney disease, or unspecified chronic kidney disease: Secondary | ICD-10-CM | POA: Insufficient documentation

## 2018-09-30 DIAGNOSIS — N184 Chronic kidney disease, stage 4 (severe): Secondary | ICD-10-CM | POA: Diagnosis not present

## 2018-09-30 DIAGNOSIS — Z7982 Long term (current) use of aspirin: Secondary | ICD-10-CM | POA: Diagnosis not present

## 2018-09-30 DIAGNOSIS — Z794 Long term (current) use of insulin: Secondary | ICD-10-CM | POA: Diagnosis not present

## 2018-09-30 DIAGNOSIS — I5042 Chronic combined systolic (congestive) and diastolic (congestive) heart failure: Secondary | ICD-10-CM | POA: Insufficient documentation

## 2018-09-30 DIAGNOSIS — R079 Chest pain, unspecified: Secondary | ICD-10-CM | POA: Diagnosis not present

## 2018-09-30 DIAGNOSIS — R0789 Other chest pain: Secondary | ICD-10-CM | POA: Diagnosis not present

## 2018-09-30 DIAGNOSIS — J9809 Other diseases of bronchus, not elsewhere classified: Secondary | ICD-10-CM | POA: Diagnosis not present

## 2018-09-30 LAB — CBC
HCT: 35.7 % — ABNORMAL LOW (ref 36.0–46.0)
Hemoglobin: 11.2 g/dL — ABNORMAL LOW (ref 12.0–15.0)
MCH: 27.1 pg (ref 26.0–34.0)
MCHC: 31.4 g/dL (ref 30.0–36.0)
MCV: 86.2 fL (ref 80.0–100.0)
Platelets: 181 10*3/uL (ref 150–400)
RBC: 4.14 MIL/uL (ref 3.87–5.11)
RDW: 16 % — ABNORMAL HIGH (ref 11.5–15.5)
WBC: 6.7 10*3/uL (ref 4.0–10.5)
nRBC: 0 % (ref 0.0–0.2)

## 2018-09-30 MED ORDER — METHOCARBAMOL 500 MG PO TABS
500.0000 mg | ORAL_TABLET | Freq: Once | ORAL | Status: AC
  Administered 2018-09-30: 500 mg via ORAL
  Filled 2018-09-30: qty 1

## 2018-09-30 MED ORDER — SODIUM CHLORIDE 0.9% FLUSH
3.0000 mL | Freq: Once | INTRAVENOUS | Status: AC
  Administered 2018-09-30: 3 mL via INTRAVENOUS

## 2018-09-30 NOTE — ED Provider Notes (Signed)
[facility] EMERGENCY DEPARTMENT Provider Note   CSN: [id number] Arrival date & time: 09/30/18  2144  Time seen 23:05 PM   History   Chief Complaint Chief Complaint  Patient presents with  . Chest Pain    HPI [name] [name] is a 73 y.o. female.  HPI patient states 3 to 4 days ago she had pain in her left elbow for 1 to 2 days and describes it as a soreness that hurt when she moved her elbow.  She states she had something similar a year ago and had inflammation in her elbow.  She states the pain is now gone in her elbow and for the past 2 days she has now had pain in her upper abdomen and her left side of her chest again described as a constant soreness that is worse when she moves certain ways.  She states she is never had this discomfort before.  She denies nausea, vomiting, diarrhea, shortness of breath, swelling of her legs, fever, dysuria, frequency, or constipation.  She did have bronchitis recently and states her cough is almost gone now.  She has had normal appetite and does not relate her discomfort to eating.  Last night her CBG did drop to 79 but she did not eat much for dinner.  She states tonight her CBG was 166.  She denies any change in her activity.  She states tonight she bent over to pick up something and the pain got intense.  PCP Fayrene Helper, MD Cardiology Dr. [name]  Past Medical History:  Diagnosis Date  . Anxiety   . Aortic stenosis   . Arthritis   . Chronic systolic heart failure ([place])   . CKD (chronic kidney disease) stage 3, GFR 30-59 ml/min (HCC)   . Constipation   . Depression   . Diabetes mellitus, type 2 ([place])   . Essential hypertension, benign   . Hyperlipidemia   . Incidental pulmonary nodule, > 39mm and < 21mm    7 x 4 mm LLL nodule  . LBBB (left bundle branch block)   . Nonischemic cardiomyopathy (HCC)    No significant obstructive CAD at heart catheterization 05/2014, LVEF 20%  . Obesity   . Pneumonia 2013  . 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 Symptomatic PVCs     Patient Active Problem List   Diagnosis Date Noted  . Screening for depression 02/10/2018  . Osteoarthritis of left knee 03/16/2017  . Pain in finger of left hand 12/14/2016  . 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 Severe aortic valve stenosis 03/24/2015  . Hyperlipidemia LDL goal <100 05/24/2014  . Severe aortic stenosis 05/23/2014  . Incidental pulmonary nodule, > 50mm and < 57mm 02/10/2014  . Chronic combined systolic and diastolic CHF (congestive heart failure) ([place])   . LBBB (left bundle branch block) 12/03/2013  . Type 2 DM with CKD stage 4 and hypertension ([place]) 05/21/2013  . Seasonal allergies 02/22/2012  . Essential hypertension, benign   . Nonischemic cardiomyopathy ([place]) 10/14/2011  . Type 2 diabetes mellitus with nephropathy ([place]) 10/24/2007  . Obesity 10/24/2007    Past Surgical History:  Procedure Laterality Date  . CARDIAC CATHETERIZATION    . CESAREAN SECTION  1987  . FLEXIBLE BRONCHOSCOPY N/A 01/14/2015   Procedure: FLEXIBLE BRONCHOSCOPY;  Surgeon: [name], MD;  Location: [place];  Service: Thoracic;  Laterality:  N/A;  . LEFT AND RIGHT HEART CATHETERIZATION WITH CORONARY ANGIOGRAM N/A 12/05/2011   Procedure: LEFT AND RIGHT HEART CATHETERIZATION WITH CORONARY ANGIOGRAM;  Surgeon: [name], MD;  Location: [facility] CATH LAB;  Service: Cardiovascular;  Laterality: N/A;  . LEFT AND RIGHT HEART CATHETERIZATION WITH CORONARY ANGIOGRAM N/A 05/23/2014   Procedure: LEFT AND RIGHT HEART CATHETERIZATION WITH CORONARY ANGIOGRAM;  Surgeon: [name], MD;  Location: [facility] CATH LAB;  Service: Cardiovascular;  Laterality: N/A;  . MULTIPLE EXTRACTIONS WITH ALVEOLOPLASTY N/A 02/27/2014   Procedure: Extraction of tooth #'s 2,4,5,6,7,8,9,10,11,12, 22, 23, 24, 25, 26, 28  with alveoloplasty and bilateral mandibular tori reductions;  Surgeon: [name], DDS;  Location: [place];  Service: Oral Surgery;  Laterality: N/A;  . RIGID BRONCHOSCOPY N/A 01/14/2015   Procedure: RIGID BRONCHOSCOPY with Removal Of Foreign Body ;  Surgeon: [name], MD;  Location: [place];  Service: Thoracic;  Laterality: N/A;  . TEE WITHOUT CARDIOVERSION N/A 03/24/2015   Procedure: TRANSESOPHAGEAL ECHOCARDIOGRAM (TEE);  Surgeon: [name], MD;  Location: [place];  Service: Open Heart Surgery;  Laterality: N/A;  . TRANSCATHETER AORTIC VALVE REPLACEMENT, TRANSFEMORAL Bilateral 03/24/2015   Procedure: TRANSCATHETER AORTIC VALVE REPLACEMENT, TRANSFEMORAL;  Surgeon: [name], MD;  Location: [place];  Service: Open Heart Surgery;  Laterality: Bilateral;  . TUBAL LIGATION  1987  . VIDEO BRONCHOSCOPY Bilateral 06/05/2014   Procedure: VIDEO BRONCHOSCOPY WITHOUT FLUORO;  Surgeon: [name], MD;  Location[facility] ENDOSCOPY;  Service: Cardiopulmonary;  Laterality: Bilateral;     OB History   No obstetric history on file.      Home Medications    Prior to Admission medications   Medication Sig Start Date End Date Taking? Authorizing Provider  acetaminophen (TYLENOL) 500 MG tablet Take 1,000 mg by mouth every 6 (six) hours as needed for mild pain or moderate pain.    Yes [provider]  aspirin EC 81 MG tablet Take 81 mg by mouth every morning.    Yes [provider]  carvedilol (COREG) 25 MG tablet TAKE 1 TABLET BY MOUTH TWICE DAILY WITH MEALS. Patient taking differently: Take 25 mg by mouth 2 (two) times daily with a meal.  08/13/18  Yes [name], MD  ezetimibe (ZETIA) 10 MG tablet Take 1 tablet (10 mg total) by mouth daily. 06/27/18  Yes Fayrene Helper, MD  fenofibrate (TRICOR) 145 MG tablet TAKE (1) TABLET BY MOUTH ONCE DAILY. Patient taking differently: Take 145 mg by mouth daily.  05/18/18  Yes Fayrene Helper, MD    furosemide (LASIX) 40 MG tablet TAKE 1 TABLET BY MOUTH EVERY MORNING. Patient taking differently: Take 40 mg by mouth every morning.  05/18/18  Yes Fayrene Helper, MD  GLIPIZIDE XL 5 MG 24 hr tablet TAKE 1 TABLET BY MOUTH DAILY WITH BREAKFAST. Patient taking differently: Take 5 mg by mouth daily with breakfast.  08/23/18  Yes Fayrene Helper, MD  insulin aspart protamine - aspart (NOVOLOG MIX 70/30 FLEXPEN) (70-30) 100 UNIT/ML FlexPen Inject 0.12-0.15 mLs (12-15 Units total) into the skin 2 (two) times daily with a meal. Patient taking differently: Inject 12-15 Units into the skin See admin instructions. 15 units in the morning and 12 units in the evening 07/26/18  Yes Fayrene Helper, MD  lidocaine-prilocaine (EMLA) cream Apply 1-2 application topically 3 (three) times daily as needed. 08/01/17  Yes [provider]  Multiple Vitamins-Minerals (MULTIVITAMIN PO) Take 1 tablet by mouth every morning.  Yes [provider]  niacin (NIASPAN) 500 MG CR tablet TAKE (1) TABLET BY MOUTH AT BEDTIME. Patient taking differently: Take 500 mg by mouth at bedtime.  09/21/18  Yes Fayrene Helper, MD  Pitavastatin Calcium (LIVALO) 2 MG TABS TAKE ONE TABLET BY MOUTH ONCE DAILY AFTER SUPPER. Patient taking differently: Take 2 mg by mouth daily after supper.  07/26/18  Yes Fayrene Helper, MD  potassium chloride (K-DUR) 10 MEQ tablet TAKE ONE TABLET BY MOUTH DAILY. DO NOT TAKE IF NOT TAKING FUROSEMIDE. Patient taking differently: Take 10 mEq by mouth daily. TAKE ONE TABLET BY MOUTH DAILY. DO NOT TAKE IF NOT TAKING FUROSEMIDE. 07/26/18  Yes Fayrene Helper, MD  spironolactone (ALDACTONE) 25 MG tablet Take 1 tablet (25 mg total) by mouth daily. 06/27/18  Yes Fayrene Helper, MD  fluticasone (FLONASE) 50 MCG/ACT nasal spray Place 2 sprays into both nostrils daily. Patient taking differently: Place 2 sprays into both nostrils daily as needed for allergies or rhinitis.  12/06/17    Fayrene Helper, MD  glucose blood (ONE TOUCH ULTRA TEST) test strip Use as instructed three times daily dx: e11.65 08/03/18   Fayrene Helper, MD  ibuprofen (ADVIL,MOTRIN) 400 MG tablet Take 1 tablet (400 mg total) by mouth 3 (three) times daily. 03/06/18   Mesner, Corene Cornea, MD  Lancets [facility] DELICA PLUS [id number]) [place] USE 3 TIMES A DAY AS DIRECTED. 06/08/18   Fayrene Helper, MD  LITETOUCH PEN NEEDLES 31G X 8 MM MISC USE AS NEEDED TO INJECT INSULIN. 06/05/18   Fayrene Helper, MD  methocarbamol (ROBAXIN) 500 MG tablet Take 1 tablet (500 mg total) by mouth every 6 (six) hours as needed for muscle spasms (chest pain). 10/01/18   Rolland Porter, MD  cetirizine (ZYRTEC) 10 MG tablet Take 1 tablet (10 mg total) by mouth daily. 06/30/11 04/19/18  Fayrene Helper, MD  ferrous sulfate 325 (65 FE) MG EC tablet Take 1 tablet (325 mg total) by mouth 2 (two) times daily. 07/14/11 10/28/11  Fayrene Helper, MD  FLUoxetine (PROZAC) 10 MG tablet Take 1 tablet (10 mg total) by mouth daily. Take one capsule by mouth once a day 12/20/10 11/03/11  Fayrene Helper, MD    Family History Family History  Problem Relation Age of Onset  . Heart disease Mother   . Heart attack Mother 65  . Diabetes Mother   . Hypertension Mother   . Colon cancer Father 58  . Breast cancer Sister   . Diabetes Brother   . Hypertension Brother   . Stroke Brother   . Cancer Brother   . Congestive Heart Failure Daughter   . Hypertension Son   . High Cholesterol Son   . Congestive Heart Failure Son     Social History Social History   Tobacco Use  . Smoking status: Never Smoker  . Smokeless tobacco: Never Used  Substance Use Topics  . Alcohol use: No  . Drug use: No  Has a grandson live with her that she raised   Allergies   Crestor [rosuvastatin]; Tramadol; and Penicillins   Review of Systems Review of Systems  All other systems reviewed and are negative.    Physical Exam Updated Vital  Signs BP 129/82 (BP Location: Right Arm)   Pulse 87   Temp 98.6 F (37 C) (Oral)   Resp 18   Ht 5\' 6"  (1.676 m)   Wt 84.4 kg   SpO2 100%   BMI 30.02 kg/m  Vital signs normal    Physical Exam Vitals signs and nursing note reviewed.  Constitutional:      General: She is not in acute distress.    Appearance: Normal appearance. She is well-developed. She is not ill-appearing or toxic-appearing.  HENT:     Head: Normocephalic and atraumatic.     Right Ear: External ear normal.     Left Ear: External ear normal.     Nose: Nose normal. No mucosal edema or rhinorrhea.     Mouth/Throat:     Dentition: No dental abscesses.     Pharynx: No uvula swelling.  Eyes:     Conjunctiva/sclera: Conjunctivae normal.     Pupils: Pupils are equal, round, and reactive to light.  Neck:     Musculoskeletal: Full passive range of motion without pain, normal range of motion and neck supple.  Cardiovascular:     Rate and Rhythm: Normal rate and regular rhythm.     Heart sounds: Normal heart sounds. No murmur. No friction rub. No gallop.   Pulmonary:     Effort: Pulmonary effort is normal. No respiratory distress.     Breath sounds: Normal breath sounds. No wheezing, rhonchi or rales.  Chest:     Chest wall: No tenderness or crepitus.       Comments: Area of pain noted Abdominal:     General: Bowel sounds are normal. There is no distension.     Palpations: Abdomen is soft.     Tenderness: There is no abdominal tenderness. There is no guarding or rebound.    Musculoskeletal: Normal range of motion.        General: No tenderness.     Comments: Moves all extremities well.  She denies any pain in her left elbow now, there is no obvious swelling or joint effusion felt.  Skin:    General: Skin is warm and dry.     Coloration: Skin is not pale.     Findings: No erythema or rash.  Neurological:     Mental Status: She is alert and oriented to person, place, and time.     Cranial Nerves: No  cranial nerve deficit.  Psychiatric:        Mood and Affect: Mood is not anxious.        Speech: Speech normal.        Behavior: Behavior normal.      ED Treatments / Results  Labs (all labs ordered are listed, but only abnormal results are displayed) Results for orders placed or performed during the hospital encounter of 09/30/18  CBC  Result Value Ref Range   WBC 6.7 4.0 - 10.5 K/uL   RBC 4.14 3.87 - 5.11 MIL/uL   Hemoglobin 11.2 (L) 12.0 - 15.0 g/dL   HCT 35.7 (L) 36.0 - 46.0 %   MCV 86.2 80.0 - 100.0 fL   MCH 27.1 26.0 - 34.0 pg   MCHC 31.4 30.0 - 36.0 g/dL   RDW 16.0 (H) 11.5 - 15.5 %   Platelets 181 150 - 400 K/uL   nRBC 0.0 0.0 - 0.2 %  Troponin I - ONCE - STAT  Result Value Ref Range   Troponin I 0.03 (HH) <0.03 ng/mL  Comprehensive metabolic panel  Result Value Ref Range   Sodium 141 135 - 145 mmol/L   Potassium 3.3 (L) 3.5 - 5.1 mmol/L   Chloride 110 98 - 111 mmol/L   CO2 24 22 - 32 mmol/L   Glucose, Bld 104 (H) 70 -  99 mg/dL   BUN 33 (H) 8 - 23 mg/dL   Creatinine, Ser 2.07 (H) 0.44 - 1.00 mg/dL   Calcium 9.6 8.9 - 10.3 mg/dL   Total Protein 7.1 6.5 - 8.1 g/dL   Albumin 3.8 3.5 - 5.0 g/dL   AST 20 15 - 41 U/L   ALT 17 0 - 44 U/L   Alkaline Phosphatase 26 (L) 38 - 126 U/L   Total Bilirubin 0.5 0.3 - 1.2 mg/dL   GFR calc non Af Amer 23 (L) >60 mL/min   GFR calc Af Amer 27 (L) >60 mL/min   Anion gap 7 5 - 15  Lipase, blood  Result Value Ref Range   Lipase 22 11 - 51 U/L  Troponin I - Once  Result Value Ref Range   Troponin I 0.03 (HH) <0.03 ng/mL    Laboratory interpretation all normal except mild anemia, hypokalemia with chronic renal sufficiency so potassium wasn't given   EKG   # 1 EKG Interpretation  Date/Time:  Sunday September 30 2018 21:54:30 EST Ventricular Rate:  79 PR Interval:  142 QRS Duration: 158 QT Interval:  446 QTC Calculation: 511 R Axis:   149 Text Interpretation:  Normal sinus rhythm Right atrial enlargement Right axis  deviation Non-specific intra-ventricular conduction block Since last tracing 08 Jul 2018 change of axis of limb leads, suspect limb lead reversal Confirmed by Rolland Porter [phone]) on 09/30/2018 11:05:28 PM    #2   EKG Interpretation  Date/Time:  Sunday September 30 2018 23:27:25 EST Ventricular Rate:  68 PR Interval:  142 QRS Duration: 163 QT Interval:  472 QTC Calculation: 502 R Axis:   2 Text Interpretation:  Sinus rhythm Ventricular premature complex Left bundle branch block No significant change since last tracing 08 Jul 2018 Confirmed by Rolland Porter [phone]) on 09/30/2018 11:32:39 PM        Radiology Dg Chest 2 View  Result Date: 09/30/2018 CLINICAL DATA:  Left arm pain starting yesterday radiating to the chest. EXAM: CHEST - 2 VIEW COMPARISON:  07/08/2018 FINDINGS: Stable cardiomegaly with linear atelectasis and/or scarring in the lingula. TAVR stent noted. Diffuse interstitial prominence and mild peribronchial thickening consistent with chronic bronchitic change is noted. No alveolar confluence, edema, effusion or pneumothorax. Mild midthoracic degenerative change is seen. IMPRESSION: 1. Chronic bronchitic change of the lungs. 2. Stable cardiomegaly with TAVR. 3. Diffuse interstitial prominence and mild peribronchial thickening consistent with chronic bronchitic change is noted. Electronically Signed   By: Ashley Royalty M.D.   On: 09/30/2018 23:02    Procedures Procedures (including critical care time)  Medications Ordered in ED Medications  sodium chloride flush (NS) 0.9 % injection 3 mL (3 mLs Intravenous Given 09/30/18 2329)  methocarbamol (ROBAXIN) tablet 500 mg (500 mg Oral Given 09/30/18 2329)     Initial Impression / Assessment and Plan / ED Course  I have reviewed the triage vital signs and the nursing notes.  Pertinent labs & imaging results that were available during my care of the patient were reviewed by me and considered in my medical decision making (see chart for  details).     Patient's complaints of pain sound more musculoskeletal than ischemic heart disease.  She was given Robaxin while waiting for her test to resolve.  And even though I think her pain is musculoskeletal she does not hurt when I palpate her chest or abdomen however it does hurt when she moves or changes positions.  12:20 AM patient's troponin is borderline elevated  however reviewing her prior test shows it has been this exact same number since May 2017.  Delta troponin was ordered.  When I discussed with the patient her test results she states her pain is better but not gone.  3:04 AM patient's delta troponin is unchanged which will be negative.  She was discharged home with a muscle relaxer.  Final Clinical Impressions(s) / ED Diagnoses   Final diagnoses:  Muscular chest pain    ED Discharge Orders         Ordered    methocarbamol (ROBAXIN) 500 MG tablet  Every 6 hours PRN     10/01/18 0305          Plan discharge  Rolland Porter, MD, Barbette Or, MD 10/01/18 [phone]

## 2018-09-30 NOTE — ED Triage Notes (Signed)
Pain started in lt arm started yesterday.  Pain is radiating throughout chest since she woke up this morning.

## 2018-09-30 NOTE — ED Notes (Signed)
ekg done in triage

## 2018-10-01 LAB — COMPREHENSIVE METABOLIC PANEL
ALT: 17 U/L (ref 0–44)
AST: 20 U/L (ref 15–41)
Albumin: 3.8 g/dL (ref 3.5–5.0)
Alkaline Phosphatase: 26 U/L — ABNORMAL LOW (ref 38–126)
Anion gap: 7 (ref 5–15)
BUN: 33 mg/dL — ABNORMAL HIGH (ref 8–23)
CO2: 24 mmol/L (ref 22–32)
Calcium: 9.6 mg/dL (ref 8.9–10.3)
Chloride: 110 mmol/L (ref 98–111)
Creatinine, Ser: 2.07 mg/dL — ABNORMAL HIGH (ref 0.44–1.00)
GFR calc Af Amer: 27 mL/min — ABNORMAL LOW (ref >60)
GFR calc non Af Amer: 23 mL/min — ABNORMAL LOW (ref >60)
Glucose, Bld: 104 mg/dL — ABNORMAL HIGH (ref 70–99)
Potassium: 3.3 mmol/L — ABNORMAL LOW (ref 3.5–5.1)
Sodium: 141 mmol/L (ref 135–145)
Total Bilirubin: 0.5 mg/dL (ref 0.3–1.2)
Total Protein: 7.1 g/dL (ref 6.5–8.1)

## 2018-10-01 LAB — LIPASE, BLOOD: Lipase: 22 U/L (ref 11–51)

## 2018-10-01 LAB — TROPONIN I
Troponin I: 0.03 ng/mL (ref <0.03)
Troponin I: 0.03 ng/mL (ref <0.03)

## 2018-10-01 MED ORDER — METHOCARBAMOL 500 MG PO TABS: 500.0000 mg | ORAL_TABLET | Freq: Four times a day (QID) | ORAL | 0 refills | Status: DC | PRN

## 2018-10-01 NOTE — Discharge Instructions (Addendum)
Take the medication for your pain.  Recheck if you get fever, cough, feel like you are struggling to breathe, get vomiting or you seem worse.

## 2018-10-01 NOTE — ED Notes (Signed)
CRITICAL VALUE ALERT  Critical Value:  Troponin 0.03 Date & Time Notied:  10/01/18 @ 0018 Provider Notified:I Knapp Orders Received/Actions taken: None yet

## 2018-10-22 ENCOUNTER — Other Ambulatory Visit: Payer: Self-pay | Admitting: Family Medicine

## 2018-11-06 ENCOUNTER — Telehealth: Payer: Self-pay | Admitting: *Deleted

## 2018-11-06 ENCOUNTER — Other Ambulatory Visit: Payer: Self-pay

## 2018-11-06 MED ORDER — PITAVASTATIN CALCIUM 2 MG PO TABS: ORAL_TABLET | ORAL | 0 refills | Status: DC

## 2018-11-06 NOTE — Telephone Encounter (Signed)
Pt called stating she was out of choleseterol medication livalo. She is completely out and has been out since Friday. Needs this sent to [facility]

## 2018-11-06 NOTE — Telephone Encounter (Signed)
Medication refilled and sent to [facility] 

## 2018-11-08 ENCOUNTER — Other Ambulatory Visit: Payer: Self-pay | Admitting: Family Medicine

## 2018-11-12 ENCOUNTER — Encounter: Payer: Self-pay | Admitting: Family Medicine

## 2018-11-12 ENCOUNTER — Ambulatory Visit (INDEPENDENT_AMBULATORY_CARE_PROVIDER_SITE_OTHER): Payer: PPO | Admitting: Family Medicine

## 2018-11-12 DIAGNOSIS — E6609 Other obesity due to excess calories: Secondary | ICD-10-CM | POA: Diagnosis not present

## 2018-11-12 DIAGNOSIS — N184 Chronic kidney disease, stage 4 (severe): Secondary | ICD-10-CM | POA: Diagnosis not present

## 2018-11-12 DIAGNOSIS — E1122 Type 2 diabetes mellitus with diabetic chronic kidney disease: Secondary | ICD-10-CM | POA: Diagnosis not present

## 2018-11-12 DIAGNOSIS — Z1231 Encounter for screening mammogram for malignant neoplasm of breast: Secondary | ICD-10-CM | POA: Diagnosis not present

## 2018-11-12 DIAGNOSIS — I129 Hypertensive chronic kidney disease with stage 1 through stage 4 chronic kidney disease, or unspecified chronic kidney disease: Secondary | ICD-10-CM | POA: Diagnosis not present

## 2018-11-12 DIAGNOSIS — E785 Hyperlipidemia, unspecified: Secondary | ICD-10-CM | POA: Diagnosis not present

## 2018-11-12 DIAGNOSIS — Z6830 Body mass index (BMI) 30.0-30.9, adult: Secondary | ICD-10-CM | POA: Diagnosis not present

## 2018-11-12 DIAGNOSIS — I5042 Chronic combined systolic (congestive) and diastolic (congestive) heart failure: Secondary | ICD-10-CM

## 2018-11-12 NOTE — Assessment & Plan Note (Signed)
No signs / symptoms of decompensation reported

## 2018-11-12 NOTE — Progress Notes (Signed)
Virtual Visit via Telephone Note  I connected with [name] [name] on 11/12/18 at 10:20 AM EDT by telephone and verified that I am speaking with the correct person using two identifiers. I am in the office , and patient is at her home   I discussed the limitations, risks, security and privacy concerns of performing an evaluation and management service by telephone and the availability of in person appointments. I also discussed with the patient that there may be a patient responsible charge related to this service. The patient expressed understanding and agreed to proceed.   History of Present Illness: Denies recent fever or chills. Denies sinus pressure, nasal congestion, ear pain or sore throat. Denies chest congestion, productive cough or wheezing. Denies chest pains, palpitations and leg swelling Denies abdominal pain, nausea, vomiting,diarrhea or constipation.   Denies dysuria, frequency, hesitancy or incontinence.c/o knee pain and some instability, but not severe enough to warrant surgery, though the thought has been verbalized by her Orthopedic surgeon Denies headaches, seizures, numbness, or tingling. Denies depression, anxiety or insomnia. Denies skin break down or rash. Blood sugar falls occasionally esp when she does not eat , down to 90 at times Denies polyuria, polydipsia, blurred vision  Unable to do stool test , will hold until explained to her once more      Observations/Objective:   Assessment and Plan:   Follow Up Instructions:    I discussed the assessment and treatment plan with the patient. The patient was provided an opportunity to ask questions and all were answered. The patient agreed with the plan and demonstrated an understanding of the instructions.   The patient was advised to call back or seek an in-person evaluation if the symptoms worsen or if the condition fails to improve as anticipated.  I provided 25 minutes of non-face-to-face time during this  encounter.   [name], MD

## 2018-11-12 NOTE — Patient Instructions (Addendum)
Wellnss due June 21 or after, please schedule  MD f/u with labs 3rd week in May  Fasting lipid, cmp and EGFR and hBA1c first week in May   Mammogram to be scheduled in July or shortly after  Goal for fasting blood sugar ranges from 80 to 120 and 2 hours after any meal or at bedtime should be between 130 to 170.  It is important that you exercise regularly at least 30 minutes 5 times a week. If you develop chest pain, have severe difficulty breathing, or feel very tired, stop exercising immediately and seek medical attention    Social distancing. Frequent hand washing with soap and water Keeping your hands off of your face. These 3 practices will help to keep both you and your community healthy during this time. Please practice them faithfully!   Thank you  for choosing [facility]. We consider it a privelige to serve you.  Delivering excellent health care in a caring and  compassionate way is our goal.  Partnering with you,  so that together we can achieve this goal is our strategy.

## 2018-11-12 NOTE — Assessment & Plan Note (Signed)
Hyperlipidemia:Low fat diet discussed and encouraged.   Lipid Panel  Lab Results  Component Value Date   CHOL 164 02/13/2018   HDL 59 02/13/2018   LDLCALC 88 02/13/2018   TRIG 84 02/13/2018   CHOLHDL 2.8 02/13/2018  Updated lab needed at/ before next visit.

## 2018-11-12 NOTE — Assessment & Plan Note (Signed)
/   [name] [name] is reminded of the importance of commitment to daily physical activity for 30 minutes or more, as able and the need to limit carbohydrate intake to 30 to 60 grams per meal to help with blood sugar control.   The need to take medication as prescribed, test blood sugar as directed, and to call between visits if there is a concern that blood sugar is uncontrolled is also discussed.   [name] [name] is reminded of the importance of daily foot exam, annual eye examination, and good blood sugar, blood pressure and cholesterol control. Reports improved control, updated lab needed by mid May  Diabetic Labs Latest Ref Rng & Units 09/30/2018 07/08/2018 06/27/2018 02/13/2018 02/09/2018  HbA1c <5.7 % of total Hgb - - 7.9(H) 7.4(H) -  Microalbumin Not Estab. ug/mL - - - - <3.0(H)  Micro/Creat Ratio 0.0 - 30.0 mg/g creat - - - - <5.9  Chol <200 mg/dL - - - 164 -  HDL >50 mg/dL - - - 59 -  Calc LDL mg/dL (calc) - - - 88 -  Triglycerides <150 mg/dL - - - 84 -  Creatinine 0.44 - 1.00 mg/dL 2.07(H) 1.71(H) 2.28(H) 1.81(H) -   BP/Weight 10/01/2018 09/30/2018 07/18/2018 07/08/2018 06/27/2018 04/19/2018 3/42/8768  Systolic BP 115 - 726 203 559 741 638  Diastolic BP 56 - 79 74 70 68 73  Wt. (Lbs) - 186 177 186 185 187 186  BMI - 30.02 28.57 30.02 29.86 30.18 30.02   Foot/eye exam completion dates Latest Ref Rng & Units 07/31/2017 05/31/2016  Eye Exam No Retinopathy - No Retinopathy  Foot exam Order - - -  Foot Form Completion - Done -

## 2018-11-12 NOTE — Addendum Note (Signed)
Addended by: Eual Fines on: 11/12/2018 01:02 PM   Modules accepted: Orders

## 2018-11-12 NOTE — Assessment & Plan Note (Signed)
Deteriorated.  Patient re-educated about  the importance of commitment to a  minimum of 150 minutes of exercise per week as able.  The importance of healthy food choices with portion control discussed, as well as eating regularly and within a 12 hour window most days. The need to choose "clean , green" food 50 to 75% of the time is discussed, as well as to make water the primary drink and set a goal of 64 ounces water daily.  Encouraged to start a food diary,  and to consider  joining a support group. Sample diet sheets offered. Goals set by the patient for the next several months.   Weight /BMI 09/30/2018 07/18/2018 07/08/2018  WEIGHT 186 lb 177 lb 186 lb  HEIGHT 5\' 6"  5\' 6"  5\' 6"   BMI 30.02 kg/m2 28.57 kg/m2 30.02 kg/m2

## 2018-11-22 ENCOUNTER — Other Ambulatory Visit: Payer: Self-pay | Admitting: Family Medicine

## 2018-11-26 ENCOUNTER — Ambulatory Visit: Payer: PPO | Admitting: Cardiology

## 2018-12-18 ENCOUNTER — Other Ambulatory Visit: Payer: Self-pay | Admitting: Family Medicine

## 2018-12-27 ENCOUNTER — Other Ambulatory Visit: Payer: Self-pay | Admitting: Family Medicine

## 2018-12-31 ENCOUNTER — Ambulatory Visit: Payer: PPO | Admitting: Family Medicine

## 2019-01-03 ENCOUNTER — Ambulatory Visit (INDEPENDENT_AMBULATORY_CARE_PROVIDER_SITE_OTHER): Payer: PPO | Admitting: Family Medicine

## 2019-01-03 ENCOUNTER — Encounter: Payer: Self-pay | Admitting: Family Medicine

## 2019-01-03 VITALS — Ht 66.0 in | Wt 186.0 lb

## 2019-01-03 DIAGNOSIS — E785 Hyperlipidemia, unspecified: Secondary | ICD-10-CM

## 2019-01-03 DIAGNOSIS — E1122 Type 2 diabetes mellitus with diabetic chronic kidney disease: Secondary | ICD-10-CM | POA: Diagnosis not present

## 2019-01-03 DIAGNOSIS — N184 Chronic kidney disease, stage 4 (severe): Secondary | ICD-10-CM | POA: Diagnosis not present

## 2019-01-03 DIAGNOSIS — I129 Hypertensive chronic kidney disease with stage 1 through stage 4 chronic kidney disease, or unspecified chronic kidney disease: Secondary | ICD-10-CM | POA: Diagnosis not present

## 2019-01-03 DIAGNOSIS — I10 Essential (primary) hypertension: Secondary | ICD-10-CM | POA: Diagnosis not present

## 2019-01-03 NOTE — Patient Instructions (Signed)
    Thank you for completing your visit via telephone today.  I appreciate the opportunity to provide you with the care for your health and wellness. Today we discussed: Overall health and wellness  Dr. Moshe Cipro had placed some labs for you to get for this visit but they had not been drawn yet.  That is okay please over the next couple weeks see if you can get these labs drawn so that we can review them with you in 1 month's time.  If everything looks okay we will not need to follow-up with you in 1 month.  But if any of your labs have changed or adjusted or any medication adjustment will be good to follow-up with you in the appointment hopefully this appointment will be in the office.    Please continue to practice eating a low-fat, low-sodium, heart healthy, diabetic diet.  As you can please try to get 30 minutes of exercise on most days of the week.  You can break this up in 10-minute segments 10 minutes before each meal or after each meal can help you get total of 30 minutes daily.  Please continue to use Tylenol and any muscle rubs that you might need for your knees.  Try to stay in good spirits during this time and this type of weather.  We will be thinking and hoping that she would do well.  Check back in with [place] if you need it before your next appointment.  Please continue to practice social distancing during this time to keep you, your family and our community safe.  If you must go out please try to wear a mask.  Also practice good handwashing and avoid touching your face.   [place] YOUR HANDS WELL AND FREQUENTLY. AVOID TOUCHING YOUR FACE, UNLESS YOUR HANDS ARE FRESHLY WASHED.  GET FRESH AIR DAILY. STAY HYDRATED WITH WATER.   It was a pleasure to see you and I look forward to continuing to work together on your health and well-being. Please do not hesitate to call the office if you need care or have questions about your care.  Have a wonderful day and week. With Gratitude, Cherly Beach,  DNP, AGNP-BC

## 2019-01-03 NOTE — Progress Notes (Signed)
Virtual Visit via Telephone Note   This visit type was conducted due to national recommendations for restrictions regarding the COVID-19 Pandemic (e.g. social distancing) in an effort to limit this patient's exposure and mitigate transmission in our community.  Due to her co-morbid illnesses, this patient is at least at moderate risk for complications without adequate follow up.  This format is felt to be most appropriate for this patient at this time.  The patient did not have access to video technology/had technical difficulties with video requiring transitioning to audio format only (telephone).  All issues noted in this document were discussed and addressed.  No physical exam could be performed with this format.    Evaluation Performed:  Follow-up visit  Date:  01/03/2019   ID:  [name] [name], [name] [name] 02-Jan-1946, MRN [id number]  Patient Location: Home Provider Location: Other:  telemedicine   Location of Patient: Home Location of Provider: Telehealth Consent was obtain for visit to be over via telehealth. I verified that I am speaking with the correct person using two identifiers.  PCP:  [name] Helper, MD   Chief Complaint:  Follow up on chronic conditions  History of Present Illness:    [name] [name] is a 73 y.o. female patient of Dr. Griffin [name].  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  Today [name] presents for follow-up on some of her chronic conditions including hypertension, hyperlipidemia and diabetes.  She reports taking all of her medications as directed and without issue.  She denies having any trouble with monitoring her blood sugar but reports that she does not have a blood pressure cuff at home.   Hypertension: Since she does not have a blood pressure cuff at home unsure of the current blood pressure level.  Previous office visits demonstrated control.  She denies having any vision changes, dizziness, headaches, chest pain, palpitations, chest tightness.  Diabetes: She reports that her morning blood sugars are in the 1 teens to the 120s.  Reports occasionally dipping down into the 80s.  She reports that sometimes she gets down into the 80s overnight but she will keep something by her bedside if she starts feeling a little bit off and shaky.  Overall she is doing well she says.  She reports that she is trying to eat better.  But she has had a harder time since being stuck in the house.  Hyperlipidemia: Currently controlled on her last lab check.  Will be checking in the future as she did not go get her labs prior to this appointment.  We will bring her back in 1 month to reassess the above with lab work.  She reports that she does eat good but she eats pretty much what she wants.  Additionally she is not currently doing any form of exercise.  Overall [name] is feeling okay a little bit down secondary to being stuck in the house and the weather being yucky over the last several days and for the next several days.  But she reports that she is doing okay and feeling ready to get out of the house.  The patient does not have symptoms concerning for COVID-19 infection (fever, chills, cough, or new shortness of breath).   Past Medical, Surgical, Social History, Allergies, and Medications have been Reviewed.  Past Medical History:  Diagnosis Date  . Anxiety   . Aortic stenosis   . Arthritis   . Chronic systolic heart failure ([place])   . CKD (chronic kidney disease) stage 3, GFR 30-59 ml/min (H[facility])   . Constipation   . Depression   . Diabetes mellitus, type 2 ([place])   . Essential hypertension, benign   . Hyperlipidemia   . Incidental pulmonary nodule, > 38mm and < 52mm    7 x 4 mm LLL nodule   . LBBB (left bundle branch block)   . Nonischemic cardiomyopathy ([facility])    No significant obstructive CAD at heart catheterization 05/2014, LVEF 20%  . Obesity   . Pneumonia 2013  . 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 Symptomatic PVCs    Past Surgical History:  Procedure Laterality Date  . CARDIAC CATHETERIZATION    . CESAREAN SECTION  1987  . FLEXIBLE BRONCHOSCOPY N/A 01/14/2015   Procedure: FLEXIBLE BRONCHOSCOPY;  Surgeon: [name], MD;  Location: [place];  Service: Thoracic;  Laterality: N/A;  . LEFT AND RIGHT HEART CATHETERIZATION WITH CORONARY ANGIOGRAM N/A 12/05/2011   Procedure: LEFT AND RIGHT HEART CATHETERIZATION WITH CORONARY ANGIOGRAM;  Surgeon: [name], MD;  Location: [facility] CATH LAB;  Service: Cardiovascular;  Laterality: N/A;  . LEFT AND RIGHT HEART CATHETERIZATION WITH CORONARY ANGIOGRAM N/A 05/23/2014   Procedure: LEFT AND RIGHT HEART CATHETERIZATION WITH CORONARY ANGIOGRAM;  Surgeon: [name], MD;  Location: [facility] CATH LAB;  Service: Cardiovascular;  Laterality: N/A;  . MULTIPLE EXTRACTIONS WITH ALVEOLOPLASTY N/A 02/27/2014   Procedure: Extraction of tooth #'s 2,4,5,6,7,8,9,10,11,12, 22, 23, 24, 25, 26, 28 with alveoloplasty and bilateral mandibular tori reductions;  Surgeon: [name], DDS;  Location: [place];  Service: Oral Surgery;  Laterality: N/A;  . RIGID BRONCHOSCOPY N/A 01/14/2015   Procedure: RIGID BRONCHOSCOPY with Removal Of Foreign Body ;  Surgeon: [name], MD;  Location: [place];  Service: Thoracic;  Laterality: N/A;  . TEE WITHOUT CARDIOVERSION N/A 03/24/2015   Procedure: TRANSESOPHAGEAL ECHOCARDIOGRAM (TEE);  Surgeon: [name], MD;  Location: [place];  Service: Open Heart Surgery;  Laterality: N/A;  . TRANSCATHETER AORTIC VALVE REPLACEMENT, TRANSFEMORAL Bilateral 03/24/2015   Procedure:  TRANSCATHETER AORTIC VALVE REPLACEMENT, TRANSFEMORAL;  Surgeon: [name], MD;  Location: [place];  Service: Open Heart Surgery;  Laterality: Bilateral;  . TUBAL LIGATION  1987  . VIDEO BRONCHOSCOPY Bilateral 06/05/2014   Procedure: VIDEO BRONCHOSCOPY WITHOUT FLUORO;  Surgeon: [name], MD;  Location[facility] ENDOSCOPY;  Service: Cardiopulmonary;  Laterality: Bilateral;     Current Meds  Medication Sig  . acetaminophen (TYLENOL) 500 MG tablet Take 1,000 mg by mouth every 6 (six) hours as needed for mild pain or moderate pain.   Marland Kitchen aspirin EC 81 MG tablet Take 81 mg by mouth every morning.   . carvedilol (COREG) 25 MG tablet TAKE 1 TABLET BY MOUTH TWICE DAILY WITH MEALS. (Patient taking differently: Take 25 mg by mouth 2 (two) times daily with a meal. )  . ezetimibe (ZETIA) 10 MG tablet TAKE 1 TABLET BY MOUTH ONCE A DAY.  . fenofibrate (TRICOR) 145 MG tablet TAKE (1) TABLET BY MOUTH ONCE DAILY.  . fluticasone (FLONASE) 50 MCG/ACT nasal spray Place 2 sprays into both nostrils daily. (Patient taking differently: Place 2 sprays into both nostrils daily as needed for allergies or rhinitis. )  .  furosemide (LASIX) 40 MG tablet TAKE 1 TABLET BY MOUTH EVERY MORNING.  . GLIPIZIDE XL 5 MG 24 hr tablet TAKE 1 TABLET BY MOUTH DAILY WITH BREAKFAST. (Patient taking differently: Take 5 mg by mouth daily with breakfast. )  . glucose blood (ONE TOUCH ULTRA TEST) test strip Use as instructed three times daily dx: e11.65  . Lancets (ONETOUCH DELICA PLUS [id number]) MISC USE 3 TIMES A DAY AS DIRECTED.  Marland Kitchen lidocaine-prilocaine (EMLA) cream Apply 1-2 application topically 3 (three) times daily as needed.  Marland Kitchen LITETOUCH PEN NEEDLES 31G X 8 MM MISC USE AS NEEDED TO INJECT INSULIN.  . methocarbamol (ROBAXIN) 500 MG tablet Take 1 tablet (500 mg total) by mouth every 6 (six) hours as needed for muscle spasms (chest pain).  . Multiple Vitamins-Minerals (MULTIVITAMIN PO) Take 1 tablet by mouth every morning.   .  niacin (NIASPAN) 500 MG CR tablet TAKE (1) TABLET BY MOUTH AT BEDTIME. (Patient taking differently: Take 500 mg by mouth at bedtime. )  . NOVOLOG MIX 70/30 FLEXPEN (70-30) 100 UNIT/ML FlexPen INJECT 12 TO 15 UNITS SUBCUTANEOUSLY TWICE DAILY.  Marland Kitchen Pitavastatin Calcium (LIVALO) 2 MG TABS TAKE ONE TABLET BY MOUTH ONCE DAILY AFTER SUPPER.  Marland Kitchen potassium chloride (K-DUR) 10 MEQ tablet Take 1 tablet (10 mEq total) by mouth daily. TAKE ONE TABLET BY MOUTH DAILY. DO NOT TAKE IF NOT TAKING FUROSEMIDE.  Marland Kitchen spironolactone (ALDACTONE) 25 MG tablet TAKE 1 TABLET BY MOUTH ONCE DAILY.     Allergies:   Crestor [rosuvastatin]; Tramadol; and Penicillins   Social History   Tobacco Use  . Smoking status: Never Smoker  . Smokeless tobacco: Never Used  Substance Use Topics  . Alcohol use: No  . Drug use: No     Family Hx: The patient's family history includes Breast cancer in her sister; Cancer in her brother; Colon cancer (age of onset: 63) in her father; Congestive Heart Failure in her daughter and son; Diabetes in her brother and mother; Heart attack (age of onset: 46) in her mother; Heart disease in her mother; High Cholesterol in her son; Hypertension in her brother, mother, and son; Stroke in her brother.  ROS:   Please see the history of present illness.    Review of Systems  Constitutional: Negative for chills and fever.  HENT: Negative for congestion and sinus pain.   Eyes: Negative for photophobia.  Respiratory: Negative for cough, shortness of breath and wheezing.   Cardiovascular: Negative for chest pain and leg swelling.  Gastrointestinal: Negative for constipation and diarrhea.  Genitourinary: Negative.   Musculoskeletal: Positive for joint pain.       Knees bother her-arthritis   Skin: Negative.   Neurological: Negative for dizziness and headaches.  Endo/Heme/Allergies: Negative.   Psychiatric/Behavioral: Negative.   All other systems reviewed and are negative.   Labs/Other Tests and  Data Reviewed:     Recent Labs: 02/13/2018: TSH 1.95 09/30/2018: ALT 17; BUN 33; Creatinine, Ser 2.07; Hemoglobin 11.2; Platelets 181; Potassium 3.3; Sodium 141   Recent Lipid Panel Lab Results  Component Value Date/Time   CHOL 164 02/13/2018 09:44 AM   TRIG 84 02/13/2018 09:44 AM   HDL 59 02/13/2018 09:44 AM   CHOLHDL 2.8 02/13/2018 09:44 AM   LDLCALC 88 02/13/2018 09:44 AM    Wt Readings from Last 3 Encounters:  01/03/19 186 lb (84.4 kg)  09/30/18 186 lb (84.4 kg)  07/18/18 177 lb (80.3 kg)     Objective:    Vital Signs:  Ht  5\' 6"  (1.676 m)   Wt 186 lb (84.4 kg)   BMI 30.02 kg/m    GEN:  alert and oriented  RESPIRATORY:  no shortness breath during conversation PSYCH:  normal affect, mood, and good communication   ASSESSMENT & PLAN:    1. 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  Last A1c had increased from 7.4 to 7.9%.  She was advised to improve her blood sugar control and avoid nephrotoxic drugs secondary to having stage IV kidney disease along with blood pressure issues.  Today she reports good numbers for good blood sugar control at home.  But did not get her labs so that we are unable to truly assess this.  Advised that she gets her labs drawn in the next couple weeks so that we can review them with her in 1 month.  2. Hyperlipidemia LDL goal <100 Previously controlled but will not change her medications at this time.  Encouraged to maintain a low-fat diet.  Encouraged to try to walk and move around as she can.  We will assess this with labs.  3. Essential hypertension, benign Previously controlled blood pressures.  Will not change current medication regime.  Emphasized DASH diet and commitment to daily physical activity for a minimum of 30 minutes discussed and encouraged.    Time:   Today, I have spent 10 minutes with the patient with telehealth technology discussing the above problems.      Medication Adjustments/Labs and Tests Ordered: Current medicines are reviewed at length with the patient today.  Concerns regarding medicines are outlined above.   Tests Ordered: No orders of the defined types were placed in this encounter.   Medication Changes: No orders of the defined types were placed in this encounter.   Disposition:  Follow up in 1 month(s)  Signed, Perlie Mayo, NP  01/03/2019 2:10 PM     [facility]

## 2019-01-09 ENCOUNTER — Telehealth: Payer: Self-pay | Admitting: Cardiology

## 2019-01-09 NOTE — Progress Notes (Signed)
Virtual Visit via Telephone Note   This visit type was conducted due to national recommendations for restrictions regarding the COVID-19 Pandemic (e.g. social distancing) in an effort to limit this patient's exposure and mitigate transmission in our community.  Due to her co-morbid illnesses, this patient is at least at moderate risk for complications without adequate follow up.  This format is felt to be most appropriate for this patient at this time.  The patient did not have access to video technology/had technical difficulties with video requiring transitioning to audio format only (telephone).  All issues noted in this document were discussed and addressed.  No physical exam could be performed with this format.  Please refer to the patient's chart for her  consent to telehealth for [facility].   Date:  01/10/2019   ID:  [name] [name], DOB Nov 23, 1945, MRN [id number]  Patient Location: Home Provider Location: Office  PCP:  Fayrene Helper, MD  Cardiologist: [name], MD Electrophysiologist:  None   Evaluation Performed:  Follow-Up Visit  Chief Complaint:   Cardiac follow-up  History of Present Illness:    [name] [name] is a 73 y.o. female last seen in September 2019.  She did not have video access and we spoke by phone today.  She tells me that she has been doing well overall.  She has not gotten out of the house very much, wears a mask and uses gloves when she goes to the grocery store.  She does not report any increasing shortness of breath, palpitations, or chest pain.  Follow-up echocardiogram from September 2019 is outlined below.  I went over her medications which are outlined below, she reports no intolerances.  She does not report any orthopnea or PND, no increasing leg swelling.  No palpitations or syncope.  The patient does not have symptoms concerning for COVID-19 infection (fever, chills, cough, or new shortness of breath).    Past Medical History:   Diagnosis Date  . Anxiety   . Aortic stenosis   . Arthritis   . Chronic systolic heart failure ([place])   . CKD (chronic kidney disease) stage 3, GFR 30-59 ml/min (HCC)   . Constipation   . Depression   . Diabetes mellitus, type 2 ([place])   . Essential hypertension, benign   . Hyperlipidemia   . Incidental pulmonary nodule, > 47mm and < 94mm    7 x 4 mm LLL nodule  . LBBB (left bundle branch block)   . Nonischemic cardiomyopathy (HCC)    No significant obstructive CAD at heart catheterization 05/2014, LVEF 20%  . Obesity   . Pneumonia 2013  . 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 Symptomatic PVCs    Past Surgical History:  Procedure Laterality Date  . CARDIAC CATHETERIZATION    . CESAREAN SECTION  1987  . FLEXIBLE BRONCHOSCOPY N/A 01/14/2015   Procedure: FLEXIBLE BRONCHOSCOPY;  Surgeon: [name], MD;  Location: [place];  Service: Thoracic;  Laterality: N/A;  . LEFT AND RIGHT HEART CATHETERIZATION WITH CORONARY ANGIOGRAM N/A 12/05/2011   Procedure: LEFT AND RIGHT HEART CATHETERIZATION WITH CORONARY ANGIOGRAM;  Surgeon: [name], MD;  Location[facility] CATH LAB;  Service: Cardiovascular;  Laterality: N/A;  . LEFT AND RIGHT HEART CATHETERIZATION WITH CORONARY ANGIOGRAM N/A 05/23/2014   Procedure: LEFT AND RIGHT HEART CATHETERIZATION WITH CORONARY ANGIOGRAM;  Surgeon: [name], MD;  Location: [place] CATH LAB;  Service: Cardiovascular;  Laterality: N/A;  . MULTIPLE EXTRACTIONS WITH ALVEOLOPLASTY N/A 02/27/2014   Procedure: Extraction of tooth #'s 2,4,5,6,7,8,9,10,11,12, 22, 23, 24, 25, 26, 28 with alveoloplasty and bilateral mandibular tori reductions;  Surgeon: [name], DDS;  Location: [place];  Service: Oral Surgery;  Laterality: N/A;  . RIGID BRONCHOSCOPY N/A 01/14/2015   Procedure: RIGID BRONCHOSCOPY with Removal Of Foreign Body ;  Surgeon:  [name], MD;  Location: [place];  Service: Thoracic;  Laterality: N/A;  . TEE WITHOUT CARDIOVERSION N/A 03/24/2015   Procedure: TRANSESOPHAGEAL ECHOCARDIOGRAM (TEE);  Surgeon: [name], MD;  Location: [place];  Service: Open Heart Surgery;  Laterality: N/A;  . TRANSCATHETER AORTIC VALVE REPLACEMENT, TRANSFEMORAL Bilateral 03/24/2015   Procedure: TRANSCATHETER AORTIC VALVE REPLACEMENT, TRANSFEMORAL;  Surgeon: [name], MD;  Location: [place];  Service: Open Heart Surgery;  Laterality: Bilateral;  . TUBAL LIGATION  1987  . VIDEO BRONCHOSCOPY Bilateral 06/05/2014   Procedure: VIDEO BRONCHOSCOPY WITHOUT FLUORO;  Surgeon: [name], MD;  Location: [facility] ENDOSCOPY;  Service: Cardiopulmonary;  Laterality: Bilateral;     Current Meds  Medication Sig  . acetaminophen (TYLENOL) 500 MG tablet Take 1,000 mg by mouth every 6 (six) hours as needed for mild pain or moderate pain.   Marland Kitchen aspirin EC 81 MG tablet Take 81 mg by mouth every morning.   . carvedilol (COREG) 25 MG tablet TAKE 1 TABLET BY MOUTH TWICE DAILY WITH MEALS. (Patient taking differently: Take 25 mg by mouth 2 (two) times daily with a meal. )  . ezetimibe (ZETIA) 10 MG tablet TAKE 1 TABLET BY MOUTH ONCE A DAY.  . fenofibrate (TRICOR) 145 MG tablet TAKE (1) TABLET BY MOUTH ONCE DAILY.  . fluticasone (FLONASE) 50 MCG/ACT nasal spray Place 2 sprays into both nostrils daily. (Patient taking differently: Place 2 sprays into both nostrils daily as needed for allergies or rhinitis. )  . furosemide (LASIX) 40 MG tablet TAKE 1 TABLET BY MOUTH EVERY MORNING.  . GLIPIZIDE XL 5 MG 24 hr tablet TAKE 1 TABLET BY MOUTH DAILY WITH BREAKFAST. (Patient taking differently: Take 5 mg by mouth daily with breakfast. )  . glucose blood (ONE TOUCH ULTRA TEST) test strip Use as instructed three times daily dx: e11.65  . Lancets (ONETOUCH DELICA PLUS [id number]) MISC USE 3 TIMES A DAY AS DIRECTED.  Marland Kitchen lidocaine-prilocaine (EMLA) cream  Apply 1-2 application topically 3 (three) times daily as needed.  Marland Kitchen LITETOUCH PEN NEEDLES 31G X 8 MM MISC USE AS NEEDED TO INJECT INSULIN.  . methocarbamol (ROBAXIN) 500 MG tablet Take 1 tablet (500 mg total) by mouth every 6 (six) hours as needed for muscle spasms (chest pain).  . Multiple Vitamins-Minerals (MULTIVITAMIN PO) Take 1 tablet by mouth every morning.   . niacin (NIASPAN) 500 MG CR tablet TAKE (1) TABLET BY MOUTH AT BEDTIME. (Patient taking differently: Take 500 mg by mouth at bedtime. )  . NOVOLOG MIX 70/30 FLEXPEN (70-30) 100 UNIT/ML FlexPen INJECT 12 TO 15 UNITS SUBCUTANEOUSLY TWICE DAILY.  Marland Kitchen potassium chloride (K-DUR) 10 MEQ tablet Take 1 tablet (10 mEq total) by mouth daily. TAKE ONE TABLET BY MOUTH DAILY. DO NOT TAKE IF NOT TAKING FUROSEMIDE.  Marland Kitchen spironolactone (ALDACTONE) 25 MG tablet TAKE 1 TABLET BY MOUTH ONCE DAILY.     Allergies:   Crestor [rosuvastatin]; Tramadol; and Penicillins   Social History   Tobacco Use  . Smoking status: Never Smoker  . Smokeless tobacco: Never  Used  Substance Use Topics  . Alcohol use: No  . Drug use: No     Family Hx: The patient's family history includes Breast cancer in her sister; Cancer in her brother; Colon cancer (age of onset: 34) in her father; Congestive Heart Failure in her daughter and son; Diabetes in her brother and mother; Heart attack (age of onset: 72) in her mother; Heart disease in her mother; High Cholesterol in her son; Hypertension in her brother, mother, and son; Stroke in her brother.  ROS:   Please see the history of present illness.    All other systems reviewed and are negative.   Prior CV studies:   The following studies were reviewed today:  Echocardiogram 04/25/2018: Study Conclusions  - Left ventricle: The cavity size was mildly dilated. Wall   thickness was increased in a pattern of moderate LVH. Systolic   function was severely reduced. The estimated ejection fraction   was in the range of 15% to  20%. Diffuse hypokinesis. Doppler   parameters are consistent with abnormal left ventricular   relaxation (grade 1 diastolic dysfunction). Doppler parameters   are consistent with high ventricular filling pressure. - Aortic valve: 23 mm Edwards Sapien 3 transcatheter heart valve is   in the AV position. There was no stenosis. There was no   significant regurgitation. Mean gradient (S): 9 mm Hg. - Mitral valve: Mildly to moderately calcified annulus. Mildly   thickened leaflets . There was mild regurgitation. - Left atrium: The atrium was mildly dilated. - Pulmonary arteries: Systolic pressure was mildly increased. PA   peak pressure: 31 mm Hg (S). - Technically adequate study.  Labs/Other Tests and Data Reviewed:    EKG:  An ECG dated 10/01/2018 was personally reviewed today and demonstrated:  Sinus rhythm with left bundle branch block.  Recent Labs: 02/13/2018: TSH 1.95 09/30/2018: ALT 17; BUN 33; Creatinine, Ser 2.07; Hemoglobin 11.2; Platelets 181; Potassium 3.3; Sodium 141   Recent Lipid Panel Lab Results  Component Value Date/Time   CHOL 164 02/13/2018 09:44 AM   TRIG 84 02/13/2018 09:44 AM   HDL 59 02/13/2018 09:44 AM   CHOLHDL 2.8 02/13/2018 09:44 AM   LDLCALC 88 02/13/2018 09:44 AM    Wt Readings from Last 3 Encounters:  01/10/19 186 lb (84.4 kg)  01/03/19 186 lb (84.4 kg)  09/30/18 186 lb (84.4 kg)     Objective:    Vital Signs:  Ht 5\' 6"  (1.676 m)   Wt 186 lb (84.4 kg)   BMI 30.02 kg/m    Patient spoke in full sentences on the phone, no shortness of breath. No audible wheezing or cough.  ASSESSMENT & PLAN:    1.  Aortic stenosis status post TAVR back in August 2016.  She remains stable with normal prosthetic function by echocardiogram in September 2019.  2.  Nonischemic cardiomyopathy with LVEF approximately 20% and stable over time.  She has declined ICD, but remains remarkably stable in terms of symptoms.  Continue with medical therapy.  3.  CKD stage  III, last creatinine up to 2.07.  We do not have her on ARB, ACE inhibitor, or ANRI.  4.  Mixed hyperlipidemia, remains on Zetia and Livalo.  COVID-19 Education: The signs and symptoms of COVID-19 were discussed with the patient and how to seek care for testing (follow up with PCP or arrange E-visit).  The importance of social distancing was discussed today.  Time:   Today, I have spent 6 minutes with the  patient with telehealth technology discussing the above problems.     Medication Adjustments/Labs and Tests Ordered: Current medicines are reviewed at length with the patient today.  Concerns regarding medicines are outlined above.   Tests Ordered: No orders of the defined types were placed in this encounter.   Medication Changes: No orders of the defined types were placed in this encounter.   Disposition:  Follow up 6 months in the [place] office.  Signed, Rozann Lesches, MD  01/10/2019 3:41 PM    [place]

## 2019-01-09 NOTE — Telephone Encounter (Signed)
Virtual Visit Pre-Appointment Phone Call  "(Name), I am calling you today to discuss your upcoming appointment. We are currently trying to limit exposure to the virus that causes COVID-19 by seeing patients at home rather than in the office."  1. "What is the BEST phone number to call the day of the visit?" - include this in appointment notes  2. Do you have or have access to (through a family member/friend) a smartphone with video capability that we can use for your visit?" a. If yes - list this number in appt notes as cell (if different from BEST phone #) and list the appointment type as a VIDEO visit in appointment notes b. If no - list the appointment type as a PHONE visit in appointment notes  3. Confirm consent - "In the setting of the current Covid19 crisis, you are scheduled for a (phone or video) visit with your provider on (date) at (time).  Just as we do with many in-office visits, in order for you to participate in this visit, we must obtain consent.  If you'd like, I can send this to your mychart (if signed up) or email for you to review.  Otherwise, I can obtain your verbal consent now.  All virtual visits are billed to your insurance company just like a normal visit would be.  By agreeing to a virtual visit, we'd like you to understand that the technology does not allow for your provider to perform an examination, and thus may limit your provider's ability to fully assess your condition. If your provider identifies any concerns that need to be evaluated in person, we will make arrangements to do so.  Finally, though the technology is pretty good, we cannot assure that it will always work on either your or our end, and in the setting of a video visit, we may have to convert it to a phone-only visit.  In either situation, we cannot ensure that we have a secure connection.  Are you willing to proceed?" STAFF: Did the patient verbally acknowledge consent to telehealth visit? Document  YES/NO here: Yes  4. Advise patient to be prepared - "Two hours prior to your appointment, go ahead and check your blood pressure, pulse, oxygen saturation, and your weight (if you have the equipment to check those) and write them all down. When your visit starts, your provider will ask you for this information. If you have an Apple Watch or Kardia device, please plan to have heart rate information ready on the day of your appointment. Please have a pen and paper handy nearby the day of the visit as well."  5. Give patient instructions for MyChart download to smartphone OR [email or web address] as below if video visit (depending on what platform provider is using)  6. Inform patient they will receive a phone call 15 minutes prior to their appointment time (may be from unknown caller ID) so they should be prepared to answer    TELEPHONE CALL NOTE  [name] [name] has been deemed a candidate for a follow-up tele-health visit to limit community exposure during the Covid-19 pandemic. I spoke with the patient via phone to ensure availability of phone/video source, confirm preferred email & phone number, and discuss instructions and expectations.  I reminded [name] [name] to be prepared with any vital sign and/or heart rhythm information that could potentially be obtained via home monitoring, at the time of her visit. I reminded [name] [name] to expect a phone call prior to  her visit.  [name] 01/09/2019 12:50 PM

## 2019-01-10 ENCOUNTER — Telehealth (INDEPENDENT_AMBULATORY_CARE_PROVIDER_SITE_OTHER): Payer: PPO | Admitting: Cardiology

## 2019-01-10 ENCOUNTER — Encounter: Payer: Self-pay | Admitting: Cardiology

## 2019-01-10 ENCOUNTER — Other Ambulatory Visit: Payer: Self-pay

## 2019-01-10 VITALS — Ht 66.0 in | Wt 186.0 lb

## 2019-01-10 DIAGNOSIS — Z952 Presence of prosthetic heart valve: Secondary | ICD-10-CM

## 2019-01-10 DIAGNOSIS — Z7189 Other specified counseling: Secondary | ICD-10-CM

## 2019-01-10 DIAGNOSIS — I428 Other cardiomyopathies: Secondary | ICD-10-CM | POA: Diagnosis not present

## 2019-01-10 DIAGNOSIS — E782 Mixed hyperlipidemia: Secondary | ICD-10-CM

## 2019-01-10 DIAGNOSIS — N183 Chronic kidney disease, stage 3 unspecified: Secondary | ICD-10-CM

## 2019-01-10 NOTE — Patient Instructions (Signed)
Medication Instructions: Your physician recommends that you continue on your current medications as directed. Please refer to the Current Medication list given to you today.   Labwork: None  Procedures/Testing: None  Follow-Up: 6 months with Dr.McDowell  Any Additional Special Instructions Will Be Listed Below (If Applicable).     If you need a refill on your cardiac medications before your next appointment, please call your pharmacy.      Thank you for choosing [facility] !        

## 2019-01-22 ENCOUNTER — Other Ambulatory Visit: Payer: Self-pay | Admitting: Family Medicine

## 2019-01-30 ENCOUNTER — Ambulatory Visit (INDEPENDENT_AMBULATORY_CARE_PROVIDER_SITE_OTHER): Payer: PPO | Admitting: Family Medicine

## 2019-01-30 ENCOUNTER — Encounter: Payer: Self-pay | Admitting: Family Medicine

## 2019-01-30 ENCOUNTER — Other Ambulatory Visit: Payer: Self-pay

## 2019-01-30 VITALS — Ht 66.0 in | Wt 186.0 lb

## 2019-01-30 DIAGNOSIS — Z6830 Body mass index (BMI) 30.0-30.9, adult: Secondary | ICD-10-CM

## 2019-01-30 DIAGNOSIS — N184 Chronic kidney disease, stage 4 (severe): Secondary | ICD-10-CM | POA: Diagnosis not present

## 2019-01-30 DIAGNOSIS — E1122 Type 2 diabetes mellitus with diabetic chronic kidney disease: Secondary | ICD-10-CM

## 2019-01-30 DIAGNOSIS — I10 Essential (primary) hypertension: Secondary | ICD-10-CM | POA: Diagnosis not present

## 2019-01-30 DIAGNOSIS — I129 Hypertensive chronic kidney disease with stage 1 through stage 4 chronic kidney disease, or unspecified chronic kidney disease: Secondary | ICD-10-CM | POA: Diagnosis not present

## 2019-01-30 DIAGNOSIS — E6609 Other obesity due to excess calories: Secondary | ICD-10-CM | POA: Diagnosis not present

## 2019-01-30 NOTE — Patient Instructions (Addendum)
    Follow up: 3 months in office  PLEASE GET [place].  You can get them any day, please be fasting.  Continue to work in the yard as you can. Continue to eat a well balanced diet and drink plenty of water.  [place] YOUR HANDS WELL AND FREQUENTLY. AVOID TOUCHING YOUR FACE, UNLESS YOUR HANDS ARE FRESHLY WASHED.  GET FRESH AIR DAILY. STAY HYDRATED WITH WATER.   It was a pleasure to see you and I look forward to continuing to work together on your health and well-being. Please do not hesitate to call the office if you need care or have questions about your care.  Have a wonderful day and week. With Gratitude, Cherly Beach, DNP, AGNP-BC

## 2019-01-30 NOTE — Progress Notes (Signed)
Virtual Visit via Telephone Note   This visit type was conducted due to national recommendations for restrictions regarding the COVID-19 Pandemic (e.g. social distancing) in an effort to limit this patient's exposure and mitigate transmission in our community.  Due to her co-morbid illnesses, this patient is at least at moderate risk for complications without adequate follow up.  This format is felt to be most appropriate for this patient at this time.  The patient did not have access to video technology/had technical difficulties with video requiring transitioning to audio format only (telephone).  All issues noted in this document were discussed and addressed.  No physical exam could be performed with this format.   Evaluation Performed:  Follow-up visit  Date:  01/30/2019   ID:  [name] [name], [name] [name] [birth year], MRN [id number]  Patient Location: Home Provider Location: Other:  telemedicine  Location of Patient: Home Location of Provider: Telehealth Consent was obtain for visit to be over via telehealth. I verified that I am speaking with the correct person using two identifiers.  PCP:  Fayrene Helper, MD   Chief Complaint:  Chronic conditions   History of Present Illness:    [name] [name] is a 73 y.o. female patient of Dr. Griffin Dakin.  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  Today [name] [name] presents for follow-up on some of her chronic conditions including hypertension, obesity and diabetes.  She reports taking all of her medications as directed and without issue.  She denies having any trouble with monitoring her blood sugar but reports that she does not have a blood pressure cuff at home.  Hypertension: Since she does not  have a blood pressure cuff at home unsure of the current blood pressure level.  Previous office visits demonstrated control.  She denies having any vision changes, dizziness, headaches, chest pain, palpitations, chest tightness.  Diabetes: She reports that her morning blood sugars are in the 110's to the 120s.  Overall she is doing well she says.  She reports that she is trying to eat better.  But she has had a harder time since being stuck in the house.  Overall Ms. Begeman is feeling okay a little bit down secondary to being stuck in the house and the weather being yucky over the last several days and for the next several days.  But she reports that she is doing okay and feeling ready to get out of the house.  The patient does not have symptoms concerning for COVID-19 infection (fever, chills, cough, or new shortness of breath).   Past Medical, Surgical, Social History, Allergies, and Medications have been Reviewed.   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   23 mm Edwards Sapien 3 transcatheter heart valve placed via open right transfemoral approach   Symptomatic PVCs    Past Surgical History:  Procedure Laterality Date   [place] N/A 01/14/2015   Procedure: FLEXIBLE BRONCHOSCOPY;  Surgeon: [name], MD;  Location: [place];   Service: Thoracic;  Laterality: N/A;   LEFT AND RIGHT HEART CATHETERIZATION WITH CORONARY ANGIOGRAM N/A 12/05/2011   Procedure: LEFT AND RIGHT HEART CATHETERIZATION WITH CORONARY ANGIOGRAM;  Surgeon: [name], MD;  Location: [facility] CATH LAB;  Service: Cardiovascular;  Laterality: N/A;   LEFT AND RIGHT HEART CATHETERIZATION WITH CORONARY ANGIOGRAM N/A 05/23/2014   Procedure: LEFT AND RIGHT HEART CATHETERIZATION WITH CORONARY ANGIOGRAM;  Surgeon: [name], MD;  [facility];  Service: Cardiovascular;  Laterality: N/A;   MULTIPLE EXTRACTIONS WITH ALVEOLOPLASTY N/A 02/27/2014   Procedure: Extraction of tooth #'s 2,4,5,6,7,8,9,10,11,12, 22, 23, 24, 25, 26, 28 with alveoloplasty and bilateral mandibular tori reductions;  Surgeon: [name], DDS;  Location: [place];  Service: Oral Surgery;  Laterality: N/A;   RIGID BRONCHOSCOPY N/A 01/14/2015   Procedure: RIGID BRONCHOSCOPY with Removal Of Foreign Body ;  Surgeon: [name], MD;  Location: [place];  Service: Thoracic;  Laterality: N/A;   TEE WITHOUT CARDIOVERSION N/A 03/24/2015   Procedure: TRANSESOPHAGEAL ECHOCARDIOGRAM (TEE);  Surgeon: [name], MD;  Location: [place];  Service: Open Heart Surgery;  Laterality: N/A;   TRANSCATHETER AORTIC VALVE REPLACEMENT, TRANSFEMORAL Bilateral 03/24/2015   Procedure: TRANSCATHETER AORTIC VALVE REPLACEMENT, TRANSFEMORAL;  Surgeon: [name], MD;  Location: [place];  Service: Open Heart Surgery;  Laterality: Bilateral;   TUBAL LIGATION  1987   VIDEO BRONCHOSCOPY Bilateral 06/05/2014   Procedure: VIDEO BRONCHOSCOPY WITHOUT FLUORO;  Surgeon: [name], MD;  Location: [place];  Service: Cardiopulmonary;  Laterality: Bilateral;     Current Meds  Medication Sig   acetaminophen (TYLENOL) 500 MG tablet Take 1,000 mg by mouth every 6 (six) hours as needed for mild pain or moderate pain.    aspirin EC 81 MG tablet Take 81 mg by mouth every  morning.    carvedilol (COREG) 25 MG tablet TAKE 1 TABLET BY MOUTH TWICE DAILY WITH MEALS. (Patient taking differently: Take 25 mg by mouth 2 (two) times daily with a meal. )   ezetimibe (ZETIA) 10 MG tablet TAKE 1 TABLET BY MOUTH ONCE A DAY.   fenofibrate (TRICOR) 145 MG tablet TAKE (1) TABLET BY MOUTH ONCE DAILY.   fluticasone (FLONASE) 50 MCG/ACT nasal spray Place 2 sprays into both nostrils daily. (Patient taking differently: Place 2 sprays into both nostrils daily as needed for allergies or rhinitis. )   furosemide (LASIX) 40 MG tablet TAKE 1 TABLET BY MOUTH EVERY MORNING.   GLIPIZIDE XL 5 MG 24 hr tablet TAKE 1 TABLET BY MOUTH DAILY WITH BREAKFAST.   glucose blood (ONE TOUCH ULTRA TEST) test strip Use as instructed three times daily dx: e11.65   Lancets (ONETOUCH DELICA PLUS [id number]) MISC USE 3 TIMES A DAY AS DIRECTED.   lidocaine-prilocaine (EMLA) cream Apply 1-2 application topically 3 (three) times daily as needed.   LITETOUCH PEN NEEDLES 31G X 8 MM MISC USE AS NEEDED TO INJECT INSULIN.   methocarbamol (ROBAXIN) 500 MG tablet Take 1 tablet (500 mg total) by mouth every 6 (  six) hours as needed for muscle spasms (chest pain).   Multiple Vitamins-Minerals (MULTIVITAMIN PO) Take 1 tablet by mouth every morning.    niacin (NIASPAN) 500 MG CR tablet TAKE (1) TABLET BY MOUTH AT BEDTIME. (Patient taking differently: Take 500 mg by mouth at bedtime. )   NOVOLOG MIX 70/30 FLEXPEN (70-30) 100 UNIT/ML FlexPen INJECT 12 TO 15 UNITS SUBCUTANEOUSLY TWICE DAILY.   Pitavastatin Calcium (LIVALO) 2 MG TABS TAKE ONE TABLET BY MOUTH ONCE DAILY AFTER SUPPER.   potassium chloride (K-DUR) 10 MEQ tablet Take 1 tablet (10 mEq total) by mouth daily. TAKE ONE TABLET BY MOUTH DAILY. DO NOT TAKE IF NOT TAKING FUROSEMIDE.   spironolactone (ALDACTONE) 25 MG tablet TAKE 1 TABLET BY MOUTH ONCE DAILY.     Allergies:   Crestor [rosuvastatin], Tramadol, and Penicillins   Social History   Tobacco Use     Smoking status: Never Smoker   Smokeless tobacco: Never Used  Substance Use Topics   Alcohol use: No   Drug use: No     Family Hx: The patient's family history includes Breast cancer in her sister; Cancer in her brother; Colon cancer (age of onset: 67) in her father; Congestive Heart Failure in her daughter and son; Diabetes in her brother and mother; Heart attack (age of onset: 78) in her mother; Heart disease in her mother; High Cholesterol in her son; Hypertension in her brother, mother, and son; Stroke in her brother.  ROS:   Please see the history of present illness.    Review of Systems  All other systems reviewed and are negative.   Labs/Other Tests and Data Reviewed:     Recent Labs: 02/13/2018: TSH 1.95 09/30/2018: ALT 17; BUN 33; Creatinine, Ser 2.07; Hemoglobin 11.2; Platelets 181; Potassium 3.3; Sodium 141   Recent Lipid Panel Lab Results  Component Value Date/Time   CHOL 164 02/13/2018 09:44 AM   TRIG 84 02/13/2018 09:44 AM   HDL 59 02/13/2018 09:44 AM   CHOLHDL 2.8 02/13/2018 09:44 AM   LDLCALC 88 02/13/2018 09:44 AM    Wt Readings from Last 3 Encounters:  01/30/19 186 lb (84.4 kg)  01/10/19 186 lb (84.4 kg)  01/03/19 186 lb (84.4 kg)     Objective:    Vital Signs:  Ht 5\' 6"  (1.676 m)    Wt 186 lb (84.4 kg)    BMI 30.02 kg/m    VITAL SIGNS:  reviewed GEN:  alert and oriented RESPIRATORY:  no shortness of breath noted in conversation PSYCH:  normal affect and mood, and good communication  ASSESSMENT & PLAN:    1. 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  Last A1c had increased from 7.4 to 7.9%.  She was advised to improve her blood sugar control and avoid nephrotoxic drugs secondary to having stage IV kidney disease along with blood pressure issues.  Today she reports good numbers for good blood sugar control at home.  But did not get her labs so that we are unable to truly  assess this.  Advised that she gets her labs drawn in the next couple weeks so that we can review them with her in 1 month.  - Hemoglobin A1c - COMPLETE METABOLIC PANEL WITH GFR - CBC with Differential/Platelet - Microalbumin / creatinine urine ratio  2. Class 1 obesity due to excess calories with serious comorbidity and body mass index (BMI) of 30.0 to 30.9  in adult Encourage her to stay active in her yard. Encourage her to eat a well balanced diet and avoid extra sugar.   - COMPLETE METABOLIC PANEL WITH GFR - CBC with Differential/Platelet - Lipid panel  3. Essential hypertension, benign Previously controlled blood pressures.  Will not change current medication regime.  Emphasized DASH diet and commitment to daily physical activity for a minimum of 30 minutes discussed and encouraged  - COMPLETE METABOLIC PANEL WITH GFR - CBC with Differential/Platelet    Time:   Today, I have spent 10 minutes with the patient with telehealth technology discussing the above problems.     Medication Adjustments/Labs and Tests Ordered: Current medicines are reviewed at length with the patient today.  Concerns regarding medicines are outlined above.   Tests Ordered: Orders Placed This Encounter  Procedures   Hemoglobin A1c   COMPLETE METABOLIC PANEL WITH GFR   CBC with Differential/Platelet   Lipid panel   Microalbumin / creatinine urine ratio    Medication Changes: No orders of the defined types were placed in this encounter.   Disposition:  Follow up 3 months   Signed, Perlie Mayo, NP  01/30/2019 2:45 PM     [facility]

## 2019-02-06 ENCOUNTER — Other Ambulatory Visit: Payer: Self-pay

## 2019-02-06 ENCOUNTER — Encounter: Payer: Self-pay | Admitting: Family Medicine

## 2019-02-06 ENCOUNTER — Ambulatory Visit (INDEPENDENT_AMBULATORY_CARE_PROVIDER_SITE_OTHER): Payer: PPO | Admitting: Family Medicine

## 2019-02-06 VITALS — BP 120/70 | HR 82 | Resp 12 | Ht 66.0 in | Wt 186.0 lb

## 2019-02-06 DIAGNOSIS — Z0000 Encounter for general adult medical examination without abnormal findings: Secondary | ICD-10-CM

## 2019-02-06 NOTE — Patient Instructions (Signed)
[name] [name] , Thank you for taking time to come for your Medicare Wellness Visit. I appreciate your ongoing commitment to your health goals. Please review the following plan we discussed and let me know if I can assist you in the future.   Screening recommendations/referrals: Colonoscopy: Get set up once you can-working with GI MD already Mammogram: Needs to set it up post COVID Bone Density: Needs ordered post [place] Recommended yearly ophthalmology/optometry visit for glaucoma screening and checkup Recommended yearly dental visit for hygiene and checkup  Vaccinations: Influenza vaccine: Due Fall 2020 Pneumococcal vaccine: Completed Tdap vaccine: Up to date until 2022 Shingles vaccine: Check insurance for coverage.   Advanced directives: Mailed to you in this packet. If you have questions just ask, we are happy to help.  Conditions/risks identified: FALLS are always a risk.   Next appointment: 05/02/2019    Preventive Care 65 Years and Older, Female Preventive care refers to lifestyle choices and visits with your health care provider that can promote health and wellness. What does preventive care include?  A yearly physical exam. This is also called an annual well check.  Dental exams once or twice a year.  Routine eye exams. Ask your health care provider how often you should have your eyes checked.  Personal lifestyle choices, including:  Daily care of your teeth and gums.  Regular physical activity.  Eating a healthy diet.  Avoiding tobacco and drug use.  Limiting alcohol use.  Practicing safe sex.  Taking low-dose aspirin every day.  Taking vitamin and mineral supplements as recommended by your health care provider. What happens during an annual well check? The services and screenings done by your health care provider during your annual well check will depend on your age, overall health, lifestyle risk factors, and family history of disease. Counseling  Your  health care provider may ask you questions about your:  Alcohol use.  Tobacco use.  Drug use.  Emotional well-being.  Home and relationship well-being.  Sexual activity.  Eating habits.  History of falls.  Memory and ability to understand (cognition).  Work and work Statistician.  Reproductive health. Screening  You may have the following tests or measurements:  Height, weight, and BMI.  Blood pressure.  Lipid and cholesterol levels. These may be checked every 5 years, or more frequently if you are over 10 years old.  Skin check.  Lung cancer screening. You may have this screening every year starting at age 21 if you have a 30-pack-year history of smoking and currently smoke or have quit within the past 15 years.  Fecal occult blood test (FOBT) of the stool. You may have this test every year starting at age 53.  Flexible sigmoidoscopy or colonoscopy. You may have a sigmoidoscopy every 5 years or a colonoscopy every 10 years starting at age 61.  Hepatitis C blood test.  Hepatitis B blood test.  Sexually transmitted disease (STD) testing.  Diabetes screening. This is done by checking your blood sugar (glucose) after you have not eaten for a while (fasting). You may have this done every 1-3 years.  Bone density scan. This is done to screen for osteoporosis. You may have this done starting at age 40.  Mammogram. This may be done every 1-2 years. Talk to your health care provider about how often you should have regular mammograms. Talk with your health care provider about your test results, treatment options, and if necessary, the need for more tests. Vaccines  Your health care provider may  recommend certain vaccines, such as:  Influenza vaccine. This is recommended every year.  Tetanus, diphtheria, and acellular pertussis (Tdap, Td) vaccine. You may need a Td booster every 10 years.  Zoster vaccine. You may need this after age 13.  Pneumococcal 13-valent  conjugate (PCV13) vaccine. One dose is recommended after age 47.  Pneumococcal polysaccharide (PPSV23) vaccine. One dose is recommended after age 22. Talk to your health care provider about which screenings and vaccines you need and how often you need them. This information is not intended to replace advice given to you by your health care provider. Make sure you discuss any questions you have with your health care provider. Document Released: 08/28/2015 Document Revised: 04/20/2016 Document Reviewed: 06/02/2015 Elsevier Interactive Patient Education  2017 [place] Prevention in the Home Falls can cause injuries. They can happen to people of all ages. There are many things you can do to make your home safe and to help prevent falls. What can I do on the outside of my home?  Regularly fix the edges of walkways and driveways and fix any cracks.  Remove anything that might make you trip as you walk through a door, such as a raised step or threshold.  Trim any bushes or trees on the path to your home.  Use bright outdoor lighting.  Clear any walking paths of anything that might make someone trip, such as rocks or tools.  Regularly check to see if handrails are loose or broken. Make sure that both sides of any steps have handrails.  Any raised decks and porches should have guardrails on the edges.  Have any leaves, snow, or ice cleared regularly.  Use sand or salt on walking paths during winter.  Clean up any spills in your garage right away. This includes oil or grease spills. What can I do in the bathroom?  Use night lights.  Install grab bars by the toilet and in the tub and shower. Do not use towel bars as grab bars.  Use non-skid mats or decals in the tub or shower.  If you need to sit down in the shower, use a plastic, non-slip stool.  Keep the floor dry. Clean up any water that spills on the floor as soon as it happens.  Remove soap buildup in the tub or  shower regularly.  Attach bath mats securely with double-sided non-slip rug tape.  Do not have throw rugs and other things on the floor that can make you trip. What can I do in the bedroom?  Use night lights.  Make sure that you have a light by your bed that is easy to reach.  Do not use any sheets or blankets that are too big for your bed. They should not hang down onto the floor.  Have a firm chair that has side arms. You can use this for support while you get dressed.  Do not have throw rugs and other things on the floor that can make you trip. What can I do in the kitchen?  Clean up any spills right away.  Avoid walking on wet floors.  Keep items that you use a lot in easy-to-reach places.  If you need to reach something above you, use a strong step stool that has a grab bar.  Keep electrical cords out of the way.  Do not use floor polish or wax that makes floors slippery. If you must use wax, use non-skid floor wax.  Do not have throw rugs and  other things on the floor that can make you trip. What can I do with my stairs?  Do not leave any items on the stairs.  Make sure that there are handrails on both sides of the stairs and use them. Fix handrails that are broken or loose. Make sure that handrails are as long as the stairways.  Check any carpeting to make sure that it is firmly attached to the stairs. Fix any carpet that is loose or worn.  Avoid having throw rugs at the top or bottom of the stairs. If you do have throw rugs, attach them to the floor with carpet tape.  Make sure that you have a light switch at the top of the stairs and the bottom of the stairs. If you do not have them, ask someone to add them for you. What else can I do to help prevent falls?  Wear shoes that:  Do not have high heels.  Have rubber bottoms.  Are comfortable and fit you well.  Are closed at the toe. Do not wear sandals.  If you use a stepladder:  Make sure that it is fully  opened. Do not climb a closed stepladder.  Make sure that both sides of the stepladder are locked into place.  Ask someone to hold it for you, if possible.  Clearly mark and make sure that you can see:  Any grab bars or handrails.  First and last steps.  Where the edge of each step is.  Use tools that help you move around (mobility aids) if they are needed. These include:  Canes.  Walkers.  Scooters.  Crutches.  Turn on the lights when you go into a dark area. Replace any light bulbs as soon as they burn out.  Set up your furniture so you have a clear path. Avoid moving your furniture around.  If any of your floors are uneven, fix them.  If there are any pets around you, be aware of where they are.  Review your medicines with your doctor. Some medicines can make you feel dizzy. This can increase your chance of falling. Ask your doctor what other things that you can do to help prevent falls. This information is not intended to replace advice given to you by your health care provider. Make sure you discuss any questions you have with your health care provider. Document Released: 05/28/2009 Document Revised: 01/07/2016 Document Reviewed: 09/05/2014 Elsevier Interactive Patient Education  2017 [employer].

## 2019-02-06 NOTE — Progress Notes (Signed)
Subjective:   [name] [name] is a 73 y.o. female who presents for Medicare Annual (Subsequent) preventive examination.  Location of Patient: Home Location of Provider: Telehealth Consent was obtain for visit to be over via telehealth. I verified that I am speaking with the correct person using two identifiers.  Review of Systems:    Cardiac Risk Factors include: advanced age (>61men, >71 women);diabetes mellitus;dyslipidemia;hypertension     Objective:     Vitals: BP 120/70   Pulse 82   Resp 12   Ht 5\' 6"  (1.676 m)   Wt 186 lb (84.4 kg)   BMI 30.02 kg/m   Body mass index is 30.02 kg/m.  Advanced Directives 09/30/2018 07/08/2018 03/06/2018 02/01/2018 09/29/2016 05/17/2016 01/01/2016  Does Patient Have a Medical Advance Directive? No No No No No No No  Copy of Healthcare Power of Attorney in Chart? - - - - - - -  Would patient like information on creating a medical advance directive? No - Patient declined - - Yes (MAU/Ambulatory/Procedural Areas - Information given) Yes (MAU/Ambulatory/Procedural Areas - Information given) No - patient declined information No - patient declined information  Pre-existing out of facility DNR order (yellow form or pink MOST form) - - - - - - -    Tobacco Social History   Tobacco Use  Smoking Status Never Smoker  Smokeless Tobacco Never Used     Counseling given: Yes   Clinical Intake:  Pre-visit preparation completed: Yes  Pain : No/denies pain Pain Score: 0-No pain     BMI - recorded: 30.02 Nutritional Status: BMI > 30  Obese Nutritional Risks: None Diabetes: Yes CBG done?: No Did pt. bring in CBG monitor from home?: No  How often do you need to have someone help you when you read instructions, pamphlets, or other written materials from your doctor or pharmacy?: 1 - Never What is the last grade level you completed in school?: 11  Interpreter Needed?: No     Past Medical History:  Diagnosis Date  . Anxiety   . Aortic  stenosis   . Arthritis   . Chronic systolic heart failure ([place])   . CKD (chronic kidney disease) stage 3, GFR 30-59 ml/min (HCC)   . Constipation   . Depression   . Diabetes mellitus, type 2 ([place])   . Essential hypertension, benign   . Hyperlipidemia   . Incidental pulmonary nodule, > 55mm and < 40mm    7 x 4 mm LLL nodule  . LBBB (left bundle branch block)   . Nonischemic cardiomyopathy (HCC)    No significant obstructive CAD at heart catheterization 05/2014, LVEF 20%  . Obesity   . Pneumonia 2013  . 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 Symptomatic PVCs    Past Surgical History:  Procedure Laterality Date  . CARDIAC CATHETERIZATION    . CESAREAN SECTION  1987  . FLEXIBLE BRONCHOSCOPY N/A 01/14/2015   Procedure: FLEXIBLE BRONCHOSCOPY;  Surgeon: [name], MD;  Location: [place];  Service: Thoracic;  Laterality: N/A;  . LEFT AND RIGHT HEART CATHETERIZATION WITH CORONARY ANGIOGRAM N/A 12/05/2011   Procedure: LEFT AND RIGHT HEART CATHETERIZATION WITH CORONARY ANGIOGRAM;  Surgeon: [name], MD;  Location: [facility] CATH LAB;  Service: Cardiovascular;  Laterality: N/A;  . LEFT AND RIGHT HEART CATHETERIZATION WITH CORONARY ANGIOGRAM N/A 05/23/2014   Procedure: LEFT AND RIGHT HEART CATHETERIZATION WITH CORONARY  ANGIOGRAM;  Surgeon: [name], MD;  Location: [facility] CATH LAB;  Service: Cardiovascular;  Laterality: N/A;  . MULTIPLE EXTRACTIONS WITH ALVEOLOPLASTY N/A 02/27/2014   Procedure: Extraction of tooth #'s 2,4,5,6,7,8,9,10,11,12, 22, 23, 24, 25, 26, 28 with alveoloplasty and bilateral mandibular tori reductions;  Surgeon: [name], DDS;  Location: [place];  Service: Oral Surgery;  Laterality: N/A;  . RIGID BRONCHOSCOPY N/A 01/14/2015   Procedure: RIGID BRONCHOSCOPY with Removal Of Foreign Body ;  Surgeon: [name], MD;  Location: [place];  Service: Thoracic;  Laterality: N/A;  . TEE WITHOUT CARDIOVERSION N/A 03/24/2015   Procedure: TRANSESOPHAGEAL ECHOCARDIOGRAM (TEE);  Surgeon: [name], MD;  Location: [place];  Service: Open Heart Surgery;  Laterality: N/A;  . TRANSCATHETER AORTIC VALVE REPLACEMENT, TRANSFEMORAL Bilateral 03/24/2015   Procedure: TRANSCATHETER AORTIC VALVE REPLACEMENT, TRANSFEMORAL;  Surgeon: [name], MD;  Location: [place];  Service: Open Heart Surgery;  Laterality: Bilateral;  . TUBAL LIGATION  1987  . VIDEO BRONCHOSCOPY Bilateral 06/05/2014   Procedure: VIDEO BRONCHOSCOPY WITHOUT FLUORO;  Surgeon: [name], MD;  Location: [facility] ENDOSCOPY;  Service: Cardiopulmonary;  Laterality: Bilateral;   Family History  Problem Relation Age of Onset  . Heart disease Mother   . Heart attack Mother 37  . Diabetes Mother   . Hypertension Mother   . Colon cancer Father 67  . Breast cancer Sister   . Diabetes Brother   . Hypertension Brother   . Stroke Brother   . Cancer Brother   . Congestive Heart Failure Daughter   . Hypertension Son   . High Cholesterol Son   . Congestive Heart Failure Son    Social History   Socioeconomic History  . Marital status: Widowed    Spouse name: Not on file  . Number of children: 5  . Years of education: Not on file  . Highest education level: Not on file  Occupational History  . Occupation: Retired  Scientific laboratory technician  . Financial resource strain: Somewhat hard  . Food insecurity    Worry: Never true    Inability: Never true  . Transportation needs    Medical: No    Non-medical: No  Tobacco Use  . Smoking status: Never Smoker  . Smokeless tobacco: Never Used  Substance and Sexual Activity  . Alcohol use: No  . Drug use: No  . Sexual activity: Never    Birth control/protection: None, Post-menopausal  Lifestyle  . Physical activity    Days per week: 2 days    Minutes per session: 20 min  . Stress: Not at all  Relationships  . Social  connections    Talks on phone: More than three times a week    Gets together: More than three times a week    Attends religious service: More than 4 times per year    Active member of club or organization: No    Attends meetings of clubs or organizations: Never    Relationship status: Widowed  Other Topics Concern  . Not on file  Social History Narrative  . Not on file    Outpatient Encounter Medications as of 02/06/2019  Medication Sig  . acetaminophen (TYLENOL) 500 MG tablet Take 1,000 mg by mouth every 6 (six) hours as needed for mild pain or moderate pain.   Marland Kitchen aspirin EC 81 MG tablet Take 81 mg by mouth every morning.   . carvedilol (COREG) 25 MG tablet TAKE 1 TABLET BY MOUTH TWICE DAILY  WITH MEALS. (Patient taking differently: Take 25 mg by mouth 2 (two) times daily with a meal. )  . ezetimibe (ZETIA) 10 MG tablet TAKE 1 TABLET BY MOUTH ONCE A DAY.  . fenofibrate (TRICOR) 145 MG tablet TAKE (1) TABLET BY MOUTH ONCE DAILY.  . fluticasone (FLONASE) 50 MCG/ACT nasal spray Place 2 sprays into both nostrils daily. (Patient taking differently: Place 2 sprays into both nostrils daily as needed for allergies or rhinitis. )  . furosemide (LASIX) 40 MG tablet TAKE 1 TABLET BY MOUTH EVERY MORNING.  . GLIPIZIDE XL 5 MG 24 hr tablet TAKE 1 TABLET BY MOUTH DAILY WITH BREAKFAST.  Marland Kitchen glucose blood (ONE TOUCH ULTRA TEST) test strip Use as instructed three times daily dx: e11.65  . Lancets (ONETOUCH DELICA PLUS TFTDDU20U) MISC USE 3 TIMES A DAY AS DIRECTED.  Marland Kitchen lidocaine-prilocaine (EMLA) cream Apply 1-2 application topically 3 (three) times daily as needed.  Marland Kitchen LITETOUCH PEN NEEDLES 31G X 8 MM MISC USE AS NEEDED TO INJECT INSULIN.  . methocarbamol (ROBAXIN) 500 MG tablet Take 1 tablet (500 mg total) by mouth every 6 (six) hours as needed for muscle spasms (chest pain).  . Multiple Vitamins-Minerals (MULTIVITAMIN PO) Take 1 tablet by mouth every morning.   . niacin (NIASPAN) 500 MG CR tablet TAKE (1)  TABLET BY MOUTH AT BEDTIME. (Patient taking differently: Take 500 mg by mouth at bedtime. )  . NOVOLOG MIX 70/30 FLEXPEN (70-30) 100 UNIT/ML FlexPen INJECT 12 TO 15 UNITS SUBCUTANEOUSLY TWICE DAILY.  Marland Kitchen Pitavastatin Calcium (LIVALO) 2 MG TABS TAKE ONE TABLET BY MOUTH ONCE DAILY AFTER SUPPER.  Marland Kitchen potassium chloride (K-DUR) 10 MEQ tablet Take 1 tablet (10 mEq total) by mouth daily. TAKE ONE TABLET BY MOUTH DAILY. DO NOT TAKE IF NOT TAKING FUROSEMIDE.  Marland Kitchen spironolactone (ALDACTONE) 25 MG tablet TAKE 1 TABLET BY MOUTH ONCE DAILY.  . [DISCONTINUED] cetirizine (ZYRTEC) 10 MG tablet Take 1 tablet (10 mg total) by mouth daily.  . [DISCONTINUED] ferrous sulfate 325 (65 FE) MG EC tablet Take 1 tablet (325 mg total) by mouth 2 (two) times daily.  . [DISCONTINUED] FLUoxetine (PROZAC) 10 MG tablet Take 1 tablet (10 mg total) by mouth daily. Take one capsule by mouth once a day   No facility-administered encounter medications on file as of 02/06/2019.     Activities of Daily Living In your present state of health, do you have any difficulty performing the following activities: 02/06/2019  Hearing? N  Vision? N  Difficulty concentrating or making decisions? N  Walking or climbing stairs? N  Dressing or bathing? N  Doing errands, shopping? N  Preparing Food and eating ? N  Using the Toilet? N  In the past six months, have you accidently leaked urine? N  Do you have problems with loss of bowel control? N  Managing your Medications? N  Housekeeping or managing your Housekeeping? N  Some recent data might be hidden    Patient Care Team: Fayrene Helper, MD as PCP - General Evans Lance, MD (Cardiology) [name], MD as Consulting Physician (Cardiology)    Assessment:   This is a routine wellness examination for [facility].  Exercise Activities and Dietary recommendations Current Exercise Habits: Home exercise routine, Type of exercise: walking, Time (Minutes): 20, Frequency (Times/Week): 3,  Weekly Exercise (Minutes/Week): 60, Intensity: Mild, Exercise limited by: None identified  Goals    . Exercise 3x per week (30 min per time)     Patient would like to  increase her exercise to 4 days a weeks for 20-30 minutes at a time.        Fall Risk Fall Risk  02/06/2019 01/30/2019 01/03/2019 06/27/2018 02/08/2018  Falls in the past year? 0 0 0 0 No  Injury with Fall? 0 0 0 - -   Is the patient's home free of loose throw rugs in walkways, pet beds, electrical cords, etc?   yes      Grab bars in the bathroom? yes      Handrails on the stairs?   yes      Adequate lighting?   yes     Depression Screen PHQ 2/9 Scores 02/06/2019 01/30/2019 01/03/2019 11/12/2018  PHQ - 2 Score 1 0 0 0  PHQ- 9 Score 1 - 0 1     Cognitive Function     6CIT Screen 02/06/2019 02/01/2018 09/29/2016  What Year? 0 points 0 points 0 points  What month? 0 points 0 points 0 points  What time? 0 points 0 points 0 points  Count back from 20 0 points 0 points 0 points  Months in reverse 0 points 0 points 0 points  Repeat phrase 2 points 0 points 0 points  Total Score 2 0 0    Immunization History  Administered Date(s) Administered  . Influenza Split 06/30/2011  . Influenza Whole 09/18/2007, 05/28/2009, 06/15/2010  . Influenza, High Dose Seasonal PF 06/27/2018  . Influenza,inj,Quad PF,6+ Mos 05/21/2013, 04/23/2014, 08/18/2015, 06/23/2016, 07/31/2017  . Influenza-Unspecified 04/15/2014  . Pneumococcal Conjugate-13 09/16/2014  . Pneumococcal Polysaccharide-23 05/24/2004, 12/20/2010  . Td 05/24/2004  . Tdap 06/30/2011    Qualifies for Shingles Vaccine? Needs to find out coverage  Screening Tests Health Maintenance  Topic Date Due  . COLONOSCOPY  06/17/1995  . DEXA SCAN  06/16/2010  . OPHTHALMOLOGY EXAM  05/31/2017  . FOOT EXAM  07/31/2018  . HEMOGLOBIN A1C  12/26/2018  . URINE MICROALBUMIN  02/10/2019  . INFLUENZA VACCINE  03/16/2019  . MAMMOGRAM  02/02/2020  . TETANUS/TDAP  06/29/2021  .  Hepatitis C Screening  Completed  . PNA vac Low Risk Adult  Completed    Cancer Screenings: Lung: Low Dose CT Chest recommended if Age 65-80 years, 30 pack-year currently smoking OR have quit w/in 15years. Patient does not qualify. Breast:  Up to date on Mammogram? Yes   Up to date of Bone Density/Dexa? No-needs to have it order Colorectal: GI office is helping set this up  Additional Screenings:  Hepatitis C Screening: completed      Plan:      1. Encounter for Medicare annual wellness exam  I have personally reviewed and noted the following in the patient's chart:   . Medical and social history . Use of alcohol, tobacco or illicit drugs  . Current medications and supplements . Functional ability and status . Nutritional status . Physical activity . Advanced directives . List of other physicians . Hospitalizations, surgeries, and ER visits in previous 12 months . Vitals . Screenings to include cognitive, depression, and falls . Referrals and appointments  In addition, I have reviewed and discussed with patient certain preventive protocols, quality metrics, and best practice recommendations. A written personalized care plan for preventive services as well as general preventive health recommendations were provided to patient.    I provided 20 minutes of non-face-to-face time during this encounter.   Perlie Mayo, NP  02/06/2019

## 2019-02-08 ENCOUNTER — Ambulatory Visit: Payer: PPO | Admitting: Family Medicine

## 2019-02-14 ENCOUNTER — Other Ambulatory Visit: Payer: Self-pay | Admitting: Family Medicine

## 2019-02-18 ENCOUNTER — Inpatient Hospital Stay (HOSPITAL_COMMUNITY): Admission: RE | Admit: 2019-02-18 | Payer: PPO | Source: Ambulatory Visit

## 2019-02-19 ENCOUNTER — Other Ambulatory Visit: Payer: Self-pay | Admitting: Family Medicine

## 2019-03-07 ENCOUNTER — Other Ambulatory Visit: Payer: Self-pay | Admitting: Cardiology

## 2019-03-13 ENCOUNTER — Other Ambulatory Visit: Payer: Self-pay

## 2019-03-13 ENCOUNTER — Encounter (INDEPENDENT_AMBULATORY_CARE_PROVIDER_SITE_OTHER): Payer: Self-pay

## 2019-03-13 ENCOUNTER — Encounter: Payer: Self-pay | Admitting: Family Medicine

## 2019-03-13 ENCOUNTER — Ambulatory Visit (INDEPENDENT_AMBULATORY_CARE_PROVIDER_SITE_OTHER): Payer: PPO | Admitting: Family Medicine

## 2019-03-13 VITALS — Ht 66.0 in | Wt 186.0 lb

## 2019-03-13 DIAGNOSIS — M25561 Pain in right knee: Secondary | ICD-10-CM | POA: Diagnosis not present

## 2019-03-13 DIAGNOSIS — M25562 Pain in left knee: Secondary | ICD-10-CM | POA: Diagnosis not present

## 2019-03-13 NOTE — Progress Notes (Signed)
[name] [name] is a 73 y.o. female   Location of Patient: Home Location of Provider: Telehealth Consent was obtain for visit to be over via telehealth. I verified that I am speaking with the correct person using two identifiers.   HPI:  Knee Pain: Patient presents achy involving the  bilateral knee. Onset of the symptoms was a week ago. Inciting event: walking several days with granddaughter and then pain started.She reports not wearing tennis shoes, rather she had on slides and walked on pavement. Current symptoms include pain located joint/behind and front of knee and stiffness. Pain is aggravated by any weight bearing, going up and down stairs, rising after sitting and walking.  Patient has had prior knee problems. Evaluation to date: none. Treatment to date: brace which is effective, corticosteroid injection which was effective, OTC analgesics which are somewhat effective and rest. Her main concern was medications. She has poor kidney function and wanted to make sure she was not using the wrong meds for pain relief. She reports it is already improving. She reports taking Voltaren OTC, ES tylenol and her daughter got Naproxen. However she did not use the naproxen thinking it was bad for kidneys. She reports that Dr Aline Brochure did the injections and gave brace, those worked well. But does not want injection right now. She thinks it is just arthritis.  She has no other complaints today.  Today patient denies signs and symptoms of COVID 19 infection including fever, chills, cough, shortness of breath, and headache.  Past Medical, Surgical, Social History, Allergies, and Medications have been Reviewed.  Past Medical History:  Diagnosis Date  . Anxiety   . Aortic stenosis   . Arthritis   . Chronic systolic heart failure ([place])   . CKD (chronic kidney disease) stage 3, GFR 30-59 ml/min (HCC)   . Constipation   . Depression   . Diabetes mellitus, type 2 ([place])   . Essential hypertension, benign    . Hyperlipidemia   . Incidental pulmonary nodule, > 89mm and < 20mm    7 x 4 mm LLL nodule  . LBBB (left bundle branch block)   . Nonischemic cardiomyopathy (HCC)    No significant obstructive CAD at heart catheterization 05/2014, LVEF 20%  . Obesity   . Pneumonia 2013  . 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 Symptomatic PVCs    Past Surgical History:  Procedure Laterality Date  . CARDIAC CATHETERIZATION    . CESAREAN SECTION  1987  . FLEXIBLE BRONCHOSCOPY N/A 01/14/2015   Procedure: FLEXIBLE BRONCHOSCOPY;  Surgeon: [name], MD;  Location: [place];  Service: Thoracic;  Laterality: N/A;  . LEFT AND RIGHT HEART CATHETERIZATION WITH CORONARY ANGIOGRAM N/A 12/05/2011   Procedure: LEFT AND RIGHT HEART CATHETERIZATION WITH CORONARY ANGIOGRAM;  Surgeon: [name], MD;  Location: [facility] CATH LAB;  Service: Cardiovascular;  Laterality: N/A;  . LEFT AND RIGHT HEART CATHETERIZATION WITH CORONARY ANGIOGRAM N/A 05/23/2014   Procedure: LEFT AND RIGHT HEART CATHETERIZATION WITH CORONARY ANGIOGRAM;  Surgeon: [name], MD;  Location: [facility] CATH LAB;  Service: Cardiovascular;  Laterality: N/A;  . MULTIPLE EXTRACTIONS WITH ALVEOLOPLASTY N/A 02/27/2014   Procedure: Extraction of tooth #'s 2,4,5,6,7,8,9,10,11,12, 22, 23, 24, 25, 26, 28 with alveoloplasty and bilateral mandibular tori reductions;  Surgeon: [name], DDS;  Location: [place];  Service: Oral Surgery;  Laterality: N/A;  . RIGID BRONCHOSCOPY N/A  01/14/2015   Procedure: RIGID BRONCHOSCOPY with Removal Of Foreign Body ;  Surgeon: [name], MD;  Location: [place];  Service: Thoracic;  Laterality: N/A;  . TEE WITHOUT CARDIOVERSION N/A 03/24/2015   Procedure: TRANSESOPHAGEAL ECHOCARDIOGRAM (TEE);  Surgeon: [name], MD;  Location: [place];  Service: Open Heart Surgery;  Laterality: N/A;   . TRANSCATHETER AORTIC VALVE REPLACEMENT, TRANSFEMORAL Bilateral 03/24/2015   Procedure: TRANSCATHETER AORTIC VALVE REPLACEMENT, TRANSFEMORAL;  Surgeon: [name], MD;  Location: [place];  Service: Open Heart Surgery;  Laterality: Bilateral;  . TUBAL LIGATION  1987  . VIDEO BRONCHOSCOPY Bilateral 06/05/2014   Procedure: VIDEO BRONCHOSCOPY WITHOUT FLUORO;  Surgeon: [name], MD;  Location: [facility] ENDOSCOPY;  Service: Cardiopulmonary;  Laterality: Bilateral;    Current Outpatient Medications:  .  acetaminophen (TYLENOL) 500 MG tablet, Take 1,000 mg by mouth every 6 (six) hours as needed for mild pain or moderate pain. , Disp: , Rfl:  .  aspirin EC 81 MG tablet, Take 81 mg by mouth every morning. , Disp: , Rfl:  .  carvedilol (COREG) 25 MG tablet, TAKE 1 TABLET BY MOUTH TWICE DAILY WITH MEALS., Disp: 60 tablet, Rfl: 0 .  ezetimibe (ZETIA) 10 MG tablet, TAKE 1 TABLET BY MOUTH ONCE A DAY., Disp: 90 tablet, Rfl: 0 .  fenofibrate (TRICOR) 145 MG tablet, TAKE (1) TABLET BY MOUTH ONCE DAILY., Disp: 30 tablet, Rfl: 0 .  fluticasone (FLONASE) 50 MCG/ACT nasal spray, Place 2 sprays into both nostrils daily. (Patient taking differently: Place 2 sprays into both nostrils daily as needed for allergies or rhinitis. ), Disp: 16 g, Rfl: 6 .  furosemide (LASIX) 40 MG tablet, TAKE 1 TABLET BY MOUTH EVERY MORNING., Disp: 30 tablet, Rfl: 0 .  GLIPIZIDE XL 5 MG 24 hr tablet, TAKE 1 TABLET BY MOUTH DAILY WITH BREAKFAST., Disp: 90 tablet, Rfl: 1 .  glucose blood (ONE TOUCH ULTRA TEST) test strip, Use as instructed three times daily dx: e11.65, Disp: 100 each, Rfl: 5 .  Lancets (ONETOUCH DELICA PLUS [id number]) MISC, USE 3 TIMES A DAY AS DIRECTED., Disp: 100 each, Rfl: 0 .  lidocaine-prilocaine (EMLA) cream, Apply 1-2 application topically 3 (three) times daily as needed., Disp: , Rfl:  .  LITETOUCH PEN NEEDLES 31G X 8 MM MISC, USE AS NEEDED TO INJECT INSULIN., Disp: 100 each, Rfl: 0 .  methocarbamol  (ROBAXIN) 500 MG tablet, Take 1 tablet (500 mg total) by mouth every 6 (six) hours as needed for muscle spasms (chest pain)., Disp: 40 tablet, Rfl: 0 .  Multiple Vitamins-Minerals (MULTIVITAMIN PO), Take 1 tablet by mouth every morning. , Disp: , Rfl:  .  niacin (NIASPAN) 500 MG CR tablet, TAKE (1) TABLET BY MOUTH AT BEDTIME. (Patient taking differently: Take 500 mg by mouth at bedtime. ), Disp: 90 tablet, Rfl: 0 .  NOVOLOG MIX 70/30 FLEXPEN (70-30) 100 UNIT/ML FlexPen, INJECT 12 TO 15 UNITS SUBCUTANEOUSLY TWICE DAILY., Disp: 15 mL, Rfl: 3 .  Pitavastatin Calcium (LIVALO) 2 MG TABS, TAKE ONE TABLET BY MOUTH ONCE DAILY AFTER SUPPER., Disp: 90 tablet, Rfl: 0 .  potassium chloride (K-DUR) 10 MEQ tablet, TAKE ONE TABLET BY MOUTH DAILY. DO NOT TAKE IF NOT TAKING FUROSEMIDE., Disp: 30 tablet, Rfl: 0 .  spironolactone (ALDACTONE) 25 MG tablet, TAKE 1 TABLET BY MOUTH ONCE DAILY., Disp: 90 tablet, Rfl: 0 Allergies  Allergen Reactions  . Crestor [Rosuvastatin] Other (See Comments)    Muscle aches and elevated CK  . Tramadol Nausea  Only    States blood sugar elevated also  . Penicillins Other (See Comments)    Family unsure of reaction, but certain mom said she was allergic    reports that she has never smoked. She has never used smokeless tobacco. She reports that she does not drink alcohol or use drugs. Family History  Problem Relation Age of Onset  . Heart disease Mother   . Heart attack Mother 6  . Diabetes Mother   . Hypertension Mother   . Colon cancer Father 2  . Breast cancer Sister   . Diabetes Brother   . Hypertension Brother   . Stroke Brother   . Cancer Brother   . Congestive Heart Failure Daughter   . Hypertension Son   . High Cholesterol Son   . Congestive Heart Failure Son     Due to nature of visit PE of knee is not performed. Physical Exam Pulmonary:     Comments: No shortness of breath during conversation  Neurological:     Mental Status: She is alert and oriented to  person, place, and time.  Psychiatric:        Mood and Affect: Mood normal.        Behavior: Behavior normal.        Thought Content: Thought content normal.        Judgment: Judgment normal.    Plan 1. Acute pain of both knees Encourage mobility with the implementation of RICE measures. AVS mailed Can se Dr Aline Brochure again if needed.   I provided 10 minutes of non-face-to-face time during this encounter.  Follow Up: PRN  Perlie Mayo, DNP, AGNP-BC [address], [address] [place], [place] [address] Office Hours: Mon-Thurs 8 am-5 pm; Fri 8 am-12 pm Office Phone:  [phone]  Office Fax: [phone]

## 2019-03-13 NOTE — Patient Instructions (Addendum)
Thank you for coming into the office today. I appreciate the opportunity to provide you with the care for your health and wellness. Today we discussed: knee pain  Follow Up: as needed  No labs or referrals  I have attached info on knee pain for review  Use only Extra strength tylenol due to kidney function being low. Use ice or heat pad for comfort. Brace as needed. Rest when you can.  Keep walking though this is very beneficial for arthritis pains.  Please continue to practice social distancing to keep you, your family, and our community safe.  If you must go out, please wear a Mask and practice good handwashing.  [place] YOUR HANDS WELL AND FREQUENTLY. AVOID TOUCHING YOUR FACE, UNLESS YOUR HANDS ARE FRESHLY WASHED.  GET FRESH AIR DAILY. STAY HYDRATED WITH WATER.   It was a pleasure to see you and I look forward to continuing to work together on your health and well-being. Please do not hesitate to call the office if you need care or have questions about your care.  Have a wonderful day and week. With Gratitude, Cherly Beach, DNP, AGNP-BC   Arthritis/Knee Pain Arthritis means joint pain. It can also mean joint disease. A joint is a place where bones come together. There are more than 100 types of arthritis. What are the causes? This condition may be caused by:  Wear and tear of a joint. This is the most common cause.  A lot of acid in the blood, which leads to pain in the joint (gout).  Pain and swelling (inflammation) in a joint.  Infection of a joint.  Injuries in the joint.  A reaction to medicines (allergy). In some cases, the cause may not be known. What are the signs or symptoms? Symptoms of this condition include:  Redness at a joint.  Swelling at a joint.  Stiffness at a joint.  Warmth coming from the joint.  A fever.  A feeling of being sick. How is this treated? This condition may be treated with:  Treating the cause, if it is  known.  Rest.  Raising (elevating) the joint.  Putting cold or hot packs on the joint.  Medicines to treat symptoms and reduce pain and swelling.  Shots of medicines (cortisone) into the joint. You may also be told to make changes in your life, such as doing exercises and losing weight. Follow these instructions at home: Medicines  Take over-the-counter and prescription medicines only as told by your doctor.  Do not take aspirin for pain if your doctor says that you may have gout. Activity  Rest your joint if your doctor tells you to.  Avoid activities that make the pain worse.  Exercise your joint regularly as told by your doctor. Try doing exercises like: ? Swimming. ? Water aerobics. ? Biking. ? Walking. Managing pain, stiffness, and swelling      If told, put ice on the affected area. ? Put ice in a plastic bag. ? Place a towel between your skin and the bag. ? Leave the ice on for 20 minutes, 2-3 times per day.  If your joint is swollen, raise (elevate) it above the level of your heart if told by your doctor.  If your joint feels stiff in the morning, try taking a warm shower.  If told, put heat on the affected area. Do this as often as told by your doctor. Use the heat source that your doctor recommends, such as a moist heat pack or  a heating pad. If you have diabetes, do not apply heat without asking your doctor. To apply heat: ? Place a towel between your skin and the heat source. ? Leave the heat on for 20-30 minutes. ? Remove the heat if your skin turns bright red. This is very important if you are unable to feel pain, heat, or cold. You may have a greater risk of getting burned. General instructions  Do not use any products that contain nicotine or tobacco, such as cigarettes, e-cigarettes, and chewing tobacco. If you need help quitting, ask your doctor.  Keep all follow-up visits as told by your doctor. This is important. Contact a doctor if:  The pain  gets worse.  You have a fever. Get help right away if:  You have very bad pain in your joint.  You have swelling in your joint.  Your joint is red.  Many joints become painful and swollen.  You have very bad back pain.  Your leg is very weak.  You cannot control your pee (urine) or poop (stool). Summary  Arthritis means joint pain. It can also mean joint disease. A joint is a place where bones come together.  The most common cause of this condition is wear and tear of a joint.  Symptoms of this condition include redness, swelling, or stiffness of the joint.  This condition is treated with rest, raising the joint, medicines, and putting cold or hot packs on the joint.  Follow your doctor's instructions about medicines, activity, exercises, and other home care treatments. This information is not intended to replace advice given to you by your health care provider. Make sure you discuss any questions you have with your health care provider. Document Released: 10/26/2009 Document Revised: 07/09/2018 Document Reviewed: 07/09/2018 Elsevier Patient Education  2020 [employer].

## 2019-03-19 ENCOUNTER — Other Ambulatory Visit: Payer: Self-pay | Admitting: Family Medicine

## 2019-03-26 DIAGNOSIS — E1122 Type 2 diabetes mellitus with diabetic chronic kidney disease: Secondary | ICD-10-CM | POA: Diagnosis not present

## 2019-03-26 DIAGNOSIS — I129 Hypertensive chronic kidney disease with stage 1 through stage 4 chronic kidney disease, or unspecified chronic kidney disease: Secondary | ICD-10-CM | POA: Diagnosis not present

## 2019-03-26 DIAGNOSIS — N184 Chronic kidney disease, stage 4 (severe): Secondary | ICD-10-CM | POA: Diagnosis not present

## 2019-03-26 DIAGNOSIS — E785 Hyperlipidemia, unspecified: Secondary | ICD-10-CM | POA: Diagnosis not present

## 2019-03-27 LAB — COMPLETE METABOLIC PANEL WITH GFR
AG Ratio: 1.4 (calc) (ref 1.0–2.5)
ALT: 16 U/L (ref 6–29)
AST: 24 U/L (ref 10–35)
Albumin: 4.1 g/dL (ref 3.6–5.1)
Alkaline phosphatase (APISO): 31 U/L — ABNORMAL LOW (ref 37–153)
BUN/Creatinine Ratio: 22 (calc) (ref 6–22)
BUN: 44 mg/dL — ABNORMAL HIGH (ref 7–25)
CO2: 27 mmol/L (ref 20–32)
Calcium: 10.3 mg/dL (ref 8.6–10.4)
Chloride: 104 mmol/L (ref 98–110)
Creat: 1.98 mg/dL — ABNORMAL HIGH (ref 0.60–0.93)
GFR, Est African American: 28 mL/min/{1.73_m2} — ABNORMAL LOW (ref >=60)
GFR, Est Non African American: 24 mL/min/{1.73_m2} — ABNORMAL LOW (ref >=60)
Globulin: 3 g/dL (calc) (ref 1.9–3.7)
Glucose, Bld: 161 mg/dL — ABNORMAL HIGH (ref 65–99)
Potassium: 4.4 mmol/L (ref 3.5–5.3)
Sodium: 141 mmol/L (ref 135–146)
Total Bilirubin: 0.4 mg/dL (ref 0.2–1.2)
Total Protein: 7.1 g/dL (ref 6.1–8.1)

## 2019-03-27 LAB — LIPID PANEL
Cholesterol: 162 mg/dL (ref <200)
HDL: 54 mg/dL (ref >=50)
LDL Cholesterol (Calc): 89 mg/dL (calc)
Non-HDL Cholesterol (Calc): 108 mg/dL (calc) (ref <130)
Total CHOL/HDL Ratio: 3 (calc) (ref <5.0)
Triglycerides: 101 mg/dL (ref <150)

## 2019-03-27 LAB — HEMOGLOBIN A1C
Hgb A1c MFr Bld: 7.4 % of total Hgb — ABNORMAL HIGH (ref <5.7)
Mean Plasma Glucose: 166 (calc)
eAG (mmol/L): 9.2 (calc)

## 2019-04-02 ENCOUNTER — Emergency Department (HOSPITAL_COMMUNITY)
Admission: EM | Admit: 2019-04-02 | Discharge: 2019-04-02 | Disposition: A | Discharge status: Home / Self Care | Payer: PPO | Attending: Emergency Medicine | Admitting: Emergency Medicine

## 2019-04-02 ENCOUNTER — Emergency Department (HOSPITAL_COMMUNITY): Payer: PPO

## 2019-04-02 ENCOUNTER — Encounter (HOSPITAL_COMMUNITY): Payer: Self-pay

## 2019-04-02 ENCOUNTER — Other Ambulatory Visit: Payer: Self-pay

## 2019-04-02 DIAGNOSIS — Z7982 Long term (current) use of aspirin: Secondary | ICD-10-CM | POA: Insufficient documentation

## 2019-04-02 DIAGNOSIS — Z79899 Other long term (current) drug therapy: Secondary | ICD-10-CM | POA: Diagnosis not present

## 2019-04-02 DIAGNOSIS — I5042 Chronic combined systolic (congestive) and diastolic (congestive) heart failure: Secondary | ICD-10-CM | POA: Insufficient documentation

## 2019-04-02 DIAGNOSIS — E1122 Type 2 diabetes mellitus with diabetic chronic kidney disease: Secondary | ICD-10-CM | POA: Diagnosis not present

## 2019-04-02 DIAGNOSIS — N184 Chronic kidney disease, stage 4 (severe): Secondary | ICD-10-CM | POA: Insufficient documentation

## 2019-04-02 DIAGNOSIS — R509 Fever, unspecified: Secondary | ICD-10-CM | POA: Insufficient documentation

## 2019-04-02 DIAGNOSIS — I130 Hypertensive heart and chronic kidney disease with heart failure and stage 1 through stage 4 chronic kidney disease, or unspecified chronic kidney disease: Secondary | ICD-10-CM | POA: Insufficient documentation

## 2019-04-02 DIAGNOSIS — M25562 Pain in left knee: Secondary | ICD-10-CM

## 2019-04-02 DIAGNOSIS — M1712 Unilateral primary osteoarthritis, left knee: Secondary | ICD-10-CM | POA: Diagnosis not present

## 2019-04-02 LAB — URINALYSIS, ROUTINE W REFLEX MICROSCOPIC
Bilirubin Urine: NEGATIVE
Glucose, UA: NEGATIVE mg/dL
Hgb urine dipstick: NEGATIVE
Ketones, ur: NEGATIVE mg/dL
Nitrite: NEGATIVE
Protein, ur: NEGATIVE mg/dL
Specific Gravity, Urine: 1.019 (ref 1.005–1.030)
pH: 6 (ref 5.0–8.0)

## 2019-04-02 MED ORDER — HYDROCODONE-ACETAMINOPHEN 5-325 MG PO TABS: 1.0000 | ORAL_TABLET | Freq: Four times a day (QID) | ORAL | 0 refills | Status: DC | PRN

## 2019-04-02 MED ORDER — ONDANSETRON HCL 4 MG PO TABS
4.0000 mg | ORAL_TABLET | Freq: Once | ORAL | Status: AC
  Administered 2019-04-02: 10:00:00 4 mg via ORAL
  Filled 2019-04-02: qty 1

## 2019-04-02 MED ORDER — HYDROCODONE-ACETAMINOPHEN 5-325 MG PO TABS
1.0000 | ORAL_TABLET | Freq: Once | ORAL | Status: AC
  Administered 2019-04-02: 1 via ORAL
  Filled 2019-04-02: qty 1

## 2019-04-02 NOTE — Discharge Instructions (Addendum)
Your x-ray shows increasing arthritis of your knee.  Please continue to use your brace.  Please continue to use your Voltaren gel, and your Tylenol.  May use Norco for more severe pain. This medication may cause drowsiness. Please do not drink, drive, or participate in activity that requires concentration while taking this medication.  Please call Dr. Aline Brochure for an office appointment concerning your knee as soon as possible.

## 2019-04-02 NOTE — ED Provider Notes (Signed)
[facility] EMERGENCY DEPARTMENT Provider Note   CSN: [id number] Arrival date & time: 04/02/19  [id number]     History   Chief Complaint Chief Complaint  Patient presents with  . Knee Pain    HPI [name] [name] is a 73 y.o. female.     Patient is a 73 year old female who presents to the emergency department with a complaint of left knee pain.  Patient states she is had problems with this knee for some time.  She has been told that she had "bone-on-bone".  She has been placed on Voltaren gel, and Tylenol.  The patient states this is not helping.  She now has difficulty with walking and getting around.  She says she has pain basically from her left hip down to her left foot.  But the pain is worse at her left knee.  She has had injections by orthopedics, but she says is not time for an injection yet.  She has not had any recent falls or injury to be reported.  The patient denies any hot joints.  She is not had any operations or procedures involving any of her joints.  She does not have any ulcers or areas that are not healing.  No upper respiratory symptoms reported.  No nausea vomiting or diarrhea reported.  The patient denies any known exposure to the COVID-19 virus.  She has not had any recent travel.  The history is provided by the patient.  Knee Pain Associated symptoms: no back pain and no neck pain     Past Medical History:  Diagnosis Date  . Anxiety   . Aortic stenosis   . Arthritis   . Chronic systolic heart failure ([place])   . CKD (chronic kidney disease) stage 3, GFR 30-59 ml/min (HCC)   . Constipation   . Depression   . Diabetes mellitus, type 2 ([place])   . Essential hypertension, benign   . Hyperlipidemia   . Incidental pulmonary nodule, > 61mm and < 63mm    7 x 4 mm LLL nodule  . LBBB (left bundle branch block)   . Nonischemic cardiomyopathy (HCC)    No significant obstructive CAD at heart catheterization 05/2014, LVEF 20%  . Obesity   . Pneumonia 2013  . 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 Symptomatic PVCs     Patient Active Problem List   Diagnosis Date Noted  . Screening for depression 02/10/2018  . Osteoarthritis of left knee 03/16/2017  . Pain in finger of left hand 12/14/2016  . 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 Severe aortic valve stenosis 03/24/2015  . Hyperlipidemia LDL goal <100 05/24/2014  . Severe aortic stenosis 05/23/2014  . Incidental pulmonary nodule, > 30mm and < 28mm 02/10/2014  . Chronic combined systolic and diastolic CHF (congestive heart failure) ([place])   . LBBB (left bundle branch block) 12/03/2013  . 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 Seasonal allergies 02/22/2012  . Essential hypertension, benign   . Nonischemic cardiomyopathy ([place]) 10/14/2011  . Type 2 diabetes mellitus with nephropathy ([place]) 10/24/2007  . Obesity 10/24/2007    Past Surgical History:  Procedure Laterality Date  . CARDIAC CATHETERIZATION    . CESAREAN SECTION  1987  . FLEXIBLE BRONCHOSCOPY N/A 01/14/2015   Procedure: FLEXIBLE BRONCHOSCOPY;  Surgeon: [name], MD;  Location: [place];  Service:  Thoracic;  Laterality: N/A;  . LEFT AND RIGHT HEART CATHETERIZATION WITH CORONARY ANGIOGRAM N/A 12/05/2011   Procedure: LEFT AND RIGHT HEART CATHETERIZATION WITH CORONARY ANGIOGRAM;  Surgeon: [name], MD;  Location: [facility] CATH LAB;  Service: Cardiovascular;  Laterality: N/A;  . LEFT AND RIGHT HEART CATHETERIZATION WITH CORONARY ANGIOGRAM N/A 05/23/2014   Procedure: LEFT AND RIGHT HEART CATHETERIZATION WITH CORONARY ANGIOGRAM;  Surgeon: [name], MD;  Location: [facility] CATH LAB;  Service: Cardiovascular;  Laterality: N/A;  . MULTIPLE EXTRACTIONS WITH ALVEOLOPLASTY N/A 02/27/2014   Procedure: Extraction of tooth #'s 2,4,5,6,7,8,9,10,11,12, 22, 23, 24, 25, 26, 28  with alveoloplasty and bilateral mandibular tori reductions;  Surgeon: [name], DDS;  Location: [place];  Service: Oral Surgery;  Laterality: N/A;  . RIGID BRONCHOSCOPY N/A 01/14/2015   Procedure: RIGID BRONCHOSCOPY with Removal Of Foreign Body ;  Surgeon: [name], MD;  Location: [place];  Service: Thoracic;  Laterality: N/A;  . TEE WITHOUT CARDIOVERSION N/A 03/24/2015   Procedure: TRANSESOPHAGEAL ECHOCARDIOGRAM (TEE);  Surgeon: [name], MD;  Location: [place];  Service: Open Heart Surgery;  Laterality: N/A;  . TRANSCATHETER AORTIC VALVE REPLACEMENT, TRANSFEMORAL Bilateral 03/24/2015   Procedure: TRANSCATHETER AORTIC VALVE REPLACEMENT, TRANSFEMORAL;  Surgeon: [name], MD;  Location: [place];  Service: Open Heart Surgery;  Laterality: Bilateral;  . TUBAL LIGATION  1987  . VIDEO BRONCHOSCOPY Bilateral 06/05/2014   Procedure: VIDEO BRONCHOSCOPY WITHOUT FLUORO;  Surgeon: [name], MD;  Location[facility] ENDOSCOPY;  Service: Cardiopulmonary;  Laterality: Bilateral;     OB History   No obstetric history on file.      Home Medications    Prior to Admission medications   Medication Sig Start Date End Date Taking? Authorizing Provider  acetaminophen (TYLENOL) 500 MG tablet Take 1,000 mg by mouth every 6 (six) hours as needed for mild pain or moderate pain.     [provider]  aspirin EC 81 MG tablet Take 81 mg by mouth every morning.     [provider]  carvedilol (COREG) 25 MG tablet TAKE 1 TABLET BY MOUTH TWICE DAILY WITH MEALS. 03/07/19   [name], MD  ezetimibe (ZETIA) 10 MG tablet TAKE 1 TABLET BY MOUTH ONCE A DAY. 10/22/18   Fayrene Helper, MD  fenofibrate (TRICOR) 145 MG tablet TAKE (1) TABLET BY MOUTH ONCE DAILY. 03/20/19   Fayrene Helper, MD  fluticasone (FLONASE) 50 MCG/ACT nasal spray Place 2 sprays into both nostrils daily. Patient taking differently: Place 2 sprays into both nostrils daily as needed for  allergies or rhinitis.  12/06/17   Fayrene Helper, MD  furosemide (LASIX) 40 MG tablet TAKE 1 TABLET BY MOUTH EVERY MORNING. 03/20/19   Fayrene Helper, MD  GLIPIZIDE XL 5 MG 24 hr tablet TAKE 1 TABLET BY MOUTH DAILY WITH BREAKFAST. 01/22/19   Fayrene Helper, MD  glucose blood (ONE TOUCH ULTRA TEST) test strip Use as instructed three times daily dx: e11.65 08/03/18   Fayrene Helper, MD  Lancets [facility] DELICA PLUS [id number]) [place] USE 3 TIMES A DAY AS DIRECTED. 03/20/19   Fayrene Helper, MD  lidocaine-prilocaine (EMLA) cream Apply 1-2 application topically 3 (three) times daily as needed. 08/01/17   [provider]  LITETOUCH PEN NEEDLES 31G X 8 MM MISC USE AS NEEDED TO INJECT INSULIN. 02/14/19   Fayrene Helper, MD  methocarbamol (ROBAXIN) 500 MG tablet Take 1 tablet (500 mg total) by mouth every  6 (six) hours as needed for muscle spasms (chest pain). 10/01/18   Rolland Porter, MD  Multiple Vitamins-Minerals (MULTIVITAMIN PO) Take 1 tablet by mouth every morning.     [provider]  niacin (NIASPAN) 500 MG CR tablet TAKE (1) TABLET BY MOUTH AT BEDTIME. Patient taking differently: Take 500 mg by mouth at bedtime.  09/21/18   Fayrene Helper, MD  NOVOLOG MIX 70/30 FLEXPEN (70-30) 100 UNIT/ML FlexPen INJECT 12 TO 15 UNITS SUBCUTANEOUSLY TWICE DAILY. 11/22/18   Fayrene Helper, MD  Pitavastatin Calcium (LIVALO) 2 MG TABS TAKE ONE TABLET BY MOUTH ONCE DAILY AFTER SUPPER. 02/14/19   Fayrene Helper, MD  potassium chloride (K-DUR) 10 MEQ tablet TAKE ONE TABLET BY MOUTH DAILY. DO NOT TAKE IF NOT TAKING FUROSEMIDE. 03/20/19   Fayrene Helper, MD  spironolactone (ALDACTONE) 25 MG tablet TAKE 1 TABLET BY MOUTH ONCE DAILY. 10/22/18   Fayrene Helper, MD  cetirizine (ZYRTEC) 10 MG tablet Take 1 tablet (10 mg total) by mouth daily. 06/30/11 04/19/18  Fayrene Helper, MD  ferrous sulfate 325 (65 FE) MG EC tablet Take 1 tablet (325 mg total) by mouth 2 (two) times  daily. 07/14/11 10/28/11  Fayrene Helper, MD  FLUoxetine (PROZAC) 10 MG tablet Take 1 tablet (10 mg total) by mouth daily. Take one capsule by mouth once a day 12/20/10 11/03/11  Fayrene Helper, MD    Family History Family History  Problem Relation Age of Onset  . Heart disease Mother   . Heart attack Mother 68  . Diabetes Mother   . Hypertension Mother   . Colon cancer Father 71  . Breast cancer Sister   . Diabetes Brother   . Hypertension Brother   . Stroke Brother   . Cancer Brother   . Congestive Heart Failure Daughter   . Hypertension Son   . High Cholesterol Son   . Congestive Heart Failure Son     Social History Social History   Tobacco Use  . Smoking status: Never Smoker  . Smokeless tobacco: Never Used  Substance Use Topics  . Alcohol use: No  . Drug use: No     Allergies   Crestor [rosuvastatin], Tramadol, and Penicillins   Review of Systems Review of Systems  Constitutional: Negative for activity change and appetite change.  HENT: Negative for congestion, ear discharge, ear pain, facial swelling, nosebleeds, rhinorrhea, sneezing and tinnitus.   Eyes: Negative for photophobia, pain and discharge.  Respiratory: Negative for cough, choking, shortness of breath and wheezing.   Cardiovascular: Negative for chest pain, palpitations and leg swelling.  Gastrointestinal: Negative for abdominal pain, blood in stool, constipation, diarrhea, nausea and vomiting.  Genitourinary: Negative for difficulty urinating, dysuria, flank pain, frequency and hematuria.  Musculoskeletal: Positive for arthralgias. Negative for back pain, gait problem, myalgias and neck pain.  Skin: Negative for color change, rash and wound.  Neurological: Negative for dizziness, seizures, syncope, facial asymmetry, speech difficulty, weakness and numbness.  Hematological: Negative for adenopathy. Does not bruise/bleed easily.  Psychiatric/Behavioral: Negative for agitation, confusion,  hallucinations, self-injury and suicidal ideas. The patient is not nervous/anxious.      Physical Exam Updated Vital Signs BP 127/71   Pulse 90   Temp 99.4 F (37.4 C) (Oral)   Resp 18   Ht 5\' 7"  (1.702 m)   Wt 81.6 kg   SpO2 100%   BMI 28.19 kg/m   Physical Exam Vitals signs and nursing note reviewed.  Constitutional:  Appearance: She is well-developed. She is not toxic-appearing.  HENT:     Head: Normocephalic.     Right Ear: Tympanic membrane and external ear normal.     Left Ear: Tympanic membrane and external ear normal.  Eyes:     General: Lids are normal.     Pupils: Pupils are equal, round, and reactive to light.  Neck:     Musculoskeletal: Normal range of motion and neck supple.     Vascular: No carotid bruit.  Cardiovascular:     Rate and Rhythm: Normal rate and regular rhythm.     Pulses: Normal pulses.     Heart sounds: Normal heart sounds.  Pulmonary:     Effort: No respiratory distress.     Breath sounds: Normal breath sounds.  Abdominal:     General: Bowel sounds are normal.     Palpations: Abdomen is soft.     Tenderness: There is no abdominal tenderness. There is no guarding.  Musculoskeletal: Normal range of motion.     Comments: There is pain with attempted range of motion of the left hip.  There is pain and crepitus noted of the left knee.  There is no posterior mass appreciated.  Mild effusion appreciated.  There is no deformity of the knee.  There is no deformity of the anterior tibial area.  There is soreness with attempted range of motion of the ankle.  There is full range of motion of the toes.  The dorsalis pedis pulse is 2+.  The Achilles tendon is intact.  Lymphadenopathy:     Head:     Right side of head: No submandibular adenopathy.     Left side of head: No submandibular adenopathy.     Cervical: No cervical adenopathy.  Skin:    General: Skin is warm and dry.  Neurological:     Mental Status: She is alert and oriented to person,  place, and time.     Cranial Nerves: No cranial nerve deficit.     Sensory: No sensory deficit.  Psychiatric:        Speech: Speech normal.      ED Treatments / Results  Labs (all labs ordered are listed, but only abnormal results are displayed) Labs Reviewed  URINALYSIS, ROUTINE W REFLEX MICROSCOPIC - Abnormal; Notable for the following components:      Result Value   Leukocytes,Ua SMALL (*)    Bacteria, UA RARE (*)    All other components within normal limits    EKG None  Radiology Dg Chest Portable 1 View  Result Date: 04/02/2019 CLINICAL DATA:  Fever.  History of pneumonia EXAM: PORTABLE CHEST 1 VIEW COMPARISON:  09/30/2018 FINDINGS: Cardiomegaly. Stable platelike linear atelectasis or scarring in the mid lungs bilaterally. Mild vascular congestion. No overt edema or effusions. No acute bony abnormality. IMPRESSION: Cardiomegaly, vascular congestion. Bilateral mid lung atelectasis or scarring. Electronically Signed   By: Rolm Baptise M.D.   On: 04/02/2019 10:03   Dg Knee Complete 4 Views Left  Result Date: 04/02/2019 CLINICAL DATA:  Acute on chronic knee pain for several days with fevers EXAM: LEFT KNEE - COMPLETE 4+ VIEW COMPARISON:  03/12/2017 FINDINGS: Tricompartmental degenerative changes are noted. Moderate joint effusion is seen similar to that noted on the prior exam. No acute fracture or dislocation is noted. IMPRESSION: Tricompartmental degenerative change with joint effusion. Electronically Signed   By: Inez Catalina M.D.   On: 04/02/2019 10:05    Procedures Procedures (including critical care time)  Medications Ordered in ED Medications  HYDROcodone-acetaminophen (NORCO/VICODIN) 5-325 MG per tablet 1 tablet (1 tablet Oral Given 04/02/19 0942)  ondansetron (ZOFRAN) tablet 4 mg (4 mg Oral Given 04/02/19 0942)     Initial Impression / Assessment and Plan / ED Course  I have reviewed the triage vital signs and the nursing notes.  Pertinent labs & imaging results  that were available during my care of the patient were reviewed by me and considered in my medical decision making (see chart for details).          Final Clinical Impressions(s) / ED Diagnoses MDM  Temperatures 100.2.  The pulse rate is 99, the respiratory rate is 18.  The pulse oximetry is 98% on room air.  Within normal limits by my interpretation.  An x-ray of the knee shows tricompartmental degenerative changes with joint effusion present.  The patient has a brace that she uses for her knee.  We will asked the patient to continue continue Voltaren gel and Tylenol.  We will add short course of Norco to use for pain or discomfort.  Portable chest shows cardiomegaly and vascular congestion.  There is bilateral midlung atelectasis or scarring present this is been present on previous x-rays.  The urine analysis is negative for urinary tract infection.  I have asked the patient to see the orthopedic specialist concerning her need for follow-up.     Final diagnoses:  Primary osteoarthritis of left knee  Acute pain of left knee    ED Discharge Orders         Ordered    HYDROcodone-acetaminophen (NORCO/VICODIN) 5-325 MG tablet  Every 6 hours PRN     04/02/19 1127           Lily Kocher, PA-C 04/02/19 1743    Davonna Belling, MD 04/03/19 1505

## 2019-04-02 NOTE — ED Triage Notes (Signed)
Pt reports chronic L knee pain worse for the past 2 days.  Denies injury.

## 2019-04-03 ENCOUNTER — Ambulatory Visit (HOSPITAL_COMMUNITY): Payer: PPO

## 2019-04-05 ENCOUNTER — Other Ambulatory Visit: Payer: Self-pay | Admitting: Cardiology

## 2019-04-05 ENCOUNTER — Other Ambulatory Visit: Payer: Self-pay | Admitting: Family Medicine

## 2019-04-05 DIAGNOSIS — E785 Hyperlipidemia, unspecified: Secondary | ICD-10-CM

## 2019-04-12 ENCOUNTER — Ambulatory Visit (HOSPITAL_COMMUNITY): Payer: PPO

## 2019-04-18 ENCOUNTER — Other Ambulatory Visit: Payer: Self-pay | Admitting: Family Medicine

## 2019-04-19 ENCOUNTER — Encounter: Payer: Self-pay | Admitting: Orthopedic Surgery

## 2019-04-19 ENCOUNTER — Ambulatory Visit (INDEPENDENT_AMBULATORY_CARE_PROVIDER_SITE_OTHER): Payer: PPO | Admitting: Orthopedic Surgery

## 2019-04-19 ENCOUNTER — Other Ambulatory Visit: Payer: Self-pay

## 2019-04-19 VITALS — BP 119/67 | HR 84 | Temp 97.9°F | Ht 66.0 in | Wt 176.0 lb

## 2019-04-19 DIAGNOSIS — M1712 Unilateral primary osteoarthritis, left knee: Secondary | ICD-10-CM

## 2019-04-19 DIAGNOSIS — M1710 Unilateral primary osteoarthritis, unspecified knee: Secondary | ICD-10-CM

## 2019-04-19 NOTE — Patient Instructions (Signed)
You have received an injection of steroids into the joint. 15% of patients will have increased pain within the 24 hours postinjection.   This is transient and will go away.   We recommend that you use ice packs on the injection site for 20 minutes every 2 hours and extra strength Tylenol 2 tablets every 8 as needed until the pain resolves.  If you continue to have pain after taking the Tylenol and using the ice please call the office for further instructions.   Take  xtra stregth tylenol

## 2019-04-19 NOTE — Progress Notes (Signed)
[name] [name]  04/19/2019  HISTORY SECTION :  Chief Complaint  Patient presents with  . Knee Pain    L/pain is worse and it aches/hurting for 2 to 47 wks   73 year old female previously seen by [place] presents with severe left knee pain associated with swelling and difficulty walking.  No trauma went to the ER x-rays show arthritis.  She has a cane and an osteoarthritis reaction brace.  She did complain of some radicular pain in the left side at the same time and that is what sent her to the ER   Review of Systems  Gastrointestinal: Negative.   Genitourinary: Negative.      Past Medical History:  Diagnosis Date  . Anxiety   . Aortic stenosis   . Arthritis   . Chronic systolic heart failure ([place])   . CKD (chronic kidney disease) stage 3, GFR 30-59 ml/min (HCC)   . Constipation   . Depression   . Diabetes mellitus, type 2 ([place])   . Essential hypertension, benign   . Hyperlipidemia   . Incidental pulmonary nodule, > 73mm and < 69mm    7 x 4 mm LLL nodule  . LBBB (left bundle branch block)   . Nonischemic cardiomyopathy (HCC)    No significant obstructive CAD at heart catheterization 05/2014, LVEF 20%  . Obesity   . Pneumonia 2013  . 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 Symptomatic PVCs     Past Surgical History:  Procedure Laterality Date  . CARDIAC CATHETERIZATION    . CESAREAN SECTION  1987  . FLEXIBLE BRONCHOSCOPY N/A 01/14/2015   Procedure: FLEXIBLE BRONCHOSCOPY;  Surgeon: [name], MD;  Location: [place];  Service: Thoracic;  Laterality: N/A;  . LEFT AND RIGHT HEART CATHETERIZATION WITH CORONARY ANGIOGRAM N/A 12/05/2011   Procedure: LEFT AND RIGHT HEART CATHETERIZATION WITH CORONARY ANGIOGRAM;  Surgeon: [name], MD;  Location[facility] C[facility] LAB;  Service: Cardiovascular;  Laterality: N/A;  . LEFT AND RIGHT HEART CATHETERIZATION WITH  CORONARY ANGIOGRAM N/A 05/23/2014   Procedure: LEFT AND RIGHT HEART CATHETERIZATION WITH CORONARY ANGIOGRAM;  Surgeon: [name], MD;  Location: [facility] CATH LAB;  Service: Cardiovascular;  Laterality: N/A;  . MULTIPLE EXTRACTIONS WITH ALVEOLOPLASTY N/A 02/27/2014   Procedure: Extraction of tooth #'s 2,4,5,6,7,8,9,10,11,12, 22, 23, 24, 25, 26, 28 with alveoloplasty and bilateral mandibular tori reductions;  Surgeon: [name], DDS;  Location: [place];  Service: Oral Surgery;  Laterality: N/A;  . RIGID BRONCHOSCOPY N/A 01/14/2015   Procedure: RIGID BRONCHOSCOPY with Removal Of Foreign Body ;  Surgeon: [name], MD;  Location: [place];  Service: Thoracic;  Laterality: N/A;  . TEE WITHOUT CARDIOVERSION N/A 03/24/2015   Procedure: TRANSESOPHAGEAL ECHOCARDIOGRAM (TEE);  Surgeon: [name], MD;  Location: [place];  Service: Open Heart Surgery;  Laterality: N/A;  . TRANSCATHETER AORTIC VALVE REPLACEMENT, TRANSFEMORAL Bilateral 03/24/2015   Procedure: TRANSCATHETER AORTIC VALVE REPLACEMENT, TRANSFEMORAL;  Surgeon: [name], MD;  Location: [place];  Service: Open Heart Surgery;  Laterality: Bilateral;  . TUBAL LIGATION  1987  . VIDEO BRONCHOSCOPY Bilateral 06/05/2014   Procedure: VIDEO BRONCHOSCOPY WITHOUT FLUORO;  Surgeon: [name], MD;  Location: [facility] ENDOSCOPY;  Service: Cardiopulmonary;  Laterality: Bilateral;     Allergies  Allergen Reactions  . Crestor [Rosuvastatin] Other (See Comments)    Muscle aches and elevated CK  .  Tramadol Nausea Only    States blood sugar elevated also  . Penicillins Other (See Comments)    Family unsure of reaction, but certain mom said she was allergic     Current Outpatient Medications:  .  acetaminophen (TYLENOL) 500 MG tablet, Take 1,000 mg by mouth every 6 (six) hours as needed for mild pain or moderate pain. , Disp: , Rfl:  .  aspirin EC 81 MG tablet, Take 81 mg by mouth every morning. , Disp: , Rfl:  .   carvedilol (COREG) 25 MG tablet, TAKE 1 TABLET BY MOUTH TWICE DAILY WITH MEALS., Disp: 60 tablet, Rfl: 0 .  ezetimibe (ZETIA) 10 MG tablet, TAKE 1 TABLET BY MOUTH ONCE A DAY., Disp: 90 tablet, Rfl: 0 .  fenofibrate (TRICOR) 145 MG tablet, TAKE (1) TABLET BY MOUTH ONCE DAILY., Disp: 30 tablet, Rfl: 0 .  fluticasone (FLONASE) 50 MCG/ACT nasal spray, Place 2 sprays into both nostrils daily. (Patient taking differently: Place 2 sprays into both nostrils daily as needed for allergies or rhinitis. ), Disp: 16 g, Rfl: 6 .  furosemide (LASIX) 40 MG tablet, TAKE 1 TABLET BY MOUTH EVERY MORNING., Disp: 30 tablet, Rfl: 0 .  GLIPIZIDE XL 5 MG 24 hr tablet, TAKE 1 TABLET BY MOUTH DAILY WITH BREAKFAST., Disp: 90 tablet, Rfl: 1 .  glucose blood (ONE TOUCH ULTRA TEST) test strip, Use as instructed three times daily dx: e11.65, Disp: 100 each, Rfl: 5 .  HYDROcodone-acetaminophen (NORCO/VICODIN) 5-325 MG tablet, Take 1 tablet by mouth every 6 (six) hours as needed., Disp: 12 tablet, Rfl: 0 .  Lancets (ONETOUCH DELICA PLUS 123XX123) MISC, USE 3 TIMES A DAY AS DIRECTED., Disp: 100 each, Rfl: 0 .  lidocaine-prilocaine (EMLA) cream, Apply 1-2 application topically 3 (three) times daily as needed., Disp: , Rfl:  .  LITETOUCH PEN NEEDLES 31G X 8 MM MISC, USE AS NEEDED TO INJECT INSULIN., Disp: 100 each, Rfl: 5 .  methocarbamol (ROBAXIN) 500 MG tablet, Take 1 tablet (500 mg total) by mouth every 6 (six) hours as needed for muscle spasms (chest pain)., Disp: 40 tablet, Rfl: 0 .  Multiple Vitamins-Minerals (MULTIVITAMIN PO), Take 1 tablet by mouth every morning. , Disp: , Rfl:  .  niacin (NIASPAN) 500 MG CR tablet, TAKE (1) TABLET BY MOUTH AT BEDTIME., Disp: 90 tablet, Rfl: 0 .  NOVOLOG MIX 70/30 FLEXPEN (70-30) 100 UNIT/ML FlexPen, INJECT 12 TO 15 UNITS SUBCUTANEOUSLY TWICE DAILY., Disp: 15 mL, Rfl: 3 .  Pitavastatin Calcium (LIVALO) 2 MG TABS, TAKE ONE TABLET BY MOUTH ONCE DAILY AFTER SUPPER., Disp: 90 tablet, Rfl: 0 .   potassium chloride (K-DUR) 10 MEQ tablet, TAKE ONE TABLET BY MOUTH DAILY. DO NOT TAKE IF NOT TAKING FUROSEMIDE., Disp: 30 tablet, Rfl: 0 .  spironolactone (ALDACTONE) 25 MG tablet, TAKE 1 TABLET BY MOUTH ONCE DAILY., Disp: 90 tablet, Rfl: 0   PHYSICAL EXAM SECTION: 1) BP 119/67   Pulse 84   Temp 97.9 F (36.6 C)   Ht 5\' 6"  (1.676 m)   Wt 176 lb (79.8 kg)   BMI 28.41 kg/m   Body mass index is 28.41 kg/m. General appearance: Well-developed well-nourished no gross deformities  2) Cardiovascular normal pulse and perfusion in the lower  extremities normal color without edema  3) Neurologically deep tendon reflexes are equal and normal, no sensation loss or deficits no pathologic reflexes  4) Psychological: Awake alert and oriented x3 mood and affect normal  5) Skin no lacerations or ulcerations no nodularity  no palpable masses, no erythema or nodularity  6) Musculoskeletal:   Right knee: Tenderness none range of motion normal instability none motor exam normal  Left knee medial joint line tenderness no swelling no effusion range of motion normal instability none motor exam normal  MEDICAL DECISION SECTION:  Encounter Diagnosis  Name Primary?  . Primary localized osteoarthritis of knee Yes    Imaging xrays at Hospital 4 views of the left knee show osteoarthritis medial compartment moderate some secondary bone changes are noted  Plan:  (Rx., Inj., surg., Frx, MRI/CT, XR:2)  Procedure note left knee injection   verbal consent was obtained to inject left knee joint  Timeout was completed to confirm the site of injection  The medications used were 40 mg of Depo-Medrol and 1% lidocaine 3 cc  Anesthesia was provided by ethyl chloride and the skin was prepped with alcohol.  After cleaning the skin with alcohol a 20-gauge needle was used to inject the left knee joint. There were no complications. A sterile bandage was applied.  FU PRN     9:47 AM Arther Abbott,  MD  04/19/2019

## 2019-04-26 ENCOUNTER — Other Ambulatory Visit: Payer: Self-pay | Admitting: Family Medicine

## 2019-05-02 ENCOUNTER — Encounter: Payer: Self-pay | Admitting: Family Medicine

## 2019-05-02 ENCOUNTER — Ambulatory Visit (INDEPENDENT_AMBULATORY_CARE_PROVIDER_SITE_OTHER): Payer: PPO | Admitting: Family Medicine

## 2019-05-02 ENCOUNTER — Other Ambulatory Visit: Payer: Self-pay

## 2019-05-02 VITALS — BP 108/70 | HR 85 | Temp 98.4°F | Resp 15 | Ht 66.0 in | Wt 172.0 lb

## 2019-05-02 DIAGNOSIS — E6609 Other obesity due to excess calories: Secondary | ICD-10-CM | POA: Diagnosis not present

## 2019-05-02 DIAGNOSIS — I10 Essential (primary) hypertension: Secondary | ICD-10-CM | POA: Diagnosis not present

## 2019-05-02 DIAGNOSIS — M1712 Unilateral primary osteoarthritis, left knee: Secondary | ICD-10-CM

## 2019-05-02 DIAGNOSIS — E785 Hyperlipidemia, unspecified: Secondary | ICD-10-CM

## 2019-05-02 DIAGNOSIS — Z6830 Body mass index (BMI) 30.0-30.9, adult: Secondary | ICD-10-CM

## 2019-05-02 DIAGNOSIS — E1121 Type 2 diabetes mellitus with diabetic nephropathy: Secondary | ICD-10-CM | POA: Diagnosis not present

## 2019-05-02 MED ORDER — UNABLE TO FIND: 0 refills | Status: DC

## 2019-05-02 MED ORDER — NIACIN ER (ANTIHYPERLIPIDEMIC) 500 MG PO TBCR: EXTENDED_RELEASE_TABLET | ORAL | 1 refills | Status: DC

## 2019-05-02 MED ORDER — TRAMADOL HCL 50 MG PO TABS: ORAL_TABLET | ORAL | 2 refills | Status: DC

## 2019-05-02 MED ORDER — GLIPIZIDE ER 5 MG PO TB24: 5.0000 mg | ORAL_TABLET | Freq: Every day | ORAL | 1 refills | Status: DC

## 2019-05-02 MED ORDER — EZETIMIBE 10 MG PO TABS: 10.0000 mg | ORAL_TABLET | Freq: Every day | ORAL | 1 refills | Status: DC

## 2019-05-02 MED ORDER — SPIRONOLACTONE 25 MG PO TABS: 25.0000 mg | ORAL_TABLET | Freq: Every day | ORAL | 1 refills | Status: DC

## 2019-05-02 MED ORDER — FUROSEMIDE 40 MG PO TABS: 40.0000 mg | ORAL_TABLET | Freq: Every morning | ORAL | 5 refills | Status: DC

## 2019-05-02 MED ORDER — POTASSIUM CHLORIDE ER 10 MEQ PO TBCR: EXTENDED_RELEASE_TABLET | ORAL | 5 refills | Status: DC

## 2019-05-02 MED ORDER — FENOFIBRATE 145 MG PO TABS: ORAL_TABLET | ORAL | 5 refills | Status: DC

## 2019-05-02 MED ORDER — LIVALO 2 MG PO TABS: ORAL_TABLET | ORAL | 1 refills | Status: DC

## 2019-05-02 NOTE — Progress Notes (Signed)
[name] [name]     MRN: [id number]      DOB: 08-02-1946   HPI [name] [name] is here for follow up and re-evaluation of chronic medical conditions, medication management and review of any available recent lab and radiology data.  Preventive health is updated, specifically  Cancer screening and Immunization.   Questions or concerns regarding consultations or procedures which the PT has had in the interim are  addressed. The PT denies any adverse reactions to current medications since the last visit.  C/o left knee pain, difficulty getting in tub , wants walker with bench also as often feels as though she may fall. Was in the ED in 03/2019,due to arthritis in this knee and has since been to Ortho on 09/04, when she had injection into the knee Denies polyuria, polydipsia, blurred vision , or hypoglycemic episodes.   ROS Denies recent fever or chills. Denies sinus pressure, nasal congestion, ear pain or sore throat. Denies chest congestion, productive cough or wheezing. Denies chest pains, palpitations and leg swelling Denies abdominal pain, nausea, vomiting,diarrhea or constipation.   Denies dysuria, frequency, hesitancy or incontinence.  Denies headaches, seizures, numbness, or tingling. Denies depression, anxiety or insomnia. Denies skin break down or rash.   PE  BP 108/70   Pulse 85   Temp 98.4 F (36.9 C) (Temporal)   Resp 15   Ht 5\' 6"  (1.676 m)   Wt 172 lb (78 kg)   SpO2 100%   BMI 27.76 kg/m   Patient alert and oriented and in no cardiopulmonary distress.Pt in pain  HEENT: No facial asymmetry, EOMI,   oropharynx pink and moist.  Neck supple no JVD, no mass.  Chest: Clear to auscultation bilaterally.  CVS: S1, S2 no murmurs, no S3.Regular rate.  ABD: Soft non tender.   Ext: No edema  MS: decreased  ROM lumbar spine, adequate in shoulders and hips and markedly reduced in left  Knee which has marked swelling and deformity  Skin: Intact, no ulcerations or rash  noted.  Psych: Good eye contact, normal affect. Memory intact not anxious or depressed appearing.  CNS: CN 2-12 intact, power,  normal throughout.no focal deficits noted.   Assessment & Plan  Osteoarthritis of left knee Progressive pain, reduced mobility, and increased fall risk over past 2 years.Despite recent intraarticular injection still c/o excessive painand fall risk Reports difficulty in getting in shower, as well as taking a bath, will benefit from and needs shower bench C/ohigh fall risk due to pain and knee locking, needs walker with  Bench for safe mobility  Essential hypertension, benign Controlled, no change in medication   Hyperlipidemia LDL goal <100 Hyperlipidemia:Low fat diet discussed and encouraged.   Lipid Panel  Lab Results  Component Value Date   CHOL 162 03/26/2019   HDL 54 03/26/2019   LDLCALC 89 03/26/2019   TRIG 101 03/26/2019   CHOLHDL 3.0 03/26/2019  Controlled, no change in medication      Obesity  Patient re-educated about  the importance of commitment to a  minimum of 150 minutes of exercise per week as able.  The importance of healthy food choices with portion control discussed, as well as eating regularly and within a 12 hour window most days. The need to choose "clean , green" food 50 to 75% of the time is discussed, as well as to make water the primary drink and set a goal of 64 ounces water daily.    Weight /BMI 05/02/2019 04/19/2019 04/02/2019  WEIGHT  172 lb 176 lb 180 lb  HEIGHT 5\' 6"  5\' 6"  5\' 7"   BMI 27.76 kg/m2 28.41 kg/m2 28.19 kg/m2      Type 2 diabetes mellitus with nephropathy ([place]) [name] [name] is reminded of the importance of commitment to daily physical activity for 30 minutes or more, as able and the need to limit carbohydrate intake to 30 to 60 grams per meal to help with blood sugar control.   The need to take medication as prescribed, test blood sugar as directed, and to call between visits if there is a concern  that blood sugar is uncontrolled is also discussed.   [name] [name] is reminded of the importance of daily foot exam, annual eye examination, and good blood sugar, blood pressure and cholesterol control. Controlled, no change in medication   Diabetic Labs Latest Ref Rng & Units 03/26/2019 09/30/2018 07/08/2018 06/27/2018 02/13/2018  HbA1c <5.7 % of total Hgb 7.4(H) - - 7.9(H) 7.4(H)  Microalbumin Not Estab. ug/mL - - - - -  Micro/Creat Ratio 0.0 - 30.0 mg/g creat - - - - -  Chol <200 mg/dL 162 - - - 164  HDL > OR = 50 mg/dL 54 - - - 59  Calc LDL mg/dL (calc) 89 - - - 88  Triglycerides <150 mg/dL 101 - - - 84  Creatinine 0.60 - 0.93 mg/dL 1.98(H) 2.07(H) 1.71(H) 2.28(H) 1.81(H)   BP/Weight 05/02/2019 04/19/2019 04/02/2019 03/13/2019 02/06/2019 01/30/2019 [id number]  Systolic BP 123XX123 123456 [id number] - 123456 - -  Diastolic BP 70 67 74 - 70 - -  Wt. (Lbs) 172 176 180 186 186 186 186  BMI 27.76 28.41 28.19 30.02 30.02 30.02 30.02   Foot/eye exam completion dates Latest Ref Rng & Units 07/31/2017 05/31/2016  Eye Exam No Retinopathy - No Retinopathy  Foot exam Order - - -  Foot Form Completion - Done -

## 2019-05-02 NOTE — Patient Instructions (Addendum)
Annual exam in office with MD in early December, call if you need me before   Return for flu vaccine per nurse  We will request shower bench and walker with bench   Blood pressure and blood sugar  Carefull not to fall  ,Thanks for choosing [facility], we consider it a privelige to serve you.

## 2019-05-03 ENCOUNTER — Other Ambulatory Visit: Payer: Self-pay | Admitting: Cardiology

## 2019-05-05 ENCOUNTER — Encounter: Payer: Self-pay | Admitting: Family Medicine

## 2019-05-05 NOTE — Assessment & Plan Note (Signed)
Hyperlipidemia:Low fat diet discussed and encouraged.   Lipid Panel  Lab Results  Component Value Date   CHOL 162 03/26/2019   HDL 54 03/26/2019   LDLCALC 89 03/26/2019   TRIG 101 03/26/2019   CHOLHDL 3.0 03/26/2019  Controlled, no change in medication

## 2019-05-05 NOTE — Assessment & Plan Note (Signed)
[name] [name] is reminded of the importance of commitment to daily physical activity for 30 minutes or more, as able and the need to limit carbohydrate intake to 30 to 60 grams per meal to help with blood sugar control.   The need to take medication as prescribed, test blood sugar as directed, and to call between visits if there is a concern that blood sugar is uncontrolled is also discussed.   [name] [name] is reminded of the importance of daily foot exam, annual eye examination, and good blood sugar, blood pressure and cholesterol control. Controlled, no change in medication   Diabetic Labs Latest Ref Rng & Units 03/26/2019 09/30/2018 07/08/2018 06/27/2018 02/13/2018  HbA1c <5.7 % of total Hgb 7.4(H) - - 7.9(H) 7.4(H)  Microalbumin Not Estab. ug/mL - - - - -  Micro/Creat Ratio 0.0 - 30.0 mg/g creat - - - - -  Chol <200 mg/dL 162 - - - 164  HDL > OR = 50 mg/dL 54 - - - 59  Calc LDL mg/dL (calc) 89 - - - 88  Triglycerides <150 mg/dL 101 - - - 84  Creatinine 0.60 - 0.93 mg/dL 1.98(H) 2.07(H) 1.71(H) 2.28(H) 1.81(H)   BP/Weight 05/02/2019 04/19/2019 04/02/2019 03/13/2019 02/06/2019 01/30/2019 [id number]  Systolic BP 123XX123 123456 [id number] - 123456 - -  Diastolic BP 70 67 74 - 70 - -  Wt. (Lbs) 172 176 180 186 186 186 186  BMI 27.76 28.41 28.19 30.02 30.02 30.02 30.02   Foot/eye exam completion dates Latest Ref Rng & Units 07/31/2017 05/31/2016  Eye Exam No Retinopathy - No Retinopathy  Foot exam Order - - -  Foot Form Completion - Done -

## 2019-05-05 NOTE — Assessment & Plan Note (Signed)
Progressive pain, reduced mobility, and increased fall risk over past 2 years.Despite recent intraarticular injection still c/o excessive painand fall risk Reports difficulty in getting in shower, as well as taking a bath, will benefit from and needs shower bench C/ohigh fall risk due to pain and knee locking, needs walker with  Bench for safe mobility

## 2019-05-05 NOTE — Assessment & Plan Note (Signed)
  Patient re-educated about  the importance of commitment to a  minimum of 150 minutes of exercise per week as able.  The importance of healthy food choices with portion control discussed, as well as eating regularly and within a 12 hour window most days. The need to choose "clean , green" food 50 to 75% of the time is discussed, as well as to make water the primary drink and set a goal of 64 ounces water daily.    Weight /BMI 05/02/2019 04/19/2019 04/02/2019  WEIGHT 172 lb 176 lb 180 lb  HEIGHT 5\' 6"  5\' 6"  5\' 7"   BMI 27.76 kg/m2 28.41 kg/m2 28.19 kg/m2

## 2019-05-05 NOTE — Assessment & Plan Note (Signed)
Controlled, no change in medication  

## 2019-05-07 ENCOUNTER — Telehealth: Payer: Self-pay | Admitting: Family Medicine

## 2019-05-07 DIAGNOSIS — I129 Hypertensive chronic kidney disease with stage 1 through stage 4 chronic kidney disease, or unspecified chronic kidney disease: Secondary | ICD-10-CM

## 2019-05-07 DIAGNOSIS — E1122 Type 2 diabetes mellitus with diabetic chronic kidney disease: Secondary | ICD-10-CM

## 2019-05-07 MED ORDER — BLOOD GLUCOSE METER KIT: PACK | 0 refills | Status: DC

## 2019-05-07 NOTE — Telephone Encounter (Signed)
Prescription for new glucometer sent to [facility]

## 2019-05-07 NOTE — Telephone Encounter (Signed)
Please resend Prescription For Ultra One Touch, Hers is not working

## 2019-05-13 ENCOUNTER — Other Ambulatory Visit: Payer: Self-pay

## 2019-05-13 ENCOUNTER — Other Ambulatory Visit: Payer: Self-pay | Admitting: Family Medicine

## 2019-05-13 ENCOUNTER — Ambulatory Visit (INDEPENDENT_AMBULATORY_CARE_PROVIDER_SITE_OTHER): Payer: PPO

## 2019-05-13 DIAGNOSIS — Z23 Encounter for immunization: Secondary | ICD-10-CM | POA: Diagnosis not present

## 2019-05-15 DIAGNOSIS — M1712 Unilateral primary osteoarthritis, left knee: Secondary | ICD-10-CM | POA: Diagnosis not present

## 2019-07-18 ENCOUNTER — Other Ambulatory Visit: Payer: Self-pay

## 2019-07-18 ENCOUNTER — Encounter: Payer: Self-pay | Admitting: Family Medicine

## 2019-07-18 ENCOUNTER — Ambulatory Visit (INDEPENDENT_AMBULATORY_CARE_PROVIDER_SITE_OTHER): Payer: PPO | Admitting: Family Medicine

## 2019-07-18 ENCOUNTER — Encounter: Payer: PPO | Admitting: Family Medicine

## 2019-07-18 VITALS — BP 108/70 | Ht 66.0 in | Wt 172.0 lb

## 2019-07-18 DIAGNOSIS — E663 Overweight: Secondary | ICD-10-CM

## 2019-07-18 DIAGNOSIS — M159 Polyosteoarthritis, unspecified: Secondary | ICD-10-CM | POA: Diagnosis not present

## 2019-07-18 DIAGNOSIS — I5042 Chronic combined systolic (congestive) and diastolic (congestive) heart failure: Secondary | ICD-10-CM

## 2019-07-18 DIAGNOSIS — I10 Essential (primary) hypertension: Secondary | ICD-10-CM | POA: Diagnosis not present

## 2019-07-18 DIAGNOSIS — E1121 Type 2 diabetes mellitus with diabetic nephropathy: Secondary | ICD-10-CM | POA: Diagnosis not present

## 2019-07-18 MED ORDER — GLIPIZIDE ER 5 MG PO TB24: 5.0000 mg | ORAL_TABLET | Freq: Every day | ORAL | 1 refills | Status: DC

## 2019-07-18 MED ORDER — LITETOUCH PEN NEEDLES 31G X 8 MM MISC: 5 refills | Status: DC

## 2019-07-18 MED ORDER — SPIRONOLACTONE 25 MG PO TABS: 25.0000 mg | ORAL_TABLET | Freq: Every day | ORAL | 1 refills | Status: DC

## 2019-07-18 MED ORDER — PREDNISONE 10 MG PO TABS: 10.0000 mg | ORAL_TABLET | Freq: Two times a day (BID) | ORAL | 0 refills | Status: DC

## 2019-07-18 MED ORDER — ACETAMINOPHEN 500 MG PO TABS: ORAL_TABLET | ORAL | 3 refills | Status: AC

## 2019-07-18 NOTE — Progress Notes (Signed)
Virtual Visit via Telephone Note  I connected with [name] [name] on 07/18/19 at  9:20 AM EST by telephone and verified that I am speaking with the correct person using two identifiers.  Location: Patient: home Provider: office   I discussed the limitations, risks, security and privacy concerns of performing an evaluation and management service by telephone and the availability of in person appointments. I also discussed with the patient that there may be a patient responsible charge related to this service. The patient expressed understanding and agreed to proceed.   History of Present Illness: F/u chronic problems , medication review and update cancer screening C/o increased generalized joint pains with temperature change as expected. Denies falls Denies polyuria, polydipsia, blurred vision , or hypoglycemic episodes. Denies recent fever or chills. Denies sinus pressure, nasal congestion, ear pain or sore throat. Denies chest congestion, productive cough or wheezing. Denies chest pains, palpitations and leg swelling Denies abdominal pain, nausea, vomiting,diarrhea or constipation.   Denies dysuria, frequency, hesitancy or incontinence. . Denies headaches, seizures, numbness, or tingling. Denies depression, anxiety or insomnia. Denies skin break down or rash.       Observations/Objective: BP 108/70   Ht 5\' 6"  (1.676 m)   Wt 172 lb (78 kg)   BMI 27.76 kg/m  Good communication with no confusion and intact memory. Alert and oriented x 3 No signs of respiratory distress during speech    Assessment and Plan:  Generalized osteoarthritis Current flare, short course of prednisone, start daily tylenol, and tramadol only for severe pain. Regular use of walker encouraged to reduce falls, also home safety discussed  Essential hypertension, benign Controlled, no change in medication   Type 2 diabetes mellitus with nephropathy ([place]) [name] [name] is reminded of the importance  of commitment to daily physical activity for 30 minutes or more, as able and the need to limit carbohydrate intake to 30 to 60 grams per meal to help with blood sugar control.   The need to take medication as prescribed, test blood sugar as directed, and to call between visits if there is a concern that blood sugar is uncontrolled is also discussed.   [name] [name] is reminded of the importance of daily foot exam, annual eye examination, and good blood sugar, blood pressure and cholesterol control.  Diabetic Labs Latest Ref Rng & Units 03/26/2019 09/30/2018 07/08/2018 06/27/2018 02/13/2018  HbA1c <5.7 % of total Hgb 7.4(H) - - 7.9(H) 7.4(H)  Microalbumin Not Estab. ug/mL - - - - -  Micro/Creat Ratio 0.0 - 30.0 mg/g creat - - - - -  Chol <200 mg/dL 162 - - - 164  HDL > OR = 50 mg/dL 54 - - - 59  Calc LDL mg/dL (calc) 89 - - - 88  Triglycerides <150 mg/dL 101 - - - 84  Creatinine 0.60 - 0.93 mg/dL 1.98(H) 2.07(H) 1.71(H) 2.28(H) 1.81(H)   BP/Weight 07/18/2019 05/02/2019 04/19/2019 04/02/2019 03/13/2019 02/06/2019 0000000  Systolic BP 123XX123 123XX123 123456 [id number] - 123456 -  Diastolic BP 70 70 67 74 - 70 -  Wt. (Lbs) 172 172 176 180 186 186 186  BMI 27.76 27.76 28.41 28.19 30.02 30.02 30.02   Foot/eye exam completion dates Latest Ref Rng & Units 07/31/2017 05/31/2016  Eye Exam No Retinopathy - No Retinopathy  Foot exam Order - - -  Foot Form Completion - Done -      Controlled, no change in medication Updated lab needed at/ before next visit.   Chronic combined systolic and diastolic  CHF (congestive heart failure) (HCC) Currently stable, no symptoms of heart failure repotted, continue salt and fluid restriction  Overweight (BMI 25.0-29.9)  Patient re-educated about  the importance of commitment to aregular exercise as able. The importance of healthy food choices with portion control discussed   Weight /BMI 07/18/2019 05/02/2019 04/19/2019  WEIGHT 172 lb 172 lb 176 lb  HEIGHT 5\' 6"  5\' 6"  5\' 6"   BMI 27.76  kg/m2 27.76 kg/m2 28.41 kg/m2       Follow Up Instructions:    I discussed the assessment and treatment plan with the patient. The patient was provided an opportunity to ask questions and all were answered. The patient agreed with the plan and demonstrated an understanding of the instructions.   The patient was advised to call back or seek an in-person evaluation if the symptoms worsen or if the condition fails to improve as anticipated.  I provided 18 minutes of non-face-to-face time during this encounter.   [name], MD

## 2019-07-18 NOTE — Patient Instructions (Signed)
Annual exam as before, call if you need me sooner  Please reschedule your mammogram as we discussed  For arthritis and knee pain, prednisone is prescribed for 5 days only Tylenol one two times daily is recommended. Tramadol one at bedtime if pain is uncontrolled , should be taken  Please use walker to protect yourself from falling  Thanks for choosing [facility], we consider it a privelige to serve you.

## 2019-07-26 DIAGNOSIS — M159 Polyosteoarthritis, unspecified: Secondary | ICD-10-CM | POA: Insufficient documentation

## 2019-07-26 NOTE — Assessment & Plan Note (Signed)
Ms. Nevins is reminded of the importance of commitment to daily physical activity for 30 minutes or more, as able and the need to limit carbohydrate intake to 30 to 60 grams per meal to help with blood sugar control.   The need to take medication as prescribed, test blood sugar as directed, and to call between visits if there is a concern that blood sugar is uncontrolled is also discussed.   Ms. Anzivino is reminded of the importance of daily foot exam, annual eye examination, and good blood sugar, blood pressure and cholesterol control.  Diabetic Labs Latest Ref Rng & Units 03/26/2019 09/30/2018 07/08/2018 06/27/2018 02/13/2018  HbA1c <5.7 % of total Hgb 7.4(H) - - 7.9(H) 7.4(H)  Microalbumin Not Estab. ug/mL - - - - -  Micro/Creat Ratio 0.0 - 30.0 mg/g creat - - - - -  Chol <200 mg/dL 162 - - - 164  HDL > OR = 50 mg/dL 54 - - - 59  Calc LDL mg/dL (calc) 89 - - - 88  Triglycerides <150 mg/dL 101 - - - 84  Creatinine 0.60 - 0.93 mg/dL 1.98(H) 2.07(H) 1.71(H) 2.28(H) 1.81(H)   BP/Weight 07/18/2019 05/02/2019 04/19/2019 04/02/2019 03/13/2019 02/06/2019 0000000  Systolic BP 123XX123 123XX123 123456 123XX123 - 123456 -  Diastolic BP 70 70 67 74 - 70 -  Wt. (Lbs) 172 172 176 180 186 186 186  BMI 27.76 27.76 28.41 28.19 30.02 30.02 30.02   Foot/eye exam completion dates Latest Ref Rng & Units 07/31/2017 05/31/2016  Eye Exam No Retinopathy - No Retinopathy  Foot exam Order - - -  Foot Form Completion - Done -      Controlled, no change in medication Updated lab needed at/ before next visit.

## 2019-07-26 NOTE — Assessment & Plan Note (Signed)
Controlled, no change in medication  

## 2019-07-26 NOTE — Assessment & Plan Note (Signed)
Currently stable, no symptoms of heart failure repotted, continue salt and fluid restriction

## 2019-07-26 NOTE — Assessment & Plan Note (Signed)
Current flare, short course of prednisone, start daily tylenol, and tramadol only for severe pain. Regular use of walker encouraged to reduce falls, also home safety discussed

## 2019-07-26 NOTE — Assessment & Plan Note (Signed)
  Patient re-educated about  the importance of commitment to aregular exercise as able. The importance of healthy food choices with portion control discussed   Weight /BMI 07/18/2019 05/02/2019 04/19/2019  WEIGHT 172 lb 172 lb 176 lb  HEIGHT 5\' 6"  5\' 6"  5\' 6"   BMI 27.76 kg/m2 27.76 kg/m2 28.41 kg/m2

## 2019-08-02 ENCOUNTER — Other Ambulatory Visit: Payer: Self-pay | Admitting: Family Medicine

## 2019-08-14 ENCOUNTER — Other Ambulatory Visit: Payer: Self-pay | Admitting: Family Medicine

## 2019-08-19 ENCOUNTER — Telehealth: Payer: Self-pay

## 2019-08-19 ENCOUNTER — Telehealth: Payer: Self-pay | Admitting: *Deleted

## 2019-08-19 DIAGNOSIS — E1121 Type 2 diabetes mellitus with diabetic nephropathy: Secondary | ICD-10-CM

## 2019-08-19 DIAGNOSIS — I10 Essential (primary) hypertension: Secondary | ICD-10-CM

## 2019-08-19 DIAGNOSIS — E1122 Type 2 diabetes mellitus with diabetic chronic kidney disease: Secondary | ICD-10-CM

## 2019-08-19 DIAGNOSIS — I129 Hypertensive chronic kidney disease with stage 1 through stage 4 chronic kidney disease, or unspecified chronic kidney disease: Secondary | ICD-10-CM

## 2019-08-19 DIAGNOSIS — N184 Chronic kidney disease, stage 4 (severe): Secondary | ICD-10-CM

## 2019-08-19 NOTE — Telephone Encounter (Signed)
Pt wanted to know if she needed lab work before her appt on 08-22-19 would like a call back

## 2019-08-19 NOTE — Telephone Encounter (Signed)
Patient aware that she has to do fasting labs

## 2019-08-19 NOTE — Telephone Encounter (Signed)
Labs ordered again from July and pt aware

## 2019-08-20 DIAGNOSIS — I129 Hypertensive chronic kidney disease with stage 1 through stage 4 chronic kidney disease, or unspecified chronic kidney disease: Secondary | ICD-10-CM | POA: Diagnosis not present

## 2019-08-20 DIAGNOSIS — E1122 Type 2 diabetes mellitus with diabetic chronic kidney disease: Secondary | ICD-10-CM | POA: Diagnosis not present

## 2019-08-20 DIAGNOSIS — N184 Chronic kidney disease, stage 4 (severe): Secondary | ICD-10-CM | POA: Diagnosis not present

## 2019-08-21 LAB — COMPLETE METABOLIC PANEL WITH GFR
AG Ratio: 1.6 (calc) (ref 1.0–2.5)
ALT: 14 U/L (ref 6–29)
AST: 20 U/L (ref 10–35)
Albumin: 4.1 g/dL (ref 3.6–5.1)
Alkaline phosphatase (APISO): 27 U/L — ABNORMAL LOW (ref 37–153)
BUN/Creatinine Ratio: 18 (calc) (ref 6–22)
BUN: 32 mg/dL — ABNORMAL HIGH (ref 7–25)
CO2: 28 mmol/L (ref 20–32)
Calcium: 9.8 mg/dL (ref 8.6–10.4)
Chloride: 106 mmol/L (ref 98–110)
Creat: 1.81 mg/dL — ABNORMAL HIGH (ref 0.60–0.93)
GFR, Est African American: 31 mL/min/{1.73_m2} — ABNORMAL LOW (ref >=60)
GFR, Est Non African American: 27 mL/min/{1.73_m2} — ABNORMAL LOW (ref >=60)
Globulin: 2.5 g/dL (calc) (ref 1.9–3.7)
Glucose, Bld: 170 mg/dL — ABNORMAL HIGH (ref 65–99)
Potassium: 4.4 mmol/L (ref 3.5–5.3)
Sodium: 142 mmol/L (ref 135–146)
Total Bilirubin: 0.5 mg/dL (ref 0.2–1.2)
Total Protein: 6.6 g/dL (ref 6.1–8.1)

## 2019-08-21 LAB — HEMOGLOBIN A1C
Hgb A1c MFr Bld: 7.6 % of total Hgb — ABNORMAL HIGH (ref <5.7)
Mean Plasma Glucose: 171 (calc)
eAG (mmol/L): 9.5 (calc)

## 2019-08-21 LAB — CBC
HCT: 39.3 % (ref 35.0–45.0)
Hemoglobin: 12.5 g/dL (ref 11.7–15.5)
MCH: 27.1 pg (ref 27.0–33.0)
MCHC: 31.8 g/dL — ABNORMAL LOW (ref 32.0–36.0)
MCV: 85.1 fL (ref 80.0–100.0)
MPV: 11.4 fL (ref 7.5–12.5)
Platelets: 157 10*3/uL (ref 140–400)
RBC: 4.62 10*6/uL (ref 3.80–5.10)
RDW: 15.3 % — ABNORMAL HIGH (ref 11.0–15.0)
WBC: 4 10*3/uL (ref 3.8–10.8)

## 2019-08-21 LAB — LIPID PANEL
Cholesterol: 168 mg/dL (ref <200)
HDL: 58 mg/dL (ref >=50)
LDL Cholesterol (Calc): 94 mg/dL (calc)
Non-HDL Cholesterol (Calc): 110 mg/dL (calc) (ref <130)
Total CHOL/HDL Ratio: 2.9 (calc) (ref <5.0)
Triglycerides: 72 mg/dL (ref <150)

## 2019-08-21 LAB — MICROALBUMIN / CREATININE URINE RATIO
Creatinine, Urine: 167 mg/dL (ref 20–275)
Microalb Creat Ratio: 9 mcg/mg creat (ref <30)
Microalb, Ur: 1.5 mg/dL

## 2019-08-22 ENCOUNTER — Encounter (INDEPENDENT_AMBULATORY_CARE_PROVIDER_SITE_OTHER): Payer: Self-pay

## 2019-08-22 ENCOUNTER — Ambulatory Visit (INDEPENDENT_AMBULATORY_CARE_PROVIDER_SITE_OTHER): Payer: PPO | Admitting: Family Medicine

## 2019-08-22 ENCOUNTER — Encounter: Payer: Self-pay | Admitting: Family Medicine

## 2019-08-22 ENCOUNTER — Other Ambulatory Visit: Payer: Self-pay

## 2019-08-22 VITALS — BP 114/74 | HR 74 | Temp 97.7°F | Resp 15 | Ht 66.0 in | Wt 174.0 lb

## 2019-08-22 DIAGNOSIS — Z780 Asymptomatic menopausal state: Secondary | ICD-10-CM

## 2019-08-22 DIAGNOSIS — I10 Essential (primary) hypertension: Secondary | ICD-10-CM | POA: Diagnosis not present

## 2019-08-22 DIAGNOSIS — Z0000 Encounter for general adult medical examination without abnormal findings: Secondary | ICD-10-CM

## 2019-08-22 DIAGNOSIS — E1121 Type 2 diabetes mellitus with diabetic nephropathy: Secondary | ICD-10-CM

## 2019-08-22 DIAGNOSIS — Z952 Presence of prosthetic heart valve: Secondary | ICD-10-CM

## 2019-08-22 NOTE — Patient Instructions (Addendum)
F/U in office with MD in end  May call if you need me sooner   Please schedule mammogram at checkout for 9 am  Or after , in next 2 weeks or more, also schedule bone density around the same time, pls explain to pt I also ordered the bone density test to screen for thin bones as this is overdue  No medciation change, reduce sweets and starchy foods please  Cardiology will call you with your follow up appt, I entered the referral  Non fasting HBA1C. Chem 7 and eGFr for May visit  Thanks for choosing [facility], we consider it a privelige to serve you.   Topical rubs and tylenol for knees and use the cane   Best for 2021!

## 2019-08-22 NOTE — Assessment & Plan Note (Signed)
foloow up past due

## 2019-08-22 NOTE — Progress Notes (Signed)
[name] [name]     MRN: [id number]      DOB: [birth year]  HPI: Patient is in for annual physical exam. C/o bi;lateral knee pain and stiffness, has had no falls and uses her cane Recent labs, if available are reviewed. Needs mammogram rescheduled Requests cardiology apptImmunization is reviewed , and  updated if needed.   PE: BP 114/74   Pulse 74   Temp 97.7 F (36.5 C) (Temporal)   Resp 15   Ht 5\' 6"  (1.676 m)   Wt 174 lb (78.9 kg)   SpO2 98%   BMI 28.08 kg/m   Pleasant  female, alert and oriented x 3, in no cardio-pulmonary distress. Afebrile. HEENT No facial trauma or asymetry. Sinuses non tender.  Extra occullar muscles intact.. External ears normal, . Neck: supple, no adenopathy,JVD or thyromegaly.No bruits.  Chest: Clear to ascultation bilaterally.No crackles or wheezes. Non tender to palpation  Breast: No asymetry,no masses or lumps. No tenderness. No nipple discharge or inversion. No axillary or supraclavicular adenopathy  Cardiovascular system; Heart sounds S1 and S2, no S3, systolic murmur no thrill Peripheral pulses normal.  Abdomen: Soft, non tender, no organomegaly or masses. No bruits. Bowel sounds normal. No guarding, tenderness or rebound.     Musculoskeletal exam: Full ROM of spine, hips , shoulders an reduced in  knees. Mild  deformity ,swelling or crepitus noted. No muscle wasting or atrophy.   Neurologic: Cranial nerves 2 to 12 intact. Power, tone ,sensation and reflexes normal throughout.  disturbance in gait. No tremor.  Skin: Intact, no ulceration, erythema , scaling or rash noted. Pigmentation normal throughout  Psych; Normal mood and affect. Judgement and concentration normal   Assessment & Plan:  S/P TAVR (transcatheter aortic valve replacement) foloow up past due  Annual physical exam Annual exam as documented. Counseling done  re healthy lifestyle involving commitment to 150 minutes exercise per week, heart  healthy diet, and attaining healthy weight.The importance of adequate sleep also discussed. Regular seat belt use and home safety, is also discussed. Changes in health habits are decided on by the patient with goals and time frames  set for achieving them. Immunization and cancer screening needs are specifically addressed at this visit.   Type 2 diabetes mellitus with nephropathy (HCC) Controlled, no change in medication [name] [name] is reminded of the importance of commitment to daily physical activity for 30 minutes or more, as able and the need to limit carbohydrate intake to 30 to 60 grams per meal to help with blood sugar control.   The need to take medication as prescribed, test blood sugar as directed, and to call between visits if there is a concern that blood sugar is uncontrolled is also discussed.   [name] [name] is reminded of the importance of daily foot exam, annual eye examination, and good blood sugar, blood pressure and cholesterol control.  Diabetic Labs Latest Ref Rng & Units 08/20/2019 03/26/2019 09/30/2018 07/08/2018 06/27/2018  HbA1c <5.7 % of total Hgb 7.6(H) 7.4(H) - - 7.9(H)  Microalbumin mg/dL 1.5 - - - -  Micro/Creat Ratio <30 mcg/mg creat 9 - - - -  Chol <200 mg/dL 168 162 - - -  HDL > OR = 50 mg/dL 58 54 - - -  Calc LDL mg/dL (calc) 94 89 - - -  Triglycerides <150 mg/dL 72 101 - - -  Creatinine 0.60 - 0.93 mg/dL 1.81(H) 1.98(H) 2.07(H) 1.71(H) 2.28(H)   BP/Weight 08/22/2019 07/18/2019 05/02/2019 04/19/2019 04/02/2019 03/13/2019 0000000  Systolic  BP 114 108 108 119 [id number] - [id number]  Diastolic BP 74 70 70 67 74 - 70  Wt. (Lbs) 174 172 172 176 180 186 186  BMI 28.08 27.76 27.76 28.41 28.19 30.02 30.02   Foot/eye exam completion dates Latest Ref Rng & Units 08/22/2019 07/31/2017  Eye Exam No Retinopathy - -  Foot exam Order - - -  Foot Form Completion - Done Done

## 2019-08-22 NOTE — Assessment & Plan Note (Signed)

## 2019-08-22 NOTE — Assessment & Plan Note (Signed)
Controlled, no change in medication [name] [name] is reminded of the importance of commitment to daily physical activity for 30 minutes or more, as able and the need to limit carbohydrate intake to 30 to 60 grams per meal to help with blood sugar control.   The need to take medication as prescribed, test blood sugar as directed, and to call between visits if there is a concern that blood sugar is uncontrolled is also discussed.   [name] [name] is reminded of the importance of daily foot exam, annual eye examination, and good blood sugar, blood pressure and cholesterol control.  Diabetic Labs Latest Ref Rng & Units 08/20/2019 03/26/2019 09/30/2018 07/08/2018 06/27/2018  HbA1c <5.7 % of total Hgb 7.6(H) 7.4(H) - - 7.9(H)  Microalbumin mg/dL 1.5 - - - -  Micro/Creat Ratio <30 mcg/mg creat 9 - - - -  Chol <200 mg/dL 168 162 - - -  HDL > OR = 50 mg/dL 58 54 - - -  Calc LDL mg/dL (calc) 94 89 - - -  Triglycerides <150 mg/dL 72 101 - - -  Creatinine 0.60 - 0.93 mg/dL 1.81(H) 1.98(H) 2.07(H) 1.71(H) 2.28(H)   BP/Weight 08/22/2019 07/18/2019 05/02/2019 04/19/2019 04/02/2019 03/13/2019 0000000  Systolic BP 99991111 123XX123 123XX123 123456 [id number] - 123456  Diastolic BP 74 70 70 67 74 - 70  Wt. (Lbs) 174 172 172 176 180 186 186  BMI 28.08 27.76 27.76 28.41 28.19 30.02 30.02   Foot/eye exam completion dates Latest Ref Rng & Units 08/22/2019 07/31/2017  Eye Exam No Retinopathy - -  Foot exam Order - - -  Foot Form Completion - Done Done

## 2019-08-27 ENCOUNTER — Other Ambulatory Visit: Payer: Self-pay | Admitting: Family Medicine

## 2019-08-29 ENCOUNTER — Encounter: Payer: Self-pay | Admitting: Physician Assistant

## 2019-08-29 NOTE — Progress Notes (Signed)
Cardiology Office Note    Date:  09/02/2019   ID:  [name], [name] 13-Sep-1945, MRN [id number]  PCP:  Fayrene Helper, MD  Cardiologist:  Rozann Lesches, MD  Electrophysiologist:  None   Chief Complaint: f/u AS, CHF  History of Present Illness:   [name] [name] is a 74 y.o. 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  She has a history of prior cath in 05/2014 showing no significant CAD, done as part of her workup for TAVR. She underwent TAVR in August 2016 and has done well since then, although is noted to have significant LV dysfunction. 2D echo 04/2018 showed moderate LVH, EF 15-20%, diffuse HK, grade 1 DD, s/p TAVR without AS/AI, mild MR, mildly increased PASP. Dr. [name] has not initiated ACEI/ARB/ARNI due to CKD. Notes indicate patient previously declined to consider BiV+/-ICD. Outside labs personally reviewed include:08/20/19  K 4.4, Cr 1.81, BUN 32, AST/ALT wnl, Hgb 12.5, LDL 94 (followed by primary care), A1C 7.6.  She returns for follow-up today. Overall she reports no interim changes in cardiac status since last OV. She lives with her 71 year old grandson who helps around the house. She still does quite a bit for herself though. She has to take it slow because of bilateral knee problems causing pain and issues with mobility. She has NYHA class II dyspnea. No edema, orthopnea, PND, syncope, palpitations or chest pain.  Past Medical History:  Diagnosis Date  . Anxiety   . Aortic stenosis   . Arthritis   . Chronic combined systolic and diastolic CHF (congestive heart failure) ([place])   . CKD (chronic kidney disease) stage 3, GFR 30-59 ml/min   . Constipation   . Depression   . Diabetes mellitus, type 2 ([place])   . Essential hypertension, benign   . Hyperlipidemia   .  Incidental pulmonary nodule, > 63m and < 829m   7 x 4 mm LLL nodule  . LBBB (left bundle branch block)   . Nonischemic cardiomyopathy (HCC)    No significant obstructive CAD at heart catheterization 05/2014, LVEF 20%  . Obesity   . Pneumonia 2013  . 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 Symptomatic PVCs     Past Surgical History:  Procedure Laterality Date  . CARDIAC CATHETERIZATION    . CESAREAN SECTION  1987  . FLEXIBLE BRONCHOSCOPY N/A 01/14/2015   Procedure: FLEXIBLE BRONCHOSCOPY;  Surgeon: StMelrose NakayamaMD;  [place] [facility] [place];  Laterality: N/A;  . LEFT AND RIGHT HEART CATHETERIZATION WITH CORONARY ANGIOGRAM N/A 12/05/2011   Procedure: LEFT AND RIGHT HEART CATHETERIZATION WITH CORONARY ANGIOGRAM;  Surgeon: ChBurnell BlanksMD;  [facility];  Service: Cardiovascular;  Laterality: N/A;  . LEFT AND RIGHT HEART CATHETERIZATION WITH CORONARY ANGIOGRAM N/A 05/23/2014   Procedure: LEFT AND RIGHT HEART CATHETERIZATION WITH CORONARY ANGIOGRAM;  Surgeon: ChBurnell BlanksMD;  [facility];  Service: Cardiovascular;  Laterality: N/A;  . MULTIPLE EXTRACTIONS WITH ALVEOLOPLASTY N/A 02/27/2014   Procedure: Extraction of tooth #'s 2,4,5,6,7,8,9,10,11,12, 22, 23, 24, 25, 26, 28 with alveoloplasty and bilateral mandibular tori reductions;  Surgeon: RoLenn CalDDS;  [facility] Service: Oral Surgery;  Laterality: N/A;  .  RIGID BRONCHOSCOPY N/A 01/14/2015   Procedure: RIGID BRONCHOSCOPY with Removal Of Foreign Body ;  Surgeon: [name], MD;  Location: [place];  Service: Thoracic;  Laterality: N/A;  . TEE WITHOUT CARDIOVERSION N/A 03/24/2015   Procedure: TRANSESOPHAGEAL ECHOCARDIOGRAM (TEE);  Surgeon: [name], MD;  Location: [place];  Service: Open Heart Surgery;  Laterality: N/A;  . TRANSCATHETER  AORTIC VALVE REPLACEMENT, TRANSFEMORAL Bilateral 03/24/2015   Procedure: TRANSCATHETER AORTIC VALVE REPLACEMENT, TRANSFEMORAL;  Surgeon: [name], MD;  Location: [place];  Service: Open Heart Surgery;  Laterality: Bilateral;  . TUBAL LIGATION  1987  . VIDEO BRONCHOSCOPY Bilateral 06/05/2014   Procedure: VIDEO BRONCHOSCOPY WITHOUT FLUORO;  Surgeon: [name], MD;  [facility];  Service: Cardiopulmonary;  Laterality: Bilateral;    Current Medications: Current Meds  Medication Sig  . acetaminophen (TYLENOL) 500 MG tablet Take one tablet two times daily, by mouth, for knee  pain  . aspirin EC 81 MG tablet Take 81 mg by mouth every morning.   . blood glucose meter kit and supplies Dispense based on patient and insurance preference. Use to test three times daily (FOR ICD-10 E10.9, E11.9).  . carvedilol (COREG) 25 MG tablet TAKE 1 TABLET BY MOUTH TWICE DAILY WITH MEALS.  Marland Kitchen ezetimibe (ZETIA) 10 MG tablet Take 1 tablet (10 mg total) by mouth daily.  . fenofibrate (TRICOR) 145 MG tablet TAKE (1) TABLET BY MOUTH ONCE DAILY.  . fluticasone (FLONASE) 50 MCG/ACT nasal spray Place 2 sprays into both nostrils daily. (Patient taking differently: Place 2 sprays into both nostrils daily as needed for allergies or rhinitis. )  . furosemide (LASIX) 40 MG tablet Take 1 tablet (40 mg total) by mouth every morning.  Marland Kitchen glipiZIDE (GLIPIZIDE XL) 5 MG 24 hr tablet Take 1 tablet (5 mg total) by mouth daily with breakfast.  . Insulin Pen Needle (LITETOUCH PEN NEEDLES) 31G X 8 MM MISC USE AS NEEDED TO INJECT INSULIN.  Marland Kitchen Lancets (ONETOUCH DELICA PLUS MMHWKG88P) MISC USE 3 TIMES A DAY AS DIRECTED.  Marland Kitchen lidocaine-prilocaine (EMLA) cream Apply 1-2 application topically 3 (three) times daily as needed.  Marland Kitchen LIVALO 2 MG TABS TAKE ONE TABLET BY MOUTH ONCE DAILY AFTER SUPPER.  . methocarbamol (ROBAXIN) 500 MG tablet Take 1 tablet (500 mg total) by mouth every 6 (six) hours as needed for muscle spasms (chest  pain).  . Multiple Vitamins-Minerals (MULTIVITAMIN PO) Take 1 tablet by mouth every morning.   . niacin (NIASPAN) 500 MG CR tablet TAKE (1) TABLET BY MOUTH AT BEDTIME.  Marland Kitchen NOVOLOG MIX 70/30 FLEXPEN (70-30) 100 UNIT/ML FlexPen INJECT 12 TO 15 UNITS SUBCUTANEOUSLY TWICE DAILY.  Marland Kitchen ONETOUCH ULTRA test strip CHECK BLOOD SUGAR 3 TIMES A DAY.  Marland Kitchen potassium chloride (K-DUR) 10 MEQ tablet TAKE ONE TABLET BY MOUTH DAILY. DO NOT TAKE IF NOT TAKING FUROSEMIDE.  Marland Kitchen predniSONE (DELTASONE) 10 MG tablet Take 1 tablet (10 mg total) by mouth 2 (two) times daily with a meal. (she has at home but has not really taken much of the rx)  . spironolactone (ALDACTONE) 25 MG tablet Take 1 tablet (25 mg total) by mouth daily.  . traMADol (ULTRAM) 50 MG tablet Take one tablet by mouth at bedtime , as needed, for knee pain  . UNABLE TO FIND Shower bench x 1   DX M17.12  . UNABLE TO FIND Rolling walker with seat x 1  DX m17.12      Allergies:   Crestor [rosuvastatin], Tramadol, and  Penicillins   Social History   Socioeconomic History  . Marital status: Widowed    Spouse name: Not on file  . Number of children: 5  . Years of education: Not on file  . Highest education level: Not on file  Occupational History  . Occupation: Retired  Tobacco Use  . Smoking status: Never Smoker  . Smokeless tobacco: Never Used  Substance and Sexual Activity  . Alcohol use: No  . Drug use: No  . Sexual activity: Never    Birth control/protection: None, Post-menopausal  Other Topics Concern  . Not on file  Social History Narrative  . Not on file   Social Determinants of Health   Financial Resource Strain:   . Difficulty of Paying Living Expenses: Not on file  Food Insecurity:   . Worried About Charity fundraiser in the Last Year: Not on file  . Ran Out of Food in the Last Year: Not on file  Transportation Needs:   . Lack of Transportation (Medical): Not on file  . Lack of Transportation (Non-Medical): Not on file   Physical Activity:   . Days of Exercise per Week: Not on file  . Minutes of Exercise per Session: Not on file  Stress:   . Feeling of Stress : Not on file  Social Connections:   . Frequency of Communication with Friends and Family: Not on file  . Frequency of Social Gatherings with Friends and Family: Not on file  . Attends Religious Services: Not on file  . Active Member of Clubs or Organizations: Not on file  . Attends Archivist Meetings: Not on file  . Marital Status: Not on file     Family History:  The patient's family history includes Breast cancer in her sister; Cancer in her brother; Colon cancer (age of onset: 67) in her father; Congestive Heart Failure in her daughter and son; Diabetes in her brother and mother; Heart attack (age of onset: 48) in her mother; Heart disease in her mother; High Cholesterol in her son; Hypertension in her brother, mother, and son; Stroke in her brother.  ROS:   Please see the history of present illness.  All other systems are reviewed and otherwise negative.    EKGs/Labs/Other Studies Reviewed:    Studies reviewed are outlined and summarized above.  2D echo 04/25/18 Study Conclusions  - Left ventricle: The cavity size was mildly dilated. Wall   thickness was increased in a pattern of moderate LVH. Systolic   function was severely reduced. The estimated ejection fraction   was in the range of 15% to 20%. Diffuse hypokinesis. Doppler   parameters are consistent with abnormal left ventricular   relaxation (grade 1 diastolic dysfunction). Doppler parameters   are consistent with high ventricular filling pressure. - Aortic valve: 23 mm Edwards Sapien 3 transcatheter heart valve is   in the AV position. There was no stenosis. There was no   significant regurgitation. Mean gradient (S): 9 mm Hg. - Mitral valve: Mildly to moderately calcified annulus. Mildly   thickened leaflets . There was mild regurgitation. - Left atrium: The  atrium was mildly dilated. - Pulmonary arteries: Systolic pressure was mildly increased. PA   peak pressure: 31 mm Hg (S). - Technically adequate study.      EKG:  EKG is ordered today, personally reviewed, demonstrating NSR 73bpm, LBBB, similar to prior  Recent Labs: 08/20/2019: ALT 14; BUN 32; Creat 1.81; Hemoglobin 12.5; Platelets 157; Potassium 4.4; Sodium 142  Recent Lipid Panel    Component Value Date/Time   CHOL 168 08/20/2019 1001   TRIG 72 08/20/2019 1001   HDL 58 08/20/2019 1001   CHOLHDL 2.9 08/20/2019 1001   VLDL 21 02/06/2017 0916   LDLCALC 94 08/20/2019 1001    PHYSICAL EXAM:    VS:  BP 114/68   Pulse 78   Temp 98.1 F (36.7 C) (Temporal)   Ht _0  (1.676 m)   Wt 172 lb (78 kg)   SpO2 98%   BMI 27.76 kg/m   BMI: Body mass index is 27.76 kg/m.  GEN: Well nourished, well developed AAF, in no acute distress HEENT: normocephalic, atraumatic Neck: no JVD, carotid bruits, or masses Cardiac: RRR; 3/6 SEM at RUSB, no rubs or gallops, no edema  Respiratory:  clear to auscultation bilaterally, normal work of breathing GI: soft, nontender, nondistended, + BS MS: no deformity or atrophy Skin: warm and dry, no rash Neuro:  Alert and Oriented x 3, Strength and sensation are intact, follows commands Psych: euthymic mood, reserved affect  Wt Readings from Last 3 Encounters:  09/02/19 172 lb (78 kg)  08/22/19 174 lb (78.9 kg)  07/18/19 172 lb (78 kg)     ASSESSMENT & PLAN:   1. Chronic combined CHF - clinically doing remarkably well without acute exacerbation. Per our discussion she has NYHA class II dyspnea. Prior notes outline that she declined BiV-ICD. I brought this up to her today and she requested more information as she did not recall what this really meant. Given her cardiomyopathy and LBBB, she may be a candidate for CRT even if she does not wish to pursue ICD. We had in depth discussion about this today of what some of the benefits are versus natural  expectations of cardiomyopathy. She would like to review with her family. Therefore, per her request, I included detailed information on her AVS outlining what we spoke about today. I asked her to let us know if she felt like she needed more information and we would set up an EP consultation. Will otherwise continue present regimen. Given lack of clinical change and ongoing pandemic with worsening case count/exposure, we will hold off repeating echocardiogram as it will not acutely change management. If she decides to proceed with EP eval, will likely plan to repeat in anticipation of that visit.  She also has comorbidities that include chronic knee pain and CKD. 2. Aortic stenosis s/p TAVR - clinically stable. 3/6 murmur documented at prior eval, similar to my assessment. No recurrent fluid overload issues, worsening dyspnea, palpitations or syncope. See above regarding echocardiogram. 3. LBBB - no recent bradycardia. Continue to monitor. 4. CKD stage III-IV - this is followed by primary care. Appears relatively stable over the last 6 years by our labwork. 5. Hyperlipidemia - this is managed by primary care. I will route note to Dr. Moshe Cipro inquiring about niacin - has fallen out of favor given most recent guidance suggesting it does not offer much by way of clinical benefit. May be more risk than benefit given her concomitant antilipid therapy.  Disposition: F/u with Dr. [name] in 6 months. Refer to EP if she decides she would like their input.  Medication Adjustments/Labs and Tests Ordered: Current medicines are reviewed at length with the patient today.  Concerns regarding medicines are outlined above. Medication changes, Labs and Tests ordered today are summarized above and listed in the Patient Instructions accessible in Encounters.   Signed, Charlie Pitter, PA-C  09/02/2019 4:41  PM    [facility] in [place]. [place],  [place] [address] Ph: [phone]; Fax [phone]

## 2019-08-30 ENCOUNTER — Ambulatory Visit (HOSPITAL_COMMUNITY): Payer: PPO

## 2019-09-02 ENCOUNTER — Other Ambulatory Visit: Payer: Self-pay

## 2019-09-02 ENCOUNTER — Ambulatory Visit (INDEPENDENT_AMBULATORY_CARE_PROVIDER_SITE_OTHER): Payer: PPO | Admitting: Physician Assistant

## 2019-09-02 ENCOUNTER — Encounter: Payer: Self-pay | Admitting: Physician Assistant

## 2019-09-02 VITALS — BP 114/68 | HR 78 | Temp 98.1°F | Ht 66.0 in | Wt 172.0 lb

## 2019-09-02 DIAGNOSIS — Z952 Presence of prosthetic heart valve: Secondary | ICD-10-CM | POA: Diagnosis not present

## 2019-09-02 DIAGNOSIS — I447 Left bundle-branch block, unspecified: Secondary | ICD-10-CM

## 2019-09-02 DIAGNOSIS — N1832 Chronic kidney disease, stage 3b: Secondary | ICD-10-CM

## 2019-09-02 DIAGNOSIS — I350 Nonrheumatic aortic (valve) stenosis: Secondary | ICD-10-CM

## 2019-09-02 DIAGNOSIS — I5042 Chronic combined systolic (congestive) and diastolic (congestive) heart failure: Secondary | ICD-10-CM | POA: Diagnosis not present

## 2019-09-02 DIAGNOSIS — E782 Mixed hyperlipidemia: Secondary | ICD-10-CM

## 2019-09-02 NOTE — Patient Instructions (Addendum)
Medication Instructions:  Your physician recommends that you continue on your current medications as directed. Please refer to the Current Medication list given to you today.  *If you need a refill on your cardiac medications before your next appointment, please call your pharmacy*  Lab Work: NONE  If you have labs (blood work) drawn today and your tests are completely normal, you will receive your results only by: Marland Kitchen MyChart Message (if you have MyChart) OR . A paper copy in the mail If you have any lab test that is abnormal or we need to change your treatment, we will call you to review the results.  Testing/Procedures: NONE   Follow-Up: At [facility], you and your health needs are our priority.  As part of our continuing mission to provide you with exceptional heart care, we have created designated Provider Care Teams.  These Care Teams include your primary Cardiologist (physician) and Advanced Practice Providers (APPs -  Physician Assistants and Nurse Practitioners) who all work together to provide you with the care you need, when you need it.  Your next appointment:   6 month(s)  The format for your next appointment:   In Person  Provider:   Dr. [name]   Other Instructions Thank you for choosing [facility]!   It was a pleasure meeting you today, [name].  I wanted to include this information on your paperwork so you can discuss with your children like you asked. You carry a history of congestive heart failure and weak heart muscle. Your ejection fraction was last checked in 2019 and was 15-20%. Normal is 55-60%. This can make you more susceptible to issues with fluid retention, shortness of breath, or heart rhythm abnormalities that can cause you to pass out or can even be fatal. You also have had an abnormality on your EKG called a left bundle branch block. There are typically 2 types of heart devices that sometimes we consider in your situation, although  the decision may not be clear-cut.  1) A special kind of pacemaker called "cardiac resynchronization therapy." This can help to restore synchronization between the bottom of the chambers of the heart. Some patients see improvement in their heart function with this therapy.  2) Sometimes we combine this special pacemaker with a device called a defibrillator. A defibrillator watches your heart rhythm. If it goes into a dangerous heart rhythm, it either tries to "override" your heart rhythm with the pacemaker or it can shock you back into normal rhythm.  These are typically implanted by a type of cardiologist called an "electrophysiologist." It is a procedure that is done over at [facility]. If this is something you're interested in pursuing, they would help [place] make the final decision. We have other factors to consider which includes your chronic knee pain and the fact that you have abnormal kidney function. If you decide you want more information on this, we will plan to update your echocardiogram then refer you to one of our "EP" specialists to talk more.

## 2019-09-06 ENCOUNTER — Telehealth: Payer: Self-pay

## 2019-09-06 DIAGNOSIS — Z952 Presence of prosthetic heart valve: Secondary | ICD-10-CM

## 2019-09-06 DIAGNOSIS — I5042 Chronic combined systolic (congestive) and diastolic (congestive) heart failure: Secondary | ICD-10-CM

## 2019-09-06 NOTE — Telephone Encounter (Signed)
Attempted to reach, line just rang.I placed referral to EP with Dr.Taylor per Dayna Dunn's 09/02/2019 office note

## 2019-09-06 NOTE — Telephone Encounter (Signed)
Pt states [name] spoke with her months ago about a defibrillator, and she thinks she is ready now. Please call [phone]

## 2019-09-09 ENCOUNTER — Other Ambulatory Visit: Payer: Self-pay

## 2019-09-09 ENCOUNTER — Other Ambulatory Visit: Payer: Self-pay | Admitting: Family Medicine

## 2019-09-09 ENCOUNTER — Telehealth: Payer: Self-pay | Admitting: *Deleted

## 2019-09-09 MED ORDER — NOVOLOG MIX 70/30 FLEXPEN (70-30) 100 UNIT/ML ~~LOC~~ SUPN: PEN_INJECTOR | SUBCUTANEOUS | 0 refills | Status: DC

## 2019-09-09 NOTE — Telephone Encounter (Signed)
Pt called she is completely out of insulin and needs this called into [place]

## 2019-09-09 NOTE — Telephone Encounter (Signed)
Refill sent.

## 2019-09-10 ENCOUNTER — Emergency Department (HOSPITAL_COMMUNITY)
Admission: EM | Admit: 2019-09-10 | Discharge: 2019-09-10 | Disposition: A | Discharge status: Home / Self Care | Payer: PPO | Attending: Emergency Medicine | Admitting: Emergency Medicine

## 2019-09-10 ENCOUNTER — Emergency Department (HOSPITAL_COMMUNITY): Payer: PPO

## 2019-09-10 ENCOUNTER — Other Ambulatory Visit: Payer: Self-pay

## 2019-09-10 ENCOUNTER — Encounter (HOSPITAL_COMMUNITY): Payer: Self-pay | Admitting: Emergency Medicine

## 2019-09-10 DIAGNOSIS — R531 Weakness: Secondary | ICD-10-CM | POA: Insufficient documentation

## 2019-09-10 DIAGNOSIS — I517 Cardiomegaly: Secondary | ICD-10-CM | POA: Diagnosis not present

## 2019-09-10 DIAGNOSIS — I130 Hypertensive heart and chronic kidney disease with heart failure and stage 1 through stage 4 chronic kidney disease, or unspecified chronic kidney disease: Secondary | ICD-10-CM | POA: Diagnosis not present

## 2019-09-10 DIAGNOSIS — R0789 Other chest pain: Secondary | ICD-10-CM | POA: Diagnosis not present

## 2019-09-10 DIAGNOSIS — E1165 Type 2 diabetes mellitus with hyperglycemia: Secondary | ICD-10-CM | POA: Diagnosis not present

## 2019-09-10 DIAGNOSIS — R0600 Dyspnea, unspecified: Secondary | ICD-10-CM | POA: Diagnosis not present

## 2019-09-10 DIAGNOSIS — R5383 Other fatigue: Secondary | ICD-10-CM | POA: Diagnosis not present

## 2019-09-10 DIAGNOSIS — I5042 Chronic combined systolic (congestive) and diastolic (congestive) heart failure: Secondary | ICD-10-CM | POA: Diagnosis not present

## 2019-09-10 DIAGNOSIS — E1122 Type 2 diabetes mellitus with diabetic chronic kidney disease: Secondary | ICD-10-CM | POA: Diagnosis not present

## 2019-09-10 DIAGNOSIS — Z794 Long term (current) use of insulin: Secondary | ICD-10-CM | POA: Insufficient documentation

## 2019-09-10 DIAGNOSIS — Z79899 Other long term (current) drug therapy: Secondary | ICD-10-CM | POA: Diagnosis not present

## 2019-09-10 DIAGNOSIS — I447 Left bundle-branch block, unspecified: Secondary | ICD-10-CM | POA: Diagnosis not present

## 2019-09-10 DIAGNOSIS — Z7982 Long term (current) use of aspirin: Secondary | ICD-10-CM | POA: Diagnosis not present

## 2019-09-10 DIAGNOSIS — N184 Chronic kidney disease, stage 4 (severe): Secondary | ICD-10-CM | POA: Diagnosis not present

## 2019-09-10 DIAGNOSIS — E1140 Type 2 diabetes mellitus with diabetic neuropathy, unspecified: Secondary | ICD-10-CM | POA: Diagnosis not present

## 2019-09-10 LAB — URINALYSIS, ROUTINE W REFLEX MICROSCOPIC
Bilirubin Urine: NEGATIVE
Glucose, UA: 50 mg/dL — AB
Hgb urine dipstick: NEGATIVE
Ketones, ur: NEGATIVE mg/dL
Nitrite: NEGATIVE
Protein, ur: NEGATIVE mg/dL
Specific Gravity, Urine: 1.013 (ref 1.005–1.030)
pH: 6 (ref 5.0–8.0)

## 2019-09-10 LAB — CBC WITH DIFFERENTIAL/PLATELET
Abs Immature Granulocytes: 0.04 10*3/uL (ref 0.00–0.07)
Basophils Absolute: 0.1 10*3/uL (ref 0.0–0.1)
Basophils Relative: 1 %
Eosinophils Absolute: 0.1 10*3/uL (ref 0.0–0.5)
Eosinophils Relative: 1 %
HCT: 37.6 % (ref 36.0–46.0)
Hemoglobin: 11.9 g/dL — ABNORMAL LOW (ref 12.0–15.0)
Immature Granulocytes: 0 %
Lymphocytes Relative: 12 %
Lymphs Abs: 1.2 10*3/uL (ref 0.7–4.0)
MCH: 27.9 pg (ref 26.0–34.0)
MCHC: 31.6 g/dL (ref 30.0–36.0)
MCV: 88.1 fL (ref 80.0–100.0)
Monocytes Absolute: 0.7 10*3/uL (ref 0.1–1.0)
Monocytes Relative: 7 %
Neutro Abs: 8 10*3/uL — ABNORMAL HIGH (ref 1.7–7.7)
Neutrophils Relative %: 79 %
Platelets: 174 10*3/uL (ref 150–400)
RBC: 4.27 MIL/uL (ref 3.87–5.11)
RDW: 16.7 % — ABNORMAL HIGH (ref 11.5–15.5)
WBC: 10.1 10*3/uL (ref 4.0–10.5)
nRBC: 0 % (ref 0.0–0.2)

## 2019-09-10 LAB — COMPREHENSIVE METABOLIC PANEL
ALT: 17 U/L (ref 0–44)
AST: 21 U/L (ref 15–41)
Albumin: 3.9 g/dL (ref 3.5–5.0)
Alkaline Phosphatase: 29 U/L — ABNORMAL LOW (ref 38–126)
Anion gap: 14 (ref 5–15)
BUN: 43 mg/dL — ABNORMAL HIGH (ref 8–23)
CO2: 22 mmol/L (ref 22–32)
Calcium: 9.6 mg/dL (ref 8.9–10.3)
Chloride: 101 mmol/L (ref 98–111)
Creatinine, Ser: 1.61 mg/dL — ABNORMAL HIGH (ref 0.44–1.00)
GFR calc Af Amer: 36 mL/min — ABNORMAL LOW (ref >60)
GFR calc non Af Amer: 31 mL/min — ABNORMAL LOW (ref >60)
Glucose, Bld: 245 mg/dL — ABNORMAL HIGH (ref 70–99)
Potassium: 3.8 mmol/L (ref 3.5–5.1)
Sodium: 137 mmol/L (ref 135–145)
Total Bilirubin: 0.9 mg/dL (ref 0.3–1.2)
Total Protein: 7.3 g/dL (ref 6.5–8.1)

## 2019-09-10 LAB — LACTIC ACID, PLASMA
Lactic Acid, Venous: 1.1 mmol/L (ref 0.5–1.9)
Lactic Acid, Venous: 1 mmol/L (ref 0.5–1.9)

## 2019-09-10 LAB — TROPONIN I (HIGH SENSITIVITY)
Troponin I (High Sensitivity): 21 ng/L — ABNORMAL HIGH (ref <18)
Troponin I (High Sensitivity): 51 ng/L — ABNORMAL HIGH (ref <18)

## 2019-09-10 LAB — BRAIN NATRIURETIC PEPTIDE: B Natriuretic Peptide: 577 pg/mL — ABNORMAL HIGH (ref 0.0–100.0)

## 2019-09-10 MED ORDER — SULFAMETHOXAZOLE-TRIMETHOPRIM 800-160 MG PO TABS
1.0000 | ORAL_TABLET | Freq: Once | ORAL | Status: AC
  Administered 2019-09-10: 1 via ORAL
  Filled 2019-09-10: qty 1

## 2019-09-10 NOTE — Discharge Instructions (Addendum)
I have discussed your care with Dr. [name], your cardiologist, he recommends that you can go home and they will follow up with you in the office.  He recommends that you do not need a pacemaker at this time  ER for worsening symptoms

## 2019-09-10 NOTE — ED Triage Notes (Signed)
Pt arrived by [facility], pt c/o weakness x1 week that has gotten worse. Pt seen cardiology this week and they stated she needed to have a defibrillator placed.

## 2019-09-10 NOTE — ED Provider Notes (Signed)
I have discussed the case with Dr. [name], he knows this patient well, nonischemic cardiomyopathy, he has no concern for the elevation in troponin, this seems reasonable as the patient does not have chest pain.  She has been made aware that she needs to follow-up in the outpatient setting.  At this time she has normal vital signs, no bradycardia or hypotension and does not seem to be in need of an acute pacemaker or defibrillator.  Dr. [name] will set up outpatient follow-up and recommends that the patient does not need to be admitted to the hospital.  This seems reasonable given the patient's presentation vital signs and overall well appearance   Noemi Chapel, MD 09/10/19 1036

## 2019-09-10 NOTE — ED Provider Notes (Signed)
[facility] EMERGENCY DEPARTMENT Provider Note   CSN: [id number] Arrival date & time: 09/10/19  4098   Time seen 6:03 AM  History Chief Complaint  Patient presents with  . Fatigue    [name] [name] is a 74 y.o. female.  HPI Patient is sort of vague and hard to pin down on what is going on tonight.  When asked was going on she states "I am having trouble with my chest.  She states that lasted about an hour and she states it was in the center of her chest and now it is in her left lateral abdomen.  She states however that pain happened 2-3 nights ago.  She states she feels bad and she had some nausea and felt weak.  She states that has been coming and going for 2 weeks.  She states whenever she exerts herself she gets short of breath and has no energy.  She states she is tired, she feels rough, and has no energy.  She states my head bothers me and it sore.  She cannot tell me if she is having a headache or where her pain is located.  She states she did feel woozy 1 time earlier tonight but it did not last long, possibly a few minutes.  She denies any numbness or tingling of her extremities, she denies any visual changes or feeling of spinning or feeling that she is falling.  She denies palpitations or feeling her heart is beating hard.  She states sometimes it skips.  She states her knees hurt because they are bone-on-bone and she has to use a cane.  She denies cough, fever, rhinorrhea, sore throat, loss of taste or smell.  She has not been around anybody else of Covid.  She does describe some urgency and sometimes frequency without dysuria.    PCP Fayrene Helper, MD Cardiology Dr. [name]    Past Medical History:  Diagnosis Date  . Anxiety   . Aortic stenosis   . Arthritis   . Chronic combined systolic and diastolic CHF (congestive heart failure) ([place])   . CKD (chronic kidney disease) stage 3, GFR 30-59 ml/min   . Constipation   . Depression   . Diabetes mellitus, type 2 ([place])    . Essential hypertension, benign   . Hyperlipidemia   . Incidental pulmonary nodule, > 628m and < 827m   7 x 4 mm LLL nodule  . LBBB (left bundle branch block)   . Nonischemic cardiomyopathy (HCC)    No significant obstructive CAD at heart catheterization 05/2014, LVEF 20%  . Obesity   . Pneumonia 2013  . 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 Symptomatic PVCs     Patient Active Problem List   Diagnosis Date Noted  . Generalized osteoarthritis 07/26/2019  . Screening for depression 02/10/2018  . Osteoarthritis of left knee 03/16/2017  . Annual physical exam 08/18/2015  . 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 Severe aortic valve stenosis 03/24/2015  . Hyperlipidemia LDL goal <100 05/24/2014  . Severe aortic stenosis 05/23/2014  . Incidental pulmonary nodule, > 28m428mnd < 8mm46m/29/2015  . Chronic combined systolic and diastolic CHF (congestive heart failure) (HCC)[place]. LBBB (left bundle branch block) 12/03/2013  . 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 Seasonal allergies 02/22/2012  . Essential hypertension, benign   .  Nonischemic cardiomyopathy ([place]) 10/14/2011  . Type 2 diabetes mellitus with nephropathy ([place]) 10/24/2007  . Overweight (BMI 25.0-29.9) 10/24/2007    Past Surgical History:  Procedure Laterality Date  . CARDIAC CATHETERIZATION    . CESAREAN SECTION  1987  . FLEXIBLE BRONCHOSCOPY N/A 01/14/2015   Procedure: FLEXIBLE BRONCHOSCOPY;  Surgeon: [name], MD;  Location: [place];  Service: Thoracic;  Laterality: N/A;  . LEFT AND RIGHT HEART CATHETERIZATION WITH CORONARY ANGIOGRAM N/A 12/05/2011   Procedure: LEFT AND RIGHT HEART CATHETERIZATION WITH CORONARY ANGIOGRAM;  Surgeon: [name], MD;  Location: [facility] CATH LAB;  Service: Cardiovascular;  Laterality: N/A;  . LEFT AND RIGHT HEART  CATHETERIZATION WITH CORONARY ANGIOGRAM N/A 05/23/2014   Procedure: LEFT AND RIGHT HEART CATHETERIZATION WITH CORONARY ANGIOGRAM;  Surgeon: [name], MD;  Location: [facility] CATH LAB;  Service: Cardiovascular;  Laterality: N/A;  . MULTIPLE EXTRACTIONS WITH ALVEOLOPLASTY N/A 02/27/2014   Procedure: Extraction of tooth #'s 2,4,5,6,7,8,9,10,11,12, 22, 23, 24, 25, 26, 28 with alveoloplasty and bilateral mandibular tori reductions;  Surgeon: [name], DDS;  Location: [place];  Service: Oral Surgery;  Laterality: N/A;  . RIGID BRONCHOSCOPY N/A 01/14/2015   Procedure: RIGID BRONCHOSCOPY with Removal Of Foreign Body ;  Surgeon: [name], MD;  Location: [place];  Service: Thoracic;  Laterality: N/A;  . TEE WITHOUT CARDIOVERSION N/A 03/24/2015   Procedure: TRANSESOPHAGEAL ECHOCARDIOGRAM (TEE);  Surgeon: [name], MD;  Location: [place];  Service: Open Heart Surgery;  Laterality: N/A;  . TRANSCATHETER AORTIC VALVE REPLACEMENT, TRANSFEMORAL Bilateral 03/24/2015   Procedure: TRANSCATHETER AORTIC VALVE REPLACEMENT, TRANSFEMORAL;  Surgeon: [name], MD;  Location: [place];  Service: Open Heart Surgery;  Laterality: Bilateral;  . TUBAL LIGATION  1987  . VIDEO BRONCHOSCOPY Bilateral 06/05/2014   Procedure: VIDEO BRONCHOSCOPY WITHOUT FLUORO;  Surgeon: [name], MD;  Location: [facility] ENDOSCOPY;  Service: Cardiopulmonary;  Laterality: Bilateral;     OB History   No obstetric history on file.     Family History  Problem Relation Age of Onset  . Heart disease Mother   . Heart attack Mother 13  . Diabetes Mother   . Hypertension Mother   . Colon cancer Father 35  . Breast cancer Sister   . Diabetes Brother   . Hypertension Brother   . Stroke Brother   . Cancer Brother   . Congestive Heart Failure Daughter   . Hypertension Son   . High Cholesterol Son   . Congestive Heart Failure Son     Social History   Tobacco Use  . Smoking status: Never Smoker  .  Smokeless tobacco: Never Used  Substance Use Topics  . Alcohol use: No  . Drug use: No  Has custody of her 60 year old grandson who lives with her  Home Medications Prior to Admission medications   Medication Sig Start Date End Date Taking? Authorizing Provider  acetaminophen (TYLENOL) 500 MG tablet Take one tablet two times daily, by mouth, for knee  pain 07/18/19   Fayrene Helper, MD  aspirin EC 81 MG tablet Take 81 mg by mouth every morning.     [provider]  blood glucose meter kit and supplies Dispense based on patient and insurance preference. Use to test three times daily (FOR ICD-10 E10.9, E11.9). 05/07/19   Fayrene Helper, MD  carvedilol (COREG) 25 MG tablet TAKE 1 TABLET BY MOUTH TWICE DAILY WITH MEALS. 05/03/19   Arnoldo Lenis, MD  ezetimibe (ZETIA)  10 MG tablet Take 1 tablet (10 mg total) by mouth daily. 05/02/19   Fayrene Helper, MD  fenofibrate (TRICOR) 145 MG tablet TAKE (1) TABLET BY MOUTH ONCE DAILY. 05/02/19   Fayrene Helper, MD  fluticasone (FLONASE) 50 MCG/ACT nasal spray Place 2 sprays into both nostrils daily. Patient taking differently: Place 2 sprays into both nostrils daily as needed for allergies or rhinitis.  12/06/17   Fayrene Helper, MD  furosemide (LASIX) 40 MG tablet Take 1 tablet (40 mg total) by mouth every morning. 05/02/19   Fayrene Helper, MD  glipiZIDE (GLIPIZIDE XL) 5 MG 24 hr tablet Take 1 tablet (5 mg total) by mouth daily with breakfast. 07/18/19   Fayrene Helper, MD  insulin aspart protamine - aspart (NOVOLOG MIX 70/30 FLEXPEN) (70-30) 100 UNIT/ML FlexPen INJECT 12 TO 15 UNITS SUBCUTANEOUSLY TWICE DAILY. 09/09/19   Fayrene Helper, MD  Insulin Pen Needle (LITETOUCH PEN NEEDLES) 31G X 8 MM MISC USE AS NEEDED TO INJECT INSULIN. 07/18/19   Fayrene Helper, MD  Lancets [facility] DELICA PLUS [id number]) MISC USE 3 TIMES A DAY AS DIRECTED. 08/27/19   Fayrene Helper, MD  lidocaine-prilocaine (EMLA) cream  Apply 1-2 application topically 3 (three) times daily as needed. 08/01/17   [provider]  LIVALO 2 MG TABS TAKE ONE TABLET BY MOUTH ONCE DAILY AFTER SUPPER. 08/14/19   Fayrene Helper, MD  methocarbamol (ROBAXIN) 500 MG tablet Take 1 tablet (500 mg total) by mouth every 6 (six) hours as needed for muscle spasms (chest pain). 10/01/18   Rolland Porter, MD  Multiple Vitamins-Minerals (MULTIVITAMIN PO) Take 1 tablet by mouth every morning.     [provider]  niacin (NIASPAN) 500 MG CR tablet TAKE (1) TABLET BY MOUTH AT BEDTIME. 05/02/19   Fayrene Helper, MD  [facility] ULTRA test strip CHECK BLOOD SUGAR 3 TIMES A DAY. 08/02/19   Fayrene Helper, MD  potassium chloride (K-DUR) 10 MEQ tablet TAKE ONE TABLET BY MOUTH DAILY. DO NOT TAKE IF NOT TAKING FUROSEMIDE. 05/02/19   Fayrene Helper, MD  predniSONE (DELTASONE) 10 MG tablet Take 1 tablet (10 mg total) by mouth 2 (two) times daily with a meal. 07/18/19   Fayrene Helper, MD  spironolactone (ALDACTONE) 25 MG tablet Take 1 tablet (25 mg total) by mouth daily. 07/18/19   Fayrene Helper, MD  traMADol Veatrice Bourbon) 50 MG tablet Take one tablet by mouth at bedtime , as needed, for knee pain 05/02/19   Fayrene Helper, MD  UNABLE TO FIND Shower bench x 1   DX M17.12 05/02/19   Fayrene Helper, MD  UNABLE TO FIND Rolling walker with seat x 1  DX m17.12 05/02/19   Fayrene Helper, MD  cetirizine (ZYRTEC) 10 MG tablet Take 1 tablet (10 mg total) by mouth daily. 06/30/11 04/19/18  Fayrene Helper, MD  ferrous sulfate 325 (65 FE) MG EC tablet Take 1 tablet (325 mg total) by mouth 2 (two) times daily. 07/14/11 10/28/11  Fayrene Helper, MD  FLUoxetine (PROZAC) 10 MG tablet Take 1 tablet (10 mg total) by mouth daily. Take one capsule by mouth once a day 12/20/10 11/03/11  Fayrene Helper, MD    Allergies    Crestor [rosuvastatin], Tramadol, and Penicillins  Review of Systems   Review of Systems  All other  systems reviewed and are negative.   Physical Exam Updated Vital Signs BP 135/87 (BP Location: Left Arm)  Pulse 76   Temp 98.5 F (36.9 C) (Oral)   Resp (!) 21   SpO2 99%   Physical Exam Vitals and nursing note reviewed.  Constitutional:      General: She is not in acute distress.    Appearance: Normal appearance. She is well-developed. She is not ill-appearing or toxic-appearing.  HENT:     Head: Normocephalic and atraumatic.     Right Ear: External ear normal.     Left Ear: External ear normal.     Nose: Nose normal. No mucosal edema or rhinorrhea.     Mouth/Throat:     Dentition: No dental abscesses.     Pharynx: No uvula swelling.  Eyes:     Conjunctiva/sclera: Conjunctivae normal.     Pupils: Pupils are equal, round, and reactive to light.  Cardiovascular:     Rate and Rhythm: Normal rate and regular rhythm.     Heart sounds: Murmur present. Systolic murmur present with a grade of 2/6. No friction rub. No gallop.   Pulmonary:     Effort: Pulmonary effort is normal. No respiratory distress.     Breath sounds: Normal breath sounds. No wheezing, rhonchi or rales.  Chest:     Chest wall: No tenderness or crepitus.  Abdominal:     General: Bowel sounds are normal. There is no distension.     Palpations: Abdomen is soft.     Tenderness: There is no abdominal tenderness. There is no guarding or rebound.  Musculoskeletal:        General: No tenderness. Normal range of motion.     Cervical back: Full passive range of motion without pain, normal range of motion and neck supple.     Right lower leg: No edema.     Left lower leg: No edema.     Comments: Moves all extremities well.   Skin:    General: Skin is warm and dry.     Coloration: Skin is not pale.     Findings: No erythema or rash.  Neurological:     General: No focal deficit present.     Mental Status: She is alert and oriented to person, place, and time.     Cranial Nerves: No cranial nerve deficit.    Psychiatric:        Mood and Affect: Affect is flat.        Speech: Speech is delayed.        Behavior: Behavior is slowed.     ED Results / Procedures / Treatments   Labs (all labs ordered are listed, but only abnormal results are displayed) Results for orders placed or performed during the hospital encounter of 09/10/19  Comprehensive metabolic panel  Result Value Ref Range   Sodium 137 135 - 145 mmol/L   Potassium 3.8 3.5 - 5.1 mmol/L   Chloride 101 98 - 111 mmol/L   CO2 22 22 - 32 mmol/L   Glucose, Bld 245 (H) 70 - 99 mg/dL   BUN 43 (H) 8 - 23 mg/dL   Creatinine, Ser 1.61 (H) 0.44 - 1.00 mg/dL   Calcium 9.6 8.9 - 10.3 mg/dL   Total Protein 7.3 6.5 - 8.1 g/dL   Albumin 3.9 3.5 - 5.0 g/dL   AST 21 15 - 41 U/L   ALT 17 0 - 44 U/L   Alkaline Phosphatase 29 (L) 38 - 126 U/L   Total Bilirubin 0.9 0.3 - 1.2 mg/dL   GFR calc non Af Amer 31 (L) >60  mL/min   GFR calc Af Amer 36 (L) >60 mL/min   Anion gap 14 5 - 15  CBC with Differential  Result Value Ref Range   WBC 10.1 4.0 - 10.5 K/uL   RBC 4.27 3.87 - 5.11 MIL/uL   Hemoglobin 11.9 (L) 12.0 - 15.0 g/dL   HCT 37.6 36.0 - 46.0 %   MCV 88.1 80.0 - 100.0 fL   MCH 27.9 26.0 - 34.0 pg   MCHC 31.6 30.0 - 36.0 g/dL   RDW 16.7 (H) 11.5 - 15.5 %   Platelets 174 150 - 400 K/uL   nRBC 0.0 0.0 - 0.2 %   Neutrophils Relative % 79 %   Neutro Abs 8.0 (H) 1.7 - 7.7 K/uL   Lymphocytes Relative 12 %   Lymphs Abs 1.2 0.7 - 4.0 K/uL   Monocytes Relative 7 %   Monocytes Absolute 0.7 0.1 - 1.0 K/uL   Eosinophils Relative 1 %   Eosinophils Absolute 0.1 0.0 - 0.5 K/uL   Basophils Relative 1 %   Basophils Absolute 0.1 0.0 - 0.1 K/uL   Immature Granulocytes 0 %   Abs Immature Granulocytes 0.04 0.00 - 0.07 K/uL  Lactic acid, plasma  Result Value Ref Range   Lactic Acid, Venous 1.1 0.5 - 1.9 mmol/L  Urinalysis, Routine w reflex microscopic  Result Value Ref Range   Color, Urine YELLOW YELLOW   APPearance CLEAR CLEAR   Specific Gravity,  Urine 1.013 1.005 - 1.030   pH 6.0 5.0 - 8.0   Glucose, UA 50 (A) NEGATIVE mg/dL   Hgb urine dipstick NEGATIVE NEGATIVE   Bilirubin Urine NEGATIVE NEGATIVE   Ketones, ur NEGATIVE NEGATIVE mg/dL   Protein, ur NEGATIVE NEGATIVE mg/dL   Nitrite NEGATIVE NEGATIVE   Leukocytes,Ua TRACE (A) NEGATIVE   RBC / HPF 0-5 0 - 5 RBC/hpf   WBC, UA 0-5 0 - 5 WBC/hpf   Bacteria, UA FEW (A) NONE SEEN   Mucus PRESENT    Hyaline Casts, UA PRESENT   Brain natriuretic peptide  Result Value Ref Range   B Natriuretic Peptide 577.0 (H) 0.0 - 100.0 pg/mL  Troponin I (High Sensitivity)  Result Value Ref Range   Troponin I (High Sensitivity) 21 (H) <18 ng/L   Laboratory interpretation all normal except possible UTI, mild anemia hyperglycemia, renal insufficiency, mild elevation of BNP, mildly elevated troponin    EKG EKG Interpretation  Date/Time:  Tuesday September 10 2019 05:13:52 EST Ventricular Rate:  77 PR Interval:    QRS Duration: 168 QT Interval:  480 QTC Calculation: 544 R Axis:   43 Text Interpretation: Sinus rhythm Biatrial enlargement LVH with secondary repolarization abnormality Prolonged QT interval No significant change since last tracing 30 Sep 2018 Confirmed by Rolland Porter [phone]) on 09/10/2019 5:44:57 AM   Radiology DG Chest Port 1 View  Result Date: 09/10/2019 CLINICAL DATA:  History of CHF.  Dyspnea on exertion. EXAM: PORTABLE CHEST 1 VIEW COMPARISON:  09/30/2018 FINDINGS: Cardiomegaly. Transcatheter aortic valve replacement. Scarring in the left mid lung. There is no edema, consolidation, effusion, or pneumothorax. IMPRESSION: 1. No acute finding when compared to prior. 2. Cardiomegaly. Electronically Signed   By: Monte Fantasia M.D.   On: 09/10/2019 07:29    Procedures Procedures (including critical care time)  Cardiology note January 18 1. Chronic combined CHF - clinically doing remarkably well without acute exacerbation. Per our discussion she has NYHA class II dyspnea. Prior  notes outline that she declined BiV-ICD. I brought this up  to her today and she requested more information as she did not recall what this really meant. Given her cardiomyopathy and LBBB, she may be a candidate for CRT even if she does not wish to pursue ICD. We had in depth discussion about this today of what some of the benefits are versus natural expectations of cardiomyopathy. She would like to review with her family. Therefore, per her request, I included detailed information on her AVS outlining what we spoke about today. I asked her to let us know if she felt like she needed more information and we would set up an EP consultation. Will otherwise continue present regimen. Given lack of clinical change and ongoing pandemic with worsening case count/exposure, we will hold off repeating echocardiogram as it will not acutely change management. If she decides to proceed with EP eval, will likely plan to repeat in anticipation of that visit.  She also has comorbidities that include chronic knee pain and CKD. 2. Aortic stenosis s/p TAVR - clinically stable. 3/6 murmur documented at prior eval, similar to my assessment. No recurrent fluid overload issues, worsening dyspnea, palpitations or syncope. See above regarding echocardiogram. 3. LBBB - no recent bradycardia. Continue to monitor. 4. CKD stage III-IV - this is followed by primary care. Appears relatively stable over the last 6 years by our labwork. 5. Hyperlipidemia - this is managed by primary care. I will route note to Dr. Simpson inquiring about niacin - has fallen out of favor given most recent guidance suggesting it does not offer much by way of clinical benefit. May be more risk than benefit given her concomitant antilipid therapy.  Medications Ordered in ED Medications  sulfamethoxazole-trimethoprim (BACTRIM DS) 800-160 MG per tablet 1 tablet (1 tablet Oral Given 09/10/19 0740)    ED Course  I have reviewed the triage vital signs and the  nursing notes.  Pertinent labs & imaging results that were available during my care of the patient were reviewed by me and considered in my medical decision making (see chart for details).    MDM Rules/Calculators/A&P                      Laboratory testing and chest x-ray was done to evaluate patient's complaints of fatigue, dyspnea on exertion, and mild chest discomfort.  Urine culture was done and patient was started on Septra DS, she has a penicillin allergy.  7:50 AM at change of shift patient was left with Dr. Miller to get her delta troponin. I suspect she will be able to be discharged home, she has an appointment with the EP cardiologist on 24 February.  She can follow-up of her PCP if she continues to have fatigue.   Final Clinical Impression(s) / ED Diagnoses Final diagnoses:  Fatigue, unspecified type  Weakness    Rx / DC Orders  Disposition pending  Iva Knapp, MD, FACEP    Knapp, Iva, MD 09/10/19 0749  

## 2019-09-13 LAB — URINE CULTURE: Culture: 50000 — AB

## 2019-09-15 ENCOUNTER — Telehealth: Payer: Self-pay | Admitting: Emergency Medicine

## 2019-09-15 NOTE — Telephone Encounter (Signed)
Post ED Visit - Positive Culture Follow-up: Unsuccessful Patient Follow-up  Culture assessed and recommendations reviewed by:  []  Elenor Quinones, Pharm.D. []  Heide Guile, Pharm.D., BCPS AQ-ID []  Parks Neptune, Pharm.D., BCPS []  Alycia Rossetti, Pharm.D., BCPS []  [place], Pharm.D., BCPS, AAHIVP []  Legrand Como, Pharm.D., BCPS, AAHIVP [x]  Duanne Limerick, PharmD []  Vincenza Hews, PharmD, BCPS  Positive urine culture  [x]  Patient discharged without antimicrobial prescription and treatment is now indicated []  Organism is resistant to prescribed ED discharge antimicrobial []  Patient with positive blood cultures   Unable to contact patient by phone, letter will be sent to address on file.  Plan: start Keflex 500 mg PO BID x three days - Janetta Hora PA  Larene Beach C Mckinsley Koelzer 09/15/2019, 4:15 PM

## 2019-09-18 NOTE — Progress Notes (Signed)
Cardiology Office Note    Date:  09/19/2019   ID:  [name], [name] 03-26-1946, MRN [id number]  PCP:  [name] Helper, MD  Cardiologist: [name] Lesches, MD    Chief Complaint  Patient presents with  . Follow-up    recent Emergency Dept visit    History of Present Illness:    [name] [name] is a 74 y.o. female with past medical history of chronic combined systolic and diastolic CHF/NICM (EF 40-98% by echo in 2019, cath in 2015 showing no significant CAD), severe AS (s/p TAVR in 03/2015), HTN, HLD, Type 2 DM, Stage 3-4 CKD and known LBBB who presents to the office today for Emergency Department follow-up.   She was last examined by [name] Copa, PA-C on 09/02/2019 and reported overall doing well and denied any chest pain or palpitations. Did report baseline dyspnea on exertion but no acute change in her symptoms. By review of prior notes, she had declined BiV-ICD in the past and this was reviewed again with the patient. Was thought she might be a candidate for CRT given her LBBB even without ICD and she wished to further review with her family and call back in regards to arranging follow-up with EP. No changes were made to her medication regimen and she was not started on ACE-I/ARB/ARNI given her CKD.   In the interim, she presented to [facility] ED on 09/10/2019 due to worsening weakness and "wanting to have a defibrillator placed". Initial and delta HS Troponin values were flat at 21 and 51. BNP 577. CXR showed cardiomegaly but no acute findings. EKG showed NSR, HR 77 with known LBBB. Given no acute change in her symptoms, she was advised to follow-up with Cardiology and EP as an outpatient.   In talking with the patient today, she reports on the day of ED evaluation she had developed a discomfort along her knees which radiated into her back into her left shoulder. 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 She previously received steroid  injections with minimal improvement in her symptoms. She is able to perform her household chores on a daily basis without any anginal symptoms. She denies any recent exertional chest pain, dyspnea, orthopnea, PND or lower extremity edema. She does a majority of her own cooking and does not add salt to her meals.  She does not check her blood pressure regularly at home but it is well controlled at 119/70 during today's visit.   Past Medical History:  Diagnosis Date  . Anxiety   . Aortic stenosis   . Arthritis   . Chronic combined systolic and diastolic CHF (congestive heart failure) ([place])   . CKD (chronic kidney disease) stage 3, GFR 30-59 ml/min   . Constipation   . Depression   . Diabetes mellitus, type 2 ([place])   . Essential hypertension, benign   . Hyperlipidemia   . Incidental pulmonary nodule, > 67m and < 815m   7 x 4 mm LLL nodule  . LBBB (left bundle branch block)   . Nonischemic cardiomyopathy (HCC)    No significant obstructive CAD at heart catheterization 05/2014, LVEF 20%  . Obesity   . Pneumonia 2013  . 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 Symptomatic PVCs     Past Surgical History:  Procedure Laterality Date  . CARDIAC CATHETERIZATION    .  CESAREAN SECTION  1987  . FLEXIBLE BRONCHOSCOPY N/A 01/14/2015   Procedure: FLEXIBLE BRONCHOSCOPY;  Surgeon: [name], MD;  Location: [place];  Service: Thoracic;  Laterality: N/A;  . LEFT AND RIGHT HEART CATHETERIZATION WITH CORONARY ANGIOGRAM N/A 12/05/2011   Procedure: LEFT AND RIGHT HEART CATHETERIZATION WITH CORONARY ANGIOGRAM;  Surgeon: [name], MD;  Location: [facility] CATH LAB;  Service: Cardiovascular;  Laterality: N/A;  . LEFT AND RIGHT HEART CATHETERIZATION WITH CORONARY ANGIOGRAM N/A 05/23/2014   Procedure: LEFT AND RIGHT HEART CATHETERIZATION WITH CORONARY ANGIOGRAM;  Surgeon:  [name], MD;  Location: [facility] CATH LAB;  Service: Cardiovascular;  Laterality: N/A;  . MULTIPLE EXTRACTIONS WITH ALVEOLOPLASTY N/A 02/27/2014   Procedure: Extraction of tooth #'s 2,4,5,6,7,8,9,10,11,12, 22, 23, 24, 25, 26, 28 with alveoloplasty and bilateral mandibular tori reductions;  Surgeon: [name] [name], DDS;  Location: [place];  Service: Oral Surgery;  Laterality: N/A;  . RIGID BRONCHOSCOPY N/A 01/14/2015   Procedure: RIGID BRONCHOSCOPY with Removal Of Foreign Body ;  Surgeon: [name], MD;  Location: [place];  Service: Thoracic;  Laterality: N/A;  . TEE WITHOUT CARDIOVERSION N/A 03/24/2015   Procedure: TRANSESOPHAGEAL ECHOCARDIOGRAM (TEE);  Surgeon: [name], MD;  Location: [place];  Service: Open Heart Surgery;  Laterality: N/A;  . TRANSCATHETER AORTIC VALVE REPLACEMENT, TRANSFEMORAL Bilateral 03/24/2015   Procedure: TRANSCATHETER AORTIC VALVE REPLACEMENT, TRANSFEMORAL;  Surgeon: [name], MD;  Location: [place];  Service: Open Heart Surgery;  Laterality: Bilateral;  . TUBAL LIGATION  1987  . VIDEO BRONCHOSCOPY Bilateral 06/05/2014   Procedure: VIDEO BRONCHOSCOPY WITHOUT FLUORO;  Surgeon: [name] Doom, MD;  Location: [facility] ENDOSCOPY;  Service: Cardiopulmonary;  Laterality: Bilateral;    Current Medications: Outpatient Medications Prior to Visit  Medication Sig Dispense Refill  . acetaminophen (TYLENOL) 500 MG tablet Take one tablet two times daily, by mouth, for knee  pain 60 tablet 3  . aspirin EC 81 MG tablet Take 81 mg by mouth every morning.     . blood glucose meter kit and supplies Dispense based on patient and insurance preference. Use to test three times daily (FOR ICD-10 E10.9, E11.9). 1 each 0  . carvedilol (COREG) 25 MG tablet TAKE 1 TABLET BY MOUTH TWICE DAILY WITH MEALS. 60 tablet 11  . ezetimibe (ZETIA) 10 MG tablet Take 1 tablet (10 mg total) by mouth daily. 90 tablet 1  . fenofibrate (TRICOR) 145 MG tablet TAKE (1) TABLET BY  MOUTH ONCE DAILY. 30 tablet 5  . fluticasone (FLONASE) 50 MCG/ACT nasal spray Place 2 sprays into both nostrils daily. (Patient taking differently: Place 2 sprays into both nostrils daily as needed for allergies or rhinitis. ) 16 g 6  . furosemide (LASIX) 40 MG tablet Take 1 tablet (40 mg total) by mouth every morning. 30 tablet 5  . glipiZIDE (GLIPIZIDE XL) 5 MG 24 hr tablet Take 1 tablet (5 mg total) by mouth daily with breakfast. 90 tablet 1  . insulin aspart protamine - aspart (NOVOLOG MIX 70/30 FLEXPEN) (70-30) 100 UNIT/ML FlexPen INJECT 12 TO 15 UNITS SUBCUTANEOUSLY TWICE DAILY. 15 mL 0  . Insulin Pen Needle (LITETOUCH PEN NEEDLES) 31G X 8 MM MISC USE AS NEEDED TO INJECT INSULIN. 100 each 5  . Lancets (ONETOUCH DELICA PLUS [id number]) MISC USE 3 TIMES A DAY AS DIRECTED. 100 each 0  . lidocaine-prilocaine (EMLA) cream Apply 1-2 application topically 3 (three) times daily as needed.    Marland Kitchen LIVALO 2 MG TABS TAKE ONE  TABLET BY MOUTH ONCE DAILY AFTER SUPPER. 90 tablet 0  . methocarbamol (ROBAXIN) 500 MG tablet Take 1 tablet (500 mg total) by mouth every 6 (six) hours as needed for muscle spasms (chest pain). 40 tablet 0  . Multiple Vitamins-Minerals (MULTIVITAMIN PO) Take 1 tablet by mouth every morning.     . niacin (NIASPAN) 500 MG CR tablet TAKE (1) TABLET BY MOUTH AT BEDTIME. 90 tablet 1  . ONETOUCH ULTRA test strip CHECK BLOOD SUGAR 3 TIMES A DAY. 100 strip 0  . potassium chloride (K-DUR) 10 MEQ tablet TAKE ONE TABLET BY MOUTH DAILY. DO NOT TAKE IF NOT TAKING FUROSEMIDE. 30 tablet 5  . predniSONE (DELTASONE) 10 MG tablet Take 1 tablet (10 mg total) by mouth 2 (two) times daily with a meal. 10 tablet 0  . spironolactone (ALDACTONE) 25 MG tablet Take 1 tablet (25 mg total) by mouth daily. 90 tablet 1  . traMADol (ULTRAM) 50 MG tablet Take one tablet by mouth at bedtime , as needed, for knee pain 30 tablet 2  . UNABLE TO FIND Shower bench x 1   DX M17.12 1 each 0  . UNABLE TO FIND Rolling walker  with seat x 1  DX m17.12 1 each 0   No facility-administered medications prior to visit.     Allergies:   Crestor [rosuvastatin], Tramadol, and Penicillins   Social History   Socioeconomic History  . Marital status: Widowed    Spouse name: Not on file  . Number of children: 5  . Years of education: Not on file  . Highest education level: Not on file  Occupational History  . Occupation: Retired  Tobacco Use  . Smoking status: Never Smoker  . Smokeless tobacco: Never Used  Substance and Sexual Activity  . Alcohol use: No  . Drug use: No  . Sexual activity: Never    Birth control/protection: None, Post-menopausal  Other Topics Concern  . Not on file  Social History Narrative  . Not on file   Social Determinants of Health   Financial Resource Strain:   . Difficulty of Paying Living Expenses: Not on file  Food Insecurity:   . Worried About Charity fundraiser in the Last Year: Not on file  . Ran Out of Food in the Last Year: Not on file  Transportation Needs:   . Lack of Transportation (Medical): Not on file  . Lack of Transportation (Non-Medical): Not on file  Physical Activity:   . Days of Exercise per Week: Not on file  . Minutes of Exercise per Session: Not on file  Stress:   . Feeling of Stress : Not on file  Social Connections:   . Frequency of Communication with Friends and Family: Not on file  . Frequency of Social Gatherings with Friends and Family: Not on file  . Attends Religious Services: Not on file  . Active Member of Clubs or Organizations: Not on file  . Attends Archivist Meetings: Not on file  . Marital Status: Not on file     Family History:  The patient's family history includes Breast cancer in her sister; Cancer in her brother; Colon cancer (age of onset: 79) in her father; Congestive Heart Failure in her daughter and son; Diabetes in her brother and mother; Heart attack (age of onset: 61) in her mother; Heart disease in her mother;  High Cholesterol in her son; Hypertension in her brother, mother, and son; Stroke in her brother.   Review of  Systems:   Please see the history of present illness.     General:  No chills, fever, night sweats or weight changes. Positive for joint pain.  Cardiovascular:  No chest pain, dyspnea on exertion, edema, orthopnea, palpitations, paroxysmal nocturnal dyspnea. Dermatological: No rash, lesions/masses Respiratory: No cough, dyspnea Urologic: No hematuria, dysuria Abdominal:   No nausea, vomiting, diarrhea, bright red blood per rectum, melena, or hematemesis Neurologic:  No visual changes, wkns, changes in mental status. All other systems reviewed and are otherwise negative except as noted above.   Physical Exam:    VS:  BP 119/70   Pulse 72   Temp 98 F (36.7 C) (Temporal)   Ht 5' 6" (1.676 m)   Wt 174 lb (78.9 kg)   SpO2 97%   BMI 28.08 kg/m    General: Well developed, well nourished,female appearing in no acute distress. Head: Normocephalic, atraumatic, sclera non-icteric, no xanthomas, nares are without discharge.  Neck: No carotid bruits. JVD not elevated.  Lungs: Respirations regular and unlabored, without wheezes or rales.  Heart: Regular rate and rhythm. No S3 or S4.  No rubs, or gallops appreciated. 3/6 SEM along RUSB.  Abdomen: Soft, non-tender, non-distended with normoactive bowel sounds. No hepatomegaly. No rebound/guarding. No obvious abdominal masses. Msk:  Strength and tone appear normal for age. No joint deformities or effusions. Extremities: No clubbing or cyanosis. No lower extremity edema.  Distal pedal pulses are 2+ bilaterally. Neuro: Alert and oriented X 3. Moves all extremities spontaneously. No focal deficits noted. Psych:  Responds to questions appropriately with a normal affect. Skin: No rashes or lesions noted  Wt Readings from Last 3 Encounters:  09/19/19 174 lb (78.9 kg)  09/02/19 172 lb (78 kg)  08/22/19 174 lb (78.9 kg)     Studies/Labs  Reviewed:   EKG:  EKG is not ordered today.    Recent Labs: 09/10/2019: ALT 17; B Natriuretic Peptide 577.0; BUN 43; Creatinine, Ser 1.61; Hemoglobin 11.9; Platelets 174; Potassium 3.8; Sodium 137   Lipid Panel    Component Value Date/Time   CHOL 168 08/20/2019 1001   TRIG 72 08/20/2019 1001   HDL 58 08/20/2019 1001   CHOLHDL 2.9 08/20/2019 1001   VLDL 21 02/06/2017 0916   LDLCALC 94 08/20/2019 1001    Additional studies/ records that were reviewed today include:   Echocardiogram: 04/2018 Study Conclusions   - Left ventricle: The cavity size was mildly dilated. Wall  thickness was increased in a pattern of moderate LVH. Systolic  function was severely reduced. The estimated ejection fraction  was in the range of 15% to 20%. Diffuse hypokinesis. Doppler  parameters are consistent with abnormal left ventricular  relaxation (grade 1 diastolic dysfunction). Doppler parameters  are consistent with high ventricular filling pressure.  - Aortic valve: 23 mm Edwards Sapien 3 transcatheter heart valve is  in the AV position. There was no stenosis. There was no  significant regurgitation. Mean gradient (S): 9 mm Hg.  - Mitral valve: Mildly to moderately calcified annulus. Mildly  thickened leaflets . There was mild regurgitation.  - Left atrium: The atrium was mildly dilated.  - Pulmonary arteries: Systolic pressure was mildly increased. PA  peak pressure: 31 mm Hg (S).  - Technically adequate study.   Assessment:    1. Chronic combined systolic and diastolic CHF (congestive heart failure) ([place])   2. LBBB (left bundle branch block)   3. Aortic valve stenosis, etiology of cardiac valve disease unspecified   4. Essential hypertension  5. Stage 3 chronic kidney disease, unspecified whether stage 3a or 3b CKD      Plan:   In order of problems listed above:  1. Chronic Combined Systolic and Diastolic CHF - she has a known reduced EF of 15-20% by most recent  echocardiogram in 2019 and felt to be consistent with NICM as EF was 15% in 2015 with catheterization at that time showing no significant CAD.  - she denies any recent dyspnea on exertion, orthopnea, PND or edema. Appears euvolemic by examination. Continue current regimen with Coreg 55m BID, Spironolactone 223mdaily and Lasix 4056maily. She is not on ACE-I/ARB/ARNI given stage 3-4 CKD.   2. LBBB - chronic. She has scheduled follow-up with EP later this month to further address candidacy for CRT. She had previously declined this along with declining an ICD but this was addressed at her last visit with DayMelina CopaA-C and she is now considering. She is still unsure and I encouraged her to further discuss this with her family prior to her visit with Dr. TayLovena Leter this month. Would recommend a repeat echo prior to her procedure if she does decide to proceed with this.   3. Aortic Stenosis - s/p TAVR in 03/2015. Echocardiogram in 2018 showed no significant stenosis or regurgitation.   4. HTN - BP well-controlled at 119/70 during today's visit. Continue current medication regimen.   5. Stage 3-4 CKD - creatinine stable at 1.61 by most recent labs in 08/2019.    Medication Adjustments/Labs and Tests Ordered: Current medicines are reviewed at length with the patient today.  Concerns regarding medicines are outlined above.  Medication changes, Labs and Tests ordered today are listed in the Patient Instructions below. Patient Instructions  Medication Instructions:  Your physician recommends that you continue on your current medications as directed. Please refer to the Current Medication list given to you today.   Labwork: none  Testing/Procedures: none  Follow-Up: Your physician recommends that you schedule a follow-up appointment in: keep follow up with Dr. TayLovena Le 10/09/19 Your physician recommends that you schedule a follow-up appointment in: 3 months with Dr. McDDomenic Polite  Any  Other Special Instructions Will Be Listed Below (If Applicable).     If you need a refill on your cardiac medications before your next appointment, please call your pharmacy.      Signed, BriErma HeritageA-C  09/19/2019 7:18 PM    [place] [id number] [id number]: [phone]: [phone]

## 2019-09-19 ENCOUNTER — Encounter: Payer: Self-pay | Admitting: Student

## 2019-09-19 ENCOUNTER — Ambulatory Visit (INDEPENDENT_AMBULATORY_CARE_PROVIDER_SITE_OTHER): Payer: PPO | Admitting: Student

## 2019-09-19 ENCOUNTER — Other Ambulatory Visit: Payer: Self-pay

## 2019-09-19 VITALS — BP 119/70 | HR 72 | Temp 98.0°F | Ht 66.0 in | Wt 174.0 lb

## 2019-09-19 DIAGNOSIS — I447 Left bundle-branch block, unspecified: Secondary | ICD-10-CM

## 2019-09-19 DIAGNOSIS — N183 Chronic kidney disease, stage 3 unspecified: Secondary | ICD-10-CM

## 2019-09-19 DIAGNOSIS — I5042 Chronic combined systolic (congestive) and diastolic (congestive) heart failure: Secondary | ICD-10-CM

## 2019-09-19 DIAGNOSIS — I10 Essential (primary) hypertension: Secondary | ICD-10-CM | POA: Diagnosis not present

## 2019-09-19 DIAGNOSIS — I350 Nonrheumatic aortic (valve) stenosis: Secondary | ICD-10-CM

## 2019-09-19 NOTE — Patient Instructions (Signed)
Medication Instructions:  Your physician recommends that you continue on your current medications as directed. Please refer to the Current Medication list given to you today.   Labwork: none  Testing/Procedures: none  Follow-Up: Your physician recommends that you schedule a follow-up appointment in: keep follow up with [name] on 10/09/19 Your physician recommends that you schedule a follow-up appointment in: 3 months with Dr. [name]     Any Other Special Instructions Will Be Listed Below (If Applicable).     If you need a refill on your cardiac medications before your next appointment, please call your pharmacy.

## 2019-10-01 ENCOUNTER — Other Ambulatory Visit: Payer: Self-pay | Admitting: Family Medicine

## 2019-10-04 ENCOUNTER — Other Ambulatory Visit: Payer: Self-pay | Admitting: Family Medicine

## 2019-10-09 ENCOUNTER — Encounter: Payer: Self-pay | Admitting: Internal Medicine

## 2019-10-09 ENCOUNTER — Ambulatory Visit (INDEPENDENT_AMBULATORY_CARE_PROVIDER_SITE_OTHER): Payer: PPO | Admitting: Internal Medicine

## 2019-10-09 VITALS — BP 108/59 | HR 71 | Temp 98.2°F | Ht 66.0 in | Wt 172.8 lb

## 2019-10-09 DIAGNOSIS — I447 Left bundle-branch block, unspecified: Secondary | ICD-10-CM

## 2019-10-09 DIAGNOSIS — I5042 Chronic combined systolic (congestive) and diastolic (congestive) heart failure: Secondary | ICD-10-CM | POA: Diagnosis not present

## 2019-10-09 DIAGNOSIS — Z952 Presence of prosthetic heart valve: Secondary | ICD-10-CM | POA: Diagnosis not present

## 2019-10-09 NOTE — Patient Instructions (Signed)
Medication Instructions:  Your physician recommends that you continue on your current medications as directed. Please refer to the Current Medication list given to you today.  *If you need a refill on your cardiac medications before your next appointment, please call your pharmacy*  Lab Work: NONE   If you have labs (blood work) drawn today and your tests are completely normal, you will receive your results only by: Marland Kitchen MyChart Message (if you have MyChart) OR . A paper copy in the mail If you have any lab test that is abnormal or we need to change your treatment, we will call you to review the results.  Testing/Procedures: Your physician has recommended that you have a pacemaker inserted. A pacemaker is a small device that is placed under the skin of your chest or abdomen to help control abnormal heart rhythms. This device uses electrical pulses to prompt the heart to beat at a normal rate. Pacemakers are used to treat heart rhythms that are too slow. Wire (leads) are attached to the pacemaker that goes into the chambers of you heart. This is done in the hospital and usually requires and overnight stay. Please see the instruction sheet given to you today for more information.    Follow-Up: At [facility], you and your health needs are our priority.  As part of our continuing mission to provide you with exceptional heart care, we have created designated Provider Care Teams.  These Care Teams include your primary Cardiologist (physician) and Advanced Practice Providers (APPs -  Physician Assistants and Nurse Practitioners) who all work together to provide you with the care you need, when you need it.  Your next appointment:    To be Determined   The format for your next appointment:   In Person  Provider:   Cristopher Peru, MD  Other Instructions Thank you for choosing [facility]!  Dates: March 1, 3, 8, 12, 15

## 2019-10-09 NOTE — H&P (View-Only) (Signed)
HPI [name] [name] is referred today by Bernerd Pho to consider ICD insertion. She is a pleasant 74 yo woman with AS, s/p TAVR, non-ischemic CM and LBBB. The patient has not had syncope and has class 2 CHF symptoms. Mostly low output. She has chronic renal insufficiency with a creatinine at baseline of 1.8. She has been on maximal medical therapy.  Allergies  Allergen Reactions  . Crestor [Rosuvastatin] Other (See Comments)    Muscle aches and elevated CK  . Tramadol Nausea Only    States blood sugar elevated also  . Penicillins Other (See Comments)    Family unsure of reaction, but certain mom said she was allergic     Current Outpatient Medications  Medication Sig Dispense Refill  . acetaminophen (TYLENOL) 500 MG tablet Take one tablet two times daily, by mouth, for knee  pain 60 tablet 3  . aspirin EC 81 MG tablet Take 81 mg by mouth every morning.     . blood glucose meter kit and supplies Dispense based on patient and insurance preference. Use to test three times daily (FOR ICD-10 E10.9, E11.9). 1 each 0  . carvedilol (COREG) 25 MG tablet TAKE 1 TABLET BY MOUTH TWICE DAILY WITH MEALS. 60 tablet 11  . ezetimibe (ZETIA) 10 MG tablet Take 1 tablet (10 mg total) by mouth daily. 90 tablet 1  . fenofibrate (TRICOR) 145 MG tablet TAKE (1) TABLET BY MOUTH ONCE DAILY. 30 tablet 5  . fluticasone (FLONASE) 50 MCG/ACT nasal spray Place 2 sprays into both nostrils daily. (Patient taking differently: Place 2 sprays into both nostrils daily as needed for allergies or rhinitis. ) 16 g 6  . furosemide (LASIX) 40 MG tablet TAKE (1) TABLET BY MOUTH EACH MORNING. 30 tablet 0  . glipiZIDE (GLIPIZIDE XL) 5 MG 24 hr tablet Take 1 tablet (5 mg total) by mouth daily with breakfast. 90 tablet 1  . insulin aspart protamine - aspart (NOVOLOG MIX 70/30 FLEXPEN) (70-30) 100 UNIT/ML FlexPen INJECT 12 TO 15 UNITS SUBCUTANEOUSLY TWICE DAILY. 15 mL 0  . Insulin Pen Needle (LITETOUCH PEN NEEDLES) 31G X 8 MM  MISC USE AS NEEDED TO INJECT INSULIN. 100 each 5  . Lancets (ONETOUCH DELICA PLUS [id number]) MISC USE 3 TIMES A DAY AS DIRECTED. 100 each 5  . lidocaine-prilocaine (EMLA) cream Apply 1-2 application topically 3 (three) times daily as needed.    Marland Kitchen LIVALO 2 MG TABS TAKE ONE TABLET BY MOUTH ONCE DAILY AFTER SUPPER. 90 tablet 0  . Multiple Vitamins-Minerals (MULTIVITAMIN PO) Take 1 tablet by mouth every morning.     . niacin (NIASPAN) 500 MG CR tablet TAKE (1) TABLET BY MOUTH AT BEDTIME. 90 tablet 1  . ONETOUCH ULTRA test strip CHECK BLOOD SUGAR 3 TIMES A DAY. 100 strip 0  . potassium chloride (K-DUR) 10 MEQ tablet TAKE ONE TABLET BY MOUTH DAILY. DO NOT TAKE IF NOT TAKING FUROSEMIDE. 30 tablet 5  . spironolactone (ALDACTONE) 25 MG tablet Take 1 tablet (25 mg total) by mouth daily. 90 tablet 1  . UNABLE TO FIND Shower bench x 1   DX M17.12 1 each 0  . UNABLE TO FIND Rolling walker with seat x 1  DX m17.12 1 each 0   No current facility-administered medications for this visit.     Past Medical History:  Diagnosis Date  . Anxiety   . Aortic stenosis   . Arthritis   . Chronic combined systolic and diastolic CHF (congestive heart failure) (  HCC)   . CKD (chronic kidney disease) stage 3, GFR 30-59 ml/min   . Constipation   . Depression   . Diabetes mellitus, type 2 (HCC)   . Essential hypertension, benign   . Hyperlipidemia   . Incidental pulmonary nodule, > 3mm and < 8mm    7 x 4 mm LLL nodule  . LBBB (left bundle branch block)   . Nonischemic cardiomyopathy (HCC)    No significant obstructive CAD at heart catheterization 05/2014, LVEF 20%  . Obesity   . Pneumonia 2013  . 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 Symptomatic PVCs     ROS:   All systems reviewed and negative except as noted in the HPI.   Past Surgical History:  Procedure Laterality Date  . CARDIAC  CATHETERIZATION    . CESAREAN SECTION  1987  . FLEXIBLE BRONCHOSCOPY N/A 01/14/2015   Procedure: FLEXIBLE BRONCHOSCOPY;  Surgeon: Steven C Hendrickson, MD;  Location: [facility];  Service: Thoracic;  Laterality: N/A;  . LEFT AND RIGHT HEART CATHETERIZATION WITH CORONARY ANGIOGRAM N/A 12/05/2011   Procedure: LEFT AND RIGHT HEART CATHETERIZATION WITH CORONARY ANGIOGRAM;  Surgeon: Christopher D McAlhany, MD;  Location: [facility];  Service: Cardiovascular;  Laterality: N/A;  . LEFT AND RIGHT HEART CATHETERIZATION WITH CORONARY ANGIOGRAM N/A 05/23/2014   Procedure: LEFT AND RIGHT HEART CATHETERIZATION WITH CORONARY ANGIOGRAM;  Surgeon: Christopher D McAlhany, MD;  Location: [facility];  Service: Cardiovascular;  Laterality: N/A;  . MULTIPLE EXTRACTIONS WITH ALVEOLOPLASTY N/A 02/27/2014   Procedure: Extraction of tooth #'s 2,4,5,6,7,8,9,10,11,12, 22, 23, 24, 25, 26, 28 with alveoloplasty and bilateral mandibular tori reductions;  Surgeon: Ronald F Kulinski, DDS;  Location: [facility];  Service: Oral Surgery;  Laterality: N/A;  . RIGID BRONCHOSCOPY N/A 01/14/2015   Procedure: RIGID BRONCHOSCOPY with Removal Of Foreign Body ;  Surgeon: Steven C Hendrickson, MD;  Location: [facility];  Service: Thoracic;  Laterality: N/A;  . TEE WITHOUT CARDIOVERSION N/A 03/24/2015   Procedure: TRANSESOPHAGEAL ECHOCARDIOGRAM (TEE);  Surgeon: Christopher D McAlhany, MD;  Location: [facility];  Service: Open Heart Surgery;  Laterality: N/A;  . TRANSCATHETER AORTIC VALVE REPLACEMENT, TRANSFEMORAL Bilateral 03/24/2015   Procedure: TRANSCATHETER AORTIC VALVE REPLACEMENT, TRANSFEMORAL;  Surgeon: Christopher D McAlhany, MD;  Location: [facility];  Service: Open Heart Surgery;  Laterality: Bilateral;  . TUBAL LIGATION  1987  . VIDEO BRONCHOSCOPY Bilateral 06/05/2014   Procedure: VIDEO BRONCHOSCOPY WITHOUT FLUORO;  Surgeon: Douglas B McQuaid, MD;  Location: [facility];  Service: Cardiopulmonary;  Laterality: Bilateral;     Family History  Problem Relation  Age of Onset  . Heart disease Mother   . Heart attack Mother 83  . Diabetes Mother   . Hypertension Mother   . Colon cancer Father 80  . Breast cancer Sister   . Diabetes Brother   . Hypertension Brother   . Stroke Brother   . Cancer Brother   . Congestive Heart Failure Daughter   . Hypertension Son   . High Cholesterol Son   . Congestive Heart Failure Son      Social History   Socioeconomic History  . Marital status: Widowed    Spouse name: Not on file  . Number of children: 5  . Years of education: Not on file  . Highest education level: Not on file  Occupational History  . Occupation: Retired  Tobacco Use  . Smoking status:   Never Smoker  . Smokeless tobacco: Never Used  Substance and Sexual Activity  . Alcohol use: No  . Drug use: No  . Sexual activity: Never    Birth control/protection: None, Post-menopausal  Other Topics Concern  . Not on file  Social History Narrative  . Not on file   Social Determinants of Health   Financial Resource Strain:   . Difficulty of Paying Living Expenses: Not on file  Food Insecurity:   . Worried About Charity fundraiser in the Last Year: Not on file  . Ran Out of Food in the Last Year: Not on file  Transportation Needs:   . Lack of Transportation (Medical): Not on file  . Lack of Transportation (Non-Medical): Not on file  Physical Activity:   . Days of Exercise per Week: Not on file  . Minutes of Exercise per Session: Not on file  Stress:   . Feeling of Stress : Not on file  Social Connections:   . Frequency of Communication with Friends and Family: Not on file  . Frequency of Social Gatherings with Friends and Family: Not on file  . Attends Religious Services: Not on file  . Active Member of Clubs or Organizations: Not on file  . Attends Archivist Meetings: Not on file  . Marital Status: Not on file  Intimate Partner Violence:   . Fear of Current or Ex-Partner: Not on file  . Emotionally Abused: Not on  file  . Physically Abused: Not on file  . Sexually Abused: Not on file     BP (!) 108/59   Pulse 71   Temp 98.2 F (36.8 C)   Ht 5' 6"  (1.676 m)   Wt 172 lb 12.8 oz (78.4 kg)   BMI 27.89 kg/m   Physical Exam:  Well appearing 74 yo woman, NAD HEENT: Unremarkable Neck:  No JVD, no thyromegally Lymphatics:  No adenopathy Back:  No CVA tenderness Lungs:  Clear with no wheezes HEART:  Regular rate rhythm, 1/6 systolic murmur, no rubs, no clicks Abd:  soft, positive bowel sounds, no organomegally, no rebound, no guarding Ext:  2 plus pulses, no edema, no cyanosis, no clubbing Skin:  No rashes no nodules Neuro:  CN II through XII intact, motor grossly intact  EKG - nsr with LBBB, reviewed  Assess/Plan: 1. Chronic systolic heart failure - she has a longstanding LV dysfunction with an EF of 20%. She is on maximal medical therapy. I have discussed the indications for ICD insertion and she wishes to proceed. 2. LBBB - with this we will plan placement of an LV lead. 3. AS - she is doing well, s/p TAVR.  Mikle Bosworth.D.

## 2019-10-09 NOTE — Progress Notes (Signed)
HPI [name] [name] is referred today by Bernerd Pho to consider ICD insertion. She is a pleasant 74 yo woman with AS, s/p TAVR, non-ischemic CM and LBBB. The patient has not had syncope and has class 2 CHF symptoms. Mostly low output. She has chronic renal insufficiency with a creatinine at baseline of 1.8. She has been on maximal medical therapy.  Allergies  Allergen Reactions  . Crestor [Rosuvastatin] Other (See Comments)    Muscle aches and elevated CK  . Tramadol Nausea Only    States blood sugar elevated also  . Penicillins Other (See Comments)    Family unsure of reaction, but certain mom said she was allergic     Current Outpatient Medications  Medication Sig Dispense Refill  . acetaminophen (TYLENOL) 500 MG tablet Take one tablet two times daily, by mouth, for knee  pain 60 tablet 3  . aspirin EC 81 MG tablet Take 81 mg by mouth every morning.     . blood glucose meter kit and supplies Dispense based on patient and insurance preference. Use to test three times daily (FOR ICD-10 E10.9, E11.9). 1 each 0  . carvedilol (COREG) 25 MG tablet TAKE 1 TABLET BY MOUTH TWICE DAILY WITH MEALS. 60 tablet 11  . ezetimibe (ZETIA) 10 MG tablet Take 1 tablet (10 mg total) by mouth daily. 90 tablet 1  . fenofibrate (TRICOR) 145 MG tablet TAKE (1) TABLET BY MOUTH ONCE DAILY. 30 tablet 5  . fluticasone (FLONASE) 50 MCG/ACT nasal spray Place 2 sprays into both nostrils daily. (Patient taking differently: Place 2 sprays into both nostrils daily as needed for allergies or rhinitis. ) 16 g 6  . furosemide (LASIX) 40 MG tablet TAKE (1) TABLET BY MOUTH EACH MORNING. 30 tablet 0  . glipiZIDE (GLIPIZIDE XL) 5 MG 24 hr tablet Take 1 tablet (5 mg total) by mouth daily with breakfast. 90 tablet 1  . insulin aspart protamine - aspart (NOVOLOG MIX 70/30 FLEXPEN) (70-30) 100 UNIT/ML FlexPen INJECT 12 TO 15 UNITS SUBCUTANEOUSLY TWICE DAILY. 15 mL 0  . Insulin Pen Needle (LITETOUCH PEN NEEDLES) 31G X 8 MM  MISC USE AS NEEDED TO INJECT INSULIN. 100 each 5  . Lancets (ONETOUCH DELICA PLUS [id number]) MISC USE 3 TIMES A DAY AS DIRECTED. 100 each 5  . lidocaine-prilocaine (EMLA) cream Apply 1-2 application topically 3 (three) times daily as needed.    Marland Kitchen LIVALO 2 MG TABS TAKE ONE TABLET BY MOUTH ONCE DAILY AFTER SUPPER. 90 tablet 0  . Multiple Vitamins-Minerals (MULTIVITAMIN PO) Take 1 tablet by mouth every morning.     . niacin (NIASPAN) 500 MG CR tablet TAKE (1) TABLET BY MOUTH AT BEDTIME. 90 tablet 1  . ONETOUCH ULTRA test strip CHECK BLOOD SUGAR 3 TIMES A DAY. 100 strip 0  . potassium chloride (K-DUR) 10 MEQ tablet TAKE ONE TABLET BY MOUTH DAILY. DO NOT TAKE IF NOT TAKING FUROSEMIDE. 30 tablet 5  . spironolactone (ALDACTONE) 25 MG tablet Take 1 tablet (25 mg total) by mouth daily. 90 tablet 1  . UNABLE TO FIND Shower bench x 1   DX M17.12 1 each 0  . UNABLE TO FIND Rolling walker with seat x 1  DX m17.12 1 each 0   No current facility-administered medications for this visit.     Past Medical History:  Diagnosis Date  . Anxiety   . Aortic stenosis   . Arthritis   . Chronic combined systolic and diastolic CHF (congestive heart failure) (  HCC)   . CKD (chronic kidney disease) stage 3, GFR 30-59 ml/min   . Constipation   . Depression   . Diabetes mellitus, type 2 ([place])   . Essential hypertension, benign   . Hyperlipidemia   . Incidental pulmonary nodule, > 60m and < 85m   7 x 4 mm LLL nodule  . LBBB (left bundle branch block)   . Nonischemic cardiomyopathy (HCC)    No significant obstructive CAD at heart catheterization 05/2014, LVEF 20%  . Obesity   . Pneumonia 2013  . 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 Symptomatic PVCs     ROS:   All systems reviewed and negative except as noted in the HPI.   Past Surgical History:  Procedure Laterality Date  . CARDIAC  CATHETERIZATION    . CESAREAN SECTION  1987  . FLEXIBLE BRONCHOSCOPY N/A 01/14/2015   Procedure: FLEXIBLE BRONCHOSCOPY;  Surgeon: StMelrose NakayamaMD;  [facility]: [facility];  Laterality: N/A;  . LEFT AND RIGHT HEART CATHETERIZATION WITH CORONARY ANGIOGRAM N/A 12/05/2011   Procedure: LEFT AND RIGHT HEART CATHETERIZATION WITH CORONARY ANGIOGRAM;  Surgeon: ChBurnell BlanksMD;  [facility];  Service: Cardiovascular;  Laterality: N/A;  . LEFT AND RIGHT HEART CATHETERIZATION WITH CORONARY ANGIOGRAM N/A 05/23/2014   Procedure: LEFT AND RIGHT HEART CATHETERIZATION WITH CORONARY ANGIOGRAM;  Surgeon: ChBurnell BlanksMD;  [facility];  Service: Cardiovascular;  Laterality: N/A;  . MULTIPLE EXTRACTIONS WITH ALVEOLOPLASTY N/A 02/27/2014   Procedure: Extraction of tooth #'s 2,4,5,6,7,8,9,10,11,12, 22, 23, 24, 25, 26, 28 with alveoloplasty and bilateral mandibular tori reductions;  Surgeon: RoLenn CalDDS;  [facility] Service: Oral Surgery;  Laterality: N/A;  . RIGID BRONCHOSCOPY N/A 01/14/2015   Procedure: RIGID BRONCHOSCOPY with Removal Of Foreign Body ;  Surgeon: StMelrose NakayamaMD;  [facility]: Thor[facility];  Laterality: N/A;  . TEE WITHOUT CARDIOVERSION N/A 03/24/2015   Procedure: TRANSESOPHAGEAL ECHOCARDIOGRAM (TEE);  Surgeon: ChBurnell BlanksMD;  [facility] Service: Open Heart Surgery;  Laterality: N/A;  . TRANSCATHETER AORTIC VALVE REPLACEMENT, TRANSFEMORAL Bilateral 03/24/2015   Procedure: TRANSCATHETER AORTIC VALVE REPLACEMENT, TRANSFEMORAL;  Surgeon: ChBurnell BlanksMD;  [place] [facility]: [facility];  Laterality: Bilateral;  . TUBAL LIGATION  1987  . VIDEO BRONCHOSCOPY Bilateral 06/05/2014   Procedure: VIDEO BRONCHOSCOPY WITHOUT FLUORO;  Surgeon: DoJuanito DoomMD;  [facility];  Service: Cardiopulmonary;  Laterality: Bilateral;     Family History  Problem Relation  Age of Onset  . Heart disease Mother   . Heart attack Mother 8364. Diabetes Mother   . Hypertension Mother   . Colon cancer Father 8026. Breast cancer Sister   . Diabetes Brother   . Hypertension Brother   . Stroke Brother   . Cancer Brother   . Congestive Heart Failure Daughter   . Hypertension Son   . High Cholesterol Son   . Congestive Heart Failure Son      Social History   Socioeconomic History  . Marital status: Widowed    Spouse name: Not on file  . Number of children: 5  . Years of education: Not on file  . Highest education level: Not on file  Occupational History  . Occupation: Retired  Tobacco Use  . Smoking status:  Never Smoker  . Smokeless tobacco: Never Used  Substance and Sexual Activity  . Alcohol use: No  . Drug use: No  . Sexual activity: Never    Birth control/protection: None, Post-menopausal  Other Topics Concern  . Not on file  Social History Narrative  . Not on file   Social Determinants of Health   Financial Resource Strain:   . Difficulty of Paying Living Expenses: Not on file  Food Insecurity:   . Worried About Charity fundraiser in the Last Year: Not on file  . Ran Out of Food in the Last Year: Not on file  Transportation Needs:   . Lack of Transportation (Medical): Not on file  . Lack of Transportation (Non-Medical): Not on file  Physical Activity:   . Days of Exercise per Week: Not on file  . Minutes of Exercise per Session: Not on file  Stress:   . Feeling of Stress : Not on file  Social Connections:   . Frequency of Communication with Friends and Family: Not on file  . Frequency of Social Gatherings with Friends and Family: Not on file  . Attends Religious Services: Not on file  . Active Member of Clubs or Organizations: Not on file  . Attends Archivist Meetings: Not on file  . Marital Status: Not on file  Intimate Partner Violence:   . Fear of Current or Ex-Partner: Not on file  . Emotionally Abused: Not on  file  . Physically Abused: Not on file  . Sexually Abused: Not on file     BP (!) 108/59   Pulse 71   Temp 98.2 F (36.8 C)   Ht 5' 6"  (1.676 m)   Wt 172 lb 12.8 oz (78.4 kg)   BMI 27.89 kg/m   Physical Exam:  Well appearing 74 yo woman, NAD HEENT: Unremarkable Neck:  No JVD, no thyromegally Lymphatics:  No adenopathy Back:  No CVA tenderness Lungs:  Clear with no wheezes HEART:  Regular rate rhythm, 1/6 systolic murmur, no rubs, no clicks Abd:  soft, positive bowel sounds, no organomegally, no rebound, no guarding Ext:  2 plus pulses, no edema, no cyanosis, no clubbing Skin:  No rashes no nodules Neuro:  CN II through XII intact, motor grossly intact  EKG - nsr with LBBB, reviewed  Assess/Plan: 1. Chronic systolic heart failure - she has a longstanding LV dysfunction with an EF of 20%. She is on maximal medical therapy. I have discussed the indications for ICD insertion and she wishes to proceed. 2. LBBB - with this we will plan placement of an LV lead. 3. AS - she is doing well, s/p TAVR.  Mikle Bosworth.D.

## 2019-10-12 ENCOUNTER — Other Ambulatory Visit: Payer: Self-pay | Admitting: Family Medicine

## 2019-10-12 DIAGNOSIS — E785 Hyperlipidemia, unspecified: Secondary | ICD-10-CM

## 2019-10-17 ENCOUNTER — Telehealth: Payer: Self-pay

## 2019-10-17 NOTE — Telephone Encounter (Signed)
Pt has additional questions about defibrillator.  Please call [phone]  Thanks renee

## 2019-10-18 ENCOUNTER — Encounter: Payer: Self-pay | Admitting: *Deleted

## 2019-10-18 NOTE — Addendum Note (Signed)
Addended by: Levonne Hubert on: 10/18/2019 08:56 AM   Modules accepted: Orders

## 2019-10-18 NOTE — Telephone Encounter (Signed)
PPM placement scheduled for 10/28/19 @ 12 pm with [name]. Pt notified

## 2019-10-21 ENCOUNTER — Emergency Department (HOSPITAL_COMMUNITY): Payer: PPO

## 2019-10-21 ENCOUNTER — Emergency Department (HOSPITAL_COMMUNITY)
Admission: EM | Admit: 2019-10-21 | Discharge: 2019-10-21 | Disposition: A | Discharge status: Home / Self Care | Payer: PPO | Attending: Emergency Medicine | Admitting: Emergency Medicine

## 2019-10-21 ENCOUNTER — Other Ambulatory Visit: Payer: Self-pay

## 2019-10-21 ENCOUNTER — Encounter (HOSPITAL_COMMUNITY): Payer: Self-pay

## 2019-10-21 DIAGNOSIS — N183 Chronic kidney disease, stage 3 unspecified: Secondary | ICD-10-CM | POA: Insufficient documentation

## 2019-10-21 DIAGNOSIS — M1711 Unilateral primary osteoarthritis, right knee: Secondary | ICD-10-CM | POA: Diagnosis not present

## 2019-10-21 DIAGNOSIS — I130 Hypertensive heart and chronic kidney disease with heart failure and stage 1 through stage 4 chronic kidney disease, or unspecified chronic kidney disease: Secondary | ICD-10-CM | POA: Insufficient documentation

## 2019-10-21 DIAGNOSIS — E1122 Type 2 diabetes mellitus with diabetic chronic kidney disease: Secondary | ICD-10-CM | POA: Diagnosis not present

## 2019-10-21 DIAGNOSIS — I5042 Chronic combined systolic (congestive) and diastolic (congestive) heart failure: Secondary | ICD-10-CM | POA: Diagnosis not present

## 2019-10-21 DIAGNOSIS — M545 Low back pain: Secondary | ICD-10-CM | POA: Diagnosis not present

## 2019-10-21 DIAGNOSIS — Z79899 Other long term (current) drug therapy: Secondary | ICD-10-CM | POA: Insufficient documentation

## 2019-10-21 DIAGNOSIS — M25561 Pain in right knee: Secondary | ICD-10-CM | POA: Diagnosis not present

## 2019-10-21 DIAGNOSIS — Z794 Long term (current) use of insulin: Secondary | ICD-10-CM | POA: Diagnosis not present

## 2019-10-21 DIAGNOSIS — Z7982 Long term (current) use of aspirin: Secondary | ICD-10-CM | POA: Diagnosis not present

## 2019-10-21 DIAGNOSIS — M25461 Effusion, right knee: Secondary | ICD-10-CM | POA: Insufficient documentation

## 2019-10-21 MED ORDER — HYDROCODONE-ACETAMINOPHEN 5-325 MG PO TABS: 1.0000 | ORAL_TABLET | ORAL | 0 refills | Status: DC | PRN

## 2019-10-21 MED ORDER — HYDROCODONE-ACETAMINOPHEN 5-325 MG PO TABS
1.0000 | ORAL_TABLET | Freq: Once | ORAL | Status: AC
  Administered 2019-10-21: 1 via ORAL
  Filled 2019-10-21: qty 1

## 2019-10-21 NOTE — ED Triage Notes (Signed)
Pt presenting to ED with right knee pain. States the pain has been increasingly worse over past 4 days. Pt scheduled for pacemaker March 15. States knee pain must be controlled before pacemaker surgery. Pt crying during triage.

## 2019-10-21 NOTE — ED Provider Notes (Signed)
[facility] Provider Note   CSN: [id number] Arrival date & time: 10/21/19  4010     History Chief Complaint  Patient presents with  . Leg Pain    [name] [name] is a 74 y.o. female.  74 year old female presents with complaint of pain in her right knee.  Patient states that she has had pain for a while and this knee however has become progressively worse, not improving with hot compresses and wearing a knee sleeve.  Also reports pain in her right buttocks that radiates to her right knee.  Denies any falls or injuries.  No other complaints or concerns today.        Past Medical History:  Diagnosis Date  . Anxiety   . Aortic stenosis   . Arthritis   . Chronic combined systolic and diastolic CHF (congestive heart failure) ([place])   . CKD (chronic kidney disease) stage 3, GFR 30-59 ml/min   . Constipation   . Depression   . Diabetes mellitus, type 2 ([place])   . Essential hypertension, benign   . Hyperlipidemia   . Incidental pulmonary nodule, > 71m and < 854m   7 x 4 mm LLL nodule  . LBBB (left bundle branch block)   . Nonischemic cardiomyopathy (HCC)    No significant obstructive CAD at heart catheterization 05/2014, LVEF 20%  . Obesity   . Pneumonia 2013  . 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 Symptomatic PVCs     Patient Active Problem List   Diagnosis Date Noted  . Generalized osteoarthritis 07/26/2019  . Screening for depression 02/10/2018  . Osteoarthritis of left knee 03/16/2017  . Annual physical exam 08/18/2015  . 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 Severe aortic valve stenosis 03/24/2015  . Hyperlipidemia LDL goal <100 05/24/2014  . Severe aortic stenosis 05/23/2014  . Incidental pulmonary nodule, > 88m27mnd < 8mm31m/29/2015  . Chronic combined systolic and diastolic CHF (congestive heart  failure) (HCC)[place]. LBBB (left bundle branch block) 12/03/2013  . 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 Seasonal allergies 02/22/2012  . Essential hypertension, benign   . Nonischemic cardiomyopathy (HCC)[place]/08/2011  . 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 Overweight (BMI 25.0-29.9) 10/24/2007    Past Surgical History:  Procedure Laterality Date  . CARDIAC CATHETERIZATION    . CESAREAN SECTION  1987  . FLEXIBLE BRONCHOSCOPY N/A 01/14/2015   Procedure: FLEXIBLE BRONCHOSCOPY;  Surgeon: StevMelrose Nakayama;  [facility];  Laterality: N/A;  . LEFT AND RIGHT HEART CATHETERIZATION WITH CORONARY ANGIOGRAM N/A 12/05/2011   Procedure: LEFT AND RIGHT HEART CATHETERIZATION WITH CORONARY ANGIOGRAM;  Surgeon: ChriBurnell Blanks;  [facility];  Service: Cardiovascular;  Laterality: N/A;  . LEFT AND RIGHT HEART CATHETERIZATION WITH CORONARY ANGIOGRAM N/A 05/23/2014   Procedure: LEFT AND RIGHT HEART CATHETERIZATION WITH CORONARY ANGIOGRAM;  Surgeon: ChriBurnell Blanks;  [facility];  Service: Cardiovascular;  Laterality: N/A;  . MULTIPLE EXTRACTIONS WITH ALVEOLOPLASTY N/A 02/27/2014   Procedure: Extraction of tooth #'s 2,4,5,6,7,8,9,10,11,12, 22, 23, 24, 25, 26, 28 with alveoloplasty and bilateral mandibular tori reductions;  Surgeon: RonaLenn CalS;  [facility] Surgery;  Laterality: N/A;  . RIGID BRONCHOSCOPY N/A 01/14/2015   Procedure: RIGID  BRONCHOSCOPY with Removal Of Foreign Body ;  Surgeon: [name], MD;  Location: [facility];  Service: Thoracic;  Laterality: N/A;  . TEE WITHOUT CARDIOVERSION N/A 03/24/2015   Procedure: TRANSESOPHAGEAL ECHOCARDIOGRAM (TEE);  Surgeon: [name], MD;  Location: [facility];  Service: Open Heart Surgery;  Laterality: N/A;  . TRANSCATHETER AORTIC VALVE REPLACEMENT, TRANSFEMORAL Bilateral 03/24/2015   Procedure: TRANSCATHETER  AORTIC VALVE REPLACEMENT, TRANSFEMORAL;  Surgeon: [name], MD;  Location: [facility];  Service: Open Heart Surgery;  Laterality: Bilateral;  . TUBAL LIGATION  1987  . VIDEO BRONCHOSCOPY Bilateral 06/05/2014   Procedure: VIDEO BRONCHOSCOPY WITHOUT FLUORO;  Surgeon: [name], MD;  Location[facility] ENDOSCOPY;  Service: Cardiopulmonary;  Laterality: Bilateral;     OB History   No obstetric history on file.     Family History  Problem Relation Age of Onset  . Heart disease Mother   . Heart attack Mother 45  . Diabetes Mother   . Hypertension Mother   . Colon cancer Father 16  . Breast cancer Sister   . Diabetes Brother   . Hypertension Brother   . Stroke Brother   . Cancer Brother   . Congestive Heart Failure Daughter   . Hypertension Son   . High Cholesterol Son   . Congestive Heart Failure Son     Social History   Tobacco Use  . Smoking status: Never Smoker  . Smokeless tobacco: Never Used  Substance Use Topics  . Alcohol use: No  . Drug use: No    Home Medications Prior to Admission medications   Medication Sig Start Date End Date Taking? Authorizing Provider  acetaminophen (TYLENOL) 500 MG tablet Take one tablet two times daily, by mouth, for knee  pain 07/18/19   Fayrene Helper, MD  aspirin EC 81 MG tablet Take 81 mg by mouth every morning.     [provider]  blood glucose meter kit and supplies Dispense based on patient and insurance preference. Use to test three times daily (FOR ICD-10 E10.9, E11.9). 05/07/19   Fayrene Helper, MD  carvedilol (COREG) 25 MG tablet TAKE 1 TABLET BY MOUTH TWICE DAILY WITH MEALS. 05/03/19   Arnoldo Lenis, MD  ezetimibe (ZETIA) 10 MG tablet TAKE ONE TABLET BY MOUTH ONCE DAILY. 10/14/19   Fayrene Helper, MD  fenofibrate (TRICOR) 145 MG tablet TAKE (1) TABLET BY MOUTH ONCE DAILY. 10/14/19   Fayrene Helper, MD  fluticasone (FLONASE) 50 MCG/ACT nasal spray Place 2 sprays into both nostrils  daily. Patient taking differently: Place 2 sprays into both nostrils daily as needed for allergies or rhinitis.  12/06/17   Fayrene Helper, MD  furosemide (LASIX) 40 MG tablet TAKE (1) TABLET BY MOUTH EACH MORNING. 10/07/19   Fayrene Helper, MD  glipiZIDE (GLIPIZIDE XL) 5 MG 24 hr tablet Take 1 tablet (5 mg total) by mouth daily with breakfast. 07/18/19   Fayrene Helper, MD  HYDROcodone-acetaminophen (NORCO/VICODIN) 5-325 MG tablet Take 1 tablet by mouth every 4 (four) hours as needed. 10/21/19   Tacy Learn, PA-C  insulin aspart protamine - aspart (NOVOLOG MIX 70/30 FLEXPEN) (70-30) 100 UNIT/ML FlexPen INJECT 12 TO 15 UNITS SUBCUTANEOUSLY TWICE DAILY. 09/09/19   Fayrene Helper, MD  Insulin Pen Needle (LITETOUCH PEN NEEDLES) 31G X 8 MM MISC USE AS NEEDED TO INJECT INSULIN. 07/18/19   Fayrene Helper, MD  Lancets [facility] DELICA PLUS [id number]) MISC USE 3 TIMES A DAY AS DIRECTED.  10/01/19   Fayrene Helper, MD  lidocaine-prilocaine (EMLA) cream Apply 1-2 application topically 3 (three) times daily as needed. 08/01/17   [provider]  LIVALO 2 MG TABS TAKE ONE TABLET BY MOUTH ONCE DAILY AFTER SUPPER. 08/14/19   Fayrene Helper, MD  Multiple Vitamins-Minerals (MULTIVITAMIN PO) Take 1 tablet by mouth every morning.     [provider]  niacin (NIASPAN) 500 MG CR tablet TAKE (1) TABLET BY MOUTH AT BEDTIME. 10/14/19   Fayrene Helper, MD  [facility] ULTRA test strip CHECK BLOOD SUGAR 3 TIMES A DAY. 08/02/19   Fayrene Helper, MD  potassium chloride (KLOR-CON) 10 MEQ tablet TAKE ONE TABLET BY MOUTH DAILY. DO NOT TAKE IF NOT TAKING FUROSEMIDE. 10/14/19   Fayrene Helper, MD  spironolactone (ALDACTONE) 25 MG tablet Take 1 tablet (25 mg total) by mouth daily. 07/18/19   Fayrene Helper, MD  UNABLE TO FIND Shower bench x 1   DX X44.81 05/02/19   Fayrene Helper, MD  UNABLE TO FIND Rolling walker with seat x 1  DX m17.12 05/02/19   Fayrene Helper, MD  cetirizine (ZYRTEC) 10 MG tablet Take 1 tablet (10 mg total) by mouth daily. 06/30/11 04/19/18  Fayrene Helper, MD  ferrous sulfate 325 (65 FE) MG EC tablet Take 1 tablet (325 mg total) by mouth 2 (two) times daily. 07/14/11 10/28/11  Fayrene Helper, MD  FLUoxetine (PROZAC) 10 MG tablet Take 1 tablet (10 mg total) by mouth daily. Take one capsule by mouth once a day 12/20/10 11/03/11  Fayrene Helper, MD    Allergies    Crestor [rosuvastatin], Tramadol, and Penicillins  Review of Systems   Review of Systems  Constitutional: Negative for fever.  Respiratory: Negative for shortness of breath.   Cardiovascular: Negative for chest pain.  Musculoskeletal: Positive for arthralgias, back pain and gait problem.  Skin: Negative for color change, rash and wound.  Allergic/Immunologic: Positive for immunocompromised state.  Neurological: Negative for weakness and numbness.  All other systems reviewed and are negative.   Physical Exam Updated Vital Signs BP 113/67 (BP Location: Right Arm)   Pulse 76   Temp 99.8 F (37.7 C) (Oral)   Ht 5' 6"  (1.676 m)   Wt 78 kg   SpO2 97%   BMI 27.75 kg/m   Physical Exam Vitals and nursing note reviewed.  Constitutional:      General: She is not in acute distress.    Appearance: She is well-developed. She is not diaphoretic.  HENT:     Head: Normocephalic and atraumatic.  Cardiovascular:     Pulses: Normal pulses.  Pulmonary:     Effort: Pulmonary effort is normal.  Musculoskeletal:        General: Tenderness present.       Back:     Right knee: Decreased range of motion. Tenderness present over the medial joint line.     Right lower leg: No edema.     Left lower leg: No edema.  Skin:    General: Skin is warm and dry.     Findings: No erythema or rash.  Neurological:     Mental Status: She is alert and oriented to person, place, and time.  Psychiatric:        Behavior: Behavior normal.     ED Results / Procedures /  Treatments   Labs (all labs ordered are listed, but only abnormal results are displayed) Labs Reviewed - No data  to display  EKG EKG Interpretation  Date/Time:  Monday October 21 2019 09:31:16 EST Ventricular Rate:  80 PR Interval:    QRS Duration: 160 QT Interval:  451 QTC Calculation: 521 R Axis:   -4 Text Interpretation: Sinus rhythm Consider right atrial enlargement Left bundle branch block Baseline wander in lead(s) V3 No significant change since last tracing Confirmed by Dorie Rank [phone]) on 10/21/2019 9:33:48 AM   Radiology DG Lumbar Spine Complete  Result Date: 10/21/2019 CLINICAL DATA:  Acute right low back pain without known injury. EXAM: LUMBAR SPINE - COMPLETE 4+ VIEW COMPARISON:  None. FINDINGS: No fracture is noted. Minimal grade 1 anterolisthesis is noted at L4-5 and L5-S1 secondary to posterior facet joint hypertrophy. Disc spaces are well-maintained. Mild diffuse osteopenia is noted. IMPRESSION: Mild degenerative changes as described above. No acute abnormality seen in the lumbar spine. Electronically Signed   By: Marijo Conception M.D.   On: 10/21/2019 10:58   DG Knee Complete 4 Views Right  Result Date: 10/21/2019 CLINICAL DATA:  Acute right knee pain without known injury. EXAM: RIGHT KNEE - COMPLETE 4+ VIEW COMPARISON:  July 08, 2018. FINDINGS: No fracture or dislocation is noted. Moderate narrowing of medial joint space is noted with osteophyte formation. No soft tissue abnormality is noted. Mild suprapatellar joint effusion is noted. IMPRESSION: Moderate degenerative joint disease is noted medially. Mild suprapatellar joint effusion is noted. No fracture or dislocation is noted. Electronically Signed   By: Marijo Conception M.D.   On: 10/21/2019 10:55    Procedures Procedures (including critical care time)  Medications Ordered in ED Medications  HYDROcodone-acetaminophen (NORCO/VICODIN) 5-325 MG per tablet 1 tablet (1 tablet Oral Given 10/21/19 0947)    ED Course   I have reviewed the triage vital signs and the nursing notes.  Pertinent labs & imaging results that were available during my care of the patient were reviewed by me and considered in my medical decision making (see chart for details).  Clinical Course as of Oct 21 1126  Mon Oct 20, 2825  7521 74 year old female with complaint of right knee pain without injury.  Also has pain right SI area.  On exam patient has tenderness to the medial joint line right knee, pain worse with flexion of the knee, allows for limited passive flexion, no pain with extension.  No erythema, no appreciable effusion.  Also soreness right SI joint, no pain with logroll of right hip.   [LM]  1093 X-ray lumbar spine due to right SI joint tenderness shows mild degenerative changes.  X-ray right knee with moderate degenerative joint disease noted medially and a mild suprapatellar effusion. Plan is to refer to orthopedics for further evaluation.  Patient given prescription for Norco to take for pain as needed as prescribed. EKG completed while in the emergency room due to patient's report of pending pacemaker surgery on March 15.  Patient does not have any complaints related to chest pain, shortness of breath, weakness today.   [LM]    Clinical Course User Index [LM] Roque Lias   MDM Rules/Calculators/A&P                     Final Clinical Impression(s) / ED Diagnoses Final diagnoses:  Knee effusion, right  Primary osteoarthritis of right knee    Rx / DC Orders ED Discharge Orders         Ordered    HYDROcodone-acetaminophen (NORCO/VICODIN) 5-325 MG tablet  Every 4 hours PRN,  Status:  Discontinued     10/21/19 1120    HYDROcodone-acetaminophen (NORCO/VICODIN) 5-325 MG tablet  Every 4 hours PRN     10/21/19 1121           Roque Lias 10/21/19 1128    Dorie Rank, MD 10/21/19 1538

## 2019-10-21 NOTE — Discharge Instructions (Addendum)
Take Norco as needed as prescribed for pain.  You can continue to wear your knee sleeve and apply warm compresses to the knee as needed.  Contact orthopedics today to schedule follow-up appointment.

## 2019-10-21 NOTE — ED Notes (Signed)
Pt to xray via stretcher

## 2019-10-23 ENCOUNTER — Other Ambulatory Visit: Payer: Self-pay

## 2019-10-23 ENCOUNTER — Encounter: Payer: Self-pay | Admitting: Orthopedic Surgery

## 2019-10-23 ENCOUNTER — Ambulatory Visit: Payer: PPO | Admitting: Orthopedic Surgery

## 2019-10-23 VITALS — BP 115/71 | HR 86 | Temp 97.4°F | Ht 66.0 in | Wt 172.0 lb

## 2019-10-23 DIAGNOSIS — M1710 Unilateral primary osteoarthritis, unspecified knee: Secondary | ICD-10-CM

## 2019-10-23 DIAGNOSIS — M1711 Unilateral primary osteoarthritis, right knee: Secondary | ICD-10-CM

## 2019-10-23 DIAGNOSIS — M5410 Radiculopathy, site unspecified: Secondary | ICD-10-CM | POA: Diagnosis not present

## 2019-10-23 DIAGNOSIS — M25461 Effusion, right knee: Secondary | ICD-10-CM | POA: Diagnosis not present

## 2019-10-23 MED ORDER — IBUPROFEN 800 MG PO TABS: 800.0000 mg | ORAL_TABLET | Freq: Three times a day (TID) | ORAL | 1 refills | Status: DC | PRN

## 2019-10-23 MED ORDER — GABAPENTIN 100 MG PO CAPS: 100.0000 mg | ORAL_CAPSULE | Freq: Three times a day (TID) | ORAL | 2 refills | Status: DC

## 2019-10-23 NOTE — Patient Instructions (Addendum)
You have 2 problems right now [name]  #1 joint effusion swelling arthritis right knee  #2 inflammation nerve right leg with arthritis of your back  We took the fluid out we put some medication to decrease the inflammation in the knee and put you on a medication ibuprofen to help with pain  We also put you on a new medication to help with the nerve irritation called gabapentin

## 2019-10-23 NOTE — Progress Notes (Signed)
Chief Complaint  Patient presents with  . Knee Pain    ER follow up on right knee.    74 year old female presents after going to the emergency room on March 8 with several day history of right knee pain and swelling which progressed to include right hip lower back right leg pain radiating into her right foot.  After x-rays of the knee showed arthritis in the back showed arthritis she was sent here for follow-up.  She did take some hydrocodone for pain but she still has significant pain loss of motion in the knee swelling warmth and a significant limp  Review of systems she has a severe heart disease some muscle aches of back pain joint pain no fever or chills  Past Medical History:  Diagnosis Date  . Anxiety   . Aortic stenosis   . Arthritis   . Chronic combined systolic and diastolic CHF (congestive heart failure) ([place])   . CKD (chronic kidney disease) stage 3, GFR 30-59 ml/min   . Constipation   . Depression   . Diabetes mellitus, type 2 ([place])   . Essential hypertension, benign   . Hyperlipidemia   . Incidental pulmonary nodule, > 110mm and < 52mm    7 x 4 mm LLL nodule  . LBBB (left bundle branch block)   . Nonischemic cardiomyopathy (HCC)    No significant obstructive CAD at heart catheterization 05/2014, LVEF 20%  . Obesity   . Pneumonia 2013  . 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 Symptomatic PVCs     BP 115/71   Pulse 86   Temp (!) 97.4 F (36.3 C)   Ht 5\' 6"  (1.676 m)   Wt 172 lb (78 kg)   BMI 27.76 kg/m  She appears to be in in distress she has a small frame she is oriented to person place and time her mood is somewhat depressed her affect is flat her gait is remarked for a limp and she is using a cane  The right knee is warm compared to the left the right knee is swollen with a joint effusion has a restricted range of motion but feels stable muscle  tone is normal skin is intact there is no rashes a good distal pulse normal sensation no lymphadenopathy in the groin  Her back is tender her hip range of motion was normal  Encounter Diagnoses  Name Primary?  . Radicular leg pain Yes  . Effusion, right knee   . Primary localized osteoarthritis of knee     The lumbar x-ray was independently reviewed and I included the report below she does indeed have an anterolisthesis at 4 5 and 5 S1 with arthritis without significant disc space narrowing  As far as the knee goes she has a varus knee fairly significant joint space narrowing medially with subchondral bone sclerosis and cyst formation and varus alignment to the joint   X-ray reports Lumbar x-ray  FINDINGS: No fracture is noted. Minimal grade 1 anterolisthesis is noted at L4-5 and L5-S1 secondary to posterior facet joint hypertrophy. Disc spaces are well-maintained. Mild diffuse osteopenia is noted.  IMPRESSION: Mild degenerative changes as described above. No acute abnormality seen in the lumbar spine.   Electronically Signed   By: Marijo Conception M.D.   On: 10/21/2019 10:58  Right knee x-ray FINDINGS: No fracture or dislocation is noted. Moderate narrowing of medial joint  space is noted with osteophyte formation. No soft tissue abnormality is noted. Mild suprapatellar joint effusion is noted.   IMPRESSION: Moderate degenerative joint disease is noted medially. Mild suprapatellar joint effusion is noted. No fracture or dislocation is noted.     Electronically Signed   By: Marijo Conception M.D.   On: 10/21/2019 10:55     #1 I am recommending a joint aspiration and injection  Procedure note injection and aspiration right knee joint  Verbal consent was obtained to aspirate and inject the right knee joint   Timeout was completed to confirm the site of aspiration and injection  An 18-gauge needle was used to aspirate the knee joint from a suprapatellar lateral  approach.  The medications used were 40 mg of Depo-Medrol and 1% lidocaine 3 cc  Anesthesia was provided by ethyl chloride and the skin was prepped with alcohol.  After cleaning the skin with alcohol an 18-gauge needle was used to aspirate the right knee joint.  We obtained 60 cc of yellow fluid cc of fluid clear yellow  We follow this by injection of 40 mg of Depo-Medrol and 3 cc 1% lidocaine.  There were no complications. A sterile bandage was applied.  #2 I  would like to start her on some gabapentin and ibuprofen as well    Encounter Diagnoses  Name Primary?  . Radicular leg pain Yes  . Effusion, right knee   . Primary localized osteoarthritis of knee    Meds ordered this encounter  Medications  . gabapentin (NEURONTIN) 100 MG capsule    Sig: Take 1 capsule (100 mg total) by mouth 3 (three) times daily.    Dispense:  90 capsule    Refill:  2  . ibuprofen (ADVIL) 800 MG tablet    Sig: Take 1 tablet (800 mg total) by mouth every 8 (eight) hours as needed.    Dispense:  90 tablet    Refill:  1   We did arrange a follow-up for her  Acute illness with involvement of other systems, independent x-ray interpretations prescription management

## 2019-10-25 ENCOUNTER — Other Ambulatory Visit (HOSPITAL_COMMUNITY)
Admission: RE | Admit: 2019-10-25 | Discharge: 2019-10-25 | Disposition: A | Discharge status: Home / Self Care | Payer: PPO | Source: Ambulatory Visit | Attending: Internal Medicine | Admitting: Internal Medicine

## 2019-10-25 ENCOUNTER — Other Ambulatory Visit: Payer: Self-pay

## 2019-10-25 ENCOUNTER — Inpatient Hospital Stay (HOSPITAL_COMMUNITY): Admission: RE | Admit: 2019-10-25 | Payer: PPO | Source: Ambulatory Visit

## 2019-10-25 DIAGNOSIS — I5042 Chronic combined systolic (congestive) and diastolic (congestive) heart failure: Secondary | ICD-10-CM | POA: Insufficient documentation

## 2019-10-25 LAB — CBC WITH DIFFERENTIAL/PLATELET
Abs Immature Granulocytes: 0.02 10*3/uL (ref 0.00–0.07)
Basophils Absolute: 0.1 10*3/uL (ref 0.0–0.1)
Basophils Relative: 1 %
Eosinophils Absolute: 0.1 10*3/uL (ref 0.0–0.5)
Eosinophils Relative: 2 %
HCT: 36.3 % (ref 36.0–46.0)
Hemoglobin: 11.6 g/dL — ABNORMAL LOW (ref 12.0–15.0)
Immature Granulocytes: 0 %
Lymphocytes Relative: 18 %
Lymphs Abs: 1.3 10*3/uL (ref 0.7–4.0)
MCH: 27.8 pg (ref 26.0–34.0)
MCHC: 32 g/dL (ref 30.0–36.0)
MCV: 86.8 fL (ref 80.0–100.0)
Monocytes Absolute: 0.7 10*3/uL (ref 0.1–1.0)
Monocytes Relative: 9 %
Neutro Abs: 5.3 10*3/uL (ref 1.7–7.7)
Neutrophils Relative %: 70 %
Platelets: 309 10*3/uL (ref 150–400)
RBC: 4.18 MIL/uL (ref 3.87–5.11)
RDW: 14.6 % (ref 11.5–15.5)
WBC: 7.5 10*3/uL (ref 4.0–10.5)
nRBC: 0 % (ref 0.0–0.2)

## 2019-10-25 LAB — BASIC METABOLIC PANEL
Anion gap: 12 (ref 5–15)
BUN: 55 mg/dL — ABNORMAL HIGH (ref 8–23)
CO2: 23 mmol/L (ref 22–32)
Calcium: 9.7 mg/dL (ref 8.9–10.3)
Chloride: 104 mmol/L (ref 98–111)
Creatinine, Ser: 1.78 mg/dL — ABNORMAL HIGH (ref 0.44–1.00)
GFR calc Af Amer: 32 mL/min — ABNORMAL LOW (ref >60)
GFR calc non Af Amer: 28 mL/min — ABNORMAL LOW (ref >60)
Glucose, Bld: 194 mg/dL — ABNORMAL HIGH (ref 70–99)
Potassium: 4 mmol/L (ref 3.5–5.1)
Sodium: 139 mmol/L (ref 135–145)

## 2019-10-28 ENCOUNTER — Ambulatory Visit (HOSPITAL_COMMUNITY)
Admission: RE | Admit: 2019-10-28 | Discharge: 2019-10-28 | Disposition: A | Discharge status: Home / Self Care | Payer: PPO | Attending: Internal Medicine | Admitting: Internal Medicine

## 2019-10-28 ENCOUNTER — Ambulatory Visit (HOSPITAL_COMMUNITY): Payer: PPO

## 2019-10-28 ENCOUNTER — Ambulatory Visit (HOSPITAL_COMMUNITY)
Admission: RE | Disposition: A | Discharge status: Home / Self Care | Payer: PPO | Source: Home / Self Care | Attending: Internal Medicine

## 2019-10-28 ENCOUNTER — Other Ambulatory Visit: Payer: Self-pay

## 2019-10-28 DIAGNOSIS — I5042 Chronic combined systolic (congestive) and diastolic (congestive) heart failure: Secondary | ICD-10-CM | POA: Insufficient documentation

## 2019-10-28 DIAGNOSIS — N183 Chronic kidney disease, stage 3 unspecified: Secondary | ICD-10-CM | POA: Diagnosis not present

## 2019-10-28 DIAGNOSIS — Z7982 Long term (current) use of aspirin: Secondary | ICD-10-CM | POA: Insufficient documentation

## 2019-10-28 DIAGNOSIS — I429 Cardiomyopathy, unspecified: Secondary | ICD-10-CM | POA: Insufficient documentation

## 2019-10-28 DIAGNOSIS — I5022 Chronic systolic (congestive) heart failure: Secondary | ICD-10-CM | POA: Diagnosis not present

## 2019-10-28 DIAGNOSIS — Z885 Allergy status to narcotic agent status: Secondary | ICD-10-CM | POA: Insufficient documentation

## 2019-10-28 DIAGNOSIS — Z950 Presence of cardiac pacemaker: Secondary | ICD-10-CM | POA: Diagnosis not present

## 2019-10-28 DIAGNOSIS — E669 Obesity, unspecified: Secondary | ICD-10-CM | POA: Diagnosis not present

## 2019-10-28 DIAGNOSIS — F329 Major depressive disorder, single episode, unspecified: Secondary | ICD-10-CM | POA: Insufficient documentation

## 2019-10-28 DIAGNOSIS — Z880 Allergy status to penicillin: Secondary | ICD-10-CM | POA: Diagnosis not present

## 2019-10-28 DIAGNOSIS — F419 Anxiety disorder, unspecified: Secondary | ICD-10-CM | POA: Diagnosis not present

## 2019-10-28 DIAGNOSIS — Z888 Allergy status to other drugs, medicaments and biological substances status: Secondary | ICD-10-CM | POA: Diagnosis not present

## 2019-10-28 DIAGNOSIS — Z006 Encounter for examination for normal comparison and control in clinical research program: Secondary | ICD-10-CM | POA: Insufficient documentation

## 2019-10-28 DIAGNOSIS — I428 Other cardiomyopathies: Secondary | ICD-10-CM | POA: Diagnosis not present

## 2019-10-28 DIAGNOSIS — E785 Hyperlipidemia, unspecified: Secondary | ICD-10-CM | POA: Insufficient documentation

## 2019-10-28 DIAGNOSIS — Z8249 Family history of ischemic heart disease and other diseases of the circulatory system: Secondary | ICD-10-CM | POA: Diagnosis not present

## 2019-10-28 DIAGNOSIS — Z833 Family history of diabetes mellitus: Secondary | ICD-10-CM | POA: Insufficient documentation

## 2019-10-28 DIAGNOSIS — Z95810 Presence of automatic (implantable) cardiac defibrillator: Secondary | ICD-10-CM

## 2019-10-28 DIAGNOSIS — Z79899 Other long term (current) drug therapy: Secondary | ICD-10-CM | POA: Insufficient documentation

## 2019-10-28 DIAGNOSIS — Z952 Presence of prosthetic heart valve: Secondary | ICD-10-CM | POA: Diagnosis not present

## 2019-10-28 DIAGNOSIS — Z6827 Body mass index (BMI) 27.0-27.9, adult: Secondary | ICD-10-CM | POA: Insufficient documentation

## 2019-10-28 DIAGNOSIS — I130 Hypertensive heart and chronic kidney disease with heart failure and stage 1 through stage 4 chronic kidney disease, or unspecified chronic kidney disease: Secondary | ICD-10-CM | POA: Diagnosis not present

## 2019-10-28 DIAGNOSIS — E1122 Type 2 diabetes mellitus with diabetic chronic kidney disease: Secondary | ICD-10-CM | POA: Insufficient documentation

## 2019-10-28 DIAGNOSIS — Z794 Long term (current) use of insulin: Secondary | ICD-10-CM | POA: Insufficient documentation

## 2019-10-28 DIAGNOSIS — I350 Nonrheumatic aortic (valve) stenosis: Secondary | ICD-10-CM | POA: Diagnosis not present

## 2019-10-28 DIAGNOSIS — I447 Left bundle-branch block, unspecified: Secondary | ICD-10-CM | POA: Insufficient documentation

## 2019-10-28 HISTORY — PX: BIV ICD INSERTION CRT-D: EP1195

## 2019-10-28 LAB — GLUCOSE, CAPILLARY
Glucose-Capillary: 108 mg/dL — ABNORMAL HIGH (ref 70–99)
Glucose-Capillary: 355 mg/dL — ABNORMAL HIGH (ref 70–99)

## 2019-10-28 SURGERY — BIV ICD INSERTION CRT-D

## 2019-10-28 MED ORDER — IOHEXOL 350 MG/ML SOLN
INTRAVENOUS | Status: DC | PRN
  Administered 2019-10-28: 13:00:00 15 mL

## 2019-10-28 MED ORDER — ONDANSETRON HCL 4 MG/2ML IJ SOLN: 4.0000 mg | Freq: Four times a day (QID) | INTRAMUSCULAR | Status: DC | PRN

## 2019-10-28 MED ORDER — LIDOCAINE HCL (PF) 1 % IJ SOLN
INTRAMUSCULAR | Status: DC | PRN
  Administered 2019-10-28: 60 mL

## 2019-10-28 MED ORDER — MIDAZOLAM HCL 5 MG/5ML IJ SOLN
INTRAMUSCULAR | Status: DC | PRN
  Administered 2019-10-28 (×4): 1 mg via INTRAVENOUS

## 2019-10-28 MED ORDER — VANCOMYCIN HCL IN DEXTROSE 1-5 GM/200ML-% IV SOLN: 1000.0000 mg | Freq: Once | INTRAVENOUS | Status: DC

## 2019-10-28 MED ORDER — VANCOMYCIN HCL IN DEXTROSE 1-5 GM/200ML-% IV SOLN
1000.0000 mg | Freq: Once | INTRAVENOUS | Status: AC
  Administered 2019-10-28: 1000 mg via INTRAVENOUS
  Filled 2019-10-28: qty 200

## 2019-10-28 MED ORDER — SODIUM CHLORIDE 0.9 % IV SOLN
80.0000 mg | INTRAVENOUS | Status: AC
  Administered 2019-10-28 (×2): 80 mg
  Filled 2019-10-28: qty 2

## 2019-10-28 MED ORDER — HEPARIN (PORCINE) IN NACL 1000-0.9 UT/500ML-% IV SOLN
INTRAVENOUS | Status: AC
  Filled 2019-10-28: qty 500

## 2019-10-28 MED ORDER — ACETAMINOPHEN 325 MG PO TABS: 325.0000 mg | ORAL_TABLET | ORAL | Status: DC | PRN

## 2019-10-28 MED ORDER — MIDAZOLAM HCL 5 MG/5ML IJ SOLN
INTRAMUSCULAR | Status: AC
  Filled 2019-10-28: qty 5

## 2019-10-28 MED ORDER — FENTANYL CITRATE (PF) 100 MCG/2ML IJ SOLN
INTRAMUSCULAR | Status: AC
  Filled 2019-10-28: qty 2

## 2019-10-28 MED ORDER — SODIUM CHLORIDE 0.9 % IV SOLN
INTRAVENOUS | Status: AC
  Filled 2019-10-28: qty 2

## 2019-10-28 MED ORDER — VANCOMYCIN HCL IN DEXTROSE 1-5 GM/200ML-% IV SOLN
INTRAVENOUS | Status: AC
  Filled 2019-10-28: qty 200

## 2019-10-28 MED ORDER — VANCOMYCIN HCL IN DEXTROSE 1-5 GM/200ML-% IV SOLN
1000.0000 mg | INTRAVENOUS | Status: AC
  Administered 2019-10-28: 1000 mg via INTRAVENOUS
  Filled 2019-10-28: qty 200

## 2019-10-28 MED ORDER — LIDOCAINE HCL 1 % IJ SOLN
INTRAMUSCULAR | Status: AC
  Filled 2019-10-28: qty 60

## 2019-10-28 MED ORDER — FENTANYL CITRATE (PF) 100 MCG/2ML IJ SOLN
INTRAMUSCULAR | Status: DC | PRN
  Administered 2019-10-28: 25 ug via INTRAVENOUS
  Administered 2019-10-28 (×2): 12.5 ug via INTRAVENOUS

## 2019-10-28 MED ORDER — HEPARIN (PORCINE) IN NACL 1000-0.9 UT/500ML-% IV SOLN
INTRAVENOUS | Status: DC | PRN
  Administered 2019-10-28: 500 mL

## 2019-10-28 MED ORDER — CHLORHEXIDINE GLUCONATE 4 % EX LIQD
4.0000 "application " | Freq: Once | CUTANEOUS | Status: DC
  Filled 2019-10-28: qty 60

## 2019-10-28 MED ORDER — SODIUM CHLORIDE 0.9 % IV SOLN: INTRAVENOUS | Status: DC

## 2019-10-28 SURGICAL SUPPLY — 15 items
CABLE SURGICAL S-101-97-12 (CABLE) ×2 IMPLANT
CATH ATTAIN COM SURV 6250V-MB2 (CATHETERS) ×2 IMPLANT
CATH HEX JOSEPH 2-5-2 65CM 6F (CATHETERS) ×2 IMPLANT
ICD CLARIA MRI DTMA1Q1 (ICD Generator) ×2 IMPLANT
LEAD ATTAIN PERFORMA S 4598-88 (Lead) ×2 IMPLANT
LEAD CAPSURE NOVUS 5076-52CM (Lead) ×2 IMPLANT
LEAD SPRINT QUAT SEC 6935-65CM (Lead) ×2 IMPLANT
PAD PRO RADIOLUCENT 2001M-C (PAD) ×2 IMPLANT
SHEATH 7FR PRELUDE SNAP 13 (SHEATH) ×2 IMPLANT
SHEATH 9.5FR PRELUDE SNAP 13 (SHEATH) ×2 IMPLANT
SHEATH 9FR PRELUDE SNAP 13 (SHEATH) ×2 IMPLANT
SLITTER 6232ADJ (MISCELLANEOUS) ×2 IMPLANT
TRAY PACEMAKER INSERTION (PACKS) ×2 IMPLANT
WIRE ACUITY WHISPER EDS 4648 (WIRE) ×2 IMPLANT
WIRE MAILMAN 182CM (WIRE) ×2 IMPLANT

## 2019-10-28 NOTE — Progress Notes (Signed)
Dr Lovena Le in to see patient.  Removed pressure dressing with steri strips underneath intact.  States ok for patient to leave now.

## 2019-10-28 NOTE — Interval H&P Note (Signed)
History and Physical Interval Note:  10/28/2019 11:19 AM  [name] [name]  has presented today for surgery, with the diagnosis of Cardiomyopathy, left bundle branch black.  The various methods of treatment have been discussed with the patient and family. After consideration of risks, benefits and other options for treatment, the patient has consented to  Procedure(s): BIV ICD INSERTION CRT-D (N/A) as a surgical intervention.  The patient's history has been reviewed, patient examined, no change in status, stable for surgery.  I have reviewed the patient's chart and labs.  Questions were answered to the patient's satisfaction.     Cristopher Peru

## 2019-10-28 NOTE — Progress Notes (Signed)
Post procedure teaching was completed with the patient She feels well, denies any CP, SOB. Post implant follow up is in place  CXR/device check are pending Anticipate discharge once completed and cleared by [name] later today  [name] Standard, PA-C

## 2019-10-28 NOTE — Discharge Instructions (Signed)
Tuesday, 10/29/2019, PLEASE SEND A REMOTE DEVICE TRANSMISSION    Supplemental Discharge Instructions for  Pacemaker/Defibrillator Patients  Activity No heavy lifting or vigorous activity with your left arm for 6 to 8 weeks.  Do not raise your left arm above your head for one week.  Gradually raise your affected arm as drawn below.             11/01/2019                 11/02/2019                11/03/2019              11/04/2019 __  NO DRIVING (patient does no drive).  WOUND CARE - Keep the wound area clean and dry.  Do not get this area wet for one week. No showers for one week; you may shower on 11/04/2019. - Tuesday, 10/29/2019: remove the arm sling - Tuesday 10/29/2019, remove outer plastic bandage, there are steri-strips (small paper tapes) underneath covering the wound, DO NOT REMOVE THESE - The tape/steri-strips on your wound will fall off; do not pull them off.  No bandage is needed on the site.  DO  NOT apply any creams, oils, or ointments to the wound area. - If you notice any drainage or discharge from the wound, any swelling or bruising at the site, or you develop a fever > 101? F after you are discharged home, call the office at once.  Special Instructions - You are still able to use cellular telephones; use the ear opposite the side where you have your pacemaker/defibrillator.  Avoid carrying your cellular phone near your device. - When traveling through airports, show security personnel your identification card to avoid being screened in the metal detectors.  Ask the security personnel to use the hand wand. - Avoid arc welding equipment, MRI testing (magnetic resonance imaging), TENS units (transcutaneous nerve stimulators).  Call the office for questions about other devices. - Avoid electrical appliances that are in poor condition or are not properly grounded. - Microwave ovens are safe to be near or to operate.  Additional information for defibrillator patients should your  device go off: - If your device goes off ONCE and you feel fine afterward, notify the device clinic nurses. - If your device goes off ONCE and you do not feel well afterward, call 911. - If your device goes off TWICE, call 911. - If your device goes off THREE times in one day, call 911.  DO NOT DRIVE YOURSELF OR A FAMILY MEMBER WITH A DEFIBRILLATOR TO THE HOSPITAL--CALL 911. After Your Pacemaker   . You have a Medtronic Pacemaker  ACTIVITY . Do not lift your arm above shoulder height for 1 week after your procedure. After 7 days, you may progress as below.     Monday November 04, 2019    Tuesday November 05, 2019 Wednesday November 06, 2019 Thursday November 07, 2019    . Do not lift, push, pull, or carry anything over 10 pounds with the affected arm until 6 weeks (Monday December 09, 2019 ) after your procedure.   . Do NOT DRIVE until you have been seen for your wound check, or as long as instructed by your healthcare provider.   . Ask your healthcare provider when you can go back to work   INCISION/Dressing . If you are on a blood thinner such as Coumadin, Xarelto, Eliquis, Plavix, or Pradaxa please confirm with your  provider when this should be resumed. tomorrow  . Monitor your Pacemaker site for redness, swelling, and drainage. Call the device clinic at [phone] if you experience these symptoms or fever/chills.  . Remove bul    . Avoid lotions, ointments, or perfumes over your incision until it is well-healed.  . You may use a hot tub or a pool AFTER your wound check appointment if the incision is completely closed.  Marland [employer] PAcemaker Alerts:  Some alerts are vibratory and others beep. These are NOT emergencies. Please call our office to let us know. If this occurs at night or on weekends, it can wait until the next business day. Send a remote transmission.  . If your device is capable of reading fluid status (for heart failure), you will be offered monthly monitoring to review this with  you.   DEVICE MANAGEMENT . Remote monitoring is used to monitor your pacemaker from home. This monitoring is scheduled every 91 days by our office. It allows [place] to keep an eye on the functioning of your device to ensure it is working properly. You will routinely see your Electrophysiologist annually (more often if necessary).   . You should receive your ID card for your new device in 4-8 weeks. Keep this card with you at all times once received. Consider wearing a medical alert bracelet or necklace.  . Your Pacemaker may be MRI compatible. This will be discussed at your next office visit/wound check.  You should avoid contact with strong electric or magnetic fields.    Do not use amateur (ham) radio equipment or electric (arc) welding torches. MP3 player headphones with magnets should not be used. Some devices are safe to use if held at least 12 inches (30 cm) from your Pacemaker. These include power tools, lawn mowers, and speakers. If you are unsure if something is safe to use, ask your health care provider.   When using your cell phone, hold it to the ear that is on the opposite side from the Pacemaker. Do not leave your cell phone in a pocket over the Pacemaker.   You may safely use electric blankets, heating pads, computers, and microwave ovens.  Call the office right away if:  You have chest pain.  You feel more short of breath than you have felt before.  You feel more light-headed than you have felt before.  Your incision starts to open up.  This information is not intended to replace advice given to you by your health care provider. Make sure you discuss any questions you have with your health care provider.

## 2019-10-29 ENCOUNTER — Telehealth: Payer: Self-pay

## 2019-10-29 MED FILL — Lidocaine HCl Local Inj 1%: INTRAMUSCULAR | Qty: 60 | Status: AC

## 2019-10-29 NOTE — Telephone Encounter (Signed)
Spoke with pt following same day D/C following CRT-D implant.  Pt has not sent manual transmission from device.  Assisted pt with sending manual transmission, verified receipt and report looks appropriate.  Pt indicates she is feeling well today, a little sore but otherwise manageable.  Pt was resting at time of call.  Pt had to end call abruptly to answer another call.

## 2019-10-29 NOTE — Telephone Encounter (Signed)
-----   Message from [place], [place] sent at 10/28/2019  3:12 PM EDT ----- Same day d/c  CRT-D  w/GT  Thanks!

## 2019-11-01 ENCOUNTER — Telehealth: Payer: Self-pay | Admitting: Licensed Clinical Social Worker

## 2019-11-01 NOTE — Telephone Encounter (Signed)
CSW received call from [place] regarding pt.  Pt currently with transportation concerns for several upcoming appts and [facility] rep was inquiring if we could assist with transport to upcoming [facility] on 3/25 if pt son is unable to transport.  [facility] was contacted through [employer] rep who is trying to help pt coordinate.  CSW provided permission to get transport for pt to [facility] appt through [employer] if pt son unable to bring.  Transport also needed to get to ortho appt in [place] next month- CSW provided number to [facility] transport which can provide local transports for individuals for a few dollars- [facility] to pass along to [employer] rep alongside phone number for CSW in case they need further assistance with transport for this pt.  Jorge [place], [facility] Desk#: [phone] Cell#: [phone]

## 2019-11-07 ENCOUNTER — Other Ambulatory Visit: Payer: Self-pay

## 2019-11-07 ENCOUNTER — Ambulatory Visit (INDEPENDENT_AMBULATORY_CARE_PROVIDER_SITE_OTHER): Payer: PPO | Admitting: *Deleted

## 2019-11-07 DIAGNOSIS — I5042 Chronic combined systolic (congestive) and diastolic (congestive) heart failure: Secondary | ICD-10-CM | POA: Diagnosis not present

## 2019-11-07 DIAGNOSIS — I428 Other cardiomyopathies: Secondary | ICD-10-CM

## 2019-11-07 LAB — CUP PACEART INCLINIC DEVICE CHECK
Date Time Interrogation Session: 20210325180642
Implantable Lead Implant Date: 20210315
Implantable Lead Implant Date: 20210315
Implantable Lead Implant Date: 20210315
Implantable Lead Location: 753858
Implantable Lead Location: 753859
Implantable Lead Location: 753860
Implantable Lead Model: 4598
Implantable Lead Model: 5076
Implantable Lead Model: 6935
Implantable Pulse Generator Implant Date: 20210315
Lead Channel Setting Pacing Amplitude: 3.5 V
Lead Channel Setting Pacing Amplitude: 3.5 V
Lead Channel Setting Pacing Amplitude: 4 V
Lead Channel Setting Pacing Pulse Width: 0.4 ms
Lead Channel Setting Pacing Pulse Width: 0.4 ms
Lead Channel Setting Sensing Sensitivity: 0.3 mV

## 2019-11-07 NOTE — Patient Instructions (Signed)
Call the office if you have any drainage , redness, or increased swelling at wound site. Call the office if you have a fever or chills.

## 2019-11-07 NOTE — Progress Notes (Signed)
Wound check appointment. Steri-strips removed. Wound without redness or edema. Incision edges approximated, wound well healed. Normal device function. Thresholds, sensing, and impedances consistent with implant measurements. Device programmed at 3.5V for extra safety margin until 3 month visit. Histogram distribution appropriate for patient and level of activity. No mode switches or ventricular arrhythmias noted. Patient educated about wound care, arm mobility, lifting restrictions, shock plan. ROV with Dr Lovena Le 02/12/20. Enrolled in remote transmission program and next remote scheduled for 01/27/20.

## 2019-11-14 ENCOUNTER — Other Ambulatory Visit: Payer: Self-pay | Admitting: Family Medicine

## 2019-11-20 ENCOUNTER — Other Ambulatory Visit: Payer: Self-pay

## 2019-11-20 ENCOUNTER — Ambulatory Visit: Payer: PPO | Admitting: Orthopedic Surgery

## 2019-11-20 ENCOUNTER — Encounter: Payer: Self-pay | Admitting: Orthopedic Surgery

## 2019-11-20 VITALS — BP 111/64 | HR 75 | Ht 66.0 in | Wt 168.0 lb

## 2019-11-20 DIAGNOSIS — M1711 Unilateral primary osteoarthritis, right knee: Secondary | ICD-10-CM

## 2019-11-20 DIAGNOSIS — M25461 Effusion, right knee: Secondary | ICD-10-CM | POA: Diagnosis not present

## 2019-11-20 DIAGNOSIS — M5410 Radiculopathy, site unspecified: Secondary | ICD-10-CM | POA: Diagnosis not present

## 2019-11-20 DIAGNOSIS — M1710 Unilateral primary osteoarthritis, unspecified knee: Secondary | ICD-10-CM

## 2019-11-20 NOTE — Progress Notes (Signed)
Chief Complaint  Patient presents with  . Knee Pain    right knee soreness but not painful     Encounter Diagnoses  Name Primary?  . Radicular leg pain Yes  . Effusion, right knee   . Primary localized osteoarthritis of knee     Chief Complaint  Patient presents with  . Knee Pain      ER follow up on right knee.     Follow-up visit for this 74 year old female for right knee pain we gave her an injection put her on gabapentin and ibuprofen for presumed effusion right knee with osteoarthritis and radicular leg pain     She says she is doing much better just has some soreness in her knees  She was able to tolerate her cardiac implant without any difficulty  She is able to ambulate with a cane again  Her knee is no longer warm to touch she has good range of motion no swelling  Recommend follow-up as needed  She will not be able to take the ibuprofen but she is able to take the gabapentin which we will continue   Initial history: 74 year old female presents after going to the emergency room on March 8 with several day history of right knee pain and swelling which progressed to include right hip lower back right leg pain radiating into her right foot.  After x-rays of the knee showed arthritis in the back showed arthritis she was sent here for follow-up.  She did take some hydrocodone for pain but she still has significant pain loss of motion in the knee swelling warmth and a significant limp   Review of systems she has a severe heart disease some muscle aches of back pain joint pain no fever or chills    Treatment Encounter Diagnoses  Name Primary?  . Radicular leg pain Yes  . Effusion, right knee    . Primary localized osteoarthritis of knee          Meds ordered this encounter  Medications  . gabapentin (NEURONTIN) 100 MG capsule      Sig: Take 1 capsule (100 mg total) by mouth 3 (three) times daily.      Dispense:  90 capsule      Refill:  2  . ibuprofen (ADVIL)  800 MG tablet      Sig: Take 1 tablet (800 mg total) by mouth every 8 (eight) hours as needed.      Dispense:  90 tablet      Refill:  1    We did arrange a follow-up for her

## 2019-12-02 ENCOUNTER — Other Ambulatory Visit: Payer: Self-pay | Admitting: Family Medicine

## 2019-12-05 ENCOUNTER — Encounter (HOSPITAL_COMMUNITY): Payer: Self-pay

## 2019-12-05 ENCOUNTER — Emergency Department (HOSPITAL_COMMUNITY): Payer: PPO

## 2019-12-05 ENCOUNTER — Emergency Department (HOSPITAL_COMMUNITY)
Admission: EM | Admit: 2019-12-05 | Discharge: 2019-12-05 | Disposition: A | Discharge status: Home / Self Care | Payer: PPO | Attending: Emergency Medicine | Admitting: Emergency Medicine

## 2019-12-05 ENCOUNTER — Other Ambulatory Visit: Payer: Self-pay

## 2019-12-05 DIAGNOSIS — M25461 Effusion, right knee: Secondary | ICD-10-CM | POA: Diagnosis not present

## 2019-12-05 DIAGNOSIS — Z20822 Contact with and (suspected) exposure to covid-19: Secondary | ICD-10-CM | POA: Diagnosis not present

## 2019-12-05 DIAGNOSIS — R509 Fever, unspecified: Secondary | ICD-10-CM | POA: Diagnosis not present

## 2019-12-05 DIAGNOSIS — N390 Urinary tract infection, site not specified: Secondary | ICD-10-CM | POA: Insufficient documentation

## 2019-12-05 DIAGNOSIS — I130 Hypertensive heart and chronic kidney disease with heart failure and stage 1 through stage 4 chronic kidney disease, or unspecified chronic kidney disease: Secondary | ICD-10-CM | POA: Insufficient documentation

## 2019-12-05 DIAGNOSIS — I5042 Chronic combined systolic (congestive) and diastolic (congestive) heart failure: Secondary | ICD-10-CM | POA: Diagnosis not present

## 2019-12-05 DIAGNOSIS — Z7982 Long term (current) use of aspirin: Secondary | ICD-10-CM | POA: Diagnosis not present

## 2019-12-05 DIAGNOSIS — E1122 Type 2 diabetes mellitus with diabetic chronic kidney disease: Secondary | ICD-10-CM | POA: Insufficient documentation

## 2019-12-05 DIAGNOSIS — Z79899 Other long term (current) drug therapy: Secondary | ICD-10-CM | POA: Insufficient documentation

## 2019-12-05 DIAGNOSIS — Z7984 Long term (current) use of oral hypoglycemic drugs: Secondary | ICD-10-CM | POA: Diagnosis not present

## 2019-12-05 DIAGNOSIS — R000 Tachycardia, unspecified: Secondary | ICD-10-CM | POA: Diagnosis not present

## 2019-12-05 DIAGNOSIS — N184 Chronic kidney disease, stage 4 (severe): Secondary | ICD-10-CM | POA: Diagnosis not present

## 2019-12-05 DIAGNOSIS — M25561 Pain in right knee: Secondary | ICD-10-CM | POA: Diagnosis present

## 2019-12-05 DIAGNOSIS — Z03818 Encounter for observation for suspected exposure to other biological agents ruled out: Secondary | ICD-10-CM | POA: Diagnosis not present

## 2019-12-05 LAB — SYNOVIAL CELL COUNT + DIFF, W/ CRYSTALS
Eosinophils-Synovial: 0 % (ref 0–1)
Lymphocytes-Synovial Fld: 6 % (ref 0–20)
Monocyte-Macrophage-Synovial Fluid: 0 % — ABNORMAL LOW (ref 50–90)
Neutrophil, Synovial: 94 % — ABNORMAL HIGH (ref 0–25)
WBC, Synovial: 10150 /mm3 — ABNORMAL HIGH (ref 0–200)

## 2019-12-05 LAB — CBC WITH DIFFERENTIAL/PLATELET
Abs Immature Granulocytes: 0.03 10*3/uL (ref 0.00–0.07)
Basophils Absolute: 0.1 10*3/uL (ref 0.0–0.1)
Basophils Relative: 1 %
Eosinophils Absolute: 0 10*3/uL (ref 0.0–0.5)
Eosinophils Relative: 0 %
HCT: 36.4 % (ref 36.0–46.0)
Hemoglobin: 11.5 g/dL — ABNORMAL LOW (ref 12.0–15.0)
Immature Granulocytes: 0 %
Lymphocytes Relative: 12 %
Lymphs Abs: 1.3 10*3/uL (ref 0.7–4.0)
MCH: 28 pg (ref 26.0–34.0)
MCHC: 31.6 g/dL (ref 30.0–36.0)
MCV: 88.6 fL (ref 80.0–100.0)
Monocytes Absolute: 1.1 10*3/uL — ABNORMAL HIGH (ref 0.1–1.0)
Monocytes Relative: 11 %
Neutro Abs: 7.7 10*3/uL (ref 1.7–7.7)
Neutrophils Relative %: 76 %
Platelets: 145 10*3/uL — ABNORMAL LOW (ref 150–400)
RBC: 4.11 MIL/uL (ref 3.87–5.11)
RDW: 15.6 % — ABNORMAL HIGH (ref 11.5–15.5)
WBC: 10.3 10*3/uL (ref 4.0–10.5)
nRBC: 0 % (ref 0.0–0.2)

## 2019-12-05 LAB — URINALYSIS, ROUTINE W REFLEX MICROSCOPIC
Bilirubin Urine: NEGATIVE
Glucose, UA: NEGATIVE mg/dL
Hgb urine dipstick: NEGATIVE
Ketones, ur: NEGATIVE mg/dL
Nitrite: NEGATIVE
Protein, ur: NEGATIVE mg/dL
Specific Gravity, Urine: 1.017 (ref 1.005–1.030)
pH: 5 (ref 5.0–8.0)

## 2019-12-05 LAB — BASIC METABOLIC PANEL
Anion gap: 14 (ref 5–15)
BUN: 42 mg/dL — ABNORMAL HIGH (ref 8–23)
CO2: 21 mmol/L — ABNORMAL LOW (ref 22–32)
Calcium: 9.5 mg/dL (ref 8.9–10.3)
Chloride: 99 mmol/L (ref 98–111)
Creatinine, Ser: 1.79 mg/dL — ABNORMAL HIGH (ref 0.44–1.00)
GFR calc Af Amer: 32 mL/min — ABNORMAL LOW (ref >60)
GFR calc non Af Amer: 27 mL/min — ABNORMAL LOW (ref >60)
Glucose, Bld: 188 mg/dL — ABNORMAL HIGH (ref 70–99)
Potassium: 3.9 mmol/L (ref 3.5–5.1)
Sodium: 134 mmol/L — ABNORMAL LOW (ref 135–145)

## 2019-12-05 LAB — MAGNESIUM: Magnesium: 2.1 mg/dL (ref 1.7–2.4)

## 2019-12-05 LAB — LACTIC ACID, PLASMA: Lactic Acid, Venous: 1 mmol/L (ref 0.5–1.9)

## 2019-12-05 LAB — GRAM STAIN: Gram Stain: NONE SEEN

## 2019-12-05 LAB — RESPIRATORY PANEL BY RT PCR (FLU A&B, COVID)
Influenza A by PCR: NEGATIVE
Influenza B by PCR: NEGATIVE
SARS Coronavirus 2 by RT PCR: NEGATIVE

## 2019-12-05 MED ORDER — POVIDONE-IODINE 10 % EX SOLN
CUTANEOUS | Status: DC | PRN
  Filled 2019-12-05: qty 15

## 2019-12-05 MED ORDER — ACETAMINOPHEN 325 MG PO TABS
650.0000 mg | ORAL_TABLET | Freq: Once | ORAL | Status: AC | PRN
  Administered 2019-12-05: 650 mg via ORAL
  Filled 2019-12-05: qty 2

## 2019-12-05 MED ORDER — COLCHICINE 0.6 MG PO TABS: 0.6000 mg | ORAL_TABLET | Freq: Two times a day (BID) | ORAL | 0 refills | Status: DC

## 2019-12-05 MED ORDER — CEPHALEXIN 500 MG PO CAPS: 500.0000 mg | ORAL_CAPSULE | Freq: Two times a day (BID) | ORAL | 0 refills | Status: DC

## 2019-12-05 MED ORDER — LIDOCAINE HCL (PF) 1 % IJ SOLN: 5.0000 mL | Freq: Once | INTRAMUSCULAR | Status: DC

## 2019-12-05 MED ORDER — LIDOCAINE HCL (PF) 2 % IJ SOLN
INTRAMUSCULAR | Status: AC
  Filled 2019-12-05: qty 10

## 2019-12-05 MED ORDER — MORPHINE SULFATE (PF) 4 MG/ML IV SOLN
3.0000 mg | Freq: Once | INTRAVENOUS | Status: AC
  Administered 2019-12-05: 3 mg via INTRAVENOUS
  Filled 2019-12-05: qty 1

## 2019-12-05 MED ORDER — LIDOCAINE HCL (PF) 2 % IJ SOLN
5.0000 mL | Freq: Once | INTRAMUSCULAR | Status: AC
  Administered 2019-12-05: 5 mL via INTRADERMAL

## 2019-12-05 MED ORDER — CEPHALEXIN 500 MG PO CAPS
500.0000 mg | ORAL_CAPSULE | Freq: Once | ORAL | Status: AC
  Administered 2019-12-05: 500 mg via ORAL
  Filled 2019-12-05: qty 1

## 2019-12-05 NOTE — ED Triage Notes (Signed)
Pt reports right knee pain for 2 days. Pt reports no injury.

## 2019-12-05 NOTE — Discharge Instructions (Addendum)
The evaluation of your knee appears to be consistent with gout.  Take the colchicine as directed and take with food.  You may wear the knee brace as needed for support.  Call Dr. Ruthe Mannan office to arrange a follow-up appointment.  Your urinalysis also shows that you have a likely urinary tract infection.  Is important that you take the antibiotic as directed until it is finished.  Follow-up with your primary doctor for recheck of your urine.  Return to the emergency department if you develop any worsening symptoms.  If you develop a rash, difficulty swallowing or breathing stop the medications immediately and return to the emergency department.

## 2019-12-05 NOTE — ED Provider Notes (Signed)
Medical screening examination/treatment/procedure(s) were conducted as a shared visit with non-physician practitioner(s) and myself.  I personally evaluated the patient during the encounter.  Patient presents to the ED with knee pain and fever. No joint redness with mild warmth. Joint aspiration performed. See separate procedure note. Labs and imaging. Discussed knee fluid cell count and gram stain results with Dr. Aline Brochure. Does not think this represents septic joint. Plan for ortho f/u and treatment for UTI which is likely fever source. Urine sent for Cx.   .Joint Aspiration/Arthrocentesis  Date/Time: 12/05/2019 10:46 AM Performed by: Margette Fast, MD Authorized by: Margette Fast, MD   Consent:    Consent obtained:  Verbal   Consent given by:  Patient   Risks discussed:  Bleeding, incomplete drainage, nerve damage, infection, pain and poor cosmetic result   Alternatives discussed:  No treatment Location:    Location:  Knee   Knee:  R knee Anesthesia (see MAR for exact dosages):    Anesthesia method:  Local infiltration   Local anesthetic:  Lidocaine 2% w/o epi Procedure details:    Preparation: Patient was prepped and draped in usual sterile fashion     Needle gauge:  18 G   Ultrasound guidance: no     Approach:  Lateral   Aspirate amount:  40 mL    Aspirate characteristics:  Serous and yellow   Steroid injected: no     Specimen collected: yes   Post-procedure details:    Dressing:  Adhesive bandage   Patient tolerance of procedure:  Tolerated well, no immediate complications     Nanda Quinton, MD Emergency Medicine    Mahima Hottle, [facility], MD 12/06/19 [phone]

## 2019-12-05 NOTE — ED Provider Notes (Signed)
[facility] Provider Note   CSN: [id number] Arrival date & time: 12/05/19  0840     History Chief Complaint  Patient presents with  . Knee Pain    [name] [name] is a 74 y.o. female.  HPI      [name] [name] is a 74 y.o. female with past medical history of CHF, CKD, diabetes, aortic valve replacement in 2016 and cardiac defibrillator placed in March 2021 who presents to the Emergency Department complaining of recurrent pain and swelling of the right knee.  She reports increasing pain for 2 to 3 days.  She notes some swelling of her knee.  She was seen by her orthopedic provider, Dr. Aline Brochure on 10/23/2019 for knee pain and had an aspiration procedure and steroid injection of the knee.  She states it provided temporary relief.  She states both knees are "bone-on-bone" but states she has been told that she is not a surgical candidate due to her health conditions.  She also reports decreased mobility due to the constant pain of both knees.  She does use a cane or walker when needed.  She was noted to have a fever here of 101 on arrival.  She had a negative Covid test in January prior to her defibrillator placement, but states that she has not had a Covid vaccine.  She denies complications from her defibrillator placement, known fever or chills, cough, chest pain, or shortness of breath.  No nausea or vomiting.  No dysuria symptoms.   Past Medical History:  Diagnosis Date  . Anxiety   . Aortic stenosis   . Arthritis   . Chronic combined systolic and diastolic CHF (congestive heart failure) ([place])   . CKD (chronic kidney disease) stage 3, GFR 30-59 ml/min   . Constipation   . Depression   . Diabetes mellitus, type 2 ([place])   . Essential hypertension, benign   . Hyperlipidemia   . Incidental pulmonary nodule, > 5m and < 8340m   7 x 4 mm LLL nodule  . LBBB (left bundle branch block)   . Nonischemic cardiomyopathy (HCC)    No significant obstructive CAD at heart  catheterization 05/2014, LVEF 20%  . Obesity   . Pneumonia 2013  . 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 Symptomatic PVCs     Patient Active Problem List   Diagnosis Date Noted  . Generalized osteoarthritis 07/26/2019  . Screening for depression 02/10/2018  . Osteoarthritis of left knee 03/16/2017  . Annual physical exam 08/18/2015  . 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 Severe aortic valve stenosis 03/24/2015  . Hyperlipidemia LDL goal <100 05/24/2014  . Severe aortic stenosis 05/23/2014  . Incidental pulmonary nodule, > 40m46mnd < 8mm51m/29/2015  . Chronic combined systolic and diastolic CHF (congestive heart failure) (HCC)[place]. LBBB (left bundle branch block) 12/03/2013  . 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 Seasonal allergies 02/22/2012  . Essential hypertension, benign   . Nonischemic cardiomyopathy (HCC)[place]/08/2011  . 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 Overweight (BMI 25.0-29.9) 10/24/2007    Past Surgical History:  Procedure Laterality Date  . BIV ICD INSERTION CRT-D N/A 10/28/2019   Procedure: BIV ICD INSERTION CRT-D;  Surgeon: TaylEvans Lance;  [facility];  Service: Cardiovascular;  Laterality:  N/A;  . CARDIAC CATHETERIZATION    . CESAREAN SECTION  1987  . FLEXIBLE BRONCHOSCOPY N/A 01/14/2015   Procedure: FLEXIBLE BRONCHOSCOPY;  Surgeon: [name], MD;  Location: [facility];  Service: Thoracic;  Laterality: N/A;  . LEFT AND RIGHT HEART CATHETERIZATION WITH CORONARY ANGIOGRAM N/A 12/05/2011   Procedure: LEFT AND RIGHT HEART CATHETERIZATION WITH CORONARY ANGIOGRAM;  Surgeon: [name], MD;  Location: [facility] CATH LAB;  Service: Cardiovascular;  Laterality: N/A;  . LEFT AND RIGHT HEART CATHETERIZATION WITH CORONARY ANGIOGRAM N/A 05/23/2014    Procedure: LEFT AND RIGHT HEART CATHETERIZATION WITH CORONARY ANGIOGRAM;  Surgeon: [name], MD;  Location: [facility] C[facility] LAB;  Service: Cardiovascular;  Laterality: N/A;  . MULTIPLE EXTRACTIONS WITH ALVEOLOPLASTY N/A 02/27/2014   Procedure: Extraction of tooth #'s 2,4,5,6,7,8,9,10,11,12, 22, 23, 24, 25, 26, 28 with alveoloplasty and bilateral mandibular tori reductions;  Surgeon: [name], DDS;  Location: [facility];  Service: Oral Surgery;  Laterality: N/A;  . RIGID BRONCHOSCOPY N/A 01/14/2015   Procedure: RIGID BRONCHOSCOPY with Removal Of Foreign Body ;  Surgeon: [name], MD;  Location: [facility];  Service: Thoracic;  Laterality: N/A;  . TEE WITHOUT CARDIOVERSION N/A 03/24/2015   Procedure: TRANSESOPHAGEAL ECHOCARDIOGRAM (TEE);  Surgeon: [name], MD;  Location: [facility];  Service: Open Heart Surgery;  Laterality: N/A;  . TRANSCATHETER AORTIC VALVE REPLACEMENT, TRANSFEMORAL Bilateral 03/24/2015   Procedure: TRANSCATHETER AORTIC VALVE REPLACEMENT, TRANSFEMORAL;  Surgeon: [name], MD;  Location: [facility];  Service: Open Heart Surgery;  Laterality: Bilateral;  . TUBAL LIGATION  1987  . VIDEO BRONCHOSCOPY Bilateral 06/05/2014   Procedure: VIDEO BRONCHOSCOPY WITHOUT FLUORO;  Surgeon: [name], MD;  Location: [facility] ENDOSCOPY;  Service: Cardiopulmonary;  Laterality: Bilateral;     OB History   No obstetric history on file.     Family History  Problem Relation Age of Onset  . Heart disease Mother   . Heart attack Mother 32  . Diabetes Mother   . Hypertension Mother   . Colon cancer Father 56  . Breast cancer Sister   . Diabetes Brother   . Hypertension Brother   . Stroke Brother   . Cancer Brother   . Congestive Heart Failure Daughter   . Hypertension Son   . High Cholesterol Son   . Congestive Heart Failure Son     Social History   Tobacco Use  . Smoking status: Never Smoker  . Smokeless tobacco: Never Used  Substance Use Topics  .  Alcohol use: No  . Drug use: No    Home Medications Prior to Admission medications   Medication Sig Start Date End Date Taking? Authorizing Provider  acetaminophen (TYLENOL) 500 MG tablet Take one tablet two times daily, by mouth, for knee  pain Patient taking differently: Take 500 mg by mouth 2 (two) times daily as needed for moderate pain. Take one tablet two times daily, by mouth, for knee  pain 07/18/19   Fayrene Helper, MD  aspirin EC 81 MG tablet Take 81 mg by mouth every morning.     [provider]  blood glucose meter kit and supplies Dispense based on patient and insurance preference. Use to test three times daily (FOR ICD-10 E10.9, E11.9). 05/07/19   Fayrene Helper, MD  carvedilol (COREG) 25 MG tablet TAKE 1 TABLET BY MOUTH TWICE DAILY WITH MEALS. Patient taking differently: Take 25 mg by mouth 2 (two) times daily with a meal.  05/03/19  Arnoldo Lenis, MD  diclofenac Sodium (VOLTAREN) 1 % GEL Apply 1 application topically 4 (four) times daily as needed (pain).    [provider]  ezetimibe (ZETIA) 10 MG tablet TAKE ONE TABLET BY MOUTH ONCE DAILY. Patient taking differently: Take 10 mg by mouth daily.  10/14/19   Fayrene Helper, MD  fenofibrate (TRICOR) 145 MG tablet TAKE (1) TABLET BY MOUTH ONCE DAILY. Patient taking differently: Take 145 mg by mouth daily.  10/14/19   Fayrene Helper, MD  furosemide (LASIX) 40 MG tablet TAKE (1) TABLET BY MOUTH EACH MORNING. 12/02/19   Fayrene Helper, MD  gabapentin (NEURONTIN) 100 MG capsule Take 1 capsule (100 mg total) by mouth 3 (three) times daily. 10/23/19   Carole Civil, MD  glipiZIDE (GLIPIZIDE XL) 5 MG 24 hr tablet Take 1 tablet (5 mg total) by mouth daily with breakfast. 07/18/19   Fayrene Helper, MD  HYDROcodone-acetaminophen (NORCO/VICODIN) 5-325 MG tablet Take 1 tablet by mouth every 4 (four) hours as needed. Patient taking differently: Take 1 tablet by mouth every 4 (four) hours as  needed for moderate pain.  10/21/19   Tacy Learn, PA-C  insulin aspart protamine - aspart (NOVOLOG MIX 70/30 FLEXPEN) (70-30) 100 UNIT/ML FlexPen INJECT 12 TO 15 UNITS SUBCUTANEOUSLY TWICE DAILY. Patient taking differently: Inject 12-15 Units into the skin See admin instructions. Inject 15 units in the morning and 12 units in the evening 09/09/19   Fayrene Helper, MD  Insulin Pen Needle (LITETOUCH PEN NEEDLES) 31G X 8 MM MISC USE AS NEEDED TO INJECT INSULIN. 07/18/19   Fayrene Helper, MD  Lancets [facility] DELICA PLUS [id number]) MISC USE 3 TIMES A DAY AS DIRECTED. 10/01/19   Fayrene Helper, MD  LIVALO 2 MG TABS TAKE ONE TABLET BY MOUTH ONCE DAILY AFTER SUPPER. Patient taking differently: Take 2 mg by mouth at bedtime.  08/14/19   Fayrene Helper, MD  Multiple Vitamins-Minerals (MULTIVITAMIN PO) Take 1 tablet by mouth every morning.     [provider]  niacin (NIASPAN) 500 MG CR tablet TAKE (1) TABLET BY MOUTH AT BEDTIME. Patient taking differently: Take 500 mg by mouth at bedtime.  10/14/19   Fayrene Helper, MD  ONETOUCH ULTRA test strip CHECK BLOOD SUGAR 3 TIMES A DAY. 11/14/19   Fayrene Helper, MD  potassium chloride (KLOR-CON) 10 MEQ tablet TAKE ONE TABLET BY MOUTH DAILY. DO NOT TAKE IF NOT TAKING FUROSEMIDE. Patient taking differently: Take 10 mEq by mouth daily.  10/14/19   Fayrene Helper, MD  spironolactone (ALDACTONE) 25 MG tablet Take 1 tablet (25 mg total) by mouth daily. 07/18/19   Fayrene Helper, MD  UNABLE TO FIND Shower bench x 1   DX G66.59 05/02/19   Fayrene Helper, MD  UNABLE TO FIND Rolling walker with seat x 1  DX m17.12 05/02/19   Fayrene Helper, MD  cetirizine (ZYRTEC) 10 MG tablet Take 1 tablet (10 mg total) by mouth daily. 06/30/11 04/19/18  Fayrene Helper, MD  ferrous sulfate 325 (65 FE) MG EC tablet Take 1 tablet (325 mg total) by mouth 2 (two) times daily. 07/14/11 10/28/11  Fayrene Helper, MD  FLUoxetine (PROZAC)  10 MG tablet Take 1 tablet (10 mg total) by mouth daily. Take one capsule by mouth once a day 12/20/10 11/03/11  Fayrene Helper, MD    Allergies    Crestor [rosuvastatin], Tramadol, and Penicillins  Review of Systems  Review of Systems  Constitutional: Negative for chills and fever.  Respiratory: Negative for cough and shortness of breath.   Cardiovascular: Negative for chest pain.  Gastrointestinal: Negative for abdominal pain, nausea and vomiting.  Genitourinary: Negative for difficulty urinating and dysuria.  Musculoskeletal: Positive for arthralgias (right knee pain and swelling) and joint swelling.  Skin: Negative for color change and wound.  Neurological: Negative for dizziness, weakness and headaches.    Physical Exam Updated Vital Signs BP (!) 137/91 (BP Location: Left Arm)   Pulse 87   Temp (!) 101 F (38.3 C) (Oral)   Ht 5' 6"  (1.676 m)   Wt 76 kg   SpO2 99%   BMI 27.04 kg/m   Physical Exam Vitals and nursing note reviewed.  Constitutional:      General: She is not in acute distress.    Appearance: She is not ill-appearing or toxic-appearing.     Comments: Pt is uncomfortable appearing.    HENT:     Mouth/Throat:     Mouth: Mucous membranes are moist.  Cardiovascular:     Rate and Rhythm: Normal rate and regular rhythm.     Pulses: Normal pulses.  Pulmonary:     Effort: Pulmonary effort is normal.     Breath sounds: Normal breath sounds.     Comments: Pt has a cardiac defibrillator to left upper chest.  Incision appears well-healed.  No tenderness, edema or surrounding erythema. Chest:     Chest wall: No tenderness.  Abdominal:     General: There is no distension.     Palpations: Abdomen is soft.     Tenderness: There is no abdominal tenderness. There is no guarding.  Musculoskeletal:        General: Tenderness present.     Right knee: Swelling and effusion present. No erythema. Decreased range of motion. Tenderness present.     Comments: Diffuse  tenderness palpation of the anterior right knee.  Mild to moderate palpable effusion present.  Range of motion of the knee is limited due to patient's level of pain.  No erythema of the joint  Skin:    General: Skin is warm.     Capillary Refill: Capillary refill takes less than 2 seconds.  Neurological:     General: No focal deficit present.     Mental Status: She is alert.     GCS: GCS eye subscore is 4. GCS verbal subscore is 5. GCS motor subscore is 6.     Sensory: Sensation is intact. No sensory deficit.     Motor: No weakness.     Coordination: Coordination is intact.     Comments: CN II-XII grossly intact.  Speech clear.  mentating well     ED Results / Procedures / Treatments   Labs (all labs ordered are listed, but only abnormal results are displayed) Labs Reviewed  CBC WITH DIFFERENTIAL/PLATELET - Abnormal; Notable for the following components:      Result Value   Hemoglobin 11.5 (*)    RDW 15.6 (*)    Platelets 145 (*)    Monocytes Absolute 1.1 (*)    All other components within normal limits  BASIC METABOLIC PANEL - Abnormal; Notable for the following components:   Sodium 134 (*)    CO2 21 (*)    Glucose, Bld 188 (*)    BUN 42 (*)    Creatinine, Ser 1.79 (*)    GFR calc non Af Amer 27 (*)    GFR calc Af Wyvonnia Lora  32 (*)    All other components within normal limits  SYNOVIAL CELL COUNT + DIFF, W/ CRYSTALS - Abnormal; Notable for the following components:   Appearance-Synovial HAZY (*)    WBC, Synovial 10,150 (*)    Neutrophil, Synovial 94 (*)    Monocyte-Macrophage-Synovial Fluid 0 (*)    All other components within normal limits  URINALYSIS, ROUTINE W REFLEX MICROSCOPIC - Abnormal; Notable for the following components:   Leukocytes,Ua TRACE (*)    Bacteria, UA MANY (*)    All other components within normal limits  RESPIRATORY PANEL BY RT PCR (FLU A&B, COVID)  CULTURE, BLOOD (ROUTINE X 2)  CULTURE, BLOOD (ROUTINE X 2)  GRAM STAIN  CULTURE, BODY FLUID-BOTTLE    URINE CULTURE  LACTIC ACID, PLASMA  MAGNESIUM  GLUCOSE, BODY FLUID OTHER    EKG EKG Interpretation  Date/Time:  Thursday December 05 2019 09:43:53 EDT Ventricular Rate:  82 PR Interval:    QRS Duration: 150 QT Interval:  410 QTC Calculation: 479 R Axis:   -3 Text Interpretation: Sinus arrhythmia Ventricular tachycardia, unsustained Probable left atrial enlargement Left bundle branch block Baseline wander in lead(s) III No STEMI Confirmed by Nanda Quinton [phone]) on 12/05/2019 9:47:02 AM   Radiology DG Chest Portable 1 View  Result Date: 12/05/2019 CLINICAL DATA:  Fever EXAM: PORTABLE CHEST 1 VIEW COMPARISON:  10/28/2019 FINDINGS: Cardiomegaly with left chest multi lead pacer defibrillator and aortic valve stent endograft. Both lungs are clear. The visualized skeletal structures are unremarkable. IMPRESSION: Cardiomegaly without acute abnormality of the lungs in AP portable projection. Electronically Signed   By: Eddie Candle M.D.   On: 12/05/2019 10:03    Procedures Procedures (including critical care time)  Medications Ordered in ED Medications  acetaminophen (TYLENOL) tablet 650 mg (650 mg Oral Given 12/05/19 0904)  lidocaine HCl (PF) (XYLOCAINE) 2 % injection 5 mL (5 mLs Intradermal Given 12/05/19 1046)  morphine 4 MG/ML injection 3 mg (3 mg Intravenous Given 12/05/19 1027)  cephALEXin (KEFLEX) capsule 500 mg (500 mg Oral Given 12/05/19 1508)    ED Course  I have reviewed the triage vital signs and the nursing notes.  Pertinent labs & imaging results that were available during my care of the patient were reviewed by me and considered in my medical decision making (see chart for details).    MDM Rules/Calculators/A&P                      Patient here with recurrent right knee pain.  She was noted to have a fever of 101 on arrival.  Patient unaware of fever.  She denies recent illness, known Covid exposures or symptoms other than right knee pain.  The right knee was  aspirated and steroid injection given on 10/23/2019 by her orthopedic provider.  Patient also had a recent cardiac defibrillator placed.  But chest site appears well-healed and without obvious clinical signs of infection.  There is significant tenderness and mild effusion of the right knee, there is no erythema of the joint.  This may represent a septic joint.  Will evaluate for other sources as well.  No focal neuro deficits.  will obtain labs, EKG, blood cultures and plan to aspirate the joint.  We will also obtain PCR Covid test.  Knee aspirated by Dr. Laverta Baltimore, see his note for procedure.    On recheck, fever improved after tylenol and pt resting comfortably.  Reports knee pain improved. Ambulated to restroom and tolerating oral fluids  No  clear source of fever found on work up.  Aspirate shows no organism's seen on gram stain and cell ct reveals 10,150 WBC's, urate crystals, and 94 neutrophils.  This is felt to likely represent inflammatory process, but will consult with orthopedics given the elevated neutrophil count.    covid test neg, lactic acid wnml, CXR reassuring.  [place]. Aline Brochure and discussed findings.  It was felt that results are likely inflammatory and less likely to represent a septic joint.  Recommends colchicine, knee brace and he will see pt in office for f/u.  U/A reveals trace leukocytosis, many bacteria and WBC's.  This may be source of fever. Urine cultured and I will start antibiotic.  Pt appears well and no concern for sepsis.    Final Clinical Impression(s) / ED Diagnoses Final diagnoses:  Effusion of right knee  Urinary tract infection in elderly patient    Rx / DC Orders ED Discharge Orders    None       Bufford Lope 12/05/19 2051    Margette Fast, MD 12/06/19 [phone]

## 2019-12-06 LAB — GLUCOSE, BODY FLUID OTHER: Glucose, Body Fluid Other: 134 mg/dL

## 2019-12-08 LAB — URINE CULTURE: Culture: >=100000 — AB

## 2019-12-09 ENCOUNTER — Telehealth: Payer: Self-pay | Admitting: *Deleted

## 2019-12-09 NOTE — Telephone Encounter (Signed)
Post ED Visit - Positive Culture Follow-up  Culture report reviewed by antimicrobial stewardship pharmacist: [facility] Team []  Elenor Quinones, Pharm.D. []  Heide Guile, Pharm.D., BCPS AQ-ID []  Parks Neptune, Pharm.D., BCPS []  Alycia Rossetti, Pharm.D., BCPS []  [place], Pharm.D., BCPS, AAHIVP []  Legrand Como, Pharm.D., BCPS, AAHIVP []  Salome Arnt, PharmD, BCPS []  Johnnette Gourd, PharmD, BCPS []  Hughes Better, PharmD, BCPS []  Leeroy Cha, PharmD []  Laqueta Linden, PharmD, BCPS []  Albertina Parr, PharmD Sherren Kerns, PharmD  [place] Team []  Leodis Sias, PharmD []  Lindell Spar, PharmD []  Royetta Asal, PharmD []  Graylin Shiver, Rph []  Rema Fendt) Glennon Mac, PharmD []  Arlyn Dunning, PharmD []  Netta Cedars, PharmD []  Dia Sitter, PharmD []  Leone Haven, PharmD []  Gretta Arab, PharmD []  Theodis Shove, PharmD []  Peggyann Juba, PharmD []  Reuel Boom, PharmD   Positive urine culture Treated with Cephalexin, organism sensitive to the same and no further patient follow-up is required at this time.  Harlon Flor [facility] 12/09/2019, 11:45 AM

## 2019-12-10 LAB — CULTURE, BLOOD (ROUTINE X 2)
Culture: NO GROWTH
Culture: NO GROWTH
Special Requests: ADEQUATE
Special Requests: ADEQUATE

## 2019-12-10 LAB — CULTURE, BODY FLUID W GRAM STAIN -BOTTLE: Culture: NO GROWTH

## 2019-12-13 ENCOUNTER — Other Ambulatory Visit: Payer: Self-pay | Admitting: Family Medicine

## 2019-12-16 ENCOUNTER — Encounter: Payer: Self-pay | Admitting: Orthopedic Surgery

## 2019-12-16 ENCOUNTER — Ambulatory Visit (INDEPENDENT_AMBULATORY_CARE_PROVIDER_SITE_OTHER): Payer: PPO | Admitting: Orthopedic Surgery

## 2019-12-16 ENCOUNTER — Other Ambulatory Visit: Payer: Self-pay

## 2019-12-16 VITALS — BP 96/64 | HR 69 | Ht 66.0 in | Wt 170.0 lb

## 2019-12-16 DIAGNOSIS — M1A0610 Idiopathic chronic gout, right knee, without tophus (tophi): Secondary | ICD-10-CM | POA: Diagnosis not present

## 2019-12-16 MED ORDER — COLCHICINE 0.6 MG PO TABS: 0.6000 mg | ORAL_TABLET | Freq: Two times a day (BID) | ORAL | 5 refills | Status: DC

## 2019-12-16 NOTE — Progress Notes (Signed)
NEW PROBLEM//OFFICE VISIT  Chief Complaint  Patient presents with  . Knee Pain    right/ feels better     74 year old female with chronic knee problems presented to the ER on April 22 with a swollen painful knee and a fever. Her fever work-up found out that she had E. coli positive UA sensitive to Cipro and she had fluid drawn from her right knee which showed no growth after 5 days positive intracellular monosodium urate crystals 10,000 white count 94% neutrophils and a negative Gram stain she was started on colchicine and comes in today asymptomatic   Review of Systems  Constitutional: Positive for fever. Negative for chills.  Skin: Negative for rash.  Neurological: Negative for tingling.     Past Medical History:  Diagnosis Date  . Anxiety   . Aortic stenosis   . Arthritis   . Chronic combined systolic and diastolic CHF (congestive heart failure) ([place])   . CKD (chronic kidney disease) stage 3, GFR 30-59 ml/min   . Constipation   . Depression   . Diabetes mellitus, type 2 ([place])   . Essential hypertension, benign   . Hyperlipidemia   . Incidental pulmonary nodule, > 5m and < 833m   7 x 4 mm LLL nodule  . LBBB (left bundle branch block)   . Nonischemic cardiomyopathy (HCC)    No significant obstructive CAD at heart catheterization 05/2014, LVEF 20%  . Obesity   . Pneumonia 2013  . 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 Symptomatic PVCs     Past Surgical History:  Procedure Laterality Date  . BIV ICD INSERTION CRT-D N/A 10/28/2019   Procedure: BIV ICD INSERTION CRT-D;  Surgeon: TaEvans LanceMD;  [facility];  Service: Cardiovascular;  Laterality: N/A;  . CARDIAC CATHETERIZATION    . CESAREAN SECTION  1987  . FLEXIBLE BRONCHOSCOPY N/A 01/14/2015   Procedure: FLEXIBLE BRONCHOSCOPY;  Surgeon: StMelrose NakayamaMD;  [facility]  Service: Thoracic;  Laterality: N/A;  . LEFT AND RIGHT HEART CATHETERIZATION WITH CORONARY ANGIOGRAM N/A 12/05/2011   Procedure: LEFT AND RIGHT HEART CATHETERIZATION WITH CORONARY ANGIOGRAM;  Surgeon: ChBurnell BlanksMD;  [facility];  Service: Cardiovascular;  Laterality: N/A;  . LEFT AND RIGHT HEART CATHETERIZATION WITH CORONARY ANGIOGRAM N/A 05/23/2014   Procedure: LEFT AND RIGHT HEART CATHETERIZATION WITH CORONARY ANGIOGRAM;  Surgeon: ChBurnell BlanksMD;  [facility];  Service: Cardiovascular;  Laterality: N/A;  . MULTIPLE EXTRACTIONS WITH ALVEOLOPLASTY N/A 02/27/2014   Procedure: Extraction of tooth #'s 2,4,5,6,7,8,9,10,11,12, 22, 23, 24, 25, 26, 28 with alveoloplasty and bilateral mandibular tori reductions;  Surgeon: RoLenn CalDDS;  [facility]: Oral Surgery;  Laterality: N/A;  . RIGID BRONCHOSCOPY N/A 01/14/2015   Procedure: RIGID BRONCHOSCOPY with Removal Of Foreign Body ;  Surgeon: StMelrose NakayamaMD;  [facility] Service: Thoracic;  Laterality: N/A;  . TEE WITHOUT CARDIOVERSION N/A 03/24/2015   Procedure: TRANSESOPHAGEAL ECHOCARDIOGRAM (TEE);  Surgeon: ChBurnell BlanksMD;  [facility]: Open Heart Surgery;  Laterality: N/A;  . TRANSCATHETER AORTIC VALVE REPLACEMENT, TRANSFEMORAL Bilateral 03/24/2015   Procedure: TRANSCATHETER AORTIC VALVE REPLACEMENT, TRANSFEMORAL;  Surgeon: ChBurnell BlanksMD;  [facility] Service: Open [facility] Surgery;  Laterality: Bilateral;  . TUBAL LIGATION  1987  . VIDEO BRONCHOSCOPY Bilateral  06/05/2014   Procedure: VIDEO BRONCHOSCOPY WITHOUT FLUORO;  Surgeon: [name], MD;  Location: [place];  Service: Cardiopulmonary;  Laterality: Bilateral;    Family History  Problem Relation Age of Onset  . Heart disease Mother   . Heart attack Mother 15  . Diabetes Mother   . Hypertension Mother   . Colon cancer Father 31  . Breast cancer Sister   . Diabetes  Brother   . Hypertension Brother   . Stroke Brother   . Cancer Brother   . Congestive Heart Failure Daughter   . Hypertension Son   . High Cholesterol Son   . Congestive Heart Failure Son    Social History   Tobacco Use  . Smoking status: Never Smoker  . Smokeless tobacco: Never Used  Substance Use Topics  . Alcohol use: No  . Drug use: No    Allergies  Allergen Reactions  . Crestor [Rosuvastatin] Other (See Comments)    Muscle aches and elevated CK  . Tramadol Nausea Only    States blood sugar elevated also  . Penicillins Rash and Other (See Comments)    Family unsure of reaction, but certain mom said she was allergic Did it involve swelling of the face/tongue/throat, SOB, or low BP? No Did it involve sudden or severe rash/hives, skin peeling, or any reaction on the inside of your mouth or nose? Yes Did you need to seek medical attention at a hospital or doctor's office? Yes When did it last happen?10 + years If all above answers are "NO", may proceed with cephalosporin use.     Current Meds  Medication Sig  . acetaminophen (TYLENOL) 500 MG tablet Take one tablet two times daily, by mouth, for knee  pain (Patient taking differently: Take 500 mg by mouth 2 (two) times daily as needed for moderate pain. Take one tablet two times daily, by mouth, for knee  pain)  . aspirin EC 81 MG tablet Take 81 mg by mouth every morning.   . blood glucose meter kit and supplies Dispense based on patient and insurance preference. Use to test three times daily (FOR ICD-10 E10.9, E11.9).  . carvedilol (COREG) 25 MG tablet TAKE 1 TABLET BY MOUTH TWICE DAILY WITH MEALS. (Patient taking differently: Take 25 mg by mouth 2 (two) times daily with a meal. )  . colchicine 0.6 MG tablet Take 1 tablet (0.6 mg total) by mouth 2 (two) times daily. Take with food  . diclofenac Sodium (VOLTAREN) 1 % GEL Apply 1 application topically 4 (four) times daily as needed (pain).  Marland Kitchen ezetimibe (ZETIA) 10 MG  tablet TAKE ONE TABLET BY MOUTH ONCE DAILY. (Patient taking differently: Take 10 mg by mouth daily. )  . fenofibrate (TRICOR) 145 MG tablet TAKE (1) TABLET BY MOUTH ONCE DAILY. (Patient taking differently: Take 145 mg by mouth daily. )  . furosemide (LASIX) 40 MG tablet TAKE (1) TABLET BY MOUTH EACH MORNING. (Patient taking differently: Take 40 mg by mouth daily. )  . gabapentin (NEURONTIN) 100 MG capsule Take 1 capsule (100 mg total) by mouth 3 (three) times daily. (Patient taking differently: Take 100 mg by mouth 3 (three) times daily as needed. )  . glipiZIDE (GLIPIZIDE XL) 5 MG 24 hr tablet Take 1 tablet (5 mg total) by mouth daily with breakfast.  . insulin aspart protamine - aspart (NOVOLOG MIX 70/30 FLEXPEN) (70-30) 100 UNIT/ML FlexPen INJECT 12 TO 15 UNITS SUBCUTANEOUSLY TWICE DAILY. (Patient taking differently: Inject 12-15 Units into the skin  See admin instructions. Inject 15 units in the morning and 12 units in the evening)  . Insulin Pen Needle (LITETOUCH PEN NEEDLES) 31G X 8 MM MISC USE AS NEEDED TO INJECT INSULIN.  Marland Kitchen Lancets (ONETOUCH DELICA PLUS [id number]) MISC USE 3 TIMES A DAY AS DIRECTED.  Marland Kitchen LIVALO 2 MG TABS TAKE ONE TABLET BY MOUTH ONCE DAILY AFTER SUPPER. (Patient taking differently: Take 2 mg by mouth at bedtime. )  . Multiple Vitamins-Minerals (MULTIVITAMIN PO) Take 1 tablet by mouth every morning.   . niacin (NIASPAN) 500 MG CR tablet TAKE (1) TABLET BY MOUTH AT BEDTIME. (Patient taking differently: Take 500 mg by mouth at bedtime. )  . ONETOUCH ULTRA test strip CHECK BLOOD SUGAR 3 TIMES A DAY.  Marland Kitchen potassium chloride (KLOR-CON) 10 MEQ tablet TAKE ONE TABLET BY MOUTH DAILY. DO NOT TAKE IF NOT TAKING FUROSEMIDE. (Patient taking differently: Take 10 mEq by mouth daily. )  . spironolactone (ALDACTONE) 25 MG tablet Take 1 tablet (25 mg total) by mouth daily.  Marland Kitchen UNABLE TO FIND Shower bench x 1   DX M17.12  . UNABLE TO FIND Rolling walker with seat x 1  DX m17.12  . [DISCONTINUED]  cephALEXin (KEFLEX) 500 MG capsule Take 1 capsule (500 mg total) by mouth 2 (two) times daily.  . [DISCONTINUED] colchicine 0.6 MG tablet Take 1 tablet (0.6 mg total) by mouth 2 (two) times daily. Take with food  . [DISCONTINUED] HYDROcodone-acetaminophen (NORCO/VICODIN) 5-325 MG tablet Take 1 tablet by mouth every 4 (four) hours as needed. (Patient taking differently: Take 1 tablet by mouth every 4 (four) hours as needed for moderate pain. )    BP 96/64   Pulse 69   Ht 5' 6"  (1.676 m)   Wt 170 lb (77.1 kg)   BMI 27.44 kg/m   Physical Exam Vitals and nursing note reviewed.  Constitutional:      Appearance: Normal appearance.  Musculoskeletal:     Right knee: No effusion.     Instability Tests: Negative anterior drawer test.     Left knee: No effusion.     Instability Tests: Negative anterior drawer test.  Neurological:     Mental Status: She is alert and oriented to person, place, and time.  Psychiatric:        Mood and Affect: Mood normal.     Right Knee Exam   Muscle Strength  The patient has normal right knee strength.  Tenderness  The patient is experiencing no tenderness.   Range of Motion  Extension: normal  Flexion: normal   Tests  Drawer:  Anterior - negative      Other  Erythema: absent Sensation: normal Pulse: present Effusion: no effusion present   Left Knee Exam   Muscle Strength  The patient has normal left knee strength.  Tenderness  The patient is experiencing no tenderness.   Range of Motion  Extension: normal  Flexion: normal   Tests  Drawer:  Anterior - negative       Other  Erythema: absent Sensation: normal Pulse: present Effusion: no effusion present        MEDICAL DECISION MAKING  A.  Encounter Diagnosis  Name Primary?  . Idiopathic chronic gout of right knee without tophus Yes    B. DATA ANALYSED:  Yes, 4 tests were reviewed Gram stain negative fluid analysis monosodium urate positive intracellular for  crystals, with a 10,000 white count 94% neutrophils and culture negative for 5 days and urinalysis positive for E. coli  IMAGING: Independent interpretation of images: No images  Orders: Prescription  Outside records reviewed: Emergency room records indicate patient had a 2 to 3-day history of recurrent pain and swelling right knee with a medical history of CHF chronic kidney disease diabetes had an aortic valve replacement and has a defibrillator she had a fever one on one on arrival  C. MANAGEMENT   Patient is now asymptomatic seems like she had a gout attack along with a urinary tract infection. This should be easily managed with refilling of the colchicine and taking her medicine when her knee is  Follow-up as needed  Meds ordered this encounter  Medications  . colchicine 0.6 MG tablet    Sig: Take 1 tablet (0.6 mg total) by mouth 2 (two) times daily. Take with food    Dispense:  60 tablet    Refill:  [place], MD  12/16/2019 9:07 AM

## 2019-12-16 NOTE — Patient Instructions (Addendum)
Take colchicine twice a day if knee swells are gets warm to touch     Gout  Gout is painful swelling of your joints. Gout is a type of arthritis. It is caused by having too much uric acid in your body. Uric acid is a chemical that is made when your body breaks down substances called purines. If your body has too much uric acid, sharp crystals can form and build up in your joints. This causes pain and swelling. Gout attacks can happen quickly and be very painful (acute gout). Over time, the attacks can affect more joints and happen more often (chronic gout). What are the causes?  Too much uric acid in your blood. This can happen because: ? Your kidneys do not remove enough uric acid from your blood. ? Your body makes too much uric acid. ? You eat too many foods that are high in purines. These foods include organ meats, some seafood, and beer.  Trauma or stress. What increases the risk?  Having a family history of gout.  Being female and middle-aged.  Being female and having gone through menopause.  Being very overweight (obese).  Drinking alcohol, especially beer.  Not having enough water in the body (being dehydrated).  Losing weight too quickly.  Having an organ transplant.  Having lead poisoning.  Taking certain medicines.  Having kidney disease.  Having a skin condition called psoriasis. What are the signs or symptoms? An attack of acute gout usually happens in just one joint. The most common place is the big toe. Attacks often start at night. Other joints that may be affected include joints of the feet, ankle, knee, fingers, wrist, or elbow. Symptoms of an attack may include:  Very bad pain.  Warmth.  Swelling.  Stiffness.  Shiny, red, or purple skin.  Tenderness. The affected joint may be very painful to touch.  Chills and fever. Chronic gout may cause symptoms more often. More joints may be involved. You may also have white or yellow lumps (tophi) on your  hands or feet or in other areas near your joints. How is this treated?  Treatment for this condition has two phases: treating an acute attack and preventing future attacks.  Acute gout treatment may include: ? NSAIDs. ? Steroids. These are taken by mouth or injected into a joint. ? Colchicine. This medicine relieves pain and swelling. It can be given by mouth or through an IV tube.  Preventive treatment may include: ? Taking small doses of NSAIDs or colchicine daily. ? Using a medicine that reduces uric acid levels in your blood. ? Making changes to your diet. You may need to see a food expert (dietitian) about what to eat and drink to prevent gout. Follow these instructions at home: During a gout attack   If told, put ice on the painful area: ? Put ice in a plastic bag. ? Place a towel between your skin and the bag. ? Leave the ice on for 20 minutes, 2-3 times a day.  Raise (elevate) the painful joint above the level of your heart as often as you can.  Rest the joint as much as possible. If the joint is in your leg, you may be given crutches.  Follow instructions from your doctor about what you cannot eat or drink. Avoiding future gout attacks  Eat a low-purine diet. Avoid foods and drinks such as: ? Liver. ? Kidney. ? Anchovies. ? Asparagus. ? Herring. ? Mushrooms. ? Mussels. ? Beer.  Stay at  a healthy weight. If you want to lose weight, talk with your doctor. Do not lose weight too fast.  Start or continue an exercise plan as told by your doctor. Eating and drinking  Drink enough fluids to keep your pee (urine) pale yellow.  If you drink alcohol: ? Limit how much you use to:  0-1 drink a day for women.  0-2 drinks a day for men. ? Be aware of how much alcohol is in your drink. In the [place] one drink equals one 12 oz bottle of beer (355 mL), one 5 oz glass of wine (148 mL), or one 1 oz glass of hard liquor (44 mL). General instructions  Take over-the-counter  and prescription medicines only as told by your doctor.  Do not drive or use heavy machinery while taking prescription pain medicine.  Return to your normal activities as told by your doctor. Ask your doctor what activities are safe for you.  Keep all follow-up visits as told by your doctor. This is important. Contact a doctor if:  You have another gout attack.  You still have symptoms of a gout attack after 10 days of treatment.  You have problems (side effects) because of your medicines.  You have chills or a fever.  You have burning pain when you pee (urinate).  You have pain in your lower back or belly. Get help right away if:  You have very bad pain.  Your pain cannot be controlled.  You cannot pee. Summary  Gout is painful swelling of the joints.  The most common site of pain is the big toe, but it can affect other joints.  Medicines and avoiding some foods can help to prevent and treat gout attacks. This information is not intended to replace advice given to you by your health care provider. Make sure you discuss any questions you have with your health care provider. Document Revised: 02/21/2018 Document Reviewed: 02/21/2018 Elsevier Patient Education  [place].

## 2019-12-17 ENCOUNTER — Other Ambulatory Visit: Payer: Self-pay | Admitting: Family Medicine

## 2019-12-19 ENCOUNTER — Ambulatory Visit (INDEPENDENT_AMBULATORY_CARE_PROVIDER_SITE_OTHER): Payer: PPO | Admitting: Cardiology

## 2019-12-19 ENCOUNTER — Encounter: Payer: Self-pay | Admitting: Cardiology

## 2019-12-19 ENCOUNTER — Other Ambulatory Visit: Payer: Self-pay

## 2019-12-19 VITALS — BP 126/74 | HR 74 | Ht 66.0 in | Wt 171.0 lb

## 2019-12-19 DIAGNOSIS — I428 Other cardiomyopathies: Secondary | ICD-10-CM

## 2019-12-19 DIAGNOSIS — I5042 Chronic combined systolic (congestive) and diastolic (congestive) heart failure: Secondary | ICD-10-CM | POA: Diagnosis not present

## 2019-12-19 DIAGNOSIS — Z952 Presence of prosthetic heart valve: Secondary | ICD-10-CM | POA: Diagnosis not present

## 2019-12-19 DIAGNOSIS — Z95810 Presence of automatic (implantable) cardiac defibrillator: Secondary | ICD-10-CM | POA: Diagnosis not present

## 2019-12-19 NOTE — Patient Instructions (Signed)
Medication Instructions: Your physician recommends that you continue on your current medications as directed. Please refer to the Current Medication list given to you today.   Labwork: None today  Procedures/Testing: Your physician has requested that you have an echocardiogram. Echocardiography is a painless test that uses sound waves to create images of your heart. It provides your doctor with information about the size and shape of your heart and how well your heart's chambers and valves are working. This procedure takes approximately one hour. There are no restrictions for this procedure.    Follow-Up: 3 months office visit with Bernerd Pho, PA-C   Any Additional Special Instructions Will Be Listed Below (If Applicable).     If you need a refill on your cardiac medications before your next appointment, please call your pharmacy.          Thank you for choosing [facility] !

## 2019-12-19 NOTE — Progress Notes (Signed)
Cardiology Office Note  Date: 12/19/2019   ID: Conny, Situ 16-Mar-1946, MRN [id number]  PCP:  Fayrene Helper, MD  Cardiologist:  Rozann Lesches, MD Electrophysiologist:  None   Chief Complaint  Patient presents with  . Cardiac follow-up    History of Present Illness: SASCHA PALMA is a 74 y.o. female last seen in February by Ms. Strader PA-C.  She presents for a follow-up visit.  States that she has been doing about the same, generally NYHA class II-III dyspnea depending on level of activity, no angina symptoms, no palpitations or syncope.  She had a visit with [name] in February for discussion of possible CRT-D.  She ultimately underwent placement of a Medtronic device in mid March. Her last echocardiogram was in 2019 at which point LVEF was in the 15 to 20% range.  I reviewed her current medications which are outlined below and stable from a cardiac perspective.  Past Medical History:  Diagnosis Date  . Anxiety   . Aortic stenosis   . Arthritis   . Biventricular ICD (implantable cardioverter-defibrillator) in place    March 2021, Medtronic  -[name]  . Chronic combined systolic and diastolic CHF (congestive heart failure) ([place])   . CKD (chronic kidney disease) stage 3, GFR 30-59 ml/min   . Constipation   . Depression   . Diabetes mellitus, type 2 ([place])   . Essential hypertension   . Hyperlipidemia   . Incidental pulmonary nodule, > 54m and < 833m   7 x 4 mm LLL nodule  . LBBB (left bundle branch block)   . Nonischemic cardiomyopathy (HCC)    No significant obstructive CAD at heart catheterization 05/2014, LVEF 20%  . Obesity   . Pneumonia 2013  . 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 Symptomatic PVCs     Past Surgical History:  Procedure Laterality Date  . BIV ICD INSERTION CRT-D N/A 10/28/2019   Procedure: BIV ICD  INSERTION CRT-D;  Surgeon: TaEvans LanceMD;  [facility];  Service: Cardiovascular;  Laterality: N/A;  . CARDIAC CATHETERIZATION    . CESAREAN SECTION  1987  . FLEXIBLE BRONCHOSCOPY N/A 01/14/2015   Procedure: FLEXIBLE BRONCHOSCOPY;  Surgeon: StMelrose NakayamaMD;  [facility] Service: Thoracic;  Laterality: N/A;  . LEFT AND RIGHT HEART CATHETERIZATION WITH CORONARY ANGIOGRAM N/A 12/05/2011   Procedure: LEFT AND RIGHT HEART CATHETERIZATION WITH CORONARY ANGIOGRAM;  Surgeon: ChBurnell BlanksMD;  [facility];  Service: Cardiovascular;  Laterality: N/A;  . LEFT AND RIGHT HEART CATHETERIZATION WITH CORONARY ANGIOGRAM N/A 05/23/2014   Procedure: LEFT AND RIGHT HEART CATHETERIZATION WITH CORONARY ANGIOGRAM;  Surgeon: ChBurnell BlanksMD;  [facility];  Service: Cardiovascular;  Laterality: N/A;  . MULTIPLE EXTRACTIONS WITH ALVEOLOPLASTY N/A 02/27/2014   Procedure: Extraction of tooth #'s 2,4,5,6,7,8,9,10,11,12, 22, 23, 24, 25, 26, 28 with alveoloplasty and bilateral mandibular tori reductions;  Surgeon: RoLenn CalDDS;  [place] Service: Oral Surgery;  Laterality: N/A;  . RIGID BRONCHOSCOPY N/A 01/14/2015   Procedure: RIGID BRONCHOSCOPY with Removal Of Foreign Body ;  Surgeon: StMelrose NakayamaMD;  [place] Service: Thoracic;  Laterality: N/A;  . TEE WITHOUT CARDIOVERSION N/A 03/24/2015   Procedure: TRANSESOPHAGEAL ECHOCARDIOGRAM (TEE);  Surgeon: ChBurnell BlanksMD;  [facility] Service:  Open Heart Surgery;  Laterality: N/A;  . TRANSCATHETER AORTIC VALVE REPLACEMENT, TRANSFEMORAL Bilateral 03/24/2015   Procedure: TRANSCATHETER AORTIC VALVE REPLACEMENT, TRANSFEMORAL;  Surgeon: [name], MD;  Location: [place];  Service: Open Heart Surgery;  Laterality: Bilateral;  . TUBAL LIGATION  1987  . VIDEO BRONCHOSCOPY Bilateral 06/05/2014   Procedure: VIDEO BRONCHOSCOPY WITHOUT FLUORO;  Surgeon: [name], MD;  [facility];  Service: Cardiopulmonary;  Laterality: Bilateral;    Current Outpatient Medications  Medication Sig Dispense Refill  . acetaminophen (TYLENOL) 500 MG tablet Take one tablet two times daily, by mouth, for knee  pain (Patient taking differently: Take 500 mg by mouth 2 (two) times daily as needed for moderate pain. Take one tablet two times daily, by mouth, for knee  pain) 60 tablet 3  . aspirin EC 81 MG tablet Take 81 mg by mouth every morning.     . blood glucose meter kit and supplies Dispense based on patient and insurance preference. Use to test three times daily (FOR ICD-10 E10.9, E11.9). 1 each 0  . carvedilol (COREG) 25 MG tablet TAKE 1 TABLET BY MOUTH TWICE DAILY WITH MEALS. (Patient taking differently: Take 25 mg by mouth 2 (two) times daily with a meal. ) 60 tablet 11  . colchicine 0.6 MG tablet Take 1 tablet (0.6 mg total) by mouth 2 (two) times daily. Take with food 60 tablet 5  . diclofenac Sodium (VOLTAREN) 1 % GEL Apply 1 application topically 4 (four) times daily as needed (pain).    Marland Kitchen ezetimibe (ZETIA) 10 MG tablet TAKE ONE TABLET BY MOUTH ONCE DAILY. (Patient taking differently: Take 10 mg by mouth daily. ) 90 tablet 0  . fenofibrate (TRICOR) 145 MG tablet TAKE (1) TABLET BY MOUTH ONCE DAILY. 30 tablet 0  . furosemide (LASIX) 40 MG tablet TAKE (1) TABLET BY MOUTH EACH MORNING. (Patient taking differently: Take 40 mg by mouth daily. ) 30 tablet 0  . gabapentin (NEURONTIN) 100 MG capsule Take 1 capsule (100 mg total) by mouth 3 (three) times daily. (Patient taking differently: Take 100 mg by mouth 3 (three) times daily as needed. ) 90 capsule 2  . glipiZIDE (GLIPIZIDE XL) 5 MG 24 hr tablet Take 1 tablet (5 mg total) by mouth daily with breakfast. 90 tablet 1  . insulin aspart protamine - aspart (NOVOLOG MIX 70/30 FLEXPEN) (70-30) 100 UNIT/ML FlexPen INJECT 12 TO 15 UNITS SUBCUTANEOUSLY TWICE DAILY. (Patient taking differently: Inject 12-15 Units  into the skin See admin instructions. Inject 15 units in the morning and 12 units in the evening) 15 mL 0  . Insulin Pen Needle (LITETOUCH PEN NEEDLES) 31G X 8 MM MISC USE AS NEEDED TO INJECT INSULIN. 100 each 5  . Lancets (ONETOUCH DELICA PLUS EXHBZJ69C) MISC USE 3 TIMES A DAY AS DIRECTED. 100 each 5  . LIVALO 2 MG TABS TAKE ONE TABLET BY MOUTH ONCE DAILY AFTER SUPPER. (Patient taking differently: Take 2 mg by mouth at bedtime. ) 90 tablet 0  . Multiple Vitamins-Minerals (MULTIVITAMIN PO) Take 1 tablet by mouth every morning.     . niacin (NIASPAN) 500 MG CR tablet TAKE (1) TABLET BY MOUTH AT BEDTIME. (Patient taking differently: Take 500 mg by mouth at bedtime. ) 90 tablet 0  . ONETOUCH ULTRA test strip CHECK BLOOD SUGAR 3 TIMES A DAY. 100 strip 5  . potassium chloride (KLOR-CON) 10 MEQ tablet TAKE ONE TABLET BY MOUTH DAILY. DO NOT TAKE IF NOT TAKING FUROSEMIDE. 30 tablet  0  . spironolactone (ALDACTONE) 25 MG tablet Take 1 tablet (25 mg total) by mouth daily. 90 tablet 1  . UNABLE TO FIND Shower bench x 1   DX M17.12 1 each 0  . UNABLE TO FIND Rolling walker with seat x 1  DX m17.12 1 each 0   No current facility-administered medications for this visit.   Allergies:  Crestor [rosuvastatin], Tramadol, and Penicillins   ROS:  Arthritic knee pains, uses a cane.  Physical Exam: VS:  BP 126/74   Pulse 74   Ht 5' 6" (1.676 m)   Wt 171 lb (77.6 kg)   SpO2 99%   BMI 27.60 kg/m , BMI Body mass index is 27.6 kg/m.  Wt Readings from Last 3 Encounters:  12/19/19 171 lb (77.6 kg)  12/16/19 170 lb (77.1 kg)  12/05/19 167 lb 8.8 oz (76 kg)    General: Elderly woman, appears comfortable at rest. HEENT: Conjunctiva and lids normal, wearing a mask. Neck: Supple, no elevated JVP or carotid bruits, no thyromegaly. Lungs: Clear to auscultation, nonlabored breathing at rest. Cardiac: Indistinct PMI, regular rate and rhythm, no S3, 2/6 systolic murmur. Abdomen: Soft, bowel sounds present, no  guarding or rebound. Extremities: No pitting edema, distal pulses 2+.  ECG:  An ECG dated 12/05/2019 was personally reviewed today and demonstrated:  Dual-chamber paced rhythm with burst of NSVT.  Recent Labwork: 09/10/2019: ALT 17; AST 21; B Natriuretic Peptide 577.0 12/05/2019: BUN 42; Creatinine, Ser 1.79; Hemoglobin 11.5; Magnesium 2.1; Platelets 145; Potassium 3.9; Sodium 134     Component Value Date/Time   CHOL 168 08/20/2019 1001   TRIG 72 08/20/2019 1001   HDL 58 08/20/2019 1001   CHOLHDL 2.9 08/20/2019 1001   VLDL 21 02/06/2017 0916   LDLCALC 94 08/20/2019 1001    Other Studies Reviewed Today:  Echocardiogram 04/25/2018: Study Conclusions   - Left ventricle: The cavity size was mildly dilated. Wall  thickness was increased in a pattern of moderate LVH. Systolic  function was severely reduced. The estimated ejection fraction  was in the range of 15% to 20%. Diffuse hypokinesis. Doppler  parameters are consistent with abnormal left ventricular  relaxation (grade 1 diastolic dysfunction). Doppler parameters  are consistent with high ventricular filling pressure.  - Aortic valve: 23 mm Edwards Sapien 3 transcatheter heart valve is  in the AV position. There was no stenosis. There was no  significant regurgitation. Mean gradient (S): 9 mm Hg.  - Mitral valve: Mildly to moderately calcified annulus. Mildly  thickened leaflets . There was mild regurgitation.  - Left atrium: The atrium was mildly dilated.  - Pulmonary arteries: Systolic pressure was mildly increased. PA  peak pressure: 31 mm Hg (S).  - Technically adequate study.   Assessment and Plan:  1.  Nonischemic cardiomyopathy with LVEF 15 to 20% by last assessment and chronic combined heart failure.  She she appears euvolemic today and has been tolerating her regular medical therapy.  She is now status post Medtronic CRT-D in March per [name].  We will plan to obtain a follow-up echocardiogram.   Continue Coreg, Lasix, Aldactone, and potassium supplement.  She has not been on ARB/Entresto due to chronic kidney disease.  Might be able to consider addition of BiDil or at least low-dose hydralazine depending on her blood pressure going forward.  2.  Medtronic CRT-D in place, no device shocks or syncope.  Last LVEF 15 to 20% and left bundle branch block by baseline ECG.  We will  obtain a follow-up echocardiogram.  She continues to follow with [name].  3.  Aortic stenosis status post TAVR in 2016.  This will also be reevaluated by echocardiogram.  Medication Adjustments/Labs and Tests Ordered: Current medicines are reviewed at length with the patient today.  Concerns regarding medicines are outlined above.   Tests Ordered: Orders Placed This Encounter  Procedures  . ECHOCARDIOGRAM COMPLETE    Medication Changes: No orders of the defined types were placed in this encounter.   Disposition:  Follow up 3 months with [place] in the [place] office.  Signed, [name], MD, [facility] 12/19/2019 1:47 PM    [place] at [facility] [address] [place], [place] [address] Phone: [phone]; Fax: [phone]

## 2019-12-27 ENCOUNTER — Other Ambulatory Visit: Payer: Self-pay | Admitting: Family Medicine

## 2019-12-31 ENCOUNTER — Other Ambulatory Visit: Payer: Self-pay | Admitting: Family Medicine

## 2020-01-02 ENCOUNTER — Ambulatory Visit (HOSPITAL_COMMUNITY)
Admission: RE | Admit: 2020-01-02 | Discharge: 2020-01-02 | Disposition: A | Discharge status: Home / Self Care | Payer: PPO | Source: Ambulatory Visit | Attending: Cardiology | Admitting: Cardiology

## 2020-01-02 ENCOUNTER — Other Ambulatory Visit: Payer: Self-pay

## 2020-01-02 DIAGNOSIS — I428 Other cardiomyopathies: Secondary | ICD-10-CM | POA: Diagnosis not present

## 2020-01-02 NOTE — Progress Notes (Signed)
*  PRELIMINARY RESULTS* Echocardiogram 2D Echocardiogram has been performed.  [name] [name] 01/02/2020, 12:36 PM

## 2020-01-09 ENCOUNTER — Other Ambulatory Visit: Payer: Self-pay | Admitting: Family Medicine

## 2020-01-09 ENCOUNTER — Ambulatory Visit: Payer: PPO | Admitting: Family Medicine

## 2020-01-17 ENCOUNTER — Other Ambulatory Visit: Payer: Self-pay | Admitting: Family Medicine

## 2020-01-27 ENCOUNTER — Ambulatory Visit (INDEPENDENT_AMBULATORY_CARE_PROVIDER_SITE_OTHER): Payer: PPO | Admitting: *Deleted

## 2020-01-27 DIAGNOSIS — I5042 Chronic combined systolic (congestive) and diastolic (congestive) heart failure: Secondary | ICD-10-CM

## 2020-01-28 ENCOUNTER — Other Ambulatory Visit: Payer: Self-pay

## 2020-01-28 ENCOUNTER — Encounter: Payer: Self-pay | Admitting: Family Medicine

## 2020-01-28 ENCOUNTER — Ambulatory Visit (INDEPENDENT_AMBULATORY_CARE_PROVIDER_SITE_OTHER): Payer: PPO | Admitting: Family Medicine

## 2020-01-28 VITALS — BP 120/80 | HR 71 | Temp 98.6°F | Resp 15 | Ht 66.0 in | Wt 179.0 lb

## 2020-01-28 DIAGNOSIS — E785 Hyperlipidemia, unspecified: Secondary | ICD-10-CM

## 2020-01-28 DIAGNOSIS — E1122 Type 2 diabetes mellitus with diabetic chronic kidney disease: Secondary | ICD-10-CM

## 2020-01-28 DIAGNOSIS — I129 Hypertensive chronic kidney disease with stage 1 through stage 4 chronic kidney disease, or unspecified chronic kidney disease: Secondary | ICD-10-CM | POA: Diagnosis not present

## 2020-01-28 DIAGNOSIS — Z1231 Encounter for screening mammogram for malignant neoplasm of breast: Secondary | ICD-10-CM | POA: Diagnosis not present

## 2020-01-28 DIAGNOSIS — I428 Other cardiomyopathies: Secondary | ICD-10-CM

## 2020-01-28 DIAGNOSIS — N184 Chronic kidney disease, stage 4 (severe): Secondary | ICD-10-CM | POA: Diagnosis not present

## 2020-01-28 DIAGNOSIS — I10 Essential (primary) hypertension: Secondary | ICD-10-CM | POA: Diagnosis not present

## 2020-01-28 DIAGNOSIS — Z1211 Encounter for screening for malignant neoplasm of colon: Secondary | ICD-10-CM

## 2020-01-28 DIAGNOSIS — E1121 Type 2 diabetes mellitus with diabetic nephropathy: Secondary | ICD-10-CM | POA: Diagnosis not present

## 2020-01-28 LAB — CUP PACEART REMOTE DEVICE CHECK
Battery Remaining Longevity: 74 mo
Battery Voltage: 3.01 V
Brady Statistic AP VP Percent: 12.63 %
Brady Statistic AP VS Percent: 0.42 %
Brady Statistic AS VP Percent: 84.56 %
Brady Statistic AS VS Percent: 2.39 %
Brady Statistic RA Percent Paced: 12.59 %
Brady Statistic RV Percent Paced: 0.6 %
Date Time Interrogation Session: 20210614033423
HighPow Impedance: 60 Ohm
Implantable Lead Implant Date: 20210315
Implantable Lead Implant Date: 20210315
Implantable Lead Implant Date: 20210315
Implantable Lead Location: 753858
Implantable Lead Location: 753859
Implantable Lead Location: 753860
Implantable Lead Model: 4598
Implantable Lead Model: 5076
Implantable Lead Model: 6935
Implantable Pulse Generator Implant Date: 20210315
Lead Channel Impedance Value: 212.8 Ohm
Lead Channel Impedance Value: 212.8 Ohm
Lead Channel Impedance Value: 231.42 Ohm
Lead Channel Impedance Value: 249.509
Lead Channel Impedance Value: 249.509
Lead Channel Impedance Value: 361 Ohm
Lead Channel Impedance Value: 399 Ohm
Lead Channel Impedance Value: 399 Ohm
Lead Channel Impedance Value: 418 Ohm
Lead Channel Impedance Value: 456 Ohm
Lead Channel Impedance Value: 456 Ohm
Lead Channel Impedance Value: 551 Ohm
Lead Channel Impedance Value: 551 Ohm
Lead Channel Impedance Value: 703 Ohm
Lead Channel Impedance Value: 722 Ohm
Lead Channel Impedance Value: 874 Ohm
Lead Channel Impedance Value: 893 Ohm
Lead Channel Impedance Value: 893 Ohm
Lead Channel Pacing Threshold Amplitude: 0.625 V
Lead Channel Pacing Threshold Amplitude: 0.625 V
Lead Channel Pacing Threshold Pulse Width: 0.4 ms
Lead Channel Pacing Threshold Pulse Width: 0.4 ms
Lead Channel Sensing Intrinsic Amplitude: 16.25 mV
Lead Channel Sensing Intrinsic Amplitude: 3.125 mV
Lead Channel Sensing Intrinsic Amplitude: 3.125 mV
Lead Channel Setting Pacing Amplitude: 3.25 V
Lead Channel Setting Pacing Amplitude: 3.25 V
Lead Channel Setting Pacing Amplitude: 4 V
Lead Channel Setting Pacing Pulse Width: 0.4 ms
Lead Channel Setting Pacing Pulse Width: 0.4 ms
Lead Channel Setting Sensing Sensitivity: 0.3 mV

## 2020-01-28 LAB — POCT GLYCOSYLATED HEMOGLOBIN (HGB A1C): Hemoglobin A1C: 7.3 % — AB (ref 4.0–5.6)

## 2020-01-28 MED ORDER — GLIPIZIDE ER 2.5 MG PO TB24: 2.5000 mg | ORAL_TABLET | Freq: Every day | ORAL | 5 refills | Status: DC

## 2020-01-28 NOTE — Patient Instructions (Addendum)
Follow-up in office with MD in 3-1/2 months call if you need me sooner.  New lower dose of glipizide is 2.5 mg once daily, continue insulin as before.  Please do get the Covid vaccine you do need this to reduce your risk of getting seriously ill should you be exposed to the virus.  Mammogram to be scheduled for mid September at [facility] as you recently have a defibrillator and requests additional time for healing.  Please do get the stool test from home to screen for colon cancer.  Nurse will review how to collect the specimen and mail it off at the time of your discharge.  Fasting lipid CMP and EGFR and hemoglobin A1c 3 to 5 days before your visit in September thank you  Think about what you will eat, plan ahead. Choose " clean, green, fresh or frozen" over canned, processed or packaged foods which are more sugary, salty and fatty. 70 to 75% of food eaten should be vegetables and fruit. Three meals at set times with snacks allowed between meals, but they must be fruit or vegetables. Aim to eat over a 12 hour period , example 7 am to 7 pm, and STOP after  your last meal of the day. Drink water,generally about 64 ounces per day, no other drink is as healthy. Fruit juice is best enjoyed in a healthy way, by EATING the fruit.   Thanks for choosing [facility], we consider it a privelige to serve you.

## 2020-01-28 NOTE — Progress Notes (Signed)
Remote ICD transmission.   

## 2020-01-28 NOTE — Assessment & Plan Note (Signed)
Reports hypoglycemia and needing to snack, hBA1C is 7.3 Reduce glipizide to 2.5 mg [name] [name] is reminded of the importance of commitment to daily physical activity for 30 minutes or more, as able and the need to limit carbohydrate intake to 30 to 60 grams per meal to help with blood sugar control.   The need to take medication as prescribed, test blood sugar as directed, and to call between visits if there is a concern that blood sugar is uncontrolled is also discussed.   [name] [name] is reminded of the importance of daily foot exam, annual eye examination, and good blood sugar, blood pressure and cholesterol control.  Diabetic Labs Latest Ref Rng & Units 12/05/2019 10/25/2019 09/10/2019 08/20/2019 03/26/2019  HbA1c <5.7 % of total Hgb - - - 7.6(H) 7.4(H)  Microalbumin mg/dL - - - 1.5 -  Micro/Creat Ratio <30 mcg/mg creat - - - 9 -  Chol <200 mg/dL - - - 168 162  HDL > OR = 50 mg/dL - - - 58 54  Calc LDL mg/dL (calc) - - - 94 89  Triglycerides <150 mg/dL - - - 72 101  Creatinine 0.44 - 1.00 mg/dL 1.79(H) 1.78(H) 1.61(H) 1.81(H) 1.98(H)   BP/Weight 01/28/2020 12/19/2019 12/16/2019 12/05/2019 11/20/2019 10/28/2019 3/88/8280  Systolic BP 034 917 96 915 056 979 480  Diastolic BP 80 74 64 65 64 70 71  Wt. (Lbs) 179.04 171 170 167.55 168 170 172  BMI 28.9 27.6 27.44 27.04 27.12 27.44 27.76   Foot/eye exam completion dates Latest Ref Rng & Units 08/22/2019 07/31/2017  Eye Exam No Retinopathy - -  Foot exam Order - - -  Foot Form Completion - Done Done

## 2020-02-02 ENCOUNTER — Encounter: Payer: Self-pay | Admitting: Family Medicine

## 2020-02-02 NOTE — Assessment & Plan Note (Signed)
Hyperlipidemia:Low fat diet discussed and encouraged.   Lipid Panel  Lab Results  Component Value Date   CHOL 168 08/20/2019   HDL 58 08/20/2019   LDLCALC 94 08/20/2019   TRIG 72 08/20/2019   CHOLHDL 2.9 08/20/2019     Updated lab needed at/ before next visit.

## 2020-02-02 NOTE — Assessment & Plan Note (Signed)
Controlled, no change in medication  

## 2020-02-02 NOTE — Assessment & Plan Note (Signed)
Clinically stable, no decompensation, continue current medication

## 2020-02-02 NOTE — Progress Notes (Signed)
[name] [name]     MRN: [id number]      DOB: 16-Jul-1946   HPI [name] [name] is here for follow up and re-evaluation of chronic medical conditions, medication management and review of any available recent lab and radiology data.  Preventive health is updated, specifically  Cancer screening and Immunization.   Questions or concerns regarding consultations or procedures which the PT has had in the interim are  addressed. The PT denies any adverse reactions to current medications since the last visit.  C/o hypoglycemia and needing to eat to keep up blood sugar  ROS Denies recent fever or chills. Denies sinus pressure, nasal congestion, ear pain or sore throat. Denies chest congestion, productive cough or wheezing. Denies chest pains, palpitations and leg swelling Denies abdominal pain, nausea, vomiting,diarrhea or constipation.   Denies dysuria, frequency, hesitancy or incontinence. Denies uncontrolled  joint pain, swelling and limitation in mobility. Denies headaches, seizures, numbness, or tingling. Denies depression, anxiety or insomnia. Denies skin break down or rash.   PE  BP 120/80   Pulse 71   Temp 98.6 F (37 C) (Temporal)   Resp 15   Ht 5\' 6"  (1.676 m)   Wt 179 lb 0.6 oz (81.2 kg)   SpO2 96%   BMI 28.90 kg/m   Patient alert and oriented and in no cardiopulmonary distress.  HEENT: No facial asymmetry, EOMI,     Neck supple .  Chest: Clear to auscultation bilaterally.  CVS: S1, S2 systolic  murmurs, no S3.Regular rate.  ABD: Soft non tender.   Ext: No edema  MS: decreased  ROM spine, shoulders, hips and knees.  Skin: Intact, no ulcerations or rash noted.  Psych: Good eye contact, normal affect. Memory intact not anxious or depressed appearing.  CNS: CN 2-12 intact, power,  normal throughout.no focal deficits noted.   Assessment & Plan  Type 2 diabetes mellitus with nephropathy ([place]) Reports hypoglycemia and needing to snack, hBA1C is 7.3 Reduce  glipizide to 2.5 mg [name] [name] is reminded of the importance of commitment to daily physical activity for 30 minutes or more, as able and the need to limit carbohydrate intake to 30 to 60 grams per meal to help with blood sugar control.   The need to take medication as prescribed, test blood sugar as directed, and to call between visits if there is a concern that blood sugar is uncontrolled is also discussed.   [name] [name] is reminded of the importance of daily foot exam, annual eye examination, and good blood sugar, blood pressure and cholesterol control.  Diabetic Labs Latest Ref Rng & Units 12/05/2019 10/25/2019 09/10/2019 08/20/2019 03/26/2019  HbA1c <5.7 % of total Hgb - - - 7.6(H) 7.4(H)  Microalbumin mg/dL - - - 1.5 -  Micro/Creat Ratio <30 mcg/mg creat - - - 9 -  Chol <200 mg/dL - - - 168 162  HDL > OR = 50 mg/dL - - - 58 54  Calc LDL mg/dL (calc) - - - 94 89  Triglycerides <150 mg/dL - - - 72 101  Creatinine 0.44 - 1.00 mg/dL 1.79(H) 1.78(H) 1.61(H) 1.81(H) 1.98(H)   BP/Weight 01/28/2020 12/19/2019 12/16/2019 12/05/2019 11/20/2019 10/28/2019 5/46/5681  Systolic BP 275 170 96 017 494 496 759  Diastolic BP 80 74 64 65 64 70 71  Wt. (Lbs) 179.04 171 170 167.55 168 170 172  BMI 28.9 27.6 27.44 27.04 27.12 27.44 27.76   Foot/eye exam completion dates Latest Ref Rng & Units 08/22/2019 07/31/2017  Eye Exam  No Retinopathy - -  Foot exam Order - - -  Foot Form Completion - Done Done        Essential hypertension, benign Controlled, no change in medication   Nonischemic cardiomyopathy ([place]) Clinically stable, no decompensation, continue current medication  Hyperlipidemia LDL goal <100 Hyperlipidemia:Low fat diet discussed and encouraged.   Lipid Panel  Lab Results  Component Value Date   CHOL 168 08/20/2019   HDL 58 08/20/2019   LDLCALC 94 08/20/2019   TRIG 72 08/20/2019   CHOLHDL 2.9 08/20/2019     Updated lab needed at/ before next visit.

## 2020-02-03 ENCOUNTER — Other Ambulatory Visit: Payer: Self-pay | Admitting: Family Medicine

## 2020-02-10 ENCOUNTER — Telehealth: Payer: PPO

## 2020-02-11 ENCOUNTER — Telehealth: Payer: PPO

## 2020-02-12 ENCOUNTER — Encounter: Payer: PPO | Admitting: Family Medicine

## 2020-02-12 ENCOUNTER — Ambulatory Visit (INDEPENDENT_AMBULATORY_CARE_PROVIDER_SITE_OTHER): Payer: PPO | Admitting: Internal Medicine

## 2020-02-12 ENCOUNTER — Other Ambulatory Visit: Payer: Self-pay

## 2020-02-12 VITALS — BP 140/88 | HR 80 | Ht 66.0 in | Wt 177.0 lb

## 2020-02-12 DIAGNOSIS — Z95810 Presence of automatic (implantable) cardiac defibrillator: Secondary | ICD-10-CM | POA: Diagnosis not present

## 2020-02-12 DIAGNOSIS — I5042 Chronic combined systolic (congestive) and diastolic (congestive) heart failure: Secondary | ICD-10-CM

## 2020-02-12 DIAGNOSIS — I447 Left bundle-branch block, unspecified: Secondary | ICD-10-CM | POA: Diagnosis not present

## 2020-02-12 NOTE — Patient Instructions (Signed)
Medication Instructions:  Your physician recommends that you continue on your current medications as directed. Please refer to the Current Medication list given to you today.  Please get the Covid vaccine   *If you need a refill on your cardiac medications before your next appointment, please call your pharmacy*   Lab Work: NONE  If you have labs (blood work) drawn today and your tests are completely normal, you will receive your results only by: Marland Kitchen MyChart Message (if you have MyChart) OR . A paper copy in the mail If you have any lab test that is abnormal or we need to change your treatment, we will call you to review the results.   Testing/Procedures: NONE    Follow-Up: At [facility], you and your health needs are our priority.  As part of our continuing mission to provide you with exceptional heart care, we have created designated Provider Care Teams.  These Care Teams include your primary Cardiologist (physician) and Advanced Practice Providers (APPs -  Physician Assistants and Nurse Practitioners) who all work together to provide you with the care you need, when you need it.  We recommend signing up for the patient portal called "MyChart".  Sign up information is provided on this After Visit Summary.  MyChart is used to connect with patients for Virtual Visits (Telemedicine).  Patients are able to view lab/test results, encounter notes, upcoming appointments, etc.  Non-urgent messages can be sent to your provider as well.   To learn more about what you can do with MyChart, go to [email or web address].    Your next appointment:   1 year(s)  The format for your next appointment:   In Person  Provider:   Cristopher Peru, MD   Other Instructions Thank you for choosing [facility]!

## 2020-02-12 NOTE — Progress Notes (Signed)
HPI [name] [name] returns today for followup. She is a pleasant 74 yo woman with chronic systolic heart failure and LBBB, s/p Biv ICD insertion. She feels well except for some arthritis. She denies chest pain or sob. No edema. She has not been vaccinated for Covid Allergies  Allergen Reactions  . Crestor [Rosuvastatin] Other (See Comments)    Muscle aches and elevated CK  . Tramadol Nausea Only    States blood sugar elevated also  . Penicillins Rash and Other (See Comments)    Family unsure of reaction, but certain mom said she was allergic Did it involve swelling of the face/tongue/throat, SOB, or low BP? No Did it involve sudden or severe rash/hives, skin peeling, or any reaction on the inside of your mouth or nose? Yes Did you need to seek medical attention at a hospital or doctor's office? Yes When did it last happen?10 + years If all above answers are "NO", may proceed with cephalosporin use.      Current Outpatient Medications  Medication Sig Dispense Refill  . acetaminophen (TYLENOL) 500 MG tablet Take one tablet two times daily, by mouth, for knee  pain (Patient taking differently: Take 500 mg by mouth 2 (two) times daily as needed for moderate pain. Take one tablet two times daily, by mouth, for knee  pain) 60 tablet 3  . aspirin EC 81 MG tablet Take 81 mg by mouth every morning.     . blood glucose meter kit and supplies Dispense based on patient and insurance preference. Use to test three times daily (FOR ICD-10 E10.9, E11.9). 1 each 0  . carvedilol (COREG) 25 MG tablet TAKE 1 TABLET BY MOUTH TWICE DAILY WITH MEALS. (Patient taking differently: Take 25 mg by mouth 2 (two) times daily with a meal. ) 60 tablet 11  . colchicine 0.6 MG tablet Take 1 tablet (0.6 mg total) by mouth 2 (two) times daily. Take with food 60 tablet 5  . diclofenac Sodium (VOLTAREN) 1 % GEL Apply 1 application topically 4 (four) times daily as needed (pain).    Marland Kitchen ezetimibe (ZETIA) 10 MG  tablet TAKE ONE TABLET BY MOUTH ONCE DAILY. (Patient taking differently: Take 10 mg by mouth daily. ) 90 tablet 0  . fenofibrate (TRICOR) 145 MG tablet TAKE (1) TABLET BY MOUTH ONCE DAILY. 30 tablet 0  . furosemide (LASIX) 40 MG tablet TAKE (1) TABLET BY MOUTH EACH MORNING. 30 tablet 0  . gabapentin (NEURONTIN) 100 MG capsule Take 1 capsule (100 mg total) by mouth 3 (three) times daily. (Patient taking differently: Take 100 mg by mouth 3 (three) times daily as needed. ) 90 capsule 2  . glipiZIDE (GLUCOTROL XL) 2.5 MG 24 hr tablet Take 1 tablet (2.5 mg total) by mouth daily with breakfast. 30 tablet 5  . Insulin Pen Needle (LITETOUCH PEN NEEDLES) 31G X 8 MM MISC USE AS NEEDED TO INJECT INSULIN. 100 each 5  . Lancets (ONETOUCH DELICA PLUS FMBWGY65L) MISC USE 3 TIMES A DAY AS DIRECTED. 100 each 5  . LIVALO 2 MG TABS TAKE ONE TABLET BY MOUTH ONCE DAILY AFTER SUPPER. (Patient taking differently: Take 2 mg by mouth at bedtime. ) 90 tablet 0  . Multiple Vitamins-Minerals (MULTIVITAMIN PO) Take 1 tablet by mouth every morning.     . niacin (NIASPAN) 500 MG CR tablet TAKE (1) TABLET BY MOUTH AT BEDTIME. (Patient taking differently: Take 500 mg by mouth at bedtime. ) 90 tablet 0  . Puako  70/30 FLEXPEN (70-30) 100 UNIT/ML FlexPen INJECT 12 TO 15 UNITS SUBCUTANEOUSLY TWICE DAILY. 15 mL 0  . ONETOUCH ULTRA test strip CHECK BLOOD SUGAR 3 TIMES A DAY. 100 strip 5  . potassium chloride (KLOR-CON) 10 MEQ tablet TAKE ONE TABLET BY MOUTH DAILY. DO NOT TAKE IF NOT TAKING FUROSEMIDE. 30 tablet 0  . spironolactone (ALDACTONE) 25 MG tablet TAKE ONE TABLET BY MOUTH ONCE DAILY. 90 tablet 0  . UNABLE TO FIND Shower bench x 1   DX M17.12 1 each 0  . UNABLE TO FIND Rolling walker with seat x 1  DX m17.12 1 each 0   No current facility-administered medications for this visit.     Past Medical History:  Diagnosis Date  . Anxiety   . Aortic stenosis   . Arthritis   . Biventricular ICD (implantable  cardioverter-defibrillator) in place    March 2021, [employer]  -[name]  . Chronic combined systolic and diastolic CHF (congestive heart failure) ([place])   . CKD (chronic kidney disease) stage 3, GFR 30-59 ml/min   . Constipation   . Depression   . Diabetes mellitus, type 2 ([place])   . Essential hypertension   . Hyperlipidemia   . Incidental pulmonary nodule, > 49m and < 841m   7 x 4 mm LLL nodule  . LBBB (left bundle branch block)   . Nonischemic cardiomyopathy (H[facility])    No significant obstructive CAD at heart catheterization 05/2014, LVEF 20%  . Obesity   . Pneumonia 2013  . 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 Symptomatic PVCs     ROS:   All systems reviewed and negative except as noted in the HPI.   Past Surgical History:  Procedure Laterality Date  . BIV ICD INSERTION CRT-D N/A 10/28/2019   Procedure: BIV ICD INSERTION CRT-D;  Surgeon: TaEvans LanceMD;  [facility];  Service: Cardiovascular;  Laterality: N/A;  . CARDIAC CATHETERIZATION    . CESAREAN SECTION  1987  . FLEXIBLE BRONCHOSCOPY N/A 01/14/2015   Procedure: FLEXIBLE BRONCHOSCOPY;  Surgeon: StMelrose NakayamaMD;  [facility] Service: Thoracic;  Laterality: N/A;  . LEFT AND RIGHT HEART CATHETERIZATION WITH CORONARY ANGIOGRAM N/A 12/05/2011   Procedure: LEFT AND RIGHT HEART CATHETERIZATION WITH CORONARY ANGIOGRAM;  Surgeon: ChBurnell BlanksMD;  [facility];  Service: Cardiovascular;  Laterality: N/A;  . LEFT AND RIGHT HEART CATHETERIZATION WITH CORONARY ANGIOGRAM N/A 05/23/2014   Procedure: LEFT AND RIGHT HEART CATHETERIZATION WITH CORONARY ANGIOGRAM;  Surgeon: ChBurnell BlanksMD;  [facility];  Service: Cardiovascular;  Laterality: N/A;  . MULTIPLE EXTRACTIONS WITH ALVEOLOPLASTY N/A 02/27/2014   Procedure: Extraction of tooth #'s  2,4,5,6,7,8,9,10,11,12, 22, 23, 24, 25, 26, 28 with alveoloplasty and bilateral mandibular tori reductions;  Surgeon: RoLenn CalDDS;  [facility] Service: Oral Surgery;  Laterality: N/A;  . RIGID BRONCHOSCOPY N/A 01/14/2015   Procedure: RIGID BRONCHOSCOPY with Removal Of Foreign Body ;  Surgeon: StMelrose NakayamaMD;  [place] [facility] Service: Thoracic;  Laterality: N/A;  . TEE WITHOUT CARDIOVERSION N/A 03/24/2015   Procedure: TRANSESOPHAGEAL ECHOCARDIOGRAM (TEE);  Surgeon: ChBurnell BlanksMD;  [facility] Service: Open Heart Surgery;  Laterality: N/A;  . TRANSCATHETER AORTIC VALVE REPLACEMENT, TRANSFEMORAL Bilateral 03/24/2015   Procedure: TRANSCATHETER AORTIC VALVE REPLACEMENT, TRANSFEMORAL;  Surgeon: ChAnnita Brod  [name], MD;  Location: [facility];  Service: Open Heart Surgery;  Laterality: Bilateral;  . TUBAL LIGATION  1987  . VIDEO BRONCHOSCOPY Bilateral 06/05/2014   Procedure: VIDEO BRONCHOSCOPY WITHOUT FLUORO;  Surgeon: [name], MD;  Location[facility]COPY;  Service: Cardiopulmonary;  Laterality: Bilateral;     Family History  Problem Relation Age of Onset  . Heart disease Mother   . Heart attack Mother 22  . Diabetes Mother   . Hypertension Mother   . Colon cancer Father 6  . Breast cancer Sister   . Diabetes Brother   . Hypertension Brother   . Stroke Brother   . Cancer Brother   . Congestive Heart Failure Daughter   . Hypertension Son   . High Cholesterol Son   . Congestive Heart Failure Son      Social History   Socioeconomic History  . Marital status: Widowed    Spouse name: Not on file  . Number of children: 5  . Years of education: Not on file  . Highest education level: Not on file  Occupational History  . Occupation: Retired  Tobacco Use  . Smoking status: Never Smoker  . Smokeless tobacco: Never Used  Vaping Use  . Vaping Use: Never used  Substance and Sexual Activity  . Alcohol use: No  . Drug use: No  . Sexual  activity: Never    Birth control/protection: None, Post-menopausal  Other Topics Concern  . Not on file  Social History Narrative  . Not on file   Social Determinants of Health   Financial Resource Strain:   . Difficulty of Paying Living Expenses:   Food Insecurity:   . Worried About Charity fundraiser in the Last Year:   . Arboriculturist in the Last Year:   Transportation Needs:   . Film/video editor (Medical):   Marland Kitchen Lack of Transportation (Non-Medical):   Physical Activity:   . Days of Exercise per Week:   . Minutes of Exercise per Session:   Stress:   . Feeling of Stress :   Social Connections:   . Frequency of Communication with Friends and Family:   . Frequency of Social Gatherings with Friends and Family:   . Attends Religious Services:   . Active Member of Clubs or Organizations:   . Attends Archivist Meetings:   Marland Kitchen Marital Status:   Intimate Partner Violence:   . Fear of Current or Ex-Partner:   . Emotionally Abused:   Marland Kitchen Physically Abused:   . Sexually Abused:      BP 140/88   Pulse 80   Ht 5' 6"  (1.676 m)   Wt 177 lb (80.3 kg)   SpO2 98%   BMI 28.57 kg/m   Physical Exam:  Well appearing NAD HEENT: Unremarkable Neck:  No JVD, no thyromegally Lymphatics:  No adenopathy Back:  No CVA tenderness Lungs:  Clear with no wheezes HEART:  Regular rate rhythm, no murmurs, no rubs, no clicks Abd:  soft, positive bowel sounds, no organomegally, no rebound, no guarding Ext:  2 plus pulses, no edema, no cyanosis, no clubbing Skin:  No rashes no nodules Neuro:  CN II through XII intact, motor grossly intact  DEVICE  Normal device function.  See PaceArt for details.   Assess/Plan: 1. Chronic systolic heart failure - she is doing well, with class 2 symptoms. 2. Biv ICD - her medtronic biv ICD is working normally.  3. Covid 19 - I have asked  her to get vaccinated. 4. HTN - her bp is minimally elevated. She is encouraged to avoid salty  foods.  Mikle Bosworth.D.

## 2020-02-17 ENCOUNTER — Other Ambulatory Visit: Payer: Self-pay | Admitting: Family Medicine

## 2020-02-25 ENCOUNTER — Other Ambulatory Visit: Payer: Self-pay | Admitting: Family Medicine

## 2020-03-09 ENCOUNTER — Other Ambulatory Visit: Payer: Self-pay

## 2020-03-09 ENCOUNTER — Telehealth: Payer: PPO

## 2020-03-18 ENCOUNTER — Other Ambulatory Visit: Payer: Self-pay | Admitting: Family Medicine

## 2020-03-20 ENCOUNTER — Encounter: Payer: Self-pay | Admitting: Student

## 2020-03-20 ENCOUNTER — Other Ambulatory Visit: Payer: Self-pay

## 2020-03-20 ENCOUNTER — Ambulatory Visit (INDEPENDENT_AMBULATORY_CARE_PROVIDER_SITE_OTHER): Payer: PPO | Admitting: Student

## 2020-03-20 ENCOUNTER — Telehealth: Payer: Self-pay | Admitting: Licensed Clinical Social Worker

## 2020-03-20 VITALS — BP 122/76 | HR 72 | Ht 65.0 in | Wt 177.0 lb

## 2020-03-20 DIAGNOSIS — I5042 Chronic combined systolic (congestive) and diastolic (congestive) heart failure: Secondary | ICD-10-CM | POA: Diagnosis not present

## 2020-03-20 DIAGNOSIS — Z95810 Presence of automatic (implantable) cardiac defibrillator: Secondary | ICD-10-CM

## 2020-03-20 DIAGNOSIS — E782 Mixed hyperlipidemia: Secondary | ICD-10-CM | POA: Diagnosis not present

## 2020-03-20 DIAGNOSIS — N1832 Chronic kidney disease, stage 3b: Secondary | ICD-10-CM | POA: Diagnosis not present

## 2020-03-20 DIAGNOSIS — Z952 Presence of prosthetic heart valve: Secondary | ICD-10-CM | POA: Diagnosis not present

## 2020-03-20 NOTE — Patient Instructions (Signed)
Medication Instructions:  Your physician has recommended you make the following change in your medication:   Stop Taking Niacin   *If you need a refill on your cardiac medications before your next appointment, please call your pharmacy*   Lab Work: NONE   If you have labs (blood work) drawn today and your tests are completely normal, you will receive your results only by: Marland Kitchen MyChart Message (if you have MyChart) OR . A paper copy in the mail If you have any lab test that is abnormal or we need to change your treatment, we will call you to review the results.   Testing/Procedures: NONE    Follow-Up: At [facility], you and your health needs are our priority.  As part of our continuing mission to provide you with exceptional heart care, we have created designated Provider Care Teams.  These Care Teams include your primary Cardiologist (physician) and Advanced Practice Providers (APPs -  Physician Assistants and Nurse Practitioners) who all work together to provide you with the care you need, when you need it.  We recommend signing up for the patient portal called "MyChart".  Sign up information is provided on this After Visit Summary.  MyChart is used to connect with patients for Virtual Visits (Telemedicine).  Patients are able to view lab/test results, encounter notes, upcoming appointments, etc.  Non-urgent messages can be sent to your provider as well.   To learn more about what you can do with MyChart, go to [email or web address].    Your next appointment:   5 month(s)  The format for your next appointment:   In Person  Provider:   Rozann Lesches, MD   Other Instructions Thank you for choosing [facility]!

## 2020-03-20 NOTE — Telephone Encounter (Signed)
CSW referred to assist patient with obtaining a BP cuff. CSW contacted patient to inform cuff will be delivered to home. Patient grateful for support and assistance. CSW available as needed. Jackie Ommie Degeorge, LCSW, CCSW-MCS [phone]  

## 2020-03-20 NOTE — Progress Notes (Signed)
Cardiology Office Note    Date:  03/20/2020   ID:  [name], [name] 1946/06/02, MRN [id number]  PCP:  Fayrene Helper, MD  Cardiologist: Rozann Lesches, MD   EP: [name]  Chief Complaint  Patient presents with  . Follow-up    3 month visit    History of Present Illness:    [name] [name] is a 74 y.o. female with past medical history of chronic combined systolic and diastolic CHF/NICM (EF 43-60% by echo in 2019, cath in 2015 showing no significant CAD, s/p CRT-D placement in 09/2019), severe AS (s/p TAVR in 03/2015), HTN, HLD, Type 2 DM, Stage 3-4 CKD and known LBBB who presents to the office today for 12-monthfollow-up.   She was last examined by Dr. MDomenic Politein 12/2019 and had recently undergone Medtronic CRT-D placement by Dr. TLovena Lein 09/2019. She reported chronic dyspnea on exertion but denied any chest pain or palpitations. She was continued on ASA 814mdaily, Coreg 2576mID, Lasix 97m43mily, and Spironolactone 25mg64mly.  A follow-up echo was recommended with the consideration of adding BiDil or Hydralazine pending EF. She had not been on an ACE-I/ARB/ARNI due to her CKD. Her echo showed her EF had slightly improved to 20-25% and her aortic valve was functioning normally.   Was evaluated by Dr. TayloLovena Le6/2021 and was doing well at that time. Her device was interrogated and functioning normally. She did have episodes of NSVT with the longest being 16 beats.    In talking with the patient and her son today, she reports overall doing well from a cardiac perspective since her last visit. She performs all of her ADL's independently and cooks and cleans on a regular basis. She denies any recent chest pain or dyspnea on exertion. Says her limiting factor is her bilateral knee pain and she undergoes routine steroid injections by Dr. HarriAline Brochure denies any specific orthopnea, PND or lower extremity edema. No recent palpitations.  She does not have a blood pressure cuff at  home but is concerned about her readings as she reports this was low in the past.   Past Medical History:  Diagnosis Date  . Anxiety   . Aortic stenosis   . Arthritis   . Biventricular ICD (implantable cardioverter-defibrillator) in place    March 2021, Medtronic  -Dr. TayloLovena Lehronic combined systolic and diastolic CHF (congestive heart failure) (HCC) [place] CKD (chronic kidney disease) stage 3, GFR 30-59 ml/min   . Constipation   . Depression   . Diabetes mellitus, type 2 (HCC) [place] Essential hypertension   . Hyperlipidemia   . Incidental pulmonary nodule, > 3mm a51m< 8mm   34mx 4 mm LLL nodule  . LBBB (left bundle branch block)   . Nonischemic cardiomyopathy (HCC)    No significant obstructive CAD at heart catheterization 05/2014, LVEF 20%  . Obesity   . Pneumonia 2013  . 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 Symptomatic PVCs     Past Surgical History:  Procedure Laterality Date  . BIV ICD INSERTION CRT-D N/A 10/28/2019   Procedure: BIV ICD INSERTION CRT-D;  Surgeon: Taylor[facility]Evans [facility];  Service: Cardiovascular;  Laterality: N/A;  . CARDIAC CATHETERIZATION    . CESAREAN SECTION  1987  . FLEXIBLE BRONCHOSCOPY N/A 01/14/2015  Procedure: FLEXIBLE BRONCHOSCOPY;  Surgeon: [name], MD;  Location: [place];  Service: Thoracic;  Laterality: N/A;  . LEFT AND RIGHT HEART CATHETERIZATION WITH CORONARY ANGIOGRAM N/A 12/05/2011   Procedure: LEFT AND RIGHT HEART CATHETERIZATION WITH CORONARY ANGIOGRAM;  Surgeon: [name], MD;  Location: [facility] CATH LAB;  Service: Cardiovascular;  Laterality: N/A;  . LEFT AND RIGHT HEART CATHETERIZATION WITH CORONARY ANGIOGRAM N/A 05/23/2014   Procedure: LEFT AND RIGHT HEART CATHETERIZATION WITH CORONARY ANGIOGRAM;  Surgeon: [name], MD;  Location: [facility] CATH LAB;  Service:  Cardiovascular;  Laterality: N/A;  . MULTIPLE EXTRACTIONS WITH ALVEOLOPLASTY N/A 02/27/2014   Procedure: Extraction of tooth #'s 2,4,5,6,7,8,9,10,11,12, 22, 23, 24, 25, 26, 28 with alveoloplasty and bilateral mandibular tori reductions;  Surgeon: [name], DDS;  Location: [place];  Service: Oral Surgery;  Laterality: N/A;  . RIGID BRONCHOSCOPY N/A 01/14/2015   Procedure: RIGID BRONCHOSCOPY with Removal Of Foreign Body ;  Surgeon: [name], MD;  Location: [place];  Service: Thoracic;  Laterality: N/A;  . TEE WITHOUT CARDIOVERSION N/A 03/24/2015   Procedure: TRANSESOPHAGEAL ECHOCARDIOGRAM (TEE);  Surgeon: [name], MD;  Location: [place];  Service: Open Heart Surgery;  Laterality: N/A;  . TRANSCATHETER AORTIC VALVE REPLACEMENT, TRANSFEMORAL Bilateral 03/24/2015   Procedure: TRANSCATHETER AORTIC VALVE REPLACEMENT, TRANSFEMORAL;  Surgeon: [name], MD;  Location: [place];  Service: Open Heart Surgery;  Laterality: Bilateral;  . TUBAL LIGATION  1987  . VIDEO BRONCHOSCOPY Bilateral 06/05/2014   Procedure: VIDEO BRONCHOSCOPY WITHOUT FLUORO;  Surgeon: [name], MD;  Location[facility] ENDOSCOPY;  Service: Cardiopulmonary;  Laterality: Bilateral;    Current Medications: Outpatient Medications Prior to Visit  Medication Sig Dispense Refill  . acetaminophen (TYLENOL) 500 MG tablet Take one tablet two times daily, by mouth, for knee  pain (Patient taking differently: Take 500 mg by mouth 2 (two) times daily as needed for moderate pain. Take one tablet two times daily, by mouth, for knee  pain) 60 tablet 3  . aspirin EC 81 MG tablet Take 81 mg by mouth every morning.     . blood glucose meter kit and supplies Dispense based on patient and insurance preference. Use to test three times daily (FOR ICD-10 E10.9, E11.9). 1 each 0  . carvedilol (COREG) 25 MG tablet TAKE 1 TABLET BY MOUTH TWICE DAILY WITH MEALS. (Patient taking differently: Take 25 mg by mouth 2 (two) times daily  with a meal. ) 60 tablet 11  . colchicine 0.6 MG tablet Take 1 tablet (0.6 mg total) by mouth 2 (two) times daily. Take with food 60 tablet 5  . diclofenac Sodium (VOLTAREN) 1 % GEL Apply 1 application topically 4 (four) times daily as needed (pain).    Marland Kitchen ezetimibe (ZETIA) 10 MG tablet TAKE ONE TABLET BY MOUTH ONCE DAILY. (Patient taking differently: Take 10 mg by mouth daily. ) 90 tablet 0  . fenofibrate (TRICOR) 145 MG tablet TAKE (1) TABLET BY MOUTH ONCE DAILY. 30 tablet 0  . furosemide (LASIX) 40 MG tablet TAKE (1) TABLET BY MOUTH EACH MORNING. 30 tablet 0  . gabapentin (NEURONTIN) 100 MG capsule Take 1 capsule (100 mg total) by mouth 3 (three) times daily. (Patient taking differently: Take 100 mg by mouth 3 (three) times daily as needed. ) 90 capsule 2  . glipiZIDE (GLUCOTROL XL) 2.5 MG 24 hr tablet Take 1 tablet (2.5 mg total) by mouth daily with breakfast. 30 tablet 5  . Insulin Pen Needle [facility]  PEN NEEDLES) 31G X 8 MM MISC USE AS NEEDED TO INJECT INSULIN. 100 each 5  . Lancets (ONETOUCH DELICA PLUS [id number]) MISC USE 3 TIMES A DAY AS DIRECTED. 100 each 5  . LIVALO 2 MG TABS TAKE ONE TABLET BY MOUTH ONCE DAILY AFTER SUPPER. 90 tablet 0  . Multiple Vitamins-Minerals (MULTIVITAMIN PO) Take 1 tablet by mouth every morning.     Marland Kitchen NOVOLOG MIX 70/30 FLEXPEN (70-30) 100 UNIT/ML FlexPen INJECT 12 TO 15 UNITS SUBCUTANEOUSLY TWICE DAILY. 15 mL 0  . ONETOUCH ULTRA test strip CHECK BLOOD SUGAR 3 TIMES A DAY. 100 strip 5  . potassium chloride (KLOR-CON) 10 MEQ tablet TAKE ONE TABLET BY MOUTH DAILY. DO NOT TAKE IF NOT TAKING FUROSEMIDE. 30 tablet 0  . spironolactone (ALDACTONE) 25 MG tablet TAKE ONE TABLET BY MOUTH ONCE DAILY. 90 tablet 0  . UNABLE TO FIND Shower bench x 1   DX M17.12 1 each 0  . UNABLE TO FIND Rolling walker with seat x 1  DX m17.12 1 each 0  . niacin (NIASPAN) 500 MG CR tablet TAKE (1) TABLET BY MOUTH AT BEDTIME. (Patient taking differently: Take 500 mg by mouth at bedtime. )  90 tablet 0   No facility-administered medications prior to visit.     Allergies:   Crestor [rosuvastatin], Tramadol, and Penicillins   Social History   Socioeconomic History  . Marital status: Widowed    Spouse name: Not on file  . Number of children: 5  . Years of education: Not on file  . Highest education level: Not on file  Occupational History  . Occupation: Retired  Tobacco Use  . Smoking status: Never Smoker  . Smokeless tobacco: Never Used  Vaping Use  . Vaping Use: Never used  Substance and Sexual Activity  . Alcohol use: No  . Drug use: No  . Sexual activity: Never    Birth control/protection: None, Post-menopausal  Other Topics Concern  . Not on file  Social History Narrative  . Not on file   Social Determinants of Health   Financial Resource Strain:   . Difficulty of Paying Living Expenses:   Food Insecurity:   . Worried About Charity fundraiser in the Last Year:   . Arboriculturist in the Last Year:   Transportation Needs:   . Film/video editor (Medical):   Marland Kitchen Lack of Transportation (Non-Medical):   Physical Activity:   . Days of Exercise per Week:   . Minutes of Exercise per Session:   Stress:   . Feeling of Stress :   Social Connections:   . Frequency of Communication with Friends and Family:   . Frequency of Social Gatherings with Friends and Family:   . Attends Religious Services:   . Active Member of Clubs or Organizations:   . Attends Archivist Meetings:   Marland Kitchen Marital Status:      Family History:  The patient's family history includes Breast cancer in her sister; Cancer in her brother; Colon cancer (age of onset: 54) in her father; Congestive Heart Failure in her daughter and son; Diabetes in her brother and mother; Heart attack (age of onset: 7) in her mother; Heart disease in her mother; High Cholesterol in her son; Hypertension in her brother, mother, and son; Stroke in her brother.   Review of Systems:   Please see the  history of present illness.     General:  No chills, fever, night sweats or weight changes.  Positive for knee pain.  Cardiovascular:  No chest pain, dyspnea on exertion, edema, orthopnea, palpitations, paroxysmal nocturnal dyspnea. Dermatological: No rash, lesions/masses Respiratory: No cough, dyspnea Urologic: No hematuria, dysuria Abdominal:   No nausea, vomiting, diarrhea, bright red blood per rectum, melena, or hematemesis Neurologic:  No visual changes, wkns, changes in mental status. All other systems reviewed and are otherwise negative except as noted above.   Physical Exam:    VS:  BP 122/76   Pulse 72   Ht 5' 5" (1.651 m)   Wt 177 lb (80.3 kg)   SpO2 98%   BMI 29.45 kg/m    General: Well developed, well nourished,female appearing in no acute distress. Head: Normocephalic, atraumatic. Neck: No carotid bruits. JVD not elevated.  Lungs: Respirations regular and unlabored, without wheezes or rales.  Heart: Regular rate and rhythm. No S3 or S4.  2/6 SEM along RUSB.  Abdomen: Appears non-distended. No obvious abdominal masses. Msk:  Strength and tone appear normal for age. No obvious joint deformities or effusions. Extremities: No clubbing or cyanosis. No lower extremity edema.  Distal pedal pulses are 2+ bilaterally. Neuro: Alert and oriented X 3. Moves all extremities spontaneously. No focal deficits noted. Psych:  Responds to questions appropriately with a normal affect. Skin: No rashes or lesions noted  Wt Readings from Last 3 Encounters:  03/20/20 177 lb (80.3 kg)  02/12/20 177 lb (80.3 kg)  01/28/20 179 lb 0.6 oz (81.2 kg)     Studies/Labs Reviewed:   EKG:  EKG is not ordered today.    Recent Labs: 09/10/2019: ALT 17; B Natriuretic Peptide 577.0 12/05/2019: BUN 42; Creatinine, Ser 1.79; Hemoglobin 11.5; Magnesium 2.1; Platelets 145; Potassium 3.9; Sodium 134   Lipid Panel    Component Value Date/Time   CHOL 168 08/20/2019 1001   TRIG 72 08/20/2019 1001   HDL  58 08/20/2019 1001   CHOLHDL 2.9 08/20/2019 1001   VLDL 21 02/06/2017 0916   LDLCALC 94 08/20/2019 1001    Additional studies/ records that were reviewed today include:   Echocardiogram: 12/2019 IMPRESSIONS    1. Left ventricular ejection fraction, by estimation, is 20 to 25%. The  left ventricle has severely decreased function. The left ventricle  demonstrates global hypokinesis. There is moderate left ventricular  hypertrophy. Left ventricular diastolic  parameters are consistent with Grade I diastolic dysfunction (impaired  relaxation).  2. Right ventricular systolic function is normal. The right ventricular  size is normal. There is normal pulmonary artery systolic pressure.  3. The mitral valve is normal in structure. Mild mitral valve  regurgitation. No evidence of mitral stenosis.  4. 23 mm Edwards Sapien 3 transcatheter heart valve is in the AV  position.   . The aortic valve has been repaired/replaced. Aortic valve  regurgitation is not visualized. No aortic stenosis is present.  5. The inferior vena cava is normal in size with greater than 50%  respiratory variability, suggesting right atrial pressure of 3 mmHg.   Assessment:    1. Chronic combined systolic and diastolic CHF (congestive heart failure) ([place])   2. ICD (implantable cardioverter-defibrillator) in place   3. S/P TAVR (transcatheter aortic valve replacement)   4. Mixed hyperlipidemia   5. Stage 3b chronic kidney disease      Plan:   In order of problems listed above:  1. Chronic Combined Systolic and Diastolic CHF - EF was previously 15-20% by echo in 2019 with cath in 2015 showing no significant CAD. Most recent echocardiogram showed her  EF had slightly improved to 20 to 25%. She denies any recent orthopnea, PND or lower extremity edema. Appears euvolemic on examination today. She does report intermittent episodes of hypotension in the past but does not have a blood pressure cuff at home to  check this. BP is at 122/76 during today's visit. I will reach out to social work to see if they can send the patient a cuff. If able to check this at home, she was encouraged to keep a log and report back with readings. Continue Coreg 25 mg twice daily, Lasix 40 mg daily and Spironolactone 25 mg daily.  If BP allows, would add Hydralazine 12.5 mg twice daily. Not on ACE-I/ARB/ARNI due to CKD.  2. NICM/LBBB - She is s/p CRT-D placement in 09/2019 which is followed by [name]. Most recent device interrogation showed normal device function with episodes of NSVT up to 16 beats.  3. Aortic Stenosis - She is s/p TAVR in 03/2015. Most recent echocardiogram showed her valve was functioning normally with no evidence of stenosis or regurgitation.  4. HLD - She is on multiple medications for hyperlipidemia including Fenofibrate, Zetia, Livalo and Niacin.  Niacin is no longer recommended, therefore will have her discontinue this.   5. Stage 3-4 CKD - Baseline creatinine of 1.7  - 1.8. Stable at 1.79 by labs in 11/2019.     Medication Adjustments/Labs and Tests Ordered: Current medicines are reviewed at length with the patient today.  Concerns regarding medicines are outlined above.  Medication changes, Labs and Tests ordered today are listed in the Patient Instructions below. Patient Instructions  Medication Instructions:  Your physician has recommended you make the following change in your medication:   Stop Taking Niacin   *If you need a refill on your cardiac medications before your next appointment, please call your pharmacy*   Lab Work: NONE   If you have labs (blood work) drawn today and your tests are completely normal, you will receive your results only by: Marland Kitchen MyChart Message (if you have MyChart) OR . A paper copy in the mail If you have any lab test that is abnormal or we need to change your treatment, we will call you to review the results.   Testing/Procedures: NONE     Follow-Up: At [facility], you and your health needs are our priority.  As part of our continuing mission to provide you with exceptional heart care, we have created designated Provider Care Teams.  These Care Teams include your primary Cardiologist (physician) and Advanced Practice Providers (APPs -  Physician Assistants and Nurse Practitioners) who all work together to provide you with the care you need, when you need it.  We recommend signing up for the patient portal called "MyChart".  Sign up information is provided on this After Visit Summary.  MyChart is used to connect with patients for Virtual Visits (Telemedicine).  Patients are able to view lab/test results, encounter notes, upcoming appointments, etc.  Non-urgent messages can be sent to your provider as well.   To learn more about what you can do with MyChart, go to [email or web address].    Your next appointment:   5 month(s)  The format for your next appointment:   In Person  Provider:   Rozann Lesches, MD   Other Instructions Thank you for choosing [facility]!       Signed, Erma Heritage, PA-C  03/20/2020 5:12 PM    [address] [place],  [place] [address] Phone: [phone] Fax: [phone]

## 2020-04-08 ENCOUNTER — Other Ambulatory Visit: Payer: Self-pay | Admitting: Family Medicine

## 2020-04-16 ENCOUNTER — Other Ambulatory Visit: Payer: Self-pay | Admitting: Family Medicine

## 2020-04-21 ENCOUNTER — Other Ambulatory Visit: Payer: Self-pay | Admitting: Family Medicine

## 2020-04-27 ENCOUNTER — Ambulatory Visit (INDEPENDENT_AMBULATORY_CARE_PROVIDER_SITE_OTHER): Payer: PPO | Admitting: *Deleted

## 2020-04-27 DIAGNOSIS — I428 Other cardiomyopathies: Secondary | ICD-10-CM | POA: Diagnosis not present

## 2020-04-28 LAB — CUP PACEART REMOTE DEVICE CHECK
Battery Remaining Longevity: 107 mo
Battery Voltage: 3 V
Brady Statistic AP VP Percent: 26.85 %
Brady Statistic AP VS Percent: 0.77 %
Brady Statistic AS VP Percent: 70.79 %
Brady Statistic AS VS Percent: 1.59 %
Brady Statistic RA Percent Paced: 26.79 %
Brady Statistic RV Percent Paced: 0.17 %
Date Time Interrogation Session: 20210913023323
HighPow Impedance: 80 Ohm
Implantable Lead Implant Date: 20210315
Implantable Lead Implant Date: 20210315
Implantable Lead Implant Date: 20210315
Implantable Lead Location: 753858
Implantable Lead Location: 753859
Implantable Lead Location: 753860
Implantable Lead Model: 4598
Implantable Lead Model: 5076
Implantable Lead Model: 6935
Implantable Pulse Generator Implant Date: 20210315
Lead Channel Impedance Value: 230.327
Lead Channel Impedance Value: 230.327
Lead Channel Impedance Value: 244.491
Lead Channel Impedance Value: 274.19 Ohm
Lead Channel Impedance Value: 274.19 Ohm
Lead Channel Impedance Value: 361 Ohm
Lead Channel Impedance Value: 418 Ohm
Lead Channel Impedance Value: 418 Ohm
Lead Channel Impedance Value: 475 Ohm
Lead Channel Impedance Value: 513 Ohm
Lead Channel Impedance Value: 513 Ohm
Lead Channel Impedance Value: 589 Ohm
Lead Channel Impedance Value: 608 Ohm
Lead Channel Impedance Value: 779 Ohm
Lead Channel Impedance Value: 817 Ohm
Lead Channel Impedance Value: 931 Ohm
Lead Channel Impedance Value: 950 Ohm
Lead Channel Impedance Value: 950 Ohm
Lead Channel Pacing Threshold Amplitude: 0.5 V
Lead Channel Pacing Threshold Amplitude: 0.625 V
Lead Channel Pacing Threshold Amplitude: 0.75 V
Lead Channel Pacing Threshold Pulse Width: 0.4 ms
Lead Channel Pacing Threshold Pulse Width: 0.4 ms
Lead Channel Pacing Threshold Pulse Width: 0.4 ms
Lead Channel Sensing Intrinsic Amplitude: 2.25 mV
Lead Channel Sensing Intrinsic Amplitude: 2.25 mV
Lead Channel Sensing Intrinsic Amplitude: 22.625 mV
Lead Channel Sensing Intrinsic Amplitude: 22.625 mV
Lead Channel Setting Pacing Amplitude: 1.5 V
Lead Channel Setting Pacing Amplitude: 2 V
Lead Channel Setting Pacing Amplitude: 2.25 V
Lead Channel Setting Pacing Pulse Width: 0.4 ms
Lead Channel Setting Pacing Pulse Width: 0.4 ms
Lead Channel Setting Sensing Sensitivity: 0.3 mV

## 2020-04-29 NOTE — Progress Notes (Signed)
Remote ICD transmission.   

## 2020-05-01 ENCOUNTER — Other Ambulatory Visit: Payer: Self-pay | Admitting: Cardiology

## 2020-05-01 ENCOUNTER — Other Ambulatory Visit: Payer: Self-pay | Admitting: Family Medicine

## 2020-05-04 ENCOUNTER — Ambulatory Visit (HOSPITAL_COMMUNITY): Payer: PPO

## 2020-05-07 DIAGNOSIS — I10 Essential (primary) hypertension: Secondary | ICD-10-CM | POA: Diagnosis not present

## 2020-05-07 DIAGNOSIS — N184 Chronic kidney disease, stage 4 (severe): Secondary | ICD-10-CM | POA: Diagnosis not present

## 2020-05-07 DIAGNOSIS — I129 Hypertensive chronic kidney disease with stage 1 through stage 4 chronic kidney disease, or unspecified chronic kidney disease: Secondary | ICD-10-CM | POA: Diagnosis not present

## 2020-05-07 DIAGNOSIS — E1121 Type 2 diabetes mellitus with diabetic nephropathy: Secondary | ICD-10-CM | POA: Diagnosis not present

## 2020-05-07 DIAGNOSIS — E1122 Type 2 diabetes mellitus with diabetic chronic kidney disease: Secondary | ICD-10-CM | POA: Diagnosis not present

## 2020-05-08 LAB — HEMOGLOBIN A1C
Hgb A1c MFr Bld: 7.4 % of total Hgb — ABNORMAL HIGH (ref <5.7)
Mean Plasma Glucose: 166 (calc)
eAG (mmol/L): 9.2 (calc)

## 2020-05-08 LAB — COMPLETE METABOLIC PANEL WITH GFR
AG Ratio: 1.6 (calc) (ref 1.0–2.5)
ALT: 18 U/L (ref 6–29)
AST: 27 U/L (ref 10–35)
Albumin: 4.2 g/dL (ref 3.6–5.1)
Alkaline phosphatase (APISO): 45 U/L (ref 37–153)
BUN/Creatinine Ratio: 17 (calc) (ref 6–22)
BUN: 37 mg/dL — ABNORMAL HIGH (ref 7–25)
CO2: 28 mmol/L (ref 20–32)
Calcium: 10.1 mg/dL (ref 8.6–10.4)
Chloride: 105 mmol/L (ref 98–110)
Creat: 2.13 mg/dL — ABNORMAL HIGH (ref 0.60–0.93)
GFR, Est African American: 26 mL/min/{1.73_m2} — ABNORMAL LOW (ref >=60)
GFR, Est Non African American: 22 mL/min/{1.73_m2} — ABNORMAL LOW (ref >=60)
Globulin: 2.6 g/dL (calc) (ref 1.9–3.7)
Glucose, Bld: 162 mg/dL — ABNORMAL HIGH (ref 65–99)
Potassium: 4.6 mmol/L (ref 3.5–5.3)
Sodium: 141 mmol/L (ref 135–146)
Total Bilirubin: 0.5 mg/dL (ref 0.2–1.2)
Total Protein: 6.8 g/dL (ref 6.1–8.1)

## 2020-05-11 ENCOUNTER — Ambulatory Visit (INDEPENDENT_AMBULATORY_CARE_PROVIDER_SITE_OTHER): Payer: PPO | Admitting: Family Medicine

## 2020-05-11 ENCOUNTER — Encounter: Payer: Self-pay | Admitting: Family Medicine

## 2020-05-11 ENCOUNTER — Ambulatory Visit (HOSPITAL_COMMUNITY): Payer: PPO

## 2020-05-11 ENCOUNTER — Other Ambulatory Visit: Payer: Self-pay

## 2020-05-11 VITALS — BP 118/64 | HR 72 | Resp 16 | Ht 66.0 in | Wt 175.1 lb

## 2020-05-11 DIAGNOSIS — M159 Polyosteoarthritis, unspecified: Secondary | ICD-10-CM | POA: Diagnosis not present

## 2020-05-11 DIAGNOSIS — E663 Overweight: Secondary | ICD-10-CM

## 2020-05-11 DIAGNOSIS — I10 Essential (primary) hypertension: Secondary | ICD-10-CM | POA: Diagnosis not present

## 2020-05-11 DIAGNOSIS — E785 Hyperlipidemia, unspecified: Secondary | ICD-10-CM | POA: Diagnosis not present

## 2020-05-11 DIAGNOSIS — Z1231 Encounter for screening mammogram for malignant neoplasm of breast: Secondary | ICD-10-CM | POA: Diagnosis not present

## 2020-05-11 DIAGNOSIS — E1121 Type 2 diabetes mellitus with diabetic nephropathy: Secondary | ICD-10-CM | POA: Diagnosis not present

## 2020-05-11 DIAGNOSIS — I120 Hypertensive chronic kidney disease with stage 5 chronic kidney disease or end stage renal disease: Secondary | ICD-10-CM

## 2020-05-11 DIAGNOSIS — Z23 Encounter for immunization: Secondary | ICD-10-CM | POA: Diagnosis not present

## 2020-05-11 DIAGNOSIS — N185 Chronic kidney disease, stage 5: Secondary | ICD-10-CM

## 2020-05-11 MED ORDER — PREDNISONE 10 MG PO TABS: 10.0000 mg | ORAL_TABLET | Freq: Two times a day (BID) | ORAL | 0 refills | Status: DC

## 2020-05-11 NOTE — Patient Instructions (Addendum)
Annual physical exam in office with MD end   January when due  Flu vaccine today  You are referred to kidney Specialist, his office will call  Fasting lipid, cmp and eGFr, hBA1C and TSH and microalb Jan15  , or after  Please reschedule mammogram for early January  Please get help with sending off  stool sample  5 day course of prednisone is prescribed for arthritis pain  Please continue to think about gettng your covid vaccines, you DO nEED them they will hw elp to protect you from bad infection  No fallls.  Blood sugar very good  Think about what you will eat, plan ahead. Choose " clean, green, fresh or frozen" over canned, processed or packaged foods which are more sugary, salty and fatty. 70 to 75% of food eaten should be vegetables and fruit. Three meals at set times with snacks allowed between meals, but they must be fruit or vegetables. Aim to eat over a 12 hour period , example 7 am to 7 pm, and STOP after  your last meal of the day. Drink water,generally about 64 ounces per day, no other drink is as healthy. Fruit juice is best enjoyed in a healthy way, by EATING the fruit.  Thanks for choosing [facility], we consider it a privelige to serve you.

## 2020-05-13 ENCOUNTER — Other Ambulatory Visit: Payer: Self-pay | Admitting: Family Medicine

## 2020-05-17 ENCOUNTER — Encounter: Payer: Self-pay | Admitting: Family Medicine

## 2020-05-17 NOTE — Assessment & Plan Note (Signed)
Hyperlipidemia:Low fat diet discussed and encouraged.   Lipid Panel  Lab Results  Component Value Date   CHOL 168 08/20/2019   HDL 58 08/20/2019   LDLCALC 94 08/20/2019   TRIG 72 08/20/2019   CHOLHDL 2.9 08/20/2019     Updated lab needed at/ before next visit.

## 2020-05-17 NOTE — Assessment & Plan Note (Signed)
Controlled, no change in medication [name] [name] is reminded of the importance of commitment to daily physical activity for 30 minutes or more, as able and the need to limit carbohydrate intake to 30 to 60 grams per meal to help with blood sugar control.   The need to take medication as prescribed, test blood sugar as directed, and to call between visits if there is a concern that blood sugar is uncontrolled is also discussed.   [name] [name] is reminded of the importance of daily foot exam, annual eye examination, and good blood sugar, blood pressure and cholesterol control.  Diabetic Labs Latest Ref Rng & Units 05/07/2020 01/28/2020 12/05/2019 10/25/2019 09/10/2019  HbA1c <5.7 % of total Hgb 7.4(H) 7.3(A) - - -  Microalbumin mg/dL - - - - -  Micro/Creat Ratio <30 mcg/mg creat - - - - -  Chol <200 mg/dL - - - - -  HDL > OR = 50 mg/dL - - - - -  Calc LDL mg/dL (calc) - - - - -  Triglycerides <150 mg/dL - - - - -  Creatinine 0.60 - 0.93 mg/dL 2.13(H) - 1.79(H) 1.78(H) 1.61(H)   BP/Weight 05/11/2020 03/20/2020 02/12/2020 01/28/2020 12/19/2019 12/16/2019 09/16/5425  Systolic BP 062 376 283 151 761 96 607  Diastolic BP 64 76 88 80 74 64 65  Wt. (Lbs) 175.12 177 177 179.04 171 170 167.55  BMI 28.27 29.45 28.57 28.9 27.6 27.44 27.04   Foot/eye exam completion dates Latest Ref Rng & Units 08/22/2019 07/31/2017  Eye Exam No Retinopathy - -  Foot exam Order - - -  Foot Form Completion - Done Done

## 2020-05-17 NOTE — Assessment & Plan Note (Signed)
Controlled, no change in medication DASH diet and commitment to daily physical activity for a minimum of 30 minutes discussed and encouraged, as a part of hypertension management. The importance of attaining a healthy weight is also discussed.  BP/Weight 05/11/2020 03/20/2020 02/12/2020 01/28/2020 12/19/2019 12/16/2019 8/89/1694  Systolic BP 503 888 280 034 917 96 915  Diastolic BP 64 76 88 80 74 64 65  Wt. (Lbs) 175.12 177 177 179.04 171 170 167.55  BMI 28.27 29.45 28.57 28.9 27.6 27.44 27.04

## 2020-05-17 NOTE — Progress Notes (Signed)
[name] [name]     MRN: [id number]      DOB: [birth year]   HPI [name] [name] is here for follow up and re-evaluation of chronic medical conditions, medication management and review of any available recent lab and radiology data.  Preventive health is updated, specifically  Cancer screening and Immunization.Holding off on mammogram ,till January due to defibrillator, and needs help with cologuard, denies change in stool, black stool or bloody stool   Questions or concerns regarding consultations or procedures which the PT has had in the interim are  addressed. The PT denies any adverse reactions to current medications since the last visit.  C/o generalized , disabling arthritis pain Denies polyuria, polydipsia, blurred vision , or hypoglycemic episodes.   ROS Denies recent fever or chills. Denies sinus pressure, nasal congestion, ear pain or sore throat. Denies chest congestion, productive cough or wheezing. Denies chest pains, palpitations and leg swelling Denies abdominal pain, nausea, vomiting,diarrhea or constipation.   Denies dysuria, frequency, hesitancy or incontinence. C/o increased and uncontrolled  joint pain, swelling and limitation in mobility. Denies headaches, seizures, numbness, or tingling. Denies depression, anxiety or insomnia. Denies skin break down or rash.   PE  BP 118/64   Pulse 72   Resp 16   Ht 5\' 6"  (1.676 m)   Wt 175 lb 1.9 oz (79.4 kg)   SpO2 97%   BMI 28.27 kg/m   Patient alert and oriented and in no cardiopulmonary distress.  HEENT: No facial asymmetry, EOMI,     Neck supple .  Chest: Clear to auscultation bilaterally.  CVS: S1, S2 systolic  murmurs, no S3.Regular rate.  ABD: Soft non tender.   Ext: No edema  MS: Decreased ROM spine, shoulders, hips and knees.  Skin: Intact, no ulcerations or rash noted.  Psych: Good eye contact, normal affect. Memory intact not anxious or depressed appearing.  CNS: CN 2-12 intact, power,  normal  throughout.no focal deficits noted.   Assessment & Plan  Type 2 diabetes mellitus with nephropathy (HCC) Controlled, no change in medication [name] [name] is reminded of the importance of commitment to daily physical activity for 30 minutes or more, as able and the need to limit carbohydrate intake to 30 to 60 grams per meal to help with blood sugar control.   The need to take medication as prescribed, test blood sugar as directed, and to call between visits if there is a concern that blood sugar is uncontrolled is also discussed.   [name] [name] is reminded of the importance of daily foot exam, annual eye examination, and good blood sugar, blood pressure and cholesterol control.  Diabetic Labs Latest Ref Rng & Units 05/07/2020 01/28/2020 12/05/2019 10/25/2019 09/10/2019  HbA1c <5.7 % of total Hgb 7.4(H) 7.3(A) - - -  Microalbumin mg/dL - - - - -  Micro/Creat Ratio <30 mcg/mg creat - - - - -  Chol <200 mg/dL - - - - -  HDL > OR = 50 mg/dL - - - - -  Calc LDL mg/dL (calc) - - - - -  Triglycerides <150 mg/dL - - - - -  Creatinine 0.60 - 0.93 mg/dL 2.13(H) - 1.79(H) 1.78(H) 1.61(H)   BP/Weight 05/11/2020 03/20/2020 02/12/2020 01/28/2020 12/19/2019 12/16/2019 01/29/736  Systolic BP 106 269 485 462 703 96 500  Diastolic BP 64 76 88 80 74 64 65  Wt. (Lbs) 175.12 177 177 179.04 171 170 167.55  BMI 28.27 29.45 28.57 28.9 27.6 27.44 27.04   Foot/eye exam completion  dates Latest Ref Rng & Units 08/22/2019 07/31/2017  Eye Exam No Retinopathy - -  Foot exam Order - - -  Foot Form Completion - Done Done        Overweight (BMI 25.0-29.9)  Patient re-educated about  the importance of commitment to a  minimum of 150 minutes of exercise per week as able.  The importance of healthy food choices with portion control discussed, as well as eating regularly and within a 12 hour window most days. The need to choose "clean , green" food 50 to 75% of the time is discussed, as well as to make water the primary drink  and set a goal of 64 ounces water daily.    Weight /BMI 05/11/2020 03/20/2020 02/12/2020  WEIGHT 175 lb 1.9 oz 177 lb 177 lb  HEIGHT 5\' 6"  5\' 5"  5\' 6"   BMI 28.27 kg/m2 29.45 kg/m2 28.57 kg/m2      Essential hypertension, benign Controlled, no change in medication DASH diet and commitment to daily physical activity for a minimum of 30 minutes discussed and encouraged, as a part of hypertension management. The importance of attaining a healthy weight is also discussed.  BP/Weight 05/11/2020 03/20/2020 02/12/2020 01/28/2020 12/19/2019 12/16/2019 02/22/6268  Systolic BP 485 462 703 500 938 96 182  Diastolic BP 64 76 88 80 74 64 65  Wt. (Lbs) 175.12 177 177 179.04 171 170 167.55  BMI 28.27 29.45 28.57 28.9 27.6 27.44 27.04       Generalized osteoarthritis Uncontrolled flare of pain, short course of oral prednisone  Hyperlipidemia LDL goal <100 Hyperlipidemia:Low fat diet discussed and encouraged.   Lipid Panel  Lab Results  Component Value Date   CHOL 168 08/20/2019   HDL 58 08/20/2019   LDLCALC 94 08/20/2019   TRIG 72 08/20/2019   CHOLHDL 2.9 08/20/2019     Updated lab needed at/ before next visit.

## 2020-05-17 NOTE — Assessment & Plan Note (Signed)
Uncontrolled flare of pain, short course of oral prednisone

## 2020-05-17 NOTE — Assessment & Plan Note (Signed)
  Patient re-educated about  the importance of commitment to a  minimum of 150 minutes of exercise per week as able.  The importance of healthy food choices with portion control discussed, as well as eating regularly and within a 12 hour window most days. The need to choose "clean , green" food 50 to 75% of the time is discussed, as well as to make water the primary drink and set a goal of 64 ounces water daily.    Weight /BMI 05/11/2020 03/20/2020 02/12/2020  WEIGHT 175 lb 1.9 oz 177 lb 177 lb  HEIGHT 5\' 6"  5\' 5"  5\' 6"   BMI 28.27 kg/m2 29.45 kg/m2 28.57 kg/m2

## 2020-05-19 ENCOUNTER — Other Ambulatory Visit: Payer: Self-pay | Admitting: Family Medicine

## 2020-05-20 ENCOUNTER — Other Ambulatory Visit: Payer: Self-pay | Admitting: Family Medicine

## 2020-05-27 DIAGNOSIS — I10 Essential (primary) hypertension: Secondary | ICD-10-CM | POA: Insufficient documentation

## 2020-05-27 DIAGNOSIS — I5042 Chronic combined systolic (congestive) and diastolic (congestive) heart failure: Secondary | ICD-10-CM | POA: Insufficient documentation

## 2020-06-03 DIAGNOSIS — E1122 Type 2 diabetes mellitus with diabetic chronic kidney disease: Secondary | ICD-10-CM | POA: Diagnosis not present

## 2020-06-03 DIAGNOSIS — Z79899 Other long term (current) drug therapy: Secondary | ICD-10-CM | POA: Diagnosis not present

## 2020-06-03 DIAGNOSIS — N189 Chronic kidney disease, unspecified: Secondary | ICD-10-CM | POA: Diagnosis not present

## 2020-06-03 DIAGNOSIS — D638 Anemia in other chronic diseases classified elsewhere: Secondary | ICD-10-CM | POA: Diagnosis not present

## 2020-06-03 DIAGNOSIS — I5042 Chronic combined systolic (congestive) and diastolic (congestive) heart failure: Secondary | ICD-10-CM | POA: Diagnosis not present

## 2020-06-03 DIAGNOSIS — I129 Hypertensive chronic kidney disease with stage 1 through stage 4 chronic kidney disease, or unspecified chronic kidney disease: Secondary | ICD-10-CM | POA: Diagnosis not present

## 2020-06-03 DIAGNOSIS — Z5181 Encounter for therapeutic drug level monitoring: Secondary | ICD-10-CM | POA: Diagnosis not present

## 2020-06-04 ENCOUNTER — Other Ambulatory Visit: Payer: Self-pay | Admitting: Nephrology

## 2020-06-04 DIAGNOSIS — D638 Anemia in other chronic diseases classified elsewhere: Secondary | ICD-10-CM

## 2020-06-04 DIAGNOSIS — E1122 Type 2 diabetes mellitus with diabetic chronic kidney disease: Secondary | ICD-10-CM

## 2020-06-04 DIAGNOSIS — I129 Hypertensive chronic kidney disease with stage 1 through stage 4 chronic kidney disease, or unspecified chronic kidney disease: Secondary | ICD-10-CM

## 2020-06-04 DIAGNOSIS — I5042 Chronic combined systolic (congestive) and diastolic (congestive) heart failure: Secondary | ICD-10-CM

## 2020-06-11 DIAGNOSIS — E1122 Type 2 diabetes mellitus with diabetic chronic kidney disease: Secondary | ICD-10-CM | POA: Diagnosis not present

## 2020-06-11 DIAGNOSIS — N189 Chronic kidney disease, unspecified: Secondary | ICD-10-CM | POA: Diagnosis not present

## 2020-06-11 DIAGNOSIS — I5042 Chronic combined systolic (congestive) and diastolic (congestive) heart failure: Secondary | ICD-10-CM | POA: Diagnosis not present

## 2020-06-11 DIAGNOSIS — Z79899 Other long term (current) drug therapy: Secondary | ICD-10-CM | POA: Diagnosis not present

## 2020-06-11 DIAGNOSIS — D638 Anemia in other chronic diseases classified elsewhere: Secondary | ICD-10-CM | POA: Diagnosis not present

## 2020-06-11 DIAGNOSIS — I129 Hypertensive chronic kidney disease with stage 1 through stage 4 chronic kidney disease, or unspecified chronic kidney disease: Secondary | ICD-10-CM | POA: Diagnosis not present

## 2020-06-12 ENCOUNTER — Ambulatory Visit (HOSPITAL_COMMUNITY)
Admission: RE | Admit: 2020-06-12 | Discharge: 2020-06-12 | Disposition: A | Discharge status: Home / Self Care | Payer: PPO | Source: Ambulatory Visit | Attending: Nephrology | Admitting: Nephrology

## 2020-06-12 ENCOUNTER — Other Ambulatory Visit: Payer: Self-pay

## 2020-06-12 DIAGNOSIS — I129 Hypertensive chronic kidney disease with stage 1 through stage 4 chronic kidney disease, or unspecified chronic kidney disease: Secondary | ICD-10-CM | POA: Insufficient documentation

## 2020-06-12 DIAGNOSIS — D638 Anemia in other chronic diseases classified elsewhere: Secondary | ICD-10-CM | POA: Diagnosis not present

## 2020-06-12 DIAGNOSIS — I5042 Chronic combined systolic (congestive) and diastolic (congestive) heart failure: Secondary | ICD-10-CM | POA: Diagnosis not present

## 2020-06-12 DIAGNOSIS — E1122 Type 2 diabetes mellitus with diabetic chronic kidney disease: Secondary | ICD-10-CM | POA: Diagnosis not present

## 2020-06-12 DIAGNOSIS — N189 Chronic kidney disease, unspecified: Secondary | ICD-10-CM | POA: Diagnosis not present

## 2020-06-17 ENCOUNTER — Other Ambulatory Visit: Payer: Self-pay | Admitting: Family Medicine

## 2020-06-24 ENCOUNTER — Other Ambulatory Visit: Payer: Self-pay

## 2020-06-24 ENCOUNTER — Emergency Department (HOSPITAL_COMMUNITY)
Admission: EM | Admit: 2020-06-24 | Discharge: 2020-06-25 | Disposition: A | Discharge status: Home / Self Care | Payer: PPO | Attending: Emergency Medicine | Admitting: Emergency Medicine

## 2020-06-24 ENCOUNTER — Encounter (HOSPITAL_COMMUNITY): Payer: Self-pay

## 2020-06-24 DIAGNOSIS — I130 Hypertensive heart and chronic kidney disease with heart failure and stage 1 through stage 4 chronic kidney disease, or unspecified chronic kidney disease: Secondary | ICD-10-CM | POA: Insufficient documentation

## 2020-06-24 DIAGNOSIS — Z7982 Long term (current) use of aspirin: Secondary | ICD-10-CM | POA: Diagnosis not present

## 2020-06-24 DIAGNOSIS — E1121 Type 2 diabetes mellitus with diabetic nephropathy: Secondary | ICD-10-CM | POA: Insufficient documentation

## 2020-06-24 DIAGNOSIS — N390 Urinary tract infection, site not specified: Secondary | ICD-10-CM

## 2020-06-24 DIAGNOSIS — Z79899 Other long term (current) drug therapy: Secondary | ICD-10-CM | POA: Insufficient documentation

## 2020-06-24 DIAGNOSIS — Z7984 Long term (current) use of oral hypoglycemic drugs: Secondary | ICD-10-CM | POA: Diagnosis not present

## 2020-06-24 DIAGNOSIS — N184 Chronic kidney disease, stage 4 (severe): Secondary | ICD-10-CM | POA: Insufficient documentation

## 2020-06-24 DIAGNOSIS — R531 Weakness: Secondary | ICD-10-CM | POA: Diagnosis not present

## 2020-06-24 DIAGNOSIS — I5042 Chronic combined systolic (congestive) and diastolic (congestive) heart failure: Secondary | ICD-10-CM | POA: Insufficient documentation

## 2020-06-24 DIAGNOSIS — R9431 Abnormal electrocardiogram [ECG] [EKG]: Secondary | ICD-10-CM | POA: Diagnosis not present

## 2020-06-24 DIAGNOSIS — Z794 Long term (current) use of insulin: Secondary | ICD-10-CM | POA: Insufficient documentation

## 2020-06-24 DIAGNOSIS — E1122 Type 2 diabetes mellitus with diabetic chronic kidney disease: Secondary | ICD-10-CM | POA: Diagnosis not present

## 2020-06-24 LAB — BASIC METABOLIC PANEL
Anion gap: 10 (ref 5–15)
BUN: 41 mg/dL — ABNORMAL HIGH (ref 8–23)
CO2: 25 mmol/L (ref 22–32)
Calcium: 9 mg/dL (ref 8.9–10.3)
Chloride: 101 mmol/L (ref 98–111)
Creatinine, Ser: 1.77 mg/dL — ABNORMAL HIGH (ref 0.44–1.00)
GFR, Estimated: 30 mL/min — ABNORMAL LOW (ref >60)
Glucose, Bld: 131 mg/dL — ABNORMAL HIGH (ref 70–99)
Potassium: 4.1 mmol/L (ref 3.5–5.1)
Sodium: 136 mmol/L (ref 135–145)

## 2020-06-24 LAB — URINALYSIS, ROUTINE W REFLEX MICROSCOPIC
Bilirubin Urine: NEGATIVE
Glucose, UA: NEGATIVE mg/dL
Hgb urine dipstick: NEGATIVE
Ketones, ur: NEGATIVE mg/dL
Nitrite: NEGATIVE
Protein, ur: NEGATIVE mg/dL
Specific Gravity, Urine: 1.009 (ref 1.005–1.030)
pH: 6 (ref 5.0–8.0)

## 2020-06-24 LAB — CBC
HCT: 43.2 % (ref 36.0–46.0)
Hemoglobin: 13.5 g/dL (ref 12.0–15.0)
MCH: 27.4 pg (ref 26.0–34.0)
MCHC: 31.3 g/dL (ref 30.0–36.0)
MCV: 87.6 fL (ref 80.0–100.0)
Platelets: 147 10*3/uL — ABNORMAL LOW (ref 150–400)
RBC: 4.93 MIL/uL (ref 3.87–5.11)
RDW: 15.2 % (ref 11.5–15.5)
WBC: 3.9 10*3/uL — ABNORMAL LOW (ref 4.0–10.5)
nRBC: 0 % (ref 0.0–0.2)

## 2020-06-24 LAB — CBG MONITORING, ED
Glucose-Capillary: 121 mg/dL — ABNORMAL HIGH (ref 70–99)
Glucose-Capillary: 111 mg/dL — ABNORMAL HIGH (ref 70–99)

## 2020-06-24 MED ORDER — SODIUM CHLORIDE 0.9 % IV SOLN
1.0000 g | Freq: Once | INTRAVENOUS | Status: AC
  Administered 2020-06-24: 1 g via INTRAVENOUS
  Filled 2020-06-24: qty 10

## 2020-06-24 MED ORDER — CEPHALEXIN 500 MG PO CAPS: 500.0000 mg | ORAL_CAPSULE | Freq: Two times a day (BID) | ORAL | 0 refills | Status: DC

## 2020-06-24 MED ORDER — SODIUM CHLORIDE 0.9 % IV BOLUS
500.0000 mL | Freq: Once | INTRAVENOUS | Status: AC
  Administered 2020-06-24: 500 mL via INTRAVENOUS

## 2020-06-24 NOTE — ED Triage Notes (Signed)
Pt states all week she has been feeling weak due to her legs- arthritis and gout. Now having cramps in her side. Says she has no energy

## 2020-06-24 NOTE — Discharge Instructions (Addendum)
If you develop fever, vomiting, new or worsening pain, weakness, or any other new/concerning symptoms then return to the ER for evaluation.

## 2020-06-24 NOTE — ED Provider Notes (Signed)
[facility] EMERGENCY DEPARTMENT Provider Note   CSN: [id number] Arrival date & time: 06/24/20  1923     History Chief Complaint  Patient presents with  . Weakness    [name] [name] is a 74 y.o. female.  HPI 75 year old female presents with generalized weakness. She has been feeling weak for about 1 week. Feels shaky when she walks. No headache, fever, vomiting, diarrhea, chest pain or abdominal pain. No dysuria but has been urinating more. Has chronic pain in her knees, no new pain or swelling. No focal weakness.   Past Medical History:  Diagnosis Date  . Anxiety   . Aortic stenosis   . Arthritis   . Biventricular ICD (implantable cardioverter-defibrillator) in place    March 2021, [employer]  -[name]  . Chronic combined systolic and diastolic CHF (congestive heart failure) ([place])   . CKD (chronic kidney disease) stage 3, GFR 30-59 ml/min (HCC)   . Constipation   . Depression   . Diabetes mellitus, type 2 ([place])   . Essential hypertension   . Hyperlipidemia   . Incidental pulmonary nodule, > 7m and < 860m   7 x 4 mm LLL nodule  . LBBB (left bundle branch block)   . Nonischemic cardiomyopathy (HCC)    No significant obstructive CAD at heart catheterization 05/2014, LVEF 20%  . Obesity   . Pneumonia 2013  . 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 Symptomatic PVCs     Patient Active Problem List   Diagnosis Date Noted  . Generalized osteoarthritis 07/26/2019  . Osteoarthritis of left knee 03/16/2017  . 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 Severe aortic valve stenosis 03/24/2015  . Hyperlipidemia LDL goal <100 05/24/2014  . Severe aortic stenosis 05/23/2014  . Incidental pulmonary nodule, > 68m68mnd < 8mm67m/29/2015  . Chronic combined systolic and diastolic CHF (congestive heart failure) (HCC)[place]. LBBB (left bundle  branch block) 12/03/2013  . 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 Seasonal allergies 02/22/2012  . Essential hypertension, benign   . Nonischemic cardiomyopathy (HCC)[place]/08/2011  . Type 2 diabetes mellitus with nephropathy (HCC)[place]/06/2008  . Overweight (BMI 25.0-29.9) 10/24/2007    Past Surgical History:  Procedure Laterality Date  . BIV ICD INSERTION CRT-D N/A 10/28/2019   Procedure: BIV ICD INSERTION CRT-D;  Surgeon: TaylEvans Lance;  [facility];  Service: Cardiovascular;  Laterality: N/A;  . CARDIAC CATHETERIZATION    . CESAREAN SECTION  1987  . FLEXIBLE BRONCHOSCOPY N/A 01/14/2015   Procedure: FLEXIBLE BRONCHOSCOPY;  Surgeon: StevMelrose Nakayama;  [facility];  Laterality: N/A;  . LEFT AND RIGHT HEART CATHETERIZATION WITH CORONARY ANGIOGRAM N/A 12/05/2011   Procedure: LEFT AND RIGHT HEART CATHETERIZATION WITH CORONARY ANGIOGRAM;  Surgeon: ChriBurnell Blanks;  [facility];  Service: Cardiovascular;  Laterality: N/A;  . LEFT AND RIGHT HEART CATHETERIZATION WITH CORONARY ANGIOGRAM N/A 05/23/2014   Procedure: LEFT AND RIGHT HEART CATHETERIZATION WITH CORONARY ANGIOGRAM;  Surgeon: ChriBurnell Blanks;  [facility];  Service: Cardiovascular;  Laterality: N/A;  . MULTIPLE EXTRACTIONS WITH ALVEOLOPLASTY N/A 02/27/2014   Procedure: Extraction of tooth #'s 2,4,5,6,7,8,9,10,11,12, 22, 23, 24, 25, 26, 28 with alveoloplasty and bilateral mandibular tori reductions;  Surgeon: RonaLenn CalS;  Location:  [place] OR;  Service: Oral Surgery;  Laterality: N/A;  . RIGID BRONCHOSCOPY N/A 01/14/2015   Procedure: RIGID BRONCHOSCOPY with Removal Of Foreign Body ;  Surgeon: [name], MD;  Location: [place];  Service: Thoracic;  Laterality: N/A;  . TEE WITHOUT CARDIOVERSION N/A 03/24/2015   Procedure: TRANSESOPHAGEAL ECHOCARDIOGRAM (TEE);  Surgeon: [name], MD;  Location: [place];   Service: Open Heart Surgery;  Laterality: N/A;  . TRANSCATHETER AORTIC VALVE REPLACEMENT, TRANSFEMORAL Bilateral 03/24/2015   Procedure: TRANSCATHETER AORTIC VALVE REPLACEMENT, TRANSFEMORAL;  Surgeon: [name], MD;  Location: [place];  Service: Open Heart Surgery;  Laterality: Bilateral;  . TUBAL LIGATION  1987  . VIDEO BRONCHOSCOPY Bilateral 06/05/2014   Procedure: VIDEO BRONCHOSCOPY WITHOUT FLUORO;  Surgeon: [name], MD;  [facility] ENDOSCOPY;  Service: Cardiopulmonary;  Laterality: Bilateral;     OB History   No obstetric history on file.     Family History  Problem Relation Age of Onset  . Heart disease Mother   . Heart attack Mother 64  . Diabetes Mother   . Hypertension Mother   . Colon cancer Father 14  . Breast cancer Sister   . Diabetes Brother   . Hypertension Brother   . Stroke Brother   . Cancer Brother   . Congestive Heart Failure Daughter   . Hypertension Son   . High Cholesterol Son   . Congestive Heart Failure Son     Social History   Tobacco Use  . Smoking status: Never Smoker  . Smokeless tobacco: Never Used  Vaping Use  . Vaping Use: Never used  Substance Use Topics  . Alcohol use: No  . Drug use: No    Home Medications Prior to Admission medications   Medication Sig Start Date End Date Taking? Authorizing Provider  acetaminophen (TYLENOL) 500 MG tablet Take one tablet two times daily, by mouth, for knee  pain Patient taking differently: Take 500 mg by mouth 2 (two) times daily as needed for moderate pain. Take one tablet two times daily, by mouth, for knee  pain 07/18/19   Fayrene Helper, MD  aspirin EC 81 MG tablet Take 81 mg by mouth every morning.     [provider]  blood glucose meter kit and supplies Dispense based on patient and insurance preference. Use to test three times daily (FOR ICD-10 E10.9, E11.9). 05/07/19   Fayrene Helper, MD  carvedilol (COREG) 25 MG tablet Take 1 tablet (25 mg total) by  mouth 2 (two) times daily with a meal. 05/01/20   [name], MD  cephALEXin (KEFLEX) 500 MG capsule Take 1 capsule (500 mg total) by mouth 2 (two) times daily for 7 days. 06/24/20 07/01/20  Sherwood Gambler, MD  colchicine 0.6 MG tablet Take 1 tablet (0.6 mg total) by mouth 2 (two) times daily. Take with food 12/16/19   Carole Civil, MD  diclofenac Sodium (VOLTAREN) 1 % GEL Apply 1 application topically 4 (four) times daily as needed (pain).    [provider]  ezetimibe (ZETIA) 10 MG tablet TAKE ONE TABLET BY MOUTH ONCE DAILY. 04/16/20   Fayrene Helper, MD  fenofibrate (TRICOR) 145 MG tablet TAKE (1) TABLET BY MOUTH ONCE DAILY. 06/17/20   Fayrene Helper, MD  furosemide (LASIX) 40 MG tablet TAKE (1) TABLET BY MOUTH EACH MORNING. 05/01/20   Fayrene Helper, MD  glipiZIDE (GLUCOTROL XL) 2.5 MG 24 hr tablet Take 1 tablet (2.5 mg total) by  mouth daily with breakfast. 01/28/20   Fayrene Helper, MD  Insulin Pen Needle (LITETOUCH PEN NEEDLES) 31G X 8 MM MISC USE AS NEEDED TO INJECT INSULIN. 07/18/19   Fayrene Helper, MD  Lancets [facility] DELICA PLUS [id number]) MISC USE 3 TIMES A DAY AS DIRECTED. 10/01/19   Fayrene Helper, MD  LIVALO 2 MG TABS TAKE ONE TABLET BY MOUTH ONCE DAILY AFTER SUPPER. 05/21/20   Perlie Mayo, NP  Multiple Vitamins-Minerals (MULTIVITAMIN PO) Take 1 tablet by mouth every morning.     [provider]  NOVOLOG MIX 70/30 FLEXPEN (70-30) 100 UNIT/ML FlexPen INJECT 12 TO 15 UNITS SUBCUTANEOUSLY TWICE DAILY. 06/17/20   Fayrene Helper, MD  ONETOUCH ULTRA test strip CHECK BLOOD SUGAR 3 TIMES A DAY. 11/14/19   Fayrene Helper, MD  potassium chloride (KLOR-CON) 10 MEQ tablet TAKE ONE TABLET BY MOUTH DAILY. DO NOT TAKE IF NOT TAKING FUROSEMIDE. 06/17/20   Fayrene Helper, MD  predniSONE (DELTASONE) 10 MG tablet Take 1 tablet (10 mg total) by mouth 2 (two) times daily with a meal. 05/11/20   Fayrene Helper, MD  spironolactone  (ALDACTONE) 25 MG tablet TAKE ONE TABLET BY MOUTH ONCE DAILY. 01/09/20   Fayrene Helper, MD  UNABLE TO FIND Shower bench x 1   DX A41.66 05/02/19   Fayrene Helper, MD  UNABLE TO FIND Rolling walker with seat x 1  DX m17.12 05/02/19   Fayrene Helper, MD  cetirizine (ZYRTEC) 10 MG tablet Take 1 tablet (10 mg total) by mouth daily. 06/30/11 04/19/18  Fayrene Helper, MD  ferrous sulfate 325 (65 FE) MG EC tablet Take 1 tablet (325 mg total) by mouth 2 (two) times daily. 07/14/11 10/28/11  Fayrene Helper, MD  FLUoxetine (PROZAC) 10 MG tablet Take 1 tablet (10 mg total) by mouth daily. Take one capsule by mouth once a day 12/20/10 11/03/11  Fayrene Helper, MD    Allergies    Crestor [rosuvastatin], Tramadol, and Penicillins  Review of Systems   Review of Systems  Constitutional: Positive for fatigue. Negative for fever.  Respiratory: Negative for shortness of breath.   Cardiovascular: Negative for chest pain.  Gastrointestinal: Negative for abdominal pain, diarrhea and vomiting.  Genitourinary: Positive for frequency. Negative for dysuria.  Neurological: Positive for weakness. Negative for headaches.  All other systems reviewed and are negative.   Physical Exam Updated Vital Signs BP 96/69   Pulse 63   Temp 99.3 F (37.4 C) (Oral)   Resp 18   Ht _0  (1.676 m)   Wt 79.8 kg   SpO2 99%   BMI 28.41 kg/m   Physical Exam Vitals and nursing note reviewed.  Constitutional:      Appearance: She is well-developed.  HENT:     Head: Normocephalic and atraumatic.     Right Ear: External ear normal.     Left Ear: External ear normal.     Nose: Nose normal.     Mouth/Throat:     Comments: Mildly dry mucous membranes Eyes:     General:        Right eye: No discharge.        Left eye: No discharge.     Extraocular Movements: Extraocular movements intact.     Pupils: Pupils are equal, round, and reactive to light.  Cardiovascular:     Rate and Rhythm: Normal rate  and regular rhythm.     Heart sounds: Normal heart sounds.  Pulmonary:     Effort: Pulmonary effort is normal.     Breath sounds: Normal breath sounds.  Abdominal:     Palpations: Abdomen is soft.     Tenderness: There is no abdominal tenderness.  Musculoskeletal:        General: No swelling.     Right knee: No swelling, deformity, effusion or erythema. Normal range of motion. No tenderness.     Left knee: No swelling, deformity, effusion or erythema. Normal range of motion. No tenderness.  Skin:    General: Skin is warm and dry.  Neurological:     Mental Status: She is alert.     Comments: CN 3-12 grossly intact. 5/5 strength in all 4 extremities. Grossly normal sensation. Normal finger to nose.   Psychiatric:        Mood and Affect: Mood is not anxious.     ED Results / Procedures / Treatments   Labs (all labs ordered are listed, but only abnormal results are displayed) Labs Reviewed  BASIC METABOLIC PANEL - Abnormal; Notable for the following components:      Result Value   Glucose, Bld 131 (*)    BUN 41 (*)    Creatinine, Ser 1.77 (*)    GFR, Estimated 30 (*)    All other components within normal limits  CBC - Abnormal; Notable for the following components:   WBC 3.9 (*)    Platelets 147 (*)    All other components within normal limits  URINALYSIS, ROUTINE W REFLEX MICROSCOPIC - Abnormal; Notable for the following components:   Leukocytes,Ua MODERATE (*)    Bacteria, UA MANY (*)    All other components within normal limits  CBG MONITORING, ED - Abnormal; Notable for the following components:   Glucose-Capillary 121 (*)    All other components within normal limits  CBG MONITORING, ED - Abnormal; Notable for the following components:   Glucose-Capillary 111 (*)    All other components within normal limits  URINE CULTURE    EKG EKG Interpretation  Date/Time:  Wednesday June 24 2020 20:39:25 EST Ventricular Rate:  77 PR Interval:  118 QRS Duration: 140 QT  Interval:  432 QTC Calculation: 488 R Axis:   -28 Text Interpretation: Atrial-sensed ventricular-paced rhythm Abnormal ECG Confirmed by Sherwood Gambler [phone]) on 06/24/2020 9:14:27 PM   Radiology No results found.  Procedures Procedures (including critical care time)  Medications Ordered in ED Medications  cefTRIAXone (ROCEPHIN) 1 g in sodium chloride 0.9 % 100 mL IVPB (1 g Intravenous New Bag/Given 06/24/20 2335)  sodium chloride 0.9 % bolus 500 mL (0 mLs Intravenous Stopped 06/24/20 2334)    ED Course  I have reviewed the triage vital signs and the nursing notes.  Pertinent labs & imaging results that were available during my care of the patient were reviewed by me and considered in my medical decision making (see chart for details).    MDM Rules/Calculators/A&P                          Patient feels generally weak, no focal weakness. Doubt CNS cause. .  Labs have been reviewed and are at baseline.  Her urine does show UTI and combined with urinary frequency I think this is symptomatic.  She will be given a small fluid bolus.  Chart review shows pan-sensitive E. Coli UTI in past.  Will give dose of IV Rocephin and discharged on antibiotics.  She otherwise appears stable for  discharge home. Final Clinical Impression(s) / ED Diagnoses Final diagnoses:  Acute urinary tract infection    Rx / DC Orders ED Discharge Orders         Ordered    cephALEXin (KEFLEX) 500 MG capsule  2 times daily        06/24/20 2318           Sherwood Gambler, MD 06/24/20 2350

## 2020-06-27 ENCOUNTER — Emergency Department (HOSPITAL_COMMUNITY)
Admission: EM | Admit: 2020-06-27 | Discharge: 2020-06-27 | Disposition: A | Discharge status: Home / Self Care | Payer: PPO | Source: Home / Self Care | Attending: Emergency Medicine | Admitting: Emergency Medicine

## 2020-06-27 ENCOUNTER — Emergency Department (HOSPITAL_COMMUNITY): Payer: PPO

## 2020-06-27 ENCOUNTER — Other Ambulatory Visit: Payer: Self-pay

## 2020-06-27 ENCOUNTER — Encounter (HOSPITAL_COMMUNITY): Payer: Self-pay | Admitting: *Deleted

## 2020-06-27 DIAGNOSIS — Z95810 Presence of automatic (implantable) cardiac defibrillator: Secondary | ICD-10-CM | POA: Diagnosis not present

## 2020-06-27 DIAGNOSIS — J189 Pneumonia, unspecified organism: Secondary | ICD-10-CM | POA: Diagnosis not present

## 2020-06-27 DIAGNOSIS — Z794 Long term (current) use of insulin: Secondary | ICD-10-CM | POA: Insufficient documentation

## 2020-06-27 DIAGNOSIS — I10 Essential (primary) hypertension: Secondary | ICD-10-CM | POA: Diagnosis not present

## 2020-06-27 DIAGNOSIS — E1121 Type 2 diabetes mellitus with diabetic nephropathy: Secondary | ICD-10-CM | POA: Diagnosis not present

## 2020-06-27 DIAGNOSIS — R0602 Shortness of breath: Secondary | ICD-10-CM | POA: Diagnosis not present

## 2020-06-27 DIAGNOSIS — R0902 Hypoxemia: Secondary | ICD-10-CM | POA: Diagnosis present

## 2020-06-27 DIAGNOSIS — Z880 Allergy status to penicillin: Secondary | ICD-10-CM | POA: Diagnosis not present

## 2020-06-27 DIAGNOSIS — I5042 Chronic combined systolic (congestive) and diastolic (congestive) heart failure: Secondary | ICD-10-CM | POA: Diagnosis present

## 2020-06-27 DIAGNOSIS — I5041 Acute combined systolic (congestive) and diastolic (congestive) heart failure: Secondary | ICD-10-CM | POA: Insufficient documentation

## 2020-06-27 DIAGNOSIS — E1122 Type 2 diabetes mellitus with diabetic chronic kidney disease: Secondary | ICD-10-CM | POA: Diagnosis present

## 2020-06-27 DIAGNOSIS — Z885 Allergy status to narcotic agent status: Secondary | ICD-10-CM | POA: Diagnosis not present

## 2020-06-27 DIAGNOSIS — N184 Chronic kidney disease, stage 4 (severe): Secondary | ICD-10-CM | POA: Insufficient documentation

## 2020-06-27 DIAGNOSIS — E0821 Diabetes mellitus due to underlying condition with diabetic nephropathy: Secondary | ICD-10-CM | POA: Insufficient documentation

## 2020-06-27 DIAGNOSIS — M2550 Pain in unspecified joint: Secondary | ICD-10-CM | POA: Diagnosis not present

## 2020-06-27 DIAGNOSIS — U071 COVID-19: Secondary | ICD-10-CM

## 2020-06-27 DIAGNOSIS — Z888 Allergy status to other drugs, medicaments and biological substances status: Secondary | ICD-10-CM | POA: Diagnosis not present

## 2020-06-27 DIAGNOSIS — Z952 Presence of prosthetic heart valve: Secondary | ICD-10-CM | POA: Diagnosis not present

## 2020-06-27 DIAGNOSIS — Z7401 Bed confinement status: Secondary | ICD-10-CM | POA: Diagnosis not present

## 2020-06-27 DIAGNOSIS — I129 Hypertensive chronic kidney disease with stage 1 through stage 4 chronic kidney disease, or unspecified chronic kidney disease: Secondary | ICD-10-CM | POA: Insufficient documentation

## 2020-06-27 DIAGNOSIS — I130 Hypertensive heart and chronic kidney disease with heart failure and stage 1 through stage 4 chronic kidney disease, or unspecified chronic kidney disease: Secondary | ICD-10-CM | POA: Diagnosis present

## 2020-06-27 DIAGNOSIS — Z7982 Long term (current) use of aspirin: Secondary | ICD-10-CM | POA: Diagnosis not present

## 2020-06-27 DIAGNOSIS — R531 Weakness: Secondary | ICD-10-CM | POA: Diagnosis not present

## 2020-06-27 DIAGNOSIS — Z79899 Other long term (current) drug therapy: Secondary | ICD-10-CM | POA: Diagnosis not present

## 2020-06-27 DIAGNOSIS — E785 Hyperlipidemia, unspecified: Secondary | ICD-10-CM | POA: Diagnosis present

## 2020-06-27 DIAGNOSIS — Z7952 Long term (current) use of systemic steroids: Secondary | ICD-10-CM | POA: Diagnosis not present

## 2020-06-27 DIAGNOSIS — Z8249 Family history of ischemic heart disease and other diseases of the circulatory system: Secondary | ICD-10-CM | POA: Diagnosis not present

## 2020-06-27 DIAGNOSIS — Z8349 Family history of other endocrine, nutritional and metabolic diseases: Secondary | ICD-10-CM | POA: Diagnosis not present

## 2020-06-27 DIAGNOSIS — R059 Cough, unspecified: Secondary | ICD-10-CM | POA: Diagnosis not present

## 2020-06-27 DIAGNOSIS — R5381 Other malaise: Secondary | ICD-10-CM | POA: Diagnosis not present

## 2020-06-27 DIAGNOSIS — J9601 Acute respiratory failure with hypoxia: Secondary | ICD-10-CM | POA: Diagnosis present

## 2020-06-27 DIAGNOSIS — J1282 Pneumonia due to coronavirus disease 2019: Secondary | ICD-10-CM | POA: Diagnosis present

## 2020-06-27 DIAGNOSIS — I428 Other cardiomyopathies: Secondary | ICD-10-CM | POA: Diagnosis present

## 2020-06-27 LAB — CBC
HCT: 40.6 % (ref 36.0–46.0)
Hemoglobin: 13.2 g/dL (ref 12.0–15.0)
MCH: 28 pg (ref 26.0–34.0)
MCHC: 32.5 g/dL (ref 30.0–36.0)
MCV: 86.2 fL (ref 80.0–100.0)
Platelets: 186 10*3/uL (ref 150–400)
RBC: 4.71 MIL/uL (ref 3.87–5.11)
RDW: 14.9 % (ref 11.5–15.5)
WBC: 3.8 10*3/uL — ABNORMAL LOW (ref 4.0–10.5)
nRBC: 0 % (ref 0.0–0.2)

## 2020-06-27 LAB — COMPREHENSIVE METABOLIC PANEL
ALT: 28 U/L (ref 0–44)
AST: 45 U/L — ABNORMAL HIGH (ref 15–41)
Albumin: 3.3 g/dL — ABNORMAL LOW (ref 3.5–5.0)
Alkaline Phosphatase: 34 U/L — ABNORMAL LOW (ref 38–126)
Anion gap: 11 (ref 5–15)
BUN: 49 mg/dL — ABNORMAL HIGH (ref 8–23)
CO2: 22 mmol/L (ref 22–32)
Calcium: 8.8 mg/dL — ABNORMAL LOW (ref 8.9–10.3)
Chloride: 105 mmol/L (ref 98–111)
Creatinine, Ser: 1.87 mg/dL — ABNORMAL HIGH (ref 0.44–1.00)
GFR, Estimated: 28 mL/min — ABNORMAL LOW (ref >60)
Glucose, Bld: 136 mg/dL — ABNORMAL HIGH (ref 70–99)
Potassium: 4.2 mmol/L (ref 3.5–5.1)
Sodium: 138 mmol/L (ref 135–145)
Total Bilirubin: 0.7 mg/dL (ref 0.3–1.2)
Total Protein: 6.8 g/dL (ref 6.5–8.1)

## 2020-06-27 LAB — URINE CULTURE: Culture: >=100000 — AB

## 2020-06-27 LAB — RESPIRATORY PANEL BY RT PCR (FLU A&B, COVID)
Influenza A by PCR: NEGATIVE
Influenza B by PCR: NEGATIVE
SARS Coronavirus 2 by RT PCR: POSITIVE — AB

## 2020-06-27 MED ORDER — SODIUM CHLORIDE 0.9 % IV SOLN: INTRAVENOUS | Status: DC | PRN

## 2020-06-27 MED ORDER — METHYLPREDNISOLONE SODIUM SUCC 125 MG IJ SOLR: 125.0000 mg | Freq: Once | INTRAMUSCULAR | Status: DC | PRN

## 2020-06-27 MED ORDER — SODIUM CHLORIDE 0.9 % IV SOLN
1200.0000 mg | Freq: Once | INTRAVENOUS | Status: AC
  Administered 2020-06-27: 1200 mg via INTRAVENOUS
  Filled 2020-06-27: qty 10

## 2020-06-27 MED ORDER — FAMOTIDINE IN NACL 20-0.9 MG/50ML-% IV SOLN: 20.0000 mg | Freq: Once | INTRAVENOUS | Status: DC | PRN

## 2020-06-27 MED ORDER — SODIUM CHLORIDE 0.9 % IV BOLUS
500.0000 mL | Freq: Once | INTRAVENOUS | Status: AC
  Administered 2020-06-27: 500 mL via INTRAVENOUS

## 2020-06-27 MED ORDER — ACETAMINOPHEN 500 MG PO TABS
1000.0000 mg | ORAL_TABLET | Freq: Once | ORAL | Status: AC
  Administered 2020-06-27: 1000 mg via ORAL
  Filled 2020-06-27: qty 2

## 2020-06-27 MED ORDER — DIPHENHYDRAMINE HCL 50 MG/ML IJ SOLN: 50.0000 mg | Freq: Once | INTRAMUSCULAR | Status: DC | PRN

## 2020-06-27 MED ORDER — ALBUTEROL SULFATE HFA 108 (90 BASE) MCG/ACT IN AERS: 2.0000 | INHALATION_SPRAY | Freq: Once | RESPIRATORY_TRACT | Status: DC | PRN

## 2020-06-27 MED ORDER — EPINEPHRINE 0.3 MG/0.3ML IJ SOAJ: 0.3000 mg | Freq: Once | INTRAMUSCULAR | Status: DC | PRN

## 2020-06-27 NOTE — ED Notes (Signed)
Pt up and ambulatory to restroom at this time without assistance from staff.

## 2020-06-27 NOTE — ED Notes (Signed)
Date and time results received: 06/27/20 1815   Test: COVID Critical Value: Positive  Name of Provider Notified: Steinl  Orders Received? Or Actions Taken?: No new orders given.

## 2020-06-27 NOTE — ED Notes (Signed)
[name], patient's son, provided with an update on the current plan of care. All questions and concerns voiced addressed at this time.

## 2020-06-27 NOTE — ED Notes (Addendum)
At patient request, staff can provide updates to patient's son [name] at [phone]. This RN verbalized understanding and is in agreement at this time.  5:32 PM [name], patient's son provided and update on patient condition and current plan of care. Al questions and concerns voiced addressed at this time. Educated on pending COVID test and that staff will contact once resulted. In agreement at this time.

## 2020-06-27 NOTE — ED Notes (Signed)
I assumed care of this patient at this time. At the time of assuming care, bed is locked in the lowest position, side rails x2, call bell within reach. Pt educated on hourly rounding and call light use, verbalized understanding at this time. Pt on continuous cardiac monitor with vital signs cycling q30 minutes. Pt appears sleepy but in no acute distress, answers questions correctly. Will continue to monitor at this time.

## 2020-06-27 NOTE — Discharge Instructions (Signed)
It was our pleasure to provide your ER care today - we hope that you feel better.  Your covid test is positive - see covid information/instructions.  Stay adequately hydrated and get adequate nutrition. Stay active, take full and deep breaths. Take acetaminophen or ibuprofen as need for fever and/or body aches.  Return to ER if worse, new symptoms, increased trouble breathing, weak/fainting, or other concern.

## 2020-06-27 NOTE — ED Triage Notes (Signed)
Pt dx with UTI on Thursday and d/c home, pt since with generalized weakness and cough that is clear.  EMS reports CBG 179 temp 97.2 and bp 112/70.  Family concerned about possible PNA.

## 2020-06-27 NOTE — ED Provider Notes (Signed)
[name] [name]   CSN: [id number] Arrival date & time: 06/27/20  1414     History Chief Complaint  Patient presents with  . Weakness    [name] [name] is a 74 y.o. female.  Patient c/o non prod cough, general weakness, for the past 3- 4 days. Symptoms acute onset, moderate, constant, persistent. States two days ago was diagnosed with uti, and is taking antibiotic as prescribed. Denies dysuria. No abd or flank pain. Denies headache. +poor po intake in past few days. Denies sore throat or runny nose. No specific known covid exposure or known ill contacts. No chest pain or discomfort. No abd pain. No vomiting or diarrhea. No faintness or dizziness. Feels generally weak, no unilateral numbness or weakness. No change in speech or vision.  The history is provided by the patient.  Weakness Associated symptoms: cough   Associated symptoms: no abdominal pain, no chest pain, no diarrhea, no dysuria, no fever, no headaches, no shortness of breath and no vomiting        Past Medical History:  Diagnosis Date  . Anxiety   . Aortic stenosis   . Arthritis   . Biventricular ICD (implantable cardioverter-defibrillator) in place    March 2021, [employer]  -[name]  . Chronic combined systolic and diastolic CHF (congestive heart failure) ([place])   . CKD (chronic kidney disease) stage 3, GFR 30-59 ml/min (HCC)   . Constipation   . Depression   . Diabetes mellitus, type 2 ([place])   . Essential hypertension   . Hyperlipidemia   . Incidental pulmonary nodule, > 60m and < 810m   7 x 4 mm LLL nodule  . LBBB (left bundle branch block)   . Nonischemic cardiomyopathy (HCC)    No significant obstructive CAD at heart catheterization 05/2014, LVEF 20%  . Obesity   . Pneumonia 2013  . 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 Symptomatic PVCs     Patient  Active Problem List   Diagnosis Date Noted  . Generalized osteoarthritis 07/26/2019  . Osteoarthritis of left knee 03/16/2017  . 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 Severe aortic valve stenosis 03/24/2015  . Hyperlipidemia LDL goal <100 05/24/2014  . Severe aortic stenosis 05/23/2014  . Incidental pulmonary nodule, > 60m68mnd < 8mm84m/29/2015  . Chronic combined systolic and diastolic CHF (congestive heart failure) (HCC)[place]. LBBB (left bundle branch block) 12/03/2013  . 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 Seasonal allergies 02/22/2012  . Essential hypertension, benign   . Nonischemic cardiomyopathy (HCC)[place]/08/2011  . 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 Overweight (BMI 25.0-29.9) 10/24/2007    Past Surgical History:  Procedure Laterality Date  . BIV ICD INSERTION CRT-D N/A 10/28/2019   Procedure: BIV ICD INSERTION CRT-D;  Surgeon: TaylEvans Lance;  [facility];  Service: Cardiovascular;  Laterality: N/A;  . CARDIAC CATHETERIZATION    . CESAREAN SECTION  1987  . FLEXIBLE BRONCHOSCOPY N/A 01/14/2015   Procedure: FLEXIBLE BRONCHOSCOPY;  Surgeon: StevMelrose Nakayama;  [facility];  Laterality: N/A;  . LEFT AND RIGHT HEART CATHETERIZATION WITH CORONARY ANGIOGRAM N/A 12/05/2011   Procedure: LEFT AND RIGHT HEART CATHETERIZATION WITH CORONARY ANGIOGRAM;  Surgeon: ChriBurnell Blanks;  [facility];  Service: Cardiovascular;  Laterality: N/A;  . LEFT AND RIGHT HEART CATHETERIZATION WITH CORONARY ANGIOGRAM N/A 05/23/2014   Procedure: LEFT AND RIGHT HEART CATHETERIZATION WITH CORONARY ANGIOGRAM;  Surgeon: [name], MD;  Location: [facility] CATH LAB;  Service: Cardiovascular;  Laterality: N/A;  . MULTIPLE EXTRACTIONS WITH ALVEOLOPLASTY N/A 02/27/2014   Procedure: Extraction of tooth #'s 2,4,5,6,7,8,9,10,11,12, 22, 23, 24, 25, 26, 28 with alveoloplasty  and bilateral mandibular tori reductions;  Surgeon: [name], DDS;  Location: [place];  Service: Oral Surgery;  Laterality: N/A;  . RIGID BRONCHOSCOPY N/A 01/14/2015   Procedure: RIGID BRONCHOSCOPY with Removal Of Foreign Body ;  Surgeon: [name], MD;  Location: [place];  Service: Thoracic;  Laterality: N/A;  . TEE WITHOUT CARDIOVERSION N/A 03/24/2015   Procedure: TRANSESOPHAGEAL ECHOCARDIOGRAM (TEE);  Surgeon: [name], MD;  Location: [place];  Service: Open Heart Surgery;  Laterality: N/A;  . TRANSCATHETER AORTIC VALVE REPLACEMENT, TRANSFEMORAL Bilateral 03/24/2015   Procedure: TRANSCATHETER AORTIC VALVE REPLACEMENT, TRANSFEMORAL;  Surgeon: [name], MD;  Location: [place];  Service: Open Heart Surgery;  Laterality: Bilateral;  . TUBAL LIGATION  1987  . VIDEO BRONCHOSCOPY Bilateral 06/05/2014   Procedure: VIDEO BRONCHOSCOPY WITHOUT FLUORO;  Surgeon: [name], MD;  Location: [facility];  Service: Cardiopulmonary;  Laterality: Bilateral;     OB History   No obstetric history on file.     Family History  Problem Relation Age of Onset  . Heart disease Mother   . Heart attack Mother 28  . Diabetes Mother   . Hypertension Mother   . Colon cancer Father 59  . Breast cancer Sister   . Diabetes Brother   . Hypertension Brother   . Stroke Brother   . Cancer Brother   . Congestive Heart Failure Daughter   . Hypertension Son   . High Cholesterol Son   . Congestive Heart Failure Son     Social History   Tobacco Use  . Smoking status: Never Smoker  . Smokeless tobacco: Never Used  Vaping Use  . Vaping Use: Never used  Substance Use Topics  . Alcohol use: No  . Drug use: No    Home Medications Prior to Admission medications   Medication Sig Start Date End Date Taking? Authorizing Provider  acetaminophen (TYLENOL) 500 MG tablet Take one tablet two times daily, by mouth, for knee  pain Patient taking differently: Take 500 mg by mouth  2 (two) times daily as needed for moderate pain. Take one tablet two times daily, by mouth, for knee  pain 07/18/19   Fayrene Helper, MD  aspirin EC 81 MG tablet Take 81 mg by mouth every morning.     [provider]  blood glucose meter kit and supplies Dispense based on patient and insurance preference. Use to test three times daily (FOR ICD-10 E10.9, E11.9). 05/07/19   Fayrene Helper, MD  carvedilol (COREG) 25 MG tablet Take 1 tablet (25 mg total) by mouth 2 (two) times daily with a meal. 05/01/20   [name], MD  cephALEXin (KEFLEX) 500 MG capsule Take 1 capsule (500 mg total) by mouth 2 (two) times daily for 7 days. 06/24/20 07/01/20  Sherwood Gambler, MD  colchicine 0.6 MG tablet Take 1 tablet (0.6 mg total) by mouth 2 (two) times daily. Take with food 12/16/19   Carole Civil, MD  diclofenac Sodium (VOLTAREN) 1 % GEL Apply 1 application topically 4 (four) times daily as needed (pain).  [provider]  ezetimibe (ZETIA) 10 MG tablet TAKE ONE TABLET BY MOUTH ONCE DAILY. 04/16/20   Fayrene Helper, MD  fenofibrate (TRICOR) 145 MG tablet TAKE (1) TABLET BY MOUTH ONCE DAILY. 06/17/20   Fayrene Helper, MD  furosemide (LASIX) 40 MG tablet TAKE (1) TABLET BY MOUTH EACH MORNING. 05/01/20   Fayrene Helper, MD  glipiZIDE (GLUCOTROL XL) 2.5 MG 24 hr tablet Take 1 tablet (2.5 mg total) by mouth daily with breakfast. 01/28/20   Fayrene Helper, MD  Insulin Pen Needle (LITETOUCH PEN NEEDLES) 31G X 8 MM MISC USE AS NEEDED TO INJECT INSULIN. 07/18/19   Fayrene Helper, MD  Lancets [facility] DELICA PLUS [id number]) MISC USE 3 TIMES A DAY AS DIRECTED. 10/01/19   Fayrene Helper, MD  LIVALO 2 MG TABS TAKE ONE TABLET BY MOUTH ONCE DAILY AFTER SUPPER. 05/21/20   Perlie Mayo, NP  Multiple Vitamins-Minerals (MULTIVITAMIN PO) Take 1 tablet by mouth every morning.     [provider]  NOVOLOG MIX 70/30 FLEXPEN (70-30) 100 UNIT/ML FlexPen INJECT 12 TO  15 UNITS SUBCUTANEOUSLY TWICE DAILY. 06/17/20   Fayrene Helper, MD  ONETOUCH ULTRA test strip CHECK BLOOD SUGAR 3 TIMES A DAY. 11/14/19   Fayrene Helper, MD  potassium chloride (KLOR-CON) 10 MEQ tablet TAKE ONE TABLET BY MOUTH DAILY. DO NOT TAKE IF NOT TAKING FUROSEMIDE. 06/17/20   Fayrene Helper, MD  predniSONE (DELTASONE) 10 MG tablet Take 1 tablet (10 mg total) by mouth 2 (two) times daily with a meal. 05/11/20   Fayrene Helper, MD  spironolactone (ALDACTONE) 25 MG tablet TAKE ONE TABLET BY MOUTH ONCE DAILY. 01/09/20   Fayrene Helper, MD  UNABLE TO FIND Shower bench x 1   DX Q33.35 05/02/19   Fayrene Helper, MD  UNABLE TO FIND Rolling walker with seat x 1  DX m17.12 05/02/19   Fayrene Helper, MD  cetirizine (ZYRTEC) 10 MG tablet Take 1 tablet (10 mg total) by mouth daily. 06/30/11 04/19/18  Fayrene Helper, MD  ferrous sulfate 325 (65 FE) MG EC tablet Take 1 tablet (325 mg total) by mouth 2 (two) times daily. 07/14/11 10/28/11  Fayrene Helper, MD  FLUoxetine (PROZAC) 10 MG tablet Take 1 tablet (10 mg total) by mouth daily. Take one capsule by mouth once a day 12/20/10 11/03/11  Fayrene Helper, MD    Allergies    Crestor [rosuvastatin], Tramadol, and Penicillins  Review of Systems   Review of Systems  Constitutional: Negative for chills and fever.  HENT: Negative for sore throat.   Eyes: Negative for redness.  Respiratory: Positive for cough. Negative for shortness of breath.   Cardiovascular: Negative for chest pain.  Gastrointestinal: Negative for abdominal pain, diarrhea and vomiting.  Endocrine: Negative for polyuria.  Genitourinary: Negative for dysuria and flank pain.  Musculoskeletal: Negative for back pain, neck pain and neck stiffness.  Skin: Negative for rash.  Neurological: Positive for weakness. Negative for headaches.  Hematological: Does not bruise/bleed easily.  Psychiatric/Behavioral: Negative for confusion.    Physical  Exam Updated Vital Signs BP 110/63 (BP Location: Right Arm)   Pulse 75   Temp 99.1 F (37.3 C) (Oral)   Resp 20   SpO2 93%   Physical Exam Vitals and nursing [name] reviewed.  Constitutional:      Appearance: Normal appearance. She is well-developed.  HENT:     Head: Atraumatic.     Nose: Nose normal.  Mouth/Throat:     Mouth: Mucous membranes are moist.     Pharynx: Oropharynx is clear. No oropharyngeal exudate or posterior oropharyngeal erythema.  Eyes:     General: No scleral icterus.    Conjunctiva/sclera: Conjunctivae normal.     Pupils: Pupils are equal, round, and reactive to light.  Neck:     Trachea: No tracheal deviation.     Comments: No stiffness or rigidity.  Cardiovascular:     Rate and Rhythm: Normal rate and regular rhythm.     Pulses: Normal pulses.     Heart sounds: Normal heart sounds. No murmur heard.  No friction rub. No gallop.   Pulmonary:     Effort: Pulmonary effort is normal. No respiratory distress.     Breath sounds: Normal breath sounds.  Abdominal:     General: Bowel sounds are normal. There is no distension.     Palpations: Abdomen is soft.     Tenderness: There is no abdominal tenderness. There is no guarding.  Genitourinary:    Comments: No cva tenderness.  Musculoskeletal:        General: No swelling or tenderness.     Cervical back: Normal range of motion and neck supple. No rigidity. No muscular tenderness.  Skin:    General: Skin is warm and dry.     Findings: No rash.  Neurological:     Mental Status: She is alert.     Comments: Alert, speech normal.   Psychiatric:        Mood and Affect: Mood normal.     ED Results / Procedures / Treatments   Labs (all labs ordered are listed, but only abnormal results are displayed) Results for orders placed or performed during the hospital encounter of 06/27/20  Respiratory Panel by RT PCR (Flu A&B, Covid) - Nasopharyngeal Swab   Specimen: Nasopharyngeal Swab  Result Value Ref Range    SARS Coronavirus 2 by RT PCR POSITIVE (A) NEGATIVE   Influenza A by PCR NEGATIVE NEGATIVE   Influenza B by PCR NEGATIVE NEGATIVE  Comprehensive metabolic panel  Result Value Ref Range   Sodium 138 135 - 145 mmol/L   Potassium 4.2 3.5 - 5.1 mmol/L   Chloride 105 98 - 111 mmol/L   CO2 22 22 - 32 mmol/L   Glucose, Bld 136 (H) 70 - 99 mg/dL   BUN 49 (H) 8 - 23 mg/dL   Creatinine, Ser 1.87 (H) 0.44 - 1.00 mg/dL   Calcium 8.8 (L) 8.9 - 10.3 mg/dL   Total Protein 6.8 6.5 - 8.1 g/dL   Albumin 3.3 (L) 3.5 - 5.0 g/dL   AST 45 (H) 15 - 41 U/L   ALT 28 0 - 44 U/L   Alkaline Phosphatase 34 (L) 38 - 126 U/L   Total Bilirubin 0.7 0.3 - 1.2 mg/dL   GFR, Estimated 28 (L) >60 mL/min   Anion gap 11 5 - 15  CBC  Result Value Ref Range   WBC 3.8 (L) 4.0 - 10.5 K/uL   RBC 4.71 3.87 - 5.11 MIL/uL   Hemoglobin 13.2 12.0 - 15.0 g/dL   HCT 40.6 36 - 46 %   MCV 86.2 80.0 - 100.0 fL   MCH 28.0 26.0 - 34.0 pg   MCHC 32.5 30.0 - 36.0 g/dL   RDW 14.9 11.5 - 15.5 %   Platelets 186 150 - 400 K/uL   nRBC 0.0 0.0 - 0.2 %   US RENAL  Result Date: 06/14/2020 CLINICAL DATA:  Chronic renal disease. EXAM: RENAL / URINARY TRACT ULTRASOUND COMPLETE COMPARISON:  None. FINDINGS: Right Kidney: Renal measurements: 11.5 x 4.6 x 4.6 cm = volume: 127 mL. Increased cortical echogenicity. Left Kidney: Renal measurements: 10.5 x 5.7 x 4.6 cm = volume: 142 mL. Increased cortical echogenicity. Bladder: Appears normal for degree of bladder distention. Other: None. IMPRESSION: Medical renal disease.  No acute abnormalities noted. Electronically Signed   By: Dorise Bullion III M.D   On: 06/14/2020 11:30   DG Chest Portable 1 View  Result Date: 06/27/2020 CLINICAL DATA:  Cough EXAM: PORTABLE CHEST 1 VIEW COMPARISON:  December 05, 2019 FINDINGS: The cardiomediastinal silhouette is unchanged in contour. Status post TAVR. LEFT chest AICD. No pleural effusion. No pneumothorax. There are RIGHT greater than LEFT peripheral predominant  heterogeneous opacities. Visualized abdomen is unremarkable. Multilevel degenerative changes of the thoracic spine. IMPRESSION: RIGHT greater than LEFT peripheral predominant heterogeneous opacities, may represent multifocal infection, including COVID-19 pneumonia. Followup PA and lateral chest X-ray is recommended in 3-4 weeks after appropriate treatment to ensure resolution and exclude underlying malignancy. Electronically Signed   By: Valentino Saxon MD   On: 06/27/2020 14:57    EKG EKG Interpretation  Date/Time:  Saturday June 27 2020 17:18:44 EST Ventricular Rate:  72 PR Interval:    QRS Duration: 147 QT Interval:  445 QTC Calculation: 487 R Axis:   11 Text Interpretation: Electronic ventricular pacemaker Confirmed by Lajean Saver [phone]) on 06/27/2020 5:34:01 PM   Radiology DG Chest Portable 1 View  Result Date: 06/27/2020 CLINICAL DATA:  Cough EXAM: PORTABLE CHEST 1 VIEW COMPARISON:  December 05, 2019 FINDINGS: The cardiomediastinal silhouette is unchanged in contour. Status post TAVR. LEFT chest AICD. No pleural effusion. No pneumothorax. There are RIGHT greater than LEFT peripheral predominant heterogeneous opacities. Visualized abdomen is unremarkable. Multilevel degenerative changes of the thoracic spine. IMPRESSION: RIGHT greater than LEFT peripheral predominant heterogeneous opacities, may represent multifocal infection, including COVID-19 pneumonia. Followup PA and lateral chest X-ray is recommended in 3-4 weeks after appropriate treatment to ensure resolution and exclude underlying malignancy. Electronically Signed   By: Valentino Saxon MD   On: 06/27/2020 14:57    Procedures Procedures (including critical care time)  Medications Ordered in ED Medications  sodium chloride 0.9 % bolus 500 mL (has no administration in time range)    ED Course  I have reviewed the triage vital signs and the nursing notes.  Pertinent labs & imaging results that were available  during my care of the patient were reviewed by me and considered in my medical decision making (see chart for details).    MDM Rules/Calculators/A&P                          CXR.  Reviewed nursing notes and prior charts for additional history. Recent ua and urine culture positive - sensitive to abx therapy.  CXR reviewed/interpreted by me - bil infiltrate. ?viral. Additional labs and covid swab added to workup.   Labs reviewed/interpreted by me - covid test positive.   [name] [name] was evaluated in Emergency Department on 06/27/2020 for the symptoms described in the history of present illness. She was evaluated in the context of the global COVID-19 pandemic, which necessitated consideration that the patient might be at risk for infection with the SARS-CoV-2 virus that causes COVID-19. Institutional protocols and algorithms that pertain to the evaluation of patients at risk for COVID-19 are in a state of rapid change based  on information released by regulatory bodies including the CDC and federal and state organizations. These policies and algorithms were followed during the patient's care in the ED.  MAb infusion therapy provided. Pt agreeable.   Patient is tolerating po, appears adequately hydrated, is not hypoxic, and is breathing comfortably.  Return precautions provided.     Final Clinical Impression(s) / ED Diagnoses Final diagnoses:  None    Rx / DC Orders ED Discharge Orders    None       Lajean Saver, MD 06/28/20 1535

## 2020-06-28 ENCOUNTER — Telehealth: Payer: Self-pay | Admitting: *Deleted

## 2020-06-28 NOTE — Telephone Encounter (Signed)
Post ED Visit - Positive Culture Follow-up  Culture report reviewed by antimicrobial stewardship pharmacist: [place] Team []  Elenor Quinones, Pharm.D. []  Heide Guile, Pharm.D., BCPS AQ-ID []  Parks Neptune, Pharm.D., BCPS []  Alycia Rossetti, Pharm.D., BCPS []  Rudy, Pharm.D., BCPS, AAHIVP []  Legrand Como, Pharm.D., BCPS, AAHIVP [x]  Salome Arnt, PharmD, BCPS []  Johnnette Gourd, PharmD, BCPS []  Hughes Better, PharmD, BCPS []  Leeroy Cha, PharmD []  Laqueta Linden, PharmD, BCPS []  Albertina Parr, PharmD  [place] Team []  Leodis Sias, PharmD []  Lindell Spar, PharmD []  Royetta Asal, PharmD []  Graylin Shiver, Rph []  Rema Fendt) Glennon Mac, PharmD []  Arlyn Dunning, PharmD []  Netta Cedars, PharmD []  Dia Sitter, PharmD []  Leone Haven, PharmD []  Gretta Arab, PharmD []  Theodis Shove, PharmD []  Peggyann Juba, PharmD []  Reuel Boom, PharmD   Positive urine culture Treated with Cephalexin, organism sensitive to the same and no further patient follow-up is required at this time.  Harlon Flor [facility] 06/28/2020, 11:01 AM

## 2020-06-29 ENCOUNTER — Telehealth: Payer: Self-pay

## 2020-06-29 ENCOUNTER — Inpatient Hospital Stay (HOSPITAL_COMMUNITY)
Admission: EM | Admit: 2020-06-29 | Discharge: 2020-07-04 | DRG: 177 | Disposition: A | Discharge status: Home Health | Payer: PPO | Attending: Internal Medicine | Admitting: Internal Medicine

## 2020-06-29 ENCOUNTER — Encounter (HOSPITAL_COMMUNITY): Payer: Self-pay | Admitting: Pharmacy Technician

## 2020-06-29 ENCOUNTER — Emergency Department (HOSPITAL_COMMUNITY): Payer: PPO

## 2020-06-29 ENCOUNTER — Other Ambulatory Visit: Payer: Self-pay

## 2020-06-29 DIAGNOSIS — Z7982 Long term (current) use of aspirin: Secondary | ICD-10-CM | POA: Diagnosis not present

## 2020-06-29 DIAGNOSIS — Z7952 Long term (current) use of systemic steroids: Secondary | ICD-10-CM | POA: Diagnosis not present

## 2020-06-29 DIAGNOSIS — Z79899 Other long term (current) drug therapy: Secondary | ICD-10-CM

## 2020-06-29 DIAGNOSIS — E1121 Type 2 diabetes mellitus with diabetic nephropathy: Secondary | ICD-10-CM | POA: Diagnosis present

## 2020-06-29 DIAGNOSIS — E1122 Type 2 diabetes mellitus with diabetic chronic kidney disease: Secondary | ICD-10-CM | POA: Diagnosis present

## 2020-06-29 DIAGNOSIS — Z8249 Family history of ischemic heart disease and other diseases of the circulatory system: Secondary | ICD-10-CM | POA: Diagnosis not present

## 2020-06-29 DIAGNOSIS — I5042 Chronic combined systolic (congestive) and diastolic (congestive) heart failure: Secondary | ICD-10-CM | POA: Diagnosis present

## 2020-06-29 DIAGNOSIS — U071 COVID-19: Secondary | ICD-10-CM

## 2020-06-29 DIAGNOSIS — Z880 Allergy status to penicillin: Secondary | ICD-10-CM | POA: Diagnosis not present

## 2020-06-29 DIAGNOSIS — J189 Pneumonia, unspecified organism: Secondary | ICD-10-CM | POA: Diagnosis not present

## 2020-06-29 DIAGNOSIS — E785 Hyperlipidemia, unspecified: Secondary | ICD-10-CM | POA: Diagnosis present

## 2020-06-29 DIAGNOSIS — Z888 Allergy status to other drugs, medicaments and biological substances status: Secondary | ICD-10-CM | POA: Diagnosis not present

## 2020-06-29 DIAGNOSIS — N184 Chronic kidney disease, stage 4 (severe): Secondary | ICD-10-CM | POA: Diagnosis present

## 2020-06-29 DIAGNOSIS — J1282 Pneumonia due to coronavirus disease 2019: Secondary | ICD-10-CM | POA: Diagnosis present

## 2020-06-29 DIAGNOSIS — J9601 Acute respiratory failure with hypoxia: Secondary | ICD-10-CM | POA: Diagnosis present

## 2020-06-29 DIAGNOSIS — Z95810 Presence of automatic (implantable) cardiac defibrillator: Secondary | ICD-10-CM

## 2020-06-29 DIAGNOSIS — R0902 Hypoxemia: Secondary | ICD-10-CM | POA: Diagnosis present

## 2020-06-29 DIAGNOSIS — Z8349 Family history of other endocrine, nutritional and metabolic diseases: Secondary | ICD-10-CM | POA: Diagnosis not present

## 2020-06-29 DIAGNOSIS — E119 Type 2 diabetes mellitus without complications: Secondary | ICD-10-CM | POA: Diagnosis present

## 2020-06-29 DIAGNOSIS — E1169 Type 2 diabetes mellitus with other specified complication: Secondary | ICD-10-CM | POA: Diagnosis present

## 2020-06-29 DIAGNOSIS — R059 Cough, unspecified: Secondary | ICD-10-CM | POA: Diagnosis not present

## 2020-06-29 DIAGNOSIS — I130 Hypertensive heart and chronic kidney disease with heart failure and stage 1 through stage 4 chronic kidney disease, or unspecified chronic kidney disease: Secondary | ICD-10-CM | POA: Diagnosis present

## 2020-06-29 DIAGNOSIS — Z794 Long term (current) use of insulin: Secondary | ICD-10-CM

## 2020-06-29 DIAGNOSIS — I428 Other cardiomyopathies: Secondary | ICD-10-CM | POA: Diagnosis present

## 2020-06-29 DIAGNOSIS — R0602 Shortness of breath: Secondary | ICD-10-CM | POA: Diagnosis not present

## 2020-06-29 DIAGNOSIS — R531 Weakness: Secondary | ICD-10-CM | POA: Diagnosis not present

## 2020-06-29 DIAGNOSIS — Z952 Presence of prosthetic heart valve: Secondary | ICD-10-CM | POA: Diagnosis not present

## 2020-06-29 DIAGNOSIS — R5381 Other malaise: Secondary | ICD-10-CM | POA: Diagnosis not present

## 2020-06-29 DIAGNOSIS — Z885 Allergy status to narcotic agent status: Secondary | ICD-10-CM | POA: Diagnosis not present

## 2020-06-29 DIAGNOSIS — I152 Hypertension secondary to endocrine disorders: Secondary | ICD-10-CM | POA: Diagnosis present

## 2020-06-29 HISTORY — DX: Acute respiratory failure with hypoxia: J96.01

## 2020-06-29 HISTORY — DX: COVID-19: U07.1

## 2020-06-29 LAB — COMPREHENSIVE METABOLIC PANEL
ALT: 24 U/L (ref 0–44)
AST: 43 U/L — ABNORMAL HIGH (ref 15–41)
Albumin: 2.9 g/dL — ABNORMAL LOW (ref 3.5–5.0)
Alkaline Phosphatase: 32 U/L — ABNORMAL LOW (ref 38–126)
Anion gap: 11 (ref 5–15)
BUN: 46 mg/dL — ABNORMAL HIGH (ref 8–23)
CO2: 24 mmol/L (ref 22–32)
Calcium: 9 mg/dL (ref 8.9–10.3)
Chloride: 106 mmol/L (ref 98–111)
Creatinine, Ser: 1.89 mg/dL — ABNORMAL HIGH (ref 0.44–1.00)
GFR, Estimated: 27 mL/min — ABNORMAL LOW (ref >60)
Glucose, Bld: 158 mg/dL — ABNORMAL HIGH (ref 70–99)
Potassium: 4.5 mmol/L (ref 3.5–5.1)
Sodium: 141 mmol/L (ref 135–145)
Total Bilirubin: 0.8 mg/dL (ref 0.3–1.2)
Total Protein: 6.8 g/dL (ref 6.5–8.1)

## 2020-06-29 LAB — CBC WITH DIFFERENTIAL/PLATELET
Abs Immature Granulocytes: 0.08 10*3/uL — ABNORMAL HIGH (ref 0.00–0.07)
Basophils Absolute: 0 10*3/uL (ref 0.0–0.1)
Basophils Relative: 0 %
Eosinophils Absolute: 0 10*3/uL (ref 0.0–0.5)
Eosinophils Relative: 1 %
HCT: 41.9 % (ref 36.0–46.0)
Hemoglobin: 13.5 g/dL (ref 12.0–15.0)
Immature Granulocytes: 2 %
Lymphocytes Relative: 20 %
Lymphs Abs: 1 10*3/uL (ref 0.7–4.0)
MCH: 27.9 pg (ref 26.0–34.0)
MCHC: 32.2 g/dL (ref 30.0–36.0)
MCV: 86.6 fL (ref 80.0–100.0)
Monocytes Absolute: 0.4 10*3/uL (ref 0.1–1.0)
Monocytes Relative: 8 %
Neutro Abs: 3.6 10*3/uL (ref 1.7–7.7)
Neutrophils Relative %: 69 %
Platelets: 271 10*3/uL (ref 150–400)
RBC: 4.84 MIL/uL (ref 3.87–5.11)
RDW: 14.9 % (ref 11.5–15.5)
WBC: 5.2 10*3/uL (ref 4.0–10.5)
nRBC: 0 % (ref 0.0–0.2)

## 2020-06-29 LAB — PROCALCITONIN: Procalcitonin: 0.24 ng/mL

## 2020-06-29 LAB — LACTIC ACID, PLASMA
Lactic Acid, Venous: 1.6 mmol/L (ref 0.5–1.9)
Lactic Acid, Venous: 2 mmol/L (ref 0.5–1.9)

## 2020-06-29 LAB — FERRITIN: Ferritin: 482 ng/mL — ABNORMAL HIGH (ref 11–307)

## 2020-06-29 LAB — FIBRINOGEN: Fibrinogen: 528 mg/dL — ABNORMAL HIGH (ref 210–475)

## 2020-06-29 LAB — C-REACTIVE PROTEIN: CRP: 5.2 mg/dL — ABNORMAL HIGH (ref <1.0)

## 2020-06-29 LAB — TRIGLYCERIDES: Triglycerides: 209 mg/dL — ABNORMAL HIGH (ref <150)

## 2020-06-29 LAB — D-DIMER, QUANTITATIVE: D-Dimer, Quant: 0.68 ug/mL-FEU — ABNORMAL HIGH (ref 0.00–0.50)

## 2020-06-29 LAB — LACTATE DEHYDROGENASE: LDH: 385 U/L — ABNORMAL HIGH (ref 98–192)

## 2020-06-29 MED ORDER — ASCORBIC ACID 500 MG PO TABS
500.0000 mg | ORAL_TABLET | Freq: Every day | ORAL | Status: DC
  Administered 2020-06-30 – 2020-07-04 (×5): 500 mg via ORAL
  Filled 2020-06-29 (×5): qty 1

## 2020-06-29 MED ORDER — HYDROCOD POLST-CPM POLST ER 10-8 MG/5ML PO SUER: 5.0000 mL | Freq: Two times a day (BID) | ORAL | Status: DC | PRN

## 2020-06-29 MED ORDER — INSULIN ASPART 100 UNIT/ML ~~LOC~~ SOLN
0.0000 [IU] | Freq: Every day | SUBCUTANEOUS | Status: DC
  Administered 2020-06-30 – 2020-07-01 (×3): 2 [IU] via SUBCUTANEOUS
  Administered 2020-07-02: 3 [IU] via SUBCUTANEOUS

## 2020-06-29 MED ORDER — ACETAMINOPHEN 325 MG PO TABS: 650.0000 mg | ORAL_TABLET | Freq: Four times a day (QID) | ORAL | Status: DC | PRN

## 2020-06-29 MED ORDER — ONDANSETRON HCL 4 MG/2ML IJ SOLN: 4.0000 mg | Freq: Four times a day (QID) | INTRAMUSCULAR | Status: DC | PRN

## 2020-06-29 MED ORDER — METHYLPREDNISOLONE SODIUM SUCC 40 MG IJ SOLR
40.0000 mg | Freq: Two times a day (BID) | INTRAMUSCULAR | Status: DC
  Administered 2020-06-30 – 2020-07-04 (×10): 40 mg via INTRAVENOUS
  Filled 2020-06-29 (×10): qty 1

## 2020-06-29 MED ORDER — SODIUM CHLORIDE 0.9 % IV SOLN
200.0000 mg | Freq: Once | INTRAVENOUS | Status: AC
  Administered 2020-06-30: 200 mg via INTRAVENOUS
  Filled 2020-06-29: qty 40

## 2020-06-29 MED ORDER — ONDANSETRON HCL 4 MG PO TABS: 4.0000 mg | ORAL_TABLET | Freq: Four times a day (QID) | ORAL | Status: DC | PRN

## 2020-06-29 MED ORDER — ZINC SULFATE 220 (50 ZN) MG PO CAPS
220.0000 mg | ORAL_CAPSULE | Freq: Every day | ORAL | Status: DC
  Administered 2020-06-30 – 2020-07-04 (×5): 220 mg via ORAL
  Filled 2020-06-29 (×5): qty 1

## 2020-06-29 MED ORDER — SODIUM CHLORIDE 0.9 % IV SOLN
100.0000 mg | Freq: Every day | INTRAVENOUS | Status: AC
  Administered 2020-07-01 – 2020-07-04 (×4): 100 mg via INTRAVENOUS
  Filled 2020-06-29 (×4): qty 20

## 2020-06-29 MED ORDER — SODIUM CHLORIDE 0.9 % IV BOLUS
250.0000 mL | Freq: Once | INTRAVENOUS | Status: AC
  Administered 2020-06-30: 250 mL via INTRAVENOUS

## 2020-06-29 MED ORDER — HEPARIN SODIUM (PORCINE) 5000 UNIT/ML IJ SOLN
5000.0000 [IU] | Freq: Three times a day (TID) | INTRAMUSCULAR | Status: DC
  Administered 2020-06-30 – 2020-07-04 (×14): 5000 [IU] via SUBCUTANEOUS
  Filled 2020-06-29 (×14): qty 1

## 2020-06-29 MED ORDER — EZETIMIBE 10 MG PO TABS
10.0000 mg | ORAL_TABLET | Freq: Every day | ORAL | Status: DC
  Administered 2020-06-30 – 2020-07-04 (×5): 10 mg via ORAL
  Filled 2020-06-29 (×5): qty 1

## 2020-06-29 MED ORDER — CARVEDILOL 25 MG PO TABS
25.0000 mg | ORAL_TABLET | Freq: Two times a day (BID) | ORAL | Status: DC
  Filled 2020-06-29: qty 1

## 2020-06-29 MED ORDER — SODIUM CHLORIDE 0.9% FLUSH
3.0000 mL | Freq: Two times a day (BID) | INTRAVENOUS | Status: DC
  Administered 2020-06-30 – 2020-07-04 (×10): 3 mL via INTRAVENOUS

## 2020-06-29 MED ORDER — ALBUTEROL SULFATE HFA 108 (90 BASE) MCG/ACT IN AERS: 2.0000 | INHALATION_SPRAY | Freq: Four times a day (QID) | RESPIRATORY_TRACT | Status: DC | PRN

## 2020-06-29 MED ORDER — INSULIN ASPART 100 UNIT/ML ~~LOC~~ SOLN
0.0000 [IU] | Freq: Three times a day (TID) | SUBCUTANEOUS | Status: DC
  Administered 2020-06-30 – 2020-07-01 (×4): 3 [IU] via SUBCUTANEOUS
  Administered 2020-07-01: 2 [IU] via SUBCUTANEOUS
  Administered 2020-07-01: 7 [IU] via SUBCUTANEOUS
  Administered 2020-07-02: 3 [IU] via SUBCUTANEOUS
  Administered 2020-07-02 – 2020-07-03 (×3): 5 [IU] via SUBCUTANEOUS
  Administered 2020-07-03: 1 [IU] via SUBCUTANEOUS
  Administered 2020-07-03 – 2020-07-04 (×2): 3 [IU] via SUBCUTANEOUS
  Administered 2020-07-04: 5 [IU] via SUBCUTANEOUS

## 2020-06-29 MED ORDER — PRAVASTATIN SODIUM 40 MG PO TABS
40.0000 mg | ORAL_TABLET | Freq: Every day | ORAL | Status: DC
  Administered 2020-06-30 – 2020-07-03 (×4): 40 mg via ORAL
  Filled 2020-06-29 (×4): qty 1

## 2020-06-29 MED ORDER — GUAIFENESIN-DM 100-10 MG/5ML PO SYRP: 10.0000 mL | ORAL_SOLUTION | ORAL | Status: DC | PRN

## 2020-06-29 MED ORDER — ASPIRIN EC 81 MG PO TBEC
81.0000 mg | DELAYED_RELEASE_TABLET | Freq: Every morning | ORAL | Status: DC
  Administered 2020-06-30 – 2020-07-04 (×5): 81 mg via ORAL
  Filled 2020-06-29 (×5): qty 1

## 2020-06-29 NOTE — ED Notes (Signed)
Pt placed on 2L [place] with improvement to 93%

## 2020-06-29 NOTE — Telephone Encounter (Signed)
Noted, thank you, I agree

## 2020-06-29 NOTE — Telephone Encounter (Signed)
FYI  Patient called stating she had been to the ED twice over the weekend. She stated she was just so weak that she just couldn't take care of herself. I reviewed her chart and advised her that she had Covid. She stated yes that is what they told her. I told her if she was that weak, she needed to go back to the ED. I asked her if she had a life alert and she stated no. She said she didn't have that kind of money and would call her son to come and take her. Spoke with family and they stated they would take her to the ED for evaluation.

## 2020-06-29 NOTE — ED Triage Notes (Signed)
Pt bib ems covid X5 days with reports of diarrhea, shob, cough. Pt  97% RA. CBG 157. Other VSS.

## 2020-06-29 NOTE — ED Notes (Signed)
[place]

## 2020-06-29 NOTE — ED Provider Notes (Signed)
[facility] EMERGENCY DEPARTMENT Provider Note   CSN: [id number] Arrival date & time: 06/29/20  1127     History Chief Complaint  Patient presents with  . Covid Positive    [name] [name] is a 74 y.o. female.  The history is provided by the patient and medical records. No language interpreter was used.  Shortness of Breath Severity:  Severe Onset quality:  Gradual Duration:  4 days Timing:  Constant Progression:  Worsening Chronicity:  New Context: URI   Context: not activity   Relieved by:  Nothing Worsened by:  Coughing Ineffective treatments:  None tried Associated symptoms: cough and sputum production   Associated symptoms: no abdominal pain, no chest pain, no diaphoresis, no fever, no headaches, no neck pain, no rash, no vomiting and no wheezing        Past Medical History:  Diagnosis Date  . Anxiety   . Aortic stenosis   . Arthritis   . Biventricular ICD (implantable cardioverter-defibrillator) in place    March 2021, [employer]  -[name]  . Chronic combined systolic and diastolic CHF (congestive heart failure) ([place])   . CKD (chronic kidney disease) stage 3, GFR 30-59 ml/min (HCC)   . Constipation   . Depression   . Diabetes mellitus, type 2 ([place])   . Essential hypertension   . Hyperlipidemia   . Incidental pulmonary nodule, > 686m and < 858m   7 x 4 mm LLL nodule  . LBBB (left bundle branch block)   . Nonischemic cardiomyopathy (HCC)    No significant obstructive CAD at heart catheterization 05/2014, LVEF 20%  . Obesity   . Pneumonia 2013  . 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 Symptomatic PVCs     Patient Active Problem List   Diagnosis Date Noted  . Generalized osteoarthritis 07/26/2019  . Osteoarthritis of left knee 03/16/2017  . 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 Severe  aortic valve stenosis 03/24/2015  . Hyperlipidemia LDL goal <100 05/24/2014  . Severe aortic stenosis 05/23/2014  . Incidental pulmonary nodule, > 86m84mnd < 8mm67m/29/2015  . Chronic combined systolic and diastolic CHF (congestive heart failure) (HCC)[place]. LBBB (left bundle branch block) 12/03/2013  . 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 Seasonal allergies 02/22/2012  . Essential hypertension, benign   . Nonischemic cardiomyopathy (HCC)[place]/08/2011  . 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 Overweight (BMI 25.0-29.9) 10/24/2007    Past Surgical History:  Procedure Laterality Date  . BIV ICD INSERTION CRT-D N/A 10/28/2019   Procedure: BIV ICD INSERTION CRT-D;  Surgeon: TaylEvans Lance;  [facility];  Service: Cardiovascular;  Laterality: N/A;  . CARDIAC CATHETERIZATION    . CESAREAN SECTION  1987  . FLEXIBLE BRONCHOSCOPY N/A 01/14/2015   Procedure: FLEXIBLE BRONCHOSCOPY;  Surgeon: StevMelrose Nakayama;  [facility];  Laterality: N/A;  . LEFT AND RIGHT HEART CATHETERIZATION WITH CORONARY ANGIOGRAM N/A 12/05/2011   Procedure: LEFT AND RIGHT HEART CATHETERIZATION WITH CORONARY ANGIOGRAM;  Surgeon: ChriBurnell Blanks;  [facility];  Service: Cardiovascular;  Laterality: N/A;  . LEFT AND RIGHT HEART CATHETERIZATION WITH CORONARY ANGIOGRAM N/A 05/23/2014   Procedure: LEFT AND RIGHT HEART CATHETERIZATION WITH CORONARY ANGIOGRAM;  Surgeon: ChriBurnell Blanks;  Location: [facility] CATH LAB;  Service: Cardiovascular;  Laterality: N/A;  . MULTIPLE EXTRACTIONS WITH ALVEOLOPLASTY N/A 02/27/2014   Procedure: Extraction of tooth #'s 2,4,5,6,7,8,9,10,11,12, 22, 23, 24, 25, 26, 28 with alveoloplasty and bilateral mandibular tori reductions;  Surgeon: [name], DDS;  Location: [facility];  Service: Oral Surgery;  Laterality: N/A;  . RIGID BRONCHOSCOPY N/A 01/14/2015   Procedure: RIGID BRONCHOSCOPY  with Removal Of Foreign Body ;  Surgeon: [name], MD;  Location: [facility];  Service: Thoracic;  Laterality: N/A;  . TEE WITHOUT CARDIOVERSION N/A 03/24/2015   Procedure: TRANSESOPHAGEAL ECHOCARDIOGRAM (TEE);  Surgeon: [name], MD;  Location: [facility];  Service: Open Heart Surgery;  Laterality: N/A;  . TRANSCATHETER AORTIC VALVE REPLACEMENT, TRANSFEMORAL Bilateral 03/24/2015   Procedure: TRANSCATHETER AORTIC VALVE REPLACEMENT, TRANSFEMORAL;  Surgeon: [name], MD;  Location: [facility];  Service: Open Heart Surgery;  Laterality: Bilateral;  . TUBAL LIGATION  1987  . VIDEO BRONCHOSCOPY Bilateral 06/05/2014   Procedure: VIDEO BRONCHOSCOPY WITHOUT FLUORO;  Surgeon: [name], MD;  [facility];  Service: Cardiopulmonary;  Laterality: Bilateral;     OB History   No obstetric history on file.     Family History  Problem Relation Age of Onset  . Heart disease Mother   . Heart attack Mother 44  . Diabetes Mother   . Hypertension Mother   . Colon cancer Father 29  . Breast cancer Sister   . Diabetes Brother   . Hypertension Brother   . Stroke Brother   . Cancer Brother   . Congestive Heart Failure Daughter   . Hypertension Son   . High Cholesterol Son   . Congestive Heart Failure Son     Social History   Tobacco Use  . Smoking status: Never Smoker  . Smokeless tobacco: Never Used  Vaping Use  . Vaping Use: Never used  Substance Use Topics  . Alcohol use: No  . Drug use: No    Home Medications Prior to Admission medications   Medication Sig Start Date End Date Taking? Authorizing Provider  acetaminophen (TYLENOL) 500 MG tablet Take one tablet two times daily, by mouth, for knee  pain Patient taking differently: Take 500 mg by mouth 2 (two) times daily as needed for moderate pain. Take one tablet two times daily, by mouth, for knee  pain 07/18/19   Fayrene Helper, MD  aspirin EC 81 MG tablet Take 81 mg by mouth every morning.      [provider]  blood glucose meter kit and supplies Dispense based on patient and insurance preference. Use to test three times daily (FOR ICD-10 E10.9, E11.9). 05/07/19   Fayrene Helper, MD  carvedilol (COREG) 25 MG tablet Take 1 tablet (25 mg total) by mouth 2 (two) times daily with a meal. 05/01/20   [name], MD  cephALEXin (KEFLEX) 500 MG capsule Take 1 capsule (500 mg total) by mouth 2 (two) times daily for 7 days. 06/24/20 07/01/20  Sherwood Gambler, MD  colchicine 0.6 MG tablet Take 1 tablet (0.6 mg total) by mouth 2 (two) times daily. Take with food 12/16/19   Carole Civil, MD  diclofenac Sodium (VOLTAREN) 1 % GEL Apply 1 application topically 4 (four) times daily as needed (pain).    [provider]  ezetimibe (ZETIA) 10 MG tablet TAKE ONE TABLET BY MOUTH ONCE DAILY. 04/16/20   Fayrene Helper, MD  fenofibrate (TRICOR) 145 MG tablet TAKE (1) TABLET  BY MOUTH ONCE DAILY. 06/17/20   Fayrene Helper, MD  furosemide (LASIX) 40 MG tablet TAKE (1) TABLET BY MOUTH EACH MORNING. 05/01/20   Fayrene Helper, MD  glipiZIDE (GLUCOTROL XL) 2.5 MG 24 hr tablet Take 1 tablet (2.5 mg total) by mouth daily with breakfast. 01/28/20   Fayrene Helper, MD  Insulin Pen Needle (LITETOUCH PEN NEEDLES) 31G X 8 MM MISC USE AS NEEDED TO INJECT INSULIN. 07/18/19   Fayrene Helper, MD  Lancets [facility] DELICA PLUS ZOXWRU04V) MISC USE 3 TIMES A DAY AS DIRECTED. 10/01/19   Fayrene Helper, MD  LIVALO 2 MG TABS TAKE ONE TABLET BY MOUTH ONCE DAILY AFTER SUPPER. 05/21/20   Perlie Mayo, NP  Multiple Vitamins-Minerals (MULTIVITAMIN PO) Take 1 tablet by mouth every morning.     [provider]  NOVOLOG MIX 70/30 FLEXPEN (70-30) 100 UNIT/ML FlexPen INJECT 12 TO 15 UNITS SUBCUTANEOUSLY TWICE DAILY. 06/17/20   Fayrene Helper, MD  ONETOUCH ULTRA test strip CHECK BLOOD SUGAR 3 TIMES A DAY. 11/14/19   Fayrene Helper, MD  potassium chloride (KLOR-CON) 10 MEQ  tablet TAKE ONE TABLET BY MOUTH DAILY. DO NOT TAKE IF NOT TAKING FUROSEMIDE. 06/17/20   Fayrene Helper, MD  predniSONE (DELTASONE) 10 MG tablet Take 1 tablet (10 mg total) by mouth 2 (two) times daily with a meal. 05/11/20   Fayrene Helper, MD  spironolactone (ALDACTONE) 25 MG tablet TAKE ONE TABLET BY MOUTH ONCE DAILY. 01/09/20   Fayrene Helper, MD  UNABLE TO FIND Shower bench x 1   DX W09.81 05/02/19   Fayrene Helper, MD  UNABLE TO FIND Rolling walker with seat x 1  DX m17.12 05/02/19   Fayrene Helper, MD  cetirizine (ZYRTEC) 10 MG tablet Take 1 tablet (10 mg total) by mouth daily. 06/30/11 04/19/18  Fayrene Helper, MD  ferrous sulfate 325 (65 FE) MG EC tablet Take 1 tablet (325 mg total) by mouth 2 (two) times daily. 07/14/11 10/28/11  Fayrene Helper, MD  FLUoxetine (PROZAC) 10 MG tablet Take 1 tablet (10 mg total) by mouth daily. Take one capsule by mouth once a day 12/20/10 11/03/11  Fayrene Helper, MD    Allergies    Crestor [rosuvastatin], Tramadol, and Penicillins  Review of Systems   Review of Systems  Constitutional: Positive for chills and fatigue. Negative for diaphoresis and fever.  HENT: Negative for congestion.   Eyes: Negative for visual disturbance.  Respiratory: Positive for cough, sputum production, chest tightness and shortness of breath. Negative for choking, wheezing and stridor.   Cardiovascular: Negative for chest pain, palpitations and leg swelling.  Gastrointestinal: Positive for diarrhea. Negative for abdominal pain, constipation, nausea and vomiting.  Genitourinary: Negative for dysuria and flank pain.  Musculoskeletal: Negative for back pain, neck pain and neck stiffness.  Skin: Negative for rash and wound.  Neurological: Negative for dizziness, light-headedness and headaches.  Psychiatric/Behavioral: Negative for agitation.  All other systems reviewed and are negative.   Physical Exam Updated Vital Signs BP 120/77 (BP  Location: Right Arm)   Pulse 79   Temp 98.2 F (36.8 C) (Oral)   Resp 20   SpO2 97%   Physical Exam Vitals and nursing note reviewed.  Constitutional:      [name]: She is not in acute distress.    Appearance: She is well-developed. She is not ill-appearing, toxic-appearing or diaphoretic.  HENT:     Head: Normocephalic and atraumatic.  Right Ear: External ear normal.     Left Ear: External ear normal.     Nose: Nose normal. No congestion or rhinorrhea.     Mouth/Throat:     Mouth: Mucous membranes are dry.     Pharynx: No oropharyngeal exudate or posterior oropharyngeal erythema.  Eyes:     Extraocular Movements: Extraocular movements intact.     Conjunctiva/sclera: Conjunctivae normal.     Pupils: Pupils are equal, round, and reactive to light.  Cardiovascular:     Rate and Rhythm: Normal rate.     Pulses: Normal pulses.     Heart sounds: Murmur heard.   Pulmonary:     Effort: No respiratory distress.     Breath sounds: No stridor. Rhonchi present. No wheezing or rales.  Chest:     Chest wall: No tenderness.  Abdominal:     [name]: Abdomen is flat. There is no distension.     Tenderness: There is no abdominal tenderness. There is no guarding or rebound.  Musculoskeletal:        [name]: No tenderness.     Cervical back: Normal range of motion and neck supple. No tenderness.     Right lower leg: No edema.     Left lower leg: No edema.  Skin:    [name]: Skin is warm.     Capillary Refill: Capillary refill takes less than 2 seconds.     Coloration: Skin is not pale.     Findings: No erythema or rash.  Neurological:     [name]: No focal deficit present.     Mental Status: She is alert.     Motor: No weakness or abnormal muscle tone.     Coordination: Coordination normal.     Deep Tendon Reflexes: Reflexes are normal and symmetric.  Psychiatric:        Mood and Affect: Mood normal.     ED Results / Procedures / Treatments   Labs (all labs ordered are  listed, but only abnormal results are displayed) Labs Reviewed  LACTIC ACID, PLASMA - Abnormal; Notable for the following components:      Result Value   Lactic Acid, Venous 2.0 (*)    All other components within normal limits  COMPREHENSIVE METABOLIC PANEL - Abnormal; Notable for the following components:   Glucose, Bld 158 (*)    BUN 46 (*)    Creatinine, Ser 1.89 (*)    Albumin 2.9 (*)    AST 43 (*)    Alkaline Phosphatase 32 (*)    GFR, Estimated 27 (*)    All other components within normal limits  CBC WITH DIFFERENTIAL/PLATELET - Abnormal; Notable for the following components:   Abs Immature Granulocytes 0.08 (*)    All other components within normal limits  D-DIMER, QUANTITATIVE (NOT AT [facility]) - Abnormal; Notable for the following components:   D-Dimer, Quant 0.68 (*)    All other components within normal limits  LACTATE DEHYDROGENASE - Abnormal; Notable for the following components:   LDH 385 (*)    All other components within normal limits  FERRITIN - Abnormal; Notable for the following components:   Ferritin 482 (*)    All other components within normal limits  FIBRINOGEN - Abnormal; Notable for the following components:   Fibrinogen 528 (*)    All other components within normal limits  C-REACTIVE PROTEIN - Abnormal; Notable for the following components:   CRP 5.2 (*)    All other components within normal limits  TRIGLYCERIDES -  Abnormal; Notable for the following components:   Triglycerides 209 (*)    All other components within normal limits  CULTURE, BLOOD (ROUTINE X 2)  CULTURE, BLOOD (ROUTINE X 2)  LACTIC ACID, PLASMA  PROCALCITONIN  URINALYSIS, ROUTINE W REFLEX MICROSCOPIC    EKG None  Radiology DG Chest Portable 1 View  Result Date: 06/29/2020 CLINICAL DATA:  COVID-19 positive with increased shortness of breath and weakness. EXAM: PORTABLE CHEST 1 VIEW COMPARISON:  06/27/2020 FINDINGS: Lungs are hypoinflated with stable mild bilateral patchy  opacification likely viral pneumonia in this known COVID-19 positive patient. No evidence of effusion. Mild stable cardiomegaly. Remainder of the exam is unchanged. IMPRESSION: Stable bilateral patchy airspace process likely viral pneumonia in this known COVID-19 positive patient. Electronically Signed   By: Marin Olp M.D.   On: 06/29/2020 12:09    Procedures Procedures (including critical care time)  CRITICAL CARE Performed by: Gwenyth Allegra Miquel Stacks Total critical care time: 35 minutes Critical care time was exclusive of separately billable procedures and treating other patients. Critical care was necessary to treat or prevent imminent or life-threatening deterioration. Critical care was time spent personally by me on the following activities: development of treatment plan with patient and/or surrogate as well as nursing, discussions with consultants, evaluation of patient's response to treatment, examination of patient, obtaining history from patient or surrogate, ordering and performing treatments and interventions, ordering and review of laboratory studies, ordering and review of radiographic studies, pulse oximetry and re-evaluation of patient's condition.  [name] [name] was evaluated in Emergency Department on 06/29/2020 for the symptoms described in the history of present illness. She was evaluated in the context of the global COVID-19 pandemic, which necessitated consideration that the patient might be at risk for infection with the SARS-CoV-2 virus that causes COVID-19. Institutional protocols and algorithms that pertain to the evaluation of patients at risk for COVID-19 are in a state of rapid change based on information released by regulatory bodies including the CDC and federal and state organizations. These policies and algorithms were followed during the patient's care in the ED.  Medications Ordered in ED Medications - No data to display  ED Course  I have reviewed the triage  vital signs and the nursing notes.  Pertinent labs & imaging results that were available during my care of the patient were reviewed by me and considered in my medical decision making (see chart for details).    MDM Rules/Calculators/A&P                          BAYLEIGH LOFLIN is a 74 y.o. female with a past medical history significant for hypertension, CHF, diabetes, CKD, prior aortic stenosis status post TAVR, and recent diagnosis of COVID-19 status post Mab therapy who presents with worsening fatigue, malaise, cough, and shortness of breath.  Patient was found to have oxygen saturations in the 80s on arrival and is now on 3 L nasal cannula to maintain oxygen saturations in the 90s.  She is reporting no significant chest pain but does report a productive cough and shortness of breath that she says is worsening despite treatment.  She reports some diarrhea but denies nausea or vomiting.  Denies any true chest pain or abdominal pain.  She reports regular fatigue and [name] malaise.  She denies other complaints on arrival.  On my exam, patient does have some coarse breath sounds bilaterally and is tachypneic.  She is not tachycardic initially.  When  she was speaking to me, on 2 L, her oxygen saturation dropped to 90%, she was increased to 3 L with some improvement.  She had no chest tenderness or abdominal tenderness.  There was a slight systolic murmur.  Exam otherwise unremarkable.  Patient is very fatigued.  We will get screening labs and pre-Covid admission orders.  As she is now on oxygen and has been worsening with her comorbidities despite Mab and home monitoring, do not feel she is safe for discharge home at this time.  Anticipate admission after work-up is completed for new hypoxia in the setting of COVID-19 infection.  Work-up returned and her D-dimer is only slightly elevated but when age-adjusted is not elevated.  Other labs are consistent with Covid infection.  Due to her new oxygen  requirement, will call for admission.  Will defer the remdesivir and steroid orders for the admitting team.  Patient will be admitted for further management of COVID-19 with hypoxia.  Final Clinical Impression(s) / ED Diagnoses Final diagnoses:  Hypoxia  COVID-19    Clinical Impression: 1. Hypoxia   2. COVID-19     Disposition: Admit  This note was prepared with assistance of Dragon voice recognition software. Occasional wrong-word or sound-a-like substitutions may have occurred due to the inherent limitations of voice recognition software.     Ajit Errico, Gwenyth Allegra, MD 06/29/20 [phone]

## 2020-06-29 NOTE — ED Notes (Signed)
Pt. requesting something to eat or drink. Pt. Stated. Ive not had anything to eat or drink all day and Im a diabetic.

## 2020-06-29 NOTE — ED Notes (Signed)
Pt. Given Happy Meal with water.

## 2020-06-29 NOTE — H&P (Signed)
History and Physical    [name]:[id number] DOB: 12-14-1945 DOA: 06/29/2020  PCP: Fayrene Helper, MD  Patient coming from: Home via EMS  I have personally briefly reviewed patient's old medical records in [place]  Chief Complaint: Shortness of breath, cough, COVID-19 infection  HPI: [name] is a 74 y.o. 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  Patient was seen in the ED on 06/27/2020 for nonproductive cough and generalized weakness ongoing for 3-4 days. CXR showed bilateral infiltrates. Covid test was obtained and resulted positive. Patient was not hypoxic at that time. She did receive Regen-CoV monoclonal antibody infusion on the same day and discharged home.  Patient states she has been having continued progressive fatigue with dyspnea on exertion.  She has had cough intermittently productive of white sputum but largely nonproductive.  She has had diarrhea and decreased energy.  She says she has been eating fairly well when family has brought her food but says she has not had energy to cook for herself.  She denies any subjective fevers, chills, diaphoresis, chest pain, palpitations, nausea, vomiting, abdominal pain, dysuria, or lower extremity edema.  She says she has not received any of the COVID-19 vaccinations due to fear of potential side effect since she has an ICD in place.  ED Course:  Initial vitals showed BP 97/58, pulse 73, RR 21, temp 98.7 Fahrenheit, SPO2 reportedly in the 80s on room air. Patient was placed on 2 L of home O2 via [place] which was then increased to 3-3.5 L.  Labs show sodium 141, potassium 4.5, bicarb 24, BUN 46, creatinine 1.89, serum glucose 158, AST 43, ALT 24, alk phos 32, total bilirubin 0.8, WBC 5.2, hemoglobin 13.5,  platelets 271,000, lactic acid 1.6, D-dimer 0.68, procalcitonin 0.24, LDH 385, ferritin 42, CRP 5.2. Repeat lactic acid was 2.0. Blood cultures were obtained and pending.  Portable chest x-ray again showed bilateral patchy airspace disease.  The hospitalist service was consulted to admit for further evaluation and management.  Review of Systems: All systems reviewed and are negative except as documented in history of present illness above.   Past Medical History:  Diagnosis Date  . Anxiety   . Aortic stenosis   . Arthritis   . Biventricular ICD (implantable cardioverter-defibrillator) in place    March 2021, [employer]  -[name]  . Chronic combined systolic and diastolic CHF (congestive heart failure) ([place])   . CKD (chronic kidney disease) stage 3, GFR 30-59 ml/min (HCC)   . Constipation   . Depression   . Diabetes mellitus, type 2 ([place])   . Essential hypertension   . Hyperlipidemia   . Incidental pulmonary nodule, > 36m and < 845m   7 x 4 mm LLL nodule  . LBBB (left bundle branch block)   . Nonischemic cardiomyopathy (HCC)    No significant obstructive CAD at heart catheterization 05/2014, LVEF 20%  . Obesity   . Pneumonia 2013  . 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 Symptomatic PVCs     Past Surgical History:  Procedure Laterality Date  . BIV ICD INSERTION CRT-D N/A 10/28/2019   Procedure: BIV ICD INSERTION CRT-D;  Surgeon: Evans Lance, MD;  Location: [facility];  Service: Cardiovascular;  Laterality: N/A;  . CARDIAC CATHETERIZATION    . CESAREAN SECTION  1987  . FLEXIBLE BRONCHOSCOPY N/A 01/14/2015   Procedure: FLEXIBLE BRONCHOSCOPY;  Surgeon: [name], MD;  Location: [facility];  Service: Thoracic;  Laterality: N/A;  . LEFT AND RIGHT HEART CATHETERIZATION WITH CORONARY ANGIOGRAM N/A 12/05/2011   Procedure: LEFT AND RIGHT HEART  CATHETERIZATION WITH CORONARY ANGIOGRAM;  Surgeon: [name], MD;  Location: [facility] CATH LAB;  Service: Cardiovascular;  Laterality: N/A;  . LEFT AND RIGHT HEART CATHETERIZATION WITH CORONARY ANGIOGRAM N/A 05/23/2014   Procedure: LEFT AND RIGHT HEART CATHETERIZATION WITH CORONARY ANGIOGRAM;  Surgeon: [name], MD;  Location: [facility] CATH LAB;  Service: Cardiovascular;  Laterality: N/A;  . MULTIPLE EXTRACTIONS WITH ALVEOLOPLASTY N/A 02/27/2014   Procedure: Extraction of tooth #'s 2,4,5,6,7,8,9,10,11,12, 22, 23, 24, 25, 26, 28 with alveoloplasty and bilateral mandibular tori reductions;  Surgeon: [name], DDS;  Location: [place];  Service: Oral Surgery;  Laterality: N/A;  . RIGID BRONCHOSCOPY N/A 01/14/2015   Procedure: RIGID BRONCHOSCOPY with Removal Of Foreign Body ;  Surgeon: [name], MD;  Location: [place];  Service: Thoracic;  Laterality: N/A;  . TEE WITHOUT CARDIOVERSION N/A 03/24/2015   Procedure: TRANSESOPHAGEAL ECHOCARDIOGRAM (TEE);  Surgeon: [name], MD;  Location: [place];  Service: Open Heart Surgery;  Laterality: N/A;  . TRANSCATHETER AORTIC VALVE REPLACEMENT, TRANSFEMORAL Bilateral 03/24/2015   Procedure: TRANSCATHETER AORTIC VALVE REPLACEMENT, TRANSFEMORAL;  Surgeon: [name], MD;  Location: [place];  Service: Open Heart Surgery;  Laterality: Bilateral;  . TUBAL LIGATION  1987  . VIDEO BRONCHOSCOPY Bilateral 06/05/2014   Procedure: VIDEO BRONCHOSCOPY WITHOUT FLUORO;  Surgeon: [name], MD;  Location: [facility] ENDOSCOPY;  Service: Cardiopulmonary;  Laterality: Bilateral;    Social History:  reports that she has never smoked. She has never used smokeless tobacco. She reports that she does not drink alcohol and does not use drugs.  Allergies  Allergen Reactions  . Crestor [Rosuvastatin] Other (See Comments)    Muscle aches and elevated CK  . Tramadol Nausea Only    States blood sugar elevated also  . Penicillins Rash and  Other (See Comments)    Family unsure of reaction, but certain mom said she was allergic Did it involve swelling of the face/tongue/throat, SOB, or low BP? No Did it involve sudden or severe rash/hives, skin peeling, or any reaction on the inside of your mouth or nose? Yes Did you need to seek medical attention at a hospital or doctor's office? Yes When did it last happen?10 + years If all above answers are "NO", may proceed with cephalosporin use.     Family History  Problem Relation Age of Onset  . Heart disease Mother   . Heart attack Mother 55  . Diabetes Mother   . Hypertension Mother   . Colon cancer Father 18  . Breast cancer Sister   . Diabetes Brother   . Hypertension Brother   . Stroke Brother   . Cancer Brother   . Congestive Heart Failure Daughter   . Hypertension Son   . High Cholesterol Son   . Congestive Heart Failure Son      Prior to Admission medications   Medication Sig Start Date End Date Taking? Authorizing Provider  acetaminophen (TYLENOL) 500 MG tablet Take one tablet two times daily, by mouth, for knee  pain Patient taking differently:  Take 500 mg by mouth 2 (two) times daily as needed for moderate pain. Take one tablet two times daily, by mouth, for knee  pain 07/18/19   Fayrene Helper, MD  aspirin EC 81 MG tablet Take 81 mg by mouth every morning.     [provider]  blood glucose meter kit and supplies Dispense based on patient and insurance preference. Use to test three times daily (FOR ICD-10 E10.9, E11.9). 05/07/19   Fayrene Helper, MD  carvedilol (COREG) 25 MG tablet Take 1 tablet (25 mg total) by mouth 2 (two) times daily with a meal. 05/01/20   [name], MD  cephALEXin (KEFLEX) 500 MG capsule Take 1 capsule (500 mg total) by mouth 2 (two) times daily for 7 days. 06/24/20 07/01/20  Sherwood Gambler, MD  colchicine 0.6 MG tablet Take 1 tablet (0.6 mg total) by mouth 2 (two) times daily. Take with food 12/16/19    Carole Civil, MD  diclofenac Sodium (VOLTAREN) 1 % GEL Apply 1 application topically 4 (four) times daily as needed (pain).    [provider]  ezetimibe (ZETIA) 10 MG tablet TAKE ONE TABLET BY MOUTH ONCE DAILY. 04/16/20   Fayrene Helper, MD  fenofibrate (TRICOR) 145 MG tablet TAKE (1) TABLET BY MOUTH ONCE DAILY. 06/17/20   Fayrene Helper, MD  furosemide (LASIX) 40 MG tablet TAKE (1) TABLET BY MOUTH EACH MORNING. 05/01/20   Fayrene Helper, MD  glipiZIDE (GLUCOTROL XL) 2.5 MG 24 hr tablet Take 1 tablet (2.5 mg total) by mouth daily with breakfast. 01/28/20   Fayrene Helper, MD  Insulin Pen Needle (LITETOUCH PEN NEEDLES) 31G X 8 MM MISC USE AS NEEDED TO INJECT INSULIN. 07/18/19   Fayrene Helper, MD  Lancets [facility] DELICA PLUS [id number]) MISC USE 3 TIMES A DAY AS DIRECTED. 10/01/19   Fayrene Helper, MD  LIVALO 2 MG TABS TAKE ONE TABLET BY MOUTH ONCE DAILY AFTER SUPPER. 05/21/20   Perlie Mayo, NP  Multiple Vitamins-Minerals (MULTIVITAMIN PO) Take 1 tablet by mouth every morning.     [provider]  NOVOLOG MIX 70/30 FLEXPEN (70-30) 100 UNIT/ML FlexPen INJECT 12 TO 15 UNITS SUBCUTANEOUSLY TWICE DAILY. 06/17/20   Fayrene Helper, MD  ONETOUCH ULTRA test strip CHECK BLOOD SUGAR 3 TIMES A DAY. 11/14/19   Fayrene Helper, MD  potassium chloride (KLOR-CON) 10 MEQ tablet TAKE ONE TABLET BY MOUTH DAILY. DO NOT TAKE IF NOT TAKING FUROSEMIDE. 06/17/20   Fayrene Helper, MD  predniSONE (DELTASONE) 10 MG tablet Take 1 tablet (10 mg total) by mouth 2 (two) times daily with a meal. 05/11/20   Fayrene Helper, MD  spironolactone (ALDACTONE) 25 MG tablet TAKE ONE TABLET BY MOUTH ONCE DAILY. 01/09/20   Fayrene Helper, MD  UNABLE TO FIND Shower bench x 1   DX I43.32 05/02/19   Fayrene Helper, MD  UNABLE TO FIND Rolling walker with seat x 1  DX m17.12 05/02/19   Fayrene Helper, MD  cetirizine (ZYRTEC) 10 MG tablet Take 1 tablet (10 mg total)  by mouth daily. 06/30/11 04/19/18  Fayrene Helper, MD  ferrous sulfate 325 (65 FE) MG EC tablet Take 1 tablet (325 mg total) by mouth 2 (two) times daily. 07/14/11 10/28/11  Fayrene Helper, MD  FLUoxetine (PROZAC) 10 MG tablet Take 1 tablet (10 mg total) by mouth daily. Take one capsule by mouth once a day 12/20/10 11/03/11  [name]  E, MD    Physical Exam: Vitals:   06/29/20 2004 06/29/20 2052 06/29/20 2100 06/29/20 2200  BP: (!) 148/77 120/77 111/77 101/74  Pulse: 83 79 78 78  Resp: 18 20 (!) 22 (!) 24  Temp:  98.2 F (36.8 C)    TempSrc:  Oral    SpO2: 100% 97% 99% 96%   Constitutional: Elderly woman resting supine in bed, NAD, calm, comfortable Eyes: PERRL, lids and conjunctivae normal ENMT: Mucous membranes are dry. Posterior pharynx clear of any exudate or lesions.Normal dentition.  Neck: normal, supple, no masses. Respiratory: Faint inspiratory crackles bilaterally.  Normal respiratory effort. No accessory muscle use.  Cardiovascular: Regular rate and rhythm, no murmurs / rubs / gallops. No extremity edema. 2+ pedal pulses.  ICD in place left chest wall. Abdomen: no tenderness, no masses palpated. No hepatosplenomegaly. Bowel sounds positive.  Musculoskeletal: no clubbing / cyanosis. No joint deformity upper and lower extremities. Good ROM, no contractures. Normal muscle tone.  Skin: no rashes, lesions, ulcers. No induration Neurologic: CN 2-12 grossly intact. Sensation intact, Strength 5/5 in all 4.  Psychiatric: Normal judgment and insight. Alert and oriented x 3. Normal mood.   Labs on Admission: I have personally reviewed following labs and imaging studies  CBC: Recent Labs  Lab 06/24/20 2131 06/27/20 1659 06/29/20 1146  WBC 3.9* 3.8* 5.2  NEUTROABS  --   --  3.6  HGB 13.5 13.2 13.5  HCT 43.2 40.6 41.9  MCV 87.6 86.2 86.6  PLT 147* 186 458   Basic Metabolic Panel: Recent Labs  Lab 06/24/20 2131 06/27/20 1659 06/29/20 1146  NA 136 138 141  K  4.1 4.2 4.5  CL 101 105 106  CO2 25 22 24   GLUCOSE 131* 136* 158*  BUN 41* 49* 46*  CREATININE 1.77* 1.87* 1.89*  CALCIUM 9.0 8.8* 9.0   GFR: Estimated Creatinine Clearance: 27.4 mL/min (A) (by C-G formula based on SCr of 1.89 mg/dL (H)). Liver Function Tests: Recent Labs  Lab 06/27/20 1659 06/29/20 1146  AST 45* 43*  ALT 28 24  ALKPHOS 34* 32*  BILITOT 0.7 0.8  PROT 6.8 6.8  ALBUMIN 3.3* 2.9*   No results for input(s): LIPASE, AMYLASE in the last 168 hours. No results for input(s): AMMONIA in the last 168 hours. Coagulation Profile: No results for input(s): INR, PROTIME in the last 168 hours. Cardiac Enzymes: No results for input(s): CKTOTAL, CKMB, CKMBINDEX, TROPONINI in the last 168 hours. BNP (last 3 results) No results for input(s): PROBNP in the last 8760 hours. HbA1C: No results for input(s): HGBA1C in the last 72 hours. CBG: Recent Labs  Lab 06/24/20 2041 06/24/20 2157  GLUCAP 121* 111*   Lipid Profile: Recent Labs    06/29/20 2029  TRIG 209*   Thyroid Function Tests: No results for input(s): TSH, T4TOTAL, FREET4, T3FREE, THYROIDAB in the last 72 hours. Anemia Panel: Recent Labs    06/29/20 2029  FERRITIN 482*   Urine analysis:    Component Value Date/Time   COLORURINE YELLOW 06/24/2020 2208   APPEARANCEUR CLEAR 06/24/2020 2208   LABSPEC 1.009 06/24/2020 2208   PHURINE 6.0 06/24/2020 2208   GLUCOSEU NEGATIVE 06/24/2020 2208   HGBUR NEGATIVE 06/24/2020 [place] 06/24/2020 2208   BILIRUBINUR negative 07/26/2013 [place] 06/24/2020 2208   PROTEINUR NEGATIVE 06/24/2020 2208   UROBILINOGEN 0.2 03/20/2015 1425   NITRITE NEGATIVE 06/24/2020 2208   LEUKOCYTESUR MODERATE (A) 06/24/2020 2208    Radiological Exams on Admission:  DG Chest Portable 1 View  Result Date: 06/29/2020 CLINICAL DATA:  COVID-19 positive with increased shortness of breath and weakness. EXAM: PORTABLE CHEST 1 VIEW COMPARISON:  06/27/2020  FINDINGS: Lungs are hypoinflated with stable mild bilateral patchy opacification likely viral pneumonia in this known COVID-19 positive patient. No evidence of effusion. Mild stable cardiomegaly. Remainder of the exam is unchanged. IMPRESSION: Stable bilateral patchy airspace process likely viral pneumonia in this known COVID-19 positive patient. Electronically Signed   By: Marin Olp M.D.   On: 06/29/2020 12:09    EKG: Personally reviewed. A sensed V paced rhythm.  Assessment/Plan Principal Problem:   Acute hypoxemic respiratory failure due to COVID-19 [facility]) Active Problems:   Type 2 diabetes mellitus with nephropathy (HCC)   Chronic combined systolic and diastolic CHF (congestive heart failure) (HCC)   S/P TAVR (transcatheter aortic valve replacement)   CKD (chronic kidney disease), stage IV (HCC)   Hypertension associated with diabetes ([place])   Hyperlipidemia associated with type 2 diabetes mellitus ([place])  [name] [name] is a 75 y.o. 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  Acute hypoxemic respiratory failure due to COVID-19 pneumonia: SARS-CoV-2 PCR + 06/27/2020. Received Regen-CoV monoclonal antibody infusion 11/13 when seen in the ED but now admitted with new hypoxia.  She has not received any of the COVID-19 vaccinations. -Start IV remdesivir -Start IV Solu-Medrol 40 mg twice daily -Continue supplemental O2 as needed and wean as able -Incentive spirometer, flutter valve, albuterol as needed -Antitussives, vitamin C, zinc -Mobilize with PT  Chronic combined systolic and diastolic CHF S/p BiV ICD: EF 20-25%, G1DD by TTE 01/02/20.  Appears slightly hypovolemic on admission.  She will be given small 250 cc NS bolus. -Resume home Lasix when more  euvolemic -Resume home Coreg tomorrow as BP allows -Hold spironolactone due to soft blood pressure -Monitor strict I/O's and daily weights  CKD stage IV: Relatively stable.  Continue to monitor.  Insulin-dependent type 2 diabetes: Last A1c 7.4% on 05/07/2020.  Place on sensitive SSI and adjust as needed while on steroids.  Hypertension: BP low normotensive on admission.  Resume Coreg and holding spironolactone, Lasix as above.  Severe AS s/p TAVR: Chronic and stable without aortic valve regurgitation or stenosis seen on last TTE 01/02/2020.  Hyperlipidemia: Continue Zetia and statin.  DVT prophylaxis: Subcutaneous heparin Code Status: Full code, confirmed with patient Family Communication: Discussed with patient, she has discussed with family Disposition Plan: From home and likely discharge home pending improvement in respiratory symptoms, ability to wean supplemental O2, and transition to outpatient therapy. Consults called: None Admission status:  Status is: Inpatient  Remains inpatient appropriate because:IV treatments appropriate due to intensity of illness or inability to take PO and Inpatient level of care appropriate due to severity of illness   Dispo: The patient is from: Home              Anticipated d/c is to: Home              Anticipated d/c date is: 3 days              Patient currently is not medically stable to d/c.   Zada Finders MD [facility]  If 7PM-7AM, please contact night-coverage [email or web address]  06/29/2020, 11:10 PM

## 2020-06-30 ENCOUNTER — Encounter (HOSPITAL_COMMUNITY): Payer: Self-pay | Admitting: Internal Medicine

## 2020-06-30 DIAGNOSIS — J9601 Acute respiratory failure with hypoxia: Secondary | ICD-10-CM | POA: Diagnosis not present

## 2020-06-30 DIAGNOSIS — U071 COVID-19: Secondary | ICD-10-CM | POA: Diagnosis not present

## 2020-06-30 LAB — CBC WITH DIFFERENTIAL/PLATELET
Abs Immature Granulocytes: 0.05 10*3/uL (ref 0.00–0.07)
Basophils Absolute: 0 10*3/uL (ref 0.0–0.1)
Basophils Relative: 0 %
Eosinophils Absolute: 0 10*3/uL (ref 0.0–0.5)
Eosinophils Relative: 0 %
HCT: 39.2 % (ref 36.0–46.0)
Hemoglobin: 12.4 g/dL (ref 12.0–15.0)
Immature Granulocytes: 1 %
Lymphocytes Relative: 9 %
Lymphs Abs: 0.5 10*3/uL — ABNORMAL LOW (ref 0.7–4.0)
MCH: 27.7 pg (ref 26.0–34.0)
MCHC: 31.6 g/dL (ref 30.0–36.0)
MCV: 87.5 fL (ref 80.0–100.0)
Monocytes Absolute: 0.2 10*3/uL (ref 0.1–1.0)
Monocytes Relative: 3 %
Neutro Abs: 4.6 10*3/uL (ref 1.7–7.7)
Neutrophils Relative %: 87 %
Platelets: 258 10*3/uL (ref 150–400)
RBC: 4.48 MIL/uL (ref 3.87–5.11)
RDW: 15 % (ref 11.5–15.5)
WBC: 5.3 10*3/uL (ref 4.0–10.5)
nRBC: 0 % (ref 0.0–0.2)

## 2020-06-30 LAB — LACTIC ACID, PLASMA: Lactic Acid, Venous: 1.3 mmol/L (ref 0.5–1.9)

## 2020-06-30 LAB — COMPREHENSIVE METABOLIC PANEL
ALT: 21 U/L (ref 0–44)
AST: 35 U/L (ref 15–41)
Albumin: 2.6 g/dL — ABNORMAL LOW (ref 3.5–5.0)
Alkaline Phosphatase: 34 U/L — ABNORMAL LOW (ref 38–126)
Anion gap: 13 (ref 5–15)
BUN: 47 mg/dL — ABNORMAL HIGH (ref 8–23)
CO2: 20 mmol/L — ABNORMAL LOW (ref 22–32)
Calcium: 8.8 mg/dL — ABNORMAL LOW (ref 8.9–10.3)
Chloride: 106 mmol/L (ref 98–111)
Creatinine, Ser: 1.67 mg/dL — ABNORMAL HIGH (ref 0.44–1.00)
GFR, Estimated: 32 mL/min — ABNORMAL LOW (ref >60)
Glucose, Bld: 196 mg/dL — ABNORMAL HIGH (ref 70–99)
Potassium: 4.5 mmol/L (ref 3.5–5.1)
Sodium: 139 mmol/L (ref 135–145)
Total Bilirubin: 0.7 mg/dL (ref 0.3–1.2)
Total Protein: 6.4 g/dL — ABNORMAL LOW (ref 6.5–8.1)

## 2020-06-30 LAB — PHOSPHORUS: Phosphorus: 3.5 mg/dL (ref 2.5–4.6)

## 2020-06-30 LAB — GLUCOSE, CAPILLARY
Glucose-Capillary: 212 mg/dL — ABNORMAL HIGH (ref 70–99)
Glucose-Capillary: 216 mg/dL — ABNORMAL HIGH (ref 70–99)
Glucose-Capillary: 206 mg/dL — ABNORMAL HIGH (ref 70–99)
Glucose-Capillary: 205 mg/dL — ABNORMAL HIGH (ref 70–99)
Glucose-Capillary: 234 mg/dL — ABNORMAL HIGH (ref 70–99)
Glucose-Capillary: 219 mg/dL — ABNORMAL HIGH (ref 70–99)

## 2020-06-30 LAB — BRAIN NATRIURETIC PEPTIDE: B Natriuretic Peptide: 858 pg/mL — ABNORMAL HIGH (ref 0.0–100.0)

## 2020-06-30 LAB — FERRITIN: Ferritin: 426 ng/mL — ABNORMAL HIGH (ref 11–307)

## 2020-06-30 LAB — C-REACTIVE PROTEIN: CRP: 5.4 mg/dL — ABNORMAL HIGH (ref <1.0)

## 2020-06-30 LAB — MAGNESIUM: Magnesium: 2.4 mg/dL (ref 1.7–2.4)

## 2020-06-30 LAB — CBG MONITORING, ED: Glucose-Capillary: 161 mg/dL — ABNORMAL HIGH (ref 70–99)

## 2020-06-30 LAB — D-DIMER, QUANTITATIVE: D-Dimer, Quant: 0.87 ug/mL-FEU — ABNORMAL HIGH (ref 0.00–0.50)

## 2020-06-30 MED ORDER — FENOFIBRATE 54 MG PO TABS
54.0000 mg | ORAL_TABLET | Freq: Every day | ORAL | Status: DC
  Administered 2020-06-30 – 2020-07-04 (×5): 54 mg via ORAL
  Filled 2020-06-30 (×5): qty 1

## 2020-06-30 MED ORDER — CARVEDILOL 6.25 MG PO TABS
6.2500 mg | ORAL_TABLET | Freq: Two times a day (BID) | ORAL | Status: DC
  Administered 2020-06-30 – 2020-07-04 (×8): 6.25 mg via ORAL
  Filled 2020-06-30 (×8): qty 1

## 2020-06-30 MED ORDER — INSULIN GLARGINE 100 UNIT/ML ~~LOC~~ SOLN
20.0000 [IU] | Freq: Every day | SUBCUTANEOUS | Status: DC
  Administered 2020-06-30 – 2020-07-01 (×2): 20 [IU] via SUBCUTANEOUS
  Filled 2020-06-30 (×2): qty 0.2

## 2020-06-30 NOTE — Evaluation (Signed)
Physical Therapy Evaluation Patient Details Name: [name] [name] MRN: [id number] DOB: May 28, 1946 Today's Date: 06/30/2020   History of Present Illness  74 y.o. 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 +COVID and discharged home. Returned to ED 11/15 by EMS for shortness of breath.   Clinical Impression   Pt admitted with above diagnosis. Patient very independent prior to illness and admission. She uses a rollator due to bil knee pain but denies history of imbalance or falls. She was able to ambulate on room air with sats increasing 92 to 94% and max HR 91. Unclear how long pt will remain hospitalized. Could benefit from seated and/or standing there-ex.  Pt currently with functional limitations due to the deficits listed below (see PT Problem List). Pt will benefit from skilled PT to increase their independence and safety with mobility to allow discharge to the venue listed below.       Follow Up Recommendations Home health PT;Supervision for mobility/OOB (and HHOT)    Equipment Recommendations  None recommended by PT    Recommendations for Other Services       Precautions / Restrictions Precautions Precautions: Fall      Mobility  Bed Mobility               General bed mobility comments: Up in chair on arrival    Transfers Overall transfer level: Needs assistance Equipment used: Rolling walker (2 wheeled) Transfers: Sit to/from Stand Sit to Stand: Min guard         General transfer comment: for safety and multiple lines  Ambulation/Gait Ambulation/Gait assistance: Min guard Gait Distance (Feet): 25 Feet Assistive device: Rolling walker (2 wheeled) Gait Pattern/deviations: Step-through pattern;Trunk flexed;Decreased stride length Gait velocity: appropriately decr      Stairs             Wheelchair Mobility    Modified Rankin (Stroke Patients Only)       Balance Overall balance assessment: No apparent balance deficits (not formally assessed) (pt uses RW due to bil knee pain)                                           Pertinent Vitals/Pain Pain Assessment: Faces Faces Pain Scale: Hurts a little bit Pain Location: bil knees Pain Descriptors / Indicators: Aching Pain Intervention(s): Limited activity within patient's tolerance;Monitored during session    Home Living Family/patient expects to be discharged to:: Private residence Living Arrangements: Other relatives (grandson) Available Help at Discharge: Family Type of Home: House Home Access: Stairs to enter;Ramped entrance (ramp at her home; steps at son's) Entrance Stairs-Rails: Psychiatric nurse of Steps: 3 Home Layout: One level Home Equipment: Environmental consultant - 2 wheels;Walker - 4 wheels;Cane - single point;Tub bench Additional Comments: reports she will go stay with her son for a few days    Prior Function Level of Independence: Independent with assistive device(s)         Comments: uses rollator; doesn't use tub bench prefers to get down into tub (in/;out on her own); does all shopping, meaks, housework herself     Hand Dominance        Extremity/Trunk Assessment   Upper Extremity Assessment Upper Extremity Assessment: Generalized weakness  Lower Extremity Assessment Lower Extremity Assessment: Generalized weakness    Cervical / Trunk Assessment Cervical / Trunk Assessment: Normal  Communication   Communication: No difficulties  Cognition Arousal/Alertness: Awake/alert Behavior During Therapy: WFL for tasks assessed/performed Overall Cognitive Status: Within Functional Limits for tasks assessed                                        General Comments      Exercises     Assessment/Plan    PT Assessment Patient needs continued PT  services  PT Problem List Decreased strength;Decreased activity tolerance;Decreased balance;Decreased mobility;Decreased knowledge of use of DME;Pain       PT Treatment Interventions DME instruction;Gait training;Functional mobility training;Therapeutic activities;Therapeutic exercise;Patient/family education    PT Goals (Current goals can be found in the Care Plan section)  Acute Rehab PT Goals Patient Stated Goal: feel better and go home PT Goal Formulation: With patient Time For Goal Achievement: 07/14/20 Potential to Achieve Goals: Good    Frequency Min 2X/week   Barriers to discharge   lives alone but plans to stay with her son for a few days; other family members to rotate staying with her    Co-evaluation               AM-PAC PT "6 Clicks" Mobility  Outcome Measure Help needed turning from your back to your side while in a flat bed without using bedrails?: None Help needed moving from lying on your back to sitting on the side of a flat bed without using bedrails?: A Little Help needed moving to and from a bed to a chair (including a wheelchair)?: A Little Help needed standing up from a chair using your arms (e.g., wheelchair or bedside chair)?: None Help needed to walk in hospital room?: A Little Help needed climbing 3-5 steps with a railing? : A Little 6 Click Score: 20    End of Session Equipment Utilized During Treatment: Oxygen Activity Tolerance: Patient tolerated treatment well Patient left: in chair;with call bell/phone within reach Nurse Communication: Mobility status;Other (comment) (RN ok with leaving pt on room air) PT Visit Diagnosis: Muscle weakness (generalized) (M62.81);Difficulty in walking, not elsewhere classified (R26.2);Pain Pain - Right/Left:  (bil) Pain - part of body: Knee    Time: 0109-3235 PT Time Calculation (min) (ACUTE ONLY): 34 min   Charges:   PT Evaluation $PT Eval Low Complexity: 1 Low PT Treatments $Self Care/Home  Management: 8-22         Arby Barrette, PT Pager [phone]   Rexanne Mano 06/30/2020, 3:15 PM

## 2020-06-30 NOTE — ED Notes (Signed)
CBG 161 

## 2020-06-30 NOTE — Progress Notes (Signed)
PROGRESS NOTE                                                                                                                                                                                                             Patient Demographics:    [name] [name], is a 74 y.o. female, DOB - 21-Jun-1946, BWI:[id number]  Outpatient Primary MD for the patient is Fayrene Helper, MD    LOS - 1  Admit date - 06/29/2020    Chief Complaint  Patient presents with  . Covid Positive       Brief Narrative (HPI from H&P)  [name] [name] is a 74 y.o. 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  She was diagnosed with COVID-19 pneumonia and admitted to the hospital.  She is unfortunately not vaccinated.   Subjective:    [name] [name] today has, No headache, No chest pain, No abdominal pain - No Nausea, No new weakness tingling or numbness, no SOB.   Assessment  & Plan :    1.  Acute Covid 19 Viral Pneumonitis during the ongoing 2020 Covid 19 Pandemic - she is unfortunately not vaccinated and so far has incurred mild to moderate parenchymal lung injury, she has been started on steroids and Remdesivir combination which will be continued.  Monitor inflammatory markers closely.  If needed she has consented for Actemra use.  Currently stable on room air.  Please note patient seems to be on chronic steroids at home.  Encouraged the patient to sit up in chair in the daytime use I-S and flutter valve for pulmonary toiletry and then prone in bed when at night.  Will advance activity and titrate down oxygen as possible.  Actemra/Baricitinib  off label use - patient was told that if COVID-19 pneumonitis gets worse we might  potentially use Actemra off label, patient denies any known history of active diverticulitis, tuberculosis or hepatitis, understands the risks and benefits and wants to proceed with Actemra treatment if required.    Recent Labs  Lab 06/24/20 2131 06/27/20 1659 06/27/20 1718 06/29/20 1146 06/29/20 2029 06/29/20 2051 06/30/20 0427  WBC  3.9* 3.8*  --  5.2  --   --  5.3  HGB 13.5 13.2  --  13.5  --   --  12.4  HCT 43.2 40.6  --  41.9  --   --  39.2  PLT 147* 186  --  271  --   --  258  CRP  --   --   --   --  5.2*  --  5.4*  DDIMER  --   --   --   --  0.68*  --  0.87*  PROCALCITON  --   --   --   --  0.24  --   --   AST  --  45*  --  43*  --   --  35  ALT  --  28  --  24  --   --  21  ALKPHOS  --  34*  --  32*  --   --  34*  BILITOT  --  0.7  --  0.8  --   --  0.7  ALBUMIN  --  3.3*  --  2.9*  --   --  2.6*  LATICACIDVEN  --   --   --  1.6  --  2.0* 1.3  SARSCOV2NAA  --   --  POSITIVE*  --   --   --   --     2.  Chronic combined systolic and diastolic heart failure.  EF 25% on recent echocardiogram.  S/p AICD placement.  Currently blood pressure on the lower side hence have dropped home dose Coreg to 6.25 twice daily from 25 twice daily, Lasix and Aldactone to be dosed daily as needed, blood pressure on the softer side currently.  Renal function precludes the use of ACE/ARB or Entresto.  3.  Aortic stenosis s/p TAVR placement.  Stable.  4.  CKD stage IV.  Baseline creatinine around 1.5-1.7.  Monitor.  5.  Hypertension.  Blood pressure medications adjusted.  Currently on moderate dose Coreg, Lasix and Aldactone to be dosed lately.  7.  Dyslipidemia.  Continue statin and Zetia.  8.  DM type II.  Currently on sliding scale, oral hypoglycemics held, Lantus will be added as she is on steroids.  Lab Results  Component Value Date   HGBA1C 7.4 (H) 05/07/2020   CBG (last 3)  Recent Labs    06/30/20 0022 06/30/20 0722  GLUCAP 161* 212*     Condition - Extremely  Guarded  Family Communication  :  Enrique Sack on 06/30/20 [phone]  Code Status :  Full  Consults  :  None  Procedures  :  None  PUD Prophylaxis : None  Disposition Plan  :    Status is: Inpatient  Remains inpatient appropriate because:IV treatments appropriate due to intensity of illness or inability to take PO   Dispo: The patient is from: Home              Anticipated d/c is to: Home              Anticipated d/c date is: > 3 days              Patient currently is not medically stable to d/c.   DVT Prophylaxis  :  Heparin   Lab Results  Component Value Date   PLT 258 06/30/2020    Diet :  Diet Order            Diet heart  healthy/carb modified Room service appropriate? Yes; Fluid consistency: Thin  Diet effective now                  Inpatient Medications  Scheduled Meds: . vitamin C  500 mg Oral Daily  . aspirin EC  81 mg Oral q morning - 10a  . carvedilol  25 mg Oral BID WC  . ezetimibe  10 mg Oral Daily  . heparin  5,000 Units Subcutaneous Q8H  . insulin aspart  0-5 Units Subcutaneous QHS  . insulin aspart  0-9 Units Subcutaneous TID WC  . methylPREDNISolone (SOLU-MEDROL) injection  40 mg Intravenous Q12H  . pravastatin  40 mg Oral q1800  . sodium chloride flush  3 mL Intravenous Q12H  . zinc sulfate  220 mg Oral Daily   Continuous Infusions: . [START ON 07/01/2020] remdesivir 100 mg in NS 100 mL     PRN Meds:.acetaminophen, albuterol, chlorpheniramine-HYDROcodone, guaiFENesin-dextromethorphan, ondansetron **OR** ondansetron (ZOFRAN) IV  Antibiotics  :    Anti-infectives (From admission, onward)   Start     Dose/Rate Route Frequency Ordered Stop   07/01/20 1000  remdesivir 100 mg in sodium chloride 0.9 % 100 mL IVPB       "Followed by" Linked Group Details   100 mg 200 mL/hr over 30 Minutes Intravenous Daily 06/29/20 2342 07/05/20 0959   06/30/20 0100  remdesivir 200 mg in sodium chloride 0.9% 250 mL IVPB       "Followed by" Linked  Group Details   200 mg 580 mL/hr over 30 Minutes Intravenous Once 06/29/20 2342 06/30/20 0358       Time Spent in minutes  30   Lala Lund M.D on 06/30/2020 at 9:15 AM  To page go to [email or web address] - password [facility]  [facility] -  Office  [phone]   See all Orders from today for further details    Objective:   Vitals:   06/30/20 0430 06/30/20 0515 06/30/20 0522 06/30/20 0855  BP: 111/81 110/85 127/78 111/68  Pulse: 70 74 74 70  Resp: (!) 26  20 (!) 22  Temp:  99.1 F (37.3 C) 99.2 F (37.3 C) 98.1 F (36.7 C)  TempSrc:  Oral Oral Axillary  SpO2: 97% 98% 97% 96%  Weight:   79.8 kg   Height:   5\' 6"  (1.676 m)     Wt Readings from Last 3 Encounters:  06/30/20 79.8 kg  06/24/20 79.8 kg  05/11/20 79.4 kg     Intake/Output Summary (Last 24 hours) at 06/30/2020 0915 Last data filed at 06/30/2020 0522 Gross per 24 hour  Intake 240 ml  Output --  Net 240 ml     Physical Exam  Awake Alert, No new F.N deficits, Normal affect [place].AT,PERRAL Supple Neck,No JVD, No cervical lymphadenopathy appriciated.  Symmetrical Chest wall movement, Good air movement bilaterally, CTAB RRR,No Gallops,Rubs or new Murmurs, No Parasternal Heave +ve B.Sounds, Abd Soft, No tenderness, No organomegaly appriciated, No rebound - guarding or rigidity. No Cyanosis, Clubbing or edema, No new Rash or bruise      Data Review:    CBC Recent Labs  Lab 06/24/20 2131 06/27/20 1659 06/29/20 1146 06/30/20 0427  WBC 3.9* 3.8* 5.2 5.3  HGB 13.5 13.2 13.5 12.4  HCT 43.2 40.6 41.9 39.2  PLT 147* 186 271 258  MCV 87.6 86.2 86.6 87.5  MCH 27.4 28.0 27.9 27.7  MCHC 31.3 32.5 32.2 31.6  RDW 15.2 14.9 14.9 15.0  LYMPHSABS  --   --  1.0 0.5*  MONOABS  --   --  0.4 0.2  EOSABS  --   --  0.0 0.0  BASOSABS  --   --  0.0 0.0    Recent Labs  Lab 06/24/20 2131 06/27/20 1659 06/29/20 1146 06/29/20 2029 06/29/20 2051 06/30/20 0427  NA 136 138 141  --   --  139  K 4.1 4.2 4.5   --   --  4.5  CL 101 105 106  --   --  106  CO2 25 22 24   --   --  20*  GLUCOSE 131* 136* 158*  --   --  196*  BUN 41* 49* 46*  --   --  47*  CREATININE 1.77* 1.87* 1.89*  --   --  1.67*  CALCIUM 9.0 8.8* 9.0  --   --  8.8*  AST  --  45* 43*  --   --  35  ALT  --  28 24  --   --  21  ALKPHOS  --  34* 32*  --   --  34*  BILITOT  --  0.7 0.8  --   --  0.7  ALBUMIN  --  3.3* 2.9*  --   --  2.6*  MG  --   --   --   --   --  2.4  CRP  --   --   --  5.2*  --  5.4*  DDIMER  --   --   --  0.68*  --  0.87*  PROCALCITON  --   --   --  0.24  --   --   LATICACIDVEN  --   --  1.6  --  2.0* 1.3    ------------------------------------------------------------------------------------------------------------------ Recent Labs    06/29/20 2029  TRIG 209*    Lab Results  Component Value Date   HGBA1C 7.4 (H) 05/07/2020   ------------------------------------------------------------------------------------------------------------------ No results for input(s): TSH, T4TOTAL, T3FREE, THYROIDAB in the last 72 hours.  Invalid input(s): FREET3  Cardiac Enzymes No results for input(s): CKMB, TROPONINI, MYOGLOBIN in the last 168 hours.  Invalid input(s): CK ------------------------------------------------------------------------------------------------------------------    Component Value Date/Time   BNP 577.0 (H) 09/10/2019 0542    Micro Results Recent Results (from the past 240 hour(s))  Urine culture     Status: Abnormal   Collection Time: 06/24/20 10:08 PM   Specimen: Urine, Random  Result Value Ref Range Status   Specimen Description   Final    URINE, RANDOM Performed at [facility], [address], [place], [place] [address]    Special Requests   Final    NONE Performed at [facility], [address] [place], [place] [address]    Culture >=100,000 COLONIES/mL ESCHERICHIA COLI (A)  Final   Report Status 06/27/2020 FINAL  Final   Organism ID, Bacteria ESCHERICHIA COLI (A)  Final       Susceptibility   Escherichia coli - MIC*    AMPICILLIN 8 SENSITIVE Sensitive     CEFAZOLIN <=4 SENSITIVE Sensitive     CEFEPIME <=0.12 SENSITIVE Sensitive     CEFTRIAXONE <=0.25 SENSITIVE Sensitive     CIPROFLOXACIN <=0.25 SENSITIVE Sensitive     GENTAMICIN <=1 SENSITIVE Sensitive     IMIPENEM <=0.25 SENSITIVE Sensitive     NITROFURANTOIN <=16 SENSITIVE Sensitive     TRIMETH/SULFA <=20 SENSITIVE Sensitive     AMPICILLIN/SULBACTAM <=2 SENSITIVE Sensitive     PIP/TAZO <=4 SENSITIVE Sensitive     * >=  100,000 COLONIES/mL ESCHERICHIA COLI  Respiratory Panel by RT PCR (Flu A&B, Covid) - Nasopharyngeal Swab     Status: Abnormal   Collection Time: 06/27/20  5:18 PM   Specimen: Nasopharyngeal Swab  Result Value Ref Range Status   SARS Coronavirus 2 by RT PCR POSITIVE (A) NEGATIVE Final    Comment: RESULT CALLED TO, READ BACK BY AND VERIFIED WITH: KENDRICK,J @ 0814 ON 06/27/20 BY JUW (NOTE) SARS-CoV-2 target nucleic acids are DETECTED.  SARS-CoV-2 RNA is generally detectable in upper respiratory specimens  during the acute phase of infection. Positive results are indicative of the presence of the identified virus, but do not rule out bacterial infection or co-infection with other pathogens not detected by the test. Clinical correlation with patient history and other diagnostic information is necessary to determine patient infection status. The expected result is Negative.  Fact Sheet for Patients:  PinkCheek.be  Fact Sheet for Healthcare Providers: GravelBags.it  This test is not yet approved or cleared by the [place] FDA and  has been authorized for detection and/or diagnosis of SARS-CoV-2 by FDA under an Emergency Use Authorization (EUA).  This EUA will remain in effect (meaning this test can b e used) for the duration of  the COVID-19 declaration under Section 564(b)(1) of the Act, 21 U.S.C. section  360bbb-3(b)(1), unless the authorization is terminated or revoked sooner.      Influenza A by PCR NEGATIVE NEGATIVE Final   Influenza B by PCR NEGATIVE NEGATIVE Final    Comment: (NOTE) The Xpert Xpress SARS-CoV-2/FLU/RSV assay is intended as an aid in  the diagnosis of influenza from Nasopharyngeal swab specimens and  should not be used as a sole basis for treatment. Nasal washings and  aspirates are unacceptable for Xpert Xpress SARS-CoV-2/FLU/RSV  testing.  Fact Sheet for Patients: PinkCheek.be  Fact Sheet for Healthcare Providers: GravelBags.it  This test is not yet approved or cleared by the [place] FDA and  has been authorized for detection and/or diagnosis of SARS-CoV-2 by  FDA under an Emergency Use Authorization (EUA). This EUA will remain  in effect (meaning this test can be used) for the duration of the  Covid-19 declaration under Section 564(b)(1) of the Act, 21  U.S.C. section 360bbb-3(b)(1), unless the authorization is  terminated or revoked. Performed at [facility], [address], [place], [place] [address]     Radiology Reports US RENAL  Result Date: 06/14/2020 CLINICAL DATA:  Chronic renal disease. EXAM: RENAL / URINARY TRACT ULTRASOUND COMPLETE COMPARISON:  None. FINDINGS: Right Kidney: Renal measurements: 11.5 x 4.6 x 4.6 cm = volume: 127 mL. Increased cortical echogenicity. Left Kidney: Renal measurements: 10.5 x 5.7 x 4.6 cm = volume: 142 mL. Increased cortical echogenicity. Bladder: Appears normal for degree of bladder distention. Other: None. IMPRESSION: Medical renal disease.  No acute abnormalities noted. Electronically Signed   By: Dorise Bullion III M.D   On: 06/14/2020 11:30   DG Chest Portable 1 View  Result Date: 06/29/2020 CLINICAL DATA:  COVID-19 positive with increased shortness of breath and weakness. EXAM: PORTABLE CHEST 1 VIEW COMPARISON:  06/27/2020 FINDINGS: Lungs are  hypoinflated with stable mild bilateral patchy opacification likely viral pneumonia in this known COVID-19 positive patient. No evidence of effusion. Mild stable cardiomegaly. Remainder of the exam is unchanged. IMPRESSION: Stable bilateral patchy airspace process likely viral pneumonia in this known COVID-19 positive patient. Electronically Signed   By: Marin Olp M.D.   On: 06/29/2020 12:09   DG Chest Portable 1 View  Result Date: 06/27/2020 CLINICAL DATA:  Cough EXAM: PORTABLE CHEST 1 VIEW COMPARISON:  December 05, 2019 FINDINGS: The cardiomediastinal silhouette is unchanged in contour. Status post TAVR. LEFT chest AICD. No pleural effusion. No pneumothorax. There are RIGHT greater than LEFT peripheral predominant heterogeneous opacities. Visualized abdomen is unremarkable. Multilevel degenerative changes of the thoracic spine. IMPRESSION: RIGHT greater than LEFT peripheral predominant heterogeneous opacities, may represent multifocal infection, including COVID-19 pneumonia. Followup PA and lateral chest X-ray is recommended in 3-4 weeks after appropriate treatment to ensure resolution and exclude underlying malignancy. Electronically Signed   By: Valentino Saxon MD   On: 06/27/2020 14:57

## 2020-06-30 NOTE — ED Notes (Signed)
Pt given a [place] sandwich, 2 packs of cheese, and a cup of diet sprite. Pt repositioned in bed, call light within reach.

## 2020-06-30 NOTE — ED Notes (Signed)
Report given to [facility], RN

## 2020-07-01 DIAGNOSIS — I5042 Chronic combined systolic (congestive) and diastolic (congestive) heart failure: Secondary | ICD-10-CM

## 2020-07-01 DIAGNOSIS — U071 COVID-19: Secondary | ICD-10-CM | POA: Diagnosis not present

## 2020-07-01 DIAGNOSIS — E1121 Type 2 diabetes mellitus with diabetic nephropathy: Secondary | ICD-10-CM

## 2020-07-01 LAB — COMPREHENSIVE METABOLIC PANEL
ALT: 20 U/L (ref 0–44)
AST: 33 U/L (ref 15–41)
Albumin: 2.6 g/dL — ABNORMAL LOW (ref 3.5–5.0)
Alkaline Phosphatase: 33 U/L — ABNORMAL LOW (ref 38–126)
Anion gap: 9 (ref 5–15)
BUN: 49 mg/dL — ABNORMAL HIGH (ref 8–23)
CO2: 21 mmol/L — ABNORMAL LOW (ref 22–32)
Calcium: 9.1 mg/dL (ref 8.9–10.3)
Chloride: 108 mmol/L (ref 98–111)
Creatinine, Ser: 1.49 mg/dL — ABNORMAL HIGH (ref 0.44–1.00)
GFR, Estimated: 36 mL/min — ABNORMAL LOW (ref >60)
Glucose, Bld: 196 mg/dL — ABNORMAL HIGH (ref 70–99)
Potassium: 4.7 mmol/L (ref 3.5–5.1)
Sodium: 138 mmol/L (ref 135–145)
Total Bilirubin: 0.5 mg/dL (ref 0.3–1.2)
Total Protein: 6.2 g/dL — ABNORMAL LOW (ref 6.5–8.1)

## 2020-07-01 LAB — CBC WITH DIFFERENTIAL/PLATELET
Abs Immature Granulocytes: 0 10*3/uL (ref 0.00–0.07)
Basophils Absolute: 0 10*3/uL (ref 0.0–0.1)
Basophils Relative: 0 %
Eosinophils Absolute: 0 10*3/uL (ref 0.0–0.5)
Eosinophils Relative: 0 %
HCT: 39.1 % (ref 36.0–46.0)
Hemoglobin: 12.6 g/dL (ref 12.0–15.0)
Lymphocytes Relative: 7 %
Lymphs Abs: 0.5 10*3/uL — ABNORMAL LOW (ref 0.7–4.0)
MCH: 27.4 pg (ref 26.0–34.0)
MCHC: 32.2 g/dL (ref 30.0–36.0)
MCV: 85 fL (ref 80.0–100.0)
Monocytes Absolute: 0.1 10*3/uL (ref 0.1–1.0)
Monocytes Relative: 2 %
Neutro Abs: 6.5 10*3/uL (ref 1.7–7.7)
Neutrophils Relative %: 91 %
Platelets: 304 10*3/uL (ref 150–400)
RBC: 4.6 MIL/uL (ref 3.87–5.11)
RDW: 14.7 % (ref 11.5–15.5)
WBC: 7.1 10*3/uL (ref 4.0–10.5)
nRBC: 0 % (ref 0.0–0.2)
nRBC: 0 /100 WBC

## 2020-07-01 LAB — MAGNESIUM: Magnesium: 2.5 mg/dL — ABNORMAL HIGH (ref 1.7–2.4)

## 2020-07-01 LAB — BRAIN NATRIURETIC PEPTIDE: B Natriuretic Peptide: 1319.6 pg/mL — ABNORMAL HIGH (ref 0.0–100.0)

## 2020-07-01 LAB — D-DIMER, QUANTITATIVE: D-Dimer, Quant: 0.44 ug/mL-FEU (ref 0.00–0.50)

## 2020-07-01 LAB — GLUCOSE, CAPILLARY
Glucose-Capillary: 190 mg/dL — ABNORMAL HIGH (ref 70–99)
Glucose-Capillary: 304 mg/dL — ABNORMAL HIGH (ref 70–99)
Glucose-Capillary: 230 mg/dL — ABNORMAL HIGH (ref 70–99)
Glucose-Capillary: 231 mg/dL — ABNORMAL HIGH (ref 70–99)

## 2020-07-01 LAB — C-REACTIVE PROTEIN: CRP: 5.3 mg/dL — ABNORMAL HIGH (ref <1.0)

## 2020-07-01 MED ORDER — INSULIN GLARGINE 100 UNIT/ML ~~LOC~~ SOLN
24.0000 [IU] | Freq: Every day | SUBCUTANEOUS | Status: DC
  Administered 2020-07-02 – 2020-07-04 (×3): 24 [IU] via SUBCUTANEOUS
  Filled 2020-07-01 (×3): qty 0.24

## 2020-07-01 MED ORDER — PANTOPRAZOLE SODIUM 40 MG PO TBEC
40.0000 mg | DELAYED_RELEASE_TABLET | Freq: Every day | ORAL | Status: DC
  Administered 2020-07-01 – 2020-07-04 (×4): 40 mg via ORAL
  Filled 2020-07-01 (×4): qty 1

## 2020-07-01 NOTE — Progress Notes (Signed)
PROGRESS NOTE                                                                                                                                                                                                             Patient Demographics:    [name] [name], is a 74 y.o. female, DOB - 12-29-1945, UJW:[id number]  Outpatient Primary MD for the patient is Fayrene Helper, MD    LOS - 2  Admit date - 06/29/2020    Chief Complaint  Patient presents with  . Covid Positive       Brief Narrative (HPI from H&P)  [name] [name] is a 75 y.o. 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  She was diagnosed with COVID-19 pneumonia and admitted to the hospital.  She is unfortunately not vaccinated.   Subjective:    [name] [name] today does report generalized weakness and fatigue, she remold dyspnea with activity, denies chest pain.      Assessment  & Plan :    Acute Covid 19 Viral Pneumonitis during the ongoing 2020 Covid 19 Pandemic  - she is unfortunately not vaccinated and so far has incurred mild to moderate parenchymal lung injury -Continue with steroids -Continue with remdesiv -  Monitor inflammatory markers closely.   - If needed she has consented for Actemra use.  Currently stable on room air.  Please note patient seems to be on chronic steroids at home.  Encouraged the patient to sit up in chair in the daytime use I-S and flutter valve for pulmonary toiletry and then prone in bed when at night.  Will advance activity and titrate down oxygen as possible.  Actemra/Baricitinib  off label use - patient was told that if COVID-19 pneumonitis gets worse we might potentially use Actemra off label, patient  denies any known history of active diverticulitis, tuberculosis or hepatitis, understands the risks and benefits and wants to proceed with Actemra treatment if required.    Recent Labs  Lab 06/24/20 2131 06/27/20 1659 06/27/20 1718 06/29/20 1146 06/29/20 2029 06/29/20 2051 06/30/20 0427 07/01/20 0025  WBC 3.9* 3.8*  --  5.2  --   --  5.3 7.1  HGB 13.5 13.2  --  13.5  --   --  12.4 12.6  HCT 43.2 40.6  --  41.9  --   --  39.2 39.1  PLT 147* 186  --  271  --   --  258 304  CRP  --   --   --   --  5.2*  --  5.4* 5.3*  BNP  --   --   --   --   --   --  858.0* 1,319.6*  DDIMER  --   --   --   --  0.68*  --  0.87* 0.44  PROCALCITON  --   --   --   --  0.24  --   --   --   AST  --  45*  --  43*  --   --  35 33  ALT  --  28  --  24  --   --  21 20  ALKPHOS  --  34*  --  32*  --   --  34* 33*  BILITOT  --  0.7  --  0.8  --   --  0.7 0.5  ALBUMIN  --  3.3*  --  2.9*  --   --  2.6* 2.6*  LATICACIDVEN  --   --   --  1.6  --  2.0* 1.3  --   SARSCOV2NAA  --   --  POSITIVE*  --   --   --   --   --     Chronic combined systolic and diastolic heart failure.  EF 25% on recent echocardiogram.  S/p AICD placement.  Currently blood pressure on the lower side hence have dropped home dose Coreg to 6.25 twice daily from 25 twice daily, Lasix daily as needed, will give low-dose today, Aldactone to be dosed daily as needed, will hold on Aldactone today . - Renal function precludes the use of ACE/ARB or Entresto.  3.  Aortic stenosis s/p TAVR placement.  Stable.  4.  CKD stage IV.  Baseline creatinine around 1.5-1.7.  Monitor.  5.  Hypertension.  Blood pressure medications adjusted.  Currently on moderate dose Coreg, Lasix and Aldactone to be dosed lately.  7.  Dyslipidemia.  Continue statin and Zetia.  8.  DM type II.  Currently on sliding scale, oral hypoglycemics held, has some high readings, will increase her Lantus to 24 units daily.  Lab Results  Component Value Date   HGBA1C 7.4 (H)  05/07/2020   CBG (last 3)  Recent Labs    06/30/20 2221 07/01/20 0750 07/01/20 1140  GLUCAP 219* 190* 304*     Condition - Extremely Guarded  Family Communication  :  Enrique Sack on 06/30/20 [phone]  Code Status :  Full  Consults  :  None  Procedures  :  None  PUD Prophylaxis : None  Disposition Plan  :    Status is: Inpatient  Remains inpatient appropriate because:IV treatments appropriate due to intensity of illness or inability to take PO   Dispo: The patient is from: Home              Anticipated d/c is to: Home              Anticipated d/c date is: > 3 days              Patient currently is not medically stable to d/c.   DVT  Prophylaxis  :  Heparin   Lab Results  Component Value Date   PLT 304 07/01/2020    Diet :  Diet Order            Diet heart healthy/carb modified Room service appropriate? Yes; Fluid consistency: Thin  Diet effective now                  Inpatient Medications  Scheduled Meds: . vitamin C  500 mg Oral Daily  . aspirin EC  81 mg Oral q morning - 10a  . carvedilol  6.25 mg Oral BID WC  . ezetimibe  10 mg Oral Daily  . fenofibrate  54 mg Oral Daily  . heparin  5,000 Units Subcutaneous Q8H  . insulin aspart  0-5 Units Subcutaneous QHS  . insulin aspart  0-9 Units Subcutaneous TID WC  . insulin glargine  20 Units Subcutaneous Daily  . methylPREDNISolone (SOLU-MEDROL) injection  40 mg Intravenous Q12H  . pravastatin  40 mg Oral q1800  . sodium chloride flush  3 mL Intravenous Q12H  . zinc sulfate  220 mg Oral Daily   Continuous Infusions: . remdesivir 100 mg in NS 100 mL 100 mg (07/01/20 1232)   PRN Meds:.acetaminophen, albuterol, chlorpheniramine-HYDROcodone, guaiFENesin-dextromethorphan, [DISCONTINUED] ondansetron **OR** ondansetron (ZOFRAN) IV  Antibiotics  :    Anti-infectives (From admission, onward)   Start     Dose/Rate Route Frequency Ordered Stop   07/01/20 1000  remdesivir 100 mg in sodium chloride  0.9 % 100 mL IVPB       "Followed by" Linked Group Details   100 mg 200 mL/hr over 30 Minutes Intravenous Daily 06/29/20 2342 07/05/20 0959   06/30/20 0100  remdesivir 200 mg in sodium chloride 0.9% 250 mL IVPB       "Followed by" Linked Group Details   200 mg 580 mL/hr over 30 Minutes Intravenous Once 06/29/20 2342 06/30/20 0358        Emeline Gins Seyon Strader M.D on 07/01/2020 at 4:12 PM  To page go to [email or web address]   [facility] -  Office  [phone]   See all Orders from today for further details    Objective:   Vitals:   07/01/20 0432 07/01/20 0500 07/01/20 0800 07/01/20 1200  BP: 104/65     Pulse: 60  65 75  Resp: 20  (!) 21   Temp: 98.2 F (36.8 C)     TempSrc: Axillary     SpO2: 92%  94% 98%  Weight:  78.7 kg    Height:        Wt Readings from Last 3 Encounters:  07/01/20 78.7 kg  06/24/20 79.8 kg  05/11/20 79.4 kg     Intake/Output Summary (Last 24 hours) at 07/01/2020 1612 Last data filed at 07/01/2020 1300 Gross per 24 hour  Intake 800 ml  Output --  Net 800 ml     Physical Exam  Awake Alert, Oriented X 3, No new F.N deficits, Normal affect Symmetrical Chest wall movement, Good air movement bilaterally, CTAB RRR,No Gallops,Rubs or new Murmurs, No Parasternal Heave +ve B.Sounds, Abd Soft, No tenderness, No rebound - guarding or rigidity. No Cyanosis, Clubbing or edema, No new Rash or bruise      Data Review:    CBC Recent Labs  Lab 06/24/20 2131 06/27/20 1659 06/29/20 1146 06/30/20 0427 07/01/20 0025  WBC 3.9* 3.8* 5.2 5.3 7.1  HGB 13.5 13.2 13.5 12.4 12.6  HCT 43.2 40.6 41.9 39.2 39.1  PLT 147* 186 271  258 304  MCV 87.6 86.2 86.6 87.5 85.0  MCH 27.4 28.0 27.9 27.7 27.4  MCHC 31.3 32.5 32.2 31.6 32.2  RDW 15.2 14.9 14.9 15.0 14.7  LYMPHSABS  --   --  1.0 0.5* 0.5*  MONOABS  --   --  0.4 0.2 0.1  EOSABS  --   --  0.0 0.0 0.0  BASOSABS  --   --  0.0 0.0 0.0    Recent Labs  Lab 06/24/20 2131 06/27/20 1659 06/29/20 1146  06/29/20 2029 06/29/20 2051 06/30/20 0427 07/01/20 0025  NA 136 138 141  --   --  139 138  K 4.1 4.2 4.5  --   --  4.5 4.7  CL 101 105 106  --   --  106 108  CO2 25 22 24   --   --  20* 21*  GLUCOSE 131* 136* 158*  --   --  196* 196*  BUN 41* 49* 46*  --   --  47* 49*  CREATININE 1.77* 1.87* 1.89*  --   --  1.67* 1.49*  CALCIUM 9.0 8.8* 9.0  --   --  8.8* 9.1  AST  --  45* 43*  --   --  35 33  ALT  --  28 24  --   --  21 20  ALKPHOS  --  34* 32*  --   --  34* 33*  BILITOT  --  0.7 0.8  --   --  0.7 0.5  ALBUMIN  --  3.3* 2.9*  --   --  2.6* 2.6*  MG  --   --   --   --   --  2.4 2.5*  CRP  --   --   --  5.2*  --  5.4* 5.3*  DDIMER  --   --   --  0.68*  --  0.87* 0.44  PROCALCITON  --   --   --  0.24  --   --   --   LATICACIDVEN  --   --  1.6  --  2.0* 1.3  --   BNP  --   --   --   --   --  858.0* 1,319.6*    ------------------------------------------------------------------------------------------------------------------ Recent Labs    06/29/20 2029  TRIG 209*    Lab Results  Component Value Date   HGBA1C 7.4 (H) 05/07/2020   ------------------------------------------------------------------------------------------------------------------ No results for input(s): TSH, T4TOTAL, T3FREE, THYROIDAB in the last 72 hours.  Invalid input(s): FREET3  Cardiac Enzymes No results for input(s): CKMB, TROPONINI, MYOGLOBIN in the last 168 hours.  Invalid input(s): CK ------------------------------------------------------------------------------------------------------------------    Component Value Date/Time   BNP 1,319.6 (H) 07/01/2020 0025    Micro Results Recent Results (from the past 240 hour(s))  Urine culture     Status: Abnormal   Collection Time: 06/24/20 10:08 PM   Specimen: Urine, Random  Result Value Ref Range Status   Specimen Description   Final    URINE, RANDOM Performed at [facility], [address], [place], [place] [address]    Special Requests   Final      NONE Performed at [facility], [address] [place], [place] [address]    Culture >=100,000 COLONIES/mL ESCHERICHIA COLI (A)  Final   Report Status 06/27/2020 FINAL  Final   Organism ID, Bacteria ESCHERICHIA COLI (A)  Final      Susceptibility   Escherichia coli - MIC*    AMPICILLIN 8  SENSITIVE Sensitive     CEFAZOLIN <=4 SENSITIVE Sensitive     CEFEPIME <=0.12 SENSITIVE Sensitive     CEFTRIAXONE <=0.25 SENSITIVE Sensitive     CIPROFLOXACIN <=0.25 SENSITIVE Sensitive     GENTAMICIN <=1 SENSITIVE Sensitive     IMIPENEM <=0.25 SENSITIVE Sensitive     NITROFURANTOIN <=16 SENSITIVE Sensitive     TRIMETH/SULFA <=20 SENSITIVE Sensitive     AMPICILLIN/SULBACTAM <=2 SENSITIVE Sensitive     PIP/TAZO <=4 SENSITIVE Sensitive     * >=100,000 COLONIES/mL ESCHERICHIA COLI  Respiratory Panel by RT PCR (Flu A&B, Covid) - Nasopharyngeal Swab     Status: Abnormal   Collection Time: 06/27/20  5:18 PM   Specimen: Nasopharyngeal Swab  Result Value Ref Range Status   SARS Coronavirus 2 by RT PCR POSITIVE (A) NEGATIVE Final    Comment: RESULT CALLED TO, READ BACK BY AND VERIFIED WITH: KENDRICK,J @ 1610 ON 06/27/20 BY JUW (NOTE) SARS-CoV-2 target nucleic acids are DETECTED.  SARS-CoV-2 RNA is generally detectable in upper respiratory specimens  during the acute phase of infection. Positive results are indicative of the presence of the identified virus, but do not rule out bacterial infection or co-infection with other pathogens not detected by the test. Clinical correlation with patient history and other diagnostic information is necessary to determine patient infection status. The expected result is Negative.  Fact Sheet for Patients:  PinkCheek.be  Fact Sheet for Healthcare Providers: GravelBags.it  This test is not yet approved or cleared by the [place] FDA and  has been authorized for detection and/or diagnosis of  SARS-CoV-2 by FDA under an Emergency Use Authorization (EUA).  This EUA will remain in effect (meaning this test can b e used) for the duration of  the COVID-19 declaration under Section 564(b)(1) of the Act, 21 U.S.C. section 360bbb-3(b)(1), unless the authorization is terminated or revoked sooner.      Influenza A by PCR NEGATIVE NEGATIVE Final   Influenza B by PCR NEGATIVE NEGATIVE Final    Comment: (NOTE) The Xpert Xpress SARS-CoV-2/FLU/RSV assay is intended as an aid in  the diagnosis of influenza from Nasopharyngeal swab specimens and  should not be used as a sole basis for treatment. Nasal washings and  aspirates are unacceptable for Xpert Xpress SARS-CoV-2/FLU/RSV  testing.  Fact Sheet for Patients: PinkCheek.be  Fact Sheet for Healthcare Providers: GravelBags.it  This test is not yet approved or cleared by the [place] FDA and  has been authorized for detection and/or diagnosis of SARS-CoV-2 by  FDA under an Emergency Use Authorization (EUA). This EUA will remain  in effect (meaning this test can be used) for the duration of the  Covid-19 declaration under Section 564(b)(1) of the Act, 21  U.S.C. section 360bbb-3(b)(1), unless the authorization is  terminated or revoked. Performed at [facility], [address], [place], [place] [address]   Blood Culture (routine x 2)     Status: None (Preliminary result)   Collection Time: 06/29/20  8:15 PM   Specimen: BLOOD  Result Value Ref Range Status   Specimen Description BLOOD RIGHT ARM  Final   Special Requests   Final    BOTTLES DRAWN AEROBIC AND ANAEROBIC Blood Culture results may not be optimal due to an excessive volume of blood received in culture bottles   Culture   Final    NO GROWTH 2 DAYS Performed at [facility], [place] [address], [place], [place] [address]    Report Status PENDING  Incomplete  Blood Culture (routine x 2)     Status: None  (Preliminary result)   Collection Time: 06/29/20  8:29 PM   Specimen: BLOOD RIGHT HAND  Result Value Ref Range Status   Specimen Description BLOOD RIGHT HAND  Final   Special Requests   Final    BOTTLES DRAWN AEROBIC ONLY Blood Culture adequate volume   Culture   Final    NO GROWTH 2 DAYS Performed at [facility], [address], [place], [place] [address]    Report Status PENDING  Incomplete    Radiology Reports US RENAL  Result Date: 06/14/2020 CLINICAL DATA:  Chronic renal disease. EXAM: RENAL / URINARY TRACT ULTRASOUND COMPLETE COMPARISON:  None. FINDINGS: Right Kidney: Renal measurements: 11.5 x 4.6 x 4.6 cm = volume: 127 mL. Increased cortical echogenicity. Left Kidney: Renal measurements: 10.5 x 5.7 x 4.6 cm = volume: 142 mL. Increased cortical echogenicity. Bladder: Appears normal for degree of bladder distention. Other: None. IMPRESSION: Medical renal disease.  No acute abnormalities noted. Electronically Signed   By: Dorise Bullion III M.D   On: 06/14/2020 11:30   DG Chest Portable 1 View  Result Date: 06/29/2020 CLINICAL DATA:  COVID-19 positive with increased shortness of breath and weakness. EXAM: PORTABLE CHEST 1 VIEW COMPARISON:  06/27/2020 FINDINGS: Lungs are hypoinflated with stable mild bilateral patchy opacification likely viral pneumonia in this known COVID-19 positive patient. No evidence of effusion. Mild stable cardiomegaly. Remainder of the exam is unchanged. IMPRESSION: Stable bilateral patchy airspace process likely viral pneumonia in this known COVID-19 positive patient. Electronically Signed   By: Marin Olp M.D.   On: 06/29/2020 12:09   DG Chest Portable 1 View  Result Date: 06/27/2020 CLINICAL DATA:  Cough EXAM: PORTABLE CHEST 1 VIEW COMPARISON:  December 05, 2019 FINDINGS: The cardiomediastinal silhouette is unchanged in contour. Status post TAVR. LEFT chest AICD. No pleural effusion. No pneumothorax. There are RIGHT greater than LEFT peripheral  predominant heterogeneous opacities. Visualized abdomen is unremarkable. Multilevel degenerative changes of the thoracic spine. IMPRESSION: RIGHT greater than LEFT peripheral predominant heterogeneous opacities, may represent multifocal infection, including COVID-19 pneumonia. Followup PA and lateral chest X-ray is recommended in 3-4 weeks after appropriate treatment to ensure resolution and exclude underlying malignancy. Electronically Signed   By: Valentino Saxon MD   On: 06/27/2020 14:57

## 2020-07-01 NOTE — Progress Notes (Signed)
Inpatient Diabetes Program Recommendations  AACE/ADA: New Consensus Statement on Inpatient Glycemic Control   Target Ranges:  Prepandial:   less than 140 mg/dL      Peak postprandial:   less than 180 mg/dL (1-2 hours)      Critically ill patients:  140 - 180 mg/dL   Results for [name], [name] (MRN [id number]) as of 07/01/2020 09:06  Ref. Range 06/30/2020 07:22 06/30/2020 10:02 06/30/2020 11:46 06/30/2020 13:17 06/30/2020 17:59 06/30/2020 22:21 07/01/2020 07:50  Glucose-Capillary Latest Ref Range: 70 - 99 mg/dL 212 (H) 216 (H) 206 (H) 205 (H) 234 (H) 219 (H) 190 (H)   Review of Glycemic Control  Diabetes history: DM2 Outpatient Diabetes medications: Glipizide XL 2.5 mg QAM, 70/30 12-15 units BID Current orders for Inpatient glycemic control: Lantus 20 units daily, Novolog 0-9 units TID with meals, Novolog 0-5 units QHS; Solumedrol 40 mg Q12H  Inpatient Diabetes Program Recommendations:    Insulin: If steroids are continued, please consider ordering Novolog 4 units TID with meals for meal coverage if patient eats at least 50% of meals.  Thanks, Barnie Alderman, RN, MSN, CDE Diabetes Coordinator Inpatient Diabetes Program [phone] (Team Pager from 8am to 5pm)

## 2020-07-01 NOTE — TOC Initial Note (Signed)
Transition of Care [facility]) - Initial/Assessment Note    Patient Details  Name: [name] [name] MRN: [id number] Date of Birth: 1946-05-24  Transition of Care [facility] CM/SW Contact:    Benard Halsted, LCSW Phone Number: 07/01/2020, 2:20 PM  Clinical Narrative:                 CSW received consult for possible home health services at time of discharge with a request to contact patient's son and daughter in law [name] [phone]). CSW spoke with [name] regarding PT recommendation of [facility] at time of discharge. Patient reported that he would like home health services since he and his wife also have COVID. CSW explained home health frequency and he confirmed that was fine and that his mom had been able to walk around the hall today. CSW discussed equipment needs and he reported she has a cane, walker, and shower seat. CSW confirmed PCP and address but patient will be going to Curtis's house (behind her house) at discharge: [address] [place], [place] [address]. He requested PTAR for patient if insurance would give an approval. CSW submitted referral to [facility] for review.    Expected Discharge Plan: [place] Barriers to Discharge: Continued Medical Work up   Patient Goals and CMS Choice Patient states their goals for this hospitalization and ongoing recovery are:: Ret CMS Medicare.gov Compare Post Acute Care list provided to:: Patient Represenative (must comment) Choice offered to / list presented to : Patient, Adult Children  Expected Discharge Plan and Services Expected Discharge Plan: [place]   Discharge Planning Services: CM Consult Post Acute Care Choice: [place] arrangements for the past 2 months: Single Family Home                                      Prior Living Arrangements/Services Living arrangements for the past 2 months: Single Family Home Lives with:: Self, Adult Children Patient language and need for  interpreter reviewed:: Yes Do you feel safe going back to the place where you live?: Yes      Need for Family Participation in Patient Care: Yes (Comment) Care giver support system in place?: Yes (comment) Current home services: DME (Walker, cane) Criminal Activity/Legal Involvement Pertinent to Current Situation/Hospitalization: No - Comment as needed  Activities of Daily Living Home Assistive Devices/Equipment: Environmental consultant (specify type), Cane (specify quad or straight) ADL Screening (condition at time of admission) Patient's cognitive ability adequate to safely complete daily activities?: Yes Is the patient deaf or have difficulty hearing?: No Does the patient have difficulty seeing, even when wearing glasses/contacts?: No Does the patient have difficulty concentrating, remembering, or making decisions?: No Patient able to express need for assistance with ADLs?: No Does the patient have difficulty dressing or bathing?: No Independently performs ADLs?: Yes (appropriate for developmental age) Does the patient have difficulty walking or climbing stairs?: No Weakness of Legs: None Weakness of Arms/Hands: None  Permission Sought/Granted Permission sought to share information with : Facility Sport and exercise psychologist, Family Supports Permission granted to share information with : Yes, Verbal Permission Granted  Share Information with NAME: [name]  Permission granted to share info w AGENCY: [place] granted to share info w Relationship: Son  Permission granted to share info w Contact Information: [phone]  Emotional Assessment   Attitude/Demeanor/Rapport: Unable to Assess Affect (typically observed): Unable to Assess Orientation: :  (  WDL) Alcohol / Substance Use: Not Applicable Psych Involvement: No (comment)  Admission diagnosis:  Hypoxia [R09.02] Acute hypoxemic respiratory failure due to COVID-19 ([place]) [U07.1, J96.01] COVID-19 [U07.1] Patient Active Problem List   Diagnosis  Date Noted  . Acute hypoxemic respiratory failure due to COVID-19 ([place]) 06/29/2020  . CKD (chronic kidney disease), stage IV ([place]) 06/29/2020  . Hypertension associated with diabetes ([place]) 06/29/2020  . Hyperlipidemia associated with type 2 diabetes mellitus ([place]) 06/29/2020  . Generalized osteoarthritis 07/26/2019  . Osteoarthritis of left knee 03/16/2017  . 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 Severe aortic valve stenosis 03/24/2015  . Hyperlipidemia LDL goal <100 05/24/2014  . Severe aortic stenosis 05/23/2014  . Incidental pulmonary nodule, > 66m and < 878m06/29/2015  . Chronic combined systolic and diastolic CHF (congestive heart failure) ([facility]  . LBBB (left bundle branch block) 12/03/2013  . Type 2 DM with CKD stage 4 and hypertension ([facility]10/02/2013  . Seasonal allergies 02/22/2012  . Essential hypertension, benign   . Nonischemic cardiomyopathy ([facility]03/08/2011  . Type 2 diabetes mellitus with nephropathy ([facility]03/06/2008  . Overweight (BMI 25.0-29.9) 10/24/2007   PCP:  SiFayrene HelperMD [id number]: [phone]: [phone]   Social Determinants of Health (SDOH) Interventions    Readmission Risk Interventions No flowsheet data found.

## 2020-07-02 DIAGNOSIS — U071 COVID-19: Secondary | ICD-10-CM | POA: Diagnosis not present

## 2020-07-02 DIAGNOSIS — I5042 Chronic combined systolic (congestive) and diastolic (congestive) heart failure: Secondary | ICD-10-CM | POA: Diagnosis not present

## 2020-07-02 DIAGNOSIS — J9601 Acute respiratory failure with hypoxia: Secondary | ICD-10-CM | POA: Diagnosis not present

## 2020-07-02 LAB — CBC WITH DIFFERENTIAL/PLATELET
Abs Immature Granulocytes: 0.13 10*3/uL — ABNORMAL HIGH (ref 0.00–0.07)
Basophils Absolute: 0 10*3/uL (ref 0.0–0.1)
Basophils Relative: 0 %
Eosinophils Absolute: 0 10*3/uL (ref 0.0–0.5)
Eosinophils Relative: 0 %
HCT: 38.8 % (ref 36.0–46.0)
Hemoglobin: 12.5 g/dL (ref 12.0–15.0)
Immature Granulocytes: 1 %
Lymphocytes Relative: 6 %
Lymphs Abs: 0.6 10*3/uL — ABNORMAL LOW (ref 0.7–4.0)
MCH: 27.2 pg (ref 26.0–34.0)
MCHC: 32.2 g/dL (ref 30.0–36.0)
MCV: 84.5 fL (ref 80.0–100.0)
Monocytes Absolute: 0.4 10*3/uL (ref 0.1–1.0)
Monocytes Relative: 4 %
Neutro Abs: 9.6 10*3/uL — ABNORMAL HIGH (ref 1.7–7.7)
Neutrophils Relative %: 89 %
Platelets: 335 10*3/uL (ref 150–400)
RBC: 4.59 MIL/uL (ref 3.87–5.11)
RDW: 14.7 % (ref 11.5–15.5)
WBC: 10.8 10*3/uL — ABNORMAL HIGH (ref 4.0–10.5)
nRBC: 0 % (ref 0.0–0.2)

## 2020-07-02 LAB — COMPREHENSIVE METABOLIC PANEL
ALT: 18 U/L (ref 0–44)
AST: 25 U/L (ref 15–41)
Albumin: 2.5 g/dL — ABNORMAL LOW (ref 3.5–5.0)
Alkaline Phosphatase: 32 U/L — ABNORMAL LOW (ref 38–126)
Anion gap: 13 (ref 5–15)
BUN: 54 mg/dL — ABNORMAL HIGH (ref 8–23)
CO2: 18 mmol/L — ABNORMAL LOW (ref 22–32)
Calcium: 9 mg/dL (ref 8.9–10.3)
Chloride: 105 mmol/L (ref 98–111)
Creatinine, Ser: 1.71 mg/dL — ABNORMAL HIGH (ref 0.44–1.00)
GFR, Estimated: 31 mL/min — ABNORMAL LOW (ref >60)
Glucose, Bld: 205 mg/dL — ABNORMAL HIGH (ref 70–99)
Potassium: 4.7 mmol/L (ref 3.5–5.1)
Sodium: 136 mmol/L (ref 135–145)
Total Bilirubin: 0.7 mg/dL (ref 0.3–1.2)
Total Protein: 6.1 g/dL — ABNORMAL LOW (ref 6.5–8.1)

## 2020-07-02 LAB — GLUCOSE, CAPILLARY
Glucose-Capillary: 226 mg/dL — ABNORMAL HIGH (ref 70–99)
Glucose-Capillary: 293 mg/dL — ABNORMAL HIGH (ref 70–99)
Glucose-Capillary: 261 mg/dL — ABNORMAL HIGH (ref 70–99)
Glucose-Capillary: 251 mg/dL — ABNORMAL HIGH (ref 70–99)

## 2020-07-02 LAB — MAGNESIUM: Magnesium: 2.7 mg/dL — ABNORMAL HIGH (ref 1.7–2.4)

## 2020-07-02 LAB — C-REACTIVE PROTEIN: CRP: 2.2 mg/dL — ABNORMAL HIGH (ref <1.0)

## 2020-07-02 LAB — BRAIN NATRIURETIC PEPTIDE: B Natriuretic Peptide: 1216.4 pg/mL — ABNORMAL HIGH (ref 0.0–100.0)

## 2020-07-02 LAB — D-DIMER, QUANTITATIVE: D-Dimer, Quant: 0.71 ug/mL-FEU — ABNORMAL HIGH (ref 0.00–0.50)

## 2020-07-02 NOTE — Progress Notes (Signed)
Inpatient Diabetes Program Recommendations  AACE/ADA: New Consensus Statement on Inpatient Glycemic Control   Target Ranges:  Prepandial:   less than 140 mg/dL      Peak postprandial:   less than 180 mg/dL (1-2 hours)      Critically ill patients:  140 - 180 mg/dL   Results for [name] [name], [name] [name] (MRN [id number]) as of 07/02/2020 10:16  Ref. Range 07/01/2020 07:50 07/01/2020 11:40 07/01/2020 16:40 07/01/2020 22:11 07/02/2020 07:45  Glucose-Capillary Latest Ref Range: 70 - 99 mg/dL 190 (H) 304 (H) 230 (H) 231 (H) 226 (H)   Review of Glycemic Control  Diabetes history: DM2 Outpatient Diabetes medications: Glipizide XL 2.5 mg QAM, 70/30 12-15 units BID Current orders for Inpatient glycemic control: Lantus 24 units daily, Novolog 0-9 units TID with meals, Novolog 0-5 units QHS; Solumedrol 40 mg Q12H  Inpatient Diabetes Program Recommendations:    Insulin: Post prandial glucose is consistently elevated. If steroids are continued, please consider ordering Novolog 4 units TID with meals for meal coverage if patient eats at least 50% of meals.  Thanks, Barnie Alderman, RN, MSN, CDE Diabetes Coordinator Inpatient Diabetes Program [phone] (Team Pager from 8am to 5pm)

## 2020-07-02 NOTE — Progress Notes (Signed)
SATURATION QUALIFICATIONS: (This note is used to comply with regulatory documentation for home oxygen)  Patient Saturations on Room Air at Rest = 92%  Patient Saturations on Room Air while Ambulating = 85%  Patient Saturations on 2 Liters of oxygen while Ambulating = 87%  Please briefly explain why patient needs home oxygen: for ambulation.

## 2020-07-02 NOTE — TOC Progression Note (Signed)
Transition of Care [facility]) - Progression Note    Patient Details  Name: [name] [name] MRN: [id number] Date of Birth: 06/28/1946  Transition of Care [facility]) CM/SW [place], LCSW Phone Number: 07/02/2020, 4:57 PM  Clinical Narrative:    [employer] approval received for PTAR transport home at discharge: [phone].   Expected Discharge Plan: [place] Barriers to Discharge: Continued Medical Work up  Expected Discharge Plan and Services Expected Discharge Plan: [place]   Discharge Planning Services: CM Consult Post Acute Care Choice: [place] arrangements for the past 2 months: Single Family Home                 DME Arranged: Oxygen DME Agency: [employer] Date DME Agency Contacted: 07/02/20 Time DME Agency Contacted: [phone] Representative spoke with at DME Agency: [place] ([place]) Interventions    Readmission Risk Interventions No flowsheet data found.

## 2020-07-02 NOTE — Progress Notes (Signed)
PROGRESS NOTE                                                                                                                                                                                                             Patient Demographics:    [name] [name], is a 74 y.o. female, DOB - 21-Feb-1946, NLG:[id number]  Outpatient Primary MD for the patient is Fayrene Helper, MD    LOS - 3  Admit date - 06/29/2020    Chief Complaint  Patient presents with  . Covid Positive       Brief Narrative (HPI from H&P)   BRISEYDA FEHR is a 74 y.o. 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  She was diagnosed with COVID-19 pneumonia and admitted to the hospital.  She is unfortunately not vaccinated.   Subjective:    [name] [name] today to report generalized weakness and fatigue, reports dyspnea with activity, no chest pain, no fever, reports appetite has improved    Assessment  & Plan :    Acute Covid 19 Viral Pneumonitis during the ongoing 2020 Covid 19 Pandemic  - she is unfortunately not vaccinated and so far has incurred mild to moderate parenchymal lung injury -Continue with steroids -Continue with remdesiv -  Monitor inflammatory markers closely.   - If needed she has consented for Actemra use.  Currently stable on room air.  Please note patient seems to be on chronic steroids at home. She has been doing well on 0 to 2 L oxygen.  Encouraged the patient to sit up in chair in the daytime use I-S and flutter valve for pulmonary toiletry and then prone in bed when at night.  Will advance activity and titrate down oxygen as possible.  Actemra/Baricitinib  off label use - patient was told that if COVID-19  pneumonitis gets worse we might potentially use Actemra off label, patient denies any known history of active diverticulitis, tuberculosis or hepatitis, understands the risks and benefits and wants to proceed with Actemra treatment if required.   SpO2: 97 % O2 Flow Rate (L/min): 2 L/min   Recent Labs  Lab 06/27/20 1659 06/27/20 1718 06/29/20 1146 06/29/20 2029 06/29/20 2051 06/30/20 0427 07/01/20 0025 07/02/20 0148  WBC 3.8*  --  5.2  --   --  5.3 7.1 10.8*  HGB 13.2  --  13.5  --   --  12.4 12.6 12.5  HCT 40.6  --  41.9  --   --  39.2 39.1 38.8  PLT 186  --  271  --   --  258 304 335  CRP  --   --   --  5.2*  --  5.4* 5.3* 2.2*  BNP  --   --   --   --   --  858.0* 1,319.6* 1,216.4*  DDIMER  --   --   --  0.68*  --  0.87* 0.44 0.71*  PROCALCITON  --   --   --  0.24  --   --   --   --   AST 45*  --  43*  --   --  35 33 25  ALT 28  --  24  --   --  21 20 18   ALKPHOS 34*  --  32*  --   --  34* 33* 32*  BILITOT 0.7  --  0.8  --   --  0.7 0.5 0.7  ALBUMIN 3.3*  --  2.9*  --   --  2.6* 2.6* 2.5*  LATICACIDVEN  --   --  1.6  --  2.0* 1.3  --   --   SARSCOV2NAA  --  POSITIVE*  --   --   --   --   --   --     Chronic combined systolic and diastolic heart failure.  EF 25% on recent echocardiogram.  S/p AICD placement.   -Currently blood pressure on the lower side hence have dropped home dose Coreg to 6.25 twice daily from 25 twice daily. -Giving tenuous blood pressure, Lasix and Aldactone has been dosed on as-needed basis, no evidence of volume overload today, she received IV Lasix yesterday.  - Renal function precludes the use of ACE/ARB or Entresto.  Aortic stenosis s/p TAVR placement.   - stable  CKD stage IV.  Baseline creatinine around 1.5-1.7.   -Monitor.  Hypertension.   -Blood pressure medications adjusted.  Currently on moderate dose Coreg, Lasix and Aldactone to be dosed lately.  Dyslipidemia.   -Continue statin and Zetia.  DM type II.   -Currently on sliding scale,  oral hypoglycemics held, has some high readings, will increase her Lantus to 24 units daily.  Lab Results  Component Value Date   HGBA1C 7.4 (H) 05/07/2020   CBG (last 3)  Recent Labs    07/01/20 2211 07/02/20 0745 07/02/20 1152  GLUCAP 231* 226* 293*     Condition - Extremely Guarded  Family Communication  :  Son Linton Rump on 06/30/20, 07/02/20 - [phone]  Code Status :  Full  Consults  :  None  Procedures  :  None  PUD Prophylaxis : None  Disposition Plan  :    Status is: Inpatient  Remains inpatient appropriate because:IV treatments appropriate due to intensity of illness or inability to take PO   Dispo: The patient is from: Home              Anticipated d/c is to: Home              Anticipated d/c date is: 2 days  Patient currently is not medically stable to d/c.   DVT Prophylaxis  :  Heparin   Lab Results  Component Value Date   PLT 335 07/02/2020    Diet :  Diet Order            Diet heart healthy/carb modified Room service appropriate? Yes; Fluid consistency: Thin  Diet effective now                  Inpatient Medications  Scheduled Meds: . vitamin C  500 mg Oral Daily  . aspirin EC  81 mg Oral q morning - 10a  . carvedilol  6.25 mg Oral BID WC  . ezetimibe  10 mg Oral Daily  . fenofibrate  54 mg Oral Daily  . heparin  5,000 Units Subcutaneous Q8H  . insulin aspart  0-5 Units Subcutaneous QHS  . insulin aspart  0-9 Units Subcutaneous TID WC  . insulin glargine  24 Units Subcutaneous Daily  . methylPREDNISolone (SOLU-MEDROL) injection  40 mg Intravenous Q12H  . pantoprazole  40 mg Oral Daily  . pravastatin  40 mg Oral q1800  . sodium chloride flush  3 mL Intravenous Q12H  . zinc sulfate  220 mg Oral Daily   Continuous Infusions: . remdesivir 100 mg in NS 100 mL 100 mg (07/02/20 0838)   PRN Meds:.acetaminophen, albuterol, chlorpheniramine-HYDROcodone, guaiFENesin-dextromethorphan, [DISCONTINUED] ondansetron **OR**  ondansetron (ZOFRAN) IV  Antibiotics  :    Anti-infectives (From admission, onward)   Start     Dose/Rate Route Frequency Ordered Stop   07/01/20 1000  remdesivir 100 mg in sodium chloride 0.9 % 100 mL IVPB       "Followed by" Linked Group Details   100 mg 200 mL/hr over 30 Minutes Intravenous Daily 06/29/20 2342 07/05/20 0959   06/30/20 0100  remdesivir 200 mg in sodium chloride 0.9% 250 mL IVPB       "Followed by" Linked Group Details   200 mg 580 mL/hr over 30 Minutes Intravenous Once 06/29/20 2342 06/30/20 0358        Emeline Gins Breigh Annett M.D on 07/02/2020 at 12:37 PM  To page go to [email or web address]   [facility] -  Office  [phone]   See all Orders from today for further details    Objective:   Vitals:   07/02/20 0018 07/02/20 0331 07/02/20 0800 07/02/20 0830  BP: 117/70  (!) 125/97   Pulse: 60 65 60   Resp: 19  20   Temp: 98 F (36.7 C) 97.8 F (36.6 C) 97.9 F (36.6 C)   TempSrc: Axillary Oral Oral   SpO2: 98% 94% 90% 97%  Weight:      Height:        Wt Readings from Last 3 Encounters:  07/01/20 78.7 kg  06/24/20 79.8 kg  05/11/20 79.4 kg     Intake/Output Summary (Last 24 hours) at 07/02/2020 1237 Last data filed at 07/02/2020 0400 Gross per 24 hour  Intake 1540 ml  Output --  Net 1540 ml     Physical Exam  Awake Alert, Oriented X 3, No new F.N deficits, Normal affect Symmetrical Chest wall movement, Good air movement bilaterally, CTAB RRR,No Gallops,Rubs or new Murmurs, No Parasternal Heave +ve B.Sounds, Abd Soft, No tenderness, No rebound - guarding or rigidity. No Cyanosis, Clubbing or edema, No new Rash or bruise       Data Review:    CBC Recent Labs  Lab 06/27/20 1659 06/29/20 1146 06/30/20 0427 07/01/20 0025 07/02/20 0148  WBC 3.8* 5.2 5.3 7.1 10.8*  HGB 13.2 13.5 12.4 12.6 12.5  HCT 40.6 41.9 39.2 39.1 38.8  PLT 186 271 258 304 335  MCV 86.2 86.6 87.5 85.0 84.5  MCH 28.0 27.9 27.7 27.4 27.2  MCHC 32.5 32.2 31.6  32.2 32.2  RDW 14.9 14.9 15.0 14.7 14.7  LYMPHSABS  --  1.0 0.5* 0.5* 0.6*  MONOABS  --  0.4 0.2 0.1 0.4  EOSABS  --  0.0 0.0 0.0 0.0  BASOSABS  --  0.0 0.0 0.0 0.0    Recent Labs  Lab 06/27/20 1659 06/29/20 1146 06/29/20 2029 06/29/20 2051 06/30/20 0427 07/01/20 0025 07/02/20 0148  NA 138 141  --   --  139 138 136  K 4.2 4.5  --   --  4.5 4.7 4.7  CL 105 106  --   --  106 108 105  CO2 22 24  --   --  20* 21* 18*  GLUCOSE 136* 158*  --   --  196* 196* 205*  BUN 49* 46*  --   --  47* 49* 54*  CREATININE 1.87* 1.89*  --   --  1.67* 1.49* 1.71*  CALCIUM 8.8* 9.0  --   --  8.8* 9.1 9.0  AST 45* 43*  --   --  35 33 25  ALT 28 24  --   --  21 20 18   ALKPHOS 34* 32*  --   --  34* 33* 32*  BILITOT 0.7 0.8  --   --  0.7 0.5 0.7  ALBUMIN 3.3* 2.9*  --   --  2.6* 2.6* 2.5*  MG  --   --   --   --  2.4 2.5* 2.7*  CRP  --   --  5.2*  --  5.4* 5.3* 2.2*  DDIMER  --   --  0.68*  --  0.87* 0.44 0.71*  PROCALCITON  --   --  0.24  --   --   --   --   LATICACIDVEN  --  1.6  --  2.0* 1.3  --   --   BNP  --   --   --   --  858.0* 1,319.6* 1,216.4*    ------------------------------------------------------------------------------------------------------------------ Recent Labs    06/29/20 2029  TRIG 209*    Lab Results  Component Value Date   HGBA1C 7.4 (H) 05/07/2020   ------------------------------------------------------------------------------------------------------------------ No results for input(s): TSH, T4TOTAL, T3FREE, THYROIDAB in the last 72 hours.  Invalid input(s): FREET3  Cardiac Enzymes No results for input(s): CKMB, TROPONINI, MYOGLOBIN in the last 168 hours.  Invalid input(s): CK ------------------------------------------------------------------------------------------------------------------    Component Value Date/Time   BNP 1,216.4 (H) 07/02/2020 0148    Micro Results Recent Results (from the past 240 hour(s))  Urine culture     Status: Abnormal    Collection Time: 06/24/20 10:08 PM   Specimen: Urine, Random  Result Value Ref Range Status   Specimen Description   Final    URINE, RANDOM Performed at [facility], [address] [place], [place] [address]    Special Requests   Final    NONE Performed at [facility], [address], [place], [place] [address]    Culture >=100,000 COLONIES/mL ESCHERICHIA COLI (A)  Final   Report Status 06/27/2020 FINAL  Final   Organism ID, Bacteria ESCHERICHIA COLI (A)  Final      Susceptibility   Escherichia coli - MIC*    AMPICILLIN 8 SENSITIVE Sensitive  CEFAZOLIN <=4 SENSITIVE Sensitive     CEFEPIME <=0.12 SENSITIVE Sensitive     CEFTRIAXONE <=0.25 SENSITIVE Sensitive     CIPROFLOXACIN <=0.25 SENSITIVE Sensitive     GENTAMICIN <=1 SENSITIVE Sensitive     IMIPENEM <=0.25 SENSITIVE Sensitive     NITROFURANTOIN <=16 SENSITIVE Sensitive     TRIMETH/SULFA <=20 SENSITIVE Sensitive     AMPICILLIN/SULBACTAM <=2 SENSITIVE Sensitive     PIP/TAZO <=4 SENSITIVE Sensitive     * >=100,000 COLONIES/mL ESCHERICHIA COLI  Respiratory Panel by RT PCR (Flu A&B, Covid) - Nasopharyngeal Swab     Status: Abnormal   Collection Time: 06/27/20  5:18 PM   Specimen: Nasopharyngeal Swab  Result Value Ref Range Status   SARS Coronavirus 2 by RT PCR POSITIVE (A) NEGATIVE Final    Comment: RESULT CALLED TO, READ BACK BY AND VERIFIED WITH: KENDRICK,J @ 7782 ON 06/27/20 BY JUW (NOTE) SARS-CoV-2 target nucleic acids are DETECTED.  SARS-CoV-2 RNA is generally detectable in upper respiratory specimens  during the acute phase of infection. Positive results are indicative of the presence of the identified virus, but do not rule out bacterial infection or co-infection with other pathogens not detected by the test. Clinical correlation with patient history and other diagnostic information is necessary to determine patient infection status. The expected result is Negative.  Fact Sheet for Patients:    PinkCheek.be  Fact Sheet for Healthcare Providers: GravelBags.it  This test is not yet approved or cleared by the [place] FDA and  has been authorized for detection and/or diagnosis of SARS-CoV-2 by FDA under an Emergency Use Authorization (EUA).  This EUA will remain in effect (meaning this test can b e used) for the duration of  the COVID-19 declaration under Section 564(b)(1) of the Act, 21 U.S.C. section 360bbb-3(b)(1), unless the authorization is terminated or revoked sooner.      Influenza A by PCR NEGATIVE NEGATIVE Final   Influenza B by PCR NEGATIVE NEGATIVE Final    Comment: (NOTE) The Xpert Xpress SARS-CoV-2/FLU/RSV assay is intended as an aid in  the diagnosis of influenza from Nasopharyngeal swab specimens and  should not be used as a sole basis for treatment. Nasal washings and  aspirates are unacceptable for Xpert Xpress SARS-CoV-2/FLU/RSV  testing.  Fact Sheet for Patients: PinkCheek.be  Fact Sheet for Healthcare Providers: GravelBags.it  This test is not yet approved or cleared by the [place] FDA and  has been authorized for detection and/or diagnosis of SARS-CoV-2 by  FDA under an Emergency Use Authorization (EUA). This EUA will remain  in effect (meaning this test can be used) for the duration of the  Covid-19 declaration under Section 564(b)(1) of the Act, 21  U.S.C. section 360bbb-3(b)(1), unless the authorization is  terminated or revoked. Performed at [facility], [address] [place], [place] [address]   Blood Culture (routine x 2)     Status: None (Preliminary result)   Collection Time: 06/29/20  8:15 PM   Specimen: BLOOD  Result Value Ref Range Status   Specimen Description BLOOD RIGHT ARM  Final   Special Requests   Final    BOTTLES DRAWN AEROBIC AND ANAEROBIC Blood Culture results may not be optimal due to an  excessive volume of blood received in culture bottles   Culture   Final    NO GROWTH 3 DAYS Performed at [facility], [address] [place], [place] [address]    Report Status PENDING  Incomplete  Blood Culture (routine x  2)     Status: None (Preliminary result)   Collection Time: 06/29/20  8:29 PM   Specimen: BLOOD RIGHT HAND  Result Value Ref Range Status   Specimen Description BLOOD RIGHT HAND  Final   Special Requests   Final    BOTTLES DRAWN AEROBIC ONLY Blood Culture adequate volume   Culture   Final    NO GROWTH 3 DAYS Performed at [facility], [address] [place], [place] [address]    Report Status PENDING  Incomplete    Radiology Reports US RENAL  Result Date: 06/14/2020 CLINICAL DATA:  Chronic renal disease. EXAM: RENAL / URINARY TRACT ULTRASOUND COMPLETE COMPARISON:  None. FINDINGS: Right Kidney: Renal measurements: 11.5 x 4.6 x 4.6 cm = volume: 127 mL. Increased cortical echogenicity. Left Kidney: Renal measurements: 10.5 x 5.7 x 4.6 cm = volume: 142 mL. Increased cortical echogenicity. Bladder: Appears normal for degree of bladder distention. Other: None. IMPRESSION: Medical renal disease.  No acute abnormalities noted. Electronically Signed   By: Dorise Bullion III M.D   On: 06/14/2020 11:30   DG Chest Portable 1 View  Result Date: 06/29/2020 CLINICAL DATA:  COVID-19 positive with increased shortness of breath and weakness. EXAM: PORTABLE CHEST 1 VIEW COMPARISON:  06/27/2020 FINDINGS: Lungs are hypoinflated with stable mild bilateral patchy opacification likely viral pneumonia in this known COVID-19 positive patient. No evidence of effusion. Mild stable cardiomegaly. Remainder of the exam is unchanged. IMPRESSION: Stable bilateral patchy airspace process likely viral pneumonia in this known COVID-19 positive patient. Electronically Signed   By: Marin Olp M.D.   On: 06/29/2020 12:09   DG Chest Portable 1 View  Result Date: 06/27/2020 CLINICAL  DATA:  Cough EXAM: PORTABLE CHEST 1 VIEW COMPARISON:  December 05, 2019 FINDINGS: The cardiomediastinal silhouette is unchanged in contour. Status post TAVR. LEFT chest AICD. No pleural effusion. No pneumothorax. There are RIGHT greater than LEFT peripheral predominant heterogeneous opacities. Visualized abdomen is unremarkable. Multilevel degenerative changes of the thoracic spine. IMPRESSION: RIGHT greater than LEFT peripheral predominant heterogeneous opacities, may represent multifocal infection, including COVID-19 pneumonia. Followup PA and lateral chest X-ray is recommended in 3-4 weeks after appropriate treatment to ensure resolution and exclude underlying malignancy. Electronically Signed   By: Valentino Saxon MD   On: 06/27/2020 14:57

## 2020-07-02 NOTE — Progress Notes (Signed)
SATURATION QUALIFICATIONS: (This note is used to comply with regulatory documentation for home oxygen)  Patient Saturations on Room Air at Rest = 97%  Patient Saturations on Room Air while Ambulating = 85%  Patient Saturations on 2 Liters of oxygen while Ambulating = 90%  Please briefly explain why patient needs home oxygen:  To maintain oxygen saturation >87% during functional activities.   Arby Barrette, PT Pager [phone]

## 2020-07-02 NOTE — TOC Progression Note (Addendum)
Transition of Care [facility]) - Progression Note    Patient Details  Name: [name] [name] MRN: [id number] Date of Birth: [birth year]  Transition of Care [facility]) CM/SW [place], RN Phone Number: 07/02/2020, 2:05 PM  Clinical Narrative:    Readmit for Shortness of breath, known COVID . Hypoxia. Oxygen at 2L  PT recommending Home health. CSW arranged PTAR for transport home. Patient will be going to different address upon discharge [address] [place], [place]   Junie Panning from [facility]  asked about [facility] PT for this patient. None  Available, tried [facility], interim, not available staff. Spoke to [employer], may not have available staff until after thanksgiving, but will send in for approval and will give me a call back.   Expected Discharge Plan: [place] Barriers to Discharge: Continued Medical Work up  Expected Discharge Plan and Services Expected Discharge Plan: [place]   Discharge Planning Services: CM Consult Post Acute Care Choice: [place] arrangements for the past 2 months: Single Family Home                 DME Arranged: Oxygen DME Agency: [employer] Date DME Agency Contacted: 07/02/20 Time DME Agency Contacted: [phone] Representative spoke with at DME Agency: [place] ([place]) Interventions    Readmission Risk Interventions No flowsheet data found.

## 2020-07-02 NOTE — Progress Notes (Signed)
Physical Therapy Treatment Patient Details Name: [name] [name] MRN: [id number] DOB: Aug 31, 1945 Today's Date: 07/02/2020    History of Present Illness 74 y.o. 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 +COVID and discharged home. Returned to ED 11/15 by EMS for shortness of breath.     PT Comments    Patient assessed on room air and sats did drop to 85% with ambulating 50 ft. (see separate saturation note). Issued and educated on use of incentive spirometer with pt only able to pull 500 ml at proper velocity (if she breathes faster than recommended, she briefly achieves 750 ml). States her family is trying to figure out how to have someone with her at all times. Discussed that she may go home on O2 and will have the long O2 tubing to maneuver with and increases her risk of trip/fall.      Follow Up Recommendations  Home health PT;Supervision for mobility/OOB (and HHOT; supervision due to likely new home O2 on dc)     Equipment Recommendations  None recommended by PT    Recommendations for Other Services       Precautions / Restrictions Precautions Precautions: Fall Restrictions Weight Bearing Restrictions: No    Mobility  Bed Mobility Overal bed mobility: Needs Assistance Bed Mobility: Rolling;Sidelying to Sit;Sit to Supine Rolling: Modified independent (Device/Increase time) (with rail) Sidelying to sit: Supervision (for lines; with rail)   Sit to supine: Supervision (for lines)   General bed mobility comments: up to EOB and pulse ox stopped registering; switched for new probe, switched ears, still not working and allowed pt to return to supine while awaiting RNs assistance; up to EOB a second time  Transfers Overall transfer level: Needs assistance Equipment used: 4-wheeled walker Transfers: Sit  to/from Stand Sit to Stand: Min guard         General transfer comment: vc for safe use of brakes and sequencing  Ambulation/Gait Ambulation/Gait assistance: Min guard Gait Distance (Feet): 50 Feet Assistive device: 4-wheeled walker Gait Pattern/deviations: Step-through pattern;Trunk flexed;Decreased stride length Gait velocity: appropriately decr due to navigating in small spaces and due to fatigue, ?O2 levels       Stairs             Wheelchair Mobility    Modified Rankin (Stroke Patients Only)       Balance Overall balance assessment: No apparent balance deficits (not formally assessed) (pt uses rollator due to bil knee pain)                                          Cognition Arousal/Alertness: Awake/alert Behavior During Therapy: Flat affect Overall Cognitive Status: Within Functional Limits for tasks assessed                                 General Comments: reports very fatigued      Exercises Other Exercises Other Exercises: vc for pursed lip breathing to recover from lower sats Other Exercises: educated in use of and frequency for IS; x6 reps during education    General Comments General comments (skin integrity, edema, etc.): RN in to further assess inability to get pleth to  work; appears to be issue with cable; brought in dinamap for monitoring during activity      Pertinent Vitals/Pain Pain Assessment: No/denies pain    Home Living                      Prior Function            PT Goals (current goals can now be found in the care plan section) Acute Rehab PT Goals Patient Stated Goal: feel better and go home Time For Goal Achievement: 07/14/20 Potential to Achieve Goals: Good Progress towards PT goals: Progressing toward goals    Frequency    Min 3X/week      PT Plan Current plan remains appropriate;Frequency needs to be updated    Co-evaluation              AM-PAC PT "6  Clicks" Mobility   Outcome Measure  Help needed turning from your back to your side while in a flat bed without using bedrails?: None Help needed moving from lying on your back to sitting on the side of a flat bed without using bedrails?: A Little Help needed moving to and from a bed to a chair (including a wheelchair)?: A Little Help needed standing up from a chair using your arms (e.g., wheelchair or bedside chair)?: None Help needed to walk in hospital room?: A Little Help needed climbing 3-5 steps with a railing? : A Little 6 Click Score: 20    End of Session Equipment Utilized During Treatment: Oxygen Activity Tolerance: Patient limited by fatigue Patient left: in chair;with call bell/phone within reach Nurse Communication: Mobility status;Other (comment) (please order flutter valve) PT Visit Diagnosis: Muscle weakness (generalized) (M62.81);Difficulty in walking, not elsewhere classified (R26.2);Pain Pain - Right/Left:  (bil) Pain - part of body: Knee     Time: 0920-1023 PT Time Calculation (min) (ACUTE ONLY): 63 min  Charges:  $Gait Training: 8-22 mins $Therapeutic Exercise: 8-22 mins $Therapeutic Activity: 8-22 mins                      Arby Barrette, PT Pager [phone]    Rexanne Mano 07/02/2020, 11:18 AM

## 2020-07-03 DIAGNOSIS — N184 Chronic kidney disease, stage 4 (severe): Secondary | ICD-10-CM

## 2020-07-03 DIAGNOSIS — J9601 Acute respiratory failure with hypoxia: Secondary | ICD-10-CM | POA: Diagnosis not present

## 2020-07-03 DIAGNOSIS — U071 COVID-19: Secondary | ICD-10-CM | POA: Diagnosis not present

## 2020-07-03 DIAGNOSIS — I5042 Chronic combined systolic (congestive) and diastolic (congestive) heart failure: Secondary | ICD-10-CM | POA: Diagnosis not present

## 2020-07-03 LAB — COMPREHENSIVE METABOLIC PANEL
ALT: 19 U/L (ref 0–44)
AST: 29 U/L (ref 15–41)
Albumin: 2.6 g/dL — ABNORMAL LOW (ref 3.5–5.0)
Alkaline Phosphatase: 37 U/L — ABNORMAL LOW (ref 38–126)
Anion gap: 9 (ref 5–15)
BUN: 47 mg/dL — ABNORMAL HIGH (ref 8–23)
CO2: 20 mmol/L — ABNORMAL LOW (ref 22–32)
Calcium: 9.1 mg/dL (ref 8.9–10.3)
Chloride: 108 mmol/L (ref 98–111)
Creatinine, Ser: 1.58 mg/dL — ABNORMAL HIGH (ref 0.44–1.00)
GFR, Estimated: 34 mL/min — ABNORMAL LOW (ref >60)
Glucose, Bld: 175 mg/dL — ABNORMAL HIGH (ref 70–99)
Potassium: 4.7 mmol/L (ref 3.5–5.1)
Sodium: 137 mmol/L (ref 135–145)
Total Bilirubin: 0.8 mg/dL (ref 0.3–1.2)
Total Protein: 6.1 g/dL — ABNORMAL LOW (ref 6.5–8.1)

## 2020-07-03 LAB — CBC WITH DIFFERENTIAL/PLATELET
Abs Immature Granulocytes: 0.21 10*3/uL — ABNORMAL HIGH (ref 0.00–0.07)
Basophils Absolute: 0 10*3/uL (ref 0.0–0.1)
Basophils Relative: 0 %
Eosinophils Absolute: 0 10*3/uL (ref 0.0–0.5)
Eosinophils Relative: 0 %
HCT: 39.8 % (ref 36.0–46.0)
Hemoglobin: 12.8 g/dL (ref 12.0–15.0)
Immature Granulocytes: 2 %
Lymphocytes Relative: 5 %
Lymphs Abs: 0.6 10*3/uL — ABNORMAL LOW (ref 0.7–4.0)
MCH: 27.5 pg (ref 26.0–34.0)
MCHC: 32.2 g/dL (ref 30.0–36.0)
MCV: 85.4 fL (ref 80.0–100.0)
Monocytes Absolute: 0.5 10*3/uL (ref 0.1–1.0)
Monocytes Relative: 5 %
Neutro Abs: 9.5 10*3/uL — ABNORMAL HIGH (ref 1.7–7.7)
Neutrophils Relative %: 88 %
Platelets: 367 10*3/uL (ref 150–400)
RBC: 4.66 MIL/uL (ref 3.87–5.11)
RDW: 14.8 % (ref 11.5–15.5)
WBC: 10.9 10*3/uL — ABNORMAL HIGH (ref 4.0–10.5)
nRBC: 0 % (ref 0.0–0.2)

## 2020-07-03 LAB — GLUCOSE, CAPILLARY
Glucose-Capillary: 138 mg/dL — ABNORMAL HIGH (ref 70–99)
Glucose-Capillary: 211 mg/dL — ABNORMAL HIGH (ref 70–99)
Glucose-Capillary: 268 mg/dL — ABNORMAL HIGH (ref 70–99)
Glucose-Capillary: 181 mg/dL — ABNORMAL HIGH (ref 70–99)

## 2020-07-03 LAB — MAGNESIUM: Magnesium: 2.6 mg/dL — ABNORMAL HIGH (ref 1.7–2.4)

## 2020-07-03 LAB — D-DIMER, QUANTITATIVE: D-Dimer, Quant: 0.59 ug/mL-FEU — ABNORMAL HIGH (ref 0.00–0.50)

## 2020-07-03 LAB — C-REACTIVE PROTEIN: CRP: 1.6 mg/dL — ABNORMAL HIGH (ref <1.0)

## 2020-07-03 LAB — BRAIN NATRIURETIC PEPTIDE: B Natriuretic Peptide: 1139.5 pg/mL — ABNORMAL HIGH (ref 0.0–100.0)

## 2020-07-03 MED ORDER — GLIPIZIDE ER 2.5 MG PO TB24
2.5000 mg | ORAL_TABLET | Freq: Every day | ORAL | Status: DC
  Administered 2020-07-03 – 2020-07-04 (×2): 2.5 mg via ORAL
  Filled 2020-07-03 (×2): qty 1

## 2020-07-03 NOTE — Evaluation (Signed)
Occupational Therapy Evaluation Patient Details Name: [name] [name] MRN: [id number] DOB: 1946/04/20 Today's Date: 07/03/2020    History of Present Illness 74 y.o. 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 +COVID and discharged home. Returned to ED 11/15 by EMS for shortness of breath.    Clinical Impression   PTA pt living with family and functioning at independent community level. At time of eval, pt completing bed mobility at supervision level and transfers with min guard assist. Pt c/o persisting fatigue this date and moderately irritable during session. Pt completed mobility from recliner chair by window to sink and required seated rest break (on RA at this point with sats low 90s). After rest break, pt then walked into bathroom for toilet transfer with RW. After transfer, pt O2 sats down to 79% on RA and required 2L, then 3L [place] to recover back into mid 90s. Began education on energy conservation and self monitoring O2. Pt returned to bed at end of session. Given current status, recommend HHOT at d/c for continued progression of ADL in home environment. Will continue to follow per POC listed below.   SATURATION QUALIFICATIONS: (This note is used to comply with regulatory documentation for home oxygen)  Patient Saturations on Room Air at Rest = 92%  Patient Saturations on Room Air while Ambulating = 78%  Patient Saturations on 2 Liters of oxygen while Ambulating = 88% (brief period of 82% on 2LO2, placed briefly on 3LO2 but quick recovery >90% so suspect due to inaccurate pleth)  Please briefly explain why patient needs home oxygen: to maintain SpO2 with ADLs    Follow Up Recommendations  Home health OT;Supervision - Intermittent    Equipment Recommendations  3 in 1 bedside commode    Recommendations for  Other Services       Precautions / Restrictions Precautions Precautions: Fall Restrictions Weight Bearing Restrictions: No      Mobility Bed Mobility Overal bed mobility: Needs Assistance         Sit to supine: Supervision   General bed mobility comments: up in chair on arrival; supervision back to supine at end of session    Transfers Overall transfer level: Needs assistance Equipment used: Rolling walker (2 wheeled) Transfers: Sit to/from Stand Sit to Stand: Supervision;Min guard         General transfer comment: for safety, sit to stand with PT x2 from chair after seated rest break and from toilet. Verbal cuing for hand placement and bringing RW with her prior to sitting on destination surface.    Balance Overall balance assessment: Mild deficits observed, not formally tested                                         ADL either performed or assessed with clinical judgement   ADL Overall ADL's : Needs assistance/impaired Eating/Feeding: Set up;Sitting   Grooming: Set up;Sitting   Upper Body Bathing: Minimal assistance;Sitting   Lower Body Bathing: Minimal assistance;Sit to/from stand;Sitting/lateral leans   Upper Body Dressing : Set up;Sitting   Lower Body Dressing: Minimal assistance;Sit to/from stand;Sitting/lateral leans   Toilet Transfer: Min guard;Ambulation;Regular Toilet;RW;Grab bars;Minimal assistance Toilet Transfer Details (indicate cue type and reason): pt mobilized into bathroom for toilet transfer  with increased time and seated rest break on the way due to fatigue. Toileting- Clothing Manipulation and Hygiene: Set up;Sitting/lateral lean Toileting - Clothing Manipulation Details (indicate cue type and reason): anterior peri care     Functional mobility during ADLs: Min guard;Rolling walker General ADL Comments: limited by generalized weakness and poor cardiopulmonary tolerance with ADL     Vision Patient Visual Report: No  change from baseline       Perception     Praxis      Pertinent Vitals/Pain Pain Assessment: No/denies pain     Hand Dominance     Extremity/Trunk Assessment Upper Extremity Assessment Upper Extremity Assessment: Generalized weakness   Lower Extremity Assessment Lower Extremity Assessment: Generalized weakness       Communication Communication Communication: No difficulties   Cognition Arousal/Alertness: Awake/alert Behavior During Therapy: Flat affect Overall Cognitive Status: Within Functional Limits for tasks assessed                                 General Comments: Pt is somewhat irritable with depressed affect, shows little interest in therapy and irriated with questions. At end of session pt apologetic   General Comments       Exercises     Shoulder Instructions      Home Living Family/patient expects to be discharged to:: Private residence Living Arrangements: Other relatives Available Help at Discharge: Family Type of Home: House Home Access: Stairs to enter;Ramped entrance   Entrance Stairs-Rails: [place]: One level     Bathroom Shower/Tub: Teacher, early years/pre: [place]: Environmental consultant - 2 wheels;Walker - 4 wheels;Cane - single point;Tub bench   Additional Comments: reports she will go stay with her son for a few days      Prior Functioning/Environment Level of Independence: Independent with assistive device(s)        Comments: uses rollator; doesn't use tub bench prefers to get down into tub (in/;out on her own); does all shopping, meals, housework herself        OT Problem List: Decreased strength;Decreased knowledge of use of DME or AE;Decreased knowledge of precautions;Decreased activity tolerance;Cardiopulmonary status limiting activity;Impaired balance (sitting and/or standing)      OT Treatment/Interventions: Self-care/ADL training;Therapeutic exercise;Patient/family  education;Balance training;Energy conservation;Therapeutic activities;DME and/or AE instruction    OT Goals(Current goals can be found in the care plan section) Acute Rehab OT Goals Patient Stated Goal: feel better and go home OT Goal Formulation: With patient Time For Goal Achievement: 07/17/20 Potential to Achieve Goals: Good  OT Frequency: Min 2X/week   Barriers to D/C:            Co-evaluation              AM-PAC OT "6 Clicks" Daily Activity     Outcome Measure Help from another person eating meals?: A Little Help from another person taking care of personal grooming?: A Little Help from another person toileting, which includes using toliet, bedpan, or urinal?: A Little Help from another person bathing (including washing, rinsing, drying)?: A Little Help from another person to put on and taking off regular upper body clothing?: A Little Help from another person to put on and taking off regular lower body clothing?: A Little 6 Click Score: 18   End of Session Equipment Utilized During Treatment: Gait belt;Rolling walker Nurse Communication: Mobility status  Activity Tolerance: Patient tolerated treatment well  Patient left: in bed;with call bell/phone within reach  OT Visit Diagnosis: Unsteadiness on feet (R26.81);Other abnormalities of gait and mobility (R26.89);Muscle weakness (generalized) (M62.81)                Time: 9021-1155 OT Time Calculation (min): 15 min Charges:  OT General Charges $OT Visit: 1 Visit OT Evaluation $OT Eval Moderate Complexity: [place], MSOT, OTR/L [facility] Office Number: [phone] Pager: [phone]  Zenovia Jarred 07/03/2020, 5:21 PM

## 2020-07-03 NOTE — TOC Transition Note (Addendum)
Transition of Care [facility]) - CM/SW Discharge Note   Patient Details  Name: [name] [name] MRN: [id number] Date of Birth: 01-08-1946  Transition of Care [facility]) CM/SW Contact:  Verdell Carmine, RN Phone Number: 07/03/2020, 3:44 PM   Clinical Narrative:     Oxygen ordered via [employer], awaiting on Home health PT OT, from [facility]. May not start until after Thanksgiving., but at least will get some PT OT . Other agency services are not available due to staffing, or no coverage for that area.   [place] declined. Will have to be paired for [facility]  Final next level of care: Home w Home Health Services Barriers to Discharge: No Barriers Identified   Patient Goals and CMS Choice Patient states their goals for this hospitalization and ongoing recovery are:: Ret CMS Medicare.gov Compare Post Acute Care list provided to:: Patient Represenative (must comment) Choice offered to / list presented to : Patient, Adult Children  Discharge Placement                       Discharge Plan and Services   Discharge Planning Services: CM Consult Post Acute Care Choice: Home Health          DME Arranged: Oxygen DME Agency: [employer] Date DME Agency Contacted: 07/02/20 Time DME Agency Contacted: [phone] Representative spoke with at DME Agency: [place] ([place]) Interventions     Readmission Risk Interventions No flowsheet data found.

## 2020-07-03 NOTE — Progress Notes (Signed)
Physical Therapy Treatment Patient Details Name: [name] [name] MRN: [id number] DOB: [birth year] Today's Date: 07/03/2020    History of Present Illness 74 y.o. 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 +COVID and discharged home. Returned to ED 11/15 by EMS for shortness of breath.     PT Comments    Pt with depressed and disinterested affect today, but willing to participate to aid in d/c. Pt ambulated room distance with supervision level of assist and required x1 seated rest break to recover DOE 2/4 and fatigue. RA trialled, but pt with O2 desaturation to 78% requiring 2LO2 to maintain SpO2 during mobility. PT to continue to follow acutely.  SATURATION QUALIFICATIONS: (This note is used to comply with regulatory documentation for home oxygen)  Patient Saturations on Room Air at Rest = 92%  Patient Saturations on Room Air while Ambulating = 78%  Patient Saturations on 2 Liters of oxygen while Ambulating = 88% (brief period of 82% on 2LO2, placed briefly on 3LO2 but quick recovery >90% so suspect due to inaccurate pleth)  Please briefly explain why patient needs home oxygen: to maintain SpO2    Follow Up Recommendations  Home health PT;Supervision for mobility/OOB (and HHOT; supervision due to likely new home O2 on dc)     Equipment Recommendations  None recommended by PT    Recommendations for Other Services       Precautions / Restrictions Precautions Precautions: Fall Restrictions Weight Bearing Restrictions: No    Mobility  Bed Mobility Overal bed mobility: Needs Assistance             General bed mobility comments: up with OT upon PT arrival  Transfers Overall transfer level: Needs assistance Equipment used: Rolling walker (2 wheeled) Transfers: Sit to/from Stand Sit to Stand:  Supervision         General transfer comment: for safety, sit to stand with PT x2 from chair after seated rest break and from toilet. Verbal cuing for hand placement and bringing RW with her prior to sitting on destination surface.  Ambulation/Gait Ambulation/Gait assistance: Supervision Gait Distance (Feet): 20 Feet (x2) Assistive device: Rolling walker (2 wheeled) Gait Pattern/deviations: Step-through pattern;Decreased stride length;Trunk flexed Gait velocity: decr   General Gait Details: Supervision for safety, verbal cuing for keeping RW close to body, rest as needed. SpO2 78% on RA, requiring 2LO2 to maintain sats >88%.   Stairs             Wheelchair Mobility    Modified Rankin (Stroke Patients Only)       Balance Overall balance assessment: Mild deficits observed, not formally tested                                          Cognition Arousal/Alertness: Awake/alert Behavior During Therapy: Flat affect Overall Cognitive Status: Within Functional Limits for tasks assessed                                 General Comments: Pt is somewhat irritable with depressed affect, shows little interest in PT      Exercises      General Comments  Pertinent Vitals/Pain Pain Assessment: No/denies pain Pain Intervention(s): Limited activity within patient's tolerance;Monitored during session    Home Living                      Prior Function            PT Goals (current goals can now be found in the care plan section) Acute Rehab PT Goals Patient Stated Goal: feel better and go home Time For Goal Achievement: 07/14/20 Potential to Achieve Goals: Good Progress towards PT goals: Progressing toward goals    Frequency    Min 3X/week      PT Plan Current plan remains appropriate    Co-evaluation              AM-PAC PT "6 Clicks" Mobility   Outcome Measure  Help needed turning from your back to your  side while in a flat bed without using bedrails?: A Little Help needed moving from lying on your back to sitting on the side of a flat bed without using bedrails?: A Little Help needed moving to and from a bed to a chair (including a wheelchair)?: A Little Help needed standing up from a chair using your arms (e.g., wheelchair or bedside chair)?: A Little Help needed to walk in hospital room?: A Little Help needed climbing 3-5 steps with a railing? : A Little 6 Click Score: 18    End of Session Equipment Utilized During Treatment: Oxygen Activity Tolerance: Patient limited by fatigue Patient left: with call bell/phone within reach;in bed;with bed alarm set Nurse Communication: Mobility status PT Visit Diagnosis: Muscle weakness (generalized) (M62.81);Difficulty in walking, not elsewhere classified (R26.2) Pain - Right/Left:  (bil)     Time: 3299-2426 PT Time Calculation (min) (ACUTE ONLY): 19 min  Charges:  $Gait Training: 8-22 mins                    Shashank Kwasnik E, PT Acute Rehabilitation Services Pager [phone]  Office [phone]    [name] [name] 07/03/2020, 1:10 PM

## 2020-07-03 NOTE — Progress Notes (Signed)
PROGRESS NOTE                                                                                                                                                                                                             Patient Demographics:    [name] [name], is a 74 y.o. female, DOB - 1946/07/01, WUJ:[id number]  Outpatient Primary MD for the patient is Fayrene Helper, MD    LOS - 4  Admit date - 06/29/2020    Chief Complaint  Patient presents with  . Covid Positive       Brief Narrative (HPI from H&P)   [name] [name] is a 74 y.o. 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  She was diagnosed with COVID-19 pneumonia and admitted to the hospital.  She is unfortunately not vaccinated.   Subjective:    [name] [name] today reports generalized weakness, fatigue, reports dyspnea improved at rest, but still present with activity, denies any chest pain or fever.    Assessment  & Plan :    Acute Covid 19 Viral Pneumonitis during the ongoing 2020 Covid 19 Pandemic  - she is unfortunately not vaccinated and so far has incurred mild to moderate parenchymal lung injury -Continue with steroids(to finish total of 10 days of steroids, I have confirmed with the patient, she is not on any prolonged steroid use, she only received few doses a month ago for knee osteoarthritis, and she has not been using it for a while) -Continue with remdesiv -  Monitor inflammatory markers closely.   - If needed she has consented for Actemra use.  Currently stable on room air.  But she is needing oxygen with activity 2 to 3 L, DME for oxygen will be arranged for discharge tomorrow.  Encouraged the patient to sit up in chair in the  daytime use I-S and flutter valve for pulmonary toiletry and then prone in bed when at night.  Will advance activity and titrate down oxygen as possible.  Actemra/Baricitinib  off label use - patient was told that if COVID-19 pneumonitis gets worse we might potentially use Actemra off label,  patient denies any known history of active diverticulitis, tuberculosis or hepatitis, understands the risks and benefits and wants to proceed with Actemra treatment if required.   SpO2: 100 % O2 Flow Rate (L/min): 2 L/min   Recent Labs  Lab 06/27/20 1659 06/27/20 1718 06/29/20 1146 06/29/20 2029 06/29/20 2051 06/30/20 0427 07/01/20 0025 07/02/20 0148 07/03/20 0032  WBC   < >  --  5.2  --   --  5.3 7.1 10.8* 10.9*  HGB   < >  --  13.5  --   --  12.4 12.6 12.5 12.8  HCT   < >  --  41.9  --   --  39.2 39.1 38.8 39.8  PLT   < >  --  271  --   --  258 304 335 367  CRP  --   --   --  5.2*  --  5.4* 5.3* 2.2* 1.6*  BNP  --   --   --   --   --  858.0* 1,319.6* 1,216.4* 1,139.5*  DDIMER  --   --   --  0.68*  --  0.87* 0.44 0.71* 0.59*  PROCALCITON  --   --   --  0.24  --   --   --   --   --   AST   < >  --  43*  --   --  35 33 25 29  ALT   < >  --  24  --   --  21 20 18 19   ALKPHOS   < >  --  32*  --   --  34* 33* 32* 37*  BILITOT   < >  --  0.8  --   --  0.7 0.5 0.7 0.8  ALBUMIN   < >  --  2.9*  --   --  2.6* 2.6* 2.5* 2.6*  LATICACIDVEN  --   --  1.6  --  2.0* 1.3  --   --   --   SARSCOV2NAA  --  POSITIVE*  --   --   --   --   --   --   --    < > = values in this interval not displayed.    Chronic combined systolic and diastolic heart failure.  EF 25% on recent echocardiogram.  S/p AICD placement.   -Currently blood pressure on the lower side hence have dropped home dose Coreg to 6.25 twice daily from 25 twice daily. -Giving tenuous blood pressure, Lasix and Aldactone has been dosed on as-needed basis, no evidence of volume overload today, hold on Lasix. - Renal function precludes the use of  ACE/ARB or Entresto.  Aortic stenosis s/p TAVR placement.   - stable  CKD stage IV.  Baseline creatinine around 1.5-1.7.   -Monitor.  Hypertension.   -Blood pressure medications adjusted.  Currently on moderate dose Coreg, Lasix and Aldactone to be dosed lately.  Dyslipidemia.   -Continue statin and Zetia.  DM type II.   -Currently on sliding scale, remains uncontrolled on 24 units of Lantus, will resume her home dose glipizide XL.  Marland Kitchen   Lab Results  Component Value Date   HGBA1C 7.4 (H) 05/07/2020   CBG (last 3)  Recent Labs    07/02/20 2014 07/03/20 0745 07/03/20 1221  GLUCAP 251* 138* 211*     Condition - Extremely Guarded  Family Communication  :  Enrique Sack on 06/30/20, 07/02/20 - [phone]  Code Status :  Full  Consults  :  None  Procedures  :  None  PUD Prophylaxis : None  Disposition Plan  :    Status is: Inpatient  Remains inpatient appropriate because:IV treatments appropriate due to intensity of illness or inability to take PO   Dispo: The patient is from: Home              Anticipated d/c is to: Home              Anticipated d/c date is: 2 days              Patient currently is not medically stable to d/c.   DVT Prophylaxis  :  Heparin   Lab Results  Component Value Date   PLT 367 07/03/2020    Diet :  Diet Order            Diet heart healthy/carb modified Room service appropriate? Yes; Fluid consistency: Thin  Diet effective now                  Inpatient Medications  Scheduled Meds: . vitamin C  500 mg Oral Daily  . aspirin EC  81 mg Oral q morning - 10a  . carvedilol  6.25 mg Oral BID WC  . ezetimibe  10 mg Oral Daily  . fenofibrate  54 mg Oral Daily  . glipiZIDE  2.5 mg Oral Q breakfast  . heparin  5,000 Units Subcutaneous Q8H  . insulin aspart  0-5 Units Subcutaneous QHS  . insulin aspart  0-9 Units Subcutaneous TID WC  . insulin glargine  24 Units Subcutaneous Daily  . methylPREDNISolone (SOLU-MEDROL)  injection  40 mg Intravenous Q12H  . pantoprazole  40 mg Oral Daily  . pravastatin  40 mg Oral q1800  . sodium chloride flush  3 mL Intravenous Q12H  . zinc sulfate  220 mg Oral Daily   Continuous Infusions: . remdesivir 100 mg in NS 100 mL 100 mg (07/03/20 0839)   PRN Meds:.acetaminophen, albuterol, chlorpheniramine-HYDROcodone, guaiFENesin-dextromethorphan, [DISCONTINUED] ondansetron **OR** ondansetron (ZOFRAN) IV  Antibiotics  :    Anti-infectives (From admission, onward)   Start     Dose/Rate Route Frequency Ordered Stop   07/01/20 1000  remdesivir 100 mg in sodium chloride 0.9 % 100 mL IVPB       "Followed by" Linked Group Details   100 mg 200 mL/hr over 30 Minutes Intravenous Daily 06/29/20 2342 07/05/20 0959   06/30/20 0100  remdesivir 200 mg in sodium chloride 0.9% 250 mL IVPB       "Followed by" Linked Group Details   200 mg 580 mL/hr over 30 Minutes Intravenous Once 06/29/20 2342 06/30/20 0358        Emeline Gins Shine Mikes M.D on 07/03/2020 at 3:16 PM  To page go to [email or web address]   [facility] -  Office  [phone]   See all Orders from today for further details    Objective:   Vitals:   07/03/20 0520 07/03/20 0741 07/03/20 0840 07/03/20 1154  BP: 104/70 104/60  108/72  Pulse: 73 68  75  Resp: 18 (!) 21  20  Temp: 97.6 F (36.4 C) 97.8 F (36.6 C)  97.7 F (36.5 C)  TempSrc: Axillary Oral  Oral  SpO2: 90% 92% 94% 100%  Weight:      Height:        Wt Readings from Last 3 Encounters:  07/03/20 78.7 kg  06/24/20 79.8 kg  05/11/20 79.4 kg  Intake/Output Summary (Last 24 hours) at 07/03/2020 1516 Last data filed at 07/03/2020 0517 Gross per 24 hour  Intake 120 ml  Output --  Net 120 ml     Physical Exam  Awake Alert, Oriented X 3, No new F.N deficits, Normal affect Symmetrical Chest wall movement, Good air movement bilaterally, CTAB RRR,No Gallops,Rubs or new Murmurs, No Parasternal Heave +ve B.Sounds, Abd Soft, No tenderness, No  rebound - guarding or rigidity. No Cyanosis, Clubbing or edema, No new Rash or bruise      Data Review:    CBC Recent Labs  Lab 06/29/20 1146 06/30/20 0427 07/01/20 0025 07/02/20 0148 07/03/20 0032  WBC 5.2 5.3 7.1 10.8* 10.9*  HGB 13.5 12.4 12.6 12.5 12.8  HCT 41.9 39.2 39.1 38.8 39.8  PLT 271 258 304 335 367  MCV 86.6 87.5 85.0 84.5 85.4  MCH 27.9 27.7 27.4 27.2 27.5  MCHC 32.2 31.6 32.2 32.2 32.2  RDW 14.9 15.0 14.7 14.7 14.8  LYMPHSABS 1.0 0.5* 0.5* 0.6* 0.6*  MONOABS 0.4 0.2 0.1 0.4 0.5  EOSABS 0.0 0.0 0.0 0.0 0.0  BASOSABS 0.0 0.0 0.0 0.0 0.0    Recent Labs  Lab 06/29/20 1146 06/29/20 2029 06/29/20 2051 06/30/20 0427 07/01/20 0025 07/02/20 0148 07/03/20 0032  NA 141  --   --  139 138 136 137  K 4.5  --   --  4.5 4.7 4.7 4.7  CL 106  --   --  106 108 105 108  CO2 24  --   --  20* 21* 18* 20*  GLUCOSE 158*  --   --  196* 196* 205* 175*  BUN 46*  --   --  47* 49* 54* 47*  CREATININE 1.89*  --   --  1.67* 1.49* 1.71* 1.58*  CALCIUM 9.0  --   --  8.8* 9.1 9.0 9.1  AST 43*  --   --  35 33 25 29  ALT 24  --   --  21 20 18 19   ALKPHOS 32*  --   --  34* 33* 32* 37*  BILITOT 0.8  --   --  0.7 0.5 0.7 0.8  ALBUMIN 2.9*  --   --  2.6* 2.6* 2.5* 2.6*  MG  --   --   --  2.4 2.5* 2.7* 2.6*  CRP  --  5.2*  --  5.4* 5.3* 2.2* 1.6*  DDIMER  --  0.68*  --  0.87* 0.44 0.71* 0.59*  PROCALCITON  --  0.24  --   --   --   --   --   LATICACIDVEN 1.6  --  2.0* 1.3  --   --   --   BNP  --   --   --  858.0* 1,319.6* 1,216.4* 1,139.5*    ------------------------------------------------------------------------------------------------------------------ No results for input(s): CHOL, HDL, LDLCALC, TRIG, CHOLHDL, LDLDIRECT in the last 72 hours.  Lab Results  Component Value Date   HGBA1C 7.4 (H) 05/07/2020   ------------------------------------------------------------------------------------------------------------------ No results for input(s): TSH, T4TOTAL, T3FREE, THYROIDAB  in the last 72 hours.  Invalid input(s): FREET3  Cardiac Enzymes No results for input(s): CKMB, TROPONINI, MYOGLOBIN in the last 168 hours.  Invalid input(s): CK ------------------------------------------------------------------------------------------------------------------    Component Value Date/Time   BNP 1,139.5 (H) 07/03/2020 0032    Micro Results Recent Results (from the past 240 hour(s))  Urine culture     Status: Abnormal   Collection Time: 06/24/20 10:08 PM   Specimen: Urine, Random  Result Value Ref Range Status   Specimen Description   Final    URINE, RANDOM Performed at [facility], [address], [place], [place] [address]    Special Requests   Final    NONE Performed at [facility], [address], [place], [place] [address]    Culture >=100,000 COLONIES/mL ESCHERICHIA COLI (A)  Final   Report Status 06/27/2020 FINAL  Final   Organism ID, Bacteria ESCHERICHIA COLI (A)  Final      Susceptibility   Escherichia coli - MIC*    AMPICILLIN 8 SENSITIVE Sensitive     CEFAZOLIN <=4 SENSITIVE Sensitive     CEFEPIME <=0.12 SENSITIVE Sensitive     CEFTRIAXONE <=0.25 SENSITIVE Sensitive     CIPROFLOXACIN <=0.25 SENSITIVE Sensitive     GENTAMICIN <=1 SENSITIVE Sensitive     IMIPENEM <=0.25 SENSITIVE Sensitive     NITROFURANTOIN <=16 SENSITIVE Sensitive     TRIMETH/SULFA <=20 SENSITIVE Sensitive     AMPICILLIN/SULBACTAM <=2 SENSITIVE Sensitive     PIP/TAZO <=4 SENSITIVE Sensitive     * >=100,000 COLONIES/mL ESCHERICHIA COLI  Respiratory Panel by RT PCR (Flu A&B, Covid) - Nasopharyngeal Swab     Status: Abnormal   Collection Time: 06/27/20  5:18 PM   Specimen: Nasopharyngeal Swab  Result Value Ref Range Status   SARS Coronavirus 2 by RT PCR POSITIVE (A) NEGATIVE Final    Comment: RESULT CALLED TO, READ BACK BY AND VERIFIED WITH: KENDRICK,J @ 1815 ON 06/27/20 BY JUW (NOTE) SARS-CoV-2 target nucleic acids are DETECTED.  SARS-CoV-2 RNA is generally detectable in  upper respiratory specimens  during the acute phase of infection. Positive results are indicative of the presence of the identified virus, but do not rule out bacterial infection or co-infection with other pathogens not detected by the test. Clinical correlation with patient history and other diagnostic information is necessary to determine patient infection status. The expected result is Negative.  Fact Sheet for Patients:  PinkCheek.be  Fact Sheet for Healthcare Providers: GravelBags.it  This test is not yet approved or cleared by the [place] FDA and  has been authorized for detection and/or diagnosis of SARS-CoV-2 by FDA under an Emergency Use Authorization (EUA).  This EUA will remain in effect (meaning this test can b e used) for the duration of  the COVID-19 declaration under Section 564(b)(1) of the Act, 21 U.S.C. section 360bbb-3(b)(1), unless the authorization is terminated or revoked sooner.      Influenza A by PCR NEGATIVE NEGATIVE Final   Influenza B by PCR NEGATIVE NEGATIVE Final    Comment: (NOTE) The Xpert Xpress SARS-CoV-2/FLU/RSV assay is intended as an aid in  the diagnosis of influenza from Nasopharyngeal swab specimens and  should not be used as a sole basis for treatment. Nasal washings and  aspirates are unacceptable for Xpert Xpress SARS-CoV-2/FLU/RSV  testing.  Fact Sheet for Patients: PinkCheek.be  Fact Sheet for Healthcare Providers: GravelBags.it  This test is not yet approved or cleared by the [place] FDA and  has been authorized for detection and/or diagnosis of SARS-CoV-2 by  FDA under an Emergency Use Authorization (EUA). This EUA will remain  in effect (meaning this test can be used) for the duration of the  Covid-19 declaration under Section 564(b)(1) of the Act, 21  U.S.C. section 360bbb-3(b)(1), unless the  authorization is  terminated or revoked. Performed at [facility], [address] [place], [place] [address]   Blood Culture (routine x 2)     Status:  None (Preliminary result)   Collection Time: 06/29/20  8:15 PM   Specimen: BLOOD  Result Value Ref Range Status   Specimen Description BLOOD RIGHT ARM  Final   Special Requests   Final    BOTTLES DRAWN AEROBIC AND ANAEROBIC Blood Culture results may not be optimal due to an excessive volume of blood received in culture bottles   Culture   Final    NO GROWTH 4 DAYS Performed at [facility], [place] [address], [place], [place] [address]    Report Status PENDING  Incomplete  Blood Culture (routine x 2)     Status: None (Preliminary result)   Collection Time: 06/29/20  8:29 PM   Specimen: BLOOD RIGHT HAND  Result Value Ref Range Status   Specimen Description BLOOD RIGHT HAND  Final   Special Requests   Final    BOTTLES DRAWN AEROBIC ONLY Blood Culture adequate volume   Culture   Final    NO GROWTH 4 DAYS Performed at [facility], [place] [address] [place], [place] [address]    Report Status PENDING  Incomplete    Radiology Reports US RENAL  Result Date: 06/14/2020 CLINICAL DATA:  Chronic renal disease. EXAM: RENAL / URINARY TRACT ULTRASOUND COMPLETE COMPARISON:  None. FINDINGS: Right Kidney: Renal measurements: 11.5 x 4.6 x 4.6 cm = volume: 127 mL. Increased cortical echogenicity. Left Kidney: Renal measurements: 10.5 x 5.7 x 4.6 cm = volume: 142 mL. Increased cortical echogenicity. Bladder: Appears normal for degree of bladder distention. Other: None. IMPRESSION: Medical renal disease.  No acute abnormalities noted. Electronically Signed   By: Dorise Bullion III M.D   On: 06/14/2020 11:30   DG Chest Portable 1 View  Result Date: 06/29/2020 CLINICAL DATA:  COVID-19 positive with increased shortness of breath and weakness. EXAM: PORTABLE CHEST 1 VIEW COMPARISON:  06/27/2020 FINDINGS: Lungs are hypoinflated with stable mild  bilateral patchy opacification likely viral pneumonia in this known COVID-19 positive patient. No evidence of effusion. Mild stable cardiomegaly. Remainder of the exam is unchanged. IMPRESSION: Stable bilateral patchy airspace process likely viral pneumonia in this known COVID-19 positive patient. Electronically Signed   By: Marin Olp M.D.   On: 06/29/2020 12:09   DG Chest Portable 1 View  Result Date: 06/27/2020 CLINICAL DATA:  Cough EXAM: PORTABLE CHEST 1 VIEW COMPARISON:  December 05, 2019 FINDINGS: The cardiomediastinal silhouette is unchanged in contour. Status post TAVR. LEFT chest AICD. No pleural effusion. No pneumothorax. There are RIGHT greater than LEFT peripheral predominant heterogeneous opacities. Visualized abdomen is unremarkable. Multilevel degenerative changes of the thoracic spine. IMPRESSION: RIGHT greater than LEFT peripheral predominant heterogeneous opacities, may represent multifocal infection, including COVID-19 pneumonia. Followup PA and lateral chest X-ray is recommended in 3-4 weeks after appropriate treatment to ensure resolution and exclude underlying malignancy. Electronically Signed   By: Valentino Saxon MD   On: 06/27/2020 14:57

## 2020-07-04 DIAGNOSIS — J9601 Acute respiratory failure with hypoxia: Secondary | ICD-10-CM | POA: Diagnosis not present

## 2020-07-04 DIAGNOSIS — N184 Chronic kidney disease, stage 4 (severe): Secondary | ICD-10-CM | POA: Diagnosis not present

## 2020-07-04 DIAGNOSIS — U071 COVID-19: Secondary | ICD-10-CM | POA: Diagnosis not present

## 2020-07-04 DIAGNOSIS — I5042 Chronic combined systolic (congestive) and diastolic (congestive) heart failure: Secondary | ICD-10-CM | POA: Diagnosis not present

## 2020-07-04 LAB — CULTURE, BLOOD (ROUTINE X 2)
Culture: NO GROWTH
Culture: NO GROWTH
Special Requests: ADEQUATE

## 2020-07-04 LAB — COMPREHENSIVE METABOLIC PANEL
ALT: 19 U/L (ref 0–44)
AST: 26 U/L (ref 15–41)
Albumin: 2.4 g/dL — ABNORMAL LOW (ref 3.5–5.0)
Alkaline Phosphatase: 43 U/L (ref 38–126)
Anion gap: 12 (ref 5–15)
BUN: 46 mg/dL — ABNORMAL HIGH (ref 8–23)
CO2: 17 mmol/L — ABNORMAL LOW (ref 22–32)
Calcium: 8.9 mg/dL (ref 8.9–10.3)
Chloride: 105 mmol/L (ref 98–111)
Creatinine, Ser: 1.61 mg/dL — ABNORMAL HIGH (ref 0.44–1.00)
GFR, Estimated: 33 mL/min — ABNORMAL LOW (ref >60)
Glucose, Bld: 238 mg/dL — ABNORMAL HIGH (ref 70–99)
Potassium: 4.7 mmol/L (ref 3.5–5.1)
Sodium: 134 mmol/L — ABNORMAL LOW (ref 135–145)
Total Bilirubin: 0.7 mg/dL (ref 0.3–1.2)
Total Protein: 5.9 g/dL — ABNORMAL LOW (ref 6.5–8.1)

## 2020-07-04 LAB — CBC WITH DIFFERENTIAL/PLATELET
Abs Immature Granulocytes: 0.25 10*3/uL — ABNORMAL HIGH (ref 0.00–0.07)
Basophils Absolute: 0 10*3/uL (ref 0.0–0.1)
Basophils Relative: 0 %
Eosinophils Absolute: 0 10*3/uL (ref 0.0–0.5)
Eosinophils Relative: 0 %
HCT: 40.4 % (ref 36.0–46.0)
Hemoglobin: 13.2 g/dL (ref 12.0–15.0)
Immature Granulocytes: 2 %
Lymphocytes Relative: 7 %
Lymphs Abs: 0.8 10*3/uL (ref 0.7–4.0)
MCH: 27.3 pg (ref 26.0–34.0)
MCHC: 32.7 g/dL (ref 30.0–36.0)
MCV: 83.5 fL (ref 80.0–100.0)
Monocytes Absolute: 0.4 10*3/uL (ref 0.1–1.0)
Monocytes Relative: 4 %
Neutro Abs: 9.3 10*3/uL — ABNORMAL HIGH (ref 1.7–7.7)
Neutrophils Relative %: 87 %
Platelets: 350 10*3/uL (ref 150–400)
RBC: 4.84 MIL/uL (ref 3.87–5.11)
RDW: 14.7 % (ref 11.5–15.5)
WBC: 10.8 10*3/uL — ABNORMAL HIGH (ref 4.0–10.5)
nRBC: 0 % (ref 0.0–0.2)

## 2020-07-04 LAB — MAGNESIUM: Magnesium: 2.7 mg/dL — ABNORMAL HIGH (ref 1.7–2.4)

## 2020-07-04 LAB — D-DIMER, QUANTITATIVE: D-Dimer, Quant: 0.76 ug/mL-FEU — ABNORMAL HIGH (ref 0.00–0.50)

## 2020-07-04 LAB — C-REACTIVE PROTEIN: CRP: 4.4 mg/dL — ABNORMAL HIGH (ref <1.0)

## 2020-07-04 LAB — GLUCOSE, CAPILLARY
Glucose-Capillary: 222 mg/dL — ABNORMAL HIGH (ref 70–99)
Glucose-Capillary: 280 mg/dL — ABNORMAL HIGH (ref 70–99)

## 2020-07-04 LAB — BRAIN NATRIURETIC PEPTIDE: B Natriuretic Peptide: 1026.8 pg/mL — ABNORMAL HIGH (ref 0.0–100.0)

## 2020-07-04 MED ORDER — DEXAMETHASONE 6 MG PO TABS: 6.0000 mg | ORAL_TABLET | Freq: Every day | ORAL | 0 refills | Status: AC

## 2020-07-04 MED ORDER — CARVEDILOL 6.25 MG PO TABS
6.2500 mg | ORAL_TABLET | Freq: Two times a day (BID) | ORAL | 0 refills | Status: DC
Start: 2020-07-04 — End: 2020-08-25

## 2020-07-04 MED ORDER — PANTOPRAZOLE SODIUM 40 MG PO TBEC
40.0000 mg | DELAYED_RELEASE_TABLET | Freq: Every day | ORAL | 0 refills | Status: DC
Start: 2020-07-05 — End: 2020-08-25

## 2020-07-04 MED ORDER — FUROSEMIDE 40 MG PO TABS
20.0000 mg | ORAL_TABLET | Freq: Every day | ORAL | 3 refills | Status: DC
Start: 2020-07-08 — End: 2020-08-12

## 2020-07-04 MED ORDER — SPIRONOLACTONE 25 MG PO TABS
25.0000 mg | ORAL_TABLET | Freq: Every day | ORAL | 0 refills | Status: DC
Start: 2020-07-08 — End: 2020-07-21

## 2020-07-04 NOTE — Progress Notes (Signed)
Pt discharged home with PTAR.  Discharge instructions explained, Pt verbalizes understanding.

## 2020-07-04 NOTE — TOC Transition Note (Addendum)
Transition of Care [facility]) - CM/SW Discharge Note   Patient Details  Name: LASHAUNDRA LEHRMANN MRN: [id number] Date of Birth: 28-Jun-1946  [facility] CM/SW Contact:  Carles Collet, RN Phone Number: 07/04/2020, 10:53 AM   Clinical Narrative:    Damaris Schooner w patient, she deferred to son [name]. Spoke w [name] over the phone. He states that the patient will DC to address on file [place] [address]. He states that family will provide supervision. Verified that patient will need [facility] transport. He states that they have not heard from [employer] about delivery of home oxygen. Spoke w Thedore Mins from [place], they were not notified patient would DC via PTAR and need concentrator at home prior to DC. Thedore Mins is working with weekend logistics team to set up for today, he was informed of DC address and provided with Curtis's phone number to arrange with him. CM still working on [facility] provider.  11:30 Alvis Lemmings accepted for [facility] services.  Thedore Mins w [employer] states that they should have the home oxygen to the house around noon. Will verify w son, [name] that it has been delivered prior to calling PTAR.   1400 Person son, O2 has been set up at home. PT[facility] called for transport   Final next level of care: [place] Barriers to Discharge: No Barriers Identified   Patient Goals and CMS Choice Patient states their goals for this hospitalization and ongoing recovery are:: to go home CMS Medicare.gov Compare Post Acute Care list provided to:: Other (Comment Required) Choice offered to / list presented to : Adult Children  Discharge Placement                       Discharge Plan and Services   Discharge Planning Services: CM Consult Post Acute Care Choice: Home Health          DME Arranged: Oxygen DME Agency: [employer] Date DME Agency Contacted: 07/04/20 Time DME Agency Contacted: 5277 Representative spoke with at DME Agency: Thedore Mins notified that patient will DC via  [place]: PT, OT       Representative spoke with at [place]: Pending  Social Determinants of Health ([place]) Interventions     Readmission Risk Interventions No flowsheet data found.

## 2020-07-04 NOTE — Discharge Instructions (Signed)
Person Under Monitoring Name: [name] [name]  Location: [address] [place] [address]   Infection Prevention Recommendations for Individuals Confirmed to have, or Being Evaluated for, 2019 Novel Coronavirus (COVID-19) Infection Who Receive Care at Home  Individuals who are confirmed to have, or are being evaluated for, COVID-19 should follow the prevention steps below until a healthcare provider or local or state health department says they can return to normal activities.  Stay home except to get medical care You should restrict activities outside your home, except for getting medical care. Do not go to work, school, or public areas, and do not use public transportation or taxis.  Call ahead before visiting your doctor Before your medical appointment, call the healthcare provider and tell them that you have, or are being evaluated for, COVID-19 infection. This will help the healthcare provider's office take steps to keep other people from getting infected. Ask your healthcare provider to call the local or state health department.  Monitor your symptoms Seek prompt medical attention if your illness is worsening (e.g., difficulty breathing). Before going to your medical appointment, call the healthcare provider and tell them that you have, or are being evaluated for, COVID-19 infection. Ask your healthcare provider to call the local or state health department.  Wear a facemask You should wear a facemask that covers your nose and mouth when you are in the same room with other people and when you visit a healthcare provider. People who live with or visit you should also wear a facemask while they are in the same room with you.  Separate yourself from other people in your home As much as possible, you should stay in a different room from other people in your home. Also, you should use a separate bathroom, if available.  Avoid sharing household items You should  not share dishes, drinking glasses, cups, eating utensils, towels, bedding, or other items with other people in your home. After using these items, you should wash them thoroughly with soap and water.  Cover your coughs and sneezes Cover your mouth and nose with a tissue when you cough or sneeze, or you can cough or sneeze into your sleeve. Throw used tissues in a lined trash can, and immediately wash your hands with soap and water for at least 20 seconds or use an alcohol-based hand rub.  Wash your [facility] your hands often and thoroughly with soap and water for at least 20 seconds. You can use an alcohol-based hand sanitizer if soap and water are not available and if your hands are not visibly dirty. Avoid touching your eyes, nose, and mouth with unwashed hands.   Prevention Steps for Caregivers and Household Members of Individuals Confirmed to have, or Being Evaluated for, COVID-19 Infection Being Cared for in the Home  If you live with, or provide care at home for, a person confirmed to have, or being evaluated for, COVID-19 infection please follow these guidelines to prevent infection:  Follow healthcare provider's instructions Make sure that you understand and can help the patient follow any healthcare provider instructions for all care.  Provide for the patient's basic needs You should help the patient with basic needs in the home and provide support for getting groceries, prescriptions, and other personal needs.  Monitor the patient's symptoms If they are getting sicker, call his or her medical provider and tell them that the patient has, or is being evaluated for, COVID-19 infection. This will help the healthcare provider's  office take steps to keep other people from getting infected. Ask the healthcare provider to call the local or state health department.  Limit the number of people who have contact with the patient  If possible, have only one caregiver for the  patient.  Other household members should stay in another home or place of residence. If this is not possible, they should stay  in another room, or be separated from the patient as much as possible. Use a separate bathroom, if available.  Restrict visitors who do not have an essential need to be in the home.  Keep older adults, very young children, and other sick people away from the patient Keep older adults, very young children, and those who have compromised immune systems or chronic health conditions away from the patient. This includes people with chronic heart, lung, or kidney conditions, diabetes, and cancer.  Ensure good ventilation Make sure that shared spaces in the home have good air flow, such as from an air conditioner or an opened window, weather permitting.  Wash your hands often  Wash your hands often and thoroughly with soap and water for at least 20 seconds. You can use an alcohol based hand sanitizer if soap and water are not available and if your hands are not visibly dirty.  Avoid touching your eyes, nose, and mouth with unwashed hands.  Use disposable paper towels to dry your hands. If not available, use dedicated cloth towels and replace them when they become wet.  Wear a facemask and gloves  Wear a disposable facemask at all times in the room and gloves when you touch or have contact with the patient's blood, body fluids, and/or secretions or excretions, such as sweat, saliva, sputum, nasal mucus, vomit, urine, or feces.  Ensure the mask fits over your nose and mouth tightly, and do not touch it during use.  Throw out disposable facemasks and gloves after using them. Do not reuse.  Wash your hands immediately after removing your facemask and gloves.  If your personal clothing becomes contaminated, carefully remove clothing and launder. Wash your hands after handling contaminated clothing.  Place all used disposable facemasks, gloves, and other waste in a lined  container before disposing them with other household waste.  Remove gloves and wash your hands immediately after handling these items.  Do not share dishes, glasses, or other household items with the patient  Avoid sharing household items. You should not share dishes, drinking glasses, cups, eating utensils, towels, bedding, or other items with a patient who is confirmed to have, or being evaluated for, COVID-19 infection.  After the person uses these items, you should wash them thoroughly with soap and water.  Wash laundry thoroughly  Immediately remove and wash clothes or bedding that have blood, body fluids, and/or secretions or excretions, such as sweat, saliva, sputum, nasal mucus, vomit, urine, or feces, on them.  Wear gloves when handling laundry from the patient.  Read and follow directions on labels of laundry or clothing items and detergent. In general, wash and dry with the warmest temperatures recommended on the label.  Clean all areas the individual has used often  Clean all touchable surfaces, such as counters, tabletops, doorknobs, bathroom fixtures, toilets, phones, keyboards, tablets, and bedside tables, every day. Also, clean any surfaces that may have blood, body fluids, and/or secretions or excretions on them.  Wear gloves when cleaning surfaces the patient has come in contact with.  Use a diluted bleach solution (e.g., dilute bleach with 1  part bleach and 10 parts water) or a household disinfectant with a label that says EPA-registered for coronaviruses. To make a bleach solution at home, add 1 tablespoon of bleach to 1 quart (4 cups) of water. For a larger supply, add  cup of bleach to 1 gallon (16 cups) of water.  Read labels of cleaning products and follow recommendations provided on product labels. Labels contain instructions for safe and effective use of the cleaning product including precautions you should take when applying the product, such as wearing gloves or  eye protection and making sure you have good ventilation during use of the product.  Remove gloves and wash hands immediately after cleaning.  Monitor yourself for signs and symptoms of illness Caregivers and household members are considered close contacts, should monitor their health, and will be asked to limit movement outside of the home to the extent possible. Follow the monitoring steps for close contacts listed on the symptom monitoring form.   ? If you have additional questions, contact your local health department or call the epidemiologist on call at [phone] (available 24/7). ? This guidance is subject to change. For the most up-to-date guidance from [facility], please refer to their website: [email or web address]

## 2020-07-04 NOTE — Discharge Summary (Signed)
ARVILLA [name], is a 74 y.o. female  DOB 10-13-1945  MRN [id number].  Admission date:  06/29/2020  Admitting Physician  Lenore Cordia, MD  Discharge Date:  07/04/2020   Primary MD  Fayrene Helper, MD  Recommendations for primary care physician for things to follow:  -Please readjust CHF medications and diuresis as needed, as they have been changed during this hospital stay, please see discussion below. -Please check CBC, CMP during next visit.  Admission Diagnosis  Hypoxia [R09.02] Acute hypoxemic respiratory failure due to COVID-19 ([place]) [U07.1, J96.01] COVID-19 [U07.1]   Discharge Diagnosis  Hypoxia [R09.02] Acute hypoxemic respiratory failure due to COVID-19 ([place]) [U07.1, J96.01] COVID-19 [U07.1]    Principal Problem:   Acute hypoxemic respiratory failure due to COVID-19 [facility]) Active Problems:   Type 2 diabetes mellitus with nephropathy (HCC)   Chronic combined systolic and diastolic CHF (congestive heart failure) (HCC)   S/P TAVR (transcatheter aortic valve replacement)   CKD (chronic kidney disease), stage IV ([place])   Hypertension associated with diabetes ([place])   Hyperlipidemia associated with type 2 diabetes mellitus ([place])      Past Medical History:  Diagnosis Date  . Anxiety   . Aortic stenosis   . Arthritis   . Biventricular ICD (implantable cardioverter-defibrillator) in place    March 2021, [employer]  -[name]  . Chronic combined systolic and diastolic CHF (congestive heart failure) ([place])   . CKD (chronic kidney disease) stage 3, GFR 30-59 ml/min (HCC)   . Constipation   . Depression   . Diabetes mellitus, type 2 ([place])   . Essential hypertension   . Hyperlipidemia   . Incidental pulmonary nodule, > 26m and < 831m   7 x 4 mm LLL nodule  . LBBB (left bundle branch block)   . Nonischemic cardiomyopathy (HCC)    No significant obstructive CAD at heart catheterization  05/2014, LVEF 20%  . Obesity   . Pneumonia 2013  . 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 Symptomatic PVCs     Past Surgical History:  Procedure Laterality Date  . BIV ICD INSERTION CRT-D N/A 10/28/2019   Procedure: BIV ICD INSERTION CRT-D;  Surgeon: TaEvans LanceMD;  [facility];  Service: Cardiovascular;  Laterality: N/A;  . CARDIAC CATHETERIZATION    . CESAREAN SECTION  1987  . FLEXIBLE BRONCHOSCOPY N/A 01/14/2015   Procedure: FLEXIBLE BRONCHOSCOPY;  Surgeon: StMelrose NakayamaMD;  Location: [facility]: Thoracic;  Laterality: N/A;  . LEFT AND RIGHT HEART CATHETERIZATION WITH CORONARY ANGIOGRAM N/A 12/05/2011   Procedure: LEFT AND RIGHT HEART CATHETERIZATION WITH CORONARY ANGIOGRAM;  Surgeon: ChBurnell BlanksMD;  [facility];  Service: Cardiovascular;  Laterality: N/A;  . LEFT AND RIGHT HEART CATHETERIZATION WITH CORONARY ANGIOGRAM N/A 05/23/2014   Procedure: LEFT AND RIGHT HEART CATHETERIZATION WITH CORONARY ANGIOGRAM;  Surgeon: ChBurnell BlanksMD;  Location:  [place] CATH LAB;  Service: Cardiovascular;  Laterality: N/A;  . MULTIPLE EXTRACTIONS WITH ALVEOLOPLASTY N/A 02/27/2014   Procedure: Extraction of tooth #'s 2,4,5,6,7,8,9,10,11,12, 22, 23, 24, 25, 26, 28 with alveoloplasty and bilateral mandibular tori reductions;  Surgeon: [name], DDS;  Location: [place];  Service: Oral Surgery;  Laterality: N/A;  . RIGID BRONCHOSCOPY N/A 01/14/2015   Procedure: RIGID BRONCHOSCOPY with Removal Of Foreign Body ;  Surgeon: [name], MD;  Location: [place];  Service: Thoracic;  Laterality: N/A;  . TEE WITHOUT CARDIOVERSION N/A 03/24/2015   Procedure: TRANSESOPHAGEAL ECHOCARDIOGRAM (TEE);  Surgeon: [name], MD;  Location: [place];  Service: Open Heart Surgery;  Laterality: N/A;  . TRANSCATHETER AORTIC VALVE  REPLACEMENT, TRANSFEMORAL Bilateral 03/24/2015   Procedure: TRANSCATHETER AORTIC VALVE REPLACEMENT, TRANSFEMORAL;  Surgeon: [name], MD;  Location: [place];  Service: Open Heart Surgery;  Laterality: Bilateral;  . TUBAL LIGATION  1987  . VIDEO BRONCHOSCOPY Bilateral 06/05/2014   Procedure: VIDEO BRONCHOSCOPY WITHOUT FLUORO;  Surgeon: [name], MD;  Location: [facility]COPY;  Service: Cardiopulmonary;  Laterality: Bilateral;       History of present illness and  Hospital Course:     Kindly see H&P for history of present illness and admission details, please review complete Labs, Consult reports and Test reports for all details in brief  HPI  from the history and physical done on the day of admission 06/29/2020  HPI: [name] [name] is a 74 y.o. 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  Patient was seen in the ED on 06/27/2020 for nonproductive cough and generalized weakness ongoing for 3-4 days. CXR showed bilateral infiltrates. Covid test was obtained and resulted positive. Patient was not hypoxic at that time. She did receive Regen-CoV monoclonal antibody infusion on the same day and discharged home.  Patient states she has been having continued progressive fatigue with dyspnea on exertion.  She has had cough intermittently productive of white sputum but largely nonproductive.  She has had diarrhea and decreased energy.  She says she has been eating fairly well when family has brought her food but says she has not had energy to cook for herself.  She denies any subjective fevers, chills, diaphoresis, chest pain, palpitations, nausea, vomiting, abdominal pain, dysuria, or lower extremity edema.  She says she has not received any of the COVID-19 vaccinations due to  fear of potential side effect since she has an ICD in place.  ED Course:  Initial vitals showed BP 97/58, pulse 73, RR 21, temp 98.7 Fahrenheit, SPO2 reportedly in the 80s on room air. Patient was placed on 2 L of home O2 via [place] which was then increased to 3-3.5 L.  Labs show sodium 141, potassium 4.5, bicarb 24, BUN 46, creatinine 1.89, serum glucose 158, AST 43, ALT 24, alk phos 32, total bilirubin 0.8, WBC 5.2, hemoglobin 13.5, platelets 271,000, lactic acid 1.6, D-dimer 0.68, procalcitonin 0.24, LDH 385, ferritin 42, CRP 5.2. Repeat lactic acid was 2.0. Blood cultures were obtained and pending.  Portable chest x-ray again showed bilateral patchy airspace disease.  The hospitalist service was consulted to admit for further evaluation and management.  Hospital Course   Acute Covid 19 Viral Pneumonitis during the ongoing 2020 Covid 19 Pandemic  -  she is unfortunately not vaccinated.  Parenchymal lung injury from COVID-19, she was treated with IV steroids, IV remdesivir, she did finish total of 5 days of remdesivir, to finish another 5 days of oral Decadron as an outpatient often days treatment, (patient is not on any chronic steroids, she did confirm that) . -We will be discharged on oxygen which has been arranged on discharge. -She was instructed to take her incentive spirometer and flutter valve and keep using it at home.  Chronic combined systolic and diastolic heart failure.  -  EF 25% on recent echocardiogram.  S/p AICD placement.   -She is on the dry side/euvolemic during this hospital stay, she was on as needed Lasix, and her blood pressure has been soft as well, so her Coreg has been lowered from 25 mg twice daily to 6.25 mg twice daily , as well she was instructed to resume Lasix and Aldactone in 4 days . - Renal function precludes the use of ACE/ARB or Entresto.  Aortic stenosis s/p TAVR placement.   - stable  CKD stage IV.  Baseline creatinine around 1.5-1.7.    -Monitor.  Hypertension.   -Given soft blood pressure her Coreg dose has been lowered, and with instructions to resume Lasix and Aldactone in few days from discharge .  Dyslipidemia.   -Continue statin and Zetia.  DM type II.   -Resume home medications on discharge  Discharge Condition:  stable   Follow UP   Follow-up Information    Fayrene Helper, MD Follow up.   Specialty: Family Medicine Contact information: [address] [place]  [place] [address] [phone]        [facility] Follow up.   Specialty: Home Health Services Why: For home health services. They will call you in 1-2 days.  Contact information: [place] [address] [phone]                 Discharge Instructions  and  Discharge Medications     Discharge Instructions    Discharge instructions   Complete by: As directed    Follow with Primary MD Fayrene Helper, MD in 14 days   Get CBC, CMP,  checked  by Primary MD next visit.    Activity: As tolerated with Full fall precautions use walker/cane & assistance as needed   Disposition Home    Diet: Heart Healthy /Carb modified , with feeding assistance and aspiration precautions.  For Heart failure patients - Check your Weight same time everyday, if you gain over 2 pounds, or you develop in leg swelling, experience more shortness of breath or chest pain, call your Primary MD immediately. Follow Cardiac Low Salt Diet and 1.5 lit/day fluid restriction.   On your next visit with your primary care physician please Get Medicines reviewed and adjusted.   Please request your Prim.MD to go over all Hospital Tests and Procedure/Radiological results at the follow up, please get all Hospital records sent to your Prim MD by signing hospital release before you go home.   If you experience worsening of your admission symptoms, develop shortness of breath, life threatening emergency,  suicidal or homicidal thoughts you must seek medical attention immediately by calling 911 or calling your MD immediately  if symptoms less severe.  You Must read complete instructions/literature along with all the possible adverse reactions/side effects for all the Medicines you take and that have been prescribed to you. Take any new Medicines after you  have completely understood and accpet all the possible adverse reactions/side effects.   Do not drive, operating heavy machinery, perform activities at heights, swimming or participation in water activities or provide baby sitting services if your were admitted for syncope or siezures until you have seen by Primary MD or a Neurologist and advised to do so again.  Do not drive when taking Pain medications.    Do not take more than prescribed Pain, Sleep and Anxiety Medications  Special Instructions: If you have smoked or chewed Tobacco  in the last 2 yrs please stop smoking, stop any regular Alcohol  and or any Recreational drug use.  Wear Seat belts while driving.   Please note  You were cared for by a hospitalist during your hospital stay. If you have any questions about your discharge medications or the care you received while you were in the hospital after you are discharged, you can call the unit and asked to speak with the hospitalist on call if the hospitalist that took care of you is not available. Once you are discharged, your primary care physician will handle any further medical issues. Please note that NO REFILLS for any discharge medications will be authorized once you are discharged, as it is imperative that you return to your primary care physician (or establish a relationship with a primary care physician if you do not have one) for your aftercare needs so that they can reassess your need for medications and monitor your lab values.   Increase activity slowly   Complete by: As directed      Allergies as of 07/04/2020       Reactions   Crestor [rosuvastatin] Other (See Comments)   Muscle aches and elevated CK   Tramadol Nausea Only   States blood sugar elevated also   Penicillins Rash, Other (See Comments)   Family unsure of reaction, but certain mom said she was allergic Did it involve swelling of the face/tongue/throat, SOB, or low BP? No Did it involve sudden or severe rash/hives, skin peeling, or any reaction on the inside of your mouth or nose? Yes Did you need to seek medical attention at a hospital or doctor's office? Yes When did it last happen?10 + years If all above answers are "NO", may proceed with cephalosporin use.      Medication List    STOP taking these medications   cephALEXin 500 MG capsule Commonly known as: KEFLEX   predniSONE 10 MG tablet Commonly known as: DELTASONE     TAKE these medications   acetaminophen 500 MG tablet Commonly known as: TYLENOL Take one tablet two times daily, by mouth, for knee  pain What changed:   how much to take  how to take this  when to take this  reasons to take this   aspirin EC 81 MG tablet Take 81 mg by mouth every morning.   blood glucose meter kit and supplies Dispense based on patient and insurance preference. Use to test three times daily (FOR ICD-10 E10.9, E11.9).   carvedilol 6.25 MG tablet Commonly known as: COREG Take 1 tablet (6.25 mg total) by mouth 2 (two) times daily with a meal. What changed:   medication strength  how much to take   colchicine 0.6 MG tablet Take 1 tablet (0.6 mg total) by mouth 2 (two) times daily. Take with food   dexamethasone 6 MG tablet Commonly known as: DECADRON Take 1 tablet (6 mg total) by mouth daily for 5 days. Start taking  on: July 05, 2020   diclofenac Sodium 1 % Gel Commonly known as: VOLTAREN Apply 1 application topically 4 (four) times daily as needed (pain).   ezetimibe 10 MG tablet Commonly known as: ZETIA TAKE ONE TABLET BY MOUTH ONCE DAILY.   fenofibrate  145 MG tablet Commonly known as: TRICOR TAKE (1) TABLET BY MOUTH ONCE DAILY. What changed: See the new instructions.   furosemide 40 MG tablet Commonly known as: LASIX Take 0.5 tablets (20 mg total) by mouth daily. Start taking on: July 08, 2020 What changed:   See the new instructions.  These instructions start on July 08, 2020. If you are unsure what to do until then, ask your doctor or other care provider.   glipiZIDE 2.5 MG 24 hr tablet Commonly known as: GLUCOTROL XL Take 1 tablet (2.5 mg total) by mouth daily with breakfast.   Litetouch Pen Needles 31G X 8 MM Misc Generic drug: Insulin Pen Needle USE AS NEEDED TO INJECT INSULIN.   Livalo 2 MG Tabs Generic drug: Pitavastatin Calcium TAKE ONE TABLET BY MOUTH ONCE DAILY AFTER SUPPER. What changed: See the new instructions.   MULTIVITAMIN PO Take 1 tablet by mouth every morning.   NovoLOG Mix 70/30 FlexPen (70-30) 100 UNIT/ML FlexPen Generic drug: insulin aspart protamine - aspart INJECT 12 TO 15 UNITS SUBCUTANEOUSLY TWICE DAILY. What changed: See the new instructions.   OneTouch Delica Plus EPPIRJ18A Misc USE 3 TIMES A DAY AS DIRECTED. What changed: See the new instructions.   OneTouch Ultra test strip Generic drug: glucose blood CHECK BLOOD SUGAR 3 TIMES A DAY. What changed: See the new instructions.   pantoprazole 40 MG tablet Commonly known as: PROTONIX Take 1 tablet (40 mg total) by mouth daily. Start taking on: July 05, 2020   potassium chloride 10 MEQ tablet Commonly known as: KLOR-CON TAKE ONE TABLET BY MOUTH DAILY. DO NOT TAKE IF NOT TAKING FUROSEMIDE. What changed: See the new instructions.   spironolactone 25 MG tablet Commonly known as: ALDACTONE Take 1 tablet (25 mg total) by mouth daily. Start taking on: July 08, 2020 What changed: These instructions start on July 08, 2020. If you are unsure what to do until then, ask your doctor or other care provider.   UNABLE TO  FIND Shower bench x 1   DX M17.12   UNABLE TO FIND Rolling walker with seat x 1  DX m17.12            Durable Medical Equipment  (From admission, onward)         Start     Ordered   07/02/20 1401  For home use only DME oxygen  Once       Question Answer Comment  Length of Need 6 Months   Mode or (Route) Nasal cannula   Liters per Minute 2   Frequency Continuous (stationary and portable oxygen unit needed)   Oxygen conserving device Yes   Oxygen delivery system Gas      07/02/20 1400            Diet and Activity recommendation: See Discharge Instructions above   Consults obtained -  none   Major procedures and Radiology Reports - PLEASE review detailed and final reports for all details, in brief -      US RENAL  Result Date: 06/14/2020 CLINICAL DATA:  Chronic renal disease. EXAM: RENAL / URINARY TRACT ULTRASOUND COMPLETE COMPARISON:  None. FINDINGS: Right Kidney: Renal measurements: 11.5 x 4.6 x 4.6 cm = volume:  127 mL. Increased cortical echogenicity. Left Kidney: Renal measurements: 10.5 x 5.7 x 4.6 cm = volume: 142 mL. Increased cortical echogenicity. Bladder: Appears normal for degree of bladder distention. Other: None. IMPRESSION: Medical renal disease.  No acute abnormalities noted. Electronically Signed   By: Dorise Bullion III M.D   On: 06/14/2020 11:30   DG Chest Portable 1 View  Result Date: 06/29/2020 CLINICAL DATA:  COVID-19 positive with increased shortness of breath and weakness. EXAM: PORTABLE CHEST 1 VIEW COMPARISON:  06/27/2020 FINDINGS: Lungs are hypoinflated with stable mild bilateral patchy opacification likely viral pneumonia in this known COVID-19 positive patient. No evidence of effusion. Mild stable cardiomegaly. Remainder of the exam is unchanged. IMPRESSION: Stable bilateral patchy airspace process likely viral pneumonia in this known COVID-19 positive patient. Electronically Signed   By: Marin Olp M.D.   On: 06/29/2020 12:09    DG Chest Portable 1 View  Result Date: 06/27/2020 CLINICAL DATA:  Cough EXAM: PORTABLE CHEST 1 VIEW COMPARISON:  December 05, 2019 FINDINGS: The cardiomediastinal silhouette is unchanged in contour. Status post TAVR. LEFT chest AICD. No pleural effusion. No pneumothorax. There are RIGHT greater than LEFT peripheral predominant heterogeneous opacities. Visualized abdomen is unremarkable. Multilevel degenerative changes of the thoracic spine. IMPRESSION: RIGHT greater than LEFT peripheral predominant heterogeneous opacities, may represent multifocal infection, including COVID-19 pneumonia. Followup PA and lateral chest X-ray is recommended in 3-4 weeks after appropriate treatment to ensure resolution and exclude underlying malignancy. Electronically Signed   By: Valentino Saxon MD   On: 06/27/2020 14:57    Micro Results     Recent Results (from the past 240 hour(s))  Urine culture     Status: Abnormal   Collection Time: 06/24/20 10:08 PM   Specimen: Urine, Random  Result Value Ref Range Status   Specimen Description   Final    URINE, RANDOM Performed at [facility], [address] [place], [place] [address]    Special Requests   Final    NONE Performed at [facility], [address] [place], [place] [address]    Culture >=100,000 COLONIES/mL ESCHERICHIA COLI (A)  Final   Report Status 06/27/2020 FINAL  Final   Organism ID, Bacteria ESCHERICHIA COLI (A)  Final      Susceptibility   Escherichia coli - MIC*    AMPICILLIN 8 SENSITIVE Sensitive     CEFAZOLIN <=4 SENSITIVE Sensitive     CEFEPIME <=0.12 SENSITIVE Sensitive     CEFTRIAXONE <=0.25 SENSITIVE Sensitive     CIPROFLOXACIN <=0.25 SENSITIVE Sensitive     GENTAMICIN <=1 SENSITIVE Sensitive     IMIPENEM <=0.25 SENSITIVE Sensitive     NITROFURANTOIN <=16 SENSITIVE Sensitive     TRIMETH/SULFA <=20 SENSITIVE Sensitive     AMPICILLIN/SULBACTAM <=2 SENSITIVE Sensitive     PIP/TAZO <=4 SENSITIVE Sensitive     * >=100,000  COLONIES/mL ESCHERICHIA COLI  Respiratory Panel by RT PCR (Flu A&B, Covid) - Nasopharyngeal Swab     Status: Abnormal   Collection Time: 06/27/20  5:18 PM   Specimen: Nasopharyngeal Swab  Result Value Ref Range Status   SARS Coronavirus 2 by RT PCR POSITIVE (A) NEGATIVE Final    Comment: RESULT CALLED TO, READ BACK BY AND VERIFIED WITH: KENDRICK,J @ 1815 ON 06/27/20 BY JUW (NOTE) SARS-CoV-2 target nucleic acids are DETECTED.  SARS-CoV-2 RNA is generally detectable in upper respiratory specimens  during the acute phase of infection. Positive results are indicative of the presence of the identified virus, but do not rule  out bacterial infection or co-infection with other pathogens not detected by the test. Clinical correlation with patient history and other diagnostic information is necessary to determine patient infection status. The expected result is Negative.  Fact Sheet for Patients:  PinkCheek.be  Fact Sheet for Healthcare Providers: GravelBags.it  This test is not yet approved or cleared by the [place] FDA and  has been authorized for detection and/or diagnosis of SARS-CoV-2 by FDA under an Emergency Use Authorization (EUA).  This EUA will remain in effect (meaning this test can b e used) for the duration of  the COVID-19 declaration under Section 564(b)(1) of the Act, 21 U.S.C. section 360bbb-3(b)(1), unless the authorization is terminated or revoked sooner.      Influenza A by PCR NEGATIVE NEGATIVE Final   Influenza B by PCR NEGATIVE NEGATIVE Final    Comment: (NOTE) The Xpert Xpress SARS-CoV-2/FLU/RSV assay is intended as an aid in  the diagnosis of influenza from Nasopharyngeal swab specimens and  should not be used as a sole basis for treatment. Nasal washings and  aspirates are unacceptable for Xpert Xpress SARS-CoV-2/FLU/RSV  testing.  Fact Sheet for  Patients: PinkCheek.be  Fact Sheet for Healthcare Providers: GravelBags.it  This test is not yet approved or cleared by the [place] FDA and  has been authorized for detection and/or diagnosis of SARS-CoV-2 by  FDA under an Emergency Use Authorization (EUA). This EUA will remain  in effect (meaning this test can be used) for the duration of the  Covid-19 declaration under Section 564(b)(1) of the Act, 21  U.S.C. section 360bbb-3(b)(1), unless the authorization is  terminated or revoked. Performed at [facility], [address] [place], [place] [address]   Blood Culture (routine x 2)     Status: None   Collection Time: 06/29/20  8:15 PM   Specimen: BLOOD  Result Value Ref Range Status   Specimen Description BLOOD RIGHT ARM  Final   Special Requests   Final    BOTTLES DRAWN AEROBIC AND ANAEROBIC Blood Culture results may not be optimal due to an excessive volume of blood received in culture bottles   Culture   Final    NO GROWTH 5 DAYS Performed at [facility], [place] [address] [place], [place] [address]    Report Status 07/04/2020 FINAL  Final  Blood Culture (routine x 2)     Status: None   Collection Time: 06/29/20  8:29 PM   Specimen: BLOOD RIGHT HAND  Result Value Ref Range Status   Specimen Description BLOOD RIGHT HAND  Final   Special Requests   Final    BOTTLES DRAWN AEROBIC ONLY Blood Culture adequate volume   Culture   Final    NO GROWTH 5 DAYS Performed at [facility], [place] [address], [place],  [address]    Report Status 07/04/2020 FINAL  Final       Today   Subjective:   Sherrell Puller today has no headache,no chest pain, no abdominal pain, reports her dyspnea has improved, and mainly exertional.    Objective:   Blood pressure 123/82, pulse (!) 56, temperature 98 F (36.7 C), temperature source Oral, resp. rate (!) 22, height _0  (1.676 m), weight 75.7 kg, SpO2 93  %.   Intake/Output Summary (Last 24 hours) at 07/04/2020 1340 Last data filed at 07/04/2020 0932 Gross per 24 hour  Intake 100 ml  Output --  Net 100 ml    Exam Awake Alert, Oriented x 3,  No new F.N deficits, Normal affect Symmetrical Chest wall movement, Good air movement bilaterally, CTAB RRR,No Gallops,Rubs or new Murmurs, No Parasternal Heave +ve B.Sounds, Abd Soft, Non tender. No Cyanosis, Clubbing or edema, No new Rash or bruise  Data Review   CBC w Diff:  Lab Results  Component Value Date   WBC 10.8 (H) 07/04/2020   HGB 13.2 07/04/2020   HCT 40.4 07/04/2020   PLT 350 07/04/2020   LYMPHOPCT 7 07/04/2020   MONOPCT 4 07/04/2020   EOSPCT 0 07/04/2020   BASOPCT 0 07/04/2020    CMP:  Lab Results  Component Value Date   NA 134 (L) 07/04/2020   K 4.7 07/04/2020   CL 105 07/04/2020   CO2 17 (L) 07/04/2020   BUN 46 (H) 07/04/2020   CREATININE 1.61 (H) 07/04/2020   CREATININE 2.13 (H) 05/07/2020   PROT 5.9 (L) 07/04/2020   ALBUMIN 2.4 (L) 07/04/2020   BILITOT 0.7 07/04/2020   ALKPHOS 43 07/04/2020   AST 26 07/04/2020   ALT 19 07/04/2020  .   Total Time in preparing paper work, data evaluation and todays exam - 66 minutes  Phillips Climes M.D on 07/04/2020 at 1:40 PM  [facility]   Office  [phone]

## 2020-07-07 ENCOUNTER — Telehealth: Payer: Self-pay

## 2020-07-07 NOTE — Telephone Encounter (Signed)
Transition Care Management Follow-up Telephone Call  Date of discharge and from where: 07/04/2020 [facility]   How have you been since you were released from the hospital? Weak   Any questions or concerns? No  Items Reviewed:  Did the pt receive and understand the discharge instructions provided? Yes   Medications obtained and verified? Yes   Other? No   Any new allergies since your discharge? No   Dietary orders reviewed? Yes heart healthy, carb modified and aspiration precautions  Do you have support at home? Yes grand daughter there to help her   Home Care and Equipment/Supplies: Were home health services ordered? yes If so, what is the name of the agency? [facility]  Has the agency set up a time to come to the patient's home? no Were any new equipment or medical supplies ordered?  No What is the name of the medical supply agency? n/a Were you able to get the supplies/equipment? not applicable Do you have any questions related to the use of the equipment or supplies? No  Functional Questionnaire: (I = Independent and D = Dependent) ADLs: I  Bathing/Dressing- I  Meal Prep- D  Eating- I  Maintaining continence- I   Transferring/Ambulation- I   Managing Meds- I   Follow up appointments reviewed:   PCP Hospital f/u appt confirmed? Yes  Scheduled to see Cherly [place]  on 12/2 @ 3.00.  [facility] f/u appt confirmed? No   Are transportation arrangements needed? No has to be virtual visit   If their condition worsens, is the pt aware to call PCP or go to the Emergency Dept.? Yes  Was the patient provided with contact information for the PCP's office or ED? Yes  Was to pt encouraged to call back with questions or concerns? Yes

## 2020-07-10 ENCOUNTER — Other Ambulatory Visit: Payer: Self-pay | Admitting: Family Medicine

## 2020-07-10 DIAGNOSIS — U071 COVID-19: Secondary | ICD-10-CM | POA: Diagnosis not present

## 2020-07-13 DIAGNOSIS — U071 COVID-19: Secondary | ICD-10-CM | POA: Diagnosis not present

## 2020-07-13 DIAGNOSIS — M6281 Muscle weakness (generalized): Secondary | ICD-10-CM | POA: Diagnosis not present

## 2020-07-13 DIAGNOSIS — I10 Essential (primary) hypertension: Secondary | ICD-10-CM | POA: Diagnosis not present

## 2020-07-13 DIAGNOSIS — J9601 Acute respiratory failure with hypoxia: Secondary | ICD-10-CM | POA: Diagnosis not present

## 2020-07-16 ENCOUNTER — Encounter: Payer: Self-pay | Admitting: Internal Medicine

## 2020-07-16 ENCOUNTER — Other Ambulatory Visit: Payer: Self-pay

## 2020-07-16 ENCOUNTER — Ambulatory Visit (INDEPENDENT_AMBULATORY_CARE_PROVIDER_SITE_OTHER): Payer: PPO | Admitting: Internal Medicine

## 2020-07-16 VITALS — BP 104/50 | HR 80 | Resp 15

## 2020-07-16 DIAGNOSIS — Z09 Encounter for follow-up examination after completed treatment for conditions other than malignant neoplasm: Secondary | ICD-10-CM | POA: Diagnosis not present

## 2020-07-16 DIAGNOSIS — I5042 Chronic combined systolic (congestive) and diastolic (congestive) heart failure: Secondary | ICD-10-CM | POA: Diagnosis not present

## 2020-07-16 DIAGNOSIS — U071 COVID-19: Secondary | ICD-10-CM

## 2020-07-16 DIAGNOSIS — R5381 Other malaise: Secondary | ICD-10-CM

## 2020-07-16 DIAGNOSIS — Z952 Presence of prosthetic heart valve: Secondary | ICD-10-CM | POA: Diagnosis not present

## 2020-07-16 HISTORY — DX: COVID-19: U07.1

## 2020-07-16 NOTE — Patient Instructions (Addendum)
Please continue to take medications as prescribed.  Please get blood tests done in the next week.  It is safe for you to come out of quarantine now.  Please use the cane for walking support. Please continue to participate in physical therapy for strength training.  Please maintain simple sleep hygiene. - Maintain dark and non-noisy environment in the bedroom. - Please use the bedroom for sleep only. - Do not use electronic devices in the bedroom. - Please take dinner at least 2 hours before bedtime. - Please avoid caffeinated products in the evening, including coffee, soft drinks. - Please try to maintain the regular sleep-wake cycle - Go to bed and wake up at the same time.  Please maintain your appointment with Dr Moshe Cipro.

## 2020-07-16 NOTE — Assessment & Plan Note (Signed)
No signs of volume overload Continue Lasix for now

## 2020-07-16 NOTE — Assessment & Plan Note (Signed)
No overt signs of volume overload Continue Coreg, Lasix and Spironolactone S/p AICD placement

## 2020-07-16 NOTE — Assessment & Plan Note (Signed)
Due to recent hospitalization for acute hypoxic respiratory failure 2/2 to COVID infection Continue PT Can extend PT if needed

## 2020-07-16 NOTE — Progress Notes (Signed)
Established Patient Office Visit  Subjective:  Patient ID: [name] [name], female    DOB: 01/16/1946  Age: 74 y.o. MRN: [id number]  CC:  Chief Complaint  Patient presents with  . Transitions Of Care    admitted with covid 11/15  D/C 11/20   . 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  Patient was admitted between 11/15-11/24 acute hypoxic respiratory failure secondary to Covid infection.  She was given remdesivir and was placed on Decadron followed by short-term steroid in the outpatient setting.  She was discharged with home oxygen.  Today, she states that she has been doing well overall since being discharged.  She denies any episodes of fever.  Her cough is improving.  She has not been requiring home oxygen now and is able to ambulate without any dyspnea.  She denies any chest pain, palpitations or dizziness.  She has resumed her cardiac medications as advised from the hospital.  She complains of difficulty sleeping since being discharged.  She is not able to sleep at her regular time as her sleep-wake cycle is disrupted since the hospital admission.  Past Medical History:  Diagnosis Date  . Anxiety   . Aortic stenosis   . Arthritis   . Biventricular ICD (implantable cardioverter-defibrillator) in place    March 2021, [employer]  -[name]  . Chronic combined systolic and diastolic CHF (congestive heart failure) ([place])   . CKD (chronic kidney disease) stage 3, GFR 30-59 ml/min (HCC)   . Constipation   . Depression   . Diabetes mellitus, type 2 ([place])   . Essential hypertension    . Hyperlipidemia   . Incidental pulmonary nodule, > 62m and < 856m   7 x 4 mm LLL nodule  . LBBB (left bundle branch block)   . Nonischemic cardiomyopathy (HCC)    No significant obstructive CAD at heart catheterization 05/2014, LVEF 20%  . Obesity   . Pneumonia 2013  . 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 Symptomatic PVCs     Past Surgical History:  Procedure Laterality Date  . BIV ICD INSERTION CRT-D N/A 10/28/2019   Procedure: BIV ICD INSERTION CRT-D;  Surgeon: TaEvans LanceMD;  [facility];  Service: Cardiovascular;  Laterality: N/A;  . CARDIAC CATHETERIZATION    . CESAREAN SECTION  1987  . FLEXIBLE BRONCHOSCOPY N/A 01/14/2015   Procedure: FLEXIBLE BRONCHOSCOPY;  Surgeon: StMelrose NakayamaMD;  [facility] Service: Thoracic;  Laterality: N/A;  . LEFT AND RIGHT HEART CATHETERIZATION WITH CORONARY ANGIOGRAM N/A 12/05/2011   Procedure: LEFT AND RIGHT HEART CATHETERIZATION WITH CORONARY ANGIOGRAM;  Surgeon: ChBurnell BlanksMD;  [facility];  Service: Cardiovascular;  Laterality: N/A;  . LEFT AND RIGHT HEART CATHETERIZATION WITH CORONARY ANGIOGRAM N/A 05/23/2014   Procedure: LEFT AND RIGHT HEART CATHETERIZATION WITH CORONARY ANGIOGRAM;  Surgeon: ChBurnell BlanksMD;  [facility];  Service: Cardiovascular;  Laterality: N/A;  .  MULTIPLE EXTRACTIONS WITH ALVEOLOPLASTY N/A 02/27/2014   Procedure: Extraction of tooth #'s 2,4,5,6,7,8,9,10,11,12, 22, 23, 24, 25, 26, 28 with alveoloplasty and bilateral mandibular tori reductions;  Surgeon: [name], DDS;  Location: [place];  Service: Oral Surgery;  Laterality: N/A;  . RIGID BRONCHOSCOPY N/A 01/14/2015   Procedure: RIGID BRONCHOSCOPY with Removal Of Foreign Body ;  Surgeon: [name], MD;  Location: [place];  Service: Thoracic;  Laterality: N/A;  . TEE  WITHOUT CARDIOVERSION N/A 03/24/2015   Procedure: TRANSESOPHAGEAL ECHOCARDIOGRAM (TEE);  Surgeon: [name], MD;  Location: [place];  Service: Open Heart Surgery;  Laterality: N/A;  . TRANSCATHETER AORTIC VALVE REPLACEMENT, TRANSFEMORAL Bilateral 03/24/2015   Procedure: TRANSCATHETER AORTIC VALVE REPLACEMENT, TRANSFEMORAL;  Surgeon: [name], MD;  Location: [place];  Service: Open Heart Surgery;  Laterality: Bilateral;  . TUBAL LIGATION  1987  . VIDEO BRONCHOSCOPY Bilateral 06/05/2014   Procedure: VIDEO BRONCHOSCOPY WITHOUT FLUORO;  Surgeon: [name], MD;  Location: [facility];  Service: Cardiopulmonary;  Laterality: Bilateral;    Family History  Problem Relation Age of Onset  . Heart disease Mother   . Heart attack Mother 63  . Diabetes Mother   . Hypertension Mother   . Colon cancer Father 23  . Breast cancer Sister   . Diabetes Brother   . Hypertension Brother   . Stroke Brother   . Cancer Brother   . Congestive Heart Failure Daughter   . Hypertension Son   . High Cholesterol Son   . Congestive Heart Failure Son     Social History   Socioeconomic History  . Marital status: Widowed    Spouse name: Not on file  . Number of children: 5  . Years of education: Not on file  . Highest education level: Not on file  Occupational History  . Occupation: Retired  Tobacco Use  . Smoking status: Never Smoker  . Smokeless tobacco: Never Used  Vaping Use  . Vaping Use: Never used  Substance and Sexual Activity  . Alcohol use: No  . Drug use: No  . Sexual activity: Never    Birth control/protection: None, Post-menopausal  Other Topics Concern  . Not on file  Social History Narrative  . Not on file   Social Determinants of Health   Financial Resource Strain:   . Difficulty of Paying Living Expenses: Not on file  Food Insecurity:   . Worried About Charity fundraiser in the Last Year: Not on file  . Ran Out of Food in the Last Year: Not on file   Transportation Needs:   . Lack of Transportation (Medical): Not on file  . Lack of Transportation (Non-Medical): Not on file  Physical Activity:   . Days of Exercise per Week: Not on file  . Minutes of Exercise per Session: Not on file  Stress:   . Feeling of Stress : Not on file  Social Connections:   . Frequency of Communication with Friends and Family: Not on file  . Frequency of Social Gatherings with Friends and Family: Not on file  . Attends Religious Services: Not on file  . Active Member of Clubs or Organizations: Not on file  . Attends Archivist Meetings: Not on file  . Marital Status: Not on file  Intimate Partner Violence:   . Fear of Current or Ex-Partner: Not on file  . Emotionally Abused: Not on file  . Physically Abused: Not on file  . Sexually Abused:  Not on file    Outpatient Medications Prior to Visit  Medication Sig Dispense Refill  . acetaminophen (TYLENOL) 500 MG tablet Take one tablet two times daily, by mouth, for knee  pain (Patient taking differently: Take 500 mg by mouth 2 (two) times daily as needed for moderate pain. Take one tablet two times daily, by mouth, for knee  pain) 60 tablet 3  . aspirin EC 81 MG tablet Take 81 mg by mouth every morning.     . blood glucose meter kit and supplies Dispense based on patient and insurance preference. Use to test three times daily (FOR ICD-10 E10.9, E11.9). 1 each 0  . carvedilol (COREG) 6.25 MG tablet Take 1 tablet (6.25 mg total) by mouth 2 (two) times daily with a meal. 60 tablet 0  . colchicine 0.6 MG tablet Take 1 tablet (0.6 mg total) by mouth 2 (two) times daily. Take with food 60 tablet 5  . diclofenac Sodium (VOLTAREN) 1 % GEL Apply 1 application topically 4 (four) times daily as needed (pain).    Marland Kitchen ezetimibe (ZETIA) 10 MG tablet TAKE ONE TABLET BY MOUTH ONCE DAILY. (Patient taking differently: Take 10 mg by mouth daily. ) 90 tablet 0  . fenofibrate (TRICOR) 145 MG tablet TAKE (1) TABLET BY MOUTH  ONCE DAILY. (Patient taking differently: Take 145 mg by mouth daily. ) 30 tablet 0  . furosemide (LASIX) 40 MG tablet Take 0.5 tablets (20 mg total) by mouth daily. 30 tablet 3  . glipiZIDE (GLUCOTROL XL) 2.5 MG 24 hr tablet Take 1 tablet (2.5 mg total) by mouth daily with breakfast. 30 tablet 5  . Lancets (ONETOUCH DELICA PLUS [id number]) MISC USE 3 TIMES A DAY AS DIRECTED. (Patient taking differently: 1 each by Other route in the morning, at noon, and at bedtime. ) 100 each 5  . LITETOUCH PEN NEEDLES 31G X 8 MM MISC USE AS NEEDED TO INJECT INSULIN. 100 each 0  . LIVALO 2 MG TABS TAKE ONE TABLET BY MOUTH ONCE DAILY AFTER SUPPER. (Patient taking differently: Take 2 mg by mouth daily after supper. ) 90 tablet 0  . Multiple Vitamins-Minerals (MULTIVITAMIN PO) Take 1 tablet by mouth every morning.     Marland Kitchen NOVOLOG MIX 70/30 FLEXPEN (70-30) 100 UNIT/ML FlexPen INJECT 12 TO 15 UNITS SUBCUTANEOUSLY TWICE DAILY. (Patient taking differently: Inject 12-15 Units into the skin 2 (two) times daily. ) 15 mL 0  . ONETOUCH ULTRA test strip CHECK BLOOD SUGAR 3 TIMES A DAY. (Patient taking differently: 1 each by Other route in the morning, at noon, and at bedtime. ) 100 strip 5  . pantoprazole (PROTONIX) 40 MG tablet Take 1 tablet (40 mg total) by mouth daily. 30 tablet 0  . potassium chloride (KLOR-CON) 10 MEQ tablet TAKE ONE TABLET BY MOUTH DAILY. DO NOT TAKE IF NOT TAKING FUROSEMIDE. (Patient taking differently: Take 10 mEq by mouth daily. DO NOT TAKE IF NOT TAKING FUROSEMIDE.) 30 tablet 0  . spironolactone (ALDACTONE) 25 MG tablet Take 1 tablet (25 mg total) by mouth daily. 90 tablet 0  . UNABLE TO FIND Shower bench x 1   DX M17.12 1 each 0  . UNABLE TO FIND Rolling walker with seat x 1  DX m17.12 1 each 0   No facility-administered medications prior to visit.    Allergies  Allergen Reactions  . Crestor [Rosuvastatin] Other (See Comments)    Muscle aches and elevated CK  . Tramadol Nausea Only    States  blood  sugar elevated also  . Penicillins Rash and Other (See Comments)    Family unsure of reaction, but certain mom said she was allergic Did it involve swelling of the face/tongue/throat, SOB, or low BP? No Did it involve sudden or severe rash/hives, skin peeling, or any reaction on the inside of your mouth or nose? Yes Did you need to seek medical attention at a hospital or doctor's office? Yes When did it last happen?10 + years If all above answers are "NO", may proceed with cephalosporin use.     ROS Review of Systems  Constitutional: Positive for fatigue. Negative for chills and fever.  HENT: Negative for congestion, sinus pressure, sinus pain and sore throat.   Eyes: Negative for pain and discharge.  Respiratory: Positive for cough. Negative for shortness of breath.   Cardiovascular: Negative for chest pain and palpitations.  Gastrointestinal: Negative for abdominal pain, constipation, diarrhea, nausea and vomiting.  Endocrine: Negative for polydipsia and polyuria.  Genitourinary: Negative for dysuria and hematuria.  Musculoskeletal: Negative for neck pain and neck stiffness.  Skin: Negative for rash.  Neurological: Negative for dizziness, weakness, numbness and headaches.  Psychiatric/Behavioral: Positive for sleep disturbance. Negative for agitation and behavioral problems. The patient is not nervous/anxious.       Objective:    Physical Exam Vitals reviewed.  Constitutional:      General: She is not in acute distress.    Appearance: She is not diaphoretic.  HENT:     Head: Normocephalic and atraumatic.     Nose: Nose normal. No congestion.     Mouth/Throat:     Mouth: Mucous membranes are moist.     Pharynx: No posterior oropharyngeal erythema.  Eyes:     General: No scleral icterus.    Extraocular Movements: Extraocular movements intact.     Pupils: Pupils are equal, round, and reactive to light.  Cardiovascular:     Rate and Rhythm: Normal rate and  regular rhythm.     Pulses: Normal pulses.     Heart sounds: Normal heart sounds. No murmur heard.   Pulmonary:     Breath sounds: No wheezing or rales.     Comments: Mildly decreased breath sounds b/l Abdominal:     Palpations: Abdomen is soft.     Tenderness: There is no abdominal tenderness.  Musculoskeletal:     Cervical back: Neck supple. No tenderness.     Right lower leg: No edema.     Left lower leg: No edema.  Skin:    General: Skin is warm.     Findings: No rash.  Neurological:     General: No focal deficit present.     Mental Status: She is alert and oriented to person, place, and time.  Psychiatric:        Mood and Affect: Mood normal.        Behavior: Behavior normal.     BP (!) 104/50   Pulse 80   Resp 15   SpO2 93%  Wt Readings from Last 3 Encounters:  07/04/20 166 lb 14.2 oz (75.7 kg)  06/24/20 176 lb (79.8 kg)  05/11/20 175 lb 1.9 oz (79.4 kg)     Health Maintenance Due  Topic Date Due  . COVID-19 Vaccine (1) Never done  . COLONOSCOPY  Never done  . DEXA SCAN  Never done  . OPHTHALMOLOGY EXAM  05/31/2017  . URINE MICROALBUMIN  08/19/2020    There are no preventive care reminders to display for this patient.  Lab Results  Component Value Date   TSH 1.95 02/13/2018   Lab Results  Component Value Date   WBC 10.8 (H) 07/04/2020   HGB 13.2 07/04/2020   HCT 40.4 07/04/2020   MCV 83.5 07/04/2020   PLT 350 07/04/2020   Lab Results  Component Value Date   NA 134 (L) 07/04/2020   K 4.7 07/04/2020   CO2 17 (L) 07/04/2020   GLUCOSE 238 (H) 07/04/2020   BUN 46 (H) 07/04/2020   CREATININE 1.61 (H) 07/04/2020   BILITOT 0.7 07/04/2020   ALKPHOS 43 07/04/2020   AST 26 07/04/2020   ALT 19 07/04/2020   PROT 5.9 (L) 07/04/2020   ALBUMIN 2.4 (L) 07/04/2020   CALCIUM 8.9 07/04/2020   ANIONGAP 12 07/04/2020   Lab Results  Component Value Date   CHOL 168 08/20/2019   Lab Results  Component Value Date   HDL 58 08/20/2019   Lab Results   Component Value Date   LDLCALC 94 08/20/2019   Lab Results  Component Value Date   TRIG 209 (H) 06/29/2020   Lab Results  Component Value Date   CHOLHDL 2.9 08/20/2019   Lab Results  Component Value Date   HGBA1C 7.4 (H) 05/07/2020      Assessment & Plan:   Problem List Items Addressed This Visit      Hospital discharge follow-up - [facility] chart reviewed including discharge summary S/p Remdesevir and Decadron Has home O2 setup, has not been hypoxic at home, does not require O2 at home. Resume cardiac medications Check CBC, CMP and D-dimer     Relevant Orders  CBC with Differential  CMP14+EGFR  COVID-19 virus infection   S/p hospitalization Not hypoxic Afebrile now, cough improved Okay to come out of quarantine now     Relevant Orders  D-Dimer, Quantitative  Physical deconditioning   Due to recent hospitalization for acute hypoxic respiratory failure 2/2 to COVID infection Continue PT Can extend PT if needed      Cardiovascular and Mediastinum   Chronic combined systolic and diastolic CHF (congestive heart failure) (HCC) (Chronic)    No overt signs of volume overload Continue Coreg, Lasix and Spironolactone S/p AICD placement        Other   S/P TAVR (transcatheter aortic valve replacement)    No signs of volume overload Continue Lasix for now       No orders of the defined types were placed in this encounter.   Follow-up: Return if symptoms worsen or fail to improve.    Lindell Spar, MD

## 2020-07-16 NOTE — Assessment & Plan Note (Signed)
S/p hospitalization Not hypoxic Afebrile now, cough improved Okay to come out of quarantine now

## 2020-07-16 NOTE — Assessment & Plan Note (Signed)
Hospital chart reviewed including discharge summary S/p Remdesevir and Decadron Has home O2 setup, has not been hypoxic at home, does not require O2 at home. Resume cardiac medications Check CBC, CMP and D-dimer

## 2020-07-17 DIAGNOSIS — U071 COVID-19: Secondary | ICD-10-CM | POA: Diagnosis not present

## 2020-07-17 DIAGNOSIS — I10 Essential (primary) hypertension: Secondary | ICD-10-CM | POA: Diagnosis not present

## 2020-07-17 DIAGNOSIS — M6281 Muscle weakness (generalized): Secondary | ICD-10-CM | POA: Diagnosis not present

## 2020-07-17 DIAGNOSIS — J9601 Acute respiratory failure with hypoxia: Secondary | ICD-10-CM | POA: Diagnosis not present

## 2020-07-20 DIAGNOSIS — Z09 Encounter for follow-up examination after completed treatment for conditions other than malignant neoplasm: Secondary | ICD-10-CM | POA: Diagnosis not present

## 2020-07-20 DIAGNOSIS — U071 COVID-19: Secondary | ICD-10-CM | POA: Diagnosis not present

## 2020-07-21 ENCOUNTER — Other Ambulatory Visit: Payer: Self-pay | Admitting: Family Medicine

## 2020-07-21 DIAGNOSIS — I152 Hypertension secondary to endocrine disorders: Secondary | ICD-10-CM | POA: Diagnosis not present

## 2020-07-21 DIAGNOSIS — Z9981 Dependence on supplemental oxygen: Secondary | ICD-10-CM | POA: Diagnosis not present

## 2020-07-21 DIAGNOSIS — Z952 Presence of prosthetic heart valve: Secondary | ICD-10-CM | POA: Diagnosis not present

## 2020-07-21 DIAGNOSIS — I447 Left bundle-branch block, unspecified: Secondary | ICD-10-CM | POA: Diagnosis not present

## 2020-07-21 DIAGNOSIS — F419 Anxiety disorder, unspecified: Secondary | ICD-10-CM | POA: Diagnosis not present

## 2020-07-21 DIAGNOSIS — F32A Depression, unspecified: Secondary | ICD-10-CM | POA: Diagnosis not present

## 2020-07-21 DIAGNOSIS — E1169 Type 2 diabetes mellitus with other specified complication: Secondary | ICD-10-CM | POA: Diagnosis not present

## 2020-07-21 DIAGNOSIS — Z7982 Long term (current) use of aspirin: Secondary | ICD-10-CM | POA: Diagnosis not present

## 2020-07-21 DIAGNOSIS — E1159 Type 2 diabetes mellitus with other circulatory complications: Secondary | ICD-10-CM | POA: Diagnosis not present

## 2020-07-21 DIAGNOSIS — Z794 Long term (current) use of insulin: Secondary | ICD-10-CM | POA: Diagnosis not present

## 2020-07-21 DIAGNOSIS — Z7984 Long term (current) use of oral hypoglycemic drugs: Secondary | ICD-10-CM | POA: Diagnosis not present

## 2020-07-21 DIAGNOSIS — I5042 Chronic combined systolic (congestive) and diastolic (congestive) heart failure: Secondary | ICD-10-CM | POA: Diagnosis not present

## 2020-07-21 DIAGNOSIS — Z6828 Body mass index (BMI) 28.0-28.9, adult: Secondary | ICD-10-CM | POA: Diagnosis not present

## 2020-07-21 DIAGNOSIS — Z95810 Presence of automatic (implantable) cardiac defibrillator: Secondary | ICD-10-CM | POA: Diagnosis not present

## 2020-07-21 DIAGNOSIS — R911 Solitary pulmonary nodule: Secondary | ICD-10-CM | POA: Diagnosis not present

## 2020-07-21 DIAGNOSIS — J1282 Pneumonia due to coronavirus disease 2019: Secondary | ICD-10-CM | POA: Diagnosis not present

## 2020-07-21 DIAGNOSIS — M1990 Unspecified osteoarthritis, unspecified site: Secondary | ICD-10-CM | POA: Diagnosis not present

## 2020-07-21 DIAGNOSIS — K5900 Constipation, unspecified: Secondary | ICD-10-CM | POA: Diagnosis not present

## 2020-07-21 DIAGNOSIS — E785 Hyperlipidemia, unspecified: Secondary | ICD-10-CM | POA: Diagnosis not present

## 2020-07-21 DIAGNOSIS — E1122 Type 2 diabetes mellitus with diabetic chronic kidney disease: Secondary | ICD-10-CM | POA: Diagnosis not present

## 2020-07-21 DIAGNOSIS — N184 Chronic kidney disease, stage 4 (severe): Secondary | ICD-10-CM | POA: Diagnosis not present

## 2020-07-21 DIAGNOSIS — E669 Obesity, unspecified: Secondary | ICD-10-CM | POA: Diagnosis not present

## 2020-07-21 DIAGNOSIS — I428 Other cardiomyopathies: Secondary | ICD-10-CM | POA: Diagnosis not present

## 2020-07-21 DIAGNOSIS — J9601 Acute respiratory failure with hypoxia: Secondary | ICD-10-CM | POA: Diagnosis not present

## 2020-07-21 DIAGNOSIS — U071 COVID-19: Secondary | ICD-10-CM | POA: Diagnosis not present

## 2020-07-22 ENCOUNTER — Telehealth: Payer: Self-pay

## 2020-07-22 LAB — CMP14+EGFR
ALT: 25 IU/L (ref 0–32)
AST: 31 IU/L (ref 0–40)
Albumin/Globulin Ratio: 1.2 (ref 1.2–2.2)
Albumin: 3 g/dL — ABNORMAL LOW (ref 3.7–4.7)
Alkaline Phosphatase: 48 IU/L (ref 44–121)
BUN/Creatinine Ratio: 17 (ref 12–28)
BUN: 24 mg/dL (ref 8–27)
Bilirubin Total: 0.4 mg/dL (ref 0.0–1.2)
CO2: 23 mmol/L (ref 20–29)
Calcium: 8.8 mg/dL (ref 8.7–10.3)
Chloride: 106 mmol/L (ref 96–106)
Creatinine, Ser: 1.43 mg/dL — ABNORMAL HIGH (ref 0.57–1.00)
GFR calc Af Amer: 41 mL/min/{1.73_m2} — ABNORMAL LOW (ref >59)
GFR calc non Af Amer: 36 mL/min/{1.73_m2} — ABNORMAL LOW (ref >59)
Globulin, Total: 2.6 g/dL (ref 1.5–4.5)
Glucose: 173 mg/dL — ABNORMAL HIGH (ref 65–99)
Potassium: 4.1 mmol/L (ref 3.5–5.2)
Sodium: 147 mmol/L — ABNORMAL HIGH (ref 134–144)
Total Protein: 5.6 g/dL — ABNORMAL LOW (ref 6.0–8.5)

## 2020-07-22 LAB — CBC WITH DIFFERENTIAL/PLATELET
Basophils Absolute: 0.1 10*3/uL (ref 0.0–0.2)
Basos: 1 %
EOS (ABSOLUTE): 0.5 10*3/uL — ABNORMAL HIGH (ref 0.0–0.4)
Eos: 9 %
Hematocrit: 34.4 % (ref 34.0–46.6)
Hemoglobin: 11.2 g/dL (ref 11.1–15.9)
Immature Grans (Abs): 0 10*3/uL (ref 0.0–0.1)
Immature Granulocytes: 0 %
Lymphocytes Absolute: 1.2 10*3/uL (ref 0.7–3.1)
Lymphs: 23 %
MCH: 28.1 pg (ref 26.6–33.0)
MCHC: 32.6 g/dL (ref 31.5–35.7)
MCV: 86 fL (ref 79–97)
Monocytes Absolute: 0.5 10*3/uL (ref 0.1–0.9)
Monocytes: 9 %
Neutrophils Absolute: 3.1 10*3/uL (ref 1.4–7.0)
Neutrophils: 58 %
Platelets: 184 10*3/uL (ref 150–450)
RBC: 3.98 x10E6/uL (ref 3.77–5.28)
RDW: 15.8 % — ABNORMAL HIGH (ref 11.7–15.4)
WBC: 5.4 10*3/uL (ref 3.4–10.8)

## 2020-07-22 LAB — D-DIMER, QUANTITATIVE: D-DIMER: 1.67 mg/L FEU — ABNORMAL HIGH (ref 0.00–0.49)

## 2020-07-22 NOTE — Telephone Encounter (Signed)
I called her to confirm whether she had any leg swelling or if she had any breathing problems since she was discharged. Her D-dimer was elevated, but it is a nonspecific inflammatory marker and is expected to be elevated in the postinfectious phase. As long as she is not having any symptoms of leg swelling or dyspnea, we would observe it for now.

## 2020-07-22 NOTE — Telephone Encounter (Signed)
Son called and said that the patient said she spoke with the dr but she wasn't too sure what he said about her labs and she was worried so the son is calling and asking what was said so he can explain it to her to ease her mind. Please advise and I will call him back with the info.

## 2020-07-23 ENCOUNTER — Telehealth: Payer: Self-pay | Admitting: *Deleted

## 2020-07-23 NOTE — Telephone Encounter (Signed)
Pt called wanting to know if you would call her son Linton Rump [phone] and explain her blood work to him as she didn't quite understand it

## 2020-07-23 NOTE — Telephone Encounter (Signed)
Patient son aware

## 2020-07-27 ENCOUNTER — Other Ambulatory Visit: Payer: Self-pay | Admitting: Family Medicine

## 2020-07-27 ENCOUNTER — Ambulatory Visit (INDEPENDENT_AMBULATORY_CARE_PROVIDER_SITE_OTHER): Payer: PPO

## 2020-07-27 DIAGNOSIS — I428 Other cardiomyopathies: Secondary | ICD-10-CM

## 2020-07-27 LAB — CUP PACEART REMOTE DEVICE CHECK
Battery Remaining Longevity: 101 mo
Battery Voltage: 2.99 V
Brady Statistic AP VP Percent: 22.59 %
Brady Statistic AP VS Percent: 0.83 %
Brady Statistic AS VP Percent: 74.86 %
Brady Statistic AS VS Percent: 1.72 %
Brady Statistic RA Percent Paced: 22.59 %
Brady Statistic RV Percent Paced: 0.98 %
Date Time Interrogation Session: 20211213002203
HighPow Impedance: 64 Ohm
Implantable Lead Implant Date: 20210315
Implantable Lead Implant Date: 20210315
Implantable Lead Implant Date: 20210315
Implantable Lead Location: 753858
Implantable Lead Location: 753859
Implantable Lead Location: 753860
Implantable Lead Model: 4598
Implantable Lead Model: 5076
Implantable Lead Model: 6935
Implantable Pulse Generator Implant Date: 20210315
Lead Channel Impedance Value: 184.154
Lead Channel Impedance Value: 188.1 Ohm
Lead Channel Impedance Value: 188.1 Ohm
Lead Channel Impedance Value: 204.14 Ohm
Lead Channel Impedance Value: 209 Ohm
Lead Channel Impedance Value: 342 Ohm
Lead Channel Impedance Value: 342 Ohm
Lead Channel Impedance Value: 361 Ohm
Lead Channel Impedance Value: 399 Ohm
Lead Channel Impedance Value: 399 Ohm
Lead Channel Impedance Value: 418 Ohm
Lead Channel Impedance Value: 418 Ohm
Lead Channel Impedance Value: 532 Ohm
Lead Channel Impedance Value: 646 Ohm
Lead Channel Impedance Value: 665 Ohm
Lead Channel Impedance Value: 665 Ohm
Lead Channel Impedance Value: 722 Ohm
Lead Channel Impedance Value: 722 Ohm
Lead Channel Pacing Threshold Amplitude: 0.625 V
Lead Channel Pacing Threshold Amplitude: 0.625 V
Lead Channel Pacing Threshold Amplitude: 0.75 V
Lead Channel Pacing Threshold Pulse Width: 0.4 ms
Lead Channel Pacing Threshold Pulse Width: 0.4 ms
Lead Channel Pacing Threshold Pulse Width: 0.4 ms
Lead Channel Sensing Intrinsic Amplitude: 2 mV
Lead Channel Sensing Intrinsic Amplitude: 2 mV
Lead Channel Sensing Intrinsic Amplitude: 20.75 mV
Lead Channel Sensing Intrinsic Amplitude: 20.75 mV
Lead Channel Setting Pacing Amplitude: 1.5 V
Lead Channel Setting Pacing Amplitude: 2 V
Lead Channel Setting Pacing Amplitude: 2.25 V
Lead Channel Setting Pacing Pulse Width: 0.4 ms
Lead Channel Setting Pacing Pulse Width: 0.4 ms
Lead Channel Setting Sensing Sensitivity: 0.3 mV

## 2020-07-28 ENCOUNTER — Telehealth: Payer: Self-pay

## 2020-07-28 NOTE — Telephone Encounter (Signed)
Scheduled transmission received, Optivol level above threshold.  Thoracic impedence does appear to be trending up.  Patient activity level decreased in November/ early december related to COVID infection.    Spoke with pt, she denies any s/s of fluid retention.  She reports that her exercise tolerance has been gradually improving and she is able to walk to mailbox etc.. without SOB.  She denies any current LE edema.  Pt confirmed she is taking Lasix 20mg  daily as ordered.

## 2020-07-29 ENCOUNTER — Other Ambulatory Visit: Payer: Self-pay | Admitting: Family Medicine

## 2020-07-29 ENCOUNTER — Telehealth (INDEPENDENT_AMBULATORY_CARE_PROVIDER_SITE_OTHER): Payer: PPO | Admitting: Internal Medicine

## 2020-07-29 ENCOUNTER — Other Ambulatory Visit: Payer: Self-pay

## 2020-07-29 ENCOUNTER — Encounter: Payer: Self-pay | Admitting: Internal Medicine

## 2020-07-29 ENCOUNTER — Telehealth: Payer: Self-pay

## 2020-07-29 VITALS — HR 82

## 2020-07-29 DIAGNOSIS — E1169 Type 2 diabetes mellitus with other specified complication: Secondary | ICD-10-CM | POA: Diagnosis not present

## 2020-07-29 DIAGNOSIS — I5042 Chronic combined systolic (congestive) and diastolic (congestive) heart failure: Secondary | ICD-10-CM | POA: Diagnosis not present

## 2020-07-29 DIAGNOSIS — I428 Other cardiomyopathies: Secondary | ICD-10-CM | POA: Diagnosis not present

## 2020-07-29 DIAGNOSIS — U071 COVID-19: Secondary | ICD-10-CM | POA: Diagnosis not present

## 2020-07-29 DIAGNOSIS — I447 Left bundle-branch block, unspecified: Secondary | ICD-10-CM | POA: Diagnosis not present

## 2020-07-29 DIAGNOSIS — J309 Allergic rhinitis, unspecified: Secondary | ICD-10-CM | POA: Diagnosis not present

## 2020-07-29 DIAGNOSIS — N184 Chronic kidney disease, stage 4 (severe): Secondary | ICD-10-CM | POA: Diagnosis not present

## 2020-07-29 DIAGNOSIS — I152 Hypertension secondary to endocrine disorders: Secondary | ICD-10-CM | POA: Diagnosis not present

## 2020-07-29 DIAGNOSIS — F419 Anxiety disorder, unspecified: Secondary | ICD-10-CM

## 2020-07-29 DIAGNOSIS — J9601 Acute respiratory failure with hypoxia: Secondary | ICD-10-CM | POA: Diagnosis not present

## 2020-07-29 DIAGNOSIS — F32A Depression, unspecified: Secondary | ICD-10-CM

## 2020-07-29 DIAGNOSIS — E1122 Type 2 diabetes mellitus with diabetic chronic kidney disease: Secondary | ICD-10-CM | POA: Diagnosis not present

## 2020-07-29 DIAGNOSIS — E785 Hyperlipidemia, unspecified: Secondary | ICD-10-CM | POA: Diagnosis not present

## 2020-07-29 DIAGNOSIS — J1282 Pneumonia due to coronavirus disease 2019: Secondary | ICD-10-CM | POA: Diagnosis not present

## 2020-07-29 DIAGNOSIS — E1159 Type 2 diabetes mellitus with other circulatory complications: Secondary | ICD-10-CM | POA: Diagnosis not present

## 2020-07-29 DIAGNOSIS — M1990 Unspecified osteoarthritis, unspecified site: Secondary | ICD-10-CM

## 2020-07-29 MED ORDER — CETIRIZINE HCL 10 MG PO TABS: 10.0000 mg | ORAL_TABLET | Freq: Every day | ORAL | 1 refills | Status: DC

## 2020-07-29 MED ORDER — AZELASTINE HCL 0.1 % NA SOLN: 2.0000 | Freq: Two times a day (BID) | NASAL | 12 refills | Status: DC

## 2020-07-29 NOTE — Patient Instructions (Signed)
Please start taking Cetirizine as prescribed.  Please use Azelastine nasal spray as needed for nasal congestion and itchiness.  Please avoid cold exposure.  Continue to participate in home physical therapy.  Please contact us if you have fever, chills, sore throat or have persistent symptoms after 5 days.

## 2020-07-29 NOTE — Progress Notes (Signed)
Virtual Visit via Telephone Note   This visit type was conducted due to national recommendations for restrictions regarding the COVID-19 Pandemic (e.g. social distancing) in an effort to limit this patient's exposure and mitigate transmission in our community.  Due to her co-morbid illnesses, this patient is at least at moderate risk for complications without adequate follow up.  This format is felt to be most appropriate for this patient at this time.  The patient did not have access to video technology/had technical difficulties with video requiring transitioning to audio format only (telephone).  All issues noted in this document were discussed and addressed.  No physical exam could be performed with this format.  Evaluation Performed:  Follow-up visit  Date:  07/29/2020   ID:  [name] [name], [name] [name] 08-Apr-1946, MRN [id number]  Patient Location: Home Provider Location: Office/Clinic  Participants: Patient Location of Patient: Home Location of Provider: Telehealth Consent was obtain for visit to be over via telehealth. I verified that I am speaking with the correct person using two identifiers.  PCP:  Fayrene Helper, MD   Chief Complaint:  Runny nose and itchiness  History of Present Illness:    [name] [name] is a 74 y.o. 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 Patient states that one of the vents in her home was left open and she was exposed to cold air overnight few days ago. She states that since then, she has been having stuffy nose, but denies any fever, chills, sore throat, dyspnea or wheezing. She used to take Cetirizine in the past for allergic rhinitis.  Patient has been participating in PT since being discharged. Her O2 sat was around 96% upon ambulation  during PT. She states that her other vitals also remain stable during PT. She feels well overall.  The patient does not have symptoms concerning for COVID-19 infection (fever, chills, cough, or new shortness of breath).   Past Medical, Surgical, Social History, Allergies, and Medications have been Reviewed.  Past Medical History:  Diagnosis Date  . Anxiety   . Aortic stenosis   . Arthritis   . Biventricular ICD (implantable cardioverter-defibrillator) in place    March 2021, [employer]  -[name]  . Chronic combined systolic and diastolic CHF (congestive heart failure) ([place])   . CKD (chronic kidney disease) stage 3, GFR 30-59 ml/min (HCC)   . Constipation   . Depression   . Diabetes mellitus, type 2 ([place])   . Essential hypertension   . Hyperlipidemia   . Incidental pulmonary nodule, > 72m and < 871m   7 x 4 mm LLL nodule  . LBBB (left bundle branch block)   . Nonischemic cardiomyopathy (HCC)    No significant obstructive CAD at heart catheterization 05/2014, LVEF 20%  . Obesity   . Pneumonia 2013  . 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 Symptomatic PVCs    Past Surgical History:  Procedure Laterality Date  . BIV ICD INSERTION CRT-D N/A 10/28/2019   Procedure: BIV ICD INSERTION CRT-D;  Surgeon: TaEvans LanceMD;  [facility];  Service: Cardiovascular;  Laterality: N/A;  . CARDIAC CATHETERIZATION    . CESAREAN SECTION  [place] N/A 01/14/2015   Procedure: FLEXIBLE BRONCHOSCOPY;  Surgeon: [name], MD;  Location: [place];  Service: Thoracic;  Laterality: N/A;  . LEFT AND RIGHT HEART CATHETERIZATION WITH CORONARY ANGIOGRAM N/A 12/05/2011   Procedure: LEFT AND RIGHT HEART CATHETERIZATION WITH CORONARY ANGIOGRAM;  Surgeon: [name], MD;  [facility] C[facility] LAB;  Service: Cardiovascular;  Laterality: N/A;   . LEFT AND RIGHT HEART CATHETERIZATION WITH CORONARY ANGIOGRAM N/A 05/23/2014   Procedure: LEFT AND RIGHT HEART CATHETERIZATION WITH CORONARY ANGIOGRAM;  Surgeon: [name], MD;  Location: [facility] CATH LAB;  Service: Cardiovascular;  Laterality: N/A;  . MULTIPLE EXTRACTIONS WITH ALVEOLOPLASTY N/A 02/27/2014   Procedure: Extraction of tooth #'s 2,4,5,6,7,8,9,10,11,12, 22, 23, 24, 25, 26, 28 with alveoloplasty and bilateral mandibular tori reductions;  Surgeon: [name], DDS;  Location: [place];  Service: Oral Surgery;  Laterality: N/A;  . RIGID BRONCHOSCOPY N/A 01/14/2015   Procedure: RIGID BRONCHOSCOPY with Removal Of Foreign Body ;  Surgeon: [name], MD;  Location: [place];  Service: Thoracic;  Laterality: N/A;  . TEE WITHOUT CARDIOVERSION N/A 03/24/2015   Procedure: TRANSESOPHAGEAL ECHOCARDIOGRAM (TEE);  Surgeon: [name], MD;  Location: [place];  Service: Open Heart Surgery;  Laterality: N/A;  . TRANSCATHETER AORTIC VALVE REPLACEMENT, TRANSFEMORAL Bilateral 03/24/2015   Procedure: TRANSCATHETER AORTIC VALVE REPLACEMENT, TRANSFEMORAL;  Surgeon: [name], MD;  Location: [place];  Service: Open Heart Surgery;  Laterality: Bilateral;  . TUBAL LIGATION  1987  . VIDEO BRONCHOSCOPY Bilateral 06/05/2014   Procedure: VIDEO BRONCHOSCOPY WITHOUT FLUORO;  Surgeon: [name], MD;  Location: [facility] ENDOSCOPY;  Service: Cardiopulmonary;  Laterality: Bilateral;     Current Meds  Medication Sig  . acetaminophen (TYLENOL) 500 MG tablet Take one tablet two times daily, by mouth, for knee  pain (Patient taking differently: Take 500 mg by mouth 2 (two) times daily as needed for moderate pain. Take one tablet two times daily, by mouth, for knee  pain)  . aspirin EC 81 MG tablet Take 81 mg by mouth every morning.   . blood glucose meter kit and supplies Dispense based on patient and insurance preference. Use to test three times daily (FOR ICD-10 E10.9, E11.9).  .  carvedilol (COREG) 6.25 MG tablet Take 1 tablet (6.25 mg total) by mouth 2 (two) times daily with a meal.  . colchicine 0.6 MG tablet Take 1 tablet (0.6 mg total) by mouth 2 (two) times daily. Take with food  . diclofenac Sodium (VOLTAREN) 1 % GEL Apply 1 application topically 4 (four) times daily as needed (pain).  Marland Kitchen ezetimibe (ZETIA) 10 MG tablet TAKE ONE TABLET BY MOUTH ONCE DAILY.  . fenofibrate (TRICOR) 145 MG tablet TAKE (1) TABLET BY MOUTH ONCE DAILY.  . furosemide (LASIX) 40 MG tablet Take 0.5 tablets (20 mg total) by mouth daily.  Marland Kitchen glipiZIDE (GLUCOTROL XL) 2.5 MG 24 hr tablet TAKE ONE TABLET BY MOUTH ONCE DAILY WITHBREAKFAST.  Marland Kitchen Lancets (ONETOUCH DELICA PLUS [id number]) MISC USE 3 TIMES A DAY AS DIRECTED. (Patient taking differently: 1 each by Other route in the morning, at noon, and at bedtime.)  . LITETOUCH PEN NEEDLES 31G X 8 MM MISC USE AS NEEDED TO INJECT INSULIN.  Marland Kitchen LIVALO 2 MG TABS TAKE ONE TABLET BY MOUTH ONCE DAILY AFTER SUPPER. (Patient taking differently: Take 2 mg by mouth daily after supper.)  . Multiple Vitamins-Minerals (MULTIVITAMIN PO) Take 1 tablet by mouth every morning.   Marland Kitchen  NOVOLOG MIX 70/30 FLEXPEN (70-30) 100 UNIT/ML FlexPen INJECT 12 TO 15 UNITS SUBCUTANEOUSLY TWICE DAILY. (Patient taking differently: Inject 12-15 Units into the skin 2 (two) times daily.)  . ONETOUCH ULTRA test strip CHECK BLOOD SUGAR 3 TIMES A DAY.  . pantoprazole (PROTONIX) 40 MG tablet Take 1 tablet (40 mg total) by mouth daily.  . potassium chloride (KLOR-CON) 10 MEQ tablet TAKE ONE TABLET BY MOUTH DAILY. DO NOT TAKE IF NOT TAKING FUROSEMIDE.  Marland Kitchen spironolactone (ALDACTONE) 25 MG tablet TAKE ONE TABLET BY MOUTH ONCE DAILY.  Marland Kitchen UNABLE TO FIND Shower bench x 1   DX M17.12  . UNABLE TO FIND Rolling walker with seat x 1  DX m17.12     Allergies:   Crestor [rosuvastatin], Tramadol, and Penicillins   ROS:   Please see the history of present illness. Review of Systems  Constitutional: Negative  for chills and fever.  HENT: Positive for congestion. Negative for ear discharge, ear pain, sinus pain and sore throat.   Eyes: Negative for pain and redness.  Respiratory: Negative for cough and shortness of breath.   Cardiovascular: Negative for chest pain and palpitations.  Gastrointestinal: Negative for constipation, diarrhea, nausea and vomiting.  Genitourinary: Negative for dysuria and hematuria.  Musculoskeletal: Negative for myalgias and neck pain.  Skin: Negative for rash.  Neurological: Negative for dizziness, sensory change, focal weakness and headaches.  Psychiatric/Behavioral: Negative for depression and suicidal ideas. The patient has insomnia.      Labs/Other Tests and Data Reviewed:    Recent Labs: 07/04/2020: B Natriuretic Peptide 1,026.8; Magnesium 2.7 07/20/2020: ALT 25; BUN 24; Creatinine, Ser 1.43; Hemoglobin 11.2; Platelets 184; Potassium 4.1; Sodium 147   Recent Lipid Panel Lab Results  Component Value Date/Time   CHOL 168 08/20/2019 10:01 AM   TRIG 209 (H) 06/29/2020 08:29 PM   HDL 58 08/20/2019 10:01 AM   CHOLHDL 2.9 08/20/2019 10:01 AM   LDLCALC 94 08/20/2019 10:01 AM    Wt Readings from Last 3 Encounters:  07/04/20 166 lb 14.2 oz (75.7 kg)  06/24/20 176 lb (79.8 kg)  05/11/20 175 lb 1.9 oz (79.4 kg)     ASSESSMENT & PLAN:    Acute rhinitis Viral vs allergic Prescribed Zyrtec Azelastine nasal spray Advised to contact if she has fever, chills, sore throat or persistent symptoms  Recent COVID infection Feeling well, participating in home PT, no cough or dyspnea  Time:   Today, I have spent 22 minutes reviewing the chart, including problem list, medications, and with the patient with telehealth technology discussing the above problems.   Medication Adjustments/Labs and Tests Ordered: Current medicines are reviewed at length with the patient today.  Concerns regarding medicines are outlined above.   Tests Ordered: No orders of the defined  types were placed in this encounter.   Medication Changes: Meds ordered this encounter  Medications  . azelastine (ASTELIN) 0.1 % nasal spray    Sig: Place 2 sprays into both nostrils 2 (two) times daily. Use in each nostril as directed    Dispense:  30 mL    Refill:  12  . cetirizine (ZYRTEC) 10 MG tablet    Sig: Take 1 tablet (10 mg total) by mouth daily.    Dispense:  30 tablet    Refill:  1     Note: This dictation was prepared with Dragon dictation along with smaller phrase technology. Similar sounding words can be transcribed inadequately or may not be corrected upon review. Any transcriptional errors that result  from this process are unintentional.      Disposition:  Follow up  Signed, Lindell Spar, MD  07/29/2020 3:39 PM     [place]

## 2020-07-29 NOTE — Telephone Encounter (Signed)
Patient called wanting medication for a runny nose/cough. appt scheduled for patient for a phone visit w/ Dr.Patel

## 2020-07-31 ENCOUNTER — Other Ambulatory Visit: Payer: Self-pay | Admitting: Family Medicine

## 2020-07-31 NOTE — Telephone Encounter (Signed)
ASk patient to take an extra lasix for 3 days.

## 2020-08-05 DIAGNOSIS — F419 Anxiety disorder, unspecified: Secondary | ICD-10-CM

## 2020-08-05 DIAGNOSIS — N184 Chronic kidney disease, stage 4 (severe): Secondary | ICD-10-CM | POA: Diagnosis not present

## 2020-08-05 DIAGNOSIS — E1169 Type 2 diabetes mellitus with other specified complication: Secondary | ICD-10-CM | POA: Diagnosis not present

## 2020-08-05 DIAGNOSIS — M1990 Unspecified osteoarthritis, unspecified site: Secondary | ICD-10-CM

## 2020-08-05 DIAGNOSIS — I428 Other cardiomyopathies: Secondary | ICD-10-CM | POA: Diagnosis not present

## 2020-08-05 DIAGNOSIS — F32A Depression, unspecified: Secondary | ICD-10-CM

## 2020-08-05 DIAGNOSIS — E785 Hyperlipidemia, unspecified: Secondary | ICD-10-CM | POA: Diagnosis not present

## 2020-08-05 DIAGNOSIS — E1159 Type 2 diabetes mellitus with other circulatory complications: Secondary | ICD-10-CM | POA: Diagnosis not present

## 2020-08-05 DIAGNOSIS — E1122 Type 2 diabetes mellitus with diabetic chronic kidney disease: Secondary | ICD-10-CM | POA: Diagnosis not present

## 2020-08-05 DIAGNOSIS — I5042 Chronic combined systolic (congestive) and diastolic (congestive) heart failure: Secondary | ICD-10-CM | POA: Diagnosis not present

## 2020-08-05 DIAGNOSIS — U071 COVID-19: Secondary | ICD-10-CM | POA: Diagnosis not present

## 2020-08-05 DIAGNOSIS — J9601 Acute respiratory failure with hypoxia: Secondary | ICD-10-CM | POA: Diagnosis not present

## 2020-08-05 DIAGNOSIS — I152 Hypertension secondary to endocrine disorders: Secondary | ICD-10-CM | POA: Diagnosis not present

## 2020-08-05 DIAGNOSIS — I447 Left bundle-branch block, unspecified: Secondary | ICD-10-CM | POA: Diagnosis not present

## 2020-08-05 DIAGNOSIS — J1282 Pneumonia due to coronavirus disease 2019: Secondary | ICD-10-CM | POA: Diagnosis not present

## 2020-08-09 DIAGNOSIS — U071 COVID-19: Secondary | ICD-10-CM | POA: Diagnosis not present

## 2020-08-11 NOTE — Progress Notes (Signed)
Remote ICD transmission.   

## 2020-08-12 ENCOUNTER — Other Ambulatory Visit: Payer: Self-pay | Admitting: Family Medicine

## 2020-08-19 ENCOUNTER — Other Ambulatory Visit: Payer: Self-pay | Admitting: Family Medicine

## 2020-08-25 ENCOUNTER — Encounter: Payer: Self-pay | Admitting: Cardiology

## 2020-08-25 ENCOUNTER — Ambulatory Visit: Payer: PPO | Admitting: Cardiology

## 2020-08-25 ENCOUNTER — Other Ambulatory Visit: Payer: Self-pay

## 2020-08-25 VITALS — BP 130/86 | HR 74 | Ht 66.0 in | Wt 175.6 lb

## 2020-08-25 DIAGNOSIS — Z952 Presence of prosthetic heart valve: Secondary | ICD-10-CM | POA: Diagnosis not present

## 2020-08-25 DIAGNOSIS — I5042 Chronic combined systolic (congestive) and diastolic (congestive) heart failure: Secondary | ICD-10-CM

## 2020-08-25 NOTE — Progress Notes (Signed)
Cardiology Office Note  Date: 08/25/2020   ID: Merrie, Epler [birth year], MRN [id number]  PCP:  Fayrene Helper, MD  Cardiologist:  Rozann Lesches, MD Electrophysiologist:  None   Chief Complaint  Patient presents with  . Cardiac follow-up    History of Present Illness: [name] [name] is a 75 y.o. female last seen by Ms. Strader PA-C in August 2021.  She is here today with her son for a follow-up visit.  Overall reports stable NYHA class II dyspnea, no palpitations or chest pain, no syncope.  She follows with [name], [employer] CRT-D in place.  Device interrogation showed normal function, frequent PVCs.  Thoracic impedance did suggest some fluid accumulation.  Recent weight is stable however.  She continues on Lasix 40 mg daily with potassium supplement.  No peripheral edema evident today.  She was hospitalized in November 2021 with COVID-pneumonia.  She states that she has gotten back to her former baseline.  I reviewed her echocardiogram from May of last year as noted below.  We went over her medications today.  She has not had follow-up with Nephrology recently.  Past Medical History:  Diagnosis Date  . Anxiety   . Aortic stenosis   . Arthritis   . Biventricular ICD (implantable cardioverter-defibrillator) in place    March 2021, [employer]  -[name]  . Chronic combined systolic and diastolic CHF (congestive heart failure) ([place])   . CKD (chronic kidney disease) stage 3, GFR 30-59 ml/min (HCC)   . Constipation   . Depression   . Diabetes mellitus, type 2 ([place])   . Essential hypertension   . Hyperlipidemia   . Incidental pulmonary nodule, > 70m and < 863m   7 x 4 mm LLL nodule  . LBBB (left bundle branch block)   . Nonischemic cardiomyopathy (HCC)    No significant obstructive CAD at heart catheterization 05/2014, LVEF 20%  . Obesity   . Pneumonia 2013  . 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 Symptomatic PVCs     Past Surgical History:  Procedure Laterality Date  . BIV ICD INSERTION CRT-D N/A 10/28/2019   Procedure: BIV ICD INSERTION CRT-D;  Surgeon: TaEvans LanceMD;  [facility];  Service: Cardiovascular;  Laterality: N/A;  . CARDIAC CATHETERIZATION    . CESAREAN SECTION  1987  . FLEXIBLE BRONCHOSCOPY N/A 01/14/2015   Procedure: FLEXIBLE BRONCHOSCOPY;  Surgeon: StMelrose NakayamaMD;  [facility] Service: Thoracic;  Laterality: N/A;  . LEFT AND RIGHT HEART CATHETERIZATION WITH CORONARY ANGIOGRAM N/A 12/05/2011   Procedure: LEFT AND RIGHT HEART CATHETERIZATION WITH CORONARY ANGIOGRAM;  Surgeon: ChBurnell BlanksMD;  [facility];  Service: Cardiovascular;  Laterality: N/A;  . LEFT AND RIGHT HEART CATHETERIZATION WITH CORONARY ANGIOGRAM N/A 05/23/2014   Procedure: LEFT AND RIGHT HEART CATHETERIZATION WITH CORONARY ANGIOGRAM;  Surgeon: ChBurnell BlanksMD;  [facility];  Service: Cardiovascular;  Laterality: N/A;  . MULTIPLE EXTRACTIONS WITH ALVEOLOPLASTY N/A 02/27/2014   Procedure: Extraction of tooth #'s 2,4,5,6,7,8,9,10,11,12, 22, 23, 24, 25, 26, 28 with alveoloplasty and bilateral mandibular tori reductions;  Surgeon: RoLenn CalDDS;  [facility] Service: Oral Surgery;  Laterality: N/A;  . RIGID BRONCHOSCOPY N/A 01/14/2015   Procedure: RIGID BRONCHOSCOPY with Removal Of Foreign Body ;  Surgeon: StMelrose NakayamaMD;  [facility];  Service: Thoracic;  Laterality: N/A;  . TEE WITHOUT CARDIOVERSION N/A 03/24/2015   Procedure: TRANSESOPHAGEAL ECHOCARDIOGRAM (TEE);  Surgeon: [name], MD;  Location: [place];  Service: Open Heart Surgery;  Laterality: N/A;  . TRANSCATHETER AORTIC VALVE REPLACEMENT, TRANSFEMORAL Bilateral 03/24/2015   Procedure: TRANSCATHETER AORTIC VALVE REPLACEMENT, TRANSFEMORAL;  Surgeon: [name],  MD;  Location: [place];  Service: Open Heart Surgery;  Laterality: Bilateral;  . TUBAL LIGATION  1987  . VIDEO BRONCHOSCOPY Bilateral 06/05/2014   Procedure: VIDEO BRONCHOSCOPY WITHOUT FLUORO;  Surgeon: [name], MD;  Location: [facility] ENDOSCOPY;  Service: Cardiopulmonary;  Laterality: Bilateral;    Current Outpatient Medications  Medication Sig Dispense Refill  . acetaminophen (TYLENOL) 500 MG tablet Take one tablet two times daily, by mouth, for knee  pain (Patient taking differently: Take 500 mg by mouth 2 (two) times daily as needed for moderate pain. Take one tablet two times daily, by mouth, for knee  pain) 60 tablet 3  . aspirin EC 81 MG tablet Take 81 mg by mouth every morning.     . blood glucose meter kit and supplies Dispense based on patient and insurance preference. Use to test three times daily (FOR ICD-10 E10.9, E11.9). 1 each 0  . carvedilol (COREG) 25 MG tablet Take 25 mg by mouth 2 (two) times daily.    . cetirizine (ZYRTEC) 10 MG tablet Take 1 tablet (10 mg total) by mouth daily. 30 tablet 1  . colchicine 0.6 MG tablet Take 1 tablet (0.6 mg total) by mouth 2 (two) times daily. Take with food 60 tablet 5  . diclofenac Sodium (VOLTAREN) 1 % GEL Apply 1 application topically 4 (four) times daily as needed (pain).    Marland Kitchen ezetimibe (ZETIA) 10 MG tablet TAKE ONE TABLET BY MOUTH ONCE DAILY. 90 tablet 0  . fenofibrate (TRICOR) 145 MG tablet TAKE (1) TABLET BY MOUTH ONCE DAILY. 30 tablet 0  . furosemide (LASIX) 40 MG tablet TAKE (1) TABLET BY MOUTH EACH MORNING. 30 tablet 0  . glipiZIDE (GLUCOTROL XL) 2.5 MG 24 hr tablet TAKE ONE TABLET BY MOUTH ONCE DAILY WITH BREAKFAST. 30 tablet 0  . Lancets (ONETOUCH DELICA PLUS [id number]) MISC USE 3 TIMES A DAY AS DIRECTED. 100 each 0  . LITETOUCH PEN NEEDLES 31G X 8 MM MISC USE AS NEEDED TO INJECT INSULIN. 100 each 0  . LIVALO 2 MG TABS TAKE ONE TABLET BY MOUTH ONCE DAILY AFTER SUPPER. (Patient taking differently: Take 2 mg by mouth daily after  supper.) 90 tablet 0  . Multiple Vitamins-Minerals (MULTIVITAMIN PO) Take 1 tablet by mouth every morning.     Marland Kitchen NOVOLOG MIX 70/30 FLEXPEN (70-30) 100 UNIT/ML FlexPen INJECT 12 TO 15 UNITS SUBCUTANEOUSLY TWICE DAILY. 15 mL 0  . ONETOUCH ULTRA test strip CHECK BLOOD SUGAR 3 TIMES A DAY. 100 strip 0  . potassium chloride (KLOR-CON) 10 MEQ tablet TAKE ONE TABLET BY MOUTH DAILY. DO NOT TAKE IF NOT TAKING FUROSEMIDE. 30 tablet 0  . spironolactone (ALDACTONE) 25 MG tablet TAKE ONE TABLET BY MOUTH ONCE DAILY. 90 tablet 0  . UNABLE TO FIND Shower bench x 1   DX M17.12 1 each 0  . UNABLE TO FIND Rolling walker with seat x 1  DX m17.12 1 each 0   No current facility-administered medications for this visit.   Allergies:  Crestor [rosuvastatin], Tramadol, and Penicillins   ROS: No orthopnea or PND.  Physical Exam: VS:  BP 130/86  Pulse 74   Ht 5' 6"  (1.676 m)   Wt 175 lb 9.6 oz (79.7 kg)   SpO2 99%   BMI 28.34 kg/m , BMI Body mass index is 28.34 kg/m.  Wt Readings from Last 3 Encounters:  08/25/20 175 lb 9.6 oz (79.7 kg)  07/04/20 166 lb 14.2 oz (75.7 kg)  06/24/20 176 lb (79.8 kg)    General: Patient appears comfortable at rest. HEENT: Conjunctiva and lids normal, wearing a mask. Neck: Supple, no elevated JVP or carotid bruits, no thyromegaly. Lungs: Clear to auscultation, nonlabored breathing at rest. Cardiac: Regular rate and rhythm, no S3, 2/6 systolic murmur, no pericardial rub. Extremities: No pitting edema.  ECG:  An ECG dated 06/29/2020 was personally reviewed today and demonstrated:  Ventricular paced rhythm with atrial tracking  Recent Labwork: 07/04/2020: B Natriuretic Peptide 1,026.8; Magnesium 2.7 07/20/2020: ALT 25; AST 31; BUN 24; Creatinine, Ser 1.43; Hemoglobin 11.2; Platelets 184; Potassium 4.1; Sodium 147     Component Value Date/Time   CHOL 168 08/20/2019 1001   TRIG 209 (H) 06/29/2020 2029   HDL 58 08/20/2019 1001   CHOLHDL 2.9 08/20/2019 1001   VLDL 21  02/06/2017 0916   LDLCALC 94 08/20/2019 1001    Other Studies Reviewed Today:  Echocardiogram 01/02/2020: 1. Left ventricular ejection fraction, by estimation, is 20 to 25%. The  left ventricle has severely decreased function. The left ventricle  demonstrates global hypokinesis. There is moderate left ventricular  hypertrophy. Left ventricular diastolic  parameters are consistent with Grade I diastolic dysfunction (impaired  relaxation).  2. Right ventricular systolic function is normal. The right ventricular  size is normal. There is normal pulmonary artery systolic pressure.  3. The mitral valve is normal in structure. Mild mitral valve  regurgitation. No evidence of mitral stenosis.  4. 23 mm Edwards Sapien 3 transcatheter heart valve is in the AV  position. The aortic valve has been repaired/replaced. Aortic valve  regurgitation is not visualized. No aortic stenosis is present.  5. The inferior vena cava is normal in size with greater than 50%  respiratory variability, suggesting right atrial pressure of 3 mmHg.   Assessment and Plan:  1. Nonischemic cardiomyopathy, LVEF 20 to 25% by last assessment. RV contraction normal.  She has a [employer] CRT-D in place.  She is currently on Coreg, Aldactone, Lasix, and potassium supplement.  Follow-up echocardiogram in May, might be able to start low-dose BiDil, she was somewhat hesitant to make any additions today.  We have not pursued ARB/ANRI with CKD stage IIIb.  2. Aortic stenosis status post TAVR in 2016.  Follow-up echocardiogram to be obtained in May of this year.  3. CKD stage IIIb, keep follow-up with Nephrology.  Medication Adjustments/Labs and Tests Ordered: Current medicines are reviewed at length with the patient today.  Concerns regarding medicines are outlined above.   Tests Ordered: Orders Placed This Encounter  Procedures  . ECHOCARDIOGRAM COMPLETE    Medication Changes: No orders of the defined types were  placed in this encounter.   Disposition:  Follow up after echocardiogram for review.  Signed, [name], MD, [facility] 08/25/2020 11:42 AM    [facility]. [address] [place], [place] [address] Phone: [phone]; Fax: [phone]

## 2020-08-25 NOTE — Patient Instructions (Signed)
Medication Instructions:  Your physician recommends that you continue on your current medications as directed. Please refer to the Current Medication list given to you today.  *If you need a refill on your cardiac medications before your next appointment, please call your pharmacy*   Lab Work: None today If you have labs (blood work) drawn today and your tests are completely normal, you will receive your results only by: Marland Kitchen MyChart Message (if you have MyChart) OR . A paper copy in the mail If you have any lab test that is abnormal or we need to change your treatment, we will call you to review the results.   Testing/Procedures: Your physician has requested that you have an echocardiogram in MAY. Echocardiography is a painless test that uses sound waves to create images of your heart. It provides your doctor with information about the size and shape of your heart and how well your heart's chambers and valves are working. This procedure takes approximately one hour. There are no restrictions for this procedure.     Follow-Up: At [facility], you and your health needs are our priority.  As part of our continuing mission to provide you with exceptional heart care, we have created designated Provider Care Teams.  These Care Teams include your primary Cardiologist (physician) and Advanced Practice Providers (APPs -  Physician Assistants and Nurse Practitioners) who all work together to provide you with the care you need, when you need it.  We recommend signing up for the patient portal called "MyChart".  Sign up information is provided on this After Visit Summary.  MyChart is used to connect with patients for Virtual Visits (Telemedicine).  Patients are able to view lab/test results, encounter notes, upcoming appointments, etc.  Non-urgent messages can be sent to your provider as well.   To learn more about what you can do with MyChart, go to [email or web address].    Your next appointment:    4 month(s) in May after you have your echocardiogram.   The format for your next appointment:   In Person  Provider:   Rozann Lesches, MD   Other Instructions None      Thank you for choosing [facility] !

## 2020-08-26 ENCOUNTER — Other Ambulatory Visit: Payer: Self-pay | Admitting: Family Medicine

## 2020-09-01 ENCOUNTER — Encounter: Payer: Self-pay | Admitting: Family Medicine

## 2020-09-01 ENCOUNTER — Other Ambulatory Visit: Payer: Self-pay

## 2020-09-01 ENCOUNTER — Telehealth (INDEPENDENT_AMBULATORY_CARE_PROVIDER_SITE_OTHER): Payer: PPO | Admitting: Family Medicine

## 2020-09-01 VITALS — Ht 66.0 in | Wt 175.0 lb

## 2020-09-01 DIAGNOSIS — Z135 Encounter for screening for eye and ear disorders: Secondary | ICD-10-CM

## 2020-09-01 DIAGNOSIS — Z0000 Encounter for general adult medical examination without abnormal findings: Secondary | ICD-10-CM

## 2020-09-01 NOTE — Progress Notes (Addendum)
Subjective:   [name] [name] is a 75 y.o. female who presents for Medicare Annual (Subsequent) preventive examination.  Participants: Nurse for intake and work up; Patient and Provider for Visit and Wrap up  Method of visit: Telephone  Location of Patient: Home Location of Provider: Office Consent was obtain for visit over the telephone. Services rendered by provider: Visit was performed via telephone   I verified that I am speaking with the correct person using two identifiers.     Review of Systems Cardiac Risk Factors include: advanced age (>39mn, >>66women);diabetes mellitus     Objective:    Today's Vitals   09/01/20 1104 09/01/20 1105  Weight: 175 lb (79.4 kg)   Height: 5' 6"  (1.676 m)   PainSc: 0-No pain 0-No pain   Body mass index is 28.25 kg/m.  Advanced Directives 09/01/2020 06/30/2020 06/29/2020 06/27/2020 06/24/2020 12/05/2019 10/21/2019  Does Patient Have a Medical Advance Directive? No No No No No No No  Copy of Healthcare Power of Attorney in Chart? - - - - - - -  Would patient like information on creating a medical advance directive? No - Patient declined No - Patient declined - No - Patient declined - - -  Pre-existing out of facility DNR order (yellow form or pink MOST form) - - - - - - -    Current Medications (verified) Outpatient Encounter Medications as of 09/01/2020  Medication Sig  . acetaminophen (TYLENOL) 500 MG tablet Take one tablet two times daily, by mouth, for knee  pain (Patient taking differently: Take 500 mg by mouth 2 (two) times daily as needed for moderate pain. Take one tablet two times daily, by mouth, for knee  pain)  . aspirin EC 81 MG tablet Take 81 mg by mouth every morning.   . blood glucose meter kit and supplies Dispense based on patient and insurance preference. Use to test three times daily (FOR ICD-10 E10.9, E11.9).  . carvedilol (COREG) 25 MG tablet Take 25 mg by mouth 2 (two) times daily.  . cetirizine (ZYRTEC) 10 MG  tablet Take 1 tablet (10 mg total) by mouth daily.  . colchicine 0.6 MG tablet Take 1 tablet (0.6 mg total) by mouth 2 (two) times daily. Take with food  . diclofenac Sodium (VOLTAREN) 1 % GEL Apply 1 application topically 4 (four) times daily as needed (pain).  .Marland Kitchenezetimibe (ZETIA) 10 MG tablet TAKE ONE TABLET BY MOUTH ONCE DAILY.  . fenofibrate (TRICOR) 145 MG tablet TAKE (1) TABLET BY MOUTH ONCE DAILY.  . furosemide (LASIX) 40 MG tablet TAKE (1) TABLET BY MOUTH EACH MORNING.  .Marland KitchenglipiZIDE (GLUCOTROL XL) 2.5 MG 24 hr tablet TAKE ONE TABLET BY MOUTH ONCE DAILY WITH BREAKFAST.  .Marland KitchenLancets (ONETOUCH DELICA PLUS LBWLSLH73S MISC USE 3 TIMES A DAY AS DIRECTED.  .Marland KitchenLITETOUCH PEN NEEDLES 31G X 8 MM MISC USE AS NEEDED TO INJECT INSULIN.  .Marland KitchenLIVALO 2 MG TABS TAKE ONE TABLET BY MOUTH ONCE DAILY AFTER SUPPER.  . Multiple Vitamins-Minerals (MULTIVITAMIN PO) Take 1 tablet by mouth every morning.   .Marland KitchenNOVOLOG MIX 70/30 FLEXPEN (70-30) 100 UNIT/ML FlexPen INJECT 12 TO 15 UNITS SUBCUTANEOUSLY TWICE DAILY.  .Marland KitchenONETOUCH ULTRA test strip CHECK BLOOD SUGAR 3 TIMES A DAY.  .Marland Kitchenpotassium chloride (KLOR-CON) 10 MEQ tablet TAKE ONE TABLET BY MOUTH DAILY. DO NOT TAKE IF NOT TAKING FUROSEMIDE.  .Marland Kitchenspironolactone (ALDACTONE) 25 MG tablet TAKE ONE TABLET BY MOUTH ONCE DAILY.  .Marland KitchenUNABLE TO FIND  Shower bench x 1   DX M17.12  . UNABLE TO FIND Rolling walker with seat x 1  DX m17.12  . [DISCONTINUED] ferrous sulfate 325 (65 FE) MG EC tablet Take 1 tablet (325 mg total) by mouth 2 (two) times daily.  . [DISCONTINUED] FLUoxetine (PROZAC) 10 MG tablet Take 1 tablet (10 mg total) by mouth daily. Take one capsule by mouth once a day   No facility-administered encounter medications on file as of 09/01/2020.    Allergies (verified) Crestor [rosuvastatin], Tramadol, and Penicillins   History: Past Medical History:  Diagnosis Date  . Anxiety   . Aortic stenosis   . Arthritis   . Biventricular ICD (implantable  cardioverter-defibrillator) in place    March 2021, [employer]  -[name]  . Chronic combined systolic and diastolic CHF (congestive heart failure) ([place])   . CKD (chronic kidney disease) stage 3, GFR 30-59 ml/min (HCC)   . Constipation   . Depression   . Diabetes mellitus, type 2 ([place])   . Essential hypertension   . Hyperlipidemia   . Incidental pulmonary nodule, > 68m and < 879m   7 x 4 mm LLL nodule  . LBBB (left bundle branch block)   . Nonischemic cardiomyopathy (HCC)    No significant obstructive CAD at heart catheterization 05/2014, LVEF 20%  . Obesity   . Pneumonia 2013  . 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 Symptomatic PVCs    Past Surgical History:  Procedure Laterality Date  . BIV ICD INSERTION CRT-D N/A 10/28/2019   Procedure: BIV ICD INSERTION CRT-D;  Surgeon: TaEvans LanceMD;  [facility];  Service: Cardiovascular;  Laterality: N/A;  . CARDIAC CATHETERIZATION    . CESAREAN SECTION  1987  . FLEXIBLE BRONCHOSCOPY N/A 01/14/2015   Procedure: FLEXIBLE BRONCHOSCOPY;  Surgeon: StMelrose NakayamaMD;  [facility]: Thoracic;  Laterality: N/A;  . LEFT AND RIGHT HEART CATHETERIZATION WITH CORONARY ANGIOGRAM N/A 12/05/2011   Procedure: LEFT AND RIGHT HEART CATHETERIZATION WITH CORONARY ANGIOGRAM;  Surgeon: ChBurnell BlanksMD;  [facility];  Service: Cardiovascular;  Laterality: N/A;  . LEFT AND RIGHT HEART CATHETERIZATION WITH CORONARY ANGIOGRAM N/A 05/23/2014   Procedure: LEFT AND RIGHT HEART CATHETERIZATION WITH CORONARY ANGIOGRAM;  Surgeon: ChBurnell BlanksMD;  [facility];  Service: Cardiovascular;  Laterality: N/A;  . MULTIPLE EXTRACTIONS WITH ALVEOLOPLASTY N/A 02/27/2014   Procedure: Extraction of tooth #'s 2,4,5,6,7,8,9,10,11,12, 22, 23, 24, 25, 26, 28 with alveoloplasty and bilateral  mandibular tori reductions;  Surgeon: RoLenn CalDDS;  [facility]: Oral Surgery;  Laterality: N/A;  . RIGID BRONCHOSCOPY N/A 01/14/2015   Procedure: RIGID BRONCHOSCOPY with Removal Of Foreign Body ;  Surgeon: StMelrose NakayamaMD;  [facility]: Thoracic;  Laterality: N/A;  . TEE WITHOUT CARDIOVERSION N/A 03/24/2015   Procedure: TRANSESOPHAGEAL ECHOCARDIOGRAM (TEE);  Surgeon: ChBurnell BlanksMD;  [facility]: Open Heart Surgery;  Laterality: N/A;  . TRANSCATHETER AORTIC VALVE REPLACEMENT, TRANSFEMORAL Bilateral 03/24/2015   Procedure: TRANSCATHETER AORTIC VALVE REPLACEMENT, TRANSFEMORAL;  Surgeon: ChBurnell BlanksMD;  [facility]: Open Heart Surgery;  Laterality: Bilateral;  . TUBAL LIGATION  1987  . VIDEO BRONCHOSCOPY Bilateral 06/05/2014   Procedure: VIDEO BRONCHOSCOPY WITHOUT FLUORO;  Surgeon: DoJuanito DoomMD;  [facility];  Service: Cardiopulmonary;  Laterality: Bilateral;   Family History  Problem Relation Age of Onset  . Heart disease Mother   . Heart attack Mother 70  . Diabetes Mother   . Hypertension Mother   . Colon cancer Father 46  . Breast cancer Sister   . Diabetes Brother   . Hypertension Brother   . Stroke Brother   . Cancer Brother   . Congestive Heart Failure Daughter   . Hypertension Son   . High Cholesterol Son   . Congestive Heart Failure Son    Social History   Socioeconomic History  . Marital status: Widowed    Spouse name: Not on file  . Number of children: 5  . Years of education: Not on file  . Highest education level: Not on file  Occupational History  . Occupation: Retired  Tobacco Use  . Smoking status: Never Smoker  . Smokeless tobacco: Never Used  Vaping Use  . Vaping Use: Never used  Substance and Sexual Activity  . Alcohol use: No  . Drug use: No  . Sexual activity: Never    Birth control/protection: None, Post-menopausal  Other Topics Concern   . Not on file  Social History Narrative  . Not on file   Social Determinants of Health   Financial Resource Strain: Low Risk   . Difficulty of Paying Living Expenses: Not hard at all  Food Insecurity: No Food Insecurity  . Worried About Charity fundraiser in the Last Year: Never true  . Ran Out of Food in the Last Year: Never true  Transportation Needs: No Transportation Needs  . Lack of Transportation (Medical): No  . Lack of Transportation (Non-Medical): No  Physical Activity: Insufficiently Active  . Days of Exercise per Week: 7 days  . Minutes of Exercise per Session: 20 min  Stress: No Stress Concern Present  . Feeling of Stress : Not at all  Social Connections: Moderately Isolated  . Frequency of Communication with Friends and Family: More than three times a week  . Frequency of Social Gatherings with Friends and Family: More than three times a week  . Attends Religious Services: More than 4 times per year  . Active Member of Clubs or Organizations: No  . Attends Archivist Meetings: Never  . Marital Status: Widowed    Tobacco Counseling Counseling given: Yes   Clinical Intake:  Pre-visit preparation completed: Yes  Pain : No/denies pain Pain Score: 0-No pain     BMI - recorded: 28.2 Nutritional Status: BMI 25 -29 Overweight Nutritional Risks: None Diabetes: Yes CBG done?: No Did pt. bring in CBG monitor from home?: No  How often do you need to have someone help you when you read instructions, pamphlets, or other written materials from your doctor or pharmacy?: 1 - Never What is the last grade level you completed in school?: 11  Diabetic? yes  Interpreter Needed?: No  Information entered by :: Laretta Bolster, LPN   Activities of Daily Living In your present state of health, do you have any difficulty performing the following activities: 09/01/2020 06/30/2020  Hearing? N N  Vision? N N  Difficulty concentrating or making decisions? N N   Walking or climbing stairs? N N  Dressing or bathing? N N  Doing errands, shopping? N N  Preparing Food and eating ? N -  Using the Toilet? N -  In the past six months, have you accidently leaked urine? N -  Do you  have problems with loss of bowel control? N -  Managing your Medications? N -  Managing your Finances? N -  Housekeeping or managing your Housekeeping? N -  Some recent data might be hidden    Patient Care Team: Fayrene Helper, MD as PCP - General [name] Aloha Gell, MD as PCP - Cardiology (Cardiology) Evans Lance, MD (Cardiology) [name], MD as Consulting Physician (Cardiology)  Indicate any recent Medical Services you may have received from other than [facility] providers in the past year (date may be approximate).     Assessment:   This is a routine wellness examination for [facility].  Hearing/Vision screen No exam data present  Dietary issues and exercise activities discussed: Current Exercise Habits: Home exercise routine, Type of exercise: walking, Time (Minutes): 20, Frequency (Times/Week): 7, Weekly Exercise (Minutes/Week): 140, Intensity: Mild  Goals    . Exercise 3x per week (30 min per time)     Patient would like to increase her exercise to 4 days a weeks for 20-30 minutes at a time.       Depression Screen PHQ 2/9 Scores 09/01/2020 07/29/2020 05/11/2020 01/28/2020 03/13/2019 02/06/2019 01/30/2019  PHQ - 2 Score 0 0 0 0 1 1 0  PHQ- 9 Score - - - - 2 1 -    Fall Risk Fall Risk  09/01/2020 07/29/2020 05/11/2020 01/28/2020 08/22/2019  Falls in the past year? 0 0 1 1 0  Number falls in past yr: 0 0 1 1 0  Injury with Fall? 0 0 0 0 0  Risk for fall due to : No Fall Risks No Fall Risks - - -  Follow up Falls evaluation completed Falls evaluation completed - - -    FALL RISK PREVENTION PERTAINING TO THE HOME:  Any stairs in or around the home? Yes  If so, are there any without handrails? No  Home free of loose throw rugs in walkways, pet beds,  electrical cords, etc? yes Adequate lighting in your home to reduce risk of falls? Yes   ASSISTIVE DEVICES UTILIZED TO PREVENT FALLS:  Life alert? No  Use of a cane, walker or w/c? Yes  Grab bars in the bathroom? Yes  Shower chair or bench in shower? Yes  Elevated toilet seat or a handicapped toilet? Yes   TIMED UP AND GO:  Was the test performed? No .  Length of time to ambulate n/a    Cognitive Function:     6CIT Screen 09/01/2020 02/06/2019 02/01/2018 09/29/2016  What Year? 0 points 0 points 0 points 0 points  What month? 0 points 0 points 0 points 0 points  What time? 0 points 0 points 0 points 0 points  Count back from 20 0 points 0 points 0 points 0 points  Months in reverse 0 points 0 points 0 points 0 points  Repeat phrase 0 points 2 points 0 points 0 points  Total Score 0 2 0 0    Immunizations Immunization History  Administered Date(s) Administered  . Fluad Quad(high Dose 65+) 05/13/2019  . Influenza Split 06/30/2011  . Influenza Whole 09/18/2007, 05/28/2009, 06/15/2010  . Influenza, High Dose Seasonal PF 06/27/2018  . Influenza,inj,Quad PF,6+ Mos 05/21/2013, 04/23/2014, 08/18/2015, 06/23/2016, 07/31/2017, 05/11/2020  . Influenza-Unspecified 04/15/2014  . Pneumococcal Conjugate-13 09/16/2014  . Pneumococcal Polysaccharide-23 05/24/2004, 12/20/2010  . Td 05/24/2004  . Tdap 06/30/2011    TDAP status: Up to date  Flu Vaccine status: Up to date  Pneumococcal vaccine status: Up to date  Covid-19 vaccine status: Declined, Education has been provided regarding the importance of this vaccine but patient still declined. Advised may receive this vaccine at local pharmacy or Health Dept.or vaccine clinic. Aware to provide a copy of the vaccination record if obtained from local pharmacy or Health Dept. Verbalized acceptance and understanding.  Qualifies for Shingles Vaccine? Yes   Zostavax completed Yes   Shingrix Completed?: Yes  Screening Tests Health Maintenance   Topic Date Due  . COVID-19 Vaccine (1) Never done  . COLONOSCOPY (Pts 45-91yr Insurance coverage will need to be confirmed)  Never done  . DEXA SCAN  Never done  . OPHTHALMOLOGY EXAM  05/31/2017  . URINE MICROALBUMIN  08/19/2020  . FOOT EXAM  08/21/2020  . HEMOGLOBIN A1C  11/04/2020  . TETANUS/TDAP  06/29/2021  . INFLUENZA VACCINE  Completed  . Hepatitis C Screening  Completed  . PNA vac Low Risk Adult  Completed    Health Maintenance  Health Maintenance Due  Topic Date Due  . COVID-19 Vaccine (1) Never done  . COLONOSCOPY (Pts 45-462yrInsurance coverage will need to be confirmed)  Never done  . DEXA SCAN  Never done  . OPHTHALMOLOGY EXAM  05/31/2017  . URINE MICROALBUMIN  08/19/2020  . FOOT EXAM  08/21/2020   Colorectal Screening: Declined at this time.  Mammogram: Declined at this time.  Bone Density: Declined at this time.  Lung Cancer Screening: (Low Dose CT Chest recommended if Age 76-80ears, 30 pack-year currently smoking OR have quit w/in 15years.) does not qualify.   Lung Cancer Screening Referral: n/a  Additional Screening:  Hepatitis C Screening: does not qualify  Vision Screening: Recommended annual ophthalmology exams for early detection of glaucoma and other disorders of the eye. Is the patient up to date with their annual eye exam?  No  Who is the provider or what is the name of the office in which the patient attends annual eye exams? n/a If pt is not established with a provider, would they like to be referred to a provider to establish care? Yes .   Dental Screening: Recommended annual dental exams for proper oral hygiene  Community Resource Referral / Chronic Care Management: CRR required this visit?  No   CCM required this visit?  No      Plan:      1. Encounter for Medicare annual wellness exam   I have personally reviewed and noted the following in the patient's chart:   . Medical and social history . Use of alcohol, tobacco or  illicit drugs  . Current medications and supplements . Functional ability and status . Nutritional status . Physical activity . Advanced directives . List of other physicians . Hospitalizations, surgeries, and ER visits in previous 12 months . Vitals . Screenings to include cognitive, depression, and falls . Referrals and appointments  In addition, I have reviewed and discussed with patient certain preventive protocols, quality metrics, and best practice recommendations. A written personalized care plan for preventive services as well as general preventive health recommendations were provided to patient.     Agreed with documentation    HaPerlie MayoNP   09/01/2020   Nurse Notes: AWV conducted by nurse off site by phone. Patient gave cosent to telehealth visit via audio. Patient at home. Provider off site. Visit took 30 minutes to complete.

## 2020-09-01 NOTE — Progress Notes (Signed)
a 

## 2020-09-01 NOTE — Patient Instructions (Addendum)
[name] , Thank you for taking time to come for your Medicare Wellness Visit. I appreciate your ongoing commitment to your health goals. Please review the following plan we discussed and let me know if I can assist you in the future.   Screening recommendations/referrals: Colonoscopy: Declined Mammogram: Declined Bone Density: Declined Recommended yearly ophthalmology/optometry visit for glaucoma screening and checkup Recommended yearly dental visit for hygiene and checkup  Vaccinations: Influenza vaccine: Fall 2022 Pneumococcal vaccine: Complete Tdap vaccine: 06/29/21 Shingles vaccine: Complete  Advanced directives: No  Conditions/risks identified: None  Next appointment: 09/09/20 @ 1 pm   Preventive Care 65 Years and Older, Female Preventive care refers to lifestyle choices and visits with your health care provider that can promote health and wellness. What does preventive care include?  A yearly physical exam. This is also called an annual well check.  Dental exams once or twice a year.  Routine eye exams. Ask your health care provider how often you should have your eyes checked.  Personal lifestyle choices, including:  Daily care of your teeth and gums.  Regular physical activity.  Eating a healthy diet.  Avoiding tobacco and drug use.  Limiting alcohol use.  Practicing safe sex.  Taking low-dose aspirin every day.  Taking vitamin and mineral supplements as recommended by your health care provider. What happens during an annual well check? The services and screenings done by your health care provider during your annual well check will depend on your age, overall health, lifestyle risk factors, and family history of disease. Counseling  Your health care provider may ask you questions about your:  Alcohol use.  Tobacco use.  Drug use.  Emotional well-being.  Home and relationship well-being.  Sexual activity.  Eating habits.  History of  falls.  Memory and ability to understand (cognition).  Work and work Statistician.  Reproductive health. Screening  You may have the following tests or measurements:  Height, weight, and BMI.  Blood pressure.  Lipid and cholesterol levels. These may be checked every 5 years, or more frequently if you are over 44 years old.  Skin check.  Lung cancer screening. You may have this screening every year starting at age 21 if you have a 30-pack-year history of smoking and currently smoke or have quit within the past 15 years.  Fecal occult blood test (FOBT) of the stool. You may have this test every year starting at age 6.  Flexible sigmoidoscopy or colonoscopy. You may have a sigmoidoscopy every 5 years or a colonoscopy every 10 years starting at age 4.  Hepatitis C blood test.  Hepatitis B blood test.  Sexually transmitted disease (STD) testing.  Diabetes screening. This is done by checking your blood sugar (glucose) after you have not eaten for a while (fasting). You may have this done every 1-3 years.  Bone density scan. This is done to screen for osteoporosis. You may have this done starting at age 34.  Mammogram. This may be done every 1-2 years. Talk to your health care provider about how often you should have regular mammograms. Talk with your health care provider about your test results, treatment options, and if necessary, the need for more tests. Vaccines  Your health care provider may recommend certain vaccines, such as:  Influenza vaccine. This is recommended every year.  Tetanus, diphtheria, and acellular pertussis (Tdap, Td) vaccine. You may need a Td booster every 10 years.  Zoster vaccine. You may need this after age 58.  Pneumococcal 13-valent conjugate (PCV13)  vaccine. One dose is recommended after age 86.  Pneumococcal polysaccharide (PPSV23) vaccine. One dose is recommended after age 63. Talk to your health care provider about which screenings and  vaccines you need and how often you need them. This information is not intended to replace advice given to you by your health care provider. Make sure you discuss any questions you have with your health care provider. Document Released: 08/28/2015 Document Revised: 04/20/2016 Document Reviewed: 06/02/2015 Elsevier Interactive Patient Education  2017 [place] Prevention in the Home Falls can cause injuries. They can happen to people of all ages. There are many things you can do to make your home safe and to help prevent falls. What can I do on the outside of my home?  Regularly fix the edges of walkways and driveways and fix any cracks.  Remove anything that might make you trip as you walk through a door, such as a raised step or threshold.  Trim any bushes or trees on the path to your home.  Use bright outdoor lighting.  Clear any walking paths of anything that might make someone trip, such as rocks or tools.  Regularly check to see if handrails are loose or broken. Make sure that both sides of any steps have handrails.  Any raised decks and porches should have guardrails on the edges.  Have any leaves, snow, or ice cleared regularly.  Use sand or salt on walking paths during winter.  Clean up any spills in your garage right away. This includes oil or grease spills. What can I do in the bathroom?  Use night lights.  Install grab bars by the toilet and in the tub and shower. Do not use towel bars as grab bars.  Use non-skid mats or decals in the tub or shower.  If you need to sit down in the shower, use a plastic, non-slip stool.  Keep the floor dry. Clean up any water that spills on the floor as soon as it happens.  Remove soap buildup in the tub or shower regularly.  Attach bath mats securely with double-sided non-slip rug tape.  Do not have throw rugs and other things on the floor that can make you trip. What can I do in the bedroom?  Use night  lights.  Make sure that you have a light by your bed that is easy to reach.  Do not use any sheets or blankets that are too big for your bed. They should not hang down onto the floor.  Have a firm chair that has side arms. You can use this for support while you get dressed.  Do not have throw rugs and other things on the floor that can make you trip. What can I do in the kitchen?  Clean up any spills right away.  Avoid walking on wet floors.  Keep items that you use a lot in easy-to-reach places.  If you need to reach something above you, use a strong step stool that has a grab bar.  Keep electrical cords out of the way.  Do not use floor polish or wax that makes floors slippery. If you must use wax, use non-skid floor wax.  Do not have throw rugs and other things on the floor that can make you trip. What can I do with my stairs?  Do not leave any items on the stairs.  Make sure that there are handrails on both sides of the stairs and use them. Fix handrails that are  broken or loose. Make sure that handrails are as long as the stairways.  Check any carpeting to make sure that it is firmly attached to the stairs. Fix any carpet that is loose or worn.  Avoid having throw rugs at the top or bottom of the stairs. If you do have throw rugs, attach them to the floor with carpet tape.  Make sure that you have a light switch at the top of the stairs and the bottom of the stairs. If you do not have them, ask someone to add them for you. What else can I do to help prevent falls?  Wear shoes that:  Do not have high heels.  Have rubber bottoms.  Are comfortable and fit you well.  Are closed at the toe. Do not wear sandals.  If you use a stepladder:  Make sure that it is fully opened. Do not climb a closed stepladder.  Make sure that both sides of the stepladder are locked into place.  Ask someone to hold it for you, if possible.  Clearly mark and make sure that you can  see:  Any grab bars or handrails.  First and last steps.  Where the edge of each step is.  Use tools that help you move around (mobility aids) if they are needed. These include:  Canes.  Walkers.  Scooters.  Crutches.  Turn on the lights when you go into a dark area. Replace any light bulbs as soon as they burn out.  Set up your furniture so you have a clear path. Avoid moving your furniture around.  If any of your floors are uneven, fix them.  If there are any pets around you, be aware of where they are.  Review your medicines with your doctor. Some medicines can make you feel dizzy. This can increase your chance of falling. Ask your doctor what other things that you can do to help prevent falls. This information is not intended to replace advice given to you by your health care provider. Make sure you discuss any questions you have with your health care provider. Document Released: 05/28/2009 Document Revised: 01/07/2016 Document Reviewed: 09/05/2014 Elsevier Interactive Patient Education  2017 [employer].

## 2020-09-09 ENCOUNTER — Ambulatory Visit: Payer: PPO | Admitting: Family Medicine

## 2020-09-09 DIAGNOSIS — U071 COVID-19: Secondary | ICD-10-CM | POA: Diagnosis not present

## 2020-09-17 ENCOUNTER — Other Ambulatory Visit: Payer: Self-pay | Admitting: Family Medicine

## 2020-09-25 ENCOUNTER — Other Ambulatory Visit: Payer: Self-pay | Admitting: Family Medicine

## 2020-09-28 ENCOUNTER — Other Ambulatory Visit: Payer: Self-pay

## 2020-09-28 MED ORDER — POTASSIUM CHLORIDE ER 10 MEQ PO TBCR: EXTENDED_RELEASE_TABLET | ORAL | 6 refills | Status: DC

## 2020-10-05 ENCOUNTER — Other Ambulatory Visit: Payer: Self-pay | Admitting: Family Medicine

## 2020-10-13 ENCOUNTER — Other Ambulatory Visit: Payer: Self-pay | Admitting: Family Medicine

## 2020-10-15 ENCOUNTER — Ambulatory Visit: Payer: PPO | Admitting: Family Medicine

## 2020-10-15 ENCOUNTER — Other Ambulatory Visit: Payer: Self-pay | Admitting: Family Medicine

## 2020-10-21 ENCOUNTER — Encounter: Payer: Self-pay | Admitting: Internal Medicine

## 2020-10-21 ENCOUNTER — Other Ambulatory Visit: Payer: Self-pay

## 2020-10-21 ENCOUNTER — Ambulatory Visit (INDEPENDENT_AMBULATORY_CARE_PROVIDER_SITE_OTHER): Payer: PPO | Admitting: Internal Medicine

## 2020-10-21 VITALS — BP 130/70 | HR 80 | Resp 18 | Ht 66.0 in | Wt 177.1 lb

## 2020-10-21 DIAGNOSIS — I10 Essential (primary) hypertension: Secondary | ICD-10-CM | POA: Diagnosis not present

## 2020-10-21 DIAGNOSIS — M71329 Other bursal cyst, unspecified elbow: Secondary | ICD-10-CM | POA: Diagnosis not present

## 2020-10-21 DIAGNOSIS — I5042 Chronic combined systolic (congestive) and diastolic (congestive) heart failure: Secondary | ICD-10-CM

## 2020-10-21 DIAGNOSIS — Z0100 Encounter for examination of eyes and vision without abnormal findings: Secondary | ICD-10-CM | POA: Diagnosis not present

## 2020-10-21 DIAGNOSIS — I350 Nonrheumatic aortic (valve) stenosis: Secondary | ICD-10-CM | POA: Diagnosis not present

## 2020-10-21 DIAGNOSIS — Z780 Asymptomatic menopausal state: Secondary | ICD-10-CM | POA: Diagnosis not present

## 2020-10-21 DIAGNOSIS — Z0000 Encounter for general adult medical examination without abnormal findings: Secondary | ICD-10-CM

## 2020-10-21 DIAGNOSIS — E119 Type 2 diabetes mellitus without complications: Secondary | ICD-10-CM | POA: Diagnosis not present

## 2020-10-21 NOTE — Assessment & Plan Note (Signed)
BP Readings from Last 1 Encounters:  10/21/20 130/70   Well-controlled Counseled for compliance with the medications Advised DASH diet and moderate exercise/walking, at least 150 mins/week

## 2020-10-21 NOTE — Assessment & Plan Note (Addendum)
S/p aortic valve replacement No signs of volume overload Continue Lasix for now

## 2020-10-21 NOTE — Assessment & Plan Note (Signed)
No overt signs of volume overload Continue Coreg, Lasix and Spironolactone S/p AICD placement

## 2020-10-21 NOTE — Assessment & Plan Note (Signed)
Annual exam as documented. Counseling done  re healthy lifestyle involving commitment to 150 minutes exercise per week, heart healthy diet, and attaining healthy weight.The importance of adequate sleep also discussed. Changes in health habits are decided on by the patient with goals and time frames  set for achieving them. Immunization and cancer screening needs are specifically addressed at this visit. 

## 2020-10-21 NOTE — Patient Instructions (Signed)
Please continue taking medications as prescribed.  Please apply heating pad over elbow to help with swelling. Okay to take Tylenol for pain. Please contact Orthopedic surgeon - Dr Aline Brochure if swelling/pain persists or worsens.  Please continue to follow low salt diet.  You are being referred to Ophthalmologist.  You are being scheduled for DEXA scan.  Please get fasting blood tests done within a week.

## 2020-10-21 NOTE — Progress Notes (Signed)
Established Patient Office Visit  Subjective:  Patient ID: [name] [name], female    DOB: 10-15-1945  Age: 75 y.o. MRN: [id number]  CC:  Chief Complaint  Patient presents with  . 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  She has been doing well overall.  BP is well-controlled. Takes medications regularly. Patient denies headache, dizziness, chest pain, dyspnea or palpitations. She has been following up with Cardiologist for HFrEF. She denies any LE swelling.  She c/o b/l knee pain and left elbow pain. She has noticed swelling over her left elbow. She denies any warmth or redness. Denies any recent injury. She used to see Dr Aline Brochure for knee pain and get intraarticular steroids.  She has not had DEXA scan yet for osteoporosis.  Past Medical History:  Diagnosis Date  . Acute hypoxemic respiratory failure due to COVID-19 ([place]) 06/29/2020  . Anxiety   . Aortic stenosis   . Arthritis   . Biventricular ICD (implantable cardioverter-defibrillator) in place    March 2021, [employer]  -[name]  . Chronic combined systolic and diastolic CHF (congestive heart failure) ([place])   . CKD (chronic kidney disease) stage 3, GFR 30-59 ml/min (HCC)   . Constipation   . COVID-19 virus infection 07/16/2020  . Depression   . Diabetes mellitus, type 2 ([place])   . Essential hypertension   . Hyperlipidemia   . Incidental pulmonary nodule, > 87m and < 834m   7 x 4 mm LLL nodule  . LBBB (left bundle branch block)   . Nonischemic cardiomyopathy (HCC)    No significant obstructive CAD at heart catheterization 05/2014, LVEF 20%  . Obesity   . Pneumonia 2013  . S/P TAVR (transcatheter aortic valve replacement)  03/24/2015   23 mm Edwards Sapien 3 transcatheter heart valve placed via open right transfemoral approach  . Symptomatic PVCs     Past Surgical History:  Procedure Laterality Date  . BIV ICD INSERTION CRT-D N/A 10/28/2019   Procedure: BIV ICD INSERTION CRT-D;  Surgeon: TaEvans LanceMD;  [facility];  Service: Cardiovascular;  Laterality: N/A;  . CARDIAC CATHETERIZATION    . CESAREAN SECTION  1987  . FLEXIBLE BRONCHOSCOPY N/A 01/14/2015   Procedure: FLEXIBLE BRONCHOSCOPY;  Surgeon: StMelrose NakayamaMD;  [facility]: Thor[facility];  Laterality: N/A;  . LEFT AND RIGHT HEART CATHETERIZATION WITH CORONARY ANGIOGRAM N/A 12/05/2011   Procedure: LEFT AND RIGHT HEART CATHETERIZATION WITH CORONARY ANGIOGRAM;  Surgeon: ChBurnell BlanksMD;  [facility];  Service: Cardiovascular;  Laterality: N/A;  . LEFT AND RIGHT HEART CATHETERIZATION WITH CORONARY ANGIOGRAM N/A 05/23/2014   Procedure: LEFT AND RIGHT HEART CATHETERIZATION WITH CORONARY ANGIOGRAM;  Surgeon: ChBurnell BlanksMD;  [facility];  Service: Cardiovascular;  Laterality: N/A;  . MULTIPLE EXTRACTIONS WITH ALVEOLOPLASTY N/A 02/27/2014   Procedure: Extraction of tooth #'s 2,4,5,6,7,8,9,10,11,12, 22, 23, 24, 25, 26, 28 with alveoloplasty and bilateral mandibular tori reductions;  Surgeon: RoLenn CalDDS;  [facility] Service: Oral Surgery;  Laterality: N/A;  . RIGID BRONCHOSCOPY N/A 01/14/2015   Procedure: RIGID BRONCHOSCOPY with Removal Of Foreign Body ;  Surgeon: [name], MD;  Location: [facility];  Service: Thoracic;  Laterality: N/A;  . TEE WITHOUT CARDIOVERSION N/A 03/24/2015   Procedure: TRANSESOPHAGEAL ECHOCARDIOGRAM (TEE);  Surgeon: [name], MD;  Location: [facility];  Service: Open Heart Surgery;  Laterality: N/A;  . TRANSCATHETER AORTIC VALVE REPLACEMENT, TRANSFEMORAL Bilateral 03/24/2015   Procedure: TRANSCATHETER AORTIC VALVE REPLACEMENT,  TRANSFEMORAL;  Surgeon: [name], MD;  Location: [facility] [facility];  Service: Open Heart Surgery;  Laterality: Bilateral;  . TUBAL LIGATION  1987  . VIDEO BRONCHOSCOPY Bilateral 06/05/2014   Procedure: VIDEO BRONCHOSCOPY WITHOUT FLUORO;  Surgeon: [name], MD;  Location[facility] ENDOSCOPY;  Service: Cardiopulmonary;  Laterality: Bilateral;    Family History  Problem Relation Age of Onset  . Heart disease Mother   . Heart attack Mother 13  . Diabetes Mother   . Hypertension Mother   . Colon cancer Father 94  . Breast cancer Sister   . Diabetes Brother   . Hypertension Brother   . Stroke Brother   . Cancer Brother   . Congestive Heart Failure Daughter   . Hypertension Son   . High Cholesterol Son   . Congestive Heart Failure Son     Social History   Socioeconomic History  . Marital status: Widowed    Spouse name: Not on file  . Number of children: 5  . Years of education: Not on file  . Highest education level: Not on file  Occupational History  . Occupation: Retired  Tobacco Use  . Smoking status: Never Smoker  . Smokeless tobacco: Never Used  Vaping Use  . Vaping Use: Never used  Substance and Sexual Activity  . Alcohol use: No  . Drug use: No  . Sexual activity: Never    Birth control/protection: None, Post-menopausal  Other Topics Concern  . Not on file  Social History Narrative  . Not on file   Social Determinants of Health   Financial Resource Strain: Low Risk   . Difficulty of Paying Living Expenses: Not hard at all  Food Insecurity: No Food Insecurity  . Worried About Charity fundraiser in the Last Year: Never true  . Ran Out of Food in the Last Year: Never true  Transportation Needs: No Transportation Needs  . Lack of Transportation (Medical): No  . Lack of Transportation (Non-Medical): No  Physical Activity: Insufficiently Active  . Days of Exercise per Week: 7 days  . Minutes of Exercise per Session: 20 min  Stress: No Stress Concern  Present  . Feeling of Stress : Not at all  Social Connections: Moderately Isolated  . Frequency of Communication with Friends and Family: More than three times a week  . Frequency of Social Gatherings with Friends and Family: More than three times a week  . Attends Religious Services: More than 4 times per year  . Active Member of Clubs or Organizations: No  . Attends Archivist Meetings: Never  . Marital Status: Widowed  Intimate Partner Violence: Not At Risk  . Fear of Current or Ex-Partner: No  . Emotionally Abused: No  . Physically Abused: No  . Sexually Abused: No    Outpatient Medications Prior to Visit  Medication Sig Dispense Refill  . acetaminophen (TYLENOL) 500 MG tablet Take one tablet two times daily, by mouth, for knee  pain (Patient taking differently: Take 500 mg by mouth 2 (two) times daily as needed for moderate pain. Take one tablet two times daily, by mouth, for  knee  pain) 60 tablet 3  . aspirin EC 81 MG tablet Take 81 mg by mouth every morning.     . blood glucose meter kit and supplies Dispense based on patient and insurance preference. Use to test three times daily (FOR ICD-10 E10.9, E11.9). 1 each 0  . carvedilol (COREG) 25 MG tablet Take 25 mg by mouth 2 (two) times daily.    . diclofenac Sodium (VOLTAREN) 1 % GEL Apply 1 application topically 4 (four) times daily as needed (pain).    Marland Kitchen ezetimibe (ZETIA) 10 MG tablet TAKE ONE TABLET BY MOUTH ONCE DAILY. 90 tablet 0  . fenofibrate (TRICOR) 145 MG tablet TAKE (1) TABLET BY MOUTH ONCE DAILY. 30 tablet 0  . furosemide (LASIX) 40 MG tablet TAKE (1) TABLET BY MOUTH EACH MORNING. 30 tablet 0  . glipiZIDE (GLUCOTROL XL) 2.5 MG 24 hr tablet TAKE ONE TABLET BY MOUTH ONCE DAILY WITH BREAKFAST. 30 tablet 0  . Lancets (ONETOUCH DELICA PLUS XFGHWE99B) MISC USE 3 TIMES A DAY AS DIRECTED. 100 each 0  . LITETOUCH PEN NEEDLES 31G X 8 MM MISC USE AS NEEDED TO INJECT INSULIN. 100 each 0  . LIVALO 2 MG TABS TAKE ONE TABLET  BY MOUTH ONCE DAILY AFTER SUPPER. 90 tablet 0  . Multiple Vitamins-Minerals (MULTIVITAMIN PO) Take 1 tablet by mouth every morning.     Marland Kitchen NOVOLOG MIX 70/30 FLEXPEN (70-30) 100 UNIT/ML FlexPen INJECT 12 TO 15 UNITS SUBCUTANEOUSLY TWICE DAILY. 15 mL 0  . ONETOUCH ULTRA test strip CHECK BLOOD SUGAR 3 TIMES A DAY. 100 strip 0  . potassium chloride (KLOR-CON) 10 MEQ tablet Once daily 30 tablet 6  . spironolactone (ALDACTONE) 25 MG tablet TAKE ONE TABLET BY MOUTH ONCE DAILY. 90 tablet 0  . UNABLE TO FIND Shower bench x 1   DX M17.12 1 each 0  . UNABLE TO FIND Rolling walker with seat x 1  DX m17.12 1 each 0  . cetirizine (ZYRTEC) 10 MG tablet Take 1 tablet (10 mg total) by mouth daily. 30 tablet 1  . colchicine 0.6 MG tablet Take 1 tablet (0.6 mg total) by mouth 2 (two) times daily. Take with food (Patient not taking: Reported on 10/21/2020) 60 tablet 5   No facility-administered medications prior to visit.    Allergies  Allergen Reactions  . Crestor [Rosuvastatin] Other (See Comments)    Muscle aches and elevated CK  . Tramadol Nausea Only    States blood sugar elevated also  . Penicillins Rash and Other (See Comments)    Family unsure of reaction, but certain mom said she was allergic Did it involve swelling of the face/tongue/throat, SOB, or low BP? No Did it involve sudden or severe rash/hives, skin peeling, or any reaction on the inside of your mouth or nose? Yes Did you need to seek medical attention at a hospital or doctor's office? Yes When did it last happen?10 + years If all above answers are "NO", may proceed with cephalosporin use.     ROS Review of Systems  Constitutional: Negative for chills, fatigue and fever.  HENT: Negative for congestion, sinus pressure, sinus pain and sore throat.   Eyes: Negative for pain and discharge.  Respiratory: Negative for cough and shortness of breath.   Cardiovascular: Negative for chest pain and palpitations.  Gastrointestinal:  Negative for abdominal pain, constipation, diarrhea, nausea and vomiting.  Endocrine: Negative for polydipsia and polyuria.  Genitourinary: Negative for dysuria and hematuria.  Musculoskeletal: Positive for arthralgias, gait  problem and joint swelling (left elbow). Negative for neck pain and neck stiffness.  Skin: Negative for rash.  Neurological: Negative for dizziness, weakness, numbness and headaches.  Psychiatric/Behavioral: Positive for sleep disturbance. Negative for agitation and behavioral problems. The patient is not nervous/anxious.       Objective:    Physical Exam Vitals reviewed.  Constitutional:      General: She is not in acute distress.    Appearance: She is not diaphoretic.  HENT:     Head: Normocephalic and atraumatic.     Nose: Nose normal. No congestion.     Mouth/Throat:     Mouth: Mucous membranes are moist.     Pharynx: No posterior oropharyngeal erythema.  Eyes:     General: No scleral icterus.    Extraocular Movements: Extraocular movements intact.     Pupils: Pupils are equal, round, and reactive to light.  Cardiovascular:     Rate and Rhythm: Normal rate and regular rhythm.     Pulses: Normal pulses.     Heart sounds: Murmur (systolic, over upper sternal border) heard.    Pulmonary:     Breath sounds: Normal breath sounds. No wheezing or rales.  Abdominal:     Palpations: Abdomen is soft.     Tenderness: There is no abdominal tenderness.  Musculoskeletal:     Right elbow: Normal.     Left elbow: Swelling (fluid filled sac) present.     Cervical back: Neck supple. No tenderness.     Right knee: Swelling present. Tenderness present.     Left knee: Swelling present. Tenderness present.     Right lower leg: No edema.     Left lower leg: No edema.  Skin:    General: Skin is warm.     Findings: No rash.  Neurological:     General: No focal deficit present.     Mental Status: She is alert and oriented to person, place, and time.  Psychiatric:         Mood and Affect: Mood normal.        Behavior: Behavior normal.     BP 130/70 (BP Location: Right Arm, Patient Position: Sitting, Cuff Size: Normal)   Pulse 80   Resp 18   Ht _0  (1.676 m)   Wt 177 lb 1.9 oz (80.3 kg)   SpO2 98%   BMI 28.59 kg/m  Wt Readings from Last 3 Encounters:  10/21/20 177 lb 1.9 oz (80.3 kg)  09/01/20 175 lb (79.4 kg)  08/25/20 175 lb 9.6 oz (79.7 kg)     Health Maintenance Due  Topic Date Due  . COLONOSCOPY (Pts 45-72yr Insurance coverage will need to be confirmed)  Never done  . DEXA SCAN  Never done  . OPHTHALMOLOGY EXAM  05/31/2017  . URINE MICROALBUMIN  08/19/2020  . FOOT EXAM  08/21/2020    There are no preventive care reminders to display for this patient.  Lab Results  Component Value Date   TSH 1.95 02/13/2018   Lab Results  Component Value Date   WBC 5.4 07/20/2020   HGB 11.2 07/20/2020   HCT 34.4 07/20/2020   MCV 86 07/20/2020   PLT 184 07/20/2020   Lab Results  Component Value Date   NA 147 (H) 07/20/2020   K 4.1 07/20/2020   CO2 23 07/20/2020   GLUCOSE 173 (H) 07/20/2020   BUN 24 07/20/2020   CREATININE 1.43 (H) 07/20/2020   BILITOT 0.4 07/20/2020   ALKPHOS 48 07/20/2020  AST 31 07/20/2020   ALT 25 07/20/2020   PROT 5.6 (L) 07/20/2020   ALBUMIN 3.0 (L) 07/20/2020   CALCIUM 8.8 07/20/2020   ANIONGAP 12 07/04/2020   Lab Results  Component Value Date   CHOL 168 08/20/2019   Lab Results  Component Value Date   HDL 58 08/20/2019   Lab Results  Component Value Date   LDLCALC 94 08/20/2019   Lab Results  Component Value Date   TRIG 209 (H) 06/29/2020   Lab Results  Component Value Date   CHOLHDL 2.9 08/20/2019   Lab Results  Component Value Date   HGBA1C 7.4 (H) 05/07/2020      Assessment & Plan:   Problem List Items Addressed This Visit      Annual physical exam - Primary   Annual exam as documented. Counseling done  re healthy lifestyle involving commitment to 150 minutes exercise per  week, heart healthy diet, and attaining healthy weight.The importance of adequate sleep also discussed. Changes in health habits are decided on by the patient with goals and time frames  set for achieving them. Immunization and cancer screening needs are specifically addressed at this visit.     Relevant Orders  CBC  CMP14+EGFR  Hemoglobin A1c  Lipid panel  TSH  Vitamin D (25 hydroxy)    Cardiovascular and Mediastinum   Benign essential hypertension    BP Readings from Last 1 Encounters:  10/21/20 130/70   Well-controlled Counseled for compliance with the medications Advised DASH diet and moderate exercise/walking, at least 150 mins/week       Relevant Orders   CMP14+EGFR   Hemoglobin A1c   Lipid panel   TSH   Chronic combined systolic and diastolic heart failure (HCC)    No overt signs of volume overload Continue Coreg, Lasix and Spironolactone S/p AICD placement      Relevant Orders   CMP14+EGFR   Lipid panel   Severe aortic stenosis    S/p aortic valve replacement No signs of volume overload Continue Lasix for now        Other    Other Visit Diagnoses    Synovial cyst of elbow     Elbow swelling appears to be cyst Advised to apply heating pad Tylenol PRN Advised to contact Orthopedic surgery if persistent or worsening of symptoms   Diabetic eye exam ([place])       Relevant Orders   Ambulatory referral to Ophthalmology   Post-menopausal       Relevant Orders   DG Bone Density   Vitamin D (25 hydroxy)      No orders of the defined types were placed in this encounter.   Follow-up: Return in about 4 months (around 02/20/2021).    Lindell Spar, MD

## 2020-10-23 ENCOUNTER — Other Ambulatory Visit: Payer: Self-pay | Admitting: Family Medicine

## 2020-10-26 ENCOUNTER — Ambulatory Visit (INDEPENDENT_AMBULATORY_CARE_PROVIDER_SITE_OTHER): Payer: PPO

## 2020-10-26 DIAGNOSIS — Z95810 Presence of automatic (implantable) cardiac defibrillator: Secondary | ICD-10-CM

## 2020-10-27 DIAGNOSIS — I10 Essential (primary) hypertension: Secondary | ICD-10-CM | POA: Diagnosis not present

## 2020-10-27 DIAGNOSIS — Z780 Asymptomatic menopausal state: Secondary | ICD-10-CM | POA: Diagnosis not present

## 2020-10-27 DIAGNOSIS — I5042 Chronic combined systolic (congestive) and diastolic (congestive) heart failure: Secondary | ICD-10-CM | POA: Diagnosis not present

## 2020-10-27 DIAGNOSIS — Z0000 Encounter for general adult medical examination without abnormal findings: Secondary | ICD-10-CM | POA: Diagnosis not present

## 2020-10-28 ENCOUNTER — Other Ambulatory Visit (HOSPITAL_COMMUNITY): Payer: PPO

## 2020-10-28 LAB — CMP14+EGFR
ALT: 15 IU/L (ref 0–32)
AST: 22 IU/L (ref 0–40)
Albumin/Globulin Ratio: 1.7 (ref 1.2–2.2)
Albumin: 4.3 g/dL (ref 3.7–4.7)
Alkaline Phosphatase: 52 IU/L (ref 44–121)
BUN/Creatinine Ratio: 19 (ref 12–28)
BUN: 35 mg/dL — ABNORMAL HIGH (ref 8–27)
Bilirubin Total: 0.3 mg/dL (ref 0.0–1.2)
CO2: 22 mmol/L (ref 20–29)
Calcium: 10.1 mg/dL (ref 8.7–10.3)
Chloride: 100 mmol/L (ref 96–106)
Creatinine, Ser: 1.8 mg/dL — ABNORMAL HIGH (ref 0.57–1.00)
Globulin, Total: 2.6 g/dL (ref 1.5–4.5)
Glucose: 156 mg/dL — ABNORMAL HIGH (ref 65–99)
Potassium: 4.4 mmol/L (ref 3.5–5.2)
Sodium: 140 mmol/L (ref 134–144)
Total Protein: 6.9 g/dL (ref 6.0–8.5)
eGFR: 29 mL/min/{1.73_m2} — ABNORMAL LOW (ref >59)

## 2020-10-28 LAB — LIPID PANEL
Chol/HDL Ratio: 3.8 ratio (ref 0.0–4.4)
Cholesterol, Total: 171 mg/dL (ref 100–199)
HDL: 45 mg/dL (ref >39)
LDL Chol Calc (NIH): 107 mg/dL — ABNORMAL HIGH (ref 0–99)
Triglycerides: 106 mg/dL (ref 0–149)
VLDL Cholesterol Cal: 19 mg/dL (ref 5–40)

## 2020-10-28 LAB — CUP PACEART REMOTE DEVICE CHECK
Battery Remaining Longevity: 100 mo
Battery Voltage: 2.99 V
Brady Statistic AP VP Percent: 13.86 %
Brady Statistic AP VS Percent: 0.51 %
Brady Statistic AS VP Percent: 83.75 %
Brady Statistic AS VS Percent: 1.88 %
Brady Statistic RA Percent Paced: 14.05 %
Brady Statistic RV Percent Paced: 1.67 %
Date Time Interrogation Session: 20220314001704
HighPow Impedance: 76 Ohm
Implantable Lead Implant Date: 20210315
Implantable Lead Implant Date: 20210315
Implantable Lead Implant Date: 20210315
Implantable Lead Location: 753858
Implantable Lead Location: 753859
Implantable Lead Location: 753860
Implantable Lead Model: 4598
Implantable Lead Model: 5076
Implantable Lead Model: 6935
Implantable Pulse Generator Implant Date: 20210315
Lead Channel Impedance Value: 198.837
Lead Channel Impedance Value: 198.837
Lead Channel Impedance Value: 211.021
Lead Channel Impedance Value: 255.093
Lead Channel Impedance Value: 255.093
Lead Channel Impedance Value: 342 Ohm
Lead Channel Impedance Value: 361 Ohm
Lead Channel Impedance Value: 456 Ohm
Lead Channel Impedance Value: 456 Ohm
Lead Channel Impedance Value: 475 Ohm
Lead Channel Impedance Value: 475 Ohm
Lead Channel Impedance Value: 551 Ohm
Lead Channel Impedance Value: 589 Ohm
Lead Channel Impedance Value: 703 Ohm
Lead Channel Impedance Value: 703 Ohm
Lead Channel Impedance Value: 817 Ohm
Lead Channel Impedance Value: 874 Ohm
Lead Channel Impedance Value: 931 Ohm
Lead Channel Pacing Threshold Amplitude: 0.5 V
Lead Channel Pacing Threshold Amplitude: 0.625 V
Lead Channel Pacing Threshold Amplitude: 0.875 V
Lead Channel Pacing Threshold Pulse Width: 0.4 ms
Lead Channel Pacing Threshold Pulse Width: 0.4 ms
Lead Channel Pacing Threshold Pulse Width: 0.4 ms
Lead Channel Sensing Intrinsic Amplitude: 2.875 mV
Lead Channel Sensing Intrinsic Amplitude: 2.875 mV
Lead Channel Sensing Intrinsic Amplitude: 23.75 mV
Lead Channel Sensing Intrinsic Amplitude: 23.75 mV
Lead Channel Setting Pacing Amplitude: 1.75 V
Lead Channel Setting Pacing Amplitude: 2 V
Lead Channel Setting Pacing Amplitude: 2.25 V
Lead Channel Setting Pacing Pulse Width: 0.4 ms
Lead Channel Setting Pacing Pulse Width: 0.4 ms
Lead Channel Setting Sensing Sensitivity: 0.3 mV

## 2020-10-28 LAB — VITAMIN D 25 HYDROXY (VIT D DEFICIENCY, FRACTURES): Vit D, 25-Hydroxy: 37.6 ng/mL (ref 30.0–100.0)

## 2020-10-28 LAB — CBC
Hematocrit: 40.6 % (ref 34.0–46.6)
Hemoglobin: 13.1 g/dL (ref 11.1–15.9)
MCH: 27.5 pg (ref 26.6–33.0)
MCHC: 32.3 g/dL (ref 31.5–35.7)
MCV: 85 fL (ref 79–97)
Platelets: 190 10*3/uL (ref 150–450)
RBC: 4.76 x10E6/uL (ref 3.77–5.28)
RDW: 13.2 % (ref 11.7–15.4)
WBC: 4.7 10*3/uL (ref 3.4–10.8)

## 2020-10-28 LAB — HEMOGLOBIN A1C
Est. average glucose Bld gHb Est-mCnc: 171 mg/dL
Hgb A1c MFr Bld: 7.6 % — ABNORMAL HIGH (ref 4.8–5.6)

## 2020-10-28 LAB — TSH: TSH: 3.21 u[IU]/mL (ref 0.450–4.500)

## 2020-10-30 ENCOUNTER — Other Ambulatory Visit: Payer: Self-pay | Admitting: Family Medicine

## 2020-11-04 ENCOUNTER — Other Ambulatory Visit: Payer: Self-pay | Admitting: Family Medicine

## 2020-11-04 ENCOUNTER — Other Ambulatory Visit: Payer: Self-pay

## 2020-11-04 ENCOUNTER — Telehealth: Payer: Self-pay

## 2020-11-04 DIAGNOSIS — N184 Chronic kidney disease, stage 4 (severe): Secondary | ICD-10-CM

## 2020-11-04 DIAGNOSIS — E1122 Type 2 diabetes mellitus with diabetic chronic kidney disease: Secondary | ICD-10-CM | POA: Diagnosis not present

## 2020-11-04 DIAGNOSIS — E559 Vitamin D deficiency, unspecified: Secondary | ICD-10-CM | POA: Diagnosis not present

## 2020-11-04 DIAGNOSIS — E611 Iron deficiency: Secondary | ICD-10-CM | POA: Diagnosis not present

## 2020-11-04 DIAGNOSIS — I5042 Chronic combined systolic (congestive) and diastolic (congestive) heart failure: Secondary | ICD-10-CM | POA: Diagnosis not present

## 2020-11-04 DIAGNOSIS — I129 Hypertensive chronic kidney disease with stage 1 through stage 4 chronic kidney disease, or unspecified chronic kidney disease: Secondary | ICD-10-CM | POA: Diagnosis not present

## 2020-11-04 DIAGNOSIS — Z5181 Encounter for therapeutic drug level monitoring: Secondary | ICD-10-CM | POA: Diagnosis not present

## 2020-11-04 DIAGNOSIS — N189 Chronic kidney disease, unspecified: Secondary | ICD-10-CM | POA: Diagnosis not present

## 2020-11-04 MED ORDER — BLOOD GLUCOSE METER KIT: PACK | 0 refills | Status: DC

## 2020-11-04 NOTE — Progress Notes (Signed)
Remote ICD transmission.   

## 2020-11-04 NOTE — Telephone Encounter (Signed)
Patient called she states her blood sugar meter is acting weird and its older she is requesting a new meter ph# [phone]

## 2020-11-04 NOTE — Telephone Encounter (Signed)
Faxed to [facility]

## 2020-11-16 ENCOUNTER — Other Ambulatory Visit (HOSPITAL_COMMUNITY): Payer: PPO

## 2020-11-17 ENCOUNTER — Other Ambulatory Visit: Payer: Self-pay | Admitting: Family Medicine

## 2020-11-23 ENCOUNTER — Ambulatory Visit: Payer: PPO

## 2020-11-23 ENCOUNTER — Ambulatory Visit: Payer: PPO | Admitting: Orthopedic Surgery

## 2020-11-23 ENCOUNTER — Other Ambulatory Visit: Payer: Self-pay

## 2020-11-23 VITALS — BP 124/84 | HR 65 | Ht 66.0 in | Wt 176.0 lb

## 2020-11-23 DIAGNOSIS — G8929 Other chronic pain: Secondary | ICD-10-CM

## 2020-11-23 DIAGNOSIS — M25561 Pain in right knee: Secondary | ICD-10-CM

## 2020-11-23 DIAGNOSIS — M1712 Unilateral primary osteoarthritis, left knee: Secondary | ICD-10-CM | POA: Diagnosis not present

## 2020-11-23 NOTE — Progress Notes (Addendum)
Chief Complaint  Patient presents with  . Knee Pain    Bilateral     This is a 75 year old female with bilateral knee pain left worse than right but she says the pain moves around.  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  She does have some pain motion but no instability  X-ray today shows osteoarthritis of the left knee but her cartilage spaces seem to be only moderately compromised.  She has some chondrocalcinosis.  We decided because of her medical history to continue with injection therapy follow-up in 6 months  Procedure note for bilateral knee injections  Procedure note left knee injection verbal consent was obtained to inject left knee joint  Timeout was completed to confirm the site of injection  The medications used were 6 mg Celestone with Sensorcaine Anesthesia was provided by ethyl chloride and the skin was prepped with alcohol.  After cleaning the skin with alcohol a 20-gauge needle was used to inject the left knee joint. There were no complications. A sterile bandage was applied.   Procedure note right knee injection verbal consent was obtained to inject right knee joint  Timeout was completed to confirm the site of injection  The medications used were 6 mg Celestone with Sensorcaine Anesthesia was provided by ethyl chloride and the skin was prepped with alcohol.  After cleaning the skin with alcohol a 20-gauge needle was used to inject the right knee joint. There were no complications. A sterile bandage was applied.  Encounter Diagnoses  Name  Primary?  . Chronic pain of left knee Yes  . Chronic pain of right knee

## 2020-12-02 ENCOUNTER — Other Ambulatory Visit: Payer: Self-pay

## 2020-12-02 ENCOUNTER — Telehealth: Payer: Self-pay

## 2020-12-02 DIAGNOSIS — E1122 Type 2 diabetes mellitus with diabetic chronic kidney disease: Secondary | ICD-10-CM

## 2020-12-02 DIAGNOSIS — N184 Chronic kidney disease, stage 4 (severe): Secondary | ICD-10-CM

## 2020-12-02 MED ORDER — BLOOD GLUCOSE METER KIT: PACK | 0 refills | Status: DC

## 2020-12-02 NOTE — Telephone Encounter (Signed)
Rx reordered from med list.

## 2020-12-02 NOTE — Telephone Encounter (Signed)
Diabetic lancets strips, and machine

## 2020-12-03 ENCOUNTER — Other Ambulatory Visit: Payer: Self-pay | Admitting: Family Medicine

## 2020-12-04 ENCOUNTER — Other Ambulatory Visit: Payer: Self-pay | Admitting: Cardiology

## 2020-12-07 ENCOUNTER — Telehealth: Payer: Self-pay

## 2020-12-07 NOTE — Telephone Encounter (Signed)
Patient requesting a refill on pen needles she states the pharmacy will not let her get them that she is to early and will have to wait until 12/15/20 but she will run out Wednesday morning of this week she does 15 units in the am and 12 in the pm she will only have enough for Wednesday am pharmacy is [facility] ph# [phone]

## 2020-12-08 ENCOUNTER — Other Ambulatory Visit: Payer: Self-pay

## 2020-12-08 ENCOUNTER — Encounter: Payer: Self-pay | Admitting: Emergency Medicine

## 2020-12-08 ENCOUNTER — Ambulatory Visit
Admission: EM | Admit: 2020-12-08 | Discharge: 2020-12-08 | Disposition: A | Discharge status: Home / Self Care | Payer: PPO | Attending: Emergency Medicine | Admitting: Emergency Medicine

## 2020-12-08 DIAGNOSIS — M25522 Pain in left elbow: Secondary | ICD-10-CM

## 2020-12-08 DIAGNOSIS — M7022 Olecranon bursitis, left elbow: Secondary | ICD-10-CM

## 2020-12-08 MED ORDER — PREDNISONE 20 MG PO TABS: 20.0000 mg | ORAL_TABLET | Freq: Two times a day (BID) | ORAL | 0 refills | Status: AC

## 2020-12-08 NOTE — Discharge Instructions (Signed)
Continue conservative management of rest, ice, and elevation Ace applied Prednisone prescribed.  Take as directed and to completion Follow up with orthopedist Return or go to the ER if you have any new or worsening symptoms (fever, chills, chest pain, redness, swelling, bruising, deformity, etc...)

## 2020-12-08 NOTE — ED Triage Notes (Signed)
Pain from left index finger radiating up to left elbow x 1 week

## 2020-12-08 NOTE — ED Provider Notes (Signed)
[facility]   [id number] 12/08/20 Arrival Time: 0916  CC: LT elbow pain  SUBJECTIVE: History from: patient. [name] [name] is a 75 y.o. female complains of LT elbow pain and swelling x 1 week.  Denies a precipitating event or specific injury.  Localizes the pain to the forearm and outside of elbow.  Describes the pain as constant and sore in character.  Has tried OTC medications without relief.  Symptoms are made worse with ROM about the elbow.  Denies similar symptoms in the past.  Complains of associated swelling and radiating from wrist to elbow.  Denies fever, chills, erythema, ecchymosis, weakness, numbness and tingling.    ROS: As per HPI.  All other pertinent ROS negative.     Past Medical History:  Diagnosis Date  . Acute hypoxemic respiratory failure due to COVID-19 (HCC) 06/29/2020  . Anxiety   . Aortic stenosis   . Arthritis   . Biventricular ICD (implantable cardioverter-defibrillator) in place    March 2021, [employer]  -Dr. Taylor  . Chronic combined systolic and diastolic CHF (congestive heart failure) (HCC)   . CKD (chronic kidney disease) stage 3, GFR 30-59 ml/min (HCC)   . Constipation   . COVID-19 virus infection 07/16/2020  . Depression   . Diabetes mellitus, type 2 (HCC)   . Essential hypertension   . Hyperlipidemia   . Incidental pulmonary nodule, > 3mm and < 8mm    7 x 4 mm LLL nodule  . LBBB (left bundle branch block)   . Nonischemic cardiomyopathy (HCC)    No significant obstructive CAD at heart catheterization 05/2014, LVEF 20%  . Obesity   . Pneumonia 2013  . 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 Symptomatic PVCs    Past Surgical History:  Procedure Laterality Date  . BIV ICD INSERTION CRT-D N/A 10/28/2019   Procedure: BIV ICD INSERTION CRT-D;  Surgeon: Taylor, Gregg W, MD;  [facility];  Service:  Cardiovascular;  Laterality: N/A;  . CARDIAC CATHETERIZATION    . CESAREAN SECTION  1987  . FLEXIBLE BRONCHOSCOPY N/A 01/14/2015   Procedure: FLEXIBLE BRONCHOSCOPY;  Surgeon: Steven C Hendrickson, MD;  [facility]: [facility];  Service: Thoracic;  Laterality: N/A;  . LEFT AND RIGHT HEART CATHETERIZATION WITH CORONARY ANGIOGRAM N/A 12/05/2011   Procedure: LEFT AND RIGHT HEART CATHETERIZATION WITH CORONARY ANGIOGRAM;  Surgeon: Christopher D McAlhany, MD;  [facility];  Service: Cardiovascular;  Laterality: N/A;  . LEFT AND RIGHT HEART CATHETERIZATION WITH CORONARY ANGIOGRAM N/A 05/23/2014   Procedure: LEFT AND RIGHT HEART CATHETERIZATION WITH CORONARY ANGIOGRAM;  Surgeon: Christopher D McAlhany, MD;  [facility];  Service: Cardiovascular;  Laterality: N/A;  . MULTIPLE EXTRACTIONS WITH ALVEOLOPLASTY N/A 02/27/2014   Procedure: Extraction of tooth #'s 2,4,5,6,7,8,9,10,11,12, 22, 23, 24, 25, 26, 28 with alveoloplasty and bilateral mandibular tori reductions;  Surgeon: Ronald F Kulinski, DDS;  [facility];  Service: Oral Surgery;  Laterality: N/A;  . RIGID BRONCHOSCOPY N/A 01/14/2015   Procedure: RIGID BRONCHOSCOPY with Removal Of Foreign Body ;  Surgeon: Steven C Hendrickson, MD;  [facility];  Service: Thoracic;  Laterality: N/A;  . TEE WITHOUT CARDIOVERSION N/A 03/24/2015   Procedure: TRANSESOPHAGEAL ECHOCARDIOGRAM (TEE);  Surgeon: Christopher D McAlhany, MD;  [facility];  Service: Open Heart Surgery;  Laterality: N/A;  . TRANSCATHETER AORTIC VALVE REPLACEMENT, TRANSFEMORAL Bilateral 03/24/2015   Procedure:   TRANSCATHETER AORTIC VALVE REPLACEMENT, TRANSFEMORAL;  Surgeon: Christopher D McAlhany, MD;  [facility];  Service: Open Heart Surgery;  Laterality: Bilateral;  . TUBAL LIGATION  1987  . VIDEO BRONCHOSCOPY Bilateral 06/05/2014   Procedure: VIDEO BRONCHOSCOPY WITHOUT FLUORO;  Surgeon: Douglas B McQuaid, MD;  [facility];  Service: Cardiopulmonary;  Laterality:  Bilateral;   Allergies  Allergen Reactions  . Crestor [Rosuvastatin] Other (See Comments)    Muscle aches and elevated CK  . Tramadol Nausea Only    States blood sugar elevated also  . Penicillins Rash and Other (See Comments)    Family unsure of reaction, but certain mom said she was allergic Did it involve swelling of the face/tongue/throat, SOB, or low BP? No Did it involve sudden or severe rash/hives, skin peeling, or any reaction on the inside of your mouth or nose? Yes Did you need to seek medical attention at a hospital or doctor's office? Yes When did it last happen?10 + years If all above answers are "NO", may proceed with cephalosporin use.    No current facility-administered medications on file prior to encounter.   Current Outpatient Medications on File Prior to Encounter  Medication Sig Dispense Refill  . acetaminophen (TYLENOL) 500 MG tablet Take one tablet two times daily, by mouth, for knee  pain (Patient taking differently: Take 500 mg by mouth 2 (two) times daily as needed for moderate pain. Take one tablet two times daily, by mouth, for knee  pain) 60 tablet 3  . allopurinol (ZYLOPRIM) 100 MG tablet Take by mouth.    . aspirin EC 81 MG tablet Take 81 mg by mouth every morning.     . blood glucose meter kit and supplies Dispense based on patient and insurance preference. Use up to three times daily dx E11.65 1 each 0  . blood glucose meter kit and supplies Dispense based on patient and insurance preference. Use to test three times daily (FOR ICD-10 E10.9, E11.9). 1 each 0  . carvedilol (COREG) 25 MG tablet TAKE 1 TABLET BY MOUTH TWICE DAILY WITH MEALS. 60 tablet 6  . cetirizine (ZYRTEC) 10 MG tablet Take 1 tablet (10 mg total) by mouth daily. 30 tablet 1  . cholecalciferol (VITAMIN D3) 25 MCG (1000 UNIT) tablet Take 1,000 Units by mouth daily.    . diclofenac Sodium (VOLTAREN) 1 % GEL Apply 1 application topically 4 (four) times daily as needed (pain).    .  ezetimibe (ZETIA) 10 MG tablet TAKE ONE TABLET BY MOUTH ONCE DAILY. 90 tablet 0  . fenofibrate (TRICOR) 145 MG tablet TAKE (1) TABLET BY MOUTH ONCE DAILY. 30 tablet 0  . furosemide (LASIX) 40 MG tablet TAKE (1) TABLET BY MOUTH EACH MORNING. 30 tablet 0  . glipiZIDE (GLUCOTROL XL) 2.5 MG 24 hr tablet TAKE ONE TABLET BY MOUTH ONCE DAILY WITH BREAKFAST. 30 tablet 0  . Lancets (ONETOUCH DELICA PLUS LANCET33G) MISC USE 3 TIMES A DAY AS DIRECTED. 100 each 0  . LITETOUCH PEN NEEDLES 31G X 8 MM MISC USE AS NEEDED TO INJECT INSULIN. 100 each 0  . LIVALO 2 MG TABS TAKE ONE TABLET BY MOUTH ONCE DAILY AFTER SUPPER. 90 tablet 0  . Multiple Vitamins-Minerals (MULTIVITAMIN PO) Take 1 tablet by mouth every morning.     . NOVOLOG MIX 70/30 FLEXPEN (70-30) 100 UNIT/ML FlexPen INJECT 12 TO 15 UNITS SUBCUTANEOUSLY TWICE DAILY. 15 mL 0  . ONETOUCH ULTRA test strip CHECK BLOOD SUGAR 3 TIMES A DAY. 100 strip 0  .   potassium chloride (KLOR-CON) 10 MEQ tablet Once daily 30 tablet 6  . spironolactone (ALDACTONE) 25 MG tablet TAKE ONE TABLET BY MOUTH ONCE DAILY. 90 tablet 0  . UNABLE TO FIND Shower bench x 1   DX M17.12 1 each 0  . UNABLE TO FIND Rolling walker with seat x 1  DX m17.12 1 each 0  . [DISCONTINUED] ferrous sulfate 325 (65 FE) MG EC tablet Take 1 tablet (325 mg total) by mouth 2 (two) times daily. 60 tablet 5  . [DISCONTINUED] FLUoxetine (PROZAC) 10 MG tablet Take 1 tablet (10 mg total) by mouth daily. Take one capsule by mouth once a day 30 tablet 3   Social History   Socioeconomic History  . Marital status: Widowed    Spouse name: Not on file  . Number of children: 5  . Years of education: Not on file  . Highest education level: Not on file  Occupational History  . Occupation: Retired  Tobacco Use  . Smoking status: Never Smoker  . Smokeless tobacco: Never Used  Vaping Use  . Vaping Use: Never used  Substance and Sexual Activity  . Alcohol use: No  . Drug use: No  . Sexual activity: Never     Birth control/protection: None, Post-menopausal  Other Topics Concern  . Not on file  Social History Narrative  . Not on file   Social Determinants of Health   Financial Resource Strain: Low Risk   . Difficulty of Paying Living Expenses: Not hard at all  Food Insecurity: No Food Insecurity  . Worried About Running Out of Food in the Last Year: Never true  . Ran Out of Food in the Last Year: Never true  Transportation Needs: No Transportation Needs  . Lack of Transportation (Medical): No  . Lack of Transportation (Non-Medical): No  Physical Activity: Insufficiently Active  . Days of Exercise per Week: 7 days  . Minutes of Exercise per Session: 20 min  Stress: No Stress Concern Present  . Feeling of Stress : Not at all  Social Connections: Moderately Isolated  . Frequency of Communication with Friends and Family: More than three times a week  . Frequency of Social Gatherings with Friends and Family: More than three times a week  . Attends Religious Services: More than 4 times per year  . Active Member of Clubs or Organizations: No  . Attends Club or Organization Meetings: Never  . Marital Status: Widowed  Intimate Partner Violence: Not At Risk  . Fear of Current or Ex-Partner: No  . Emotionally Abused: No  . Physically Abused: No  . Sexually Abused: No   Family History  Problem Relation Age of Onset  . Heart disease Mother   . Heart attack Mother 83  . Diabetes Mother   . Hypertension Mother   . Colon cancer Father 80  . Breast cancer Sister   . Diabetes Brother   . Hypertension Brother   . Stroke Brother   . Cancer Brother   . Congestive Heart Failure Daughter   . Hypertension Son   . High Cholesterol Son   . Congestive Heart Failure Son     OBJECTIVE:  Vitals:   12/08/20 0922  BP: 132/85  Pulse: 91  Resp: 18  Temp: 98.7 F (37.1 C)  TempSrc: Oral  SpO2: 97%    General appearance: ALERT; in no acute distress.  Head: NCAT Lungs: Normal respiratory  effort CV: Radial pulse 2+ Musculoskeletal: LT elbow  Inspection: Swelling over olecranon process   Palpation: Diffusely TTP over olecranon and lateral elbow ROM: FROM active and passive Strength: 5/5 elbow flexion, 5/5 elbow extension, 5/5 grip strength Skin: warm and dry Neurologic: Ambulates without difficulty; Sensation intact about the upper extremities Psychological: alert and cooperative; normal mood and affect  ASSESSMENT & PLAN:  1. Left elbow pain   2. Olecranon bursitis of left elbow     Meds ordered this encounter  Medications  . predniSONE (DELTASONE) 20 MG tablet    Sig: Take 1 tablet (20 mg total) by mouth 2 (two) times daily with a meal for 5 days.    Dispense:  10 tablet    Refill:  0    Order Specific Question:   Supervising Provider    Answer:   NELSON, YVONNE SUE [[id number]]    Continue conservative management of rest, ice, and elevation Ace applied Prednisone prescribed.  Take as directed and to completion Follow up with orthopedist Return or go to the ER if you have any new or worsening symptoms (fever, chills, chest pain, redness, swelling, bruising, deformity, etc...)   Reviewed expectations re: course of current medical issues. Questions answered. Outlined signs and symptoms indicating need for more acute intervention. Patient verbalized understanding. After Visit Summary given.    Wurst, Brittany, PA-C 12/08/20 0958  

## 2020-12-09 ENCOUNTER — Other Ambulatory Visit: Payer: Self-pay

## 2020-12-09 MED ORDER — LITETOUCH PEN NEEDLES 31G X 8 MM MISC: 5 refills | Status: DC

## 2020-12-09 NOTE — Telephone Encounter (Signed)
Pen needles sent in for bid

## 2020-12-10 ENCOUNTER — Other Ambulatory Visit: Payer: Self-pay | Admitting: Family Medicine

## 2020-12-10 DIAGNOSIS — M25522 Pain in left elbow: Secondary | ICD-10-CM | POA: Diagnosis not present

## 2020-12-11 ENCOUNTER — Other Ambulatory Visit: Payer: Self-pay | Admitting: Family Medicine

## 2020-12-12 ENCOUNTER — Encounter (HOSPITAL_COMMUNITY): Payer: Self-pay

## 2020-12-12 ENCOUNTER — Other Ambulatory Visit: Payer: Self-pay

## 2020-12-12 ENCOUNTER — Emergency Department (HOSPITAL_COMMUNITY)
Admission: EM | Admit: 2020-12-12 | Discharge: 2020-12-12 | Disposition: A | Discharge status: Home / Self Care | Payer: PPO | Attending: Emergency Medicine | Admitting: Emergency Medicine

## 2020-12-12 ENCOUNTER — Emergency Department (HOSPITAL_COMMUNITY): Payer: PPO

## 2020-12-12 DIAGNOSIS — Y939 Activity, unspecified: Secondary | ICD-10-CM | POA: Diagnosis not present

## 2020-12-12 DIAGNOSIS — Z794 Long term (current) use of insulin: Secondary | ICD-10-CM | POA: Insufficient documentation

## 2020-12-12 DIAGNOSIS — M25422 Effusion, left elbow: Secondary | ICD-10-CM | POA: Diagnosis not present

## 2020-12-12 DIAGNOSIS — Z79899 Other long term (current) drug therapy: Secondary | ICD-10-CM | POA: Diagnosis not present

## 2020-12-12 DIAGNOSIS — I5042 Chronic combined systolic (congestive) and diastolic (congestive) heart failure: Secondary | ICD-10-CM | POA: Insufficient documentation

## 2020-12-12 DIAGNOSIS — Z7982 Long term (current) use of aspirin: Secondary | ICD-10-CM | POA: Insufficient documentation

## 2020-12-12 DIAGNOSIS — Z7984 Long term (current) use of oral hypoglycemic drugs: Secondary | ICD-10-CM | POA: Diagnosis not present

## 2020-12-12 DIAGNOSIS — M25522 Pain in left elbow: Secondary | ICD-10-CM | POA: Diagnosis present

## 2020-12-12 DIAGNOSIS — I130 Hypertensive heart and chronic kidney disease with heart failure and stage 1 through stage 4 chronic kidney disease, or unspecified chronic kidney disease: Secondary | ICD-10-CM | POA: Diagnosis not present

## 2020-12-12 DIAGNOSIS — Z8616 Personal history of COVID-19: Secondary | ICD-10-CM | POA: Insufficient documentation

## 2020-12-12 DIAGNOSIS — E1122 Type 2 diabetes mellitus with diabetic chronic kidney disease: Secondary | ICD-10-CM | POA: Diagnosis not present

## 2020-12-12 DIAGNOSIS — Z954 Presence of other heart-valve replacement: Secondary | ICD-10-CM | POA: Insufficient documentation

## 2020-12-12 DIAGNOSIS — M7032 Other bursitis of elbow, left elbow: Secondary | ICD-10-CM | POA: Diagnosis not present

## 2020-12-12 DIAGNOSIS — N184 Chronic kidney disease, stage 4 (severe): Secondary | ICD-10-CM | POA: Insufficient documentation

## 2020-12-12 DIAGNOSIS — M79602 Pain in left arm: Secondary | ICD-10-CM | POA: Diagnosis not present

## 2020-12-12 MED ORDER — DEXAMETHASONE SODIUM PHOSPHATE 10 MG/ML IJ SOLN
6.0000 mg | Freq: Once | INTRAMUSCULAR | Status: AC
  Administered 2020-12-12: 6 mg via INTRAMUSCULAR
  Filled 2020-12-12: qty 1

## 2020-12-12 MED ORDER — ACETAMINOPHEN 500 MG PO TABS
1000.0000 mg | ORAL_TABLET | Freq: Once | ORAL | Status: AC
  Administered 2020-12-12: 1000 mg via ORAL
  Filled 2020-12-12: qty 2

## 2020-12-12 NOTE — Discharge Instructions (Addendum)
Use tylenol every 4 hrs as needed for pain. Use ice 3 times a day- crushed ice or bag of frozen pees for 10 minutes at a time. Follow up with bone doctor this week. Return for fevers, or new concerns.

## 2020-12-12 NOTE — ED Provider Notes (Addendum)
[facility] Provider Note   CSN: [id number] Arrival date & time: 12/12/20  1202     History Chief Complaint  Patient presents with  . Arm Pain    [name] [name] is a 75 y.o. female.  Patient with history of cardiomyopathy, CHF presents with persistent left elbow pain with radiation up to the hand.  Patient has had this over the past few weeks has seen urgent care and orthopedics.  Patient was told bursitis and given prednisone however with her diabetes she has been cautious about taking it.  Patient took 1 dose of 20 mg prednisone however did not take it the following day.  Patient has taken Tylenol for the pain.  No fevers or chills.  Patient's had arthritis in the past.  Patient is waiting for an appointment with local orthopedics however saw one in [place] and had x-ray unknown results.        Past Medical History:  Diagnosis Date  . Acute hypoxemic respiratory failure due to COVID-19 ([place]) 06/29/2020  . Anxiety   . Aortic stenosis   . Arthritis   . Biventricular ICD (implantable cardioverter-defibrillator) in place    March 2021, [employer]  -[name]  . Chronic combined systolic and diastolic CHF (congestive heart failure) ([place])   . CKD (chronic kidney disease) stage 3, GFR 30-59 ml/min (HCC)   . Constipation   . COVID-19 virus infection 07/16/2020  . Depression   . Diabetes mellitus, type 2 ([place])   . Essential hypertension   . Hyperlipidemia   . Incidental pulmonary nodule, > 57m and < 847m   7 x 4 mm LLL nodule  . LBBB (left bundle branch block)   . Nonischemic cardiomyopathy (HCC)    No significant obstructive CAD at heart catheterization 05/2014, LVEF 20%  . Obesity   . Pneumonia 2013  . 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 Symptomatic PVCs     Patient Active Problem List   Diagnosis Date Noted  . Hospital  discharge follow-up 07/16/2020  . CKD (chronic kidney disease), stage IV ([facility]11/15/2021  . Hyperlipidemia associated with type 2 diabetes mellitus ([facility]11/15/2021  . Benign essential hypertension 05/27/2020  . Chronic combined systolic and diastolic heart failure ([facility]10/13/2021  . Generalized osteoarthritis 07/26/2019  . Unilateral primary osteoarthritis, left knee 03/16/2017  . Annual physical exam 08/18/2015  . History of aortic valve replacement 03/24/2015  . Hyperlipidemia 05/24/2014  . Severe aortic stenosis 05/23/2014  . Solitary pulmonary nodule 02/10/2014  . Left bundle-branch block 12/03/2013  . Type 2 DM with CKD stage 4 and hypertension (HC[facility]10/02/2013  . Seasonal allergies 02/22/2012  . Cardiomyopathy ([facility]03/08/2011  . Type 2 diabetes mellitus ([facility]03/06/2008  . Body mass index (BMI) 26.0-26.9, adult 10/24/2007    Past Surgical History:  Procedure Laterality Date  . BIV ICD INSERTION CRT-D N/A 10/28/2019   Procedure: BIV ICD INSERTION CRT-D;  Surgeon: TaEvans LanceMD;  [facility];  Service: Cardiovascular;  Laterality: N/A;  . CARDIAC CATHETERIZATION    . CESAREAN SECTION  1987  . FLEXIBLE BRONCHOSCOPY N/A 01/14/2015   Procedure: FLEXIBLE BRONCHOSCOPY;  Surgeon: StMelrose NakayamaMD;  [facility]: [facility];  Laterality: N/A;  . LEFT AND RIGHT HEART CATHETERIZATION WITH CORONARY ANGIOGRAM N/A 12/05/2011   Procedure: LEFT AND RIGHT HEART CATHETERIZATION WITH CORONARY ANGIOGRAM;  Surgeon: [name], MD;  Location: [facility] CATH LAB;  Service: Cardiovascular;  Laterality: N/A;  . LEFT AND RIGHT HEART CATHETERIZATION WITH CORONARY ANGIOGRAM N/A 05/23/2014   Procedure: LEFT AND RIGHT HEART CATHETERIZATION WITH CORONARY ANGIOGRAM;  Surgeon: [name], MD;  Location: [facility] CATH LAB;  Service: Cardiovascular;  Laterality: N/A;  . MULTIPLE EXTRACTIONS WITH ALVEOLOPLASTY N/A 02/27/2014   Procedure: Extraction of tooth #'s  2,4,5,6,7,8,9,10,11,12, 22, 23, 24, 25, 26, 28 with alveoloplasty and bilateral mandibular tori reductions;  Surgeon: [name], DDS;  Location: [facility];  Service: Oral Surgery;  Laterality: N/A;  . RIGID BRONCHOSCOPY N/A 01/14/2015   Procedure: RIGID BRONCHOSCOPY with Removal Of Foreign Body ;  Surgeon: [name], MD;  Location: [place];  Service: Thoracic;  Laterality: N/A;  . TEE WITHOUT CARDIOVERSION N/A 03/24/2015   Procedure: TRANSESOPHAGEAL ECHOCARDIOGRAM (TEE);  Surgeon: [name], MD;  Location: [facility] [place];  Service: Open Heart Surgery;  Laterality: N/A;  . TRANSCATHETER AORTIC VALVE REPLACEMENT, TRANSFEMORAL Bilateral 03/24/2015   Procedure: TRANSCATHETER AORTIC VALVE REPLACEMENT, TRANSFEMORAL;  Surgeon: [name], MD;  Location: [place];  Service: Open Heart Surgery;  Laterality: Bilateral;  . TUBAL LIGATION  1987  . VIDEO BRONCHOSCOPY Bilateral 06/05/2014   Procedure: VIDEO BRONCHOSCOPY WITHOUT FLUORO;  Surgeon: [name], MD;  Location: [facility] ENDOSCOPY;  Service: Cardiopulmonary;  Laterality: Bilateral;     OB History   No obstetric history on file.     Family History  Problem Relation Age of Onset  . Heart disease Mother   . Heart attack Mother 66  . Diabetes Mother   . Hypertension Mother   . Colon cancer Father 58  . Breast cancer Sister   . Diabetes Brother   . Hypertension Brother   . Stroke Brother   . Cancer Brother   . Congestive Heart Failure Daughter   . Hypertension Son   . High Cholesterol Son   . Congestive Heart Failure Son     Social History   Tobacco Use  . Smoking status: Never Smoker  . Smokeless tobacco: Never Used  Vaping Use  . Vaping Use: Never used  Substance Use Topics  . Alcohol use: No  . Drug use: No    Home Medications Prior to Admission medications   Medication Sig Start Date End Date Taking? Authorizing Provider  acetaminophen (TYLENOL) 500 MG tablet Take one tablet two times daily, by mouth,  for knee  pain Patient taking differently: Take 500 mg by mouth 2 (two) times daily as needed for moderate pain. Take one tablet two times daily, by mouth, for knee  pain 07/18/19   Fayrene Helper, MD  allopurinol (ZYLOPRIM) 100 MG tablet Take by mouth. 11/04/20 11/04/21  [provider]  aspirin EC 81 MG tablet Take 81 mg by mouth every morning.     [provider]  blood glucose meter kit and supplies Dispense based on patient and insurance preference. Use up to three times daily dx E11.65 11/04/20   Fayrene Helper, MD  blood glucose meter kit and supplies Dispense based on patient and insurance preference. Use to test three times daily (FOR ICD-10 E10.9, E11.9). 12/02/20   Fayrene Helper, MD  carvedilol (COREG) 25 MG tablet TAKE 1 TABLET BY MOUTH TWICE DAILY WITH MEALS. 12/04/20   [name], MD  cetirizine (ZYRTEC) 10 MG tablet Take 1 tablet (10 mg total) by mouth daily. 07/29/20 09/27/20  Lindell Spar, MD  cholecalciferol (VITAMIN D3) 25 MCG (1000 UNIT) tablet Take 1,000 Units by mouth daily.    [provider]  diclofenac Sodium (VOLTAREN) 1 % GEL Apply 1 application topically 4 (four) times daily as needed (pain).    [provider]  ezetimibe (ZETIA) 10 MG tablet TAKE ONE TABLET BY MOUTH ONCE DAILY. 10/23/20   Fayrene Helper, MD  fenofibrate (TRICOR) 145 MG tablet TAKE (1) TABLET BY MOUTH ONCE DAILY. 11/17/20   Fayrene Helper, MD  furosemide (LASIX) 40 MG tablet TAKE (1) TABLET BY MOUTH EACH MORNING. 12/10/20   Fayrene Helper, MD  glipiZIDE (GLUCOTROL XL) 2.5 MG 24 hr tablet TAKE ONE TABLET BY MOUTH ONCE DAILY WITH BREAKFAST. 12/03/20   Fayrene Helper, MD  Insulin Pen Needle (LITETOUCH PEN NEEDLES) 31G X 8 MM MISC Use to inject insulin twice daily Dx e11.65 12/09/20   Fayrene Helper, MD  Lancets (ONETOUCH DELICA PLUS ZOXWRU04V) [place] USE 3 TIMES A DAY AS DIRECTED. 09/25/20   Fayrene Helper, MD  LIVALO 2 MG TABS TAKE  ONE TABLET BY MOUTH ONCE DAILY AFTER SUPPER. 12/03/20   Noreene Larsson, NP  Multiple Vitamins-Minerals (MULTIVITAMIN PO) Take 1 tablet by mouth every morning.     [provider]  NOVOLOG MIX 70/30 FLEXPEN (70-30) 100 UNIT/ML FlexPen INJECT 12 TO 15 UNITS SUBCUTANEOUSLY TWICE DAILY. 12/11/20   Fayrene Helper, MD  [facility] ULTRA test strip CHECK BLOOD SUGAR 3 TIMES A DAY. 10/30/20   Fayrene Helper, MD  potassium chloride (KLOR-CON) 10 MEQ tablet Once daily 09/28/20   Fayrene Helper, MD  predniSONE (DELTASONE) 20 MG tablet Take 1 tablet (20 mg total) by mouth 2 (two) times daily with a meal for 5 days. 12/08/20 12/13/20  Wurst, [place], PA-C  spironolactone (ALDACTONE) 25 MG tablet TAKE ONE TABLET BY MOUTH ONCE DAILY. 10/23/20   Fayrene Helper, MD  UNABLE TO FIND Shower bench x 1   DX W09.81 05/02/19   Fayrene Helper, MD  UNABLE TO FIND Rolling walker with seat x 1  DX m17.12 05/02/19   Fayrene Helper, MD  ferrous sulfate 325 (65 FE) MG EC tablet Take 1 tablet (325 mg total) by mouth 2 (two) times daily. 07/14/11 10/28/11  Fayrene Helper, MD  FLUoxetine (PROZAC) 10 MG tablet Take 1 tablet (10 mg total) by mouth daily. Take one capsule by mouth once a day 12/20/10 11/03/11  Fayrene Helper, MD    Allergies    Crestor [rosuvastatin], Tramadol, and Penicillins  Review of Systems   Review of Systems  Constitutional: Negative for chills and fever.  HENT: Negative for congestion.   Eyes: Negative for visual disturbance.  Respiratory: Negative for shortness of breath.   Cardiovascular: Negative for chest pain.  Gastrointestinal: Negative for abdominal pain and vomiting.  Genitourinary: Negative for dysuria and flank pain.  Musculoskeletal: Positive for joint swelling. Negative for back pain, neck pain and neck stiffness.  Skin: Negative for rash.  Neurological: Negative for light-headedness and headaches.    Physical Exam Updated Vital Signs BP 109/89 (BP  Location: Right Arm)   Pulse 79   Temp 99.1 F (37.3 C) (Oral)   Resp 18   Ht $R'5\' 5"'hU$  (1.651 m)   Wt 79.4 kg   SpO2 96%   BMI 29.12 kg/m   Physical Exam Vitals and nursing note reviewed.  Constitutional:      Appearance: She is well-developed.  HENT:     Head:  Normocephalic and atraumatic.  Eyes:     General:        Right eye: No discharge.        Left eye: No discharge.     Conjunctiva/sclera: Conjunctivae normal.  Neck:     Trachea: No tracheal deviation.  Cardiovascular:     Rate and Rhythm: Normal rate.  Pulmonary:     Effort: Pulmonary effort is normal.  Abdominal:     General: There is no distension.  Musculoskeletal:        General: Swelling and tenderness present. No signs of injury.     Cervical back: Normal range of motion and neck supple.     Comments: Patient has tenderness to palpation lateral left elbow with mild joint effusion.  Patient can flex with minimal discomfort however cannot fully extend due to pain.  Compartments soft distally.  Mild radiation up to fingers with movement.  No external signs infection, no warmth, no erythema.  Skin:    General: Skin is warm.     Findings: No rash.  Neurological:     Mental Status: She is alert and oriented to person, place, and time.  Psychiatric:        Mood and Affect: Mood normal.     ED Results / Procedures / Treatments   Labs (all labs ordered are listed, but only abnormal results are displayed) Labs Reviewed - No data to display  EKG None  Radiology No results found.  Procedures Procedures   Medications Ordered in ED Medications  dexamethasone (DECADRON) injection 6 mg (has no administration in time range)  acetaminophen (TYLENOL) tablet 1,000 mg (has no administration in time range)    ED Course  I have reviewed the triage vital signs and the nursing notes.  Pertinent labs & imaging results that were available during my care of the patient were reviewed by me and considered in my medical  decision making (see chart for details).    MDM Rules/Calculators/A&P                          Patient presents with persistent elbow pain for a few weeks, low suspicion for septic joint due to duration, no fevers, recent orthopedic visit.  Other differentials include bursitis/arthritis, reviewed medical records and no recent x-ray report visualized, repeat x-ray ordered.  Discussed risk and benefits of different treatment options, patient not able to have NSAIDs due to cardiac history, kidney failure history and age.  Tylenol ordered for pain, recommended ice.  We agreed to Decadron 1 dose and she will monitor her glucose closely.  Patient will follow-up closely with orthopedics.  X-ray reviewed showing spurring, effusion seen.  Reassessment patient well-appearing, medications given. Final Clinical Impression(s) / ED Diagnoses Final diagnoses:  Bursitis of left elbow, unspecified bursa    Rx / DC Orders ED Discharge Orders    None       Elnora Morrison, MD 12/12/20 1551    Elnora Morrison, MD 12/12/20 [id number]

## 2020-12-12 NOTE — ED Triage Notes (Signed)
Pt to er, pt states that she is here for L arm pain, states that she has gone to urgent care and they gave her some prednisone that makes her sugar go up, and sent her to another doctor,states that he wouldn't give her a shot, and said to come back if it got worse.  States that she is still having pain.

## 2020-12-21 ENCOUNTER — Other Ambulatory Visit: Payer: Self-pay | Admitting: Family Medicine

## 2020-12-21 ENCOUNTER — Telehealth: Payer: Self-pay

## 2020-12-21 ENCOUNTER — Other Ambulatory Visit: Payer: Self-pay

## 2020-12-21 ENCOUNTER — Ambulatory Visit (HOSPITAL_COMMUNITY)
Admission: RE | Admit: 2020-12-21 | Discharge: 2020-12-21 | Disposition: A | Discharge status: Home / Self Care | Payer: PPO | Source: Ambulatory Visit | Attending: Family Medicine | Admitting: Family Medicine

## 2020-12-21 DIAGNOSIS — Z952 Presence of prosthetic heart valve: Secondary | ICD-10-CM | POA: Insufficient documentation

## 2020-12-21 DIAGNOSIS — I5042 Chronic combined systolic (congestive) and diastolic (congestive) heart failure: Secondary | ICD-10-CM | POA: Diagnosis not present

## 2020-12-21 DIAGNOSIS — Z79899 Other long term (current) drug therapy: Secondary | ICD-10-CM

## 2020-12-21 LAB — ECHOCARDIOGRAM COMPLETE
AR max vel: 0.45 cm2
AV Area VTI: 0.46 cm2
AV Area mean vel: 0.52 cm2
AV Mean grad: 13.5 mmHg
AV Peak grad: 27.2 mmHg
Ao pk vel: 2.61 m/s
Area-P 1/2: 4.8 cm2
Calc EF: 25.3 %
MV M vel: 5.38 m/s
MV Peak grad: 115.8 mmHg
S' Lateral: 5.7 cm
Single Plane A2C EF: 28.4 %
Single Plane A4C EF: 15.8 %

## 2020-12-21 NOTE — Telephone Encounter (Signed)
I spoke with patient, echo results given. She will have BMET tomorrow, 5/10 at [facility] and will see Dr.McDowell on 5/12 at 1:20 pm

## 2020-12-21 NOTE — Telephone Encounter (Signed)
-----   Message from [name], MD sent at 12/21/2020 11:51 AM EDT ----- Results reviewed.  LVEF remains significantly reduced, 15 to 20% range.  TAVR prosthesis functioning normally.  She should have a visit scheduled for follow-up.  Go-ahead get BMET, we are going to need to adjust some of her medications most likely.

## 2020-12-21 NOTE — Progress Notes (Signed)
*  PRELIMINARY RESULTS* Echocardiogram 2D Echocardiogram has been performed.  [name] [name] 12/21/2020, 11:21 AM

## 2020-12-22 ENCOUNTER — Emergency Department (HOSPITAL_COMMUNITY): Payer: PPO

## 2020-12-22 ENCOUNTER — Encounter (HOSPITAL_COMMUNITY): Payer: Self-pay | Admitting: *Deleted

## 2020-12-22 ENCOUNTER — Emergency Department (HOSPITAL_COMMUNITY)
Admission: EM | Admit: 2020-12-22 | Discharge: 2020-12-22 | Disposition: A | Discharge status: Home / Self Care | Payer: PPO | Attending: Emergency Medicine | Admitting: Emergency Medicine

## 2020-12-22 ENCOUNTER — Other Ambulatory Visit: Payer: Self-pay

## 2020-12-22 DIAGNOSIS — Z7982 Long term (current) use of aspirin: Secondary | ICD-10-CM | POA: Diagnosis not present

## 2020-12-22 DIAGNOSIS — N184 Chronic kidney disease, stage 4 (severe): Secondary | ICD-10-CM | POA: Insufficient documentation

## 2020-12-22 DIAGNOSIS — R079 Chest pain, unspecified: Secondary | ICD-10-CM | POA: Diagnosis not present

## 2020-12-22 DIAGNOSIS — Z79899 Other long term (current) drug therapy: Secondary | ICD-10-CM | POA: Diagnosis not present

## 2020-12-22 DIAGNOSIS — R0789 Other chest pain: Secondary | ICD-10-CM | POA: Diagnosis not present

## 2020-12-22 DIAGNOSIS — E1122 Type 2 diabetes mellitus with diabetic chronic kidney disease: Secondary | ICD-10-CM | POA: Diagnosis not present

## 2020-12-22 DIAGNOSIS — Z794 Long term (current) use of insulin: Secondary | ICD-10-CM | POA: Diagnosis not present

## 2020-12-22 DIAGNOSIS — I5042 Chronic combined systolic (congestive) and diastolic (congestive) heart failure: Secondary | ICD-10-CM | POA: Diagnosis not present

## 2020-12-22 DIAGNOSIS — I130 Hypertensive heart and chronic kidney disease with heart failure and stage 1 through stage 4 chronic kidney disease, or unspecified chronic kidney disease: Secondary | ICD-10-CM | POA: Diagnosis not present

## 2020-12-22 DIAGNOSIS — Z8616 Personal history of COVID-19: Secondary | ICD-10-CM | POA: Diagnosis not present

## 2020-12-22 DIAGNOSIS — R0602 Shortness of breath: Secondary | ICD-10-CM | POA: Diagnosis not present

## 2020-12-22 LAB — BASIC METABOLIC PANEL
Anion gap: 10 (ref 5–15)
BUN: 40 mg/dL — ABNORMAL HIGH (ref 8–23)
CO2: 23 mmol/L (ref 22–32)
Calcium: 9.8 mg/dL (ref 8.9–10.3)
Chloride: 104 mmol/L (ref 98–111)
Creatinine, Ser: 1.69 mg/dL — ABNORMAL HIGH (ref 0.44–1.00)
GFR, Estimated: 31 mL/min — ABNORMAL LOW (ref >60)
Glucose, Bld: 186 mg/dL — ABNORMAL HIGH (ref 70–99)
Potassium: 3.8 mmol/L (ref 3.5–5.1)
Sodium: 137 mmol/L (ref 135–145)

## 2020-12-22 LAB — CBC
HCT: 37.5 % (ref 36.0–46.0)
Hemoglobin: 11.9 g/dL — ABNORMAL LOW (ref 12.0–15.0)
MCH: 27.9 pg (ref 26.0–34.0)
MCHC: 31.7 g/dL (ref 30.0–36.0)
MCV: 87.8 fL (ref 80.0–100.0)
Platelets: 302 10*3/uL (ref 150–400)
RBC: 4.27 MIL/uL (ref 3.87–5.11)
RDW: 16.7 % — ABNORMAL HIGH (ref 11.5–15.5)
WBC: 8.9 10*3/uL (ref 4.0–10.5)
nRBC: 0 % (ref 0.0–0.2)

## 2020-12-22 LAB — TROPONIN I (HIGH SENSITIVITY)
Troponin I (High Sensitivity): 34 ng/L — ABNORMAL HIGH (ref <18)
Troponin I (High Sensitivity): 30 ng/L — ABNORMAL HIGH (ref <18)

## 2020-12-22 LAB — BRAIN NATRIURETIC PEPTIDE: B Natriuretic Peptide: 336 pg/mL — ABNORMAL HIGH (ref 0.0–100.0)

## 2020-12-22 MED ORDER — ACETAMINOPHEN 325 MG PO TABS
650.0000 mg | ORAL_TABLET | Freq: Once | ORAL | Status: AC
  Administered 2020-12-22: 650 mg via ORAL
  Filled 2020-12-22: qty 2

## 2020-12-22 NOTE — ED Triage Notes (Signed)
Pt c/o left sided chest pain and SOB x 2 days. Pt reports worse with movement. At one time the pain was going to her neck, but that has now resolved. Pt had Echo done yesterday and was told she had "something going on" and was to return to hospital in the morning for outpatient blood work. Pt reports the pain was worse this morning so she decided to come to the ED instead.

## 2020-12-22 NOTE — ED Provider Notes (Signed)
[facility] EMERGENCY DEPARTMENT Provider Note   CSN: [id number] Arrival date & time: 12/22/20  1657     History Chief Complaint  Patient presents with  . Chest Pain    [name] [name] is a 75 y.o. female.  Pt presents to the ED today with an "ache" in her chest.  Pt said she's had left sided cp for 2 days.  It is worse with movement.  Pt has been taking tylenol for it and it gets better, but comes back.  She had an ECHO yesterday which showed an EF of 15-20%.  She was told the results, but did not understand what it meant.  She just heard "there is something going on."  She was told to come back to the hospital for a bmet to adjust meds.  She came to the ED instead because she did not understand what was going on and she had the soreness in her chest.  She does get sob with lying flat.  She has not taken any of her meds today.        Past Medical History:  Diagnosis Date  . Acute hypoxemic respiratory failure due to COVID-19 ([place]) 06/29/2020  . Anxiety   . Aortic stenosis   . Arthritis   . Biventricular ICD (implantable cardioverter-defibrillator) in place    March 2021, [employer]  -[name]  . Chronic combined systolic and diastolic CHF (congestive heart failure) ([place])   . CKD (chronic kidney disease) stage 3, GFR 30-59 ml/min (HCC)   . Constipation   . COVID-19 virus infection 07/16/2020  . Depression   . Diabetes mellitus, type 2 ([place])   . Essential hypertension   . Hyperlipidemia   . Incidental pulmonary nodule, > 52m and < 878m   7 x 4 mm LLL nodule  . LBBB (left bundle branch block)   . Nonischemic cardiomyopathy (HCC)    No significant obstructive CAD at heart catheterization 05/2014, LVEF 20%  . Obesity   . Pneumonia 2013  . 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 Symptomatic PVCs     Patient Active Problem List   Diagnosis Date Noted  .  Hospital discharge follow-up 07/16/2020  . CKD (chronic kidney disease), stage IV ([facility]11/15/2021  . Hyperlipidemia associated with type 2 diabetes mellitus ([facility]CC[place]11/15/2021  . Benign essential hypertension 05/27/2020  . Chronic combined systolic and diastolic heart failure ([facility]10/13/2021  . Generalized osteoarthritis 07/26/2019  . Unilateral primary osteoarthritis, left knee 03/16/2017  . Annual physical exam 08/18/2015  . History of aortic valve replacement 03/24/2015  . Hyperlipidemia 05/24/2014  . Severe aortic stenosis 05/23/2014  . Solitary pulmonary nodule 02/10/2014  . Left bundle-branch block 12/03/2013  . Type 2 DM with CKD stage 4 and hypertension (HCSt[place]10/02/2013  . Seasonal allergies 02/22/2012  . Cardiomyopathy ([facility]CM[place]03/08/2011  . Type 2 diabetes mellitus (HC[place]03/06/2008  . Body mass index (BMI) 26.0-26.9, adult 10/24/2007    Past Surgical History:  Procedure Laterality Date  . BIV ICD INSERTION CRT-D N/A 10/28/2019   Procedure: BIV ICD INSERTION CRT-D;  Surgeon: TaEvans LanceMD;  [facility];  Service: Cardiovascular;  Laterality: N/A;  . CARDIAC CATHETERIZATION    . CESAREAN SECTION  1987  . FLEXIBLE BRONCHOSCOPY N/A 01/14/2015   Procedure: FLEXIBLE BRONCHOSCOPY;  Surgeon: StMelrose NakayamaMD;  [facility] Service: Thoracic;  Laterality: N/A;  . LEFT AND RIGHT HEART CATHETERIZATION WITH CORONARY ANGIOGRAM N/A 12/05/2011   Procedure: LEFT AND RIGHT HEART CATHETERIZATION WITH CORONARY ANGIOGRAM;  Surgeon: [name], MD;  Location[facility] LAB;  Service: Cardiovascular;  Laterality: N/A;  . LEFT AND RIGHT HEART CATHETERIZATION WITH CORONARY ANGIOGRAM N/A 05/23/2014   Procedure: LEFT AND RIGHT HEART CATHETERIZATION WITH CORONARY ANGIOGRAM;  Surgeon: [name], MD;  Location: [facility] CATH LAB;  Service: Cardiovascular;  Laterality: N/A;  . MULTIPLE EXTRACTIONS WITH ALVEOLOPLASTY N/A 02/27/2014   Procedure: Extraction of  tooth #'s 2,4,5,6,7,8,9,10,11,12, 22, 23, 24, 25, 26, 28 with alveoloplasty and bilateral mandibular tori reductions;  Surgeon: [name], DDS;  Location: [place];  Service: Oral Surgery;  Laterality: N/A;  . RIGID BRONCHOSCOPY N/A 01/14/2015   Procedure: RIGID BRONCHOSCOPY with Removal Of Foreign Body ;  Surgeon: [name], MD;  Location: [place];  Service: Thoracic;  Laterality: N/A;  . TEE WITHOUT CARDIOVERSION N/A 03/24/2015   Procedure: TRANSESOPHAGEAL ECHOCARDIOGRAM (TEE);  Surgeon: [name], MD;  Location: [place];  Service: Open Heart Surgery;  Laterality: N/A;  . TRANSCATHETER AORTIC VALVE REPLACEMENT, TRANSFEMORAL Bilateral 03/24/2015   Procedure: TRANSCATHETER AORTIC VALVE REPLACEMENT, TRANSFEMORAL;  Surgeon: [name], MD;  Location: [place];  Service: Open Heart Surgery;  Laterality: Bilateral;  . TUBAL LIGATION  1987  . VIDEO BRONCHOSCOPY Bilateral 06/05/2014   Procedure: VIDEO BRONCHOSCOPY WITHOUT FLUORO;  Surgeon: [name], MD;  Location: [facility] ENDOSCOPY;  Service: Cardiopulmonary;  Laterality: Bilateral;     OB History   No obstetric history on file.     Family History  Problem Relation Age of Onset  . Heart disease Mother   . Heart attack Mother 69  . Diabetes Mother   . Hypertension Mother   . Colon cancer Father 74  . Breast cancer Sister   . Diabetes Brother   . Hypertension Brother   . Stroke Brother   . Cancer Brother   . Congestive Heart Failure Daughter   . Hypertension Son   . High Cholesterol Son   . Congestive Heart Failure Son     Social History   Tobacco Use  . Smoking status: Never Smoker  . Smokeless tobacco: Never Used  Vaping Use  . Vaping Use: Never used  Substance Use Topics  . Alcohol use: No  . Drug use: No    Home Medications Prior to Admission medications   Medication Sig Start Date End Date Taking? Authorizing Provider  acetaminophen (TYLENOL) 500 MG tablet Take one tablet two times daily,  by mouth, for knee  pain Patient taking differently: Take 500 mg by mouth 2 (two) times daily as needed for moderate pain. Take one tablet two times daily, by mouth, for knee  pain 07/18/19  Yes Fayrene Helper, MD  allopurinol (ZYLOPRIM) 100 MG tablet Take by mouth. 11/04/20 11/04/21 Yes [provider]  aspirin EC 81 MG tablet Take 81 mg by mouth every morning.    Yes [provider]  blood glucose meter kit and supplies Dispense based on patient and insurance preference. Use up to three times daily dx E11.65 11/04/20  Yes Fayrene Helper, MD  blood glucose meter kit and supplies Dispense based on patient and insurance preference. Use to test three times daily (FOR ICD-10 E10.9, E11.9). 12/02/20  Yes Fayrene Helper, MD  carvedilol (COREG) 25 MG tablet TAKE 1 TABLET BY MOUTH TWICE DAILY WITH MEALS. 12/04/20  Yes [name],  Aloha Gell, MD  cetirizine (ZYRTEC) 10 MG tablet Take 1 tablet (10 mg total) by mouth daily. 07/29/20 09/27/20 Yes Lindell Spar, MD  cholecalciferol (VITAMIN D3) 25 MCG (1000 UNIT) tablet Take 1,000 Units by mouth daily.   Yes [provider]  diclofenac Sodium (VOLTAREN) 1 % GEL Apply 1 application topically 4 (four) times daily as needed (pain).   Yes [provider]  ezetimibe (ZETIA) 10 MG tablet TAKE ONE TABLET BY MOUTH ONCE DAILY. 10/23/20  Yes Fayrene Helper, MD  fenofibrate (TRICOR) 145 MG tablet TAKE (1) TABLET BY MOUTH ONCE DAILY. 12/21/20  Yes Fayrene Helper, MD  furosemide (LASIX) 40 MG tablet TAKE (1) TABLET BY MOUTH EACH MORNING. 12/10/20  Yes Fayrene Helper, MD  glipiZIDE (GLUCOTROL XL) 2.5 MG 24 hr tablet TAKE ONE TABLET BY MOUTH ONCE DAILY WITH BREAKFAST. 12/03/20  Yes Fayrene Helper, MD  Insulin Pen Needle (LITETOUCH PEN NEEDLES) 31G X 8 MM MISC Use to inject insulin twice daily Dx e11.65 12/09/20  Yes Fayrene Helper, MD  Lancets (ONETOUCH DELICA PLUS [id number]) [place] USE 3 TIMES A DAY AS DIRECTED.  09/25/20  Yes Fayrene Helper, MD  LIVALO 2 MG TABS TAKE ONE TABLET BY MOUTH ONCE DAILY AFTER SUPPER. 12/03/20  Yes Noreene Larsson, NP  Multiple Vitamins-Minerals (MULTIVITAMIN PO) Take 1 tablet by mouth every morning.    Yes [provider]  NOVOLOG MIX 70/30 FLEXPEN (70-30) 100 UNIT/ML FlexPen INJECT 12 TO 15 UNITS SUBCUTANEOUSLY TWICE DAILY. 12/11/20  Yes Fayrene Helper, MD  [facility] ULTRA test strip CHECK BLOOD SUGAR 3 TIMES A DAY. 10/30/20  Yes Fayrene Helper, MD  potassium chloride (KLOR-CON) 10 MEQ tablet Once daily 09/28/20  Yes Fayrene Helper, MD  spironolactone (ALDACTONE) 25 MG tablet TAKE ONE TABLET BY MOUTH ONCE DAILY. 10/23/20  Yes Fayrene Helper, MD  UNABLE TO FIND Shower bench x 1   DX S14.23 05/02/19  Yes Fayrene Helper, MD  UNABLE TO FIND Rolling walker with seat x 1  DX m17.12 05/02/19  Yes Fayrene Helper, MD  ferrous sulfate 325 (65 FE) MG EC tablet Take 1 tablet (325 mg total) by mouth 2 (two) times daily. 07/14/11 10/28/11  Fayrene Helper, MD  FLUoxetine (PROZAC) 10 MG tablet Take 1 tablet (10 mg total) by mouth daily. Take one capsule by mouth once a day 12/20/10 11/03/11  Fayrene Helper, MD    Allergies    Crestor [rosuvastatin], Tramadol, and Penicillins  Review of Systems   Review of Systems  Respiratory: Positive for shortness of breath.   Cardiovascular: Positive for chest pain.  All other systems reviewed and are negative.   Physical Exam Updated Vital Signs BP 104/68   Pulse 71   Temp 99.1 F (37.3 C) (Oral)   Resp 16   Ht _0  (1.651 m)   Wt 79.4 kg   SpO2 99%   BMI 29.12 kg/m   Physical Exam Vitals and nursing note reviewed.  Constitutional:      Appearance: She is well-developed. She is obese.  HENT:     Head: Normocephalic and atraumatic.  Eyes:     Extraocular Movements: Extraocular movements intact.     Pupils: Pupils are equal, round, and reactive to light.  Cardiovascular:     Rate and  Rhythm: Normal rate and regular rhythm.     Heart sounds: Normal heart sounds.  Pulmonary:     Effort: Pulmonary effort is  normal.     Breath sounds: Normal breath sounds.  Abdominal:     General: Bowel sounds are normal.     Palpations: Abdomen is soft.  Musculoskeletal:        General: Normal range of motion.     Cervical back: Normal range of motion and neck supple.  Skin:    General: Skin is warm.     Capillary Refill: Capillary refill takes less than 2 seconds.  Neurological:     General: No focal deficit present.     Mental Status: She is alert and oriented to person, place, and time.  Psychiatric:        Mood and Affect: Mood normal.        Behavior: Behavior normal.     ED Results / Procedures / Treatments   Labs (all labs ordered are listed, but only abnormal results are displayed) Labs Reviewed  BASIC METABOLIC PANEL - Abnormal; Notable for the following components:      Result Value   Glucose, Bld 186 (*)    BUN 40 (*)    Creatinine, Ser 1.69 (*)    GFR, Estimated 31 (*)    All other components within normal limits  CBC - Abnormal; Notable for the following components:   Hemoglobin 11.9 (*)    RDW 16.7 (*)    All other components within normal limits  BRAIN NATRIURETIC PEPTIDE - Abnormal; Notable for the following components:   B Natriuretic Peptide 336.0 (*)    All other components within normal limits  TROPONIN I (HIGH SENSITIVITY) - Abnormal; Notable for the following components:   Troponin I (High Sensitivity) 34 (*)    All other components within normal limits  TROPONIN I (HIGH SENSITIVITY) - Abnormal; Notable for the following components:   Troponin I (High Sensitivity) 30 (*)    All other components within normal limits  URINALYSIS, ROUTINE W REFLEX MICROSCOPIC    EKG EKG Interpretation  Date/Time:  Tuesday Dec 22 2020 07:10:23 EDT Ventricular Rate:  71 PR Interval:  122 QRS Duration: 162 QT Interval:  458 QTC Calculation: 498 R  Axis:   4 Text Interpretation: atrial sensed ventricular paced rhythm No significant change since last tracing Confirmed by Isla Pence [phone]) on 12/22/2020 7:25:14 AM   Radiology DG Chest Port 1 View  Result Date: 12/22/2020 CLINICAL DATA:  Chest pain and shortness of breath. EXAM: PORTABLE CHEST 1 VIEW COMPARISON:  06/29/2020 FINDINGS: The pacer wires are stable. The heart is mildly enlarged but unchanged. Stable surgical changes from a TAVR. Stable tortuosity and ectasia of the thoracic aorta. Chronic pulmonary scarring changes. No definite acute overlying pulmonary process. No pleural effusions. IMPRESSION: Chronic lung changes but no acute pulmonary findings. Electronically Signed   By: Marijo Sanes M.D.   On: 12/22/2020 08:10   ECHOCARDIOGRAM COMPLETE  Result Date: 12/21/2020    ECHOCARDIOGRAM REPORT   Patient Name:   [name] [name] Date of Exam: 12/21/2020 Medical Rec #:  [id number]       Height:       65.0 in Accession #:    [id number]      Weight:       175.0 lb Date of Birth:  Jul 02, 1946       BSA:          1.869 m Patient Age:    74 years        BP:           118/70 mmHg Patient Gender: F  HR:           83 bpm. Exam Location:  [facility] Procedure: 2D Echo, Cardiac Doppler and Color Doppler Indications:    Z95.2 (ICD-10-CM) - S/P TAVR (transcatheter aortic valve                 replacement)  History:        Patient has prior history of Echocardiogram examinations, most                 recent 01/02/2020. Cardiomyopathy and CHF, Arrythmias:LBBB; Risk                 Factors:Hypertension, Diabetes and Dyslipidemia. S/P TAVR                 (transcatheter aortic valve replacement), Biventricular ICD                 (implantable cardioverter-defibrillator) in place (From Hx).                 COVID-19 virus infection (Resolved).                 Aortic Valve: 23 mm Sapien prosthetic, stented (TAVR) valve is                 present in the aortic position.  Sonographer:    Alvino Chapel  RCS Referring Phys: Joaquin  1. Left ventricular ejection fraction, by estimation, is 15-20%. The left ventricle has severely decreased function. The left ventricle demonstrates global hypokinesis. The left ventricular internal cavity size was mildly dilated. There is moderate asymmetric left ventricular hypertrophy. Left ventricular diastolic parameters are indeterminate.  2. Right ventricular systolic function is normal. The right ventricular size is normal. There is normal pulmonary artery systolic pressure. The estimated right ventricular systolic pressure is 32.2 mmHg.  3. Left atrial size was moderately dilated.  4. The mitral valve is abnormal. Mild mitral valve regurgitation.  5. The aortic valve has been repaired/replaced. Aortic valve regurgitation is not visualized. There is a 23 mm Sapien prosthetic (TAVR) valve present in the aortic position. Aortic valve mean gradient measures 13.5 mmHg. No paravalvular regurgitation.  6. The inferior vena cava is normal in size with greater than 50% respiratory variability, suggesting right atrial pressure of 3 mmHg. FINDINGS  Left Ventricle: Left ventricular ejection fraction, by estimation, is 15-20%. The left ventricle has severely decreased function. The left ventricle demonstrates global hypokinesis. The left ventricular internal cavity size was mildly dilated. There is moderate asymmetric left ventricular hypertrophy. Abnormal (paradoxical) septal motion, consistent with RV pacemaker. Left ventricular diastolic parameters are indeterminate. Right Ventricle: The right ventricular size is normal. No increase in right ventricular wall thickness. Right ventricular systolic function is normal. There is normal pulmonary artery systolic pressure. The tricuspid regurgitant velocity is 2.75 m/s, and  with an assumed right atrial pressure of 3 mmHg, the estimated right ventricular systolic pressure is 02.5 mmHg. Left Atrium: Left atrial size was  moderately dilated. Right Atrium: Right atrial size was normal in size. Pericardium: The pericardium was not assessed. Mitral Valve: The mitral valve is abnormal. There is mild calcification of the mitral valve leaflet(s). Mild to moderate mitral annular calcification. Mild mitral valve regurgitation. Tricuspid Valve: The tricuspid valve is grossly normal. Tricuspid valve regurgitation is trivial. Aortic Valve: The aortic valve has been repaired/replaced. Aortic valve regurgitation is not visualized. Aortic valve mean gradient measures 13.5 mmHg. Aortic valve peak gradient measures 27.2 mmHg.  Aortic valve area, by VTI measures 0.46 cm. There is a  23 mm Sapien prosthetic, stented (TAVR) valve present in the aortic position. Pulmonic Valve: The pulmonic valve was not well visualized. Pulmonic valve regurgitation is trivial. Aorta: The aortic root is normal in size and structure. Venous: The inferior vena cava is normal in size with greater than 50% respiratory variability, suggesting right atrial pressure of 3 mmHg. IAS/Shunts: No atrial level shunt detected by color flow Doppler. Additional Comments: A device lead is visualized.  LEFT VENTRICLE PLAX 2D LVIDd:         6.00 cm LVIDs:         5.70 cm LV PW:         1.00 cm LV IVS:        1.40 cm LVOT diam:     1.70 cm LV SV:         25 LV SV Index:   13 LVOT Area:     2.27 cm  LV Volumes (MOD) LV vol d, MOD A2C: 232.0 ml LV vol d, MOD A4C: 234.0 ml LV vol s, MOD A2C: 166.0 ml LV vol s, MOD A4C: 197.0 ml LV SV MOD A2C:     66.0 ml LV SV MOD A4C:     234.0 ml LV SV MOD BP:      60.0 ml RIGHT VENTRICLE RV S prime:     12.70 cm/s TAPSE (M-mode): 2.5 cm LEFT ATRIUM              Index       RIGHT ATRIUM           Index LA diam:        3.90 cm  2.09 cm/m  RA Area:     14.80 cm LA Vol (A2C):   100.5 ml 53.77 ml/m RA Volume:   36.70 ml  19.64 ml/m LA Vol (A4C):   62.2 ml  33.28 ml/m LA Biplane Vol: 79.4 ml  42.48 ml/m  AORTIC VALVE AV Area (Vmax):    0.45 cm AV Area  (Vmean):   0.52 cm AV Area (VTI):     0.46 cm AV Vmax:           261.00 cm/s AV Vmean:          166.500 cm/s AV VTI:            0.532 m AV Peak Grad:      27.2 mmHg AV Mean Grad:      13.5 mmHg LVOT Vmax:         52.20 cm/s LVOT Vmean:        38.000 cm/s LVOT VTI:          0.108 m LVOT/AV VTI ratio: 0.20  AORTA Ao Root diam: 2.60 cm MITRAL VALVE                TRICUSPID VALVE MV Area (PHT): 4.80 cm     TR Peak grad:   30.2 mmHg MV Decel Time: 158 msec     TR Vmax:        275.00 cm/s MR Peak grad: 115.8 mmHg MR Mean grad: 57.0 mmHg     SHUNTS MR Vmax:      538.00 cm/s   Systemic VTI:  0.11 m MR Vmean:     332.0 cm/s    Systemic Diam: 1.70 cm MV E velocity: 150.00 cm/s Rozann Lesches MD Electronically signed by Rozann Lesches MD Signature Date/Time: 12/21/2020/11:49:21 AM  Final     Procedures Procedures   Medications Ordered in ED Medications  acetaminophen (TYLENOL) tablet 650 mg (650 mg Oral Given 12/22/20 0730)    ED Course  I have reviewed the triage vital signs and the nursing notes.  Pertinent labs & imaging results that were available during my care of the patient were reviewed by me and considered in my medical decision making (see chart for details).    MDM Rules/Calculators/A&P                          Pt's CP is pleuritic.  I don't think it is cardiac in nature.  I explained her ECHO results.  I reviewed her chart with cardiology.  He does not wish to change any meds right now.  She has f/u with Dr. [name] on 5/12.    Final Clinical Impression(s) / ED Diagnoses Final diagnoses:  Atypical chest pain    Rx / DC Orders ED Discharge Orders    None       Isla Pence, MD 12/22/20 1007

## 2020-12-24 ENCOUNTER — Other Ambulatory Visit: Payer: Self-pay

## 2020-12-24 ENCOUNTER — Encounter: Payer: Self-pay | Admitting: Cardiology

## 2020-12-24 ENCOUNTER — Ambulatory Visit: Payer: PPO | Admitting: Cardiology

## 2020-12-24 VITALS — BP 122/70 | HR 76 | Ht 65.5 in | Wt 172.1 lb

## 2020-12-24 DIAGNOSIS — I493 Ventricular premature depolarization: Secondary | ICD-10-CM

## 2020-12-24 DIAGNOSIS — Z79899 Other long term (current) drug therapy: Secondary | ICD-10-CM

## 2020-12-24 DIAGNOSIS — N1832 Chronic kidney disease, stage 3b: Secondary | ICD-10-CM

## 2020-12-24 DIAGNOSIS — I428 Other cardiomyopathies: Secondary | ICD-10-CM | POA: Diagnosis not present

## 2020-12-24 DIAGNOSIS — Z952 Presence of prosthetic heart valve: Secondary | ICD-10-CM | POA: Diagnosis not present

## 2020-12-24 MED ORDER — ENTRESTO 24-26 MG PO TABS: 1.0000 | ORAL_TABLET | Freq: Two times a day (BID) | ORAL | 6 refills | Status: DC

## 2020-12-24 MED ORDER — SPIRONOLACTONE 25 MG PO TABS: 12.5000 mg | ORAL_TABLET | Freq: Every day | ORAL | 3 refills | Status: DC

## 2020-12-24 NOTE — Patient Instructions (Signed)
Medication Instructions:  DECREASE Spironolactone to 12.5 mg daily     START Entresto 24/26 mg 1 tablet twice a day    *If you need a refill on your cardiac medications before your next appointment, please call your pharmacy*   Lab Work: BMET in 7-10 days   (5/19-5/23)   If you have labs (blood work) drawn today and your tests are completely normal, you will receive your results only by: Marland Kitchen MyChart Message (if you have MyChart) OR . A paper copy in the mail If you have any lab test that is abnormal or we need to change your treatment, we will call you to review the results.   Testing/Procedures: None today   Follow-Up: At [facility], you and your health needs are our priority.  As part of our continuing mission to provide you with exceptional heart care, we have created designated Provider Care Teams.  These Care Teams include your primary Cardiologist (physician) and Advanced Practice Providers (APPs -  Physician Assistants and Nurse Practitioners) who all work together to provide you with the care you need, when you need it.  We recommend signing up for the patient portal called "MyChart".  Sign up information is provided on this After Visit Summary.  MyChart is used to connect with patients for Virtual Visits (Telemedicine).  Patients are able to view lab/test results, encounter notes, upcoming appointments, etc.  Non-urgent messages can be sent to your provider as well.   To learn more about what you can do with MyChart, go to [email or web address].    Your next appointment:   4- 6 week(s)  The format for your next appointment:   In Person  Provider:   Bernerd Pho, PA-C   Other Instructions None

## 2020-12-24 NOTE — Progress Notes (Signed)
Cardiology Office Note  Date: 12/24/2020   ID: [name] [name], [name] [name] 1946-05-03, MRN [id number]  PCP:  Fayrene Helper, MD  Cardiologist:  Rozann Lesches, MD Electrophysiologist:  None   Chief Complaint  Patient presents with  . Cardiac follow-up    History of Present Illness: [name] [name] is a 75 y.o. female last seen in January.  She presents to the office for follow-up.  She is here today with her son.   I see that she was just evaluated in the ER on May 10.  Note mentions chest discomfort, but she is careful to tell me today that she felt more of a soreness in her left abdomen, has also been having some trouble with bursitis in her left arm.  High-sensitivity troponin I levels were not suggestive of ACS, chest x-ray showed no acute findings.  BNP 336 and creatinine 1.69 down from 1.80.  I talked with her today about her recent follow-up echocardiogram.  Unfortunately, she has not shown any significant improvement in LVEF, most recently graded at 15 to 20%.  TAVR prosthesis functioning normally however.  We went over her medications today and I talked with her about considering some adjustments.  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  She sees [name], [employer]tronic CRT-D in place.  Device check in March indicated normal function with frequent PVCs and NSVT.  She will be seeing [name] in early June.  She reports no device shocks or syncope.  Past Medical History:  Diagnosis Date  . Acute hypoxemic respiratory failure due to COVID-19 ([place]) 06/29/2020  . Anxiety   . Aortic stenosis   . Arthritis   . Biventricular ICD (implantable cardioverter-defibrillator) in place    March 2021, [employer]tr[employer]  -[name]  . Chronic combined systolic and diastolic CHF (congestive heart failure) ([place])   . CKD (chronic kidney disease) stage 3, GFR 30-59 ml/min (HCC)   . Constipation   . COVID-19 virus infection 07/16/2020  . Depression   . Diabetes mellitus, type 2  ([place])   . Essential hypertension   . Hyperlipidemia   . Incidental pulmonary nodule, > 66m and < 859m   7 x 4 mm LLL nodule  . LBBB (left bundle branch block)   . Nonischemic cardiomyopathy (HCC)    No significant obstructive CAD at heart catheterization 05/2014, LVEF 20%  . Obesity   . Pneumonia 2013  . 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 Symptomatic PVCs     Past Surgical History:  Procedure Laterality Date  . BIV ICD INSERTION CRT-D N/A 10/28/2019   Procedure: BIV ICD INSERTION CRT-D;  Surgeon: TaEvans LanceMD;  [facility];  Service: Cardiovascular;  Laterality: N/A;  . CARDIAC CATHETERIZATION    . CESAREAN SECTION  1987  . FLEXIBLE BRONCHOSCOPY N/A 01/14/2015   Procedure: FLEXIBLE BRONCHOSCOPY;  Surgeon: StMelrose NakayamaMD;  [facility] Service: Thoracic;  Laterality: N/A;  . LEFT AND RIGHT HEART CATHETERIZATION WITH CORONARY ANGIOGRAM N/A 12/05/2011   Procedure: LEFT AND RIGHT HEART CATHETERIZATION WITH CORONARY ANGIOGRAM;  Surgeon: ChBurnell BlanksMD;  [facility];  Service: Cardiovascular;  Laterality: N/A;  . LEFT AND RIGHT HEART CATHETERIZATION WITH CORONARY ANGIOGRAM N/A 05/23/2014   Procedure: LEFT AND RIGHT HEART CATHETERIZATION WITH CORONARY ANGIOGRAM;  Surgeon: ChBurnell Blanks  MD;  Location: [facility];  Service: Cardiovascular;  Laterality: N/A;  . MULTIPLE EXTRACTIONS WITH ALVEOLOPLASTY N/A 02/27/2014   Procedure: Extraction of tooth #'s 2,4,5,6,7,8,9,10,11,12, 22, 23, 24, 25, 26, 28 with alveoloplasty and bilateral mandibular tori reductions;  Surgeon: [name], DDS;  Location: [facility];  Service: Oral Surgery;  Laterality: N/A;  . RIGID BRONCHOSCOPY N/A 01/14/2015   Procedure: RIGID BRONCHOSCOPY with Removal Of Foreign Body ;  Surgeon: [name], MD;  Location: [facility];  Service:  Thoracic;  Laterality: N/A;  . TEE WITHOUT CARDIOVERSION N/A 03/24/2015   Procedure: TRANSESOPHAGEAL ECHOCARDIOGRAM (TEE);  Surgeon: [name], MD;  Location: [facility];  Service: Open Heart Surgery;  Laterality: N/A;  . TRANSCATHETER AORTIC VALVE REPLACEMENT, TRANSFEMORAL Bilateral 03/24/2015   Procedure: TRANSCATHETER AORTIC VALVE REPLACEMENT, TRANSFEMORAL;  Surgeon: [name], MD;  Location: [facility];  Service: Open Heart Surgery;  Laterality: Bilateral;  . TUBAL LIGATION  1987  . VIDEO BRONCHOSCOPY Bilateral 06/05/2014   Procedure: VIDEO BRONCHOSCOPY WITHOUT FLUORO;  Surgeon: [name], MD;  Location[facility] ENDOSCOPY;  Service: Cardiopulmonary;  Laterality: Bilateral;    Current Outpatient Medications  Medication Sig Dispense Refill  . acetaminophen (TYLENOL) 500 MG tablet Take one tablet two times daily, by mouth, for knee  pain (Patient taking differently: Take 500 mg by mouth 2 (two) times daily as needed for moderate pain. Take one tablet two times daily, by mouth, for knee  pain) 60 tablet 3  . allopurinol (ZYLOPRIM) 100 MG tablet Take by mouth.    Marland Kitchen aspirin EC 81 MG tablet Take 81 mg by mouth every morning.     . blood glucose meter kit and supplies Dispense based on patient and insurance preference. Use up to three times daily dx E11.65 1 each 0  . blood glucose meter kit and supplies Dispense based on patient and insurance preference. Use to test three times daily (FOR ICD-10 E10.9, E11.9). 1 each 0  . carvedilol (COREG) 25 MG tablet TAKE 1 TABLET BY MOUTH TWICE DAILY WITH MEALS. 60 tablet 6  . cetirizine (ZYRTEC) 10 MG tablet Take 1 tablet (10 mg total) by mouth daily. 30 tablet 1  . cholecalciferol (VITAMIN D3) 25 MCG (1000 UNIT) tablet Take 1,000 Units by mouth daily.    . diclofenac Sodium (VOLTAREN) 1 % GEL Apply 1 application topically 4 (four) times daily as needed (pain).    Marland Kitchen ezetimibe (ZETIA) 10 MG tablet TAKE ONE TABLET BY MOUTH ONCE DAILY. 90 tablet 0   . fenofibrate (TRICOR) 145 MG tablet TAKE (1) TABLET BY MOUTH ONCE DAILY. 30 tablet 0  . furosemide (LASIX) 40 MG tablet TAKE (1) TABLET BY MOUTH EACH MORNING. 30 tablet 0  . glipiZIDE (GLUCOTROL XL) 2.5 MG 24 hr tablet TAKE ONE TABLET BY MOUTH ONCE DAILY WITH BREAKFAST. 30 tablet 0  . Insulin Pen Needle (LITETOUCH PEN NEEDLES) 31G X 8 MM MISC Use to inject insulin twice daily Dx e11.65 100 each 5  . Lancets (ONETOUCH DELICA PLUS SPQZRA07M) MISC USE 3 TIMES A DAY AS DIRECTED. 100 each 0  . LIVALO 2 MG TABS TAKE ONE TABLET BY MOUTH ONCE DAILY AFTER SUPPER. 90 tablet 0  . Multiple Vitamins-Minerals (MULTIVITAMIN PO) Take 1 tablet by mouth every morning.     Marland Kitchen NOVOLOG MIX 70/30 FLEXPEN (70-30) 100 UNIT/ML FlexPen INJECT 12 TO 15 UNITS SUBCUTANEOUSLY TWICE DAILY. 15 mL 0  . ONETOUCH ULTRA test strip CHECK BLOOD SUGAR 3 TIMES A DAY. 100  strip 0  . potassium chloride (KLOR-CON) 10 MEQ tablet Once daily 30 tablet 6  . sacubitril-valsartan (ENTRESTO) 24-26 MG Take 1 tablet by mouth 2 (two) times daily. 60 tablet 6  . spironolactone (ALDACTONE) 25 MG tablet Take 0.5 tablets (12.5 mg total) by mouth daily. 45 tablet 3  . UNABLE TO FIND Shower bench x 1   DX M17.12 1 each 0  . UNABLE TO FIND Rolling walker with seat x 1  DX m17.12 1 each 0   No current facility-administered medications for this visit.   Allergies:  Crestor [rosuvastatin], Tramadol, and Penicillins   Social History: The patient  reports that she has never smoked. She has never used smokeless tobacco. She reports that she does not drink alcohol and does not use drugs.   Family History: The patient's family history includes Breast cancer in her sister; Cancer in her brother; Colon cancer (age of onset: 12) in her father; Congestive Heart Failure in her daughter and son; Diabetes in her brother and mother; Heart attack (age of onset: 52) in her mother; Heart disease in her mother; High Cholesterol in her son; Hypertension in her brother,  mother, and son; Stroke in her brother.   ROS: No palpitations or syncope.  Physical Exam: VS:  BP 122/70   Pulse 76   Ht 5' 5.5" (1.664 m)   Wt 172 lb 1.6 oz (78.1 kg)   SpO2 96%   BMI 28.20 kg/m , BMI Body mass index is 28.2 kg/m.  Wt Readings from Last 3 Encounters:  12/24/20 172 lb 1.6 oz (78.1 kg)  12/22/20 175 lb (79.4 kg)  12/12/20 175 lb (79.4 kg)    General: Patient appears comfortable at rest. HEENT: Conjunctiva and lids normal, wearing a mask. Neck: Supple, no elevated JVP or carotid bruits, no thyromegaly. Lungs: Clear to auscultation, nonlabored breathing at rest. Cardiac: Regular rate and rhythm, no S3, 2/6 systolic murmur, no pericardial rub. Abdomen: Soft, nontender, bowel sounds present. Extremities: No pitting edema, distal pulses 2+. Skin: Warm and dry. Musculoskeletal: No kyphosis. Neuropsychiatric: Alert and oriented x3, affect grossly appropriate.  ECG:  An ECG dated 12/22/2020 was personally reviewed today and demonstrated:  Sinus rhythm with PVC, left atrial enlargement, IVCD.  Recent Labwork: 07/04/2020: Magnesium 2.7 10/27/2020: ALT 15; AST 22; TSH 3.210 12/22/2020: B Natriuretic Peptide 336.0; BUN 40; Creatinine, Ser 1.69; Hemoglobin 11.9; Platelets 302; Potassium 3.8; Sodium 137     Component Value Date/Time   CHOL 171 10/27/2020 1047   TRIG 106 10/27/2020 1047   HDL 45 10/27/2020 1047   CHOLHDL 3.8 10/27/2020 1047   CHOLHDL 2.9 08/20/2019 1001   VLDL 21 02/06/2017 0916   LDLCALC 107 (H) 10/27/2020 1047   LDLCALC 94 08/20/2019 1001    Other Studies Reviewed Today:  Echocardiogram 12/21/2020: 1. Left ventricular ejection fraction, by estimation, is 15-20%. The left  ventricle has severely decreased function. The left ventricle demonstrates  global hypokinesis. The left ventricular internal cavity size was mildly  dilated. There is moderate  asymmetric left ventricular hypertrophy. Left ventricular diastolic  parameters are indeterminate.   2. Right ventricular systolic function is normal. The right ventricular  size is normal. There is normal pulmonary artery systolic pressure. The  estimated right ventricular systolic pressure is 54.6 mmHg.  3. Left atrial size was moderately dilated.  4. The mitral valve is abnormal. Mild mitral valve regurgitation.  5. The aortic valve has been repaired/replaced. Aortic valve  regurgitation is not visualized. There is a 23  mm Sapien prosthetic (TAVR)  valve present in the aortic position. Aortic valve mean gradient measures  13.5 mmHg. No paravalvular regurgitation.  6. The inferior vena cava is normal in size with greater than 50%  respiratory variability, suggesting right atrial pressure of 3 mmHg.   Chest x-ray 12/22/2020: FINDINGS: The pacer wires are stable.  The heart is mildly enlarged but unchanged. Stable surgical changes from a TAVR.  Stable tortuosity and ectasia of the thoracic aorta.  Chronic pulmonary scarring changes. No definite acute overlying pulmonary process. No pleural effusions.  IMPRESSION: Chronic lung changes but no acute pulmonary findings.  Assessment and Plan:  1.  Nonischemic cardiomyopathy, LVEF recently 15 to 20% range with global hypokinesis, RV contraction normal.  Plan to continue Coreg and Lasix with potassium supplement at current doses.  Reduce Aldactone to 12.5 mg daily and attempt Entresto 24/26 mg twice daily. She will need to have close follow-up of renal function and potassium, follow-up BMET in 7 to 10 days from starting.  Clinic follow-up arranged.  2.  Aortic stenosis status post TAVR in 2016.  Valve function stable with mean gradient 13.5 mmHg by recent echocardiogram.  3.  CKD stage IIIb, continues to follow with nephrology.  Recent creatinine down to 1.69 with normal potassium.  4.  Medtronic CRT-D in place.  Keep follow-up with [name].  She does not report any device shocks or syncope.  If she is having frequent PVCs  and NSVT, possibility of PVC related cardiomyopathy could be considered.  May need further monitoring to determine PVC frequency and whether antiarrhythmic therapy or ablation would be considered.  She has follow-up already scheduled with [name].  Medication Adjustments/Labs and Tests Ordered: Current medicines are reviewed at length with the patient today.  Concerns regarding medicines are outlined above.   Tests Ordered: Orders Placed This Encounter  Procedures  . Basic metabolic panel    Medication Changes: Meds ordered this encounter  Medications  . spironolactone (ALDACTONE) 25 MG tablet    Sig: Take 0.5 tablets (12.5 mg total) by mouth daily.    Dispense:  45 tablet    Refill:  3    12/24/20 dose decreased to 12.5 mg daily  . sacubitril-valsartan (ENTRESTO) 24-26 MG    Sig: Take 1 tablet by mouth 2 (two) times daily.    Dispense:  60 tablet    Refill:  6    Please Honor Card patient is presenting for Carmie Kanner: [id number]; Juanna Cao: [id number]; YVOPF: OHS; YTWK: [phone]    Disposition:  Follow up 4 to 6 weeks.  Signed, [name], MD, [facility] 12/24/2020 2:03 PM    [facility] at [facility] [address] [place], [place] [address] Phone: [phone]; Fax: [phone]

## 2020-12-28 ENCOUNTER — Encounter: Payer: Self-pay | Admitting: Orthopedic Surgery

## 2020-12-28 ENCOUNTER — Ambulatory Visit: Payer: PPO | Admitting: Orthopedic Surgery

## 2020-12-28 ENCOUNTER — Other Ambulatory Visit: Payer: Self-pay

## 2020-12-28 VITALS — BP 113/65 | HR 83 | Resp 16 | Ht 66.0 in | Wt 177.2 lb

## 2020-12-28 DIAGNOSIS — M7022 Olecranon bursitis, left elbow: Secondary | ICD-10-CM

## 2020-12-28 NOTE — Progress Notes (Signed)
Chief Complaint  Patient presents with  . Bursitis    Left elbow   75 year old female with chronic pain left elbow.  Patient went to urgent care they put her on prednisone but the prednisone elevated her sugar.  She then went to [facility] to be evaluated and was told she had a bursitis of her left elbow  Today her elbow pain is better her swelling is down  She complains of pain over the left elbow at the olecranon it radiates proximally and distally  She would also like advice regarding her allopurinol and her colchicine  She does have some chronic renal failure  I told her to take the allopurinol daily and to take the colchicine when she gets a gout attack  As far as the left elbow go she has a thickened olecranon bursa without fluid she has full range of motion including full range of motion of the shoulder  The elbow is stable she is neurovascular intact in the left upper extremity  I told her she can see [place] if the elbow swells or becomes painful or stiff again  Encounter Diagnosis  Name Primary?  Marland Kitchen Olecranon bursitis, left elbow Yes

## 2020-12-28 NOTE — Patient Instructions (Signed)
Ok to take Ibuprofen as needed for a couple days, but don't use it every day.

## 2020-12-29 ENCOUNTER — Other Ambulatory Visit: Payer: Self-pay | Admitting: Family Medicine

## 2020-12-30 ENCOUNTER — Other Ambulatory Visit (HOSPITAL_COMMUNITY): Payer: PPO

## 2021-01-04 ENCOUNTER — Other Ambulatory Visit: Payer: Self-pay

## 2021-01-04 ENCOUNTER — Other Ambulatory Visit (HOSPITAL_COMMUNITY)
Admission: RE | Admit: 2021-01-04 | Discharge: 2021-01-04 | Disposition: A | Discharge status: Home / Self Care | Payer: PPO | Source: Ambulatory Visit | Attending: Cardiology | Admitting: Cardiology

## 2021-01-04 DIAGNOSIS — Z79899 Other long term (current) drug therapy: Secondary | ICD-10-CM | POA: Insufficient documentation

## 2021-01-04 DIAGNOSIS — I428 Other cardiomyopathies: Secondary | ICD-10-CM | POA: Diagnosis not present

## 2021-01-04 LAB — BASIC METABOLIC PANEL
Anion gap: 7 (ref 5–15)
BUN: 40 mg/dL — ABNORMAL HIGH (ref 8–23)
CO2: 25 mmol/L (ref 22–32)
Calcium: 9.2 mg/dL (ref 8.9–10.3)
Chloride: 107 mmol/L (ref 98–111)
Creatinine, Ser: 1.87 mg/dL — ABNORMAL HIGH (ref 0.44–1.00)
GFR, Estimated: 28 mL/min — ABNORMAL LOW (ref >60)
Glucose, Bld: 143 mg/dL — ABNORMAL HIGH (ref 70–99)
Potassium: 3.8 mmol/L (ref 3.5–5.1)
Sodium: 139 mmol/L (ref 135–145)

## 2021-01-05 ENCOUNTER — Other Ambulatory Visit: Payer: Self-pay | Admitting: Family Medicine

## 2021-01-12 DIAGNOSIS — Z79899 Other long term (current) drug therapy: Secondary | ICD-10-CM | POA: Diagnosis not present

## 2021-01-12 DIAGNOSIS — E611 Iron deficiency: Secondary | ICD-10-CM | POA: Diagnosis not present

## 2021-01-12 DIAGNOSIS — N189 Chronic kidney disease, unspecified: Secondary | ICD-10-CM | POA: Diagnosis not present

## 2021-01-12 DIAGNOSIS — M1000 Idiopathic gout, unspecified site: Secondary | ICD-10-CM | POA: Diagnosis not present

## 2021-01-12 DIAGNOSIS — D631 Anemia in chronic kidney disease: Secondary | ICD-10-CM | POA: Diagnosis not present

## 2021-01-12 DIAGNOSIS — I5042 Chronic combined systolic (congestive) and diastolic (congestive) heart failure: Secondary | ICD-10-CM | POA: Diagnosis not present

## 2021-01-12 DIAGNOSIS — I129 Hypertensive chronic kidney disease with stage 1 through stage 4 chronic kidney disease, or unspecified chronic kidney disease: Secondary | ICD-10-CM | POA: Diagnosis not present

## 2021-01-12 DIAGNOSIS — E1122 Type 2 diabetes mellitus with diabetic chronic kidney disease: Secondary | ICD-10-CM | POA: Diagnosis not present

## 2021-01-12 DIAGNOSIS — R809 Proteinuria, unspecified: Secondary | ICD-10-CM | POA: Diagnosis not present

## 2021-01-13 ENCOUNTER — Encounter: Payer: Self-pay | Admitting: Internal Medicine

## 2021-01-13 ENCOUNTER — Other Ambulatory Visit: Payer: Self-pay

## 2021-01-13 ENCOUNTER — Ambulatory Visit: Payer: PPO | Admitting: Internal Medicine

## 2021-01-13 VITALS — BP 116/64 | HR 62 | Ht 65.0 in | Wt 181.0 lb

## 2021-01-13 DIAGNOSIS — I428 Other cardiomyopathies: Secondary | ICD-10-CM

## 2021-01-13 DIAGNOSIS — Z95810 Presence of automatic (implantable) cardiac defibrillator: Secondary | ICD-10-CM | POA: Diagnosis not present

## 2021-01-13 DIAGNOSIS — I447 Left bundle-branch block, unspecified: Secondary | ICD-10-CM

## 2021-01-13 NOTE — Patient Instructions (Signed)
Medication Instructions:  Your physician recommends that you continue on your current medications as directed. Please refer to the Current Medication list given to you today.  *If you need a refill on your cardiac medications before your next appointment, please call your pharmacy*   Lab Work: None If you have labs (blood work) drawn today and your tests are completely normal, you will receive your results only by: Marland Kitchen MyChart Message (if you have MyChart) OR . A paper copy in the mail If you have any lab test that is abnormal or we need to change your treatment, we will call you to review the results.   Testing/Procedures: None   Follow-Up: At [facility], you and your health needs are our priority.  As part of our continuing mission to provide you with exceptional heart care, we have created designated Provider Care Teams.  These Care Teams include your primary Cardiologist (physician) and Advanced Practice Providers (APPs -  Physician Assistants and Nurse Practitioners) who all work together to provide you with the care you need, when you need it.  We recommend signing up for the patient portal called "MyChart".  Sign up information is provided on this After Visit Summary.  MyChart is used to connect with patients for Virtual Visits (Telemedicine).  Patients are able to view lab/test results, encounter notes, upcoming appointments, etc.  Non-urgent messages can be sent to your provider as well.   To learn more about what you can do with MyChart, go to [email or web address].    Your next appointment:   12 month(s)  The format for your next appointment:   In Person  Provider:   Cristopher Peru, MD   Other Instructions

## 2021-01-13 NOTE — Progress Notes (Signed)
HPI [name] returns today for followup. She is a pleasant 75 yo woman with chronic systolic heart failure and LBBB, s/p Biv ICD insertion. She feels well except for some arthritis. She denies chest pain or sob. No edema. Since her ICD was placed, she has not had syncope or any ICD therapies. She was seen in the ED with atypical chest pain several weeks ago but this has resolved.  Allergies  Allergen Reactions  . Crestor [Rosuvastatin] Other (See Comments)    Muscle aches and elevated CK  . Tramadol Nausea Only    States blood sugar elevated also  . Penicillins Rash and Other (See Comments)    Family unsure of reaction, but certain mom said she was allergic Did it involve swelling of the face/tongue/throat, SOB, or low BP? No Did it involve sudden or severe rash/hives, skin peeling, or any reaction on the inside of your mouth or nose? Yes Did you need to seek medical attention at a hospital or doctor's office? Yes When did it last happen?10 + years If all above answers are "NO", may proceed with cephalosporin use.      Current Outpatient Medications  Medication Sig Dispense Refill  . acetaminophen (TYLENOL) 500 MG tablet Take one tablet two times daily, by mouth, for knee  pain (Patient taking differently: Take 500 mg by mouth 2 (two) times daily as needed for moderate pain. Take one tablet two times daily, by mouth, for knee  pain) 60 tablet 3  . allopurinol (ZYLOPRIM) 100 MG tablet Take by mouth.    Marland Kitchen aspirin EC 81 MG tablet Take 81 mg by mouth every morning.     . blood glucose meter kit and supplies Dispense based on patient and insurance preference. Use up to three times daily dx E11.65 1 each 0  . blood glucose meter kit and supplies Dispense based on patient and insurance preference. Use to test three times daily (FOR ICD-10 E10.9, E11.9). 1 each 0  . carvedilol (COREG) 25 MG tablet TAKE 1 TABLET BY MOUTH TWICE DAILY WITH MEALS. 60 tablet 6  . cetirizine  (ZYRTEC) 10 MG tablet Take 1 tablet (10 mg total) by mouth daily. 30 tablet 1  . cholecalciferol (VITAMIN D3) 25 MCG (1000 UNIT) tablet Take 1,000 Units by mouth daily.    . diclofenac Sodium (VOLTAREN) 1 % GEL Apply 1 application topically 4 (four) times daily as needed (pain).    Marland Kitchen ezetimibe (ZETIA) 10 MG tablet TAKE ONE TABLET BY MOUTH ONCE DAILY. 90 tablet 0  . fenofibrate (TRICOR) 145 MG tablet TAKE (1) TABLET BY MOUTH ONCE DAILY. 30 tablet 0  . furosemide (LASIX) 40 MG tablet TAKE (1) TABLET BY MOUTH EACH MORNING. 30 tablet 0  . glipiZIDE (GLUCOTROL XL) 2.5 MG 24 hr tablet TAKE ONE TABLET BY MOUTH ONCE DAILY WITH BREAKFAST. 30 tablet 0  . Insulin Pen Needle (LITETOUCH PEN NEEDLES) 31G X 8 MM MISC Use to inject insulin twice daily Dx e11.65 100 each 5  . Lancets (ONETOUCH DELICA PLUS XBWIOM35D) MISC USE 3 TIMES A DAY AS DIRECTED. 100 each 0  . LIVALO 2 MG TABS TAKE ONE TABLET BY MOUTH ONCE DAILY AFTER SUPPER. 90 tablet 0  . Multiple Vitamins-Minerals (MULTIVITAMIN PO) Take 1 tablet by mouth every morning.     Marland Kitchen NOVOLOG MIX 70/30 FLEXPEN (70-30) 100 UNIT/ML FlexPen INJECT 12 TO 15 UNITS SUBCUTANEOUSLY TWICE DAILY. 15 mL 0  . ONETOUCH ULTRA test strip CHECK BLOOD SUGAR 3  TIMES A DAY. 100 strip 0  . potassium chloride (KLOR-CON) 10 MEQ tablet Once daily 30 tablet 6  . sacubitril-valsartan (ENTRESTO) 24-26 MG Take 1 tablet by mouth 2 (two) times daily. 60 tablet 6  . spironolactone (ALDACTONE) 25 MG tablet Take 0.5 tablets (12.5 mg total) by mouth daily. 45 tablet 3  . UNABLE TO FIND Shower bench x 1   DX M17.12 1 each 0  . UNABLE TO FIND Rolling walker with seat x 1  DX m17.12 1 each 0   No current facility-administered medications for this visit.     Past Medical History:  Diagnosis Date  . Acute hypoxemic respiratory failure due to COVID-19 ([place]) 06/29/2020  . Anxiety   . Aortic stenosis   . Arthritis   . Biventricular ICD (implantable cardioverter-defibrillator) in place     March 2021, Medtronic  -[name]  . Chronic combined systolic and diastolic CHF (congestive heart failure) ([place])   . CKD (chronic kidney disease) stage 3, GFR 30-59 ml/min (HCC)   . Constipation   . COVID-19 virus infection 07/16/2020  . Depression   . Diabetes mellitus, type 2 ([place])   . Essential hypertension   . Hyperlipidemia   . Incidental pulmonary nodule, > 45m and < 891m   7 x 4 mm LLL nodule  . LBBB (left bundle branch block)   . Nonischemic cardiomyopathy (HCC)    No significant obstructive CAD at heart catheterization 05/2014, LVEF 20%  . Obesity   . Pneumonia 2013  . 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 Symptomatic PVCs     ROS:   All systems reviewed and negative except as noted in the HPI.   Past Surgical History:  Procedure Laterality Date  . BIV ICD INSERTION CRT-D N/A 10/28/2019   Procedure: BIV ICD INSERTION CRT-D;  Surgeon: TaEvans LanceMD;  [facility];  Service: Cardiovascular;  Laterality: N/A;  . CARDIAC CATHETERIZATION    . CESAREAN SECTION  1987  . FLEXIBLE BRONCHOSCOPY N/A 01/14/2015   Procedure: FLEXIBLE BRONCHOSCOPY;  Surgeon: StMelrose NakayamaMD;  [facility] Service: Thoracic;  Laterality: N/A;  . LEFT AND RIGHT HEART CATHETERIZATION WITH CORONARY ANGIOGRAM N/A 12/05/2011   Procedure: LEFT AND RIGHT HEART CATHETERIZATION WITH CORONARY ANGIOGRAM;  Surgeon: ChBurnell BlanksMD;  [facility];  Service: Cardiovascular;  Laterality: N/A;  . LEFT AND RIGHT HEART CATHETERIZATION WITH CORONARY ANGIOGRAM N/A 05/23/2014   Procedure: LEFT AND RIGHT HEART CATHETERIZATION WITH CORONARY ANGIOGRAM;  Surgeon: ChBurnell BlanksMD;  [facility];  Service: Cardiovascular;  Laterality: N/A;  . MULTIPLE EXTRACTIONS WITH ALVEOLOPLASTY N/A 02/27/2014   Procedure: Extraction of tooth #'s  2,4,5,6,7,8,9,10,11,12, 22, 23, 24, 25, 26, 28 with alveoloplasty and bilateral mandibular tori reductions;  Surgeon: RoLenn CalDDS;  [facility] Service: Oral Surgery;  Laterality: N/A;  . RIGID BRONCHOSCOPY N/A 01/14/2015   Procedure: RIGID BRONCHOSCOPY with Removal Of Foreign Body ;  Surgeon: StMelrose NakayamaMD;  [facility] Service: Thoracic;  Laterality: N/A;  . TEE WITHOUT CARDIOVERSION N/A 03/24/2015   Procedure: TRANSESOPHAGEAL ECHOCARDIOGRAM (TEE);  Surgeon: ChBurnell BlanksMD;  [facility] Service: Open Heart Surgery;  Laterality: N/A;  . TRANSCATHETER AORTIC VALVE REPLACEMENT, TRANSFEMORAL Bilateral 03/24/2015   Procedure: TRANSCATHETER AORTIC VALVE REPLACEMENT, TRANSFEMORAL;  Surgeon: ChAnnita Brod  [name], MD;  Location: [place];  Service: Open Heart Surgery;  Laterality: Bilateral;  . TUBAL LIGATION  1987  . VIDEO BRONCHOSCOPY Bilateral 06/05/2014   Procedure: VIDEO BRONCHOSCOPY WITHOUT FLUORO;  Surgeon: [name], MD;  Location[facility] ENDOSCOPY;  Service: Cardiopulmonary;  Laterality: Bilateral;     Family History  Problem Relation Age of Onset  . Heart disease Mother   . Heart attack Mother 12  . Diabetes Mother   . Hypertension Mother   . Colon cancer Father 4  . Breast cancer Sister   . Diabetes Brother   . Hypertension Brother   . Stroke Brother   . Cancer Brother   . Congestive Heart Failure Daughter   . Hypertension Son   . High Cholesterol Son   . Congestive Heart Failure Son      Social History   Socioeconomic History  . Marital status: Widowed    Spouse name: Not on file  . Number of children: 5  . Years of education: Not on file  . Highest education level: Not on file  Occupational History  . Occupation: Retired  Tobacco Use  . Smoking status: Never Smoker  . Smokeless tobacco: Never Used  Vaping Use  . Vaping Use: Never used  Substance and Sexual Activity  . Alcohol use: No  . Drug use: No  . Sexual  activity: Never    Birth control/protection: None, Post-menopausal  Other Topics Concern  . Not on file  Social History Narrative  . Not on file   Social Determinants of Health   Financial Resource Strain: Low Risk   . Difficulty of Paying Living Expenses: Not hard at all  Food Insecurity: No Food Insecurity  . Worried About Charity fundraiser in the Last Year: Never true  . Ran Out of Food in the Last Year: Never true  Transportation Needs: No Transportation Needs  . Lack of Transportation (Medical): No  . Lack of Transportation (Non-Medical): No  Physical Activity: Insufficiently Active  . Days of Exercise per Week: 7 days  . Minutes of Exercise per Session: 20 min  Stress: No Stress Concern Present  . Feeling of Stress : Not at all  Social Connections: Moderately Isolated  . Frequency of Communication with Friends and Family: More than three times a week  . Frequency of Social Gatherings with Friends and Family: More than three times a week  . Attends Religious Services: More than 4 times per year  . Active Member of Clubs or Organizations: No  . Attends Archivist Meetings: Never  . Marital Status: Widowed  Intimate Partner Violence: Not At Risk  . Fear of Current or Ex-Partner: No  . Emotionally Abused: No  . Physically Abused: No  . Sexually Abused: No     BP 116/64   Pulse 62   Ht _0  (1.651 m)   Wt 181 lb (82.1 kg)   SpO2 97%   BMI 30.12 kg/m   Physical Exam:  Well appearing 75 yo woman, NAD HEENT: Unremarkable Neck:  No JVD, no thyromegally Lymphatics:  No adenopathy Back:  No CVA tenderness Lungs:  Clear with no wheezes HEART:  Regular rate rhythm, no murmurs, no rubs, no clicks Abd:  soft, positive bowel sounds, no organomegally, no rebound, no guarding Ext:  2 plus pulses, no edema, no cyanosis, no clubbing Skin:  No rashes no nodules Neuro:  CN II through XII intact, motor grossly intact   DEVICE  Normal device function.  See  PaceArt for details.   Assess/Plan: 1. Chronic systolic heart failure - she is doing well, with class 2 symptoms. No change in her current medical therapy. 2. Biv ICD - her medtronic biv ICD is working normally. She is adaptive pacing. 3. Obesity - I encouraged the patient to watch her oral intake and lose 10 lbs.  4. HTN - her bp is better.  She is encouraged to avoid salty foods.  [name] Overlie Naylea Wigington,MD

## 2021-01-15 DIAGNOSIS — Z5181 Encounter for therapeutic drug level monitoring: Secondary | ICD-10-CM | POA: Diagnosis not present

## 2021-01-15 DIAGNOSIS — I5042 Chronic combined systolic (congestive) and diastolic (congestive) heart failure: Secondary | ICD-10-CM | POA: Diagnosis not present

## 2021-01-15 DIAGNOSIS — N189 Chronic kidney disease, unspecified: Secondary | ICD-10-CM | POA: Diagnosis not present

## 2021-01-15 DIAGNOSIS — I129 Hypertensive chronic kidney disease with stage 1 through stage 4 chronic kidney disease, or unspecified chronic kidney disease: Secondary | ICD-10-CM | POA: Diagnosis not present

## 2021-01-15 DIAGNOSIS — E872 Acidosis: Secondary | ICD-10-CM | POA: Diagnosis not present

## 2021-01-15 DIAGNOSIS — E1122 Type 2 diabetes mellitus with diabetic chronic kidney disease: Secondary | ICD-10-CM | POA: Diagnosis not present

## 2021-01-15 DIAGNOSIS — E559 Vitamin D deficiency, unspecified: Secondary | ICD-10-CM | POA: Diagnosis not present

## 2021-01-18 ENCOUNTER — Other Ambulatory Visit: Payer: Self-pay | Admitting: Family Medicine

## 2021-01-20 ENCOUNTER — Other Ambulatory Visit (HOSPITAL_COMMUNITY): Payer: PPO

## 2021-01-25 ENCOUNTER — Ambulatory Visit (INDEPENDENT_AMBULATORY_CARE_PROVIDER_SITE_OTHER): Payer: PPO

## 2021-01-25 ENCOUNTER — Other Ambulatory Visit: Payer: Self-pay | Admitting: Family Medicine

## 2021-01-25 ENCOUNTER — Telehealth: Payer: Self-pay | Admitting: Cardiology

## 2021-01-25 DIAGNOSIS — I429 Cardiomyopathy, unspecified: Secondary | ICD-10-CM | POA: Diagnosis not present

## 2021-01-25 NOTE — Telephone Encounter (Signed)
Patient called said [facility] faxed something to Dr. [name] regarding her [name] [name] is not covered by her [employer]. It costs $700.00. She said she can not afford the medicine. She needs to know what she should do. She can be reached at [phone].

## 2021-01-26 LAB — CUP PACEART REMOTE DEVICE CHECK
Battery Remaining Longevity: 93 mo
Battery Voltage: 2.98 V
Brady Statistic AP VP Percent: 3.3 %
Brady Statistic AP VS Percent: 0.13 %
Brady Statistic AS VP Percent: 95 %
Brady Statistic AS VS Percent: 1.57 %
Brady Statistic RA Percent Paced: 3.4 %
Brady Statistic RV Percent Paced: 0.26 %
Date Time Interrogation Session: 20220613043825
HighPow Impedance: 66 Ohm
Implantable Lead Implant Date: 20210315
Implantable Lead Implant Date: 20210315
Implantable Lead Implant Date: 20210315
Implantable Lead Location: 753858
Implantable Lead Location: 753859
Implantable Lead Location: 753860
Implantable Lead Model: 4598
Implantable Lead Model: 5076
Implantable Lead Model: 6935
Implantable Pulse Generator Implant Date: 20210315
Lead Channel Impedance Value: 165.029
Lead Channel Impedance Value: 172.541
Lead Channel Impedance Value: 176 Ohm
Lead Channel Impedance Value: 193.707
Lead Channel Impedance Value: 204.14 Ohm
Lead Channel Impedance Value: 304 Ohm
Lead Channel Impedance Value: 342 Ohm
Lead Channel Impedance Value: 361 Ohm
Lead Channel Impedance Value: 361 Ohm
Lead Channel Impedance Value: 399 Ohm
Lead Channel Impedance Value: 399 Ohm
Lead Channel Impedance Value: 418 Ohm
Lead Channel Impedance Value: 513 Ohm
Lead Channel Impedance Value: 589 Ohm
Lead Channel Impedance Value: 608 Ohm
Lead Channel Impedance Value: 608 Ohm
Lead Channel Impedance Value: 646 Ohm
Lead Channel Impedance Value: 665 Ohm
Lead Channel Pacing Threshold Amplitude: 0.5 V
Lead Channel Pacing Threshold Amplitude: 0.75 V
Lead Channel Pacing Threshold Amplitude: 0.875 V
Lead Channel Pacing Threshold Pulse Width: 0.4 ms
Lead Channel Pacing Threshold Pulse Width: 0.4 ms
Lead Channel Pacing Threshold Pulse Width: 0.4 ms
Lead Channel Sensing Intrinsic Amplitude: 15.125 mV
Lead Channel Sensing Intrinsic Amplitude: 15.125 mV
Lead Channel Sensing Intrinsic Amplitude: 2.375 mV
Lead Channel Sensing Intrinsic Amplitude: 2.375 mV
Lead Channel Setting Pacing Amplitude: 1.5 V
Lead Channel Setting Pacing Amplitude: 2 V
Lead Channel Setting Pacing Amplitude: 2.25 V
Lead Channel Setting Pacing Pulse Width: 0.4 ms
Lead Channel Setting Pacing Pulse Width: 0.4 ms
Lead Channel Setting Sensing Sensitivity: 0.3 mV

## 2021-01-26 NOTE — Telephone Encounter (Signed)
Prior auth completed and pt notified.

## 2021-02-04 ENCOUNTER — Other Ambulatory Visit: Payer: Self-pay

## 2021-02-04 ENCOUNTER — Other Ambulatory Visit: Payer: Self-pay | Admitting: Cardiology

## 2021-02-04 ENCOUNTER — Other Ambulatory Visit: Payer: Self-pay | Admitting: Family Medicine

## 2021-02-04 ENCOUNTER — Encounter: Payer: Self-pay | Admitting: Student

## 2021-02-04 ENCOUNTER — Ambulatory Visit (INDEPENDENT_AMBULATORY_CARE_PROVIDER_SITE_OTHER): Payer: PPO | Admitting: Student

## 2021-02-04 VITALS — BP 112/58 | HR 78 | Ht 65.0 in | Wt 181.0 lb

## 2021-02-04 DIAGNOSIS — N1832 Chronic kidney disease, stage 3b: Secondary | ICD-10-CM

## 2021-02-04 DIAGNOSIS — Z95810 Presence of automatic (implantable) cardiac defibrillator: Secondary | ICD-10-CM

## 2021-02-04 DIAGNOSIS — I10 Essential (primary) hypertension: Secondary | ICD-10-CM | POA: Diagnosis not present

## 2021-02-04 DIAGNOSIS — I5042 Chronic combined systolic (congestive) and diastolic (congestive) heart failure: Secondary | ICD-10-CM

## 2021-02-04 DIAGNOSIS — Z952 Presence of prosthetic heart valve: Secondary | ICD-10-CM | POA: Diagnosis not present

## 2021-02-04 NOTE — Patient Instructions (Signed)
Medication Instructions:  Your physician recommends that you continue on your current medications as directed. Please refer to the Current Medication list given to you today.  *If you need a refill on your cardiac medications before your next appointment, please call your pharmacy*   Lab Work: None today  If you have labs (blood work) drawn today and your tests are completely normal, you will receive your results only by: MyChart Message (if you have MyChart) OR A paper copy in the mail If you have any lab test that is abnormal or we need to change your treatment, we will call you to review the results.   Testing/Procedures: None today    Follow-Up: At [facility], you and your health needs are our priority.  As part of our continuing mission to provide you with exceptional heart care, we have created designated Provider Care Teams.  These Care Teams include your primary Cardiologist (physician) and Advanced Practice Providers (APPs -  Physician Assistants and Nurse Practitioners) who all work together to provide you with the care you need, when you need it.  We recommend signing up for the patient portal called "MyChart".  Sign up information is provided on this After Visit Summary.  MyChart is used to connect with patients for Virtual Visits (Telemedicine).  Patients are able to view lab/test results, encounter notes, upcoming appointments, etc.  Non-urgent messages can be sent to your provider as well.   To learn more about what you can do with MyChart, go to [email or web address].    Your next appointment:   3 month(s)  The format for your next appointment:   In Person  Provider:   Samuel McDowell, MD   Other Instructions None   

## 2021-02-04 NOTE — Progress Notes (Signed)
Cardiology Office Note    Date:  02/04/2021   ID:  [name] [name], [name] [name] 02-23-1946, MRN [id number]  PCP:  [name] Helper, MD  Cardiologist: [name] Lesches, MD   EP: Dr. Lovena [name]  Chief Complaint  Patient presents with   Follow-up    6 week visit    History of Present Illness:    [name] [name] is a 75 y.o. female with past medical history of chronic combined systolic and diastolic CHF/NICM (cath in 2015 showing no significant CAD, s/p CRT-D placement in 09/2019, EF 10-15% in 2016, at 20% in 04/2018, at 15-20% in 12/2020), severe AS (s/p TAVR in 03/2015), HTN, HLD, Type 2 DM, Stage 3-4 CKD and known LBBB who presents to the office today for 6-week follow-up.   She was examined by Dr. Domenic [name] in 12/2020 and her recent echocardiogram had shown her EF was reduced at 15-20% and her TAVR prosthesis was functioning normally. She was continued on her current dosing of Lasix and Coreg with Spironolactone being reduced to 12.741m daily and Entresto was started at 24-288mBID with close follow-up labs recommended given her CKD. Follow-up BMET on 5/23 showed her electrolytes were stable but creatinine had trended up from 1.69 to 1.87. She was continued on her current medications at that time and to have repeat labs at her next visit.  She did have repeat labs with [name] [name] on 01/12/2021 and creatinine had improved to 1.58 with K+ at 4.2.  In talking with the patient today, she reports overall doing well from a cardiac perspective. She reports her breathing has been stable and denies any orthopnea, PND or pitting edema.  Weight has been stable on her home scales. No recent chest pain or palpitations. Her main concern at this time is continued bilateral knee pain which is followed by orthopedics. She has been receiving steroid injections with temporary improvement in her pain. She also dropped an object on her left foot last week and has been experiencing intermittent pain and swelling as well (plans  to schedule follow-up with Dr. HaAline Brochure    Past Medical History:  Diagnosis Date   Acute hypoxemic respiratory failure due to COVID-19 ([place]/15/2021   Anxiety    Aortic stenosis    Arthritis    Biventricular ICD (implantable cardioverter-defibrillator) in place    March 2021, [employer]tr[employer]  -Dr. 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   23 mm Edwards Sapien 3 transcatheter heart valve placed via open right transfemoral approach   Symptomatic PVCs     Past Surgical History:  Procedure Laterality Date   BIV ICD INSERTION CRT-D N/A 10/28/2019   Procedure: BIV ICD INSERTION CRT-D;  Surgeon: TaylEvans Lance;  [facility];  Service: Cardiovascular;  Laterality: N/A;   [place]  FLEXIBLE BRONCHOSCOPY N/A 01/14/2015   Procedure: FLEXIBLE BRONCHOSCOPY;  Surgeon: [name], MD;  Location: [place];  Service: Thoracic;  Laterality: N/A;   LEFT AND RIGHT HEART CATHETERIZATION WITH CORONARY ANGIOGRAM N/A 12/05/2011   Procedure: LEFT AND RIGHT HEART CATHETERIZATION WITH CORONARY ANGIOGRAM;  Surgeon: [name], MD;  Location: [facility] CATH LAB;  Service: Cardiovascular;  Laterality: N/A;   LEFT AND RIGHT HEART CATHETERIZATION WITH CORONARY ANGIOGRAM N/A 05/23/2014   Procedure: LEFT AND RIGHT HEART CATHETERIZATION WITH CORONARY ANGIOGRAM;  Surgeon: [name], MD;  Location: [facility] CATH LAB;  Service: Cardiovascular;  Laterality: N/A;   MULTIPLE EXTRACTIONS WITH ALVEOLOPLASTY N/A 02/27/2014   Procedure: Extraction of tooth #'s 2,4,5,6,7,8,9,10,11,12, 22, 23, 24, 25, 26, 28 with alveoloplasty and bilateral mandibular tori reductions;  Surgeon: [name], DDS;  Location: [place] [place];  Service: Oral Surgery;  Laterality: N/A;   RIGID BRONCHOSCOPY N/A 01/14/2015   Procedure: RIGID BRONCHOSCOPY with Removal Of Foreign Body ;  Surgeon: [name], MD;  Location: [place];  Service: Thoracic;  Laterality: N/A;   TEE WITHOUT CARDIOVERSION N/A 03/24/2015   Procedure: TRANSESOPHAGEAL ECHOCARDIOGRAM (TEE);  Surgeon: [name], MD;  Location: [place];  Service: Open Heart Surgery;  Laterality: N/A;   TRANSCATHETER AORTIC VALVE REPLACEMENT, TRANSFEMORAL Bilateral 03/24/2015   Procedure: TRANSCATHETER AORTIC VALVE REPLACEMENT, TRANSFEMORAL;  Surgeon: [name], MD;  Location: [place];  Service: Open Heart Surgery;  Laterality: Bilateral;   TUBAL LIGATION  1987   VIDEO BRONCHOSCOPY Bilateral 06/05/2014   Procedure: VIDEO BRONCHOSCOPY WITHOUT FLUORO;  Surgeon: [name], MD;  Location: [place];  Service: Cardiopulmonary;  Laterality: Bilateral;    Current Medications: Outpatient Medications Prior to Visit  Medication Sig Dispense Refill   acetaminophen (TYLENOL) 500 MG tablet Take one tablet two times daily, by mouth, for knee  pain (Patient taking differently: Take 500 mg by mouth 2 (two) times daily as needed for moderate pain. Take one tablet two times daily, by mouth, for knee  pain) 60 tablet 3   allopurinol (ZYLOPRIM) 100 MG tablet Take by mouth.     aspirin EC 81 MG tablet Take 81 mg by mouth every morning.      blood glucose meter kit and supplies Dispense based on patient and insurance preference. Use to test three times daily (FOR ICD-10 E10.9, E11.9). 1 each 0   carvedilol (COREG) 25 MG tablet TAKE 1 TABLET BY  MOUTH TWICE DAILY WITH MEALS. 60 tablet 6   cetirizine (ZYRTEC) 10 MG tablet Take 1 tablet (10 mg total) by mouth daily. 30 tablet 1   cholecalciferol (VITAMIN D3) 25 MCG (1000 UNIT) tablet Take 1,000 Units by mouth daily.     diclofenac Sodium (VOLTAREN) 1 % GEL Apply 1 application topically 4 (four) times daily as needed (pain).     ezetimibe (ZETIA) 10 MG tablet TAKE ONE TABLET BY MOUTH ONCE DAILY. 90 tablet 0   fenofibrate (TRICOR) 145 MG tablet TAKE (1) TABLET BY MOUTH ONCE DAILY. 30 tablet 0   furosemide (LASIX) 40 MG tablet TAKE (1) TABLET BY MOUTH EACH MORNING. 30 tablet 0   glipiZIDE (GLUCOTROL XL) 2.5 MG 24 hr tablet TAKE ONE TABLET BY MOUTH ONCE DAILY WITH BREAKFAST. 30 tablet 0   Insulin Pen Needle (LITETOUCH PEN NEEDLES) 31G X 8 MM MISC Use to inject insulin twice daily Dx e11.65 100 each 5   Lancets (ONETOUCH DELICA PLUS OMBTDH74B) MISC USE 3 TIMES A DAY AS DIRECTED. 100  each 0   LIVALO 2 MG TABS TAKE ONE TABLET BY MOUTH ONCE DAILY AFTER SUPPER. 90 tablet 0   Multiple Vitamins-Minerals (MULTIVITAMIN PO) Take 1 tablet by mouth every morning.      NOVOLOG MIX 70/30 FLEXPEN (70-30) 100 UNIT/ML FlexPen INJECT 12 TO 15 UNITS SUBCUTANEOUSLY TWICE DAILY. 15 mL 0   ONETOUCH ULTRA test strip CHECK BLOOD SUGAR 3 TIMES A DAY. 100 strip 0   potassium chloride (KLOR-CON) 10 MEQ tablet Once daily 30 tablet 6   sacubitril-valsartan (ENTRESTO) 24-26 MG Take 1 tablet by mouth 2 (two) times daily. 60 tablet 6   spironolactone (ALDACTONE) 25 MG tablet Take 0.5 tablets (12.5 mg total) by mouth daily. 45 tablet 3   UNABLE TO FIND Shower bench x 1   DX M17.12 1 each 0   UNABLE TO FIND Rolling walker with seat x 1  DX m17.12 1 each 0   blood glucose meter kit and supplies Dispense based on patient and insurance preference. Use up to three times daily dx E11.65 1 each 0   No facility-administered medications prior to visit.     Allergies:   Crestor [rosuvastatin], Tramadol, and Penicillins    Social History   Socioeconomic History   Marital status: Widowed    Spouse name: Not on file   Number of children: 5   Years of education: Not on file   Highest education level: Not on file  Occupational History   Occupation: Retired  Tobacco Use   Smoking status: Never   Smokeless tobacco: Never  Vaping Use   Vaping Use: Never used  Substance and Sexual Activity   Alcohol use: No   Drug use: No   Sexual activity: Never    Birth control/protection: None, Post-menopausal  Other Topics Concern   Not on file  Social History Narrative   Not on file   Social Determinants of Health   Financial Resource Strain: Low Risk    Difficulty of Paying Living Expenses: Not hard at all  Food Insecurity: No Food Insecurity   Worried About Charity fundraiser in the Last Year: Never true   [place] in the Last Year: Never true  Transportation Needs: No Transportation Needs   Lack of Transportation (Medical): No   Lack of Transportation (Non-Medical): No  Physical Activity: Insufficiently Active   Days of Exercise per Week: 7 days   Minutes of Exercise per Session: 20 min  Stress: No Stress Concern Present   Feeling of Stress : Not at all  Social Connections: Moderately Isolated   Frequency of Communication with Friends and Family: More than three times a week   Frequency of Social Gatherings with Friends and Family: More than three times a week   Attends Religious Services: More than 4 times per year   Active Member of [employer] or Organizations: No   Attends Archivist Meetings: Never   Marital Status: Widowed     Family History:  The patient's family history includes Breast cancer in her sister; Cancer in her brother; Colon cancer (age of onset: 36) in her father; Congestive Heart Failure in her daughter and son; Diabetes in her brother and mother; Heart attack (age of onset: 48) in her mother; Heart disease in her mother; High Cholesterol in her son; Hypertension in  her brother, mother, and son; Stroke in her brother.   Review of Systems:    Please see the history of present illness.     All other  systems reviewed and are otherwise negative except as noted above.   Physical Exam:    VS:  BP (!) 112/58   Pulse 78   Ht $R'5\' 5"'We$  (1.651 m)   Wt 181 lb (82.1 kg)   SpO2 97%   BMI 30.12 kg/m    General: Pleasant, elderly female appearing in no acute distress. Head: Normocephalic, atraumatic. Neck: No carotid bruits. JVD not elevated.  Lungs: Respirations regular and unlabored, without wheezes or rales.  Heart: Regular rate and rhythm. No S3 or S4. 2/6 SEM along RUSB.  Abdomen: Appears non-distended. No obvious abdominal masses. Msk:  Strength and tone appear normal for age. No obvious joint deformities or effusions. Extremities: No clubbing or cyanosis. No pitting edema.  Distal pedal pulses are 2+ bilaterally. Neuro: Alert and oriented X 3. Moves all extremities spontaneously. No focal deficits noted. Psych:  Responds to questions appropriately with a normal affect. Skin: No rashes or lesions noted  Wt Readings from Last 3 Encounters:  02/04/21 181 lb (82.1 kg)  01/13/21 181 lb (82.1 kg)  12/28/20 177 lb 3.2 oz (80.4 kg)     Studies/Labs Reviewed:   EKG:  EKG is not ordered today.    Recent Labs: 07/04/2020: Magnesium 2.7 10/27/2020: ALT 15; TSH 3.210 12/22/2020: B Natriuretic Peptide 336.0; Hemoglobin 11.9; Platelets 302 01/04/2021: BUN 40; Creatinine, Ser 1.87; Potassium 3.8; Sodium 139   Lipid Panel    Component Value Date/Time   CHOL 171 10/27/2020 1047   TRIG 106 10/27/2020 1047   HDL 45 10/27/2020 1047   CHOLHDL 3.8 10/27/2020 1047   CHOLHDL 2.9 08/20/2019 1001   VLDL 21 02/06/2017 0916   LDLCALC 107 (H) 10/27/2020 1047   LDLCALC 94 08/20/2019 1001    Additional studies/ records that were reviewed today include:   Cardiac Catheterization: 05/2014 Angiographic Findings:   Left main: No evidence of disease.   Left  Anterior Descending Artery: Large caliber vessel that courses to the apex. Large caliber diagonal branch. No evidence of disease.   Circumflex Artery: Large caliber vessel with large obtuse marginal branch. No evidence of disease.   Right Coronary Artery: Moderate sized dominant vessel. No evidence of disease.   Impression: 1. No angiographic evidence of CAD 2. Aortic stenosis, severe by dobutamine echo   Recommendations: Planning in place for aortic valve replacement. Pt will be admitted overnight for hydration post cath given renal insufficiency.       Complications:  None; patient tolerated the procedure well.                 Echocardiogram: 12/2020 IMPRESSIONS     1. Left ventricular ejection fraction, by estimation, is 15-20%. The left  ventricle has severely decreased function. The left ventricle demonstrates  global hypokinesis. The left ventricular internal cavity size was mildly  dilated. There is moderate  asymmetric left ventricular hypertrophy. Left ventricular diastolic  parameters are indeterminate.   2. Right ventricular systolic function is normal. The right ventricular  size is normal. There is normal pulmonary artery systolic pressure. The  estimated right ventricular systolic pressure is 76.2 mmHg.   3. Left atrial size was moderately dilated.   4. The mitral valve is abnormal. Mild mitral valve regurgitation.   5. The aortic valve has been repaired/replaced. Aortic valve  regurgitation is not visualized. There is a 23 mm Sapien prosthetic (TAVR)  valve present in the aortic position. Aortic valve mean gradient measures  13.5 mmHg. No paravalvular regurgitation.   6. The inferior vena  cava is normal in size with greater than 50%  respiratory variability, suggesting right atrial pressure of 3 mmHg.  Assessment:    1. Chronic combined systolic and diastolic CHF (congestive heart failure) ([place])   2. ICD (implantable cardioverter-defibrillator) in place   3.  S/P TAVR (transcatheter aortic valve replacement)   4. Essential hypertension   5. Stage 3b chronic kidney disease ([place])      Plan:   In order of problems listed above:  1. HFrEF/NICM - She underwent prior catheterization in 2015 showing no significant CAD and EF was approximately 10 to 15% at that time but had transiently improved to 25% in the interim. EF was at 15 to 20% by repeat echo in 12/2020. - She denies any orthopnea, PND or lower extremity edema. Appears euvolemic by examination today.  She has been tolerating Entresto well and denies any noted side effects.  Her renal function has thankfully been very stable with the addition of this. - Will continue her current cardiac medications with Coreg 25 mg twice daily, Entresto 24-26 mg twice daily, Lasix 40 mg daily and Spironolactone 12.5 mg daily. I did not further titrate her Delene Loll today given her BP at 112/58. Would not plan to start an SGLT2 inhibitor given her renal insufficiency.   2. Biventricular ICD - She is s/p Medtronic CRT-D placement in 09/2019 which is followed by Dr. Lovena [name].   3. Severe AS - She is s/p TAVR in 03/2015 and recent echocardiogram last month showed her valve was functioning normally with no significant stenosis or regurgitation.  4. HTN - BP is well controlled at 112/58 during today's visit. Continue current medication regimen.  5. Stage 3-4 CKD - Followed by Dr. Theador Hawthorne. Creatinine had improved to 1.58 by recent labs on 01/12/2021 and she is scheduled for repeat labs next month.     Medication Adjustments/Labs and Tests Ordered: Current medicines are reviewed at length with the patient today.  Concerns regarding medicines are outlined above.  Medication changes, Labs and Tests ordered today are listed in the Patient Instructions below. Patient Instructions  Medication Instructions:  Your physician recommends that you continue on your current medications as directed. Please refer to the Current  Medication list given to you today.  *If you need a refill on your cardiac medications before your next appointment, please call your pharmacy*   Lab Work: None today  If you have labs (blood work) drawn today and your tests are completely normal, you will receive your results only by: [place] (if you have MyChart) OR A paper copy in the mail If you have any lab test that is abnormal or we need to change your treatment, we will call you to review the results.   Testing/Procedures: None today    Follow-Up: At [facility], you and your health needs are our priority.  As part of our continuing mission to provide you with exceptional heart care, we have created designated Provider Care Teams.  These Care Teams include your primary Cardiologist (physician) and Advanced Practice Providers (APPs -  Physician Assistants and Nurse Practitioners) who all work together to provide you with the care you need, when you need it.  We recommend signing up for the patient portal called "MyChart".  Sign up information is provided on this After Visit Summary.  MyChart is used to connect with patients for Virtual Visits (Telemedicine).  Patients are able to view lab/test results, encounter notes, upcoming appointments, etc.  Non-urgent messages can be sent to  your provider as well.   To learn more about what you can do with MyChart, go to [email or web address].    Your next appointment:   3 month(s)  The format for your next appointment:   In Person  Provider:   Rozann Lesches, MD   Other Instructions None      Signed, [name] Heritage, PA-C  02/04/2021 5:20 PM    [facility] [address] [place], [place] [address] Phone: [phone] Fax: [phone]

## 2021-02-05 ENCOUNTER — Emergency Department (HOSPITAL_COMMUNITY)
Admission: EM | Admit: 2021-02-05 | Discharge: 2021-02-05 | Disposition: A | Discharge status: Home / Self Care | Payer: PPO | Attending: Emergency Medicine | Admitting: Emergency Medicine

## 2021-02-05 ENCOUNTER — Emergency Department (HOSPITAL_COMMUNITY): Payer: PPO

## 2021-02-05 ENCOUNTER — Other Ambulatory Visit: Payer: Self-pay

## 2021-02-05 ENCOUNTER — Encounter (HOSPITAL_COMMUNITY): Payer: Self-pay

## 2021-02-05 DIAGNOSIS — I130 Hypertensive heart and chronic kidney disease with heart failure and stage 1 through stage 4 chronic kidney disease, or unspecified chronic kidney disease: Secondary | ICD-10-CM | POA: Insufficient documentation

## 2021-02-05 DIAGNOSIS — S9032XA Contusion of left foot, initial encounter: Secondary | ICD-10-CM | POA: Diagnosis not present

## 2021-02-05 DIAGNOSIS — W208XXA Other cause of strike by thrown, projected or falling object, initial encounter: Secondary | ICD-10-CM | POA: Insufficient documentation

## 2021-02-05 DIAGNOSIS — M79672 Pain in left foot: Secondary | ICD-10-CM | POA: Diagnosis not present

## 2021-02-05 DIAGNOSIS — N183 Chronic kidney disease, stage 3 unspecified: Secondary | ICD-10-CM | POA: Diagnosis not present

## 2021-02-05 DIAGNOSIS — Z7984 Long term (current) use of oral hypoglycemic drugs: Secondary | ICD-10-CM | POA: Insufficient documentation

## 2021-02-05 DIAGNOSIS — S99922A Unspecified injury of left foot, initial encounter: Secondary | ICD-10-CM | POA: Diagnosis present

## 2021-02-05 DIAGNOSIS — I5042 Chronic combined systolic (congestive) and diastolic (congestive) heart failure: Secondary | ICD-10-CM | POA: Diagnosis not present

## 2021-02-05 DIAGNOSIS — Z8616 Personal history of COVID-19: Secondary | ICD-10-CM | POA: Diagnosis not present

## 2021-02-05 DIAGNOSIS — Z7982 Long term (current) use of aspirin: Secondary | ICD-10-CM | POA: Diagnosis not present

## 2021-02-05 DIAGNOSIS — E1122 Type 2 diabetes mellitus with diabetic chronic kidney disease: Secondary | ICD-10-CM | POA: Diagnosis not present

## 2021-02-05 DIAGNOSIS — Z79899 Other long term (current) drug therapy: Secondary | ICD-10-CM | POA: Diagnosis not present

## 2021-02-05 DIAGNOSIS — M7989 Other specified soft tissue disorders: Secondary | ICD-10-CM | POA: Diagnosis not present

## 2021-02-05 DIAGNOSIS — Z794 Long term (current) use of insulin: Secondary | ICD-10-CM | POA: Diagnosis not present

## 2021-02-05 NOTE — ED Provider Notes (Signed)
[facility] EMERGENCY DEPARTMENT Provider Note  CSN: [id number] Arrival date & time: 02/05/21 0109    History Chief Complaint  Patient presents with   Foot Injury     Foot Injury  [name] [name] is a 75 y.o. female reports she dropped the lid from a toilet tank on her L foot about 2 weeks ago. She has had pain and swelling since then. Did not see PCP and was not able to see her orthopedic doctor either. She walks with a cane at baseline due to knee OA.    Past Medical History:  Diagnosis Date   Acute hypoxemic respiratory failure due to COVID-19 ([place]) 06/29/2020   Anxiety    Aortic stenosis    Arthritis    Biventricular ICD (implantable cardioverter-defibrillator) in place    March 2021, [employer]  -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   23 mm Edwards Sapien 3 transcatheter heart valve placed via open right transfemoral approach   Symptomatic PVCs     Past Surgical History:  Procedure Laterality Date   BIV ICD INSERTION CRT-D N/A 10/28/2019   Procedure: BIV ICD INSERTION CRT-D;  Surgeon: TaEvans LanceMD;  [facility];  Service: Cardiovascular;  Laterality: N/A;   CARDIAC CATHETERIZATION     CESAREAN SECTION  1987   FLEXIBLE BRONCHOSCOPY N/A 01/14/2015   Procedure: FLEXIBLE BRONCHOSCOPY;  Surgeon: StMelrose NakayamaMD;  [place] [facility]: Thoracic;  Laterality: N/A;    LEFT AND RIGHT HEART CATHETERIZATION WITH CORONARY ANGIOGRAM N/A 12/05/2011   Procedure: LEFT AND RIGHT HEART CATHETERIZATION WITH CORONARY ANGIOGRAM;  Surgeon: ChBurnell BlanksMD;  [facility];  Service: Cardiovascular;  Laterality: N/A;   LEFT AND RIGHT HEART CATHETERIZATION WITH CORONARY ANGIOGRAM N/A 05/23/2014   Procedure: LEFT AND RIGHT HEART CATHETERIZATION WITH CORONARY ANGIOGRAM;  Surgeon: ChBurnell BlanksMD;  [facility];  Service: Cardiovascular;  Laterality: N/A;   MULTIPLE EXTRACTIONS WITH ALVEOLOPLASTY N/A 02/27/2014   Procedure: Extraction of tooth #'s 2,4,5,6,7,8,9,10,11,12, 22, 23, 24, 25, 26, 28 with alveoloplasty and bilateral mandibular tori reductions;  Surgeon: RoLenn CalDDS;  [facility] Service: Oral Surgery;  Laterality: N/A;   RIGID BRONCHOSCOPY N/A 01/14/2015   Procedure: RIGID BRONCHOSCOPY with Removal Of Foreign Body ;  Surgeon: StMelrose NakayamaMD;  [place] [facility] Service: Thoracic;  Laterality: N/A;   TEE WITHOUT CARDIOVERSION N/A 03/24/2015   Procedure: TRANSESOPHAGEAL ECHOCARDIOGRAM (TEE);  Surgeon: ChBurnell BlanksMD;  [place]: [facility] Service: Open Heart Surgery;  Laterality: N/A;   TRANSCATHETER AORTIC VALVE REPLACEMENT, TRANSFEMORAL Bilateral 03/24/2015   Procedure: TRANSCATHETER AORTIC VALVE REPLACEMENT, TRANSFEMORAL;  Surgeon: ChBurnell BlanksMD;  [place] [facility] Service: Open Heart Surgery;  Laterality: Bilateral;   [place]  VIDEO BRONCHOSCOPY Bilateral 06/05/2014   Procedure: VIDEO BRONCHOSCOPY WITHOUT FLUORO;  Surgeon: [name], MD;  [facility]OSCOPY;  Service: Cardiopulmonary;  Laterality: Bilateral;    Family History  Problem Relation Age of Onset   Heart disease Mother    Heart attack Mother 67   Diabetes Mother    Hypertension Mother    Colon cancer Father 76   Breast cancer Sister    Diabetes Brother    Hypertension Brother    Stroke Brother     Cancer Brother    Congestive Heart Failure Daughter    Hypertension Son    High Cholesterol Son    Congestive Heart Failure Son     Social History   Tobacco Use   Smoking status: Never   Smokeless tobacco: Never  Vaping Use   Vaping Use: Never used  Substance Use Topics   Alcohol use: No   Drug use: No     Home Medications Prior to Admission medications   Medication Sig Start Date End Date Taking? Authorizing Provider  acetaminophen (TYLENOL) 500 MG tablet Take one tablet two times daily, by mouth, for knee  pain Patient taking differently: Take 500 mg by mouth 2 (two) times daily as needed for moderate pain. Take one tablet two times daily, by mouth, for knee  pain 07/18/19   Fayrene Helper, MD  allopurinol (ZYLOPRIM) 100 MG tablet Take by mouth. 11/04/20 11/04/21  [provider]  aspirin EC 81 MG tablet Take 81 mg by mouth every morning.     [provider]  blood glucose meter kit and supplies Dispense based on patient and insurance preference. Use to test three times daily (FOR ICD-10 E10.9, E11.9). 12/02/20   Fayrene Helper, MD  carvedilol (COREG) 25 MG tablet TAKE 1 TABLET BY MOUTH TWICE DAILY WITH MEALS. 02/05/21   [name], MD  cetirizine (ZYRTEC) 10 MG tablet Take 1 tablet (10 mg total) by mouth daily. 07/29/20 02/04/21  Lindell Spar, MD  cholecalciferol (VITAMIN D3) 25 MCG (1000 UNIT) tablet Take 1,000 Units by mouth daily.    [provider]  diclofenac Sodium (VOLTAREN) 1 % GEL Apply 1 application topically 4 (four) times daily as needed (pain).    [provider]  ezetimibe (ZETIA) 10 MG tablet TAKE ONE TABLET BY MOUTH ONCE DAILY. 01/18/21   Fayrene Helper, MD  fenofibrate (TRICOR) 145 MG tablet TAKE (1) TABLET BY MOUTH ONCE DAILY. 01/18/21   Fayrene Helper, MD  furosemide (LASIX) 40 MG tablet TAKE (1) TABLET BY MOUTH EACH MORNING. 02/05/21   Fayrene Helper, MD  glipiZIDE (GLUCOTROL XL) 2.5 MG 24 hr tablet  TAKE ONE TABLET BY MOUTH ONCE DAILY WITH BREAKFAST. 01/26/21   Fayrene Helper, MD  Insulin Pen Needle (LITETOUCH PEN NEEDLES) 31G X 8 MM MISC Use to inject insulin twice daily Dx e11.65 12/09/20   Fayrene Helper, MD  Lancets [facility] DELICA PLUS [id number]) [place] USE 3 TIMES A DAY AS DIRECTED. 09/25/20   Fayrene Helper, MD  LIVALO 2 MG TABS TAKE ONE TABLET BY MOUTH ONCE DAILY AFTER SUPPER. 12/03/20   Noreene Larsson, NP  Multiple Vitamins-Minerals (MULTIVITAMIN PO) Take 1 tablet by mouth every morning.     [provider]  NOVOLOG MIX 70/30 FLEXPEN (70-30) 100 UNIT/ML FlexPen INJECT 12 TO 15 UNITS SUBCUTANEOUSLY TWICE DAILY. 02/05/21   Fayrene Helper, MD  ONETOUCH ULTRA test strip CHECK BLOOD SUGAR 3 TIMES A DAY.  10/30/20   Fayrene Helper, MD  potassium chloride (KLOR-CON) 10 MEQ tablet Once daily 09/28/20   Fayrene Helper, MD  sacubitril-valsartan (ENTRESTO) 24-26 MG Take 1 tablet by mouth 2 (two) times daily. 12/24/20   [name], MD  spironolactone (ALDACTONE) 25 MG tablet Take 0.5 tablets (12.5 mg total) by mouth daily. 12/24/20   [name], MD  UNABLE TO FIND Shower bench x 1   DX P50.93 05/02/19   Fayrene Helper, MD  UNABLE TO FIND Rolling walker with seat x 1  DX m17.12 05/02/19   Fayrene Helper, MD  ferrous sulfate 325 (65 FE) MG EC tablet Take 1 tablet (325 mg total) by mouth 2 (two) times daily. 07/14/11 10/28/11  Fayrene Helper, MD  FLUoxetine (PROZAC) 10 MG tablet Take 1 tablet (10 mg total) by mouth daily. Take one capsule by mouth once a day 12/20/10 11/03/11  Fayrene Helper, MD     Allergies    Crestor [rosuvastatin], Tramadol, and Penicillins   Review of Systems   Review of Systems  Musculoskeletal:        Foot pain Foot swelling    Physical Exam BP 134/70 (BP Location: Right Arm)   Pulse 72   Temp 98.5 F (36.9 C) (Oral)   Resp 18   Ht _0  (1.651 m)   Wt 82.1 kg   SpO2 100%   BMI 30.12 kg/m    Physical Exam Vitals and nursing note reviewed.  HENT:     Head: Normocephalic.     Nose: Nose normal.  Eyes:     Extraocular Movements: Extraocular movements intact.  Pulmonary:     Effort: Pulmonary effort is normal.  Musculoskeletal:        General: Normal range of motion.     Cervical back: Neck supple.     Comments: Soft tissue swelling and mild tenderness of the dorsal L foot, old abrasion is healing. No erythema, warmth or induration to suggest infection  Skin:    Findings: No rash (on exposed skin).  Neurological:     Mental Status: She is alert and oriented to person, place, and time.  Psychiatric:        Mood and Affect: Mood normal.     ED Results / Procedures / Treatments   Labs (all labs ordered are listed, but only abnormal results are displayed) Labs Reviewed - No data to display  EKG None  Radiology DG Foot Complete Left  Result Date: 02/05/2021 CLINICAL DATA:  Posttraumatic foot pain and swelling beginning 2 weeks ago EXAM: LEFT FOOT - COMPLETE 3+ VIEW COMPARISON:  None. FINDINGS: Dorsal soft tissue swelling. No acute fracture or subluxation. Generalized osteopenia. IMPRESSION: Soft tissue swelling without fracture. Electronically Signed   By: Monte Fantasia M.D.   On: 02/05/2021 07:47    Procedures Procedures  Medications Ordered in the ED Medications - No data to display   MDM Rules/Calculators/A&P MDM   ED Course  I have reviewed the triage vital signs and the nursing notes.  Pertinent labs & imaging results that were available during my care of the patient were reviewed by me and considered in my medical decision making (see chart for details).     Final Clinical Impression(s) / ED Diagnoses Final diagnoses:  Contusion of left foot, initial encounter    Rx / DC Orders ED Discharge Orders     None        Truddie Hidden, MD 02/05/21 [phone]

## 2021-02-05 NOTE — ED Triage Notes (Signed)
Pt reports dropped a lid off the back of a commode onto her left foot 2 weeks ago.  Swelling noted. Pt ambulatory with cane.

## 2021-02-08 ENCOUNTER — Ambulatory Visit (INDEPENDENT_AMBULATORY_CARE_PROVIDER_SITE_OTHER): Payer: PPO | Admitting: Nurse Practitioner

## 2021-02-08 ENCOUNTER — Encounter: Payer: Self-pay | Admitting: Nurse Practitioner

## 2021-02-08 ENCOUNTER — Other Ambulatory Visit: Payer: Self-pay | Admitting: Internal Medicine

## 2021-02-08 ENCOUNTER — Other Ambulatory Visit: Payer: Self-pay

## 2021-02-08 VITALS — BP 123/66 | HR 82 | Temp 98.1°F | Ht 65.0 in | Wt 138.0 lb

## 2021-02-08 DIAGNOSIS — G4486 Cervicogenic headache: Secondary | ICD-10-CM | POA: Insufficient documentation

## 2021-02-08 DIAGNOSIS — M542 Cervicalgia: Secondary | ICD-10-CM | POA: Insufficient documentation

## 2021-02-08 MED ORDER — TRAMADOL HCL 50 MG PO TABS: 50.0000 mg | ORAL_TABLET | Freq: Three times a day (TID) | ORAL | 0 refills | Status: AC | PRN

## 2021-02-08 MED ORDER — TIZANIDINE HCL 4 MG PO TABS: 4.0000 mg | ORAL_TABLET | Freq: Four times a day (QID) | ORAL | 0 refills | Status: DC | PRN

## 2021-02-08 NOTE — Assessment & Plan Note (Signed)
-  headache is likely cervicogenic since she has palpable neck tenderness and no neuro abnormalities -however, we discussed if her pain gets worse she should be evaluated at the emergency department

## 2021-02-08 NOTE — Assessment & Plan Note (Signed)
-  has tenderness to neck -limited ROM -no known injury -Rx. Tizanidine and tramadol

## 2021-02-08 NOTE — Patient Instructions (Signed)
I sent in tramadol, which has caused nausea in the past. This is a common side effect with this class of medication. You can take half of a tablet to cut back on the side effects if a whole tablet is causing nausea.

## 2021-02-08 NOTE — Progress Notes (Signed)
Acute Office Visit  Subjective:    Patient ID: [name] [name], female    DOB: [birth year], 75 y.o.   MRN: [id number]  Chief Complaint  Patient presents with   Headache    Onoging x1 day, hurts into the back of her neck.    Headache  Associated symptoms include neck pain.  Patient is in today for headache that started today.  She states the back of her neck is sore and the back of her head is sore. She has taken tylenol, but that didn't hurt.  She states this is the worst headache of her life.   Past Medical History:  Diagnosis Date   Acute hypoxemic respiratory failure due to COVID-19 ([place]) 06/29/2020   Anxiety    Aortic stenosis    Arthritis    Biventricular ICD (implantable cardioverter-defibrillator) in place    March 2021, [employer]  -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   23 mm Edwards Sapien 3 transcatheter heart valve placed via open right transfemoral approach   Symptomatic PVCs     Past Surgical History:  Procedure Laterality Date   BIV ICD INSERTION CRT-D N/A 10/28/2019   Procedure: BIV ICD INSERTION CRT-D;  Surgeon: TaEvans LanceMD;  [facility];  Service: Cardiovascular;  Laterality: N/A;   CARDIAC CATHETERIZATION     CESAREAN SECTION  1987   FLEXIBLE BRONCHOSCOPY N/A 01/14/2015   Procedure: FLEXIBLE BRONCHOSCOPY;  Surgeon: StMelrose NakayamaMD;  [place] [facility]  Service: Thoracic;  Laterality: N/A;   LEFT AND RIGHT HEART CATHETERIZATION WITH CORONARY ANGIOGRAM N/A 12/05/2011   Procedure: LEFT AND RIGHT HEART CATHETERIZATION WITH CORONARY ANGIOGRAM;  Surgeon: ChBurnell BlanksMD;  [facility];  Service: Cardiovascular;  Laterality: N/A;   LEFT AND RIGHT HEART CATHETERIZATION WITH CORONARY ANGIOGRAM N/A 05/23/2014   Procedure: LEFT AND RIGHT HEART CATHETERIZATION WITH CORONARY ANGIOGRAM;  Surgeon: ChBurnell BlanksMD;  [facility];  Service: Cardiovascular;  Laterality: N/A;   MULTIPLE EXTRACTIONS WITH ALVEOLOPLASTY N/A 02/27/2014   Procedure: Extraction of tooth #'s 2,4,5,6,7,8,9,10,11,12, 22, 23, 24, 25, 26, 28 with alveoloplasty and bilateral mandibular tori reductions;  Surgeon: RoLenn CalDDS;  [place] [facility] Service: Oral Surgery;  Laterality: N/A;   RIGID BRONCHOSCOPY N/A 01/14/2015   Procedure: RIGID BRONCHOSCOPY with Removal Of Foreign Body ;  Surgeon: StMelrose NakayamaMD;  Location: [facility] Service: Thoracic;  Laterality: N/A;   TEE WITHOUT CARDIOVERSION N/A 03/24/2015   Procedure: TRANSESOPHAGEAL ECHOCARDIOGRAM (TEE);  Surgeon: ChBurnell BlanksMD;  [place] [facility] Service: Open Heart Surgery;  Laterality: N/A;   TRANSCATHETER AORTIC VALVE REPLACEMENT, TRANSFEMORAL Bilateral 03/24/2015   Procedure: TRANSCATHETER AORTIC VALVE REPLACEMENT, TRANSFEMORAL;  Surgeon: ChBurnell BlanksMD;  [place]: [facility] Service: Open Heart Surgery;  Laterality: Bilateral;   TUBAL LIGATION  1987   VIDEO BRONCHOSCOPY Bilateral 06/05/2014   Procedure: VIDEO BRONCHOSCOPY WITHOUT FLUORO;  Surgeon: [name], MD;  Location: [place];  Service: Cardiopulmonary;  Laterality: Bilateral;    Family History  Problem Relation Age of Onset   Heart disease Mother    Heart attack Mother 69   Diabetes Mother    Hypertension Mother    Colon cancer Father 73   Breast cancer Sister    Diabetes Brother     Hypertension Brother    Stroke Brother    Cancer Brother    Congestive Heart Failure Daughter    Hypertension Son    High Cholesterol Son    Congestive Heart Failure Son     Social History   Socioeconomic History   Marital status: Widowed    Spouse name: Not on file   Number of children: 5   Years of education: Not on file   Highest education level: Not on file  Occupational History   Occupation: Retired  Tobacco Use   Smoking status: Never   Smokeless tobacco: Never  Vaping Use   Vaping Use: Never used  Substance and Sexual Activity   Alcohol use: No   Drug use: No   Sexual activity: Never    Birth control/protection: None, Post-menopausal  Other Topics Concern   Not on file  Social History Narrative   Not on file   Social Determinants of Health   Financial Resource Strain: Low Risk    Difficulty of Paying Living Expenses: Not hard at all  Food Insecurity: No Food Insecurity   Worried About Charity fundraiser in the Last Year: Never true   [place] in the Last Year: Never true  Transportation Needs: No Transportation Needs   Lack of Transportation (Medical): No   Lack of Transportation (Non-Medical): No  Physical Activity: Insufficiently Active   Days of Exercise per Week: 7 days   Minutes of Exercise per Session: 20 min  Stress: No Stress Concern Present   Feeling of Stress : Not at all  Social Connections: Moderately Isolated   Frequency of Communication with Friends and Family: More than three times a week   Frequency of Social Gatherings with Friends and Family: More than three times a week   Attends Religious Services: More than 4 times per year   Active Member of [employer] or Organizations: No   Attends Archivist Meetings: Never   Marital Status: Widowed  Human resources officer Violence: Not At Risk   Fear of Current or Ex-Partner: No   Emotionally Abused: No   Physically Abused: No   Sexually Abused: No    Outpatient Medications Prior to  Visit  Medication Sig Dispense Refill   acetaminophen (TYLENOL) 500 MG tablet Take one tablet two times daily, by mouth, for knee  pain (Patient taking differently: Take 500 mg by mouth 2 (two) times daily as needed for moderate pain. Take one tablet two times daily, by mouth, for knee  pain) 60 tablet 3   allopurinol (ZYLOPRIM) 100 MG tablet Take by mouth.     aspirin EC 81 MG tablet Take 81 mg by mouth every morning.      blood glucose meter kit and supplies Dispense based on patient and insurance preference. Use to test three times daily (FOR ICD-10 E10.9, E11.9). 1 each 0   carvedilol (COREG) 25 MG tablet TAKE 1 TABLET BY MOUTH TWICE DAILY WITH MEALS. 60 tablet  6   cholecalciferol (VITAMIN D3) 25 MCG (1000 UNIT) tablet Take 1,000 Units by mouth daily.     diclofenac Sodium (VOLTAREN) 1 % GEL Apply 1 application topically 4 (four) times daily as needed (pain).     ezetimibe (ZETIA) 10 MG tablet TAKE ONE TABLET BY MOUTH ONCE DAILY. 90 tablet 0   fenofibrate (TRICOR) 145 MG tablet TAKE (1) TABLET BY MOUTH ONCE DAILY. 30 tablet 0   furosemide (LASIX) 40 MG tablet TAKE (1) TABLET BY MOUTH EACH MORNING. 30 tablet 0   glipiZIDE (GLUCOTROL XL) 2.5 MG 24 hr tablet TAKE ONE TABLET BY MOUTH ONCE DAILY WITH BREAKFAST. 30 tablet 0   Insulin Pen Needle (LITETOUCH PEN NEEDLES) 31G X 8 MM MISC Use to inject insulin twice daily Dx e11.65 100 each 5   Lancets (ONETOUCH DELICA PLUS [id number]) MISC USE 3 TIMES A DAY AS DIRECTED. 100 each 0   LIVALO 2 MG TABS TAKE ONE TABLET BY MOUTH ONCE DAILY AFTER SUPPER. 90 tablet 0   Multiple Vitamins-Minerals (MULTIVITAMIN PO) Take 1 tablet by mouth every morning.      NOVOLOG MIX 70/30 FLEXPEN (70-30) 100 UNIT/ML FlexPen INJECT 12 TO 15 UNITS SUBCUTANEOUSLY TWICE DAILY. 15 mL 0   ONETOUCH ULTRA test strip CHECK BLOOD SUGAR 3 TIMES A DAY. 100 strip 0   potassium chloride (KLOR-CON) 10 MEQ tablet Once daily 30 tablet 6   sacubitril-valsartan (ENTRESTO) 24-26 MG Take 1 tablet  by mouth 2 (two) times daily. 60 tablet 6   spironolactone (ALDACTONE) 25 MG tablet Take 0.5 tablets (12.5 mg total) by mouth daily. 45 tablet 3   UNABLE TO FIND Shower bench x 1   DX M17.12 1 each 0   UNABLE TO FIND Rolling walker with seat x 1  DX m17.12 1 each 0   cetirizine (ZYRTEC) 10 MG tablet Take 1 tablet (10 mg total) by mouth daily. 30 tablet 1   No facility-administered medications prior to visit.    Allergies  Allergen Reactions   Crestor [Rosuvastatin] Other (See Comments)    Muscle aches and elevated CK   Tramadol Nausea Only    States blood sugar elevated also   Penicillins Rash and Other (See Comments)    Family unsure of reaction, but certain mom said she was allergic Did it involve swelling of the face/tongue/throat, SOB, or low BP? No Did it involve sudden or severe rash/hives, skin peeling, or any reaction on the inside of your mouth or nose? Yes Did you need to seek medical attention at a hospital or doctor's office? Yes When did it last happen?  10 + years     If all above answers are "NO", may proceed with cephalosporin use.     Review of Systems  Constitutional: Negative.   Respiratory: Negative.    Cardiovascular: Negative.   Musculoskeletal:  Positive for neck pain.  Neurological:  Positive for headaches.      Objective:    Physical Exam Constitutional:      Appearance: She is well-developed.  Neurological:     Mental Status: She is alert and oriented to person, place, and time. Mental status is at baseline.     GCS: GCS eye subscore is 4. GCS verbal subscore is 5. GCS motor subscore is 6.     Cranial Nerves: No cranial nerve deficit, dysarthria or facial asymmetry.     Sensory: No sensory deficit.     Motor: No weakness.    BP 123/66 (BP Location: Left Arm,  Patient Position: Sitting, Cuff Size: Large)   Pulse 82   Temp 98.1 F (36.7 C) (Temporal)   Ht _0  (1.651 m)   Wt 138 lb (62.6 kg)   SpO2 96%   BMI 22.96 kg/m  Wt Readings  from Last 3 Encounters:  02/08/21 138 lb (62.6 kg)  02/05/21 181 lb (82.1 kg)  02/04/21 181 lb (82.1 kg)    Health Maintenance Due  Topic Date Due   COLONOSCOPY (Pts 45-67yr Insurance coverage will need to be confirmed)  Never done   DEXA SCAN  Never done   OPHTHALMOLOGY EXAM  05/31/2017   FOOT EXAM  08/21/2020    There are no preventive care reminders to display for this patient.   Lab Results  Component Value Date   TSH 3.210 10/27/2020   Lab Results  Component Value Date   WBC 8.9 12/22/2020   HGB 11.9 (L) 12/22/2020   HCT 37.5 12/22/2020   MCV 87.8 12/22/2020   PLT 302 12/22/2020   Lab Results  Component Value Date   NA 139 01/04/2021   K 3.8 01/04/2021   CO2 25 01/04/2021   GLUCOSE 143 (H) 01/04/2021   BUN 40 (H) 01/04/2021   CREATININE 1.87 (H) 01/04/2021   BILITOT 0.3 10/27/2020   ALKPHOS 52 10/27/2020   AST 22 10/27/2020   ALT 15 10/27/2020   PROT 6.9 10/27/2020   ALBUMIN 4.3 10/27/2020   CALCIUM 9.2 01/04/2021   ANIONGAP 7 01/04/2021   EGFR 29 (L) 10/27/2020   Lab Results  Component Value Date   CHOL 171 10/27/2020   Lab Results  Component Value Date   HDL 45 10/27/2020   Lab Results  Component Value Date   LDLCALC 107 (H) 10/27/2020   Lab Results  Component Value Date   TRIG 106 10/27/2020   Lab Results  Component Value Date   CHOLHDL 3.8 10/27/2020   Lab Results  Component Value Date   HGBA1C 7.6 (H) 10/27/2020       Assessment & Plan:   Problem List Items Addressed This Visit       Other   Neck pain    -has tenderness to neck -limited ROM -no known injury -Rx. Tizanidine and tramadol       Relevant Medications   traMADol (ULTRAM) 50 MG tablet   tiZANidine (ZANAFLEX) 4 MG tablet   Cervicogenic headache - Primary    -headache is likely cervicogenic since she has palpable neck tenderness and no neuro abnormalities -however, we discussed if her pain gets worse she should be evaluated at the emergency department        Relevant Medications   traMADol (ULTRAM) 50 MG tablet   tiZANidine (ZANAFLEX) 4 MG tablet     Meds ordered this encounter  Medications   traMADol (ULTRAM) 50 MG tablet    Sig: Take 1 tablet (50 mg total) by mouth every 8 (eight) hours as needed for up to 5 days.    Dispense:  15 tablet    Refill:  0   tiZANidine (ZANAFLEX) 4 MG tablet    Sig: Take 1 tablet (4 mg total) by mouth every 6 (six) hours as needed for muscle spasms.    Dispense:  30 tablet    Refill:  0     JNoreene Larsson NP

## 2021-02-13 ENCOUNTER — Emergency Department (HOSPITAL_COMMUNITY): Payer: PPO

## 2021-02-13 ENCOUNTER — Encounter (HOSPITAL_COMMUNITY): Payer: Self-pay | Admitting: *Deleted

## 2021-02-13 ENCOUNTER — Emergency Department (HOSPITAL_COMMUNITY)
Admission: EM | Admit: 2021-02-13 | Discharge: 2021-02-13 | Disposition: A | Discharge status: Home / Self Care | Payer: PPO | Attending: Emergency Medicine | Admitting: Emergency Medicine

## 2021-02-13 ENCOUNTER — Other Ambulatory Visit: Payer: Self-pay

## 2021-02-13 DIAGNOSIS — R519 Headache, unspecified: Secondary | ICD-10-CM

## 2021-02-13 DIAGNOSIS — Z794 Long term (current) use of insulin: Secondary | ICD-10-CM | POA: Insufficient documentation

## 2021-02-13 DIAGNOSIS — Z955 Presence of coronary angioplasty implant and graft: Secondary | ICD-10-CM | POA: Diagnosis not present

## 2021-02-13 DIAGNOSIS — Z79899 Other long term (current) drug therapy: Secondary | ICD-10-CM | POA: Diagnosis not present

## 2021-02-13 DIAGNOSIS — E1122 Type 2 diabetes mellitus with diabetic chronic kidney disease: Secondary | ICD-10-CM | POA: Insufficient documentation

## 2021-02-13 DIAGNOSIS — Z8616 Personal history of COVID-19: Secondary | ICD-10-CM | POA: Diagnosis not present

## 2021-02-13 DIAGNOSIS — I130 Hypertensive heart and chronic kidney disease with heart failure and stage 1 through stage 4 chronic kidney disease, or unspecified chronic kidney disease: Secondary | ICD-10-CM | POA: Insufficient documentation

## 2021-02-13 DIAGNOSIS — R609 Edema, unspecified: Secondary | ICD-10-CM | POA: Diagnosis not present

## 2021-02-13 DIAGNOSIS — N184 Chronic kidney disease, stage 4 (severe): Secondary | ICD-10-CM | POA: Diagnosis not present

## 2021-02-13 DIAGNOSIS — Z95810 Presence of automatic (implantable) cardiac defibrillator: Secondary | ICD-10-CM | POA: Diagnosis not present

## 2021-02-13 DIAGNOSIS — I5042 Chronic combined systolic (congestive) and diastolic (congestive) heart failure: Secondary | ICD-10-CM | POA: Diagnosis not present

## 2021-02-13 DIAGNOSIS — Z7984 Long term (current) use of oral hypoglycemic drugs: Secondary | ICD-10-CM | POA: Diagnosis not present

## 2021-02-13 DIAGNOSIS — M542 Cervicalgia: Secondary | ICD-10-CM | POA: Diagnosis not present

## 2021-02-13 DIAGNOSIS — E042 Nontoxic multinodular goiter: Secondary | ICD-10-CM | POA: Diagnosis not present

## 2021-02-13 DIAGNOSIS — Z7982 Long term (current) use of aspirin: Secondary | ICD-10-CM | POA: Insufficient documentation

## 2021-02-13 DIAGNOSIS — M47812 Spondylosis without myelopathy or radiculopathy, cervical region: Secondary | ICD-10-CM | POA: Diagnosis not present

## 2021-02-13 DIAGNOSIS — I6523 Occlusion and stenosis of bilateral carotid arteries: Secondary | ICD-10-CM | POA: Diagnosis not present

## 2021-02-13 LAB — BASIC METABOLIC PANEL
Anion gap: 10 (ref 5–15)
BUN: 44 mg/dL — ABNORMAL HIGH (ref 8–23)
CO2: 27 mmol/L (ref 22–32)
Calcium: 9.3 mg/dL (ref 8.9–10.3)
Chloride: 101 mmol/L (ref 98–111)
Creatinine, Ser: 1.72 mg/dL — ABNORMAL HIGH (ref 0.44–1.00)
GFR, Estimated: 31 mL/min — ABNORMAL LOW (ref >60)
Glucose, Bld: 87 mg/dL (ref 70–99)
Potassium: 3.9 mmol/L (ref 3.5–5.1)
Sodium: 138 mmol/L (ref 135–145)

## 2021-02-13 LAB — CBC WITH DIFFERENTIAL/PLATELET
Abs Immature Granulocytes: 0.02 10*3/uL (ref 0.00–0.07)
Basophils Absolute: 0.1 10*3/uL (ref 0.0–0.1)
Basophils Relative: 1 %
Eosinophils Absolute: 0.1 10*3/uL (ref 0.0–0.5)
Eosinophils Relative: 2 %
HCT: 34.6 % — ABNORMAL LOW (ref 36.0–46.0)
Hemoglobin: 11.1 g/dL — ABNORMAL LOW (ref 12.0–15.0)
Immature Granulocytes: 0 %
Lymphocytes Relative: 18 %
Lymphs Abs: 1.3 10*3/uL (ref 0.7–4.0)
MCH: 29.6 pg (ref 26.0–34.0)
MCHC: 32.1 g/dL (ref 30.0–36.0)
MCV: 92.3 fL (ref 80.0–100.0)
Monocytes Absolute: 0.8 10*3/uL (ref 0.1–1.0)
Monocytes Relative: 10 %
Neutro Abs: 5.2 10*3/uL (ref 1.7–7.7)
Neutrophils Relative %: 69 %
Platelets: 277 10*3/uL (ref 150–400)
RBC: 3.75 MIL/uL — ABNORMAL LOW (ref 3.87–5.11)
RDW: 16.3 % — ABNORMAL HIGH (ref 11.5–15.5)
WBC: 7.6 10*3/uL (ref 4.0–10.5)
nRBC: 0 % (ref 0.0–0.2)

## 2021-02-13 MED ORDER — IOHEXOL 350 MG/ML SOLN
60.0000 mL | Freq: Once | INTRAVENOUS | Status: AC | PRN
  Administered 2021-02-13: 60 mL via INTRAVENOUS

## 2021-02-13 MED ORDER — OXYCODONE-ACETAMINOPHEN 5-325 MG PO TABS: 1.0000 | ORAL_TABLET | Freq: Three times a day (TID) | ORAL | 0 refills | Status: AC | PRN

## 2021-02-13 MED ORDER — OXYCODONE-ACETAMINOPHEN 5-325 MG PO TABS
1.0000 | ORAL_TABLET | Freq: Once | ORAL | Status: AC
  Administered 2021-02-13: 1 via ORAL
  Filled 2021-02-13: qty 1

## 2021-02-13 MED ORDER — PREDNISONE 10 MG PO TABS: 30.0000 mg | ORAL_TABLET | Freq: Every day | ORAL | 0 refills | Status: AC

## 2021-02-13 NOTE — Discharge Instructions (Addendum)
Lab work and imaging were reassuring.  Imaging does show that you have some arthritis in your neck which I suspect is causing your pain.  I have started you on steroids please take as prescribed.  I have also given you some pain medications, please do not take this medication with tramadol as it will make you very tired.  This medicine can make you drowsy do not consume alcohol or operate heavy machinery when taking this medication.  This medication has Tylenol in it do not take Tylenol when taking this medication.  Please follow-up with your primary care provider and/or neurosurgery for further evaluation of your neck pain.  Come back to the emergency department if you develop chest pain, shortness of breath, severe abdominal pain, uncontrolled nausea, vomiting, diarrhea.

## 2021-02-13 NOTE — ED Triage Notes (Signed)
C/o headache for 5 days, saw PCP for same and advised if it continued she needs a CT scan

## 2021-02-13 NOTE — ED Provider Notes (Signed)
[facility] EMERGENCY DEPARTMENT Provider Note   CSN: [id number] Arrival date & time: 02/13/21  1052     History Chief Complaint  Patient presents with   Headache    [name] [name] is a 75 y.o. female.  HPI  Patient with significant medical history of ICD, CHF, CKD, diabetes type 2, presents to the emergency department with chief complaint of a headache.  Patient states headache been going on for greater than 7 days, states headache came on suddenly, headache is felt on the back of her head, side of her head and forehead, states the pain is intermittent, states it only comes on when she has neck pain, denies change in vision, paresthesias, weakness in the upper and/or lower extremities, she denies recent head trauma, is not on anticoagulant, denies fevers, chills, has no history of IV drug use.  Patient states she has never had a headache like this past, states is worse when she moves her neck, states that she saw her primary care doctor on Monday who thinks is coming from her neck pain, started on a muscle relaxer without relief.  Patient denies any alleviating factors.  She does not endorse chest pain, shortness of breath, abdominal pain, nausea or vomiting.  Past Medical History:  Diagnosis Date   Acute hypoxemic respiratory failure due to COVID-19 ([place]) 06/29/2020   Anxiety    Aortic stenosis    Arthritis    Biventricular ICD (implantable cardioverter-defibrillator) in place    March 2021, [employer]  -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   23 mm Edwards Sapien 3 transcatheter heart valve placed via open right transfemoral approach   Symptomatic PVCs     Patient Active Problem List   Diagnosis Date Noted   Neck pain 02/08/2021   Cervicogenic headache 02/08/2021   Hospital discharge follow-up 07/16/2020   CKD (chronic kidney disease), stage IV ([place]CC[place]11/15/2021   Hyperlipidemia associated with type 2 diabetes mellitus (HCFort [place]11/15/2021   Benign essential hypertension 05/27/2020   Chronic combined systolic and diastolic heart failure (HCM[place]10/13/2021   Generalized osteoarthritis 07/26/2019   Unilateral primary osteoarthritis, left knee 03/16/2017   Annual physical exam 08/18/2015   History of aortic valve replacement 03/24/2015   Hyperlipidemia 05/24/2014   Severe aortic stenosis 05/23/2014   Solitary pulmonary nodule 02/10/2014   Left bundle-branch block 12/03/2013   Type 2 DM with CKD stage 4 and hypertension (HCBrim[place]10/02/2013   Seasonal allergies 02/22/2012   Cardiomyopathy (HCVilla [place]03/08/2011   Type 2 diabetes mellitus (HCWicons[place]03/06/2008   Body mass index (BMI) 26.0-26.9, adult 10/24/2007    Past Surgical History:  Procedure Laterality Date   BIV ICD INSERTION CRT-D N/A 10/28/2019   Procedure: BIV ICD INSERTION CRT-D;  Surgeon: TaEvans LanceMD;  [facility];  Service: Cardiovascular;  Laterality: N/A;  CARDIAC CATHETERIZATION     CESAREAN SECTION  1987   FLEXIBLE BRONCHOSCOPY N/A 01/14/2015   Procedure: FLEXIBLE BRONCHOSCOPY;  Surgeon: [name], MD;  Location: [place];  Service: Thoracic;  Laterality: N/A;   LEFT AND RIGHT HEART CATHETERIZATION WITH CORONARY ANGIOGRAM N/A 12/05/2011   Procedure: LEFT AND RIGHT HEART CATHETERIZATION WITH CORONARY ANGIOGRAM;  Surgeon: [name], MD;  Location: [facility] LAB;  Service: Cardiovascular;  Laterality: N/A;    LEFT AND RIGHT HEART CATHETERIZATION WITH CORONARY ANGIOGRAM N/A 05/23/2014   Procedure: LEFT AND RIGHT HEART CATHETERIZATION WITH CORONARY ANGIOGRAM;  Surgeon: [name], MD;  Location: [facility] CATH LAB;  Service: Cardiovascular;  Laterality: N/A;   MULTIPLE EXTRACTIONS WITH ALVEOLOPLASTY N/A 02/27/2014   Procedure: Extraction of tooth #'s 2,4,5,6,7,8,9,10,11,12, 22, 23, 24, 25, 26, 28 with alveoloplasty and bilateral mandibular tori reductions;  Surgeon: [name], DDS;  Location: [place];  Service: Oral Surgery;  Laterality: N/A;   RIGID BRONCHOSCOPY N/A 01/14/2015   Procedure: RIGID BRONCHOSCOPY with Removal Of Foreign Body ;  Surgeon: [name], MD;  Location: [place];  Service: Thoracic;  Laterality: N/A;   TEE WITHOUT CARDIOVERSION N/A 03/24/2015   Procedure: TRANSESOPHAGEAL ECHOCARDIOGRAM (TEE);  Surgeon: [name], MD;  Location: [place];  Service: Open Heart Surgery;  Laterality: N/A;   TRANSCATHETER AORTIC VALVE REPLACEMENT, TRANSFEMORAL Bilateral 03/24/2015   Procedure: TRANSCATHETER AORTIC VALVE REPLACEMENT, TRANSFEMORAL;  Surgeon: [name], MD;  Location: [place];  Service: Open Heart Surgery;  Laterality: Bilateral;   TUBAL LIGATION  1987   VIDEO BRONCHOSCOPY Bilateral 06/05/2014   Procedure: VIDEO BRONCHOSCOPY WITHOUT FLUORO;  Surgeon: [name], MD;  Location: [place];  Service: Cardiopulmonary;  Laterality: Bilateral;     OB History   No obstetric history on file.     Family History  Problem Relation Age of Onset   Heart disease Mother    Heart attack Mother 66   Diabetes Mother    Hypertension Mother    Colon cancer Father 49   Breast cancer Sister    Diabetes Brother    Hypertension Brother    Stroke Brother    Cancer Brother    Congestive Heart Failure Daughter    Hypertension Son    High Cholesterol Son    Congestive Heart Failure Son     Social History   Tobacco Use   Smoking status: Never    Smokeless tobacco: Never  Vaping Use   Vaping Use: Never used  Substance Use Topics   Alcohol use: No   Drug use: No    Home Medications Prior to Admission medications   Medication Sig Start Date End Date Taking? Authorizing Provider  oxyCODONE-acetaminophen (PERCOCET/ROXICET) 5-325 MG tablet Take 1 tablet by mouth every 8 (eight) hours as needed for up to 3 days for severe pain. 02/13/21 02/16/21 Yes Marcello Fennel, PA-C  predniSONE (DELTASONE) 10 MG tablet Take 3 tablets (30 mg total) by mouth daily for 4 days. 02/13/21 02/17/21 Yes Marcello Fennel, PA-C  acetaminophen (TYLENOL) 500 MG tablet Take one tablet two times daily, by mouth, for knee  pain Patient taking differently: Take 500 mg by mouth 2 (two) times daily as needed for moderate pain. Take one tablet two times daily, by mouth, for knee  pain 07/18/19   Fayrene Helper, MD  allopurinol (ZYLOPRIM) 100 MG tablet Take by mouth. 11/04/20 11/04/21  [provider]  aspirin EC 81 MG tablet Take 81 mg  by mouth every morning.     [provider]  blood glucose meter kit and supplies Dispense based on patient and insurance preference. Use to test three times daily (FOR ICD-10 E10.9, E11.9). 12/02/20   Fayrene Helper, MD  carvedilol (COREG) 25 MG tablet TAKE 1 TABLET BY MOUTH TWICE DAILY WITH MEALS. 02/05/21   [name], MD  cetirizine (ZYRTEC) 10 MG tablet Take 1 tablet (10 mg total) by mouth daily. 07/29/20 02/04/21  Lindell Spar, MD  cholecalciferol (VITAMIN D3) 25 MCG (1000 UNIT) tablet Take 1,000 Units by mouth daily.    [provider]  diclofenac Sodium (VOLTAREN) 1 % GEL Apply 1 application topically 4 (four) times daily as needed (pain).    [provider]  ezetimibe (ZETIA) 10 MG tablet TAKE ONE TABLET BY MOUTH ONCE DAILY. 01/18/21   Fayrene Helper, MD  fenofibrate (TRICOR) 145 MG tablet TAKE (1) TABLET BY MOUTH ONCE DAILY. 01/18/21   Fayrene Helper, MD  furosemide (LASIX)  40 MG tablet TAKE (1) TABLET BY MOUTH EACH MORNING. 02/05/21   Fayrene Helper, MD  glipiZIDE (GLUCOTROL XL) 2.5 MG 24 hr tablet TAKE ONE TABLET BY MOUTH ONCE DAILY WITH BREAKFAST. 01/26/21   Fayrene Helper, MD  Insulin Pen Needle (LITETOUCH PEN NEEDLES) 31G X 8 MM MISC Use to inject insulin twice daily Dx e11.65 12/09/20   Fayrene Helper, MD  Lancets [facility] DELICA PLUS [id number]) [place] USE 3 TIMES A DAY AS DIRECTED. 09/25/20   Fayrene Helper, MD  LIVALO 2 MG TABS TAKE ONE TABLET BY MOUTH ONCE DAILY AFTER SUPPER. 12/03/20   Noreene Larsson, NP  Multiple Vitamins-Minerals (MULTIVITAMIN PO) Take 1 tablet by mouth every morning.     [provider]  NOVOLOG MIX 70/30 FLEXPEN (70-30) 100 UNIT/ML FlexPen INJECT 12 TO 15 UNITS SUBCUTANEOUSLY TWICE DAILY. 02/05/21   Fayrene Helper, MD  [facility] ULTRA test strip CHECK BLOOD SUGAR 3 TIMES A DAY. 10/30/20   Fayrene Helper, MD  potassium chloride (KLOR-CON) 10 MEQ tablet Once daily 09/28/20   Fayrene Helper, MD  sacubitril-valsartan (ENTRESTO) 24-26 MG Take 1 tablet by mouth 2 (two) times daily. 12/24/20   [name], MD  spironolactone (ALDACTONE) 25 MG tablet Take 0.5 tablets (12.5 mg total) by mouth daily. 12/24/20   [name], MD  tiZANidine (ZANAFLEX) 4 MG tablet Take 1 tablet (4 mg total) by mouth every 6 (six) hours as needed for muscle spasms. 02/08/21   Noreene Larsson, NP  traMADol (ULTRAM) 50 MG tablet Take 1 tablet (50 mg total) by mouth every 8 (eight) hours as needed for up to 5 days. 02/08/21 02/13/21  Noreene Larsson, NP  UNABLE TO FIND Shower bench x 1   DX E33.29 05/02/19   Fayrene Helper, MD  UNABLE TO FIND Rolling walker with seat x 1  DX m17.12 05/02/19   Fayrene Helper, MD  ferrous sulfate 325 (65 FE) MG EC tablet Take 1 tablet (325 mg total) by mouth 2 (two) times daily. 07/14/11 10/28/11  Fayrene Helper, MD  FLUoxetine (PROZAC) 10 MG tablet Take 1 tablet (10 mg total) by mouth  daily. Take one capsule by mouth once a day 12/20/10 11/03/11  Fayrene Helper, MD    Allergies    Crestor [rosuvastatin], Tramadol, and Penicillins  Review of Systems   Review of Systems  Constitutional:  Negative for chills and fever.  HENT:  Negative  for congestion.   Respiratory:  Negative for shortness of breath.   Cardiovascular:  Negative for chest pain.  Gastrointestinal:  Negative for abdominal pain.  Genitourinary:  Negative for enuresis.  Musculoskeletal:  Positive for neck pain. Negative for back pain.  Skin:  Negative for rash.  Neurological:  Positive for headaches. Negative for dizziness.  Hematological:  Does not bruise/bleed easily.   Physical Exam Updated Vital Signs BP 122/64 (BP Location: Right Arm)   Pulse 77   Temp 99.4 F (37.4 C) (Oral)   Resp 18   SpO2 98%   Physical Exam Vitals and nursing note reviewed.  Constitutional:      General: She is not in acute distress.    Appearance: She is not ill-appearing.  HENT:     Head: Normocephalic and atraumatic.     Nose: No congestion.  Eyes:     Extraocular Movements: Extraocular movements intact.     Conjunctiva/sclera: Conjunctivae normal.     Pupils: Pupils are equal, round, and reactive to light.  Cardiovascular:     Rate and Rhythm: Normal rate and regular rhythm.     Pulses: Normal pulses.     Heart sounds: No murmur heard.   No friction rub. No gallop.  Pulmonary:     Effort: No respiratory distress.     Breath sounds: No wheezing, rhonchi or rales.  Musculoskeletal:     Right lower leg: Edema present.     Left lower leg: Edema present.     Comments: Spine was palpated she has tenderness along her cervical spine, no focal point of tenderness, no deformities present.  Able to move her upper and lower extremities without difficulty, 5 5 strength neurovascular fully intact.  Skin:    General: Skin is warm and dry.  Neurological:     Mental Status: She is alert.     GCS: GCS eye subscore is  4. GCS verbal subscore is 5. GCS motor subscore is 6.     Cranial Nerves: No cranial nerve deficit or facial asymmetry.     Motor: No weakness.     Coordination: Romberg sign negative. Finger-Nose-Finger Test normal.     Gait: Gait normal.     Comments: Cranial nerves II through XII grossly intact patient is having no difficulty with word finding.  Psychiatric:        Mood and Affect: Mood normal.    ED Results / Procedures / Treatments   Labs (all labs ordered are listed, but only abnormal results are displayed) Labs Reviewed  BASIC METABOLIC PANEL - Abnormal; Notable for the following components:      Result Value   BUN 44 (*)    Creatinine, Ser 1.72 (*)    GFR, Estimated 31 (*)    All other components within normal limits  CBC WITH DIFFERENTIAL/PLATELET - Abnormal; Notable for the following components:   RBC 3.75 (*)    Hemoglobin 11.1 (*)    HCT 34.6 (*)    RDW 16.3 (*)    All other components within normal limits    EKG None  Radiology CT Angio Head W or Wo Contrast  Result Date: 02/13/2021 CLINICAL DATA:  Right-sided neck pain EXAM: CT ANGIOGRAPHY HEAD AND NECK TECHNIQUE: Multidetector CT imaging of the head and neck was performed using the standard protocol during bolus administration of intravenous contrast. Multiplanar CT image reconstructions and MIPs were obtained to evaluate the vascular anatomy. Carotid stenosis measurements (when applicable) are obtained utilizing NASCET criteria, using the  distal internal carotid diameter as the denominator. CONTRAST:  52m OMNIPAQUE IOHEXOL 350 MG/ML SOLN COMPARISON:  None. FINDINGS: CT HEAD Brain: There is no acute intracranial hemorrhage, mass effect, or edema. Gray-white differentiation is preserved. There is no extra-axial fluid collection. Prominence of the ventricles and sulci reflects generalized parenchymal volume loss. Vascular: Better evaluated on CTA portion. Skull: Calvarium is unremarkable. Sinuses/Orbits: No acute  finding. Other: None. Review of the MIP images confirms the above findings CTA NECK Aortic arch: Great vessels origins are patent. Right carotid system: Patent. Mild mixed plaque at the ICA origin without stenosis. Left carotid system: Patent. Mild mixed plaque at the ICA origin without stenosis. Vertebral arteries: Patent and codominant. No stenosis or evidence of dissection. Skeleton: Degenerative changes of the cervical spine, greatest at C5-C6. Multilevel facet hypertrophy. Other neck: Subcentimeter thyroid nodules. No further ultrasound follow-up is recommended by current guidelines. Upper chest: No apical lung mass. Patchy atelectasis. Left chest wall ICD. Review of the MIP images confirms the above findings CTA HEAD Anterior circulation: Intracranial internal carotid arteries are patent with calcified plaque but no significant stenosis. Anterior and middle cerebral arteries are patent. Posterior circulation: Intracranial vertebral arteries are patent. Basilar artery is patent. Posterior cerebral arteries are patent. Venous sinuses: Patent as allowed by contrast bolus timing. Review of the MIP images confirms the above findings IMPRESSION: No acute intracranial abnormality. No large vessel occlusion, hemodynamically significant stenosis, or evidence of dissection. Electronically Signed   By: PMacy MisM.D.   On: 02/13/2021 16:39   CT Angio Neck W and/or Wo Contrast  Result Date: 02/13/2021 CLINICAL DATA:  Right-sided neck pain EXAM: CT ANGIOGRAPHY HEAD AND NECK TECHNIQUE: Multidetector CT imaging of the head and neck was performed using the standard protocol during bolus administration of intravenous contrast. Multiplanar CT image reconstructions and MIPs were obtained to evaluate the vascular anatomy. Carotid stenosis measurements (when applicable) are obtained utilizing NASCET criteria, using the distal internal carotid diameter as the denominator. CONTRAST:  68mOMNIPAQUE IOHEXOL 350 MG/ML SOLN  COMPARISON:  None. FINDINGS: CT HEAD Brain: There is no acute intracranial hemorrhage, mass effect, or edema. Gray-white differentiation is preserved. There is no extra-axial fluid collection. Prominence of the ventricles and sulci reflects generalized parenchymal volume loss. Vascular: Better evaluated on CTA portion. Skull: Calvarium is unremarkable. Sinuses/Orbits: No acute finding. Other: None. Review of the MIP images confirms the above findings CTA NECK Aortic arch: Great vessels origins are patent. Right carotid system: Patent. Mild mixed plaque at the ICA origin without stenosis. Left carotid system: Patent. Mild mixed plaque at the ICA origin without stenosis. Vertebral arteries: Patent and codominant. No stenosis or evidence of dissection. Skeleton: Degenerative changes of the cervical spine, greatest at C5-C6. Multilevel facet hypertrophy. Other neck: Subcentimeter thyroid nodules. No further ultrasound follow-up is recommended by current guidelines. Upper chest: No apical lung mass. Patchy atelectasis. Left chest wall ICD. Review of the MIP images confirms the above findings CTA HEAD Anterior circulation: Intracranial internal carotid arteries are patent with calcified plaque but no significant stenosis. Anterior and middle cerebral arteries are patent. Posterior circulation: Intracranial vertebral arteries are patent. Basilar artery is patent. Posterior cerebral arteries are patent. Venous sinuses: Patent as allowed by contrast bolus timing. Review of the MIP images confirms the above findings IMPRESSION: No acute intracranial abnormality. No large vessel occlusion, hemodynamically significant stenosis, or evidence of dissection. Electronically Signed   By: PrMacy Mis.D.   On: 02/13/2021 16:39    Procedures Procedures  Medications Ordered in ED Medications  oxyCODONE-acetaminophen (PERCOCET/ROXICET) 5-325 MG per tablet 1 tablet (1 tablet Oral Given 02/13/21 1407)  iohexol (OMNIPAQUE) 350  MG/ML injection 60 mL (60 mLs Intravenous Contrast Given 02/13/21 1600)    ED Course  I have reviewed the triage vital signs and the nursing notes.  Pertinent labs & imaging results that were available during my care of the patient were reviewed by me and considered in my medical decision making (see chart for details).    MDM Rules/Calculators/A&P                         Initial impression-patient presents with neck and headaches.  She is alert, does not appear acute stress, vital signs reassuring.  I am concerned for possible vertebral dissection will obtain CTA head and neck for further evaluation.  Work-up-CBC shows normocytic anemia hemoglobin 11.1, BMP shows creatinine 1.72 GFR 31 appears to be at her baseline.  CT head and neck both negative for acute findings, does show degeneration changes at C5 and C6.  Reassessment-patient was assessed after oxycodone, states she feels much better, has no complaints at this time.  Patient is in agreement with discharge.  Rule out-low suspicion for intracranial head bleed and/or mass as CT of the head neck both negative for acute findings.  Low suspicion for dissection of the vertebral or carotid arteries as imaging is also negative for these findings.  Low suspicion for CVA as patient denies dizziness, change in vision, paresthesia or weakness in the upper or lower extremities, no neurodeficits present my exam.  Low suspicion for meningitis as there is no meningeal sign present, no leukocytosis on CBC.  Plan-  Head and neck pain since resolved-I suspect pain is secondary from degeneration changes in the C-spine seen on imaging.  Will provide patient with steroids, short course of narcotics, follow-up with neurosurgery for further evaluation.  Vital signs have remained stable, no indication for hospital admission.  Patient discussed with attending and they agreed with assessment and plan.  Patient given at home care as well strict return precautions.   Patient verbalized that they understood agreed to said plan.  Final Clinical Impression(s) / ED Diagnoses Final diagnoses:  None    Rx / DC Orders ED Discharge Orders          Ordered    predniSONE (DELTASONE) 10 MG tablet  Daily        02/13/21 1652    oxyCODONE-acetaminophen (PERCOCET/ROXICET) 5-325 MG tablet  Every 8 hours PRN        02/13/21 1652             Marcello Fennel, PA-C 02/13/21 1654    Wyvonnia Dusky, MD 02/13/21 [id number]

## 2021-02-16 NOTE — Progress Notes (Signed)
Remote ICD transmission.   

## 2021-02-18 ENCOUNTER — Ambulatory Visit: Payer: PPO | Admitting: Orthopedic Surgery

## 2021-02-19 ENCOUNTER — Other Ambulatory Visit: Payer: Self-pay | Admitting: Family Medicine

## 2021-02-22 ENCOUNTER — Other Ambulatory Visit (HOSPITAL_COMMUNITY): Payer: PPO

## 2021-02-24 ENCOUNTER — Other Ambulatory Visit: Payer: Self-pay

## 2021-02-24 ENCOUNTER — Ambulatory Visit (INDEPENDENT_AMBULATORY_CARE_PROVIDER_SITE_OTHER): Payer: PPO | Admitting: Family Medicine

## 2021-02-24 ENCOUNTER — Encounter: Payer: Self-pay | Admitting: Family Medicine

## 2021-02-24 VITALS — BP 108/54 | HR 86 | Temp 99.7°F | Resp 18 | Ht 65.0 in | Wt 180.0 lb

## 2021-02-24 DIAGNOSIS — I129 Hypertensive chronic kidney disease with stage 1 through stage 4 chronic kidney disease, or unspecified chronic kidney disease: Secondary | ICD-10-CM | POA: Diagnosis not present

## 2021-02-24 DIAGNOSIS — I5042 Chronic combined systolic (congestive) and diastolic (congestive) heart failure: Secondary | ICD-10-CM | POA: Diagnosis not present

## 2021-02-24 DIAGNOSIS — N184 Chronic kidney disease, stage 4 (severe): Secondary | ICD-10-CM

## 2021-02-24 DIAGNOSIS — E1122 Type 2 diabetes mellitus with diabetic chronic kidney disease: Secondary | ICD-10-CM

## 2021-02-24 DIAGNOSIS — E785 Hyperlipidemia, unspecified: Secondary | ICD-10-CM

## 2021-02-24 DIAGNOSIS — E1169 Type 2 diabetes mellitus with other specified complication: Secondary | ICD-10-CM | POA: Diagnosis not present

## 2021-02-24 DIAGNOSIS — I10 Essential (primary) hypertension: Secondary | ICD-10-CM

## 2021-02-24 DIAGNOSIS — Z1211 Encounter for screening for malignant neoplasm of colon: Secondary | ICD-10-CM

## 2021-02-24 DIAGNOSIS — M159 Polyosteoarthritis, unspecified: Secondary | ICD-10-CM | POA: Diagnosis not present

## 2021-02-24 LAB — POCT HEMOGLOBIN: Hemoglobin: 7.2 g/dL — AB (ref 11–14.6)

## 2021-02-24 MED ORDER — PREDNISONE 5 MG PO TABS: 5.0000 mg | ORAL_TABLET | Freq: Two times a day (BID) | ORAL | 0 refills | Status: AC

## 2021-02-24 NOTE — Patient Instructions (Addendum)
Follow-up in 4 months call if you need me sooner.  Glycohemoglobin in office today.  5-day course of prednisone is prescribed for the acute arthritic pain.  Please get fasting lipids CMP and EGFR and uric acid level 3 to 5 days prior to next visit.  Blood sugar is excellent continue current medication dose.  Congratulations.  Please arrange for Cologuard test to be done in the month of August.  Please encourage patient to get her bone density test and schedule if she agrees. Thanks for choosing [facility], we consider it a privelige to serve you.  

## 2021-02-25 ENCOUNTER — Encounter: Payer: Self-pay | Admitting: Family Medicine

## 2021-02-25 NOTE — Assessment & Plan Note (Signed)
Hyperlipidemia:Low fat diet discussed and encouraged.   Lipid Panel  Lab Results  Component Value Date   CHOL 171 10/27/2020   HDL 45 10/27/2020   LDLCALC 107 (H) 10/27/2020   TRIG 106 10/27/2020   CHOLHDL 3.8 10/27/2020  nee to lower fat intake , not at goal Updated lab needed at/ before next visit.

## 2021-02-25 NOTE — Assessment & Plan Note (Signed)
Improved and controlled, no med change [name] [name] is reminded of the importance of commitment to daily physical activity for 30 minutes or more, as able and the need to limit carbohydrate intake to 30 to 60 grams per meal to help with blood sugar control.   The need to take medication as prescribed, test blood sugar as directed, and to call between visits if there is a concern that blood sugar is uncontrolled is also discussed.   [name] [name] is reminded of the importance of daily foot exam, annual eye examination, and good blood sugar, blood pressure and cholesterol control.  Diabetic Labs Latest Ref Rng & Units 02/13/2021 01/04/2021 12/22/2020 10/27/2020 07/20/2020  HbA1c 4.8 - 5.6 % - - - 7.6(H) -  Microalbumin mg/dL - - - - -  Micro/Creat Ratio <30 mcg/mg creat - - - - -  Chol 100 - 199 mg/dL - - - 171 -  HDL >39 mg/dL - - - 45 -  Calc LDL 0 - 99 mg/dL - - - 107(H) -  Triglycerides 0 - 149 mg/dL - - - 106 -  Creatinine 0.44 - 1.00 mg/dL 1.72(H) 1.87(H) 1.69(H) 1.80(H) 1.43(H)   BP/Weight 02/24/2021 02/13/2021 02/08/2021 02/05/2021 02/04/2021 01/13/2021 [id number]  Systolic BP 123XX123 [id number]  Diastolic BP 54 64 66 70 58 64 65  Wt. (Lbs) 180 - 138 181 181 181 177.2  BMI 29.95 - 22.96 30.12 30.12 30.12 28.6   Foot/eye exam completion dates Latest Ref Rng & Units 08/22/2019 07/31/2017  Eye Exam No Retinopathy - -  Foot exam Order - - -  Foot Form Completion - Done Done

## 2021-02-25 NOTE — Assessment & Plan Note (Signed)
Stable , not decompensated, managed by Cardiology

## 2021-02-25 NOTE — Assessment & Plan Note (Signed)
Increased and uncontrolled generalized pain, 5 days course of prednisone prescribed Home safety and use of cane discussed and encouraged

## 2021-02-25 NOTE — Progress Notes (Signed)
[name] [name]     MRN: [id number]      DOB: 06-28-1946   HPI [name] [name] is here for follow up and re-evaluation of chronic medical conditions, medication management and review of any available recent lab and radiology data.  Preventive health is updated, specifically  Cancer screening and Immunization.   Questions or concerns regarding consultations or procedures which the PT has had in the interim are  addressed. The PT denies any adverse reactions to current medications since the last visit.  There are no specific complaints   ROS Denies recent fever or chills. Denies sinus pressure, nasal congestion, ear pain or sore throat. Denies chest congestion, productive cough or wheezing. Denies chest pains, palpitations and leg swelling Denies abdominal pain, nausea, vomiting,diarrhea or constipation.   Denies dysuria, frequency,. C/o incrased generalized  joint pain, swelling and limitation in mobility. Denies headaches, seizures, numbness, or tingling. Denies depression, anxiety or insomnia. Denies skin break down or rash.   PE  BP (!) 108/54 (BP Location: Left Arm, Patient Position: Sitting, Cuff Size: Large)   Pulse 86   Temp 99.7 F (37.6 C)   Resp 18   Ht '5\' 5"'$  (1.651 m)   Wt 180 lb (81.6 kg)   SpO2 96%   BMI 29.95 kg/m   Patient alert and oriented and in no cardiopulmonary distress.  HEENT: No facial asymmetry, EOMI,     Neck decreased ROM .  Chest: Clear to auscultation bilaterally.  CVS: S1, S2 systolic murmurs no S3.Regular rate.  ABD: Soft non tender.   Ext: No edema  MS: decreased  ROM spine, shoulders, hips and knees.  Skin: Intact, no ulcerations or rash noted.  Psych: Good eye contact, normal affect. Memory intact not anxious or depressed appearing.  CNS: CN 2-12 intact, power,  normal throughout.no focal deficits noted.   Assessment & Plan  Benign essential hypertension Controlled, no change in medication DASH diet and commitment to daily  physical activity for a minimum of 30 minutes discussed and encouraged, as a part of hypertension management. The importance of attaining a healthy weight is also discussed.  BP/Weight 02/24/2021 02/13/2021 02/08/2021 02/05/2021 02/04/2021 01/13/2021 99991111  Systolic BP 123XX123 123XX123 [id number] [id number]  Diastolic BP 54 64 66 70 58 64 65  Wt. (Lbs) 180 - 138 181 181 181 177.2  BMI 29.95 - 22.96 30.12 30.12 30.12 28.6       Chronic combined systolic and diastolic heart failure (HCC) Stable , not decompensated, managed by Cardiology  Generalized osteoarthritis Increased and uncontrolled generalized pain, 5 days course of prednisone prescribed Home safety and use of cane discussed and encouraged  Hyperlipidemia associated with type 2 diabetes mellitus ([place]) Hyperlipidemia:Low fat diet discussed and encouraged.   Lipid Panel  Lab Results  Component Value Date   CHOL 171 10/27/2020   HDL 45 10/27/2020   LDLCALC 107 (H) 10/27/2020   TRIG 106 10/27/2020   CHOLHDL 3.8 10/27/2020  nee to lower fat intake , not at goal Updated lab needed at/ before next visit.      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 is reminded of the importance of commitment to daily physical activity for 30 minutes or more, as able and the need to limit carbohydrate intake to 30 to 60 grams per meal to help with blood sugar control.   The need to take medication as prescribed, test blood sugar as directed,  and to call between visits if there is a concern that blood sugar is uncontrolled is also discussed.   [name] is reminded of the importance of daily foot exam, annual eye examination, and good blood sugar, blood pressure and cholesterol control.  Diabetic Labs Latest Ref Rng & Units 02/13/2021 01/04/2021 12/22/2020 10/27/2020 07/20/2020  HbA1c 4.8 - 5.6 % - - - 7.6(H) -  Microalbumin mg/dL - - - - -  Micro/Creat Ratio <30 mcg/mg creat - - - - -  Chol  100 - 199 mg/dL - - - 171 -  HDL >39 mg/dL - - - 45 -  Calc LDL 0 - 99 mg/dL - - - 107(H) -  Triglycerides 0 - 149 mg/dL - - - 106 -  Creatinine 0.44 - 1.00 mg/dL 1.72(H) 1.87(H) 1.69(H) 1.80(H) 1.43(H)   BP/Weight 02/24/2021 02/13/2021 02/08/2021 02/05/2021 02/04/2021 01/13/2021 [id number]  Systolic BP 123XX123 123XX123 [id number] [id number] [id number]  Diastolic BP 54 64 66 70 58 64 65  Wt. (Lbs) 180 - 138 181 181 181 177.2  BMI 29.95 - 22.96 30.12 30.12 30.12 28.6   Foot/eye exam completion dates Latest Ref Rng & Units 08/22/2019 07/31/2017  Eye Exam No Retinopathy - -  Foot exam Order - - -  Foot Form Completion - Done Done

## 2021-02-25 NOTE — Assessment & Plan Note (Signed)
Controlled, no change in medication DASH diet and commitment to daily physical activity for a minimum of 30 minutes discussed and encouraged, as a part of hypertension management. The importance of attaining a healthy weight is also discussed.  BP/Weight 02/24/2021 02/13/2021 02/08/2021 02/05/2021 02/04/2021 01/13/2021 99991111  Systolic BP 123XX123 123XX123 [id number] Q000111Q [id number] [id number]  Diastolic BP 54 64 66 70 58 64 65  Wt. (Lbs) 180 - 138 181 181 181 177.2  BMI 29.95 - 22.96 30.12 30.12 30.12 28.6

## 2021-03-05 ENCOUNTER — Other Ambulatory Visit: Payer: Self-pay | Admitting: Nurse Practitioner

## 2021-03-11 ENCOUNTER — Other Ambulatory Visit: Payer: Self-pay | Admitting: Family Medicine

## 2021-03-19 ENCOUNTER — Other Ambulatory Visit: Payer: Self-pay | Admitting: Family Medicine

## 2021-03-22 ENCOUNTER — Other Ambulatory Visit: Payer: Self-pay | Admitting: Family Medicine

## 2021-04-06 ENCOUNTER — Other Ambulatory Visit: Payer: Self-pay | Admitting: Family Medicine

## 2021-04-09 ENCOUNTER — Other Ambulatory Visit: Payer: Self-pay | Admitting: Family Medicine

## 2021-04-19 ENCOUNTER — Other Ambulatory Visit: Payer: Self-pay | Admitting: Family Medicine

## 2021-04-21 ENCOUNTER — Other Ambulatory Visit: Payer: Self-pay | Admitting: Family Medicine

## 2021-04-21 DIAGNOSIS — I5042 Chronic combined systolic (congestive) and diastolic (congestive) heart failure: Secondary | ICD-10-CM | POA: Diagnosis not present

## 2021-04-21 DIAGNOSIS — N189 Chronic kidney disease, unspecified: Secondary | ICD-10-CM | POA: Diagnosis not present

## 2021-04-21 DIAGNOSIS — I129 Hypertensive chronic kidney disease with stage 1 through stage 4 chronic kidney disease, or unspecified chronic kidney disease: Secondary | ICD-10-CM | POA: Diagnosis not present

## 2021-04-21 DIAGNOSIS — E559 Vitamin D deficiency, unspecified: Secondary | ICD-10-CM | POA: Diagnosis not present

## 2021-04-21 DIAGNOSIS — E1122 Type 2 diabetes mellitus with diabetic chronic kidney disease: Secondary | ICD-10-CM | POA: Diagnosis not present

## 2021-04-26 ENCOUNTER — Ambulatory Visit (INDEPENDENT_AMBULATORY_CARE_PROVIDER_SITE_OTHER): Payer: PPO

## 2021-04-26 DIAGNOSIS — I428 Other cardiomyopathies: Secondary | ICD-10-CM

## 2021-04-26 LAB — CUP PACEART REMOTE DEVICE CHECK
Battery Remaining Longevity: 93 mo
Battery Voltage: 2.98 V
Brady Statistic AP VP Percent: 3.85 %
Brady Statistic AP VS Percent: 0.17 %
Brady Statistic AS VP Percent: 94.04 %
Brady Statistic AS VS Percent: 1.95 %
Brady Statistic RA Percent Paced: 3.97 %
Brady Statistic RV Percent Paced: 0.38 %
Date Time Interrogation Session: 20220912033524
HighPow Impedance: 64 Ohm
Implantable Lead Implant Date: 20210315
Implantable Lead Implant Date: 20210315
Implantable Lead Implant Date: 20210315
Implantable Lead Location: 753858
Implantable Lead Location: 753859
Implantable Lead Location: 753860
Implantable Lead Model: 4598
Implantable Lead Model: 5076
Implantable Lead Model: 6935
Implantable Pulse Generator Implant Date: 20210315
Lead Channel Impedance Value: 176 Ohm
Lead Channel Impedance Value: 182.4 Ohm
Lead Channel Impedance Value: 193.455
Lead Channel Impedance Value: 234.08 Ohm
Lead Channel Impedance Value: 245.538
Lead Channel Impedance Value: 304 Ohm
Lead Channel Impedance Value: 361 Ohm
Lead Channel Impedance Value: 399 Ohm
Lead Channel Impedance Value: 418 Ohm
Lead Channel Impedance Value: 418 Ohm
Lead Channel Impedance Value: 456 Ohm
Lead Channel Impedance Value: 532 Ohm
Lead Channel Impedance Value: 551 Ohm
Lead Channel Impedance Value: 646 Ohm
Lead Channel Impedance Value: 665 Ohm
Lead Channel Impedance Value: 779 Ohm
Lead Channel Impedance Value: 874 Ohm
Lead Channel Impedance Value: 874 Ohm
Lead Channel Pacing Threshold Amplitude: 0.5 V
Lead Channel Pacing Threshold Amplitude: 0.75 V
Lead Channel Pacing Threshold Amplitude: 0.75 V
Lead Channel Pacing Threshold Pulse Width: 0.4 ms
Lead Channel Pacing Threshold Pulse Width: 0.4 ms
Lead Channel Pacing Threshold Pulse Width: 0.4 ms
Lead Channel Sensing Intrinsic Amplitude: 17.125 mV
Lead Channel Sensing Intrinsic Amplitude: 17.125 mV
Lead Channel Sensing Intrinsic Amplitude: 3.375 mV
Lead Channel Sensing Intrinsic Amplitude: 3.375 mV
Lead Channel Setting Pacing Amplitude: 1.5 V
Lead Channel Setting Pacing Amplitude: 2 V
Lead Channel Setting Pacing Amplitude: 2.25 V
Lead Channel Setting Pacing Pulse Width: 0.4 ms
Lead Channel Setting Pacing Pulse Width: 0.4 ms
Lead Channel Setting Sensing Sensitivity: 0.3 mV

## 2021-05-04 ENCOUNTER — Other Ambulatory Visit: Payer: Self-pay | Admitting: Family Medicine

## 2021-05-04 NOTE — Progress Notes (Signed)
Remote ICD transmission.   

## 2021-05-18 ENCOUNTER — Other Ambulatory Visit: Payer: Self-pay | Admitting: Family Medicine

## 2021-05-19 DIAGNOSIS — E1122 Type 2 diabetes mellitus with diabetic chronic kidney disease: Secondary | ICD-10-CM | POA: Diagnosis not present

## 2021-05-19 DIAGNOSIS — E611 Iron deficiency: Secondary | ICD-10-CM | POA: Diagnosis not present

## 2021-05-19 DIAGNOSIS — E559 Vitamin D deficiency, unspecified: Secondary | ICD-10-CM | POA: Diagnosis not present

## 2021-05-19 DIAGNOSIS — N189 Chronic kidney disease, unspecified: Secondary | ICD-10-CM | POA: Diagnosis not present

## 2021-05-19 DIAGNOSIS — I5042 Chronic combined systolic (congestive) and diastolic (congestive) heart failure: Secondary | ICD-10-CM | POA: Diagnosis not present

## 2021-05-19 DIAGNOSIS — I129 Hypertensive chronic kidney disease with stage 1 through stage 4 chronic kidney disease, or unspecified chronic kidney disease: Secondary | ICD-10-CM | POA: Diagnosis not present

## 2021-05-27 ENCOUNTER — Other Ambulatory Visit: Payer: Self-pay

## 2021-05-27 ENCOUNTER — Ambulatory Visit (INDEPENDENT_AMBULATORY_CARE_PROVIDER_SITE_OTHER): Payer: PPO | Admitting: Orthopedic Surgery

## 2021-05-27 ENCOUNTER — Encounter: Payer: Self-pay | Admitting: Orthopedic Surgery

## 2021-05-27 VITALS — BP 120/64 | HR 77 | Ht 65.0 in | Wt 180.0 lb

## 2021-05-27 DIAGNOSIS — M1A0610 Idiopathic chronic gout, right knee, without tophus (tophi): Secondary | ICD-10-CM

## 2021-05-27 DIAGNOSIS — M25562 Pain in left knee: Secondary | ICD-10-CM | POA: Diagnosis not present

## 2021-05-27 DIAGNOSIS — G8929 Other chronic pain: Secondary | ICD-10-CM | POA: Diagnosis not present

## 2021-05-27 DIAGNOSIS — M25561 Pain in right knee: Secondary | ICD-10-CM | POA: Diagnosis not present

## 2021-05-27 MED ORDER — COLCHICINE 0.6 MG PO TABS
0.6000 mg | ORAL_TABLET | Freq: Two times a day (BID) | ORAL | 5 refills | Status: DC
Start: 2021-05-27 — End: 2022-07-20

## 2021-05-27 NOTE — Patient Instructions (Signed)
Take allopurinol and colchicine   You have received an injection of steroids into the joint. 15% of patients will have increased pain within the 24 hours postinjection.   This is transient and will go away.   We recommend that you use ice packs on the injection site for 20 minutes every 2 hours and extra strength Tylenol 2 tablets every 8 as needed until the pain resolves.  If you continue to have pain after taking the Tylenol and using the ice please call the office for further instructions.

## 2021-05-27 NOTE — Progress Notes (Signed)
FOLLOW UP   Encounter Diagnoses  Name Primary?   Chronic pain of left knee Yes   Chronic pain of right knee    Idiopathic chronic gout of right knee without tophus      Chief Complaint  Patient presents with   Knee Pain    Left/ increased pain has some right knee pain but not as bad as left   Medication Refill    Needs refill Colchicine     [name] has chronic pain in her left and right knee left is now more symptomatic  She has run out of her colchicine she still takes allopurinol  Examination of the knee reveals a swollen joint or warmth fairly good range of motion tenderness in the lateral compartment  Note injection and refilled the colchicine  Procedure note  Injection  Verbal consent was obtained to inject the left knee joint  Timeout procedure was completed to confirm injection site  Diagnosis chronic pain left knee osteoarthritis left knee gout  Medications used Celestone Lidocaine 1% plain 3 cc  Anesthesia was provided by ethyl chloride spray  Prep was performed with alcohol  Technique of injection the knee was in flexion lateral approach was used 20-gauge needle used to inject the knee  No complications were noted  Follow-up as needed continue with the colchicine and allopurinol

## 2021-06-03 ENCOUNTER — Other Ambulatory Visit: Payer: Self-pay | Admitting: Family Medicine

## 2021-06-09 ENCOUNTER — Other Ambulatory Visit: Payer: Self-pay | Admitting: Nurse Practitioner

## 2021-06-09 ENCOUNTER — Other Ambulatory Visit: Payer: Self-pay | Admitting: Family Medicine

## 2021-06-15 ENCOUNTER — Ambulatory Visit: Payer: PPO | Admitting: Cardiology

## 2021-06-18 ENCOUNTER — Other Ambulatory Visit: Payer: Self-pay | Admitting: Family Medicine

## 2021-06-28 ENCOUNTER — Ambulatory Visit: Payer: PPO | Admitting: Family Medicine

## 2021-07-01 ENCOUNTER — Telehealth: Payer: PPO | Admitting: Family Medicine

## 2021-07-01 ENCOUNTER — Other Ambulatory Visit: Payer: Self-pay

## 2021-07-05 ENCOUNTER — Other Ambulatory Visit: Payer: Self-pay | Admitting: Family Medicine

## 2021-07-07 ENCOUNTER — Other Ambulatory Visit: Payer: Self-pay

## 2021-07-07 ENCOUNTER — Ambulatory Visit (INDEPENDENT_AMBULATORY_CARE_PROVIDER_SITE_OTHER): Payer: PPO | Admitting: Family Medicine

## 2021-07-07 ENCOUNTER — Encounter: Payer: Self-pay | Admitting: Family Medicine

## 2021-07-07 VITALS — BP 109/63 | HR 84 | Resp 17 | Ht 66.0 in | Wt 187.1 lb

## 2021-07-07 DIAGNOSIS — N184 Chronic kidney disease, stage 4 (severe): Secondary | ICD-10-CM

## 2021-07-07 DIAGNOSIS — I10 Essential (primary) hypertension: Secondary | ICD-10-CM | POA: Diagnosis not present

## 2021-07-07 DIAGNOSIS — I129 Hypertensive chronic kidney disease with stage 1 through stage 4 chronic kidney disease, or unspecified chronic kidney disease: Secondary | ICD-10-CM

## 2021-07-07 DIAGNOSIS — Z23 Encounter for immunization: Secondary | ICD-10-CM

## 2021-07-07 DIAGNOSIS — E785 Hyperlipidemia, unspecified: Secondary | ICD-10-CM

## 2021-07-07 DIAGNOSIS — I5042 Chronic combined systolic (congestive) and diastolic (congestive) heart failure: Secondary | ICD-10-CM

## 2021-07-07 DIAGNOSIS — E1169 Type 2 diabetes mellitus with other specified complication: Secondary | ICD-10-CM

## 2021-07-07 DIAGNOSIS — E1122 Type 2 diabetes mellitus with diabetic chronic kidney disease: Secondary | ICD-10-CM

## 2021-07-07 DIAGNOSIS — M159 Polyosteoarthritis, unspecified: Secondary | ICD-10-CM | POA: Diagnosis not present

## 2021-07-07 LAB — POCT GLYCOSYLATED HEMOGLOBIN (HGB A1C): HbA1c, POC (controlled diabetic range): 7 % (ref 0.0–7.0)

## 2021-07-07 NOTE — Patient Instructions (Addendum)
Annual exam in March, call if you need me sooner  Flu vaccine today  Please re condsider the covid vaccine  Please add to schedul for diabetic eye exam this week in office   GlycoHb in office today, improved at a 7.0, great , keep this up  Tylenol 500 mg one to two daily for arthritis   Fating lipid, cmp and eGFr , hBA1C and microalb 3 to 5 days before March appointment  Careful not to fall!  Thanks for choosing [facility], we consider it a privelige to serve you.

## 2021-07-12 ENCOUNTER — Encounter: Payer: Self-pay | Admitting: Family Medicine

## 2021-07-12 NOTE — Progress Notes (Signed)
[name] [name]     MRN: [id number]      DOB: 06/08/1946   HPI [name] [name] is here for follow up and re-evaluation of chronic medical conditions, medication management and review of any available recent lab and radiology data.  Preventive health is updated, specifically  Cancer screening and Immunization.   Questions or concerns regarding consultations or procedures which the PT has had in the interim are  addressed. The PT denies any adverse reactions to current medications since the last visit.  There are no new concerns.  Denies polyuria, polydipsia, blurred vision , or hypoglycemic episodes. Generally feels as though she is doing well C/o arthritic pain which is not new  ROS Denies recent fever or chills. Denies sinus pressure, nasal congestion, ear pain or sore throat. Denies chest congestion, productive cough or wheezing. Denies chest pains, palpitations and leg swelling Denies abdominal pain, nausea, vomiting,diarrhea or constipation.   Denies dysuria, frequency, hesitancy or incontinence. C/o  joint pain, swelling and limitation in mobility. Denies headaches, seizures, numbness, or tingling. Denies depression, anxiety or insomnia. Denies skin break down or rash.   PE  BP 109/63   Pulse 84   Resp 17   Ht 5\' 6"  (1.676 m)   Wt 187 lb 1.9 oz (84.9 kg)   SpO2 97%   BMI 30.20 kg/m   Patient alert and oriented and in no cardiopulmonary distress.  HEENT: No facial asymmetry, EOMI,     Neck supple .  Chest: Clear to auscultation bilaterally.  CVS: S1, S2  murmurpresent, no S3.Regular rate.  ABD: Soft non tender.   Ext: No edema  MS: Decreased  ROM spine, shoulders, hips and knees.  Skin: Intact, no ulcerations or rash noted.  Psych: Good eye contact, normal affect. Memory intact not anxious or depressed appearing.  CNS: CN 2-12 intact, power,  normal throughout.no focal deficits noted.   Assessment & Plan  Benign essential hypertension DASH diet and  commitment to daily physical activity for a minimum of 30 minutes discussed and encouraged, as a part of hypertension management. The importance of attaining a healthy weight is also discussed.  BP/Weight 07/07/2021 05/27/2021 02/24/2021 02/13/2021 02/08/2021 02/05/2021 2/63/7858  Systolic BP 850 277 412 878 676 720 947  Diastolic BP 63 64 54 64 66 70 58  Wt. (Lbs) 187.12 180 180 - 138 181 181  BMI 30.2 29.95 29.95 - 22.96 30.12 30.12     Controlled, no change in medication   Chronic combined systolic and diastolic heart failure (HCC) Stable, no med change Managed  By Cardiology  Hyperlipidemia associated with type 2 diabetes mellitus ([place]) Hyperlipidemia:Low fat diet discussed and encouraged.   Lipid Panel  Lab Results  Component Value Date   CHOL 171 10/27/2020   HDL 45 10/27/2020   LDLCALC 107 (H) 10/27/2020   TRIG 106 10/27/2020   CHOLHDL 3.8 10/27/2020     Needs to lower fat intake  Updated lab needed at/ before next visit.   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   The need to take medication as prescribed, test blood sugar as directed, and to call between visits if there is a concern that blood sugar is uncontrolled is also discussed.   [name] [name]  is reminded of the importance of daily foot exam, annual eye examination, and good blood sugar, blood pressure and cholesterol control.  Diabetic Labs Latest Ref Rng & Units 07/07/2021 02/13/2021 01/04/2021 12/22/2020 10/27/2020  HbA1c 0.0 - 7.0 % 7.0 - - - 7.6(H)  Microalbumin mg/dL - - - - -  Micro/Creat Ratio <30 mcg/mg creat - - - - -  Chol 100 - 199 mg/dL - - - - 171  HDL >39 mg/dL - - - - 45  Calc LDL 0 - 99 mg/dL - - - - 107(H)  Triglycerides 0 - 149 mg/dL - - - - 106  Creatinine  0.44 - 1.00 mg/dL - 1.72(H) 1.87(H) 1.69(H) 1.80(H)   BP/Weight 07/07/2021 05/27/2021 02/24/2021 02/13/2021 02/08/2021 02/05/2021 04/12/5620  Systolic BP 308 657 846 962 952 841 324  Diastolic BP 63 64 54 64 66 70 58  Wt. (Lbs) 187.12 180 180 - 138 181 181  BMI 30.2 29.95 29.95 - 22.96 30.12 30.12   Foot/eye exam completion dates Latest Ref Rng & Units 08/22/2019 07/31/2017  Eye Exam No Retinopathy - -  Foot exam Order - - -  Foot Form Completion - Done Done        Generalized osteoarthritis Tylenol 500 mg one to two daily as needed for pain

## 2021-07-12 NOTE — Assessment & Plan Note (Signed)
DASH diet and commitment to daily physical activity for a minimum of 30 minutes discussed and encouraged, as a part of hypertension management. The importance of attaining a healthy weight is also discussed.  BP/Weight 07/07/2021 05/27/2021 02/24/2021 02/13/2021 02/08/2021 02/05/2021 2/99/3716  Systolic BP 967 893 810 175 102 585 277  Diastolic BP 63 64 54 64 66 70 58  Wt. (Lbs) 187.12 180 180 - 138 181 181  BMI 30.2 29.95 29.95 - 22.96 30.12 30.12     Controlled, no change in medication

## 2021-07-12 NOTE — Assessment & Plan Note (Signed)
Hyperlipidemia:Low fat diet discussed and encouraged.   Lipid Panel  Lab Results  Component Value Date   CHOL 171 10/27/2020   HDL 45 10/27/2020   LDLCALC 107 (H) 10/27/2020   TRIG 106 10/27/2020   CHOLHDL 3.8 10/27/2020     Needs to lower fat intake  Updated lab needed at/ before next visit.

## 2021-07-12 NOTE — Assessment & Plan Note (Signed)
Stable, no med change Managed  By Cardiology

## 2021-07-12 NOTE — Assessment & Plan Note (Signed)
Tylenol 500 mg one to two daily as needed for pain

## 2021-07-12 NOTE — Assessment & Plan Note (Signed)
Improved [name] [name] is reminded of the importance of commitment to daily physical activity for 30 minutes or more, as able and the need to limit carbohydrate intake to 30 to 60 grams per meal to help with blood sugar control.   The need to take medication as prescribed, test blood sugar as directed, and to call between visits if there is a concern that blood sugar is uncontrolled is also discussed.   [name] [name] is reminded of the importance of daily foot exam, annual eye examination, and good blood sugar, blood pressure and cholesterol control.  Diabetic Labs Latest Ref Rng & Units 07/07/2021 02/13/2021 01/04/2021 12/22/2020 10/27/2020  HbA1c 0.0 - 7.0 % 7.0 - - - 7.6(H)  Microalbumin mg/dL - - - - -  Micro/Creat Ratio <30 mcg/mg creat - - - - -  Chol 100 - 199 mg/dL - - - - 171  HDL >39 mg/dL - - - - 45  Calc LDL 0 - 99 mg/dL - - - - 107(H)  Triglycerides 0 - 149 mg/dL - - - - 106  Creatinine 0.44 - 1.00 mg/dL - 1.72(H) 1.87(H) 1.69(H) 1.80(H)   BP/Weight 07/07/2021 05/27/2021 02/24/2021 02/13/2021 02/08/2021 02/05/2021 5/46/5681  Systolic BP 275 170 017 494 496 759 163  Diastolic BP 63 64 54 64 66 70 58  Wt. (Lbs) 187.12 180 180 - 138 181 181  BMI 30.2 29.95 29.95 - 22.96 30.12 30.12   Foot/eye exam completion dates Latest Ref Rng & Units 08/22/2019 07/31/2017  Eye Exam No Retinopathy - -  Foot exam Order - - -  Foot Form Completion - Done Done

## 2021-07-12 NOTE — Progress Notes (Signed)
Cardiology Office Note    Date:  07/19/2021   ID:  Alleyne, Lac [birth year], MRN [id number]   PCP:  Fayrene Helper, [place]  Cardiologist:  Rozann Lesches, MD   Advanced Practice Provider:  No care team member to display Electrophysiologist:  None   [id number]}   Chief Complaint  Patient presents with   Follow-up     History of Present Illness:  [name] [name] is a 75 y.o. female with past medical history of chronic combined systolic and diastolic CHF/NICM cath in 2015 showing no significant CAD, s/p CRT-D placement in 09/2019, EF 10-15% in 2016, at 20% in 04/2018, at 15-20% in 12/2020, severe AS s/p TAVR in 03/2015, HTN, HLD, Type 2 DM, Stage 3-4 CKD and known LBBB   Patient saw Dr. [name] in 12/2020 and her recent echocardiogram had shown her EF was reduced at 15-20% and her TAVR prosthesis was functioning normally. She was continued on her current dosing of Lasix and Coreg with Spironolactone being reduced to 12.63m daily and Entresto was started at 24-277mBID with close follow-up labs recommended given her CKD.   Patient last saw [place]-C 02/04/2021 and was doing well.  Entresto was not titrated because of current blood pressure.  Renal function was stable.  Patient comes in for f/u. Main compliant is knee pain. Walks with a cane. No chest pain, dyspnea, palpitations,edema. Grandson lives with her. Cooked for extended family for Thanksgiving.  Past Medical History:  Diagnosis Date   Acute hypoxemic respiratory failure due to COVID-19 ([facility]11/15/2021   Anxiety    Aortic stenosis    Arthritis    Biventricular ICD (implantable cardioverter-defibrillator) in place    March 2021, [employer]  -Dr. TaLovena Le Chronic combined systolic and diastolic CHF (congestive heart failure) ([facility]   CKD (chronic kidney disease) stage 3, GFR 30-59 ml/min (HCC)    Constipation    COVID-19 virus infection 07/16/2020   Depression     Diabetes mellitus, type 2 ([facility]   Essential hypertension    Hyperlipidemia    Incidental pulmonary nodule, > 3m52mnd < 8mm60m 7 x 4 mm LLL nodule   LBBB (left bundle branch block)    Nonischemic cardiomyopathy (HCC)    No significant obstructive CAD at heart catheterization 05/2014, LVEF 20%   Obesity    Pneumonia 2013   S/P TAVR (transcatheter aortic valve replacement) 03/24/2015   23 mm Edwards Sapien 3 transcatheter heart valve placed via open right transfemoral approach   Symptomatic PVCs     Past Surgical History:  Procedure Laterality Date   BIV ICD INSERTION CRT-D N/A 10/28/2019   Procedure: BIV ICD INSERTION CRT-D;  Surgeon: TaylEvans Lance;  [facility];  Service: Cardiovascular;  Laterality: N/A;   CARDIAC CATHETERIZATION     CESAREAN SECTION  1987   FLEXIBLE BRONCHOSCOPY N/A 01/14/2015   Procedure: FLEXIBLE BRONCHOSCOPY;  Surgeon: StevMelrose Nakayama;  [facility];  Laterality: N/A;   LEFT AND RIGHT HEART CATHETERIZATION WITH CORONARY ANGIOGRAM N/A 12/05/2011   Procedure: LEFT AND RIGHT HEART CATHETERIZATION WITH CORONARY ANGIOGRAM;  Surgeon: ChriBurnell Blanks;  [facility];  Service: Cardiovascular;  Laterality: N/A;   LEFT AND RIGHT HEART CATHETERIZATION WITH CORONARY ANGIOGRAM N/A 05/23/2014   Procedure: LEFT AND RIGHT HEART CATHETERIZATION WITH CORONARY ANGIOGRAM;  Surgeon: ChriBurnell Blanks;  Location:  [place] CATH LAB;  Service: Cardiovascular;  Laterality: N/A;   MULTIPLE EXTRACTIONS WITH ALVEOLOPLASTY N/A 02/27/2014   Procedure: Extraction of tooth #'s 2,4,5,6,7,8,9,10,11,12, 22, 23, 24, 25, 26, 28 with alveoloplasty and bilateral mandibular tori reductions;  Surgeon: [name], DDS;  Location: [facility];  Service: Oral Surgery;  Laterality: N/A;   RIGID BRONCHOSCOPY N/A 01/14/2015   Procedure: RIGID BRONCHOSCOPY with Removal Of Foreign Body ;  Surgeon: [name], MD;  Location: [place];   Service: Thoracic;  Laterality: N/A;   TEE WITHOUT CARDIOVERSION N/A 03/24/2015   Procedure: TRANSESOPHAGEAL ECHOCARDIOGRAM (TEE);  Surgeon: [name], MD;  Location: [place];  Service: Open Heart Surgery;  Laterality: N/A;   TRANSCATHETER AORTIC VALVE REPLACEMENT, TRANSFEMORAL Bilateral 03/24/2015   Procedure: TRANSCATHETER AORTIC VALVE REPLACEMENT, TRANSFEMORAL;  Surgeon: [name], MD;  Location: [place];  Service: Open Heart Surgery;  Laterality: Bilateral;   TUBAL LIGATION  1987   VIDEO BRONCHOSCOPY Bilateral 06/05/2014   Procedure: VIDEO BRONCHOSCOPY WITHOUT FLUORO;  Surgeon: [name], MD;  Location: [facility];  Service: Cardiopulmonary;  Laterality: Bilateral;    Current Medications: Current Meds  Medication Sig   acetaminophen (TYLENOL) 500 MG tablet Take one tablet two times daily, by mouth, for knee  pain (Patient taking differently: Take 500 mg by mouth 2 (two) times daily as needed for moderate pain. Take one tablet two times daily, by mouth, for knee  pain)   allopurinol (ZYLOPRIM) 100 MG tablet Take by mouth.   aspirin EC 81 MG tablet Take 81 mg by mouth every morning.    blood glucose meter kit and supplies Dispense based on patient and insurance preference. Use to test three times daily (FOR ICD-10 E10.9, E11.9).   carvedilol (COREG) 25 MG tablet TAKE 1 TABLET BY MOUTH TWICE DAILY WITH MEALS.   cetirizine (ZYRTEC) 10 MG tablet Take 1 tablet (10 mg total) by mouth daily.   colchicine 0.6 MG tablet Take 1 tablet (0.6 mg total) by mouth 2 (two) times daily. Take with food   diclofenac Sodium (VOLTAREN) 1 % GEL Apply 1 application topically 4 (four) times daily as needed (pain).   ezetimibe (ZETIA) 10 MG tablet TAKE ONE TABLET BY MOUTH ONCE DAILY.   fenofibrate (TRICOR) 145 MG tablet TAKE (1) TABLET BY MOUTH ONCE DAILY.   furosemide (LASIX) 40 MG tablet TAKE (1) TABLET BY MOUTH EACH MORNING.   glipiZIDE (GLUCOTROL XL) 2.5 MG 24 hr tablet TAKE ONE  TABLET BY MOUTH ONCE DAILY WITH BREAKFAST.   Insulin Pen Needle (LITETOUCH PEN NEEDLES) 31G X 8 MM MISC Use to inject insulin twice daily Dx e11.65   Lancets (ONETOUCH DELICA PLUS [id number]) MISC USE TO CHECK BLOOD SUGAR THREE TIMES DAILY.   LIVALO 2 MG TABS TAKE ONE TABLET BY MOUTH ONCE DAILY AFTER SUPPER.   Multiple Vitamins-Minerals (MULTIVITAMIN PO) Take 1 tablet by mouth every morning.    NOVOLOG MIX 70/30 FLEXPEN (70-30) 100 UNIT/ML FlexPen INJECT 12 TO 15 UNITS SUBCUTANEOUSLY TWICE DAILY.   ONETOUCH ULTRA test strip USE TO CHECK BLOOD SUGAR 3 TIMES DAILY   potassium chloride (KLOR-CON) 10 MEQ tablet TAKE ONE TABLET BY MOUTH DAILY. DO NOT TAKE IF NOT TAKING FUROSEMIDE.   sacubitril-valsartan (ENTRESTO) 24-26 MG Take 1 tablet by mouth 2 (two) times daily.   spironolactone (ALDACTONE) 25 MG tablet Take 0.5 tablets (12.5 mg total) by mouth daily.     Allergies:   Crestor [rosuvastatin], Tramadol, and Penicillins   Social History  Socioeconomic History   Marital status: Widowed    Spouse name: Not on file   Number of children: 5   Years of education: Not on file   Highest education level: Not on file  Occupational History   Occupation: Retired  Tobacco Use   Smoking status: Never   Smokeless tobacco: Never  Vaping Use   Vaping Use: Never used  Substance and Sexual Activity   Alcohol use: No   Drug use: No   Sexual activity: Never    Birth control/protection: None, Post-menopausal  Other Topics Concern   Not on file  Social History Narrative   Not on file   Social Determinants of Health   Financial Resource Strain: Low Risk    Difficulty of Paying Living Expenses: Not hard at all  Food Insecurity: No Food Insecurity   Worried About Charity fundraiser in the Last Year: Never true   [place] in the Last Year: Never true  Transportation Needs: No Transportation Needs   Lack of Transportation (Medical): No   Lack of Transportation (Non-Medical): No  Physical  Activity: Insufficiently Active   Days of Exercise per Week: 7 days   Minutes of Exercise per Session: 20 min  Stress: No Stress Concern Present   Feeling of Stress : Not at all  Social Connections: Moderately Isolated   Frequency of Communication with Friends and Family: More than three times a week   Frequency of Social Gatherings with Friends and Family: More than three times a week   Attends Religious Services: More than 4 times per year   Active Member of [employer] or Organizations: No   Attends Archivist Meetings: Never   Marital Status: Widowed     Family History:  The patient's  family history includes Breast cancer in her sister; Cancer in her brother; Colon cancer (age of onset: 33) in her father; Congestive Heart Failure in her daughter and son; Diabetes in her brother and mother; Heart attack (age of onset: 60) in her mother; Heart disease in her mother; High Cholesterol in her son; Hypertension in her brother, mother, and son; Stroke in her brother.   ROS:   Please see the history of present illness.    ROS All other systems reviewed and are negative.   PHYSICAL EXAM:   VS:  BP 110/70   Pulse 76   Ht 5' 6"  (1.676 m)   Wt 189 lb 9.6 oz (86 kg)   SpO2 98%   BMI 30.60 kg/m   Physical Exam  GEN: Well nourished, well developed, in no acute distress  Neck: no JVD, carotid bruits, or masses Cardiac:RRR; no murmurs, rubs, or gallops  Respiratory:  clear to auscultation bilaterally, normal work of breathing GI: soft, nontender, nondistended, + BS Ext: without cyanosis, clubbing, or edema, Good distal pulses bilaterally Neuro:  Alert and Oriented x 3,  Psych: euthymic mood, full affect  Wt Readings from Last 3 Encounters:  07/19/21 189 lb 9.6 oz (86 kg)  07/07/21 187 lb 1.9 oz (84.9 kg)  05/27/21 180 lb (81.6 kg)      Studies/Labs Reviewed:   EKG:  EKG is not ordered today.    Recent Labs: 10/27/2020: ALT 15; TSH 3.210 12/22/2020: B Natriuretic Peptide  336.0 02/13/2021: Platelets 277 02/24/2021: Hemoglobin 7.2 07/19/2021: BUN 43; Creatinine, Ser 1.97; Potassium 4.3; Sodium 136   Lipid Panel    Component Value Date/Time   CHOL 171 10/27/2020 1047   TRIG 106 10/27/2020 1047  HDL 45 10/27/2020 1047   CHOLHDL 3.8 10/27/2020 1047   CHOLHDL 2.9 08/20/2019 1001   VLDL 21 02/06/2017 0916   LDLCALC 107 (H) 10/27/2020 1047   LDLCALC 94 08/20/2019 1001    Additional studies/ records that were reviewed today include:  Cardiac Catheterization: 05/2014 Angiographic Findings:   Left main: No evidence of disease.   Left Anterior Descending Artery: Large caliber vessel that courses to the apex. Large caliber diagonal branch. No evidence of disease.   Circumflex Artery: Large caliber vessel with large obtuse marginal branch. No evidence of disease.   Right Coronary Artery: Moderate sized dominant vessel. No evidence of disease.   Impression: 1. No angiographic evidence of CAD 2. Aortic stenosis, severe by dobutamine echo   Recommendations: Planning in place for aortic valve replacement. Pt will be admitted overnight for hydration post cath given renal insufficiency.       Complications:  None; patient tolerated the procedure well.                  Echocardiogram: 12/2020 IMPRESSIONS     1. Left ventricular ejection fraction, by estimation, is 15-20%. The left  ventricle has severely decreased function. The left ventricle demonstrates  global hypokinesis. The left ventricular internal cavity size was mildly  dilated. There is moderate  asymmetric left ventricular hypertrophy. Left ventricular diastolic  parameters are indeterminate.   2. Right ventricular systolic function is normal. The right ventricular  size is normal. There is normal pulmonary artery systolic pressure. The  estimated right ventricular systolic pressure is 10.9 mmHg.   3. Left atrial size was moderately dilated.   4. The mitral valve is abnormal. Mild mitral  valve regurgitation.   5. The aortic valve has been repaired/replaced. Aortic valve  regurgitation is not visualized. There is a 23 mm Sapien prosthetic (TAVR)  valve present in the aortic position. Aortic valve mean gradient measures  13.5 mmHg. No paravalvular regurgitation.   6. The inferior vena cava is normal in size with greater than 50%  respiratory variability, suggesting right atrial pressure of 3 mmHg.     Risk Assessment/Calculations:         ASSESSMENT:    1. Chronic combined systolic and diastolic heart failure ([place])   2. Severe aortic stenosis   3. ICD (implantable cardioverter-defibrillator) in place   4. Essential hypertension   5. CKD (chronic kidney disease), stage IV (HCC)      PLAN:  In order of problems listed above:  Chronic systolic CHF ejection fraction 15 to 20% on echo 12/2020 on low-dose Entresto, Coreg, spironolactone.  No SGLT 2 inhibitor given renal insufficiency. Overall doing well and compensated. Will repeat echo. Can't increase meds because of BP.   Severe AS status post TAVR 03/2015 valve functioning normally on echo 12/2020-asymptomatic  BiV ICD 09/2019 followed by [name]  Hypertension controlled  CKD stage III-IV followed by Dr. Theador Hawthorne in Oct. creatinine 1.72 in July. Recheck labs today.    Shared Decision Making/Informed Consent        Medication Adjustments/Labs and Tests Ordered: Current medicines are reviewed at length with the patient today.  Concerns regarding medicines are outlined above.  Medication changes, Labs and Tests ordered today are listed in the Patient Instructions below. Patient Instructions  Medication Instructions:  Your physician recommends that you continue on your current medications as directed. Please refer to the Current Medication list given to you today.  *If you need a refill on your cardiac medications before your  next appointment, please call your pharmacy*   Lab Work: Your physician recommends  that you return for lab work in: Today Artist)   If you have labs (blood work) drawn today and your tests are completely normal, you will receive your results only by: MyChart Message (if you have MyChart) OR A paper copy in the mail If you have any lab test that is abnormal or we need to change your treatment, we will call you to review the results.   Testing/Procedures: Your physician has requested that you have an echocardiogram. Echocardiography is a painless test that uses sound waves to create images of your heart. It provides your doctor with information about the size and shape of your heart and how well your heart's chambers and valves are working. This procedure takes approximately one hour. There are no restrictions for this procedure.    Follow-Up: At [facility], you and your health needs are our priority.  As part of our continuing mission to provide you with exceptional heart care, we have created designated Provider Care Teams.  These Care Teams include your primary Cardiologist (physician) and Advanced Practice Providers (APPs -  Physician Assistants and Nurse Practitioners) who all work together to provide you with the care you need, when you need it.  We recommend signing up for the patient portal called "MyChart".  Sign up information is provided on this After Visit Summary.  MyChart is used to connect with patients for Virtual Visits (Telemedicine).  Patients are able to view lab/test results, encounter notes, upcoming appointments, etc.  Non-urgent messages can be sent to your provider as well.   To learn more about what you can do with MyChart, go to [email or web address].    Your next appointment:   6 month(s)  The format for your next appointment:   In Person  Provider:   Rozann Lesches, MD    Other Instructions Thank you for choosing [facility]! ]    Signed, Ermalinda Barrios, PA-C  07/19/2021 12:24 PM    [address], [place], [place]  [address] Phone: [phone]; Fax: [phone]

## 2021-07-14 ENCOUNTER — Ambulatory Visit: Payer: PPO | Admitting: Family Medicine

## 2021-07-15 ENCOUNTER — Other Ambulatory Visit: Payer: Self-pay | Admitting: Family Medicine

## 2021-07-19 ENCOUNTER — Encounter: Payer: Self-pay | Admitting: Physician Assistant

## 2021-07-19 ENCOUNTER — Other Ambulatory Visit (HOSPITAL_COMMUNITY)
Admission: RE | Admit: 2021-07-19 | Discharge: 2021-07-19 | Disposition: A | Discharge status: Home / Self Care | Payer: PPO | Source: Ambulatory Visit | Attending: Physician Assistant | Admitting: Physician Assistant

## 2021-07-19 ENCOUNTER — Telehealth: Payer: Self-pay | Admitting: Orthopedic Surgery

## 2021-07-19 ENCOUNTER — Ambulatory Visit: Payer: PPO | Admitting: Physician Assistant

## 2021-07-19 ENCOUNTER — Other Ambulatory Visit: Payer: Self-pay

## 2021-07-19 VITALS — BP 110/70 | HR 76 | Ht 66.0 in | Wt 189.6 lb

## 2021-07-19 DIAGNOSIS — I10 Essential (primary) hypertension: Secondary | ICD-10-CM | POA: Diagnosis not present

## 2021-07-19 DIAGNOSIS — N184 Chronic kidney disease, stage 4 (severe): Secondary | ICD-10-CM | POA: Insufficient documentation

## 2021-07-19 DIAGNOSIS — I5042 Chronic combined systolic (congestive) and diastolic (congestive) heart failure: Secondary | ICD-10-CM

## 2021-07-19 DIAGNOSIS — I350 Nonrheumatic aortic (valve) stenosis: Secondary | ICD-10-CM | POA: Diagnosis not present

## 2021-07-19 DIAGNOSIS — Z95810 Presence of automatic (implantable) cardiac defibrillator: Secondary | ICD-10-CM | POA: Diagnosis not present

## 2021-07-19 LAB — BASIC METABOLIC PANEL
Anion gap: 8 (ref 5–15)
BUN: 43 mg/dL — ABNORMAL HIGH (ref 8–23)
CO2: 24 mmol/L (ref 22–32)
Calcium: 9.3 mg/dL (ref 8.9–10.3)
Chloride: 104 mmol/L (ref 98–111)
Creatinine, Ser: 1.97 mg/dL — ABNORMAL HIGH (ref 0.44–1.00)
GFR, Estimated: 26 mL/min — ABNORMAL LOW (ref >60)
Glucose, Bld: 197 mg/dL — ABNORMAL HIGH (ref 70–99)
Potassium: 4.3 mmol/L (ref 3.5–5.1)
Sodium: 136 mmol/L (ref 135–145)

## 2021-07-19 NOTE — Telephone Encounter (Signed)
Call/Voice message received from patient; call returned; line is ringing busy, tried x2.

## 2021-07-19 NOTE — Patient Instructions (Signed)
Medication Instructions:  Your physician recommends that you continue on your current medications as directed. Please refer to the Current Medication list given to you today.  *If you need a refill on your cardiac medications before your next appointment, please call your pharmacy*   Lab Work: Your physician recommends that you return for lab work in: Today Artist)   If you have labs (blood work) drawn today and your tests are completely normal, you will receive your results only by: MyChart Message (if you have MyChart) OR A paper copy in the mail If you have any lab test that is abnormal or we need to change your treatment, we will call you to review the results.   Testing/Procedures: Your physician has requested that you have an echocardiogram. Echocardiography is a painless test that uses sound waves to create images of your heart. It provides your doctor with information about the size and shape of your heart and how well your heart's chambers and valves are working. This procedure takes approximately one hour. There are no restrictions for this procedure.    Follow-Up: At [facility], you and your health needs are our priority.  As part of our continuing mission to provide you with exceptional heart care, we have created designated Provider Care Teams.  These Care Teams include your primary Cardiologist (physician) and Advanced Practice Providers (APPs -  Physician Assistants and Nurse Practitioners) who all work together to provide you with the care you need, when you need it.  We recommend signing up for the patient portal called "MyChart".  Sign up information is provided on this After Visit Summary.  MyChart is used to connect with patients for Virtual Visits (Telemedicine).  Patients are able to view lab/test results, encounter notes, upcoming appointments, etc.  Non-urgent messages can be sent to your provider as well.   To learn more about what you can do with MyChart, go to  [email or web address].    Your next appointment:   6 month(s)  The format for your next appointment:   In Person  Provider:   Rozann Lesches, MD    Other Instructions Thank you for choosing [facility]! ]

## 2021-07-20 NOTE — Telephone Encounter (Signed)
Spoke with patient; scheduled an appointment; aware.

## 2021-07-22 ENCOUNTER — Other Ambulatory Visit: Payer: Self-pay | Admitting: Family Medicine

## 2021-07-26 ENCOUNTER — Ambulatory Visit (INDEPENDENT_AMBULATORY_CARE_PROVIDER_SITE_OTHER): Payer: PPO

## 2021-07-26 DIAGNOSIS — I428 Other cardiomyopathies: Secondary | ICD-10-CM

## 2021-07-26 LAB — CUP PACEART REMOTE DEVICE CHECK
Battery Remaining Longevity: 98 mo
Battery Voltage: 2.97 V
Brady Statistic AP VP Percent: 8.04 %
Brady Statistic AP VS Percent: 0.23 %
Brady Statistic AS VP Percent: 89.8 %
Brady Statistic AS VS Percent: 1.92 %
Brady Statistic RA Percent Paced: 8.16 %
Brady Statistic RV Percent Paced: 0.29 %
Date Time Interrogation Session: 20221212001705
HighPow Impedance: 70 Ohm
Implantable Lead Implant Date: 20210315
Implantable Lead Implant Date: 20210315
Implantable Lead Implant Date: 20210315
Implantable Lead Location: 753858
Implantable Lead Location: 753859
Implantable Lead Location: 753860
Implantable Lead Model: 4598
Implantable Lead Model: 5076
Implantable Lead Model: 6935
Implantable Pulse Generator Implant Date: 20210315
Lead Channel Impedance Value: 188.1 Ohm
Lead Channel Impedance Value: 198.837
Lead Channel Impedance Value: 218.88 Ohm
Lead Channel Impedance Value: 247.704
Lead Channel Impedance Value: 266.667
Lead Channel Impedance Value: 342 Ohm
Lead Channel Impedance Value: 361 Ohm
Lead Channel Impedance Value: 399 Ohm
Lead Channel Impedance Value: 418 Ohm
Lead Channel Impedance Value: 456 Ohm
Lead Channel Impedance Value: 475 Ohm
Lead Channel Impedance Value: 551 Ohm
Lead Channel Impedance Value: 608 Ohm
Lead Channel Impedance Value: 665 Ohm
Lead Channel Impedance Value: 703 Ohm
Lead Channel Impedance Value: 874 Ohm
Lead Channel Impedance Value: 893 Ohm
Lead Channel Impedance Value: 950 Ohm
Lead Channel Pacing Threshold Amplitude: 0.5 V
Lead Channel Pacing Threshold Amplitude: 0.75 V
Lead Channel Pacing Threshold Amplitude: 0.75 V
Lead Channel Pacing Threshold Pulse Width: 0.4 ms
Lead Channel Pacing Threshold Pulse Width: 0.4 ms
Lead Channel Pacing Threshold Pulse Width: 0.4 ms
Lead Channel Sensing Intrinsic Amplitude: 20.625 mV
Lead Channel Sensing Intrinsic Amplitude: 20.625 mV
Lead Channel Sensing Intrinsic Amplitude: 3 mV
Lead Channel Sensing Intrinsic Amplitude: 3 mV
Lead Channel Setting Pacing Amplitude: 1.25 V
Lead Channel Setting Pacing Amplitude: 1.5 V
Lead Channel Setting Pacing Amplitude: 2 V
Lead Channel Setting Pacing Pulse Width: 0.4 ms
Lead Channel Setting Pacing Pulse Width: 0.4 ms
Lead Channel Setting Sensing Sensitivity: 0.3 mV

## 2021-08-02 ENCOUNTER — Other Ambulatory Visit: Payer: Self-pay | Admitting: Family Medicine

## 2021-08-02 ENCOUNTER — Ambulatory Visit: Payer: PPO | Admitting: Orthopedic Surgery

## 2021-08-02 ENCOUNTER — Other Ambulatory Visit: Payer: Self-pay

## 2021-08-02 ENCOUNTER — Encounter: Payer: Self-pay | Admitting: Orthopedic Surgery

## 2021-08-02 DIAGNOSIS — M25562 Pain in left knee: Secondary | ICD-10-CM

## 2021-08-02 DIAGNOSIS — M25561 Pain in right knee: Secondary | ICD-10-CM

## 2021-08-02 DIAGNOSIS — G8929 Other chronic pain: Secondary | ICD-10-CM | POA: Diagnosis not present

## 2021-08-02 MED ORDER — PREDNISONE 10 MG PO TABS: 10.0000 mg | ORAL_TABLET | Freq: Every day | ORAL | 0 refills | Status: DC

## 2021-08-02 MED ORDER — GABAPENTIN 100 MG PO CAPS: 100.0000 mg | ORAL_CAPSULE | Freq: Three times a day (TID) | ORAL | 2 refills | Status: DC

## 2021-08-02 NOTE — Progress Notes (Signed)
Chief Complaint  Patient presents with   Knee Pain    Left worse than right    Allergies  Allergen Reactions   Crestor [Rosuvastatin] Other (See Comments)    Muscle aches and elevated CK   Tramadol Nausea Only    States blood sugar elevated also   Penicillins Rash and Other (See Comments)    Family unsure of reaction, but certain mom said she was allergic Did it involve swelling of the face/tongue/throat, SOB, or low BP? No Did it involve sudden or severe rash/hives, skin peeling, or any reaction on the inside of your mouth or nose? Yes Did you need to seek medical attention at a hospital or doctor's office? Yes When did it last happen?  10 + years     If all above answers are NO, may proceed with cephalosporin use.    75 year old female with history of lumbar disease radiculopathy knee pain  X-rays are consistent with osteoarthritis does not seem that bad  Presents back with bilateral knee pain left worse than right  She says she is very sore has trouble getting out of a chair has trouble getting up and down  Physical Exam Musculoskeletal:     Right knee: Effusion and crepitus present. No swelling, deformity, erythema, ecchymosis or lacerations. Decreased range of motion. Tenderness present over the medial joint line and lateral joint line. No ACL laxity or PCL laxity.     Left knee: Effusion and crepitus present. No swelling, deformity, erythema, ecchymosis or lacerations. Decreased range of motion. Tenderness present over the medial joint line and lateral joint line. No ACL laxity or PCL laxity.  No diagnosis found.  She has a defibrillator in place cardiology is recommended not doing surgery  Recommend repeat injections  Meds ordered this encounter  Medications   gabapentin (NEURONTIN) 100 MG capsule    Sig: Take 1 capsule (100 mg total) by mouth 3 (three) times daily.    Dispense:  90 capsule    Refill:  2   predniSONE (DELTASONE) 10 MG tablet    Sig: Take 1  tablet (10 mg total) by mouth daily with breakfast.    Dispense:  42 tablet    Refill:  0      Procedure note for bilateral knee injections  Procedure note left knee injection verbal consent was obtained to inject left knee joint  Timeout was completed to confirm the site of injection  The medications used were 40 mg depomedrol and 3 cc of 1% lidocaine  Anesthesia was provided by ethyl chloride and the skin was prepped with alcohol.  After cleaning the skin with alcohol a 20-gauge needle was used to inject the left knee joint. There were no complications. A sterile bandage was applied.   Procedure note right knee injection verbal consent was obtained to inject right knee joint  Timeout was completed to confirm the site of injection  The medications used were 40 mg depomedrol and 3 cc of 1% lidocaine  Anesthesia was provided by ethyl chloride and the skin was prepped with alcohol.  After cleaning the skin with alcohol a 20-gauge needle was used to inject the right knee joint. There were no complications. A sterile bandage was applied.    Meds ordered this encounter  Medications   gabapentin (NEURONTIN) 100 MG capsule    Sig: Take 1 capsule (100 mg total) by mouth 3 (three) times daily.    Dispense:  90 capsule    Refill:  2   predniSONE (  DELTASONE) 10 MG tablet    Sig: Take 1 tablet (10 mg total) by mouth daily with breakfast.    Dispense:  42 tablet    Refill:  0   Fu 4 mos

## 2021-08-02 NOTE — Patient Instructions (Addendum)
Rily try these 2 medications    Meds ordered this encounter  Medications   gabapentin (NEURONTIN) 100 MG capsule    Sig: Take 1 capsule (100 mg total) by mouth 3 (three) times daily.    Dispense:  90 capsule    Refill:  2   predniSONE (DELTASONE) 10 MG tablet    Sig: Take 1 tablet (10 mg total) by mouth daily with breakfast.    Dispense:  42 tablet    Refill:  0     You have received an injection of steroids into the joint. 15% of patients will have increased pain within the 24 hours postinjection.   This is transient and will go away.   We recommend that you use ice packs on the injection site for 20 minutes every 2 hours and extra strength Tylenol 2 tablets every 8 as needed until the pain resolves.  If you continue to have pain after taking the Tylenol and using the ice please call the office for further instructions.

## 2021-08-04 NOTE — Progress Notes (Signed)
Remote ICD transmission.   

## 2021-08-09 ENCOUNTER — Other Ambulatory Visit: Payer: Self-pay | Admitting: Family Medicine

## 2021-08-10 ENCOUNTER — Other Ambulatory Visit: Payer: Self-pay | Admitting: Family Medicine

## 2021-08-12 ENCOUNTER — Ambulatory Visit: Payer: PPO

## 2021-08-12 ENCOUNTER — Other Ambulatory Visit: Payer: Self-pay

## 2021-08-20 ENCOUNTER — Other Ambulatory Visit: Payer: Self-pay | Admitting: Nurse Practitioner

## 2021-08-23 ENCOUNTER — Other Ambulatory Visit: Payer: Self-pay

## 2021-08-23 ENCOUNTER — Ambulatory Visit (INDEPENDENT_AMBULATORY_CARE_PROVIDER_SITE_OTHER): Payer: PPO | Admitting: Nurse Practitioner

## 2021-08-23 ENCOUNTER — Encounter: Payer: Self-pay | Admitting: Nurse Practitioner

## 2021-08-23 ENCOUNTER — Ambulatory Visit: Payer: PPO

## 2021-08-23 DIAGNOSIS — R051 Acute cough: Secondary | ICD-10-CM

## 2021-08-23 DIAGNOSIS — R059 Cough, unspecified: Secondary | ICD-10-CM | POA: Insufficient documentation

## 2021-08-23 DIAGNOSIS — J309 Allergic rhinitis, unspecified: Secondary | ICD-10-CM | POA: Diagnosis not present

## 2021-08-23 LAB — POCT INFLUENZA A/B
Influenza A, POC: NEGATIVE
Influenza B, POC: NEGATIVE

## 2021-08-23 MED ORDER — FLUTICASONE PROPIONATE 50 MCG/ACT NA SUSP: 2.0000 | Freq: Every day | NASAL | 6 refills | Status: DC

## 2021-08-23 MED ORDER — CETIRIZINE HCL 10 MG PO TABS: 10.0000 mg | ORAL_TABLET | Freq: Every day | ORAL | 1 refills | Status: DC

## 2021-08-23 NOTE — Progress Notes (Signed)
Virtual Visit via Telephone Note  I connected with Ranetta Armacost @ on 08/23/21 at 12:55pm by telephone and verified that I am speaking with the correct person using two identifiers. I spent 8  minutes talking to pt and reviewing her chart.   Location: Patient: home Provider: office   I discussed the limitations, risks, security and privacy concerns of performing an evaluation and management service by telephone and the availability of in person appointments. I also discussed with the patient that there may be a patient responsible charge related to this service. The patient expressed understanding and agreed to proceed.   History of Present Illness: Pt c/o running nose, nasal congestion, sneezing that's started on 1/9. Last night she started coughing, has clear phlegm, cough feels better with OTC robitussin. She denies fever, body aches, chest congestion, wheezing , sob., bloody sputum  She has not had her covid and flu vaccine, her grand daughter was sick a week ago.  She has no ride today , she will come for covid and flu test tomorrow.     Observations/Objective:   Assessment and Plan: Allergic rhinitis: use Flonase nasal spray 2 spray daily, zytrec 10mg  daily.   Cough: OBTAIN COVID AND FLU TEST Follow Up Instructions:    I discussed the assessment and treatment plan with the patient. The patient was provided an opportunity to ask questions and all were answered. The patient agreed with the plan and demonstrated an understanding of the instructions.   The patient was advised to call back or seek an in-person evaluation if the symptoms worsen or if the condition fails to improve as anticipated.

## 2021-08-23 NOTE — Assessment & Plan Note (Signed)
Use Flonase nasal spray 2 spray daily, zytrec 10mg  daily.

## 2021-08-23 NOTE — Assessment & Plan Note (Signed)
OBTAIN COVID AND FLU TEST. Use robitussin PRN.

## 2021-08-23 NOTE — Addendum Note (Signed)
Addended by: Glee Arvin D on: 08/23/2021 02:22 PM   Modules accepted: Orders

## 2021-08-25 ENCOUNTER — Ambulatory Visit (HOSPITAL_COMMUNITY): Payer: PPO

## 2021-08-25 LAB — NOVEL CORONAVIRUS, NAA: SARS-CoV-2, NAA: NOT DETECTED

## 2021-08-25 LAB — SARS-COV-2, NAA 2 DAY TAT

## 2021-08-25 NOTE — Progress Notes (Signed)
Called and notified patient of test results, she verbalized understanding.

## 2021-08-30 ENCOUNTER — Other Ambulatory Visit: Payer: Self-pay | Admitting: Family Medicine

## 2021-09-02 ENCOUNTER — Encounter: Payer: PPO | Admitting: Nurse Practitioner

## 2021-09-02 ENCOUNTER — Other Ambulatory Visit: Payer: Self-pay

## 2021-09-02 ENCOUNTER — Ambulatory Visit (INDEPENDENT_AMBULATORY_CARE_PROVIDER_SITE_OTHER): Payer: PPO

## 2021-09-02 DIAGNOSIS — Z780 Asymptomatic menopausal state: Secondary | ICD-10-CM | POA: Diagnosis not present

## 2021-09-02 DIAGNOSIS — Z0000 Encounter for general adult medical examination without abnormal findings: Secondary | ICD-10-CM

## 2021-09-02 NOTE — Patient Instructions (Addendum)
[name] [name] , Thank you for taking time to come for your Medicare Wellness Visit. I appreciate your ongoing commitment to your health goals. Please review the following plan we discussed and let me know if I can assist you in the future.   These are the goals we discussed:  Goals      Exercise 3x per week (30 min per time)     Patient would like to increase her exercise to 4 days a weeks for 20-30 minutes at a time.         This is a list of the screening recommended for you and due dates:  Health Maintenance  Topic Date Due   COVID-19 Vaccine (1) Never done   Zoster (Shingles) Vaccine (1 of 2) Never done   DEXA scan (bone density measurement)  Never done   Eye exam for diabetics  05/31/2017   Tetanus Vaccine  06/29/2021   Hemoglobin A1C  01/04/2022   Complete foot exam   02/24/2022   Pneumonia Vaccine  Completed   Flu Shot  Completed   Hepatitis C Screening: USPSTF Recommendation to screen - Ages 18-79 yo.  Completed   HPV Vaccine  Aged Out    Health Maintenance, Female Adopting a healthy lifestyle and getting preventive care are important in promoting health and wellness. Ask your health care provider about: The right schedule for you to have regular tests and exams. Things you can do on your own to prevent diseases and keep yourself healthy. What should I know about diet, weight, and exercise? Eat a healthy diet  Eat a diet that includes plenty of vegetables, fruits, low-fat dairy products, and lean protein. Do not eat a lot of foods that are high in solid fats, added sugars, or sodium. Maintain a healthy weight Body mass index (BMI) is used to identify weight problems. It estimates body fat based on height and weight. Your health care provider can help determine your BMI and help you achieve or maintain a healthy weight. Get regular exercise Get regular exercise. This is one of the most important things you can do for your health. Most adults should: Exercise for at  least 150 minutes each week. The exercise should increase your heart rate and make you sweat (moderate-intensity exercise). Do strengthening exercises at least twice a week. This is in addition to the moderate-intensity exercise. Spend less time sitting. Even light physical activity can be beneficial. Watch cholesterol and blood lipids Have your blood tested for lipids and cholesterol at 77 years of age, then have this test every 5 years. Have your cholesterol levels checked more often if: Your lipid or cholesterol levels are high. You are older than 77 years of age. You are at high risk for heart disease. What should I know about cancer screening? Depending on your health history and family history, you may need to have cancer screening at various ages. This may include screening for: Breast cancer. Cervical cancer. Colorectal cancer. Skin cancer. Lung cancer. What should I know about heart disease, diabetes, and high blood pressure? Blood pressure and heart disease High blood pressure causes heart disease and increases the risk of stroke. This is more likely to develop in people who have high blood pressure readings or are overweight. Have your blood pressure checked: Every 3-5 years if you are 22-66 years of age. Every year if you are 70 years old or older. Diabetes Have regular diabetes screenings. This checks your fasting blood sugar level. Have the screening done: Once  every three years after age 30 if you are at a normal weight and have a low risk for diabetes. More often and at a younger age if you are overweight or have a high risk for diabetes. What should I know about preventing infection? Hepatitis B If you have a higher risk for hepatitis B, you should be screened for this virus. Talk with your health care provider to find out if you are at risk for hepatitis B infection. Hepatitis C Testing is recommended for: Everyone born from [birth year] through 1966. Anyone with known risk  factors for hepatitis C. Sexually transmitted infections (STIs) Get screened for STIs, including gonorrhea and chlamydia, if: You are sexually active and are younger than 77 years of age. You are older than 77 years of age and your health care provider tells you that you are at risk for this type of infection. Your sexual activity has changed since you were last screened, and you are at increased risk for chlamydia or gonorrhea. Ask your health care provider if you are at risk. Ask your health care provider about whether you are at high risk for HIV. Your health care provider may recommend a prescription medicine to help prevent HIV infection. If you choose to take medicine to prevent HIV, you should first get tested for HIV. You should then be tested every 3 months for as long as you are taking the medicine. Pregnancy If you are about to stop having your period (premenopausal) and you may become pregnant, seek counseling before you get pregnant. Take 400 to 800 micrograms (mcg) of folic acid every day if you become pregnant. Ask for birth control (contraception) if you want to prevent pregnancy. Osteoporosis and menopause Osteoporosis is a disease in which the bones lose minerals and strength with aging. This can result in bone fractures. If you are 70 years old or older, or if you are at risk for osteoporosis and fractures, ask your health care provider if you should: Be screened for bone loss. Take a calcium or vitamin D supplement to lower your risk of fractures. Be given hormone replacement therapy (HRT) to treat symptoms of menopause. Follow these instructions at home: Alcohol use Do not drink alcohol if: Your health care provider tells you not to drink. You are pregnant, may be pregnant, or are planning to become pregnant. If you drink alcohol: Limit how much you have to: 0-1 drink a day. Know how much alcohol is in your drink. In the [place] one drink equals one 12 oz bottle of beer  (355 mL), one 5 oz glass of wine (148 mL), or one 1 oz glass of hard liquor (44 mL). Lifestyle Do not use any products that contain nicotine or tobacco. These products include cigarettes, chewing tobacco, and vaping devices, such as e-cigarettes. If you need help quitting, ask your health care provider. Do not use street drugs. Do not share needles. Ask your health care provider for help if you need support or information about quitting drugs. General instructions Schedule regular health, dental, and eye exams. Stay current with your vaccines. Tell your health care provider if: You often feel depressed. You have ever been abused or do not feel safe at home. Summary Adopting a healthy lifestyle and getting preventive care are important in promoting health and wellness. Follow your health care provider's instructions about healthy diet, exercising, and getting tested or screened for diseases. Follow your health care provider's instructions on monitoring your cholesterol and blood pressure. This information is  not intended to replace advice given to you by your health care provider. Make sure you discuss any questions you have with your health care provider. Document Revised: 12/21/2020 Document Reviewed: 12/21/2020 [employer] Patient Education  [employer].

## 2021-09-02 NOTE — Progress Notes (Signed)
Subjective:   [name] [name] is a 76 y.o. female who presents for Medicare Annual (Subsequent) preventive examination. I connected with  [name] on 09/02/21 by a audio enabled telemedicine application and verified that I am speaking with the correct person using two identifiers.  Patient Location: Home  Provider Location: Office/Clinic  I discussed the limitations of evaluation and management by telemedicine. The patient expressed understanding and agreed to proceed.  Review of Systems    Defer to PCP Cardiac Risk Factors include: advanced age (>50mn, >>27women);diabetes mellitus;hypertension     Objective:    Today's Vitals   09/02/21 0832  PainSc: 0-No pain   There is no height or weight on file to calculate BMI.  Advanced Directives 09/02/2021 02/13/2021 02/05/2021 12/22/2020 12/12/2020 09/01/2020 06/30/2020  Does Patient Have a Medical Advance Directive? No No No No No No No  Copy of Healthcare Power of Attorney in Chart? - - - - - - -  Would patient like information on creating a medical advance directive? No - Patient declined No - Patient declined - No - Patient declined - No - Patient declined No - Patient declined  Pre-existing out of facility DNR order (yellow form or pink MOST form) - - - - - - -    Current Medications (verified) Outpatient Encounter Medications as of 09/02/2021  Medication Sig   acetaminophen (TYLENOL) 500 MG tablet Take one tablet two times daily, by mouth, for knee  pain (Patient taking differently: Take 500 mg by mouth 2 (two) times daily as needed for moderate pain. Take one tablet two times daily, by mouth, for knee  pain)   allopurinol (ZYLOPRIM) 100 MG tablet Take by mouth.   aspirin EC 81 MG tablet Take 81 mg by mouth every morning.    blood glucose meter kit and supplies Dispense based on patient and insurance preference. Use to test three times daily (FOR ICD-10 E10.9, E11.9).   carvedilol (COREG) 25 MG tablet TAKE 1 TABLET BY MOUTH  TWICE DAILY WITH MEALS.   cetirizine (ZYRTEC) 10 MG tablet Take 1 tablet (10 mg total) by mouth daily.   colchicine 0.6 MG tablet Take 1 tablet (0.6 mg total) by mouth 2 (two) times daily. Take with food   diclofenac Sodium (VOLTAREN) 1 % GEL Apply 1 application topically 4 (four) times daily as needed (pain).   ezetimibe (ZETIA) 10 MG tablet TAKE ONE TABLET BY MOUTH ONCE DAILY.   fenofibrate (TRICOR) 145 MG tablet TAKE (1) TABLET BY MOUTH ONCE DAILY.   fluticasone (FLONASE) 50 MCG/ACT nasal spray Place 2 sprays into both nostrils daily.   furosemide (LASIX) 40 MG tablet TAKE (1) TABLET BY MOUTH EACH MORNING.   glipiZIDE (GLUCOTROL XL) 2.5 MG 24 hr tablet TAKE ONE TABLET BY MOUTH ONCE DAILY WITH BREAKFAST.   Insulin Pen Needle (LITETOUCH PEN NEEDLES) 31G X 8 MM MISC Use to inject insulin twice daily Dx e11.65   Lancets (ONETOUCH DELICA PLUS [id number] MISC USE TO CHECK BLOOD SUGAR THREE TIMES DAILY.   LIVALO 2 MG TABS TAKE ONE TABLET BY MOUTH ONCE DAILY AFTER SUPPER.   Multiple Vitamins-Minerals (MULTIVITAMIN PO) Take 1 tablet by mouth every morning.    NOVOLOG MIX 70/30 FLEXPEN (70-30) 100 UNIT/ML FlexPen INJECT 12 TO 15 UNITS SUBCUTANEOUSLY TWICE DAILY.   ONETOUCH ULTRA test strip USE TO CHECK BLOOD SUGAR 3 TIMES DAILY   potassium chloride (KLOR-CON) 10 MEQ tablet TAKE ONE TABLET BY MOUTH DAILY. DO NOT TAKE IF NOT TAKING  FUROSEMIDE.   sacubitril-valsartan (ENTRESTO) 24-26 MG Take 1 tablet by mouth 2 (two) times daily.   spironolactone (ALDACTONE) 25 MG tablet Take 0.5 tablets (12.5 mg total) by mouth daily.   cholecalciferol (VITAMIN D3) 25 MCG (1000 UNIT) tablet Take 1,000 Units by mouth daily. (Patient not taking: Reported on 09/02/2021)   gabapentin (NEURONTIN) 100 MG capsule Take 1 capsule (100 mg total) by mouth 3 (three) times daily. (Patient not taking: Reported on 09/02/2021)   [DISCONTINUED] ferrous sulfate 325 (65 FE) MG EC tablet Take 1 tablet (325 mg total) by mouth 2 (two) times  daily.   [DISCONTINUED] FLUoxetine (PROZAC) 10 MG tablet Take 1 tablet (10 mg total) by mouth daily. Take one capsule by mouth once a day   [DISCONTINUED] predniSONE (DELTASONE) 10 MG tablet Take 1 tablet (10 mg total) by mouth daily with breakfast.   No facility-administered encounter medications on file as of 09/02/2021.    Allergies (verified) Crestor [rosuvastatin], Tramadol, and Penicillins   History: Past Medical History:  Diagnosis Date   Acute hypoxemic respiratory failure due to COVID-19 ([place]) 06/29/2020   Anxiety    Aortic stenosis    Arthritis    Biventricular ICD (implantable cardioverter-defibrillator) in place    March 2021, [employer]  -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   23 mm Edwards Sapien 3 transcatheter heart valve placed via open right transfemoral approach   Symptomatic PVCs    Past Surgical History:  Procedure Laterality Date   BIV ICD INSERTION CRT-D N/A 10/28/2019   Procedure: BIV ICD INSERTION CRT-D;  Surgeon: TaEvans LanceMD;  [facility];  Service: Cardiovascular;  Laterality: N/A;   CARDIAC CATHETERIZATION     CESAREAN SECTION  1987   FLEXIBLE BRONCHOSCOPY N/A 01/14/2015   Procedure: FLEXIBLE BRONCHOSCOPY;  Surgeon: StMelrose NakayamaMD;  [facility] Service: Thoracic;  Laterality: N/A;   LEFT AND RIGHT HEART CATHETERIZATION WITH CORONARY ANGIOGRAM N/A  12/05/2011   Procedure: LEFT AND RIGHT HEART CATHETERIZATION WITH CORONARY ANGIOGRAM;  Surgeon: ChBurnell BlanksMD;  [facility];  Service: Cardiovascular;  Laterality: N/A;   LEFT AND RIGHT HEART CATHETERIZATION WITH CORONARY ANGIOGRAM N/A 05/23/2014   Procedure: LEFT AND RIGHT HEART CATHETERIZATION WITH CORONARY ANGIOGRAM;  Surgeon: ChBurnell BlanksMD;  [facility];  Service: Cardiovascular;  Laterality: N/A;   MULTIPLE EXTRACTIONS WITH ALVEOLOPLASTY N/A 02/27/2014   Procedure: Extraction of tooth #'s 2,4,5,6,7,8,9,10,11,12, 22, 23, 24, 25, 26, 28 with alveoloplasty and bilateral mandibular tori reductions;  Surgeon: RoLenn CalDDS;  [facility] Service: Oral Surgery;  Laterality: N/A;   RIGID BRONCHOSCOPY N/A 01/14/2015   Procedure: RIGID BRONCHOSCOPY with Removal Of Foreign Body ;  Surgeon: StMelrose NakayamaMD;  [place] [facility] Service: Thoracic;  Laterality: N/A;  TEE WITHOUT CARDIOVERSION N/A 03/24/2015   Procedure: TRANSESOPHAGEAL ECHOCARDIOGRAM (TEE);  Surgeon: [name], MD;  Location: [place];  Service: Open Heart Surgery;  Laterality: N/A;   TRANSCATHETER AORTIC VALVE REPLACEMENT, TRANSFEMORAL Bilateral 03/24/2015   Procedure: TRANSCATHETER AORTIC VALVE REPLACEMENT, TRANSFEMORAL;  Surgeon: [name], MD;  Location: [place];  Service: Open Heart Surgery;  Laterality: Bilateral;   TUBAL LIGATION  1987   VIDEO BRONCHOSCOPY Bilateral 06/05/2014   Procedure: VIDEO BRONCHOSCOPY WITHOUT FLUORO;  Surgeon: [name], MD;  Location: [place];  Service: Cardiopulmonary;  Laterality: Bilateral;   Family History  Problem Relation Age of Onset   Heart disease Mother    Heart attack Mother 66   Diabetes Mother    Hypertension Mother    Colon cancer Father 20   Breast cancer Sister    Diabetes Brother    Hypertension Brother    Stroke Brother    Cancer Brother    Diabetes Daughter    Congestive Heart Failure  Daughter    Hypertension Son    High Cholesterol Son    Congestive Heart Failure Son    Social History   Socioeconomic History   Marital status: Widowed    Spouse name: Not on file   Number of children: 5   Years of education: 11th   Highest education level: Not on file  Occupational History   Occupation: Retired  Tobacco Use   Smoking status: Never   Smokeless tobacco: Never  Vaping Use   Vaping Use: Never used  Substance and Sexual Activity   Alcohol use: No   Drug use: No   Sexual activity: Never    Birth control/protection: None, Post-menopausal  Other Topics Concern   Not on file  Social History Narrative   Not on file   Social Determinants of Health   Financial Resource Strain: Low Risk    Difficulty of Paying Living Expenses: Not hard at all  Food Insecurity: No Food Insecurity   Worried About Charity fundraiser in the Last Year: Never true   [place] in the Last Year: Never true  Transportation Needs: No Transportation Needs   Lack of Transportation (Medical): No   Lack of Transportation (Non-Medical): No  Physical Activity: Insufficiently Active   Days of Exercise per Week: 5 days   Minutes of Exercise per Session: 20 min  Stress: No Stress Concern Present   Feeling of Stress : Not at all  Social Connections: Moderately Integrated   Frequency of Communication with Friends and Family: More than three times a week   Frequency of Social Gatherings with Friends and Family: Three times a week   Attends Religious Services: More than 4 times per year   Active Member of Clubs or Organizations: Yes   Attends Archivist Meetings: Never   Marital Status: Widowed    Tobacco Counseling Counseling given: Not Answered   Clinical Intake:  Pre-visit preparation completed: No  Pain : No/denies pain Pain Score: 0-No pain     Nutritional Risks: None Diabetes: Yes CBG done?: No Did pt. bring in CBG monitor from home?: No  How often do you  need to have someone help you when you read instructions, pamphlets, or other written materials from your doctor or pharmacy?: 1 - Never What is the last grade level you completed in school?: 11th  Diabetic?Nutrition Risk Assessment:  Has the patient had any N/V/D within the last 2 months?  No  Does the  patient have any non-healing wounds?  No  Has the patient had any unintentional weight loss or weight gain?  No   Diabetes:  Is the patient diabetic?  Yes  If diabetic, was a CBG obtained today?  No  Did the patient bring in their glucometer from home?  No  How often do you monitor your CBG's? Twice daily.   Financial Strains and Diabetes Management:  Are you having any financial strains with the device, your supplies or your medication? No .  Does the patient want to be seen by Chronic Care Management for management of their diabetes?  No  Would the patient like to be referred to a Nutritionist or for Diabetic Management?  No   Diabetic Exams:  Diabetic Eye Exam: Overdue for diabetic eye exam. Pt has been advised about the importance in completing this exam. Patient advised to call and schedule an eye exam. Diabetic Foot Exam: Completed 02/24/2021    Interpreter Needed?: No  Information entered by :: Lyncoln Maskell   Activities of Daily Living In your present state of health, do you have any difficulty performing the following activities: 09/02/2021  Hearing? N  Vision? N  Difficulty concentrating or making decisions? N  Walking or climbing stairs? Y  Dressing or bathing? N  Doing errands, shopping? N  Preparing Food and eating ? N  Using the Toilet? N  In the past six months, have you accidently leaked urine? Y  Do you have problems with loss of bowel control? N  Managing your Medications? N  Managing your Finances? N  Housekeeping or managing your Housekeeping? N  Some recent data might be hidden    Patient Care Team: Fayrene Helper, MD as PCP - General [name]  Aloha Gell, MD as PCP - Cardiology (Cardiology) Evans Lance, MD (Cardiology) [name], MD as Consulting Physician (Cardiology)  Indicate any recent Medical Services you may have received from other than [facility] providers in the past year (date may be approximate).     Assessment:   This is a routine wellness examination for [facility].  Hearing/Vision screen No results found.  Dietary issues and exercise activities discussed: Current Exercise Habits: Home exercise routine, Type of exercise: walking, Time (Minutes): 10, Frequency (Times/Week): 7, Weekly Exercise (Minutes/Week): 70, Intensity: Mild   Goals Addressed             This Visit's Progress    Exercise 3x per week (30 min per time)   On track    Patient would like to increase her exercise to 4 days a weeks for 20-30 minutes at a time.       Depression Screen PHQ 2/9 Scores 09/02/2021 08/23/2021 07/07/2021 02/24/2021 02/08/2021 10/21/2020 09/01/2020  PHQ - 2 Score 0 0 0 1 1 0 0  PHQ- 9 Score 0 0 0 2 - - -    Fall Risk Fall Risk  09/02/2021 08/23/2021 07/07/2021 02/24/2021 02/08/2021  Falls in the past year? 0 0 0 0 0  Number falls in past yr: 0 0 0 0 0  Injury with Fall? 0 0 0 0 0  Risk for fall due to : - No Fall Risks - No Fall Risks No Fall Risks  Follow up Falls evaluation completed Falls evaluation completed - Falls evaluation completed Falls evaluation completed    FALL RISK PREVENTION PERTAINING TO THE HOME:  Any stairs in or around the home? No  If so, are there any without handrails?  N/a Home free of loose  throw rugs in walkways, pet beds, electrical cords, etc? No  Adequate lighting in your home to reduce risk of falls? Yes   ASSISTIVE DEVICES UTILIZED TO PREVENT FALLS:  Life alert? No  Use of a cane, walker or w/c? Yes  Grab bars in the bathroom? Yes  Shower chair or bench in shower? Yes  Elevated toilet seat or a handicapped toilet? No    Cognitive Function:     6CIT Screen 09/02/2021 09/01/2020  02/06/2019 02/01/2018 09/29/2016  What Year? 0 points 0 points 0 points 0 points 0 points  What month? 0 points 0 points 0 points 0 points 0 points  What time? 0 points 0 points 0 points 0 points 0 points  Count back from 20 0 points 0 points 0 points 0 points 0 points  Months in reverse 0 points 0 points 0 points 0 points 0 points  Repeat phrase 0 points 0 points 2 points 0 points 0 points  Total Score 0 0 2 0 0    Immunizations Immunization History  Administered Date(s) Administered   Fluad Quad(high Dose 65+) 05/13/2019, 07/07/2021   Influenza Split 06/30/2011   Influenza Whole 09/18/2007, 05/28/2009, 06/15/2010   Influenza, High Dose Seasonal PF 06/27/2018   Influenza,inj,Quad PF,6+ Mos 05/21/2013, 04/23/2014, 08/18/2015, 06/23/2016, 07/31/2017, 05/11/2020, 07/07/2021   Influenza-Unspecified 04/15/2014   Pneumococcal Conjugate-13 09/16/2014   Pneumococcal Polysaccharide-23 05/24/2004, 12/20/2010   Td 05/24/2004   Tdap 06/30/2011    TDAP status: Due, Education has been provided regarding the importance of this vaccine. Advised may receive this vaccine at local pharmacy or Health Dept. Aware to provide a copy of the vaccination record if obtained from local pharmacy or Health Dept. Verbalized acceptance and understanding.  Flu Vaccine status: Up to date  Pneumococcal vaccine status: Up to date  Covid-19 vaccine status: Information provided on how to obtain vaccines.   Qualifies for Shingles Vaccine? Yes   Zostavax completed No   Shingrix Completed?: No.    Education has been provided regarding the importance of this vaccine. Patient has been advised to call insurance company to determine out of pocket expense if they have not yet received this vaccine. Advised may also receive vaccine at local pharmacy or Health Dept. Verbalized acceptance and understanding.  Screening Tests Health Maintenance  Topic Date Due   COVID-19 Vaccine (1) Never done   Zoster Vaccines- Shingrix (1 of  2) Never done   DEXA SCAN  Never done   OPHTHALMOLOGY EXAM  05/31/2017   TETANUS/TDAP  06/29/2021   HEMOGLOBIN A1C  01/04/2022   FOOT EXAM  02/24/2022   Pneumonia Vaccine 43+ Years old  Completed   INFLUENZA VACCINE  Completed   Hepatitis C Screening  Completed   HPV VACCINES  Aged Out    Health Maintenance  Health Maintenance Due  Topic Date Due   COVID-19 Vaccine (1) Never done   Zoster Vaccines- Shingrix (1 of 2) Never done   DEXA SCAN  Never done   OPHTHALMOLOGY EXAM  05/31/2017   TETANUS/TDAP  06/29/2021    Colorectal cancer screening: No longer required.   Mammogram status: No longer required due to age.  Bone Density status: Ordered 09/02/2021. Pt provided with contact info and advised to call to schedule appt.  Lung Cancer Screening: (Low Dose CT Chest recommended if Age 91-80 years, 30 pack-year currently smoking OR have quit w/in 15years.) does not qualify.   Lung Cancer Screening Referral: n/a  Additional Screening:  Hepatitis C Screening: does qualify; Completed 10/07/2015  Vision Screening: Recommended annual ophthalmology exams for early detection of glaucoma and other disorders of the eye. Is the patient up to date with their annual eye exam?  No  Who is the provider or what is the name of the office in which the patient attends annual eye exams? [facility] Linna Hoff If pt is not established with a provider, would they like to be referred to a provider to establish care?  Appt in Feb .   Dental Screening: Recommended annual dental exams for proper oral hygiene  Community Resource Referral / Chronic Care Management: CRR required this visit?  No   CCM required this visit?  No      Plan:     I have personally reviewed and noted the following in the patients chart:   Medical and social history Use of alcohol, tobacco or illicit drugs  Current medications and supplements including opioid prescriptions.  Functional ability and status Nutritional  status Physical activity Advanced directives List of other physicians Hospitalizations, surgeries, and ER visits in previous 12 months Vitals Screenings to include cognitive, depression, and falls Referrals and appointments  In addition, I have reviewed and discussed with patient certain preventive protocols, quality metrics, and best practice recommendations. A written personalized care plan for preventive services as well as general preventive health recommendations were provided to patient.     Earline Mayotte, [place]   09/02/2021   Nurse Notes:  Ms. Takach , Thank you for taking time to come for your Medicare Wellness Visit. I appreciate your ongoing commitment to your health goals. Please review the following plan we discussed and let me know if I can assist you in the future.   These are the goals we discussed:  Goals      Exercise 3x per week (30 min per time)     Patient would like to increase her exercise to 4 days a weeks for 20-30 minutes at a time.         This is a list of the screening recommended for you and due dates:  Health Maintenance  Topic Date Due   COVID-19 Vaccine (1) Never done   Zoster (Shingles) Vaccine (1 of 2) Never done   DEXA scan (bone density measurement)  Never done   Eye exam for diabetics  05/31/2017   Tetanus Vaccine  06/29/2021   Hemoglobin A1C  01/04/2022   Complete foot exam   02/24/2022   Pneumonia Vaccine  Completed   Flu Shot  Completed   Hepatitis C Screening: USPSTF Recommendation to screen - Ages 18-79 yo.  Completed   HPV Vaccine  Aged Out

## 2021-09-03 ENCOUNTER — Other Ambulatory Visit: Payer: Self-pay | Admitting: Cardiology

## 2021-09-10 DIAGNOSIS — I5042 Chronic combined systolic (congestive) and diastolic (congestive) heart failure: Secondary | ICD-10-CM | POA: Diagnosis not present

## 2021-09-10 DIAGNOSIS — N189 Chronic kidney disease, unspecified: Secondary | ICD-10-CM | POA: Diagnosis not present

## 2021-09-10 DIAGNOSIS — E559 Vitamin D deficiency, unspecified: Secondary | ICD-10-CM | POA: Diagnosis not present

## 2021-09-10 DIAGNOSIS — I129 Hypertensive chronic kidney disease with stage 1 through stage 4 chronic kidney disease, or unspecified chronic kidney disease: Secondary | ICD-10-CM | POA: Diagnosis not present

## 2021-09-10 DIAGNOSIS — E611 Iron deficiency: Secondary | ICD-10-CM | POA: Diagnosis not present

## 2021-09-10 DIAGNOSIS — E1122 Type 2 diabetes mellitus with diabetic chronic kidney disease: Secondary | ICD-10-CM | POA: Diagnosis not present

## 2021-09-13 ENCOUNTER — Other Ambulatory Visit: Payer: Self-pay | Admitting: Nurse Practitioner

## 2021-09-13 ENCOUNTER — Other Ambulatory Visit: Payer: Self-pay | Admitting: Family Medicine

## 2021-09-14 ENCOUNTER — Telehealth: Payer: Self-pay | Admitting: Family Medicine

## 2021-09-14 NOTE — Telephone Encounter (Signed)
Please have this called in for refill Livalo   Please call the pt to advise if she should be taking this and what it is for

## 2021-09-15 ENCOUNTER — Other Ambulatory Visit: Payer: Self-pay | Admitting: Family Medicine

## 2021-09-15 DIAGNOSIS — E1122 Type 2 diabetes mellitus with diabetic chronic kidney disease: Secondary | ICD-10-CM | POA: Diagnosis not present

## 2021-09-15 DIAGNOSIS — I129 Hypertensive chronic kidney disease with stage 1 through stage 4 chronic kidney disease, or unspecified chronic kidney disease: Secondary | ICD-10-CM | POA: Diagnosis not present

## 2021-09-15 DIAGNOSIS — N189 Chronic kidney disease, unspecified: Secondary | ICD-10-CM | POA: Diagnosis not present

## 2021-09-15 DIAGNOSIS — I5042 Chronic combined systolic (congestive) and diastolic (congestive) heart failure: Secondary | ICD-10-CM | POA: Diagnosis not present

## 2021-09-15 DIAGNOSIS — D638 Anemia in other chronic diseases classified elsewhere: Secondary | ICD-10-CM | POA: Diagnosis not present

## 2021-09-15 NOTE — Telephone Encounter (Signed)
Pt aware and will pick up today

## 2021-09-21 ENCOUNTER — Other Ambulatory Visit: Payer: Self-pay | Admitting: Family Medicine

## 2021-09-21 ENCOUNTER — Other Ambulatory Visit: Payer: Self-pay | Admitting: Nurse Practitioner

## 2021-09-23 ENCOUNTER — Ambulatory Visit (HOSPITAL_COMMUNITY)
Admission: RE | Admit: 2021-09-23 | Discharge: 2021-09-23 | Disposition: A | Discharge status: Home / Self Care | Payer: PPO | Source: Ambulatory Visit | Attending: Physician Assistant | Admitting: Physician Assistant

## 2021-09-23 DIAGNOSIS — I5042 Chronic combined systolic (congestive) and diastolic (congestive) heart failure: Secondary | ICD-10-CM | POA: Insufficient documentation

## 2021-09-23 LAB — ECHOCARDIOGRAM COMPLETE
AR max vel: 0.61 cm2
AV Area VTI: 0.54 cm2
AV Area mean vel: 0.58 cm2
AV Mean grad: 12.5 mmHg
AV Peak grad: 22.2 mmHg
Ao pk vel: 2.36 m/s
Area-P 1/2: 3.37 cm2
Calc EF: 26 %
MV M vel: 5 m/s
MV Peak grad: 100 mmHg
S' Lateral: 5.5 cm
Single Plane A2C EF: 31.8 %
Single Plane A4C EF: 15.3 %

## 2021-09-23 NOTE — Progress Notes (Signed)
*  PRELIMINARY RESULTS* Echocardiogram 2D Echocardiogram has been performed.  [name] [name] 09/23/2021, 12:45 PM

## 2021-09-24 ENCOUNTER — Telehealth: Payer: Self-pay

## 2021-09-24 NOTE — Telephone Encounter (Signed)
Patient notified and verbalized understanding. Pt had no questions or concerns at this time 

## 2021-09-24 NOTE — Telephone Encounter (Signed)
-----   Message from [name] [name], [place] sent at 09/24/2021  7:20 AM EST ----- Echo shows continued very low heart function but valve gradients are stable. No changes, keep f/u. thanks

## 2021-09-28 ENCOUNTER — Other Ambulatory Visit: Payer: Self-pay | Admitting: Family Medicine

## 2021-10-04 LAB — HM DIABETES EYE EXAM

## 2021-10-05 ENCOUNTER — Other Ambulatory Visit: Payer: Self-pay | Admitting: Family Medicine

## 2021-10-06 ENCOUNTER — Encounter: Payer: Self-pay | Admitting: Family Medicine

## 2021-10-12 ENCOUNTER — Other Ambulatory Visit: Payer: Self-pay | Admitting: Family Medicine

## 2021-10-12 ENCOUNTER — Other Ambulatory Visit: Payer: Self-pay | Admitting: Nurse Practitioner

## 2021-10-22 ENCOUNTER — Other Ambulatory Visit: Payer: Self-pay | Admitting: Nurse Practitioner

## 2021-10-25 ENCOUNTER — Ambulatory Visit (INDEPENDENT_AMBULATORY_CARE_PROVIDER_SITE_OTHER): Payer: PPO

## 2021-10-25 DIAGNOSIS — I428 Other cardiomyopathies: Secondary | ICD-10-CM

## 2021-10-25 LAB — CUP PACEART REMOTE DEVICE CHECK
Battery Remaining Longevity: 93 mo
Battery Voltage: 2.96 V
Brady Statistic AP VP Percent: 10.88 %
Brady Statistic AP VS Percent: 0.27 %
Brady Statistic AS VP Percent: 86.63 %
Brady Statistic AS VS Percent: 2.23 %
Brady Statistic RA Percent Paced: 10.88 %
Brady Statistic RV Percent Paced: 0.36 %
Date Time Interrogation Session: 20230313033626
HighPow Impedance: 62 Ohm
Implantable Lead Implant Date: 20210315
Implantable Lead Implant Date: 20210315
Implantable Lead Implant Date: 20210315
Implantable Lead Location: 753858
Implantable Lead Location: 753859
Implantable Lead Location: 753860
Implantable Lead Model: 4598
Implantable Lead Model: 5076
Implantable Lead Model: 6935
Implantable Pulse Generator Implant Date: 20210315
Lead Channel Impedance Value: 184.154
Lead Channel Impedance Value: 195.429
Lead Channel Impedance Value: 216.367
Lead Channel Impedance Value: 237.865
Lead Channel Impedance Value: 257.018
Lead Channel Impedance Value: 342 Ohm
Lead Channel Impedance Value: 342 Ohm
Lead Channel Impedance Value: 399 Ohm
Lead Channel Impedance Value: 399 Ohm
Lead Channel Impedance Value: 418 Ohm
Lead Channel Impedance Value: 456 Ohm
Lead Channel Impedance Value: 532 Ohm
Lead Channel Impedance Value: 589 Ohm
Lead Channel Impedance Value: 665 Ohm
Lead Channel Impedance Value: 665 Ohm
Lead Channel Impedance Value: 817 Ohm
Lead Channel Impedance Value: 874 Ohm
Lead Channel Impedance Value: 931 Ohm
Lead Channel Pacing Threshold Amplitude: 0.625 V
Lead Channel Pacing Threshold Amplitude: 0.75 V
Lead Channel Pacing Threshold Amplitude: 0.75 V
Lead Channel Pacing Threshold Pulse Width: 0.4 ms
Lead Channel Pacing Threshold Pulse Width: 0.4 ms
Lead Channel Pacing Threshold Pulse Width: 0.4 ms
Lead Channel Sensing Intrinsic Amplitude: 15.5 mV
Lead Channel Sensing Intrinsic Amplitude: 15.5 mV
Lead Channel Sensing Intrinsic Amplitude: 2 mV
Lead Channel Sensing Intrinsic Amplitude: 2 mV
Lead Channel Setting Pacing Amplitude: 1.25 V
Lead Channel Setting Pacing Amplitude: 1.75 V
Lead Channel Setting Pacing Amplitude: 2 V
Lead Channel Setting Pacing Pulse Width: 0.4 ms
Lead Channel Setting Pacing Pulse Width: 0.4 ms
Lead Channel Setting Sensing Sensitivity: 0.3 mV

## 2021-11-01 ENCOUNTER — Other Ambulatory Visit: Payer: Self-pay | Admitting: Family Medicine

## 2021-11-01 ENCOUNTER — Other Ambulatory Visit: Payer: Self-pay | Admitting: Cardiology

## 2021-11-04 ENCOUNTER — Encounter: Payer: PPO | Admitting: Family Medicine

## 2021-11-05 NOTE — Progress Notes (Signed)
Remote ICD transmission.   

## 2021-11-09 ENCOUNTER — Other Ambulatory Visit: Payer: Self-pay | Admitting: Family Medicine

## 2021-11-09 ENCOUNTER — Other Ambulatory Visit: Payer: Self-pay | Admitting: Cardiology

## 2021-11-16 ENCOUNTER — Other Ambulatory Visit: Payer: Self-pay | Admitting: Family Medicine

## 2021-11-23 ENCOUNTER — Other Ambulatory Visit: Payer: Self-pay | Admitting: Family Medicine

## 2021-11-29 ENCOUNTER — Ambulatory Visit: Payer: PPO | Admitting: Orthopedic Surgery

## 2021-11-29 DIAGNOSIS — M1A0610 Idiopathic chronic gout, right knee, without tophus (tophi): Secondary | ICD-10-CM | POA: Diagnosis not present

## 2021-11-29 DIAGNOSIS — M25562 Pain in left knee: Secondary | ICD-10-CM

## 2021-11-29 DIAGNOSIS — G8929 Other chronic pain: Secondary | ICD-10-CM

## 2021-11-29 DIAGNOSIS — M25561 Pain in right knee: Secondary | ICD-10-CM

## 2021-11-29 NOTE — Patient Instructions (Signed)

## 2021-11-29 NOTE — Progress Notes (Signed)
FOLLOW UP  ? ?Encounter Diagnoses  ?Name Primary?  ? Chronic pain of left knee Yes  ? Chronic pain of right knee   ? Idiopathic chronic gout of right knee without tophus   ? ? ? ?Chief Complaint  ?Patient presents with  ? Knee Pain  ?  Bilateral L>R ?Pt states the pain isn't any better  ? ?76 year old female with history of aortic valve replacement cardiomyopathy type 2 diabetes coronary artery disease kidney disease stage IV bilateral knee pain history of gout currently on colchicine and allopurinol presents with complaints of swelling and pain left knee chronic pain right knee ? ?Procedure note for bilateral knee injections ? ?Procedure note left knee injection verbal consent was obtained to inject left knee joint ? ?Timeout was completed to confirm the site of injection ? ?The medications used were 40 mg depomedrol and 3 cc of 1% lidocaine  ?Anesthesia was provided by ethyl chloride and the skin was prepped with alcohol. ? ?After cleaning the skin with alcohol a 20-gauge needle was used to inject the left knee joint. There were no complications. A sterile bandage was applied. ? ? ?Procedure note injection and aspiration left knee joint ? ?Verbal consent was obtained to aspirate and inject the left knee joint  ? ?Timeout was completed to confirm the site of aspiration and injection ? ?An 18-gauge needle was used to aspirate the left knee joint from a suprapatellar lateral approach. ? ?The medications used were 40 mg of Depo-Medrol and 1% lidocaine 3 cc ? ?Anesthesia was provided by ethyl chloride and the skin was prepped with alcohol. ? ?After cleaning the skin with alcohol an 18-gauge needle was used to aspirate the right knee joint. ? ?We obtained 0 cc of fluid  ? ?We followed this by injection of 40 mg of Depo-Medrol and 3 cc 1% lidocaine. ? ?There were no complications. A sterile bandage was applied.  ? ?Follow-up as needed ?

## 2021-11-30 ENCOUNTER — Other Ambulatory Visit: Payer: Self-pay | Admitting: Family Medicine

## 2021-12-01 ENCOUNTER — Ambulatory Visit (INDEPENDENT_AMBULATORY_CARE_PROVIDER_SITE_OTHER): Payer: PPO | Admitting: Family Medicine

## 2021-12-01 ENCOUNTER — Encounter: Payer: Self-pay | Admitting: Family Medicine

## 2021-12-01 DIAGNOSIS — J302 Other seasonal allergic rhinitis: Secondary | ICD-10-CM | POA: Diagnosis not present

## 2021-12-01 MED ORDER — CETIRIZINE HCL 10 MG PO TABS: 10.0000 mg | ORAL_TABLET | Freq: Every day | ORAL | 11 refills | Status: DC

## 2021-12-01 NOTE — Patient Instructions (Signed)
F/U as before, call if you need me sooner ? ?You are treated for uncontrolled allergy symptomsand zyrtec/ certrizine is prescribed, take daily ? ?Thanks for choosing [facility], we consider it a privelige to serve you. ? ?

## 2021-12-01 NOTE — Assessment & Plan Note (Signed)
Uncontrolled and symptomatic, zyrtec prescribed ?

## 2021-12-01 NOTE — Progress Notes (Signed)
Virtual Visit via Telephone Note ? ?I connected with [name] [name] on 12/01/21 at 11:40 AM EDT by telephone and verified that I am speaking with the correct person using two identifiers. ? ?Location: ?Patient: home ?Provider: office ?  ?I discussed the limitations, risks, security and privacy concerns of performing an evaluation and management service by telephone and the availability of in person appointments. I also discussed with the patient that there may be a patient responsible charge related to this service. The patient expressed understanding and agreed to proceed. ? ? ?History of Present Illness: ?2 day h/o clear nasal drainage and cough productive of clear sputum. No fver or chills, increased watery eyes, and out of allergy meds ?  ?Observations/Objective: ?There were no vitals taken for this visit. ?Good communication with no confusion and intact memory. ?Alert and oriented x 3 ?No signs of respiratory distress during speech ? ? ?Assessment and Plan: ? ?Seasonal allergies ?Uncontrolled and symptomatic, zyrtec prescribed ? ?Follow Up Instructions: ? ?  ?I discussed the assessment and treatment plan with the patient. The patient was provided an opportunity to ask questions and all were answered. The patient agreed with the plan and demonstrated an understanding of the instructions. ?  ?The patient was advised to call back or seek an in-person evaluation if the symptoms worsen or if the condition fails to improve as anticipated. ? ?I provided 6 minutes of non-face-to-face time during this encounter. ? ? ?[name], MD ? ?

## 2021-12-02 ENCOUNTER — Ambulatory Visit: Payer: PPO | Admitting: Orthopedic Surgery

## 2021-12-07 ENCOUNTER — Encounter: Payer: Self-pay | Admitting: Family Medicine

## 2021-12-07 ENCOUNTER — Ambulatory Visit (INDEPENDENT_AMBULATORY_CARE_PROVIDER_SITE_OTHER): Payer: PPO | Admitting: Family Medicine

## 2021-12-07 VITALS — BP 115/73 | HR 78 | Resp 16 | Ht 66.0 in | Wt 192.4 lb

## 2021-12-07 DIAGNOSIS — N184 Chronic kidney disease, stage 4 (severe): Secondary | ICD-10-CM | POA: Diagnosis not present

## 2021-12-07 DIAGNOSIS — I10 Essential (primary) hypertension: Secondary | ICD-10-CM | POA: Diagnosis not present

## 2021-12-07 DIAGNOSIS — E1169 Type 2 diabetes mellitus with other specified complication: Secondary | ICD-10-CM

## 2021-12-07 DIAGNOSIS — I129 Hypertensive chronic kidney disease with stage 1 through stage 4 chronic kidney disease, or unspecified chronic kidney disease: Secondary | ICD-10-CM

## 2021-12-07 DIAGNOSIS — E785 Hyperlipidemia, unspecified: Secondary | ICD-10-CM | POA: Diagnosis not present

## 2021-12-07 DIAGNOSIS — J309 Allergic rhinitis, unspecified: Secondary | ICD-10-CM | POA: Diagnosis not present

## 2021-12-07 DIAGNOSIS — E1122 Type 2 diabetes mellitus with diabetic chronic kidney disease: Secondary | ICD-10-CM

## 2021-12-07 DIAGNOSIS — E559 Vitamin D deficiency, unspecified: Secondary | ICD-10-CM | POA: Diagnosis not present

## 2021-12-07 DIAGNOSIS — Z0001 Encounter for general adult medical examination with abnormal findings: Secondary | ICD-10-CM

## 2021-12-07 MED ORDER — PREDNISONE 10 MG PO TABS: 10.0000 mg | ORAL_TABLET | Freq: Two times a day (BID) | ORAL | 0 refills | Status: DC

## 2021-12-07 MED ORDER — SPIRONOLACTONE 25 MG PO TABS: 12.5000 mg | ORAL_TABLET | Freq: Every day | ORAL | 3 refills | Status: DC

## 2021-12-07 MED ORDER — FENOFIBRATE 145 MG PO TABS: ORAL_TABLET | ORAL | 3 refills | Status: DC

## 2021-12-07 MED ORDER — POTASSIUM CHLORIDE ER 10 MEQ PO TBCR: EXTENDED_RELEASE_TABLET | ORAL | 3 refills | Status: DC

## 2021-12-07 MED ORDER — MONTELUKAST SODIUM 10 MG PO TABS: 10.0000 mg | ORAL_TABLET | Freq: Every day | ORAL | 3 refills | Status: DC

## 2021-12-07 MED ORDER — FUROSEMIDE 40 MG PO TABS: ORAL_TABLET | ORAL | 3 refills | Status: DC

## 2021-12-07 NOTE — Patient Instructions (Addendum)
F./U in 4 months, call if you need me sooner ? ?Please schedule bone denisity test a heckout, ordered in 08/2021 ? ?Prednisone and montelukast  are prescribed for cough and allergies ? ?Labs today lipid, cmp and EGFR, HBA1C, CBC, TSH and vit D sand microalb today ? ?Please get shingrix vaccines at your pharmacy ? ?Thanks for choosing [facility], we consider it a privelige to serve you. ? ?

## 2021-12-07 NOTE — Assessment & Plan Note (Signed)

## 2021-12-07 NOTE — Assessment & Plan Note (Signed)
Uncontrolled, add daily singulair and short course of prednisone is also pescribed ?

## 2021-12-07 NOTE — Progress Notes (Signed)
? ? ?  [name] [name]     MRN: [id number]      DOB: 1945/08/22 ? ?HPI: ?Patient is in for annual physical exam. ?1 week h/o increased nasal congestion ad sinus drainage also cough productive of thisck white sputum ?Immunization is reviewed , and  updated if needed. ? ? ?PE: ?BP 115/73   Pulse 78   Resp 16   Ht 5\' 6"  (1.676 m)   Wt 192 lb 6.4 oz (87.3 kg)   SpO2 94%   BMI 31.05 kg/m?  ? ?Pleasant  female, alert and oriented x 3, in no cardio-pulmonary distress. ?Afebrile. ?HEENT ?No facial trauma or asymetry. Sinuses non tender. Nasal mucosa slightly erythematous and edematous, TM clear, no sinus tenderness, no cervical adenopathy ?Extra occullar muscles intact.Marland Kitchen ?External ears normal, . ?Neck: supple, no adenopathy,JVD or thyromegaly.No bruits. ? ?Chest: ?Clear to ascultation bilaterally.No crackles or wheezes. ?Non tender to palpation ? ? ?Cardiovascular system; ?Heart sounds normal,  S1 and  S2 ,no S3.  murmur, or thrill. ?Apical beat not displaced ?Peripheral pulses normal. ? ?Abdomen: ?Soft, non tender, no organomegaly or masses. ?No bruits. ?Bowel sounds normal. ?No guarding, tenderness or rebound. ? ? ? ?Musculoskeletal exam: ?Decreased ROM of spine, hips , shoulders and knees. ? deformity ,swelling and  crepitus noted., ambulates with a cane ?No muscle wasting or atrophy.  ? ?Neurologic: ?Cranial nerves 2 to 12 intact. ?Power, tone ,sensation and reflexes normal throughout. ?disturbance in gait. No tremor. ? ?Skin: ?Intact, no ulceration, erythema , scaling or rash noted. ?Pigmentation normal throughout ? ?Psych; ?Normal mood and affect. Judgement and concentration normal ? ? ?Assessment & Plan:  ?Encounter for Medicare annual examination with abnormal findings ?Annual exam as documented. ?Counseling done  re healthy lifestyle involving commitment to 150 minutes exercise per week, heart healthy diet, and attaining healthy weight.The importance of adequate sleep also discussed. ?Regular seat belt use and  home safety, is also discussed. ?Changes in health habits are decided on by the patient with goals and time frames  set for achieving them. ?Immunization and cancer screening needs are specifically addressed at this visit. ? ? ?Acute allergic rhinitis ?Uncontrolled, add daily singulair and short course of prednisone is also pescribed ? ?

## 2021-12-09 ENCOUNTER — Other Ambulatory Visit: Payer: Self-pay | Admitting: Family Medicine

## 2021-12-09 DIAGNOSIS — N184 Chronic kidney disease, stage 4 (severe): Secondary | ICD-10-CM

## 2021-12-09 LAB — CBC
Hematocrit: 36.3 % (ref 34.0–46.6)
Hemoglobin: 11.7 g/dL (ref 11.1–15.9)
MCH: 28.1 pg (ref 26.6–33.0)
MCHC: 32.2 g/dL (ref 31.5–35.7)
MCV: 87 fL (ref 79–97)
Platelets: 227 10*3/uL (ref 150–450)
RBC: 4.17 x10E6/uL (ref 3.77–5.28)
RDW: 13.6 % (ref 11.7–15.4)
WBC: 8.3 10*3/uL (ref 3.4–10.8)

## 2021-12-09 LAB — TSH: TSH: 1.51 u[IU]/mL (ref 0.450–4.500)

## 2021-12-09 LAB — CMP14+EGFR
ALT: 17 IU/L (ref 0–32)
AST: 25 IU/L (ref 0–40)
Albumin/Globulin Ratio: 1.8 (ref 1.2–2.2)
Albumin: 4.1 g/dL (ref 3.7–4.7)
Alkaline Phosphatase: 50 IU/L (ref 44–121)
BUN/Creatinine Ratio: 22 (ref 12–28)
BUN: 51 mg/dL — ABNORMAL HIGH (ref 8–27)
Bilirubin Total: 0.3 mg/dL (ref 0.0–1.2)
CO2: 22 mmol/L (ref 20–29)
Calcium: 9.4 mg/dL (ref 8.7–10.3)
Chloride: 109 mmol/L — ABNORMAL HIGH (ref 96–106)
Creatinine, Ser: 2.36 mg/dL — ABNORMAL HIGH (ref 0.57–1.00)
Globulin, Total: 2.3 g/dL (ref 1.5–4.5)
Glucose: 124 mg/dL — ABNORMAL HIGH (ref 70–99)
Potassium: 4.9 mmol/L (ref 3.5–5.2)
Sodium: 145 mmol/L — ABNORMAL HIGH (ref 134–144)
Total Protein: 6.4 g/dL (ref 6.0–8.5)
eGFR: 21 mL/min/{1.73_m2} — ABNORMAL LOW (ref >59)

## 2021-12-09 LAB — MICROALBUMIN / CREATININE URINE RATIO
Creatinine, Urine: 55.8 mg/dL
Microalb/Creat Ratio: 12 mg/g creat (ref 0–29)
Microalbumin, Urine: 6.6 ug/mL

## 2021-12-09 LAB — LIPID PANEL
Chol/HDL Ratio: 2.6 ratio (ref 0.0–4.4)
Cholesterol, Total: 136 mg/dL (ref 100–199)
HDL: 52 mg/dL (ref >39)
LDL Chol Calc (NIH): 65 mg/dL (ref 0–99)
Triglycerides: 105 mg/dL (ref 0–149)
VLDL Cholesterol Cal: 19 mg/dL (ref 5–40)

## 2021-12-09 LAB — HEMOGLOBIN A1C
Est. average glucose Bld gHb Est-mCnc: 169 mg/dL
Hgb A1c MFr Bld: 7.5 % — ABNORMAL HIGH (ref 4.8–5.6)

## 2021-12-09 LAB — VITAMIN D 25 HYDROXY (VIT D DEFICIENCY, FRACTURES): Vit D, 25-Hydroxy: 39 ng/mL (ref 30.0–100.0)

## 2021-12-10 ENCOUNTER — Other Ambulatory Visit: Payer: Self-pay | Admitting: Family Medicine

## 2021-12-14 ENCOUNTER — Telehealth: Payer: Self-pay

## 2021-12-14 NOTE — Telephone Encounter (Signed)
Patient returning call, she does not know who called, but she said probably regarding her bone density orders.  Call patient back # [phone]. ?

## 2021-12-14 NOTE — Telephone Encounter (Signed)
Called patient and advised her bone density is scheduled for tomorrow gave patient phone number to r/s appt. ?

## 2021-12-15 ENCOUNTER — Other Ambulatory Visit: Payer: Self-pay | Admitting: Family Medicine

## 2021-12-15 ENCOUNTER — Other Ambulatory Visit (HOSPITAL_COMMUNITY): Payer: PPO

## 2021-12-17 DIAGNOSIS — N189 Chronic kidney disease, unspecified: Secondary | ICD-10-CM | POA: Diagnosis not present

## 2021-12-17 DIAGNOSIS — I129 Hypertensive chronic kidney disease with stage 1 through stage 4 chronic kidney disease, or unspecified chronic kidney disease: Secondary | ICD-10-CM | POA: Diagnosis not present

## 2021-12-17 DIAGNOSIS — E1122 Type 2 diabetes mellitus with diabetic chronic kidney disease: Secondary | ICD-10-CM | POA: Diagnosis not present

## 2021-12-17 DIAGNOSIS — I5042 Chronic combined systolic (congestive) and diastolic (congestive) heart failure: Secondary | ICD-10-CM | POA: Diagnosis not present

## 2021-12-17 DIAGNOSIS — D638 Anemia in other chronic diseases classified elsewhere: Secondary | ICD-10-CM | POA: Diagnosis not present

## 2021-12-17 DIAGNOSIS — D529 Folate deficiency anemia, unspecified: Secondary | ICD-10-CM | POA: Diagnosis not present

## 2021-12-23 DIAGNOSIS — I5042 Chronic combined systolic (congestive) and diastolic (congestive) heart failure: Secondary | ICD-10-CM | POA: Diagnosis not present

## 2021-12-23 DIAGNOSIS — E1122 Type 2 diabetes mellitus with diabetic chronic kidney disease: Secondary | ICD-10-CM | POA: Diagnosis not present

## 2021-12-23 DIAGNOSIS — I129 Hypertensive chronic kidney disease with stage 1 through stage 4 chronic kidney disease, or unspecified chronic kidney disease: Secondary | ICD-10-CM | POA: Diagnosis not present

## 2021-12-23 DIAGNOSIS — D638 Anemia in other chronic diseases classified elsewhere: Secondary | ICD-10-CM | POA: Diagnosis not present

## 2021-12-23 DIAGNOSIS — N189 Chronic kidney disease, unspecified: Secondary | ICD-10-CM | POA: Diagnosis not present

## 2021-12-27 ENCOUNTER — Other Ambulatory Visit (HOSPITAL_COMMUNITY): Payer: PPO

## 2021-12-31 ENCOUNTER — Ambulatory Visit (HOSPITAL_COMMUNITY)
Admission: RE | Admit: 2021-12-31 | Discharge: 2021-12-31 | Disposition: A | Discharge status: Home / Self Care | Payer: PPO | Source: Ambulatory Visit | Attending: Family Medicine | Admitting: Family Medicine

## 2021-12-31 DIAGNOSIS — Z780 Asymptomatic menopausal state: Secondary | ICD-10-CM | POA: Diagnosis not present

## 2021-12-31 DIAGNOSIS — M810 Age-related osteoporosis without current pathological fracture: Secondary | ICD-10-CM | POA: Diagnosis not present

## 2022-01-02 NOTE — Progress Notes (Signed)
Cardiology Office Note  Date: 01/03/2022   ID: [name], [name] Nov 10, 1945, MRN [id number]  PCP:  Fayrene Helper, MD  Cardiologist:  Rozann Lesches, MD Electrophysiologist:  None   Chief Complaint  Patient presents with   Cardiac follow-up    History of Present Illness: [name] [name] is a 76 y.o. female last seen in December 2022 by [name].  She is here today with a family member for follow-up.  Reports NYHA class II dyspnea, no orthopnea or PND.  She is limited by arthritic pains in her legs and knees.  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.  Most recent device interrogation indicated normal function.  No device shocks or syncope.  Most recent echocardiogram in February 2023 revealed LVEF less than 20% with global hypokinesis, normal RV contraction, mild mitral regurgitation, stable TAVR prosthesis without paravalvular regurgitation and mean gradient approximately 13 mmHg.  I went over her current medications.  She continues to follow with nephrology, most recent creatinine 2.36 and potassium 4.9.  I personally reviewed her ECG today which shows a ventricular paced rhythm with atrial sensing.  Past Medical History:  Diagnosis Date   Acute hypoxemic respiratory failure due to COVID-19 ([place]) 06/29/2020   Anxiety    Aortic stenosis    Arthritis    Biventricular ICD (implantable cardioverter-defibrillator) in place    March 2021, Medtronic  -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   23 mm Edwards Sapien 3 transcatheter heart valve placed via open right transfemoral approach   Symptomatic PVCs     Past Surgical History:  Procedure Laterality Date   BIV ICD INSERTION CRT-D N/A 10/28/2019   Procedure: BIV ICD INSERTION CRT-D;  Surgeon: TaEvans LanceMD;  [facility];  Service: Cardiovascular;  Laterality: N/A;   CARDIAC CATHETERIZATION     CESAREAN SECTION  1987   FLEXIBLE BRONCHOSCOPY N/A 01/14/2015   Procedure: FLEXIBLE BRONCHOSCOPY;  Surgeon: StMelrose NakayamaMD;  [facility] Service: Thoracic;  Laterality: N/A;   LEFT AND RIGHT HEART CATHETERIZATION WITH CORONARY ANGIOGRAM N/A 12/05/2011   Procedure: LEFT AND RIGHT HEART CATHETERIZATION WITH CORONARY ANGIOGRAM;  Surgeon: ChBurnell BlanksMD;  [facility];  Service: Cardiovascular;  Laterality: N/A;   LEFT AND RIGHT HEART CATHETERIZATION WITH CORONARY ANGIOGRAM N/A 05/23/2014   Procedure: LEFT AND RIGHT HEART CATHETERIZATION WITH CORONARY ANGIOGRAM;  Surgeon: ChBurnell BlanksMD;  [facility];  Service: Cardiovascular;  Laterality: N/A;   MULTIPLE EXTRACTIONS WITH ALVEOLOPLASTY N/A 02/27/2014   Procedure: Extraction of tooth #'s 2,4,5,6,7,8,9,10,11,12, 22, 23, 24, 25, 26, 28 with alveoloplasty and bilateral mandibular tori reductions;  Surgeon: RoLenn CalDDS;  [facility] Service: Oral Surgery;  Laterality: N/A;   RIGID BRONCHOSCOPY N/A 01/14/2015  Procedure: RIGID BRONCHOSCOPY with Removal Of Foreign Body ;  Surgeon: [name], MD;  Location: [facility];  Service: Thoracic;  Laterality: N/A;   TEE WITHOUT CARDIOVERSION N/A 03/24/2015   Procedure: TRANSESOPHAGEAL ECHOCARDIOGRAM (TEE);  Surgeon: [name], MD;  Location: [facility];  Service: Open Heart Surgery;  Laterality: N/A;   TRANSCATHETER AORTIC VALVE REPLACEMENT,  TRANSFEMORAL Bilateral 03/24/2015   Procedure: TRANSCATHETER AORTIC VALVE REPLACEMENT, TRANSFEMORAL;  Surgeon: [name], MD;  Location: [facility];  Service: Open Heart Surgery;  Laterality: Bilateral;   TUBAL LIGATION  1987   VIDEO BRONCHOSCOPY Bilateral 06/05/2014   Procedure: VIDEO BRONCHOSCOPY WITHOUT FLUORO;  Surgeon: [name], MD;  Location: [place];  Service: Cardiopulmonary;  Laterality: Bilateral;    Current Outpatient Medications  Medication Sig Dispense Refill   acetaminophen (TYLENOL) 500 MG tablet Take one tablet two times daily, by mouth, for knee  pain (Patient taking differently: Take 500 mg by mouth 2 (two) times daily as needed for moderate pain. Take one tablet two times daily, by mouth, for knee  pain) 60 tablet 3   allopurinol (ZYLOPRIM) 100 MG tablet Take by mouth.     aspirin EC 81 MG tablet Take 81 mg by mouth every morning.      blood glucose meter kit and supplies Dispense based on patient and insurance preference. Use to test three times daily (FOR ICD-10 E10.9, E11.9). 1 each 0   carvedilol (COREG) 25 MG tablet TAKE 1 TABLET BY MOUTH TWICE DAILY WITH MEALS. 60 tablet 6   cetirizine (ZYRTEC) 10 MG tablet Take 1 tablet (10 mg total) by mouth daily. 30 tablet 11   colchicine 0.6 MG tablet Take 1 tablet (0.6 mg total) by mouth 2 (two) times daily. Take with food 60 tablet 5   diclofenac Sodium (VOLTAREN) 1 % GEL Apply 1 application topically 4 (four) times daily as needed (pain).     ENTRESTO 24-26 MG TAKE 1 TABLET BY MOUTH TWICE DAILY 60 tablet 6   ezetimibe (ZETIA) 10 MG tablet TAKE ONE TABLET BY MOUTH ONCE DAILY. 90 tablet 0   fenofibrate (TRICOR) 145 MG tablet TAKE (1) TABLET BY MOUTH ONCE DAILY. 30 tablet 3   furosemide (LASIX) 40 MG tablet TAKE (1) TABLET BY MOUTH EACH MORNING. 30 tablet 3   glipiZIDE (GLUCOTROL XL) 2.5 MG 24 hr tablet TAKE ONE TABLET BY MOUTH ONCE DAILY WITH BREAKFAST. 30 tablet 0   Insulin Pen Needle (LITETOUCH PEN NEEDLES)  31G X 8 MM MISC USE TWICE DAILY AS DIRECTED. 100 each 0   Lancets (ONETOUCH DELICA PLUS [id number]) MISC USE TO CHECK BLOOD SUGAR THREE TIMES DAILY. 100 each 0   LIVALO 2 MG TABS TAKE ONE TABLET BY MOUTH ONCE DAILY AFTER SUPPER. 90 tablet 0   Multiple Vitamins-Minerals (MULTIVITAMIN PO) Take 1 tablet by mouth every morning.      NOVOLOG MIX 70/30 FLEXPEN (70-30) 100 UNIT/ML FlexPen INJECT 12 TO 15 UNITS SUBCUTANEOUSLY TWICE DAILY. 15 mL 0   ONETOUCH ULTRA test strip USE TO CHECK BLOOD SUGAR 3 TIMES DAILY 200 strip 0   potassium chloride (KLOR-CON) 10 MEQ tablet TAKE ONE TABLET BY MOUTH DAILY. DO NOT TAKE IF NOT TAKING FUROSEMIDE. 30 tablet 3   spironolactone (ALDACTONE) 25 MG tablet Take 0.5 tablets (12.5 mg total) by mouth daily. 45 tablet 3   alendronate (FOSAMAX) 70 MG tablet Take 1 tablet (70 mg total) by mouth every 7 (seven) days. Take with a full glass of water on  an empty stomach. (Patient not taking: Reported on 01/03/2022) 4 tablet 11   cholecalciferol (VITAMIN D3) 25 MCG (1000 UNIT) tablet Take 1,000 Units by mouth daily. (Patient not taking: Reported on 01/03/2022)     montelukast (SINGULAIR) 10 MG tablet Take 1 tablet (10 mg total) by mouth at bedtime. (Patient not taking: Reported on 01/03/2022) 30 tablet 3   predniSONE (DELTASONE) 10 MG tablet Take 1 tablet (10 mg total) by mouth 2 (two) times daily with a meal. (Patient not taking: Reported on 01/03/2022) 10 tablet 0   No current facility-administered medications for this visit.   Allergies:  Crestor [rosuvastatin], Tramadol, and Penicillins   ROS: No leg swelling.  Physical Exam: VS:  BP 108/64   Pulse 74   Ht 5' 6"  (1.676 m)   Wt 193 lb 6.4 oz (87.7 kg)   SpO2 98%   BMI 31.22 kg/m , BMI Body mass index is 31.22 kg/m.  Wt Readings from Last 3 Encounters:  01/03/22 193 lb 6.4 oz (87.7 kg)  12/07/21 192 lb 6.4 oz (87.3 kg)  07/19/21 189 lb 9.6 oz (86 kg)    General: Patient appears comfortable at rest. HEENT: Conjunctiva  and lids normal. Neck: Supple, no elevated JVP or carotid bruits, no thyromegaly. Lungs: Clear to auscultation, nonlabored breathing at rest. Cardiac: Regular rate and rhythm, no S3, 2/6 systolic murmur. Extremities: No pitting edema.  ECG:  An ECG dated 12/22/2020 was personally reviewed today and demonstrated:  Sinus rhythm with PVC, left atrial enlargement, IVCD.  Recent Labwork: 12/07/2021: ALT 17; AST 25; BUN 51; Creatinine, Ser 2.36; Hemoglobin 11.7; Platelets 227; Potassium 4.9; Sodium 145; TSH 1.510     Component Value Date/Time   CHOL 136 12/07/2021 1543   TRIG 105 12/07/2021 1543   HDL 52 12/07/2021 1543   CHOLHDL 2.6 12/07/2021 1543   CHOLHDL 2.9 08/20/2019 1001   VLDL 21 02/06/2017 0916   LDLCALC 65 12/07/2021 1543   LDLCALC 94 08/20/2019 1001    Other Studies Reviewed Today:  Echocardiogram 09/23/2021:  1. EF remains severly depressed compared to echo done 12/21/20. Left  ventricular ejection fraction, by estimation, is <20%. The left ventricle  has severely decreased function. The left ventricle demonstrates global  hypokinesis. The left ventricular  internal cavity size was severely dilated. There is mild left ventricular  hypertrophy. Left ventricular diastolic parameters are indeterminate.   2. Pacing wires in RA/RV . Right ventricular systolic function is normal.  The right ventricular size is normal. There is normal pulmonary artery  systolic pressure.   3. Left atrial size was mildly dilated.   4. The mitral valve is abnormal. Mild mitral valve regurgitation. No  evidence of mitral stenosis.   5. 23 mm Sapien TAVR valve stable gradients no PVL . The aortic valve has  been repaired/replaced. Aortic valve regurgitation is not visualized. No  aortic stenosis is present.   6. The inferior vena cava is normal in size with greater than 50%  respiratory variability, suggesting right atrial pressure of 3 mmHg.   Assessment and Plan:  1.  HFrEF with nonischemic  cardiomyopathy, LVEF 15 to 20% and RV contraction normal.  She is clinically stable with NYHA class II dyspnea and no significant weight gain on current regimen.  Renal function limits up titration of therapy, for now we will continue Coreg, Entresto, Aldactone, and Lasix.  Consider changing Glucotrol XL to [place].  2.  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.  No device shocks  or syncope.  3.  CKD stage IIIb, follows with Dr. Theador Hawthorne.  Recent creatinine 2.36 and potassium 4.9.  4.  History of aortic stenosis status post TAVR in 2016.  Valve function is stable by recent echocardiogram.  Medication Adjustments/Labs and Tests Ordered: Current medicines are reviewed at length with the patient today.  Concerns regarding medicines are outlined above.   Tests Ordered: Orders Placed This Encounter  Procedures   EKG 12-Lead    Medication Changes: No orders of the defined types were placed in this encounter.   Disposition:  Follow up  6 months.  Signed, [name], MD, [facility] 01/03/2022 9:37 AM    [facility]. [address], [place], [place] [address] Phone: [phone]; Fax: [phone]

## 2022-01-03 ENCOUNTER — Ambulatory Visit: Payer: PPO | Admitting: Cardiology

## 2022-01-03 ENCOUNTER — Other Ambulatory Visit: Payer: Self-pay | Admitting: Family Medicine

## 2022-01-03 ENCOUNTER — Encounter: Payer: Self-pay | Admitting: Cardiology

## 2022-01-03 VITALS — BP 108/64 | HR 74 | Ht 66.0 in | Wt 193.4 lb

## 2022-01-03 DIAGNOSIS — I5020 Unspecified systolic (congestive) heart failure: Secondary | ICD-10-CM | POA: Diagnosis not present

## 2022-01-03 DIAGNOSIS — N1832 Chronic kidney disease, stage 3b: Secondary | ICD-10-CM

## 2022-01-03 MED ORDER — ALENDRONATE SODIUM 70 MG PO TABS: 70.0000 mg | ORAL_TABLET | ORAL | 11 refills | Status: DC

## 2022-01-03 NOTE — Patient Instructions (Signed)
Medication Instructions:  Your physician recommends that you continue on your current medications as directed. Please refer to the Current Medication list given to you today.   Labwork: None today  Testing/Procedures: None today  Follow-Up: 6 months  Any Other Special Instructions Will Be Listed Below (If Applicable).  If you need a refill on your cardiac medications before your next appointment, please call your pharmacy.  

## 2022-01-05 ENCOUNTER — Other Ambulatory Visit: Payer: Self-pay | Admitting: Family Medicine

## 2022-01-13 ENCOUNTER — Other Ambulatory Visit: Payer: Self-pay | Admitting: Family Medicine

## 2022-01-19 ENCOUNTER — Other Ambulatory Visit: Payer: Self-pay | Admitting: Family Medicine

## 2022-01-24 ENCOUNTER — Ambulatory Visit (INDEPENDENT_AMBULATORY_CARE_PROVIDER_SITE_OTHER): Payer: PPO

## 2022-01-24 DIAGNOSIS — I428 Other cardiomyopathies: Secondary | ICD-10-CM

## 2022-01-25 ENCOUNTER — Encounter: Payer: Self-pay | Admitting: Internal Medicine

## 2022-01-25 ENCOUNTER — Ambulatory Visit (INDEPENDENT_AMBULATORY_CARE_PROVIDER_SITE_OTHER): Payer: PPO | Admitting: Internal Medicine

## 2022-01-25 VITALS — BP 110/60 | HR 72 | Ht 66.0 in | Wt 196.0 lb

## 2022-01-25 DIAGNOSIS — Z95810 Presence of automatic (implantable) cardiac defibrillator: Secondary | ICD-10-CM

## 2022-01-25 DIAGNOSIS — I5020 Unspecified systolic (congestive) heart failure: Secondary | ICD-10-CM | POA: Diagnosis not present

## 2022-01-25 LAB — CUP PACEART REMOTE DEVICE CHECK
Battery Remaining Longevity: 86 mo
Battery Voltage: 2.96 V
Brady Statistic AP VP Percent: 16.53 %
Brady Statistic AP VS Percent: 0.44 %
Brady Statistic AS VP Percent: 81.49 %
Brady Statistic AS VS Percent: 1.54 %
Brady Statistic RA Percent Paced: 16.25 %
Brady Statistic RV Percent Paced: 0.21 %
Date Time Interrogation Session: 20230612043724
HighPow Impedance: 73 Ohm
Implantable Lead Implant Date: 20210315
Implantable Lead Implant Date: 20210315
Implantable Lead Implant Date: 20210315
Implantable Lead Location: 753858
Implantable Lead Location: 753859
Implantable Lead Location: 753860
Implantable Lead Model: 4598
Implantable Lead Model: 5076
Implantable Lead Model: 6935
Implantable Pulse Generator Implant Date: 20210315
Lead Channel Impedance Value: 184.154
Lead Channel Impedance Value: 195.429
Lead Channel Impedance Value: 205.2 Ohm
Lead Channel Impedance Value: 224.438
Lead Channel Impedance Value: 241.412
Lead Channel Impedance Value: 342 Ohm
Lead Channel Impedance Value: 361 Ohm
Lead Channel Impedance Value: 399 Ohm
Lead Channel Impedance Value: 418 Ohm
Lead Channel Impedance Value: 456 Ohm
Lead Channel Impedance Value: 456 Ohm
Lead Channel Impedance Value: 513 Ohm
Lead Channel Impedance Value: 551 Ohm
Lead Channel Impedance Value: 608 Ohm
Lead Channel Impedance Value: 665 Ohm
Lead Channel Impedance Value: 779 Ohm
Lead Channel Impedance Value: 817 Ohm
Lead Channel Impedance Value: 836 Ohm
Lead Channel Pacing Threshold Amplitude: 0.5 V
Lead Channel Pacing Threshold Amplitude: 0.875 V
Lead Channel Pacing Threshold Amplitude: 0.875 V
Lead Channel Pacing Threshold Pulse Width: 0.4 ms
Lead Channel Pacing Threshold Pulse Width: 0.4 ms
Lead Channel Pacing Threshold Pulse Width: 0.4 ms
Lead Channel Sensing Intrinsic Amplitude: 15.875 mV
Lead Channel Sensing Intrinsic Amplitude: 15.875 mV
Lead Channel Sensing Intrinsic Amplitude: 3.5 mV
Lead Channel Sensing Intrinsic Amplitude: 3.5 mV
Lead Channel Setting Pacing Amplitude: 1.5 V
Lead Channel Setting Pacing Amplitude: 1.75 V
Lead Channel Setting Pacing Amplitude: 2 V
Lead Channel Setting Pacing Pulse Width: 0.4 ms
Lead Channel Setting Pacing Pulse Width: 0.4 ms
Lead Channel Setting Sensing Sensitivity: 0.3 mV

## 2022-01-25 NOTE — Progress Notes (Signed)
HPI [name] [name] returns today for followup. She is a pleasant 76 yo woman with chronic systolic heart failure and LBBB, s/p Biv ICD insertion. She feels well except for some arthritis. She denies chest pain or sob. No edema. Since her ICD was placed, she has not had syncope or any ICD therapies. She has not required hospitalization. Allergies  Allergen Reactions   Crestor [Rosuvastatin] Other (See Comments)    Muscle aches and elevated CK   Tramadol Nausea Only    States blood sugar elevated also   Penicillins Rash and Other (See Comments)    Family unsure of reaction, but certain mom said she was allergic Did it involve swelling of the face/tongue/throat, SOB, or low BP? No Did it involve sudden or severe rash/hives, skin peeling, or any reaction on the inside of your mouth or nose? Yes Did you need to seek medical attention at a hospital or doctor's office? Yes When did it last happen?  10 + years     If all above answers are "NO", may proceed with cephalosporin use.      Current Outpatient Medications  Medication Sig Dispense Refill   acetaminophen (TYLENOL) 500 MG tablet Take one tablet two times daily, by mouth, for knee  pain 60 tablet 3   alendronate (FOSAMAX) 70 MG tablet Take 1 tablet (70 mg total) by mouth every 7 (seven) days. Take with a full glass of water on an empty stomach. 4 tablet 11   allopurinol (ZYLOPRIM) 100 MG tablet Take by mouth.     aspirin EC 81 MG tablet Take 81 mg by mouth every morning.      blood glucose meter kit and supplies Dispense based on patient and insurance preference. Use to test three times daily (FOR ICD-10 E10.9, E11.9). 1 each 0   carvedilol (COREG) 25 MG tablet TAKE 1 TABLET BY MOUTH TWICE DAILY WITH MEALS. 60 tablet 6   cetirizine (ZYRTEC) 10 MG tablet Take 1 tablet (10 mg total) by mouth daily. 30 tablet 11   cholecalciferol (VITAMIN D3) 25 MCG (1000 UNIT) tablet Take 1,000 Units by mouth daily.     colchicine 0.6 MG tablet Take  1 tablet (0.6 mg total) by mouth 2 (two) times daily. Take with food 60 tablet 5   diclofenac Sodium (VOLTAREN) 1 % GEL Apply 1 application topically 4 (four) times daily as needed (pain).     ENTRESTO 24-26 MG TAKE 1 TABLET BY MOUTH TWICE DAILY 60 tablet 6   ezetimibe (ZETIA) 10 MG tablet TAKE ONE TABLET BY MOUTH ONCE DAILY. 90 tablet 0   fenofibrate (TRICOR) 145 MG tablet TAKE (1) TABLET BY MOUTH ONCE DAILY. 30 tablet 3   furosemide (LASIX) 40 MG tablet TAKE (1) TABLET BY MOUTH EACH MORNING. 30 tablet 3   glipiZIDE (GLUCOTROL XL) 2.5 MG 24 hr tablet TAKE ONE TABLET BY MOUTH ONCE DAILY WITH BREAKFAST. 30 tablet 0   Lancets (ONETOUCH DELICA PLUS [id number]) MISC USE TO CHECK BLOOD SUGAR THREE TIMES DAILY. 100 each 0   LITETOUCH PEN NEEDLES 31G X 8 MM MISC USE TWICE DAILY AS DIRECTED. 100 each 0   LIVALO 2 MG TABS TAKE ONE TABLET BY MOUTH ONCE DAILY AFTER SUPPER. 90 tablet 0   Multiple Vitamins-Minerals (MULTIVITAMIN PO) Take 1 tablet by mouth every morning.      NOVOLOG MIX 70/30 FLEXPEN (70-30) 100 UNIT/ML FlexPen INJECT 12 TO 15 UNITS SUBCUTANEOUSLY TWICE DAILY. 15 mL 0   ONETOUCH ULTRA test  strip USE TO CHECK BLOOD SUGAR 3 TIMES DAILY 200 strip 0   potassium chloride (KLOR-CON) 10 MEQ tablet TAKE ONE TABLET BY MOUTH DAILY. DO NOT TAKE IF NOT TAKING FUROSEMIDE. 30 tablet 3   spironolactone (ALDACTONE) 25 MG tablet Take 0.5 tablets (12.5 mg total) by mouth daily. 45 tablet 3   No current facility-administered medications for this visit.     Past Medical History:  Diagnosis Date   Acute hypoxemic respiratory failure due to COVID-19 ([place]) 06/29/2020   Anxiety    Aortic stenosis    Arthritis    Biventricular ICD (implantable cardioverter-defibrillator) in place    March 2021, Medtronic  -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   Past Surgical History:  Procedure Laterality Date   BIV ICD INSERTION CRT-D N/A 10/28/2019   Procedure: BIV ICD INSERTION CRT-D;  Surgeon: TaEvans LanceMD;  [facility];  Service: Cardiovascular;  Laterality: N/A;   CARDIAC CATHETERIZATION     CESAREAN SECTION  1987   FLEXIBLE BRONCHOSCOPY N/A 01/14/2015   Procedure: FLEXIBLE BRONCHOSCOPY;  Surgeon: StMelrose NakayamaMD;  [facility] Service: Thoracic;  Laterality: N/A;   LEFT AND RIGHT HEART CATHETERIZATION WITH CORONARY ANGIOGRAM N/A 12/05/2011   Procedure: LEFT AND RIGHT HEART CATHETERIZATION WITH CORONARY ANGIOGRAM;  Surgeon: ChBurnell BlanksMD;  [facility];  Service: Cardiovascular;  Laterality: N/A;   LEFT AND RIGHT HEART CATHETERIZATION WITH CORONARY ANGIOGRAM N/A 05/23/2014   Procedure: LEFT AND RIGHT HEART CATHETERIZATION WITH CORONARY ANGIOGRAM;  Surgeon: ChBurnell BlanksMD;  [facility];  Service: Cardiovascular;  Laterality: N/A;   MULTIPLE EXTRACTIONS WITH ALVEOLOPLASTY N/A 02/27/2014   Procedure: Extraction of tooth #'s 2,4,5,6,7,8,9,10,11,12, 22, 23, 24, 25, 26, 28 with alveoloplasty and bilateral mandibular tori reductions;  Surgeon: RoLenn CalDDS;  [facility] Service: Oral Surgery;  Laterality: N/A;   RIGID BRONCHOSCOPY N/A 01/14/2015   Procedure:  RIGID BRONCHOSCOPY with Removal Of Foreign Body ;  Surgeon: StMelrose NakayamaMD;  [facility] Service: Thoracic;  Laterality: N/A;   TEE WITHOUT CARDIOVERSION N/A 03/24/2015   Procedure: TRANSESOPHAGEAL ECHOCARDIOGRAM (TEE);  Surgeon: ChBurnell BlanksMD;  [facility] Service: Open Heart Surgery;  Laterality: N/A;   TRANSCATHETER AORTIC VALVE REPLACEMENT, TRANSFEMORAL Bilateral 03/24/2015   Procedure: TRANSCATHETER AORTIC VALVE REPLACEMENT, TRANSFEMORAL;  Surgeon: ChBurnell BlanksMD;  [facility] Service: Open Heart Surgery;  Laterality: Bilateral;   TUBAL LIGATION  1987   VIDEO BRONCHOSCOPY Bilateral 06/05/2014   Procedure: VIDEO BRONCHOSCOPY WITHOUT FLUORO;  Surgeon: [name], MD;  Location: [facility];  Service: Cardiopulmonary;  Laterality: Bilateral;     Family History  Problem Relation Age of Onset   Heart disease Mother    Heart attack Mother 23   Diabetes Mother    Hypertension Mother    Colon cancer Father 10   Breast cancer Sister    Diabetes Brother    Hypertension Brother    Stroke Brother    Cancer Brother    Diabetes Daughter    Congestive Heart Failure Daughter    Hypertension Son    High Cholesterol Son    Congestive Heart Failure Son      Social History   Socioeconomic History   Marital status: Widowed    Spouse name: Not on file   Number of children: 5   Years of education: 11th   Highest education level: Not on file  Occupational History   Occupation: Retired  Tobacco Use   Smoking status: Never   Smokeless tobacco: Never  Vaping Use   Vaping Use: Never used  Substance and Sexual Activity   Alcohol use: No   Drug use: No   Sexual activity: Never    Birth control/protection: None, Post-menopausal  Other Topics Concern   Not on file  Social History Narrative   Not on file   Social Determinants of Health   Financial Resource Strain: Low Risk  (09/02/2021)   Overall Financial Resource Strain (CARDIA)     Difficulty of Paying Living Expenses: Not hard at all  Food Insecurity: No Food Insecurity (09/02/2021)   Hunger Vital Sign    Worried About Running Out of Food in the Last Year: Never true    [place] in the Last Year: Never true  Transportation Needs: No Transportation Needs (09/02/2021)   PRAPARE - Hydrologist (Medical): No    Lack of Transportation (Non-Medical): No  Physical Activity: Insufficiently Active (09/02/2021)   Exercise Vital Sign    Days of Exercise per Week: 5 days    Minutes of Exercise per Session: 20 min  Stress: No Stress Concern Present (09/02/2021)   [place]    Feeling of Stress : Not at all  Social Connections: Moderately Integrated (09/02/2021)   Social Connection and Isolation Panel [NHANES]    Frequency of Communication with Friends and Family: More than three times a week    Frequency of Social Gatherings with Friends and Family: Three times a week    Attends Religious Services: More than 4 times per year    Active Member of Clubs or Organizations: Yes    Attends Archivist Meetings: Never    Marital Status: Widowed  Intimate Partner Violence: Not At Risk (09/02/2021)   Humiliation, Afraid, Rape, and Kick questionnaire    Fear of Current or Ex-Partner: No    Emotionally Abused: No    Physically Abused: No    Sexually Abused: No     BP 110/60 (BP Location: Left Arm, Patient Position: Sitting, Cuff Size: Normal)   Pulse 72   Ht $R'5\' 6"'JW$  (1.676 m)   Wt 196 lb (88.9 kg)   SpO2 98%   BMI 31.64 kg/m   Physical Exam:  Well appearing NAD HEENT: Unremarkable Neck:  No JVD, no thyromegally Lymphatics:  No adenopathy Back:  No CVA tenderness Lungs:  Clear with no wheezes HEART:  Regular rate rhythm, no murmurs, no rubs, no clicks  Abd:  soft, positive bowel sounds, no organomegally, no rebound, no guarding Ext:  2 plus pulses, no edema, no  cyanosis, no clubbing Skin:  No rashes no nodules Neuro:  CN II through XII intact, motor grossly intact  DEVICE  Normal device function.  See PaceArt for details.   Assess/Plan:  1. Chronic systolic heart failure - she is doing well, with class 2 symptoms. No change in her current medical therapy. 2. Biv ICD - her medtronic biv ICD is working normally. She is adaptive pacing. 3. Obesity - I encouraged the patient to watch her oral intake and lose 10 lbs.  4. HTN - her bp is better.  She is encouraged to avoid salty foods.  Carleene Overlie Dahmir Epperly,MD

## 2022-01-25 NOTE — Patient Instructions (Signed)
Medication Instructions:  Your physician recommends that you continue on your current medications as directed. Please refer to the Current Medication list given to you today.  *If you need a refill on your cardiac medications before your next appointment, please call your pharmacy*   Lab Work: NONE   If you have labs (blood work) drawn today and your tests are completely normal, you will receive your results only by: MyChart Message (if you have MyChart) OR A paper copy in the mail If you have any lab test that is abnormal or we need to change your treatment, we will call you to review the results.   Testing/Procedures: NONE    Follow-Up: At [facility], you and your health needs are our priority.  As part of our continuing mission to provide you with exceptional heart care, we have created designated Provider Care Teams.  These Care Teams include your primary Cardiologist (physician) and Advanced Practice Providers (APPs -  Physician Assistants and Nurse Practitioners) who all work together to provide you with the care you need, when you need it.  We recommend signing up for the patient portal called "MyChart".  Sign up information is provided on this After Visit Summary.  MyChart is used to connect with patients for Virtual Visits (Telemedicine).  Patients are able to view lab/test results, encounter notes, upcoming appointments, etc.  Non-urgent messages can be sent to your provider as well.   To learn more about what you can do with MyChart, go to [email or web address].    Your next appointment:   1 year(s)  The format for your next appointment:   In Person  Provider:   Gregg Taylor, MD    Other Instructions Thank you for choosing [facility]!    Important Information About Sugar       

## 2022-02-02 ENCOUNTER — Other Ambulatory Visit: Payer: Self-pay | Admitting: Family Medicine

## 2022-02-14 NOTE — Progress Notes (Signed)
Remote ICD transmission.   

## 2022-02-18 ENCOUNTER — Other Ambulatory Visit: Payer: Self-pay | Admitting: Family Medicine

## 2022-03-01 ENCOUNTER — Other Ambulatory Visit: Payer: Self-pay | Admitting: Family Medicine

## 2022-03-16 ENCOUNTER — Other Ambulatory Visit: Payer: Self-pay | Admitting: Family Medicine

## 2022-03-28 DIAGNOSIS — M1711 Unilateral primary osteoarthritis, right knee: Secondary | ICD-10-CM | POA: Diagnosis not present

## 2022-03-28 DIAGNOSIS — M25561 Pain in right knee: Secondary | ICD-10-CM | POA: Diagnosis not present

## 2022-03-28 DIAGNOSIS — M170 Bilateral primary osteoarthritis of knee: Secondary | ICD-10-CM | POA: Diagnosis not present

## 2022-03-28 DIAGNOSIS — M1712 Unilateral primary osteoarthritis, left knee: Secondary | ICD-10-CM | POA: Diagnosis not present

## 2022-03-28 DIAGNOSIS — M25562 Pain in left knee: Secondary | ICD-10-CM | POA: Diagnosis not present

## 2022-03-30 ENCOUNTER — Other Ambulatory Visit: Payer: Self-pay | Admitting: Family Medicine

## 2022-04-06 ENCOUNTER — Other Ambulatory Visit: Payer: Self-pay | Admitting: Family Medicine

## 2022-04-07 DIAGNOSIS — M170 Bilateral primary osteoarthritis of knee: Secondary | ICD-10-CM | POA: Diagnosis not present

## 2022-04-08 ENCOUNTER — Ambulatory Visit (INDEPENDENT_AMBULATORY_CARE_PROVIDER_SITE_OTHER): Payer: PPO | Admitting: Family Medicine

## 2022-04-08 ENCOUNTER — Encounter: Payer: Self-pay | Admitting: Family Medicine

## 2022-04-08 VITALS — BP 111/68 | HR 74 | Ht 66.0 in | Wt 193.1 lb

## 2022-04-08 DIAGNOSIS — M159 Polyosteoarthritis, unspecified: Secondary | ICD-10-CM

## 2022-04-08 DIAGNOSIS — I129 Hypertensive chronic kidney disease with stage 1 through stage 4 chronic kidney disease, or unspecified chronic kidney disease: Secondary | ICD-10-CM | POA: Diagnosis not present

## 2022-04-08 DIAGNOSIS — N184 Chronic kidney disease, stage 4 (severe): Secondary | ICD-10-CM | POA: Diagnosis not present

## 2022-04-08 DIAGNOSIS — E782 Mixed hyperlipidemia: Secondary | ICD-10-CM

## 2022-04-08 DIAGNOSIS — E1122 Type 2 diabetes mellitus with diabetic chronic kidney disease: Secondary | ICD-10-CM | POA: Diagnosis not present

## 2022-04-08 DIAGNOSIS — I5042 Chronic combined systolic (congestive) and diastolic (congestive) heart failure: Secondary | ICD-10-CM | POA: Diagnosis not present

## 2022-04-08 DIAGNOSIS — I10 Essential (primary) hypertension: Secondary | ICD-10-CM | POA: Diagnosis not present

## 2022-04-08 NOTE — Patient Instructions (Addendum)
F/U in early December, call if you need me sooner  Pls do glycohB in office today  Pls arrange for pt to return for flu vaccine next week, possibly Wednesday  No labs today, just get labs with Dr Rob Hickman are referred to Podiatry for November appointment  Gel injections will help you a lot with your knees   Be careful not to fall  Start  Tums, two daily for your bones  Thanks for choosing [facility], we consider it a privelige to serve you.

## 2022-04-09 ENCOUNTER — Other Ambulatory Visit: Payer: Self-pay | Admitting: Family Medicine

## 2022-04-09 ENCOUNTER — Other Ambulatory Visit: Payer: Self-pay | Admitting: Orthopedic Surgery

## 2022-04-14 DIAGNOSIS — M170 Bilateral primary osteoarthritis of knee: Secondary | ICD-10-CM | POA: Diagnosis not present

## 2022-04-15 ENCOUNTER — Other Ambulatory Visit: Payer: Self-pay | Admitting: Family Medicine

## 2022-04-15 DIAGNOSIS — I5042 Chronic combined systolic (congestive) and diastolic (congestive) heart failure: Secondary | ICD-10-CM | POA: Diagnosis not present

## 2022-04-15 DIAGNOSIS — D638 Anemia in other chronic diseases classified elsewhere: Secondary | ICD-10-CM | POA: Diagnosis not present

## 2022-04-15 DIAGNOSIS — I129 Hypertensive chronic kidney disease with stage 1 through stage 4 chronic kidney disease, or unspecified chronic kidney disease: Secondary | ICD-10-CM | POA: Diagnosis not present

## 2022-04-15 DIAGNOSIS — N189 Chronic kidney disease, unspecified: Secondary | ICD-10-CM | POA: Diagnosis not present

## 2022-04-15 DIAGNOSIS — E1122 Type 2 diabetes mellitus with diabetic chronic kidney disease: Secondary | ICD-10-CM | POA: Diagnosis not present

## 2022-04-18 ENCOUNTER — Encounter: Payer: Self-pay | Admitting: Family Medicine

## 2022-04-18 NOTE — Assessment & Plan Note (Signed)
Hyperlipidemia:Low fat diet discussed and encouraged.   Lipid Panel  Lab Results  Component Value Date   CHOL 136 12/07/2021   HDL 52 12/07/2021   LDLCALC 65 12/07/2021   TRIG 105 12/07/2021   CHOLHDL 2.6 12/07/2021     Updated lab needed at/ before next visit.

## 2022-04-18 NOTE — Assessment & Plan Note (Signed)
[name] [name] is reminded of the importance of commitment to daily physical activity for 30 minutes or more, as able and the need to limit carbohydrate intake to 30 to 60 grams per meal to help with blood sugar control.   The need to take medication as prescribed, test blood sugar as directed, and to call between visits if there is a concern that blood sugar is uncontrolled is also discussed.   [name] [name] is reminded of the importance of daily foot exam, annual eye examination, and good blood sugar, blood pressure and cholesterol control.     Latest Ref Rng & Units 12/07/2021    3:43 PM 07/19/2021   11:37 AM 07/07/2021   11:45 AM 02/13/2021    2:10 PM 01/04/2021    9:36 AM  Diabetic Labs  HbA1c 4.8 - 5.6 % 7.5   7.0     Micro/Creat Ratio 0 - 29 mg/g creat 12       Chol 100 - 199 mg/dL 136       HDL >39 mg/dL 52       Calc LDL 0 - 99 mg/dL 65       Triglycerides 0 - 149 mg/dL 105       Creatinine 0.57 - 1.00 mg/dL 2.36  1.97   1.72  1.87       04/08/2022    9:40 AM 01/25/2022    2:55 PM 01/03/2022    9:06 AM 12/07/2021    2:46 PM 07/19/2021   10:51 AM 07/07/2021   11:31 AM 05/27/2021   10:40 AM  BP/Weight  Systolic BP 092 330 076 226 333 545 625  Diastolic BP 68 60 64 73 70 63 64  Wt. (Lbs) 193.08 196 193.4 192.4 189.6 187.12 180  BMI 31.16 kg/m2 31.64 kg/m2 31.22 kg/m2 31.05 kg/m2 30.6 kg/m2 30.2 kg/m2 29.95 kg/m2      Latest Ref Rng & Units 04/08/2022    9:40 AM 10/04/2021   12:00 AM  Foot/eye exam completion dates  Eye Exam No Retinopathy  No Retinopathy      Foot Form Completion  Done      This result is from an external source.      Updated lab needed at/ before next visit.

## 2022-04-18 NOTE — Progress Notes (Signed)
[name] [name]     MRN: [id number]      DOB: August 19, 1945   HPI [name] is here for follow up and re-evaluation of chronic medical conditions, medication management and review of any available recent lab and radiology data.  Preventive health is updated, specifically  Cancer screening and Immunization.   Has started a series of gel injections in knees for arthritis and feels this will be helpful The PT denies any adverse reactions to current medications since the last visit.  There are no new concerns.  There are no specific complaints  Denies polyuria, polydipsia, blurred vision , or hypoglycemic episodes.   ROS Denies recent fever or chills. Denies sinus pressure, nasal congestion, ear pain or sore throat. Denies chest congestion, productive cough or wheezing. Denies chest pains, palpitations and leg swelling Denies abdominal pain, nausea, vomiting,diarrhea or constipation.   Denies dysuria, frequency, hesitancy or incontinence. Chronic  joint pain, swelling and limitation in mobility. Denies headaches, seizures, numbness, or tingling. Denies depression, anxiety or insomnia. Denies skin break down or rash.   PE  BP 111/68   Pulse 74   Ht 5\' 6"  (1.676 m)   Wt 193 lb 1.3 oz (87.6 kg)   SpO2 97%   BMI 31.16 kg/m   Patient alert and oriented and in no cardiopulmonary distress.  HEENT: No facial asymmetry, EOMI,     Neck decreased ROM .  Chest: Clear to auscultation bilaterally.  CVS: S1, S2 systolic  murmurs, no S3.Regular rate.  ABD: Soft non tender.   Ext: No edema  MS: decreased  ROM spine, shoulders, hips and knees.  Skin: Intact, no ulcerations or rash noted.  Psych: Good eye contact, normal affect. Memory intact not anxious or depressed appearing.  CNS: CN 2-12 intact, power,  normal throughout.no focal deficits noted.   Assessment & Plan  Benign essential hypertension Controlled, no change in medication DASH diet and commitment to daily physical  activity for a minimum of 30 minutes discussed and encouraged, as a part of hypertension management. The importance of attaining a healthy weight is also discussed.     04/08/2022    9:40 AM 01/25/2022    2:55 PM 01/03/2022    9:06 AM 12/07/2021    2:46 PM 07/19/2021   10:51 AM 07/07/2021   11:31 AM 05/27/2021   10:40 AM  BP/Weight  Systolic BP 580 998 338 250 539 767 341  Diastolic BP 68 60 64 73 70 63 64  Wt. (Lbs) 193.08 196 193.4 192.4 189.6 187.12 180  BMI 31.16 kg/m2 31.64 kg/m2 31.22 kg/m2 31.05 kg/m2 30.6 kg/m2 30.2 kg/m2 29.95 kg/m2       Type 2 diabetes mellitus ([place]) [name] is reminded of the importance of commitment to daily physical activity for 30 minutes or more, as able and the need to limit carbohydrate intake to 30 to 60 grams per meal to help with blood sugar control.   The need to take medication as prescribed, test blood sugar as directed, and to call between visits if there is a concern that blood sugar is uncontrolled is also discussed.   [name] is reminded of the importance of daily foot exam, annual eye examination, and good blood sugar, blood pressure and cholesterol control.     Latest Ref Rng & Units 12/07/2021    3:43 PM 07/19/2021   11:37 AM 07/07/2021   11:45 AM 02/13/2021    2:10 PM 01/04/2021    9:36 AM  Diabetic Labs  HbA1c 4.8 - 5.6 % 7.5   7.0     Micro/Creat Ratio 0 - 29 mg/g creat 12       Chol 100 - 199 mg/dL 136       HDL >39 mg/dL 52       Calc LDL 0 - 99 mg/dL 65       Triglycerides 0 - 149 mg/dL 105       Creatinine 0.57 - 1.00 mg/dL 2.36  1.97   1.72  1.87       04/08/2022    9:40 AM 01/25/2022    2:55 PM 01/03/2022    9:06 AM 12/07/2021    2:46 PM 07/19/2021   10:51 AM 07/07/2021   11:31 AM 05/27/2021   10:40 AM  BP/Weight  Systolic BP 295 188 416 606 301 601 093  Diastolic BP 68 60 64 73 70 63 64  Wt. (Lbs) 193.08 196 193.4 192.4 189.6 187.12 180  BMI 31.16 kg/m2 31.64 kg/m2 31.22 kg/m2 31.05 kg/m2 30.6 kg/m2 30.2  kg/m2 29.95 kg/m2      Latest Ref Rng & Units 04/08/2022    9:40 AM 10/04/2021   12:00 AM  Foot/eye exam completion dates  Eye Exam No Retinopathy  No Retinopathy      Foot Form Completion  Done      This result is from an external source.      Updated lab needed at/ before next visit.   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 [name] is reminded of the importance of commitment to daily physical activity for 30 minutes or more, as able and the need to limit carbohydrate intake to 30 to 60 grams per meal to help with blood sugar control.   The need to take medication as prescribed, test blood sugar as directed, and to call between visits if there is a concern that blood sugar is uncontrolled is also discussed.   [name] is reminded of the importance of daily foot exam, annual eye examination, and good blood sugar, blood pressure and cholesterol control.     Latest Ref Rng & Units 12/07/2021    3:43 PM 07/19/2021   11:37 AM 07/07/2021   11:45 AM 02/13/2021    2:10 PM 01/04/2021    9:36 AM  Diabetic Labs  HbA1c 4.8 - 5.6 % 7.5   7.0     Micro/Creat Ratio 0 - 29 mg/g creat 12       Chol 100 - 199 mg/dL 136       HDL >39 mg/dL 52       Calc LDL 0 - 99 mg/dL 65       Triglycerides 0 - 149 mg/dL 105       Creatinine 0.57 - 1.00 mg/dL 2.36  1.97   1.72  1.87       04/08/2022    9:40 AM 01/25/2022    2:55 PM 01/03/2022    9:06 AM 12/07/2021    2:46 PM 07/19/2021   10:51 AM 07/07/2021   11:31 AM 05/27/2021   10:40 AM  BP/Weight  Systolic BP 235 573 220 254 270 623 762  Diastolic BP 68 60 64 73 70 63 64  Wt. (Lbs) 193.08 196 193.4 192.4 189.6 187.12 180  BMI 31.16 kg/m2 31.64 kg/m2 31.22 kg/m2 31.05 kg/m2 30.6 kg/m2 30.2 kg/m2 29.95 kg/m2      Latest Ref Rng & Units 04/08/2022  9:40 AM 10/04/2021   12:00 AM  Foot/eye exam completion dates  Eye Exam No Retinopathy  No Retinopathy      Foot Form Completion  Done       This result is from an external source.         Hyperlipidemia Hyperlipidemia:Low fat diet discussed and encouraged.   Lipid Panel  Lab Results  Component Value Date   CHOL 136 12/07/2021   HDL 52 12/07/2021   LDLCALC 65 12/07/2021   TRIG 105 12/07/2021   CHOLHDL 2.6 12/07/2021     Updated lab needed at/ before next visit.   Chronic combined systolic and diastolic heart failure (HCC) Currently , no s/s of decompensation  Generalized osteoarthritis Home safety reviewed due to increased fall risk from disabling joint pain and stiffness

## 2022-04-18 NOTE — Assessment & Plan Note (Signed)
Currently , no s/s of decompensation

## 2022-04-18 NOTE — Assessment & Plan Note (Signed)
Controlled, no change in medication DASH diet and commitment to daily physical activity for a minimum of 30 minutes discussed and encouraged, as a part of hypertension management. The importance of attaining a healthy weight is also discussed.     04/08/2022    9:40 AM 01/25/2022    2:55 PM 01/03/2022    9:06 AM 12/07/2021    2:46 PM 07/19/2021   10:51 AM 07/07/2021   11:31 AM 05/27/2021   10:40 AM  BP/Weight  Systolic BP 914 782 956 213 086 578 469  Diastolic BP 68 60 64 73 70 63 64  Wt. (Lbs) 193.08 196 193.4 192.4 189.6 187.12 180  BMI 31.16 kg/m2 31.64 kg/m2 31.22 kg/m2 31.05 kg/m2 30.6 kg/m2 30.2 kg/m2 29.95 kg/m2

## 2022-04-18 NOTE — Assessment & Plan Note (Signed)
Home safety reviewed due to increased fall risk from disabling joint pain and stiffness

## 2022-04-18 NOTE — Assessment & Plan Note (Addendum)
Updated lab needed at/ before next visit. [name] [name] is reminded of the importance of commitment to daily physical activity for 30 minutes or more, as able and the need to limit carbohydrate intake to 30 to 60 grams per meal to help with blood sugar control.   The need to take medication as prescribed, test blood sugar as directed, and to call between visits if there is a concern that blood sugar is uncontrolled is also discussed.   [name] [name] is reminded of the importance of daily foot exam, annual eye examination, and good blood sugar, blood pressure and cholesterol control.     Latest Ref Rng & Units 12/07/2021    3:43 PM 07/19/2021   11:37 AM 07/07/2021   11:45 AM 02/13/2021    2:10 PM 01/04/2021    9:36 AM  Diabetic Labs  HbA1c 4.8 - 5.6 % 7.5   7.0     Micro/Creat Ratio 0 - 29 mg/g creat 12       Chol 100 - 199 mg/dL 136       HDL >39 mg/dL 52       Calc LDL 0 - 99 mg/dL 65       Triglycerides 0 - 149 mg/dL 105       Creatinine 0.57 - 1.00 mg/dL 2.36  1.97   1.72  1.87       04/08/2022    9:40 AM 01/25/2022    2:55 PM 01/03/2022    9:06 AM 12/07/2021    2:46 PM 07/19/2021   10:51 AM 07/07/2021   11:31 AM 05/27/2021   10:40 AM  BP/Weight  Systolic BP 841 324 401 027 253 664 403  Diastolic BP 68 60 64 73 70 63 64  Wt. (Lbs) 193.08 196 193.4 192.4 189.6 187.12 180  BMI 31.16 kg/m2 31.64 kg/m2 31.22 kg/m2 31.05 kg/m2 30.6 kg/m2 30.2 kg/m2 29.95 kg/m2      Latest Ref Rng & Units 04/08/2022    9:40 AM 10/04/2021   12:00 AM  Foot/eye exam completion dates  Eye Exam No Retinopathy  No Retinopathy      Foot Form Completion  Done      This result is from an external source.

## 2022-04-21 DIAGNOSIS — M170 Bilateral primary osteoarthritis of knee: Secondary | ICD-10-CM | POA: Diagnosis not present

## 2022-04-23 DIAGNOSIS — Z6830 Body mass index (BMI) 30.0-30.9, adult: Secondary | ICD-10-CM | POA: Diagnosis not present

## 2022-04-23 DIAGNOSIS — Z5181 Encounter for therapeutic drug level monitoring: Secondary | ICD-10-CM | POA: Diagnosis not present

## 2022-04-23 DIAGNOSIS — N189 Chronic kidney disease, unspecified: Secondary | ICD-10-CM | POA: Diagnosis not present

## 2022-04-23 DIAGNOSIS — I129 Hypertensive chronic kidney disease with stage 1 through stage 4 chronic kidney disease, or unspecified chronic kidney disease: Secondary | ICD-10-CM | POA: Diagnosis not present

## 2022-04-23 DIAGNOSIS — E1122 Type 2 diabetes mellitus with diabetic chronic kidney disease: Secondary | ICD-10-CM | POA: Diagnosis not present

## 2022-04-23 DIAGNOSIS — I5042 Chronic combined systolic (congestive) and diastolic (congestive) heart failure: Secondary | ICD-10-CM | POA: Diagnosis not present

## 2022-04-25 ENCOUNTER — Ambulatory Visit (INDEPENDENT_AMBULATORY_CARE_PROVIDER_SITE_OTHER): Payer: PPO

## 2022-04-25 DIAGNOSIS — I428 Other cardiomyopathies: Secondary | ICD-10-CM | POA: Diagnosis not present

## 2022-04-27 ENCOUNTER — Other Ambulatory Visit: Payer: Self-pay | Admitting: Family Medicine

## 2022-04-27 LAB — CUP PACEART REMOTE DEVICE CHECK
Battery Remaining Longevity: 82 mo
Battery Voltage: 2.95 V
Brady Statistic AP VP Percent: 4.4 %
Brady Statistic AP VS Percent: 0.11 %
Brady Statistic AS VP Percent: 94.04 %
Brady Statistic AS VS Percent: 1.46 %
Brady Statistic RA Percent Paced: 4.5 %
Brady Statistic RV Percent Paced: 0.53 %
Date Time Interrogation Session: 20230911001704
HighPow Impedance: 68 Ohm
Implantable Lead Implant Date: 20210315
Implantable Lead Implant Date: 20210315
Implantable Lead Implant Date: 20210315
Implantable Lead Location: 753858
Implantable Lead Location: 753859
Implantable Lead Location: 753860
Implantable Lead Model: 4598
Implantable Lead Model: 5076
Implantable Lead Model: 6935
Implantable Pulse Generator Implant Date: 20210315
Lead Channel Impedance Value: 1026 Ohm
Lead Channel Impedance Value: 1064 Ohm
Lead Channel Impedance Value: 201.488
Lead Channel Impedance Value: 205.114
Lead Channel Impedance Value: 231.585
Lead Channel Impedance Value: 267.31 Ohm
Lead Channel Impedance Value: 273.729
Lead Channel Impedance Value: 342 Ohm
Lead Channel Impedance Value: 361 Ohm
Lead Channel Impedance Value: 361 Ohm
Lead Channel Impedance Value: 456 Ohm
Lead Channel Impedance Value: 456 Ohm
Lead Channel Impedance Value: 475 Ohm
Lead Channel Impedance Value: 589 Ohm
Lead Channel Impedance Value: 646 Ohm
Lead Channel Impedance Value: 703 Ohm
Lead Channel Impedance Value: 722 Ohm
Lead Channel Impedance Value: 988 Ohm
Lead Channel Pacing Threshold Amplitude: 0.5 V
Lead Channel Pacing Threshold Amplitude: 0.5 V
Lead Channel Pacing Threshold Amplitude: 0.875 V
Lead Channel Pacing Threshold Pulse Width: 0.4 ms
Lead Channel Pacing Threshold Pulse Width: 0.4 ms
Lead Channel Pacing Threshold Pulse Width: 0.4 ms
Lead Channel Sensing Intrinsic Amplitude: 15.25 mV
Lead Channel Sensing Intrinsic Amplitude: 15.25 mV
Lead Channel Sensing Intrinsic Amplitude: 2.375 mV
Lead Channel Sensing Intrinsic Amplitude: 2.375 mV
Lead Channel Setting Pacing Amplitude: 1.5 V
Lead Channel Setting Pacing Amplitude: 1.5 V
Lead Channel Setting Pacing Amplitude: 2 V
Lead Channel Setting Pacing Pulse Width: 0.4 ms
Lead Channel Setting Pacing Pulse Width: 0.4 ms
Lead Channel Setting Sensing Sensitivity: 0.3 mV

## 2022-05-02 ENCOUNTER — Other Ambulatory Visit: Payer: Self-pay | Admitting: Cardiology

## 2022-05-04 ENCOUNTER — Ambulatory Visit (INDEPENDENT_AMBULATORY_CARE_PROVIDER_SITE_OTHER): Payer: PPO

## 2022-05-04 DIAGNOSIS — Z23 Encounter for immunization: Secondary | ICD-10-CM | POA: Diagnosis not present

## 2022-05-09 ENCOUNTER — Other Ambulatory Visit: Payer: Self-pay | Admitting: Family Medicine

## 2022-05-12 NOTE — Progress Notes (Signed)
Remote ICD transmission.   

## 2022-05-16 ENCOUNTER — Encounter: Payer: Self-pay | Admitting: Family Medicine

## 2022-05-16 ENCOUNTER — Other Ambulatory Visit: Payer: Self-pay | Admitting: Family Medicine

## 2022-05-16 ENCOUNTER — Ambulatory Visit (INDEPENDENT_AMBULATORY_CARE_PROVIDER_SITE_OTHER): Payer: PPO | Admitting: Family Medicine

## 2022-05-16 DIAGNOSIS — J302 Other seasonal allergic rhinitis: Secondary | ICD-10-CM | POA: Diagnosis not present

## 2022-05-16 NOTE — Progress Notes (Signed)
Virtual Visit via Telephone Note   This visit type was conducted due to national recommendations for restrictions regarding the COVID-19 Pandemic (e.g. social distancing) in an effort to limit this patient's exposure and mitigate transmission in our community.  Due to her co-morbid illnesses, this patient is at least at moderate risk for complications without adequate follow up.  This format is felt to be most appropriate for this patient at this time.  The patient did not have access to video technology/had technical difficulties with video requiring transitioning to audio format only (telephone).  All issues noted in this document were discussed and addressed.  No physical exam could be performed with this format.  Please refer to the patient's chart for her  consent to telehealth for [facility].  [email or web address] Evaluation Performed:  Follow-up visit  Date:  05/16/2022   ID:  [name] [name], [name] [name] 1946/07/14, MRN [id number]  Patient Location: Home Provider Location: Office/Clinic  Participants: Patient Location of Patient: Home Location of Provider: Telehealth Consent was obtain for visit to be over via telehealth. I verified that I am speaking with the correct person using two identifiers.  PCP:  Fayrene Helper, MD   Chief Complaint:    History of Present Illness:    [name] [name] is a 76 y.o. female with complaints of rhinorrhea, sneezing, head congestion and watery eyes since 05/14/2022. She reports having a mild headache yesterday, for which she took Tylenol to relieve symptoms. She denies fever, sore throat, sinus pain, pressure,  nasal congestion, shortness of breath, and body aches.   The patient does not have symptoms concerning for COVID-19 infection (fever, chills, cough, or new shortness of breath).   Past Medical, Surgical, Social History, Allergies, and Medications have been Reviewed.  Past Medical History:  Diagnosis Date   Acute hypoxemic  respiratory failure due to COVID-19 ([place]) 06/29/2020   Anxiety    Aortic stenosis    Arthritis    Biventricular ICD (implantable cardioverter-defibrillator) in place    March 2021, [employer]  -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   23 mm Edwards Sapien 3 transcatheter heart valve placed via open right transfemoral approach   Symptomatic PVCs    Past Surgical History:  Procedure Laterality Date   BIV ICD INSERTION CRT-D N/A 10/28/2019   Procedure: BIV ICD INSERTION CRT-D;  Surgeon: TaEvans LanceMD;  [facility];  Service: Cardiovascular;  Laterality: N/A;   CARDIAC CATHETERIZATION     CESAREAN SECTION  1987   FLEXIBLE BRONCHOSCOPY N/A 01/14/2015   Procedure: FLEXIBLE BRONCHOSCOPY;  Surgeon: StMelrose NakayamaMD;  [facility] Service: Thoracic;  Laterality: N/A;   LEFT AND RIGHT HEART CATHETERIZATION WITH CORONARY ANGIOGRAM N/A 12/05/2011   Procedure: LEFT AND RIGHT HEART CATHETERIZATION WITH CORONARY ANGIOGRAM;  Surgeon: ChHarrell Gave  Santina Evans, MD;  Location: [place] CATH LAB;  Service: Cardiovascular;  Laterality: N/A;   LEFT AND RIGHT HEART CATHETERIZATION WITH CORONARY ANGIOGRAM N/A 05/23/2014   Procedure: LEFT AND RIGHT HEART CATHETERIZATION WITH CORONARY ANGIOGRAM;  Surgeon: [name], MD;  Location[facility] CATH LAB;  Service: Cardiovascular;  Laterality: N/A;   MULTIPLE EXTRACTIONS WITH  ALVEOLOPLASTY N/A 02/27/2014   Procedure: Extraction of tooth #'s 2,4,5,6,7,8,9,10,11,12, 22, 23, 24, 25, 26, 28 with alveoloplasty and bilateral mandibular tori reductions;  Surgeon: [name], DDS;  Location: [place];  Service: Oral Surgery;  Laterality: N/A;   RIGID BRONCHOSCOPY N/A 01/14/2015   Procedure: RIGID BRONCHOSCOPY with Removal Of Foreign Body ;  Surgeon: [name], MD;  Location: [place];  Service: Thoracic;  Laterality: N/A;   TEE WITHOUT CARDIOVERSION N/A 03/24/2015   Procedure: TRANSESOPHAGEAL ECHOCARDIOGRAM (TEE);  Surgeon: [name], MD;  Location: [place];  Service: Open Heart Surgery;  Laterality: N/A;   TRANSCATHETER AORTIC VALVE REPLACEMENT, TRANSFEMORAL Bilateral 03/24/2015   Procedure: TRANSCATHETER AORTIC VALVE REPLACEMENT, TRANSFEMORAL;  Surgeon: [name], MD;  Location: [place];  Service: Open Heart Surgery;  Laterality: Bilateral;   TUBAL LIGATION  1987   VIDEO BRONCHOSCOPY Bilateral 06/05/2014   Procedure: VIDEO BRONCHOSCOPY WITHOUT FLUORO;  Surgeon: [name], MD;  Location: [place];  Service: Cardiopulmonary;  Laterality: Bilateral;     Current Meds  Medication Sig   acetaminophen (TYLENOL) 500 MG tablet Take one tablet two times daily, by mouth, for knee  pain   alendronate (FOSAMAX) 70 MG tablet Take 1 tablet (70 mg total) by mouth every 7 (seven) days. Take with a full glass of water on an empty stomach.   allopurinol (ZYLOPRIM) 100 MG tablet Take by mouth.   aspirin EC 81 MG tablet Take 81 mg by mouth every morning.    blood glucose meter kit and supplies Dispense based on patient and insurance preference. Use to test three times daily (FOR ICD-10 E10.9, E11.9).   carvedilol (COREG) 25 MG tablet TAKE 1 TABLET BY MOUTH TWICE DAILY WITH MEALS.   cetirizine (ZYRTEC) 10 MG tablet Take 1 tablet (10 mg total) by mouth daily.   cholecalciferol (VITAMIN D3) 25 MCG (1000 UNIT) tablet Take 1,000 Units by mouth daily.    colchicine 0.6 MG tablet Take 1 tablet (0.6 mg total) by mouth 2 (two) times daily. Take with food   diclofenac Sodium (VOLTAREN) 1 % GEL Apply 1 application topically 4 (four) times daily as needed (pain).   ENTRESTO 24-26 MG TAKE 1 TABLET BY MOUTH TWICE DAILY   ezetimibe (ZETIA) 10 MG tablet TAKE ONE TABLET BY MOUTH ONCE DAILY.   fenofibrate (TRICOR) 145 MG tablet TAKE (1) TABLET BY MOUTH ONCE DAILY.   furosemide (LASIX) 40 MG tablet TAKE (1) TABLET BY MOUTH EACH MORNING.   glipiZIDE (GLUCOTROL XL) 2.5 MG 24 hr tablet TAKE ONE TABLET BY MOUTH ONCE DAILY WITH BREAKFAST.   ibuprofen (ADVIL) 800 MG tablet TAKE 1 TABLET BY MOUTH EVERY 8 HOURS AS NEEDED   Lancets (ONETOUCH DELICA PLUS [id number]) MISC USE AS DIRECTED TO MONITOR BLOOD SUGAR (3) TIMES DAILY.   LITETOUCH PEN NEEDLES 31G X 8 MM MISC USE TWICE DAILY AS DIRECTED.   LIVALO 2 MG TABS TAKE ONE TABLET BY MOUTH ONCE DAILY AFTER SUPPER.   Multiple Vitamins-Minerals (MULTIVITAMIN PO) Take 1 tablet by mouth every morning.    NOVOLOG MIX 70/30 FLEXPEN (70-30) 100 UNIT/ML FlexPen INJECT 12 TO 15 UNITS SUBCUTANEOUSLY  TWICE DAILY.   ONETOUCH ULTRA test strip USE TO CHECK BLOOD SUGAR 3 TIMES DAILY   potassium chloride (KLOR-CON) 10 MEQ tablet TAKE ONE TABLET BY MOUTH DAILY. DO NOT TAKE IF NOT TAKING FUROSEMIDE.   spironolactone (ALDACTONE) 25 MG tablet Take 0.5 tablets (12.5 mg total) by mouth daily.     Allergies:   Crestor [rosuvastatin], Tramadol, and Penicillins   ROS:   Please see the history of present illness.     All other systems reviewed and are negative.   Labs/Other Tests and Data Reviewed:    Recent Labs: 12/07/2021: ALT 17; BUN 51; Creatinine, Ser 2.36; Hemoglobin 11.7; Platelets 227; Potassium 4.9; Sodium 145; TSH 1.510   Recent Lipid Panel Lab Results  Component Value Date/Time   CHOL 136 12/07/2021 03:43 PM   TRIG 105 12/07/2021 03:43 PM   HDL 52 12/07/2021 03:43 PM   CHOLHDL 2.6 12/07/2021 03:43 PM   CHOLHDL 2.9  08/20/2019 10:01 AM   LDLCALC 65 12/07/2021 03:43 PM   LDLCALC 94 08/20/2019 10:01 AM    Wt Readings from Last 3 Encounters:  04/08/22 193 lb 1.3 oz (87.6 kg)  01/25/22 196 lb (88.9 kg)  01/03/22 193 lb 6.4 oz (87.7 kg)     Objective:    Vital Signs:  There were no vitals taken for this visit.     ASSESSMENT & PLAN:   Seasonal allergies She has Zyrtec 10 mg tablet order with 11 refills Informed patient to refill Zyrtec 10 mg and take for her allergy symptoms Encouraged to continue taking Tylenol as needed for for headaches or body aches Informed to call if her symptoms worsen in the next 5 business days Patient verbalized understanding  Time:   Today, I have spent 8 minutes reviewing the chart, including problem list, medications, and with the patient with telehealth technology discussing the above problems.   Medication Adjustments/Labs and Tests Ordered: Current medicines are reviewed at length with the patient today.  Concerns regarding medicines are outlined above.   Tests Ordered: No orders of the defined types were placed in this encounter.   Medication Changes: No orders of the defined types were placed in this encounter.    Note: This dictation was prepared with Dragon dictation along with smaller phrase technology. Similar sounding words can be transcribed inadequately or may not be corrected upon review. Any transcriptional errors that result from this process are unintentional.      Disposition:  Follow up  Signed, Alvira Monday, FNP  05/16/2022 3:32 PM     [facility]

## 2022-05-26 ENCOUNTER — Other Ambulatory Visit: Payer: Self-pay | Admitting: Family Medicine

## 2022-05-31 ENCOUNTER — Other Ambulatory Visit: Payer: Self-pay | Admitting: Family Medicine

## 2022-06-03 ENCOUNTER — Telehealth: Payer: Self-pay | Admitting: Orthopedic Surgery

## 2022-06-03 NOTE — Telephone Encounter (Signed)
Patient called to ask if there is anything topical she can apply to her knees for her arthritis. Patient mentioned, as per Epic chart notes that she has been to [facility]; states her family suggested she try there 'just to get the gel injections.'  Patient said she did get the 3 injections, 'and they didn't help much'.  Said she is not leaving Dr Aline Brochure. Please advise.  If a prescription, it is [place].

## 2022-06-06 NOTE — Telephone Encounter (Signed)
Any of the OTC topicals are fine, I called her to advise she said she has voltaren gel and will try that, I told her that is fine  To Dr Bill Salinas

## 2022-06-13 ENCOUNTER — Other Ambulatory Visit: Payer: Self-pay | Admitting: Family Medicine

## 2022-06-20 ENCOUNTER — Other Ambulatory Visit: Payer: Self-pay | Admitting: Family Medicine

## 2022-06-29 ENCOUNTER — Other Ambulatory Visit: Payer: Self-pay | Admitting: Family Medicine

## 2022-07-08 ENCOUNTER — Other Ambulatory Visit: Payer: Self-pay | Admitting: Family Medicine

## 2022-07-11 ENCOUNTER — Other Ambulatory Visit: Payer: Self-pay | Admitting: Family Medicine

## 2022-07-11 ENCOUNTER — Telehealth: Payer: Self-pay | Admitting: Family Medicine

## 2022-07-11 ENCOUNTER — Other Ambulatory Visit: Payer: Self-pay

## 2022-07-11 MED ORDER — EZETIMIBE 10 MG PO TABS: 10.0000 mg | ORAL_TABLET | Freq: Every day | ORAL | 0 refills | Status: DC

## 2022-07-11 MED ORDER — NOVOLOG MIX 70/30 FLEXPEN (70-30) 100 UNIT/ML ~~LOC~~ SUPN: PEN_INJECTOR | SUBCUTANEOUS | 0 refills | Status: DC

## 2022-07-11 MED ORDER — FENOFIBRATE 145 MG PO TABS: ORAL_TABLET | ORAL | 0 refills | Status: DC

## 2022-07-11 MED ORDER — ONETOUCH DELICA PLUS LANCET33G MISC: 0 refills | Status: DC

## 2022-07-11 NOTE — Telephone Encounter (Signed)
Pt states she needs insulin urgently has been out since last week. Also needs refills on other meds. Can you please refill?  NOVOLOG MIX 70/30 FLEXPEN (70-30) 100 UNIT/ML FlexPen   Lancets (ONETOUCH DELICA PLUS [id number]) MISC   ezetimibe (ZETIA) 10 MG tablet   colchicine 0.6 MG tablet   fenofibrate (TRICOR) 145 MG tablet   [employer]

## 2022-07-11 NOTE — Telephone Encounter (Signed)
Refilled all but colchicine which was prescribed by another provider. Called pt not able to get in touch with her on either number

## 2022-07-11 NOTE — Telephone Encounter (Signed)
Patient called left voicemail 11/24 need refill on insulin.

## 2022-07-12 NOTE — Telephone Encounter (Signed)
Spoke with pt advised of refills she picked them up yesterday

## 2022-07-19 ENCOUNTER — Other Ambulatory Visit: Payer: Self-pay | Admitting: Family Medicine

## 2022-07-20 ENCOUNTER — Other Ambulatory Visit: Payer: Self-pay | Admitting: Orthopedic Surgery

## 2022-07-20 ENCOUNTER — Other Ambulatory Visit: Payer: Self-pay | Admitting: Family Medicine

## 2022-07-20 ENCOUNTER — Ambulatory Visit: Payer: PPO | Admitting: Podiatry

## 2022-07-20 DIAGNOSIS — M1A0610 Idiopathic chronic gout, right knee, without tophus (tophi): Secondary | ICD-10-CM

## 2022-07-21 ENCOUNTER — Encounter: Payer: Self-pay | Admitting: Family Medicine

## 2022-07-21 ENCOUNTER — Ambulatory Visit (INDEPENDENT_AMBULATORY_CARE_PROVIDER_SITE_OTHER): Payer: PPO | Admitting: Family Medicine

## 2022-07-21 VITALS — BP 100/62 | HR 80 | Ht 66.0 in | Wt 189.1 lb

## 2022-07-21 DIAGNOSIS — E1122 Type 2 diabetes mellitus with diabetic chronic kidney disease: Secondary | ICD-10-CM

## 2022-07-21 DIAGNOSIS — M1712 Unilateral primary osteoarthritis, left knee: Secondary | ICD-10-CM | POA: Diagnosis not present

## 2022-07-21 DIAGNOSIS — I5042 Chronic combined systolic (congestive) and diastolic (congestive) heart failure: Secondary | ICD-10-CM

## 2022-07-21 DIAGNOSIS — E782 Mixed hyperlipidemia: Secondary | ICD-10-CM | POA: Diagnosis not present

## 2022-07-21 DIAGNOSIS — I129 Hypertensive chronic kidney disease with stage 1 through stage 4 chronic kidney disease, or unspecified chronic kidney disease: Secondary | ICD-10-CM | POA: Diagnosis not present

## 2022-07-21 DIAGNOSIS — E669 Obesity, unspecified: Secondary | ICD-10-CM | POA: Diagnosis not present

## 2022-07-21 DIAGNOSIS — I10 Essential (primary) hypertension: Secondary | ICD-10-CM | POA: Diagnosis not present

## 2022-07-21 DIAGNOSIS — N184 Chronic kidney disease, stage 4 (severe): Secondary | ICD-10-CM | POA: Diagnosis not present

## 2022-07-21 NOTE — Patient Instructions (Addendum)
Annual exam end April, call if you need me sooner  You are referred to  Dr Dortha Kern today, lipid,hepatic panel and HBA1C  Please get the 2 shingrix vaccines  RSV and covid vaccines are recommended  Mammogram is recommended, let me know  Thanks for choosing [facility], we consider it a privelige to serve you.

## 2022-07-23 LAB — LIPID PANEL
Chol/HDL Ratio: 3.7 ratio (ref 0.0–4.4)
Cholesterol, Total: 167 mg/dL (ref 100–199)
HDL: 45 mg/dL (ref >39)
LDL Chol Calc (NIH): 100 mg/dL — ABNORMAL HIGH (ref 0–99)
Triglycerides: 121 mg/dL (ref 0–149)
VLDL Cholesterol Cal: 22 mg/dL (ref 5–40)

## 2022-07-23 LAB — HEPATIC FUNCTION PANEL
ALT: 21 IU/L (ref 0–32)
AST: 28 IU/L (ref 0–40)
Albumin: 4.5 g/dL (ref 3.8–4.8)
Alkaline Phosphatase: 33 IU/L — ABNORMAL LOW (ref 44–121)
Bilirubin Total: 0.4 mg/dL (ref 0.0–1.2)
Bilirubin, Direct: 0.19 mg/dL (ref 0.00–0.40)
Total Protein: 6.8 g/dL (ref 6.0–8.5)

## 2022-07-23 LAB — HEMOGLOBIN A1C
Est. average glucose Bld gHb Est-mCnc: 163 mg/dL
Hgb A1c MFr Bld: 7.3 % — ABNORMAL HIGH (ref 4.8–5.6)

## 2022-07-24 ENCOUNTER — Encounter: Payer: Self-pay | Admitting: Family Medicine

## 2022-07-24 NOTE — Assessment & Plan Note (Signed)
Uncontrolled pain refer Ortho

## 2022-07-24 NOTE — Assessment & Plan Note (Signed)
  Patient re-educated about  the importance of commitment to a  minimum of 150 minutes of exercise per week as able.  The importance of healthy food choices with portion control discussed, as well as eating regularly and within a 12 hour window most days. The need to choose "clean , green" food 50 to 75% of the time is discussed, as well as to make water the primary drink and set a goal of 64 ounces water daily.       07/21/2022   10:22 AM 04/08/2022    9:40 AM 01/25/2022    2:55 PM  Weight /BMI  Weight 189 lb 1.3 oz 193 lb 1.3 oz 196 lb  Height 5\' 6"  (1.676 m) 5\' 6"  (1.676 m) 5\' 6"  (1.676 m)  BMI 30.52 kg/m2 31.16 kg/m2 31.64 kg/m2

## 2022-07-24 NOTE — Assessment & Plan Note (Signed)
Hyperlipidemia:Low fat diet discussed and encouraged.   Lipid Panel  Lab Results  Component Value Date   CHOL 167 07/21/2022   HDL 45 07/21/2022   LDLCALC 100 (H) 07/21/2022   TRIG 121 07/21/2022   CHOLHDL 3.7 07/21/2022     Needs to reduce fat intake , no med change

## 2022-07-24 NOTE — Assessment & Plan Note (Addendum)
[name] [name] is reminded of the importance of commitment to daily physical activity for 30 minutes or more, as able and the need to limit carbohydrate intake to 30 to 60 grams per meal to help with blood sugar control.   The need to take medication as prescribed, test blood sugar as directed, and to call between visits if there is a concern that blood sugar is uncontrolled is also discussed.   [name] [name] is reminded of the importance of daily foot exam, annual eye examination, and good blood sugar, blood pressure and cholesterol control.     Latest Ref Rng & Units 07/21/2022   11:27 AM 12/07/2021    3:43 PM 07/19/2021   11:37 AM 07/07/2021   11:45 AM 02/13/2021    2:10 PM  Diabetic Labs  HbA1c 4.8 - 5.6 % 7.3  7.5   7.0    Micro/Creat Ratio 0 - 29 mg/g creat  12      Chol 100 - 199 mg/dL 167  136      HDL >39 mg/dL 45  52      Calc LDL 0 - 99 mg/dL 100  65      Triglycerides 0 - 149 mg/dL 121  105      Creatinine 0.57 - 1.00 mg/dL  2.36  1.97   1.72       07/21/2022   10:22 AM 04/08/2022    9:40 AM 01/25/2022    2:55 PM 01/03/2022    9:06 AM 12/07/2021    2:46 PM 07/19/2021   10:51 AM 07/07/2021   11:31 AM  BP/Weight  Systolic BP 195 093 267 124 580 998 338  Diastolic BP 62 68 60 64 73 70 63  Wt. (Lbs) 189.08 193.08 196 193.4 192.4 189.6 187.12  BMI 30.52 kg/m2 31.16 kg/m2 31.64 kg/m2 31.22 kg/m2 31.05 kg/m2 30.6 kg/m2 30.2 kg/m2      Latest Ref Rng & Units 04/08/2022    9:40 AM 10/04/2021   12:00 AM  Foot/eye exam completion dates  Eye Exam No Retinopathy  No Retinopathy      Foot Form Completion  Done      This result is from an external source.    Adequately controlled

## 2022-07-24 NOTE — Progress Notes (Signed)
[name] [name]     MRN: [id number]      DOB: May 10, 1946   HPI [name] [name] is here for follow up and re-evaluation of chronic medical conditions, medication management and review of any available recent lab and radiology data.  Preventive health is updated, specifically  Cancer screening and Immunization.   Questions or concerns regarding consultations or procedures which the PT has had in the interim are  addressed. The PT denies any adverse reactions to current medications since the last visit.  C/o increaed arthritic pain in knees left worse than right , has benefited from intr articular injection, denies any falls   ROS Denies recent fever or chills. Denies sinus pressure, nasal congestion, ear pain or sore throat. Denies chest congestion, productive cough or wheezing. Denies chest pains, palpitations and leg swelling Denies abdominal pain, nausea, vomiting,diarrhea or constipation.   Denies dysuria, frequency, hesitancy or incontinence. Denies headaches, seizures, numbness, or tingling. Denies depression, anxiety or insomnia. Denies skin break down or rash.   PE  BP 100/62 (BP Location: Right Arm, Patient Position: Sitting, Cuff Size: Large)   Pulse 80   Ht 5\' 6"  (1.676 m)   Wt 189 lb 1.3 oz (85.8 kg)   SpO2 95%   BMI 30.52 kg/m   Patient alert and oriented and in no cardiopulmonary distress.  HEENT: No facial asymmetry, EOMI,     Neck supple .  Chest: Clear to auscultation bilaterally.  CVS: S1, S2 sytolic  murmurs, no S3.Regular rate.  ABD: Soft non tender.   Ext: No edema  MS: decreased ROM spine, shoulders, hips and markedly reduced in knees.left greater than right  Skin: Intact, no ulcerations or rash noted.  Psych: Good eye contact, normal affect. Memory intact not anxious or depressed appearing.  CNS: CN 2-12 intact, power,  normal throughout.no focal deficits noted.   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   The need to take medication as prescribed, test blood sugar as directed, and to call between visits if there is a concern that blood sugar is uncontrolled is also discussed.   [name] [name] is reminded of the importance of daily foot exam, annual eye examination, and good blood sugar, blood pressure and cholesterol control.     Latest Ref Rng & Units 07/21/2022   11:27 AM 12/07/2021    3:43 PM 07/19/2021   11:37 AM 07/07/2021   11:45 AM 02/13/2021    2:10 PM  Diabetic Labs  HbA1c 4.8 - 5.6 % 7.3  7.5   7.0    Micro/Creat Ratio 0 - 29 mg/g creat  12      Chol 100 - 199 mg/dL 167  136      HDL >39 mg/dL 45  52      Calc LDL 0 - 99 mg/dL 100  65      Triglycerides 0 - 149 mg/dL 121  105      Creatinine 0.57 - 1.00 mg/dL  2.36  1.97   1.72       07/21/2022   10:22 AM 04/08/2022  9:40 AM 01/25/2022    2:55 PM 01/03/2022    9:06 AM 12/07/2021    2:46 PM 07/19/2021   10:51 AM 07/07/2021   11:31 AM  BP/Weight  Systolic BP 383 291 916 606 004 599 774  Diastolic BP 62 68 60 64 73 70 63  Wt. (Lbs) 189.08 193.08 196 193.4 192.4 189.6 187.12  BMI 30.52 kg/m2 31.16 kg/m2 31.64 kg/m2 31.22 kg/m2 31.05 kg/m2 30.6 kg/m2 30.2 kg/m2      Latest Ref Rng & Units 04/08/2022    9:40 AM 10/04/2021   12:00 AM  Foot/eye exam completion dates  Eye Exam No Retinopathy  No Retinopathy      Foot Form Completion  Done      This result is from an external source.    Adequately controlled    Hyperlipidemia Hyperlipidemia:Low fat diet discussed and encouraged.   Lipid Panel  Lab Results  Component Value Date   CHOL 167  07/21/2022   HDL 45 07/21/2022   LDLCALC 100 (H) 07/21/2022   TRIG 121 07/21/2022   CHOLHDL 3.7 07/21/2022     Needs to reduce fat intake , no med change  Unilateral primary osteoarthritis, left knee Uncontrolled pain refer Ortho  Obesity (BMI 30.0-34.9)  Patient re-educated about  the importance of commitment to a  minimum of 150 minutes of exercise per week as able.  The importance of healthy food choices with portion control discussed, as well as eating regularly and within a 12 hour window most days. The need to choose "clean , green" food 50 to 75% of the time is discussed, as well as to make water the primary drink and set a goal of 64 ounces water daily.       07/21/2022   10:22 AM 04/08/2022    9:40 AM 01/25/2022    2:55 PM  Weight /BMI  Weight 189 lb 1.3 oz 193 lb 1.3 oz 196 lb  Height 5\' 6"  (1.676 m) 5\' 6"  (1.676 m) 5\' 6"  (1.676 m)  BMI 30.52 kg/m2 31.16 kg/m2 31.64 kg/m2

## 2022-07-24 NOTE — Assessment & Plan Note (Signed)
Controlled, no change in medication  

## 2022-07-24 NOTE — Assessment & Plan Note (Signed)
Stable , no decompensation currently 

## 2022-07-25 ENCOUNTER — Ambulatory Visit (INDEPENDENT_AMBULATORY_CARE_PROVIDER_SITE_OTHER): Payer: PPO

## 2022-07-25 DIAGNOSIS — I428 Other cardiomyopathies: Secondary | ICD-10-CM | POA: Diagnosis not present

## 2022-07-25 DIAGNOSIS — I5042 Chronic combined systolic (congestive) and diastolic (congestive) heart failure: Secondary | ICD-10-CM

## 2022-07-26 LAB — CUP PACEART REMOTE DEVICE CHECK
Battery Remaining Longevity: 78 mo
Battery Voltage: 2.94 V
Brady Statistic AP VP Percent: 2.6 %
Brady Statistic AP VS Percent: 0.08 %
Brady Statistic AS VP Percent: 95.91 %
Brady Statistic AS VS Percent: 1.42 %
Brady Statistic RA Percent Paced: 2.67 %
Brady Statistic RV Percent Paced: 0.76 %
Date Time Interrogation Session: 20231211033323
HighPow Impedance: 62 Ohm
Implantable Lead Connection Status: 753985
Implantable Lead Connection Status: 753985
Implantable Lead Connection Status: 753985
Implantable Lead Implant Date: 20210315
Implantable Lead Implant Date: 20210315
Implantable Lead Implant Date: 20210315
Implantable Lead Location: 753858
Implantable Lead Location: 753859
Implantable Lead Location: 753860
Implantable Lead Model: 4598
Implantable Lead Model: 5076
Implantable Lead Model: 6935
Implantable Pulse Generator Implant Date: 20210315
Lead Channel Impedance Value: 1007 Ohm
Lead Channel Impedance Value: 188.1 Ohm
Lead Channel Impedance Value: 198.837
Lead Channel Impedance Value: 218.88 Ohm
Lead Channel Impedance Value: 247.704
Lead Channel Impedance Value: 266.667
Lead Channel Impedance Value: 342 Ohm
Lead Channel Impedance Value: 342 Ohm
Lead Channel Impedance Value: 342 Ohm
Lead Channel Impedance Value: 399 Ohm
Lead Channel Impedance Value: 418 Ohm
Lead Channel Impedance Value: 475 Ohm
Lead Channel Impedance Value: 551 Ohm
Lead Channel Impedance Value: 608 Ohm
Lead Channel Impedance Value: 665 Ohm
Lead Channel Impedance Value: 722 Ohm
Lead Channel Impedance Value: 893 Ohm
Lead Channel Impedance Value: 931 Ohm
Lead Channel Pacing Threshold Amplitude: 0.5 V
Lead Channel Pacing Threshold Amplitude: 0.625 V
Lead Channel Pacing Threshold Amplitude: 1 V
Lead Channel Pacing Threshold Pulse Width: 0.4 ms
Lead Channel Pacing Threshold Pulse Width: 0.4 ms
Lead Channel Pacing Threshold Pulse Width: 0.4 ms
Lead Channel Sensing Intrinsic Amplitude: 18.5 mV
Lead Channel Sensing Intrinsic Amplitude: 18.5 mV
Lead Channel Sensing Intrinsic Amplitude: 2.125 mV
Lead Channel Sensing Intrinsic Amplitude: 2.125 mV
Lead Channel Setting Pacing Amplitude: 1.5 V
Lead Channel Setting Pacing Amplitude: 1.5 V
Lead Channel Setting Pacing Amplitude: 2 V
Lead Channel Setting Pacing Pulse Width: 0.4 ms
Lead Channel Setting Pacing Pulse Width: 0.4 ms
Lead Channel Setting Sensing Sensitivity: 0.3 mV
Zone Setting Status: 755011
Zone Setting Status: 755011

## 2022-07-27 ENCOUNTER — Ambulatory Visit: Payer: PPO | Admitting: Orthopedic Surgery

## 2022-07-27 ENCOUNTER — Ambulatory Visit (INDEPENDENT_AMBULATORY_CARE_PROVIDER_SITE_OTHER): Payer: PPO

## 2022-07-27 ENCOUNTER — Encounter: Payer: Self-pay | Admitting: Orthopedic Surgery

## 2022-07-27 VITALS — BP 110/69 | HR 71 | Ht 66.0 in | Wt 187.0 lb

## 2022-07-27 DIAGNOSIS — M25562 Pain in left knee: Secondary | ICD-10-CM

## 2022-07-27 DIAGNOSIS — G8929 Other chronic pain: Secondary | ICD-10-CM

## 2022-07-27 DIAGNOSIS — M1712 Unilateral primary osteoarthritis, left knee: Secondary | ICD-10-CM

## 2022-07-27 MED ORDER — METHYLPREDNISOLONE ACETATE 40 MG/ML IJ SUSP: 40.0000 mg | Freq: Once | INTRAMUSCULAR | Status: DC

## 2022-07-27 MED ORDER — METHYLPREDNISOLONE ACETATE 80 MG/ML IJ SUSP: 80.0000 mg | Freq: Once | INTRAMUSCULAR | Status: DC

## 2022-07-27 MED ORDER — METHYLPREDNISOLONE ACETATE 40 MG/ML IJ SUSP
40.0000 mg | Freq: Once | INTRAMUSCULAR | Status: AC
  Administered 2022-07-27: 40 mg via INTRA_ARTICULAR

## 2022-07-27 NOTE — Progress Notes (Signed)
Chief Complaint  Patient presents with   Knee Pain    Left, has had gel injections but didn't work, takes tylenol and gets some relief    Encounter Diagnoses  Name Primary?   Chronic pain of left knee Yes   Primary osteoarthritis of left knee     76 year old female with chronic left knee pain with osteoarthritis  She has severe aortic stenosis had a left bundle blanch block chronic stage IV CKD with type 2 diabetes cardiomyopathy hypertension she has had an aortic valve replacement.  Discussed this with [facility].  She had HA injections  She says cardiology will not let her have surgery.  I am in agreement with that as this could not be done at [facility] if she could have the surgery  She agreed to injection  Procedure note left knee injection   verbal consent was obtained to inject left knee joint  Timeout was completed to confirm the site of injection  The medications used were depomedrol 80 mg and 1% lidocaine 3 cc Anesthesia was provided by ethyl chloride and the skin was prepped with alcohol.  After cleaning the skin with alcohol a 20-gauge needle was used to inject the left knee joint. There were no complications. A sterile bandage was applied.   Return as needed

## 2022-07-27 NOTE — Addendum Note (Signed)
Addended by: Moreen Fowler R on: 07/27/2022 09:52 AM   Modules accepted: Orders

## 2022-08-01 ENCOUNTER — Other Ambulatory Visit: Payer: Self-pay | Admitting: Family Medicine

## 2022-08-11 ENCOUNTER — Other Ambulatory Visit: Payer: Self-pay | Admitting: Family Medicine

## 2022-08-15 ENCOUNTER — Other Ambulatory Visit: Payer: Self-pay | Admitting: Family Medicine

## 2022-08-17 DIAGNOSIS — I129 Hypertensive chronic kidney disease with stage 1 through stage 4 chronic kidney disease, or unspecified chronic kidney disease: Secondary | ICD-10-CM | POA: Diagnosis not present

## 2022-08-17 DIAGNOSIS — Z5181 Encounter for therapeutic drug level monitoring: Secondary | ICD-10-CM | POA: Diagnosis not present

## 2022-08-17 DIAGNOSIS — N189 Chronic kidney disease, unspecified: Secondary | ICD-10-CM | POA: Diagnosis not present

## 2022-08-17 DIAGNOSIS — E1122 Type 2 diabetes mellitus with diabetic chronic kidney disease: Secondary | ICD-10-CM | POA: Diagnosis not present

## 2022-08-17 DIAGNOSIS — I5042 Chronic combined systolic (congestive) and diastolic (congestive) heart failure: Secondary | ICD-10-CM | POA: Diagnosis not present

## 2022-08-18 ENCOUNTER — Other Ambulatory Visit: Payer: Self-pay | Admitting: Family Medicine

## 2022-08-22 DIAGNOSIS — I5042 Chronic combined systolic (congestive) and diastolic (congestive) heart failure: Secondary | ICD-10-CM | POA: Diagnosis not present

## 2022-08-22 DIAGNOSIS — Z5181 Encounter for therapeutic drug level monitoring: Secondary | ICD-10-CM | POA: Diagnosis not present

## 2022-08-22 DIAGNOSIS — N189 Chronic kidney disease, unspecified: Secondary | ICD-10-CM | POA: Diagnosis not present

## 2022-08-22 DIAGNOSIS — I129 Hypertensive chronic kidney disease with stage 1 through stage 4 chronic kidney disease, or unspecified chronic kidney disease: Secondary | ICD-10-CM | POA: Diagnosis not present

## 2022-08-22 DIAGNOSIS — E1122 Type 2 diabetes mellitus with diabetic chronic kidney disease: Secondary | ICD-10-CM | POA: Diagnosis not present

## 2022-08-22 DIAGNOSIS — Z6830 Body mass index (BMI) 30.0-30.9, adult: Secondary | ICD-10-CM | POA: Diagnosis not present

## 2022-08-31 ENCOUNTER — Other Ambulatory Visit: Payer: Self-pay | Admitting: Family Medicine

## 2022-09-01 NOTE — Progress Notes (Signed)
Remote ICD transmission.   

## 2022-09-05 ENCOUNTER — Telehealth: Payer: Self-pay | Admitting: Family Medicine

## 2022-09-05 ENCOUNTER — Other Ambulatory Visit: Payer: Self-pay | Admitting: Family Medicine

## 2022-09-05 ENCOUNTER — Other Ambulatory Visit: Payer: Self-pay

## 2022-09-05 DIAGNOSIS — E1122 Type 2 diabetes mellitus with diabetic chronic kidney disease: Secondary | ICD-10-CM

## 2022-09-05 MED ORDER — BLOOD GLUCOSE METER KIT: PACK | 0 refills | Status: AC

## 2022-09-05 MED ORDER — ONETOUCH DELICA PLUS LANCET33G MISC: 0 refills | Status: DC

## 2022-09-05 MED ORDER — NOVOLOG MIX 70/30 FLEXPEN (70-30) 100 UNIT/ML ~~LOC~~ SUPN: PEN_INJECTOR | SUBCUTANEOUS | 0 refills | Status: DC

## 2022-09-05 NOTE — Telephone Encounter (Signed)
Refills sent

## 2022-09-05 NOTE — Telephone Encounter (Signed)
Patient called need refill also need a new kit.  insulin aspart protamine - aspart (NOVOLOG MIX 70/30 FLEXPEN) (70-30) 100 UNIT/ML FlexPe   Lancets (ONETOUCH DELICA PLUS [id number]) MISC   Pharmacy: [facility]

## 2022-09-09 ENCOUNTER — Ambulatory Visit (INDEPENDENT_AMBULATORY_CARE_PROVIDER_SITE_OTHER): Payer: PPO

## 2022-09-09 ENCOUNTER — Other Ambulatory Visit: Payer: Self-pay | Admitting: Family Medicine

## 2022-09-09 VITALS — BP 131/74 | HR 81 | Ht 66.0 in | Wt 188.0 lb

## 2022-09-09 DIAGNOSIS — Z0000 Encounter for general adult medical examination without abnormal findings: Secondary | ICD-10-CM | POA: Diagnosis not present

## 2022-09-09 NOTE — Patient Instructions (Signed)
  [name] [name] , Thank you for taking time to come for your Medicare Wellness Visit. I appreciate your ongoing commitment to your health goals. Please review the following plan we discussed and let me know if I can assist you in the future.   These are the goals we discussed:  Goals      Exercise 3x per week (30 min per time)     Patient would like to increase her exercise to 4 days a weeks for 20-30 minutes at a time.         This is a list of the screening recommended for you and due dates:  Health Maintenance  Topic Date Due   COVID-19 Vaccine (1) Never done   Zoster (Shingles) Vaccine (1 of 2) Never done   DTaP/Tdap/Td vaccine (3 - Td or Tdap) 06/29/2021   Eye exam for diabetics  10/04/2022   Yearly kidney function blood test for diabetes  12/08/2022   Yearly kidney health urinalysis for diabetes  12/08/2022   Hemoglobin A1C  01/20/2023   Complete foot exam   04/09/2023   Medicare Annual Wellness Visit  09/10/2023   Pneumonia Vaccine  Completed   Flu Shot  Completed   DEXA scan (bone density measurement)  Completed   Hepatitis C Screening: USPSTF Recommendation to screen - Ages 58-79 yo.  Completed   HPV Vaccine  Aged Out

## 2022-09-09 NOTE — Progress Notes (Signed)
Subjective:   [name] [name] is a 77 y.o. female who presents for Medicare Annual (Subsequent) preventive examination.  Review of Systems     Objective:    There were no vitals filed for this visit. There is no height or weight on file to calculate BMI.     09/02/2021    8:46 AM 02/13/2021   11:43 AM 02/05/2021    7:26 AM 12/22/2020    7:06 AM 12/12/2020   12:12 PM 09/01/2020   11:10 AM 06/30/2020    5:22 AM  Advanced Directives  Does Patient Have a Medical Advance Directive? No No No No No No No  Would patient like information on creating a medical advance directive? No - Patient declined No - Patient declined  No - Patient declined  No - Patient declined No - Patient declined    Current Medications (verified) Outpatient Encounter Medications as of 09/09/2022  Medication Sig   acetaminophen (TYLENOL) 500 MG tablet Take one tablet two times daily, by mouth, for knee  pain   alendronate (FOSAMAX) 70 MG tablet Take 1 tablet (70 mg total) by mouth every 7 (seven) days. Take with a full glass of water on an empty stomach.   allopurinol (ZYLOPRIM) 100 MG tablet Take by mouth.   aspirin EC 81 MG tablet Take 81 mg by mouth every morning.    blood glucose meter kit and supplies Dispense based on patient and insurance preference. Use to test three times daily (FOR ICD-10 E10.9, E11.9).   carvedilol (COREG) 25 MG tablet TAKE 1 TABLET BY MOUTH TWICE DAILY WITH MEALS.   cetirizine (ZYRTEC) 10 MG tablet Take 1 tablet (10 mg total) by mouth daily.   cholecalciferol (VITAMIN D3) 25 MCG (1000 UNIT) tablet Take 1,000 Units by mouth daily.   colchicine 0.6 MG tablet TAKE 1 TABLET BY MOUTH TWICE DAILY WITH FOOD.   diclofenac Sodium (VOLTAREN) 1 % GEL Apply 1 application topically 4 (four) times daily as needed (pain).   ENTRESTO 24-26 MG TAKE 1 TABLET BY MOUTH TWICE DAILY   ezetimibe (ZETIA) 10 MG tablet Take 1 tablet (10 mg total) by mouth daily.   fenofibrate (TRICOR) 145 MG tablet TAKE (1) TABLET  BY MOUTH ONCE DAILY.   furosemide (LASIX) 40 MG tablet TAKE (1) TABLET BY MOUTH EACH MORNING.   glipiZIDE (GLUCOTROL XL) 2.5 MG 24 hr tablet TAKE ONE TABLET BY MOUTH ONCE DAILY WITH BREAKFAST.   ibuprofen (ADVIL) 800 MG tablet TAKE 1 TABLET BY MOUTH EVERY 8 HOURS AS NEEDED   Lancets (ONETOUCH DELICA PLUS LPFXTK24O) MISC USE AS DIRECTED TO MONITOR BLOOD SUGAR (3) TIMES DAILY.   LITETOUCH PEN NEEDLES 31G X 8 MM MISC USE TWICE DAILY AS DIRECTED.   LIVALO 2 MG TABS TAKE ONE TABLET BY MOUTH ONCE DAILY AFTER SUPPER.   Multiple Vitamins-Minerals (MULTIVITAMIN PO) Take 1 tablet by mouth every morning.    NOVOLOG MIX 70/30 FLEXPEN (70-30) 100 UNIT/ML FlexPen INJECT 12 TO 15 UNITS SUBCUTANEOUSLY TWICE DAILY.   ONETOUCH ULTRA test strip USE TO CHECK BLOOD SUGAR 3 TIMES DAILY   potassium chloride (KLOR-CON) 10 MEQ tablet TAKE ONE TABLET BY MOUTH DAILY. DO NOT TAKE IF NOT TAKING FUROSEMIDE.   spironolactone (ALDACTONE) 25 MG tablet Take 0.5 tablets (12.5 mg total) by mouth daily.   [DISCONTINUED] ferrous sulfate 325 (65 FE) MG EC tablet Take 1 tablet (325 mg total) by mouth 2 (two) times daily.   [DISCONTINUED] FLUoxetine (PROZAC) 10 MG tablet Take 1 tablet (10  mg total) by mouth daily. Take one capsule by mouth once a day   No facility-administered encounter medications on file as of 09/09/2022.    Allergies (verified) Crestor [rosuvastatin], Tramadol, and Penicillins   History: Past Medical History:  Diagnosis Date   Acute hypoxemic respiratory failure due to COVID-19 ([place]) 06/29/2020   Anxiety    Aortic stenosis    Arthritis    Biventricular ICD (implantable cardioverter-defibrillator) in place    March 2021, [employer]  -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   23 mm Edwards Sapien 3 transcatheter heart valve placed via open right transfemoral approach   Symptomatic PVCs    Past Surgical History:  Procedure Laterality Date   BIV ICD INSERTION CRT-D N/A 10/28/2019   Procedure: BIV ICD INSERTION CRT-D;  Surgeon: Evans Lance, MD;  Location: [facility];  Service: Cardiovascular;  Laterality: N/A;   CARDIAC CATHETERIZATION     CESAREAN SECTION  1987   FLEXIBLE BRONCHOSCOPY N/A 01/14/2015   Procedure: FLEXIBLE BRONCHOSCOPY;  Surgeon: [name], MD;  Location: [facility];  Service: Thoracic;  Laterality: N/A;   LEFT AND RIGHT HEART CATHETERIZATION WITH CORONARY ANGIOGRAM N/A 12/05/2011   Procedure: LEFT AND RIGHT HEART CATHETERIZATION WITH CORONARY ANGIOGRAM;  Surgeon: [name], MD;  Location: [facility];  Service: Cardiovascular;  Laterality: N/A;   LEFT AND RIGHT HEART CATHETERIZATION WITH CORONARY ANGIOGRAM N/A 05/23/2014   Procedure: LEFT AND RIGHT HEART CATHETERIZATION WITH CORONARY ANGIOGRAM;  Surgeon: [name], MD;  Location: [facility] C[facility];  Service: Cardiovascular;  Laterality: N/A;   MULTIPLE EXTRACTIONS WITH ALVEOLOPLASTY N/A 02/27/2014   Procedure: Extraction of tooth #'s 2,4,5,6,7,8,9,10,11,12, 22, 23, 24, 25, 26, 28 with alveoloplasty and bilateral mandibular tori reductions;  Surgeon: [name], DDS;  Location: [facility];  Service: Oral Surgery;  Laterality: N/A;   RIGID BRONCHOSCOPY N/A 01/14/2015   Procedure: RIGID BRONCHOSCOPY with Removal Of Foreign Body ;  Surgeon: [name], MD;  Location: [place] [facility];  Service: Thoracic;  Laterality: N/A;   TEE WITHOUT  CARDIOVERSION N/A 03/24/2015   Procedure: TRANSESOPHAGEAL ECHOCARDIOGRAM (TEE);  Surgeon: [name], MD;  Location: [place];  Service: Open Heart Surgery;  Laterality: N/A;   TRANSCATHETER AORTIC VALVE REPLACEMENT, TRANSFEMORAL Bilateral 03/24/2015   Procedure: TRANSCATHETER AORTIC VALVE REPLACEMENT, TRANSFEMORAL;  Surgeon: [name], MD;  Location: [facility];  Service: Open Heart Surgery;  Laterality: Bilateral;   TUBAL LIGATION  1987   VIDEO BRONCHOSCOPY Bilateral 06/05/2014   Procedure: VIDEO BRONCHOSCOPY WITHOUT FLUORO;  Surgeon: [name], MD;  Location: [facility];  Service: Cardiopulmonary;  Laterality: Bilateral;   Family History  Problem Relation Age of Onset   Heart disease Mother    Heart attack Mother 56   Diabetes Mother    Hypertension Mother  Colon cancer Father 11   Breast cancer Sister    Diabetes Brother    Hypertension Brother    Stroke Brother    Cancer Brother    Diabetes Daughter    Congestive Heart Failure Daughter    Hypertension Son    High Cholesterol Son    Congestive Heart Failure Son    Social History   Socioeconomic History   Marital status: Widowed    Spouse name: Not on file   Number of children: 5   Years of education: 11th   Highest education level: Not on file  Occupational History   Occupation: Retired  Tobacco Use   Smoking status: Never   Smokeless tobacco: Never  Vaping Use   Vaping Use: Never used  Substance and Sexual Activity   Alcohol use: No   Drug use: No   Sexual activity: Never    Birth control/protection: None, Post-menopausal  Other Topics Concern   Not on file  Social History Narrative   Not on file   Social Determinants of Health   Financial Resource Strain: Low Risk  (09/02/2021)   Overall Financial Resource Strain (CARDIA)    Difficulty of Paying Living Expenses: Not hard at all  Food Insecurity: No Food Insecurity (09/02/2021)   Hunger Vital Sign    Worried About Running Out of  Food in the Last Year: Never true    [place] in the Last Year: Never true  Transportation Needs: No Transportation Needs (09/02/2021)   PRAPARE - Hydrologist (Medical): No    Lack of Transportation (Non-Medical): No  Physical Activity: Insufficiently Active (09/02/2021)   Exercise Vital Sign    Days of Exercise per Week: 5 days    Minutes of Exercise per Session: 20 min  Stress: No Stress Concern Present (09/02/2021)   [place]    Feeling of Stress : Not at all  Social Connections: Moderately Integrated (09/02/2021)   Social Connection and Isolation Panel [NHANES]    Frequency of Communication with Friends and Family: More than three times a week    Frequency of Social Gatherings with Friends and Family: Three times a week    Attends Religious Services: More than 4 times per year    Active Member of Clubs or Organizations: Yes    Attends Archivist Meetings: Never    Marital Status: Widowed    Tobacco Counseling Counseling given: Not Answered   Clinical Intake:  Diabetic? Yes   Activities of Daily Living     No data to display           Patient Care Team: Fayrene Helper, MD as PCP - General [name] Aloha Gell, MD as PCP - Cardiology (Cardiology) Evans Lance, MD (Cardiology) [name], MD as Consulting Physician (Cardiology)  Indicate any recent Medical Services you may have received from other than [facility] providers in the past year (date may be approximate).     Assessment:   This is a routine wellness examination for [facility].  Hearing/Vision screen No results found.  Dietary issues and exercise activities discussed:     Goals Addressed   None   Depression Screen    07/21/2022   10:25 AM 05/16/2022    2:48 PM 04/08/2022    9:48 AM 09/02/2021    8:44 AM 08/23/2021    8:49 AM 07/07/2021   11:32 AM 02/24/2021   10:31 AM  PHQ  2/9  Scores  PHQ - 2 Score 0 0 0 0 0 0 1  PHQ- 9 Score    0 0 0 2    Fall Risk    07/21/2022   10:25 AM 05/16/2022    2:48 PM 04/08/2022    9:48 AM 04/08/2022    9:47 AM 12/07/2021    2:48 PM  Fall Risk   Falls in the past year? 0 0 0 0 0  Number falls in past yr: 0 0 0 0 0  Injury with Fall? 0 0 0 0 0  Risk for fall due to : No Fall Risks No Fall Risks No Fall Risks No Fall Risks   Follow up Falls evaluation completed Falls evaluation completed Falls evaluation completed Falls evaluation completed     [place]:  Any stairs in or around the home? Yes  If so, are there any without handrails? Yes  Home free of loose throw rugs in walkways, pet beds, electrical cords, etc? Yes  Adequate lighting in your home to reduce risk of falls? Yes   ASSISTIVE DEVICES UTILIZED TO PREVENT FALLS:  Life alert? No  Use of a cane, walker or w/c? Yes  Grab bars in the bathroom? Yes  Shower chair or bench in shower? Yes  Elevated toilet seat or a handicapped toilet? No   TIMED UP AND GO:  Was the test performed? Yes .  Length of time to ambulate 10 feet: 7 sec.   Gait steady and fast with assistive device  Cognitive Function:        09/02/2021    8:53 AM 09/01/2020   11:11 AM 02/06/2019    8:32 AM 02/01/2018    1:18 PM 09/29/2016    3:24 PM  6CIT Screen  What Year? 0 points 0 points 0 points 0 points 0 points  What month? 0 points 0 points 0 points 0 points 0 points  What time? 0 points 0 points 0 points 0 points 0 points  Count back from 20 0 points 0 points 0 points 0 points 0 points  Months in reverse 0 points 0 points 0 points 0 points 0 points  Repeat phrase 0 points 0 points 2 points 0 points 0 points  Total Score 0 points 0 points 2 points 0 points 0 points    Immunizations Immunization History  Administered Date(s) Administered   Fluad Quad(high Dose 65+) 05/13/2019, 07/07/2021, 05/04/2022   Influenza Split 06/30/2011   Influenza Whole  09/18/2007, 05/28/2009, 06/15/2010   Influenza, High Dose Seasonal PF 06/27/2018   Influenza,inj,Quad PF,6+ Mos 05/21/2013, 04/23/2014, 08/18/2015, 06/23/2016, 07/31/2017, 05/11/2020, 07/07/2021   Influenza-Unspecified 04/15/2014, 05/28/2021   Pneumococcal Conjugate-13 09/16/2014   Pneumococcal Polysaccharide-23 05/24/2004, 12/20/2010, 05/28/2021   Td 05/24/2004   Tdap 06/30/2011    TDAP status: Due, Education has been provided regarding the importance of this vaccine. Advised may receive this vaccine at local pharmacy or Health Dept. Aware to provide a copy of the vaccination record if obtained from local pharmacy or Health Dept. Verbalized acceptance and understanding.  Flu Vaccine status: Up to date  Pneumococcal vaccine status: Up to date  Covid-19 vaccine status: Declined, Education has been provided regarding the importance of this vaccine but patient still declined. Advised may receive this vaccine at local pharmacy or Health Dept.or vaccine clinic. Aware to provide a copy of the vaccination record if obtained from local pharmacy or Health Dept. Verbalized acceptance and understanding.  Qualifies for Shingles Vaccine? Yes  Zostavax completed Yes   Shingrix Completed?: No.    Education has been provided regarding the importance of this vaccine. Patient has been advised to call insurance company to determine out of pocket expense if they have not yet received this vaccine. Advised may also receive vaccine at local pharmacy or Health Dept. Verbalized acceptance and understanding.  Screening Tests Health Maintenance  Topic Date Due   COVID-19 Vaccine (1) Never done   Zoster Vaccines- Shingrix (1 of 2) Never done   DTaP/Tdap/Td (3 - Td or Tdap) 06/29/2021   OPHTHALMOLOGY EXAM  10/04/2022   Diabetic kidney evaluation - eGFR measurement  12/08/2022   Diabetic kidney evaluation - Urine ACR  12/08/2022   HEMOGLOBIN A1C  01/20/2023   FOOT EXAM  04/09/2023   Medicare Annual Wellness  (AWV)  09/10/2023   Pneumonia Vaccine 93+ Years old  Completed   INFLUENZA VACCINE  Completed   DEXA SCAN  Completed   Hepatitis C Screening  Completed   HPV VACCINES  Aged Out    Health Maintenance  Health Maintenance Due  Topic Date Due   COVID-19 Vaccine (1) Never done   Zoster Vaccines- Shingrix (1 of 2) Never done   DTaP/Tdap/Td (3 - Td or Tdap) 06/29/2021    Colorectal cancer screening: No longer required.  Patient declined  Mammogram status: Completed 2019. Repeat every year  Bone Density status: Completed 2023. Results reflect: Bone density results: OSTEOPOROSIS. Repeat every 5 years.  Lung Cancer Screening: (Low Dose CT Chest recommended if Age 35-80 years, 30 pack-year currently smoking OR have quit w/in 15years.) does not qualify.   Lung Cancer Screening Referral: NA  Additional Screening:  Hepatitis C Screening: does not qualify; Completed   Vision Screening: Recommended annual ophthalmology exams for early detection of glaucoma and other disorders of the eye. Is the patient up to date with their annual eye exam?  Yes  Who is the provider or what is the name of the office in which the patient attends annual eye exams? [facility] If pt is not established with a provider, would they like to be referred to a provider to establish care? No .   Dental Screening: Recommended annual dental exams for proper oral hygiene  Community Resource Referral / Chronic Care Management: CRR required this visit?  No   CCM required this visit?  No      Plan:     I have personally reviewed and noted the following in the patient's chart:   Medical and social history Use of alcohol, tobacco or illicit drugs  Current medications and supplements including opioid prescriptions. Patient is not currently taking opioid prescriptions. Functional ability and status Nutritional status Physical activity Advanced directives List of other physicians Hospitalizations, surgeries, and ER  visits in previous 12 months Vitals Screenings to include cognitive, depression, and falls Referrals and appointments  In addition, I have reviewed and discussed with patient certain preventive protocols, quality metrics, and best practice recommendations. A written personalized care plan for preventive services as well as general preventive health recommendations were provided to patient.     Smitty Knudsen, CMA   09/09/2022

## 2022-09-13 ENCOUNTER — Other Ambulatory Visit: Payer: Self-pay | Admitting: Family Medicine

## 2022-09-24 ENCOUNTER — Other Ambulatory Visit: Payer: Self-pay | Admitting: Family Medicine

## 2022-10-03 ENCOUNTER — Telehealth: Payer: Self-pay | Admitting: Family Medicine

## 2022-10-03 NOTE — Telephone Encounter (Signed)
Everlean Cherry, [phone]   Needs to know if patient has either DM or CHF inorder to stay in rolled in the Chronic Special Needs Program

## 2022-10-03 NOTE — Telephone Encounter (Signed)
Spoke with Janett Billow confirmed diagnosis

## 2022-10-03 NOTE — Telephone Encounter (Signed)
Tried calling back, no answer.

## 2022-10-03 NOTE — Telephone Encounter (Signed)
[name] [name], [phone]     Needs to know if patient has either DM or CHF inorder to stay in rolled in the Chronic Special Needs Program

## 2022-10-03 NOTE — Telephone Encounter (Signed)
Tried calling back. No answer.

## 2022-10-05 DIAGNOSIS — E119 Type 2 diabetes mellitus without complications: Secondary | ICD-10-CM | POA: Diagnosis not present

## 2022-10-05 LAB — HM DIABETES EYE EXAM

## 2022-10-06 ENCOUNTER — Other Ambulatory Visit: Payer: Self-pay | Admitting: Family Medicine

## 2022-10-06 NOTE — Progress Notes (Signed)
Cardiology Office Note  Date: 10/07/2022   ID: [name] [name], [name] [name] [birth year], MRN [id number]  PCP:  Fayrene Helper, MD  Cardiologist:  Rozann Lesches, MD Electrophysiologist:  None   Chief Complaint  Patient presents with   Cardiac follow-up    History of Present Illness: [name] [name] [name] is a 77 y.o. female last seen in May 2023.  She is here for a routine visit.  Reports no major change in status, NYHA class II dyspnea with typical activities.  Main complaint is limitation related to diffuse arthritic pain.  She uses a cane, denies any recent falls.  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.  Device interrogation from December 2023 revealed normal function.  She does not report any device shocks or syncope.  I personally reviewed her ECG today which shows a ventricular paced rhythm.  Medications are stable from a cardiac perspective and outlined below.  She does not report any fluid retention or leg swelling.  I did review her most recent lab work, creatinine had come down to 1.75.  Past Medical History:  Diagnosis Date   Acute hypoxemic respiratory failure due to COVID-19 ([place]) 06/29/2020   Anxiety    Aortic stenosis    Arthritis    Biventricular ICD (implantable cardioverter-defibrillator) in place    March 2021, Medtronic  -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 Take with a full glass of water on an empty stomach. 4 tablet 11   allopurinol (ZYLOPRIM) 100 MG tablet Take by mouth.     aspirin EC 81 MG tablet Take 81 mg by mouth every morning.      blood glucose meter kit and supplies Dispense based on patient and insurance preference. Use to test three times daily (FOR ICD-10 E10.9, E11.9). 1 each 0   carvedilol (COREG) 25 MG tablet TAKE 1 TABLET BY MOUTH TWICE DAILY WITH MEALS. 60 tablet 6   cetirizine (ZYRTEC) 10 MG tablet Take 1 tablet (10 mg total) by mouth daily. 30 tablet 11   cholecalciferol (VITAMIN D3) 25 MCG (1000 UNIT) tablet Take 1,000 Units by mouth daily.     colchicine 0.6 MG tablet TAKE 1 TABLET BY MOUTH TWICE DAILY WITH FOOD. 60 tablet 0   diclofenac Sodium (VOLTAREN) 1 % GEL Apply 1 application topically 4 (four) times daily as needed (pain).     ENTRESTO 24-26 MG TAKE 1 TABLET  BY MOUTH TWICE DAILY 60 tablet 6   ezetimibe (ZETIA) 10 MG tablet Take 1 tablet (10 mg total) by mouth daily. 90 tablet 0   fenofibrate (TRICOR) 145 MG tablet TAKE (1) TABLET BY MOUTH ONCE DAILY. 30 tablet 0   furosemide (LASIX) 40 MG tablet TAKE (1) TABLET BY MOUTH EACH MORNING. 30 tablet 0   glipiZIDE (GLUCOTROL XL) 2.5 MG 24 hr tablet TAKE ONE TABLET BY MOUTH ONCE DAILY WITH BREAKFAST. 30 tablet 0   ibuprofen (ADVIL) 800 MG tablet TAKE 1 TABLET BY MOUTH EVERY 8 HOURS AS NEEDED 90 tablet 5   Lancets (ONETOUCH DELICA PLUS 123XX123) MISC USE AS DIRECTED TO MONITOR BLOOD SUGAR (3) TIMES DAILY. 100 each 0   LITETOUCH PEN NEEDLES 31G X 8 MM MISC USE TWICE DAILY AS DIRECTED. 100 each 0   LIVALO 2 MG TABS TAKE  ONE TABLET BY MOUTH ONCE DAILY AFTER SUPPER. 90 tablet 0   Multiple Vitamins-Minerals (MULTIVITAMIN PO) Take 1 tablet by mouth every morning.      NOVOLOG MIX 70/30 FLEXPEN (70-30) 100 UNIT/ML FlexPen INJECT 12 TO 15 UNITS SUBCUTANEOUSLY TWICE DAILY. 15 mL 0   ONETOUCH ULTRA test strip USE TO CHECK BLOOD SUGAR 3 TIMES DAILY 200 strip 0   potassium chloride (KLOR-CON) 10 MEQ tablet TAKE ONE TABLET BY MOUTH DAILY. DO NOT TAKE IF NOT TAKING FUROSEMIDE. 30 tablet 0   spironolactone (ALDACTONE) 25 MG tablet Take 0.5 tablets (12.5 mg total) by mouth daily. 45 tablet 3   No current facility-administered medications for this visit.   Allergies:  Crestor [rosuvastatin], Tramadol, and Penicillins   ROS: No orthopnea or PND.  Physical Exam: VS:  BP 107/64   Pulse 70   Ht '5\' 6"'$  (1.676 m)   Wt 191 lb 3.2 oz (86.7 kg)   SpO2 96%   BMI 30.86 kg/m , BMI Body mass index is 30.86 kg/m.  Wt Readings from Last 3 Encounters:  10/07/22 191 lb 3.2 oz (86.7 kg)  09/09/22 188 lb (85.3 kg)  07/27/22 187 lb (84.8 kg)    General: Patient appears comfortable at rest. HEENT: Conjunctiva and lids normal. Neck: Supple, no elevated JVP or carotid bruits. Lungs: Clear to auscultation, nonlabored breathing at rest. Cardiac: Regular rate and rhythm, no S3, 3/6 systolic murmur. Extremities: No pitting edema.  ECG:  An ECG dated 01/03/2022 was personally reviewed today and demonstrated:  Ventricular paced rhythm.  Recent Labwork: 12/07/2021: BUN 51; Creatinine, Ser 2.36; Hemoglobin 11.7; Platelets 227; Potassium 4.9; Sodium 145; TSH 1.510 07/21/2022: ALT 21; AST 28     Component Value Date/Time   CHOL 167 07/21/2022 1127   TRIG 121 07/21/2022 1127   HDL 45 07/21/2022 1127   CHOLHDL 3.7 07/21/2022 1127   CHOLHDL 2.9 08/20/2019 1001   VLDL 21 02/06/2017 0916   LDLCALC 100 (H) 07/21/2022 1127   LDLCALC 94 08/20/2019 1001  January 2024: Hemoglobin 12.1, platelets 158, BUN 52, creatinine 1.75, potassium  4.3  Other Studies Reviewed Today:  Echocardiogram 09/23/2021:  1. EF remains severly depressed compared to echo done 12/21/20. Left  ventricular ejection fraction, by estimation, is <20%. The left ventricle  has severely decreased function. The left ventricle demonstrates global  hypokinesis. The left ventricular  internal cavity size was severely dilated. There is mild left ventricular  hypertrophy. Left ventricular diastolic parameters are indeterminate.   2. Pacing wires in RA/RV . Right ventricular systolic function is normal.  The right ventricular size is normal. There is normal pulmonary artery  systolic pressure.   3. Left atrial size was mildly dilated.   4. The mitral valve is abnormal. Mild mitral valve regurgitation. No  evidence of mitral stenosis.   5. 23 mm Sapien TAVR valve stable gradients no PVL . The aortic valve has  been repaired/replaced. Aortic valve regurgitation is not visualized. No  aortic stenosis is present.   6. The inferior vena cava is normal in size with greater than 50%  respiratory variability, suggesting right atrial pressure of 3 mmHg.   Assessment and Plan:  1.  HFrEF with nonischemic cardiomyopathy, LVEF less than 20% and RV contraction normal by echocardiogram in February of last year.  She is clinically stable with NYHA class II symptoms.  Plan to continue medical therapy including Coreg, Entresto, Aldactone, and Lasix with potassium supplement.  Update echocardiogram.  2.  CKD stage IIIb, continues to follow with Dr. Theador Hawthorne.  Recent creatinine was down to 1.75 with normal potassium.  3.  History of aortic stenosis status post TAVR in 2016.  23 mm SAPIEN THV in place.  Mean gradient 12.5 mmHg and no paravalvular regurgitation by last echocardiogram.  4.  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.  Medication Adjustments/Labs and Tests Ordered: Current medicines are reviewed at length with the patient today.  Concerns regarding  medicines are outlined above.   Tests Ordered: Orders Placed This Encounter  Procedures   EKG 12-Lead   ECHOCARDIOGRAM COMPLETE    Medication Changes: No orders of the defined types were placed in this encounter.   Disposition:  Follow up  6 months.  Signed, [name], MD, [facility] 10/07/2022 9:33 AM    [facility]. [address], [place], [place] [address] Phone: [phone]; Fax: [phone]

## 2022-10-07 ENCOUNTER — Ambulatory Visit: Payer: PPO | Attending: Cardiology | Admitting: Cardiology

## 2022-10-07 ENCOUNTER — Encounter: Payer: Self-pay | Admitting: Cardiology

## 2022-10-07 VITALS — BP 107/64 | HR 70 | Ht 66.0 in | Wt 191.2 lb

## 2022-10-07 DIAGNOSIS — I5020 Unspecified systolic (congestive) heart failure: Secondary | ICD-10-CM | POA: Diagnosis not present

## 2022-10-07 DIAGNOSIS — N1832 Chronic kidney disease, stage 3b: Secondary | ICD-10-CM

## 2022-10-07 DIAGNOSIS — Z952 Presence of prosthetic heart valve: Secondary | ICD-10-CM

## 2022-10-07 NOTE — Patient Instructions (Signed)
Medication Instructions:  Your physician recommends that you continue on your current medications as directed. Please refer to the Current Medication list given to you today.   Labwork: None today   Testing/Procedures: Your physician has requested that you have an echocardiogram. Echocardiography is a painless test that uses sound waves to create images of your heart. It provides your doctor with information about the size and shape of your heart and how well your heart's chambers and valves are working. This procedure takes approximately one hour. There are no restrictions for this procedure. Please do NOT wear cologne, perfume, aftershave, or lotions (deodorant is allowed). Please arrive 15 minutes prior to your appointment time.   Follow-Up: 6 months  Any Other Special Instructions Will Be Listed Below (If Applicable).  If you need a refill on your cardiac medications before your next appointment, please call your pharmacy.

## 2022-10-08 ENCOUNTER — Other Ambulatory Visit: Payer: Self-pay | Admitting: Family Medicine

## 2022-10-24 ENCOUNTER — Ambulatory Visit (INDEPENDENT_AMBULATORY_CARE_PROVIDER_SITE_OTHER): Payer: PPO

## 2022-10-24 DIAGNOSIS — I428 Other cardiomyopathies: Secondary | ICD-10-CM

## 2022-10-24 DIAGNOSIS — I5042 Chronic combined systolic (congestive) and diastolic (congestive) heart failure: Secondary | ICD-10-CM

## 2022-10-25 LAB — CUP PACEART REMOTE DEVICE CHECK
Battery Remaining Longevity: 67 mo
Battery Voltage: 2.92 V
Brady Statistic AP VP Percent: 15.04 %
Brady Statistic AP VS Percent: 0.32 %
Brady Statistic AS VP Percent: 83.38 %
Brady Statistic AS VS Percent: 1.26 %
Brady Statistic RA Percent Paced: 15.27 %
Brady Statistic RV Percent Paced: 0.28 %
Date Time Interrogation Session: 20240311022604
HighPow Impedance: 77 Ohm
Implantable Lead Connection Status: 753985
Implantable Lead Connection Status: 753985
Implantable Lead Connection Status: 753985
Implantable Lead Implant Date: 20210315
Implantable Lead Implant Date: 20210315
Implantable Lead Implant Date: 20210315
Implantable Lead Location: 753858
Implantable Lead Location: 753859
Implantable Lead Location: 753860
Implantable Lead Model: 4598
Implantable Lead Model: 5076
Implantable Lead Model: 6935
Implantable Pulse Generator Implant Date: 20210315
Lead Channel Impedance Value: 1026 Ohm
Lead Channel Impedance Value: 1064 Ohm
Lead Channel Impedance Value: 201.488
Lead Channel Impedance Value: 211.891
Lead Channel Impedance Value: 238.518
Lead Channel Impedance Value: 276.59 Ohm
Lead Channel Impedance Value: 296.578
Lead Channel Impedance Value: 342 Ohm
Lead Channel Impedance Value: 361 Ohm
Lead Channel Impedance Value: 361 Ohm
Lead Channel Impedance Value: 456 Ohm
Lead Channel Impedance Value: 475 Ohm
Lead Channel Impedance Value: 513 Ohm
Lead Channel Impedance Value: 608 Ohm
Lead Channel Impedance Value: 665 Ohm
Lead Channel Impedance Value: 703 Ohm
Lead Channel Impedance Value: 722 Ohm
Lead Channel Impedance Value: 950 Ohm
Lead Channel Pacing Threshold Amplitude: 0.5 V
Lead Channel Pacing Threshold Amplitude: 0.75 V
Lead Channel Pacing Threshold Amplitude: 0.875 V
Lead Channel Pacing Threshold Pulse Width: 0.4 ms
Lead Channel Pacing Threshold Pulse Width: 0.4 ms
Lead Channel Pacing Threshold Pulse Width: 0.4 ms
Lead Channel Sensing Intrinsic Amplitude: 14.875 mV
Lead Channel Sensing Intrinsic Amplitude: 14.875 mV
Lead Channel Sensing Intrinsic Amplitude: 3 mV
Lead Channel Sensing Intrinsic Amplitude: 3 mV
Lead Channel Setting Pacing Amplitude: 1.5 V
Lead Channel Setting Pacing Amplitude: 2 V
Lead Channel Setting Pacing Amplitude: 2.25 V
Lead Channel Setting Pacing Pulse Width: 0.4 ms
Lead Channel Setting Pacing Pulse Width: 0.4 ms
Lead Channel Setting Sensing Sensitivity: 0.3 mV
Zone Setting Status: 755011
Zone Setting Status: 755011

## 2022-10-27 ENCOUNTER — Other Ambulatory Visit: Payer: Self-pay | Admitting: Orthopedic Surgery

## 2022-10-27 ENCOUNTER — Other Ambulatory Visit: Payer: Self-pay | Admitting: Cardiology

## 2022-10-27 ENCOUNTER — Other Ambulatory Visit: Payer: Self-pay | Admitting: Family Medicine

## 2022-10-27 DIAGNOSIS — M1A0610 Idiopathic chronic gout, right knee, without tophus (tophi): Secondary | ICD-10-CM

## 2022-11-07 ENCOUNTER — Other Ambulatory Visit: Payer: Self-pay | Admitting: Family Medicine

## 2022-11-10 ENCOUNTER — Ambulatory Visit (HOSPITAL_COMMUNITY)
Admission: RE | Admit: 2022-11-10 | Discharge: 2022-11-10 | Disposition: A | Discharge status: Home / Self Care | Payer: PPO | Source: Ambulatory Visit | Attending: Cardiology | Admitting: Cardiology

## 2022-11-10 DIAGNOSIS — I5020 Unspecified systolic (congestive) heart failure: Secondary | ICD-10-CM | POA: Insufficient documentation

## 2022-11-10 DIAGNOSIS — Z952 Presence of prosthetic heart valve: Secondary | ICD-10-CM | POA: Insufficient documentation

## 2022-11-10 LAB — ECHOCARDIOGRAM COMPLETE
AV Mean grad: 13.3 mmHg
AV Peak grad: 24.9 mmHg
Ao pk vel: 2.5 m/s
Area-P 1/2: 3.12 cm2
Calc EF: 25.7 %
S' Lateral: 5 cm
Single Plane A2C EF: 30.6 %
Single Plane A4C EF: 20.7 %

## 2022-11-10 NOTE — Progress Notes (Signed)
*  PRELIMINARY RESULTS* Echocardiogram 2D Echocardiogram has been performed.  Samuel Germany 11/10/2022, 1:50 PM

## 2022-11-23 ENCOUNTER — Encounter: Payer: Self-pay | Admitting: Podiatry

## 2022-11-23 ENCOUNTER — Ambulatory Visit (INDEPENDENT_AMBULATORY_CARE_PROVIDER_SITE_OTHER): Payer: PPO | Admitting: Podiatry

## 2022-11-23 DIAGNOSIS — B351 Tinea unguium: Secondary | ICD-10-CM

## 2022-11-23 DIAGNOSIS — E1159 Type 2 diabetes mellitus with other circulatory complications: Secondary | ICD-10-CM | POA: Insufficient documentation

## 2022-11-23 DIAGNOSIS — M79674 Pain in right toe(s): Secondary | ICD-10-CM

## 2022-11-23 DIAGNOSIS — M79675 Pain in left toe(s): Secondary | ICD-10-CM

## 2022-11-23 NOTE — Progress Notes (Signed)
This patient presents to my office for at risk foot care.  This patient requires this care by a professional since this patient will be at risk due to having diabetes.  She was referred to the office by Dr.  Lodema Hong.This patient is unable to cut nails herself since the patient cannot reach her nails.These nails are painful walking and wearing shoes.  This patient presents for at risk foot care today.  General Appearance  Alert, conversant and in no acute stress.  Vascular  Dorsalis pedis and posterior tibial  pulses are  not palpable  bilaterally.  Capillary return is within normal limits  bilaterally. Cold feet. bilaterally.  Neurologic  Senn-Weinstein monofilament wire test within normal limits  bilaterally. Muscle power within normal limits bilaterally.  Nails Thick disfigured discolored nails with subungual debris  from hallux to fifth toes bilaterally. No evidence of bacterial infection or drainage bilaterally.  Orthopedic  No limitations of motion  feet .  No crepitus or effusions noted.  No bony pathology or digital deformities noted.  Skin  normotropic skin with no porokeratosis noted bilaterally.  No signs of infections or ulcers noted.     Onychomycosis  Pain in right toes  Pain in left toes  Consent was obtained for treatment procedures.   Mechanical debridement of nails 1-5  bilaterally performed with a nail nipper.  Filed with dremel without incident.    Return office visit    3 months                 Told patient to return for periodic foot care and evaluation due to potential at risk complications.   Helane Gunther DPM

## 2022-11-29 ENCOUNTER — Other Ambulatory Visit: Payer: Self-pay | Admitting: Cardiology

## 2022-11-29 ENCOUNTER — Other Ambulatory Visit: Payer: Self-pay | Admitting: Family Medicine

## 2022-12-01 NOTE — Progress Notes (Signed)
Remote ICD transmission.   

## 2022-12-06 ENCOUNTER — Other Ambulatory Visit: Payer: Self-pay | Admitting: Family Medicine

## 2022-12-09 ENCOUNTER — Other Ambulatory Visit: Payer: Self-pay | Admitting: Family Medicine

## 2022-12-14 ENCOUNTER — Encounter: Payer: Self-pay | Admitting: Family Medicine

## 2022-12-14 ENCOUNTER — Ambulatory Visit (INDEPENDENT_AMBULATORY_CARE_PROVIDER_SITE_OTHER): Payer: PPO | Admitting: Family Medicine

## 2022-12-14 VITALS — BP 116/70 | HR 78 | Ht 66.0 in | Wt 190.0 lb

## 2022-12-14 DIAGNOSIS — Z1231 Encounter for screening mammogram for malignant neoplasm of breast: Secondary | ICD-10-CM | POA: Diagnosis not present

## 2022-12-14 DIAGNOSIS — M170 Bilateral primary osteoarthritis of knee: Secondary | ICD-10-CM

## 2022-12-14 DIAGNOSIS — E782 Mixed hyperlipidemia: Secondary | ICD-10-CM | POA: Diagnosis not present

## 2022-12-14 DIAGNOSIS — E559 Vitamin D deficiency, unspecified: Secondary | ICD-10-CM | POA: Diagnosis not present

## 2022-12-14 DIAGNOSIS — E1169 Type 2 diabetes mellitus with other specified complication: Secondary | ICD-10-CM | POA: Diagnosis not present

## 2022-12-14 DIAGNOSIS — E785 Hyperlipidemia, unspecified: Secondary | ICD-10-CM

## 2022-12-14 DIAGNOSIS — I10 Essential (primary) hypertension: Secondary | ICD-10-CM

## 2022-12-14 DIAGNOSIS — E1122 Type 2 diabetes mellitus with diabetic chronic kidney disease: Secondary | ICD-10-CM

## 2022-12-14 DIAGNOSIS — I129 Hypertensive chronic kidney disease with stage 1 through stage 4 chronic kidney disease, or unspecified chronic kidney disease: Secondary | ICD-10-CM | POA: Diagnosis not present

## 2022-12-14 DIAGNOSIS — N184 Chronic kidney disease, stage 4 (severe): Secondary | ICD-10-CM | POA: Diagnosis not present

## 2022-12-14 DIAGNOSIS — Z0001 Encounter for general adult medical examination with abnormal findings: Secondary | ICD-10-CM | POA: Diagnosis not present

## 2022-12-14 NOTE — Patient Instructions (Addendum)
F/U in mid September, call if you need me sooner  You are referred to Dr Romeo Apple re knee pain  HBA1C, lipid, cmp and EGFr,  Urine ACR, CBC, TSH and vit D today  Please schedule mammogram at checkout  Think about what you will eat, plan ahead. Choose " clean, green, fresh or frozen" over canned, processed or packaged foods which are more sugary, salty and fatty. 70 to 75% of food eaten should be vegetables and fruit. Three meals at set times with snacks allowed between meals, but they must be fruit or vegetables. Aim to eat over a 12 hour period , example 7 am to 7 pm, and STOP after  your last meal of the day. Drink water,generally about 64 ounces per day, no other drink is as healthy. Fruit juice is best enjoyed in a healthy way, by EATING the fruit.  It is important that you exercise regularly at least 30 minutes 5 times a week. If you develop chest pain, have severe difficulty breathing, or feel very tired, stop exercising immediately and seek medical attention    Thanks for choosing [facility], we consider it a privelige to serve you.

## 2022-12-14 NOTE — Progress Notes (Unsigned)
    [name] [name]     MRN: [id number]      DOB: Jan 04, 1946  Chief Complaint  Patient presents with   Annual Exam    CPE arthritis     HPI: Patient is in for annual physical exam. No other health concerns are expressed or addressed at the visit. Recent labs,  are reviewed. Immunization is reviewed , and  updated if needed.   PE: Pleasant  female, alert and oriented x 3, in no cardio-pulmonary distress. Afebrile. HEENT No facial trauma or asymetry. Sinuses non tender.  Extra occullar muscles intact.. External ears normal, . Neck: supple, no adenopathy,JVD or thyromegaly.No bruits.  Chest: Clear to ascultation bilaterally.No crackles or wheezes. Non tender to palpation  Breast: No asymetry,no masses or lumps. No tenderness. No nipple discharge or inversion. No axillary or supraclavicular adenopathy  Cardiovascular system; Heart sounds normal,  S1 and  S2 ,no S3.  No murmur, or thrill. Apical beat not displaced Peripheral pulses normal.  Abdomen: Soft, non tender, no organomegaly or masses. No bruits. Bowel sounds normal. No guarding, tenderness or rebound.   GU: External genitalia normal female genitalia , normal female distribution of hair. No lesions. Urethral meatus normal in size, no  Prolapse, no lesions visibly  Present. Bladder non tender. Vagina pink and moist , with no visible lesions , discharge present . Adequate pelvic support no  cystocele or rectocele noted Cervix pink and appears healthy, no lesions or ulcerations noted, no discharge noted from os Uterus normal size, no adnexal masses, no cervical motion or adnexal tenderness.   Musculoskeletal exam: Full ROM of spine, hips , shoulders and knees. No deformity ,swelling or crepitus noted. No muscle wasting or atrophy.   Neurologic: Cranial nerves 2 to 12 intact. Power, tone ,sensation and reflexes normal throughout. No disturbance in gait. No tremor.  Skin: Intact, no ulceration, erythema  , scaling or rash noted. Pigmentation normal throughout  Psych; Normal mood and affect. Judgement and concentration normal   Assessment & Plan:  No problem-specific Assessment & Plan notes found for this encounter.

## 2022-12-14 NOTE — Assessment & Plan Note (Signed)

## 2022-12-14 NOTE — Assessment & Plan Note (Signed)
Has had visco gel injections in both knees in 02/2022 requests same in [place]

## 2022-12-15 ENCOUNTER — Encounter: Payer: Self-pay | Admitting: Family Medicine

## 2022-12-15 LAB — CMP14+EGFR
AST: 35 IU/L (ref 0–40)
Albumin/Globulin Ratio: 1.8 (ref 1.2–2.2)
Albumin: 4.4 g/dL (ref 3.8–4.8)
Bilirubin Total: 0.4 mg/dL (ref 0.0–1.2)
Creatinine, Ser: 1.9 mg/dL — ABNORMAL HIGH (ref 0.57–1.00)
Glucose: 175 mg/dL — ABNORMAL HIGH (ref 70–99)
Potassium: 4.1 mmol/L (ref 3.5–5.2)
Sodium: 143 mmol/L (ref 134–144)
Total Protein: 6.9 g/dL (ref 6.0–8.5)
eGFR: 27 mL/min/{1.73_m2} — ABNORMAL LOW (ref >59)

## 2022-12-15 LAB — MICROALBUMIN / CREATININE URINE RATIO

## 2022-12-15 LAB — VITAMIN D 25 HYDROXY (VIT D DEFICIENCY, FRACTURES): Vit D, 25-Hydroxy: 40.7 ng/mL (ref 30.0–100.0)

## 2022-12-15 LAB — CBC
Hematocrit: 36.8 % (ref 34.0–46.6)
MCV: 90 fL (ref 79–97)

## 2022-12-15 LAB — LIPID PANEL
Chol/HDL Ratio: 3.3 ratio (ref 0.0–4.4)
Cholesterol, Total: 164 mg/dL (ref 100–199)
HDL: 49 mg/dL (ref >39)
LDL Chol Calc (NIH): 91 mg/dL (ref 0–99)

## 2022-12-15 LAB — HEMOGLOBIN A1C: Hgb A1c MFr Bld: 7.2 % — ABNORMAL HIGH (ref 4.8–5.6)

## 2022-12-15 NOTE — Assessment & Plan Note (Signed)
[name] is reminded of the importance of commitment to daily physical activity for 30 minutes or more, as able and the need to limit carbohydrate intake to 30 to 60 grams per meal to help with blood sugar control.   The need to take medication as prescribed, test blood sugar as directed, and to call between visits if there is a concern that blood sugar is uncontrolled is also discussed.   [name] is reminded of the importance of daily foot exam, annual eye examination, and good blood sugar, blood pressure and cholesterol control.     Latest Ref Rng & Units 12/14/2022   11:40 AM 07/21/2022   11:27 AM 12/07/2021    3:43 PM 07/19/2021   11:37 AM 07/07/2021   11:45 AM  Diabetic Labs  HbA1c 4.8 - 5.6 % 7.2  7.3  7.5   7.0   Micro/Creat Ratio  WILL FOLLOW  P  12     Chol 100 - 199 mg/dL 098  119  147     HDL >82 mg/dL 49  45  52     Calc LDL 0 - 99 mg/dL 91  956  65     Triglycerides 0 - 149 mg/dL 213  086  578     Creatinine 0.57 - 1.00 mg/dL 4.69   6.29  5.28      P Preliminary result      12/14/2022   10:49 AM 10/07/2022    9:08 AM 09/09/2022    2:09 PM 07/27/2022    9:05 AM 07/21/2022   10:22 AM 04/08/2022    9:40 AM 01/25/2022    2:55 PM  BP/Weight  Systolic BP 116 107 131 110 100 111 110  Diastolic BP 70 64 74 69 62 68 60  Wt. (Lbs) 190.04 191.2 188 187 189.08 193.08 196  BMI 30.67 kg/m2 30.86 kg/m2 30.34 kg/m2 30.18 kg/m2 30.52 kg/m2 31.16 kg/m2 31.64 kg/m2      Latest Ref Rng & Units 10/05/2022   12:00 AM 04/08/2022    9:40 AM  Foot/eye exam completion dates  Eye Exam No Retinopathy No Retinopathy       Foot Form Completion   Done     This result is from an external source.      No med change

## 2022-12-15 NOTE — Assessment & Plan Note (Signed)
Hyperlipidemia:Low fat diet discussed and encouraged.   Lipid Panel  Lab Results  Component Value Date   CHOL 164 12/14/2022   HDL 49 12/14/2022   LDLCALC 91 12/14/2022   TRIG 135 12/14/2022   CHOLHDL 3.3 12/14/2022

## 2022-12-15 NOTE — Assessment & Plan Note (Signed)
Controlled, no change in medication DASH diet and commitment to daily physical activity for a minimum of 30 minutes discussed and encouraged, as a part of hypertension management. The importance of attaining a healthy weight is also discussed.     12/14/2022   10:49 AM 10/07/2022    9:08 AM 09/09/2022    2:09 PM 07/27/2022    9:05 AM 07/21/2022   10:22 AM 04/08/2022    9:40 AM 01/25/2022    2:55 PM  BP/Weight  Systolic BP 116 107 131 110 100 111 110  Diastolic BP 70 64 74 69 62 68 60  Wt. (Lbs) 190.04 191.2 188 187 189.08 193.08 196  BMI 30.67 kg/m2 30.86 kg/m2 30.34 kg/m2 30.18 kg/m2 30.52 kg/m2 31.16 kg/m2 31.64 kg/m2

## 2022-12-16 LAB — CMP14+EGFR
ALT: 25 IU/L (ref 0–32)
Alkaline Phosphatase: 37 IU/L — ABNORMAL LOW (ref 44–121)
BUN/Creatinine Ratio: 24 (ref 12–28)
BUN: 46 mg/dL — ABNORMAL HIGH (ref 8–27)
CO2: 21 mmol/L (ref 20–29)
Calcium: 10.2 mg/dL (ref 8.7–10.3)
Chloride: 104 mmol/L (ref 96–106)
Globulin, Total: 2.5 g/dL (ref 1.5–4.5)

## 2022-12-16 LAB — LIPID PANEL
Triglycerides: 135 mg/dL (ref 0–149)
VLDL Cholesterol Cal: 24 mg/dL (ref 5–40)

## 2022-12-16 LAB — CBC
Hemoglobin: 11.6 g/dL (ref 11.1–15.9)
MCH: 28.4 pg (ref 26.6–33.0)
MCHC: 31.5 g/dL (ref 31.5–35.7)
Platelets: 144 10*3/uL — ABNORMAL LOW (ref 150–450)
RBC: 4.08 x10E6/uL (ref 3.77–5.28)
RDW: 14.7 % (ref 11.7–15.4)
WBC: 5.4 10*3/uL (ref 3.4–10.8)

## 2022-12-16 LAB — TSH: TSH: 3.47 u[IU]/mL (ref 0.450–4.500)

## 2022-12-16 LAB — MICROALBUMIN / CREATININE URINE RATIO: Microalbumin, Urine: <3 ug/mL

## 2022-12-16 LAB — HEMOGLOBIN A1C: Est. average glucose Bld gHb Est-mCnc: 160 mg/dL

## 2022-12-18 ENCOUNTER — Other Ambulatory Visit: Payer: Self-pay | Admitting: Family Medicine

## 2022-12-18 DIAGNOSIS — E1122 Type 2 diabetes mellitus with diabetic chronic kidney disease: Secondary | ICD-10-CM

## 2022-12-19 ENCOUNTER — Ambulatory Visit: Payer: PPO | Admitting: Orthopedic Surgery

## 2022-12-28 ENCOUNTER — Ambulatory Visit (HOSPITAL_COMMUNITY): Payer: PPO

## 2022-12-28 ENCOUNTER — Other Ambulatory Visit: Payer: Self-pay | Admitting: Family Medicine

## 2022-12-30 DIAGNOSIS — Z5181 Encounter for therapeutic drug level monitoring: Secondary | ICD-10-CM | POA: Diagnosis not present

## 2022-12-30 DIAGNOSIS — D696 Thrombocytopenia, unspecified: Secondary | ICD-10-CM | POA: Diagnosis not present

## 2022-12-30 DIAGNOSIS — I129 Hypertensive chronic kidney disease with stage 1 through stage 4 chronic kidney disease, or unspecified chronic kidney disease: Secondary | ICD-10-CM | POA: Diagnosis not present

## 2022-12-30 DIAGNOSIS — I5042 Chronic combined systolic (congestive) and diastolic (congestive) heart failure: Secondary | ICD-10-CM | POA: Diagnosis not present

## 2023-01-02 ENCOUNTER — Ambulatory Visit: Payer: PPO | Admitting: Orthopedic Surgery

## 2023-01-02 ENCOUNTER — Encounter: Payer: Self-pay | Admitting: Orthopedic Surgery

## 2023-01-02 DIAGNOSIS — M25561 Pain in right knee: Secondary | ICD-10-CM

## 2023-01-02 DIAGNOSIS — M1712 Unilateral primary osteoarthritis, left knee: Secondary | ICD-10-CM

## 2023-01-02 DIAGNOSIS — M1711 Unilateral primary osteoarthritis, right knee: Secondary | ICD-10-CM

## 2023-01-02 DIAGNOSIS — G8929 Other chronic pain: Secondary | ICD-10-CM

## 2023-01-02 DIAGNOSIS — M25562 Pain in left knee: Secondary | ICD-10-CM

## 2023-01-02 MED ORDER — METHYLPREDNISOLONE ACETATE 40 MG/ML IJ SUSP
40.0000 mg | Freq: Once | INTRAMUSCULAR | Status: AC
  Administered 2023-01-02: 40 mg via INTRA_ARTICULAR

## 2023-01-02 NOTE — Addendum Note (Signed)
Addended byCaffie Damme on: 01/02/2023 10:19 AM   Modules accepted: Orders

## 2023-01-02 NOTE — Progress Notes (Signed)
Chief Complaint  Patient presents with   Knee Pain    Bilateral wants injections    Encounter Diagnoses  Name Primary?   Chronic pain of right knee Yes   Chronic pain of left knee    Primary osteoarthritis of left knee    Primary osteoarthritis of right knee     77 year old female with nonoperable osteoarthritis both knees  She says she takes Tylenol and occasional ibuprofen comes in with bilateral knee pain worsening associated with weather change and activity  Exam shows quiet knees in terms of effusion she has functional range of motion she uses assistive device to walk  We will inject both knees today  Procedure note for bilateral knee injections  Procedure note left knee injection verbal consent was obtained to inject left knee joint  Timeout was completed to confirm the site of injection  The medications used were 40 mg depomedrol and 3 cc of 1% lidocaine  Anesthesia was provided by ethyl chloride and the skin was prepped with alcohol.  After cleaning the skin with alcohol a 20-gauge needle was used to inject the left knee joint. There were no complications. A sterile bandage was applied.   Procedure note right knee injection verbal consent was obtained to inject right knee joint  Timeout was completed to confirm the site of injection  The medications used were 40 mg depomedrol and 3 cc of 1% lidocaine  Anesthesia was provided by ethyl chloride and the skin was prepped with alcohol.  After cleaning the skin with alcohol a 20-gauge needle was used to inject the right knee joint. There were no complications. A sterile bandage was applied.

## 2023-01-04 ENCOUNTER — Other Ambulatory Visit: Payer: Self-pay | Admitting: Orthopedic Surgery

## 2023-01-04 ENCOUNTER — Other Ambulatory Visit: Payer: Self-pay | Admitting: Family Medicine

## 2023-01-04 DIAGNOSIS — M1A0610 Idiopathic chronic gout, right knee, without tophus (tophi): Secondary | ICD-10-CM

## 2023-01-06 ENCOUNTER — Inpatient Hospital Stay (HOSPITAL_COMMUNITY): Admission: RE | Admit: 2023-01-06 | Payer: PPO | Source: Ambulatory Visit

## 2023-01-13 ENCOUNTER — Other Ambulatory Visit: Payer: Self-pay | Admitting: Family Medicine

## 2023-01-14 ENCOUNTER — Other Ambulatory Visit: Payer: Self-pay | Admitting: Family Medicine

## 2023-01-23 ENCOUNTER — Ambulatory Visit (INDEPENDENT_AMBULATORY_CARE_PROVIDER_SITE_OTHER): Payer: PPO

## 2023-01-23 DIAGNOSIS — I428 Other cardiomyopathies: Secondary | ICD-10-CM | POA: Diagnosis not present

## 2023-01-24 LAB — CUP PACEART REMOTE DEVICE CHECK
Battery Remaining Longevity: 66 mo
Battery Voltage: 2.91 V
Brady Statistic AP VP Percent: 6.57 %
Brady Statistic AP VS Percent: 0.15 %
Brady Statistic AS VP Percent: 91.91 %
Brady Statistic AS VS Percent: 1.36 %
Brady Statistic RA Percent Paced: 6.71 %
Brady Statistic RV Percent Paced: 0.21 %
Date Time Interrogation Session: 20240610033324
HighPow Impedance: 78 Ohm
Implantable Lead Connection Status: 753985
Implantable Lead Connection Status: 753985
Implantable Lead Connection Status: 753985
Implantable Lead Implant Date: 20210315
Implantable Lead Implant Date: 20210315
Implantable Lead Implant Date: 20210315
Implantable Lead Location: 753858
Implantable Lead Location: 753859
Implantable Lead Location: 753860
Implantable Lead Model: 4598
Implantable Lead Model: 5076
Implantable Lead Model: 6935
Implantable Pulse Generator Implant Date: 20210315
Lead Channel Impedance Value: 188.1 Ohm
Lead Channel Impedance Value: 198.837
Lead Channel Impedance Value: 218.88 Ohm
Lead Channel Impedance Value: 247.704
Lead Channel Impedance Value: 266.667
Lead Channel Impedance Value: 342 Ohm
Lead Channel Impedance Value: 361 Ohm
Lead Channel Impedance Value: 361 Ohm
Lead Channel Impedance Value: 418 Ohm
Lead Channel Impedance Value: 456 Ohm
Lead Channel Impedance Value: 475 Ohm
Lead Channel Impedance Value: 589 Ohm
Lead Channel Impedance Value: 608 Ohm
Lead Channel Impedance Value: 646 Ohm
Lead Channel Impedance Value: 703 Ohm
Lead Channel Impedance Value: 874 Ohm
Lead Channel Impedance Value: 893 Ohm
Lead Channel Impedance Value: 950 Ohm
Lead Channel Pacing Threshold Amplitude: 0.5 V
Lead Channel Pacing Threshold Amplitude: 0.75 V
Lead Channel Pacing Threshold Amplitude: 1.125 V
Lead Channel Pacing Threshold Pulse Width: 0.4 ms
Lead Channel Pacing Threshold Pulse Width: 0.4 ms
Lead Channel Pacing Threshold Pulse Width: 0.4 ms
Lead Channel Sensing Intrinsic Amplitude: 14.75 mV
Lead Channel Sensing Intrinsic Amplitude: 14.75 mV
Lead Channel Sensing Intrinsic Amplitude: 3.25 mV
Lead Channel Sensing Intrinsic Amplitude: 3.25 mV
Lead Channel Setting Pacing Amplitude: 1.5 V
Lead Channel Setting Pacing Amplitude: 1.75 V
Lead Channel Setting Pacing Amplitude: 2 V
Lead Channel Setting Pacing Pulse Width: 0.4 ms
Lead Channel Setting Pacing Pulse Width: 0.4 ms
Lead Channel Setting Sensing Sensitivity: 0.3 mV
Zone Setting Status: 755011
Zone Setting Status: 755011

## 2023-01-27 ENCOUNTER — Other Ambulatory Visit: Payer: Self-pay | Admitting: Family Medicine

## 2023-02-03 ENCOUNTER — Other Ambulatory Visit: Payer: Self-pay | Admitting: Family Medicine

## 2023-02-14 ENCOUNTER — Other Ambulatory Visit: Payer: Self-pay | Admitting: Family Medicine

## 2023-02-14 NOTE — Progress Notes (Signed)
Remote ICD transmission.   

## 2023-02-16 ENCOUNTER — Other Ambulatory Visit: Payer: Self-pay | Admitting: Family Medicine

## 2023-02-16 DIAGNOSIS — I129 Hypertensive chronic kidney disease with stage 1 through stage 4 chronic kidney disease, or unspecified chronic kidney disease: Secondary | ICD-10-CM

## 2023-02-21 ENCOUNTER — Ambulatory Visit: Payer: PPO | Admitting: Internal Medicine

## 2023-02-27 ENCOUNTER — Other Ambulatory Visit: Payer: Self-pay | Admitting: Family Medicine

## 2023-03-09 ENCOUNTER — Other Ambulatory Visit: Payer: Self-pay | Admitting: Family Medicine

## 2023-03-16 ENCOUNTER — Other Ambulatory Visit: Payer: Self-pay | Admitting: Family Medicine

## 2023-03-27 ENCOUNTER — Ambulatory Visit: Payer: PPO | Admitting: Podiatry

## 2023-03-29 ENCOUNTER — Other Ambulatory Visit: Payer: Self-pay | Admitting: Family Medicine

## 2023-04-04 DIAGNOSIS — I129 Hypertensive chronic kidney disease with stage 1 through stage 4 chronic kidney disease, or unspecified chronic kidney disease: Secondary | ICD-10-CM | POA: Diagnosis not present

## 2023-04-04 DIAGNOSIS — I5042 Chronic combined systolic (congestive) and diastolic (congestive) heart failure: Secondary | ICD-10-CM | POA: Diagnosis not present

## 2023-04-04 DIAGNOSIS — E1122 Type 2 diabetes mellitus with diabetic chronic kidney disease: Secondary | ICD-10-CM | POA: Diagnosis not present

## 2023-04-04 DIAGNOSIS — N184 Chronic kidney disease, stage 4 (severe): Secondary | ICD-10-CM | POA: Diagnosis not present

## 2023-04-05 ENCOUNTER — Other Ambulatory Visit: Payer: Self-pay | Admitting: Family Medicine

## 2023-04-09 ENCOUNTER — Other Ambulatory Visit: Payer: Self-pay | Admitting: Family Medicine

## 2023-04-13 ENCOUNTER — Ambulatory Visit: Payer: PPO | Attending: Cardiology | Admitting: Cardiology

## 2023-04-13 ENCOUNTER — Encounter: Payer: Self-pay | Admitting: Cardiology

## 2023-04-13 VITALS — BP 114/58 | HR 84 | Ht 66.0 in | Wt 189.0 lb

## 2023-04-13 DIAGNOSIS — Z952 Presence of prosthetic heart valve: Secondary | ICD-10-CM | POA: Diagnosis not present

## 2023-04-13 DIAGNOSIS — N1832 Chronic kidney disease, stage 3b: Secondary | ICD-10-CM

## 2023-04-13 DIAGNOSIS — I5020 Unspecified systolic (congestive) heart failure: Secondary | ICD-10-CM

## 2023-04-13 DIAGNOSIS — I10 Essential (primary) hypertension: Secondary | ICD-10-CM

## 2023-04-13 DIAGNOSIS — Z95810 Presence of automatic (implantable) cardiac defibrillator: Secondary | ICD-10-CM

## 2023-04-13 NOTE — Progress Notes (Signed)
Cardiology Office Note  Date: 04/13/2023   ID: [name] [name] [birth year], MRN [id number]  History of Present Illness: [name] [name] is a 77 y.o. female last seen in February.  She is here for a routine visit.  Reports NYHA class II dyspnea, no significant weight gain or fluid retention.  She has had no palpitations or syncope.  One of her sons just passed away recently, his funeral was this past weekend.  Medtronic CRT-D in place with follow-up with Dr. Ladona Ridgel.  Device check in June revealed normal function.  She does not report any device shocks.  I reviewed her medications, she does not report any change in cardiac regimen or diuretic use.  I reviewed her interval lab work.  She continues to follow with Dr. Lodema Hong.  Echocardiogram from March is noted below.  Physical Exam: VS:  BP (!) 114/58 (BP Location: Right Arm, Patient Position: Sitting, Cuff Size: Normal)   Pulse 84   Ht 5\' 6"  (1.676 m)   Wt 189 lb (85.7 kg)   SpO2 97%   BMI 30.51 kg/m , BMI Body mass index is 30.51 kg/m.  Wt Readings from Last 3 Encounters:  04/13/23 189 lb (85.7 kg)  12/14/22 190 lb 0.6 oz (86.2 kg)  10/07/22 191 lb 3.2 oz (86.7 kg)    General: Patient appears comfortable at rest. HEENT: Conjunctiva and lids normal. Neck: Supple, no elevated JVP or carotid bruits. Lungs: Clear to auscultation, nonlabored breathing at rest. Cardiac: Regular rate and rhythm, no S3, 3/6 systolic murmur. Extremities: No pitting edema.  ECG:  An ECG dated 10/07/2022 was personally reviewed today and demonstrated:  Ventricular paced rhythm.  Labwork: 12/14/2022: ALT 25; AST 35; BUN 46; Creatinine, Ser 1.90; Hemoglobin 11.6; Platelets 144; Potassium 4.1; Sodium 143; TSH 3.470     Component Value Date/Time   CHOL 164 12/14/2022 1140   TRIG 135 12/14/2022 1140   HDL 49 12/14/2022 1140   CHOLHDL 3.3 12/14/2022 1140   CHOLHDL 2.9 08/20/2019 1001   VLDL 21 02/06/2017 0916   LDLCALC 91 12/14/2022 1140   LDLCALC  94 08/20/2019 1001   Other Studies Reviewed Today:  Echocardiogram 11/10/2022:  1. Left ventricular ejection fraction, by estimation, is 20 to 25%. The  left ventricle has severely decreased function. The left ventricle  demonstrates global hypokinesis. There is mild left ventricular  hypertrophy. Left ventricular diastolic parameters   are indeterminate.   2. Right ventricular systolic function is normal. The right ventricular  size is normal. There is normal pulmonary artery systolic pressure.   3. The mitral valve is abnormal. Mild mitral valve regurgitation. No  evidence of mitral stenosis.   4. The tricuspid valve is abnormal.   5. 23 mm Edwards Sapien 3 transcatheter heart valve is in the AV  position. The aortic valve has been repaired/replaced. Aortic valve  regurgitation is not visualized. No aortic stenosis is present.   Assessment and Plan:  1.  HFrEF with nonischemic cardiomyopathy, LVEF 20 to 25% by echocardiogram in March.  Clinically stable with NYHA class II dyspnea and no fluid retention.  Continue Coreg, Lasix with potassium supplement, Entresto, and Aldactone.  2.  History of severe aortic stenosis status post TAVR in 2016, 23 mm SAPIEN THV.  Overall function normal with mean AV gradient 13 mmHg and no aortic regurgitation by echocardiogram in March.  She remains on aspirin.  3.  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 Ladona Ridgel.  No device discharges or syncope.  4.  CKD stage IIIb, creatinine 1.9 in May.  She continues to follow with nephrology.  5.  Essential hypertension.  Blood pressure is well-controlled today.  Disposition:  Follow up  6 months.  Signed, Jonelle Sidle, M.D., F.A.C.C. [facility]

## 2023-04-13 NOTE — Patient Instructions (Signed)
Medication Instructions:  Your physician recommends that you continue on your current medications as directed. Please refer to the Current Medication list given to you today.   Labwork: None today  Testing/Procedures: None today  Follow-Up: 6 months  Any Other Special Instructions Will Be Listed Below (If Applicable).  If you need a refill on your cardiac medications before your next appointment, please call your pharmacy.  

## 2023-04-14 ENCOUNTER — Telehealth: Payer: Self-pay | Admitting: Family Medicine

## 2023-04-14 ENCOUNTER — Other Ambulatory Visit: Payer: Self-pay | Admitting: Family Medicine

## 2023-04-14 ENCOUNTER — Other Ambulatory Visit: Payer: Self-pay | Admitting: Orthopedic Surgery

## 2023-04-14 DIAGNOSIS — I129 Hypertensive chronic kidney disease with stage 1 through stage 4 chronic kidney disease, or unspecified chronic kidney disease: Secondary | ICD-10-CM

## 2023-04-14 DIAGNOSIS — M1A0610 Idiopathic chronic gout, right knee, without tophus (tophi): Secondary | ICD-10-CM

## 2023-04-14 NOTE — Telephone Encounter (Signed)
[facility] called in on patient behalf Urgent request  Patient insulin aspart protamine - aspart (NOVOLOG MIX 70/30 FLEXPEN) (70-30) 100 UNIT/ML FlexPen   Is on back order , need to send in alternative script   Pharmacy prefers vial   Spoke with patient and patient does not want to have to measure insulin herself would prefer to have another alternative insulin pen

## 2023-04-20 ENCOUNTER — Telehealth: Payer: Self-pay | Admitting: Family Medicine

## 2023-04-20 ENCOUNTER — Encounter: Payer: Self-pay | Admitting: Family Medicine

## 2023-04-20 ENCOUNTER — Ambulatory Visit (INDEPENDENT_AMBULATORY_CARE_PROVIDER_SITE_OTHER): Payer: PPO | Admitting: Family Medicine

## 2023-04-20 VITALS — BP 110/64 | HR 71 | Ht 66.0 in | Wt 188.0 lb

## 2023-04-20 DIAGNOSIS — I10 Essential (primary) hypertension: Secondary | ICD-10-CM

## 2023-04-20 DIAGNOSIS — Z634 Disappearance and death of family member: Secondary | ICD-10-CM | POA: Diagnosis not present

## 2023-04-20 DIAGNOSIS — F4321 Adjustment disorder with depressed mood: Secondary | ICD-10-CM | POA: Diagnosis not present

## 2023-04-20 DIAGNOSIS — E782 Mixed hyperlipidemia: Secondary | ICD-10-CM

## 2023-04-20 DIAGNOSIS — E1159 Type 2 diabetes mellitus with other circulatory complications: Secondary | ICD-10-CM | POA: Diagnosis not present

## 2023-04-20 DIAGNOSIS — M159 Polyosteoarthritis, unspecified: Secondary | ICD-10-CM | POA: Diagnosis not present

## 2023-04-20 DIAGNOSIS — N184 Chronic kidney disease, stage 4 (severe): Secondary | ICD-10-CM

## 2023-04-20 DIAGNOSIS — I5042 Chronic combined systolic (congestive) and diastolic (congestive) heart failure: Secondary | ICD-10-CM

## 2023-04-20 DIAGNOSIS — E1169 Type 2 diabetes mellitus with other specified complication: Secondary | ICD-10-CM

## 2023-04-20 MED ORDER — GABAPENTIN 100 MG PO CAPS: 100.0000 mg | ORAL_CAPSULE | Freq: Every day | ORAL | 3 refills | Status: DC

## 2023-04-20 NOTE — Telephone Encounter (Signed)
Patient called was seen this morning provider has not yet sent in prescription to her pharmacy [facility]   Insulin - patient said she is completely out of insulin.   Novolog 12 units????  Call patient to confirm which will be switch over to insulin.  Patient call back # [phone].

## 2023-04-20 NOTE — Patient Instructions (Addendum)
F/u in 4 months, call if you need me sooner  Nurse visit for flu vaccine 05/22/2023 or any day during that week  Please get fasting lipid, cmp and eGFr , hBA1C 05/22/2023 or any day that week that co ordinates with flu vaccine  Current is 15 units am and 12 in pm , and glipizide 2.5 mg daily, we are contacting pharmacy to see your alternatives  My condolence and prayers  Careful not to fall  Thanks for choosing [facility], we consider it a privelige to serve you.

## 2023-04-20 NOTE — Telephone Encounter (Signed)
See other tele msg regarding insulin

## 2023-04-21 NOTE — Telephone Encounter (Signed)
Pt LVM says she has received her medication

## 2023-04-24 ENCOUNTER — Ambulatory Visit (INDEPENDENT_AMBULATORY_CARE_PROVIDER_SITE_OTHER): Payer: PPO

## 2023-04-24 DIAGNOSIS — I428 Other cardiomyopathies: Secondary | ICD-10-CM | POA: Diagnosis not present

## 2023-04-24 DIAGNOSIS — I5020 Unspecified systolic (congestive) heart failure: Secondary | ICD-10-CM

## 2023-04-24 LAB — CUP PACEART REMOTE DEVICE CHECK
Battery Remaining Longevity: 59 mo
Battery Voltage: 2.98 V
Brady Statistic AP VP Percent: 8.28 %
Brady Statistic AP VS Percent: 0.18 %
Brady Statistic AS VP Percent: 90.22 %
Brady Statistic AS VS Percent: 1.32 %
Brady Statistic RA Percent Paced: 8.45 %
Brady Statistic RV Percent Paced: 0.2 %
Date Time Interrogation Session: 20240909043623
HighPow Impedance: 61 Ohm
Implantable Lead Connection Status: 753985
Implantable Lead Connection Status: 753985
Implantable Lead Connection Status: 753985
Implantable Lead Implant Date: 20210315
Implantable Lead Implant Date: 20210315
Implantable Lead Implant Date: 20210315
Implantable Lead Location: 753858
Implantable Lead Location: 753859
Implantable Lead Location: 753860
Implantable Lead Model: 4598
Implantable Lead Model: 5076
Implantable Lead Model: 6935
Implantable Pulse Generator Implant Date: 20210315
Lead Channel Impedance Value: 1007 Ohm
Lead Channel Impedance Value: 1064 Ohm
Lead Channel Impedance Value: 188.1 Ohm
Lead Channel Impedance Value: 205.2 Ohm
Lead Channel Impedance Value: 225.849
Lead Channel Impedance Value: 256.667
Lead Channel Impedance Value: 289.597
Lead Channel Impedance Value: 342 Ohm
Lead Channel Impedance Value: 342 Ohm
Lead Channel Impedance Value: 342 Ohm
Lead Channel Impedance Value: 418 Ohm
Lead Channel Impedance Value: 418 Ohm
Lead Channel Impedance Value: 513 Ohm
Lead Channel Impedance Value: 589 Ohm
Lead Channel Impedance Value: 646 Ohm
Lead Channel Impedance Value: 665 Ohm
Lead Channel Impedance Value: 760 Ohm
Lead Channel Impedance Value: 950 Ohm
Lead Channel Pacing Threshold Amplitude: 0.5 V
Lead Channel Pacing Threshold Amplitude: 0.75 V
Lead Channel Pacing Threshold Amplitude: 1.125 V
Lead Channel Pacing Threshold Pulse Width: 0.4 ms
Lead Channel Pacing Threshold Pulse Width: 0.4 ms
Lead Channel Pacing Threshold Pulse Width: 0.4 ms
Lead Channel Sensing Intrinsic Amplitude: 12.25 mV
Lead Channel Sensing Intrinsic Amplitude: 12.25 mV
Lead Channel Sensing Intrinsic Amplitude: 2.5 mV
Lead Channel Sensing Intrinsic Amplitude: 2.5 mV
Lead Channel Setting Pacing Amplitude: 1.5 V
Lead Channel Setting Pacing Amplitude: 1.75 V
Lead Channel Setting Pacing Amplitude: 2 V
Lead Channel Setting Pacing Pulse Width: 0.4 ms
Lead Channel Setting Pacing Pulse Width: 0.4 ms
Lead Channel Setting Sensing Sensitivity: 0.3 mV
Zone Setting Status: 755011
Zone Setting Status: 755011

## 2023-04-25 ENCOUNTER — Encounter: Payer: Self-pay | Admitting: Family Medicine

## 2023-04-25 DIAGNOSIS — F4321 Adjustment disorder with depressed mood: Secondary | ICD-10-CM | POA: Insufficient documentation

## 2023-04-25 NOTE — Assessment & Plan Note (Signed)
Controlled, no change in medication  

## 2023-04-25 NOTE — Assessment & Plan Note (Signed)
Appropriate response to recent passing of her son, encouraged her to go through the process as healthily as able and reach out fo help if needed, has support of family and friends

## 2023-04-25 NOTE — Assessment & Plan Note (Signed)
Increased and uncontrolled , fall risk reduction discussed

## 2023-04-25 NOTE — Progress Notes (Signed)
[name] [name]     MRN: [id number]      DOB: 11/08/1945  Chief Complaint  Patient presents with   Arthritis    F/u for pain, knee, back, hands, feeling poor circulation in rt. Leg.    Medication Refill    Insulin: insurance will not fill. , spirolactone    Depression    Lost a son recently in the last two weeks .     HPI [name] [name] is here for follow up and re-evaluation of chronic medical conditions, medication management and review of any available recent lab and radiology data.  Preventive health is updated, specifically  Cancer screening and Immunization.   Questions or concerns regarding consultations or procedures which the PT has had in the interim are  addressed. The PT denies any adverse reactions to current medications since the last visit.  Concerns as above  ROS Denies recent fever or chills. Denies sinus pressure, nasal congestion, ear pain or sore throat. Denies chest congestion, productive cough or wheezing. Denies chest pains, palpitations and leg swelling Denies abdominal pain, nausea, vomiting,diarrhea or constipation.   Denies dysuria, frequency, hesitancy or incontinence. . Denies headaches, seizures, numbness, or tingling. . Denies skin break down or rash.   PE  BP 110/64 (BP Location: Right Arm, Patient Position: Sitting, Cuff Size: Large)   Pulse 71   Ht 5\' 6"  (1.676 m)   Wt 188 lb (85.3 kg)   SpO2 98%   BMI 30.34 kg/m   Patient alert and oriented and in no cardiopulmonary distress.  HEENT: No facial asymmetry, EOMI,     Neck supple .  Chest: Clear to auscultation bilaterally.  CVS: S1, S2 systolic  murmur, no S3.Regular rate.  ABD: Soft non tender.   Ext: No edema  MS: Decreased  ROM spine, shoulders, hips and knees.  Skin: Intact, no ulcerations or rash noted.  Psych: Good eye contact, normal affect. Memory intact not anxious or depressed appearing.  CNS: CN 2-12 intact, power,  normal throughout.no focal deficits  noted.   Assessment & Plan  Chronic combined systolic and diastolic heart failure (HCC) Stable , no s/s of decompemsation, continue follow up with cardiology   Generalized osteoarthritis Increased and uncontrolled , fall risk reduction discussed  Hyperlipidemia Hyperlipidemia:Low fat diet discussed and encouraged.   Lipid Panel  Lab Results  Component Value Date   CHOL 164 12/14/2022   HDL 49 12/14/2022   LDLCALC 91 12/14/2022   TRIG 135 12/14/2022   CHOLHDL 3.3 12/14/2022     Updated lab needed at/ before next visit.   Benign essential hypertension Controlled, no change in medication   CKD (chronic kidney disease), stage IV (HCC) Followed by nephrology  Type 2 diabetes mellitus with vascular disease (HCC) [name] [name] is reminded of the importance of commitment to daily physical activity for 30 minutes or more, as able and the need to limit carbohydrate intake to 30 to 60 grams per meal to help with blood sugar control.  Updated lab needed at/ before next visit.   The need to take medication as prescribed, test blood sugar as directed, and to call between visits if there is a concern that blood sugar is uncontrolled is also discussed.   [name] [name] is reminded of the importance of daily foot exam, annual eye examination, and good blood sugar, blood pressure and cholesterol control.     Latest Ref Rng & Units 12/14/2022   11:40 AM 07/21/2022   11:27 AM 12/07/2021  3:43 PM 07/19/2021   11:37 AM 07/07/2021   11:45 AM  Diabetic Labs  HbA1c 4.8 - 5.6 % 7.2  7.3  7.5   7.0   Micro/Creat Ratio 0 - 29 mg/g creat <11   12     Chol 100 - 199 mg/dL 254  270  623     HDL >76 mg/dL 49  45  52     Calc LDL 0 - 99 mg/dL 91  283  65     Triglycerides 0 - 149 mg/dL 151  761  607     Creatinine 0.57 - 1.00 mg/dL 3.71   0.62  6.94        04/20/2023   11:18 AM 04/13/2023   12:53 PM 12/14/2022   10:49 AM 10/07/2022    9:08 AM 09/09/2022    2:09 PM 07/27/2022    9:05 AM  07/21/2022   10:22 AM  BP/Weight  Systolic BP 110 114 116 107 131 110 100  Diastolic BP 64 58 70 64 74 69 62  Wt. (Lbs) 188 189 190.04 191.2 188 187 189.08  BMI 30.34 kg/m2 30.51 kg/m2 30.67 kg/m2 30.86 kg/m2 30.34 kg/m2 30.18 kg/m2 30.52 kg/m2      Latest Ref Rng & Units 10/05/2022   12:00 AM 04/08/2022    9:40 AM  Foot/eye exam completion dates  Eye Exam No Retinopathy No Retinopathy       Foot Form Completion   Done     This result is from an external source.        Grief at loss of child Appropriate response to recent passing of her son, encouraged her to go through the process as healthily as able and reach out fo help if needed, has support of family and friends

## 2023-04-25 NOTE — Assessment & Plan Note (Addendum)
[name] [name] is reminded of the importance of commitment to daily physical activity for 30 minutes or more, as able and the need to limit carbohydrate intake to 30 to 60 grams per meal to help with blood sugar control.  Updated lab needed at/ before next visit.   The need to take medication as prescribed, test blood sugar as directed, and to call between visits if there is a concern that blood sugar is uncontrolled is also discussed.   [name] [name] is reminded of the importance of daily foot exam, annual eye examination, and good blood sugar, blood pressure and cholesterol control.     Latest Ref Rng & Units 12/14/2022   11:40 AM 07/21/2022   11:27 AM 12/07/2021    3:43 PM 07/19/2021   11:37 AM 07/07/2021   11:45 AM  Diabetic Labs  HbA1c 4.8 - 5.6 % 7.2  7.3  7.5   7.0   Micro/Creat Ratio 0 - 29 mg/g creat <11   12     Chol 100 - 199 mg/dL 284  132  440     HDL >10 mg/dL 49  45  52     Calc LDL 0 - 99 mg/dL 91  272  65     Triglycerides 0 - 149 mg/dL 536  644  034     Creatinine 0.57 - 1.00 mg/dL 7.42   5.95  6.38        04/20/2023   11:18 AM 04/13/2023   12:53 PM 12/14/2022   10:49 AM 10/07/2022    9:08 AM 09/09/2022    2:09 PM 07/27/2022    9:05 AM 07/21/2022   10:22 AM  BP/Weight  Systolic BP 110 114 116 107 131 110 100  Diastolic BP 64 58 70 64 74 69 62  Wt. (Lbs) 188 189 190.04 191.2 188 187 189.08  BMI 30.34 kg/m2 30.51 kg/m2 30.67 kg/m2 30.86 kg/m2 30.34 kg/m2 30.18 kg/m2 30.52 kg/m2      Latest Ref Rng & Units 10/05/2022   12:00 AM 04/08/2022    9:40 AM  Foot/eye exam completion dates  Eye Exam No Retinopathy No Retinopathy       Foot Form Completion   Done     This result is from an external source.

## 2023-04-25 NOTE — Assessment & Plan Note (Signed)
Stable , no s/s of decompemsation, continue follow up with cardiology

## 2023-04-25 NOTE — Assessment & Plan Note (Signed)
Hyperlipidemia:Low fat diet discussed and encouraged.   Lipid Panel  Lab Results  Component Value Date   CHOL 164 12/14/2022   HDL 49 12/14/2022   LDLCALC 91 12/14/2022   TRIG 135 12/14/2022   CHOLHDL 3.3 12/14/2022     Updated lab needed at/ before next visit.

## 2023-04-25 NOTE — Assessment & Plan Note (Signed)
Followed by nephrology. 

## 2023-05-01 ENCOUNTER — Other Ambulatory Visit: Payer: Self-pay

## 2023-05-01 ENCOUNTER — Telehealth: Payer: Self-pay | Admitting: Family Medicine

## 2023-05-01 DIAGNOSIS — E1122 Type 2 diabetes mellitus with diabetic chronic kidney disease: Secondary | ICD-10-CM

## 2023-05-01 MED ORDER — GLIPIZIDE ER 2.5 MG PO TB24: 2.5000 mg | ORAL_TABLET | Freq: Every day | ORAL | 0 refills | Status: DC

## 2023-05-01 MED ORDER — FUROSEMIDE 40 MG PO TABS: ORAL_TABLET | ORAL | 0 refills | Status: DC

## 2023-05-01 MED ORDER — ONETOUCH DELICA PLUS LANCET33G MISC: 0 refills | Status: DC

## 2023-05-01 NOTE — Telephone Encounter (Signed)
Patient called pharmacy has not yet received medication from our office. Needs refill  Lancets (ONETOUCH DELICA PLUS LANCET33G) MISC   furosemide (LASIX) 40 MG tablet   glipiZIDE (GLUCOTROL XL) 2.5 MG 24 hr tablet [[id number]   Pharmacy  [facility] - [place], [place] - [address] [place], [place] [address]

## 2023-05-01 NOTE — Telephone Encounter (Signed)
Refills sent

## 2023-05-02 ENCOUNTER — Ambulatory Visit: Payer: PPO | Admitting: Family Medicine

## 2023-05-05 ENCOUNTER — Other Ambulatory Visit: Payer: Self-pay | Admitting: Family Medicine

## 2023-05-05 MED ORDER — HUMALOG MIX 75/25 KWIKPEN (75-25) 100 UNIT/ML ~~LOC~~ SUPN: PEN_INJECTOR | SUBCUTANEOUS | 11 refills | Status: AC

## 2023-05-08 NOTE — Progress Notes (Signed)
Remote ICD transmission.   

## 2023-05-09 ENCOUNTER — Other Ambulatory Visit: Payer: Self-pay | Admitting: Family Medicine

## 2023-05-11 DIAGNOSIS — E782 Mixed hyperlipidemia: Secondary | ICD-10-CM | POA: Diagnosis not present

## 2023-05-11 DIAGNOSIS — E1169 Type 2 diabetes mellitus with other specified complication: Secondary | ICD-10-CM | POA: Diagnosis not present

## 2023-05-11 DIAGNOSIS — I10 Essential (primary) hypertension: Secondary | ICD-10-CM | POA: Diagnosis not present

## 2023-05-12 LAB — LIPID PANEL
Chol/HDL Ratio: 3 {ratio} (ref 0.0–4.4)
Cholesterol, Total: 170 mg/dL (ref 100–199)
HDL: 56 mg/dL (ref >39)
LDL Chol Calc (NIH): 94 mg/dL (ref 0–99)
Triglycerides: 110 mg/dL (ref 0–149)
VLDL Cholesterol Cal: 20 mg/dL (ref 5–40)

## 2023-05-12 LAB — HEMOGLOBIN A1C
Est. average glucose Bld gHb Est-mCnc: 154 mg/dL
Hgb A1c MFr Bld: 7 % — ABNORMAL HIGH (ref 4.8–5.6)

## 2023-05-12 LAB — CMP14+EGFR
ALT: 25 [IU]/L (ref 0–32)
AST: 33 [IU]/L (ref 0–40)
Albumin: 4.1 g/dL (ref 3.8–4.8)
Alkaline Phosphatase: 32 [IU]/L — ABNORMAL LOW (ref 44–121)
BUN/Creatinine Ratio: 19 (ref 12–28)
BUN: 39 mg/dL — ABNORMAL HIGH (ref 8–27)
Bilirubin Total: 0.4 mg/dL (ref 0.0–1.2)
CO2: 23 mmol/L (ref 20–29)
Calcium: 9.7 mg/dL (ref 8.7–10.3)
Chloride: 107 mmol/L — ABNORMAL HIGH (ref 96–106)
Creatinine, Ser: 2.04 mg/dL — ABNORMAL HIGH (ref 0.57–1.00)
Globulin, Total: 2.1 g/dL (ref 1.5–4.5)
Glucose: 97 mg/dL (ref 70–99)
Potassium: 3.8 mmol/L (ref 3.5–5.2)
Sodium: 144 mmol/L (ref 134–144)
Total Protein: 6.2 g/dL (ref 6.0–8.5)
eGFR: 25 mL/min/{1.73_m2} — ABNORMAL LOW (ref >59)

## 2023-05-23 ENCOUNTER — Other Ambulatory Visit: Payer: Self-pay | Admitting: Orthopedic Surgery

## 2023-05-23 ENCOUNTER — Other Ambulatory Visit: Payer: Self-pay | Admitting: Family Medicine

## 2023-05-23 DIAGNOSIS — M1A0610 Idiopathic chronic gout, right knee, without tophus (tophi): Secondary | ICD-10-CM

## 2023-05-24 ENCOUNTER — Other Ambulatory Visit: Payer: Self-pay | Admitting: Family Medicine

## 2023-05-31 ENCOUNTER — Emergency Department (HOSPITAL_COMMUNITY)
Admission: EM | Admit: 2023-05-31 | Discharge: 2023-05-31 | Disposition: A | Discharge status: Home / Self Care | Payer: PPO | Attending: Emergency Medicine | Admitting: Emergency Medicine

## 2023-05-31 ENCOUNTER — Emergency Department (HOSPITAL_COMMUNITY): Payer: PPO

## 2023-05-31 ENCOUNTER — Encounter (HOSPITAL_COMMUNITY): Payer: Self-pay | Admitting: Emergency Medicine

## 2023-05-31 ENCOUNTER — Other Ambulatory Visit: Payer: Self-pay

## 2023-05-31 DIAGNOSIS — M25561 Pain in right knee: Secondary | ICD-10-CM | POA: Diagnosis not present

## 2023-05-31 DIAGNOSIS — Z794 Long term (current) use of insulin: Secondary | ICD-10-CM | POA: Diagnosis not present

## 2023-05-31 DIAGNOSIS — Z7982 Long term (current) use of aspirin: Secondary | ICD-10-CM | POA: Diagnosis not present

## 2023-05-31 DIAGNOSIS — I509 Heart failure, unspecified: Secondary | ICD-10-CM | POA: Diagnosis not present

## 2023-05-31 DIAGNOSIS — M47814 Spondylosis without myelopathy or radiculopathy, thoracic region: Secondary | ICD-10-CM | POA: Insufficient documentation

## 2023-05-31 DIAGNOSIS — M47816 Spondylosis without myelopathy or radiculopathy, lumbar region: Secondary | ICD-10-CM | POA: Diagnosis not present

## 2023-05-31 DIAGNOSIS — M5134 Other intervertebral disc degeneration, thoracic region: Secondary | ICD-10-CM | POA: Diagnosis not present

## 2023-05-31 DIAGNOSIS — E119 Type 2 diabetes mellitus without complications: Secondary | ICD-10-CM | POA: Diagnosis not present

## 2023-05-31 DIAGNOSIS — M4724 Other spondylosis with radiculopathy, thoracic region: Secondary | ICD-10-CM

## 2023-05-31 DIAGNOSIS — M549 Dorsalgia, unspecified: Secondary | ICD-10-CM | POA: Diagnosis not present

## 2023-05-31 DIAGNOSIS — I110 Hypertensive heart disease with heart failure: Secondary | ICD-10-CM | POA: Insufficient documentation

## 2023-05-31 DIAGNOSIS — M25562 Pain in left knee: Secondary | ICD-10-CM | POA: Diagnosis not present

## 2023-05-31 DIAGNOSIS — M5416 Radiculopathy, lumbar region: Secondary | ICD-10-CM | POA: Diagnosis not present

## 2023-05-31 DIAGNOSIS — G8929 Other chronic pain: Secondary | ICD-10-CM | POA: Diagnosis not present

## 2023-05-31 DIAGNOSIS — Z7984 Long term (current) use of oral hypoglycemic drugs: Secondary | ICD-10-CM | POA: Diagnosis not present

## 2023-05-31 DIAGNOSIS — Z79899 Other long term (current) drug therapy: Secondary | ICD-10-CM | POA: Diagnosis not present

## 2023-05-31 DIAGNOSIS — M4316 Spondylolisthesis, lumbar region: Secondary | ICD-10-CM | POA: Diagnosis not present

## 2023-05-31 DIAGNOSIS — M545 Low back pain, unspecified: Secondary | ICD-10-CM | POA: Diagnosis present

## 2023-05-31 LAB — CBG MONITORING, ED: Glucose-Capillary: 78 mg/dL (ref 70–99)

## 2023-05-31 MED ORDER — LIDOCAINE 5 % EX PTCH: 2.0000 | MEDICATED_PATCH | CUTANEOUS | 0 refills | Status: DC

## 2023-05-31 MED ORDER — FUROSEMIDE 40 MG PO TABS: ORAL_TABLET | ORAL | 0 refills | Status: DC

## 2023-05-31 MED ORDER — LIDOCAINE 5 % EX PTCH
2.0000 | MEDICATED_PATCH | CUTANEOUS | Status: DC
  Administered 2023-05-31: 2 via TRANSDERMAL
  Filled 2023-05-31: qty 2

## 2023-05-31 NOTE — ED Provider Notes (Signed)
[place] EMERGENCY DEPARTMENT AT [facility] Provider Note   CSN: [id number] Arrival date & time: 05/31/23  8657     History  Chief Complaint  Patient presents with   Leg Pain   Back Pain    HSER [name] [name] is a 77 y.o. female with a history including hypertension, cardiomyopathy, type 2 diabetes, CHF hyperlipidemia and chronic pain associated with general osteoarthritis, more specific to her bilateral knees under the care of Dr. Romeo Apple, presenting for evaluation of knee pain but also midline upper through lower back pain as well.  She describes an aching quality pain which starts midline mid thoracic region, radiates down her back to her butt cheeks and to her knees bilaterally.  She denies weakness or numbness in her legs and has had no fevers or chills, denies weakness or numbness in her legs.  She is unable to tolerate NSAIDs secondary to kidney concerns but does take arthritis strength Tylenol with no significant improvement in her pain.  She has used Voltaren gel in the past which has been helpful as well for her knees, she is currently out but plans to start this medicine back.  She is scheduled to see Dr. Romeo Apple in 2 days for further evaluation of her pain.  She has been told that she is not a good surgical candidate given her cardiac history.  The history is provided by the patient.       Home Medications Prior to Admission medications   Medication Sig Start Date End Date Taking? Authorizing Provider  lidocaine (LIDODERM) 5 % Place 2 patches onto the skin daily. Remove & Discard patch within 12 hours or as directed by MD 05/31/23  Yes Cabot Cromartie, Raynelle Fanning, PA-C  acetaminophen (TYLENOL) 500 MG tablet Take one tablet two times daily, by mouth, for knee  pain 07/18/19   Kerri Perches, MD  alendronate (FOSAMAX) 70 MG tablet TAKE 1 TAB EACH WEEK 30 MIN PRIOR TO BREAKFAST WITH LARGE GLASS OF WATER. REMAIN UPRIGHT. 04/14/23   Kerri Perches, MD  allopurinol (ZYLOPRIM)  100 MG tablet Take by mouth. 11/04/20 04/13/23  [provider]  aspirin EC 81 MG tablet Take 81 mg by mouth every morning.     [provider]  blood glucose meter kit and supplies Dispense based on patient and insurance preference. Use to test three times daily (FOR ICD-10 E10.9, E11.9). 09/05/22   Kerri Perches, MD  carvedilol (COREG) 25 MG tablet TAKE 1 TABLET BY MOUTH TWICE DAILY WITH MEALS. 11/29/22   Jonelle Sidle, MD  cetirizine (ZYRTEC) 10 MG tablet TAKE ONE TABLET BY MOUTH ONCE DAILY. 12/06/22   Kerri Perches, MD  cholecalciferol (VITAMIN D3) 25 MCG (1000 UNIT) tablet Take 1,000 Units by mouth daily.    [provider]  colchicine 0.6 MG tablet TAKE 1 TABLET BY MOUTH TWICE DAILY WITH FOOD. 05/23/23   Vickki Hearing, MD  diclofenac Sodium (VOLTAREN) 1 % GEL Apply 1 application topically 4 (four) times daily as needed (pain).    [provider]  ezetimibe (ZETIA) 10 MG tablet TAKE ONE TABLET BY MOUTH ONCE DAILY. 04/10/23   Kerri Perches, MD  fenofibrate (TRICOR) 145 MG tablet TAKE (1) TABLET BY MOUTH ONCE DAILY. 05/09/23   Kerri Perches, MD  furosemide (LASIX) 40 MG tablet TAKE (1) TABLET BY MOUTH EACH MORNING. 05/01/23   Kerri Perches, MD  gabapentin (NEURONTIN) 100 MG capsule Take 1 capsule (100 mg total) by mouth at  bedtime. 04/20/23   Kerri Perches, MD  glipiZIDE (GLUCOTROL XL) 2.5 MG 24 hr tablet Take 1 tablet (2.5 mg total) by mouth daily with breakfast. 05/01/23   Kerri Perches, MD  HUMALOG MIX 75/25 KWIKPEN (75-25) 100 UNIT/ML KwikPen Inject 12 to 15 units subcutaneously twice daily 05/05/23   Kerri Perches, MD  Lancets (ONETOUCH DELICA PLUS LANCET33G) MISC USE AS DIRECTED TO TEST BLOOD SUGAR 3 TIMES DAILY. 05/01/23   Kerri Perches, MD  LITETOUCH PEN NEEDLES 31G X 8 MM MISC USE TWICE DAILY AS DIRECTED. 05/23/23   Kerri Perches, MD  Multiple Vitamins-Minerals (MULTIVITAMIN PO) Take 1 tablet by  mouth every morning.     [provider]  NOVOLOG MIX 70/30 FLEXPEN (70-30) 100 UNIT/ML FlexPen INJECT 12 TO 15 UNITS SUBCUTANEOUSLY TWICE DAILY. 05/24/23   Kerri Perches, MD  ONETOUCH ULTRA test strip USE TO CHECK BLOOD SUGAR 3 TIMES DAILY 02/17/23   Kerri Perches, MD  Pitavastatin Calcium 2 MG TABS TAKE ONE TABLET BY MOUTH ONCE DAILY AFTER SUPPER. 05/23/23   Kerri Perches, MD  potassium chloride (KLOR-CON) 10 MEQ tablet TAKE ONE TABLET BY MOUTH DAILY. DO NOT TAKE IF NOT TAKING FUROSEMIDE. 05/09/23   Kerri Perches, MD  sacubitril-valsartan (ENTRESTO) 24-26 MG TAKE 1 TABLET BY MOUTH TWICE DAILY 11/29/22   Jonelle Sidle, MD  spironolactone (ALDACTONE) 25 MG tablet TAKE (1/2) TABLET BY MOUTH ONCE DAILY. 10/27/22   Jonelle Sidle, MD  ferrous sulfate 325 (65 FE) MG EC tablet Take 1 tablet (325 mg total) by mouth 2 (two) times daily. 07/14/11 10/28/11  Kerri Perches, MD  FLUoxetine (PROZAC) 10 MG tablet Take 1 tablet (10 mg total) by mouth daily. Take one capsule by mouth once a day 12/20/10 11/03/11  Kerri Perches, MD      Allergies    Crestor [rosuvastatin], Tramadol, and Penicillins    Review of Systems   Review of Systems  Constitutional:  Negative for fever.  Musculoskeletal:  Positive for arthralgias and back pain. Negative for joint swelling and myalgias.  Neurological:  Negative for weakness and numbness.  All other systems reviewed and are negative.   Physical Exam Updated Vital Signs BP 102/65   Pulse 70   Temp 98.2 F (36.8 C)   Resp 18   SpO2 100%  Physical Exam Vitals and nursing note reviewed.  Constitutional:      Appearance: She is well-developed.  HENT:     Head: Normocephalic and atraumatic.  Eyes:     Conjunctiva/sclera: Conjunctivae normal.  Cardiovascular:     Rate and Rhythm: Normal rate.     Pulses: Normal pulses.     Comments: Pulses equal bilaterally Pulmonary:     Effort: Pulmonary effort is normal.   Abdominal:     General: Bowel sounds are normal. There is no distension.     Palpations: Abdomen is soft. There is no mass.  Musculoskeletal:        General: Tenderness present. Normal range of motion.     Cervical back: Normal range of motion and neck supple.     Thoracic back: Bony tenderness present.     Lumbar back: Tenderness and bony tenderness present. No swelling, edema or spasms.       Back:     Comments: Generalized pain with palpation down her thoracic and lumbar spine.  There is no specific point tenderness, no crepitus, no erythema or edema noted.  Skin:  General: Skin is warm and dry.  Neurological:     Mental Status: She is alert.     Sensory: No sensory deficit.     Motor: No weakness, tremor or atrophy.     Gait: Gait normal.     Deep Tendon Reflexes: Reflexes normal.     Comments: No strength deficit noted in hip and knee flexor and extensor muscle groups.  Ankle flexion and extension intact.      ED Results / Procedures / Treatments   Labs (all labs ordered are listed, but only abnormal results are displayed) Labs Reviewed  CBG MONITORING, ED    EKG None  Radiology DG Lumbar Spine Complete  Result Date: 05/31/2023 CLINICAL DATA:  Chronic back pain without known injury. EXAM: LUMBAR SPINE - COMPLETE 4+ VIEW COMPARISON:  October 21, 2019. FINDINGS: Mild grade 1 anterolisthesis of L4-5 and L5-S1 is noted secondary to posterior facet joint hypertrophy. Disc spaces are well-maintained. No fracture is noted. IMPRESSION: Multilevel degenerative changes as noted above. No acute abnormality seen. Electronically Signed   By: Lupita Raider M.D.   On: 05/31/2023 13:40   DG Thoracic Spine 2 View  Result Date: 05/31/2023 CLINICAL DATA:  Chronic back pain without known injury. EXAM: THORACIC SPINE 2 VIEWS COMPARISON:  None Available. FINDINGS: There is no evidence of thoracic spine fracture. Alignment is normal. Moderate degenerative disc disease is noted at multiple  levels of the lower thoracic spine. IMPRESSION: Moderate multilevel degenerative disc disease. No acute abnormality seen. Electronically Signed   By: Lupita Raider M.D.   On: 05/31/2023 13:38    Procedures Procedures    Medications Ordered in ED Medications  lidocaine (LIDODERM) 5 % 2 patch (2 patches Transdermal Patch Applied 05/31/23 1133)    ED Course/ Medical Decision Making/ A&P                                 Medical Decision Making Patient presenting with acute on chronic spine pain from her thoracic region through her lumbar region, also with bilateral knee pain with already known diagnosis of advanced degenerative joint disease under the care of Dr. Romeo Apple.  No red flag findings for fracture, patient had no recent falls or injury.  No fevers, discitis or epidural abscess unlikely, she has no focal tenderness.  Imaging per below, significant degenerative changes.  She was given Lidoderm patches 1 for her lumbar and 1 for her mid thoracic region after which she endorsed her pain had completely resolved.  She was encouraged to keep her appointment with Dr. Romeo Apple in 2 days.  She was given a prescription for additional Lidoderm patches.  Amount and/or Complexity of Data Reviewed Radiology: ordered.    Details: Imaging was obtained including of the thoracic spine and lumbar spine, she does have moderate degenerative joint changes on both of these films, suggesting the source of her pain.  She has no neurologic deficits  Risk Prescription drug management.           Final Clinical Impression(s) / ED Diagnoses Final diagnoses:  Osteoarthritis of spine with radiculopathy, thoracic region  Spondylosis of lumbar region without myelopathy or radiculopathy    Rx / DC Orders ED Discharge Orders          Ordered    lidocaine (LIDODERM) 5 %  Every 24 hours        05/31/23 1405  Burgess Amor, PA-C 05/31/23 1657    Bethann Berkshire, MD 06/01/23  1630

## 2023-05-31 NOTE — ED Notes (Signed)
Pt given juice and peanut butter crackers.

## 2023-05-31 NOTE — ED Triage Notes (Signed)
Pt here from home with c/o chronic right leg and back pain , no trauma noted , pt has appointment Friday for knee

## 2023-06-02 ENCOUNTER — Ambulatory Visit: Payer: PPO | Admitting: Orthopedic Surgery

## 2023-06-02 DIAGNOSIS — M170 Bilateral primary osteoarthritis of knee: Secondary | ICD-10-CM

## 2023-06-02 DIAGNOSIS — M545 Low back pain, unspecified: Secondary | ICD-10-CM

## 2023-06-02 DIAGNOSIS — M1712 Unilateral primary osteoarthritis, left knee: Secondary | ICD-10-CM

## 2023-06-02 DIAGNOSIS — M1711 Unilateral primary osteoarthritis, right knee: Secondary | ICD-10-CM

## 2023-06-02 MED ORDER — METHYLPREDNISOLONE ACETATE 40 MG/ML IJ SUSP
40.0000 mg | Freq: Once | INTRAMUSCULAR | Status: AC
Start: 2023-06-02 — End: 2023-06-02
  Administered 2023-06-02: 40 mg via INTRA_ARTICULAR

## 2023-06-02 NOTE — Addendum Note (Signed)
Addended byCaffie Damme on: 06/02/2023 10:40 AM   Modules accepted: Orders

## 2023-06-02 NOTE — Progress Notes (Signed)
Chief Complaint  Patient presents with   Knee Pain    Inj bil knee    Encounter Diagnoses  Name Primary?   Primary osteoarthritis of left knee Yes   Primary osteoarthritis of right knee     Procedure note for bilateral knee injections  Procedure note left knee injection verbal consent was obtained to inject left knee joint  Timeout was completed to confirm the site of injection  The medications used were 40 mg depomedrol and 3 cc of 1% lidocaine  Anesthesia was provided by ethyl chloride and the skin was prepped with alcohol.  After cleaning the skin with alcohol a 20-gauge needle was used to inject the left knee joint. There were no complications. A sterile bandage was applied.   Procedure note right knee injection verbal consent was obtained to inject right knee joint  Timeout was completed to confirm the site of injection  The medications used were 40 mg depomedrol and 3 cc of 1% lidocaine  Anesthesia was provided by ethyl chloride and the skin was prepped with alcohol.  After cleaning the skin with alcohol a 20-gauge needle was used to inject the right knee joint. There were no complications. A sterile bandage was applied.

## 2023-06-02 NOTE — Patient Instructions (Addendum)
You have received an injection of steroids into the joint. 15% of patients will have increased pain within the 24 hours postinjection.   This is transient and will go away.   We recommend that you use ice packs on the injection site for 20 minutes every 2 hours and extra strength Tylenol 2 tablets every 8 as needed until the pain resolves.  If you continue to have pain after taking the Tylenol and using the ice please call the office for further instructions.  We will send information for pain management in [place] they will call you the number is [phone]

## 2023-06-03 ENCOUNTER — Other Ambulatory Visit: Payer: Self-pay | Admitting: Family Medicine

## 2023-06-13 ENCOUNTER — Other Ambulatory Visit: Payer: Self-pay | Admitting: Family Medicine

## 2023-06-13 DIAGNOSIS — I129 Hypertensive chronic kidney disease with stage 1 through stage 4 chronic kidney disease, or unspecified chronic kidney disease: Secondary | ICD-10-CM

## 2023-06-15 ENCOUNTER — Encounter: Payer: Self-pay | Admitting: Family Medicine

## 2023-06-15 ENCOUNTER — Ambulatory Visit (INDEPENDENT_AMBULATORY_CARE_PROVIDER_SITE_OTHER): Payer: PPO | Admitting: Family Medicine

## 2023-06-15 VITALS — BP 107/57 | HR 79 | Ht 66.0 in | Wt 184.0 lb

## 2023-06-15 DIAGNOSIS — Z09 Encounter for follow-up examination after completed treatment for conditions other than malignant neoplasm: Secondary | ICD-10-CM

## 2023-06-15 DIAGNOSIS — E785 Hyperlipidemia, unspecified: Secondary | ICD-10-CM | POA: Diagnosis not present

## 2023-06-15 DIAGNOSIS — E1122 Type 2 diabetes mellitus with diabetic chronic kidney disease: Secondary | ICD-10-CM | POA: Diagnosis not present

## 2023-06-15 DIAGNOSIS — E1169 Type 2 diabetes mellitus with other specified complication: Secondary | ICD-10-CM

## 2023-06-15 DIAGNOSIS — E1159 Type 2 diabetes mellitus with other circulatory complications: Secondary | ICD-10-CM

## 2023-06-15 DIAGNOSIS — Z23 Encounter for immunization: Secondary | ICD-10-CM | POA: Diagnosis not present

## 2023-06-15 DIAGNOSIS — N184 Chronic kidney disease, stage 4 (severe): Secondary | ICD-10-CM

## 2023-06-15 DIAGNOSIS — I10 Essential (primary) hypertension: Secondary | ICD-10-CM

## 2023-06-15 DIAGNOSIS — I129 Hypertensive chronic kidney disease with stage 1 through stage 4 chronic kidney disease, or unspecified chronic kidney disease: Secondary | ICD-10-CM | POA: Diagnosis not present

## 2023-06-15 DIAGNOSIS — M5410 Radiculopathy, site unspecified: Secondary | ICD-10-CM

## 2023-06-15 MED ORDER — PREGABALIN 25 MG PO CAPS: 25.0000 mg | ORAL_CAPSULE | Freq: Every day | ORAL | 3 refills | Status: DC

## 2023-06-15 MED ORDER — PREDNISONE 10 MG PO TABS: 10.0000 mg | ORAL_TABLET | Freq: Two times a day (BID) | ORAL | 0 refills | Status: DC

## 2023-06-15 NOTE — Patient Instructions (Signed)
Keep January appoint,emt, call if you need me [place]  Non fasting hBA1C, chem 7 Jan 3 or shortly after  Flu vaccine today  Prednisone for 5 days only and lyrica, new medication for scaitic and arthritc pain is prescribed, stop gabapentin, not working for you   Careful not to fall  Thanks for choosing [facility], we consider it a privelige to serve you.

## 2023-06-19 DIAGNOSIS — Z23 Encounter for immunization: Secondary | ICD-10-CM | POA: Insufficient documentation

## 2023-06-19 DIAGNOSIS — M5410 Radiculopathy, site unspecified: Secondary | ICD-10-CM | POA: Insufficient documentation

## 2023-06-19 DIAGNOSIS — Z09 Encounter for follow-up examination after completed treatment for conditions other than malignant neoplasm: Secondary | ICD-10-CM | POA: Insufficient documentation

## 2023-06-19 NOTE — Assessment & Plan Note (Signed)
After obtaining informed consent, the vaccine is  administered , with no adverse effect noted at the time of administration.  

## 2023-06-19 NOTE — Assessment & Plan Note (Signed)
Controlled, no change in medication  

## 2023-06-19 NOTE — Progress Notes (Signed)
[name] [name]     MRN: [id number]      DOB: 1946/05/21  Chief Complaint  Patient presents with   Back Pain    Back/neck pain goes down into legs    HPI [name] [name] is here for follow up of recent ED visit on 05/31/2023 when she c/o uncontrolled back pain.Had gabapentin prescribed but did not complete even 30 day supply stating that is was  of no benefit. Has had no falls or near falls, just reports chronic, uncontrolled and worsening spine pain Denies new weakness , numbness or incontinence  ROS: Denies recent fever or chills. Denies sinus pressure, nasal congestion, ear pain or sore throat. Denies chest congestion, productive cough or wheezing. Denies chest pains, palpitations and leg swelling Denies abdominal pain, nausea, vomiting,diarrhea or constipation.   Denies dysuria, frequency, hesitancy or incontinence. Denies skin break down or rash.   PE  BP (!) 107/57 (BP Location: Right Arm, Patient Position: Sitting, Cuff Size: Large)   Pulse 79   Ht 5\' 6"  (1.676 m)   Wt 184 lb 0.6 oz (83.5 kg)   SpO2 97%   BMI 29.70 kg/m   Patient alert and oriented and in no cardiopulmonary distress.Patient uncomfortable and In pain  HEENT: No facial asymmetry, EOMI,     Neck decreased ROM,   Chest : CTA bilaterally  CVS: S1, S2, no S3 no JVD  ABD: Soft non tender.   Ext: No edema  MS: markedly decreased ROM lumbar spine, shoulders, hips and knees.  Skin: Intact, no ulcerations or rash noted.  Psych: Good eye contact, normal affect. Memory intact not anxious or depressed appearing.  CNS: CN 2-12 intact, power,  normal throughout.no focal deficits noted.   Assessment & Plan  Encounter for immunization After obtaining informed consent, the vaccine is  administered , with no adverse effect noted at the time of administration.   Back pain with radiculopathy Increased and uncontrolled pain, 5 day course of prednisone and lyrica prescribed, f/I in 2 months to  reassess  Type 2 DM with CKD stage 4 and hypertension (HCC) [name] [name] is reminded of the importance of commitment to daily physical activity for 30 minutes or more, as able and the need to limit carbohydrate intake to 30 to 60 grams per meal to help with blood sugar control.   The need to take medication as prescribed, test blood sugar as directed, and to call between visits if there is a concern that blood sugar is uncontrolled is also discussed.   [name] [name] is reminded of the importance of daily foot exam, annual eye examination, and good blood sugar, blood pressure and cholesterol control.     Latest Ref Rng & Units 05/11/2023    9:11 AM 12/14/2022   11:40 AM 07/21/2022   11:27 AM 12/07/2021    3:43 PM 07/19/2021   11:37 AM  Diabetic Labs  HbA1c 4.8 - 5.6 % 7.0  7.2  7.3  7.5    Micro/Creat Ratio 0 - 29 mg/g creat  <11   12    Chol 100 - 199 mg/dL 366  440  347  425    HDL >39 mg/dL 56  49  45  52    Calc LDL 0 - 99 mg/dL 94  91  956  65    Triglycerides 0 - 149 mg/dL 387  564  332  951    Creatinine 0.57 - 1.00 mg/dL 8.84  1.66   0.63  0.16  06/15/2023   10:46 AM 05/31/2023   10:12 AM 04/20/2023   11:18 AM 04/13/2023   12:53 PM 12/14/2022   10:49 AM 10/07/2022    9:08 AM 09/09/2022    2:09 PM  BP/Weight  Systolic BP 107 102 110 114 116 107 131  Diastolic BP 57 65 64 58 70 64 74  Wt. (Lbs) 184.04  188 189 190.04 191.2 188  BMI 29.7 kg/m2  30.34 kg/m2 30.51 kg/m2 30.67 kg/m2 30.86 kg/m2 30.34 kg/m2      Latest Ref Rng & Units 10/05/2022   12:00 AM 04/08/2022    9:40 AM  Foot/eye exam completion dates  Eye Exam No Retinopathy No Retinopathy       Foot Form Completion   Done     This result is from an external source.      Controlled, no change in medication   Benign essential hypertension Controlled, no change in medication   Hyperlipidemia associated with type 2 diabetes mellitus (HCC) Hyperlipidemia:Low fat diet discussed and encouraged.   Lipid Panel   Lab Results  Component Value Date   CHOL 170 05/11/2023   HDL 56 05/11/2023   LDLCALC 94 05/11/2023   TRIG 110 05/11/2023   CHOLHDL 3.0 05/11/2023     Controlled, no change in medication   Encounter for examination following treatment at hospital Patient in for follow up of recent ED visit fro back and neck pain. Discharge summary, and laboratory and radiology data are reviewed, and any questions or concerns  are discussed. Specific issues requiring follow up are specifically addressed.

## 2023-06-19 NOTE — Assessment & Plan Note (Signed)
[name] [name] is reminded of the importance of commitment to daily physical activity for 30 minutes or more, as able and the need to limit carbohydrate intake to 30 to 60 grams per meal to help with blood sugar control.   The need to take medication as prescribed, test blood sugar as directed, and to call between visits if there is a concern that blood sugar is uncontrolled is also discussed.   [name] [name] is reminded of the importance of daily foot exam, annual eye examination, and good blood sugar, blood pressure and cholesterol control.     Latest Ref Rng & Units 05/11/2023    9:11 AM 12/14/2022   11:40 AM 07/21/2022   11:27 AM 12/07/2021    3:43 PM 07/19/2021   11:37 AM  Diabetic Labs  HbA1c 4.8 - 5.6 % 7.0  7.2  7.3  7.5    Micro/Creat Ratio 0 - 29 mg/g creat  <11   12    Chol 100 - 199 mg/dL 161  096  045  409    HDL >39 mg/dL 56  49  45  52    Calc LDL 0 - 99 mg/dL 94  91  811  65    Triglycerides 0 - 149 mg/dL 914  782  956  213    Creatinine 0.57 - 1.00 mg/dL 0.86  5.78   4.69  6.29       06/15/2023   10:46 AM 05/31/2023   10:12 AM 04/20/2023   11:18 AM 04/13/2023   12:53 PM 12/14/2022   10:49 AM 10/07/2022    9:08 AM 09/09/2022    2:09 PM  BP/Weight  Systolic BP 107 102 110 114 116 107 131  Diastolic BP 57 65 64 58 70 64 74  Wt. (Lbs) 184.04  188 189 190.04 191.2 188  BMI 29.7 kg/m2  30.34 kg/m2 30.51 kg/m2 30.67 kg/m2 30.86 kg/m2 30.34 kg/m2      Latest Ref Rng & Units 10/05/2022   12:00 AM 04/08/2022    9:40 AM  Foot/eye exam completion dates  Eye Exam No Retinopathy No Retinopathy       Foot Form Completion   Done     This result is from an external source.      Controlled, no change in medication

## 2023-06-19 NOTE — Assessment & Plan Note (Signed)
Increased and uncontrolled pain, 5 day course of prednisone and lyrica prescribed, f/I in 2 months to reassess

## 2023-06-19 NOTE — Assessment & Plan Note (Signed)
Patient in for follow up of recent ED visit fro back and neck pain. Discharge summary, and laboratory and radiology data are reviewed, and any questions or concerns  are discussed. Specific issues requiring follow up are specifically addressed.

## 2023-06-19 NOTE — Assessment & Plan Note (Signed)
Hyperlipidemia:Low fat diet discussed and encouraged.   Lipid Panel  Lab Results  Component Value Date   CHOL 170 05/11/2023   HDL 56 05/11/2023   LDLCALC 94 05/11/2023   TRIG 110 05/11/2023   CHOLHDL 3.0 05/11/2023     Controlled, no change in medication

## 2023-06-22 ENCOUNTER — Other Ambulatory Visit: Payer: Self-pay | Admitting: Cardiology

## 2023-06-28 ENCOUNTER — Other Ambulatory Visit: Payer: Self-pay

## 2023-06-28 MED ORDER — EZETIMIBE 10 MG PO TABS: 10.0000 mg | ORAL_TABLET | Freq: Every day | ORAL | 0 refills | Status: DC

## 2023-06-28 MED ORDER — GLIPIZIDE ER 2.5 MG PO TB24: 2.5000 mg | ORAL_TABLET | Freq: Every day | ORAL | 0 refills | Status: DC

## 2023-07-04 ENCOUNTER — Other Ambulatory Visit: Payer: Self-pay

## 2023-07-04 MED ORDER — FENOFIBRATE 145 MG PO TABS: ORAL_TABLET | ORAL | 0 refills | Status: DC

## 2023-07-04 MED ORDER — FUROSEMIDE 40 MG PO TABS: ORAL_TABLET | ORAL | 0 refills | Status: DC

## 2023-07-05 DIAGNOSIS — I129 Hypertensive chronic kidney disease with stage 1 through stage 4 chronic kidney disease, or unspecified chronic kidney disease: Secondary | ICD-10-CM | POA: Diagnosis not present

## 2023-07-05 DIAGNOSIS — E611 Iron deficiency: Secondary | ICD-10-CM | POA: Diagnosis not present

## 2023-07-05 DIAGNOSIS — N184 Chronic kidney disease, stage 4 (severe): Secondary | ICD-10-CM | POA: Diagnosis not present

## 2023-07-05 DIAGNOSIS — D638 Anemia in other chronic diseases classified elsewhere: Secondary | ICD-10-CM | POA: Diagnosis not present

## 2023-07-05 DIAGNOSIS — I5042 Chronic combined systolic (congestive) and diastolic (congestive) heart failure: Secondary | ICD-10-CM | POA: Diagnosis not present

## 2023-07-05 DIAGNOSIS — E1122 Type 2 diabetes mellitus with diabetic chronic kidney disease: Secondary | ICD-10-CM | POA: Diagnosis not present

## 2023-07-10 DIAGNOSIS — D638 Anemia in other chronic diseases classified elsewhere: Secondary | ICD-10-CM | POA: Diagnosis not present

## 2023-07-10 DIAGNOSIS — I5042 Chronic combined systolic (congestive) and diastolic (congestive) heart failure: Secondary | ICD-10-CM | POA: Diagnosis not present

## 2023-07-10 DIAGNOSIS — E1122 Type 2 diabetes mellitus with diabetic chronic kidney disease: Secondary | ICD-10-CM | POA: Diagnosis not present

## 2023-07-10 DIAGNOSIS — I129 Hypertensive chronic kidney disease with stage 1 through stage 4 chronic kidney disease, or unspecified chronic kidney disease: Secondary | ICD-10-CM | POA: Diagnosis not present

## 2023-07-16 ENCOUNTER — Other Ambulatory Visit: Payer: Self-pay | Admitting: Family Medicine

## 2023-07-19 ENCOUNTER — Other Ambulatory Visit: Payer: Self-pay | Admitting: Cardiology

## 2023-07-20 ENCOUNTER — Encounter: Payer: Self-pay | Admitting: Family Medicine

## 2023-07-20 ENCOUNTER — Ambulatory Visit (INDEPENDENT_AMBULATORY_CARE_PROVIDER_SITE_OTHER): Payer: PPO | Admitting: Family Medicine

## 2023-07-20 VITALS — BP 129/79 | HR 85 | Ht 66.0 in | Wt 186.0 lb

## 2023-07-20 DIAGNOSIS — B349 Viral infection, unspecified: Secondary | ICD-10-CM | POA: Diagnosis not present

## 2023-07-20 MED ORDER — PROMETHAZINE-DM 6.25-15 MG/5ML PO SYRP
5.0000 mL | ORAL_SOLUTION | Freq: Four times a day (QID) | ORAL | 0 refills | Status: DC | PRN
Start: 2023-07-20 — End: 2024-02-02

## 2023-07-20 NOTE — Assessment & Plan Note (Signed)
Start taking Promethazine DM 5 mL by mouth every 4 hours as needed for cough and cold symptoms. Increase fluid intake and allow for plenty of rest. Take Tylenol as needed for pain, fever, or general discomfort. Perform warm saltwater gargles 3-4 times daily to help with throat pain or discomfort. (Mix 1/2 teaspoon of salt in a glass of warm water and gargle several times daily to reduce throat inflammation and soothe irritation.) Ginger tea: Reduces throat irritation and can help with nausea. Look for sugar-free or honey-based throat lozenges to help with a sore throat. Use a humidifier at bedtime to help with cough and nasal congestion. For nasal congestion: The use of heated humidified air is a safe and effective therapy. Saline nasal sprays may also help alleviate nasal symptoms of the common cold. For cough: Treatment with honey may reduce cough frequency and severity. Follow up if your symptoms do not improve after 7 to 10 days of symptom onset.

## 2023-07-20 NOTE — Progress Notes (Signed)
Acute Office Visit  Subjective:    Patient ID: [name] [name], female    DOB: [birth year], 77 y.o.   MRN: [id number]  Chief Complaint  Patient presents with   Cough    Pt reports a cough with clear mucus , started 3 days ago    HPI The patient presents today with a cough that started 3 days ago. She denies fever, chills, sore throat, body aches, facial pain and pressure, nausea, vomiting, diarrhea, or generalized malaise. She also denies any recent sick contacts and has not taken any medication to treat the symptoms at home.   Past Medical History:  Diagnosis Date   Acute hypoxemic respiratory failure due to COVID-19 (HCC) 06/29/2020   Anxiety    Aortic stenosis    Arthritis    Biventricular ICD (implantable cardioverter-defibrillator) in place    March 2021, [employer]  -Dr. 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   23 mm Edwards Sapien 3 transcatheter heart valve placed via open right transfemoral approach   Symptomatic PVCs     Past Surgical History:  Procedure Laterality Date   BIV ICD INSERTION CRT-D N/A 10/28/2019   Procedure: BIV ICD INSERTION CRT-D;  Surgeon: Marinus Maw, MD;  Location: [facility] INVASIVE CV LAB;  Service: Cardiovascular;  Laterality: N/A;   CARDIAC CATHETERIZATION     CESAREAN SECTION  1987   FLEXIBLE BRONCHOSCOPY N/A 01/14/2015   Procedure: FLEXIBLE BRONCHOSCOPY;  Surgeon: Loreli Slot, MD;  Location: [facility] OR;  Service: Thoracic;  Laterality: N/A;   LEFT AND RIGHT HEART CATHETERIZATION WITH CORONARY ANGIOGRAM N/A 12/05/2011   Procedure: LEFT AND RIGHT HEART CATHETERIZATION WITH CORONARY ANGIOGRAM;  Surgeon: Kathleene Hazel, MD;  Location: [facility] CATH LAB;  Service: Cardiovascular;  Laterality: N/A;   LEFT AND RIGHT HEART CATHETERIZATION WITH CORONARY ANGIOGRAM N/A 05/23/2014   Procedure: LEFT AND RIGHT HEART CATHETERIZATION WITH CORONARY ANGIOGRAM;  Surgeon: Kathleene Hazel, MD;  Location: [facility] CATH LAB;  Service: Cardiovascular;  Laterality: N/A;   MULTIPLE EXTRACTIONS WITH ALVEOLOPLASTY N/A 02/27/2014   Procedure: Extraction of tooth #'s 2,4,5,6,7,8,9,10,11,12, 22, 23, 24, 25, 26, 28 with alveoloplasty and bilateral mandibular tori reductions;  Surgeon: Charlynne Pander, DDS;  Location: [facility] OR;  Service: Oral Surgery;  Laterality: N/A;   RIGID BRONCHOSCOPY N/A 01/14/2015   Procedure: RIGID BRONCHOSCOPY with Removal Of Foreign Body ;  Surgeon: Loreli Slot, MD;  Location: [facility] OR;  Service: Thoracic;  Laterality: N/A;   TEE WITHOUT CARDIOVERSION N/A 03/24/2015   Procedure: TRANSESOPHAGEAL ECHOCARDIOGRAM (TEE);  Surgeon: Kathleene Hazel, MD;  Location: [facility] OR;  Service: Open Heart Surgery;  Laterality: N/A;   TRANSCATHETER AORTIC VALVE REPLACEMENT, TRANSFEMORAL Bilateral 03/24/2015   Procedure: TRANSCATHETER AORTIC VALVE REPLACEMENT, TRANSFEMORAL;  Surgeon: Kathleene Hazel, MD;  Location: [facility] OR;  Service: Open Heart Surgery;  Laterality:  Bilateral;   TUBAL LIGATION  1987   VIDEO BRONCHOSCOPY Bilateral 06/05/2014   Procedure: VIDEO BRONCHOSCOPY WITHOUT FLUORO;  Surgeon: Lupita Leash, MD;  Location: [facility] ENDOSCOPY;  Service: Cardiopulmonary;  Laterality: Bilateral;    Family History  Problem Relation Age of Onset   Heart disease Mother    Heart attack Mother 67   Diabetes Mother    Hypertension Mother    Colon cancer Father 45   Breast cancer  Sister    Diabetes Brother    Hypertension Brother    Stroke Brother    Cancer Brother    Diabetes Daughter    Congestive Heart Failure Daughter    Hypertension Son    High Cholesterol Son    Congestive Heart Failure Son     Social History   Socioeconomic History   Marital status: Widowed    Spouse name: Not on file   Number of children: 5   Years of education: 11th   Highest education level: Not on file  Occupational History   Occupation: Retired  Tobacco Use   Smoking status: Never   Smokeless tobacco: Never  Vaping Use   Vaping status: Never Used  Substance and Sexual Activity   Alcohol use: No   Drug use: No   Sexual activity: Never    Birth control/protection: None, Post-menopausal  Other Topics Concern   Not on file  Social History Narrative   Not on file   Social Determinants of Health   Financial Resource Strain: Low Risk  (09/09/2022)   Overall Financial Resource Strain (CARDIA)    Difficulty of Paying Living Expenses: Not hard at all  Food Insecurity: No Food Insecurity (09/09/2022)   Hunger Vital Sign    Worried About Running Out of Food in the Last Year: Never true    Ran Out of Food in the Last Year: Never true  Transportation Needs: No Transportation Needs (09/09/2022)   PRAPARE - Administrator, Civil Service (Medical): No    Lack of Transportation (Non-Medical): No  Physical Activity: Sufficiently Active (09/09/2022)   Exercise Vital Sign    Days of Exercise per Week: 5 days    Minutes of Exercise per Session: 30 min  Stress: No Stress Concern Present (09/09/2022)   [employer] of Occupational Health - Occupational Stress Questionnaire    Feeling of Stress : Not at all  Social Connections: Moderately Isolated (09/09/2022)   Social Connection and Isolation Panel [NHANES]    Frequency of Communication with Friends and Family: More than three times a week    Frequency of Social Gatherings with Friends and Family: More than three  times a week    Attends Religious Services: More than 4 times per year    Active Member of [employer] or Organizations: No    Attends Banker Meetings: Never    Marital Status: Widowed  Intimate Partner Violence: Not At Risk (09/09/2022)   Humiliation, Afraid, Rape, and Kick questionnaire    Fear of Current or Ex-Partner: No    Emotionally Abused: No    Physically Abused: No    Sexually Abused: No    Outpatient Medications Prior to Visit  Medication Sig Dispense Refill   acetaminophen (TYLENOL) 500 MG tablet Take one tablet two times daily, by mouth, for knee  pain 60 tablet 3   alendronate (FOSAMAX) 70 MG tablet TAKE 1 TAB EACH WEEK 30 MIN PRIOR TO BREAKFAST WITH LARGE GLASS OF WATER. REMAIN UPRIGHT. 4 tablet 0  aspirin EC 81 MG tablet Take 81 mg by mouth every morning.      blood glucose meter kit and supplies Dispense based on patient and insurance preference. Use to test three times daily (FOR ICD-10 E10.9, E11.9). 1 each 0   carvedilol (COREG) 25 MG tablet TAKE 1 TABLET BY MOUTH TWICE DAILY WITH MEALS. 60 tablet 6   cetirizine (ZYRTEC) 10 MG tablet TAKE ONE TABLET BY MOUTH ONCE DAILY. 30 tablet 0   cholecalciferol (VITAMIN D3) 25 MCG (1000 UNIT) tablet Take 1,000 Units by mouth daily.     colchicine 0.6 MG tablet TAKE 1 TABLET BY MOUTH TWICE DAILY WITH FOOD. 60 tablet 0   diclofenac Sodium (VOLTAREN) 1 % GEL Apply 1 application topically 4 (four) times daily as needed (pain).     ENTRESTO 24-26 MG TAKE 1 TABLET BY MOUTH TWICE DAILY 60 tablet 6   ezetimibe (ZETIA) 10 MG tablet Take 1 tablet (10 mg total) by mouth daily. 90 tablet 0   fenofibrate (TRICOR) 145 MG tablet TAKE (1) TABLET BY MOUTH ONCE DAILY. 30 tablet 0   furosemide (LASIX) 40 MG tablet TAKE (1) TABLET BY MOUTH EACH MORNING. 30 tablet 0   glipiZIDE (GLUCOTROL XL) 2.5 MG 24 hr tablet Take 1 tablet (2.5 mg total) by mouth daily with breakfast. 30 tablet 0   HUMALOG MIX 75/25 KWIKPEN (75-25) 100 UNIT/ML KwikPen  Inject 12 to 15 units subcutaneously twice daily 15 mL 11   Lancets (ONETOUCH DELICA PLUS LANCET33G) MISC USE AS DIRECTED TO TEST BLOOD SUGAR 3 TIMES DAILY. 100 each 0   lidocaine (LIDODERM) 5 % Place 2 patches onto the skin daily. Remove & Discard patch within 12 hours or as directed by MD 30 patch 0   LITETOUCH PEN NEEDLES 31G X 8 MM MISC USE TWICE DAILY AS DIRECTED. 100 each 0   Multiple Vitamins-Minerals (MULTIVITAMIN PO) Take 1 tablet by mouth every morning.      NOVOLOG MIX 70/30 FLEXPEN (70-30) 100 UNIT/ML FlexPen INJECT 12 TO 15 UNITS SUBCUTANEOUSLY TWICE DAILY. 15 mL 0   ONETOUCH ULTRA test strip USE TO CHECK BLOOD SUGAR 3 TIMES DAILY 200 strip 0   Pitavastatin Calcium 2 MG TABS TAKE ONE TABLET BY MOUTH ONCE DAILY AFTER SUPPER. 90 tablet 0   potassium chloride (KLOR-CON) 10 MEQ tablet TAKE ONE TABLET BY MOUTH DAILY. DO NOT TAKE IF NOT TAKING FUROSEMIDE. 30 tablet 0   predniSONE (DELTASONE) 10 MG tablet Take 1 tablet (10 mg total) by mouth 2 (two) times daily with a meal. 10 tablet 0   pregabalin (LYRICA) 25 MG capsule Take 1 capsule (25 mg total) by mouth at bedtime. 30 capsule 3   spironolactone (ALDACTONE) 25 MG tablet TAKE (1/2) TABLET BY MOUTH ONCE DAILY. 45 tablet 2   allopurinol (ZYLOPRIM) 100 MG tablet Take by mouth.     No facility-administered medications prior to visit.    Allergies  Allergen Reactions   Crestor [Rosuvastatin] Other (See Comments)    Muscle aches and elevated CK   Tramadol Nausea Only    States blood sugar elevated also   Penicillins Rash and Other (See Comments)    Family unsure of reaction, but certain mom said she was allergic Did it involve swelling of the face/tongue/throat, SOB, or low BP? No Did it involve sudden or severe rash/hives, skin peeling, or any reaction on the inside of your mouth or nose? Yes Did you need to seek medical attention at a hospital or doctor's office? Yes  When did it last happen?  10 + years     If all above answers are  "NO", may proceed with cephalosporin use.     Review of Systems  Constitutional:  Negative for chills and fever.  HENT:  Positive for congestion. Negative for sinus pressure, sinus pain and sore throat.   Eyes:  Negative for visual disturbance.  Respiratory:  Positive for cough. Negative for chest tightness and shortness of breath.   Neurological:  Negative for dizziness and headaches.       Objective:    Physical Exam HENT:     Head: Normocephalic.     Mouth/Throat:     Mouth: Mucous membranes are moist.  Cardiovascular:     Rate and Rhythm: Normal rate.     Heart sounds: Normal heart sounds.  Pulmonary:     Effort: Pulmonary effort is normal.     Breath sounds: Normal breath sounds.  Neurological:     Mental Status: She is alert.     BP 129/79   Pulse 85   Ht 5\' 6"  (1.676 m)   Wt 186 lb (84.4 kg)   SpO2 96%   BMI 30.02 kg/m  Wt Readings from Last 3 Encounters:  07/20/23 186 lb (84.4 kg)  06/15/23 184 lb 0.6 oz (83.5 kg)  04/20/23 188 lb (85.3 kg)       Assessment & Plan:  Viral illness Assessment & Plan: Start taking Promethazine DM 5 mL by mouth every 4 hours as needed for cough and cold symptoms. Increase fluid intake and allow for plenty of rest. Take Tylenol as needed for pain, fever, or general discomfort. Perform warm saltwater gargles 3-4 times daily to help with throat pain or discomfort. (Mix 1/2 teaspoon of salt in a glass of warm water and gargle several times daily to reduce throat inflammation and soothe irritation.) Ginger tea: Reduces throat irritation and can help with nausea. Look for sugar-free or honey-based throat lozenges to help with a sore throat. Use a humidifier at bedtime to help with cough and nasal congestion. For nasal congestion: The use of heated humidified air is a safe and effective therapy. Saline nasal sprays may also help alleviate nasal symptoms of the common cold. For cough: Treatment with honey may reduce cough frequency  and severity. Follow up if your symptoms do not improve after 7 to 10 days of symptom onset.   Orders: -     Promethazine-DM; Take 5 mLs by mouth 4 (four) times daily as needed.  Dispense: 118 mL; Refill: 0  Note: This chart has been completed using Engineer, civil (consulting) software, and while attempts have been made to ensure accuracy, certain words and phrases may not be transcribed as intended.    Gilmore Laroche, FNP

## 2023-07-20 NOTE — Patient Instructions (Addendum)
I appreciate the opportunity to provide care to you today!   Viral Illness:  Start taking Promethazine DM 5 mL by mouth every 4 hours as needed for cough and cold symptoms. Take the medication as prescribed. Increase fluid intake and allow for plenty of rest. I recommend Tylenol as needed for pain, fever, or general discomfort. Perform warm saltwater gargles 3-4 times daily to help with throat pain or discomfort. Consider using a humidifier at bedtime to help with cough and nasal congestion. Follow up if your symptoms do not improve within 7 to 10 days of symptom onset.  Back Pain with Radiculopathy:  I recommend taking Lyrica 50 mg (2 tablets of 25 mg) at bedtime. I also recommend heat application to your lower back, rest, and avoidance of aggravating activities. You may also apply over-the-counter AdvilTargeted Relief pain-relieving cream to the affected area.    Please continue to a heart-healthy diet and increase your physical activities. Try to exercise for at least five days a week.    It was a pleasure to see you and I look forward to continuing to work together on your health and well-being. Please do not hesitate to call the office if you need care or have questions about your care.  In case of emergency, please visit the Emergency Department for urgent care, or contact our clinic at [phone] to schedule an appointment. We're here to help you!   Have a wonderful day and week. With Gratitude, Gilmore Laroche MSN, FNP-BC

## 2023-07-24 ENCOUNTER — Ambulatory Visit: Payer: PPO

## 2023-07-24 DIAGNOSIS — I428 Other cardiomyopathies: Secondary | ICD-10-CM

## 2023-07-24 DIAGNOSIS — I5020 Unspecified systolic (congestive) heart failure: Secondary | ICD-10-CM

## 2023-07-25 ENCOUNTER — Other Ambulatory Visit: Payer: Self-pay | Admitting: Orthopedic Surgery

## 2023-07-25 DIAGNOSIS — M1A0610 Idiopathic chronic gout, right knee, without tophus (tophi): Secondary | ICD-10-CM

## 2023-07-25 LAB — CUP PACEART REMOTE DEVICE CHECK
Battery Remaining Longevity: 54 mo
Battery Voltage: 2.97 V
Brady Statistic AP VP Percent: 3.83 %
Brady Statistic AP VS Percent: 0.11 %
Brady Statistic AS VP Percent: 94.7 %
Brady Statistic AS VS Percent: 1.36 %
Brady Statistic RA Percent Paced: 3.94 %
Brady Statistic RV Percent Paced: 0.43 %
Date Time Interrogation Session: 20241209043823
HighPow Impedance: 62 Ohm
Implantable Lead Connection Status: 753985
Implantable Lead Connection Status: 753985
Implantable Lead Connection Status: 753985
Implantable Lead Implant Date: 20210315
Implantable Lead Implant Date: 20210315
Implantable Lead Implant Date: 20210315
Implantable Lead Location: 753858
Implantable Lead Location: 753859
Implantable Lead Location: 753860
Implantable Lead Model: 4598
Implantable Lead Model: 5076
Implantable Lead Model: 6935
Implantable Pulse Generator Implant Date: 20210315
Lead Channel Impedance Value: 1026 Ohm
Lead Channel Impedance Value: 188.1 Ohm
Lead Channel Impedance Value: 205.2 Ohm
Lead Channel Impedance Value: 225.849
Lead Channel Impedance Value: 256.667
Lead Channel Impedance Value: 289.597
Lead Channel Impedance Value: 342 Ohm
Lead Channel Impedance Value: 342 Ohm
Lead Channel Impedance Value: 342 Ohm
Lead Channel Impedance Value: 418 Ohm
Lead Channel Impedance Value: 418 Ohm
Lead Channel Impedance Value: 513 Ohm
Lead Channel Impedance Value: 589 Ohm
Lead Channel Impedance Value: 665 Ohm
Lead Channel Impedance Value: 665 Ohm
Lead Channel Impedance Value: 760 Ohm
Lead Channel Impedance Value: 931 Ohm
Lead Channel Impedance Value: 988 Ohm
Lead Channel Pacing Threshold Amplitude: 0.5 V
Lead Channel Pacing Threshold Amplitude: 0.75 V
Lead Channel Pacing Threshold Amplitude: 1 V
Lead Channel Pacing Threshold Pulse Width: 0.4 ms
Lead Channel Pacing Threshold Pulse Width: 0.4 ms
Lead Channel Pacing Threshold Pulse Width: 0.4 ms
Lead Channel Sensing Intrinsic Amplitude: 12.75 mV
Lead Channel Sensing Intrinsic Amplitude: 12.75 mV
Lead Channel Sensing Intrinsic Amplitude: 3.75 mV
Lead Channel Sensing Intrinsic Amplitude: 3.75 mV
Lead Channel Setting Pacing Amplitude: 1.5 V
Lead Channel Setting Pacing Amplitude: 2 V
Lead Channel Setting Pacing Amplitude: 2.25 V
Lead Channel Setting Pacing Pulse Width: 0.4 ms
Lead Channel Setting Pacing Pulse Width: 0.4 ms
Lead Channel Setting Sensing Sensitivity: 0.3 mV
Zone Setting Status: 755011
Zone Setting Status: 755011

## 2023-07-25 NOTE — Telephone Encounter (Signed)
Refill request received via fax for Colchicine

## 2023-07-26 ENCOUNTER — Other Ambulatory Visit: Payer: Self-pay

## 2023-07-26 MED ORDER — GLIPIZIDE ER 2.5 MG PO TB24: 2.5000 mg | ORAL_TABLET | Freq: Every day | ORAL | 1 refills | Status: DC

## 2023-07-26 MED ORDER — FUROSEMIDE 40 MG PO TABS: ORAL_TABLET | ORAL | 1 refills | Status: DC

## 2023-07-26 MED ORDER — COLCHICINE 0.6 MG PO TABS
0.6000 mg | ORAL_TABLET | Freq: Two times a day (BID) | ORAL | 2 refills | Status: DC
Start: 2023-07-26 — End: 2023-12-18

## 2023-07-31 ENCOUNTER — Other Ambulatory Visit: Payer: Self-pay | Admitting: Family Medicine

## 2023-08-14 ENCOUNTER — Other Ambulatory Visit: Payer: Self-pay | Admitting: Family Medicine

## 2023-08-14 ENCOUNTER — Other Ambulatory Visit: Payer: Self-pay

## 2023-08-14 MED ORDER — LITETOUCH PEN NEEDLES 31G X 8 MM MISC: 0 refills | Status: DC

## 2023-08-14 MED ORDER — NOVOLOG MIX 70/30 FLEXPEN (70-30) 100 UNIT/ML ~~LOC~~ SUPN: 12.0000 [IU] | PEN_INJECTOR | Freq: Two times a day (BID) | SUBCUTANEOUS | 0 refills | Status: DC

## 2023-08-14 NOTE — Telephone Encounter (Signed)
Copied from CRM [id number]. Topic: Clinical - Medication Refill >> Aug 14, 2023  9:02 AM Fuller Canada P wrote: Most Recent Primary Care Visit:  Provider: Gilmore Laroche  Department: [facility]  Visit Type: OFFICE VISIT  Date: 07/20/2023  Medication: Insulin Pen Needle (LITETOUCH PEN NEEDLES) 31G X 8 MM MISC, NOVOLOG MIX 70/30 FLEXPEN (70-30) 100 UNIT/ML FlexPen   Has the patient contacted their pharmacy? Yes (Agent: If no, request that the patient contact the pharmacy for the refill. If patient does not wish to contact the pharmacy document the reason why and proceed with request.) (Agent: If yes, when and what did the pharmacy advise?)  Is this the correct pharmacy for this prescription? Yes If no, delete pharmacy and type the correct one.  This is the patient's preferred pharmacy:  [facility] [place], [place] - [address] [place] [address] Phone: [phone] Fax: [phone]   Has the prescription been filled recently? No  Is the patient out of the medication? Yes  Has the patient been seen for an appointment in the last year OR does the patient have an upcoming appointment? Yes  Can we respond through MyChart? No  Agent: Please be advised that Rx refills may take up to 3 business days. We ask that you follow-up with your pharmacy.

## 2023-08-17 ENCOUNTER — Other Ambulatory Visit: Payer: Self-pay | Admitting: Family Medicine

## 2023-08-17 ENCOUNTER — Other Ambulatory Visit: Payer: Self-pay

## 2023-08-17 DIAGNOSIS — I129 Hypertensive chronic kidney disease with stage 1 through stage 4 chronic kidney disease, or unspecified chronic kidney disease: Secondary | ICD-10-CM

## 2023-08-17 MED ORDER — ONETOUCH DELICA PLUS LANCET33G MISC: 0 refills | Status: DC

## 2023-08-17 MED ORDER — POTASSIUM CHLORIDE ER 10 MEQ PO TBCR: EXTENDED_RELEASE_TABLET | ORAL | 0 refills | Status: DC

## 2023-08-17 NOTE — Telephone Encounter (Signed)
 Copied from CRM [id number]. Topic: Clinical - Medication Refill >> Aug 17, 2023  1:04 PM Carlatta H wrote: Most Recent Primary Care Visit:  Provider: MAREN DAS  Department: [facility]  Visit Type: OFFICE VISIT  Date: 07/20/2023  Medication: Lancets [facility] CATHRYNE PLUS [place]) MISC [[id number]]  Has the patient contacted their pharmacy? Yes (Agent: If no, request that the patient contact the pharmacy for the refill. If patient does not wish to contact the pharmacy document the reason why and proceed with request.) (Agent: If yes, when and what did the pharmacy advise?)  Is this the correct pharmacy for this prescription? Yes If no, delete pharmacy and type the correct one.  This is the patient's preferred pharmacy:  [facility] - [place], [place] - [address] [place] [address] Phone: [phone] Fax: [phone]   Has the prescription been filled recently? No  Is the patient out of the medication? Yes  Has the patient been seen for an appointment in the last year OR does the patient have an upcoming appointment? Yes  Can we respond through MyChart? No  Agent: Please be advised that Rx refills may take up to 3 business days. We ask that you follow-up with your pharmacy.

## 2023-08-21 ENCOUNTER — Other Ambulatory Visit: Payer: Self-pay

## 2023-08-21 DIAGNOSIS — E1122 Type 2 diabetes mellitus with diabetic chronic kidney disease: Secondary | ICD-10-CM

## 2023-08-21 MED ORDER — ONETOUCH DELICA PLUS LANCET33G MISC: 0 refills | Status: DC

## 2023-08-23 ENCOUNTER — Ambulatory Visit: Payer: PPO | Admitting: Family Medicine

## 2023-08-24 ENCOUNTER — Other Ambulatory Visit: Payer: Self-pay | Admitting: Family Medicine

## 2023-08-28 ENCOUNTER — Ambulatory Visit: Payer: Self-pay | Admitting: Family Medicine

## 2023-08-28 NOTE — Telephone Encounter (Signed)
  Chief Complaint: Back pain Symptoms: back pain Frequency: months Pertinent Negatives: Patient denies fever, denies GU s/s, denies injury, denies changes in CMS, denies loss of bladder/bowel  Disposition: [] ED /[] Urgent Care (no appt availability in office) / [x] Appointment(In office/virtual)/ []  [facility]/ [] Home Care/ [] Refused Recommended Disposition /[] [facility]/ []  Follow-up with PCP Additional Notes: Pt states she has known arthritis, but is unable to sleep d/t the pain. Pt states tylenol  does not help. Pt given care advice per Epic, Pt will try home remedies, sched with MD.   Copied from CRM [id number]. Topic: Clinical - Pink Word Triage >> Aug 28, 2023  3:42 PM Renea ORN wrote: Back pains Reason for Disposition  [1] MODERATE back pain (e.g., interferes with normal activities) AND [2] present > 3 days  Answer Assessment - Initial Assessment Questions 1. ONSET: When did the pain begin?      months 2. LOCATION: Where does it hurt? (upper, mid or lower back)     Mid and upper back  3. SEVERITY: How bad is the pain?  (e.g., Scale 1-10; mild, moderate, or severe)   - MILD (1-3): Doesn't interfere with normal activities.    - MODERATE (4-7): Interferes with normal activities or awakens from sleep.    - SEVERE (8-10): Excruciating pain, unable to do any normal activities.      8 4. PATTERN: Is the pain constant? (e.g., yes, no; constant, intermittent)      Constant at night 5. RADIATION: Does the pain shoot into your legs or somewhere else?     denies 6. CAUSE:  What do you think is causing the back pain?      arthritis 7. BACK OVERUSE:  Any recent lifting of heavy objects, strenuous work or exercise?     denies 8. MEDICINES: What have you taken so far for the pain? (e.g., nothing, acetaminophen , NSAIDS)     Heating pad, tylenol  doesn't help 9. NEUROLOGIC SYMPTOMS: Do you have any weakness, numbness, or problems with bowel/bladder  control?     denies 10. OTHER SYMPTOMS: Do you have any other symptoms? (e.g., fever, abdomen pain, burning with urination, blood in urine)       denies  Protocols used: Back Pain-A-AH

## 2023-08-29 NOTE — Telephone Encounter (Signed)
 Appt scheduled in am with me

## 2023-08-30 ENCOUNTER — Ambulatory Visit (INDEPENDENT_AMBULATORY_CARE_PROVIDER_SITE_OTHER): Payer: PPO | Admitting: Family Medicine

## 2023-08-30 ENCOUNTER — Encounter: Payer: Self-pay | Admitting: Family Medicine

## 2023-08-30 VITALS — BP 104/73 | HR 80 | Ht 66.0 in | Wt 187.0 lb

## 2023-08-30 DIAGNOSIS — M5410 Radiculopathy, site unspecified: Secondary | ICD-10-CM

## 2023-08-30 DIAGNOSIS — I5042 Chronic combined systolic (congestive) and diastolic (congestive) heart failure: Secondary | ICD-10-CM | POA: Diagnosis not present

## 2023-08-30 DIAGNOSIS — I10 Essential (primary) hypertension: Secondary | ICD-10-CM | POA: Diagnosis not present

## 2023-08-30 DIAGNOSIS — I129 Hypertensive chronic kidney disease with stage 1 through stage 4 chronic kidney disease, or unspecified chronic kidney disease: Secondary | ICD-10-CM

## 2023-08-30 DIAGNOSIS — E66811 Obesity, class 1: Secondary | ICD-10-CM | POA: Diagnosis not present

## 2023-08-30 DIAGNOSIS — E1122 Type 2 diabetes mellitus with diabetic chronic kidney disease: Secondary | ICD-10-CM | POA: Diagnosis not present

## 2023-08-30 DIAGNOSIS — E785 Hyperlipidemia, unspecified: Secondary | ICD-10-CM | POA: Diagnosis not present

## 2023-08-30 DIAGNOSIS — N184 Chronic kidney disease, stage 4 (severe): Secondary | ICD-10-CM

## 2023-08-30 MED ORDER — PREGABALIN 25 MG PO CAPS: ORAL_CAPSULE | ORAL | 1 refills | Status: DC

## 2023-08-30 NOTE — Patient Instructions (Addendum)
 Annual exam 5/26 or after   F/U back pian in 6 to 7 weeks  New higher dose of  is lyrica  25 mg one twice daily for 1 week, then one in the morning and two at bedtime, after 1 month take one in the morning and two at bedtime  continually till you get back in  [place] is an OTC pain patch is also very good   Re order for today,HBA1c ONLY ( just had chem 7 and eGFR with Nephrology)  Foot exam today  Need covid vaccine, TdaP, and shingles vaccines , all are at your pharmacy  I STRONGLY recommend you [facility] to [facility]  Thanks for choosing [facility], we consider it a privelige to serve you.

## 2023-08-31 LAB — HEMOGLOBIN A1C
Est. average glucose Bld gHb Est-mCnc: 160 mg/dL
Hgb A1c MFr Bld: 7.2 % — ABNORMAL HIGH (ref 4.8–5.6)

## 2023-09-04 ENCOUNTER — Ambulatory Visit: Payer: Self-pay | Admitting: Internal Medicine

## 2023-09-05 ENCOUNTER — Other Ambulatory Visit: Payer: Self-pay | Admitting: Family Medicine

## 2023-09-11 NOTE — Assessment & Plan Note (Signed)
Hyperlipidemia:Low fat diet discussed and encouraged.   Lipid Panel  Lab Results  Component Value Date   CHOL 170 05/11/2023   HDL 56 05/11/2023   LDLCALC 94 05/11/2023   TRIG 110 05/11/2023   CHOLHDL 3.0 05/11/2023     Needs to have lower LDL Updated lab needed at/ before next visit.

## 2023-09-11 NOTE — Assessment & Plan Note (Signed)
  Patient re-educated about  the importance of commitment to a  minimum of 150 minutes of exercise per week as able.  The importance of healthy food choices with portion control discussed, as well as eating regularly and within a 12 hour window most days. The need to choose "clean , green" food 50 to 75% of the time is discussed, as well as to make water the primary drink and set a goal of 64 ounces water daily.       08/30/2023    1:07 PM 07/20/2023    8:11 AM 06/15/2023   10:46 AM  Weight /BMI  Weight 187 lb 0.6 oz 186 lb 184 lb 0.6 oz  Height 5\' 6"  (1.676 m) 5\' 6"  (1.676 m) 5\' 6"  (1.676 m)  BMI 30.19 kg/m2 30.02 kg/m2 29.7 kg/m2    Unchanged

## 2023-09-11 NOTE — Assessment & Plan Note (Signed)
Uncontrolled pain, increase dose lyrica gradually and re eval in 6 to 8 weeks

## 2023-09-11 NOTE — Assessment & Plan Note (Signed)
Currently stable , no s/s of decompensation at visit, continue to follow closely with Cardiology

## 2023-09-11 NOTE — Assessment & Plan Note (Signed)
Diabetes associated with hypertension, hyperlipidemia, obesity, CKD, and heart disease  Ms. Ventura is reminded of the importance of commitment to daily physical activity for 30 minutes or more, as able and the need to limit carbohydrate intake to 30 to 60 grams per meal to help with blood sugar control.   The need to take medication as prescribed, test blood sugar as directed, and to call between visits if there is a concern that blood sugar is uncontrolled is also discussed.   Ms. Cornia is reminded of the importance of daily foot exam, annual eye examination, and good blood sugar, blood pressure and cholesterol control.     Latest Ref Rng & Units 08/30/2023    2:02 PM 05/11/2023    9:11 AM 12/14/2022   11:40 AM 07/21/2022   11:27 AM 12/07/2021    3:43 PM  Diabetic Labs  HbA1c 4.8 - 5.6 % 7.2  7.0  7.2  7.3  7.5   Micro/Creat Ratio 0 - 29 mg/g creat   <11   12   Chol 100 - 199 mg/dL  161  096  045  409   HDL >39 mg/dL  56  49  45  52   Calc LDL 0 - 99 mg/dL  94  91  811  65   Triglycerides 0 - 149 mg/dL  914  782  956  213   Creatinine 0.57 - 1.00 mg/dL  0.86  5.78   4.69       08/30/2023    1:07 PM 07/20/2023    8:11 AM 06/15/2023   10:46 AM 05/31/2023   10:12 AM 04/20/2023   11:18 AM 04/13/2023   12:53 PM 12/14/2022   10:49 AM  BP/Weight  Systolic BP 104 129 107 102 110 114 116  Diastolic BP 73 79 57 65 64 58 70  Wt. (Lbs) 187.04 186 184.04  188 189 190.04  BMI 30.19 kg/m2 30.02 kg/m2 29.7 kg/m2  30.34 kg/m2 30.51 kg/m2 30.67 kg/m2      Latest Ref Rng & Units 08/30/2023    1:00 PM 10/05/2022   12:00 AM  Foot/eye exam completion dates  Eye Exam No Retinopathy  No Retinopathy      Foot Form Completion  Done      This result is from an external source.

## 2023-09-11 NOTE — Progress Notes (Signed)
[name] [name]     MRN: [id number]      DOB: March 03, 1946  Chief Complaint  Patient presents with   Back Pain    Back pain worse at night can't sleep    HPI [name] [name] is here for follow up and re-evaluation of chronic medical conditions, medication management and review of any available recent lab and radiology data.  Preventive health is updated, specifically  Cancer screening and Immunization.   Questions or concerns regarding consultations or procedures which the PT has had in the interim are  addressed. The PT denies any adverse reactions to current medications since the last visit.  C/o ongoing uncontrolled back pain which keeps her up at nght, did not refil lyrica , did noit feel it helped her , will do so after the visit as dose is up titrated gradually as tolerated  Denies polyuria, polydipsia, blurred vision , or hypoglycemic episodes. Feels lonely, going irregularly to [facility], enjoys it when she does go , family very supportive Denies polyuria, polydipsia, blurred vision , or hypoglycemic episodes.   ROS Denies recent fever or chills. Denies sinus pressure, nasal congestion, ear pain or sore throat. Denies chest congestion, productive cough or wheezing. Denies chest pains, palpitations and leg swelling Denies abdominal pain, nausea, vomiting,diarrhea or constipation.   Denies dysuria, frequency, hesitancy or incontinence.  Denies headaches, seizures, numbness, or tingling. Denies depression, anxiety or insomnia. Denies skin break down or rash.   PE  BP 104/73 (BP Location: Right Arm, Patient Position: Sitting, Cuff Size: Large)   Pulse 80   Ht 5\' 6"  (1.676 m)   Wt 187 lb 0.6 oz (84.8 kg)   SpO2 98%   BMI 30.19 kg/m   Patient alert and oriented and in no cardiopulmonary distress.Pt in pain  HEENT: No facial asymmetry, EOMI,     Neck supple .  Chest: Clear to auscultation bilaterally.  CVS: S1, S2 systolic murmurs, no S3.Regular rate.  ABD: Soft non  tender.   Ext: No edema  MS: decreased  ROM lumbar spine,  adequate in shoulders, hips and knees.  Skin: Intact, no ulcerations or rash noted.  Psych: Good eye contact, normal affect. Memory intact not anxious or depressed appearing.  CNS: CN 2-12 intact, power,  normal throughout.no focal deficits noted.   Assessment & Plan  Back pain with radiculopathy Uncontrolled pain, increase dose lyrica gradually and re eval in 6 to 8 weeks  Benign essential hypertension Controlled, no change in medication DASH diet and commitment to daily physical activity for a minimum of 30 minutes discussed and encouraged, as a part of hypertension management. The importance of attaining a healthy weight is also discussed.     08/30/2023    1:07 PM 07/20/2023    8:11 AM 06/15/2023   10:46 AM 05/31/2023   10:12 AM 04/20/2023   11:18 AM 04/13/2023   12:53 PM 12/14/2022   10:49 AM  BP/Weight  Systolic BP 104 129 107 102 110 114 116  Diastolic BP 73 79 57 65 64 58 70  Wt. (Lbs) 187.04 186 184.04  188 189 190.04  BMI 30.19 kg/m2 30.02 kg/m2 29.7 kg/m2  30.34 kg/m2 30.51 kg/m2 30.67 kg/m2       Hyperlipidemia LDL goal <70 Hyperlipidemia:Low fat diet discussed and encouraged.   Lipid Panel  Lab Results  Component Value Date   CHOL 170 05/11/2023   HDL 56 05/11/2023   LDLCALC 94 05/11/2023   TRIG 110 05/11/2023   CHOLHDL 3.0  05/11/2023     Needs to have lower LDL Updated lab needed at/ before next visit.   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   The need to take medication as prescribed, test blood sugar as directed, and to call between visits if there is a concern that blood  sugar is uncontrolled is also discussed.   [name] [name] is reminded of the importance of daily foot exam, annual eye examination, and good blood sugar, blood pressure and cholesterol control.     Latest Ref Rng & Units 08/30/2023    2:02 PM 05/11/2023    9:11 AM 12/14/2022   11:40 AM 07/21/2022   11:27 AM 12/07/2021    3:43 PM  Diabetic Labs  HbA1c 4.8 - 5.6 % 7.2  7.0  7.2  7.3  7.5   Micro/Creat Ratio 0 - 29 mg/g creat   <11   12   Chol 100 - 199 mg/dL  409  811  914  782   HDL >39 mg/dL  56  49  45  52   Calc LDL 0 - 99 mg/dL  94  91  956  65   Triglycerides 0 - 149 mg/dL  213  086  578  469   Creatinine 0.57 - 1.00 mg/dL  6.29  5.28   4.13       08/30/2023    1:07 PM 07/20/2023    8:11 AM 06/15/2023   10:46 AM 05/31/2023   10:12 AM 04/20/2023   11:18 AM 04/13/2023   12:53 PM 12/14/2022   10:49 AM  BP/Weight  Systolic BP 104 129 107 102 110 114 116  Diastolic BP 73 79 57 65 64 58 70  Wt. (Lbs) 187.04 186 184.04  188 189 190.04  BMI 30.19 kg/m2 30.02 kg/m2 29.7 kg/m2  30.34 kg/m2 30.51 kg/m2 30.67 kg/m2      Latest Ref Rng & Units 08/30/2023    1:00 PM 10/05/2022   12:00 AM  Foot/eye exam completion dates  Eye Exam No Retinopathy  No Retinopathy      Foot Form Completion  Done      This result is from an external source.        Obesity (BMI 30.0-34.9)  Patient re-educated about  the importance of commitment to a  minimum of 150 minutes of exercise per week as able.  The importance of healthy food choices with portion control discussed, as well as eating regularly and within a 12 hour window most days. The need to choose "clean , green" food 50 to 75% of the time is discussed, as well as to make water the primary drink and set a goal of 64 ounces water daily.       08/30/2023    1:07 PM 07/20/2023    8:11 AM 06/15/2023   10:46 AM  Weight /BMI  Weight 187 lb 0.6 oz 186 lb 184 lb 0.6 oz  Height 5\' 6"  (1.676 m) 5\' 6"  (1.676 m) 5\' 6"  (1.676 m)  BMI 30.19 kg/m2 30.02  kg/m2 29.7 kg/m2    Unchanged  Chronic combined systolic and diastolic heart failure (HCC) Currently stable , no s/s of decompensation at visit,  continue to follow closely with Cardiology  CKD (chronic kidney disease), stage IV (HCC) Stable , managed by Nephrology

## 2023-09-11 NOTE — Assessment & Plan Note (Signed)
Controlled, no change in medication DASH diet and commitment to daily physical activity for a minimum of 30 minutes discussed and encouraged, as a part of hypertension management. The importance of attaining a healthy weight is also discussed.     08/30/2023    1:07 PM 07/20/2023    8:11 AM 06/15/2023   10:46 AM 05/31/2023   10:12 AM 04/20/2023   11:18 AM 04/13/2023   12:53 PM 12/14/2022   10:49 AM  BP/Weight  Systolic BP 104 129 107 102 110 114 116  Diastolic BP 73 79 57 65 64 58 70  Wt. (Lbs) 187.04 186 184.04  188 189 190.04  BMI 30.19 kg/m2 30.02 kg/m2 29.7 kg/m2  30.34 kg/m2 30.51 kg/m2 30.67 kg/m2

## 2023-09-11 NOTE — Assessment & Plan Note (Signed)
Stable , managed by Nephrology

## 2023-09-12 ENCOUNTER — Other Ambulatory Visit: Payer: Self-pay | Admitting: Family Medicine

## 2023-09-13 ENCOUNTER — Ambulatory Visit: Payer: PPO | Admitting: Family Medicine

## 2023-09-19 ENCOUNTER — Other Ambulatory Visit: Payer: Self-pay | Admitting: Family Medicine

## 2023-09-19 ENCOUNTER — Ambulatory Visit (INDEPENDENT_AMBULATORY_CARE_PROVIDER_SITE_OTHER): Payer: PPO

## 2023-09-19 VITALS — Ht 66.0 in | Wt 187.0 lb

## 2023-09-19 DIAGNOSIS — Z0000 Encounter for general adult medical examination without abnormal findings: Secondary | ICD-10-CM | POA: Diagnosis not present

## 2023-09-19 DIAGNOSIS — M8000XA Age-related osteoporosis with current pathological fracture, unspecified site, initial encounter for fracture: Secondary | ICD-10-CM

## 2023-09-19 NOTE — Patient Instructions (Addendum)
 Ms. Turrubiates , Thank you for taking time to come for your Medicare Wellness Visit. I appreciate your ongoing commitment to your health goals. Please review the following plan we discussed and let me know if I can assist you in the future.   Referrals/Orders/Follow-Ups/Clinician Recommendations: Aim for 30 minutes of exercise or brisk walking, 6-8 glasses of water , and 5 servings of fruits and vegetables each day. Follow up with Podiatrist and Ophthalmologist in the upcoming months for routine exam.  This is a list of the screening recommended for you and due dates:  Health Maintenance  Topic Date Due   COVID-19 Vaccine (1) Never done   Zoster (Shingles) Vaccine (1 of 2) Never done   DTaP/Tdap/Td vaccine (3 - Td or Tdap) 06/29/2021   Eye exam for diabetics  10/06/2023   Yearly kidney health urinalysis for diabetes  12/14/2023   Hemoglobin A1C  02/27/2024   Yearly kidney function blood test for diabetes  05/10/2024   Complete foot exam   09/10/2024   Medicare Annual Wellness Visit  09/18/2024   Pneumonia Vaccine  Completed   Flu Shot  Completed   DEXA scan (bone density measurement)  Completed   Hepatitis C Screening  Completed   HPV Vaccine  Aged Out    Advanced directives: (Declined) Advance directive discussed with you today. Even though you declined this today, please call our office should you change your mind, and we can give you the proper paperwork for you to fill out.  Next Medicare Annual Wellness Visit scheduled for next year: Yes - 09/25/2024

## 2023-09-19 NOTE — Progress Notes (Signed)
 Subjective:   [name] [name] is a 78 y.o. female who presents for Medicare Annual (Subsequent) preventive examination.  Visit Complete: Virtual I connected with  Ronal JINNY Minerva on 09/19/23 by a audio enabled telemedicine application and verified that I am speaking with the correct person using two identifiers.  Patient Location: Home  Provider Location: Office/Clinic  I discussed the limitations of evaluation and management by telemedicine. The patient expressed understanding and agreed to proceed.  Vital Signs: Because this visit was a virtual/telehealth visit, some criteria may be missing or patient reported. Any vitals not documented were not able to be obtained and vitals that have been documented are patient reported.  Cardiac Risk Factors include: advanced age (>78men, >53 women);diabetes mellitus;hypertension;dyslipidemia;obesity (BMI >30kg/m2)     Objective:    Today's Vitals   09/19/23 1035  Weight: 187 lb (84.8 kg)  Height: 5' 6 (1.676 m)   Body mass index is 30.18 kg/m.     09/19/2023   10:31 AM 09/09/2022    2:17 PM 09/02/2021    8:46 AM 02/13/2021   11:43 AM 02/05/2021    7:26 AM 12/22/2020    7:06 AM 12/12/2020   12:12 PM  Advanced Directives  Does Patient Have a Medical Advance Directive? No No No No No No No  Would patient like information on creating a medical advance directive?  No - Patient declined No - Patient declined No - Patient declined  No - Patient declined     Current Medications (verified) Outpatient Encounter Medications as of 09/19/2023  Medication Sig   acetaminophen  (TYLENOL ) 500 MG tablet Take one tablet two times daily, by mouth, for knee  pain   alendronate  (FOSAMAX ) 70 MG tablet TAKE 1 TAB EACH WEEK 30 MIN PRIOR TO BREAKFAST WITH LARGE GLASS OF WATER . REMAIN UPRIGHT.   aspirin  EC 81 MG tablet Take 81 mg by mouth every morning.    blood glucose meter kit and supplies Dispense based on patient and insurance preference. Use to test three  times daily (FOR ICD-10 E10.9, E11.9).   carvedilol  (COREG ) 25 MG tablet TAKE 1 TABLET BY MOUTH TWICE DAILY WITH MEALS.   cetirizine  (ZYRTEC ) 10 MG tablet TAKE ONE TABLET BY MOUTH ONCE DAILY.   cholecalciferol (VITAMIN D3) 25 MCG (1000 UNIT) tablet Take 1,000 Units by mouth daily.   colchicine  0.6 MG tablet Take 1 tablet (0.6 mg total) by mouth 2 (two) times daily with a meal.   diclofenac  Sodium (VOLTAREN ) 1 % GEL Apply 1 application topically 4 (four) times daily as needed (pain).   ENTRESTO  24-26 MG TAKE 1 TABLET BY MOUTH TWICE DAILY   ezetimibe  (ZETIA ) 10 MG tablet Take 1 tablet (10 mg total) by mouth daily.   fenofibrate  (TRICOR ) 145 MG tablet TAKE (1) TABLET BY MOUTH ONCE DAILY.   furosemide  (LASIX ) 40 MG tablet TAKE (1) TABLET BY MOUTH EACH MORNING.   glipiZIDE  (GLUCOTROL  XL) 2.5 MG 24 hr tablet Take 1 tablet (2.5 mg total) by mouth daily with breakfast.   HUMALOG  MIX 75/25 KWIKPEN (75-25) 100 UNIT/ML KwikPen Inject 12 to 15 units subcutaneously twice daily   Lancets (ONETOUCH DELICA PLUS LANCET33G) MISC USE AS DIRECTED TO TEST BLOOD SUGAR 3 TIMES DAILY.   LITETOUCH PEN NEEDLES 31G X 8 MM MISC USE TWICE DAILY AS DIRECTED.   Multiple Vitamins-Minerals (MULTIVITAMIN PO) Take 1 tablet by mouth every morning.    NOVOLOG  MIX 70/30 FLEXPEN (70-30) 100 UNIT/ML FlexPen Inject 12-15 Units into the skin 2 (two) times  daily with a meal.   ONETOUCH ULTRA test strip USE TO CHECK BLOOD SUGAR 3 TIMES DAILY   Pitavastatin  Calcium  2 MG TABS TAKE ONE TABLET BY MOUTH ONCE DAILY AFTER SUPPER.   potassium chloride  (KLOR-CON ) 10 MEQ tablet TAKE ONE TABLET BY MOUTH DAILY. DO NOT TAKE IF NOT TAKING FUROSEMIDE .   pregabalin  (LYRICA ) 25 MG capsule Take one capsule twice daily for one week, then take one capsule in the morning and two capsules at bedtime   promethazine -dextromethorphan (PROMETHAZINE -DM) 6.25-15 MG/5ML syrup Take 5 mLs by mouth 4 (four) times daily as needed.   spironolactone  (ALDACTONE ) 25 MG  tablet TAKE (1/2) TABLET BY MOUTH ONCE DAILY.   allopurinol (ZYLOPRIM) 100 MG tablet Take by mouth.   [DISCONTINUED] ferrous sulfate  325 (65 FE) MG EC tablet Take 1 tablet (325 mg total) by mouth 2 (two) times daily.   [DISCONTINUED] FLUoxetine  (PROZAC ) 10 MG tablet Take 1 tablet (10 mg total) by mouth daily. Take one capsule by mouth once a day   No facility-administered encounter medications on file as of 09/19/2023.    Allergies (verified) Crestor  [rosuvastatin ], Tramadol , and Penicillins   History: Past Medical History:  Diagnosis Date   Acute hypoxemic respiratory failure due to COVID-19 (HCC) 06/29/2020   Anxiety    Aortic stenosis    Arthritis    Biventricular ICD (implantable cardioverter-defibrillator) in place    March 2021, [employer]  -Dr. 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   23 mm Edwards Sapien 3 transcatheter heart valve placed via open right transfemoral approach   Symptomatic PVCs    Past Surgical History:  Procedure Laterality Date   BIV ICD INSERTION CRT-D N/A 10/28/2019   Procedure: BIV ICD INSERTION CRT-D;  Surgeon: Waddell Danelle ORN, MD;  Location: [facility] INVASIVE CV LAB;  Service: Cardiovascular;  Laterality: N/A;   CARDIAC CATHETERIZATION     CESAREAN SECTION  1987   FLEXIBLE BRONCHOSCOPY N/A 01/14/2015   Procedure: FLEXIBLE BRONCHOSCOPY;  Surgeon: Elspeth JAYSON Millers, MD;  Location: [facility] OR;  Service:  Thoracic;  Laterality: N/A;   LEFT AND RIGHT HEART CATHETERIZATION WITH CORONARY ANGIOGRAM N/A 12/05/2011   Procedure: LEFT AND RIGHT HEART CATHETERIZATION WITH CORONARY ANGIOGRAM;  Surgeon: Lonni JONETTA Cash, MD;  Location: [facility] CATH LAB;  Service: Cardiovascular;  Laterality: N/A;   LEFT AND RIGHT HEART CATHETERIZATION WITH CORONARY ANGIOGRAM N/A 05/23/2014   Procedure: LEFT AND RIGHT HEART CATHETERIZATION WITH CORONARY ANGIOGRAM;  Surgeon: Lonni JONETTA Cash, MD;  Location: [facility] CATH LAB;  Service: Cardiovascular;  Laterality: N/A;   MULTIPLE EXTRACTIONS WITH ALVEOLOPLASTY N/A 02/27/2014   Procedure: Extraction of tooth #'s 2,4,5,6,7,8,9,10,11,12, 22, 23, 24, 25, 26, 28 with alveoloplasty and bilateral mandibular tori reductions;  Surgeon: Tanda JULIANNA Fanny, DDS;  Location: [facility] OR;  Service: Oral Surgery;  Laterality: N/A;   RIGID BRONCHOSCOPY N/A 01/14/2015  Procedure: RIGID BRONCHOSCOPY with Removal Of Foreign Body ;  Surgeon: Elspeth JAYSON Millers, MD;  Location: [facility] OR;  Service: Thoracic;  Laterality: N/A;   TEE WITHOUT CARDIOVERSION N/A 03/24/2015   Procedure: TRANSESOPHAGEAL ECHOCARDIOGRAM (TEE);  Surgeon: Lonni JONETTA Cash, MD;  Location: [facility] OR;  Service: Open Heart Surgery;  Laterality: N/A;   TRANSCATHETER AORTIC VALVE REPLACEMENT, TRANSFEMORAL Bilateral 03/24/2015   Procedure: TRANSCATHETER AORTIC VALVE REPLACEMENT, TRANSFEMORAL;  Surgeon: Lonni JONETTA Cash, MD;  Location: [facility] OR;  Service: Open Heart Surgery;  Laterality: Bilateral;   TUBAL LIGATION  1987   VIDEO BRONCHOSCOPY Bilateral 06/05/2014   Procedure: VIDEO BRONCHOSCOPY WITHOUT FLUORO;  Surgeon: Vicenta KATHEE Lennert, MD;  Location: [facility] ENDOSCOPY;  Service: Cardiopulmonary;  Laterality: Bilateral;   Family History  Problem Relation Age of Onset   Heart disease Mother    Heart attack Mother 28   Diabetes Mother    Hypertension Mother    Colon cancer Father 67   Breast cancer Sister    Diabetes Brother    Hypertension  Brother    Stroke Brother    Cancer Brother    Diabetes Daughter    Congestive Heart Failure Daughter    Hypertension Son    High Cholesterol Son    Congestive Heart Failure Son    Social History   Socioeconomic History   Marital status: Widowed    Spouse name: Not on file   Number of children: 5   Years of education: 11th   Highest education level: Not on file  Occupational History   Occupation: Retired  Tobacco Use   Smoking status: Never   Smokeless tobacco: Never  Vaping Use   Vaping status: Never Used  Substance and Sexual Activity   Alcohol  use: No   Drug use: No   Sexual activity: Never    Birth control/protection: None, Post-menopausal  Other Topics Concern   Not on file  Social History Narrative   Not on file   Social Drivers of Health   Financial Resource Strain: Low Risk  (09/19/2023)   Overall Financial Resource Strain (CARDIA)    Difficulty of Paying Living Expenses: Not hard at all  Food Insecurity: No Food Insecurity (09/19/2023)   Hunger Vital Sign    Worried About Running Out of Food in the Last Year: Never true    Ran Out of Food in the Last Year: Never true  Transportation Needs: No Transportation Needs (09/19/2023)   PRAPARE - Administrator, Civil Service (Medical): No    Lack of Transportation (Non-Medical): No  Physical Activity: Insufficiently Active (09/19/2023)   Exercise Vital Sign    Days of Exercise per Week: 7 days    Minutes of Exercise per Session: 20 min  Stress: No Stress Concern Present (09/19/2023)   [employer] of Occupational Health - Occupational Stress Questionnaire    Feeling of Stress : Not at all  Social Connections: Moderately Isolated (09/19/2023)   Social Connection and Isolation Panel [NHANES]    Frequency of Communication with Friends and Family: More than three times a week    Frequency of Social Gatherings with Friends and Family: Twice a week    Attends Religious Services: More than 4 times per year     Active Member of [employer] or Organizations: No    Attends Banker Meetings: Never    Marital Status: Widowed    Tobacco Counseling Counseling given: Not Answered   Clinical Intake:  Pre-visit preparation completed: Yes  Pain :  No/denies pain     BMI - recorded: 30.18 Nutritional Status: BMI > 30  Obese Diabetes: Yes CBG done?: Yes (per pt on 09/18/23 results - 153 in the evening) CBG resulted in Enter/ Edit results?: No Did pt. bring in CBG monitor from home?: No  How often do you need to have someone help you when you read instructions, pamphlets, or other written materials from your doctor or pharmacy?: 1 - Never  Interpreter Needed?: No  Information entered by :: Verdie Saba, CMA   Activities of Daily Living    09/19/2023   10:40 AM  In your present state of health, do you have any difficulty performing the following activities:  Hearing? 0  Vision? 0  Difficulty concentrating or making decisions? 0  Walking or climbing stairs? 0  Dressing or bathing? 0  Doing errands, shopping? 0  Preparing Food and eating ? N  Using the Toilet? N  In the past six months, have you accidently leaked urine? N  Do you have problems with loss of bowel control? N  Managing your Medications? N  Managing your Finances? N  Housekeeping or managing your Housekeeping? N    Patient Care Team: Antonetta Rollene BRAVO, MD as PCP - General Debera Jayson MATSU, MD as PCP - Cardiology (Cardiology) Waddell Danelle ORN, MD (Cardiology)  Indicate any recent Medical Services you may have received from other than [facility] providers in the past year (date may be approximate).     Assessment:   This is a routine wellness examination for [facility].  Hearing/Vision screen Hearing Screening - Comments:: Denies hearing difficulties   Vision Screening - Comments:: Wears rx glasses - up to date with routine eye exams with  [facility]   Goals Addressed               This Visit's Progress      Patient Stated (pt-stated)        Patient stated that she's wanting to exercise more - walking longer in time       Depression Screen    09/19/2023   10:48 AM 08/30/2023    1:08 PM 07/20/2023    8:17 AM 06/15/2023   10:49 AM 04/20/2023   11:22 AM 12/14/2022   10:52 AM 09/09/2022    2:16 PM  PHQ 2/9 Scores  PHQ - 2 Score 0 0 0 1 2 0 1  PHQ- 9 Score   0 2 3 1      Fall Risk    09/19/2023   10:41 AM 08/30/2023    1:08 PM 07/20/2023    8:17 AM 06/15/2023   10:49 AM 04/20/2023   11:22 AM  Fall Risk   Falls in the past year? 0 0 0 0 0  Number falls in past yr: 0 0 0 0   Injury with Fall? 0 0 0 0   Risk for fall due to : No Fall Risks No Fall Risks No Fall Risks No Fall Risks   Follow up Falls prevention discussed Falls evaluation completed Falls evaluation completed Falls evaluation completed     MEDICARE RISK AT HOME: Medicare Risk at Home Any stairs in or around the home?: No If so, are there any without handrails?: No Home free of loose throw rugs in walkways, pet beds, electrical cords, etc?: Yes Adequate lighting in your home to reduce risk of falls?: Yes Life alert?: No Use of a cane, walker or w/c?: Yes (cane) Grab bars in the bathroom?: Yes Shower chair  or bench in shower?: Yes Elevated toilet seat or a handicapped toilet?: No  TIMED UP AND GO:  Was the test performed?  No    Cognitive Function:        09/19/2023   10:42 AM 09/09/2022    2:18 PM 09/02/2021    8:53 AM 09/01/2020   11:11 AM 02/06/2019    8:32 AM  6CIT Screen  What Year? 0 points 0 points 0 points 0 points 0 points  What month? 0 points 0 points 0 points 0 points 0 points  What time? 0 points 0 points 0 points 0 points 0 points  Count back from 20 0 points 0 points 0 points 0 points 0 points  Months in reverse 0 points 0 points 0 points 0 points 0 points  Repeat phrase 0 points 0 points 0 points 0 points 2 points  Total Score 0 points 0 points 0 points 0 points 2 points    Immunizations Immunization  History  Administered Date(s) Administered   Fluad Quad(high Dose 65+) 05/13/2019, 07/07/2021, 05/04/2022   Fluad Trivalent(High Dose 65+) 06/15/2023   Influenza Nasal 05/28/2021   Influenza Split 06/30/2011   Influenza Whole 09/18/2007, 05/28/2009, 06/15/2010   Influenza, High Dose Seasonal PF 06/27/2018   Influenza,inj,Quad PF,6+ Mos 05/21/2013, 04/23/2014, 08/18/2015, 06/23/2016, 07/31/2017, 05/11/2020, 07/07/2021   Influenza-Unspecified 04/15/2014, 05/28/2021   Pneumococcal Conjugate-13 09/16/2014   Pneumococcal Polysaccharide-23 05/24/2004, 12/20/2010, 05/28/2021   Td 05/24/2004   Tdap 06/30/2011    TDAP status: Due, Education has been provided regarding the importance of this vaccine. Advised may receive this vaccine at local pharmacy or Health Dept. Aware to provide a copy of the vaccination record if obtained from local pharmacy or Health Dept. Verbalized acceptance and understanding.  Flu Vaccine status: Up to date - 06/15/2023  Pneumococcal vaccine status: Up to date-05/28/2021  Covid-19 vaccine status: Declined, Education has been provided regarding the importance of this vaccine but patient still declined. Advised may receive this vaccine at local pharmacy or Health Dept.or vaccine clinic. Aware to provide a copy of the vaccination record if obtained from local pharmacy or Health Dept. Verbalized acceptance and understanding.  Qualifies for Shingles Vaccine? Yes   Zostavax completed No   Shingrix Completed?: No.    Education has been provided regarding the importance of this vaccine. Patient has been advised to call insurance company to determine out of pocket expense if they have not yet received this vaccine. Advised may also receive vaccine at local pharmacy or Health Dept. Verbalized acceptance and understanding.  Screening Tests Health Maintenance  Topic Date Due   COVID-19 Vaccine (1) Never done   Zoster Vaccines- Shingrix (1 of 2) Never done   DTaP/Tdap/Td (3 - Td  or Tdap) 06/29/2021   OPHTHALMOLOGY EXAM  10/06/2023   Diabetic kidney evaluation - Urine ACR  12/14/2023   HEMOGLOBIN A1C  02/27/2024   Diabetic kidney evaluation - eGFR measurement  05/10/2024   FOOT EXAM  09/10/2024   Medicare Annual Wellness (AWV)  09/18/2024   Pneumonia Vaccine 32+ Years old  Completed   INFLUENZA VACCINE  Completed   DEXA SCAN  Completed   Hepatitis C Screening  Completed   HPV VACCINES  Aged Out    Health Maintenance  Health Maintenance Due  Topic Date Due   COVID-19 Vaccine (1) Never done   Zoster Vaccines- Shingrix (1 of 2) Never done   DTaP/Tdap/Td (3 - Td or Tdap) 06/29/2021    Colorectal cancer screening: No longer required.  Mammogram status: No longer required due to age 36.  Bone Density status: Ordered 09/19/23. Pt provided with contact info and advised to call to schedule appt. Last completed on 12/21/2021.   Additional Screening:  Hepatitis C Screening: does qualify; Completed 10/07/2015  Vision Screening: Recommended annual ophthalmology exams for early detection of glaucoma and other disorders of the eye. Is the patient up to date with their annual eye exam?  Yes  Who is the provider or what is the name of the office in which the patient attends annual eye exams? [facility] If pt is not established with a provider, would they like to be referred to a provider to establish care? No .   Dental Screening: Recommended annual dental exams for proper oral hygiene  Diabetic Foot Exam: Pt is due for foot exam and plans to see Podiatrist next month.    Community Resource Referral / Chronic Care Management: CRR required this visit?  No   CCM required this visit?  No     Plan:     I have personally reviewed and noted the following in the patient's chart:   Medical and social history Use of alcohol , tobacco or illicit drugs  Current medications and supplements including opioid prescriptions. Patient is not currently taking opioid  prescriptions. Functional ability and status Nutritional status Physical activity Advanced directives List of other physicians Hospitalizations, surgeries, and ER visits in previous 12 months Vitals Screenings to include cognitive, depression, and falls Referrals and appointments  In addition, I have reviewed and discussed with patient certain preventive protocols, quality metrics, and best practice recommendations. A written personalized care plan for preventive services as well as general preventive health recommendations were provided to patient.     Verdie CHRISTELLA Saba, CMA   09/19/2023   After Visit Summary: (Mail) Due to this being a telephonic visit, the after visit summary with patients personalized plan was offered to patient via mail   Nurse Notes: none

## 2023-09-27 ENCOUNTER — Ambulatory Visit: Payer: Self-pay | Admitting: Family Medicine

## 2023-09-27 NOTE — Telephone Encounter (Signed)
  Chief Complaint: Cold symptoms Symptoms: congestion, sneezing, small cough Frequency: started last night Pertinent Negatives: Patient denies fever, SOB Disposition: [] ED /[] Urgent Care (no appt availability in office) / [] Appointment(In office/virtual)/ []  [place]/ [] Home Care/ [] Refused Recommended Disposition /[] [place] [facility]/ [x]  Follow-up with PCP Additional Notes: patient called with cold symptoms and asking if there was anything else that she could take to help treat her cold. Patient endorses symptoms of congestion, sneezing and small cough. Patient endorses symptoms having started last night. Patient reports take Tussin which is what she was instructed to to by PCP prior. Patient denies fever or SOB. Patient is in need of guidance from PCP and requesting a phone call. Patient verbalized understanding of plan and all questions answered.    Copied From CRM [id number]. Reason for Triage: The patient has what she believes is a cold. She has been congested, sneezing, her nose has been running and she had a small cough. Her call back number is [phone].    Reason for Disposition  Care advice for mild cough, questions about  Answer Assessment - Initial Assessment Questions 1. ONSET: "When did the nasal discharge start?"      Started yesterday evening 2. AMOUNT: "How much discharge is there?"      A good amount 3. COUGH: "Do you have a cough?" If Yes, ask: "Describe the color of your sputum" (clear, white, yellow, green)     Yes-small amounts of clear sputum 4. RESPIRATORY DISTRESS: "Describe your breathing."      Normal breathing pattern 5. FEVER: "Do you have a fever?" If Yes, ask: "What is your temperature, how was it measured, and when did it start?"     No 6. SEVERITY: "Overall, how bad are you feeling right now?" (e.g., doesn't interfere with normal activities, staying home from school/work, staying in bed)      Feeling pretty good 7. OTHER SYMPTOMS: "Do  you have any other symptoms?" (e.g., sore throat, earache, wheezing, vomiting)     No other symptoms  Protocols used: Common Cold-A-AH

## 2023-09-28 ENCOUNTER — Other Ambulatory Visit: Payer: Self-pay | Admitting: Family Medicine

## 2023-09-28 MED ORDER — BENZONATATE 100 MG PO CAPS: 100.0000 mg | ORAL_CAPSULE | Freq: Two times a day (BID) | ORAL | 0 refills | Status: DC | PRN

## 2023-09-28 NOTE — Progress Notes (Signed)
Cough with clear sputum

## 2023-09-29 NOTE — Telephone Encounter (Signed)
Patient advised.

## 2023-10-03 ENCOUNTER — Other Ambulatory Visit: Payer: Self-pay | Admitting: Family Medicine

## 2023-10-07 ENCOUNTER — Emergency Department (HOSPITAL_COMMUNITY)
Admission: EM | Admit: 2023-10-07 | Discharge: 2023-10-07 | Disposition: A | Discharge status: Home / Self Care | Payer: PPO | Attending: Emergency Medicine | Admitting: Emergency Medicine

## 2023-10-07 ENCOUNTER — Other Ambulatory Visit: Payer: Self-pay

## 2023-10-07 ENCOUNTER — Encounter (HOSPITAL_COMMUNITY): Payer: Self-pay

## 2023-10-07 DIAGNOSIS — L03211 Cellulitis of face: Secondary | ICD-10-CM | POA: Insufficient documentation

## 2023-10-07 DIAGNOSIS — R220 Localized swelling, mass and lump, head: Secondary | ICD-10-CM | POA: Diagnosis present

## 2023-10-07 DIAGNOSIS — E119 Type 2 diabetes mellitus without complications: Secondary | ICD-10-CM | POA: Diagnosis not present

## 2023-10-07 DIAGNOSIS — J0100 Acute maxillary sinusitis, unspecified: Secondary | ICD-10-CM | POA: Insufficient documentation

## 2023-10-07 DIAGNOSIS — Z7982 Long term (current) use of aspirin: Secondary | ICD-10-CM | POA: Insufficient documentation

## 2023-10-07 DIAGNOSIS — Z7984 Long term (current) use of oral hypoglycemic drugs: Secondary | ICD-10-CM | POA: Insufficient documentation

## 2023-10-07 MED ORDER — CEFTRIAXONE SODIUM 1 G IJ SOLR
1.0000 g | Freq: Once | INTRAMUSCULAR | Status: AC
  Administered 2023-10-07: 1 g via INTRAMUSCULAR
  Filled 2023-10-07: qty 10

## 2023-10-07 MED ORDER — LEVOFLOXACIN 750 MG PO TABS: 750.0000 mg | ORAL_TABLET | Freq: Every day | ORAL | 0 refills | Status: DC

## 2023-10-07 NOTE — ED Provider Notes (Signed)
 [place] EMERGENCY DEPARTMENT AT [facility] Provider Note   CSN: [id number] Arrival date & time: 10/07/23  1424     History {Add pertinent medical, surgical, social history, OB history to HPI:1} Chief Complaint  Patient presents with   Facial Swelling    [name] [name] is a 78 y.o. female.  Patient with history of diabetes.  Patient states she has had sinusitis for a number days and now swelling to left side of her face   Rash      Home Medications Prior to Admission medications   Medication Sig Start Date End Date Taking? Authorizing Provider  levofloxacin (LEVAQUIN) 750 MG tablet Take 1 tablet (750 mg total) by mouth daily. X 7 days 10/07/23  Yes Bethann Berkshire, MD  acetaminophen (TYLENOL) 500 MG tablet Take one tablet two times daily, by mouth, for knee  pain 07/18/19   Kerri Perches, MD  alendronate (FOSAMAX) 70 MG tablet TAKE 1 TAB EACH WEEK 30 MIN PRIOR TO BREAKFAST WITH LARGE GLASS OF WATER. REMAIN UPRIGHT. 08/14/23   Kerri Perches, MD  allopurinol (ZYLOPRIM) 100 MG tablet Take by mouth. 11/04/20 04/13/23  [provider]  aspirin EC 81 MG tablet Take 81 mg by mouth every morning.     [provider]  benzonatate (TESSALON PERLES) 100 MG capsule Take 1 capsule (100 mg total) by mouth 2 (two) times daily as needed for cough. 09/28/23   Kerri Perches, MD  blood glucose meter kit and supplies Dispense based on patient and insurance preference. Use to test three times daily (FOR ICD-10 E10.9, E11.9). 09/05/22   Kerri Perches, MD  carvedilol (COREG) 25 MG tablet TAKE 1 TABLET BY MOUTH TWICE DAILY WITH MEALS. 06/22/23   Jonelle Sidle, MD  cetirizine (ZYRTEC) 10 MG tablet TAKE ONE TABLET BY MOUTH ONCE DAILY. 12/06/22   Kerri Perches, MD  cholecalciferol (VITAMIN D3) 25 MCG (1000 UNIT) tablet Take 1,000 Units by mouth daily.    [provider]  colchicine 0.6 MG tablet Take 1 tablet (0.6 mg total) by mouth 2  (two) times daily with a meal. 07/26/23   Vickki Hearing, MD  diclofenac Sodium (VOLTAREN) 1 % GEL Apply 1 application topically 4 (four) times daily as needed (pain).    [provider]  ENTRESTO 24-26 MG TAKE 1 TABLET BY MOUTH TWICE DAILY 07/19/23   Jonelle Sidle, MD  ezetimibe (ZETIA) 10 MG tablet TAKE ONE TABLET BY MOUTH EVERY DAY 10/03/23   Kerri Perches, MD  fenofibrate (TRICOR) 145 MG tablet TAKE ONE TABLET BY MOUTH EVERY DAY 10/03/23   Kerri Perches, MD  furosemide (LASIX) 40 MG tablet TAKE (1) TABLET BY MOUTH EACH MORNING. 09/19/23   Kerri Perches, MD  glipiZIDE (GLUCOTROL XL) 2.5 MG 24 hr tablet Take 1 tablet (2.5 mg total) by mouth daily with breakfast. 09/12/23   Kerri Perches, MD  HUMALOG MIX 75/25 KWIKPEN (75-25) 100 UNIT/ML KwikPen Inject 12 to 15 units subcutaneously twice daily 05/05/23   Kerri Perches, MD  Lancets (ONETOUCH DELICA PLUS LANCET33G) MISC USE AS DIRECTED TO TEST BLOOD SUGAR 3 TIMES DAILY. 08/21/23   Kerri Perches, MD  LITETOUCH PEN NEEDLES 31G X 8 MM MISC USE TWICE DAILY AS DIRECTED. 08/14/23   Kerri Perches, MD  Multiple Vitamins-Minerals (MULTIVITAMIN PO) Take 1 tablet by mouth every morning.     [provider]  NOVOLOG MIX 70/30 FLEXPEN (70-30) 100 UNIT/ML FlexPen  Inject 12-15 Units into the skin 2 (two) times daily with a meal. 08/14/23   Kerri Perches, MD  [facility] ULTRA test strip USE TO CHECK BLOOD SUGAR 3 TIMES DAILY 08/24/23   Kerri Perches, MD  Pitavastatin Calcium 2 MG TABS TAKE ONE TABLET BY MOUTH ONCE DAILY AFTER SUPPER. 05/23/23   Kerri Perches, MD  potassium chloride (KLOR-CON) 10 MEQ tablet TAKE ONE TABLET BY MOUTH DAILY. DO NOT TAKE IF NOT TAKING FUROSEMIDE. 08/17/23   Kerri Perches, MD  pregabalin (LYRICA) 25 MG capsule Take one capsule twice daily for one week, then take one capsule in the morning and two capsules at bedtime 08/30/23   Kerri Perches, MD   promethazine-dextromethorphan (PROMETHAZINE-DM) 6.25-15 MG/5ML syrup Take 5 mLs by mouth 4 (four) times daily as needed. 07/20/23   Gilmore Laroche, FNP  spironolactone (ALDACTONE) 25 MG tablet TAKE (1/2) TABLET BY MOUTH ONCE DAILY. 10/27/22   Jonelle Sidle, MD  ferrous sulfate 325 (65 FE) MG EC tablet Take 1 tablet (325 mg total) by mouth 2 (two) times daily. 07/14/11 10/28/11  Kerri Perches, MD  FLUoxetine (PROZAC) 10 MG tablet Take 1 tablet (10 mg total) by mouth daily. Take one capsule by mouth once a day 12/20/10 11/03/11  Kerri Perches, MD      Allergies    Crestor [rosuvastatin], Tramadol, and Penicillins    Review of Systems   Review of Systems  Skin:  Positive for rash.    Physical Exam Updated Vital Signs BP (!) 124/58 (BP Location: Right Arm)   Pulse 90   Temp 99 F (37.2 C) (Oral)   Resp 16   Ht 5\' 6"  (1.676 m)   Wt 84.8 kg   SpO2 98%   BMI 30.18 kg/m  Physical Exam  ED Results / Procedures / Treatments   Labs (all labs ordered are listed, but only abnormal results are displayed) Labs Reviewed - No data to display  EKG None  Radiology No results found.  Procedures Procedures  {Document cardiac monitor, telemetry assessment procedure when appropriate:1}  Medications Ordered in ED Medications  cefTRIAXone (ROCEPHIN) injection 1 g (has no administration in time range)    ED Course/ Medical Decision Making/ A&P   {   Click here for ABCD2, HEART and other calculatorsREFRESH Note before signing :1}                              Medical Decision Making Risk Prescription drug management.   Patient with cellulitis and sinusitis.  She was placed on Levaquin and will follow-up with her PCP this week.  Patient also has an appointment with ophthalmology Tuesday  {Document critical care time when appropriate:1} {Document review of labs and clinical decision tools ie heart score, Chads2Vasc2 etc:1}  {Document your independent review of radiology  images, and any outside records:1} {Document your discussion with family members, caretakers, and with consultants:1} {Document social determinants of health affecting pt's care:1} {Document your decision making why or why not admission, treatments were needed:1} Final Clinical Impression(s) / ED Diagnoses Final diagnoses:  Facial cellulitis  Subacute maxillary sinusitis    Rx / DC Orders ED Discharge Orders          Ordered    levofloxacin (LEVAQUIN) 750 MG tablet  Daily        10/07/23 1509

## 2023-10-07 NOTE — ED Notes (Signed)
 Pt assisted to bathroom

## 2023-10-07 NOTE — ED Triage Notes (Signed)
 Pt c/o left side facial swelling starting about 3 days ago. Left eye area noted to be swollen and down to cheek bone. Pt states she recently has been sick and that the only new medications are Pregablin 25 mg and benzonatate 100 mg. Pt denies recent falls. Pt states having a pressure pain.

## 2023-10-14 ENCOUNTER — Other Ambulatory Visit: Payer: Self-pay | Admitting: Family Medicine

## 2023-10-17 ENCOUNTER — Telehealth (INDEPENDENT_AMBULATORY_CARE_PROVIDER_SITE_OTHER): Payer: PPO | Admitting: Family Medicine

## 2023-10-17 ENCOUNTER — Encounter: Payer: Self-pay | Admitting: Family Medicine

## 2023-10-17 ENCOUNTER — Other Ambulatory Visit: Payer: Self-pay | Admitting: Family Medicine

## 2023-10-17 DIAGNOSIS — M5410 Radiculopathy, site unspecified: Secondary | ICD-10-CM

## 2023-10-17 DIAGNOSIS — G8929 Other chronic pain: Secondary | ICD-10-CM

## 2023-10-17 DIAGNOSIS — M5440 Lumbago with sciatica, unspecified side: Secondary | ICD-10-CM

## 2023-10-18 NOTE — Progress Notes (Signed)
 Unable to contct pt , not treated needs to be seen asap this week

## 2023-10-21 ENCOUNTER — Emergency Department (HOSPITAL_COMMUNITY)
Admission: EM | Admit: 2023-10-21 | Discharge: 2023-10-21 | Disposition: A | Discharge status: Home / Self Care | Attending: Emergency Medicine | Admitting: Emergency Medicine

## 2023-10-21 ENCOUNTER — Encounter (HOSPITAL_COMMUNITY): Payer: Self-pay

## 2023-10-21 ENCOUNTER — Emergency Department (HOSPITAL_COMMUNITY)

## 2023-10-21 ENCOUNTER — Other Ambulatory Visit: Payer: Self-pay

## 2023-10-21 DIAGNOSIS — R918 Other nonspecific abnormal finding of lung field: Secondary | ICD-10-CM | POA: Diagnosis not present

## 2023-10-21 DIAGNOSIS — I509 Heart failure, unspecified: Secondary | ICD-10-CM | POA: Insufficient documentation

## 2023-10-21 DIAGNOSIS — Z794 Long term (current) use of insulin: Secondary | ICD-10-CM | POA: Diagnosis not present

## 2023-10-21 DIAGNOSIS — Z95810 Presence of automatic (implantable) cardiac defibrillator: Secondary | ICD-10-CM | POA: Diagnosis not present

## 2023-10-21 DIAGNOSIS — R531 Weakness: Secondary | ICD-10-CM | POA: Diagnosis not present

## 2023-10-21 DIAGNOSIS — Z7982 Long term (current) use of aspirin: Secondary | ICD-10-CM | POA: Diagnosis not present

## 2023-10-21 DIAGNOSIS — G5701 Lesion of sciatic nerve, right lower limb: Secondary | ICD-10-CM | POA: Insufficient documentation

## 2023-10-21 DIAGNOSIS — G629 Polyneuropathy, unspecified: Secondary | ICD-10-CM | POA: Diagnosis not present

## 2023-10-21 DIAGNOSIS — E119 Type 2 diabetes mellitus without complications: Secondary | ICD-10-CM | POA: Diagnosis not present

## 2023-10-21 DIAGNOSIS — R42 Dizziness and giddiness: Secondary | ICD-10-CM | POA: Diagnosis not present

## 2023-10-21 DIAGNOSIS — R0989 Other specified symptoms and signs involving the circulatory and respiratory systems: Secondary | ICD-10-CM | POA: Diagnosis not present

## 2023-10-21 LAB — CBC WITH DIFFERENTIAL/PLATELET
Abs Immature Granulocytes: 0.03 10*3/uL (ref 0.00–0.07)
Basophils Absolute: 0.1 10*3/uL (ref 0.0–0.1)
Basophils Relative: 2 %
Eosinophils Absolute: 0.1 10*3/uL (ref 0.0–0.5)
Eosinophils Relative: 3 %
HCT: 35.1 % — ABNORMAL LOW (ref 36.0–46.0)
Hemoglobin: 11 g/dL — ABNORMAL LOW (ref 12.0–15.0)
Immature Granulocytes: 1 %
Lymphocytes Relative: 21 %
Lymphs Abs: 1.2 10*3/uL (ref 0.7–4.0)
MCH: 28.6 pg (ref 26.0–34.0)
MCHC: 31.3 g/dL (ref 30.0–36.0)
MCV: 91.2 fL (ref 80.0–100.0)
Monocytes Absolute: 0.6 10*3/uL (ref 0.1–1.0)
Monocytes Relative: 10 %
Neutro Abs: 3.6 10*3/uL (ref 1.7–7.7)
Neutrophils Relative %: 63 %
Platelets: 160 10*3/uL (ref 150–400)
RBC: 3.85 MIL/uL — ABNORMAL LOW (ref 3.87–5.11)
RDW: 17.2 % — ABNORMAL HIGH (ref 11.5–15.5)
WBC: 5.7 10*3/uL (ref 4.0–10.5)
nRBC: 0 % (ref 0.0–0.2)

## 2023-10-21 LAB — URINALYSIS, ROUTINE W REFLEX MICROSCOPIC
Bilirubin Urine: NEGATIVE
Glucose, UA: NEGATIVE mg/dL
Hgb urine dipstick: NEGATIVE
Ketones, ur: NEGATIVE mg/dL
Leukocytes,Ua: NEGATIVE
Nitrite: NEGATIVE
Protein, ur: NEGATIVE mg/dL
Specific Gravity, Urine: 1.005 (ref 1.005–1.030)
pH: 5 (ref 5.0–8.0)

## 2023-10-21 LAB — RESP PANEL BY RT-PCR (RSV, FLU A&B, COVID)  RVPGX2
Influenza A by PCR: NEGATIVE
Influenza B by PCR: NEGATIVE
Resp Syncytial Virus by PCR: NEGATIVE
SARS Coronavirus 2 by RT PCR: NEGATIVE

## 2023-10-21 LAB — BASIC METABOLIC PANEL
Anion gap: 11 (ref 5–15)
BUN: 46 mg/dL — ABNORMAL HIGH (ref 8–23)
CO2: 24 mmol/L (ref 22–32)
Calcium: 9.8 mg/dL (ref 8.9–10.3)
Chloride: 104 mmol/L (ref 98–111)
Creatinine, Ser: 1.75 mg/dL — ABNORMAL HIGH (ref 0.44–1.00)
GFR, Estimated: 29 mL/min — ABNORMAL LOW (ref >60)
Glucose, Bld: 192 mg/dL — ABNORMAL HIGH (ref 70–99)
Potassium: 4 mmol/L (ref 3.5–5.1)
Sodium: 139 mmol/L (ref 135–145)

## 2023-10-21 LAB — BRAIN NATRIURETIC PEPTIDE: B Natriuretic Peptide: 289 pg/mL — ABNORMAL HIGH (ref 0.0–100.0)

## 2023-10-21 MED ORDER — DICLOFENAC SODIUM 50 MG PO TBEC: 50.0000 mg | DELAYED_RELEASE_TABLET | Freq: Two times a day (BID) | ORAL | 0 refills | Status: DC

## 2023-10-21 NOTE — ED Triage Notes (Signed)
 Pt stated that she has been feeling dizzy and having pain from her right foot all the way to her right shoulder for 3-4 days. Pt stated she has been having trouble walking.

## 2023-10-21 NOTE — ED Notes (Addendum)
 Pt stated she has ."back pain all the time. I have arthritis in my back." Stated dizziness has lessened since earlier.

## 2023-10-21 NOTE — ED Notes (Signed)
 ED Provider at bedside.

## 2023-10-21 NOTE — ED Provider Notes (Signed)
 [place] EMERGENCY DEPARTMENT AT [facility] Provider Note   CSN: [id number] Arrival date & time: 10/21/23  1200     History  Chief Complaint  Patient presents with   Dizziness   Right side pain    [name] [name] is a 78 y.o. female.   Dizziness  This patient is a 78 year old female, she has a known history of a valve replacement, she has been diagnosed with heart failure, she has diabetes on insulin as well as oral medications, she presents to the hospital with a couple of months of right leg pain which she has been told is neuropathy from her family doctor but primarily for the last 2 days she has had a feeling of getting lightheaded when she stands.  No vomiting no diarrhea normal appetite no coughing or shortness of breath no chest pain abdominal pain or back pain, no fevers or chills, nobody has been sick around her.  She does have diffuse myalgias.  She has not been around any sick contacts, she has not seen her family doctor for the same complaint.  She does not feel edematous.  She does not have vertigo and denies any other focal neurologic deficits including numbness or weakness of the arms or legs, no changes in speech or vision.    Home Medications Prior to Admission medications   Medication Sig Start Date End Date Taking? Authorizing Provider  diclofenac (VOLTAREN) 50 MG EC tablet Take 1 tablet (50 mg total) by mouth 2 (two) times daily. 10/21/23  Yes Eber Hong, MD  acetaminophen (TYLENOL) 500 MG tablet Take one tablet two times daily, by mouth, for knee  pain 07/18/19   Kerri Perches, MD  alendronate (FOSAMAX) 70 MG tablet TAKE 1 TAB EACH WEEK 30 MIN PRIOR TO BREAKFAST WITH LARGE GLASS OF WATER. REMAIN UPRIGHT. 08/14/23   Kerri Perches, MD  allopurinol (ZYLOPRIM) 100 MG tablet Take by mouth. 11/04/20 04/13/23  [provider]  aspirin EC 81 MG tablet Take 81 mg by mouth every morning.     [provider]  blood glucose meter kit  and supplies Dispense based on patient and insurance preference. Use to test three times daily (FOR ICD-10 E10.9, E11.9). 09/05/22   Kerri Perches, MD  carvedilol (COREG) 25 MG tablet TAKE 1 TABLET BY MOUTH TWICE DAILY WITH MEALS. 06/22/23   Jonelle Sidle, MD  cetirizine (ZYRTEC) 10 MG tablet TAKE ONE TABLET BY MOUTH ONCE DAILY. 12/06/22   Kerri Perches, MD  cholecalciferol (VITAMIN D3) 25 MCG (1000 UNIT) tablet Take 1,000 Units by mouth daily.    [provider]  colchicine 0.6 MG tablet Take 1 tablet (0.6 mg total) by mouth 2 (two) times daily with a meal. 07/26/23   Vickki Hearing, MD  diclofenac Sodium (VOLTAREN) 1 % GEL Apply 1 application topically 4 (four) times daily as needed (pain).    [provider]  ENTRESTO 24-26 MG TAKE 1 TABLET BY MOUTH TWICE DAILY 07/19/23   Jonelle Sidle, MD  ezetimibe (ZETIA) 10 MG tablet TAKE ONE TABLET BY MOUTH EVERY DAY 10/03/23   Kerri Perches, MD  fenofibrate (TRICOR) 145 MG tablet TAKE ONE TABLET BY MOUTH EVERY DAY 10/03/23   Kerri Perches, MD  furosemide (LASIX) 40 MG tablet TAKE (1) TABLET BY MOUTH EACH MORNING. 09/19/23   Kerri Perches, MD  glipiZIDE (GLUCOTROL XL) 2.5 MG 24 hr tablet Take 1 tablet (2.5 mg total) by mouth daily with  breakfast. 09/12/23   Kerri Perches, MD  HUMALOG MIX 75/25 KWIKPEN (75-25) 100 UNIT/ML KwikPen Inject 12 to 15 units subcutaneously twice daily 05/05/23   Kerri Perches, MD  Lancets [facility] DELICA PLUS LANCET33G) MISC USE AS DIRECTED TO TEST BLOOD SUGAR 3 TIMES DAILY. 08/21/23   Kerri Perches, MD  levofloxacin (LEVAQUIN) 750 MG tablet Take 1 tablet (750 mg total) by mouth daily. X 7 days 10/07/23   Bethann Berkshire, MD  [facility] PEN NEEDLES 31G X 8 MM MISC USE TWICE DAILY AS DIRECTED. 08/14/23   Kerri Perches, MD  Multiple Vitamins-Minerals (MULTIVITAMIN PO) Take 1 tablet by mouth every morning.     [provider]  NOVOLOG MIX 70/30 FLEXPEN  (70-30) 100 UNIT/ML FlexPen Inject 12-15 Units into the skin 2 (two) times daily with a meal. 08/14/23   Kerri Perches, MD  [facility] ULTRA test strip USE TO CHECK BLOOD SUGAR 3 TIMES DAILY 08/24/23   Kerri Perches, MD  Pitavastatin Calcium 2 MG TABS TAKE ONE TABLET BY MOUTH ONCE DAILY AFTER SUPPER. 05/23/23   Kerri Perches, MD  potassium chloride (KLOR-CON) 10 MEQ tablet TAKE ONE TABLET BY MOUTH DAILY. DO NOT TAKE IF NOT TAKING FUROSEMIDE. 10/16/23   Kerri Perches, MD  pregabalin (LYRICA) 25 MG capsule Take one capsule twice daily for one week, then take one capsule in the morning and two capsules at bedtime 08/30/23   Kerri Perches, MD  promethazine-dextromethorphan (PROMETHAZINE-DM) 6.25-15 MG/5ML syrup Take 5 mLs by mouth 4 (four) times daily as needed. 07/20/23   Gilmore Laroche, FNP  spironolactone (ALDACTONE) 25 MG tablet TAKE (1/2) TABLET BY MOUTH ONCE DAILY. 10/27/22   Jonelle Sidle, MD  ferrous sulfate 325 (65 FE) MG EC tablet Take 1 tablet (325 mg total) by mouth 2 (two) times daily. 07/14/11 10/28/11  Kerri Perches, MD  FLUoxetine (PROZAC) 10 MG tablet Take 1 tablet (10 mg total) by mouth daily. Take one capsule by mouth once a day 12/20/10 11/03/11  Kerri Perches, MD      Allergies    Crestor [rosuvastatin], Tramadol, and Penicillins    Review of Systems   Review of Systems  Neurological:  Positive for dizziness.  All other systems reviewed and are negative.   Physical Exam Updated Vital Signs BP 114/66   Pulse 75   Temp 98.5 F (36.9 C) (Oral)   Resp 14   Ht 1.676 m (5\' 6" )   Wt 84.8 kg   SpO2 98%   BMI 30.18 kg/m  Physical Exam Vitals and nursing note reviewed.  Constitutional:      General: She is not in acute distress.    Appearance: She is well-developed.  HENT:     Head: Normocephalic and atraumatic.     Mouth/Throat:     Pharynx: No oropharyngeal exudate.  Eyes:     General: No scleral icterus.       Right eye: No  discharge.        Left eye: No discharge.     Conjunctiva/sclera: Conjunctivae normal.     Pupils: Pupils are equal, round, and reactive to light.  Neck:     Thyroid: No thyromegaly.     Vascular: No JVD.  Cardiovascular:     Rate and Rhythm: Normal rate and regular rhythm.     Heart sounds: Normal heart sounds. No murmur heard.    No friction rub. No gallop.  Pulmonary:     Effort: Pulmonary  effort is normal. No respiratory distress.     Breath sounds: Normal breath sounds. No wheezing or rales.  Abdominal:     General: Bowel sounds are normal. There is no distension.     Palpations: Abdomen is soft. There is no mass.     Tenderness: There is no abdominal tenderness.  Musculoskeletal:        General: No tenderness. Normal range of motion.     Cervical back: Normal range of motion and neck supple.     Right lower leg: No edema.     Left lower leg: No edema.  Lymphadenopathy:     Cervical: No cervical adenopathy.  Skin:    General: Skin is warm and dry.     Findings: No erythema or rash.  Neurological:     Mental Status: She is alert.     Coordination: Coordination normal.     Comments: Speech is clear, cranial nerves III through XII are intact, memory is intact, strength is normal in all 4 extremities including grips and strength at the bilateral thighs, knees and ankles to extention and flexion, sensation is intact to light touch and pinprick in all 4 extremities. Coordination as tested by finger-nose-finger is normal, no limb ataxia. Normal gait, normal reflexes at the patellar tendons bilaterally  Psychiatric:        Behavior: Behavior normal.     ED Results / Procedures / Treatments   Labs (all labs ordered are listed, but only abnormal results are displayed) Labs Reviewed  CBC WITH DIFFERENTIAL/PLATELET - Abnormal; Notable for the following components:      Result Value   RBC 3.85 (*)    Hemoglobin 11.0 (*)    HCT 35.1 (*)    RDW 17.2 (*)    All other components  within normal limits  BASIC METABOLIC PANEL - Abnormal; Notable for the following components:   Glucose, Bld 192 (*)    BUN 46 (*)    Creatinine, Ser 1.75 (*)    GFR, Estimated 29 (*)    All other components within normal limits  BRAIN NATRIURETIC PEPTIDE - Abnormal; Notable for the following components:   B Natriuretic Peptide 289.0 (*)    All other components within normal limits  URINALYSIS, ROUTINE W REFLEX MICROSCOPIC - Abnormal; Notable for the following components:   Color, Urine COLORLESS (*)    All other components within normal limits  RESP PANEL BY RT-PCR (RSV, FLU A&B, COVID)  [id number]    EKG EKG Interpretation Date/Time:  Saturday October 21 2023 15:25:19 EST Ventricular Rate:  71 PR Interval:  131 QRS Duration:  166 QT Interval:  415 QTC Calculation: 451 R Axis:   15  Text Interpretation: Sinus rhythm Consider right atrial enlargement IVCD, consider atypical LBBB since last tracing no significant change Confirmed by Eber Hong ([id number]) on 10/21/2023 4:20:24 PM  Radiology DG Chest Port 1 View Result Date: 10/21/2023 CLINICAL DATA:  weak and dizzy, hx of CHf EXAM: PORTABLE CHEST 1 VIEW COMPARISON:  Dec 22, 2020 FINDINGS: The cardiomediastinal silhouette is unchanged and enlarged in contour.LEFT chest AICD. Status post TAVR. Low lung volume radiograph. No large pleural effusion. Similar background of reticular prominence compared to prior. No pneumothorax. No acute pleuroparenchymal abnormality. IMPRESSION: Similar background of reticular prominence compared to prior. This could reflect a component of chronic interstitial changes versus chronic pulmonary edema. Electronically Signed   By: Meda Klinefelter M.D.   On: 10/21/2023 15:42    Procedures Procedures    Medications  Ordered in ED Medications - No data to display  ED Course/ Medical Decision Making/ A&P                                 Medical Decision Making Amount and/or Complexity of Data Reviewed Labs:  ordered. Radiology: ordered. ECG/medicine tests: ordered.    This patient presents to the ED for concern of dizziness, this involves an extensive number of treatment options, and is a complaint that carries with it a high risk of complications and morbidity.  The differential diagnosis includes dehydration, anemia, electrolyte disturbance, arrhythmia, vertigo, she does not have any focal neurologic deficits to suggest that this is something more chronic than that.  She did have a facial cellulitis for which she was seen in the ER a couple of weeks ago and improved significantly.  She is followed closely in the office by her family doctor   Co morbidities that complicate the patient evaluation  Known nonischemic cardiomyopathy status post pacemaker   Additional history obtained:  Additional history obtained from cardiology notes, family doctor notes, no recent admissions to the hospital going back over the last couple of years External records from outside source obtained and reviewed including as above   Lab Tests:  I Ordered, and personally interpreted labs.  The pertinent results include: CBC metabolic panel unremarkable, the creatinine is actually better than it has been in prior values.  Urine sample without signs of infection, COVID and flu negative.   Imaging Studies ordered:  I ordered imaging studies including chest x-ray I independently visualized and interpreted imaging which showed mild interstitial changes, no significant pulmonary edema I agree with the radiologist interpretation   Cardiac Monitoring: / EKG:  The patient was maintained on a cardiac monitor.  I personally viewed and interpreted the cardiac monitored which showed an underlying rhythm of: Paced rhythm    Problem List / ED Course / Critical interventions / Medication management  Patient appears well, she is sitting up on the edge of the bed with a blood pressure of 114/66, heart rate of 75, she is in  no distress and she is not lightheaded.  She is eating and drinking but now endorses that she is not eating and drinking very much at home.  The son has arrived and is agreeable to help her to get better food and fluid, he is encouraging her to move in with them, she does not want to at this time.  She does not appear septic, she does not have any chest pain, I do not think this is acute ischemia, she does not appear to be febrile, she is well-appearing at this time and agreeable to discharge.  She is now more concerned about this pain that she has been having in her leg for the last couple of months radiating from her back down to her toes.  I suspect it is to some degree sciatica.  I discussed steroids and avoiding because of the hyperglycemia with her diabetes, she is agreeable.  I will refer her to spine as an outpatient I have reviewed the patients home medicines and have made adjustments as needed   Social Determinants of Health:  Nonischemic cardiomyopathy   Test / Admission - Considered:  Considered admission but the patient is well-appearing normotensive and has no other complaints at this time have not fully been evaluated in the ED to the best capability of the ED at this  time         Final Clinical Impression(s) / ED Diagnoses Final diagnoses:  Neuropathy, sciatic, right  Lightheadedness    Rx / DC Orders ED Discharge Orders          Ordered    diclofenac (VOLTAREN) 50 MG EC tablet  2 times daily        10/21/23 1629              Eber Hong, MD 10/21/23 1630

## 2023-10-21 NOTE — ED Notes (Signed)
 Pt provided crackers and water. EDP stated she is able to eat now.

## 2023-10-21 NOTE — ED Notes (Signed)
 Pt ambulated to restroom.

## 2023-10-21 NOTE — Discharge Instructions (Signed)
 I have given you the phone number for the spinal doctors in [place].  I would recommend that you call the office on Monday to make a follow-up for this coming week.  In the meantime you can take diclofenac up to every 12 hours, 1 tablet by mouth to help with some of the pain.  Return to the ER immediately for severe worsening symptoms.  I would like for you to see Dr. Lodema Hong this week as well and follow-up.  Make sure that you are drinking plenty of clear liquids to stay hydrated

## 2023-10-23 ENCOUNTER — Other Ambulatory Visit: Payer: Self-pay | Admitting: Family Medicine

## 2023-10-23 ENCOUNTER — Ambulatory Visit: Payer: PPO

## 2023-10-23 DIAGNOSIS — I428 Other cardiomyopathies: Secondary | ICD-10-CM | POA: Diagnosis not present

## 2023-10-23 DIAGNOSIS — I129 Hypertensive chronic kidney disease with stage 1 through stage 4 chronic kidney disease, or unspecified chronic kidney disease: Secondary | ICD-10-CM

## 2023-10-23 DIAGNOSIS — I5020 Unspecified systolic (congestive) heart failure: Secondary | ICD-10-CM

## 2023-10-24 ENCOUNTER — Telehealth: Payer: Self-pay

## 2023-10-24 LAB — CUP PACEART REMOTE DEVICE CHECK
Battery Remaining Longevity: 48 mo
Battery Voltage: 2.97 V
Brady Statistic AP VP Percent: 0.81 %
Brady Statistic AP VS Percent: 0.04 %
Brady Statistic AS VP Percent: 97.8 %
Brady Statistic AS VS Percent: 1.36 %
Brady Statistic RA Percent Paced: 0.84 %
Brady Statistic RV Percent Paced: 0.99 %
Date Time Interrogation Session: 20250310072204
HighPow Impedance: 63 Ohm
Implantable Lead Connection Status: 753985
Implantable Lead Connection Status: 753985
Implantable Lead Connection Status: 753985
Implantable Lead Implant Date: 20210315
Implantable Lead Implant Date: 20210315
Implantable Lead Implant Date: 20210315
Implantable Lead Location: 753858
Implantable Lead Location: 753859
Implantable Lead Location: 753860
Implantable Lead Model: 4598
Implantable Lead Model: 5076
Implantable Lead Model: 6935
Implantable Pulse Generator Implant Date: 20210315
Lead Channel Impedance Value: 1007 Ohm
Lead Channel Impedance Value: 1007 Ohm
Lead Channel Impedance Value: 188.1 Ohm
Lead Channel Impedance Value: 198.837
Lead Channel Impedance Value: 223.615
Lead Channel Impedance Value: 253.786
Lead Channel Impedance Value: 273.729
Lead Channel Impedance Value: 342 Ohm
Lead Channel Impedance Value: 361 Ohm
Lead Channel Impedance Value: 361 Ohm
Lead Channel Impedance Value: 418 Ohm
Lead Channel Impedance Value: 456 Ohm
Lead Channel Impedance Value: 475 Ohm
Lead Channel Impedance Value: 589 Ohm
Lead Channel Impedance Value: 646 Ohm
Lead Channel Impedance Value: 665 Ohm
Lead Channel Impedance Value: 722 Ohm
Lead Channel Impedance Value: 950 Ohm
Lead Channel Pacing Threshold Amplitude: 0.5 V
Lead Channel Pacing Threshold Amplitude: 0.625 V
Lead Channel Pacing Threshold Amplitude: 0.75 V
Lead Channel Pacing Threshold Pulse Width: 0.4 ms
Lead Channel Pacing Threshold Pulse Width: 0.4 ms
Lead Channel Pacing Threshold Pulse Width: 0.4 ms
Lead Channel Sensing Intrinsic Amplitude: 11.875 mV
Lead Channel Sensing Intrinsic Amplitude: 11.875 mV
Lead Channel Sensing Intrinsic Amplitude: 3.875 mV
Lead Channel Sensing Intrinsic Amplitude: 3.875 mV
Lead Channel Setting Pacing Amplitude: 1.5 V
Lead Channel Setting Pacing Amplitude: 2 V
Lead Channel Setting Pacing Amplitude: 2.25 V
Lead Channel Setting Pacing Pulse Width: 0.4 ms
Lead Channel Setting Pacing Pulse Width: 0.4 ms
Lead Channel Setting Sensing Sensitivity: 0.3 mV
Zone Setting Status: 755011
Zone Setting Status: 755011

## 2023-10-24 NOTE — Transitions of Care (Post Inpatient/ED Visit) (Signed)
 10/24/2023  Name: [name] [name] MRN: [id number] DOB: 1945/09/23  Today's TOC FU Call Status: Today's TOC FU Call Status:: Successful TOC FU Call Completed TOC FU Call Complete Date: 10/24/23 Patient's Name and Date of Birth confirmed.  Transition Care Management Follow-up Telephone Call Date of Discharge: 10/21/23 [facility] Type of Discharge: Emergency Department Reason for ED Visit: Other: (lesion sciatic nerve) How have you been since you were released from the hospital?: Better Any questions or concerns?: No  Items Reviewed: Did you receive and understand the discharge instructions provided?: Yes Medications obtained,verified, and reconciled?: Yes (Medications Reviewed) Any new allergies since your discharge?: No Dietary orders reviewed?: Yes Do you have support at home?: No  Medications Reviewed Today: Medications Reviewed Today     Reviewed by Karena Addison, LPN (Licensed Practical Nurse) on 10/24/23 at 1449  Med List Status: <None>   Medication Order Taking? Sig Documenting Provider Last Dose Status Informant  acetaminophen (TYLENOL) 500 MG tablet [id number] No Take one tablet two times daily, by mouth, for knee  pain Kerri Perches, MD Taking Active Self  alendronate (FOSAMAX) 70 MG tablet [id number] No TAKE 1 TAB EACH WEEK 30 MIN PRIOR TO BREAKFAST WITH LARGE GLASS OF WATER. REMAIN UPRIGHT. Kerri Perches, MD Taking Active   allopurinol (ZYLOPRIM) 100 MG tablet [id number] No Take by mouth. [provider] Taking Expired 04/13/23 2359 Self           Med Note (MENDOZA MENDEZ, CARLOS A   Tue Jan 25, 2022  2:56 PM)    aspirin EC 81 MG tablet [id number] No Take 81 mg by mouth every morning.  [provider] Taking Active Self  blood glucose meter kit and supplies [id number] No Dispense based on patient and insurance preference. Use to test three times daily (FOR ICD-10 E10.9, E11.9). Kerri Perches, MD Taking Active    carvedilol (COREG) 25 MG tablet [id number] No TAKE 1 TABLET BY MOUTH TWICE DAILY WITH MEALS. Jonelle Sidle, MD Taking Active   cetirizine (ZYRTEC) 10 MG tablet [id number] No TAKE ONE TABLET BY MOUTH ONCE DAILY. Kerri Perches, MD Taking Active   cholecalciferol (VITAMIN D3) 25 MCG (1000 UNIT) tablet [id number] No Take 1,000 Units by mouth daily. [provider] Taking Active Self           Med Note [facility] MENDEZ, Christiana Pellant   Tue Jan 25, 2022  2:56 PM)    colchicine 0.6 MG tablet [id number] No Take 1 tablet (0.6 mg total) by mouth 2 (two) times daily with a meal. Vickki Hearing, MD Taking Active   diclofenac (VOLTAREN) 50 MG EC tablet [id number]  Take 1 tablet (50 mg total) by mouth 2 (two) times daily. Eber Hong, MD  Active   diclofenac Sodium (VOLTAREN) 1 % GEL [id number] No Apply 1 application topically 4 (four) times daily as needed (pain). [provider] Taking Active Self  ENTRESTO 24-26 [place] [id number] No TAKE 1 TABLET BY MOUTH TWICE DAILY Jonelle Sidle, MD Taking Active   ezetimibe (ZETIA) 10 MG tablet [id number] No TAKE ONE TABLET BY MOUTH EVERY DAY Kerri Perches, MD Taking Active   fenofibrate (TRICOR) 145 MG tablet [id number] No TAKE ONE TABLET BY MOUTH EVERY DAY Kerri Perches, MD Taking Active   Discontinued 10/28/11 1646 (Completed Course)            Med Note Joella Prince A   Tue Oct 22, 2019  1:02 PM)  Discontinued 11/03/11 1009 (Error) furosemide (LASIX) 40 MG tablet [id number] No TAKE (1) TABLET BY MOUTH EACH MORNING. Kerri Perches, MD Taking Active   glipiZIDE (GLUCOTROL XL) 2.5 MG 24 hr tablet [id number] No Take 1 tablet (2.5 mg total) by mouth daily with breakfast. Kerri Perches, MD Taking Active   HUMALOG MIX 75/25 KWIKPEN (75-25) 100 UNIT/ML KwikPen [id number] No Inject 12 to 15 units subcutaneously twice daily Kerri Perches, MD Taking Active   Lancets [facility] DELICA PLUS [place]) MISC [id number]  USE AS DIRECTED  TO TEST BLOOD SUGAR 3 TIMES DAILY. Kerri Perches, MD  Active   levofloxacin [facility]) 750 MG tablet [id number] No Take 1 tablet (750 mg total) by mouth daily. X 7 days Bethann Berkshire, MD Taking Active   [facility] PEN NEEDLES 31G X 8 MM MISC [id number]  USE TWICE DAILY AS DIRECTED. Kerri Perches, MD  Active   Multiple Vitamins-Minerals (MULTIVITAMIN PO) [id number] No Take 1 tablet by mouth every morning.  [provider] Taking Active Self  NOVOLOG MIX 70/30 FLEXPEN (70-30) 100 UNIT/ML FlexPen [id number] No Inject 12-15 Units into the skin 2 (two) times daily with a meal. Kerri Perches, MD Taking Active            Med Note Darreld Mclean   Tue Sep 19, 2023 10:34 AM) On a sliding scale per patient  Koren Bound test strip [id number] No USE TO CHECK BLOOD SUGAR 3 TIMES DAILY Kerri Perches, MD Taking Active   Pitavastatin Calcium 2 MG TABS [id number] No TAKE ONE TABLET BY MOUTH ONCE DAILY AFTER SUPPER. Kerri Perches, MD Taking Active   potassium chloride (KLOR-CON) 10 MEQ tablet [id number] No TAKE ONE TABLET BY MOUTH DAILY. DO NOT TAKE IF NOT TAKING FUROSEMIDE. Kerri Perches, MD Taking Active   pregabalin (LYRICA) 25 MG capsule [id number] No Take one capsule twice daily for one week, then take one capsule in the morning and two capsules at bedtime Kerri Perches, MD Taking Active   promethazine-dextromethorphan (PROMETHAZINE-DM) 6.25-15 MG/5ML syrup [id number] No Take 5 mLs by mouth 4 (four) times daily as needed. Gilmore Laroche, FNP Taking Active   spironolactone (ALDACTONE) 25 MG tablet [id number] No TAKE (1/2) TABLET BY MOUTH ONCE DAILY. Jonelle Sidle, MD Taking Active             Home Care and Equipment/Supplies: Were Home Health Services Ordered?: NA Any new equipment or medical supplies ordered?: NA  Functional Questionnaire: Do you need assistance with bathing/showering or dressing?: No Do you need assistance with meal preparation?:  No Do you need assistance with eating?: No Do you have difficulty maintaining continence: No Do you need assistance with getting out of bed/getting out of a chair/moving?: No Do you have difficulty managing or taking your medications?: No  Follow up appointments reviewed: PCP Follow-up appointment confirmed?: Yes Date of PCP follow-up appointment?: 10/27/23 Follow-up Provider: [facility] Follow-up appointment confirmed?: Yes Date of Specialist follow-up appointment?: 10/30/23 Follow-Up Specialty Provider:: spine Do you need transportation to your follow-up appointment?: No Do you understand care options if your condition(s) worsen?: Yes-patient verbalized understanding    SIGNATURE Karena Addison, LPN [facility] Nurse Health Advisor Direct Dial [phone]

## 2023-10-27 ENCOUNTER — Ambulatory Visit: Payer: Self-pay | Admitting: Family Medicine

## 2023-10-27 VITALS — BP 120/80 | HR 76 | Ht 66.0 in | Wt 185.0 lb

## 2023-10-27 DIAGNOSIS — I10 Essential (primary) hypertension: Secondary | ICD-10-CM | POA: Diagnosis not present

## 2023-10-27 DIAGNOSIS — E1169 Type 2 diabetes mellitus with other specified complication: Secondary | ICD-10-CM | POA: Diagnosis not present

## 2023-10-27 DIAGNOSIS — E1122 Type 2 diabetes mellitus with diabetic chronic kidney disease: Secondary | ICD-10-CM | POA: Diagnosis not present

## 2023-10-27 DIAGNOSIS — N184 Chronic kidney disease, stage 4 (severe): Secondary | ICD-10-CM

## 2023-10-27 DIAGNOSIS — Z09 Encounter for follow-up examination after completed treatment for conditions other than malignant neoplasm: Secondary | ICD-10-CM | POA: Diagnosis not present

## 2023-10-27 DIAGNOSIS — I129 Hypertensive chronic kidney disease with stage 1 through stage 4 chronic kidney disease, or unspecified chronic kidney disease: Secondary | ICD-10-CM | POA: Diagnosis not present

## 2023-10-27 DIAGNOSIS — E785 Hyperlipidemia, unspecified: Secondary | ICD-10-CM | POA: Diagnosis not present

## 2023-10-27 DIAGNOSIS — M5410 Radiculopathy, site unspecified: Secondary | ICD-10-CM | POA: Diagnosis not present

## 2023-10-27 NOTE — Assessment & Plan Note (Signed)
 Diabetes associated with hypertension, hyperlipidemia, and arthritis  [name] is reminded of the importance of commitment to daily physical activity for 30 minutes or more, as able and the need to limit carbohydrate intake to 30 to 60 grams per meal to help with blood sugar control.   The need to take medication as prescribed, test blood sugar as directed, and to call between visits if there is a concern that blood sugar is uncontrolled is also discussed.   [name] is reminded of the importance of daily foot exam, annual eye examination, and good blood sugar, blood pressure and cholesterol control.     Latest Ref Rng & Units 10/21/2023   12:40 PM 08/30/2023    2:02 PM 05/11/2023    9:11 AM 12/14/2022   11:40 AM 07/21/2022   11:27 AM  Diabetic Labs  HbA1c 4.8 - 5.6 %  7.2  7.0  7.2  7.3   Micro/Creat Ratio 0 - 29 mg/g creat    <11    Chol 100 - 199 mg/dL   161  096  045   HDL >40 mg/dL   56  49  45   Calc LDL 0 - 99 mg/dL   94  91  981   Triglycerides 0 - 149 mg/dL   191  478  295   Creatinine 0.44 - 1.00 mg/dL 6.21   3.08  6.57        10/21/2023    4:25 PM 10/21/2023    4:05 PM 10/21/2023    3:05 PM 10/21/2023   12:29 PM 10/21/2023   12:23 PM 10/07/2023    3:41 PM 10/07/2023    2:36 PM  BP/Weight  Systolic BP 114 114 92 101  117   Diastolic BP 66 63 70 49  63   Wt. (Lbs)     186.99  186.99  BMI     30.18 kg/m2  30.18 kg/m2      Latest Ref Rng & Units 08/30/2023    1:00 PM 10/05/2022   12:00 AM  Foot/eye exam completion dates  Eye Exam No Retinopathy  No Retinopathy      Foot Form Completion  Done      This result is from an external source.

## 2023-10-27 NOTE — Patient Instructions (Signed)
 Annual exam with MD end May to June, call if you need me sooner  Labs today  Good  you are getting a specialist to manage yourpain next week.  Be careful not to fall  Thanks for choosing [facility], we consider it a privelige to serve you.

## 2023-10-27 NOTE — Assessment & Plan Note (Signed)
 Hyperlipidemia:Low fat diet discussed and encouraged.   Lipid Panel  Lab Results  Component Value Date   CHOL 170 05/11/2023   HDL 56 05/11/2023   LDLCALC 94 05/11/2023   TRIG 110 05/11/2023   CHOLHDL 3.0 05/11/2023     Updated lab needed at/ before next visit.

## 2023-10-28 ENCOUNTER — Other Ambulatory Visit: Payer: Self-pay | Admitting: Family Medicine

## 2023-10-28 LAB — CMP14+EGFR
ALT: 24 IU/L (ref 0–32)
AST: 30 IU/L (ref 0–40)
Albumin: 4.1 g/dL (ref 3.8–4.8)
Alkaline Phosphatase: 41 IU/L — ABNORMAL LOW (ref 44–121)
BUN/Creatinine Ratio: 27 (ref 12–28)
BUN: 72 mg/dL — ABNORMAL HIGH (ref 8–27)
Bilirubin Total: 0.2 mg/dL (ref 0.0–1.2)
CO2: 22 mmol/L (ref 20–29)
Calcium: 9.3 mg/dL (ref 8.7–10.3)
Chloride: 103 mmol/L (ref 96–106)
Creatinine, Ser: 2.65 mg/dL — ABNORMAL HIGH (ref 0.57–1.00)
Globulin, Total: 2 g/dL (ref 1.5–4.5)
Glucose: 77 mg/dL (ref 70–99)
Potassium: 5.1 mmol/L (ref 3.5–5.2)
Sodium: 141 mmol/L (ref 134–144)
Total Protein: 6.1 g/dL (ref 6.0–8.5)
eGFR: 18 mL/min/{1.73_m2} — ABNORMAL LOW (ref >59)

## 2023-10-28 LAB — HEMOGLOBIN A1C
Est. average glucose Bld gHb Est-mCnc: 154 mg/dL
Hgb A1c MFr Bld: 7 % — ABNORMAL HIGH (ref 4.8–5.6)

## 2023-10-28 LAB — LIPID PANEL W/O CHOL/HDL RATIO
Cholesterol, Total: 146 mg/dL (ref 100–199)
HDL: 43 mg/dL (ref >39)
LDL Chol Calc (NIH): 82 mg/dL (ref 0–99)
Triglycerides: 118 mg/dL (ref 0–149)
VLDL Cholesterol Cal: 21 mg/dL (ref 5–40)

## 2023-10-30 DIAGNOSIS — Z6830 Body mass index (BMI) 30.0-30.9, adult: Secondary | ICD-10-CM | POA: Diagnosis not present

## 2023-10-30 DIAGNOSIS — M4316 Spondylolisthesis, lumbar region: Secondary | ICD-10-CM | POA: Diagnosis not present

## 2023-10-31 ENCOUNTER — Other Ambulatory Visit: Payer: Self-pay | Admitting: Family Medicine

## 2023-11-02 ENCOUNTER — Inpatient Hospital Stay: Payer: Self-pay | Admitting: Family Medicine

## 2023-11-02 ENCOUNTER — Other Ambulatory Visit (HOSPITAL_COMMUNITY): Payer: Self-pay | Admitting: Surgery

## 2023-11-02 DIAGNOSIS — M4316 Spondylolisthesis, lumbar region: Secondary | ICD-10-CM

## 2023-11-03 DIAGNOSIS — E1122 Type 2 diabetes mellitus with diabetic chronic kidney disease: Secondary | ICD-10-CM | POA: Diagnosis not present

## 2023-11-03 DIAGNOSIS — I129 Hypertensive chronic kidney disease with stage 1 through stage 4 chronic kidney disease, or unspecified chronic kidney disease: Secondary | ICD-10-CM | POA: Diagnosis not present

## 2023-11-03 DIAGNOSIS — I5042 Chronic combined systolic (congestive) and diastolic (congestive) heart failure: Secondary | ICD-10-CM | POA: Diagnosis not present

## 2023-11-03 DIAGNOSIS — N184 Chronic kidney disease, stage 4 (severe): Secondary | ICD-10-CM | POA: Diagnosis not present

## 2023-11-08 ENCOUNTER — Encounter: Payer: Self-pay | Admitting: Cardiology

## 2023-11-08 ENCOUNTER — Ambulatory Visit: Attending: Cardiology | Admitting: Cardiology

## 2023-11-08 VITALS — BP 122/70 | HR 62 | Ht 66.0 in | Wt 189.0 lb

## 2023-11-08 DIAGNOSIS — Z95810 Presence of automatic (implantable) cardiac defibrillator: Secondary | ICD-10-CM

## 2023-11-08 DIAGNOSIS — Z952 Presence of prosthetic heart valve: Secondary | ICD-10-CM

## 2023-11-08 DIAGNOSIS — I5020 Unspecified systolic (congestive) heart failure: Secondary | ICD-10-CM

## 2023-11-08 NOTE — Patient Instructions (Addendum)
 Medication Instructions:  Your physician recommends that you continue on your current medications as directed. Please refer to the Current Medication list given to you today.   Labwork: None today  Testing/Procedures: Your physician has requested that you have an echocardiogram. Echocardiography is a painless test that uses sound waves to create images of your heart. It provides your doctor with information about the size and shape of your heart and how well your heart's chambers and valves are working. This procedure takes approximately one hour. There are no restrictions for this procedure. Please do NOT wear cologne, perfume, aftershave, or lotions (deodorant is allowed). Please arrive 15 minutes prior to your appointment time.  Please note: We ask at that you not bring children with you during ultrasound (echo/ vascular) testing. Due to room size and safety concerns, children are not allowed in the ultrasound rooms during exams. Our front office staff cannot provide observation of children in our lobby area while testing is being conducted. An adult accompanying a patient to their appointment will only be allowed in the ultrasound room at the discretion of the ultrasound technician under special circumstances. We apologize for any inconvenience.   Follow-Up: 6 months  Any Other Special Instructions Will Be Listed Below (If Applicable).  If you need a refill on your cardiac medications before your next appointment, please call your pharmacy.

## 2023-11-08 NOTE — Progress Notes (Signed)
 Cardiology Office Note  Date: 11/08/2023   ID: Cadie, Sorci 07-25-1946, MRN [id number]  History of Present Illness: [name] [name] is a 78 y.o. female last seen in August 2024.  She is here for a routine visit.  Reports NYHA class II dyspnea, no exertional chest pain, no palpitations or syncope.  She has been having trouble with back pain and seeing a spine specialist for further imaging.  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 Ladona Ridgel.  Recent device check in March shows normal function.  She has had no device shocks or syncope.  We went over her medications today.  She reports compliance with therapy.  States that Lasix and potassium supplement were cut to every other day by nephrologist recently, otherwise no change in baseline cardiac regimen.  I did review her recent lab work.  She is due for a follow-up echocardiogram.  Physical Exam: VS:  BP 122/70   Pulse 62   Ht 5\' 6"  (1.676 m)   Wt 189 lb (85.7 kg)   SpO2 98%   BMI 30.51 kg/m , BMI Body mass index is 30.51 kg/m.  Wt Readings from Last 3 Encounters:  11/08/23 189 lb (85.7 kg)  10/21/23 186 lb 15.9 oz (84.8 kg)  10/07/23 186 lb 15.9 oz (84.8 kg)    General: Patient appears comfortable at rest. HEENT: Conjunctiva and lids normal. Neck: Supple, no elevated JVP or carotid bruits. Lungs: Clear to auscultation, nonlabored breathing at rest. Cardiac: Regular rate and rhythm, no S3, 3/6 systolic murmur, no pericardial rub. Extremities: No pitting edema.  ECG:  An ECG dated 10/21/2023 was personally reviewed today and demonstrated:  Sinus rhythm with ventricular pacing.  Labwork: 12/14/2022: TSH 3.470 10/21/2023: B Natriuretic Peptide 289.0; Hemoglobin 11.0; Platelets 160 10/27/2023: ALT 24; AST 30; BUN 72; Creatinine, Ser 2.65; Potassium 5.1; Sodium 141     Component Value Date/Time   CHOL 146 10/27/2023 1157   TRIG 118 10/27/2023 1157   HDL 43 10/27/2023 1157   CHOLHDL 3.0 05/11/2023 0911   CHOLHDL 2.9  08/20/2019 1001   VLDL 21 02/06/2017 0916   LDLCALC 82 10/27/2023 1157   LDLCALC 94 08/20/2019 1001   Other Studies Reviewed Today:  No interval cardiac testing for review today.  Assessment and Plan:  1.  HFrEF with nonischemic cardiomyopathy, LVEF 20 to 25% by echocardiogram in March 2024.  Clinically stable with NYHA class II dyspnea and no fluid retention or increasing weight.  Updating echocardiogram as discussed below.  Continue Coreg 25 mg twice daily, Entresto 24/26 mg twice daily, and Aldactone 12.5 mg daily.  Lasix 40 mg and KCl 10 mEq have been changed to every other day most recently by nephrology.  Continue to track weight and go back to daily dosing as needed for fluid retention.   2.  History of severe aortic stenosis status post TAVR in 2016, 23 mm SAPIEN THV.  Overall function normal with mean AV gradient 13 mmHg and no aortic regurgitation by echocardiogram in March 2024.  Plan to update echocardiogram.  Continue aspirin 81 mg daily.   3.  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 Ladona Ridgel.  No device shocks or syncope.  Recent device check indicated normal function.   4.  CKD stage IV, recent creatinine 2.65 with GFR 18.  Continue to follow with nephrology.   5.  Primary hypertension.  Blood pressure control is good today, no changes made in present regimen.  Disposition:  Follow up  6 months.  Signed, Jonelle Sidle, M.D., F.A.C.C. [facility]

## 2023-11-09 ENCOUNTER — Other Ambulatory Visit: Payer: Self-pay | Admitting: Family Medicine

## 2023-11-13 ENCOUNTER — Ambulatory Visit (HOSPITAL_COMMUNITY)
Admission: RE | Admit: 2023-11-13 | Discharge: 2023-11-13 | Disposition: A | Discharge status: Home / Self Care | Source: Ambulatory Visit | Attending: Cardiology | Admitting: Cardiology

## 2023-11-13 DIAGNOSIS — I5020 Unspecified systolic (congestive) heart failure: Secondary | ICD-10-CM | POA: Diagnosis not present

## 2023-11-13 DIAGNOSIS — Z952 Presence of prosthetic heart valve: Secondary | ICD-10-CM | POA: Diagnosis not present

## 2023-11-13 LAB — ECHOCARDIOGRAM COMPLETE
AR max vel: 0.6 cm2
AV Area VTI: 0.58 cm2
AV Area mean vel: 0.64 cm2
AV Mean grad: 17 mmHg
AV Peak grad: 28.2 mmHg
Ao pk vel: 2.66 m/s
Area-P 1/2: 2.91 cm2
Calc EF: 33.3 %
MV M vel: 4.87 m/s
MV Peak grad: 94.9 mmHg
S' Lateral: 5.2 cm
Single Plane A2C EF: 41.8 %
Single Plane A4C EF: 20.3 %

## 2023-11-13 NOTE — Progress Notes (Addendum)
*  PRELIMINARY RESULTS* Echocardiogram 2D Echocardiogram has been performed. Patient did not want Definity at this time.  Stacey Drain 11/13/2023, 10:36 AM

## 2023-11-16 ENCOUNTER — Other Ambulatory Visit: Payer: Self-pay | Admitting: Family Medicine

## 2023-11-18 NOTE — Progress Notes (Signed)
 Necessary for workup

## 2023-11-20 ENCOUNTER — Other Ambulatory Visit: Payer: Self-pay | Admitting: Family Medicine

## 2023-11-22 ENCOUNTER — Encounter: Payer: Self-pay | Admitting: Family Medicine

## 2023-11-22 DIAGNOSIS — M4316 Spondylolisthesis, lumbar region: Secondary | ICD-10-CM | POA: Insufficient documentation

## 2023-11-22 NOTE — Progress Notes (Unsigned)
 [name] [name]     MRN: [id number]      DOB: 07-04-1946  Chief Complaint  Patient presents with   Hospitalization Follow-up    3/08 discharge for Neuropathy, sciatica, lightheaded     HPI [name] [name] is here for follow up of ED visit on 03/08, when she presented with uncontrolled low back pain radiating down leg, which has been an increasing and recurrent problem d for her in past 6 months No new weakness , numbness or incontinence, but increasingly miserable existence because of pain Does have appt with pain management in next week, which she definitely needs ROS Denies recent fever or chills. Denies sinus pressure, nasal congestion, ear pain or sore throat. Denies chest congestion, productive cough or wheezing. Denies chest pains, palpitations and leg swelling Denies abdominal pain, nausea, vomiting,diarrhea or constipation.   Denies dysuria, frequency, hesitancy or incontinence. . Denies depression, anxiety or insomnia. Denies skin break down or rash.   PE  BP 120/80   Pulse 76   Ht 5\' 6"  (1.676 m)   Wt 185 lb (83.9 kg)   BMI 29.86 kg/m    Patient alert and oriented and in no cardiopulmonary distress.In pain  HEENT: No facial asymmetry, EOMI,     Neck supple .  Chest: Clear to auscultation bilaterally.  CVS: S1, S2 systolic murmurs, no S3.Regular rate.  ABD: Soft non tender.   Ext: No edema  MS: decreased  ROM spine, adequate in shoulders, hips and knees.  Skin: Intact, no ulcerations or rash noted.  Psych: Good eye contact, normal affect. Memory intact not anxious or depressed appearing.  CNS: CN 2-12 intact, power,  normal throughout.no focal deficits noted.   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   The need to take medication as prescribed, test blood sugar as directed, and to call between visits if there is a concern that blood sugar is uncontrolled is also discussed.   [name] [name] is reminded of the importance of daily foot exam, annual eye examination, and good blood sugar, blood pressure and cholesterol control.     Latest Ref Rng & Units 10/21/2023   12:40 PM 08/30/2023    2:02 PM 05/11/2023    9:11 AM 12/14/2022   11:40 AM 07/21/2022   11:27 AM  Diabetic Labs  HbA1c 4.8 - 5.6 %  7.2  7.0  7.2  7.3   Micro/Creat Ratio 0 - 29 mg/g creat    <11    Chol 100 - 199 mg/dL   782  956  213   HDL >08 mg/dL   56  49  45   Calc LDL 0 - 99 mg/dL   94  91  657   Triglycerides 0 - 149 mg/dL   846  962  952   Creatinine 0.44 - 1.00 mg/dL 8.41   3.24  4.01        10/21/2023    4:25 PM 10/21/2023    4:05 PM 10/21/2023    3:05 PM 10/21/2023   12:29 PM 10/21/2023   12:23 PM 10/07/2023    3:41 PM 10/07/2023    2:36 PM  BP/Weight  Systolic BP 114  114 92 101  117   Diastolic BP 66 63 70 49  63   Wt. (Lbs)     186.99  186.99  BMI     30.18 kg/m2  30.18 kg/m2      Latest Ref Rng & Units 08/30/2023    1:00 PM 10/05/2022   12:00 AM  Foot/eye exam completion dates  Eye Exam No Retinopathy  No Retinopathy      Foot Form Completion  Done      This result is from an external source.        Hyperlipidemia LDL goal <70 Hyperlipidemia:Low fat diet discussed and encouraged.   Lipid Panel  Lab Results  Component Value Date   CHOL 170 05/11/2023   HDL 56 05/11/2023   LDLCALC 94 05/11/2023   TRIG 110 05/11/2023   CHOLHDL 3.0 05/11/2023     Updated lab needed at/ before next visit.   Back pain with radiculopathy Uncontrolled pain with increasing ED visits for same, ppt to keep appt with pain management for improved control which she is looking forward to Fall reduction discussed  Encounter for examination following  treatment at hospital Patient in for follow up of recent Ed visit on 10/21/2023 Discharge summary, and laboratory and radiology data are reviewed, and any questions or concerns  are discussed. Specific issues requiring follow up are specifically addressed.   Benign essential hypertension Controlled, no change in medication   Hyperlipidemia associated with type 2 diabetes mellitus (HCC) Hyperlipidemia:Low fat diet discussed and encouraged.   Lipid Panel  Lab Results  Component Value Date   CHOL 146 10/27/2023   HDL 43 10/27/2023   LDLCALC 82 10/27/2023   TRIG 118 10/27/2023   CHOLHDL 3.0 05/11/2023     Controlled, no change in medication

## 2023-11-24 ENCOUNTER — Ambulatory Visit: Admitting: Orthopedic Surgery

## 2023-11-24 NOTE — Addendum Note (Signed)
 Addended by: Geralyn Flash D on: 11/24/2023 02:58 PM   Modules accepted: Orders

## 2023-11-24 NOTE — Assessment & Plan Note (Signed)
 Uncontrolled pain with increasing ED visits for same, ppt to keep appt with pain management for improved control which she is looking forward to Fall reduction discussed

## 2023-11-24 NOTE — Assessment & Plan Note (Signed)
 Controlled, no change in medication

## 2023-11-24 NOTE — Assessment & Plan Note (Signed)
 Hyperlipidemia:Low fat diet discussed and encouraged.   Lipid Panel  Lab Results  Component Value Date   CHOL 146 10/27/2023   HDL 43 10/27/2023   LDLCALC 82 10/27/2023   TRIG 118 10/27/2023   CHOLHDL 3.0 05/11/2023     Controlled, no change in medication

## 2023-11-24 NOTE — Progress Notes (Signed)
 Remote ICD transmission.

## 2023-11-24 NOTE — Assessment & Plan Note (Signed)
 Patient in for follow up of recent Ed visit on 10/21/2023 Discharge summary, and laboratory and radiology data are reviewed, and any questions or concerns  are discussed. Specific issues requiring follow up are specifically addressed.

## 2023-11-27 ENCOUNTER — Other Ambulatory Visit: Payer: Self-pay | Admitting: Family Medicine

## 2023-11-28 ENCOUNTER — Ambulatory Visit (HOSPITAL_COMMUNITY)
Admission: RE | Admit: 2023-11-28 | Discharge: 2023-11-28 | Disposition: A | Discharge status: Home / Self Care | Source: Ambulatory Visit | Attending: Surgery | Admitting: Surgery

## 2023-11-28 DIAGNOSIS — M4316 Spondylolisthesis, lumbar region: Secondary | ICD-10-CM | POA: Diagnosis not present

## 2023-11-28 DIAGNOSIS — M47816 Spondylosis without myelopathy or radiculopathy, lumbar region: Secondary | ICD-10-CM | POA: Diagnosis not present

## 2023-11-28 DIAGNOSIS — M48061 Spinal stenosis, lumbar region without neurogenic claudication: Secondary | ICD-10-CM | POA: Diagnosis not present

## 2023-11-28 DIAGNOSIS — M47817 Spondylosis without myelopathy or radiculopathy, lumbosacral region: Secondary | ICD-10-CM | POA: Diagnosis not present

## 2023-11-30 ENCOUNTER — Ambulatory Visit (HOSPITAL_COMMUNITY)

## 2023-12-02 ENCOUNTER — Other Ambulatory Visit: Payer: Self-pay | Admitting: Family Medicine

## 2023-12-07 DIAGNOSIS — R809 Proteinuria, unspecified: Secondary | ICD-10-CM | POA: Diagnosis not present

## 2023-12-07 DIAGNOSIS — D631 Anemia in chronic kidney disease: Secondary | ICD-10-CM | POA: Diagnosis not present

## 2023-12-07 DIAGNOSIS — N189 Chronic kidney disease, unspecified: Secondary | ICD-10-CM | POA: Diagnosis not present

## 2023-12-07 DIAGNOSIS — E211 Secondary hyperparathyroidism, not elsewhere classified: Secondary | ICD-10-CM | POA: Diagnosis not present

## 2023-12-13 DIAGNOSIS — I129 Hypertensive chronic kidney disease with stage 1 through stage 4 chronic kidney disease, or unspecified chronic kidney disease: Secondary | ICD-10-CM | POA: Diagnosis not present

## 2023-12-13 DIAGNOSIS — D638 Anemia in other chronic diseases classified elsewhere: Secondary | ICD-10-CM | POA: Diagnosis not present

## 2023-12-13 DIAGNOSIS — N179 Acute kidney failure, unspecified: Secondary | ICD-10-CM | POA: Diagnosis not present

## 2023-12-13 DIAGNOSIS — E1122 Type 2 diabetes mellitus with diabetic chronic kidney disease: Secondary | ICD-10-CM | POA: Diagnosis not present

## 2023-12-17 ENCOUNTER — Other Ambulatory Visit: Payer: Self-pay | Admitting: Cardiology

## 2023-12-17 ENCOUNTER — Other Ambulatory Visit: Payer: Self-pay | Admitting: Orthopedic Surgery

## 2023-12-17 DIAGNOSIS — M1A0610 Idiopathic chronic gout, right knee, without tophus (tophi): Secondary | ICD-10-CM

## 2023-12-18 DIAGNOSIS — M4316 Spondylolisthesis, lumbar region: Secondary | ICD-10-CM | POA: Diagnosis not present

## 2023-12-18 DIAGNOSIS — Z6830 Body mass index (BMI) 30.0-30.9, adult: Secondary | ICD-10-CM | POA: Diagnosis not present

## 2023-12-18 DIAGNOSIS — M5416 Radiculopathy, lumbar region: Secondary | ICD-10-CM | POA: Diagnosis not present

## 2023-12-20 NOTE — Therapy (Unsigned)
 OUTPATIENT PHYSICAL THERAPY THORACOLUMBAR EVALUATION   Patient Name: [name] [name] MRN: [id number] DOB:1946-05-02, 78 y.o., female Today's Date: 12/21/2023  END OF SESSION:  PT End of Session - 12/21/23 1103     Visit Number 1    Number of Visits 6    Date for PT Re-Evaluation 02/01/24    Authorization Type [employer]    Authorization Time Period No auth, no limit    Progress Note Due on Visit 6    PT Start Time 1105    PT Stop Time 1141    PT Time Calculation (min) 36 min    Activity Tolerance Patient tolerated treatment well    Behavior During Therapy WFL for tasks assessed/performed             Past Medical History:  Diagnosis Date   Acute hypoxemic respiratory failure due to COVID-19 (HCC) 06/29/2020   Anxiety    Aortic stenosis    Arthritis    Biventricular ICD (implantable cardioverter-defibrillator) in place    March 2021, [employer]  -Dr. 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   23 mm Edwards Sapien 3 transcatheter heart valve placed via open right transfemoral approach   Symptomatic PVCs    Past Surgical History:  Procedure Laterality Date   BIV ICD INSERTION CRT-D N/A 10/28/2019   Procedure: BIV ICD INSERTION CRT-D;  Surgeon: Tammie Fall, MD;  Location: [facility] INVASIVE CV LAB;  Service: Cardiovascular;  Laterality: N/A;   CARDIAC CATHETERIZATION     CESAREAN SECTION  1987    FLEXIBLE BRONCHOSCOPY N/A 01/14/2015   Procedure: FLEXIBLE BRONCHOSCOPY;  Surgeon: Zelphia Higashi, MD;  Location: [facility] OR;  Service: Thoracic;  Laterality: N/A;   LEFT AND RIGHT HEART CATHETERIZATION WITH CORONARY ANGIOGRAM N/A 12/05/2011   Procedure: LEFT AND RIGHT HEART CATHETERIZATION WITH CORONARY ANGIOGRAM;  Surgeon: Odie Benne, MD;  Location: [facility] CATH LAB;  Service: Cardiovascular;  Laterality: N/A;   LEFT AND RIGHT HEART CATHETERIZATION WITH CORONARY ANGIOGRAM N/A 05/23/2014   Procedure: LEFT AND RIGHT HEART CATHETERIZATION WITH CORONARY ANGIOGRAM;  Surgeon: Odie Benne, MD;  Location: [facility] CATH LAB;  Service: Cardiovascular;  Laterality: N/A;   MULTIPLE EXTRACTIONS WITH ALVEOLOPLASTY N/A 02/27/2014   Procedure: Extraction of tooth #'s 2,4,5,6,7,8,9,10,11,12, 22, 23, 24, 25, 26, 28 with alveoloplasty and bilateral mandibular tori reductions;  Surgeon: Carol Chroman, DDS;  Location: [facility] OR;  Service: Oral Surgery;  Laterality: N/A;   RIGID BRONCHOSCOPY N/A 01/14/2015   Procedure: RIGID BRONCHOSCOPY with Removal Of Foreign Body ;  Surgeon: Zelphia Higashi, MD;  Location: [facility] OR;  Service: Thoracic;  Laterality: N/A;   TEE WITHOUT CARDIOVERSION N/A 03/24/2015   Procedure: TRANSESOPHAGEAL ECHOCARDIOGRAM (TEE);  Surgeon: Odie Benne, MD;  Location: [facility] OR;  Service: Open Heart Surgery;  Laterality: N/A;   TRANSCATHETER AORTIC VALVE  REPLACEMENT, TRANSFEMORAL Bilateral 03/24/2015   Procedure: TRANSCATHETER AORTIC VALVE REPLACEMENT, TRANSFEMORAL;  Surgeon: Odie Benne, MD;  Location: [facility] OR;  Service: Open Heart Surgery;  Laterality: Bilateral;   TUBAL LIGATION  1987   VIDEO BRONCHOSCOPY Bilateral 06/05/2014   Procedure: VIDEO BRONCHOSCOPY WITHOUT FLUORO;  Surgeon: Marine Sia, MD;  Location: [facility] ENDOSCOPY;  Service: Cardiopulmonary;  Laterality: Bilateral;   Patient Active Problem List   Diagnosis Date Noted   Spondylolisthesis of lumbar region  11/22/2023   Viral illness 07/20/2023   Encounter for immunization 06/19/2023   Back pain with radiculopathy 06/19/2023   Encounter for examination following treatment at hospital 06/19/2023   Grief at loss of child 04/25/2023   Pain due to onychomycosis of toenails of both feet 11/23/2022   Type 2 diabetes mellitus with vascular disease (HCC) 11/23/2022   Encounter for Medicare annual examination with abnormal findings 12/07/2021   Neck pain 02/08/2021   Cervicogenic headache 02/08/2021   CKD (chronic kidney disease), stage IV (HCC) 06/29/2020   Hyperlipidemia associated with type 2 diabetes mellitus (HCC) 06/29/2020   Benign essential hypertension 05/27/2020   Chronic combined systolic and diastolic heart failure (HCC) 05/27/2020   Generalized osteoarthritis 07/26/2019   Osteoarthritis of both knees 03/16/2017   History of aortic valve replacement 03/24/2015   Hyperlipidemia LDL goal <70 05/24/2014   Severe aortic stenosis 05/23/2014   Solitary pulmonary nodule 02/10/2014   Left bundle-branch block 12/03/2013   Type 2 DM with CKD stage 4 and hypertension (HCC) 05/21/2013   Seasonal allergies 02/22/2012   Cardiomyopathy (HCC) 10/14/2011   Obesity (BMI 30.0-34.9) 10/24/2007    PCP: Towanda Fret, MD  REFERRING PROVIDER: Assunta Lax, PA-C  REFERRING DIAG:  lumbar spondylilisthesis    Rationale for Evaluation and Treatment: Rehabilitation  THERAPY DIAG:  Other low back pain  Decreased ROM of lumbar spine  Muscle weakness (generalized)  Spondylolisthesis of lumbar region  ONSET DATE: 6 months  SUBJECTIVE:                                                                                                                                                                                           SUBJECTIVE STATEMENT: Patient reports back pain started hurting randomly 6 months ago. Reports pain as being in thoracic and lumbar spine and travels down to R leg  all the way to foot at times. Feels like a burning sensation down the leg, reports back pain as a nagging pain/ache. Reports most pain at night time, with sitting or laying for long periods of time. Takes tylenol  and helps some.  PERTINENT HISTORY:  Has a fibrillator, 2020  Artifical heart valve, 2015 Bilateral arthritis in knees   PAIN:  Are you having pain? No  PRECAUTIONS: None  RED FLAGS: None   WEIGHT BEARING RESTRICTIONS: No  FALLS:  Has patient fallen in last 6 months? No  LIVING ENVIRONMENT: Has following equipment at home: Single point cane and Rollator Uses SPC most  OCCUPATION: Retired  PLOF: Independent  PATIENT GOALS: "Have less soreness and pain"  NEXT MD VISIT: N/A  OBJECTIVE:  Note: Objective measures were completed at Evaluation unless otherwise noted.  DIAGNOSTIC FINDINGS:  IMPRESSION: 1. Grade 1 anterolisthesis of L4 on L5 and L5 on S1 secondary to facet arthropathy. 2. Moderate bilateral neuroforaminal stenosis at L4-L5 and L5-S1. 3. Mild canal stenosis at L4-L5.  PATIENT SURVEYS:  Modified Oswestry Low Back Pain Disability Questionnaire: 18 / 50 = 36.0 %  COGNITION: Overall cognitive status: Within functional limits for tasks assessed     SENSATION: WFL  POSTURE: rounded shoulders and forward head  PALPATION: TTP t/o lumbar spine. Soreness and tightness t/o lumbar and thoracic paraspinals  LUMBAR ROM:   AROM eval  Flexion To ankles  Extension 25% avail *  Right lateral flexion To knee jt  Left lateral flexion To knee jt  Right rotation 15% avail  Left rotation 15% avail   (Blank rows = not tested) * = painful  LOWER EXTREMITY ROM:     Active  Right eval Left eval  Hip flexion    Hip extension    Hip abduction    Hip adduction    Hip internal rotation    Hip external rotation    Knee flexion    Knee extension    Ankle dorsiflexion    Ankle plantarflexion    Ankle inversion    Ankle eversion     (Blank rows = not  tested)  LOWER EXTREMITY MMT:    MMT Right eval Left eval  Hip flexion 4+ * in knees 4+ * in knees   Hip extension 3+ * in low back  3+ * in low back   Hip abduction 4- 4-  Hip adduction    Hip internal rotation    Hip external rotation    Knee flexion 4- 4-  Knee extension 4- 4-  Ankle dorsiflexion 5 5  Ankle plantarflexion    Ankle inversion    Ankle eversion     (Blank rows = not tested)  * = painful  LUMBAR SPECIAL TESTS:  Straight leg raise test:   and Quadrant test: Negative  FUNCTIONAL TESTS:  30 seconds chair stand test  : 10 STS  GAIT: Distance walked: 100 Assistive device utilized: Single point cane Level of assistance: Modified independence Comments: Pt ambulates with SPC, antalgic-like gait, forward lean, mild hip hiking bilat., dec knee flex, dec hip ext/flex bilaterally.  TREATMENT DATE:  12/21/23: PT eval and HEP  PATIENT EDUCATION:  Education details: PT evaluation, objective findings, POC, Importance of HEP, Precautions, Clinic policies, Anatomy Person educated: Patient Education method: Explanation and Demonstration Education comprehension: verbalized understanding and returned demonstration  HOME EXERCISE PROGRAM: Access Code: [email or web address] URL: [email or web address] Date: 12/21/2023 Prepared by: Virgia Griffins Powell-Butler  Exercises - Supine Lower Trunk Rotation  - 2 x daily - 7 x weekly - 2 sets - 10 reps - Hooklying Single Knee to Chest Stretch  - 2 x daily - 7 x weekly - 2 sets - 10 reps - Supine Double Knee to Chest  - 2 x daily - 7 x weekly - 2 sets - 10 reps - Sit to Stand  - 2 x daily - 7 x weekly - 2 sets - 10 reps  ASSESSMENT:  CLINICAL IMPRESSION: Patient is a 78 y.o. female who was seen today for physical therapy evaluation and treatment for  lumbar spondylilisthesis  . On this date, patient  demonstrates dec LE strength, dec lumbar ROM, increased pain with LE MMT testing, tenderness when palpating lumbar spinous processes, and tightness and soreness in thoracic and lumbar paraspinal musculature. Unable to reproduce radiating pain into RLE on this date as pt describes pain most at nights or with prolonged positions but familiar low back pain is reproduced with grade II-III CPAs to lumbar spine. Patient will benefit from skilled physical therapy in order to address the above deficits to improve independence, function, and QOL.    OBJECTIVE IMPAIRMENTS: Abnormal gait, decreased activity tolerance, decreased endurance, decreased mobility, difficulty walking, decreased ROM, decreased strength, improper body mechanics, postural dysfunction, and pain.   ACTIVITY LIMITATIONS: carrying, lifting, bending, sitting, standing, squatting, stairs, transfers, bed mobility, and reach over head  PARTICIPATION LIMITATIONS: cleaning, laundry, driving, community activity, and yard work  [facility] POTENTIAL: Good  CLINICAL DECISION MAKING: Stable/uncomplicated  EVALUATION COMPLEXITY: Low   GOALS: Goals reviewed with patient? No  SHORT TERM GOALS: Target date: 01/04/24 Patient will be independent with performance of HEP to demonstrate adequate self management of symptoms.  Baseline:  Goal status: INITIAL  2.   Patient will report at least a 25% improvement with function or pain overall since beginning PT. Baseline:  Goal status: INITIAL   LONG TERM GOALS: Target date: 02/01/24 Patient will improve Oswestry score by   10  points in order to improve self-perceived disability and overall function.  Baseline: Goal status: INITIAL   2.  Patient will improve bilat hip ext MMT  to at least 4-  for improved hip ext during gait. Baseline: Goal status: INITIAL   3.  Patient will improve  30 sec chair rise  test to at least 12 STS  in order to improve LE strength and endurance for improved  gait. Baseline:  Goal status: INITIAL   4.  Patient will report overall 50% improvement since beginning PT. Baseline:  Goal status: INITIAL   PLAN:  PT FREQUENCY: 1-2x/week  PT DURATION: 6 weeks  PLANNED INTERVENTIONS: 97164- PT Re-evaluation, 97110-Therapeutic exercises, 97530- Therapeutic activity, 97112- Neuromuscular re-education, 97535- Self Care, 54098- Manual therapy, [phone]- Gait training, [phone]- Electrical stimulation (manual), Patient/Family education, Balance training, Stair training, Dry Needling, Joint mobilization, Spinal mobilization, DME instructions, and Moist heat.  PLAN FOR NEXT SESSION: SLR test, test, progress lumbar and thoracic mobility, core strengthening, LE strengthening    5:42 PM, 12/21/23 Ester Hilley Powell-Butler, PT, DPT [facility] [place]

## 2023-12-21 ENCOUNTER — Encounter (HOSPITAL_COMMUNITY): Payer: Self-pay

## 2023-12-21 ENCOUNTER — Other Ambulatory Visit: Payer: Self-pay

## 2023-12-21 ENCOUNTER — Ambulatory Visit (HOSPITAL_COMMUNITY): Attending: Surgery

## 2023-12-21 DIAGNOSIS — M5386 Other specified dorsopathies, lumbar region: Secondary | ICD-10-CM | POA: Insufficient documentation

## 2023-12-21 DIAGNOSIS — M6281 Muscle weakness (generalized): Secondary | ICD-10-CM | POA: Diagnosis not present

## 2023-12-21 DIAGNOSIS — M4316 Spondylolisthesis, lumbar region: Secondary | ICD-10-CM | POA: Insufficient documentation

## 2023-12-21 DIAGNOSIS — M5459 Other low back pain: Secondary | ICD-10-CM | POA: Diagnosis not present

## 2023-12-25 ENCOUNTER — Ambulatory Visit (HOSPITAL_COMMUNITY)

## 2023-12-25 ENCOUNTER — Encounter (HOSPITAL_COMMUNITY): Payer: Self-pay

## 2023-12-25 DIAGNOSIS — M5459 Other low back pain: Secondary | ICD-10-CM | POA: Diagnosis not present

## 2023-12-25 DIAGNOSIS — M5386 Other specified dorsopathies, lumbar region: Secondary | ICD-10-CM

## 2023-12-25 DIAGNOSIS — M4316 Spondylolisthesis, lumbar region: Secondary | ICD-10-CM

## 2023-12-25 DIAGNOSIS — M6281 Muscle weakness (generalized): Secondary | ICD-10-CM

## 2023-12-25 NOTE — Therapy (Signed)
 OUTPATIENT PHYSICAL THERAPY THORACOLUMBAR TREATMENT   Patient Name: [name] [name] MRN: [id number] DOB:03/22/1946, 78 y.o., female Today's Date: 12/25/2023  END OF SESSION:  PT End of Session - 12/25/23 0936     Visit Number 2    Number of Visits 6    Date for PT Re-Evaluation 02/01/24    Authorization Type [employer] P[employer]    Authorization Time Period No auth, no limit    Progress Note Due on Visit 6    PT Start Time 0937    PT Stop Time 1015    PT Time Calculation (min) 38 min    Activity Tolerance Patient tolerated treatment well    Behavior During Therapy [facility] for tasks assessed/performed             Past Medical History:  Diagnosis Date   Acute hypoxemic respiratory failure due to COVID-19 (HCC) 06/29/2020   Anxiety    Aortic stenosis    Arthritis    Biventricular ICD (implantable cardioverter-defibrillator) in place    March 2021, [employer]  -Dr. 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 INVASIVE CV LAB;  Service: Cardiovascular;  Laterality: N/A;   CARDIAC CATHETERIZATION     CESAREAN SECTION  1987    FLEXIBLE BRONCHOSCOPY N/A 01/14/2015   Procedure: FLEXIBLE BRONCHOSCOPY;  Surgeon: Zelphia Higashi, MD;  Location: [facility] OR;  Service: Thoracic;  Laterality: N/A;   LEFT AND RIGHT HEART CATHETERIZATION WITH CORONARY ANGIOGRAM N/A 12/05/2011   Procedure: LEFT AND RIGHT HEART CATHETERIZATION WITH CORONARY ANGIOGRAM;  Surgeon: Odie Benne, MD;  Location: [facility] CATH LAB;  Service: Cardiovascular;  Laterality: N/A;   LEFT AND RIGHT HEART CATHETERIZATION WITH CORONARY ANGIOGRAM N/A 05/23/2014   Procedure: LEFT AND RIGHT HEART CATHETERIZATION WITH CORONARY ANGIOGRAM;  Surgeon: Odie Benne, MD;  Location: [facility] CATH LAB;  Service: Cardiovascular;  Laterality: N/A;   MULTIPLE EXTRACTIONS WITH ALVEOLOPLASTY N/A 02/27/2014   Procedure: Extraction of tooth #'s 2,4,5,6,7,8,9,10,11,12, 22, 23, 24, 25, 26, 28 with alveoloplasty and bilateral mandibular tori reductions;  Surgeon: Carol Chroman, DDS;  Location: [facility] OR;  Service: Oral Surgery;  Laterality: N/A;   RIGID BRONCHOSCOPY N/A 01/14/2015   Procedure: RIGID BRONCHOSCOPY with Removal Of Foreign Body ;  Surgeon: Zelphia Higashi, MD;  Location: [facility] OR;  Service: Thoracic;  Laterality: N/A;   TEE WITHOUT CARDIOVERSION N/A 03/24/2015   Procedure: TRANSESOPHAGEAL ECHOCARDIOGRAM (TEE);  Surgeon: Odie Benne, MD;  Location: [facility] OR;  Service: Open Heart Surgery;  Laterality: N/A;   TRANSCATHETER AORTIC VALVE  REPLACEMENT, TRANSFEMORAL Bilateral 03/24/2015   Procedure: TRANSCATHETER AORTIC VALVE REPLACEMENT, TRANSFEMORAL;  Surgeon: Odie Benne, MD;  Location: [facility] OR;  Service: Open Heart Surgery;  Laterality: Bilateral;   TUBAL LIGATION  1987   VIDEO BRONCHOSCOPY Bilateral 06/05/2014   Procedure: VIDEO BRONCHOSCOPY WITHOUT FLUORO;  Surgeon: Marine Sia, MD;  Location: [facility] ENDOSCOPY;  Service: Cardiopulmonary;  Laterality: Bilateral;   Patient Active Problem List   Diagnosis Date Noted   Spondylolisthesis of lumbar region  11/22/2023   Viral illness 07/20/2023   Encounter for immunization 06/19/2023   Back pain with radiculopathy 06/19/2023   Encounter for examination following treatment at hospital 06/19/2023   Grief at loss of child 04/25/2023   Pain due to onychomycosis of toenails of both feet 11/23/2022   Type 2 diabetes mellitus with vascular disease (HCC) 11/23/2022   Encounter for Medicare annual examination with abnormal findings 12/07/2021   Neck pain 02/08/2021   Cervicogenic headache 02/08/2021   CKD (chronic kidney disease), stage IV (HCC) 06/29/2020   Hyperlipidemia associated with type 2 diabetes mellitus (HCC) 06/29/2020   Benign essential hypertension 05/27/2020   Chronic combined systolic and diastolic heart failure (HCC) 05/27/2020   Generalized osteoarthritis 07/26/2019   Osteoarthritis of both knees 03/16/2017   History of aortic valve replacement 03/24/2015   Hyperlipidemia LDL goal <70 05/24/2014   Severe aortic stenosis 05/23/2014   Solitary pulmonary nodule 02/10/2014   Left bundle-branch block 12/03/2013   Type 2 DM with CKD stage 4 and hypertension (HCC) 05/21/2013   Seasonal allergies 02/22/2012   Cardiomyopathy (HCC) 10/14/2011   Obesity (BMI 30.0-34.9) 10/24/2007    PCP: Towanda Fret, MD  REFERRING PROVIDER: Assunta Lax, PA-C  REFERRING DIAG:  lumbar spondylilisthesis    Rationale for Evaluation and Treatment: Rehabilitation  THERAPY DIAG:  Other low back pain  Decreased ROM of lumbar spine  Muscle weakness (generalized)  Spondylolisthesis of lumbar region  ONSET DATE: 6 months  SUBJECTIVE:                                                                                                                                                                                           SUBJECTIVE STATEMENT: Back still bothering her at night time. Not bothering her right now. Did some of her HEP and did more walking. Reports she is just sore.    Patient reports back pain started hurting randomly 6 months ago. Reports pain as being in thoracic and lumbar spine and travels down to R leg all the way to foot at times. Feels like a burning sensation down the leg, reports back pain as a nagging pain/ache.  Reports most pain at night time, with sitting or laying for long periods of time. Takes tylenol  and helps some.  PERTINENT HISTORY:  Has a fibrillator, 2020 Artifical heart valve, 2015 Bilateral arthritis in knees   PAIN:  Are you having pain? No  PRECAUTIONS: None  RED FLAGS: None   WEIGHT BEARING RESTRICTIONS: No  FALLS:  Has patient fallen in last 6 months? No  LIVING ENVIRONMENT: Has following equipment at home: Single point cane and Rollator Uses SPC most  OCCUPATION: Retired  PLOF: Independent  PATIENT GOALS: "Have less soreness and pain"  NEXT MD VISIT: N/A  OBJECTIVE:  Note: Objective measures were completed at Evaluation unless otherwise noted.  DIAGNOSTIC FINDINGS:  IMPRESSION: 1. Grade 1 anterolisthesis of L4 on L5 and L5 on S1 secondary to facet arthropathy. 2. Moderate bilateral neuroforaminal stenosis at L4-L5 and L5-S1. 3. Mild canal stenosis at L4-L5.  PATIENT SURVEYS:  Modified Oswestry Low Back Pain Disability Questionnaire: 18 / 50 = 36.0 %  COGNITION: Overall cognitive status: Within functional limits for tasks assessed     SENSATION: WFL  POSTURE: rounded shoulders and forward head  PALPATION: TTP t/o lumbar spine. Soreness and tightness t/o lumbar and thoracic paraspinals  LUMBAR ROM:   AROM eval  Flexion To ankles  Extension 25% avail *  Right lateral flexion To knee jt  Left lateral flexion To knee jt  Right rotation 15% avail  Left rotation 15% avail   (Blank rows = not tested) * = painful  LOWER EXTREMITY ROM:     Active  Right eval Left eval  Hip flexion    Hip extension    Hip abduction    Hip adduction    Hip internal rotation    Hip external rotation     Knee flexion    Knee extension    Ankle dorsiflexion    Ankle plantarflexion    Ankle inversion    Ankle eversion     (Blank rows = not tested)  LOWER EXTREMITY MMT:    MMT Right eval Left eval  Hip flexion 4+ * in knees 4+ * in knees   Hip extension 3+ * in low back  3+ * in low back   Hip abduction 4- 4-  Hip adduction    Hip internal rotation    Hip external rotation    Knee flexion 4- 4-  Knee extension 4- 4-  Ankle dorsiflexion 5 5  Ankle plantarflexion    Ankle inversion    Ankle eversion     (Blank rows = not tested)  * = painful  LUMBAR SPECIAL TESTS:  Straight leg raise test: Negative and Quadrant test: Negative  FUNCTIONAL TESTS:  30 seconds chair stand test  : 10 STS   12/25/23: : 218', no pain in low back, little pain in LE  GAIT: Distance walked: 100 Assistive device utilized: Single point cane Level of assistance: Modified independence Comments: Pt ambulates with SPC, antalgic-like gait, forward lean, mild hip hiking bilat., dec knee flex, dec hip ext/flex bilaterally.  TREATMENT DATE:  12/25/23:  Review of HEP and goals test LTR, 10x SKTC, 10x each LE DKTC, 5x Straight Leg raise Test Piriformis stretch, RLE 2x30" Hamstring stretch, RLE, 30" Core Set with physioball, UE pushing ball into mat table, 10" holds   12/21/23: PT eval and HEP  PATIENT EDUCATION:  Education details: PT evaluation, objective findings, POC, Importance of HEP, Precautions, Clinic policies, Anatomy Person educated: Patient Education method: Explanation and Demonstration Education comprehension: verbalized understanding and returned demonstration  HOME EXERCISE PROGRAM: Access Code: [email or web address] URL: [email or web address] Date: 12/21/2023 Prepared by: Virgia Griffins Powell-Butler  Exercises - Supine Lower Trunk Rotation  - 2 x daily  - 7 x weekly - 2 sets - 10 reps - Hooklying Single Knee to Chest Stretch  - 2 x daily - 7 x weekly - 2 sets - 10 reps - Supine Double Knee to Chest  - 2 x daily - 7 x weekly - 2 sets - 10 reps - Sit to Stand  - 2 x daily - 7 x weekly - 2 sets - 10 reps   Exercises - Supine Piriformis Stretch with Foot on Ground  - 2 x daily - 7 x weekly - 3 sets - 30 hold - Seated Piriformis Stretch with Trunk Bend  - 2 x daily - 7 x weekly - 3 sets - 30 hold - Seated Hamstring Stretch  - 2 x daily - 7 x weekly - 3 sets - 30 hold  ASSESSMENT:  CLINICAL IMPRESSION: Patient tolerated session well. Reviewed HEP and goals. Patient required some verbal cueing during HEP review. Completed 2 minute walk test, with patient reporting no increase in pain t/o. Unable to replicate RLE symptoms on this date as well. Patient reports pain is intermittent, occurred briefly this morning. Reports it feels like a burning sensation that runs down RLE. SLR test negative. Addition of piriformis and hamstring stretching to HEP. Ended session with core set to begin core strengthening program. Patient will benefit from continued skilled physical therapy in order to address deficits to decrease daily/nightly pain as well as improve overall function.    Patient is a 78 y.o. female who was seen today for physical therapy evaluation and treatment for  lumbar spondylilisthesis  . On this date, patient demonstrates dec LE strength, dec lumbar ROM, increased pain with LE MMT testing, tenderness when palpating lumbar spinous processes, and tightness and soreness in thoracic and lumbar paraspinal musculature. Unable to reproduce radiating pain into RLE on this date as pt describes pain most at nights or with prolonged positions but familiar low back pain is reproduced with grade II-III CPAs to lumbar spine. Patient will benefit from skilled physical therapy in order to address the above deficits to improve independence, function, and QOL.     OBJECTIVE IMPAIRMENTS: Abnormal gait, decreased activity tolerance, decreased endurance, decreased mobility, difficulty walking, decreased ROM, decreased strength, improper body mechanics, postural dysfunction, and pain.   ACTIVITY LIMITATIONS: carrying, lifting, bending, sitting, standing, squatting, stairs, transfers, bed mobility, and reach over head  PARTICIPATION LIMITATIONS: cleaning, laundry, driving, community activity, and yard work  [facility] POTENTIAL: Good  CLINICAL DECISION MAKING: Stable/uncomplicated  EVALUATION COMPLEXITY: Low   GOALS: Goals reviewed with patient? Yes  SHORT TERM GOALS: Target date: 01/04/24 Patient will be independent with performance of HEP to demonstrate adequate self management of symptoms.  Baseline:  Goal status: INITIAL  2.   Patient will report at least a 25% improvement with function or pain overall since beginning PT. Baseline:  Goal status: INITIAL   LONG TERM GOALS: Target date: 02/01/24 Patient will improve Oswestry score by   10  points in order to improve self-perceived disability and overall function.  Baseline: Goal status: INITIAL   2.  Patient will improve bilat hip ext MMT  to at least 4-  for improved hip ext during gait. Baseline: Goal status: INITIAL   3.  Patient will improve  30 sec chair rise  test to at least 12 STS  in order to improve LE strength and endurance for improved gait. Baseline:  Goal status: INITIAL   4.  Patient will report overall 50% improvement since beginning PT. Baseline:  Goal status: INITIAL   PLAN:  PT FREQUENCY: 1-2x/week  PT DURATION: 6 weeks  PLANNED INTERVENTIONS: 97164- PT Re-evaluation, 97110-Therapeutic exercises, 97530- Therapeutic activity, 97112- Neuromuscular re-education, 97535- Self Care, 60454- Manual therapy, [phone]- Gait training, [phone]- Electrical stimulation (manual), Patient/Family education, Balance training, Stair training, Dry Needling, Joint mobilization, Spinal  mobilization, DME instructions, and Moist heat.  PLAN FOR NEXT SESSION: progress lumbar and thoracic mobility, add core strengthening to HEP, LE strengthening    10:28 AM, 12/25/23 Marysue Sola, PT, DPT [facility] [place]

## 2023-12-28 ENCOUNTER — Other Ambulatory Visit: Payer: Self-pay | Admitting: Family Medicine

## 2024-01-01 ENCOUNTER — Ambulatory Visit: Admitting: Orthopedic Surgery

## 2024-01-01 DIAGNOSIS — M1712 Unilateral primary osteoarthritis, left knee: Secondary | ICD-10-CM

## 2024-01-01 MED ORDER — METHYLPREDNISOLONE ACETATE 40 MG/ML IJ SUSP
40.0000 mg | Freq: Once | INTRAMUSCULAR | Status: AC
  Administered 2024-01-01: 40 mg via INTRA_ARTICULAR

## 2024-01-01 NOTE — Progress Notes (Signed)
 Chief Complaint  Patient presents with   Knee Pain    Bilateral- L>R    78 year old female left knee pain  Right lower extremity radicular pain soon to get epidural injection  She actually complains of pain in her left knee that runs down into her left foot Unlikely this is a cause by isolated knee pain  Probably has some neurogenic component to this as well on the left side However she is complaining of night pain in the left knee  I offered her an injection she agreed to have it  Procedure note left knee injection   verbal consent was obtained to inject left knee joint  Timeout was completed to confirm the site of injection  The medications used were depomedrol 40 mg and 1% lidocaine  3 cc Anesthesia was provided by ethyl chloride and the skin was prepped with alcohol .  After cleaning the skin with alcohol  a 20-gauge needle was used to inject the left knee joint. There were no complications. A sterile bandage was applied.

## 2024-01-01 NOTE — Patient Instructions (Signed)
 You have received an injection of steroids into the joint. 15% of patients will have increased pain within the 24 hours postinjection.   This is transient and will go away.   We recommend that you use ice packs on the injection site for 20 minutes every 2 hours and extra strength Tylenol 2 tablets every 8 as needed until the pain resolves.  If you continue to have pain after taking the Tylenol and using the ice please call the office for further instructions.

## 2024-01-01 NOTE — Progress Notes (Signed)
   There were no vitals taken for this visit.  There is no height or weight on file to calculate BMI.  Chief Complaint  Patient presents with   Knee Pain    Bilateral- L>R    No diagnosis found.    Worse- hurts more at night also getting ready to have epidural injection in back

## 2024-01-03 ENCOUNTER — Other Ambulatory Visit: Payer: Self-pay | Admitting: Family Medicine

## 2024-01-08 ENCOUNTER — Other Ambulatory Visit: Payer: Self-pay | Admitting: Family Medicine

## 2024-01-11 ENCOUNTER — Other Ambulatory Visit: Payer: Self-pay

## 2024-01-11 ENCOUNTER — Telehealth (HOSPITAL_COMMUNITY): Payer: Self-pay | Admitting: Physical Therapy

## 2024-01-11 ENCOUNTER — Encounter (HOSPITAL_COMMUNITY): Admitting: Physical Therapy

## 2024-01-11 DIAGNOSIS — I129 Hypertensive chronic kidney disease with stage 1 through stage 4 chronic kidney disease, or unspecified chronic kidney disease: Secondary | ICD-10-CM

## 2024-01-11 MED ORDER — ONETOUCH DELICA PLUS LANCET33G MISC: 0 refills | Status: DC

## 2024-01-11 NOTE — Telephone Encounter (Signed)
 Pt did not show for appt.  Pt cancelled last several appts and has not been here since 12/25/23.  Noted she had appt with Dr Phyllis Breeze on 5/19 and received Lt knee injection.  Called pt regarding missed appt, however therapist could not hear full conversation as call was going in and out.  Will keep the next scheduled appt at this time.  Lorenso Romance, PTA/CLT [facility] Ph: [phone]

## 2024-01-15 ENCOUNTER — Other Ambulatory Visit: Payer: Self-pay | Admitting: Family Medicine

## 2024-01-17 ENCOUNTER — Other Ambulatory Visit: Payer: Self-pay | Admitting: Cardiology

## 2024-01-18 ENCOUNTER — Ambulatory Visit (HOSPITAL_COMMUNITY): Attending: Surgery

## 2024-01-18 DIAGNOSIS — M5459 Other low back pain: Secondary | ICD-10-CM

## 2024-01-18 DIAGNOSIS — M6281 Muscle weakness (generalized): Secondary | ICD-10-CM

## 2024-01-18 DIAGNOSIS — M5386 Other specified dorsopathies, lumbar region: Secondary | ICD-10-CM | POA: Diagnosis not present

## 2024-01-18 DIAGNOSIS — M4316 Spondylolisthesis, lumbar region: Secondary | ICD-10-CM

## 2024-01-18 NOTE — Therapy (Signed)
 OUTPATIENT PHYSICAL THERAPY THORACOLUMBAR TREATMENT   Patient Name: [name] [name] MRN: [id number] DOB:April 21, 1946, 78 y.o., female Today's Date: 01/18/2024  END OF SESSION:  PT End of Session - 01/18/24 1150     Visit Number 3    Number of Visits 6    Date for PT Re-Evaluation 02/01/24    Authorization Type [employer] P[employer]    Authorization Time Period No auth, no limit    Progress Note Due on Visit 6    PT Start Time 1150    PT Stop Time 1229    PT Time Calculation (min) 39 min    Activity Tolerance Patient tolerated treatment well    Behavior During Therapy WFL for tasks assessed/performed             Past Medical History:  Diagnosis Date   Acute hypoxemic respiratory failure due to COVID-19 (HCC) 06/29/2020   Anxiety    Aortic stenosis    Arthritis    Biventricular ICD (implantable cardioverter-defibrillator) in place    March 2021, [employer]  -Dr. 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   23 mm Edwards Sapien 3 transcatheter heart valve placed via open right transfemoral approach   Symptomatic PVCs    Past Surgical History:  Procedure Laterality Date   BIV ICD INSERTION CRT-D N/A 10/28/2019   Procedure: BIV ICD INSERTION CRT-D;  Surgeon: Tammie Fall, MD;  Location: [facility] INVASIVE CV LAB;  Service: Cardiovascular;  Laterality: N/A;   CARDIAC CATHETERIZATION     CESAREAN SECTION  1987    FLEXIBLE BRONCHOSCOPY N/A 01/14/2015   Procedure: FLEXIBLE BRONCHOSCOPY;  Surgeon: Zelphia Higashi, MD;  Location: [facility] OR;  Service: Thoracic;  Laterality: N/A;   LEFT AND RIGHT HEART CATHETERIZATION WITH CORONARY ANGIOGRAM N/A 12/05/2011   Procedure: LEFT AND RIGHT HEART CATHETERIZATION WITH CORONARY ANGIOGRAM;  Surgeon: Odie Benne, MD;  Location: [facility] CATH LAB;  Service: Cardiovascular;  Laterality: N/A;   LEFT AND RIGHT HEART CATHETERIZATION WITH CORONARY ANGIOGRAM N/A 05/23/2014   Procedure: LEFT AND RIGHT HEART CATHETERIZATION WITH CORONARY ANGIOGRAM;  Surgeon: Odie Benne, MD;  Location: [facility] CATH LAB;  Service: Cardiovascular;  Laterality: N/A;   MULTIPLE EXTRACTIONS WITH ALVEOLOPLASTY N/A 02/27/2014   Procedure: Extraction of tooth #'s 2,4,5,6,7,8,9,10,11,12, 22, 23, 24, 25, 26, 28 with alveoloplasty and bilateral mandibular tori reductions;  Surgeon: Carol Chroman, DDS;  Location: [facility] OR;  Service: Oral Surgery;  Laterality: N/A;   RIGID BRONCHOSCOPY N/A 01/14/2015   Procedure: RIGID BRONCHOSCOPY with Removal Of Foreign Body ;  Surgeon: Zelphia Higashi, MD;  Location: [facility] OR;  Service: Thoracic;  Laterality: N/A;   TEE WITHOUT CARDIOVERSION N/A 03/24/2015   Procedure: TRANSESOPHAGEAL ECHOCARDIOGRAM (TEE);  Surgeon: Odie Benne, MD;  Location: [facility] OR;  Service: Open Heart Surgery;  Laterality: N/A;   TRANSCATHETER AORTIC VALVE  REPLACEMENT, TRANSFEMORAL Bilateral 03/24/2015   Procedure: TRANSCATHETER AORTIC VALVE REPLACEMENT, TRANSFEMORAL;  Surgeon: Odie Benne, MD;  Location: [facility] OR;  Service: Open Heart Surgery;  Laterality: Bilateral;   TUBAL LIGATION  1987   VIDEO BRONCHOSCOPY Bilateral 06/05/2014   Procedure: VIDEO BRONCHOSCOPY WITHOUT FLUORO;  Surgeon: Marine Sia, MD;  Location: [facility] ENDOSCOPY;  Service: Cardiopulmonary;  Laterality: Bilateral;   Patient Active Problem List   Diagnosis Date Noted   Spondylolisthesis of lumbar region  11/22/2023   Viral illness 07/20/2023   Encounter for immunization 06/19/2023   Back pain with radiculopathy 06/19/2023   Encounter for examination following treatment at hospital 06/19/2023   Grief at loss of child 04/25/2023   Pain due to onychomycosis of toenails of both feet 11/23/2022   Type 2 diabetes mellitus with vascular disease (HCC) 11/23/2022   Encounter for Medicare annual examination with abnormal findings 12/07/2021   Neck pain 02/08/2021   Cervicogenic headache 02/08/2021   CKD (chronic kidney disease), stage IV (HCC) 06/29/2020   Hyperlipidemia associated with type 2 diabetes mellitus (HCC) 06/29/2020   Benign essential hypertension 05/27/2020   Chronic combined systolic and diastolic heart failure (HCC) 05/27/2020   Generalized osteoarthritis 07/26/2019   Osteoarthritis of both knees 03/16/2017   History of aortic valve replacement 03/24/2015   Hyperlipidemia LDL goal <70 05/24/2014   Severe aortic stenosis 05/23/2014   Solitary pulmonary nodule 02/10/2014   Left bundle-branch block 12/03/2013   Type 2 DM with CKD stage 4 and hypertension (HCC) 05/21/2013   Seasonal allergies 02/22/2012   Cardiomyopathy (HCC) 10/14/2011   Obesity (BMI 30.0-34.9) 10/24/2007    PCP: Towanda Fret, MD  REFERRING PROVIDER: Assunta Lax, PA-C  REFERRING DIAG:  lumbar spondylilisthesis    Rationale for Evaluation and Treatment: Rehabilitation  THERAPY DIAG:  Other low back pain  Decreased ROM of lumbar spine  Muscle weakness (generalized)  Spondylolisthesis of lumbar region  ONSET DATE: 6 months  SUBJECTIVE:                                                                                                                                                                                           SUBJECTIVE STATEMENT: Pt reporting back only hurting at night still. "Just aggravating pain". Been walking to the mailbox and doing work in the house fine. No pain  right now.  Patient reports back pain started hurting randomly 6 months ago. Reports pain as being in thoracic and lumbar spine and travels down to R leg all the way to foot at times. Feels like a burning sensation down the leg, reports back pain as a nagging pain/ache.  Reports most pain at night time, with sitting or laying for long periods of time. Takes tylenol  and helps some.  PERTINENT HISTORY:  Has a fibrillator, 2020 Artifical heart valve, 2015 Bilateral arthritis in knees   PAIN:  Are you having pain? No  PRECAUTIONS: None  RED FLAGS: None   WEIGHT BEARING RESTRICTIONS: No  FALLS:  Has patient fallen in last 6 months? No  LIVING ENVIRONMENT: Has following equipment at home: Single point cane and Rollator Uses SPC most  OCCUPATION: Retired  PLOF: Independent  PATIENT GOALS: "Have less soreness and pain"  NEXT MD VISIT: N/A  OBJECTIVE:  Note: Objective measures were completed at Evaluation unless otherwise noted.  DIAGNOSTIC FINDINGS:  IMPRESSION: 1. Grade 1 anterolisthesis of L4 on L5 and L5 on S1 secondary to facet arthropathy. 2. Moderate bilateral neuroforaminal stenosis at L4-L5 and L5-S1. 3. Mild canal stenosis at L4-L5.  PATIENT SURVEYS:  Modified Oswestry Low Back Pain Disability Questionnaire: 18 / 50 = 36.0 %  COGNITION: Overall cognitive status: Within functional limits for tasks assessed     SENSATION: WFL  POSTURE: rounded shoulders and forward head  PALPATION: TTP t/o lumbar spine. Soreness and tightness t/o lumbar and thoracic paraspinals  LUMBAR ROM:   AROM eval  Flexion To ankles  Extension 25% avail *  Right lateral flexion To knee jt  Left lateral flexion To knee jt  Right rotation 15% avail  Left rotation 15% avail   (Blank rows = not tested) * = painful  LOWER EXTREMITY ROM:     Active  Right eval Left eval  Hip flexion    Hip extension    Hip abduction    Hip adduction    Hip internal rotation    Hip external  rotation    Knee flexion    Knee extension    Ankle dorsiflexion    Ankle plantarflexion    Ankle inversion    Ankle eversion     (Blank rows = not tested)  LOWER EXTREMITY MMT:    MMT Right eval Left eval  Hip flexion 4+ * in knees 4+ * in knees   Hip extension 3+ * in low back  3+ * in low back   Hip abduction 4- 4-  Hip adduction    Hip internal rotation    Hip external rotation    Knee flexion 4- 4-  Knee extension 4- 4-  Ankle dorsiflexion 5 5  Ankle plantarflexion    Ankle inversion    Ankle eversion     (Blank rows = not tested)  * = painful  LUMBAR SPECIAL TESTS:  Straight leg raise test: Negative and Quadrant test: Negative  FUNCTIONAL TESTS:  30 seconds chair stand test  : 10 STS   12/25/23: : 218', no pain in low back, little pain in LE  GAIT: Distance walked: 100 Assistive device utilized: Single point cane Level of assistance: Modified independence Comments: Pt ambulates with SPC, antalgic-like gait, forward lean, mild hip hiking bilat., dec knee flex, dec hip ext/flex bilaterally.  TREATMENT DATE:  01/18/24 LTR, 2x10 SKTC, 10x each LE TA contract, pt in supine w/ the following: Heel slides, 10x each LE  March, 10x  Hip abd, YTB, 2x10  Hip add, Yellow ball, 2x10, 5"  Clamshells, 2x10 Bridges, 2x10 NuStep, seat 10, lvl 1 - 4', lvl 2 - 6'  12/25/23:  Review of HEP and goals test LTR, 10x SKTC, 10x each LE DKTC, 5x Straight Leg raise Test Piriformis stretch,  RLE 2x30" Hamstring stretch, RLE, 30" Core Set with physioball, UE pushing ball into mat table, 10" holds   12/21/23: PT eval and HEP                                                                                                                                 PATIENT EDUCATION:  Education details: PT evaluation, objective findings, POC, Importance of HEP, Precautions, Clinic policies, Anatomy Person educated: Patient Education method: Explanation and Demonstration Education  comprehension: verbalized understanding and returned demonstration  HOME EXERCISE PROGRAM: Access Code: [email or web address] URL: [email or web address] Date: 12/21/2023 Prepared by: Virgia Griffins Powell-Butler  Exercises - Supine Lower Trunk Rotation  - 2 x daily - 7 x weekly - 2 sets - 10 reps - Hooklying Single Knee to Chest Stretch  - 2 x daily - 7 x weekly - 2 sets - 10 reps - Supine Double Knee to Chest  - 2 x daily - 7 x weekly - 2 sets - 10 reps - Sit to Stand  - 2 x daily - 7 x weekly - 2 sets - 10 reps   Exercises - Supine Piriformis Stretch with Foot on Ground  - 2 x daily - 7 x weekly - 3 sets - 30 hold - Seated Piriformis Stretch with Trunk Bend  - 2 x daily - 7 x weekly - 3 sets - 30 hold - Seated Hamstring Stretch  - 2 x daily - 7 x weekly - 3 sets - 30 hold    Exercises - Supine March  - 2 x daily - 7 x weekly - 2 sets - 10 reps - Supine Bridge  - 2 x daily - 7 x weekly - 2 sets - 10 reps - Supine Hip Adduction Isometric with Ball  - 2 x daily - 7 x weekly - 2 sets - 10 reps - 5 hold - Hooklying Clamshell with Resistance  - 2 x daily - 7 x weekly - 2 sets - 10 reps  ASSESSMENT:  CLINICAL IMPRESSION: Patient tolerated session well. Patient reporting non-compliance with HEP. Stressed the importance of daily compliance of HEP and ambulation to see benefits of physical therapy. Began with review of some HEP, pt requiring some verbal cueing for form. Introduction and addition of hip and core strengthening during session and to HEP. Pt reporting muscle fatigue throughout but no excessive discomfort or pain. Consistent reminders throughout session for TA contraction w/ exercises. Patient will benefit from continued skilled physical therapy in order to address deficits to decrease daily/nightly pain as well as improve overall function.      Patient is a 78 y.o. female who was seen today for physical therapy evaluation and treatment for  lumbar spondylilisthesis  . On this date,  patient demonstrates dec LE strength, dec lumbar ROM, increased pain with LE MMT testing, tenderness when palpating lumbar spinous processes, and tightness and soreness in thoracic and lumbar paraspinal musculature. Unable to reproduce  radiating pain into RLE on this date as pt describes pain most at nights or with prolonged positions but familiar low back pain is reproduced with grade II-III CPAs to lumbar spine. Patient will benefit from skilled physical therapy in order to address the above deficits to improve independence, function, and QOL.    OBJECTIVE IMPAIRMENTS: Abnormal gait, decreased activity tolerance, decreased endurance, decreased mobility, difficulty walking, decreased ROM, decreased strength, improper body mechanics, postural dysfunction, and pain.   ACTIVITY LIMITATIONS: carrying, lifting, bending, sitting, standing, squatting, stairs, transfers, bed mobility, and reach over head  PARTICIPATION LIMITATIONS: cleaning, laundry, driving, community activity, and yard work  [facility] POTENTIAL: Good  CLINICAL DECISION MAKING: Stable/uncomplicated  EVALUATION COMPLEXITY: Low   GOALS: Goals reviewed with patient? Yes  SHORT TERM GOALS: Target date: 01/04/24 Patient will be independent with performance of HEP to demonstrate adequate self management of symptoms.  Baseline:  Goal status: INITIAL  2.   Patient will report at least a 25% improvement with function or pain overall since beginning PT. Baseline:  Goal status: INITIAL   LONG TERM GOALS: Target date: 02/01/24 Patient will improve Oswestry score by   10  points in order to improve self-perceived disability and overall function.  Baseline: Goal status: INITIAL   2.  Patient will improve bilat hip ext MMT  to at least 4-  for improved hip ext during gait. Baseline: Goal status: INITIAL   3.  Patient will improve  30 sec chair rise  test to at least 12 STS  in order to improve LE strength and endurance for improved  gait. Baseline:  Goal status: INITIAL   4.  Patient will report overall 50% improvement since beginning PT. Baseline:  Goal status: INITIAL   PLAN:  PT FREQUENCY: 1-2x/week  PT DURATION: 6 weeks  PLANNED INTERVENTIONS: 97164- PT Re-evaluation, 97110-Therapeutic exercises, 97530- Therapeutic activity, 97112- Neuromuscular re-education, 97535- Self Care, 96045- Manual therapy, [phone]- Gait training, [phone]- Electrical stimulation (manual), Patient/Family education, Balance training, Stair training, Dry Needling, Joint mobilization, Spinal mobilization, DME instructions, and Moist heat.  PLAN FOR NEXT SESSION: progress lumbar and thoracic mobility, add core strengthening to HEP, LE strengthening    12:35 PM, 01/18/24 Marysue Sola, PT, DPT [facility]

## 2024-01-22 ENCOUNTER — Ambulatory Visit (INDEPENDENT_AMBULATORY_CARE_PROVIDER_SITE_OTHER): Payer: PPO

## 2024-01-22 DIAGNOSIS — I5020 Unspecified systolic (congestive) heart failure: Secondary | ICD-10-CM

## 2024-01-22 DIAGNOSIS — I428 Other cardiomyopathies: Secondary | ICD-10-CM

## 2024-01-23 LAB — CUP PACEART REMOTE DEVICE CHECK
Battery Remaining Longevity: 46 mo
Battery Voltage: 2.97 V
Brady Statistic AP VP Percent: 3.55 %
Brady Statistic AP VS Percent: 0.1 %
Brady Statistic AS VP Percent: 95 %
Brady Statistic AS VS Percent: 1.36 %
Brady Statistic RA Percent Paced: 3.65 %
Brady Statistic RV Percent Paced: 0.16 %
Date Time Interrogation Session: 20250609031805
HighPow Impedance: 65 Ohm
Implantable Lead Connection Status: 753985
Implantable Lead Connection Status: 753985
Implantable Lead Connection Status: 753985
Implantable Lead Implant Date: 20210315
Implantable Lead Implant Date: 20210315
Implantable Lead Implant Date: 20210315
Implantable Lead Location: 753858
Implantable Lead Location: 753859
Implantable Lead Location: 753860
Implantable Lead Model: 4598
Implantable Lead Model: 5076
Implantable Lead Model: 6935
Implantable Pulse Generator Implant Date: 20210315
Lead Channel Impedance Value: 1007 Ohm
Lead Channel Impedance Value: 195.429
Lead Channel Impedance Value: 198.837
Lead Channel Impedance Value: 225.849
Lead Channel Impedance Value: 270.508
Lead Channel Impedance Value: 277.083
Lead Channel Impedance Value: 342 Ohm
Lead Channel Impedance Value: 342 Ohm
Lead Channel Impedance Value: 361 Ohm
Lead Channel Impedance Value: 418 Ohm
Lead Channel Impedance Value: 456 Ohm
Lead Channel Impedance Value: 475 Ohm
Lead Channel Impedance Value: 608 Ohm
Lead Channel Impedance Value: 665 Ohm
Lead Channel Impedance Value: 665 Ohm
Lead Channel Impedance Value: 722 Ohm
Lead Channel Impedance Value: 931 Ohm
Lead Channel Impedance Value: 988 Ohm
Lead Channel Pacing Threshold Amplitude: 0.375 V
Lead Channel Pacing Threshold Amplitude: 0.75 V
Lead Channel Pacing Threshold Amplitude: 0.875 V
Lead Channel Pacing Threshold Pulse Width: 0.4 ms
Lead Channel Pacing Threshold Pulse Width: 0.4 ms
Lead Channel Pacing Threshold Pulse Width: 0.4 ms
Lead Channel Sensing Intrinsic Amplitude: 11.75 mV
Lead Channel Sensing Intrinsic Amplitude: 11.75 mV
Lead Channel Sensing Intrinsic Amplitude: 3.375 mV
Lead Channel Sensing Intrinsic Amplitude: 3.375 mV
Lead Channel Setting Pacing Amplitude: 1.5 V
Lead Channel Setting Pacing Amplitude: 1.5 V
Lead Channel Setting Pacing Amplitude: 2 V
Lead Channel Setting Pacing Pulse Width: 0.4 ms
Lead Channel Setting Pacing Pulse Width: 0.4 ms
Lead Channel Setting Sensing Sensitivity: 0.3 mV
Zone Setting Status: 755011
Zone Setting Status: 755011

## 2024-01-25 ENCOUNTER — Ambulatory Visit (HOSPITAL_COMMUNITY): Admitting: Physical Therapy

## 2024-01-25 DIAGNOSIS — M5386 Other specified dorsopathies, lumbar region: Secondary | ICD-10-CM

## 2024-01-25 DIAGNOSIS — M6281 Muscle weakness (generalized): Secondary | ICD-10-CM

## 2024-01-25 DIAGNOSIS — M5459 Other low back pain: Secondary | ICD-10-CM

## 2024-01-25 DIAGNOSIS — M4316 Spondylolisthesis, lumbar region: Secondary | ICD-10-CM

## 2024-01-25 NOTE — Therapy (Signed)
 OUTPATIENT PHYSICAL THERAPY THORACOLUMBAR TREATMENT   Patient Name: [name] [name] MRN: [id number] DOB:07-18-1946, 78 y.o., female Today's Date: 01/25/2024  END OF SESSION:  PT End of Session - 01/25/24 0951     Visit Number 4    Number of Visits 6    Date for PT Re-Evaluation 02/01/24    Authorization Type [employer] P[employer]    Authorization Time Period No auth, no limit    Progress Note Due on Visit 6    PT Start Time 0935    PT Stop Time 1015    PT Time Calculation (min) 40 min    Activity Tolerance Patient tolerated treatment well    Behavior During Therapy [facility] for tasks assessed/performed           Past Medical History:  Diagnosis Date   Acute hypoxemic respiratory failure due to COVID-19 (HCC) 06/29/2020   Anxiety    Aortic stenosis    Arthritis    Biventricular ICD (implantable cardioverter-defibrillator) in place    March 2021, [employer]  -Dr. 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   23 mm Edwards Sapien 3 transcatheter heart valve placed via open right transfemoral approach   Symptomatic PVCs    Past Surgical History:  Procedure Laterality Date   BIV ICD INSERTION CRT-D N/A 10/28/2019   Procedure: BIV ICD INSERTION CRT-D;  Surgeon: Tammie Fall, MD;  Location: [facility] INVASIVE CV LAB;  Service: Cardiovascular;  Laterality: N/A;   CARDIAC CATHETERIZATION     CESAREAN SECTION  1987   FLEXIBLE  BRONCHOSCOPY N/A 01/14/2015   Procedure: FLEXIBLE BRONCHOSCOPY;  Surgeon: Zelphia Higashi, MD;  Location: [facility] OR;  Service: Thoracic;  Laterality: N/A;   LEFT AND RIGHT HEART CATHETERIZATION WITH CORONARY ANGIOGRAM N/A 12/05/2011   Procedure: LEFT AND RIGHT HEART CATHETERIZATION WITH CORONARY ANGIOGRAM;  Surgeon: Odie Benne, MD;  Location: [facility] CATH LAB;  Service: Cardiovascular;  Laterality: N/A;   LEFT AND RIGHT HEART CATHETERIZATION WITH CORONARY ANGIOGRAM N/A 05/23/2014   Procedure: LEFT AND RIGHT HEART CATHETERIZATION WITH CORONARY ANGIOGRAM;  Surgeon: Odie Benne, MD;  Location: [facility] CATH LAB;  Service: Cardiovascular;  Laterality: N/A;   MULTIPLE EXTRACTIONS WITH ALVEOLOPLASTY N/A 02/27/2014   Procedure: Extraction of tooth #'s 2,4,5,6,7,8,9,10,11,12, 22, 23, 24, 25, 26, 28 with alveoloplasty and bilateral mandibular tori reductions;  Surgeon: Carol Chroman, DDS;  Location: [facility] OR;  Service: Oral Surgery;  Laterality: N/A;   RIGID BRONCHOSCOPY N/A 01/14/2015   Procedure: RIGID BRONCHOSCOPY with Removal Of Foreign Body ;  Surgeon: Zelphia Higashi, MD;  Location: [facility] OR;  Service: Thoracic;  Laterality: N/A;   TEE WITHOUT CARDIOVERSION N/A 03/24/2015   Procedure: TRANSESOPHAGEAL ECHOCARDIOGRAM (TEE);  Surgeon: Odie Benne, MD;  Location[facility] OR;  Service: Open Heart Surgery;  Laterality: N/A;   TRANSCATHETER AORTIC VALVE REPLACEMENT, TRANSFEMORAL  Bilateral 03/24/2015   Procedure: TRANSCATHETER AORTIC VALVE REPLACEMENT, TRANSFEMORAL;  Surgeon: Odie Benne, MD;  Location: [facility] OR;  Service: Open Heart Surgery;  Laterality: Bilateral;   TUBAL LIGATION  1987   VIDEO BRONCHOSCOPY Bilateral 06/05/2014   Procedure: VIDEO BRONCHOSCOPY WITHOUT FLUORO;  Surgeon: Marine Sia, MD;  Location: [facility] ENDOSCOPY;  Service: Cardiopulmonary;  Laterality: Bilateral;   Patient Active Problem List   Diagnosis Date Noted   Spondylolisthesis of lumbar region 11/22/2023    Viral illness 07/20/2023   Encounter for immunization 06/19/2023   Back pain with radiculopathy 06/19/2023   Encounter for examination following treatment at hospital 06/19/2023   Grief at loss of child 04/25/2023   Pain due to onychomycosis of toenails of both feet 11/23/2022   Type 2 diabetes mellitus with vascular disease (HCC) 11/23/2022   Encounter for Medicare annual examination with abnormal findings 12/07/2021   Neck pain 02/08/2021   Cervicogenic headache 02/08/2021   CKD (chronic kidney disease), stage IV (HCC) 06/29/2020   Hyperlipidemia associated with type 2 diabetes mellitus (HCC) 06/29/2020   Benign essential hypertension 05/27/2020   Chronic combined systolic and diastolic heart failure (HCC) 05/27/2020   Generalized osteoarthritis 07/26/2019   Osteoarthritis of both knees 03/16/2017   History of aortic valve replacement 03/24/2015   Hyperlipidemia LDL goal <70 05/24/2014   Severe aortic stenosis 05/23/2014   Solitary pulmonary nodule 02/10/2014   Left bundle-branch block 12/03/2013   Type 2 DM with CKD stage 4 and hypertension (HCC) 05/21/2013   Seasonal allergies 02/22/2012   Cardiomyopathy (HCC) 10/14/2011   Obesity (BMI 30.0-34.9) 10/24/2007    PCP: Towanda Fret, MD  REFERRING PROVIDER: Assunta Lax, PA-C  REFERRING DIAG:  lumbar spondylilisthesis    Rationale for Evaluation and Treatment: Rehabilitation  THERAPY DIAG:  Other low back pain  Decreased ROM of lumbar spine  Muscle weakness (generalized)  Spondylolisthesis of lumbar region  ONSET DATE: 6 months  SUBJECTIVE:                                                                                                                                                                                           SUBJECTIVE STATEMENT: Pt reports she is doing her exercises and walking to the mailbox and inside the house.  Completing her house work without issues. No pain right now.  Getting  a spinal injection on 6/30.    Patient reports back pain started hurting randomly 6 months ago. Reports pain as being in thoracic and lumbar spine and travels down to R leg all the way to foot at times. Feels like a burning sensation down the leg,  reports back pain as a nagging pain/ache. Reports most pain at night time, with sitting or laying for long periods of time. Takes tylenol  and helps some.  PERTINENT HISTORY:  Has a fibrillator, 2020 Artifical heart valve, 2015 Bilateral arthritis in knees   PAIN:  Are you having pain? No  PRECAUTIONS: None  RED FLAGS: None   WEIGHT BEARING RESTRICTIONS: No  FALLS:  Has patient fallen in last 6 months? No  LIVING ENVIRONMENT: Has following equipment at home: Single point cane and Rollator Uses SPC most  OCCUPATION: Retired  PLOF: Independent  PATIENT GOALS: Have less soreness and pain  NEXT MD VISIT: N/A  OBJECTIVE:  Note: Objective measures were completed at Evaluation unless otherwise noted.  DIAGNOSTIC FINDINGS:  IMPRESSION: 1. Grade 1 anterolisthesis of L4 on L5 and L5 on S1 secondary to facet arthropathy. 2. Moderate bilateral neuroforaminal stenosis at L4-L5 and L5-S1. 3. Mild canal stenosis at L4-L5.  PATIENT SURVEYS:  Modified Oswestry Low Back Pain Disability Questionnaire: 18 / 50 = 36.0 %  COGNITION: Overall cognitive status: Within functional limits for tasks assessed     SENSATION: WFL  POSTURE: rounded shoulders and forward head  PALPATION: TTP t/o lumbar spine. Soreness and tightness t/o lumbar and thoracic paraspinals  LUMBAR ROM:   AROM eval  Flexion To ankles  Extension 25% avail *  Right lateral flexion To knee jt  Left lateral flexion To knee jt  Right rotation 15% avail  Left rotation 15% avail   (Blank rows = not tested) * = painful  LOWER EXTREMITY ROM:     Active  Right eval Left eval  Hip flexion    Hip extension    Hip abduction    Hip adduction    Hip internal rotation     Hip external rotation    Knee flexion    Knee extension    Ankle dorsiflexion    Ankle plantarflexion    Ankle inversion    Ankle eversion     (Blank rows = not tested)  LOWER EXTREMITY MMT:    MMT Right eval Left eval  Hip flexion 4+ * in knees 4+ * in knees   Hip extension 3+ * in low back  3+ * in low back   Hip abduction 4- 4-  Hip adduction    Hip internal rotation    Hip external rotation    Knee flexion 4- 4-  Knee extension 4- 4-  Ankle dorsiflexion 5 5  Ankle plantarflexion    Ankle inversion    Ankle eversion     (Blank rows = not tested)  * = painful  LUMBAR SPECIAL TESTS:  Straight leg raise test: Negative and Quadrant test: Negative  FUNCTIONAL TESTS:  30 seconds chair stand test  : 10 STS   12/25/23: : 218', no pain in low back, little pain in LE  GAIT: Distance walked: 100 Assistive device utilized: Single point cane Level of assistance: Modified independence Comments: Pt ambulates with SPC, antalgic-like gait, forward lean, mild hip hiking bilat., dec knee flex, dec hip ext/flex bilaterally.  TREATMENT DATE:  01/18/24 Nustep seat 7 UE/LE level 3 atl beach 5 minutes Standing:  Hip abduction 2X10 Hip extension 2X10 Alternating march 2X10 Sit to stands no UE 10X Supine:  LTR, 2x10 SKTC, 10x each LE Heel slides, 10x each LE  March, 10x2  Hip add, Yellow ball, 2x10, 5  Clamshells, 2x10 with YTB Bridges, 2x10  01/18/24 LTR, 2x10 SKTC, 10x each LE TA contract,  pt in supine w/ the following: Heel slides, 10x each LE  March, 10x  Hip abd, YTB, 2x10  Hip add, Yellow ball, 2x10, 5  Clamshells, 2x10 Bridges, 2x10 NuStep, seat 10, lvl 1 - 4', lvl 2 - 6'  12/25/23:  Review of HEP and goals test LTR, 10x SKTC, 10x each LE DKTC, 5x Straight Leg raise Test Piriformis stretch, RLE 2x30 Hamstring stretch, RLE, 30 Core Set with physioball, UE pushing ball into mat table, 10 holds   12/21/23: PT eval and HEP                                                                                                                                  PATIENT EDUCATION:  Education details: PT evaluation, objective findings, POC, Importance of HEP, Precautions, Clinic policies, Anatomy Person educated: Patient Education method: Explanation and Demonstration Education comprehension: verbalized understanding and returned demonstration  HOME EXERCISE PROGRAM: Access Code: [email or web address] URL: [email or web address] Date: 12/21/2023 Prepared by: Virgia Griffins Powell-Butler  Exercises - Supine Lower Trunk Rotation  - 2 x daily - 7 x weekly - 2 sets - 10 reps - Hooklying Single Knee to Chest Stretch  - 2 x daily - 7 x weekly - 2 sets - 10 reps - Supine Double Knee to Chest  - 2 x daily - 7 x weekly - 2 sets - 10 reps - Sit to Stand  - 2 x daily - 7 x weekly - 2 sets - 10 reps   Exercises - Supine Piriformis Stretch with Foot on Ground  - 2 x daily - 7 x weekly - 3 sets - 30 hold - Seated Piriformis Stretch with Trunk Bend  - 2 x daily - 7 x weekly - 3 sets - 30 hold - Seated Hamstring Stretch  - 2 x daily - 7 x weekly - 3 sets - 30 hold    Exercises - Supine March  - 2 x daily - 7 x weekly - 2 sets - 10 reps - Supine Bridge  - 2 x daily - 7 x weekly - 2 sets - 10 reps - Supine Hip Adduction Isometric with Ball  - 2 x daily - 7 x weekly - 2 sets - 10 reps - 5 hold - Hooklying Clamshell with Resistance  - 2 x daily - 7 x weekly - 2 sets - 10 reps  ASSESSMENT:  CLINICAL IMPRESSION: Began with nustep and progressed to standing hip and LE strengthening exercises.  Pt with cues to complete slowly as using momentum.  Educated on importance of completing therex slow and control for most benefit.  Yellow band added to clams in supine.  Cues to maintain core stabilization with mat activities and increase hold times where needed.  Pt without any complaints of pain or issues during session today.  Patient will benefit from continued skilled  physical therapy in order to address deficits to decrease daily/nightly pain as well as  improve overall function.     Patient is a 78 y.o. female who was seen today for physical therapy evaluation and treatment for  lumbar spondylilisthesis  . On this date, patient demonstrates dec LE strength, dec lumbar ROM, increased pain with LE MMT testing, tenderness when palpating lumbar spinous processes, and tightness and soreness in thoracic and lumbar paraspinal musculature. Unable to reproduce radiating pain into RLE on this date as pt describes pain most at nights or with prolonged positions but familiar low back pain is reproduced with grade II-III CPAs to lumbar spine. Patient will benefit from skilled physical therapy in order to address the above deficits to improve independence, function, and QOL.    OBJECTIVE IMPAIRMENTS: Abnormal gait, decreased activity tolerance, decreased endurance, decreased mobility, difficulty walking, decreased ROM, decreased strength, improper body mechanics, postural dysfunction, and pain.   ACTIVITY LIMITATIONS: carrying, lifting, bending, sitting, standing, squatting, stairs, transfers, bed mobility, and reach over head  PARTICIPATION LIMITATIONS: cleaning, laundry, driving, community activity, and yard work  [facility] POTENTIAL: Good  CLINICAL DECISION MAKING: Stable/uncomplicated  EVALUATION COMPLEXITY: Low   GOALS: Goals reviewed with patient? Yes  SHORT TERM GOALS: Target date: 01/04/24 Patient will be independent with performance of HEP to demonstrate adequate self management of symptoms.  Baseline:  Goal status: INITIAL  2.   Patient will report at least a 25% improvement with function or pain overall since beginning PT. Baseline:  Goal status: INITIAL   LONG TERM GOALS: Target date: 02/01/24 Patient will improve Oswestry score by   10  points in order to improve self-perceived disability and overall function.  Baseline: Goal status: INITIAL   2.   Patient will improve bilat hip ext MMT  to at least 4-  for improved hip ext during gait. Baseline: Goal status: INITIAL   3.  Patient will improve  30 sec chair rise  test to at least 12 STS  in order to improve LE strength and endurance for improved gait. Baseline:  Goal status: INITIAL   4.  Patient will report overall 50% improvement since beginning PT. Baseline:  Goal status: INITIAL   PLAN:  PT FREQUENCY: 1-2x/week  PT DURATION: 6 weeks  PLANNED INTERVENTIONS: 97164- PT Re-evaluation, 97110-Therapeutic exercises, 97530- Therapeutic activity, 97112- Neuromuscular re-education, 97535- Self Care, 16109- Manual therapy, [phone]- Gait training, [phone]- Electrical stimulation (manual), Patient/Family education, Balance training, Stair training, Dry Needling, Joint mobilization, Spinal mobilization, DME instructions, and Moist heat.  PLAN FOR NEXT SESSION: progress lumbar and thoracic mobility.   9:52 AM, 01/25/24 Lorenso Romance, PTA/CLT [facility] Ph: [phone]

## 2024-01-26 ENCOUNTER — Ambulatory Visit: Payer: Self-pay | Admitting: Internal Medicine

## 2024-01-26 ENCOUNTER — Other Ambulatory Visit: Payer: Self-pay | Admitting: Family Medicine

## 2024-01-29 ENCOUNTER — Ambulatory Visit (HOSPITAL_COMMUNITY)

## 2024-01-29 ENCOUNTER — Encounter (HOSPITAL_COMMUNITY): Payer: Self-pay

## 2024-01-29 ENCOUNTER — Other Ambulatory Visit: Payer: Self-pay | Admitting: Family Medicine

## 2024-01-29 DIAGNOSIS — M5386 Other specified dorsopathies, lumbar region: Secondary | ICD-10-CM

## 2024-01-29 DIAGNOSIS — M4316 Spondylolisthesis, lumbar region: Secondary | ICD-10-CM

## 2024-01-29 DIAGNOSIS — M6281 Muscle weakness (generalized): Secondary | ICD-10-CM

## 2024-01-29 DIAGNOSIS — M5459 Other low back pain: Secondary | ICD-10-CM | POA: Diagnosis not present

## 2024-01-29 NOTE — Therapy (Signed)
 OUTPATIENT PHYSICAL THERAPY THORACOLUMBAR TREATMENT   Patient Name: [name] [name] MRN: [id number] DOB:05/10/1946, 78 y.o., female Today's Date: 01/29/2024  END OF SESSION:  PT End of Session - 01/29/24 0940     Visit Number 5    Number of Visits 9    Date for PT Re-Evaluation 02/28/24    Authorization Type [employer] P[employer]    Authorization Time Period No auth, no limit    Progress Note Due on Visit 9    PT Start Time 0940    PT Stop Time 1015    PT Time Calculation (min) 35 min    Activity Tolerance Patient tolerated treatment well    Behavior During Therapy WFL for tasks assessed/performed           Past Medical History:  Diagnosis Date   Acute hypoxemic respiratory failure due to COVID-19 (HCC) 06/29/2020   Anxiety    Aortic stenosis    Arthritis    Biventricular ICD (implantable cardioverter-defibrillator) in place    March 2021, [employer]  -Dr. 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   23 mm Edwards Sapien 3 transcatheter heart valve placed via open right transfemoral approach   Symptomatic PVCs    Past Surgical History:  Procedure Laterality Date   BIV ICD INSERTION CRT-D N/A 10/28/2019   Procedure: BIV ICD INSERTION CRT-D;  Surgeon: Tammie Fall, MD;  Location: [facility] INVASIVE CV LAB;  Service: Cardiovascular;  Laterality: N/A;   CARDIAC CATHETERIZATION     CESAREAN SECTION  1987   FLEXIBLE  BRONCHOSCOPY N/A 01/14/2015   Procedure: FLEXIBLE BRONCHOSCOPY;  Surgeon: Zelphia Higashi, MD;  Location: [facility] OR;  Service: Thoracic;  Laterality: N/A;   LEFT AND RIGHT HEART CATHETERIZATION WITH CORONARY ANGIOGRAM N/A 12/05/2011   Procedure: LEFT AND RIGHT HEART CATHETERIZATION WITH CORONARY ANGIOGRAM;  Surgeon: Odie Benne, MD;  Location: [facility] CATH LAB;  Service: Cardiovascular;  Laterality: N/A;   LEFT AND RIGHT HEART CATHETERIZATION WITH CORONARY ANGIOGRAM N/A 05/23/2014   Procedure: LEFT AND RIGHT HEART CATHETERIZATION WITH CORONARY ANGIOGRAM;  Surgeon: Odie Benne, MD;  Location: [facility] CATH LAB;  Service: Cardiovascular;  Laterality: N/A;   MULTIPLE EXTRACTIONS WITH ALVEOLOPLASTY N/A 02/27/2014   Procedure: Extraction of tooth #'s 2,4,5,6,7,8,9,10,11,12, 22, 23, 24, 25, 26, 28 with alveoloplasty and bilateral mandibular tori reductions;  Surgeon: Carol Chroman, DDS;  Location: [facility] OR;  Service: Oral Surgery;  Laterality: N/A;   RIGID BRONCHOSCOPY N/A 01/14/2015   Procedure: RIGID BRONCHOSCOPY with Removal Of Foreign Body ;  Surgeon: Zelphia Higashi, MD;  Location: [facility] OR;  Service: Thoracic;  Laterality: N/A;   TEE WITHOUT CARDIOVERSION N/A 03/24/2015   Procedure: TRANSESOPHAGEAL ECHOCARDIOGRAM (TEE);  Surgeon: Odie Benne, MD;  Location: [facility] OR;  Service: Open Heart Surgery;  Laterality: N/A;   TRANSCATHETER AORTIC VALVE REPLACEMENT, TRANSFEMORAL  Bilateral 03/24/2015   Procedure: TRANSCATHETER AORTIC VALVE REPLACEMENT, TRANSFEMORAL;  Surgeon: Odie Benne, MD;  Location: [facility] OR;  Service: Open Heart Surgery;  Laterality: Bilateral;   TUBAL LIGATION  1987   VIDEO BRONCHOSCOPY Bilateral 06/05/2014   Procedure: VIDEO BRONCHOSCOPY WITHOUT FLUORO;  Surgeon: Marine Sia, MD;  Location: [facility] ENDOSCOPY;  Service: Cardiopulmonary;  Laterality: Bilateral;   Patient Active Problem List   Diagnosis Date Noted   Spondylolisthesis of lumbar region 11/22/2023    Viral illness 07/20/2023   Encounter for immunization 06/19/2023   Back pain with radiculopathy 06/19/2023   Encounter for examination following treatment at hospital 06/19/2023   Grief at loss of child 04/25/2023   Pain due to onychomycosis of toenails of both feet 11/23/2022   Type 2 diabetes mellitus with vascular disease (HCC) 11/23/2022   Encounter for Medicare annual examination with abnormal findings 12/07/2021   Neck pain 02/08/2021   Cervicogenic headache 02/08/2021   CKD (chronic kidney disease), stage IV (HCC) 06/29/2020   Hyperlipidemia associated with type 2 diabetes mellitus (HCC) 06/29/2020   Benign essential hypertension 05/27/2020   Chronic combined systolic and diastolic heart failure (HCC) 05/27/2020   Generalized osteoarthritis 07/26/2019   Osteoarthritis of both knees 03/16/2017   History of aortic valve replacement 03/24/2015   Hyperlipidemia LDL goal <70 05/24/2014   Severe aortic stenosis 05/23/2014   Solitary pulmonary nodule 02/10/2014   Left bundle-branch block 12/03/2013   Type 2 DM with CKD stage 4 and hypertension (HCC) 05/21/2013   Seasonal allergies 02/22/2012   Cardiomyopathy (HCC) 10/14/2011   Obesity (BMI 30.0-34.9) 10/24/2007    PCP: Towanda Fret, MD  REFERRING PROVIDER: Assunta Lax, PA-C  REFERRING DIAG:  lumbar spondylilisthesis    Rationale for Evaluation and Treatment: Rehabilitation  THERAPY DIAG:  Other low back pain  Decreased ROM of lumbar spine  Muscle weakness (generalized)  Spondylolisthesis of lumbar region  ONSET DATE: 6 months  SUBJECTIVE:                                                                                                                                                                                           SUBJECTIVE STATEMENT: Pt arrives late to session. Pt reports her pain isn't as bad during the day still and mostly at night. Reports she feels like its at 5/10 now. Reports she's  doing HEP at home but that her knees are hurting her some. Reports she will talk with MD after injection on 6/30 about next PT session scheduled on 7/2 and if she should come that soon after   Patient reports back pain started hurting randomly 6  months ago. Reports pain as being in thoracic and lumbar spine and travels down to R leg all the way to foot at times. Feels like a burning sensation down the leg, reports back pain as a nagging pain/ache. Reports most pain at night time, with sitting or laying for long periods of time. Takes tylenol  and helps some.  PERTINENT HISTORY:  Has a fibrillator, 2020 Artifical heart valve, 2015 Bilateral arthritis in knees   PAIN:  Are you having pain? No  PRECAUTIONS: None  RED FLAGS: None   WEIGHT BEARING RESTRICTIONS: No  FALLS:  Has patient fallen in last 6 months? No  LIVING ENVIRONMENT: Has following equipment at home: Single point cane and Rollator Uses SPC most  OCCUPATION: Retired  PLOF: Independent  PATIENT GOALS: Have less soreness and pain  NEXT MD VISIT: N/A  OBJECTIVE:  Note: Objective measures were completed at Evaluation unless otherwise noted.  DIAGNOSTIC FINDINGS:  IMPRESSION: 1. Grade 1 anterolisthesis of L4 on L5 and L5 on S1 secondary to facet arthropathy. 2. Moderate bilateral neuroforaminal stenosis at L4-L5 and L5-S1. 3. Mild canal stenosis at L4-L5.  PATIENT SURVEYS:  Modified Oswestry Low Back Pain Disability Questionnaire: 18 / 50 = 36.0 % 01/29/24:  Modified Oswestry Low Back Pain Disability Questionnaire: 14 / 50 = 28.0 %  COGNITION: Overall cognitive status: Within functional limits for tasks assessed     SENSATION: WFL  POSTURE: rounded shoulders and forward head  PALPATION: TTP t/o lumbar spine. Soreness and tightness t/o lumbar and thoracic paraspinals  LUMBAR ROM:   AROM eval 01/29/24  Flexion To ankles To ankles  Extension 25% avail * 40% avail   Right lateral flexion To knee jt To  knee joint *  Left lateral flexion To knee jt To knee joint   Right rotation 15% avail 25% avail *  Left rotation 15% avail 25% avail *   (Blank rows = not tested) * = painful  LOWER EXTREMITY ROM:     Active  Right eval Left eval  Hip flexion    Hip extension    Hip abduction    Hip adduction    Hip internal rotation    Hip external rotation    Knee flexion    Knee extension    Ankle dorsiflexion    Ankle plantarflexion    Ankle inversion    Ankle eversion     (Blank rows = not tested)  LOWER EXTREMITY MMT:    MMT Right eval Left eval Right  01/29/24 Left 01/29/24  Hip flexion 4+ * in knees 4+ * in knees  4 4  Hip extension 3+ * in low back  3+ * in low back  4- 4-  Hip abduction 4- 4- 4 4  Hip adduction      Hip internal rotation      Hip external rotation      Knee flexion 4- 4- 4 4  Knee extension 4- 4- 4 4  Ankle dorsiflexion 5 5    Ankle plantarflexion      Ankle inversion      Ankle eversion       (Blank rows = not tested)  * = painful  LUMBAR SPECIAL TESTS:  Straight leg raise test: Negative and Quadrant test: Negative  FUNCTIONAL TESTS:  30 seconds chair stand test  : 10 STS   12/25/23: : 218', no pain in low back, little pain in LE 01/29/24: 30 sec chair stand test: 9 STS, no UE use  test: 275 ft., w/ SPC, dec step length with LLE  GAIT: Distance walked: 100 Assistive device utilized: Single point cane Level of assistance: Modified independence Comments: Pt ambulates with SPC, antalgic-like gait, forward lean, mild hip hiking bilat., dec knee flex, dec hip ext/flex bilaterally.  TREATMENT DATE:  01/29/24: Nustep seat 8, UE/LE level 2, 5 minutes  Progress Note: Mod Oswestry Lumbar ROM LE MMT 30 sec chair stand test test   01/18/24 Nustep seat 7 UE/LE level 3 atl beach 5 minutes Standing:  Hip abduction 2X10 Hip extension 2X10 Alternating march 2X10 Sit to stands no UE 10X Supine:  LTR, 2x10 SKTC, 10x each LE Heel slides, 10x  each LE  March, 10x2  Hip add, Yellow ball, 2x10, 5  Clamshells, 2x10 with YTB Bridges, 2x10  01/18/24 LTR, 2x10 SKTC, 10x each LE TA contract, pt in supine w/ the following: Heel slides, 10x each LE  March, 10x  Hip abd, YTB, 2x10  Hip add, Yellow ball, 2x10, 5  Clamshells, 2x10 Bridges, 2x10 NuStep, seat 10, lvl 1 - 4', lvl 2 - 6'   PATIENT EDUCATION:  Education details: PT evaluation, objective findings, POC, Importance of HEP, Precautions, Clinic policies, Anatomy Person educated: Patient Education method: Explanation and Demonstration Education comprehension: verbalized understanding and returned demonstration  HOME EXERCISE PROGRAM: Access Code: [email or web address] URL: [email or web address] Date: 12/21/2023 Prepared by: Virgia Griffins Powell-Butler  Exercises - Supine Lower Trunk Rotation  - 2 x daily - 7 x weekly - 2 sets - 10 reps - Hooklying Single Knee to Chest Stretch  - 2 x daily - 7 x weekly - 2 sets - 10 reps - Supine Double Knee to Chest  - 2 x daily - 7 x weekly - 2 sets - 10 reps - Sit to Stand  - 2 x daily - 7 x weekly - 2 sets - 10 reps   Exercises - Supine Piriformis Stretch with Foot on Ground  - 2 x daily - 7 x weekly - 3 sets - 30 hold - Seated Piriformis Stretch with Trunk Bend  - 2 x daily - 7 x weekly - 3 sets - 30 hold - Seated Hamstring Stretch  - 2 x daily - 7 x weekly - 3 sets - 30 hold    Exercises - Supine March  - 2 x daily - 7 x weekly - 2 sets - 10 reps - Supine Bridge  - 2 x daily - 7 x weekly - 2 sets - 10 reps - Supine Hip Adduction Isometric with Ball  - 2 x daily - 7 x weekly - 2 sets - 10 reps - 5 hold - Hooklying Clamshell with Resistance  - 2 x daily - 7 x weekly - 2 sets - 10 reps  ASSESSMENT:  CLINICAL IMPRESSION: Progress Note performed this date. Patient demonstrates improvement with lumbar ROM and LE strength as well as activity tolerance/endurance via 2 minute walk test. With review of goals, patient reports overall  improvement of 25% better function and/or pain reduction. Discussion at end of session about upcoming injection scheduled on 6/30. Patient will speak to MD about whether to keep PT visit that week or to reschedule. However, patient encouraged to continue PT at least two more weeks following injection due to good improvements made thus far. Patient will benefit from continued skilled physical therapy in order to address deficits to decrease daily/nightly pain as well as improve overall function.      Patient is a 78 y.o. female who  was seen today for physical therapy evaluation and treatment for  lumbar spondylilisthesis  . On this date, patient demonstrates dec LE strength, dec lumbar ROM, increased pain with LE MMT testing, tenderness when palpating lumbar spinous processes, and tightness and soreness in thoracic and lumbar paraspinal musculature. Unable to reproduce radiating pain into RLE on this date as pt describes pain most at nights or with prolonged positions but familiar low back pain is reproduced with grade II-III CPAs to lumbar spine. Patient will benefit from skilled physical therapy in order to address the above deficits to improve independence, function, and QOL.    OBJECTIVE IMPAIRMENTS: Abnormal gait, decreased activity tolerance, decreased endurance, decreased mobility, difficulty walking, decreased ROM, decreased strength, improper body mechanics, postural dysfunction, and pain.   ACTIVITY LIMITATIONS: carrying, lifting, bending, sitting, standing, squatting, stairs, transfers, bed mobility, and reach over head  PARTICIPATION LIMITATIONS: cleaning, laundry, driving, community activity, and yard work  [facility] POTENTIAL: Good  CLINICAL DECISION MAKING: Stable/uncomplicated  EVALUATION COMPLEXITY: Low   GOALS: Goals reviewed with patient? Yes  SHORT TERM GOALS: Target date: 01/04/24 Patient will be independent with performance of HEP to demonstrate adequate self management of  symptoms.  Baseline:  Goal status:  MET  2.   Patient will report at least a 25% improvement with function or pain overall since beginning PT. Baseline: REPORTS 25% ON 01/29/24 Goal status: MET   LONG TERM GOALS: Target date: 02/26/24 Patient will improve Oswestry score by   10  points in order to improve self-perceived disability and overall function.  Baseline: Goal status: IN PROGRESS   2.  Patient will improve bilat hip ext MMT  to at least 4-  for improved hip ext during gait. Baseline: Goal status: MET   3.  Patient will improve  30 sec chair rise  test to at least 12 STS  in order to improve LE strength and endurance for improved gait. Baseline:  Goal status: IN PROGRESS   4.  Patient will report overall 50% improvement since beginning PT. Baseline:  Goal status: IN PROGRESS   PLAN:  PT FREQUENCY: 1-2x/week  PT DURATION: 6 weeks  PLANNED INTERVENTIONS: 97164- PT Re-evaluation, 97110-Therapeutic exercises, 97530- Therapeutic activity, 97112- Neuromuscular re-education, 97535- Self Care, 16109- Manual therapy, [phone]- Gait training, [phone]- Electrical stimulation (manual), Patient/Family education, Balance training, Stair training, Dry Needling, Joint mobilization, Spinal mobilization, DME instructions, and Moist heat.  PLAN FOR NEXT SESSION: progress lumbar and thoracic mobility.   12:16 PM, 01/29/24 Marysue Sola, PT, DPT [facility]

## 2024-02-02 ENCOUNTER — Encounter: Payer: Self-pay | Admitting: Family Medicine

## 2024-02-02 ENCOUNTER — Ambulatory Visit: Admitting: Family Medicine

## 2024-02-02 VITALS — BP 124/70 | HR 78 | Resp 16 | Ht 66.0 in | Wt 187.0 lb

## 2024-02-02 DIAGNOSIS — E1122 Type 2 diabetes mellitus with diabetic chronic kidney disease: Secondary | ICD-10-CM

## 2024-02-02 DIAGNOSIS — Z0001 Encounter for general adult medical examination with abnormal findings: Secondary | ICD-10-CM | POA: Diagnosis not present

## 2024-02-02 DIAGNOSIS — E559 Vitamin D deficiency, unspecified: Secondary | ICD-10-CM | POA: Diagnosis not present

## 2024-02-02 DIAGNOSIS — I129 Hypertensive chronic kidney disease with stage 1 through stage 4 chronic kidney disease, or unspecified chronic kidney disease: Secondary | ICD-10-CM | POA: Diagnosis not present

## 2024-02-02 DIAGNOSIS — N184 Chronic kidney disease, stage 4 (severe): Secondary | ICD-10-CM | POA: Diagnosis not present

## 2024-02-02 MED ORDER — FUROSEMIDE 40 MG PO TABS: ORAL_TABLET | ORAL | 1 refills | Status: DC

## 2024-02-02 NOTE — Assessment & Plan Note (Signed)

## 2024-02-02 NOTE — Patient Instructions (Addendum)
 F/u in 4 months, call if you need me sooner  Nurse pls send for diabetic eye exam [address] 2025 per pt  Urine ACR, tSH, vit d and hBA1c and cBC today  PLease schedule mammogram in October   Careful not to fall  Thanks for choosing [facility], we consider it a privelige to serve you.

## 2024-02-02 NOTE — Progress Notes (Signed)
    [name] [name]     MRN: [id number]      DOB: Jan 18, 1946  Chief Complaint  Patient presents with   Annual Exam    Cpe     HPI: Patient is in for annual physical exam. No other health concerns are expressed or addressed at the visit. . Immunization is reviewed , and  needs top be updated  PE: BP 124/70   Pulse 78   Resp 16   Ht 5' 6 (1.676 m)   Wt 187 lb (84.8 kg)   SpO2 98%   BMI 30.18 kg/m   Pleasant  female, alert and oriented x 3, in no cardio-pulmonary distress. Afebrile. HEENT No facial trauma or asymetry. Sinuses non tender.  Extra occullar muscles intact.. External ears normal, . Neck: supple, no adenopathy,JVD or thyromegaly.No bruits.  Chest: Clear to ascultation bilaterally.No crackles or wheezes. Non tender to palpation  Breast: Not examined, asymptomatic, has decided on having a mammogram in the Fall   Cardiovascular system; Heart sounds normal,  S1 and  S2 ,no S3.   murmur, no  thrill. Apical beat not displaced Peripheral pulses normal.  Abdomen: Soft, non tender, no organomegaly or masses. No bruits. Bowel sounds normal. No guarding, tenderness or rebound.     Musculoskeletal exam: Decrfeased  ROM of spine, hips , shoulders and knees. No deformity ,swelling or crepitus noted. No muscle wasting or atrophy.   Neurologic: Cranial nerves 2 to 12 intact. Power, tone ,sensation and reflexes normal throughout. No disturbance in gait. No tremor.  Skin: Intact, no ulceration, erythema , scaling or rash noted. Pigmentation normal throughout  Psych; Normal mood and affect. Judgement and concentration normal   Assessment & Plan:  Encounter for Medicare annual examination with abnormal findings Annual exam as documented. Counseling done  re healthy lifestyle involving commitment to 150 minutes exercise per week, heart healthy diet, and attaining healthy weight.The importance of adequate sleep also discussed. Regular seat belt use and  home safety, is also discussed. Changes in health habits are decided on by the patient with goals and time frames  set for achieving them. Immunization and cancer screening needs are specifically addressed at this visit. '

## 2024-02-02 NOTE — Telephone Encounter (Unsigned)
 Copied from CRM [id number]. Topic: Clinical - Medication Refill >> Feb 02, 2024  2:30 PM Tiffany S wrote: Medication: NOVOLOG  MIX 70/30 FLEXPEN (70-30) 100 UNIT/ML FlexPen [[id number]]  Has the patient contacted their pharmacy? Yes (Agent: If no, request that the patient contact the pharmacy for the refill. If patient does not wish to contact the pharmacy document the reason why and proceed with request.) (Agent: If yes, when and what did the pharmacy advise?)  This is the patient's preferred pharmacy:  [facility] [place], [place] - [address] [place] [address] Phone: [phone] Fax: [phone]  Is this the correct pharmacy for this prescription? Yes If no, delete pharmacy and type the correct one.   Has the prescription been filled recently? Yes  Is the patient out of the medication? Yes  Has the patient been seen for an appointment in the last year OR does the patient have an upcoming appointment? Yes  Can we respond through MyChart? Yes  Agent: Please be advised that Rx refills may take up to 3 business days. We ask that you follow-up with your pharmacy.

## 2024-02-04 LAB — CBC WITH DIFFERENTIAL/PLATELET
Basophils Absolute: 0.1 10*3/uL (ref 0.0–0.2)
Basos: 2 %
EOS (ABSOLUTE): 0.1 10*3/uL (ref 0.0–0.4)
Eos: 2 %
Hematocrit: 36.9 % (ref 34.0–46.6)
Hemoglobin: 11.8 g/dL (ref 11.1–15.9)
Immature Grans (Abs): 0 10*3/uL (ref 0.0–0.1)
Immature Granulocytes: 0 %
Lymphocytes Absolute: 1.5 10*3/uL (ref 0.7–3.1)
Lymphs: 35 %
MCH: 29.3 pg (ref 26.6–33.0)
MCHC: 32 g/dL (ref 31.5–35.7)
MCV: 92 fL (ref 79–97)
Monocytes Absolute: 0.5 10*3/uL (ref 0.1–0.9)
Monocytes: 12 %
Neutrophils Absolute: 2.1 10*3/uL (ref 1.4–7.0)
Neutrophils: 48 %
Platelets: 138 10*3/uL — ABNORMAL LOW (ref 150–450)
RBC: 4.03 x10E6/uL (ref 3.77–5.28)
RDW: 15.3 % (ref 11.7–15.4)
WBC: 4.4 10*3/uL (ref 3.4–10.8)

## 2024-02-04 LAB — MICROALBUMIN / CREATININE URINE RATIO
Creatinine, Urine: 99.4 mg/dL
Microalb/Creat Ratio: 6 mg/g{creat} (ref 0–29)
Microalbumin, Urine: 6.4 ug/mL

## 2024-02-04 LAB — HEMOGLOBIN A1C
Est. average glucose Bld gHb Est-mCnc: 143 mg/dL
Hgb A1c MFr Bld: 6.6 % — ABNORMAL HIGH (ref 4.8–5.6)

## 2024-02-04 LAB — VITAMIN D 25 HYDROXY (VIT D DEFICIENCY, FRACTURES): Vit D, 25-Hydroxy: 46.6 ng/mL (ref 30.0–100.0)

## 2024-02-04 LAB — TSH: TSH: 2.34 u[IU]/mL (ref 0.450–4.500)

## 2024-02-05 ENCOUNTER — Other Ambulatory Visit: Payer: Self-pay | Admitting: Family Medicine

## 2024-02-05 ENCOUNTER — Ambulatory Visit (HOSPITAL_COMMUNITY)

## 2024-02-05 ENCOUNTER — Encounter (HOSPITAL_COMMUNITY): Payer: Self-pay

## 2024-02-05 DIAGNOSIS — M5386 Other specified dorsopathies, lumbar region: Secondary | ICD-10-CM

## 2024-02-05 DIAGNOSIS — M4316 Spondylolisthesis, lumbar region: Secondary | ICD-10-CM

## 2024-02-05 DIAGNOSIS — M5459 Other low back pain: Secondary | ICD-10-CM

## 2024-02-05 DIAGNOSIS — M6281 Muscle weakness (generalized): Secondary | ICD-10-CM

## 2024-02-05 NOTE — Therapy (Signed)
 OUTPATIENT PHYSICAL THERAPY THORACOLUMBAR TREATMENT   Patient Name: [name] [name] MRN: [id number] DOB:Oct 16, 1945, 78 y.o., female Today's Date: 02/05/2024  END OF SESSION:  PT End of Session - 02/05/24 0942     Visit Number 6    Number of Visits 9    Date for PT Re-Evaluation 02/28/24    Authorization Type [employer] P[employer]    Authorization Time Period No auth, no limit    Progress Note Due on Visit 9    PT Start Time [id number]    PT Stop Time 1014    PT Time Calculation (min) 31 min    Activity Tolerance Patient tolerated treatment well    Behavior During Therapy [facility] for tasks assessed/performed           Past Medical History:  Diagnosis Date   Acute hypoxemic respiratory failure due to COVID-19 (HCC) 06/29/2020   Anxiety    Aortic stenosis    Arthritis    Biventricular ICD (implantable cardioverter-defibrillator) in place    March 2021, [employer]  -Dr. 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   23 mm Edwards Sapien 3 transcatheter heart valve placed via open right transfemoral approach   Symptomatic PVCs    Past Surgical History:  Procedure Laterality Date   BIV ICD INSERTION CRT-D N/A 10/28/2019   Procedure: BIV ICD INSERTION CRT-D;  Surgeon: Waddell Danelle ORN, MD;  Location: [facility] INVASIVE CV LAB;  Service: Cardiovascular;  Laterality: N/A;   CARDIAC CATHETERIZATION     CESAREAN SECTION  1987   FLEXIBLE  BRONCHOSCOPY N/A 01/14/2015   Procedure: FLEXIBLE BRONCHOSCOPY;  Surgeon: Elspeth JAYSON Millers, MD;  Location: [facility] OR;  Service: Thoracic;  Laterality: N/A;   LEFT AND RIGHT HEART CATHETERIZATION WITH CORONARY ANGIOGRAM N/A 12/05/2011   Procedure: LEFT AND RIGHT HEART CATHETERIZATION WITH CORONARY ANGIOGRAM;  Surgeon: Lonni JONETTA Cash, MD;  Location: [facility] CATH LAB;  Service: Cardiovascular;  Laterality: N/A;   LEFT AND RIGHT HEART CATHETERIZATION WITH CORONARY ANGIOGRAM N/A 05/23/2014   Procedure: LEFT AND RIGHT HEART CATHETERIZATION WITH CORONARY ANGIOGRAM;  Surgeon: Lonni JONETTA Cash, MD;  Location: [facility] CATH LAB;  Service: Cardiovascular;  Laterality: N/A;   MULTIPLE EXTRACTIONS WITH ALVEOLOPLASTY N/A 02/27/2014   Procedure: Extraction of tooth #'s 2,4,5,6,7,8,9,10,11,12, 22, 23, 24, 25, 26, 28 with alveoloplasty and bilateral mandibular tori reductions;  Surgeon: Tanda JULIANNA Fanny, DDS;  Location: [facility] OR;  Service: Oral Surgery;  Laterality: N/A;   RIGID BRONCHOSCOPY N/A 01/14/2015   Procedure: RIGID BRONCHOSCOPY with Removal Of Foreign Body ;  Surgeon: Elspeth JAYSON Millers, MD;  Location: [facility] OR;  Service: Thoracic;  Laterality: N/A;   TEE WITHOUT CARDIOVERSION N/A 03/24/2015   Procedure: TRANSESOPHAGEAL ECHOCARDIOGRAM (TEE);  Surgeon: Lonni JONETTA Cash, MD;  Location: [facility] OR;  Service: Open Heart Surgery;  Laterality: N/A;   TRANSCATHETER AORTIC VALVE REPLACEMENT, TRANSFEMORAL  Bilateral 03/24/2015   Procedure: TRANSCATHETER AORTIC VALVE REPLACEMENT, TRANSFEMORAL;  Surgeon: Lonni JONETTA Cash, MD;  Location[facility] OR;  Service: Open Heart Surgery;  Laterality: Bilateral;   TUBAL LIGATION  1987   VIDEO BRONCHOSCOPY Bilateral 06/05/2014   Procedure: VIDEO BRONCHOSCOPY WITHOUT FLUORO;  Surgeon: Vicenta KATHEE Lennert, MD;  [facility] END[facility];  Service: Cardiopulmonary;  Laterality: Bilateral;   Patient Active Problem List   Diagnosis Date Noted   Spondylolisthesis of lumbar region 11/22/2023    Viral illness 07/20/2023   Encounter for immunization 06/19/2023   Back pain with radiculopathy 06/19/2023   Encounter for examination following treatment at hospital 06/19/2023   Grief at loss of child 04/25/2023   Pain due to onychomycosis of toenails of both feet 11/23/2022   Type 2 diabetes mellitus with vascular disease (HCC) 11/23/2022   Encounter for Medicare annual examination with abnormal findings 12/07/2021   Neck pain 02/08/2021   Cervicogenic headache 02/08/2021   CKD (chronic kidney disease), stage IV (HCC) 06/29/2020   Hyperlipidemia associated with type 2 diabetes mellitus (HCC) 06/29/2020   Benign essential hypertension 05/27/2020   Chronic combined systolic and diastolic heart failure (HCC) 05/27/2020   Generalized osteoarthritis 07/26/2019   Osteoarthritis of both knees 03/16/2017   History of aortic valve replacement 03/24/2015   Hyperlipidemia LDL goal <70 05/24/2014   Severe aortic stenosis 05/23/2014   Solitary pulmonary nodule 02/10/2014   Left bundle-branch block 12/03/2013   Type 2 DM with CKD stage 4 and hypertension (HCC) 05/21/2013   Seasonal allergies 02/22/2012   Cardiomyopathy (HCC) 10/14/2011   Obesity (BMI 30.0-34.9) 10/24/2007    PCP: Antonetta Rollene BRAVO, MD  REFERRING PROVIDER: Johnanna Camie Mates, PA-C  REFERRING DIAG:  lumbar spondylilisthesis    Rationale for Evaluation and Treatment: Rehabilitation  THERAPY DIAG:  Other low back pain  Decreased ROM of lumbar spine  Muscle weakness (generalized)  Spondylolisthesis of lumbar region  ONSET DATE: 6 months  SUBJECTIVE:                                                                                                                                                                                           SUBJECTIVE STATEMENT: Pt arrives late to session. Pt reporting she has been walking a lot more and using her stationary bike. HEP going well, doing them once a day.   Patient  reports back pain started hurting randomly 6 months ago. Reports pain as being in thoracic and lumbar spine and travels down to R leg all the way to foot at times. Feels like a burning sensation down the leg, reports back pain as a nagging pain/ache. Reports  most pain at night time, with sitting or laying for long periods of time. Takes tylenol  and helps some.  PERTINENT HISTORY:  Has a fibrillator, 2020 Artifical heart valve, 2015 Bilateral arthritis in knees   PAIN:  Are you having pain? No  PRECAUTIONS: None  RED FLAGS: None   WEIGHT BEARING RESTRICTIONS: No  FALLS:  Has patient fallen in last 6 months? No  LIVING ENVIRONMENT: Has following equipment at home: Single point cane and Rollator Uses SPC most  OCCUPATION: Retired  PLOF: Independent  PATIENT GOALS: Have less soreness and pain  NEXT MD VISIT: N/A  OBJECTIVE:  Note: Objective measures were completed at Evaluation unless otherwise noted.  DIAGNOSTIC FINDINGS:  IMPRESSION: 1. Grade 1 anterolisthesis of L4 on L5 and L5 on S1 secondary to facet arthropathy. 2. Moderate bilateral neuroforaminal stenosis at L4-L5 and L5-S1. 3. Mild canal stenosis at L4-L5.  PATIENT SURVEYS:  Modified Oswestry Low Back Pain Disability Questionnaire: 18 / 50 = 36.0 % 01/29/24:  Modified Oswestry Low Back Pain Disability Questionnaire: 14 / 50 = 28.0 %  COGNITION: Overall cognitive status: Within functional limits for tasks assessed     SENSATION: WFL  POSTURE: rounded shoulders and forward head  PALPATION: TTP t/o lumbar spine. Soreness and tightness t/o lumbar and thoracic paraspinals  LUMBAR ROM:   AROM eval 01/29/24  Flexion To ankles To ankles  Extension 25% avail * 40% avail   Right lateral flexion To knee jt To knee joint *  Left lateral flexion To knee jt To knee joint   Right rotation 15% avail 25% avail *  Left rotation 15% avail 25% avail *   (Blank rows = not tested) * = painful  LOWER EXTREMITY ROM:      Active  Right eval Left eval  Hip flexion    Hip extension    Hip abduction    Hip adduction    Hip internal rotation    Hip external rotation    Knee flexion    Knee extension    Ankle dorsiflexion    Ankle plantarflexion    Ankle inversion    Ankle eversion     (Blank rows = not tested)  LOWER EXTREMITY MMT:    MMT Right eval Left eval Right  01/29/24 Left 01/29/24  Hip flexion 4+ * in knees 4+ * in knees  4 4  Hip extension 3+ * in low back  3+ * in low back  4- 4-  Hip abduction 4- 4- 4 4  Hip adduction      Hip internal rotation      Hip external rotation      Knee flexion 4- 4- 4 4  Knee extension 4- 4- 4 4  Ankle dorsiflexion 5 5    Ankle plantarflexion      Ankle inversion      Ankle eversion       (Blank rows = not tested)  * = painful  LUMBAR SPECIAL TESTS:  Straight leg raise test: Negative and Quadrant test: Negative  FUNCTIONAL TESTS:  30 seconds chair stand test  : 10 STS   12/25/23: : 218', no pain in low back, little pain in LE 01/29/24: 30 sec chair stand test: 9 STS, no UE use test: 275 ft., w/ SPC, dec step length with LLE  GAIT: Distance walked: 100 Assistive device utilized: Single point cane Level of assistance: Modified independence Comments: Pt ambulates with SPC, antalgic-like gait, forward lean, mild hip hiking bilat., dec  knee flex, dec hip ext/flex bilaterally.  TREATMENT DATE:  02/05/24: NuStep, seat 9, level 3, 5' In // bars:   STS, 10x-->10x feet on foam  Side Stepping, GTB around knees, 10 laps  Hip Extension, GTB around knees, 10x each LE, tactile cueing req Shoulder rows, YTB, 10x, scap retract and TA contract emphasized Shoulder ext, YTB, 10x, scap retract and TA contract emphasized  01/29/24: Nustep seat 8, UE/LE level 2, 5 minutes  Progress Note: Mod Oswestry Lumbar ROM LE MMT 30 sec chair stand test test   01/18/24 Nustep seat 7 UE/LE level 3 atl beach 5 minutes Standing:  Hip abduction 2X10 Hip  extension 2X10 Alternating march 2X10 Sit to stands no UE 10X Supine:  LTR, 2x10 SKTC, 10x each LE Heel slides, 10x each LE  March, 10x2  Hip add, Yellow ball, 2x10, 5  Clamshells, 2x10 with YTB Bridges, 2x10   PATIENT EDUCATION:  Education details: PT evaluation, objective findings, POC, Importance of HEP, Precautions, Clinic policies, Anatomy Person educated: Patient Education method: Explanation and Demonstration Education comprehension: verbalized understanding and returned demonstration  HOME EXERCISE PROGRAM: Access Code: [email or web address] URL: [email or web address] Date: 12/21/2023 Prepared by: Rosaria Powell-Butler  Exercises - Supine Lower Trunk Rotation  - 2 x daily - 7 x weekly - 2 sets - 10 reps - Hooklying Single Knee to Chest Stretch  - 2 x daily - 7 x weekly - 2 sets - 10 reps - Supine Double Knee to Chest  - 2 x daily - 7 x weekly - 2 sets - 10 reps - Sit to Stand  - 2 x daily - 7 x weekly - 2 sets - 10 reps   Exercises - Supine Piriformis Stretch with Foot on Ground  - 2 x daily - 7 x weekly - 3 sets - 30 hold - Seated Piriformis Stretch with Trunk Bend  - 2 x daily - 7 x weekly - 3 sets - 30 hold - Seated Hamstring Stretch  - 2 x daily - 7 x weekly - 3 sets - 30 hold    Exercises - Supine March  - 2 x daily - 7 x weekly - 2 sets - 10 reps - Supine Bridge  - 2 x daily - 7 x weekly - 2 sets - 10 reps - Supine Hip Adduction Isometric with Ball  - 2 x daily - 7 x weekly - 2 sets - 10 reps - 5 hold - Hooklying Clamshell with Resistance  - 2 x daily - 7 x weekly - 2 sets - 10 reps  ASSESSMENT:  CLINICAL IMPRESSION: Session limited due to patient late arrival. Started with general warm up on Nustep with increased resistance. Followed with increasing LE strengthening in standing position today to address endurance. Requires seated rest breaks between activities. Patient also requires tactile cueing during hip ext to limit knee flexion during.  To address  posture, added shoulder rows/ext with emphasis on scapular retraction and TA contraction. TA contraction emphasized throughout all exercises/activities. Patient will benefit from continued skilled physical therapy in order to address deficits to decrease daily/nightly pain as well as improve overall function.     EVAL: Patient is a 78 y.o. female who was seen today for physical therapy evaluation and treatment for  lumbar spondylilisthesis  . On this date, patient demonstrates dec LE strength, dec lumbar ROM, increased pain with LE MMT testing, tenderness when palpating lumbar spinous processes, and tightness and soreness in thoracic and lumbar paraspinal musculature. Unable to reproduce  radiating pain into RLE on this date as pt describes pain most at nights or with prolonged positions but familiar low back pain is reproduced with grade II-III CPAs to lumbar spine. Patient will benefit from skilled physical therapy in order to address the above deficits to improve independence, function, and QOL.    OBJECTIVE IMPAIRMENTS: Abnormal gait, decreased activity tolerance, decreased endurance, decreased mobility, difficulty walking, decreased ROM, decreased strength, improper body mechanics, postural dysfunction, and pain.   ACTIVITY LIMITATIONS: carrying, lifting, bending, sitting, standing, squatting, stairs, transfers, bed mobility, and reach over head  PARTICIPATION LIMITATIONS: cleaning, laundry, driving, community activity, and yard work  [facility] POTENTIAL: Good  CLINICAL DECISION MAKING: Stable/uncomplicated  EVALUATION COMPLEXITY: Low   GOALS: Goals reviewed with patient? Yes  SHORT TERM GOALS: Target date: 01/04/24 Patient will be independent with performance of HEP to demonstrate adequate self management of symptoms.  Baseline:  Goal status:  MET  2.   Patient will report at least a 25% improvement with function or pain overall since beginning PT. Baseline: REPORTS 25% ON  01/29/24 Goal status: MET   LONG TERM GOALS: Target date: 02/26/24 Patient will improve Oswestry score by   10  points in order to improve self-perceived disability and overall function.  Baseline: Goal status: IN PROGRESS   2.  Patient will improve bilat hip ext MMT  to at least 4-  for improved hip ext during gait. Baseline: Goal status: MET   3.  Patient will improve  30 sec chair rise  test to at least 12 STS  in order to improve LE strength and endurance for improved gait. Baseline:  Goal status: IN PROGRESS   4.  Patient will report overall 50% improvement since beginning PT. Baseline:  Goal status: IN PROGRESS   PLAN:  PT FREQUENCY: 1-2x/week  PT DURATION: 6 weeks  PLANNED INTERVENTIONS: 97164- PT Re-evaluation, 97110-Therapeutic exercises, 97530- Therapeutic activity, 97112- Neuromuscular re-education, 97535- Self Care, 02859- Manual therapy, [phone]- Gait training, [phone]- Electrical stimulation (manual), Patient/Family education, Balance training, Stair training, Dry Needling, Joint mobilization, Spinal mobilization, DME instructions, and Moist heat.  PLAN FOR NEXT SESSION: progress lumbar and thoracic mobility, postural re-educ   10:22 AM, 02/05/24 Rosaria Settler, PT, DPT [facility]

## 2024-02-05 NOTE — Telephone Encounter (Signed)
 Copied from CRM [id number]. Topic: Clinical - Medication Refill >> Feb 05, 2024  9:13 AM Carlatta H wrote: Medication: NOVOLOG  MIX 70/30 FLEXPEN (70-30) 100 UNIT/ML FlexPen [[id number]]  Has the patient contacted their pharmacy? Yes (Agent: If no, request that the patient contact the pharmacy for the refill. If patient does not wish to contact the pharmacy document the reason why and proceed with request.) (Agent: If yes, when and what did the pharmacy advise?) Contact office  This is the patient's preferred pharmacy:  [facility] - [place], [place] - [address] [place] [address] Phone: [phone] Fax: [phone]  Is this the correct pharmacy for this prescription? Yes If no, delete pharmacy and type the correct one.   Has the prescription been filled recently? No  Is the patient out of the medication? Yes  Has the patient been seen for an appointment in the last year OR does the patient have an upcoming appointment? Yes  Can we respond through MyChart? No  Agent: Please be advised that Rx refills may take up to 3 business days. We ask that you follow-up with your pharmacy.

## 2024-02-06 ENCOUNTER — Ambulatory Visit: Payer: Self-pay | Admitting: Family Medicine

## 2024-02-12 ENCOUNTER — Encounter (HOSPITAL_COMMUNITY)

## 2024-02-12 DIAGNOSIS — M5416 Radiculopathy, lumbar region: Secondary | ICD-10-CM | POA: Diagnosis not present

## 2024-02-14 ENCOUNTER — Ambulatory Visit (HOSPITAL_COMMUNITY): Attending: Surgery

## 2024-02-14 ENCOUNTER — Encounter (HOSPITAL_COMMUNITY): Payer: Self-pay

## 2024-02-14 DIAGNOSIS — M5459 Other low back pain: Secondary | ICD-10-CM | POA: Diagnosis not present

## 2024-02-14 DIAGNOSIS — M4316 Spondylolisthesis, lumbar region: Secondary | ICD-10-CM | POA: Insufficient documentation

## 2024-02-14 DIAGNOSIS — M6281 Muscle weakness (generalized): Secondary | ICD-10-CM | POA: Insufficient documentation

## 2024-02-14 DIAGNOSIS — M5386 Other specified dorsopathies, lumbar region: Secondary | ICD-10-CM | POA: Insufficient documentation

## 2024-02-14 NOTE — Therapy (Signed)
 OUTPATIENT PHYSICAL THERAPY THORACOLUMBAR TREATMENT   Patient Name: [name] [name] MRN: [id number] DOB:11-Oct-1945, 78 y.o., female Today's Date: 02/14/2024  END OF SESSION:  PT End of Session - 02/14/24 1022     Visit Number 7    Number of Visits 9    Date for PT Re-Evaluation 02/28/24    Authorization Type [employer] P[employer]    Authorization Time Period No auth, no limit    Progress Note Due on Visit 9    PT Start Time 1023    PT Stop Time 1100    PT Time Calculation (min) 37 min    Activity Tolerance Patient tolerated treatment well    Behavior During Therapy WFL for tasks assessed/performed           Past Medical History:  Diagnosis Date   Acute hypoxemic respiratory failure due to COVID-19 (HCC) 06/29/2020   Anxiety    Aortic stenosis    Arthritis    Biventricular ICD (implantable cardioverter-defibrillator) in place    March 2021, [employer]  -Dr. 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   23 mm Edwards Sapien 3 transcatheter heart valve placed via open right transfemoral approach   Symptomatic PVCs    Past Surgical History:  Procedure Laterality Date   BIV ICD INSERTION CRT-D N/A 10/28/2019   Procedure: BIV ICD INSERTION CRT-D;  Surgeon: Waddell Danelle ORN, MD;  Location: [facility] INVASIVE CV LAB;  Service: Cardiovascular;  Laterality: N/A;   CARDIAC CATHETERIZATION     CESAREAN SECTION  1987   FLEXIBLE  BRONCHOSCOPY N/A 01/14/2015   Procedure: FLEXIBLE BRONCHOSCOPY;  Surgeon: Elspeth JAYSON Millers, MD;  Location: [facility] OR;  Service: Thoracic;  Laterality: N/A;   LEFT AND RIGHT HEART CATHETERIZATION WITH CORONARY ANGIOGRAM N/A 12/05/2011   Procedure: LEFT AND RIGHT HEART CATHETERIZATION WITH CORONARY ANGIOGRAM;  Surgeon: Lonni JONETTA Cash, MD;  Location: [facility] CATH LAB;  Service: Cardiovascular;  Laterality: N/A;   LEFT AND RIGHT HEART CATHETERIZATION WITH CORONARY ANGIOGRAM N/A 05/23/2014   Procedure: LEFT AND RIGHT HEART CATHETERIZATION WITH CORONARY ANGIOGRAM;  Surgeon: Lonni JONETTA Cash, MD;  Location: [facility] CATH LAB;  Service: Cardiovascular;  Laterality: N/A;   MULTIPLE EXTRACTIONS WITH ALVEOLOPLASTY N/A 02/27/2014   Procedure: Extraction of tooth #'s 2,4,5,6,7,8,9,10,11,12, 22, 23, 24, 25, 26, 28 with alveoloplasty and bilateral mandibular tori reductions;  Surgeon: Tanda JULIANNA Fanny, DDS;  Location: [facility] OR;  Service: Oral Surgery;  Laterality: N/A;   RIGID BRONCHOSCOPY N/A 01/14/2015   Procedure: RIGID BRONCHOSCOPY with Removal Of Foreign Body ;  Surgeon: Elspeth JAYSON Millers, MD;  Location: [facility] OR;  Service: Thoracic;  Laterality: N/A;   TEE WITHOUT CARDIOVERSION N/A 03/24/2015   Procedure: TRANSESOPHAGEAL ECHOCARDIOGRAM (TEE);  Surgeon: Lonni JONETTA Cash, MD;  Location: [facility] OR;  Service: Open Heart Surgery;  Laterality: N/A;   TRANSCATHETER AORTIC VALVE REPLACEMENT, TRANSFEMORAL  Bilateral 03/24/2015   Procedure: TRANSCATHETER AORTIC VALVE REPLACEMENT, TRANSFEMORAL;  Surgeon: Lonni JONETTA Cash, MD;  Location: [facility] OR;  Service: Open Heart Surgery;  Laterality: Bilateral;   TUBAL LIGATION  1987   VIDEO BRONCHOSCOPY Bilateral 06/05/2014   Procedure: VIDEO BRONCHOSCOPY WITHOUT FLUORO;  Surgeon: Vicenta KATHEE Lennert, MD;  Location: [facility] ENDOSCOPY;  Service: Cardiopulmonary;  Laterality: Bilateral;   Patient Active Problem List   Diagnosis Date Noted   Spondylolisthesis of lumbar region 11/22/2023    Viral illness 07/20/2023   Encounter for immunization 06/19/2023   Back pain with radiculopathy 06/19/2023   Encounter for examination following treatment at hospital 06/19/2023   Grief at loss of child 04/25/2023   Pain due to onychomycosis of toenails of both feet 11/23/2022   Type 2 diabetes mellitus with vascular disease (HCC) 11/23/2022   Encounter for Medicare annual examination with abnormal findings 12/07/2021   Neck pain 02/08/2021   Cervicogenic headache 02/08/2021   CKD (chronic kidney disease), stage IV (HCC) 06/29/2020   Hyperlipidemia associated with type 2 diabetes mellitus (HCC) 06/29/2020   Benign essential hypertension 05/27/2020   Chronic combined systolic and diastolic heart failure (HCC) 05/27/2020   Generalized osteoarthritis 07/26/2019   Osteoarthritis of both knees 03/16/2017   History of aortic valve replacement 03/24/2015   Hyperlipidemia LDL goal <70 05/24/2014   Severe aortic stenosis 05/23/2014   Solitary pulmonary nodule 02/10/2014   Left bundle-branch block 12/03/2013   Type 2 DM with CKD stage 4 and hypertension (HCC) 05/21/2013   Seasonal allergies 02/22/2012   Cardiomyopathy (HCC) 10/14/2011   Obesity (BMI 30.0-34.9) 10/24/2007    PCP: Antonetta Rollene BRAVO, MD  REFERRING PROVIDER: Johnanna Camie Mates, PA-C  REFERRING DIAG:  lumbar spondylilisthesis    Rationale for Evaluation and Treatment: Rehabilitation  THERAPY DIAG:  Other low back pain  Decreased ROM of lumbar spine  Muscle weakness (generalized)  Spondylolisthesis of lumbar region  ONSET DATE: 6 months  SUBJECTIVE:                                                                                                                                                                                           SUBJECTIVE STATEMENT: Pt arrives late to session. Had injection on Monday. Wanted to cancel but MD told her to come to PT still.  Back was bothering her a little last night. But  better this morning.    Patient reports back pain started hurting randomly 6 months ago. Reports pain as being in thoracic and lumbar spine and travels down to R leg all the way to foot at times. Feels like a burning sensation down the leg,  reports back pain as a nagging pain/ache. Reports most pain at night time, with sitting or laying for long periods of time. Takes tylenol  and helps some.  PERTINENT HISTORY:  Has a fibrillator, 2020 Artifical heart valve, 2015 Bilateral arthritis in knees   PAIN:  Are you having pain? No  PRECAUTIONS: None  RED FLAGS: None   WEIGHT BEARING RESTRICTIONS: No  FALLS:  Has patient fallen in last 6 months? No  LIVING ENVIRONMENT: Has following equipment at home: Single point cane and Rollator Uses SPC most  OCCUPATION: Retired  PLOF: Independent  PATIENT GOALS: Have less soreness and pain  NEXT MD VISIT: N/A  OBJECTIVE:  Note: Objective measures were completed at Evaluation unless otherwise noted.  DIAGNOSTIC FINDINGS:  IMPRESSION: 1. Grade 1 anterolisthesis of L4 on L5 and L5 on S1 secondary to facet arthropathy. 2. Moderate bilateral neuroforaminal stenosis at L4-L5 and L5-S1. 3. Mild canal stenosis at L4-L5.  PATIENT SURVEYS:  Modified Oswestry Low Back Pain Disability Questionnaire: 18 / 50 = 36.0 % 01/29/24:  Modified Oswestry Low Back Pain Disability Questionnaire: 14 / 50 = 28.0 %  COGNITION: Overall cognitive status: Within functional limits for tasks assessed     SENSATION: WFL  POSTURE: rounded shoulders and forward head  PALPATION: TTP t/o lumbar spine. Soreness and tightness t/o lumbar and thoracic paraspinals  LUMBAR ROM:   AROM eval 01/29/24  Flexion To ankles To ankles  Extension 25% avail * 40% avail   Right lateral flexion To knee jt To knee joint *  Left lateral flexion To knee jt To knee joint   Right rotation 15% avail 25% avail *  Left rotation 15% avail 25% avail *   (Blank rows = not tested) *  = painful  LOWER EXTREMITY ROM:     Active  Right eval Left eval  Hip flexion    Hip extension    Hip abduction    Hip adduction    Hip internal rotation    Hip external rotation    Knee flexion    Knee extension    Ankle dorsiflexion    Ankle plantarflexion    Ankle inversion    Ankle eversion     (Blank rows = not tested)  LOWER EXTREMITY MMT:    MMT Right eval Left eval Right  01/29/24 Left 01/29/24  Hip flexion 4+ * in knees 4+ * in knees  4 4  Hip extension 3+ * in low back  3+ * in low back  4- 4-  Hip abduction 4- 4- 4 4  Hip adduction      Hip internal rotation      Hip external rotation      Knee flexion 4- 4- 4 4  Knee extension 4- 4- 4 4  Ankle dorsiflexion 5 5    Ankle plantarflexion      Ankle inversion      Ankle eversion       (Blank rows = not tested)  * = painful  LUMBAR SPECIAL TESTS:  Straight leg raise test: Negative and Quadrant test: Negative  FUNCTIONAL TESTS:  30 seconds chair stand test  : 10 STS   12/25/23: : 218', no pain in low back, little pain in LE 01/29/24: 30 sec chair stand test: 9 STS, no UE use test: 275 ft., w/ SPC, dec step length with LLE  GAIT: Distance walked: 100 Assistive device utilized: Single point cane Level of assistance: Modified independence Comments: Pt ambulates with SPC, antalgic-like  gait, forward lean, mild hip hiking bilat., dec knee flex, dec hip ext/flex bilaterally.  TREATMENT DATE:  02/14/24: Isometric TA contraction, pt supine, w/ green physioball, UE lift offs, 10x each UE  Heel slides, 10x each LE TA contraction + hamstring curl, feet on physioball, 2x10 Supine T's, YTB, 2x10 Seated scapular retraction, 5 holds, 2x10 Shoulder rows, YTB, 10x2, scap retract and TA contract emphasized  02/05/24: NuStep, seat 9, level 3, 5' In // bars:   STS, 10x-->10x feet on foam  Side Stepping, GTB around knees, 10 laps  Hip Extension, GTB around knees, 10x each LE, tactile cueing req Shoulder rows,  YTB, 10x, scap retract and TA contract emphasized Shoulder ext, YTB, 10x, scap retract and TA contract emphasized  01/29/24: Nustep seat 8, UE/LE level 2, 5 minutes  Progress Note: Mod Oswestry Lumbar ROM LE MMT 30 sec chair stand test test  PATIENT EDUCATION:  Education details: PT evaluation, objective findings, POC, Importance of HEP, Precautions, Clinic policies, Anatomy Person educated: Patient Education method: Explanation and Demonstration Education comprehension: verbalized understanding and returned demonstration  HOME EXERCISE PROGRAM: Access Code: [email or web address] URL: [email or web address] Date: 12/21/2023 Prepared by: Rosaria Powell-Butler  Exercises - Supine Lower Trunk Rotation  - 2 x daily - 7 x weekly - 2 sets - 10 reps - Hooklying Single Knee to Chest Stretch  - 2 x daily - 7 x weekly - 2 sets - 10 reps - Supine Double Knee to Chest  - 2 x daily - 7 x weekly - 2 sets - 10 reps - Sit to Stand  - 2 x daily - 7 x weekly - 2 sets - 10 reps   Exercises - Supine Piriformis Stretch with Foot on Ground  - 2 x daily - 7 x weekly - 3 sets - 30 hold - Seated Piriformis Stretch with Trunk Bend  - 2 x daily - 7 x weekly - 3 sets - 30 hold - Seated Hamstring Stretch  - 2 x daily - 7 x weekly - 3 sets - 30 hold   Exercises - Supine March  - 2 x daily - 7 x weekly - 2 sets - 10 reps - Supine Bridge  - 2 x daily - 7 x weekly - 2 sets - 10 reps - Supine Hip Adduction Isometric with Ball  - 2 x daily - 7 x weekly - 2 sets - 10 reps - 5 hold - Hooklying Clamshell with Resistance  - 2 x daily - 7 x weekly - 2 sets - 10 reps  Access Code: [email or web address] URL: [email or web address] Date: 02/14/2024 Prepared by: Rosaria Powell-Butler  Exercises - Seated Scapular Retraction  - 2 x daily - 7 x weekly - 3 sets - 10 reps - 5 hold - Standing Shoulder Row with Anchored Resistance  - 2 x daily - 7 x weekly - 3 sets - 10 reps  ASSESSMENT:  CLINICAL  IMPRESSION: Session limited due to patient late arrival. Began with focus on core engagement with emphasis on TA contraction with patient in supine position. Followed with postural retraining. Patient requiring verbal, tactile, and visual cueing for completing scapular retraction. Patient demo increased difficulty initially with seated scapular retraction but improvements with shoulder rows. Added both to HEP to aid in postural re-education. Patient reports decreased pain at end of session. Patient will benefit from continued skilled physical therapy in order to address deficits to decrease daily/nightly pain as well as improve overall function.    EVAL: Patient is a 78  y.o. female who was seen today for physical therapy evaluation and treatment for  lumbar spondylilisthesis  . On this date, patient demonstrates dec LE strength, dec lumbar ROM, increased pain with LE MMT testing, tenderness when palpating lumbar spinous processes, and tightness and soreness in thoracic and lumbar paraspinal musculature. Unable to reproduce radiating pain into RLE on this date as pt describes pain most at nights or with prolonged positions but familiar low back pain is reproduced with grade II-III CPAs to lumbar spine. Patient will benefit from skilled physical therapy in order to address the above deficits to improve independence, function, and QOL.    OBJECTIVE IMPAIRMENTS: Abnormal gait, decreased activity tolerance, decreased endurance, decreased mobility, difficulty walking, decreased ROM, decreased strength, improper body mechanics, postural dysfunction, and pain.   ACTIVITY LIMITATIONS: carrying, lifting, bending, sitting, standing, squatting, stairs, transfers, bed mobility, and reach over head  PARTICIPATION LIMITATIONS: cleaning, laundry, driving, community activity, and yard work  [facility] POTENTIAL: Good  CLINICAL DECISION MAKING: Stable/uncomplicated  EVALUATION COMPLEXITY: Low   GOALS: Goals reviewed  with patient? Yes  SHORT TERM GOALS: Target date: 01/04/24 Patient will be independent with performance of HEP to demonstrate adequate self management of symptoms.  Baseline:  Goal status:  MET  2.   Patient will report at least a 25% improvement with function or pain overall since beginning PT. Baseline: REPORTS 25% ON 01/29/24 Goal status: MET   LONG TERM GOALS: Target date: 02/26/24 Patient will improve Oswestry score by   10  points in order to improve self-perceived disability and overall function.  Baseline: Goal status: IN PROGRESS   2.  Patient will improve bilat hip ext MMT  to at least 4-  for improved hip ext during gait. Baseline: Goal status: MET   3.  Patient will improve  30 sec chair rise  test to at least 12 STS  in order to improve LE strength and endurance for improved gait. Baseline:  Goal status: IN PROGRESS   4.  Patient will report overall 50% improvement since beginning PT. Baseline:  Goal status: IN PROGRESS   PLAN:  PT FREQUENCY: 1-2x/week  PT DURATION: 6 weeks  PLANNED INTERVENTIONS: 97164- PT Re-evaluation, 97110-Therapeutic exercises, 97530- Therapeutic activity, 97112- Neuromuscular re-education, 97535- Self Care, [id number]- Manual therapy, [phone]- Gait training, [phone]- Electrical stimulation (manual), Patient/Family education, Balance training, Stair training, Dry Needling, Joint mobilization, Spinal mobilization, DME instructions, and Moist heat.  PLAN FOR NEXT SESSION: progress lumbar and thoracic mobility, postural re-educ   11:03 AM, 02/14/24 Rosaria Settler, PT, DPT [facility] [place]

## 2024-02-15 ENCOUNTER — Other Ambulatory Visit: Payer: Self-pay | Admitting: Family Medicine

## 2024-02-15 ENCOUNTER — Other Ambulatory Visit: Payer: Self-pay | Admitting: Cardiology

## 2024-02-15 DIAGNOSIS — I129 Hypertensive chronic kidney disease with stage 1 through stage 4 chronic kidney disease, or unspecified chronic kidney disease: Secondary | ICD-10-CM

## 2024-02-21 ENCOUNTER — Telehealth (HOSPITAL_COMMUNITY): Payer: Self-pay

## 2024-02-21 ENCOUNTER — Encounter (HOSPITAL_COMMUNITY)

## 2024-02-21 NOTE — Telephone Encounter (Signed)
 NS #1 Called patient regarding missed appointment this date. Patient reporting she was not feeling well today and could not find the number to call us  in advance. Reminded of next scheduled appointment on 02/28/24.  1:49 PM, 02/21/24 Rosaria Settler, PT, DPT [facility] [place]

## 2024-02-27 ENCOUNTER — Other Ambulatory Visit: Payer: Self-pay | Admitting: Family Medicine

## 2024-02-27 ENCOUNTER — Telehealth: Payer: Self-pay

## 2024-02-27 ENCOUNTER — Ambulatory Visit: Payer: Self-pay

## 2024-02-27 DIAGNOSIS — G8929 Other chronic pain: Secondary | ICD-10-CM

## 2024-02-27 NOTE — Telephone Encounter (Signed)
 FYI Only or Action Required?: Action required by provider: patient requesting medication to help her sleep.  Patient was last seen in primary care on 02/02/2024 by Antonetta Rollene BRAVO, MD.  Called Nurse Triage reporting Back Pain.  Symptoms began several months ago.  Interventions attempted: OTC medications: Tylenol .  Symptoms are: unchanged.  Triage Disposition: See PCP Within 2 Weeks  Patient/caregiver understands and will follow disposition?: No, wishes to speak with PCP     Copied from CRM [id number]. Topic: Clinical - Red Word Triage >> Feb 27, 2024  3:13 PM Frederich PARAS wrote: [facility] that prompted transfer to Nurse Triage: pain is 6 . she does not sleep at night, wondering can she get something to sleep, pain is in the lower back     Reason for Disposition  Back pain is a chronic symptom (recurrent or ongoing AND present > 4 weeks)  Answer Assessment - Initial Assessment Questions Patient reports increased back pain at night and would like something prescribed to help her sleep at night. Patient has declined an appointment at this time. Please advise.      1. ONSET: When did the pain begin? (e.g., minutes, hours, days)     September of last year  2. LOCATION: Where does it hurt? (upper, mid or lower back)     Lower back  3. SEVERITY: How bad is the pain?  (e.g., Scale 1-10; mild, moderate, or severe)     6/10 4. PATTERN: Is the pain constant? (e.g., yes, no; constant, intermittent)      Intermittent  5. RADIATION: Does the pain shoot into your legs or somewhere else?     No 6. CAUSE:  What do you think is causing the back pain?      History of Spondylolisthesis 7. BACK OVERUSE:  Any recent lifting of heavy objects, strenuous work or exercise?     No 8. MEDICINES: What have you taken so far for the pain? (e.g., nothing, acetaminophen , NSAIDS)     Tylenol  9. NEUROLOGIC SYMPTOMS: Do you have any weakness, numbness, or problems with bowel/bladder  control?     No 10. OTHER SYMPTOMS: Do you have any other symptoms? (e.g., fever, abdomen pain, burning with urination, blood in urine)       No  Protocols used: Back Pain-A-AH
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
## 2024-02-28 ENCOUNTER — Ambulatory Visit (HOSPITAL_COMMUNITY)

## 2024-02-28 ENCOUNTER — Encounter (HOSPITAL_COMMUNITY): Payer: Self-pay

## 2024-02-28 DIAGNOSIS — M5459 Other low back pain: Secondary | ICD-10-CM

## 2024-02-28 DIAGNOSIS — M4316 Spondylolisthesis, lumbar region: Secondary | ICD-10-CM

## 2024-02-28 DIAGNOSIS — M5386 Other specified dorsopathies, lumbar region: Secondary | ICD-10-CM

## 2024-02-28 DIAGNOSIS — M6281 Muscle weakness (generalized): Secondary | ICD-10-CM

## 2024-02-28 NOTE — Therapy (Signed)
 OUTPATIENT PHYSICAL THERAPY THORACOLUMBAR TREATMENT/PROGRESS NOTE   Patient Name: [name] [name] MRN: [id number] DOB:1945/11/08, 78 y.o., female Today's Date: 02/28/2024    Progress Note Reporting Period 01/29/24 to 02/28/24  See note below for Objective Data and Assessment of Progress/Goals.      END OF SESSION:  PT End of Session - 02/28/24 0940     Visit Number 8    Number of Visits 12    Date for PT Re-Evaluation 03/20/24    Authorization Type [employer]    Authorization Time Period No auth, no limit    Progress Note Due on Visit 12    PT Start Time 0940    PT Stop Time 1011    PT Time Calculation (min) 31 min    Activity Tolerance Patient tolerated treatment well    Behavior During Therapy WFL for tasks assessed/performed           Past Medical History:  Diagnosis Date   Acute hypoxemic respiratory failure due to COVID-19 (HCC) 06/29/2020   Anxiety    Aortic stenosis    Arthritis    Biventricular ICD (implantable cardioverter-defibrillator) in place    March 2021, [employer]  -Dr. 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   23 mm Edwards Sapien 3 transcatheter heart valve placed via open right transfemoral approach   Symptomatic PVCs    Past Surgical History:  Procedure Laterality Date   BIV ICD INSERTION CRT-D N/A 10/28/2019   Procedure: BIV ICD INSERTION CRT-D;  Surgeon: Waddell Danelle ORN, MD;  Location: [facility] INVASIVE CV LAB;  Service: Cardiovascular;  Laterality: N/A;   CARDIAC CATHETERIZATION     CESAREAN SECTION  1987   FLEXIBLE BRONCHOSCOPY N/A 01/14/2015   Procedure: FLEXIBLE BRONCHOSCOPY;  Surgeon: Elspeth JAYSON Millers, MD;  Location: [facility] OR;  Service: Thoracic;  Laterality: N/A;   LEFT AND RIGHT HEART CATHETERIZATION WITH CORONARY ANGIOGRAM N/A 12/05/2011   Procedure: LEFT AND RIGHT HEART CATHETERIZATION WITH CORONARY ANGIOGRAM;  Surgeon: Lonni JONETTA Cash, MD;  Location: [facility] CATH LAB;  Service: Cardiovascular;  Laterality: N/A;   LEFT AND RIGHT HEART CATHETERIZATION WITH CORONARY ANGIOGRAM N/A 05/23/2014   Procedure: LEFT AND RIGHT HEART CATHETERIZATION WITH CORONARY ANGIOGRAM;  Surgeon: Lonni JONETTA Cash, MD;  Location: [facility] CATH LAB;  Service: Cardiovascular;  Laterality: N/A;   MULTIPLE EXTRACTIONS WITH ALVEOLOPLASTY N/A 02/27/2014   Procedure: Extraction of tooth #'s 2,4,5,6,7,8,9,10,11,12, 22, 23, 24, 25, 26, 28 with alveoloplasty and bilateral mandibular tori reductions;  Surgeon: Tanda JULIANNA Fanny, DDS;  Location: [facility] OR;  Service: Oral Surgery;  Laterality: N/A;   RIGID BRONCHOSCOPY N/A 01/14/2015   Procedure: RIGID BRONCHOSCOPY with Removal Of Foreign Body ;  Surgeon: Elspeth JAYSON Millers, MD;  Location: [facility] OR;  Service: Thoracic;  Laterality: N/A;   TEE WITHOUT CARDIOVERSION N/A 03/24/2015   Procedure: TRANSESOPHAGEAL ECHOCARDIOGRAM (  TEE);  Surgeon: Lonni JONETTA Cash, MD;  Location: [facility] OR;  Service: Open Heart Surgery;  Laterality: N/A;   TRANSCATHETER AORTIC VALVE REPLACEMENT, TRANSFEMORAL Bilateral 03/24/2015   Procedure: TRANSCATHETER AORTIC VALVE REPLACEMENT, TRANSFEMORAL;  Surgeon: Lonni JONETTA Cash, MD;  Location: [facility] OR;  Service: Open Heart Surgery;  Laterality: Bilateral;   TUBAL LIGATION  1987   VIDEO BRONCHOSCOPY Bilateral 06/05/2014   Procedure: VIDEO BRONCHOSCOPY WITHOUT FLUORO;  Surgeon: Vicenta KATHEE Lennert, MD;  Location: [facility] ENDOSCOPY;   Service: Cardiopulmonary;  Laterality: Bilateral;   Patient Active Problem List   Diagnosis Date Noted   Spondylolisthesis of lumbar region 11/22/2023   Viral illness 07/20/2023   Encounter for immunization 06/19/2023   Back pain with radiculopathy 06/19/2023   Encounter for examination following treatment at hospital 06/19/2023   Grief at loss of child 04/25/2023   Pain due to onychomycosis of toenails of both feet 11/23/2022   Type 2 diabetes mellitus with vascular disease (HCC) 11/23/2022   Encounter for Medicare annual examination with abnormal findings 12/07/2021   Neck pain 02/08/2021   Cervicogenic headache 02/08/2021   CKD (chronic kidney disease), stage IV (HCC) 06/29/2020   Hyperlipidemia associated with type 2 diabetes mellitus (HCC) 06/29/2020   Benign essential hypertension 05/27/2020   Chronic combined systolic and diastolic heart failure (HCC) 05/27/2020   Generalized osteoarthritis 07/26/2019   Osteoarthritis of both knees 03/16/2017   History of aortic valve replacement 03/24/2015   Hyperlipidemia LDL goal <70 05/24/2014   Severe aortic stenosis 05/23/2014   Solitary pulmonary nodule 02/10/2014   Left bundle-branch block 12/03/2013   Type 2 DM with CKD stage 4 and hypertension (HCC) 05/21/2013   Seasonal allergies 02/22/2012   Cardiomyopathy (HCC) 10/14/2011   Obesity (BMI 30.0-34.9) 10/24/2007    PCP: Antonetta Rollene BRAVO, MD  REFERRING PROVIDER: Johnanna Camie Mates, PA-C  REFERRING DIAG:  lumbar spondylilisthesis    Rationale for Evaluation and Treatment: Rehabilitation  THERAPY DIAG:  Other low back pain  Decreased ROM of lumbar spine  Muscle weakness (generalized)  Spondylolisthesis of lumbar region  ONSET DATE: 6 months  SUBJECTIVE:                                                                                                                                                                                           SUBJECTIVE  STATEMENT: Pt arrives late to session. Reports she isn't in a lot of pain right now but at night its still worse. Reports she goes back to MD on Monday. Doesn't feel like the shot helped her much. Reports she is not consistent with HEP.   Patient reports back pain started  hurting randomly 6 months ago. Reports pain as being in thoracic and lumbar spine and travels down to R leg all the way to foot at times. Feels like a burning sensation down the leg, reports back pain as a nagging pain/ache. Reports most pain at night time, with sitting or laying for long periods of time. Takes tylenol  and helps some.  PERTINENT HISTORY:  Has a fibrillator, 2020 Artifical heart valve, 2015 Bilateral arthritis in knees   PAIN:  Are you having pain? No  PRECAUTIONS: None  RED FLAGS: None   WEIGHT BEARING RESTRICTIONS: No  FALLS:  Has patient fallen in last 6 months? No  LIVING ENVIRONMENT: Has following equipment at home: Single point cane and Rollator Uses SPC most  OCCUPATION: Retired  PLOF: Independent  PATIENT GOALS: Have less soreness and pain  NEXT MD VISIT: N/A  OBJECTIVE:  Note: Objective measures were completed at Evaluation unless otherwise noted.  DIAGNOSTIC FINDINGS:  IMPRESSION: 1. Grade 1 anterolisthesis of L4 on L5 and L5 on S1 secondary to facet arthropathy. 2. Moderate bilateral neuroforaminal stenosis at L4-L5 and L5-S1. 3. Mild canal stenosis at L4-L5.  PATIENT SURVEYS:  Modified Oswestry Low Back Pain Disability Questionnaire: 18 / 50 = 36.0 % 01/29/24:  Modified Oswestry Low Back Pain Disability Questionnaire: 14 / 50 = 28.0 %  02/28/24: Modified Oswestry Low Back Pain Disability Questionnaire: 13 / 50 = 26.0 %  COGNITION: Overall cognitive status: Within functional limits for tasks assessed     SENSATION: WFL  POSTURE: rounded shoulders and forward head  PALPATION: TTP t/o lumbar spine. Soreness and tightness t/o lumbar and thoracic  paraspinals  LUMBAR ROM:   AROM eval 01/29/24 02/28/24  Flexion To ankles To ankles To toes  Extension 25% avail * 40% avail  50% avail*   Right lateral flexion To knee jt To knee joint * To knee joint *  Left lateral flexion To knee jt To knee joint  To knee joint  Right rotation 15% avail 25% avail * 50 % avail*   Left rotation 15% avail 25% avail * 50% avail *   (Blank rows = not tested) * = painful  LOWER EXTREMITY ROM:     Active  Right eval Left eval  Hip flexion    Hip extension    Hip abduction    Hip adduction    Hip internal rotation    Hip external rotation    Knee flexion    Knee extension    Ankle dorsiflexion    Ankle plantarflexion    Ankle inversion    Ankle eversion     (Blank rows = not tested)  LOWER EXTREMITY MMT:    MMT Right eval Left eval Right  01/29/24 Left 01/29/24 Right 02/28/24 Left 02/28/24  Hip flexion 4+ * in knees 4+ * in knees  4 4    Hip extension 3+ * in low back  3+ * in low back  4- 4- 4- 4  Hip abduction 4- 4- 4 4 4+ 4+  Hip adduction        Hip internal rotation        Hip external rotation        Knee flexion 4- 4- 4 4    Knee extension 4- 4- 4 4 4+ 4+  Ankle dorsiflexion 5 5      Ankle plantarflexion        Ankle inversion        Ankle eversion         (  Blank rows = not tested)  * = painful  LUMBAR SPECIAL TESTS:  Straight leg raise test: Negative and Quadrant test: Negative  FUNCTIONAL TESTS:  30 seconds chair stand test  : 10 STS   12/25/23: : 218', no pain in low back, little pain in LE 01/29/24: 30 sec chair stand test: 9 STS, no UE use test: 275 ft., w/ SPC, dec step length with LLE  02/28/24: 30 sec chair stand test: 9 STS, no UE use Test: 261 ft w/ SPC  GAIT: Distance walked: 100 Assistive device utilized: Single point cane Level of assistance: Modified independence Comments: Pt ambulates with SPC, antalgic-like gait, forward lean, mild hip hiking bilat., dec knee flex, dec hip ext/flex  bilaterally.  TREATMENT DATE:  02/28/24:  Progress Note: Mod Oswestry Lumbar ROM LE MMT 30 second chair stand test 2 minute walk test Review of goals Discussion on importance of HEP compliance and extend vs discharging    02/14/24: Isometric TA contraction, pt supine, w/ green physioball, UE lift offs, 10x each UE  Heel slides, 10x each LE TA contraction + hamstring curl, feet on physioball, 2x10 Supine T's, YTB, 2x10 Seated scapular retraction, 5 holds, 2x10 Shoulder rows, YTB, 10x2, scap retract and TA contract emphasized  02/05/24: NuStep, seat 9, level 3, 5' In // bars:   STS, 10x-->10x feet on foam  Side Stepping, GTB around knees, 10 laps  Hip Extension, GTB around knees, 10x each LE, tactile cueing req Shoulder rows, YTB, 10x, scap retract and TA contract emphasized Shoulder ext, YTB, 10x, scap retract and TA contract emphasized   PATIENT EDUCATION:  Education details: PT evaluation, objective findings, POC, Importance of HEP, Precautions, Clinic policies, Anatomy Person educated: Patient Education method: Explanation and Demonstration Education comprehension: verbalized understanding and returned demonstration  HOME EXERCISE PROGRAM: Access Code: [email or web address] URL: [email or web address] Date: 12/21/2023 Prepared by: Rosaria Powell-Butler  Exercises - Supine Lower Trunk Rotation  - 2 x daily - 7 x weekly - 2 sets - 10 reps - Hooklying Single Knee to Chest Stretch  - 2 x daily - 7 x weekly - 2 sets - 10 reps - Supine Double Knee to Chest  - 2 x daily - 7 x weekly - 2 sets - 10 reps - Sit to Stand  - 2 x daily - 7 x weekly - 2 sets - 10 reps   Exercises - Supine Piriformis Stretch with Foot on Ground  - 2 x daily - 7 x weekly - 3 sets - 30 hold - Seated Piriformis Stretch with Trunk Bend  - 2 x daily - 7 x weekly - 3 sets - 30 hold - Seated Hamstring Stretch  - 2 x daily - 7 x weekly - 3 sets - 30 hold   Exercises - Supine March  - 2 x daily - 7 x  weekly - 2 sets - 10 reps - Supine Bridge  - 2 x daily - 7 x weekly - 2 sets - 10 reps - Supine Hip Adduction Isometric with Ball  - 2 x daily - 7 x weekly - 2 sets - 10 reps - 5 hold - Hooklying Clamshell with Resistance  - 2 x daily - 7 x weekly - 2 sets - 10 reps  Access Code: [email or web address] URL: [email or web address] Date: 02/14/2024 Prepared by: Rosaria Powell-Butler  Exercises - Seated Scapular Retraction  - 2 x daily - 7 x weekly - 3 sets - 10 reps - 5 hold - Standing Shoulder Row with  Anchored Resistance  - 2 x daily - 7 x weekly - 3 sets - 10 reps  ASSESSMENT:  CLINICAL IMPRESSION: Session limited due to patient late arrival. Progress note performed on this date. Patient demonstrates improvements with self-perception of function, and LE strength via LE MMT. Functional tests remains around similar levels as previously tested. Patient continues to report increased pain in low back that radiates to glutes, mostly at night. Patient admits she is not as compliant with HEP as she should be. Time spent today with discussing the importance of compliance with HEP outside of PT to see benefits. Extended for 1x/wk for 3 more weeks with encouragement of discharging at that point to HEP. Patient will benefit from continued skilled physical therapy in order to address deficits to decrease daily/nightly pain as well as improve overall function.     EVAL: Patient is a 78 y.o. female who was seen today for physical therapy evaluation and treatment for  lumbar spondylilisthesis  . On this date, patient demonstrates dec LE strength, dec lumbar ROM, increased pain with LE MMT testing, tenderness when palpating lumbar spinous processes, and tightness and soreness in thoracic and lumbar paraspinal musculature. Unable to reproduce radiating pain into RLE on this date as pt describes pain most at nights or with prolonged positions but familiar low back pain is reproduced with grade II-III CPAs to  lumbar spine. Patient will benefit from skilled physical therapy in order to address the above deficits to improve independence, function, and QOL.    OBJECTIVE IMPAIRMENTS: Abnormal gait, decreased activity tolerance, decreased endurance, decreased mobility, difficulty walking, decreased ROM, decreased strength, improper body mechanics, postural dysfunction, and pain.   ACTIVITY LIMITATIONS: carrying, lifting, bending, sitting, standing, squatting, stairs, transfers, bed mobility, and reach over head  PARTICIPATION LIMITATIONS: cleaning, laundry, driving, community activity, and yard work  [facility] POTENTIAL: Good  CLINICAL DECISION MAKING: Stable/uncomplicated  EVALUATION COMPLEXITY: Low   GOALS: Goals reviewed with patient? Yes  SHORT TERM GOALS: Target date: 01/04/24 Patient will be independent with performance of HEP to demonstrate adequate self management of symptoms.  Baseline:  Goal status:  MET  2.   Patient will report at least a 25% improvement with function or pain overall since beginning PT. Baseline: REPORTS 25% ON 01/29/24 Goal status: MET   LONG TERM GOALS: Target date: 03/20/24 Patient will improve Oswestry score by   10  points in order to improve self-perceived disability and overall function.  Baseline: Goal status: IN PROGRESS   2.  Patient will improve bilat hip ext MMT  to at least 4-  for improved hip ext during gait. Baseline: Goal status: MET   3.  Patient will improve  30 sec chair rise  test to at least 12 STS  in order to improve LE strength and endurance for improved gait. Baseline:  Goal status: IN PROGRESS   4.  Patient will report overall 50% improvement since beginning PT. Baseline: REPORTS 50% ON 02/28/24 Goal status: MET   PLAN:  PT FREQUENCY: 1-2x/week  PT DURATION: 6 weeks  PLANNED INTERVENTIONS: 97164- PT Re-evaluation, 97110-Therapeutic exercises, 97530- Therapeutic activity, 97112- Neuromuscular re-education, 97535- Self Care,  97140- Manual therapy, [phone]- Gait training, [id number]- Electrical stimulation (manual), Patient/Family education, Balance training, Stair training, Dry Needling, Joint mobilization, Spinal mobilization, DME instructions, and Moist heat.  PLAN FOR NEXT SESSION: progress lumbar and thoracic mobility, postural re-educ   10:20 AM, 02/28/24 Rosaria Settler, PT, DPT [facility] [name]

## 2024-02-29 NOTE — Telephone Encounter (Signed)
Pt informed

## 2024-02-29 NOTE — Addendum Note (Signed)
 Addended by: ANTONETTA ROLLENE BRAVO on: 02/29/2024 08:05 AM   Modules accepted: Orders

## 2024-02-29 NOTE — Telephone Encounter (Signed)
 I recommend she be referred to [facility] pain clinic where she has been referred in the past.  Please let her know this and referral has been entered.  Hopefully the shot will start to kick in and give her some relief that I do believe she will benefit from and needs pain management.

## 2024-03-04 DIAGNOSIS — M4316 Spondylolisthesis, lumbar region: Secondary | ICD-10-CM | POA: Diagnosis not present

## 2024-03-04 DIAGNOSIS — Z6830 Body mass index (BMI) 30.0-30.9, adult: Secondary | ICD-10-CM | POA: Diagnosis not present

## 2024-03-05 ENCOUNTER — Ambulatory Visit (HOSPITAL_COMMUNITY): Admitting: Physical Therapy

## 2024-03-05 ENCOUNTER — Other Ambulatory Visit: Payer: Self-pay | Admitting: Family Medicine

## 2024-03-05 DIAGNOSIS — M5459 Other low back pain: Secondary | ICD-10-CM | POA: Diagnosis not present

## 2024-03-05 DIAGNOSIS — M5386 Other specified dorsopathies, lumbar region: Secondary | ICD-10-CM

## 2024-03-05 DIAGNOSIS — M4316 Spondylolisthesis, lumbar region: Secondary | ICD-10-CM

## 2024-03-05 DIAGNOSIS — M6281 Muscle weakness (generalized): Secondary | ICD-10-CM

## 2024-03-05 NOTE — Therapy (Signed)
 OUTPATIENT PHYSICAL THERAPY THORACOLUMBAR TREATMENT   Patient Name: [name] [name] MRN: [id number] DOB:[birth year], 78 y.o., female Today's Date: 03/05/2024       END OF SESSION:  PT End of Session - 03/05/24 1123     Visit Number 9    Number of Visits 12    Date for PT Re-Evaluation 03/20/24    Authorization Type [employer] P[employer]    Authorization Time Period No auth, no limit    Progress Note Due on Visit 12    PT Start Time 1018    PT Stop Time 1100    PT Time Calculation (min) 42 min    Activity Tolerance Patient tolerated treatment well    Behavior During Therapy WFL for tasks assessed/performed            Past Medical History:  Diagnosis Date   Acute hypoxemic respiratory failure due to COVID-19 (HCC) 06/29/2020   Anxiety    Aortic stenosis    Arthritis    Biventricular ICD (implantable cardioverter-defibrillator) in place    March 2021, [employer]  -Dr. 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   23 mm Edwards Sapien 3 transcatheter heart valve placed via open right transfemoral approach   Symptomatic PVCs    Past Surgical History:  Procedure Laterality Date   BIV ICD INSERTION CRT-D N/A 10/28/2019   Procedure: BIV ICD INSERTION CRT-D;  Surgeon: Waddell Danelle ORN, MD;  Location: [facility] INVASIVE CV LAB;  Service: Cardiovascular;  Laterality: N/A;   CARDIAC CATHETERIZATION     CESAREAN SECTION   1987   FLEXIBLE BRONCHOSCOPY N/A 01/14/2015   Procedure: FLEXIBLE BRONCHOSCOPY;  Surgeon: Elspeth JAYSON Millers, MD;  Location: [facility] OR;  Service: Thoracic;  Laterality: N/A;   LEFT AND RIGHT HEART CATHETERIZATION WITH CORONARY ANGIOGRAM N/A 12/05/2011   Procedure: LEFT AND RIGHT HEART CATHETERIZATION WITH CORONARY ANGIOGRAM;  Surgeon: Lonni JONETTA Cash, MD;  Location: [facility] CATH LAB;  Service: Cardiovascular;  Laterality: N/A;   LEFT AND RIGHT HEART CATHETERIZATION WITH CORONARY ANGIOGRAM N/A 05/23/2014   Procedure: LEFT AND RIGHT HEART CATHETERIZATION WITH CORONARY ANGIOGRAM;  Surgeon: Lonni JONETTA Cash, MD;  Location: [facility] CATH LAB;  Service: Cardiovascular;  Laterality: N/A;   MULTIPLE EXTRACTIONS WITH ALVEOLOPLASTY N/A 02/27/2014   Procedure: Extraction of tooth #'s 2,4,5,6,7,8,9,10,11,12, 22, 23, 24, 25, 26, 28 with alveoloplasty and bilateral mandibular tori reductions;  Surgeon: Tanda JULIANNA Fanny, DDS;  Location: [facility] OR;  Service: Oral Surgery;  Laterality: N/A;   RIGID BRONCHOSCOPY N/A 01/14/2015   Procedure: RIGID BRONCHOSCOPY with Removal Of Foreign Body ;  Surgeon: Elspeth JAYSON Millers, MD;  Location: [facility] OR;  Service: Thoracic;  Laterality: N/A;   TEE WITHOUT CARDIOVERSION N/A 03/24/2015   Procedure: TRANSESOPHAGEAL ECHOCARDIOGRAM (TEE);  Surgeon: Lonni JONETTA Cash, MD;  Location: [facility] OR;  Service: Open Heart Surgery;  Laterality: N/A;  TRANSCATHETER AORTIC VALVE REPLACEMENT, TRANSFEMORAL Bilateral 03/24/2015   Procedure: TRANSCATHETER AORTIC VALVE REPLACEMENT, TRANSFEMORAL;  Surgeon: Lonni JONETTA Cash, MD;  Location[facility] OR;  Service: Open Heart Surgery;  Laterality: Bilateral;   TUBAL LIGATION  1987   VIDEO BRONCHOSCOPY Bilateral 06/05/2014   Procedure: VIDEO BRONCHOSCOPY WITHOUT FLUORO;  Surgeon: Vicenta KATHEE Lennert, MD;  Location: [facility] ENDOSCOPY;  Service: Cardiopulmonary;  Laterality: Bilateral;   Patient Active Problem List   Diagnosis Date Noted   Spondylolisthesis of lumbar region  11/22/2023   Viral illness 07/20/2023   Encounter for immunization 06/19/2023   Back pain with radiculopathy 06/19/2023   Encounter for examination following treatment at hospital 06/19/2023   Grief at loss of child 04/25/2023   Pain due to onychomycosis of toenails of both feet 11/23/2022   Type 2 diabetes mellitus with vascular disease (HCC) 11/23/2022   Encounter for Medicare annual examination with abnormal findings 12/07/2021   Neck pain 02/08/2021   Cervicogenic headache 02/08/2021   CKD (chronic kidney disease), stage IV (HCC) 06/29/2020   Hyperlipidemia associated with type 2 diabetes mellitus (HCC) 06/29/2020   Benign essential hypertension 05/27/2020   Chronic combined systolic and diastolic heart failure (HCC) 05/27/2020   Generalized osteoarthritis 07/26/2019   Osteoarthritis of both knees 03/16/2017   History of aortic valve replacement 03/24/2015   Hyperlipidemia LDL goal <70 05/24/2014   Severe aortic stenosis 05/23/2014   Solitary pulmonary nodule 02/10/2014   Left bundle-branch block 12/03/2013   Type 2 DM with CKD stage 4 and hypertension (HCC) 05/21/2013   Seasonal allergies 02/22/2012   Cardiomyopathy (HCC) 10/14/2011   Obesity (BMI 30.0-34.9) 10/24/2007    PCP: Antonetta Rollene BRAVO, MD  REFERRING PROVIDER: Johnanna Camie Mates, PA-C  REFERRING DIAG:  lumbar spondylilisthesis    Rationale for Evaluation and Treatment: Rehabilitation  THERAPY DIAG:  Other low back pain  Decreased ROM of lumbar spine  Muscle weakness (generalized)  Spondylolisthesis of lumbar region  ONSET DATE: 6 months  SUBJECTIVE:                                                                                                                                                                                           SUBJECTIVE STATEMENT: Pt arrives with daughter present for session today. Reports her pain is about the same as previous session, worse at night. Reports she went to  MD Monday and goes back next week for an injection. Plans on joining [facility] when discharged from PT.   Patient reports back pain started hurting randomly 6 months ago. Reports pain as being in thoracic and lumbar spine and travels down to R leg all the  way to foot at times. Feels like a burning sensation down the leg, reports back pain as a nagging pain/ache. Reports most pain at night time, with sitting or laying for long periods of time. Takes tylenol  and helps some.  PERTINENT HISTORY:  Has a fibrillator, 2020 Artifical heart valve, 2015 Bilateral arthritis in knees   PAIN:  Are you having pain? No  PRECAUTIONS: None  RED FLAGS: None   WEIGHT BEARING RESTRICTIONS: No  FALLS:  Has patient fallen in last 6 months? No  LIVING ENVIRONMENT: Has following equipment at home: Single point cane and Rollator Uses SPC most  OCCUPATION: Retired  PLOF: Independent  PATIENT GOALS: Have less soreness and pain  NEXT MD VISIT: N/A  OBJECTIVE:  Note: Objective measures were completed at Evaluation unless otherwise noted.  DIAGNOSTIC FINDINGS:  IMPRESSION: 1. Grade 1 anterolisthesis of L4 on L5 and L5 on S1 secondary to facet arthropathy. 2. Moderate bilateral neuroforaminal stenosis at L4-L5 and L5-S1. 3. Mild canal stenosis at L4-L5.  PATIENT SURVEYS:  Modified Oswestry Low Back Pain Disability Questionnaire: 18 / 50 = 36.0 % 01/29/24:  Modified Oswestry Low Back Pain Disability Questionnaire: 14 / 50 = 28.0 %  02/28/24: Modified Oswestry Low Back Pain Disability Questionnaire: 13 / 50 = 26.0 %  COGNITION: Overall cognitive status: Within functional limits for tasks assessed     SENSATION: WFL  POSTURE: rounded shoulders and forward head  PALPATION: TTP t/o lumbar spine. Soreness and tightness t/o lumbar and thoracic paraspinals  LUMBAR ROM:   AROM eval 01/29/24 02/28/24  Flexion To ankles To ankles To toes  Extension 25% avail * 40% avail  50% avail*   Right  lateral flexion To knee jt To knee joint * To knee joint *  Left lateral flexion To knee jt To knee joint  To knee joint  Right rotation 15% avail 25% avail * 50 % avail*   Left rotation 15% avail 25% avail * 50% avail *   (Blank rows = not tested) * = painful  LOWER EXTREMITY ROM:     Active  Right eval Left eval  Hip flexion    Hip extension    Hip abduction    Hip adduction    Hip internal rotation    Hip external rotation    Knee flexion    Knee extension    Ankle dorsiflexion    Ankle plantarflexion    Ankle inversion    Ankle eversion     (Blank rows = not tested)  LOWER EXTREMITY MMT:    MMT Right eval Left eval Right  01/29/24 Left 01/29/24 Right 02/28/24 Left 02/28/24  Hip flexion 4+ * in knees 4+ * in knees  4 4    Hip extension 3+ * in low back  3+ * in low back  4- 4- 4- 4  Hip abduction 4- 4- 4 4 4+ 4+  Hip adduction        Hip internal rotation        Hip external rotation        Knee flexion 4- 4- 4 4    Knee extension 4- 4- 4 4 4+ 4+  Ankle dorsiflexion 5 5      Ankle plantarflexion        Ankle inversion        Ankle eversion         (Blank rows = not tested)  * = painful  LUMBAR SPECIAL TESTS:  Straight leg raise test:  Negative and Quadrant test: Negative  FUNCTIONAL TESTS:  30 seconds chair stand test  : 10 STS   12/25/23: : 218', no pain in low back, little pain in LE 01/29/24: 30 sec chair stand test: 9 STS, no UE use test: 275 ft., w/ SPC, dec step length with LLE  02/28/24: 30 sec chair stand test: 9 STS, no UE use Test: 261 ft w/ SPC  GAIT: Distance walked: 100 Assistive device utilized: Single point cane Level of assistance: Modified independence Comments: Pt ambulates with SPC, antalgic-like gait, forward lean, mild hip hiking bilat., dec knee flex, dec hip ext/flex bilaterally.  TREATMENT DATE:  03/05/24 Standing:  shoulder rows RTB 2X10  Shoulder extensions RTB  Pallof RTB 10X each direction  Side stepping GTB 10  ft line 2RT with SPC  Hip abduction GTB 10X each  Hip extension GTB 10X each  Forward lunges onto 4 no UE assist 10X Sit to stands feet on foam no UE 10X NuStep, seat 9, level 3, 5'   02/28/24:  Progress Note: Mod Oswestry Lumbar ROM LE MMT 30 second chair stand test 2 minute walk test Review of goals Discussion on importance of HEP compliance and extend vs discharging    02/14/24: Isometric TA contraction, pt supine, w/ green physioball, UE lift offs, 10x each UE  Heel slides, 10x each LE TA contraction + hamstring curl, feet on physioball, 2x10 Supine T's, YTB, 2x10 Seated scapular retraction, 5 holds, 2x10 Shoulder rows, YTB, 10x2, scap retract and TA contract emphasized  02/05/24: NuStep, seat 9, level 3, 5' In // bars:   STS, 10x-->10x feet on foam  Side Stepping, GTB around knees, 10 laps  Hip Extension, GTB around knees, 10x each LE, tactile cueing req Shoulder rows, YTB, 10x, scap retract and TA contract emphasized Shoulder ext, YTB, 10x, scap retract and TA contract emphasized   PATIENT EDUCATION:  Education details: PT evaluation, objective findings, POC, Importance of HEP, Precautions, Clinic policies, Anatomy Person educated: Patient Education method: Explanation and Demonstration Education comprehension: verbalized understanding and returned demonstration  HOME EXERCISE PROGRAM: Access Code: [email or web address] URL: [email or web address] Date: 12/21/2023 Prepared by: Rosaria Powell-Butler  Exercises - Supine Lower Trunk Rotation  - 2 x daily - 7 x weekly - 2 sets - 10 reps - Hooklying Single Knee to Chest Stretch  - 2 x daily - 7 x weekly - 2 sets - 10 reps - Supine Double Knee to Chest  - 2 x daily - 7 x weekly - 2 sets - 10 reps - Sit to Stand  - 2 x daily - 7 x weekly - 2 sets - 10 reps   Exercises - Supine Piriformis Stretch with Foot on Ground  - 2 x daily - 7 x weekly - 3 sets - 30 hold - Seated Piriformis Stretch with Trunk Bend  - 2 x  daily - 7 x weekly - 3 sets - 30 hold - Seated Hamstring Stretch  - 2 x daily - 7 x weekly - 3 sets - 30 hold   Exercises - Supine March  - 2 x daily - 7 x weekly - 2 sets - 10 reps - Supine Bridge  - 2 x daily - 7 x weekly - 2 sets - 10 reps - Supine Hip Adduction Isometric with Ball  - 2 x daily - 7 x weekly - 2 sets - 10 reps - 5 hold - Hooklying Clamshell with Resistance  - 2 x daily - 7 x weekly -  2 sets - 10 reps  Access Code: [email or web address] URL: [email or web address] Date: 02/14/2024 Prepared by: Rosaria Powell-Butler  Exercises - Seated Scapular Retraction  - 2 x daily - 7 x weekly - 3 sets - 10 reps - 5 hold - Standing Shoulder Row with Anchored Resistance  - 2 x daily - 7 x weekly - 3 sets - 10 reps  ASSESSMENT:  CLINICAL IMPRESSION: Began session with postural theraband strengthening.  Pallof added with difficulty stabilizing core and tendency to use levator mm while pushing out and rounding forward.  Cues needed for posturing, stabilization and control with all these activities.  Side stepping completed outside of parallel bars with SPC today.  Added focused hip strengthening as well using bands with UE assist.  Sit to stands were challenging with use of foam under feet requiring cues for eccentric lowering to seat.  Finished up on nustep as pt voiced she enjoys using it.  Pt required frequent rest breaks during session due to fatigue. Patient will benefit from continued skilled physical therapy in order to address remaining functional deficits.     EVAL: Patient is a 78 y.o. female who was seen today for physical therapy evaluation and treatment for  lumbar spondylilisthesis  . On this date, patient demonstrates dec LE strength, dec lumbar ROM, increased pain with LE MMT testing, tenderness when palpating lumbar spinous processes, and tightness and soreness in thoracic and lumbar paraspinal musculature. Unable to reproduce radiating pain into RLE on this date as pt  describes pain most at nights or with prolonged positions but familiar low back pain is reproduced with grade II-III CPAs to lumbar spine. Patient will benefit from skilled physical therapy in order to address the above deficits to improve independence, function, and QOL.    OBJECTIVE IMPAIRMENTS: Abnormal gait, decreased activity tolerance, decreased endurance, decreased mobility, difficulty walking, decreased ROM, decreased strength, improper body mechanics, postural dysfunction, and pain.   ACTIVITY LIMITATIONS: carrying, lifting, bending, sitting, standing, squatting, stairs, transfers, bed mobility, and reach over head  PARTICIPATION LIMITATIONS: cleaning, laundry, driving, community activity, and yard work  [facility] POTENTIAL: Good  CLINICAL DECISION MAKING: Stable/uncomplicated  EVALUATION COMPLEXITY: Low   GOALS: Goals reviewed with patient? Yes  SHORT TERM GOALS: Target date: 01/04/24 Patient will be independent with performance of HEP to demonstrate adequate self management of symptoms.  Baseline:  Goal status:  MET  2.   Patient will report at least a 25% improvement with function or pain overall since beginning PT. Baseline: REPORTS 25% ON 01/29/24 Goal status: MET   LONG TERM GOALS: Target date: 03/20/24 Patient will improve Oswestry score by   10  points in order to improve self-perceived disability and overall function.  Baseline: Goal status: IN PROGRESS   2.  Patient will improve bilat hip ext MMT  to at least 4-  for improved hip ext during gait. Baseline: Goal status: MET   3.  Patient will improve  30 sec chair rise  test to at least 12 STS  in order to improve LE strength and endurance for improved gait. Baseline:  Goal status: IN PROGRESS   4.  Patient will report overall 50% improvement since beginning PT. Baseline: REPORTS 50% ON 02/28/24 Goal status: MET   PLAN:  PT FREQUENCY: 1-2x/week  PT DURATION: 6 weeks  PLANNED INTERVENTIONS: 97164- PT  Re-evaluation, 97110-Therapeutic exercises, 97530- Therapeutic activity, 97112- Neuromuscular re-education, 97535- Self Care, 02859- Manual therapy, [phone]- Gait training, [id number]- Electrical stimulation (manual), Patient/Family education, Balance training, Stair training,  Dry Needling, Joint mobilization, Spinal mobilization, DME instructions, and Moist heat.  PLAN FOR NEXT SESSION: progress lumbar and thoracic mobility, postural re-educ.  Complete 2 more visits with preparation for discharge.    11:24 AM, 03/05/24 Greig KATHEE Fuse, PTA/CLT [facility] Ph: [phone]

## 2024-03-06 ENCOUNTER — Other Ambulatory Visit: Payer: Self-pay | Admitting: Family Medicine

## 2024-03-06 NOTE — Addendum Note (Signed)
 Addended by: TAWNI DRILLING D on: 03/06/2024 09:57 AM   Modules accepted: Orders

## 2024-03-06 NOTE — Progress Notes (Signed)
 Remote ICD transmission.

## 2024-03-11 ENCOUNTER — Other Ambulatory Visit: Payer: Self-pay | Admitting: Family Medicine

## 2024-03-12 ENCOUNTER — Encounter (HOSPITAL_COMMUNITY): Admitting: Physical Therapy

## 2024-03-18 ENCOUNTER — Other Ambulatory Visit: Payer: Self-pay | Admitting: Family Medicine

## 2024-03-18 ENCOUNTER — Encounter (HOSPITAL_COMMUNITY)

## 2024-03-21 ENCOUNTER — Other Ambulatory Visit: Payer: Self-pay | Admitting: Family Medicine

## 2024-03-21 NOTE — Telephone Encounter (Unsigned)
 Copied from CRM [id number]. Topic: Clinical - Medication Refill >> Mar 21, 2024 10:52 AM Emylou G wrote: Medication: alendronate  (FOSAMAX ) 70 MG tablet allopurinol (ZYLOPRIM) 100 MG tablet   Has the patient contacted their pharmacy? Yes (Agent: If no, request that the patient contact the pharmacy for the refill. If patient does not wish to contact the pharmacy document the reason why and proceed with request.) (Agent: If yes, when and what did the pharmacy advise?) said to call us   This is the patient's preferred pharmacy:  [facility] - [place], [place] - [address] [place] [address] Phone: [phone] Fax: [phone]  Is this the correct pharmacy for this prescription? Yes If no, delete pharmacy and type the correct one.   Has the prescription been filled recently? No  Is the patient out of the medication? Yes  Has the patient been seen for an appointment in the last year OR does the patient have an upcoming appointment? Yes  Can we respond through MyChart? No  Agent: Please be advised that Rx refills may take up to 3 business days. We ask that you follow-up with your pharmacy.

## 2024-03-22 ENCOUNTER — Ambulatory Visit (HOSPITAL_COMMUNITY): Attending: Surgery

## 2024-03-22 ENCOUNTER — Encounter (HOSPITAL_COMMUNITY): Payer: Self-pay

## 2024-03-22 DIAGNOSIS — M4316 Spondylolisthesis, lumbar region: Secondary | ICD-10-CM | POA: Diagnosis not present

## 2024-03-22 DIAGNOSIS — M5386 Other specified dorsopathies, lumbar region: Secondary | ICD-10-CM | POA: Insufficient documentation

## 2024-03-22 DIAGNOSIS — M6281 Muscle weakness (generalized): Secondary | ICD-10-CM | POA: Insufficient documentation

## 2024-03-22 DIAGNOSIS — M5459 Other low back pain: Secondary | ICD-10-CM | POA: Insufficient documentation

## 2024-03-22 NOTE — Therapy (Signed)
 OUTPATIENT PHYSICAL THERAPY THORACOLUMBAR TREATMENT/DISCHARGE PHYSICAL THERAPY DISCHARGE SUMMARY  Visits from Start of Care: 9  Current functional level related to goals / functional outcomes: WFL, not met one goal   Remaining deficits: Knee pain, balance   Education / Equipment: Pt educated on importance of HEP compliance and consistent walking for management of low back pain and process of returning to therapy if functional level declines.   Patient agrees to discharge. Patient goals were partially met. Patient is being discharged due to being pleased with the current functional level.   Patient Name: SANAIYA WELLIVER MRN: [id number] DOB:18-May-1946, 78 y.o., female Today's Date: 03/22/2024       END OF SESSION:  PT End of Session - 03/22/24 1057     Visit Number 10    Number of Visits 12    Date for PT Re-Evaluation 03/20/24    Authorization Type [employer] P[employer]    Authorization Time Period No auth, no limit    Progress Note Due on Visit 12    PT Start Time 1058    PT Stop Time 1138    PT Time Calculation (min) 40 min    Activity Tolerance Patient tolerated treatment well    Behavior During Therapy WFL for tasks assessed/performed             Past Medical History:  Diagnosis Date   Acute hypoxemic respiratory failure due to COVID-19 (HCC) 06/29/2020   Anxiety    Aortic stenosis    Arthritis    Biventricular ICD (implantable cardioverter-defibrillator) in place    March 2021, [employer]  -Dr. 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   23 mm Edwards Sapien 3 transcatheter heart valve placed via open right transfemoral approach   Symptomatic PVCs    Past Surgical History:  Procedure Laterality Date   BIV ICD INSERTION CRT-D N/A 10/28/2019   Procedure: BIV ICD INSERTION CRT-D;  Surgeon: Waddell Danelle ORN, MD;  Location: [facility] INVASIVE CV LAB;  Service: Cardiovascular;  Laterality: N/A;   CARDIAC CATHETERIZATION     CESAREAN SECTION  1987   FLEXIBLE BRONCHOSCOPY N/A 01/14/2015   Procedure: FLEXIBLE BRONCHOSCOPY;  Surgeon: Elspeth JAYSON Millers, MD;  Location: [facility] OR;  Service: Thoracic;  Laterality: N/A;   LEFT AND RIGHT HEART CATHETERIZATION WITH CORONARY ANGIOGRAM N/A 12/05/2011   Procedure: LEFT AND RIGHT HEART CATHETERIZATION WITH CORONARY ANGIOGRAM;  Surgeon: Lonni JONETTA Cash, MD;  Location: [facility] CATH LAB;  Service: Cardiovascular;  Laterality: N/A;   LEFT AND RIGHT HEART CATHETERIZATION WITH CORONARY ANGIOGRAM N/A 05/23/2014   Procedure: LEFT AND RIGHT HEART CATHETERIZATION WITH CORONARY ANGIOGRAM;  Surgeon: Lonni JONETTA Cash, MD;  Location: [facility] CATH LAB;  Service: Cardiovascular;  Laterality: N/A;   MULTIPLE EXTRACTIONS WITH ALVEOLOPLASTY N/A 02/27/2014   Procedure: Extraction of tooth #'s 2,4,5,6,7,8,9,10,11,12, 22, 23, 24, 25, 26, 28  with alveoloplasty and bilateral mandibular tori reductions;  Surgeon: Tanda JULIANNA Fanny, DDS;  Location: [facility] OR;  Service: Oral Surgery;  Laterality: N/A;   RIGID BRONCHOSCOPY N/A 01/14/2015   Procedure: RIGID BRONCHOSCOPY with Removal Of Foreign Body ;  Surgeon: Elspeth JAYSON Millers, MD;  Location: [facility] OR;  Service: Thoracic;  Laterality: N/A;   TEE WITHOUT CARDIOVERSION N/A 03/24/2015   Procedure: TRANSESOPHAGEAL ECHOCARDIOGRAM (TEE);  Surgeon: Lonni JONETTA Cash, MD;  Location: [facility] OR;  Service: Open Heart Surgery;  Laterality: N/A;    TRANSCATHETER AORTIC VALVE REPLACEMENT, TRANSFEMORAL Bilateral 03/24/2015   Procedure: TRANSCATHETER AORTIC VALVE REPLACEMENT, TRANSFEMORAL;  Surgeon: Lonni JONETTA Cash, MD;  Location: [facility] OR;  Service: Open Heart Surgery;  Laterality: Bilateral;   TUBAL LIGATION  1987   VIDEO BRONCHOSCOPY Bilateral 06/05/2014   Procedure: VIDEO BRONCHOSCOPY WITHOUT FLUORO;  Surgeon: Vicenta KATHEE Lennert, MD;  Location: [facility] ENDOSCOPY;  Service: Cardiopulmonary;  Laterality: Bilateral;   Patient Active Problem List   Diagnosis Date Noted   Spondylolisthesis of lumbar region 11/22/2023   Viral illness 07/20/2023   Encounter for immunization 06/19/2023   Back pain with radiculopathy 06/19/2023   Encounter for examination following treatment at hospital 06/19/2023   Grief at loss of child 04/25/2023   Pain due to onychomycosis of toenails of both feet 11/23/2022   Type 2 diabetes mellitus with vascular disease (HCC) 11/23/2022   Encounter for Medicare annual examination with abnormal findings 12/07/2021   Neck pain 02/08/2021   Cervicogenic headache 02/08/2021   CKD (chronic kidney disease), stage IV (HCC) 06/29/2020   Hyperlipidemia associated with type 2 diabetes mellitus (HCC) 06/29/2020   Benign essential hypertension 05/27/2020   Chronic combined systolic and diastolic heart failure (HCC) 05/27/2020   Generalized osteoarthritis 07/26/2019   Osteoarthritis of both knees 03/16/2017   History of aortic valve replacement 03/24/2015   Hyperlipidemia LDL goal <70 05/24/2014   Severe aortic stenosis 05/23/2014   Solitary pulmonary nodule 02/10/2014   Left bundle-branch block 12/03/2013   Type 2 DM with CKD stage 4 and hypertension (HCC) 05/21/2013   Seasonal allergies 02/22/2012   Cardiomyopathy (HCC) 10/14/2011   Obesity (BMI 30.0-34.9) 10/24/2007    PCP: Antonetta Rollene BRAVO, MD  REFERRING PROVIDER: Johnanna Camie Mates, PA-C  REFERRING DIAG:  lumbar spondylilisthesis    Rationale for  Evaluation and Treatment: Rehabilitation  THERAPY DIAG:  Other low back pain  Decreased ROM of lumbar spine  Muscle weakness (generalized)  Spondylolisthesis of lumbar region  ONSET DATE: 6 months  SUBJECTIVE:  SUBJECTIVE STATEMENT: Pt arrives with granddaughter present for session today. Pt states her back was hurting a little bit this morning but has went away, 0/10. Pt states she feels she has gotten about 80% better. Pt states she has been riding a older style stationary bike at home and states the HEP has been helping with the low back pain.   Patient reports back pain started hurting randomly 6 months ago. Reports pain as being in thoracic and lumbar spine and travels down to R leg all the way to foot at times. Feels like a burning sensation down the leg, reports back pain as a nagging pain/ache. Reports most pain at night time, with sitting or laying for long periods of time. Takes tylenol  and helps some.  PERTINENT HISTORY:  Has a fibrillator, 2020 Artifical heart valve, 2015 Bilateral arthritis in knees   PAIN:  Are you having pain? No  PRECAUTIONS: None  RED FLAGS: None   WEIGHT BEARING RESTRICTIONS: No  FALLS:  Has patient fallen in last 6 months? No  LIVING ENVIRONMENT: Has following equipment at home: Single point cane and Rollator Uses SPC most  OCCUPATION: Retired  PLOF: Independent  PATIENT GOALS: Have less soreness and pain  NEXT MD VISIT: N/A  OBJECTIVE:  Note: Objective measures were completed at Evaluation unless otherwise noted.  DIAGNOSTIC FINDINGS:  IMPRESSION: 1. Grade 1 anterolisthesis of L4 on L5 and L5 on S1 secondary to facet arthropathy. 2. Moderate bilateral neuroforaminal stenosis at L4-L5 and L5-S1. 3. Mild canal stenosis at L4-L5.  PATIENT  SURVEYS:  Modified Oswestry Low Back Pain Disability Questionnaire: 18 / 50 = 36.0 % 01/29/24:  Modified Oswestry Low Back Pain Disability Questionnaire: 14 / 50 = 28.0 %  02/28/24: Modified Oswestry Low Back Pain Disability Questionnaire: 13 / 50 = 26.0 %   03/22/24: Modified Oswestry 8/50 COGNITION: Overall cognitive status: Within functional limits for tasks assessed     SENSATION: WFL  POSTURE: rounded shoulders and forward head  PALPATION: TTP t/o lumbar spine. Soreness and tightness t/o lumbar and thoracic paraspinals  LUMBAR ROM:   AROM eval 01/29/24 02/28/24  Flexion To ankles To ankles To toes  Extension 25% avail * 40% avail  50% avail*   Right lateral flexion To knee jt To knee joint * To knee joint *  Left lateral flexion To knee jt To knee joint  To knee joint  Right rotation 15% avail 25% avail * 50 % avail*   Left rotation 15% avail 25% avail * 50% avail *   (Blank rows = not tested) * = painful  LOWER EXTREMITY ROM:     Active  Right eval Left eval  Hip flexion    Hip extension    Hip abduction    Hip adduction    Hip internal rotation    Hip external rotation    Knee flexion    Knee extension    Ankle dorsiflexion    Ankle plantarflexion    Ankle inversion    Ankle eversion     (Blank rows = not tested)  LOWER EXTREMITY MMT:    MMT Right eval Left eval Right  01/29/24 Left 01/29/24 Right 02/28/24 Left 02/28/24  Hip flexion 4+ * in knees 4+ * in knees  4 4    Hip extension 3+ * in low back  3+ * in low back  4- 4- 4- 4  Hip abduction 4- 4- 4 4 4+ 4+  Hip adduction  Hip internal rotation        Hip external rotation        Knee flexion 4- 4- 4 4    Knee extension 4- 4- 4 4 4+ 4+  Ankle dorsiflexion 5 5      Ankle plantarflexion        Ankle inversion        Ankle eversion         (Blank rows = not tested)  * = painful  LUMBAR SPECIAL TESTS:  Straight leg raise test: Negative and Quadrant test: Negative  FUNCTIONAL TESTS:  30  seconds chair stand test  : 10 STS   12/25/23: : 218', no pain in low back, little pain in LE 01/29/24: 30 sec chair stand test: 9 STS, no UE use test: 275 ft., w/ SPC, dec step length with LLE  02/28/24: 30 sec chair stand test: 9 STS, no UE use Test: 261 ft w/ [facility]  03/22/2024 30 sec chair stand test: 10 STS, no UE use  GAIT: Distance walked: 100 Assistive device utilized: Single point cane Level of assistance: Modified independence Comments: Pt ambulates with SPC, antalgic-like gait, forward lean, mild hip hiking bilat., dec knee flex, dec hip ext/flex bilaterally.  TREATMENT DATE:  03/22/2024  Therapeutic Exercise: -Nustep seat 9, level 3 resistance,  5 minutes -Kettle bell swings, 5 pound, 2 sets of 10 reps, pt cued for neutral spine and increased ROM of knees -Standing 3 way hip 1 sets 10 reps, bilaterally, pt cued for upright trunk and maintaining of neutral spine -Lateral stepping 2 laps 20 feet per lap, pt cued for upright posture, no AD -Forward lunges on bosu ball, 1 set of 5 reps better performance going into RLE, pt cued for core activation and upright posture -Ankle weight walks, 3lbs, walking marches and butt kicks on 10 foot line, 2 laps each   Therapeutic Activity: -Sit to stands, 2 sets of 10 reps, pt cued for core activation   03/05/24 Standing:  shoulder rows RTB 2X10  Shoulder extensions RTB  Pallof RTB 10X each direction  Side stepping GTB 10 ft line 2RT with SPC  Hip abduction GTB 10X each  Hip extension GTB 10X each  Forward lunges onto 4 no UE assist 10X Sit to stands feet on foam no UE 10X NuStep, seat 9, level 3, 5'   02/28/24:  Progress Note: Mod Oswestry Lumbar ROM LE MMT 30 second chair stand test 2 minute walk test Review of goals Discussion on importance of HEP compliance and extend vs discharging    PATIENT EDUCATION:  Education details: PT evaluation, objective findings, POC, Importance of HEP, Precautions, Clinic policies,  Anatomy Person educated: Patient Education method: Explanation and Demonstration Education comprehension: verbalized understanding and returned demonstration  HOME EXERCISE PROGRAM: Access Code: [email or web address] URL: [email or web address] Date: 12/21/2023 Prepared by: Rosaria Powell-Butler  Exercises - Supine Lower Trunk Rotation  - 2 x daily - 7 x weekly - 2 sets - 10 reps - Hooklying Single Knee to Chest Stretch  - 2 x daily - 7 x weekly - 2 sets - 10 reps - Supine Double Knee to Chest  - 2 x daily - 7 x weekly - 2 sets - 10 reps - Sit to Stand  - 2 x daily - 7 x weekly - 2 sets - 10 reps   Exercises - Supine Piriformis Stretch with Foot on Ground  - 2 x daily - 7 x weekly - 3 sets - 30  hold - Seated Piriformis Stretch with Trunk Bend  - 2 x daily - 7 x weekly - 3 sets - 30 hold - Seated Hamstring Stretch  - 2 x daily - 7 x weekly - 3 sets - 30 hold   Exercises - Supine March  - 2 x daily - 7 x weekly - 2 sets - 10 reps - Supine Bridge  - 2 x daily - 7 x weekly - 2 sets - 10 reps - Supine Hip Adduction Isometric with Ball  - 2 x daily - 7 x weekly - 2 sets - 10 reps - 5 hold - Hooklying Clamshell with Resistance  - 2 x daily - 7 x weekly - 2 sets - 10 reps  Access Code: [email or web address] URL: [email or web address] Date: 02/14/2024 Prepared by: Rosaria Powell-Butler  Exercises - Seated Scapular Retraction  - 2 x daily - 7 x weekly - 3 sets - 10 reps - 5 hold - Standing Shoulder Row with Anchored Resistance  - 2 x daily - 7 x weekly - 3 sets - 10 reps  ASSESSMENT:  CLINICAL IMPRESSION: Patient continues to demonstrate improved LE strength, decreased low back pain, improved gait quality and balance. Patient also demonstrates ability to walk with no AD today with lateral stepping and ankle weight walks. Patient able to progress dynamic balance and core activation exercises today with good performance with verbal cueing. Pt demonstrates meeting all but one goal which was  STS goal but seems to be limited due to knee pain and reports no increased pain in low back upon completion. Patient to be discharged at this time due to low back pain remaining at diminished level, independence with HEP, and pt being satisfied with current level of function.     EVAL: Patient is a 78 y.o. female who was seen today for physical therapy evaluation and treatment for  lumbar spondylilisthesis  . On this date, patient demonstrates dec LE strength, dec lumbar ROM, increased pain with LE MMT testing, tenderness when palpating lumbar spinous processes, and tightness and soreness in thoracic and lumbar paraspinal musculature. Unable to reproduce radiating pain into RLE on this date as pt describes pain most at nights or with prolonged positions but familiar low back pain is reproduced with grade II-III CPAs to lumbar spine. Patient will benefit from skilled physical therapy in order to address the above deficits to improve independence, function, and QOL.    OBJECTIVE IMPAIRMENTS: Abnormal gait, decreased activity tolerance, decreased endurance, decreased mobility, difficulty walking, decreased ROM, decreased strength, improper body mechanics, postural dysfunction, and pain.   ACTIVITY LIMITATIONS: carrying, lifting, bending, sitting, standing, squatting, stairs, transfers, bed mobility, and reach over head  PARTICIPATION LIMITATIONS: cleaning, laundry, driving, community activity, and yard work  [facility] POTENTIAL: Good  CLINICAL DECISION MAKING: Stable/uncomplicated  EVALUATION COMPLEXITY: Low   GOALS: Goals reviewed with patient? Yes  SHORT TERM GOALS: Target date: 01/04/24 Patient will be independent with performance of HEP to demonstrate adequate self management of symptoms.  Baseline:  Goal status:  MET  2.   Patient will report at least a 25% improvement with function or pain overall since beginning PT. Baseline: REPORTS 25% ON 01/29/24 Goal status: MET   LONG TERM  GOALS: Target date: 03/20/24 Patient will improve Oswestry score by   10  points in order to improve self-perceived disability and overall function.  Baseline: Goal status:  MET   2.  Patient will improve bilat hip ext MMT  to at least 4-  for  improved hip ext during gait. Baseline: Goal status: MET   3.  Patient will improve  30 sec chair rise  test to at least 12 STS  in order to improve LE strength and endurance for improved gait. Baseline:  Goal status: NOT MET   4.  Patient will report overall 50% improvement since beginning PT. Baseline: REPORTS 50% ON 02/28/24 Goal status: MET   PLAN:  PT FREQUENCY: 1-2x/week  PT DURATION: 6 weeks  PLANNED INTERVENTIONS: 97164- PT Re-evaluation, 97110-Therapeutic exercises, 97530- Therapeutic activity, 97112- Neuromuscular re-education, 97535- Self Care, 02859- Manual therapy, [phone]- Gait training, [phone]- Electrical stimulation (manual), Patient/Family education, Balance training, Stair training, Dry Needling, Joint mobilization, Spinal mobilization, DME instructions, and Moist heat.  PLAN FOR NEXT SESSION: Discharged   Lang Ada, PT, DPT [facility] Office: [phone] 11:50 AM, 03/22/24

## 2024-03-25 ENCOUNTER — Telehealth: Payer: Self-pay

## 2024-03-25 ENCOUNTER — Other Ambulatory Visit: Payer: Self-pay | Admitting: Family Medicine

## 2024-03-25 NOTE — Telephone Encounter (Signed)
 Copied from CRM [id number]. Topic: Clinical - Medication Refill >> Mar 21, 2024 10:52 AM Emylou G wrote: Medication: alendronate  (FOSAMAX ) 70 MG tablet allopurinol (ZYLOPRIM) 100 MG tablet   Has the patient contacted their pharmacy? Yes (Agent: If no, request that the patient contact the pharmacy for the refill. If patient does not wish to contact the pharmacy document the reason why and proceed with request.) (Agent: If yes, when and what did the pharmacy advise?) said to call us   This is the patient's preferred pharmacy:  [facility] - [place], [place] - [address] [place] [address] Phone: [phone] Fax: [phone]  Is this the correct pharmacy for this prescription? Yes If no, delete pharmacy and type the correct one.   Has the prescription been filled recently? No  Is the patient out of the medication? Yes  Has the patient been seen for an appointment in the last year OR does the patient have an upcoming appointment? Yes  Can we respond through MyChart? No  Agent: Please be advised that Rx refills may take up to 3 business days. We ask that you follow-up with your pharmacy.

## 2024-03-28 NOTE — Telephone Encounter (Signed)
Pt informed

## 2024-04-01 DIAGNOSIS — M47816 Spondylosis without myelopathy or radiculopathy, lumbar region: Secondary | ICD-10-CM | POA: Diagnosis not present

## 2024-04-02 ENCOUNTER — Other Ambulatory Visit: Payer: Self-pay | Admitting: Family Medicine

## 2024-04-05 ENCOUNTER — Other Ambulatory Visit: Payer: Self-pay | Admitting: Family Medicine

## 2024-04-05 MED ORDER — ALENDRONATE SODIUM 70 MG PO TABS: 70.0000 mg | ORAL_TABLET | ORAL | 0 refills | Status: DC

## 2024-04-05 NOTE — Telephone Encounter (Signed)
 Copied from CRM [id number]. Topic: Clinical - Medication Refill >> Apr 05, 2024 11:52 AM Marissa P wrote: Medication: alendronate  (FOSAMAX ) 70 MG tablet  Has the patient contacted their pharmacy? Yes (Agent: If no, request that the patient contact the pharmacy for the refill. If patient does not wish to contact the pharmacy document the reason why and proceed with request.) (Agent: If yes, when and what did the pharmacy advise?)  This is the patient's preferred pharmacy:  [facility] - [place], [place] - [address] [place] [address] Phone: [phone] Fax: [phone]  Is this the correct pharmacy for this prescription? Yes If no, delete pharmacy and type the correct one.   Has the prescription been filled recently? Yes  Is the patient out of the medication? No  Has the patient been seen for an appointment in the last year OR does the patient have an upcoming appointment? Yes  Can we respond through MyChart? Yes  Agent: Please be advised that Rx refills may take up to 3 business days. We ask that you follow-up with your pharmacy.

## 2024-04-05 NOTE — Telephone Encounter (Signed)
 Copied from CRM [id number]. Topic: Clinical - Medication Refill >> Apr 05, 2024 10:42 AM Sophia H wrote: Medication: alendronate  (FOSAMAX ) 70 MG tablet **Refill request sent in on 08/07, patient still has not received.  Has the patient contacted their pharmacy? Yes, pharmacy states they never heard back from clinic on refill request.   This is the patient's preferred pharmacy:  [facility] - [place], [place] - [address] [place] [address] Phone: [phone] Fax: [phone]  Is this the correct pharmacy for this prescription? Yes If no, delete pharmacy and type the correct one.   Has the prescription been filled recently? Yes  Is the patient out of the medication? Yes  Has the patient been seen for an appointment in the last year OR does the patient have an upcoming appointment? Yes, seen June 20th   Can we respond through MyChart? No, prefers phone call.   Agent: Please be advised that Rx refills may take up to 3 business days. We ask that you follow-up with your pharmacy.

## 2024-04-12 DIAGNOSIS — R809 Proteinuria, unspecified: Secondary | ICD-10-CM | POA: Diagnosis not present

## 2024-04-12 DIAGNOSIS — N189 Chronic kidney disease, unspecified: Secondary | ICD-10-CM | POA: Diagnosis not present

## 2024-04-12 DIAGNOSIS — D631 Anemia in chronic kidney disease: Secondary | ICD-10-CM | POA: Diagnosis not present

## 2024-04-12 DIAGNOSIS — I10 Essential (primary) hypertension: Secondary | ICD-10-CM | POA: Diagnosis not present

## 2024-04-12 DIAGNOSIS — E211 Secondary hyperparathyroidism, not elsewhere classified: Secondary | ICD-10-CM | POA: Diagnosis not present

## 2024-04-12 DIAGNOSIS — E559 Vitamin D deficiency, unspecified: Secondary | ICD-10-CM | POA: Diagnosis not present

## 2024-04-15 DIAGNOSIS — D638 Anemia in other chronic diseases classified elsewhere: Secondary | ICD-10-CM | POA: Diagnosis not present

## 2024-04-15 DIAGNOSIS — E1122 Type 2 diabetes mellitus with diabetic chronic kidney disease: Secondary | ICD-10-CM | POA: Diagnosis not present

## 2024-04-15 DIAGNOSIS — I5042 Chronic combined systolic (congestive) and diastolic (congestive) heart failure: Secondary | ICD-10-CM | POA: Diagnosis not present

## 2024-04-15 DIAGNOSIS — N1832 Chronic kidney disease, stage 3b: Secondary | ICD-10-CM | POA: Diagnosis not present

## 2024-04-19 ENCOUNTER — Other Ambulatory Visit: Payer: Self-pay | Admitting: Family Medicine

## 2024-04-19 DIAGNOSIS — E1122 Type 2 diabetes mellitus with diabetic chronic kidney disease: Secondary | ICD-10-CM

## 2024-04-22 ENCOUNTER — Ambulatory Visit: Payer: PPO

## 2024-04-22 DIAGNOSIS — I5020 Unspecified systolic (congestive) heart failure: Secondary | ICD-10-CM

## 2024-04-23 LAB — CUP PACEART REMOTE DEVICE CHECK
Battery Remaining Longevity: 43 mo
Battery Voltage: 2.96 V
Brady Statistic AP VP Percent: 2.73 %
Brady Statistic AP VS Percent: 0.09 %
Brady Statistic AS VP Percent: 95.8 %
Brady Statistic AS VS Percent: 1.38 %
Brady Statistic RA Percent Paced: 2.82 %
Brady Statistic RV Percent Paced: 0.16 %
Date Time Interrogation Session: 20250909014230
HighPow Impedance: 66 Ohm
Implantable Lead Connection Status: 753985
Implantable Lead Connection Status: 753985
Implantable Lead Connection Status: 753985
Implantable Lead Implant Date: 20210315
Implantable Lead Implant Date: 20210315
Implantable Lead Implant Date: 20210315
Implantable Lead Location: 753858
Implantable Lead Location: 753859
Implantable Lead Location: 753860
Implantable Lead Model: 4598
Implantable Lead Model: 5076
Implantable Lead Model: 6935
Implantable Pulse Generator Implant Date: 20210315
Lead Channel Impedance Value: 1026 Ohm
Lead Channel Impedance Value: 1121 Ohm
Lead Channel Impedance Value: 201.488
Lead Channel Impedance Value: 215.064
Lead Channel Impedance Value: 233.981
Lead Channel Impedance Value: 270.508
Lead Channel Impedance Value: 295.556
Lead Channel Impedance Value: 361 Ohm
Lead Channel Impedance Value: 361 Ohm
Lead Channel Impedance Value: 361 Ohm
Lead Channel Impedance Value: 456 Ohm
Lead Channel Impedance Value: 456 Ohm
Lead Channel Impedance Value: 532 Ohm
Lead Channel Impedance Value: 646 Ohm
Lead Channel Impedance Value: 665 Ohm
Lead Channel Impedance Value: 703 Ohm
Lead Channel Impedance Value: 760 Ohm
Lead Channel Impedance Value: 950 Ohm
Lead Channel Pacing Threshold Amplitude: 0.5 V
Lead Channel Pacing Threshold Amplitude: 0.625 V
Lead Channel Pacing Threshold Amplitude: 0.875 V
Lead Channel Pacing Threshold Pulse Width: 0.4 ms
Lead Channel Pacing Threshold Pulse Width: 0.4 ms
Lead Channel Pacing Threshold Pulse Width: 0.4 ms
Lead Channel Sensing Intrinsic Amplitude: 14.75 mV
Lead Channel Sensing Intrinsic Amplitude: 14.75 mV
Lead Channel Sensing Intrinsic Amplitude: 3.25 mV
Lead Channel Sensing Intrinsic Amplitude: 3.25 mV
Lead Channel Setting Pacing Amplitude: 1.5 V
Lead Channel Setting Pacing Amplitude: 1.5 V
Lead Channel Setting Pacing Amplitude: 2 V
Lead Channel Setting Pacing Pulse Width: 0.4 ms
Lead Channel Setting Pacing Pulse Width: 0.4 ms
Lead Channel Setting Sensing Sensitivity: 0.3 mV
Zone Setting Status: 755011
Zone Setting Status: 755011

## 2024-04-29 DIAGNOSIS — M47816 Spondylosis without myelopathy or radiculopathy, lumbar region: Secondary | ICD-10-CM | POA: Diagnosis not present

## 2024-04-30 ENCOUNTER — Ambulatory Visit: Payer: Self-pay | Admitting: Internal Medicine

## 2024-05-02 NOTE — Progress Notes (Signed)
Remote ICD Transmission.

## 2024-05-06 ENCOUNTER — Other Ambulatory Visit: Payer: Self-pay | Admitting: Family Medicine

## 2024-05-13 ENCOUNTER — Encounter: Payer: Self-pay | Admitting: Cardiology

## 2024-05-13 ENCOUNTER — Ambulatory Visit: Attending: Surgery | Admitting: Cardiology

## 2024-05-13 VITALS — BP 132/74 | HR 75 | Ht 66.0 in | Wt 190.2 lb

## 2024-05-13 DIAGNOSIS — Z952 Presence of prosthetic heart valve: Secondary | ICD-10-CM | POA: Insufficient documentation

## 2024-05-13 DIAGNOSIS — I5020 Unspecified systolic (congestive) heart failure: Secondary | ICD-10-CM | POA: Insufficient documentation

## 2024-05-13 DIAGNOSIS — Z95810 Presence of automatic (implantable) cardiac defibrillator: Secondary | ICD-10-CM | POA: Diagnosis not present

## 2024-05-13 NOTE — Patient Instructions (Signed)
 Medication Instructions:  Your physician recommends that you continue on your current medications as directed. Please refer to the Current Medication list given to you today.   Labwork: None today  Testing/Procedures: None today  Follow-Up: 6 months  Any Other Special Instructions Will Be Listed Below (If Applicable).  If you need a refill on your cardiac medications before your next appointment, please call your pharmacy.

## 2024-05-13 NOTE — Progress Notes (Signed)
 Cardiology Office Note  Date: 05/13/2024   ID: SAIDE LANUZA, DOB 1945/09/28, MRN [id number]  History of Present Illness: [name] [name] is a 78 y.o. female last seen in March.  She is here today with her son for a follow-up visit.  She reports NYHA class II dyspnea, intermittent dependent ankle edema.  She is limited by arthritis in her knees and back, uses a cane to ambulate.  She is following with an orthopedist in [place].  We went over her cardiac medications which are unchanged including current diuretic plan.  She continues to follow with nephrology.  I reviewed her interval lab work and echocardiogram.  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 Waddell.  Recent device interrogation indicated normal function.  Physical Exam: VS:  BP 132/74 (BP Location: Left Arm, Cuff Size: Normal)   Pulse 75   Ht 5' 6 (1.676 m)   Wt 190 lb 3.2 oz (86.3 kg)   SpO2 95%   BMI 30.70 kg/m , BMI Body mass index is 30.7 kg/m.  Wt Readings from Last 3 Encounters:  05/13/24 190 lb 3.2 oz (86.3 kg)  02/02/24 187 lb (84.8 kg)  11/08/23 189 lb (85.7 kg)    General: Patient appears comfortable at rest. HEENT: Conjunctiva and lids normal. Neck: Supple, no elevated JVP or carotid bruits. Lungs: Clear to auscultation, nonlabored breathing at rest. Cardiac: RRR with 3/6 systolic murmur, no gallop. Extremities: No pitting edema.  ECG:  An ECG dated 10/21/2023 was personally reviewed today and demonstrated:  Sinus rhythm with ventricular pacing.  Labwork: 10/21/2023: B Natriuretic Peptide 289.0 10/27/2023: ALT 24; AST 30; BUN 72; Creatinine, Ser 2.65; Potassium 5.1; Sodium 141 02/02/2024: Hemoglobin 11.8; Platelets 138; TSH 2.340     Component Value Date/Time   CHOL 146 10/27/2023 1157   TRIG 118 10/27/2023 1157   HDL 43 10/27/2023 1157   CHOLHDL 3.0 05/11/2023 0911   CHOLHDL 2.9 08/20/2019 1001   VLDL 21 02/06/2017 0916   LDLCALC 82 10/27/2023 1157   LDLCALC 94 08/20/2019 1001    Other Studies Reviewed Today:  Echocardiogram 11/13/2023:  1. Left ventricular ejection fraction, by estimation, is 25 to 30%. The  left ventricle has severely decreased function. Left ventricular  endocardial border not optimally defined to evaluate regional wall motion.  The left ventricular internal cavity size   was mildly to moderately dilated. Left ventricular diastolic parameters  are consistent with Grade I diastolic dysfunction (impaired relaxation).  Elevated left ventricular end-diastolic pressure.   2. Right ventricular systolic function is normal. The right ventricular  size is normal. There is mildly elevated pulmonary artery systolic  pressure. The estimated right ventricular systolic pressure is 41.2 mmHg.   3. Left atrial size was mildly dilated.   4. Right atrial size was mild to moderately dilated.   5. The mitral valve is grossly normal. Trivial mitral valve  regurgitation. No evidence of mitral stenosis.   6. The aortic valve has been repaired/replaced. Aortic valve  regurgitation is not visualized. No aortic stenosis is present. There is a  23 mm Sapien prosthetic (TAVR) valve present in the aortic position.  Aortic valve mean gradient measures 17.0 mmHg.   7. The inferior vena cava is normal in size with greater than 50%  respiratory variability, suggesting right atrial pressure of 3 mmHg.   Assessment and Plan:  1.  HFrEF with nonischemic cardiomyopathy, LVEF 25 to 30% and RV contraction normal by echocardiogram in March.  Reports NYHA class II  dyspnea with typical activity, intermittent dependent ankle edema.  Her weight is stable.  Continue Coreg  25 mg twice daily, Entresto  24/26 mg twice daily, Aldactone  12.5 mg daily, Lasix  40 mg with potassium supplement every other day.   2.  History of severe aortic stenosis status post TAVR in 2016, 23 mm SAPIEN THV.  Follow-up echocardiogram in March showed mean AV gradient 17 mmHg and no aortic regurgitation.  Continue  aspirin  81 mg daily.   3.  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 Waddell.  Recent device interrogation indicated normal function.   4.  CKD stage IV, creatinine down to 1.75 with GFR 29 in March.  Continue to follow with nephrology.   5.  Primary hypertension.  No changes made to present regimen.  Disposition:  Follow up 6 months.  Signed, Jayson JUDITHANN Sierras, M.D., F.A.C.C. [facility]

## 2024-05-15 ENCOUNTER — Other Ambulatory Visit: Payer: Self-pay

## 2024-05-15 MED ORDER — LITETOUCH PEN NEEDLES 31G X 8 MM MISC: 1.0000 | Freq: Two times a day (BID) | 0 refills | Status: DC

## 2024-05-15 MED ORDER — POTASSIUM CHLORIDE ER 10 MEQ PO TBCR: EXTENDED_RELEASE_TABLET | ORAL | 0 refills | Status: DC

## 2024-05-17 ENCOUNTER — Other Ambulatory Visit: Payer: Self-pay | Admitting: Family Medicine

## 2024-05-23 DIAGNOSIS — M47816 Spondylosis without myelopathy or radiculopathy, lumbar region: Secondary | ICD-10-CM | POA: Diagnosis not present

## 2024-05-25 ENCOUNTER — Other Ambulatory Visit: Payer: Self-pay | Admitting: Cardiology

## 2024-05-25 ENCOUNTER — Other Ambulatory Visit: Payer: Self-pay | Admitting: Family Medicine

## 2024-05-25 DIAGNOSIS — I129 Hypertensive chronic kidney disease with stage 1 through stage 4 chronic kidney disease, or unspecified chronic kidney disease: Secondary | ICD-10-CM

## 2024-05-28 ENCOUNTER — Other Ambulatory Visit: Payer: Self-pay | Admitting: Family Medicine

## 2024-05-28 NOTE — Telephone Encounter (Signed)
 Copied from CRM [id number]. Topic: Clinical - Medication Refill >> May 28, 2024  3:22 PM Avram MATSU wrote: Medication: Insulin  Pen Needle (LITETOUCH PEN NEEDLES) 31G X 8 MM MISC [[id number]]  Has the patient contacted their pharmacy? Yes (Agent: If no, request that the patient contact the pharmacy for the refill. If patient does not wish to contact the pharmacy document the reason why and proceed with request.) (Agent: If yes, when and what did the pharmacy advise?)  This is the patient's preferred pharmacy:  [facility] - [place], [place] - [address] [place] [address] Phone: [phone] Fax: [phone]  Is this the correct pharmacy for this prescription? Yes If no, delete pharmacy and type the correct one.   Has the prescription been filled recently? No  Is the patient out of the medication? Yes  Has the patient been seen for an appointment in the last year OR does the patient have an upcoming appointment? Yes  Can we respond through MyChart? No  Agent: Please be advised that Rx refills may take up to 3 business days. We ask that you follow-up with your pharmacy.

## 2024-06-10 ENCOUNTER — Other Ambulatory Visit: Payer: Self-pay | Admitting: Family Medicine

## 2024-06-13 ENCOUNTER — Other Ambulatory Visit: Payer: Self-pay | Admitting: Family Medicine

## 2024-06-21 ENCOUNTER — Ambulatory Visit: Admitting: Family Medicine

## 2024-06-21 VITALS — BP 129/76 | HR 60 | Ht 66.0 in | Wt 188.0 lb

## 2024-06-21 DIAGNOSIS — I5042 Chronic combined systolic (congestive) and diastolic (congestive) heart failure: Secondary | ICD-10-CM | POA: Diagnosis not present

## 2024-06-21 DIAGNOSIS — I10 Essential (primary) hypertension: Secondary | ICD-10-CM

## 2024-06-21 DIAGNOSIS — M170 Bilateral primary osteoarthritis of knee: Secondary | ICD-10-CM | POA: Diagnosis not present

## 2024-06-21 DIAGNOSIS — E1159 Type 2 diabetes mellitus with other circulatory complications: Secondary | ICD-10-CM

## 2024-06-21 DIAGNOSIS — M5410 Radiculopathy, site unspecified: Secondary | ICD-10-CM

## 2024-06-21 DIAGNOSIS — E785 Hyperlipidemia, unspecified: Secondary | ICD-10-CM

## 2024-06-21 NOTE — Patient Instructions (Addendum)
 F/U in 14 weeks  Nurse please send for diabetic eye exam from Dr Darroll done in 2025  Nurse visit for flu vaccine 06/27/2024 pls schedule  STOP GLIPIZIDE  and continue insulin  as before please  Labs today  Please get shingles vaccines at your pharmacy  Nurse please order sleeves for both knees dx is arthritis in knees and send to [facility]  Please continue seeing Specialist about your pain  Thanks for choosing [facility], we consider it a privelige to serve you.

## 2024-06-21 NOTE — Assessment & Plan Note (Signed)
 Hyperlipidemia:Low fat diet discussed and encouraged.   Lipid Panel  Lab Results  Component Value Date   CHOL 146 10/27/2023   HDL 43 10/27/2023   LDLCALC 82 10/27/2023   TRIG 118 10/27/2023   CHOLHDL 3.0 05/11/2023     Updated lab needed at/ before next visit.

## 2024-06-21 NOTE — Assessment & Plan Note (Signed)
 Stop glipizide  Diabetes associated with hypertension, hyperlipidemia, and arthritis  [name] [name] is reminded of the importance of commitment to daily physical activity for 30 minutes or more, as able and the need to limit carbohydrate intake to 30 to 60 grams per meal to help with blood sugar control.   The need to take medication as prescribed, test blood sugar as directed, and to call between visits if there is a concern that blood sugar is uncontrolled is also discussed.   [name] [name] is reminded of the importance of daily foot exam, annual eye examination, and good blood sugar, blood pressure and cholesterol control.     Latest Ref Rng & Units 02/02/2024   10:58 AM 10/27/2023   11:57 AM 10/21/2023   12:40 PM 08/30/2023    2:02 PM 05/11/2023    9:11 AM  Diabetic Labs  HbA1c 4.8 - 5.6 % 6.6  7.0   7.2  7.0   Micro/Creat Ratio 0 - 29 mg/g creat 6       Chol 100 - 199 mg/dL  853    829   HDL >60 mg/dL  43    56   Calc LDL 0 - 99 mg/dL  82    94   Triglycerides 0 - 149 mg/dL  881    889   Creatinine 0.57 - 1.00 mg/dL  7.34  8.24   7.95       06/21/2024   10:07 AM 05/13/2024   11:39 AM 02/02/2024   10:09 AM 11/08/2023    8:24 AM 10/27/2023   11:11 AM 10/21/2023    4:25 PM 10/21/2023    4:05 PM  BP/Weight  Systolic BP 129 132 124 122 120 114 114  Diastolic BP 76 74 70 70 80 66 63  Wt. (Lbs) 188.04 190.2 187 189 185    BMI 30.35 kg/m2 30.7 kg/m2 30.18 kg/m2 30.51 kg/m2 29.86 kg/m2        Latest Ref Rng & Units 08/30/2023    1:00 PM 10/05/2022   12:00 AM  Foot/eye exam completion dates  Eye Exam No Retinopathy  No Retinopathy      Foot Form Completion  Done      This result is from an external source.      Updated lab needed at/ before next visit.

## 2024-06-22 ENCOUNTER — Ambulatory Visit: Payer: Self-pay | Admitting: Family Medicine

## 2024-06-22 LAB — CMP14+EGFR
ALT: 28 IU/L (ref 0–32)
AST: 27 IU/L (ref 0–40)
Albumin: 4.2 g/dL (ref 3.8–4.8)
Alkaline Phosphatase: 32 IU/L — ABNORMAL LOW (ref 49–135)
BUN/Creatinine Ratio: 20 (ref 12–28)
BUN: 34 mg/dL — ABNORMAL HIGH (ref 8–27)
Bilirubin Total: 0.4 mg/dL (ref 0.0–1.2)
CO2: 22 mmol/L (ref 20–29)
Calcium: 9.9 mg/dL (ref 8.7–10.3)
Chloride: 108 mmol/L — ABNORMAL HIGH (ref 96–106)
Creatinine, Ser: 1.68 mg/dL — ABNORMAL HIGH (ref 0.57–1.00)
Globulin, Total: 1.9 g/dL (ref 1.5–4.5)
Glucose: 96 mg/dL (ref 70–99)
Potassium: 4.8 mmol/L (ref 3.5–5.2)
Sodium: 141 mmol/L (ref 134–144)
Total Protein: 6.1 g/dL (ref 6.0–8.5)
eGFR: 31 mL/min/1.73 — ABNORMAL LOW (ref >59)

## 2024-06-22 LAB — LIPID PANEL W/O CHOL/HDL RATIO
Cholesterol, Total: 147 mg/dL (ref 100–199)
HDL: 54 mg/dL (ref >39)
LDL Chol Calc (NIH): 80 mg/dL (ref 0–99)
Triglycerides: 61 mg/dL (ref 0–149)
VLDL Cholesterol Cal: 13 mg/dL (ref 5–40)

## 2024-06-22 LAB — HEMOGLOBIN A1C
Est. average glucose Bld gHb Est-mCnc: 151 mg/dL
Hgb A1c MFr Bld: 6.9 % — ABNORMAL HIGH (ref 4.8–5.6)

## 2024-06-24 ENCOUNTER — Other Ambulatory Visit: Payer: Self-pay | Admitting: Family Medicine

## 2024-06-27 ENCOUNTER — Ambulatory Visit

## 2024-06-28 ENCOUNTER — Telehealth: Payer: Self-pay

## 2024-06-28 ENCOUNTER — Telehealth: Payer: Self-pay | Admitting: Family Medicine

## 2024-06-28 ENCOUNTER — Other Ambulatory Visit: Payer: Self-pay | Admitting: Family Medicine

## 2024-06-28 MED ORDER — PITAVASTATIN CALCIUM 2 MG PO TABS: ORAL_TABLET | ORAL | 5 refills | Status: DC

## 2024-06-28 NOTE — Telephone Encounter (Signed)
 Called pt, she has resolved this with pharmacy

## 2024-06-28 NOTE — Telephone Encounter (Unsigned)
 Copied from CRM #[id number]. Topic: Clinical - Prescription Issue >> Jun 28, 2024  9:21 AM Tiffini S wrote: Reason for CRM: Patient states that the pharmacy sent this medication Pitavastatin  Calcium  2 MG TABS for a 90 day supply/ said that the medicine was never received/ delivered- states that her medicines at lined up and she is very aware of her prescription/ she marks her medication.   Patient have 3 to 4 tablets left of the current prescription and will be out soon  This is the first time this has happened- she did not sign for the medication   Please call the patient back at [phone] to discuss this evening

## 2024-06-28 NOTE — Telephone Encounter (Signed)
 I printed the scrippt and it is in your area, for 30 days with refi;l, pls call pt and get it sorted out and fax after you speak with her, if she wants 49m day supply , pls re print for 90 dayas, I will do the reprint if you are unable just lety me know early in the afternoon , lv message on my chair, thanks

## 2024-06-28 NOTE — Telephone Encounter (Signed)
 Copied from CRM #[id number]. Topic: Clinical - Prescription Issue >> Jun 28, 2024  9:21 AM Tiffini S wrote: Reason for CRM: Patient states that the pharmacy sent this medication Pitavastatin  Calcium  2 MG TABS for a 90 day supply/ said that the medicine was never received/ delivered- states that her medicines at lined up and she is very aware of her prescription/ she marks her medication.   Patient have 3 to 4 tablets left of the current prescription and will be out soon  This is the first time this has happened- she did not sign for the medication   Please call the patient back at [phone] to discuss this evening

## 2024-07-02 ENCOUNTER — Ambulatory Visit: Payer: Self-pay

## 2024-07-07 ENCOUNTER — Encounter: Payer: Self-pay | Admitting: Family Medicine

## 2024-07-07 NOTE — Assessment & Plan Note (Signed)
 Increased pain , stiffness and instability, rx for sleeves/ to be sent to [facility]

## 2024-07-07 NOTE — Assessment & Plan Note (Signed)
 Controlled, no change in medication

## 2024-07-07 NOTE — Progress Notes (Signed)
 [name] [name]     MRN: [id number]      DOB: 09/08/1945  Chief Complaint  Patient presents with   Follow-up    Problems with arthritis pain in back    HPI [name] [name] is here for follow up and re-evaluation of chronic medical conditions, medication management and review of any available recent lab and radiology data.  Preventive health is updated, specifically  Cancer screening and Immunization.   Has had epidural injection, she thinks with some relief but not sure if she is returning, I encourage her to do so as her pain and back disease are real C/o blood sugar falling low esp at night and needing to eat to raise it , worse in past 1 month  C/o bilateral knee pain nad stiffness and instability requesting brace for the knes  ROS Denies recent fever or chills. Denies sinus pressure, nasal congestion, ear pain or sore throat. Denies chest congestion, productive cough or wheezing. Denies chest pains, palpitations and leg swelling Denies abdominal pain, nausea, vomiting,diarrhea or constipation.   Denies dysuria, frequency, hesitancy or incontinence. C/o back and bilateral knee pain with limitation in mobility. Denies headaches, seizures,c/o lower extremity  numbness, and  tingling. Denies depression, anxiety or insomnia. Denies skin break down or rash.   PE  BP 129/76   Pulse 60   Ht 5' 6 (1.676 m)   Wt 188 lb 0.6 oz (85.3 kg)   BMI 30.35 kg/m   Patient alert and oriented and in no cardiopulmonary distress.  HEENT: No facial asymmetry, EOMI,     Neck supple .  Chest: Clear to auscultation bilaterally.  CVS: S1, S2 systolic  murmur, no S3.Regular rate.  ABD: Soft non tender.   Ext: No edema  MS: decreased  ROM lumbar spine, shoulders, hips and knees.  Skin: Intact, no ulcerations or rash noted.  Psych: Good eye contact, normal affect. Memory intact not anxious or depressed appearing.  CNS: CN 2-12 intact, power,  normal throughout.no focal deficits  noted.   Assessment & Plan Type 2 diabetes mellitus with vascular disease (HCC) Stop glipizide  Diabetes associated with hypertension, hyperlipidemia, and arthritis  [name] [name] is reminded of the importance of commitment to daily physical activity for 30 minutes or more, as able and the need to limit carbohydrate intake to 30 to 60 grams per meal to help with blood sugar control.   The need to take medication as prescribed, test blood sugar as directed, and to call between visits if there is a concern that blood sugar is uncontrolled is also discussed.   [name] [name] is reminded of the importance of daily foot exam, annual eye examination, and good blood sugar, blood pressure and cholesterol control.     Latest Ref Rng & Units 02/02/2024   10:58 AM 10/27/2023   11:57 AM 10/21/2023   12:40 PM 08/30/2023    2:02 PM 05/11/2023    9:11 AM  Diabetic Labs  HbA1c 4.8 - 5.6 % 6.6  7.0   7.2  7.0   Micro/Creat Ratio 0 - 29 mg/g creat 6       Chol 100 - 199 mg/dL  853    829   HDL >60 mg/dL  43    56   Calc LDL 0 - 99 mg/dL  82    94   Triglycerides 0 - 149 mg/dL  881    889   Creatinine 0.57 - 1.00 mg/dL  7.34  8.24   7.95  06/21/2024   10:07 AM 05/13/2024   11:39 AM 02/02/2024   10:09 AM 11/08/2023    8:24 AM 10/27/2023   11:11 AM 10/21/2023    4:25 PM 10/21/2023    4:05 PM  BP/Weight  Systolic BP 129 132 124 122 120 114 114  Diastolic BP 76 74 70 70 80 66 63  Wt. (Lbs) 188.04 190.2 187 189 185    BMI 30.35 kg/m2 30.7 kg/m2 30.18 kg/m2 30.51 kg/m2 29.86 kg/m2        Latest Ref Rng & Units 08/30/2023    1:00 PM 10/05/2022   12:00 AM  Foot/eye exam completion dates  Eye Exam No Retinopathy  No Retinopathy      Foot Form Completion  Done      This result is from an external source.      Updated lab needed at/ before next visit.   Hyperlipidemia LDL goal <70 Hyperlipidemia:Low fat diet discussed and encouraged.   Lipid Panel  Lab Results  Component Value Date   CHOL 146  10/27/2023   HDL 43 10/27/2023   LDLCALC 82 10/27/2023   TRIG 118 10/27/2023   CHOLHDL 3.0 05/11/2023     Updated lab needed at/ before next visit.   Benign essential hypertension Controlled, no change in medication   Osteoarthritis of both knees Increased pain , stiffness and instability, rx for sleeves/ to be sent to [facility]  Chronic combined systolic and diastolic heart failure (HCC) Clinically stable currently, no s/s of decompensation, followed by Cardiology  Back pain with radiculopathy Still c/o significant pain, she is encouraged to continue epidural injections due to significant pathology in her spine

## 2024-07-07 NOTE — Assessment & Plan Note (Signed)
 Still c/o significant pain, she is encouraged to continue epidural injections due to significant pathology in her spine

## 2024-07-07 NOTE — Assessment & Plan Note (Signed)
 Clinically stable currently, no s/s of decompensation, followed by Cardiology

## 2024-07-08 ENCOUNTER — Other Ambulatory Visit: Payer: Self-pay

## 2024-07-08 DIAGNOSIS — M170 Bilateral primary osteoarthritis of knee: Secondary | ICD-10-CM

## 2024-07-08 MED ORDER — UNABLE TO FIND: 0 refills | Status: AC

## 2024-07-18 ENCOUNTER — Other Ambulatory Visit: Payer: Self-pay | Admitting: Orthopedic Surgery

## 2024-07-18 DIAGNOSIS — M1A0610 Idiopathic chronic gout, right knee, without tophus (tophi): Secondary | ICD-10-CM

## 2024-07-22 ENCOUNTER — Telehealth: Payer: Self-pay | Admitting: Family Medicine

## 2024-07-22 ENCOUNTER — Ambulatory Visit: Payer: PPO

## 2024-07-22 NOTE — Telephone Encounter (Deleted)
 Copied from CRM [id number]. Topic: Clinical - Medication Refill >> Jul 22, 2024  3:29 PM Rachelle R wrote: Medication: potassium chloride  (KLOR-CON ) 10 MEQ tablet  Has the patient contacted their pharmacy? Yes, call dr  This is the patient's preferred pharmacy:  [facility] - [place], [place] - [address] [place] [address] Phone: [phone] Fax: [phone]  Is this the correct pharmacy for this prescription? Yes If no, delete pharmacy and type the correct one.   Has the prescription been filled recently? No  Is the patient out of the medication? Yes  Has the patient been seen for an appointment in the last year OR does the patient have an upcoming appointment? Yes  Can we respond through MyChart? No  Agent: Please be advised that Rx refills may take up to 3 business days. We ask that you follow-up with your pharmacy.

## 2024-07-22 NOTE — Telephone Encounter (Unsigned)
 Copied from CRM [id number]. Topic: Clinical - Medication Refill >> Jul 22, 2024  3:31 PM Rachelle R wrote: Medication: potassium chloride  (KLOR-CON ) 10 MEQ tablet  Has the patient contacted their pharmacy? Yes, call dr  This is the patient's preferred pharmacy:  [facility] - [place], [place] - [address] [place] [address] Phone: [phone] Fax: [phone]  Is this the correct pharmacy for this prescription? Yes If no, delete pharmacy and type the correct one.   Has the prescription been filled recently? No  Is the patient out of the medication? Yes  Has the patient been seen for an appointment in the last year OR does the patient have an upcoming appointment? Yes  Can we respond through MyChart? No  Agent: Please be advised that Rx refills may take up to 3 business days. We ask that you follow-up with your pharmacy.

## 2024-07-23 LAB — CUP PACEART REMOTE DEVICE CHECK
Battery Remaining Longevity: 37 mo
Battery Voltage: 2.95 V
Brady Statistic AP VP Percent: 1.41 %
Brady Statistic AP VS Percent: 0.07 %
Brady Statistic AS VP Percent: 97.14 %
Brady Statistic AS VS Percent: 1.38 %
Brady Statistic RA Percent Paced: 1.48 %
Brady Statistic RV Percent Paced: 0.18 %
Date Time Interrogation Session: 20251208082604
HighPow Impedance: 69 Ohm
Implantable Lead Connection Status: 753985
Implantable Lead Connection Status: 753985
Implantable Lead Connection Status: 753985
Implantable Lead Implant Date: 20210315
Implantable Lead Implant Date: 20210315
Implantable Lead Implant Date: 20210315
Implantable Lead Location: 753858
Implantable Lead Location: 753859
Implantable Lead Location: 753860
Implantable Lead Model: 4598
Implantable Lead Model: 5076
Implantable Lead Model: 6935
Implantable Pulse Generator Implant Date: 20210315
Lead Channel Impedance Value: 188.1 Ohm
Lead Channel Impedance Value: 195.429
Lead Channel Impedance Value: 216.367
Lead Channel Impedance Value: 244.491
Lead Channel Impedance Value: 257.018
Lead Channel Impedance Value: 304 Ohm
Lead Channel Impedance Value: 342 Ohm
Lead Channel Impedance Value: 361 Ohm
Lead Channel Impedance Value: 418 Ohm
Lead Channel Impedance Value: 418 Ohm
Lead Channel Impedance Value: 456 Ohm
Lead Channel Impedance Value: 589 Ohm
Lead Channel Impedance Value: 589 Ohm
Lead Channel Impedance Value: 646 Ohm
Lead Channel Impedance Value: 722 Ohm
Lead Channel Impedance Value: 836 Ohm
Lead Channel Impedance Value: 931 Ohm
Lead Channel Impedance Value: 931 Ohm
Lead Channel Pacing Threshold Amplitude: 0.625 V
Lead Channel Pacing Threshold Amplitude: 0.75 V
Lead Channel Pacing Threshold Amplitude: 0.875 V
Lead Channel Pacing Threshold Pulse Width: 0.4 ms
Lead Channel Pacing Threshold Pulse Width: 0.4 ms
Lead Channel Pacing Threshold Pulse Width: 0.4 ms
Lead Channel Sensing Intrinsic Amplitude: 15.625 mV
Lead Channel Sensing Intrinsic Amplitude: 15.625 mV
Lead Channel Sensing Intrinsic Amplitude: 3.375 mV
Lead Channel Sensing Intrinsic Amplitude: 3.375 mV
Lead Channel Setting Pacing Amplitude: 1.5 V
Lead Channel Setting Pacing Amplitude: 1.5 V
Lead Channel Setting Pacing Amplitude: 2 V
Lead Channel Setting Pacing Pulse Width: 0.4 ms
Lead Channel Setting Pacing Pulse Width: 0.4 ms
Lead Channel Setting Sensing Sensitivity: 0.3 mV
Zone Setting Status: 755011
Zone Setting Status: 755011

## 2024-07-23 MED ORDER — POTASSIUM CHLORIDE ER 10 MEQ PO TBCR: EXTENDED_RELEASE_TABLET | ORAL | 0 refills | Status: DC

## 2024-07-24 ENCOUNTER — Ambulatory Visit: Payer: Self-pay | Admitting: Internal Medicine

## 2024-07-24 DIAGNOSIS — M47816 Spondylosis without myelopathy or radiculopathy, lumbar region: Secondary | ICD-10-CM | POA: Diagnosis not present

## 2024-07-25 ENCOUNTER — Ambulatory Visit: Payer: Self-pay

## 2024-07-25 ENCOUNTER — Other Ambulatory Visit: Payer: Self-pay

## 2024-07-25 MED ORDER — LITETOUCH PEN NEEDLES 31G X 8 MM MISC: 1.0000 | Freq: Two times a day (BID) | 0 refills | Status: DC

## 2024-07-25 NOTE — Telephone Encounter (Signed)
 FYI Only or Action Required?: Action required by provider: request for appointment.  Patient was last seen in primary care on 06/21/2024 by Antonetta Rollene BRAVO, MD.  Called Nurse Triage reporting Back Pain.  Symptoms began several months ago.  Interventions attempted: OTC medications: tylenol .  Symptoms are: unchanged.  Triage Disposition: See PCP Within 2 Weeks  Patient/caregiver understands and will follow disposition?: Yes  Copied from CRM #[id number]. Topic: Clinical - Red Word Triage >> Jul 25, 2024  1:58 PM Antony RAMAN wrote: Red Word that prompted transfer to Nurse Triage: middle to upper pain in back Reason for Disposition  Back pain is a chronic symptom (recurrent or ongoing AND present > 4 weeks)  Answer Assessment - Initial Assessment Questions Has been seeing neurosurgeon but they said they can't keep giving her injections. They referred her back to PCP for treatment. No appointments available before patient's already booked appointment on 09/25/2023, but patient would like to be seen sooner. Patient upset and saying she feels like no one cares about her back pain. Assured patient we care and that I will send message requesting sooner appointment. UC/ER if needed.  1. ONSET: When did the pain begin? (e.g., minutes, hours, days)     August 2. LOCATION: Where does it hurt? (upper, mid or lower back)     Middle and lower 3. SEVERITY: How bad is the pain?  (e.g., Scale 1-10; mild, moderate, or severe)     6-7 4. PATTERN: Is the pain constant? (e.g., yes, no; constant, intermittent)      Comes and goes 5. RADIATION: Does the pain shoot into your legs or somewhere else?     Denies 6. CAUSE:  What do you think is causing the back pain?       8. MEDICINES: What have you taken so far for the pain? (e.g., nothing, acetaminophen , NSAIDS)     Tylenol  9. NEUROLOGIC SYMPTOMS: Do you have any weakness, numbness, or problems with bowel/bladder control?     Tinging in  legs 10. OTHER SYMPTOMS: Do you have any other symptoms? (e.g., fever, abdomen pain, burning with urination, blood in urine)      Patient having a lot of difficulty sleeping,  Protocols used: Back Pain-A-AH

## 2024-07-26 NOTE — Telephone Encounter (Signed)
 scheduled

## 2024-07-30 ENCOUNTER — Telehealth: Payer: Self-pay

## 2024-07-30 ENCOUNTER — Other Ambulatory Visit: Payer: Self-pay | Admitting: Internal Medicine

## 2024-07-30 NOTE — Progress Notes (Signed)
 Remote ICD Transmission

## 2024-07-30 NOTE — Telephone Encounter (Signed)
 Refill was sent to pharmacy.

## 2024-07-30 NOTE — Telephone Encounter (Signed)
 Copied from CRM #[id number]. Topic: Clinical - Medication Question >> Jul 30, 2024  2:31 PM Willma R wrote: Reason for CRM: Patient put in a refill for NOVOLOG  MIX 70/30 FLEXPEN (70-30) 100 UNIT/ML FlexPen today, is worried as she took the last of the insulin  this morning and doesn't have enough for this evening. Is inquiring on what she should do.  Patient can be reached at [phone]

## 2024-07-31 ENCOUNTER — Other Ambulatory Visit: Payer: Self-pay | Admitting: Family Medicine

## 2024-08-26 ENCOUNTER — Other Ambulatory Visit: Payer: Self-pay | Admitting: Internal Medicine

## 2024-08-26 ENCOUNTER — Ambulatory Visit: Payer: Self-pay

## 2024-08-26 DIAGNOSIS — E1122 Type 2 diabetes mellitus with diabetic chronic kidney disease: Secondary | ICD-10-CM

## 2024-08-26 NOTE — Telephone Encounter (Signed)
 FYI Only or Action Required?: FYI only for provider: appointment scheduled on 1/13.  Patient was last seen in primary care on 06/21/2024 by Antonetta Rollene BRAVO, MD.  Called Nurse Triage reporting Back Pain.  Symptoms began several months ago.  Interventions attempted: OTC medications: tylenol .  Symptoms are: unchanged.  Triage Disposition: See Physician Within 24 Hours  Patient/caregiver understands and will follow disposition?: Yes   Copied from CRM #[id number]. Topic: Clinical - Red Word Triage >> Aug 26, 2024  4:11 PM Winona R wrote: Bad back pain, numbness in legs Reason for Disposition  High-risk adult (e.g., history of cancer, HIV, or IV drug use)  Answer Assessment - Initial Assessment Questions Patient with chronic back pain unimproved with injections. States pain hasn't gotten worse.   1. ONSET: When did the pain begin? (e.g., minutes, hours, days)     chronic 2. LOCATION: Where does it hurt? (upper, mid or lower back)     Low back 3. SEVERITY: How bad is the pain?  (e.g., Scale 1-10; mild, moderate, or severe)     moderate 4. PATTERN: Is the pain constant? (e.g., yes, no; constant, intermittent)      constant 5. RADIATION: Does the pain shoot into your legs or somewhere else?     no 6. CAUSE:  What do you think is causing the back pain?       7. BACK OVERUSE:  Any recent lifting of heavy objects, strenuous work or exercise?     no 8. MEDICINES: What have you taken so far for the pain? (e.g., nothing, acetaminophen , NSAIDS)     tylenol  9. NEUROLOGIC SYMPTOMS: Do you have any weakness, numbness, or problems with bowel/bladder control?     burning rt leg 10. OTHER SYMPTOMS: Do you have any other symptoms? (e.g., fever, abdomen pain, burning with urination, blood in urine)       no  Protocols used: Back Pain-A-AH

## 2024-08-26 NOTE — Telephone Encounter (Signed)
Noted patient scheduled

## 2024-08-27 ENCOUNTER — Encounter: Payer: Self-pay | Admitting: Family Medicine

## 2024-08-27 ENCOUNTER — Ambulatory Visit: Payer: Self-pay | Admitting: Family Medicine

## 2024-08-27 VITALS — BP 104/63 | HR 73 | Resp 16 | Ht 66.0 in | Wt 186.0 lb

## 2024-08-27 DIAGNOSIS — I10 Essential (primary) hypertension: Secondary | ICD-10-CM

## 2024-08-27 DIAGNOSIS — I5042 Chronic combined systolic (congestive) and diastolic (congestive) heart failure: Secondary | ICD-10-CM

## 2024-08-27 DIAGNOSIS — M5410 Radiculopathy, site unspecified: Secondary | ICD-10-CM | POA: Diagnosis not present

## 2024-08-27 DIAGNOSIS — E785 Hyperlipidemia, unspecified: Secondary | ICD-10-CM | POA: Diagnosis not present

## 2024-08-27 DIAGNOSIS — I129 Hypertensive chronic kidney disease with stage 1 through stage 4 chronic kidney disease, or unspecified chronic kidney disease: Secondary | ICD-10-CM

## 2024-08-27 DIAGNOSIS — Z23 Encounter for immunization: Secondary | ICD-10-CM | POA: Diagnosis not present

## 2024-08-27 DIAGNOSIS — E1122 Type 2 diabetes mellitus with diabetic chronic kidney disease: Secondary | ICD-10-CM | POA: Diagnosis not present

## 2024-08-27 DIAGNOSIS — N184 Chronic kidney disease, stage 4 (severe): Secondary | ICD-10-CM

## 2024-08-27 NOTE — Patient Instructions (Addendum)
 Reschedule feb appointment to annual exam June 27 or after  Pls sched dexa at checkout  Fasting lipid, cmpand EGFr, hBA1C, tSH, vitd and CBC  and urine acr 6/21 or after but at least 3 days before appt   Flu vaccine today  No change in medications  Use ice, tylenol  and topical medication for pain as you are doing  Careful not to fall  Thanks for choosing [facility], we consider it a privelige to serve you.

## 2024-08-29 ENCOUNTER — Other Ambulatory Visit: Payer: Self-pay

## 2024-08-29 ENCOUNTER — Telehealth: Payer: Self-pay | Admitting: Family Medicine

## 2024-08-29 ENCOUNTER — Other Ambulatory Visit (HOSPITAL_COMMUNITY): Payer: Self-pay

## 2024-08-29 ENCOUNTER — Telehealth: Payer: Self-pay | Admitting: Pharmacy Technician

## 2024-08-29 DIAGNOSIS — E1122 Type 2 diabetes mellitus with diabetic chronic kidney disease: Secondary | ICD-10-CM

## 2024-08-29 MED ORDER — BLOOD GLUCOSE MONITORING SUPPL DEVI: 1.0000 | 0 refills | Status: AC

## 2024-08-29 MED ORDER — BLOOD GLUCOSE TEST VI STRP: 1.0000 | ORAL_STRIP | 5 refills | Status: AC

## 2024-08-29 MED ORDER — ACCU-CHEK SOFTCLIX LANCETS MISC: 12 refills | Status: AC

## 2024-08-29 MED ORDER — LANCETS MISC: 1.0000 | 5 refills | Status: AC

## 2024-08-29 MED ORDER — LANCET DEVICE MISC: 1.0000 | 0 refills | Status: AC

## 2024-08-29 NOTE — Telephone Encounter (Signed)
 Pharmacy Patient Advocate Encounter   Received notification from [facility] KEY that prior authorization for OneTouch Delica Plus Lancet is required/requested.   Insurance verification completed.   The patient is insured through [facility] ADVANTAGE/RX ADVANCE.   Per test claim:  Accucheck or Contour is preferred by the insurance.  If suggested medication is appropriate, Please send in a new RX and discontinue this one. If not, please advise as to why it's not appropriate so that we may request a Prior Authorization. Please note, some preferred medications may still require a PA.  If the suggested medications have not been trialed and there are no contraindications to their use, the PA will not be submitted, as it will not be approved. Archived Key: [id number]

## 2024-08-29 NOTE — Telephone Encounter (Signed)
 Sent for accuchek

## 2024-08-29 NOTE — Telephone Encounter (Signed)
 Copied from CRM #[id number]. Topic: Clinical - Medical Advice >> Aug 29, 2024  2:26 PM Montie POUR wrote: Reason for CRM:  [facility] needs order for Accu-chek meter and strips. They don't show she has one. Please send over today. Please call Melody at [phone] with questions.

## 2024-08-29 NOTE — Telephone Encounter (Signed)
 Sent!

## 2024-09-01 ENCOUNTER — Other Ambulatory Visit: Payer: Self-pay | Admitting: Family Medicine

## 2024-09-02 ENCOUNTER — Encounter: Payer: Self-pay | Admitting: Family Medicine

## 2024-09-02 DIAGNOSIS — Z23 Encounter for immunization: Secondary | ICD-10-CM | POA: Insufficient documentation

## 2024-09-02 NOTE — Assessment & Plan Note (Signed)
 Diabetes associated with hypertension, hyperlipidemia, CKD, and arthritis  Ms. Keleher is reminded of the importance of commitment to daily physical activity for 30 minutes or more, as able and the need to limit carbohydrate intake to 30 to 60 grams per meal to help with blood sugar control.   The need to take medication as prescribed, test blood sugar as directed, and to call between visits if there is a concern that blood sugar is uncontrolled is also discussed.   Ms. Boldon is reminded of the importance of daily foot exam, annual eye examination, and good blood sugar, blood pressure and cholesterol control.     Latest Ref Rng & Units 06/21/2024   10:54 AM 02/02/2024   10:58 AM 10/27/2023   11:57 AM 10/21/2023   12:40 PM 08/30/2023    2:02 PM  Diabetic Labs  HbA1c 4.8 - 5.6 % 6.9  6.6  7.0   7.2   Micro/Creat Ratio 0 - 29 mg/g creat  6      Chol 100 - 199 mg/dL 852   853     HDL >60 mg/dL 54   43     Calc LDL 0 - 99 mg/dL 80   82     Triglycerides 0 - 149 mg/dL 61   881     Creatinine 0.57 - 1.00 mg/dL 8.31   7.34  8.24        08/27/2024    2:31 PM 06/21/2024   10:07 AM 05/13/2024   11:39 AM 02/02/2024   10:09 AM 11/08/2023    8:24 AM 10/27/2023   11:11 AM 10/21/2023    4:25 PM  BP/Weight  Systolic BP 104 129 132 124 122 120 114  Diastolic BP 63 76 74 70 70 80 66  Wt. (Lbs) 186 188.04 190.2 187 189 185   BMI 30.02 kg/m2 30.35 kg/m2 30.7 kg/m2 30.18 kg/m2 30.51 kg/m2 29.86 kg/m2       Latest Ref Rng & Units 08/30/2023    1:00 PM 10/05/2022   12:00 AM  Foot/eye exam completion dates  Eye Exam No Retinopathy  No Retinopathy      Foot Form Completion  Done      This result is from an external source.      Controlled, no change in medication

## 2024-09-02 NOTE — Assessment & Plan Note (Signed)
 Managed conservatively  for the most part , with intermittent flares which are debilitating when they occur

## 2024-09-02 NOTE — Assessment & Plan Note (Signed)
 Controlled, no change in medication

## 2024-09-02 NOTE — Assessment & Plan Note (Signed)
 After obtaining informed consent, the influenza vaccine is  administered , with no adverse effect noted at the time of administration.

## 2024-09-02 NOTE — Assessment & Plan Note (Signed)
"   Stable, followed by Cardiology, no current s/s of decompensation "

## 2024-09-02 NOTE — Progress Notes (Signed)
 "  [name] [name]     MRN: [id number]      DOB: 10/17/1945  Chief Complaint  Patient presents with   Back Pain    States she hurts all in her back. Lower back and into her shoulder blades. Worse at night and it causes her to not be able to sleep without waking up. Has tried ice pack and heating pad and tylenol . Got a call from pain clinic to return their call to schedule appt     HPI [name] [name] is here for follow up and re-evaluation of chronic medical conditions, medication management and review of any available recent lab and radiology data.  Preventive health is updated, specifically  Cancer screening and Immunization.   Questions or concerns regarding consultations or procedures which the PT has had in the interim are  addressed.Will sched appt with pain clinic  The PT denies any adverse reactions to current medications since the last visit.  C/o uncontrolled back pain, icing, tylenol  and rubs, states medications do no really help Denies polyuria, polydipsia, blurred vision , or hypoglycemic episodes.   ROS Denies recent fever or chills. Denies sinus pressure, nasal congestion, ear pain or sore throat. Denies chest congestion, productive cough or wheezing. Denies chest pains, palpitations and leg swelling Denies abdominal pain, nausea, vomiting,diarrhea or constipation.   Denies dysuria, frequency, hesitancy or incontinence. Denies headaches, seizures, numbness, or tingling. Denies depression, anxiety or insomnia. Denies skin break down or rash.   PE  BP 104/63   Pulse 73   Resp 16   Ht 5' 6 (1.676 m)   Wt 186 lb (84.4 kg)   SpO2 97%   BMI 30.02 kg/m   Patient alert and oriented and in no cardiopulmonary distress.  HEENT: No facial asymmetry, EOMI,     Neck decreased ROM .  Chest: Clear to auscultation bilaterally.  CVS: S1, S2 systolic murmurs, no S3.Regular rate.  ABD: Soft non tender.   Ext: No edema  MS: decreased  ROM spine, shoulders, hips and  knees.  Skin: Intact, no ulcerations or rash noted.  Psych: Good eye contact, normal affect. Memory intact not anxious or depressed appearing.  CNS: CN 2-12 intact, power,  normal throughout.no focal deficits noted.   Assessment & Plan  Back pain with radiculopathy Managed conservatively  for the most part , with intermittent flares which are debilitating when they occur  Benign essential hypertension Controlled, no change in medication   Immunization due After obtaining informed consent, the influenza  vaccine is  administered , with no adverse effect noted at the time of administration.   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   The need to take medication as prescribed, test blood sugar as directed, and to call between visits if there is a concern that blood sugar is uncontrolled is also discussed.   [name] [name] is reminded of the importance of daily foot exam, annual eye examination, and good blood sugar, blood pressure and cholesterol control.     Latest Ref Rng & Units 06/21/2024   10:54 AM 02/02/2024   10:58 AM 10/27/2023   11:57 AM 10/21/2023   12:40 PM 08/30/2023    2:02 PM  Diabetic Labs  HbA1c 4.8 - 5.6 % 6.9  6.6  7.0   7.2   Micro/Creat Ratio 0 - 29 mg/g creat  6      Chol 100 - 199 mg/dL 852   853     HDL >60 mg/dL 54   43     Calc LDL 0 - 99 mg/dL 80   82     Triglycerides 0 - 149 mg/dL 61   881     Creatinine 0.57 - 1.00 mg/dL 8.31   7.34  8.24        08/27/2024    2:31 PM 06/21/2024   10:07 AM 05/13/2024   11:39 AM 02/02/2024   10:09 AM 11/08/2023    8:24 AM 10/27/2023   11:11 AM 10/21/2023    4:25 PM  BP/Weight  Systolic BP 104 129 132 124 122 120 114   Diastolic BP 63 76 74 70 70 80 66  Wt. (Lbs) 186 188.04 190.2 187 189 185   BMI 30.02 kg/m2 30.35 kg/m2 30.7 kg/m2 30.18 kg/m2 30.51 kg/m2 29.86 kg/m2       Latest Ref Rng & Units 08/30/2023    1:00 PM 10/05/2022   12:00 AM  Foot/eye exam completion dates  Eye Exam No Retinopathy  No Retinopathy      Foot Form Completion  Done      This result is from an external source.      Controlled, no change in medication   Chronic combined systolic and diastolic heart failure (HCC)  Stable, followed by Cardiology, no current s/s of decompensation  "

## 2024-09-05 ENCOUNTER — Inpatient Hospital Stay (HOSPITAL_COMMUNITY): Admission: RE | Admit: 2024-09-05 | Source: Ambulatory Visit

## 2024-09-08 ENCOUNTER — Other Ambulatory Visit: Payer: Self-pay | Admitting: Family Medicine

## 2024-09-11 ENCOUNTER — Other Ambulatory Visit: Payer: Self-pay | Admitting: Family Medicine

## 2024-09-24 ENCOUNTER — Encounter: Payer: PPO | Admitting: Family Medicine

## 2024-09-24 ENCOUNTER — Ambulatory Visit

## 2024-10-02 ENCOUNTER — Ambulatory Visit: Payer: Self-pay | Admitting: Family Medicine

## 2025-02-11 ENCOUNTER — Encounter: Payer: Self-pay | Admitting: Family Medicine
